# Patient Record
Sex: Female | Born: 1935 | Race: Black or African American | Hispanic: No | Smoking: Never smoker
Health system: Southern US, Community
[De-identification: ages and names within clinical notes are randomized; demographics above are authoritative.]

## PROBLEM LIST (undated history)

## (undated) DIAGNOSIS — G5603 Carpal tunnel syndrome, bilateral upper limbs: Secondary | ICD-10-CM

## (undated) DIAGNOSIS — E785 Hyperlipidemia, unspecified: Secondary | ICD-10-CM

## (undated) DIAGNOSIS — M1990 Unspecified osteoarthritis, unspecified site: Secondary | ICD-10-CM

## (undated) DIAGNOSIS — K5792 Diverticulitis of intestine, part unspecified, without perforation or abscess without bleeding: Secondary | ICD-10-CM

## (undated) DIAGNOSIS — N8110 Cystocele, unspecified: Secondary | ICD-10-CM

## (undated) DIAGNOSIS — E1142 Type 2 diabetes mellitus with diabetic polyneuropathy: Secondary | ICD-10-CM

## (undated) DIAGNOSIS — E119 Type 2 diabetes mellitus without complications: Secondary | ICD-10-CM

## (undated) DIAGNOSIS — I10 Essential (primary) hypertension: Secondary | ICD-10-CM

## (undated) HISTORY — PX: OTHER SURGICAL HISTORY: SHX169

## (undated) HISTORY — PX: REPLACEMENT TOTAL KNEE BILATERAL: SUR1225

## (undated) HISTORY — PX: VENTRAL HERNIA REPAIR: SHX424

---

## 2004-10-16 ENCOUNTER — Ambulatory Visit: Payer: Self-pay | Admitting: Unknown Physician Specialty

## 2005-06-28 ENCOUNTER — Ambulatory Visit: Payer: Self-pay | Admitting: Unknown Physician Specialty

## 2005-10-21 ENCOUNTER — Ambulatory Visit: Payer: Self-pay | Admitting: Unknown Physician Specialty

## 2006-10-27 ENCOUNTER — Ambulatory Visit: Payer: Self-pay | Admitting: Unknown Physician Specialty

## 2007-12-13 ENCOUNTER — Ambulatory Visit: Payer: Self-pay | Admitting: Unknown Physician Specialty

## 2008-01-03 ENCOUNTER — Ambulatory Visit: Payer: Self-pay | Admitting: Unknown Physician Specialty

## 2008-10-03 ENCOUNTER — Emergency Department: Payer: Self-pay | Admitting: Emergency Medicine

## 2008-10-11 ENCOUNTER — Ambulatory Visit: Payer: Self-pay | Admitting: Unknown Physician Specialty

## 2009-02-16 ENCOUNTER — Ambulatory Visit: Payer: Self-pay | Admitting: Unknown Physician Specialty

## 2009-11-05 ENCOUNTER — Ambulatory Visit: Payer: Self-pay | Admitting: General Practice

## 2009-11-21 ENCOUNTER — Inpatient Hospital Stay: Payer: Self-pay | Admitting: General Practice

## 2010-01-23 ENCOUNTER — Ambulatory Visit: Payer: Self-pay | Admitting: General Practice

## 2010-02-06 ENCOUNTER — Inpatient Hospital Stay: Payer: Self-pay | Admitting: General Practice

## 2010-05-08 ENCOUNTER — Ambulatory Visit: Payer: Self-pay | Admitting: Unknown Physician Specialty

## 2010-05-15 IMAGING — US US CAROTID DUPLEX BILAT
1 series · 17 of 24 positions shown · non-contrast
Comparison: none

REASON FOR EXAM: MRIat 930a  US at 6222a left facial numbness left hand
numbness  TIA
COMMENTS:

[Series 1: us carotid duplex bilat · 17 of 56 slices shown]
[im 1/56]
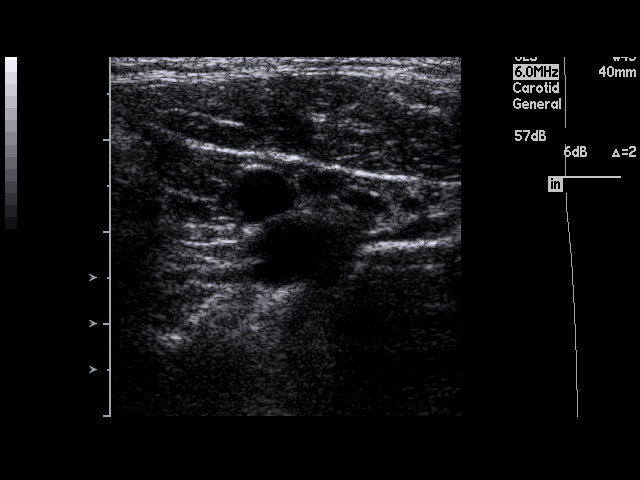
[im 5/56]
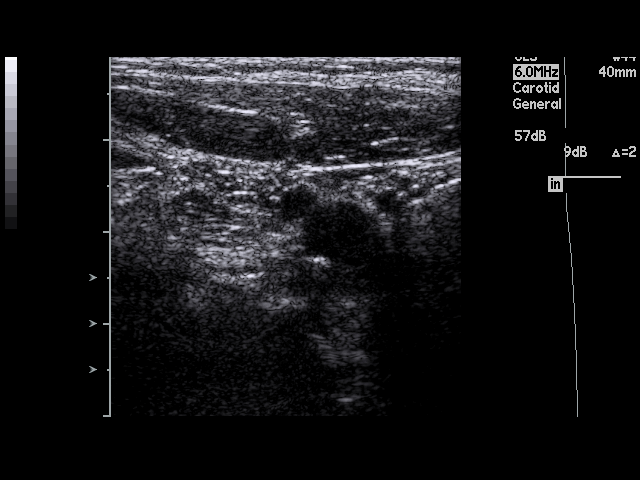
[im 8/56]
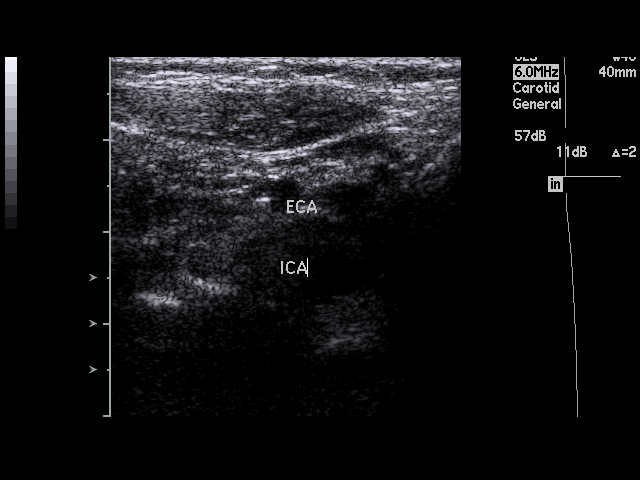
[im 10/56]
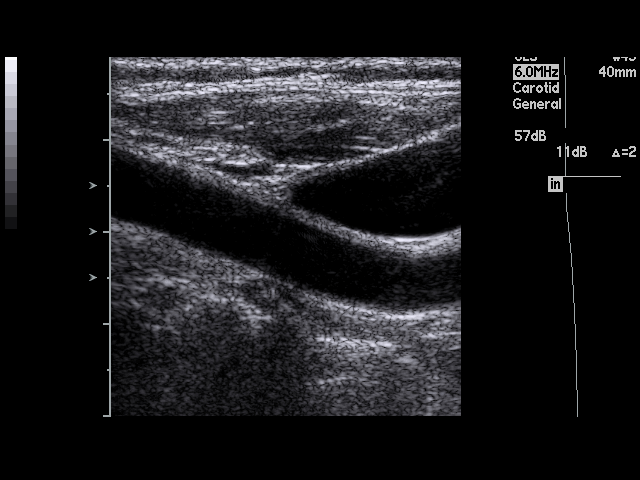
[im 15/56]
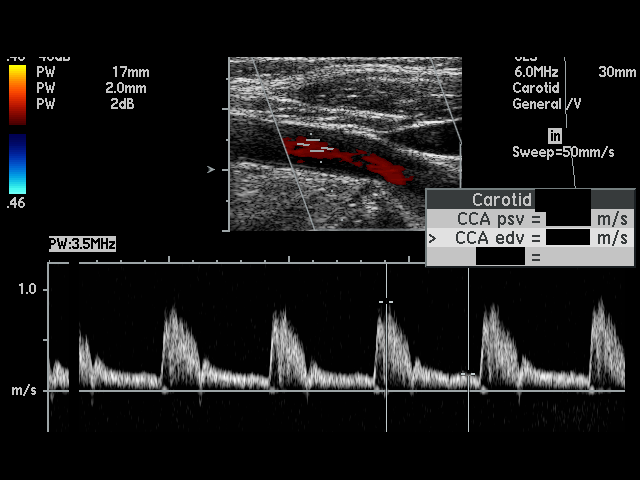
[im 17/56]
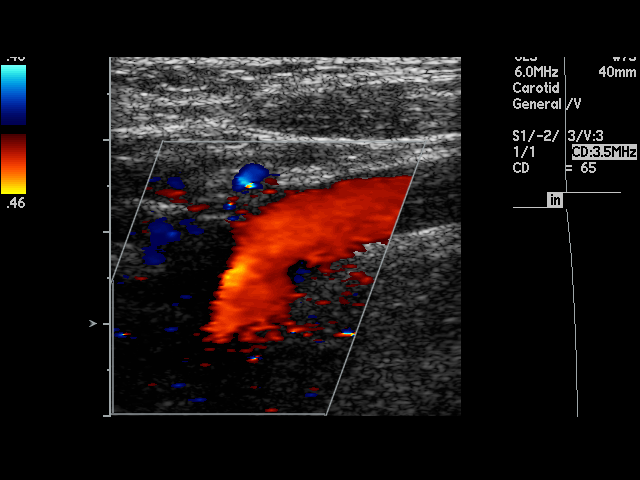
[im 22/56]
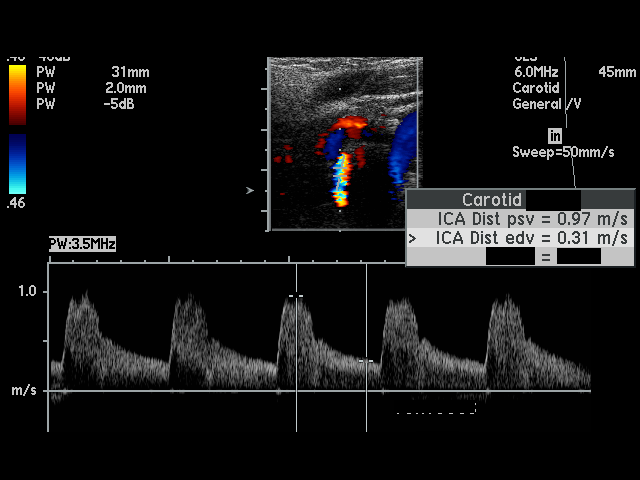
[im 24/56]
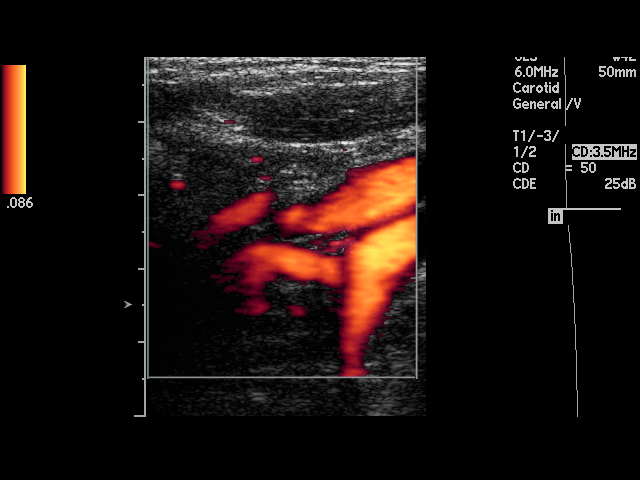
[im 29/56]
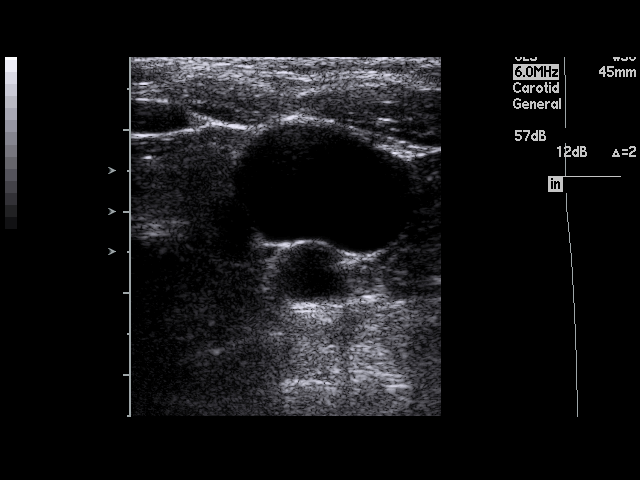
[im 32/56]
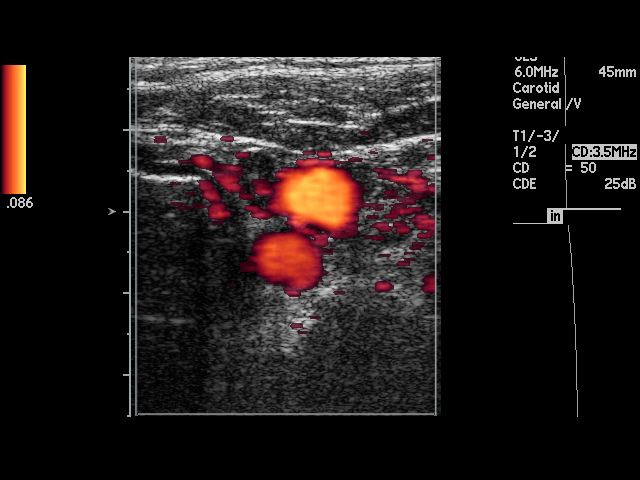
[im 34/56]
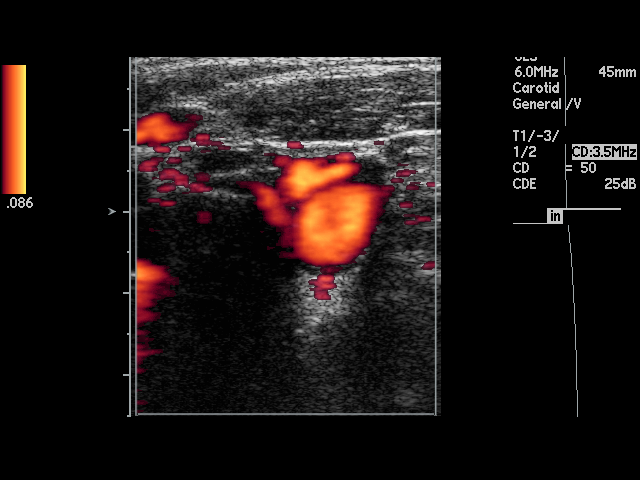
[im 39/56]
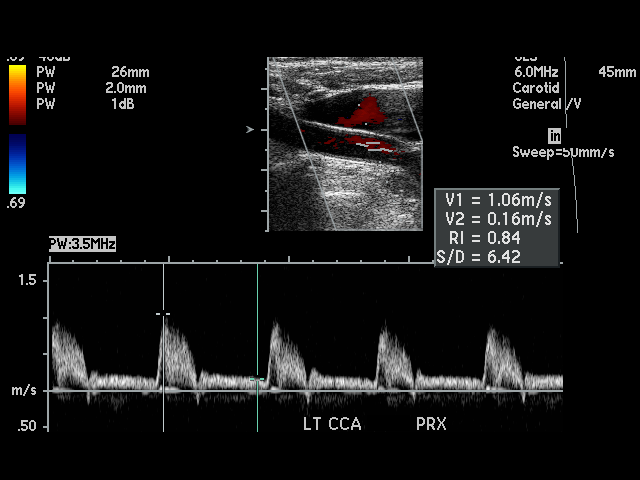
[im 41/56]
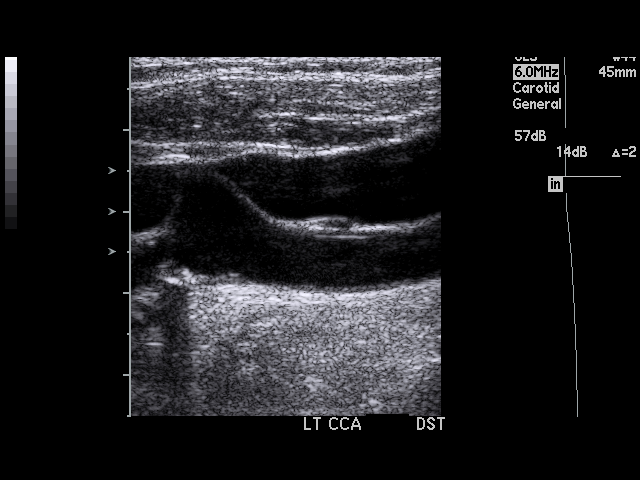
[im 46/56]
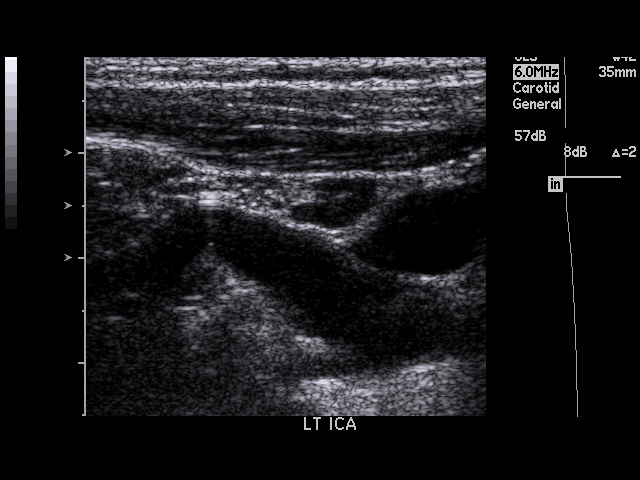
[im 48/56]
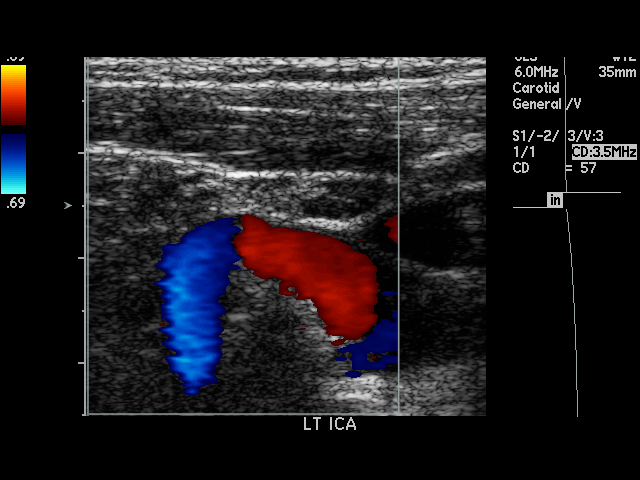
[im 51/56]
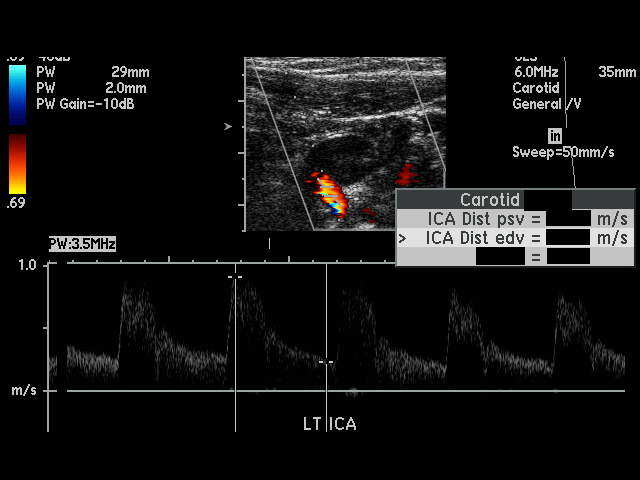
[im 56/56]
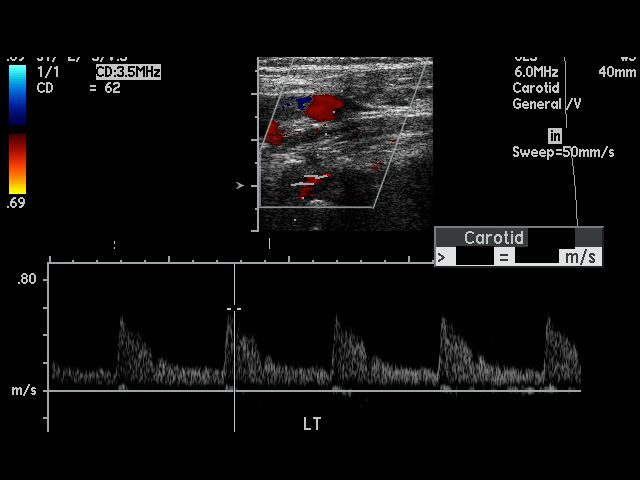

[17 of 24 positions shown; findings below may reference images not displayed]

PROCEDURE:     US  - US CAROTID DOPPLER BILATERAL  - October 11, 2008 [DATE]

RESULT:     There is observed mild plaque formation at the carotid
bifurcation on the right, primarily calcified plaque. On the left, there is
a small amount of mixed calcific and soft plaque formation at the carotid
bulb and proximal internal and external carotids.

There on the right, the peak right common carotid artery flow velocity
measures 0.887 meters/second and the peak right internal carotid artery flow
velocity measures 1.04 meters/second. ICA/CCA ratio measures 1.172. On the
left, the peak left common carotid artery flow velocity measures
meters/second and the peak left internal carotid artery flow velocity
measures are 0.913 meters/second. ICA/CCA ratio is 1.174. These values are
bilaterally consistent with the absence of hemodynamically significant
stenosis.

Antegrade flow is observed in both vertebrals.
IMPRESSION: 1. Mild plaque formation is seen bilaterally.
2. No hemodynamically significant stenosis is observed on either side.
3. There is antegrade flow in both vertebrals.

## 2010-06-20 ENCOUNTER — Emergency Department: Payer: Self-pay | Admitting: Emergency Medicine

## 2010-11-30 ENCOUNTER — Inpatient Hospital Stay: Payer: Self-pay | Admitting: Internal Medicine

## 2011-06-25 ENCOUNTER — Ambulatory Visit: Payer: Self-pay | Admitting: Unknown Physician Specialty

## 2012-11-25 ENCOUNTER — Ambulatory Visit: Payer: Self-pay | Admitting: Internal Medicine

## 2012-12-17 ENCOUNTER — Ambulatory Visit: Payer: Self-pay | Admitting: Unknown Physician Specialty

## 2013-05-20 ENCOUNTER — Ambulatory Visit: Payer: Self-pay | Admitting: Unknown Physician Specialty

## 2013-09-20 ENCOUNTER — Inpatient Hospital Stay: Payer: Self-pay | Admitting: Internal Medicine

## 2013-09-20 LAB — COMPREHENSIVE METABOLIC PANEL
ANION GAP: 9 (ref 7–16)
Albumin: 3.1 g/dL — ABNORMAL LOW (ref 3.4–5.0)
Alkaline Phosphatase: 102 U/L
BILIRUBIN TOTAL: 0.2 mg/dL (ref 0.2–1.0)
BUN: 14 mg/dL (ref 7–18)
CALCIUM: 8.8 mg/dL (ref 8.5–10.1)
CO2: 27 mmol/L (ref 21–32)
Chloride: 105 mmol/L (ref 98–107)
Creatinine: 0.88 mg/dL (ref 0.60–1.30)
Glucose: 280 mg/dL — ABNORMAL HIGH (ref 65–99)
Osmolality: 292 (ref 275–301)
Potassium: 4 mmol/L (ref 3.5–5.1)
SGOT(AST): 23 U/L (ref 15–37)
SGPT (ALT): 16 U/L
SODIUM: 141 mmol/L (ref 136–145)
Total Protein: 7 g/dL (ref 6.4–8.2)

## 2013-09-20 LAB — CBC
HCT: 35.2 % (ref 35.0–47.0)
HGB: 11.4 g/dL — ABNORMAL LOW (ref 12.0–16.0)
MCH: 32.1 pg (ref 26.0–34.0)
MCHC: 32.4 g/dL (ref 32.0–36.0)
MCV: 99 fL (ref 80–100)
Platelet: 243 10*3/uL (ref 150–440)
RBC: 3.55 10*6/uL — AB (ref 3.80–5.20)
RDW: 13.2 % (ref 11.5–14.5)
WBC: 10.5 10*3/uL (ref 3.6–11.0)

## 2013-09-20 LAB — HEMOGLOBIN
HGB: 8.2 g/dL — ABNORMAL LOW (ref 12.0–16.0)
HGB: 9.6 g/dL — ABNORMAL LOW (ref 12.0–16.0)

## 2013-09-21 LAB — CBC WITH DIFFERENTIAL/PLATELET
BASOS ABS: 0 10*3/uL (ref 0.0–0.1)
BASOS PCT: 0.5 %
EOS PCT: 3.2 %
Eosinophil #: 0.3 10*3/uL (ref 0.0–0.7)
HCT: 26.3 % — ABNORMAL LOW (ref 35.0–47.0)
HGB: 8.6 g/dL — ABNORMAL LOW (ref 12.0–16.0)
Lymphocyte #: 2.7 10*3/uL (ref 1.0–3.6)
Lymphocyte %: 25.9 %
MCH: 31.3 pg (ref 26.0–34.0)
MCHC: 32.6 g/dL (ref 32.0–36.0)
MCV: 96 fL (ref 80–100)
Monocyte #: 0.7 x10 3/mm (ref 0.2–0.9)
Monocyte %: 6.8 %
Neutrophil #: 6.7 10*3/uL — ABNORMAL HIGH (ref 1.4–6.5)
Neutrophil %: 63.6 %
Platelet: 164 10*3/uL (ref 150–440)
RBC: 2.73 10*6/uL — AB (ref 3.80–5.20)
RDW: 14.4 % (ref 11.5–14.5)
WBC: 10.5 10*3/uL (ref 3.6–11.0)

## 2013-09-21 LAB — BASIC METABOLIC PANEL
ANION GAP: 4 — AB (ref 7–16)
BUN: 9 mg/dL (ref 7–18)
CHLORIDE: 111 mmol/L — AB (ref 98–107)
CREATININE: 0.75 mg/dL (ref 0.60–1.30)
Calcium, Total: 7.8 mg/dL — ABNORMAL LOW (ref 8.5–10.1)
Co2: 27 mmol/L (ref 21–32)
EGFR (Non-African Amer.): >60
Glucose: 150 mg/dL — ABNORMAL HIGH (ref 65–99)
OSMOLALITY: 285 (ref 275–301)
POTASSIUM: 3.6 mmol/L (ref 3.5–5.1)
Sodium: 142 mmol/L (ref 136–145)

## 2013-09-21 LAB — MAGNESIUM: MAGNESIUM: 0.9 mg/dL — AB

## 2013-09-22 LAB — COMPREHENSIVE METABOLIC PANEL WITH GFR
Albumin: 2.3 g/dL — ABNORMAL LOW
Alkaline Phosphatase: 75 U/L
Anion Gap: 8
BUN: 6 mg/dL — ABNORMAL LOW
Bilirubin,Total: 0.3 mg/dL
Calcium, Total: 7.6 mg/dL — ABNORMAL LOW
Chloride: 109 mmol/L — ABNORMAL HIGH
Co2: 26 mmol/L
Creatinine: 0.67 mg/dL
EGFR (African American): >60
EGFR (Non-African Amer.): >60
Glucose: 205 mg/dL — ABNORMAL HIGH
Osmolality: 289
Potassium: 3.6 mmol/L
SGOT(AST): 19 U/L
SGPT (ALT): 13 U/L — ABNORMAL LOW
Sodium: 143 mmol/L
Total Protein: 5.4 g/dL — ABNORMAL LOW

## 2013-09-22 LAB — CBC WITH DIFFERENTIAL/PLATELET
Basophil #: 0 x10 3/mm 3
Basophil %: 0.5 %
Eosinophil #: 0.4 x10 3/mm 3
Eosinophil %: 3.7 %
HCT: 26.8 % — ABNORMAL LOW
HGB: 8.8 g/dL — ABNORMAL LOW
Lymphocyte %: 22.5 %
Lymphs Abs: 2.2 x10 3/mm 3
MCH: 31.5 pg
MCHC: 32.8 g/dL
MCV: 96 fL
Monocyte #: 0.7 "x10 3/mm "
Monocyte %: 7.1 %
Neutrophil #: 6.3 x10 3/mm 3
Neutrophil %: 66.2 %
Platelet: 163 x10 3/mm 3
RBC: 2.78 X10 6/mm 3 — ABNORMAL LOW
RDW: 14.2 %
WBC: 9.5 x10 3/mm 3

## 2013-09-22 LAB — MAGNESIUM: Magnesium: 1.7 mg/dL — ABNORMAL LOW

## 2013-09-23 LAB — CBC WITH DIFFERENTIAL/PLATELET
BASOS ABS: 0 10*3/uL (ref 0.0–0.1)
Basophil %: 0.5 %
EOS ABS: 0.4 10*3/uL (ref 0.0–0.7)
EOS PCT: 4.2 %
HCT: 25.3 % — ABNORMAL LOW (ref 35.0–47.0)
HGB: 8.6 g/dL — AB (ref 12.0–16.0)
Lymphocyte #: 2.2 10*3/uL (ref 1.0–3.6)
Lymphocyte %: 25.2 %
MCH: 32.6 pg (ref 26.0–34.0)
MCHC: 33.9 g/dL (ref 32.0–36.0)
MCV: 96 fL (ref 80–100)
MONO ABS: 0.7 x10 3/mm (ref 0.2–0.9)
Monocyte %: 7.5 %
NEUTROS ABS: 5.5 10*3/uL (ref 1.4–6.5)
Neutrophil %: 62.6 %
PLATELETS: 167 10*3/uL (ref 150–440)
RBC: 2.62 10*6/uL — ABNORMAL LOW (ref 3.80–5.20)
RDW: 13.7 % (ref 11.5–14.5)
WBC: 8.7 10*3/uL (ref 3.6–11.0)

## 2013-09-23 LAB — BASIC METABOLIC PANEL
Anion Gap: 2 — ABNORMAL LOW (ref 7–16)
BUN: 5 mg/dL — ABNORMAL LOW (ref 7–18)
CHLORIDE: 110 mmol/L — AB (ref 98–107)
CREATININE: 0.76 mg/dL (ref 0.60–1.30)
Calcium, Total: 7.7 mg/dL — ABNORMAL LOW (ref 8.5–10.1)
Co2: 29 mmol/L (ref 21–32)
EGFR (African American): >60
EGFR (Non-African Amer.): >60
Glucose: 174 mg/dL — ABNORMAL HIGH (ref 65–99)
Osmolality: 283 (ref 275–301)
Potassium: 3.5 mmol/L (ref 3.5–5.1)
Sodium: 141 mmol/L (ref 136–145)

## 2013-09-23 LAB — MAGNESIUM: MAGNESIUM: 1.4 mg/dL — AB

## 2013-09-24 LAB — CBC WITH DIFFERENTIAL/PLATELET
BASOS ABS: 0 10*3/uL (ref 0.0–0.1)
BASOS PCT: 0.5 %
Eosinophil #: 0.4 10*3/uL (ref 0.0–0.7)
Eosinophil %: 3.9 %
HCT: 27 % — ABNORMAL LOW (ref 35.0–47.0)
HGB: 9 g/dL — AB (ref 12.0–16.0)
LYMPHS PCT: 27.3 %
Lymphocyte #: 2.5 10*3/uL (ref 1.0–3.6)
MCH: 31.9 pg (ref 26.0–34.0)
MCHC: 33.3 g/dL (ref 32.0–36.0)
MCV: 96 fL (ref 80–100)
MONOS PCT: 7.6 %
Monocyte #: 0.7 x10 3/mm (ref 0.2–0.9)
NEUTROS ABS: 5.6 10*3/uL (ref 1.4–6.5)
NEUTROS PCT: 60.7 %
Platelet: 192 10*3/uL (ref 150–440)
RBC: 2.82 10*6/uL — ABNORMAL LOW (ref 3.80–5.20)
RDW: 13.8 % (ref 11.5–14.5)
WBC: 9.2 10*3/uL (ref 3.6–11.0)

## 2013-09-24 LAB — MAGNESIUM: Magnesium: 1.6 mg/dL — ABNORMAL LOW

## 2013-09-24 LAB — BASIC METABOLIC PANEL
Anion Gap: 8 (ref 7–16)
BUN: 7 mg/dL (ref 7–18)
CALCIUM: 7.7 mg/dL — AB (ref 8.5–10.1)
CREATININE: 0.77 mg/dL (ref 0.60–1.30)
Chloride: 108 mmol/L — ABNORMAL HIGH (ref 98–107)
Co2: 27 mmol/L (ref 21–32)
EGFR (African American): >60
EGFR (Non-African Amer.): >60
GLUCOSE: 222 mg/dL — AB (ref 65–99)
Osmolality: 290 (ref 275–301)
POTASSIUM: 3.6 mmol/L (ref 3.5–5.1)
SODIUM: 143 mmol/L (ref 136–145)

## 2013-09-25 LAB — HEMOGLOBIN: HGB: 8.6 g/dL — AB (ref 12.0–16.0)

## 2013-09-25 LAB — MAGNESIUM: Magnesium: 1.6 mg/dL — ABNORMAL LOW

## 2013-11-02 DIAGNOSIS — I10 Essential (primary) hypertension: Secondary | ICD-10-CM | POA: Insufficient documentation

## 2014-01-12 ENCOUNTER — Ambulatory Visit: Payer: Self-pay | Admitting: Internal Medicine

## 2014-06-03 NOTE — Consult Note (Signed)
Chief Complaint:  Subjective/Chief Complaint stable overnight, no hypotension, no rectal bleeding .  no n/v or abdominalpain.   VITAL SIGNS/ANCILLARY NOTES: **Vital Signs.:   12-Aug-15 13:00  Vital Signs Type Routine  Temperature Temperature (F) 97.9  Celsius 36.6  Temperature Source oral  Pulse Pulse 71  Respirations Respirations 15  Systolic BP Systolic BP 301  Diastolic BP (mmHg) Diastolic BP (mmHg) 76  Mean BP 98  Pulse Ox % Pulse Ox % 99  Pulse Ox Activity Level  At rest  Oxygen Delivery Room Air/ 21 %   Brief Assessment:  Cardiac Regular   Respiratory clear BS   Gastrointestinal details normal Soft  Nontender  Nondistended  No masses palpable  Bowel sounds normal   Lab Results: Routine Chem:  12-Aug-15 04:35   Glucose, Serum  150  BUN 9  Creatinine (comp) 0.75  Sodium, Serum 142  Potassium, Serum 3.6  Chloride, Serum  111  CO2, Serum 27  Calcium (Total), Serum  7.8  Anion Gap  4  Osmolality (calc) 285  eGFR (African American) >60  eGFR (Non-African American) >60 (eGFR values <51m/min/1.73 m2 may be an indication of chronic kidney disease (CKD). Calculated eGFR is useful in patients with stable renal function. The eGFR calculation will not be reliable in acutely ill patients when serum creatinine is changing rapidly. It is not useful in  patients on dialysis. The eGFR calculation may not be applicable to patients at the low and high extremes of body sizes, pregnant women, and vegetarians.)  Magnesium, Serum  0.9 (1.8-2.4 THERAPEUTIC RANGE: 4-7 mg/dL TOXIC: > 10 mg/dL  -----------------------)  Routine Hem:  11-Aug-15 06:01   Hemoglobin (CBC)  11.4    11:40   Hemoglobin (CBC)  8.2 (Result(s) reported on 20 Sep 2013 at 11:48AM.)    18:20   Hemoglobin (CBC)  9.6 (Result(s) reported on 20 Sep 2013 at 06:45PM.)  12-Aug-15 04:35   WBC (CBC) 10.5  RBC (CBC)  2.73  Hemoglobin (CBC)  8.6  Hematocrit (CBC)  26.3  Platelet Count (CBC) 164  MCV 96  MCH  31.3  MCHC 32.6  RDW 14.4  Neutrophil % 63.6  Lymphocyte % 25.9  Monocyte % 6.8  Eosinophil % 3.2  Basophil % 0.5  Neutrophil #  6.7  Lymphocyte # 2.7  Monocyte # 0.7  Eosinophil # 0.3  Basophil # 0.0 (Result(s) reported on 21 Sep 2013 at 05:00AM.)   Radiology Results: Nuclear Med:    11-Aug-15 12:52, GI Blood Loss Study - Nuc Med  GI Blood Loss Study - Nuc Med   REASON FOR EXAM:    Rectal bleeding  COMMENTS:       PROCEDURE: NM  - NM GI BLOOD LOSS STUDY  - Sep 20 2013 12:52PM     CLINICAL DATA:  Rectal bleeding beginning and midnight 09/19/2013.    EXAM:  NUCLEAR MEDICINE GASTROINTESTINAL BLEEDING SCAN    TECHNIQUE:  Sequential abdominal images were obtained following intravenous  administration of Tc-972mabeled red blood cells.    RADIOPHARMACEUTICALS:  19.0 mCi Tc-9966m-vitro labeled red cells.  COMPARISON:  None.    FINDINGS:  No evidence of active bleeding is seen. Source for rectal bleeding  is not identified. Vascular and soft tissue activity is  unremarkable.     IMPRESSION:  Source for rectal bleed is not identified      Electronically Signed    By: ThoInge RiseD.    On: 09/20/2013 13:15     Verified By:  Ramond Dial, M.D.,   Assessment/Plan:  Assessment/Plan:  Assessment 1) hematochezia, no repeat since yesterday am.  not other GI sx. Likely diverticular in nature.   Plan 1) advance diet tomorrow to full liquids, then low residue the next day as clinically feasible. no new recs   Electronic Signatures: Loistine Simas (MD)  (Signed 12-Aug-15 17:18)  Authored: Chief Complaint, VITAL SIGNS/ANCILLARY NOTES, Brief Assessment, Lab Results, Radiology Results, Assessment/Plan   Last Updated: 12-Aug-15 17:18 by Loistine Simas (MD)

## 2014-06-03 NOTE — Discharge Summary (Signed)
PATIENT NAME:  [name], [name] MR#:  [id number] DATE OF BIRTH:  Oct 11, 1933  DATE OF ADMISSION:  09/20/2013 DATE OF DISCHARGE:  09/25/2013  HISTORY OF PRESENT ILLNESS: [name] is a 81 year old, black lady, who presented to the Emergency Room after she passed a lot of bright red blood per rectum. She apparently had 5 episodes overnight. She was sent for a bleeding scan that did not show any active bleeding. She was admitted to the CCU because of unstable vital signs. At the time of admission, her hemoglobin was down to 8.2, but she was transfused.   The patient's past medical history, family history, social history, medications and allergies were reviewed from the consulting gastroenterologist's note as there was no history and physical on the chart at this time.   The patient was initially admitted to the ICU because of unstable vital signs, but after transfusion was transferred to the regular floor. Serial hemoglobins and hematocrits were followed. She was seen in consultation by GI, who did not recommend colonoscopyduring this current hospitalization as her hemoglobin stabilized. She was eventually ambulated without difficulty. Her diet was progressed.   DISCHARGE DIAGNOSES: 1. Acute anemia due to acute blood loss.  2. Acute blood loss due to diverticular bleed.   DISCHARGE MEDICATIONS:  1. Ecotrin 81 mg daily.  2. Lisinopril 10 mg daily.  3. Metformin 1000 mg 1 tablet in the morning and 1-1/2 tablets in the evening.  4. Hydrochlorothiazide 25 mg daily.  5. Glimepiride 4 mg b.i.d.  6. Mag-Ox 400 mg 1 tablet daily.   DISCHARGE DISPOSITION: The patient was discharged on a diabetic controlled diet with activity as tolerated. The patient's hemoglobin at the time of discharge is 8.6. She needs to be followed up by her PCP in approximately 1 to 2 weeks.    ____________________________ Letta PateJohn B. Danne HarborWalker III, MD jbw:[id number] D: 10/17/2013 10:58:46 ET T: 10/17/2013 11:46:17  ET JOB#: [id number]  cc: Letta PateJohn B. Danne HarborWalker III, MD, <Dictator> Elmo PuttJOHN B WALKER III MD ELECTRONICALLY SIGNED 10/18/2013 7:40

## 2014-06-03 NOTE — Consult Note (Signed)
PATIENT NAME:  [name], [name] MR#:  [id number] DATE OF BIRTH:  1933/09/01  DATE OF CONSULTATION:  09/20/2013  REFERRING PHYSICIAN:  Dr. Mliss Fritzawood [name]  CONSULTING PHYSICIAN:  [name] Barrehristiane H. Percival Glasheen, NP  REASON FOR CONSULTATION: For evaluation of bright red blood per rectum.  HISTORY OF PRESENT ILLNESS: I appreciate consult for this 81 year old pleasant African American woman with history of diabetes, hypertension, anemia, colon polyps, bilateral knee surgery, arthritis and hyperlipidemia who was admitted for bright red blood per rectum and for evaluation of the same. 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 She felt the urge to defecate, went to the commode and passed a large amount of bright red blood. Had about 5 episodes overnight and presented to the Emergency Department and was subsequently admitted. Had 2 other red bowel movements this morning at about 9:30, went for GI bleeding scan which did not demonstrate any active bleeding, was transferred to the CCU. Nursing staff reports when the patient arrived to the unit she had a movement with large amount of bright red blood with clots. This was about 1:45. Has had no bleeding since. The patient never had an event like this before. Denies abdominal pain, nausea, vomiting, diarrhea, problems swallowing or further GI complaints. Had dizzy spell this morning prior to the bleeding scan. This has resolved. Hemoglobin down to 8.2, transfusion planned for this afternoon. Had most recent colonoscopy 05/2012 with diverticulosis and internal hemorrhoids, done by Dr. Mechele [name]. Not on any significant anticoagulation therapy and denies regular NSAID use. She is hemodynamically stable.   PAST MEDICAL HISTORY: Hypertension, diabetes, anemia, hyperlipidemia, colon polyps, osteoarthritis, bilateral knee surgeries.   ALLERGIES: NKDA.   HOME MEDICATIONS: Lisinopril 10 mg p.o. daily, metformin 1 gram in the morning and 500 mg in the evening, ASA 81 mg p.o.  daily, HCTZ 25 mg p.o. daily, calcium with C b.i.d., glyburide 4 mg b.i.d., simvastatin 40 mg p.o. daily.   SOCIAL HISTORY: Denies tobacco, alcohol, illicits.   FAMILY HISTORY: Significant for diabetes and kidney cancer and breast cancer. No known colorectal cancer.   REVIEW OF SYSTEMS: Ten systems reviewed. Significant only for recent treatment of back pain, not having any currently, and otherwise unremarkable.   DIAGNOSTIC DATA: Most recent labs: Glucose 280, BUN 14, creatinine 0.88, sodium 141, potassium 4. GFR greater than 60. Total protein 7, albumin 3.1, total bilirubin 0.2, ALP 102, AST 23, ALT 16. WBC 10.5, hemoglobin 8.2, normal platelets, normal red cells with normal RDW.   PHYSICAL EXAMINATION:  MOST RECENT VITAL SIGNS: Temperature 97.7, heart rate 66, respiratory rate 14, blood pressure 112/56, SaO2 99% on room air.  GENERAL: Pleasant, well-appearing elderly woman resting in bed in no acute distress.  HEENT: Normocephalic, atraumatic. Conjunctivae somewhat pale. Sclerae are clear. Mucous membranes pink and moist.  NECK: Supple. No thyromegaly or JVD.  CHEST: Respirations eupneic. Lungs clear.  CARDIAC: S1, S2. RRR. No MRG. Sinus rhythm on the monitor. No appreciable edema.  ABDOMEN: Bowel sounds hypoactive x4. Soft, nondistended, nontender. No guarding, rigidity, peritoneal signs, hepatosplenomegaly or other abnormalities noted.  EXTREMITIES: No edema, clubbing or cyanosis.  SKIN: Warm, dry, pink. No erythema, lesion or rash.  PSYCHIATRIC: Pleasant, calm, cooperative, good judgment.  NEUROLOGICAL: Alert and oriented x3. Cranial nerves II through XII intact. Speech clear. Strength 5/5.  IMPRESSION AND PLAN: Rectal bleeding, suspect diverticular bleed. Discussed the exam findings and the usual course of this issue. Agree with transfusion and following hemoglobin. Should she have significant bleeding episode, will likely need to  repeat scan and consider further intervention. We will  follow with you for now and observe for further problems.   Thank you for this consult.   These services were provided by [name] Pat, MSN, NPC in collaboration with [name] Deem MD with whom I have discussed this patient in full.   ____________________________ [name] Barre, NP chl:sb D: 09/20/2013 15:10:58 ET T: 09/20/2013 15:29:06 ET JOB#: [id number]  cc: [name] Barre, NP, <Dictator> [name] Maize Decorian Schuenemann FNP ELECTRONICALLY SIGNED 09/21/2013 12:55

## 2014-06-03 NOTE — Consult Note (Signed)
PATIENT NAME:  [name], [name] MR#:  [id number] DATE OF BIRTH:  February 10, 1934  DATE OF CONSULTATION:  09/20/2013  REFERRING PHYSICIAN:   CONSULTING PHYSICIAN:  Starleen Armsawood S. Romie Tay, MD  ATTENDING PHYSICIAN: Dr. Sharyn CreamerMark Quale  PRIMARY CARE PHYSICIAN: Dr. Leotis ShamesJasmine Singh  PRIMARY GASTROENTEROLOGIST: Dr. Mechele CollinElliott  CHIEF COMPLAINT: Bright red blood per rectum.   HISTORY OF PRESENT ILLNESS: This is a pleasant, 81 year old female with known history of diabetes, hypertension, hyperlipidemia, who presents with complaints of bright red blood per rectum. The patient reports she had four episodes of large bowel movements over the last few hours after midnight. Stools were mixed with bright red blood per rectum. As well, the patient had another large episode in the ED as well. She denies any dizziness, any lightheadedness, any chest pain, any shortness of breath, any fatigue, weakness, or diaphoresis. The patient's hemoglobin was at 11.3, which seems to be around her baseline as well. [facility]7 [facility] as [facility]. The patient had a recent screening colonoscopy done by Dr. Mechele CollinElliott in April of this year, showing diverticulosis. The patient denies any history of upper or lower no gastrointestinal, no gastritis, no gastric ulcer disease. She is only on aspirin. Denies any NSAID use or any blood thinner use as well. So, hospitalist is requested to admit the patient.   PAST MEDICAL HISTORY:  1.  Hypertension.  2.  Diabetes mellitus.  3.  Anemia.  4.  Hyperlipidemia.  5.  Benign neoplasm of large bowel.  6.  Osteoarthrosis.  7.  Bilateral knee surgery.   ALLERGIES: No known drug allergies.   HOME MEDICATIONS: 1.  Lisinopril 10 mg oral daily.  2.  Metformin 1 gram oral in the morning and 1 tablet 500 mg in the evening.  3.  Aspirin 81 mg daily.  4.  Hydrochlorothiazide 25 mg oral daily.  5.  Calcium with vitamin D 1 tablet 2 times a day.  6.  Glyburide 4 mg oral 2 times a day.  7.   Simvastatin 40 mg oral daily.    SOCIAL HISTORY: The patient denies any smoking, alcohol, or illicit drug use. Has two children.   FAMILY HISTORY: Significant for diabetes mellitus and father had history of cancer in the kidneys.   REVIEW OF SYSTEMS: CONSTITUTIONAL: Denies fever, chills, fatigue, weakness, weight gain, weight loss.  EYES: Denies blurry vision, double vision or inflammation.  ENT: Denies tinnitus, ear pain, hearing loss. Denies epistaxis or discharge.  RESPIRATORY: Denies cough, wheezing, hemoptysis, dyspnea, chronic obstructive pulmonary disease.  CARDIOVASCULAR: Denies chest pain, edema, palpitations, syncope.  GASTROINTESTINAL: Denies nausea, vomiting, diarrhea, constipation, abdominal pain, melena. Reports bright red blood per rectum, so far five episodes over the last six hours.  GENITOURINARY: Denies dysuria, hematuria, renal colic.  ENDOCRINE: Denies polyuria, polydipsia, heat or cold intolerance.  HEMATOLOGY: Denies anemia, easy bruising, bleeding diathesis.  INTEGUMENT: Denies acne, rash, or skin lesion.  MUSCULOSKELETAL: Denies any swelling, gout, cramps. Reports history of arthritis.  NEUROLOGIC: Denies history of CVA, transient ischemic attack, seizures, headache, ataxia, vertigo, tremor.  PSYCHIATRIC: Denies anxiety, insomnia, or depression.   PHYSICAL EXAMINATION: VITAL SIGNS: Temperature 97.7, pulse 74, respiratory rate 15, blood pressure 140/72, saturating 98% on room air.  GENERAL: Well-nourished female who looks comfortable in bed, in no apparent distress.  HEENT: Head atraumatic, normocephalic. Pupils equal, reactive to light. Pink conjunctivae. Anicteric sclerae. Moist oral mucosa. No nasal discharge or bleed.  NECK: Supple. No thyromegaly. No JVD. No carotid bruits. No  lymphadenopathy.  CHEST: Good air entry bilaterally. No wheezing, rales or rhonchi.  CARDIOVASCULAR: S1, S2 heard. No rubs, murmurs, gallops. Regular rate and rhythm.  ABDOMEN: Soft,  nontender, nondistended. Bowel sounds present. No hepatosplenomegaly appreciated.  EXTREMITIES: No edema. No clubbing. No cyanosis. Pedal pulses +2 bilaterally. Good capillary refill.  SKIN: Normal skin turgor. Warm and dry.  PSYCHIATRIC: Appropriate affect. Awake, alert x3. Intact judgment and insight.  NEUROLOGIC: Cranial nerves grossly intact. Motor 5/5. No focal deficits.  MUSCULOSKELETAL: No joint effusion or erythema.   PERTINENT LABORATORIES: Glucose 280, BUN 14, creatinine 0.88, sodium 141, potassium 4, chloride 105, CO2 27. White blood cells 10.5, hemoglobin 11.4, hematocrit 35.2, platelets 243.   ASSESSMENT AND PLAN: 1.  Bright red blood per rectum. This is most likely related to lower gastrointestinal bleed, as she does not have any coffee-ground emesis. Blood is bright in the stool. This is most likely related to diverticular bleed. Will have her on Protonix 40 mg intravenous b.i.d. Will keep her on nothing by mouth. Will keep her on fluids. We will hold aspirin. We will consult gastroenterology. We will continue to monitor her hemoglobin and hematocrit every six hours. Will keep active type and screen and transfuse if needed.  2.  Hypertension. Blood pressure is acceptable. We will hold all medications until the patient is more stable.  3.  Diabetes mellitus, uncontrolled hyperglycemia. We will hold oral hypoglycemic agents as she is on nothing by mouth. Will keep her on insulin sliding scale.  4.  Hyperlipidemia. Continue with statin.  5.  Deep venous thrombosis prophylaxis. Sequential compression device and thromboembolic deterrent hose. Hold chemical anticoagulation due to her bleed.   CODE STATUS: The patient reports she is a full code. Does not have a living will. Reports her son and daughter have her healthcare power of attorney.   TOTAL TIME SPENT ON ADMISSION AND PATIENT CARE: 55 minutes.    ____________________________ Starleen Arms, MD dse:cg D: 09/20/2013 07:17:23  ET T: 09/20/2013 07:38:32 ET JOB#: [id number]  cc: Starleen Arms, MD, <Dictator> Aava Deland Teena Irani MD ELECTRONICALLY SIGNED 09/23/2013 13:14

## 2014-06-03 NOTE — Consult Note (Signed)
Chief Complaint:  Subjective/Chief Complaint seen for hematochezia.  no recurrance over 48 hours, tolerating full liquids. no v/n or abdominal pain.   VITAL SIGNS/ANCILLARY NOTES: **Vital Signs.:   13-Aug-15 15:36  Vital Signs Type Q 4hr  Celsius 36.5  Temperature Source oral  Pulse Pulse 75  Respirations Respirations 18  Systolic BP Systolic BP 676  Diastolic BP (mmHg) Diastolic BP (mmHg) 75  Mean BP 100  Pulse Ox % Pulse Ox % 97  Pulse Ox Activity Level  At rest  Oxygen Delivery Room Air/ 21 %   Brief Assessment:  Cardiac Regular   Respiratory clear BS   Gastrointestinal details normal Soft  Nontender  Nondistended  No masses palpable  Bowel sounds normal   Lab Results: Hepatic:  13-Aug-15 05:07   Bilirubin, Total 0.3  Alkaline Phosphatase 75 (46-116 NOTE: New Reference Range 08/30/13)  SGPT (ALT)  13 (14-63 NOTE: New Reference Range 08/30/13)  SGOT (AST) 19  Total Protein, Serum  5.4  Albumin, Serum  2.3  Routine Chem:  13-Aug-15 05:07   Magnesium, Serum  1.7 (1.8-2.4 THERAPEUTIC RANGE: 4-7 mg/dL TOXIC: > 10 mg/dL  -----------------------)  Glucose, Serum  205  BUN  6  Creatinine (comp) 0.67  Sodium, Serum 143  Potassium, Serum 3.6  Chloride, Serum  109  CO2, Serum 26  Calcium (Total), Serum  7.6  Osmolality (calc) 289  eGFR (African American) >60  eGFR (Non-African American) >60 (eGFR values <71m/min/1.73 m2 may be an indication of chronic kidney disease (CKD). Calculated eGFR is useful in patients with stable renal function. The eGFR calculation will not be reliable in acutely ill patients when serum creatinine is changing rapidly. It is not useful in  patients on dialysis. The eGFR calculation may not be applicable to patients at the low and high extremes of body sizes, pregnant women, and vegetarians.)  Anion Gap 8  Routine Hem:  11-Aug-15 06:01   Hemoglobin (CBC)  11.4    11:40   Hemoglobin (CBC)  8.2 (Result(s) reported on 20 Sep 2013 at  11:48AM.)    18:20   Hemoglobin (CBC)  9.6 (Result(s) reported on 20 Sep 2013 at 06:45PM.)  12-Aug-15 04:35   Hemoglobin (CBC)  8.6  13-Aug-15 05:07   WBC (CBC) 9.5  RBC (CBC)  2.78  Hemoglobin (CBC)  8.8  Hematocrit (CBC)  26.8  Platelet Count (CBC) 163  MCV 96  MCH 31.5  MCHC 32.8  RDW 14.2  Neutrophil % 66.2  Lymphocyte % 22.5  Monocyte % 7.1  Eosinophil % 3.7  Basophil % 0.5  Neutrophil # 6.3  Lymphocyte # 2.2  Monocyte # 0.7  Eosinophil # 0.4  Basophil # 0.0 (Result(s) reported on 22 Sep 2013 at 05:47AM.)   Assessment/Plan:  Assessment/Plan:  Assessment 1) hematochezia-likely diverticular bleeding.  not recurrent over 48 hours.   Plan 1) advance diet to low residue, will need GI fu as o/p.  I will not be here tomorrow.  If GI assistance is needed, Dr EVira Agarcovering./   Electronic Signatures: SLoistine Simas(MD)  (Signed 13-Aug-15 17:19)  Authored: Chief Complaint, VITAL SIGNS/ANCILLARY NOTES, Brief Assessment, Lab Results, Assessment/Plan   Last Updated: 13-Aug-15 17:19 by SLoistine Simas(MD)

## 2014-06-03 NOTE — Consult Note (Signed)
Chief Complaint:  Subjective/Chief Complaint Please see full Gi consult and brief consult note.  Patietn admittted with lower GI bleeding.  Episode of hypotension this am, with transfer to CCU.  No abdominal pain before the initial episode of hematochezia, or since.   GI bleeding scan negative this am, though last episode of bleeding was earlier this am.  No n/v or abdominal pain.  Impression-likely diverticular bleeding-colonoscopy done in 4 2015, with diverticulosis.  Continue serial cbc, transfuse as needed.  May have ice chips for now.  If ther is another bleeding episode associated with a further drop oc hgb, repeat bleeding scan and if positive, consult Vascular Surgery for possible microembolization.  Following.   VITAL SIGNS/ANCILLARY NOTES: **Vital Signs.:   11-Aug-15 15:00  Vital Signs Type Pre-Blood  Temperature Temperature (F) 97.7  Celsius 36.5  Pulse Pulse 60  Pulse source if not from Vital Sign Device per cardiac monitor  Respirations Respirations 14  Systolic BP Systolic BP 116  Diastolic BP (mmHg) Diastolic BP (mmHg) 54  Mean BP 73  BP Source  if not from Vital Sign Device non-invasive  Pulse Ox % Pulse Ox % 98  Pulse Ox Activity Level  At rest  Oxygen Delivery Room Air/ 21 %   Brief Assessment:  Cardiac Regular   Respiratory clear BS   Gastrointestinal details normal Soft  Nontender  Nondistended  No masses palpable  Bowel sounds normal   Lab Results: Routine Chem:  11-Aug-15 06:01   BUN 14  Routine Hem:  11-Aug-15 06:01   Hemoglobin (CBC)  11.4  Platelet Count (CBC) 243 (Result(s) reported on 20 Sep 2013 at 06:38AM.)    11:40   Hemoglobin (CBC)  8.2 (Result(s) reported on 20 Sep 2013 at 11:48AM.)   Radiology Results: Nuclear Med:    11-Aug-15 12:52, GI Blood Loss Study - Nuc Med  GI Blood Loss Study - Nuc Med   REASON FOR EXAM:    Rectal bleeding  COMMENTS:       PROCEDURE: NM  - NM GI BLOOD LOSS STUDY  - Sep 20 2013 12:52PM     CLINICAL DATA:   Rectal bleeding beginning and midnight 09/19/2013.    EXAM:  NUCLEAR MEDICINE GASTROINTESTINAL BLEEDING SCAN    TECHNIQUE:  Sequential abdominal images were obtained following intravenous  administration of Tc-1572m labeled red blood cells.    RADIOPHARMACEUTICALS:  19.0 mCi Tc-972m in-vitro labeled red cells.  COMPARISON:  None.    FINDINGS:  No evidence of active bleeding is seen. Source for rectal bleeding  is not identified. Vascular and soft tissue activity is  unremarkable.     IMPRESSION:  Source for rectal bleed is not identified      Electronically Signed    By: Drusilla Kannerhomas  Dalessio M.D.    On: 09/20/2013 13:15     Verified By: Charyl DancerHOMAS L. D ALESSIO, M.D.,   Electronic Signatures: Barnetta ChapelSkulskie, Emon Miggins (MD)  (Signed 11-Aug-15 16:00)  Authored: Chief Complaint, VITAL SIGNS/ANCILLARY NOTES, Brief Assessment, Lab Results, Radiology Results   Last Updated: 11-Aug-15 16:00 by Barnetta ChapelSkulskie, Kijana Estock (MD)

## 2014-06-03 NOTE — Consult Note (Signed)
Brief Consult Note: Diagnosis: brpr.   Comments: went to see patient: currently in nuclear medicine having bleeding scan: bp up to 117/50, patient denies pain. No reports of further bleeding-- hgb 8.2, have discussed with Dr Elpidio AnisSudini who states he will order xfusion.  Will complete consult when test done..  Electronic Signatures: Vevelyn PatLondon, Lidia Clavijo H (NP)  (Signed 11-Aug-15 12:33)  Authored: Brief Consult Note   Last Updated: 11-Aug-15 12:33 by Keturah BarreLondon, Dora Clauss H (NP)

## 2014-06-03 NOTE — Consult Note (Signed)
Brief Consult Note: Diagnosis: rectal  bleeding.   Patient was seen by consultant.   Consult note dictated.   Comments: Appreciate consult for very pleaseant 81 y/o [place] woman with history of DM, htn, HL, anemia, who was admitted with brpr, for evaluation of the same. States that she was folding laundry last night, felt the urge to defecate, went to the commode and passed a large amount of bright red blood. Had about 5 episodes overnight and presented to the ED, was subsequently admitted. Had 2 other red bowel movments this am about 0930- went for GIB scan, which did not demonstrate any active bleeding, and transferred to CCU. Nsg staff reports when patient arrived to unit, she had a  mvt w/ large amout of bright red blood with clots- this was about 1345, but none since. Pt never had event like this before. Denies abdominal pain, NVD, problems swallowing, further GI complaints. Had dizzy spell this am prior to bldg scan, resolved. Hgb down to 8.2, xfusion planned this pm. Had colonoscopy 4/14 with diverticulosis, done by Dr Markham JordanElliot. Not on any significant anticoagulation therapy. Denies regular NSAID use. Abdominal exam benign, hemodynamically stable. GIB scan did not demonstrate any active bleeding. Impression and plan. Rectal bleeding. Suspect diverticular bleed. Discussed this, exam findings, and usual course of this issue. Agree with transfusion, and following hgb. Should she have significant bleeding episode, will likely need to repeat scan, consider further intervention. Will follow with you for now, observe for further problems.  Electronic Signatures: Vevelyn PatLondon, Kieley Akter H (NP)  (Signed 11-Aug-15 15:02)  Authored: Brief Consult Note   Last Updated: 11-Aug-15 15:02 by Keturah BarreLondon, Elnora Quizon H (NP)

## 2014-08-07 ENCOUNTER — Other Ambulatory Visit: Payer: Self-pay | Admitting: Radiology

## 2014-08-07 DIAGNOSIS — Z1231 Encounter for screening mammogram for malignant neoplasm of breast: Secondary | ICD-10-CM

## 2014-11-06 DIAGNOSIS — E782 Mixed hyperlipidemia: Secondary | ICD-10-CM | POA: Insufficient documentation

## 2015-01-15 ENCOUNTER — Ambulatory Visit: Payer: Self-pay

## 2015-01-24 ENCOUNTER — Ambulatory Visit: Payer: Self-pay

## 2015-01-24 ENCOUNTER — Ambulatory Visit
Admission: RE | Admit: 2015-01-24 | Discharge: 2015-01-24 | Disposition: A | Discharge status: Home / Self Care | Payer: Medicare Other | Source: Ambulatory Visit | Attending: Internal Medicine | Admitting: Internal Medicine

## 2015-01-24 DIAGNOSIS — Z1231 Encounter for screening mammogram for malignant neoplasm of breast: Secondary | ICD-10-CM

## 2015-06-27 ENCOUNTER — Other Ambulatory Visit: Payer: Self-pay | Admitting: Internal Medicine

## 2015-06-27 DIAGNOSIS — M2550 Pain in unspecified joint: Secondary | ICD-10-CM | POA: Insufficient documentation

## 2015-06-27 DIAGNOSIS — M7989 Other specified soft tissue disorders: Secondary | ICD-10-CM

## 2015-06-28 ENCOUNTER — Ambulatory Visit
Admission: RE | Admit: 2015-06-28 | Discharge: 2015-06-28 | Disposition: A | Discharge status: Home / Self Care | Payer: Medicare Other | Source: Ambulatory Visit | Attending: Internal Medicine | Admitting: Internal Medicine

## 2015-06-28 ENCOUNTER — Ambulatory Visit: Admission: RE | Admit: 2015-06-28 | Payer: Medicare Other | Source: Ambulatory Visit

## 2015-06-28 ENCOUNTER — Other Ambulatory Visit: Payer: Self-pay | Admitting: Internal Medicine

## 2015-06-28 DIAGNOSIS — M7989 Other specified soft tissue disorders: Secondary | ICD-10-CM | POA: Diagnosis not present

## 2015-06-28 DIAGNOSIS — M19041 Primary osteoarthritis, right hand: Secondary | ICD-10-CM | POA: Diagnosis not present

## 2015-06-28 HISTORY — DX: Essential (primary) hypertension: I10

## 2015-06-28 MED ORDER — IOPAMIDOL (ISOVUE-300) INJECTION 61%
75.0000 mL | Freq: Once | INTRAVENOUS | Status: AC | PRN
  Administered 2015-06-28: 75 mL via INTRAVENOUS

## 2015-07-10 DIAGNOSIS — M8589 Other specified disorders of bone density and structure, multiple sites: Secondary | ICD-10-CM | POA: Insufficient documentation

## 2015-07-10 DIAGNOSIS — G5603 Carpal tunnel syndrome, bilateral upper limbs: Secondary | ICD-10-CM | POA: Insufficient documentation

## 2015-07-23 ENCOUNTER — Ambulatory Visit: Payer: Medicare Other | Admitting: Occupational Therapy

## 2015-08-06 DIAGNOSIS — E1142 Type 2 diabetes mellitus with diabetic polyneuropathy: Secondary | ICD-10-CM | POA: Insufficient documentation

## 2016-01-13 ENCOUNTER — Encounter
Admission: EM | Disposition: A | Discharge status: Home / Self Care | Payer: Self-pay | Source: Home / Self Care | Attending: Surgery

## 2016-01-13 ENCOUNTER — Emergency Department: Payer: Medicare Other

## 2016-01-13 ENCOUNTER — Encounter: Payer: Self-pay | Admitting: Emergency Medicine

## 2016-01-13 ENCOUNTER — Inpatient Hospital Stay: Payer: Medicare Other

## 2016-01-13 ENCOUNTER — Inpatient Hospital Stay
Admission: EM | Admit: 2016-01-13 | Discharge: 2016-01-25 | DRG: 329 | Disposition: A | Discharge status: Home / Self Care | Payer: Medicare Other | Attending: Surgery | Admitting: Surgery

## 2016-01-13 DIAGNOSIS — M6281 Muscle weakness (generalized): Secondary | ICD-10-CM

## 2016-01-13 DIAGNOSIS — K913 Postprocedural intestinal obstruction, unspecified as to partial versus complete: Secondary | ICD-10-CM | POA: Diagnosis not present

## 2016-01-13 DIAGNOSIS — K567 Ileus, unspecified: Secondary | ICD-10-CM

## 2016-01-13 DIAGNOSIS — E785 Hyperlipidemia, unspecified: Secondary | ICD-10-CM | POA: Diagnosis present

## 2016-01-13 DIAGNOSIS — K659 Peritonitis, unspecified: Secondary | ICD-10-CM | POA: Diagnosis present

## 2016-01-13 DIAGNOSIS — I10 Essential (primary) hypertension: Secondary | ICD-10-CM | POA: Diagnosis present

## 2016-01-13 DIAGNOSIS — E876 Hypokalemia: Secondary | ICD-10-CM | POA: Diagnosis present

## 2016-01-13 DIAGNOSIS — E860 Dehydration: Secondary | ICD-10-CM | POA: Diagnosis present

## 2016-01-13 DIAGNOSIS — R000 Tachycardia, unspecified: Secondary | ICD-10-CM | POA: Diagnosis present

## 2016-01-13 DIAGNOSIS — Y92239 Unspecified place in hospital as the place of occurrence of the external cause: Secondary | ICD-10-CM | POA: Diagnosis present

## 2016-01-13 DIAGNOSIS — K429 Umbilical hernia without obstruction or gangrene: Secondary | ICD-10-CM | POA: Diagnosis present

## 2016-01-13 DIAGNOSIS — K55029 Acute infarction of small intestine, extent unspecified: Secondary | ICD-10-CM | POA: Diagnosis present

## 2016-01-13 DIAGNOSIS — Z79899 Other long term (current) drug therapy: Secondary | ICD-10-CM | POA: Diagnosis not present

## 2016-01-13 DIAGNOSIS — E1310 Other specified diabetes mellitus with ketoacidosis without coma: Secondary | ICD-10-CM | POA: Diagnosis not present

## 2016-01-13 DIAGNOSIS — M1990 Unspecified osteoarthritis, unspecified site: Secondary | ICD-10-CM | POA: Diagnosis present

## 2016-01-13 DIAGNOSIS — K439 Ventral hernia without obstruction or gangrene: Secondary | ICD-10-CM | POA: Diagnosis present

## 2016-01-13 DIAGNOSIS — Z9289 Personal history of other medical treatment: Secondary | ICD-10-CM

## 2016-01-13 DIAGNOSIS — R062 Wheezing: Secondary | ICD-10-CM | POA: Diagnosis present

## 2016-01-13 DIAGNOSIS — E1142 Type 2 diabetes mellitus with diabetic polyneuropathy: Secondary | ICD-10-CM | POA: Diagnosis present

## 2016-01-13 DIAGNOSIS — R262 Difficulty in walking, not elsewhere classified: Secondary | ICD-10-CM

## 2016-01-13 DIAGNOSIS — Z96653 Presence of artificial knee joint, bilateral: Secondary | ICD-10-CM | POA: Diagnosis present

## 2016-01-13 DIAGNOSIS — Z7982 Long term (current) use of aspirin: Secondary | ICD-10-CM

## 2016-01-13 DIAGNOSIS — E1110 Type 2 diabetes mellitus with ketoacidosis without coma: Secondary | ICD-10-CM | POA: Diagnosis present

## 2016-01-13 DIAGNOSIS — R1110 Vomiting, unspecified: Secondary | ICD-10-CM

## 2016-01-13 DIAGNOSIS — E440 Moderate protein-calorie malnutrition: Secondary | ICD-10-CM | POA: Diagnosis present

## 2016-01-13 DIAGNOSIS — Y838 Other surgical procedures as the cause of abnormal reaction of the patient, or of later complication, without mention of misadventure at the time of the procedure: Secondary | ICD-10-CM | POA: Diagnosis not present

## 2016-01-13 DIAGNOSIS — R05 Cough: Secondary | ICD-10-CM

## 2016-01-13 DIAGNOSIS — K436 Other and unspecified ventral hernia with obstruction, without gangrene: Secondary | ICD-10-CM | POA: Diagnosis present

## 2016-01-13 DIAGNOSIS — E1165 Type 2 diabetes mellitus with hyperglycemia: Secondary | ICD-10-CM | POA: Diagnosis present

## 2016-01-13 DIAGNOSIS — K56609 Unspecified intestinal obstruction, unspecified as to partial versus complete obstruction: Secondary | ICD-10-CM

## 2016-01-13 DIAGNOSIS — N179 Acute kidney failure, unspecified: Secondary | ICD-10-CM | POA: Diagnosis present

## 2016-01-13 DIAGNOSIS — Z4659 Encounter for fitting and adjustment of other gastrointestinal appliance and device: Secondary | ICD-10-CM

## 2016-01-13 DIAGNOSIS — Z6826 Body mass index (BMI) 26.0-26.9, adult: Secondary | ICD-10-CM

## 2016-01-13 DIAGNOSIS — K460 Unspecified abdominal hernia with obstruction, without gangrene: Secondary | ICD-10-CM | POA: Diagnosis not present

## 2016-01-13 DIAGNOSIS — Z95828 Presence of other vascular implants and grafts: Secondary | ICD-10-CM

## 2016-01-13 DIAGNOSIS — R059 Cough, unspecified: Secondary | ICD-10-CM

## 2016-01-13 DIAGNOSIS — R109 Unspecified abdominal pain: Secondary | ICD-10-CM

## 2016-01-13 HISTORY — DX: Type 2 diabetes mellitus without complications: E11.9

## 2016-01-13 HISTORY — DX: Diverticulitis of intestine, part unspecified, without perforation or abscess without bleeding: K57.92

## 2016-01-13 HISTORY — DX: Unspecified osteoarthritis, unspecified site: M19.90

## 2016-01-13 HISTORY — PX: VENTRAL HERNIA REPAIR: SHX424

## 2016-01-13 HISTORY — DX: Hyperlipidemia, unspecified: E78.5

## 2016-01-13 HISTORY — DX: Carpal tunnel syndrome, bilateral upper limbs: G56.03

## 2016-01-13 HISTORY — DX: Type 2 diabetes mellitus with diabetic polyneuropathy: E11.42

## 2016-01-13 LAB — CBC
HEMATOCRIT: 44.6 % (ref 35.0–47.0)
Hemoglobin: 14.9 g/dL (ref 12.0–16.0)
MCH: 30.8 pg (ref 26.0–34.0)
MCHC: 33.4 g/dL (ref 32.0–36.0)
MCV: 92.4 fL (ref 80.0–100.0)
Platelets: 373 10*3/uL (ref 150–440)
RBC: 4.83 MIL/uL (ref 3.80–5.20)
RDW: 14 % (ref 11.5–14.5)
WBC: 12.1 10*3/uL — AB (ref 3.6–11.0)

## 2016-01-13 LAB — BASIC METABOLIC PANEL
Anion gap: 13 (ref 5–15)
BUN: 37 mg/dL — ABNORMAL HIGH (ref 6–20)
CALCIUM: 8.9 mg/dL (ref 8.9–10.3)
CO2: 29 mmol/L (ref 22–32)
CREATININE: 1.55 mg/dL — AB (ref 0.44–1.00)
Chloride: 93 mmol/L — ABNORMAL LOW (ref 101–111)
GFR calc non Af Amer: 31 mL/min — ABNORMAL LOW (ref >60)
GFR, EST AFRICAN AMERICAN: 36 mL/min — AB (ref >60)
Glucose, Bld: 258 mg/dL — ABNORMAL HIGH (ref 65–99)
Potassium: 2.9 mmol/L — ABNORMAL LOW (ref 3.5–5.1)
SODIUM: 135 mmol/L (ref 135–145)

## 2016-01-13 LAB — COMPREHENSIVE METABOLIC PANEL
ALT: 16 U/L (ref 14–54)
ANION GAP: 18 — AB (ref 5–15)
AST: 19 U/L (ref 15–41)
Albumin: 4.2 g/dL (ref 3.5–5.0)
Alkaline Phosphatase: 98 U/L (ref 38–126)
BUN: 38 mg/dL — AB (ref 6–20)
CHLORIDE: 83 mmol/L — AB (ref 101–111)
CO2: 30 mmol/L (ref 22–32)
Calcium: 10.1 mg/dL (ref 8.9–10.3)
Creatinine, Ser: 1.87 mg/dL — ABNORMAL HIGH (ref 0.44–1.00)
GFR, EST AFRICAN AMERICAN: 28 mL/min — AB (ref >60)
GFR, EST NON AFRICAN AMERICAN: 24 mL/min — AB (ref >60)
Glucose, Bld: 516 mg/dL (ref 65–99)
POTASSIUM: 3.5 mmol/L (ref 3.5–5.1)
Sodium: 131 mmol/L — ABNORMAL LOW (ref 135–145)
TOTAL PROTEIN: 8.6 g/dL — AB (ref 6.5–8.1)
Total Bilirubin: 0.7 mg/dL (ref 0.3–1.2)

## 2016-01-13 LAB — GLUCOSE, CAPILLARY
GLUCOSE-CAPILLARY: 401 mg/dL — AB (ref 65–99)
GLUCOSE-CAPILLARY: 261 mg/dL — AB (ref 65–99)
Glucose-Capillary: 309 mg/dL — ABNORMAL HIGH (ref 65–99)
Glucose-Capillary: 185 mg/dL — ABNORMAL HIGH (ref 65–99)
Glucose-Capillary: 149 mg/dL — ABNORMAL HIGH (ref 65–99)
Glucose-Capillary: 142 mg/dL — ABNORMAL HIGH (ref 65–99)

## 2016-01-13 LAB — BETA-HYDROXYBUTYRIC ACID: Beta-Hydroxybutyric Acid: 1.47 mmol/L — ABNORMAL HIGH (ref 0.05–0.27)

## 2016-01-13 LAB — MRSA PCR SCREENING: MRSA BY PCR: NEGATIVE

## 2016-01-13 SURGERY — REPAIR, HERNIA, VENTRAL
Anesthesia: General | Wound class: Clean Contaminated

## 2016-01-13 MED ORDER — HEPARIN SODIUM (PORCINE) 5000 UNIT/ML IJ SOLN: 5000.0000 [IU] | Freq: Three times a day (TID) | INTRAMUSCULAR | Status: DC

## 2016-01-13 MED ORDER — SODIUM CHLORIDE 0.9 % IV SOLN
INTRAVENOUS | Status: AC
  Administered 2016-01-13: 3.4 [IU]/h via INTRAVENOUS
  Filled 2016-01-13: qty 2.5

## 2016-01-13 MED ORDER — SODIUM CHLORIDE 0.9 % IV SOLN
INTRAVENOUS | Status: DC
  Administered 2016-01-14: via INTRAVENOUS

## 2016-01-13 MED ORDER — ONDANSETRON 4 MG PO TBDP
4.0000 mg | ORAL_TABLET | Freq: Four times a day (QID) | ORAL | Status: DC | PRN
  Administered 2016-01-14 – 2016-01-15 (×2): 4 mg via ORAL
  Filled 2016-01-13 (×2): qty 1

## 2016-01-13 MED ORDER — SODIUM CHLORIDE 0.9 % IV BOLUS (SEPSIS)
1000.0000 mL | Freq: Once | INTRAVENOUS | Status: AC
  Administered 2016-01-13: 1000 mL via INTRAVENOUS

## 2016-01-13 MED ORDER — ONDANSETRON HCL 4 MG/2ML IJ SOLN: 4.0000 mg | Freq: Four times a day (QID) | INTRAMUSCULAR | Status: DC | PRN

## 2016-01-13 MED ORDER — ONDANSETRON HCL 4 MG/2ML IJ SOLN
4.0000 mg | Freq: Once | INTRAMUSCULAR | Status: AC
  Administered 2016-01-13: 4 mg via INTRAVENOUS
  Filled 2016-01-13: qty 2

## 2016-01-13 MED ORDER — METOPROLOL TARTRATE 5 MG/5ML IV SOLN: 5.0000 mg | INTRAVENOUS | Status: DC | PRN

## 2016-01-13 MED ORDER — KCL IN DEXTROSE-NACL 20-5-0.45 MEQ/L-%-% IV SOLN
INTRAVENOUS | Status: DC
Start: 2016-01-13 — End: 2016-01-13
  Administered 2016-01-13: 20:00:00 via INTRAVENOUS
  Filled 2016-01-13 (×3): qty 1000

## 2016-01-13 MED ORDER — PANTOPRAZOLE SODIUM 40 MG IV SOLR: 40.0000 mg | Freq: Every day | INTRAVENOUS | Status: DC

## 2016-01-13 MED ORDER — HYDROMORPHONE HCL 1 MG/ML IJ SOLN: 0.5000 mg | INTRAMUSCULAR | Status: DC | PRN

## 2016-01-13 MED ORDER — DEXTROSE-NACL 5-0.45 % IV SOLN: INTRAVENOUS | Status: DC

## 2016-01-13 MED ORDER — HYDRALAZINE HCL 20 MG/ML IJ SOLN: 10.0000 mg | Freq: Four times a day (QID) | INTRAMUSCULAR | Status: DC | PRN

## 2016-01-13 MED ORDER — SODIUM CHLORIDE 0.9 % IV SOLN: INTRAVENOUS | Status: DC

## 2016-01-13 MED ORDER — SODIUM CHLORIDE 0.9 % IV SOLN
30.0000 meq | Freq: Once | INTRAVENOUS | Status: AC
  Administered 2016-01-13: 30 meq via INTRAVENOUS
  Filled 2016-01-13: qty 15

## 2016-01-13 MED ORDER — MORPHINE SULFATE (PF) 4 MG/ML IV SOLN
4.0000 mg | Freq: Once | INTRAVENOUS | Status: AC
  Administered 2016-01-13: 4 mg via INTRAVENOUS
  Filled 2016-01-13: qty 1

## 2016-01-13 MED ORDER — POTASSIUM CHLORIDE 20 MEQ/15ML (10%) PO SOLN
40.0000 meq | Freq: Once | ORAL | Status: AC
  Administered 2016-01-13: 40 meq
  Filled 2016-01-13 (×2): qty 30

## 2016-01-13 MED ORDER — INSULIN ASPART 100 UNIT/ML ~~LOC~~ SOLN
0.0000 [IU] | SUBCUTANEOUS | Status: DC
  Administered 2016-01-14: 3 [IU] via SUBCUTANEOUS
  Administered 2016-01-14 (×4): 2 [IU] via SUBCUTANEOUS
  Administered 2016-01-14: 3 [IU] via SUBCUTANEOUS
  Administered 2016-01-15 – 2016-01-16 (×2): 1 [IU] via SUBCUTANEOUS
  Administered 2016-01-17: 2 [IU] via SUBCUTANEOUS
  Administered 2016-01-17: 5 [IU] via SUBCUTANEOUS
  Administered 2016-01-17 (×2): 2 [IU] via SUBCUTANEOUS
  Filled 2016-01-13 (×2): qty 2
  Filled 2016-01-13: qty 3
  Filled 2016-01-13: qty 5
  Filled 2016-01-13 (×2): qty 2
  Filled 2016-01-13 (×2): qty 1
  Filled 2016-01-13: qty 2
  Filled 2016-01-13: qty 3
  Filled 2016-01-13 (×2): qty 2

## 2016-01-13 MED ORDER — PANTOPRAZOLE SODIUM 40 MG IV SOLR
40.0000 mg | INTRAVENOUS | Status: DC
  Administered 2016-01-13 – 2016-01-21 (×9): 40 mg via INTRAVENOUS
  Filled 2016-01-13 (×9): qty 40

## 2016-01-13 SURGICAL SUPPLY — 31 items
CANISTER SUCT 1200ML W/VALVE (MISCELLANEOUS) ×3 IMPLANT
CHLORAPREP W/TINT 26ML (MISCELLANEOUS) ×3 IMPLANT
DRAPE LAPAROTOMY 100X77 ABD (DRAPES) ×3 IMPLANT
DRSG OPSITE POSTOP 4X8 (GAUZE/BANDAGES/DRESSINGS) ×3 IMPLANT
ELECT REM PT RETURN 9FT ADLT (ELECTROSURGICAL) ×3
ELECTRODE REM PT RTRN 9FT ADLT (ELECTROSURGICAL) ×1 IMPLANT
GAUZE SPONGE 4X4 12PLY STRL (GAUZE/BANDAGES/DRESSINGS) IMPLANT
GLOVE BIO SURGEON STRL SZ7.5 (GLOVE) ×9 IMPLANT
GOWN STRL REUS W/ TWL LRG LVL3 (GOWN DISPOSABLE) ×2 IMPLANT
GOWN STRL REUS W/TWL LRG LVL3 (GOWN DISPOSABLE) ×4
HANDLE YANKAUER SUCT BULB TIP (MISCELLANEOUS) ×3 IMPLANT
JACKSON PRATT 10 (INSTRUMENTS) IMPLANT
LABEL OR SOLS (LABEL) IMPLANT
NEEDLE HYPO 22GX1.5 SAFETY (NEEDLE) ×3 IMPLANT
NS IRRIG 500ML POUR BTL (IV SOLUTION) ×3 IMPLANT
PACK BASIN MAJOR ARMC (MISCELLANEOUS) ×3 IMPLANT
RELOAD PROXIMATE 75MM BLUE (ENDOMECHANICALS) ×6 IMPLANT
RETAINER VISCERA MED (MISCELLANEOUS) IMPLANT
STAPLER PROXIMATE 75MM BLUE (STAPLE) ×3 IMPLANT
STAPLER SKIN PROX 35W (STAPLE) ×3 IMPLANT
SUT ETHIBOND 0 MO6 C/R (SUTURE) IMPLANT
SUT ETHILON 3-0 FS-10 30 BLK (SUTURE)
SUT PDS AB 1 TP1 96 (SUTURE) ×6 IMPLANT
SUT PROLENE 0 CT 1 30 (SUTURE) IMPLANT
SUT SILK 3-0 (SUTURE) ×6 IMPLANT
SUT VIC AB 2-0 CT2 27 (SUTURE) ×3 IMPLANT
SUT VIC AB 3-0 SH 27 (SUTURE)
SUT VIC AB 3-0 SH 27X BRD (SUTURE) IMPLANT
SUT VICRYL+ 3-0 144IN (SUTURE) IMPLANT
SUTURE EHLN 3-0 FS-10 30 BLK (SUTURE) IMPLANT
SYR 30ML LL (SYRINGE) ×3 IMPLANT

## 2016-01-13 NOTE — Anesthesia Preprocedure Evaluation (Addendum)
Anesthesia Evaluation  Patient identified by MRN, date of birth, ID band Patient awake    Reviewed: Allergy & Precautions, NPO status , Patient's Chart, lab work & pertinent test results  Airway Mallampati: II  TM Distance: >3 FB     Dental  (+) Upper Dentures   Pulmonary neg pulmonary ROS,    Pulmonary exam normal        Cardiovascular hypertension, Pt. on medications Normal cardiovascular exam     Neuro/Psych Peripheral neuropathy  Neuromuscular disease negative psych ROS   GI/Hepatic Neg liver ROS, Incarcerated ventral hernia   Endo/Other  diabetes, Type 2  Renal/GU negative Renal ROS  negative genitourinary   Musculoskeletal  (+) Arthritis , Osteoarthritis,    Abdominal Normal abdominal exam  (+)   Peds negative pediatric ROS (+)  Hematology negative hematology ROS (+)   Anesthesia Other Findings Past Medical History: No date: Benign neoplasm of colon No date: Carpal tunnel syndrome on both sides No date: Diabetes (HCC) No date: Diabetic polyneuropathy (HCC) No date: Diverticulitis No date: Hyperlipidemia No date: Hypertension No date: Osteoarthritis  Reproductive/Obstetrics                            Anesthesia Physical Anesthesia Plan  ASA: III and emergent  Anesthesia Plan: General   Post-op Pain Management:    Induction: Intravenous, Rapid sequence and Cricoid pressure planned  Airway Management Planned: Oral ETT  Additional Equipment:   Intra-op Plan:   Post-operative Plan: Extubation in OR  Informed Consent: I have reviewed the patients History and Physical, chart, labs and discussed the procedure including the risks, benefits and alternatives for the proposed anesthesia with the patient or authorized representative who has indicated his/her understanding and acceptance.   Dental advisory given  Plan Discussed with: CRNA and Surgeon  Anesthesia Plan  Comments:         Anesthesia Quick Evaluation

## 2016-01-13 NOTE — Consult Note (Signed)
Reason for Consult:  Chief Complaint  Patient presents with  . Hernia  . Emesis   Referring Physician: Olean Ree, MD  [name] is an 82 y.o. female.  HPI: [name] is a 82 year old female with a past medical history of hypertension, hyperlipidemia, diabetes mellitus is presenting to the emergency department with five-day history of abdominal pain associated with nausea and vomiting. Patient was unable to tolerate by mouth intake including her medications. Patient denies any fevers or chills. No similar complaints in the past. Patient was started with ciprofloxacin and Flagyl by her primary care physician last week for possible diverticulitis. Patient is a diagnosed with small bowel obstruction and admitted to surgical service. Hospitalist team is consulted as patient is in DKA with a glucose of 516  Past Medical History:  Diagnosis Date  . Benign neoplasm of colon   . Carpal tunnel syndrome on both sides   . Diabetes ([place])   . Diabetic polyneuropathy ([place])   . Diverticulitis   . Hyperlipidemia   . Hypertension   . Osteoarthritis     Past Surgical History:  Procedure Laterality Date  . CESAREAN SECTION    . pessary    . REPLACEMENT TOTAL KNEE BILATERAL    . VENTRAL HERNIA REPAIR      Family History  Problem Relation Age of Onset  . Breast cancer Mother 62  . Breast cancer Maternal Aunt     Social History:  reports that she has never smoked. She has never used smokeless tobacco. She reports that she does not drink alcohol. Her drug history is not on file.  Allergies: No Known Allergies  Medications: I have reviewed the patient's current medications.  Results for orders placed or performed during the hospital encounter of 01/13/16 (from the past 48 hour(s))  Comprehensive metabolic panel     Status: Abnormal   Collection Time: 01/13/16  2:26 PM  Result Value Ref Range   Sodium 131 (L) 135 - 145 mmol/L   Potassium 3.5 3.5 - 5.1 mmol/L   Chloride 83 (L) 101  - 111 mmol/L   CO2 30 22 - 32 mmol/L   Glucose, Bld 516 (HH) 65 - 99 mg/dL    Comment: CRITICAL RESULT CALLED TO, READ BACK BY AND VERIFIED WITH [name] ON 01/13/16 AT 1505 BY [facility]    BUN 38 (H) 6 - 20 mg/dL   Creatinine, Ser 1.87 (H) 0.44 - 1.00 mg/dL   Calcium 10.1 8.9 - 10.3 mg/dL   Total Protein 8.6 (H) 6.5 - 8.1 g/dL   Albumin 4.2 3.5 - 5.0 g/dL   AST 19 15 - 41 U/L   ALT 16 14 - 54 U/L   Alkaline Phosphatase 98 38 - 126 U/L   Total Bilirubin 0.7 0.3 - 1.2 mg/dL   GFR calc non Af Amer 24 (L) >60 mL/min   GFR calc Af Amer 28 (L) >60 mL/min    Comment: (NOTE) The eGFR has been calculated using the CKD EPI equation. This calculation has not been validated in all clinical situations. eGFR's persistently <60 mL/min signify possible Chronic Kidney Disease.    Anion gap 18 (H) 5 - 15  CBC     Status: Abnormal   Collection Time: 01/13/16  2:26 PM  Result Value Ref Range   WBC 12.1 (H) 3.6 - 11.0 K/uL   RBC 4.83 3.80 - 5.20 MIL/uL   Hemoglobin 14.9 12.0 - 16.0 g/dL   HCT 44.6 35.0 - 47.0 %  MCV 92.4 80.0 - 100.0 fL   MCH 30.8 26.0 - 34.0 pg   MCHC 33.4 32.0 - 36.0 g/dL   RDW 14.0 11.5 - 14.5 %   Platelets 373 150 - 440 K/uL  Beta-hydroxybutyric acid     Status: Abnormal   Collection Time: 01/13/16  2:26 PM  Result Value Ref Range   Beta-Hydroxybutyric Acid 1.47 (H) 0.05 - 0.27 mmol/L  Glucose, capillary     Status: Abnormal   Collection Time: 01/13/16  4:43 PM  Result Value Ref Range   Glucose-Capillary 401 (H) 65 - 99 mg/dL    Ct Abdomen Pelvis Wo Contrast  Result Date: 01/13/2016 CLINICAL DATA:  Vomiting. EXAM: CT ABDOMEN AND PELVIS WITHOUT CONTRAST TECHNIQUE: Multidetector CT imaging of the abdomen and pelvis was performed following the standard protocol without IV contrast. COMPARISON:  None. FINDINGS: Lower chest: No acute abnormality. Hepatobiliary: No focal liver abnormality is seen. No gallstones, gallbladder wall thickening, or biliary dilatation.  Pancreas: Unremarkable. No pancreatic ductal dilatation or surrounding inflammatory changes. Spleen: Normal in size without focal abnormality. Adrenals/Urinary Tract: Adrenal glands are unremarkable. Kidneys are normal, without renal calculi, focal lesion, or hydronephrosis. Bladder is unremarkable. Stomach/Bowel: The stomach is mildly distended. There is a small bowel obstruction. The transition point is associated with the ventral hernia just below the umbilicus. There is an incarcerated loop of bowel in this region. Colonic diverticulosis is seen without diverticulitis. Visualized appendix is unremarkable. Vascular/Lymphatic: Atherosclerosis in the abdominal aorta and branching vessels. No adenopathy. Reproductive: 2 cm simple cyst in the right ovary. The adnexae and uterus are otherwise normal. Other: There is a fat containing umbilical hernia. There is also a ventral hernia inferior to the emboli kiss which contains an incarcerated loop of small bowel. Musculoskeletal: Loss of height at L5 is likely osteoporotic from age-indeterminate compression fracture. However, I suspect this is not acute. IMPRESSION: 1. Incarcerated loop of small bowel in a ventral hernia just inferior to the umbilicus resulting in a high-grade small bowel obstruction. 2. Mild atherosclerosis in the abdominal aorta. Electronically Signed   By: Dorise Bullion III M.D   On: 01/13/2016 15:44    ROS:  CONSTITUTIONAL: Denies fevers, chills. Denies any fatigue, weakness.  EYES: Denies blurry vision, double vision, eye pain. EARS, NOSE, THROAT: Denies tinnitus, ear pain, hearing loss. RESPIRATORY: Denies cough, wheeze, shortness of breath.  CARDIOVASCULAR: Denies chest pain, palpitations, edema.  GASTROINTESTINAL: Patient is reporting nausea, vomiting, and generalized abdominal pain. Denies bright red blood per rectum. GENITOURINARY: Denies dysuria, hematuria. ENDOCRINE: Denies nocturia or thyroid problems. HEMATOLOGIC AND LYMPHATIC:  Denies easy bruising or bleeding. SKIN: Denies rash or lesion. MUSCULOSKELETAL: Denies pain in neck, back, shoulder, knees, hips or arthritic symptoms.  NEUROLOGIC: Denies paralysis, paresthesias.  PSYCHIATRIC: Denies anxiety or depressive symptoms. Blood pressure 130/85, pulse (!) 111, temperature 97.6 F (36.4 C), temperature source Oral, height 5' 4"  (1.626 m), weight 67.1 kg (148 lb), SpO2 96 %.   PHYSICAL EXAMINATION:  GENERAL: Well-nourished, well-developed , currently in no acute distress.  HEAD: Normocephalic, atraumatic.  EYES: Pupils equal, round, and reactive to light. Extraocular muscles intact. No scleral icterus.  MOUTH: Moist mucosal membranes. Dentition intact. No abscess noted. EARS, NOSE, THROAT: Clear without exudates. No external lesions.  NECK: Supple. No thyromegaly. No nodules. No JVD.  PULMONARY: Clear to auscultation bilaterally without wheezes, rales, or rhonchi. No use of accessory muscles. Good respiratory effort. CHEST: Nontender to palpation.  CARDIOVASCULAR: S1, S2, regular rate and rhythm. No murmurs, rubs, or  gallops.  GASTROINTESTINAL: Diffusely tender, no rebound tenderness nondistended. No masses MUSCULOSKELETAL: No swelling, clubbing, edema. Range of motion full in all extremities. NEUROLOGIC: Cranial nerves II-XII intact. No gross focal neurological deficits. Sensation intact. Reflexes intact. SKIN: No ulcerations, lesions, rash, cyanosis. Skin warm, dry. Turgor intact. PSYCHIATRIC: Mood, affect within normal limits. Patient awake, alert, oriented x 3. Insight and judgment intact.   Assessment/Plan:   HPI: Mrs. Icenhour is a 82 year old female with a past medical history of hypertension, hyperlipidemia, diabetes mellitus is presenting to the emergency department with five-day history of abdominal pain associated with nausea and vomiting. Patient was unable to tolerate by mouth intake including her medications.  #Diabetic ketoacidosis secondary to  nausea and vomiting from small bowel obstruction Nothing by mouth Aggressive hydration with IV fluids Insulin drip until anion gap is closed and bicarbonate is in the normal range Monitor BMP every 4 hours Check hemoglobin A1c in a.m. Consult is placed to diabetic coordinator Hold home medications for diabetes  glimipride  #Essential hypertension Blood pressure is stable at this time and patient is nothing by mouth hold home medications We will provide IV Lopressor as needed basis  #Acute kidney injury secondary to dehydration from nausea and vomiting Provide IV fluids, avoid nephrotoxins Monitor renal function closely   #Small bowel obstruction from incarcerated ventral hernia Patient is admitted to surgical service Nothing by mouth, IV fluids Pain management by surgery NG tube for decompression  #Hyperlipidemia currently patient is nothing by mouth Check fasting lipid panel and hold home medications  Patient is full code, daughter is the healthcare power of attorney  GI prophylaxis with Protonix DVT prophylaxis with SCDs    TOTAL CRITICAL CARE TIME TAKING CARE OF THIS PATIENT: 42  minutes.   Note: This dictation was prepared with Dragon dictation along with smaller phrase technology. Any transcriptional errors that result from this process are unintentional.   @[facility] @ Pager - [phone] 01/13/2016, 5:57 PM

## 2016-01-13 NOTE — ED Notes (Signed)
Pt taken to ct 

## 2016-01-13 NOTE — H&P (Signed)
Date of Admission:  01/13/2016  Reason for Admission:  Incarcerated ventral hernia  History of Present Illness: [name] is a 82 y.o. female who presents with a 5 day history of abdominal pain.  She reports that she started feeling a "knot" on her abdomen around 11/28 and started having more significant pain and discomfort on 11/30.  She started having nausea and multiple episodes of emesis at that point as well and has not been able to tolerate po intake nor her home medications, which include two diabetic medications.  She presented today to the emergency room for further evaluation.  She denies having any fevers, chills, chest pain, shortness of breath, other areas of abdominal pain, dysuria, hematuria, or blood in the stool.  She presented to her PCP this week who started her on cipro/flagyl due to suspicion for diverticulitis.  In the ED, she was noted to be in DKA and acute renal failure, with a glucose of 516 and creatinine of 1.87 (baseline 0.7-0.8), and anion gap of 18.  CT scan was obtained which showed an incarcerated periumbilical hernia with a small segment of small bowel incarcerated.  This is resulting in a high grade small bowel obstruction.   Past Medical History: Past Medical History:  Diagnosis Date  . Benign neoplasm of colon   . Carpal tunnel syndrome on both sides   . Diabetes (HCC)   . Diabetic polyneuropathy (HCC)   . Diverticulitis   . Hyperlipidemia   . Hypertension   . Osteoarthritis      Past Surgical History: Past Surgical History:  Procedure Laterality Date  . CESAREAN SECTION    . pessary    . REPLACEMENT TOTAL KNEE BILATERAL    . VENTRAL HERNIA REPAIR      Home Medications: Per outside records, patient also taking Januvia 100 mg daily.  Prior to Admission medications   Medication Sig Start Date End Date Taking? Authorizing Provider  aspirin EC 81 MG tablet Take 81 mg by mouth daily.   Yes Historical Provider, MD  ciprofloxacin (CIPRO) 500  MG tablet Take 500 mg by mouth 2 (two) times daily. 01/09/16  Yes Historical Provider, MD  glimepiride (AMARYL) 4 MG tablet Take 4 mg by mouth daily. 04/27/15  Yes Historical Provider, MD  hydrochlorothiazide (HYDRODIURIL) 25 MG tablet Take 25 mg by mouth daily. 04/26/15  Yes Historical Provider, MD  lisinopril (PRINIVIL,ZESTRIL) 20 MG tablet Take 20 mg by mouth daily.   Yes Historical Provider, MD  metroNIDAZOLE (FLAGYL) 500 MG tablet Take 500 mg by mouth 3 (three) times daily. 01/09/16  Yes Historical Provider, MD  simvastatin (ZOCOR) 40 MG tablet Take 40 mg by mouth daily. 10/16/15  Yes Historical Provider, MD  traMADol-acetaminophen (ULTRACET) 37.5-325 MG tablet Take 1 tablet by mouth every 8 (eight) hours as needed. 10/16/15  Yes Historical Provider, MD    Allergies: No Known Allergies  Social History:  reports that she has never smoked. She has never used smokeless tobacco. She reports that she does not drink alcohol. Her drug history is not on file.   Family History: Family History  Problem Relation Age of Onset  . Breast cancer Mother [id number]  . Breast cancer Maternal Aunt     Review of Systems: Review of Systems  Constitutional: Negative for chills and fever.  HENT: Negative for hearing loss.   Eyes: Negative for blurred vision.  Respiratory: Negative for cough and shortness of breath.   Cardiovascular: Negative for chest pain and leg swelling.  Gastrointestinal: Positive for abdominal pain, nausea and vomiting. Negative for blood in stool, constipation, diarrhea and heartburn.  Genitourinary: Negative for dysuria and hematuria.  Musculoskeletal: Negative for myalgias.  Skin: Negative for rash.  Neurological: Negative for dizziness.  Psychiatric/Behavioral: Negative for depression.  All other systems reviewed and are negative.   Physical Exam BP 130/85 (BP Location: Left Arm)   Pulse (!) 111   Temp 97.6 F (36.4 C) (Oral)   Ht 5\' 4"  (1.626 m)   Wt 67.1 kg (148 lb)   SpO2  96%   BMI 25.40 kg/m  CONSTITUTIONAL: No acute distress HEENT:  Normocephalic, atraumatic, extraocular motion intact. NECK: Trachea is midline, and there is no jugular venous distension. RESPIRATORY:  Lungs are clear, and breath sounds are equal bilaterally. Normal respiratory effort without pathologic use of accessory muscles. CARDIOVASCULAR: Heart is regular rhythm but with low grade tachycardia, without murmurs, gallops, or rubs. GI: The abdomen is soft, mildly distended, with tenderness to palpation over the patient's periumbilical hernia. Hernia unable to be reduced at bedside.  No superficial skin changes or ulceration.  No peritoneal signs.  Prior midline incision well healed.   MUSCULOSKELETAL:  Normal muscle strength and tone in all four extremities.  No peripheral edema or cyanosis. SKIN: Skin turgor is normal. There are no pathologic skin lesions.  NEUROLOGIC:  Motor and sensation is grossly normal.  Cranial nerves are grossly intact. PSYCH:  Alert and oriented to person, place and time. Affect is normal.  Laboratory Analysis: Results for orders placed or performed during the hospital encounter of 01/13/16 (from the past 24 hour(s))  Comprehensive metabolic panel     Status: Abnormal   Collection Time: 01/13/16  2:26 PM  Result Value Ref Range   Sodium 131 (L) 135 - 145 mmol/L   Potassium 3.5 3.5 - 5.1 mmol/L   Chloride 83 (L) 101 - 111 mmol/L   CO2 30 22 - 32 mmol/L   Glucose, Bld 516 (HH) 65 - 99 mg/dL   BUN 38 (H) 6 - 20 mg/dL   Creatinine, Ser 1.61 (H) 0.44 - 1.00 mg/dL   Calcium 09.6 8.9 - 04.5 mg/dL   Total Protein 8.6 (H) 6.5 - 8.1 g/dL   Albumin 4.2 3.5 - 5.0 g/dL   AST 19 15 - 41 U/L   ALT 16 14 - 54 U/L   Alkaline Phosphatase 98 38 - 126 U/L   Total Bilirubin 0.7 0.3 - 1.2 mg/dL   GFR calc non Af Amer 24 (L) >60 mL/min   GFR calc Af Amer 28 (L) >60 mL/min   Anion gap 18 (H) 5 - 15  CBC     Status: Abnormal   Collection Time: 01/13/16  2:26 PM  Result Value  Ref Range   WBC 12.1 (H) 3.6 - 11.0 K/uL   RBC 4.83 3.80 - 5.20 MIL/uL   Hemoglobin 14.9 12.0 - 16.0 g/dL   HCT 40.9 81.1 - 91.4 %   MCV 92.4 80.0 - 100.0 fL   MCH 30.8 26.0 - 34.0 pg   MCHC 33.4 32.0 - 36.0 g/dL   RDW 78.2 95.6 - 21.3 %   Platelets 373 150 - 440 K/uL  Beta-hydroxybutyric acid     Status: Abnormal   Collection Time: 01/13/16  2:26 PM  Result Value Ref Range   Beta-Hydroxybutyric Acid 1.47 (H) 0.05 - 0.27 mmol/L  Glucose, capillary     Status: Abnormal   Collection Time: 01/13/16  4:43 PM  Result Value Ref Range  Glucose-Capillary 401 (H) 65 - 99 mg/dL    Imaging: Ct Abdomen Pelvis Wo Contrast  Result Date: 01/13/2016 CLINICAL DATA:  Vomiting. EXAM: CT ABDOMEN AND PELVIS WITHOUT CONTRAST TECHNIQUE: Multidetector CT imaging of the abdomen and pelvis was performed following the standard protocol without IV contrast. COMPARISON:  None. FINDINGS: Lower chest: No acute abnormality. Hepatobiliary: No focal liver abnormality is seen. No gallstones, gallbladder wall thickening, or biliary dilatation. Pancreas: Unremarkable. No pancreatic ductal dilatation or surrounding inflammatory changes. Spleen: Normal in size without focal abnormality. Adrenals/Urinary Tract: Adrenal glands are unremarkable. Kidneys are normal, without renal calculi, focal lesion, or hydronephrosis. Bladder is unremarkable. Stomach/Bowel: The stomach is mildly distended. There is a small bowel obstruction. The transition point is associated with the ventral hernia just below the umbilicus. There is an incarcerated loop of bowel in this region. Colonic diverticulosis is seen without diverticulitis. Visualized appendix is unremarkable. Vascular/Lymphatic: Atherosclerosis in the abdominal aorta and branching vessels. No adenopathy. Reproductive: 2 cm simple cyst in the right ovary. The adnexae and uterus are otherwise normal. Other: There is a fat containing umbilical hernia. There is also a ventral hernia inferior  to the emboli kiss which contains an incarcerated loop of small bowel. Musculoskeletal: Loss of height at L5 is likely osteoporotic from age-indeterminate compression fracture. However, I suspect this is not acute. IMPRESSION: 1. Incarcerated loop of small bowel in a ventral hernia just inferior to the umbilicus resulting in a high-grade small bowel obstruction. 2. Mild atherosclerosis in the abdominal aorta. Electronically Signed   By: Gerome Samavid  Williams III M.D   On: 01/13/2016 15:44    Assessment and Plan: This is a 82 y.o. female who presents with an incarcerated ventral hernia and diabetic ketoacidosis secondary to small bowel obstruction.  Patient will be admitted to the ICU for insulin drip and aggressive hydration for her diabetic ketoacidosis.  She will have IV fluid hydration and remain NPO.  She will have NG tube placed in ED.  Foley catheter ordered for close monitoring of In/Out.  She requires urgent ventral hernia repair but as she is currently not peritoneal, she does not require emergent surgery and I would like her ketoacidosis to be better controlled and corrected prior to surgery tonight.  Hospitalist has been consulted for assistance in her diabetic management.  The risks and benefits of the surgery have been explained to the patient and her daughter.  Risks of bleeding, infection, and injury to surrounding structures, particularly the bowel that is herniating possibly requiring bowel resection, have been explained to the patient.  She understands this plan and all of her questions have been answered.   Howie IllJose Luis Keith Felten, MD [facility]

## 2016-01-13 NOTE — ED Notes (Signed)
Report given to Myra RN for CCU

## 2016-01-13 NOTE — ED Notes (Signed)
Pt arrives to the ER from home, pt was seen at urgent care on Thursday with abd pain, pt states that she has a hernia, pt was told that she would see the surgeon on dec 15th, pt states that she has been unable to keep anything down since Thursday, pt vomiting large amount of watery bilious emesis, at 700 ml's. Large swollen area to the umbilicus area. Pt states that it is tender to touch

## 2016-01-13 NOTE — Progress Notes (Signed)
82 yr old female with hyperglycemia and incarcerated ventral hernia.  She has had insulin gtt and glucose is now 149 and last potassium 2.9.  I have discussed the procedure with the patient and her family and they are in agreement with the plan for incarcerated ventral hernia repair. Also discussed the risk of surgery including recurrence which can be up to 30% in the case of complex hernias, use of prosthetic materials (mesh) and the increased risk of infxn, post-op infxn and the possible need for re-operation and removal of mesh if used, possibility of post-op SBO or ileus, and the risks of general anesthetic including MI, CVA, sudden death or even reaction to anesthetic medications. The patient and family understands the risks, any and all questions were answered to the patient's satisfaction.  I have discussed with Dr. Noralyn Pickarroll of anesthesia as well and when the potassium is up to 3.0 she is safe to proceed to the OR.  She is finishing up the 30mEq IV K+ now and is having NG tube placed with 40mEq of K+ now as well.  I have ordered a stat potassium to be drawn and as soon as this is back and an acceptable level we will proceed to the OR.

## 2016-01-13 NOTE — ED Provider Notes (Signed)
[facility] Emergency Department Provider Note  ____________________________________________  Time seen: Approximately 3:16 PM  I have reviewed the triage vital signs and the nursing notes.   HISTORY  Chief Complaint Hernia and Emesis    HPI [name] is a 82 y.o. female who complains of a hernia next to her bellybutton that appeared suddenly about 4 days ago. She was seen in urgent care 3 days ago and discharged home to follow up with surgery on an outpatient basis. she has not seen surgery yet.Since then she has not been able to tolerate anything by mouth and has been vomiting persistently. She has not been able to take her medications including for hypertension and diabetes.     Past Medical History:  Diagnosis Date  . Hypertension      There are no active problems to display for this patient.    History reviewed. No pertinent surgical history.   Prior to Admission medications   Medication Sig Start Date End Date Taking? Authorizing Provider  aspirin EC 81 MG tablet Take 81 mg by mouth daily.   Yes Historical Provider, MD  ciprofloxacin (CIPRO) 500 MG tablet Take 500 mg by mouth 2 (two) times daily. 01/09/16  Yes Historical Provider, MD  glimepiride (AMARYL) 4 MG tablet Take 4 mg by mouth daily. 04/27/15  Yes Historical Provider, MD  hydrochlorothiazide (HYDRODIURIL) 25 MG tablet Take 25 mg by mouth daily. 04/26/15  Yes Historical Provider, MD  lisinopril (PRINIVIL,ZESTRIL) 20 MG tablet Take 20 mg by mouth daily.   Yes Historical Provider, MD  metroNIDAZOLE (FLAGYL) 500 MG tablet Take 500 mg by mouth 3 (three) times daily. 01/09/16  Yes Historical Provider, MD  simvastatin (ZOCOR) 40 MG tablet Take 40 mg by mouth daily. 10/16/15  Yes Historical Provider, MD  traMADol-acetaminophen (ULTRACET) 37.5-325 MG tablet Take 1 tablet by mouth every 8 (eight) hours as needed. 10/16/15  Yes Historical Provider, MD   Aspirin Metformin Lisinopril HCTZ   Allergies Patient has no known allergies.   Family History  Problem Relation Age of Onset  . Breast cancer Mother 2191  . Breast cancer Maternal Aunt     Social History Social History  Substance Use Topics  . Smoking status: Not on file  . Smokeless tobacco: Not on file  . Alcohol use Not on file  No tobacco or alcohol use  Review of Systems  Constitutional:   No fever or chills.  ENT:   No sore throat. No rhinorrhea. Cardiovascular:   No chest pain. Respiratory:   No dyspnea or cough. Gastrointestinal:   Positive abdominal pain with vomiting. Last BM 3 days ago.  Genitourinary:   Negative for dysuria or difficulty urinating. Musculoskeletal:   Negative for focal pain or swelling Neurological:   Negative for headaches 10-point ROS otherwise negative.  ____________________________________________   PHYSICAL EXAM:  VITAL SIGNS: ED Triage Vitals [01/13/16 1411]  Enc Vitals Group     BP 130/85     Pulse Rate (!) 111     Resp      Temp 97.6 F (36.4 C)     Temp Source Oral     SpO2 96 %     Weight 148 lb (67.1 kg)     Height 5\' 4"  (1.626 m)     Head Circumference      Peak Flow      Pain Score      Pain Loc      Pain Edu?  Excl. in GC?     Vital signs reviewed, nursing assessments reviewed.   Constitutional:   Alert and oriented. Ill-appearing, uncomfortable Eyes:   No scleral icterus. No conjunctival pallor. PERRL. EOMI.  No nystagmus. ENT   Head:   Normocephalic and atraumatic.   Nose:   No congestion/rhinnorhea. No septal hematoma   Mouth/Throat:   Dry mucous membranes, no pharyngeal erythema. No peritonsillar mass.    Neck:   No stridor. No SubQ emphysema. No meningismus. Hematological/Lymphatic/Immunilogical:   No cervical lymphadenopathy. Cardiovascular:   Tachycardia heart rate 110. Symmetric bilateral radial and DP pulses.  No murmurs.  Respiratory:   Normal respiratory effort without  tachypnea nor retractions. Breath sounds are clear and equal bilaterally. No wheezes/rales/rhonchi. Gastrointestinal:   Soft and nontender except for large hernia mass situated lateral to the umbilicus on the right side. It is firm, tender to touch, and nonreducible manually by me.. Non distended. There is no CVA tenderness.  No rebound, rigidity, or guarding. Genitourinary:   deferred Musculoskeletal:   Nontender with normal range of motion in all extremities. No joint effusions.  No lower extremity tenderness.  No edema. Neurologic:   Normal speech and language.  CN 2-10 normal. Motor grossly intact. No gross focal neurologic deficits are appreciated.  Skin:    Skin is warm, dry and intact. No rash noted.  No petechiae, purpura, or bullae.  ____________________________________________    LABS (pertinent positives/negatives) (all labs ordered are listed, but only abnormal results are displayed) Labs Reviewed  COMPREHENSIVE METABOLIC PANEL - Abnormal; Notable for the following:       Result Value   Sodium 131 (*)    Chloride 83 (*)    Glucose, Bld 516 (*)    BUN 38 (*)    Creatinine, Ser 1.87 (*)    Total Protein 8.6 (*)    GFR calc non Af Amer 24 (*)    GFR calc Af Amer 28 (*)    Anion gap 18 (*)    All other components within normal limits  CBC - Abnormal; Notable for the following:    WBC 12.1 (*)    All other components within normal limits  BETA-HYDROXYBUTYRIC ACID - Abnormal; Notable for the following:    Beta-Hydroxybutyric Acid 1.47 (*)    All other components within normal limits  URINALYSIS COMPLETEWITH MICROSCOPIC ([facility] ONLY)   ____________________________________________   EKG  Interpreted by me Sinus rhythm rate of 91, normal axis and intervals. Poor R-wave progression in anterior precordial leads. Diffuse T-wave inversions in inferior and lateral leads. T-wave inversions are new compared to 09/20/2013, possibly baseline versus secondary to acute illness with  acidosis. Doubt ACS  ____________________________________________    RADIOLOGY  CT reveals strangulated loop of small bowel in the ventral hernia, small bowel obstruction.  ____________________________________________   PROCEDURES Procedures CRITICAL CARE Performed by: Scotty CourtSTAFFORD, Reynol Arnone   Total critical care time: 35 minutes  Critical care time was exclusive of separately billable procedures and treating other patients.  Critical care was necessary to treat or prevent imminent or life-threatening deterioration.  Critical care was time spent personally by me on the following activities: development of treatment plan with patient and/or surrogate as well as nursing, discussions with consultants, evaluation of patient's response to treatment, examination of patient, obtaining history from patient or surrogate, ordering and performing treatments and interventions, ordering and review of laboratory studies, ordering and review of radiographic studies, pulse oximetry and re-evaluation of patient's condition.  ____________________________________________   INITIAL IMPRESSION /  ASSESSMENT AND PLAN / ED COURSE  Pertinent labs & imaging results that were available during my care of the patient were reviewed by me and considered in my medical decision making (see chart for details).  Patient presents with abdominal pain, vomiting, firm hernia mass which is nonreducible on my exam. Noncontrast CT scan, IV fluids, Zofran and morphine. Beta hydroxybutyrate added to labs due to evidence on chemistry panel of acidosis, possibly DKA.    ----------------------------------------- 4:16 PM on 01/13/2016 ----------------------------------------- CT reveals incarcerated loop of bowel in the hernia. Beta hydroxy butyrate significantly elevated indicative of DKA. Continue vigorous hydration, 2 L IV saline. Insulin drip. Discussed with surgery for further evaluation.    Clinical Course as of Jan 13 1832  Wynelle Link Jan 13, 2016  1706  D/w surgery. Agrees hernia is irreducible.  Goal to correct CBG < 300, improve acidosis and then proceed to OR. Case d/w Dr. Amado Coe for internal medicine consult to assist with DM mgmt.  [PS]    Clinical Course User Index [PS] Sharman Cheek, MD      ____________________________________________   FINAL CLINICAL IMPRESSION(S) / ED DIAGNOSES  Final diagnoses:  Incarcerated ventral hernia  Diabetic ketoacidosis without coma associated with type 2 diabetes mellitus (HCC)  Non-intractable vomiting, presence of nausea not specified, unspecified vomiting type  Dehydration       Portions of this note were generated with dragon dictation software. Dictation errors may occur despite best attempts at proofreading.    Sharman Cheek, MD 01/13/16 [phone]

## 2016-01-13 NOTE — Progress Notes (Signed)
eLink Physician-Brief Progress Note Patient Name: [name] DOB: 1933-06-19 MRN: [id number]   Date of Service  01/13/2016  HPI/Events of Note  82 yr old diabetic female who presented with nausea, vomiting, and abd pain admitted to ICU with SBO.  Patient also found to have hyperglycemia with mildly elevated AG.  No ABG.  Patient being treated for DKA.  Surgery has evaluated the patient.  Presently stable from hemodynamic and resp standpoint.    eICU Interventions  Chart reviewed.       Intervention Category Evaluation Type: New Patient Evaluation  Henry RusselSMITH, Dalayna Lauter, P 01/13/2016, 7:16 PM

## 2016-01-13 NOTE — ED Triage Notes (Signed)
Pt presents to ED with family c/o hernia pain in which she was seen at urgent care Thursday and was discharged. Pt was to see Dr. Hyacinth MeekerMiller on dec 15. Pt states the pain has worsened, "it hurts so bad I can't wait until the dec 15". Pt unable to keep food and liquids down.

## 2016-01-14 ENCOUNTER — Encounter: Payer: Self-pay | Admitting: Surgery

## 2016-01-14 ENCOUNTER — Inpatient Hospital Stay: Payer: Medicare Other | Admitting: Anesthesiology

## 2016-01-14 DIAGNOSIS — E860 Dehydration: Secondary | ICD-10-CM

## 2016-01-14 DIAGNOSIS — E1310 Other specified diabetes mellitus with ketoacidosis without coma: Secondary | ICD-10-CM

## 2016-01-14 DIAGNOSIS — K460 Unspecified abdominal hernia with obstruction, without gangrene: Secondary | ICD-10-CM

## 2016-01-14 LAB — BASIC METABOLIC PANEL
ANION GAP: 13 (ref 5–15)
ANION GAP: 6 (ref 5–15)
BUN: 28 mg/dL — AB (ref 6–20)
BUN: 38 mg/dL — AB (ref 6–20)
CALCIUM: 7.6 mg/dL — AB (ref 8.9–10.3)
CALCIUM: 8.6 mg/dL — AB (ref 8.9–10.3)
CO2: 26 mmol/L (ref 22–32)
CO2: 27 mmol/L (ref 22–32)
CREATININE: 1.02 mg/dL — AB (ref 0.44–1.00)
CREATININE: 1.52 mg/dL — AB (ref 0.44–1.00)
Chloride: 105 mmol/L (ref 101–111)
Chloride: 95 mmol/L — ABNORMAL LOW (ref 101–111)
GFR calc Af Amer: 36 mL/min — ABNORMAL LOW (ref >60)
GFR calc Af Amer: 59 mL/min — ABNORMAL LOW (ref >60)
GFR, EST NON AFRICAN AMERICAN: 31 mL/min — AB (ref >60)
GFR, EST NON AFRICAN AMERICAN: 51 mL/min — AB (ref >60)
GLUCOSE: 195 mg/dL — AB (ref 65–99)
GLUCOSE: 209 mg/dL — AB (ref 65–99)
Potassium: 3.3 mmol/L — ABNORMAL LOW (ref 3.5–5.1)
Potassium: 3.9 mmol/L (ref 3.5–5.1)
Sodium: 135 mmol/L (ref 135–145)
Sodium: 137 mmol/L (ref 135–145)

## 2016-01-14 LAB — CBC
HCT: 36.2 % (ref 35.0–47.0)
Hemoglobin: 12 g/dL (ref 12.0–16.0)
MCH: 30.5 pg (ref 26.0–34.0)
MCHC: 33 g/dL (ref 32.0–36.0)
MCV: 92.4 fL (ref 80.0–100.0)
PLATELETS: 273 10*3/uL (ref 150–440)
RBC: 3.92 MIL/uL (ref 3.80–5.20)
RDW: 13.7 % (ref 11.5–14.5)
WBC: 9.3 10*3/uL (ref 3.6–11.0)

## 2016-01-14 LAB — MAGNESIUM
Magnesium: 1.3 mg/dL — ABNORMAL LOW (ref 1.7–2.4)
Magnesium: 1.7 mg/dL (ref 1.7–2.4)

## 2016-01-14 LAB — GLUCOSE, CAPILLARY
GLUCOSE-CAPILLARY: 171 mg/dL — AB (ref 65–99)
GLUCOSE-CAPILLARY: 181 mg/dL — AB (ref 65–99)
GLUCOSE-CAPILLARY: 185 mg/dL — AB (ref 65–99)
GLUCOSE-CAPILLARY: 203 mg/dL — AB (ref 65–99)
GLUCOSE-CAPILLARY: 237 mg/dL — AB (ref 65–99)
Glucose-Capillary: 157 mg/dL — ABNORMAL HIGH (ref 65–99)
Glucose-Capillary: 160 mg/dL — ABNORMAL HIGH (ref 65–99)

## 2016-01-14 LAB — POTASSIUM: POTASSIUM: 3.8 mmol/L (ref 3.5–5.1)

## 2016-01-14 MED ORDER — BUPIVACAINE HCL (PF) 0.5 % IJ SOLN
INTRAMUSCULAR | Status: DC | PRN
  Administered 2016-01-14: 50 mL

## 2016-01-14 MED ORDER — SODIUM CHLORIDE 0.9 % IV SOLN
1.0000 g | INTRAVENOUS | Status: DC
  Administered 2016-01-15 – 2016-01-20 (×6): 1 g via INTRAVENOUS
  Filled 2016-01-14 (×8): qty 1

## 2016-01-14 MED ORDER — SUCCINYLCHOLINE CHLORIDE 20 MG/ML IJ SOLN
INTRAMUSCULAR | Status: DC | PRN
  Administered 2016-01-14: 60 mg via INTRAVENOUS

## 2016-01-14 MED ORDER — MAGNESIUM SULFATE IN D5W 1-5 GM/100ML-% IV SOLN
1.0000 g | Freq: Once | INTRAVENOUS | Status: AC
  Administered 2016-01-14: 1 g via INTRAVENOUS
  Filled 2016-01-14: qty 100

## 2016-01-14 MED ORDER — ONDANSETRON HCL 4 MG/2ML IJ SOLN: 4.0000 mg | Freq: Once | INTRAMUSCULAR | Status: DC | PRN

## 2016-01-14 MED ORDER — LACTATED RINGERS IV SOLN
INTRAVENOUS | Status: DC | PRN
  Administered 2016-01-14 (×4): via INTRAVENOUS

## 2016-01-14 MED ORDER — BUPIVACAINE LIPOSOME 1.3 % IJ SUSP
INTRAMUSCULAR | Status: AC
Start: 2016-01-14 — End: 2016-01-14
  Filled 2016-01-14: qty 20

## 2016-01-14 MED ORDER — ACETAMINOPHEN 10 MG/ML IV SOLN
1000.0000 mg | Freq: Four times a day (QID) | INTRAVENOUS | Status: AC
  Administered 2016-01-14 (×4): 1000 mg via INTRAVENOUS
  Filled 2016-01-14 (×6): qty 100

## 2016-01-14 MED ORDER — SUGAMMADEX SODIUM 200 MG/2ML IV SOLN
INTRAVENOUS | Status: DC | PRN
  Administered 2016-01-14: 200 mg via INTRAVENOUS

## 2016-01-14 MED ORDER — LABETALOL HCL 5 MG/ML IV SOLN
10.0000 mg | Freq: Once | INTRAVENOUS | Status: AC
  Administered 2016-01-14: 10 mg via INTRAVENOUS

## 2016-01-14 MED ORDER — FENTANYL CITRATE (PF) 100 MCG/2ML IJ SOLN
INTRAMUSCULAR | Status: DC | PRN
  Administered 2016-01-14 (×2): 25 ug via INTRAVENOUS
  Administered 2016-01-14: 50 ug via INTRAVENOUS

## 2016-01-14 MED ORDER — BUPIVACAINE HCL (PF) 0.5 % IJ SOLN
INTRAMUSCULAR | Status: AC
  Filled 2016-01-14: qty 30

## 2016-01-14 MED ORDER — LIDOCAINE HCL (CARDIAC) 20 MG/ML IV SOLN
INTRAVENOUS | Status: DC | PRN
  Administered 2016-01-14: 50 mg via INTRAVENOUS

## 2016-01-14 MED ORDER — MAGNESIUM SULFATE 50 % IJ SOLN: 1.0000 g | Freq: Once | INTRAMUSCULAR | Status: DC

## 2016-01-14 MED ORDER — ROCURONIUM BROMIDE 100 MG/10ML IV SOLN
INTRAVENOUS | Status: DC | PRN
  Administered 2016-01-14: 10 mg via INTRAVENOUS
  Administered 2016-01-14: 30 mg via INTRAVENOUS
  Administered 2016-01-14: 10 mg via INTRAVENOUS

## 2016-01-14 MED ORDER — SODIUM CHLORIDE 0.9 % IV BOLUS (SEPSIS)
1000.0000 mL | Freq: Once | INTRAVENOUS | Status: AC
  Administered 2016-01-14: 1000 mL via INTRAVENOUS

## 2016-01-14 MED ORDER — INSULIN GLARGINE 100 UNIT/ML ~~LOC~~ SOLN
5.0000 [IU] | Freq: Every day | SUBCUTANEOUS | Status: DC
  Administered 2016-01-14 – 2016-01-16 (×2): 5 [IU] via SUBCUTANEOUS
  Filled 2016-01-14 (×4): qty 0.05

## 2016-01-14 MED ORDER — LABETALOL HCL 5 MG/ML IV SOLN
INTRAVENOUS | Status: AC
  Administered 2016-01-14: 10 mg via INTRAVENOUS
  Filled 2016-01-14: qty 4

## 2016-01-14 MED ORDER — FENTANYL CITRATE (PF) 100 MCG/2ML IJ SOLN: INTRAMUSCULAR | Status: DC | PRN

## 2016-01-14 MED ORDER — SODIUM CHLORIDE 0.9 % IV SOLN
1.0000 g | Freq: Once | INTRAVENOUS | Status: AC
  Administered 2016-01-14: 1 g via INTRAVENOUS
  Filled 2016-01-14: qty 1

## 2016-01-14 MED ORDER — POTASSIUM CHLORIDE IN NACL 20-0.45 MEQ/L-% IV SOLN
INTRAVENOUS | Status: AC
  Administered 2016-01-14 – 2016-01-15 (×3): via INTRAVENOUS
  Administered 2016-01-15: 1000 mL via INTRAVENOUS
  Administered 2016-01-16: 10:00:00 via INTRAVENOUS
  Filled 2016-01-14 (×7): qty 1000

## 2016-01-14 MED ORDER — FENTANYL CITRATE (PF) 100 MCG/2ML IJ SOLN
25.0000 ug | INTRAMUSCULAR | Status: DC | PRN
  Administered 2016-01-14 (×3): 25 ug via INTRAVENOUS

## 2016-01-14 MED ORDER — FENTANYL CITRATE (PF) 100 MCG/2ML IJ SOLN
INTRAMUSCULAR | Status: AC
  Administered 2016-01-14: 25 ug via INTRAVENOUS
  Filled 2016-01-14: qty 2

## 2016-01-14 MED ORDER — HYDROMORPHONE HCL 1 MG/ML IJ SOLN
0.5000 mg | INTRAMUSCULAR | Status: DC | PRN
  Administered 2016-01-14 – 2016-01-19 (×12): 0.5 mg via INTRAVENOUS
  Filled 2016-01-14 (×2): qty 0.5
  Filled 2016-01-14: qty 1
  Filled 2016-01-14 (×6): qty 0.5
  Filled 2016-01-14: qty 1
  Filled 2016-01-14 (×3): qty 0.5

## 2016-01-14 MED ORDER — SODIUM CHLORIDE 0.9 % IV SOLN: 500.0000 mg | INTRAVENOUS | Status: DC

## 2016-01-14 MED ORDER — PROPOFOL 10 MG/ML IV BOLUS
INTRAVENOUS | Status: DC | PRN
  Administered 2016-01-14: 100 mg via INTRAVENOUS

## 2016-01-14 MED ORDER — SODIUM CHLORIDE 0.9 % IV SOLN: 1.0000 g | INTRAVENOUS | Status: DC

## 2016-01-14 MED ORDER — CEFAZOLIN SODIUM-DEXTROSE 2-3 GM-% IV SOLR
INTRAVENOUS | Status: DC | PRN
  Administered 2016-01-14: 2 g via INTRAVENOUS

## 2016-01-14 NOTE — Progress Notes (Signed)
NG and foley placed on unit, prior to surgery. Pt off insulin drip, potassium corrected. Report given to OR nurse. Pt transported to OR by OR nurse.

## 2016-01-14 NOTE — Progress Notes (Signed)
Pharmacy Note  Pt's CrCl 38.6 ml/min now >30 ml/min. Will adjust ertapenem 0.5 g q24h to 1 g IV q24h.

## 2016-01-14 NOTE — Anesthesia Procedure Notes (Signed)
Procedure Name: Intubation Performed by: Chynah Orihuela Pre-anesthesia Checklist: Patient identified, Emergency Drugs available, Suction available, Timeout performed and Patient being monitored Patient Re-evaluated:Patient Re-evaluated prior to inductionOxygen Delivery Method: Circle system utilized Preoxygenation: Pre-oxygenation with 100% oxygen Intubation Type: IV induction and Rapid sequence Laryngoscope Size: Mac and 3 Grade View: Grade II Tube type: Oral Tube size: 7.0 mm Number of attempts: 1 Airway Equipment and Method: Stylet Placement Confirmation: ETT inserted through vocal cords under direct vision,  positive ETCO2,  CO2 detector and breath sounds checked- equal and bilateral Secured at: 21 cm Tube secured with: Tape

## 2016-01-14 NOTE — Op Note (Signed)
Ventral Hernia repair, lysis of Adhesions with Small Bowel Resection with Anastomosis Procedure Note  Indications: The patient presents to the hospital with radiographic findings of a complete bowel obstruction due to ventral hernia  Pre-operative Diagnosis: Strangulated ventral hernia causing Small bowel obstruction  Post-operative Diagnosis: same  Surgeon: Gladis Riffle   Assistants: none  Anesthesia: General endotracheal anesthesia  ASA Class: 2  Procedure Details  The patient was seen in the Holding Room. The risks, benefits, complications, treatment options, and expected outcomes were discussed with the patient. The possibilities of reaction to medication, pulmonary aspiration, perforation of viscus, bleeding, recurrent infection, the need for additional procedures, failure to diagnose a condition, and creating a complication requiring transfusion or operation were discussed with the patient. The patient concurred with the proposed plan, giving informed consent.  The site of surgery properly noted/marked. The patient was taken to the operating room, identified as [name] and the procedure verified as Ventral hernia repairwith possible Small Bowel Resection. A Time Out was held and the above information confirmed.  The patient was placed supine after induction of a general anesthetic, the abdomen was prepped and draped in the standard fashion. A lower vertical midline incision was created.  Bovie electrocautery was then used to dissect down to the anterior fascia. Anterior fascia was then elevated up using Kocher clamps and scalpel was then used to incise the anterior abdominal fascia. Being careful to ensure no injury to the bowel incision was opened inferiorly and then more superiorly just around the umbilicus where the hernia was present. Very careful dissection with a combination of blunt and sharp dissection with Metzenbaum scissors was used to open the fascia.   A loop of  small bowel was in the incarcerated hernia sac as well as a large amount of omentum. The omentum was resected and removed from the hernia sac. The incarcerated segment was delivered and inspected. The infarcted small bowel of approximately 3 cm in size was resected by clamping and ligating the mesentery and dividing the bowel proximally and distally with a linear stapling device. The anastomosis was performed with a linear stapling device. The mucosa was inspected and found to be widely patent without any bleeding. Allis clamps were then used to also at the staple lines and another linear stapler load was used to close the enterotomy.  3-0 silk sutures were then used to imbricate the staple lines.  The entirety of the small bowel was then run revealing a very distended proximal portion and a very flat distal portion of the small bowel. Bowel contents could move easily through the anastomosis. The NG tube was checked and confirmed that it was then the correct position in the stomach. The abdominal cavity was irrigated. Hemostasis was confirmed. The bowel was returned to its anatomic location. The omentum was placed over the abdominal contents.  The midline incision, including the hernia was closed with a running #1 Loop PDS suture, one from superior and one from inferior using a short stitch technique. The soft tissue was irrigated. Exparel and marcaine mixture was then injected in the subcutaneous tissues evenly across the entire incision. The skin incision was closed with staples.  A honeycomb dressing was placed.  Instrument, sponge, and needle counts were correct prior to abdominal closure and at the conclusion of the case.   Findings: infarcted bowel  Estimated Blood Loss:  50cc         Drains: none         Total  IV Fluids:         Specimens: small bowel          Implants: none         Complications:  None; patient tolerated the procedure well.         Disposition: PACU -  hemodynamically stable.         Condition: stable

## 2016-01-14 NOTE — Progress Notes (Signed)
[employer] - [place] at [facility]                                                                                                                                                                                  Patient Demographics   [name], is a 82 y.o. female, DOB - 26-Mar-1933, ZOX:[id number]  Admit date - 01/13/2016   Admitting Physician Henrene Dodge, MD  Outpatient Primary MD for the patient is Singh,Jasmine, MD   LOS - 1  Subjective: Patient feeling better had surgery last night has abdominal pain    Review of Systems:   CONSTITUTIONAL: No documented fever. No fatigue, weakness. No weight gain, no weight loss.  EYES: No blurry or double vision.  ENT: No tinnitus. No postnasal drip. No redness of the oropharynx.  RESPIRATORY: No cough, no wheeze, no hemoptysis. No dyspnea.  CARDIOVASCULAR: No chest pain. No orthopnea. No palpitations. No syncope.  GASTROINTESTINAL: No nausea, no vomiting or diarrhea. Positive abdominal pain. No melena or hematochezia.  GENITOURINARY: No dysuria or hematuria.  ENDOCRINE: No polyuria or nocturia. No heat or cold intolerance.  HEMATOLOGY: No anemia. No bruising. No bleeding.  INTEGUMENTARY: No rashes. No lesions.  MUSCULOSKELETAL: No arthritis. No swelling. No gout.  NEUROLOGIC: No numbness, tingling, or ataxia. No seizure-type activity.  PSYCHIATRIC: No anxiety. No insomnia. No ADD.    Vitals:   Vitals:   01/14/16 0900 01/14/16 1000 01/14/16 1100 01/14/16 1140  BP: 120/68 (!) 141/74 130/71   Pulse: 95 98 99 100  Resp: 19 19 (!) 21 (!) 23  Temp:    98.8 F (37.1 C)  TempSrc:    Oral  SpO2: 95% 96% 95% 95%  Weight:      Height:        Wt Readings from Last 3 Encounters:  01/13/16 140 lb 6.9 oz (63.7 kg)     Intake/Output Summary (Last 24 hours) at 01/14/16 1340 Last data filed at 01/14/16 1100  Gross per 24 hour  Intake          3689.48 ml  Output              825 ml  Net          2864.48 ml     Physical Exam:   GENERAL: Pleasant-appearing in no apparent distress.  HEAD, EYES, EARS, NOSE AND THROAT: Atraumatic, normocephalic. Extraocular muscles are intact. Pupils equal and reactive to light. Sclerae anicteric. No conjunctival injection. No oro-pharyngeal erythema.  NECK: Supple. There is no jugular venous distention. No bruits, no lymphadenopathy, no thyromegaly.  HEART: Regular rate and rhythm,. No murmurs, no rubs, no clicks.  LUNGS: Clear to auscultation bilaterally.  No rales or rhonchi. No wheezes.  ABDOMEN: Postop nondistended EXTREMITIES: No evidence of any cyanosis, clubbing, or peripheral edema.  +2 pedal and radial pulses bilaterally.  NEUROLOGIC: The patient is alert, awake, and oriented x3 with no focal motor or sensory deficits appreciated bilaterally.  SKIN: Moist and warm with no rashes appreciated.  Psych: Not anxious, depressed LN: No inguinal LN enlargement    Antibiotics   Anti-infectives    Start     Dose/Rate Route Frequency Ordered Stop   01/15/16 1000  ertapenem (INVANZ) 0.5 g in sodium chloride 0.9 % 50 mL IVPB     500 mg 100 mL/hr over 30 Minutes Intravenous Every 24 hours 01/14/16 0542     01/15/16 0000  ertapenem (INVANZ) 1 g in sodium chloride 0.9 % 50 mL IVPB  Status:  Discontinued     1 g 100 mL/hr over 30 Minutes Intravenous Every 24 hours 01/14/16 0454 01/14/16 0541   01/14/16 0215  ertapenem (INVANZ) 1 g in sodium chloride 0.9 % 50 mL IVPB     1 g 100 mL/hr over 30 Minutes Intravenous  Once 01/14/16 0214 01/14/16 0330      Medications   Scheduled Meds: . acetaminophen  1,000 mg Intravenous Q6H  . [START ON 01/15/2016] ertapenem  500 mg Intravenous Q24H  . insulin aspart  0-9 Units Subcutaneous Q4H  . insulin glargine  5 Units Subcutaneous QHS  . pantoprazole (PROTONIX) IV  40 mg Intravenous Q24H   Continuous Infusions: . 0.45 % NaCl with KCl 20 mEq / L 100 mL/hr at 01/14/16 1100   PRN Meds:.hydrALAZINE, HYDROmorphone (DILAUDID)  injection, metoprolol, ondansetron **OR** ondansetron (ZOFRAN) IV   Data Review:   Micro Results Recent Results (from the past 240 hour(s))  MRSA PCR Screening     Status: None   Collection Time: 01/13/16  6:39 PM  Result Value Ref Range Status   MRSA by PCR NEGATIVE NEGATIVE Final    Comment:        The GeneXpert MRSA Assay (FDA approved for NASAL specimens only), is one component of a comprehensive MRSA colonization surveillance program. It is not intended to diagnose MRSA infection nor to guide or monitor treatment for MRSA infections.     Radiology Reports Ct Abdomen Pelvis Wo Contrast  Result Date: 01/13/2016 CLINICAL DATA:  Vomiting. EXAM: CT ABDOMEN AND PELVIS WITHOUT CONTRAST TECHNIQUE: Multidetector CT imaging of the abdomen and pelvis was performed following the standard protocol without IV contrast. COMPARISON:  None. FINDINGS: Lower chest: No acute abnormality. Hepatobiliary: No focal liver abnormality is seen. No gallstones, gallbladder wall thickening, or biliary dilatation. Pancreas: Unremarkable. No pancreatic ductal dilatation or surrounding inflammatory changes. Spleen: Normal in size without focal abnormality. Adrenals/Urinary Tract: Adrenal glands are unremarkable. Kidneys are normal, without renal calculi, focal lesion, or hydronephrosis. Bladder is unremarkable. Stomach/Bowel: The stomach is mildly distended. There is a small bowel obstruction. The transition point is associated with the ventral hernia just below the umbilicus. There is an incarcerated loop of bowel in this region. Colonic diverticulosis is seen without diverticulitis. Visualized appendix is unremarkable. Vascular/Lymphatic: Atherosclerosis in the abdominal aorta and branching vessels. No adenopathy. Reproductive: 2 cm simple cyst in the right ovary. The adnexae and uterus are otherwise normal. Other: There is a fat containing umbilical hernia. There is also a ventral hernia inferior to the emboli  kiss which contains an incarcerated loop of small bowel. Musculoskeletal: Loss of height at L5 is likely osteoporotic from age-indeterminate compression fracture. However, I suspect  this is not acute. IMPRESSION: 1. Incarcerated loop of small bowel in a ventral hernia just inferior to the umbilicus resulting in a high-grade small bowel obstruction. 2. Mild atherosclerosis in the abdominal aorta. Electronically Signed   By: Gerome Samavid  Williams III M.D   On: 01/13/2016 15:44   Dg Abd 1 View  Result Date: 01/14/2016 CLINICAL DATA:  Nasogastric tube placement. EXAM: ABDOMEN - 1 VIEW COMPARISON:  CT abdomen and pelvis January 13, 2016 at 1534 hours FINDINGS: Nasogastric tube looped in proximal stomach, distal tip projecting at GE junction. Center loops of gas distended bowel corresponding to patient's known small bowel obstruction. No intra-abdominal mass effect or pathologic calcifications. Soft tissue planes and included osseous structures are unchanged. IMPRESSION: Nasogastric tube in proximal stomach. Electronically Signed   By: Awilda Metroourtnay  Bloomer M.D.   On: 01/14/2016 00:21     CBC  Recent Labs Lab 01/13/16 1426 01/14/16 0625  WBC 12.1* 9.3  HGB 14.9 12.0  HCT 44.6 36.2  PLT 373 273  MCV 92.4 92.4  MCH 30.8 30.5  MCHC 33.4 33.0  RDW 14.0 13.7    Chemistries   Recent Labs Lab 01/13/16 1426 01/13/16 1903 01/13/16 2341 01/14/16 0625  NA 131* 135 135 137  K 3.5 2.9* 3.9 3.3*  CL 83* 93* 95* 105  CO2 30 29 27 26   GLUCOSE 516* 258* 195* 209*  BUN 38* 37* 38* 28*  CREATININE 1.87* 1.55* 1.52* 1.02*  CALCIUM 10.1 8.9 8.6* 7.6*  MG  --   --   --  1.3*  AST 19  --   --   --   ALT 16  --   --   --   ALKPHOS 98  --   --   --   BILITOT 0.7  --   --   --    ------------------------------------------------------------------------------------------------------------------ estimated creatinine clearance is 38.6 mL/min (by C-G formula based on SCr of 1.02 mg/dL  (H)). ------------------------------------------------------------------------------------------------------------------ No results for input(s): HGBA1C in the last 72 hours. ------------------------------------------------------------------------------------------------------------------ No results for input(s): CHOL, HDL, LDLCALC, TRIG, CHOLHDL, LDLDIRECT in the last 72 hours. ------------------------------------------------------------------------------------------------------------------ No results for input(s): TSH, T4TOTAL, T3FREE, THYROIDAB in the last 72 hours.  Invalid input(s): FREET3 ------------------------------------------------------------------------------------------------------------------ No results for input(s): VITAMINB12, FOLATE, FERRITIN, TIBC, IRON, RETICCTPCT in the last 72 hours.  Coagulation profile No results for input(s): INR, PROTIME in the last 168 hours.  No results for input(s): DDIMER in the last 72 hours.  Cardiac Enzymes No results for input(s): CKMB, TROPONINI, MYOGLOBIN in the last 168 hours.  Invalid input(s): CK ------------------------------------------------------------------------------------------------------------------ Invalid input(s): POCBNP    Assessment & Plan   #Diabetic ketoacidosis secondary to nausea and vomiting from small bowel obstruction Resolved Started on low-dose Lantus Continue short acting insulin   #Essential hypertension Blood pressure is stable at this time and patient is nothing by mouth hold home medications We will provide IV Lopressor as needed basis  #Acute kidney injury secondary to dehydration from nausea and vomiting Continues to improve with IV hydration  #Small bowel obstruction from incarcerated ventral hernia Patient is admitted to surgical service Status post surgery   #Hyperlipidemia currently patient is nothing by mouth Check fasting lipid panel and hold home  medications   #Hypokalemia and hypomagnesemia I'll add potassium to the IV fluids give her a dose of magnesium Patient is full code, daughter is the healthcare power of attorney  GI prophylaxis with Protonix DVT prophylaxis with SCDs      Code Status Orders  Start     Ordered   01/13/16 1705  Full code  Continuous     01/13/16 1710    Code Status History    Date Active Date Inactive Code Status Order ID Comments User Context   This patient has a current code status but no historical code status.              DVT Prophylaxis  Lovenox   Lab Results  Component Value Date   PLT 273 01/14/2016     Time Spent in minutes   Greater than 50% of time spent in care coordination and counseling patient regarding the condition and plan of care.   Auburn Bilberry M.D on 01/14/2016 at 1:40 PM  Between 7am to 6pm - Pager - [phone]  After 6pm go to [email or web address] - password [email or web address] [facility]  [facility] [place] [facility]   Office  [phone]

## 2016-01-14 NOTE — Progress Notes (Signed)
Inpatient Diabetes Program Recommendations  AACE/ADA: New Consensus Statement on Inpatient Glycemic Control (2015)  Target Ranges:  Prepandial:   less than 140 mg/dL      Peak postprandial:   less than 180 mg/dL (1-2 hours)      Critically ill patients:  140 - 180 mg/dL   Results for [name], [name] (MRN [id number]) as of 01/14/2016 09:26  Ref. Range 01/13/2016 16:43 01/13/2016 18:12 01/13/2016 18:47 01/13/2016 19:56 01/13/2016 21:02 01/13/2016 22:12 01/14/2016 00:03 01/14/2016 03:08 01/14/2016 05:10 01/14/2016 07:41  Glucose-Capillary Latest Ref Range: 65 - 99 mg/dL 409401 (H) 811309 (H) 914261 (H) 185 (H) 149 (H) 142 (H) 203 (H) 171 (H) 237 (H) 157 (H)   Review of Glycemic Control  Diabetes history: DM2 Outpatient Diabetes medications: Amaryl 4 mg daily Current orders for Inpatient glycemic control: Novolog 0-9 units Q4H  Inpatient Diabetes Program Recommendations: Insulin - Basal: Please consider ordering Lantus 5 units Q24H. HgbA1C: A1c in process.  NOTE: Chart reviewed.  In reviewing chart, noted initial CO2 30, AG 18, and lab glucose 516 mg/dl at 78:2914:26 on 56/03/1310/3/17. Patient was started on an insulin drip and was transitioned off to only Novolog SQ Q4H. Recommend ordering low dose basal insulin and continue Novolog correction Q4H as ordered.  Thanks, Orlando PennerMarie Mariapaula Krist, RN, MSN, CDE Diabetes Coordinator Inpatient Diabetes Program [phone] (Team Pager from 8am to 5pm)

## 2016-01-14 NOTE — Transfer of Care (Signed)
Immediate Anesthesia Transfer of Care Note  Patient: [name]  Procedure(s) Performed: Procedure(s): HERNIA REPAIR VENTRAL ADULT WITH SMALL BOWEL RESECTION, LYSIS OF ADHESIONS (N/A)  Patient Location: PACU  Anesthesia Type:General  Level of Consciousness: awake  Airway & Oxygen Therapy: Patient Spontanous Breathing and Patient connected to face mask oxygen  Post-op Assessment: Report given to RN  Post vital signs: Reviewed and stable  Last Vitals:  Vitals:   01/14/16 0259 01/14/16 0300  BP: (!) 165/84 (!) 165/84  Pulse: 99 98  Resp: (!) 22 (!) 24  Temp: 36.1 C 36.4 C    Last Pain:  Vitals:   01/14/16 0000  TempSrc:   PainSc: 0-No pain         Complications: No apparent anesthesia complications

## 2016-01-14 NOTE — Progress Notes (Signed)
POD # O Doing well dka resolving VSS  PE NAD Abd: Dresing in place,no peritonitis  A/P keep ICU overnight xfer in am [employer] Keep ngt

## 2016-01-14 NOTE — Anesthesia Postprocedure Evaluation (Signed)
Anesthesia Post Note  Patient: [name] CreedHallie G Corona  Procedure(s) Performed: Procedure(s) (LRB): HERNIA REPAIR VENTRAL ADULT WITH SMALL BOWEL RESECTION, LYSIS OF ADHESIONS (N/A)  Patient location during evaluation: ICU Anesthesia Type: General Level of consciousness: awake and alert and oriented Pain management: satisfactory to patient Vital Signs Assessment: post-procedure vital signs reviewed and stable Respiratory status: respiratory function stable Cardiovascular status: stable Anesthetic complications: no    Last Vitals:  Vitals:   01/14/16 0500 01/14/16 0600  BP: 128/75 (!) 147/75  Pulse: 93 94  Resp: 17 20  Temp:      Last Pain:  Vitals:   01/14/16 0500  TempSrc:   PainSc: Thomasene RippleAsleep                 Flower Franko D

## 2016-01-14 NOTE — Progress Notes (Signed)
Chaplain rounded the unit to provide a compassionate presence and support to the patient and family. Patient appeared to be sleeping. Chaplain provided silent prayer. Jefm PettyChaplain Markie Frith [phone]

## 2016-01-14 NOTE — Progress Notes (Signed)
Gave report to Altria GroupMarcella RN. Patient going to room 229. Called daughter Shaune PascalCamilla, no answer on her number.  Patient is stable on room air, vital signs are within normal limits.

## 2016-01-15 ENCOUNTER — Encounter: Payer: Self-pay | Admitting: Surgery

## 2016-01-15 DIAGNOSIS — E440 Moderate protein-calorie malnutrition: Secondary | ICD-10-CM | POA: Insufficient documentation

## 2016-01-15 LAB — HEMOGLOBIN A1C
Hgb A1c MFr Bld: 8.5 % — ABNORMAL HIGH (ref 4.8–5.6)
MEAN PLASMA GLUCOSE: 197 mg/dL

## 2016-01-15 LAB — SURGICAL PATHOLOGY

## 2016-01-15 LAB — BASIC METABOLIC PANEL
Anion gap: 7 (ref 5–15)
BUN: 18 mg/dL (ref 6–20)
CHLORIDE: 106 mmol/L (ref 101–111)
CO2: 25 mmol/L (ref 22–32)
Calcium: 8 mg/dL — ABNORMAL LOW (ref 8.9–10.3)
Creatinine, Ser: 0.65 mg/dL (ref 0.44–1.00)
GFR calc Af Amer: >60 mL/min (ref >60)
GFR calc non Af Amer: >60 mL/min (ref >60)
GLUCOSE: 119 mg/dL — AB (ref 65–99)
POTASSIUM: 3.9 mmol/L (ref 3.5–5.1)
Sodium: 138 mmol/L (ref 135–145)

## 2016-01-15 LAB — MAGNESIUM: MAGNESIUM: 1.7 mg/dL (ref 1.7–2.4)

## 2016-01-15 LAB — GLUCOSE, CAPILLARY
GLUCOSE-CAPILLARY: 111 mg/dL — AB (ref 65–99)
GLUCOSE-CAPILLARY: 90 mg/dL (ref 65–99)
Glucose-Capillary: 103 mg/dL — ABNORMAL HIGH (ref 65–99)
Glucose-Capillary: 105 mg/dL — ABNORMAL HIGH (ref 65–99)
Glucose-Capillary: 134 mg/dL — ABNORMAL HIGH (ref 65–99)
Glucose-Capillary: 92 mg/dL (ref 65–99)

## 2016-01-15 MED ORDER — ORAL CARE MOUTH RINSE
15.0000 mL | Freq: Two times a day (BID) | OROMUCOSAL | Status: DC
  Administered 2016-01-15 – 2016-01-25 (×17): 15 mL via OROMUCOSAL

## 2016-01-15 MED ORDER — CHLORHEXIDINE GLUCONATE 0.12 % MT SOLN
15.0000 mL | Freq: Two times a day (BID) | OROMUCOSAL | Status: DC
  Administered 2016-01-15 – 2016-01-25 (×20): 15 mL via OROMUCOSAL
  Filled 2016-01-15 (×19): qty 15

## 2016-01-15 NOTE — Progress Notes (Signed)
CC: s/p lap sb resection incarcerated ventral hernia  Subjective: Feeling better  dka resolved AVSS Creat ok  Objective: Vital signs in last 24 hours: Temp:  [97.8 F (36.6 C)-99 F (37.2 C)] 98.3 F (36.8 C) (12/05 1258) Pulse Rate:  [101-111] 111 (12/05 1258) Resp:  [18-25] 18 (12/05 1258) BP: (129-153)/(70-88) 153/79 (12/05 1258) SpO2:  [95 %-99 %] 96 % (12/05 1258) Last BM Date: 01/10/16  Intake/Output from previous day: 12/04 0701 - 12/05 0700 In: 2775 [I.V.:2575; IV Piggyback:200] Out: 575 [Urine:125; Emesis/NG output:450] Intake/Output this shift: Total I/O In: 50 [IV Piggyback:50] Out: 200 [Emesis/NG output:200]  Physical exam: NAD  Abdf: soft, chronic epigastric hernia,  Recent laparotomy scar, no infection, no peritontis Lab Results: CBC   Recent Labs  01/13/16 1426 01/14/16 0625  WBC 12.1* 9.3  HGB 14.9 12.0  HCT 44.6 36.2  PLT 373 273   BMET  Recent Labs  01/14/16 0625 01/14/16 1401 01/15/16 0532  NA 137  --  138  K 3.3* 3.8 3.9  CL 105  --  106  CO2 26  --  25  GLUCOSE 209*  --  119*  BUN 28*  --  18  CREATININE 1.02*  --  0.65  CALCIUM 7.6*  --  8.0*   PT/INR No results for input(s): LABPROT, INR in the last 72 hours. ABG No results for input(s): PHART, HCO3 in the last 72 hours.  Invalid input(s): PCO2, PO2  Studies/Results: Ct Abdomen Pelvis Wo Contrast  Result Date: 01/13/2016 CLINICAL DATA:  Vomiting. EXAM: CT ABDOMEN AND PELVIS WITHOUT CONTRAST TECHNIQUE: Multidetector CT imaging of the abdomen and pelvis was performed following the standard protocol without IV contrast. COMPARISON:  None. FINDINGS: Lower chest: No acute abnormality. Hepatobiliary: No focal liver abnormality is seen. No gallstones, gallbladder wall thickening, or biliary dilatation. Pancreas: Unremarkable. No pancreatic ductal dilatation or surrounding inflammatory changes. Spleen: Normal in size without focal abnormality. Adrenals/Urinary Tract: Adrenal glands  are unremarkable. Kidneys are normal, without renal calculi, focal lesion, or hydronephrosis. Bladder is unremarkable. Stomach/Bowel: The stomach is mildly distended. There is a small bowel obstruction. The transition point is associated with the ventral hernia just below the umbilicus. There is an incarcerated loop of bowel in this region. Colonic diverticulosis is seen without diverticulitis. Visualized appendix is unremarkable. Vascular/Lymphatic: Atherosclerosis in the abdominal aorta and branching vessels. No adenopathy. Reproductive: 2 cm simple cyst in the right ovary. The adnexae and uterus are otherwise normal. Other: There is a fat containing umbilical hernia. There is also a ventral hernia inferior to the emboli kiss which contains an incarcerated loop of small bowel. Musculoskeletal: Loss of height at L5 is likely osteoporotic from age-indeterminate compression fracture. However, I suspect this is not acute. IMPRESSION: 1. Incarcerated loop of small bowel in a ventral hernia just inferior to the umbilicus resulting in a high-grade small bowel obstruction. 2. Mild atherosclerosis in the abdominal aorta. Electronically Signed   By: Gerome Samavid  Williams III M.D   On: 01/13/2016 15:44   Dg Abd 1 View  Result Date: 01/14/2016 CLINICAL DATA:  Nasogastric tube placement. EXAM: ABDOMEN - 1 VIEW COMPARISON:  CT abdomen and pelvis January 13, 2016 at 1534 hours FINDINGS: Nasogastric tube looped in proximal stomach, distal tip projecting at GE junction. Center loops of gas distended bowel corresponding to patient's known small bowel obstruction. No intra-abdominal mass effect or pathologic calcifications. Soft tissue planes and included osseous structures are unchanged. IMPRESSION: Nasogastric tube in proximal stomach. Electronically Signed   By:  Awilda Metroourtnay  Bloomer M.D.   On: 01/14/2016 00:21    Anti-infectives: Anti-infectives    Start     Dose/Rate Route Frequency Ordered Stop   01/15/16 1000  ertapenem  (INVANZ) 0.5 g in sodium chloride 0.9 % 50 mL IVPB  Status:  Discontinued     500 mg 100 mL/hr over 30 Minutes Intravenous Every 24 hours 01/14/16 0542 01/14/16 1346   01/15/16 1000  ertapenem (INVANZ) 1 g in sodium chloride 0.9 % 50 mL IVPB     1 g 100 mL/hr over 30 Minutes Intravenous Every 24 hours 01/14/16 1346     01/15/16 0000  ertapenem (INVANZ) 1 g in sodium chloride 0.9 % 50 mL IVPB  Status:  Discontinued     1 g 100 mL/hr over 30 Minutes Intravenous Every 24 hours 01/14/16 0454 01/14/16 0541   01/14/16 0215  ertapenem (INVANZ) 1 g in sodium chloride 0.9 % 50 mL IVPB     1 g 100 mL/hr over 30 Minutes Intravenous  Once 01/14/16 0214 01/14/16 0330      Assessment/Plan: Doing well Post op ileus, continue NGT mobilize  Sterling Bigiego Jeanenne Licea, MD, FACS  01/15/2016

## 2016-01-15 NOTE — Progress Notes (Signed)
[employer] - [place] at [facility]                                                                                                                                                                                  Patient Demographics   [name], is a 82 y.o. female, DOB - [birth year], WGN:[id number]  Admit date - 01/13/2016   Admitting Physician Henrene DodgeJose Piscoya, MD  Outpatient Primary MD for the patient is Singh,Jasmine, MD   LOS - 2  Subjective: Abdominal pain is improved but has significant NG tube drainage    Review of Systems:   CONSTITUTIONAL: No documented fever. No fatigue, weakness. No weight gain, no weight loss.  EYES: No blurry or double vision.  ENT: No tinnitus. No postnasal drip. No redness of the oropharynx.  RESPIRATORY: No cough, no wheeze, no hemoptysis. No dyspnea.  CARDIOVASCULAR: No chest pain. No orthopnea. No palpitations. No syncope.  GASTROINTESTINAL: No nausea, no vomiting or diarrhea. Positive abdominal pain. No melena or hematochezia.  GENITOURINARY: No dysuria or hematuria.  ENDOCRINE: No polyuria or nocturia. No heat or cold intolerance.  HEMATOLOGY: No anemia. No bruising. No bleeding.  INTEGUMENTARY: No rashes. No lesions.  MUSCULOSKELETAL: No arthritis. No swelling. No gout.  NEUROLOGIC: No numbness, tingling, or ataxia. No seizure-type activity.  PSYCHIATRIC: No anxiety. No insomnia. No ADD.    Vitals:   Vitals:   01/14/16 2200 01/14/16 2333 01/15/16 0428 01/15/16 1258  BP: 136/70 (!) 152/79 (!) 147/75 (!) 153/79  Pulse: (!) 108 (!) 106 (!) 101 (!) 111  Resp: (!) 24 18 18 18   Temp: 98.2 F (36.8 C) 98.3 F (36.8 C) 97.9 F (36.6 C) 98.3 F (36.8 C)  TempSrc: Oral Oral Oral Oral  SpO2: 96% 99% 98% 96%  Weight:      Height:        Wt Readings from Last 3 Encounters:  01/13/16 140 lb 6.9 oz (63.7 kg)     Intake/Output Summary (Last 24 hours) at 01/15/16 1502 Last data filed at 01/15/16 1038  Gross per 24 hour   Intake             1770 ml  Output              650 ml  Net             1120 ml    Physical Exam:   GENERAL: Pleasant-appearing in no apparent distress.  HEAD, EYES, EARS, NOSE AND THROAT: Atraumatic, normocephalic. Extraocular muscles are intact. Pupils equal and reactive to light. Sclerae anicteric. No conjunctival injection. No oro-pharyngeal erythema.  NECK: Supple. There is no jugular venous distention. No bruits, no lymphadenopathy,  no thyromegaly.  HEART: Regular rate and rhythm,. No murmurs, no rubs, no clicks.  LUNGS: Clear to auscultation bilaterally. No rales or rhonchi. No wheezes.  ABDOMEN: Postop  EXTREMITIES: No evidence of any cyanosis, clubbing, or peripheral edema.  +2 pedal and radial pulses bilaterally.  NEUROLOGIC: The patient is alert, awake, and oriented x3 with no focal motor or sensory deficits appreciated bilaterally.  SKIN: Moist and warm with no rashes appreciated.  Psych: Not anxious, depressed LN: No inguinal LN enlargement    Antibiotics   Anti-infectives    Start     Dose/Rate Route Frequency Ordered Stop   01/15/16 1000  ertapenem (INVANZ) 0.5 g in sodium chloride 0.9 % 50 mL IVPB  Status:  Discontinued     500 mg 100 mL/hr over 30 Minutes Intravenous Every 24 hours 01/14/16 0542 01/14/16 1346   01/15/16 1000  ertapenem (INVANZ) 1 g in sodium chloride 0.9 % 50 mL IVPB     1 g 100 mL/hr over 30 Minutes Intravenous Every 24 hours 01/14/16 1346     01/15/16 0000  ertapenem (INVANZ) 1 g in sodium chloride 0.9 % 50 mL IVPB  Status:  Discontinued     1 g 100 mL/hr over 30 Minutes Intravenous Every 24 hours 01/14/16 0454 01/14/16 0541   01/14/16 0215  ertapenem (INVANZ) 1 g in sodium chloride 0.9 % 50 mL IVPB     1 g 100 mL/hr over 30 Minutes Intravenous  Once 01/14/16 0214 01/14/16 0330      Medications   Scheduled Meds: . chlorhexidine  15 mL Mouth Rinse BID  . ertapenem  1 g Intravenous Q24H  . insulin aspart  0-9 Units Subcutaneous Q4H  .  insulin glargine  5 Units Subcutaneous QHS  . mouth rinse  15 mL Mouth Rinse q12n4p  . pantoprazole (PROTONIX) IV  40 mg Intravenous Q24H   Continuous Infusions: . 0.45 % NaCl with KCl 20 mEq / L 1,000 mL (01/15/16 0729)   PRN Meds:.hydrALAZINE, HYDROmorphone (DILAUDID) injection, metoprolol, ondansetron **OR** ondansetron (ZOFRAN) IV   Data Review:   Micro Results Recent Results (from the past 240 hour(s))  MRSA PCR Screening     Status: None   Collection Time: 01/13/16  6:39 PM  Result Value Ref Range Status   MRSA by PCR NEGATIVE NEGATIVE Final    Comment:        The GeneXpert MRSA Assay (FDA approved for NASAL specimens only), is one component of a comprehensive MRSA colonization surveillance program. It is not intended to diagnose MRSA infection nor to guide or monitor treatment for MRSA infections.     Radiology Reports Ct Abdomen Pelvis Wo Contrast  Result Date: 01/13/2016 CLINICAL DATA:  Vomiting. EXAM: CT ABDOMEN AND PELVIS WITHOUT CONTRAST TECHNIQUE: Multidetector CT imaging of the abdomen and pelvis was performed following the standard protocol without IV contrast. COMPARISON:  None. FINDINGS: Lower chest: No acute abnormality. Hepatobiliary: No focal liver abnormality is seen. No gallstones, gallbladder wall thickening, or biliary dilatation. Pancreas: Unremarkable. No pancreatic ductal dilatation or surrounding inflammatory changes. Spleen: Normal in size without focal abnormality. Adrenals/Urinary Tract: Adrenal glands are unremarkable. Kidneys are normal, without renal calculi, focal lesion, or hydronephrosis. Bladder is unremarkable. Stomach/Bowel: The stomach is mildly distended. There is a small bowel obstruction. The transition point is associated with the ventral hernia just below the umbilicus. There is an incarcerated loop of bowel in this region. Colonic diverticulosis is seen without diverticulitis. Visualized appendix is unremarkable. Vascular/Lymphatic:  Atherosclerosis in  the abdominal aorta and branching vessels. No adenopathy. Reproductive: 2 cm simple cyst in the right ovary. The adnexae and uterus are otherwise normal. Other: There is a fat containing umbilical hernia. There is also a ventral hernia inferior to the emboli kiss which contains an incarcerated loop of small bowel. Musculoskeletal: Loss of height at L5 is likely osteoporotic from age-indeterminate compression fracture. However, I suspect this is not acute. IMPRESSION: 1. Incarcerated loop of small bowel in a ventral hernia just inferior to the umbilicus resulting in a high-grade small bowel obstruction. 2. Mild atherosclerosis in the abdominal aorta. Electronically Signed   By: Gerome Sam III M.D   On: 01/13/2016 15:44   Dg Abd 1 View  Result Date: 01/14/2016 CLINICAL DATA:  Nasogastric tube placement. EXAM: ABDOMEN - 1 VIEW COMPARISON:  CT abdomen and pelvis January 13, 2016 at 1534 hours FINDINGS: Nasogastric tube looped in proximal stomach, distal tip projecting at GE junction. Center loops of gas distended bowel corresponding to patient's known small bowel obstruction. No intra-abdominal mass effect or pathologic calcifications. Soft tissue planes and included osseous structures are unchanged. IMPRESSION: Nasogastric tube in proximal stomach. Electronically Signed   By: Awilda Metro M.D.   On: 01/14/2016 00:21     CBC  Recent Labs Lab 01/13/16 1426 01/14/16 0625  WBC 12.1* 9.3  HGB 14.9 12.0  HCT 44.6 36.2  PLT 373 273  MCV 92.4 92.4  MCH 30.8 30.5  MCHC 33.4 33.0  RDW 14.0 13.7    Chemistries   Recent Labs Lab 01/13/16 1426 01/13/16 1903 01/13/16 2341 01/14/16 0625 01/14/16 1401 01/15/16 0532  NA 131* 135 135 137  --  138  K 3.5 2.9* 3.9 3.3* 3.8 3.9  CL 83* 93* 95* 105  --  106  CO2 30 29 27 26   --  25  GLUCOSE 516* 258* 195* 209*  --  119*  BUN 38* 37* 38* 28*  --  18  CREATININE 1.87* 1.55* 1.52* 1.02*  --  0.65  CALCIUM 10.1 8.9 8.6* 7.6*   --  8.0*  MG  --   --   --  1.3* 1.7 1.7  AST 19  --   --   --   --   --   ALT 16  --   --   --   --   --   ALKPHOS 98  --   --   --   --   --   BILITOT 0.7  --   --   --   --   --    ------------------------------------------------------------------------------------------------------------------ estimated creatinine clearance is 49.2 mL/min (by C-G formula based on SCr of 0.65 mg/dL). ------------------------------------------------------------------------------------------------------------------  Recent Labs  01/13/16 1426  HGBA1C 8.5*   ------------------------------------------------------------------------------------------------------------------ No results for input(s): CHOL, HDL, LDLCALC, TRIG, CHOLHDL, LDLDIRECT in the last 72 hours. ------------------------------------------------------------------------------------------------------------------ No results for input(s): TSH, T4TOTAL, T3FREE, THYROIDAB in the last 72 hours.  Invalid input(s): FREET3 ------------------------------------------------------------------------------------------------------------------ No results for input(s): VITAMINB12, FOLATE, FERRITIN, TIBC, IRON, RETICCTPCT in the last 72 hours.  Coagulation profile No results for input(s): INR, PROTIME in the last 168 hours.  No results for input(s): DDIMER in the last 72 hours.  Cardiac Enzymes No results for input(s): CKMB, TROPONINI, MYOGLOBIN in the last 168 hours.  Invalid input(s): CK ------------------------------------------------------------------------------------------------------------------ Invalid input(s): POCBNP    Assessment & Plan   #Diabetic ketoacidosis secondary to nausea and vomiting from small bowel obstruction Resolved Continue low-dose Lantus Continue short acting insulin   #Essential  hypertension Blood pressure is stable at this time and patient is nothing by mouth hold home medications IV Lopressor as  needed  #Acute kidney injury secondary to dehydration from nausea and vomiting Resolved  #Small bowel obstruction from incarcerated ventral hernia Status post surgery As per surgery  #Hyperlipidemia currently patient is nothing by mouth Home medications on hold  #Hypokalemia and hypomagnesemia these have been replaced we'll continue to monitor  GI prophylaxis with Protonix DVT prophylaxis with SCDs      Code Status Orders        Start     Ordered   01/13/16 1705  Full code  Continuous     01/13/16 1710    Code Status History    Date Active Date Inactive Code Status Order ID Comments User Context   This patient has a current code status but no historical code status.              DVT Prophylaxis  Lovenox   Lab Results  Component Value Date   PLT 273 01/14/2016     Time Spent in minutes  32min  Greater than 50% of time spent in care coordination and counseling patient regarding the condition and plan of care.   Auburn BilberryPATEL, Lauren Aguayo M.D on 01/15/2016 at 3:02 PM  Between 7am to 6pm - Pager - [phone]  After 6pm go to [email or web address] - password [email or web address] [facility]  [facility]   Office  [phone]

## 2016-01-15 NOTE — Progress Notes (Signed)
Initial Nutrition Assessment  DOCUMENTATION CODES:   Non-severe (moderate) malnutrition in context of acute illness/injury  INTERVENTION:  -Await diet progression -Recommend addition of nutritional supplement once diet advanced  NUTRITION DIAGNOSIS:   Malnutrition related to acute illness as evidenced by mild depletion of muscle mass, percent weight loss, energy intake < 75% for > 7 days.  GOAL:   Patient will meet greater than or equal to 90% of their needs  MONITOR:   Diet advancement, Labs, Weight trends  REASON FOR ASSESSMENT:   Malnutrition Screening Tool    ASSESSMENT:    82 yo female admitted with SBO due to strangulated ventral hernia s/p lysis of adhesions with SB resection with anastamosis. Pt also in DKA and ARF on admission. Pt with hx of DM, HTN, HL, diverticulitis   NG to LCS with bright green output, 400 mL in cannister. Pt with no c/o of N/V/abdominal pain on visit today.  Pt reports no appetite currently but typically has very good appetite. Pt reports in the last week or so not eating well  Pt reports UBW around 153 pounds; current wt 140 pounds. 8.5% wt loss. Pt reports she thinks she weighed 153 pounds less than 1 month ago.   Nutrition-Focused physical exam completed. Findings are no fat depletion, mild muscle depletion, and no edema.   Labs: reviewed, blood glucose improved Meds: 1/2 NS with KCl at 100 ml/hr, ss novolog, lantus  Diet Order:  Diet NPO time specified  Skin:  Reviewed, no issues  Last BM:  11/30  Height:   Ht Readings from Last 1 Encounters:  01/13/16 5\' 4"  (1.626 m)    Weight:   Wt Readings from Last 1 Encounters:  01/13/16 140 lb 6.9 oz (63.7 kg)    BMI:  Body mass index is 24.11 kg/m.  Estimated Nutritional Needs:   Kcal:  1700-1900 kcals  Protein:  85-95 g  Fluid:  >/= 1.7 L  EDUCATION NEEDS:   No education needs identified at this time  Romelle StarcherCate Kaleem Sartwell MS, RD, LDN [phone] Pager  [phone]  Weekend/On-Call Pager

## 2016-01-16 ENCOUNTER — Inpatient Hospital Stay: Payer: Medicare Other

## 2016-01-16 LAB — GLUCOSE, CAPILLARY
GLUCOSE-CAPILLARY: 74 mg/dL (ref 65–99)
GLUCOSE-CAPILLARY: 82 mg/dL (ref 65–99)
GLUCOSE-CAPILLARY: 135 mg/dL — AB (ref 65–99)
Glucose-Capillary: 87 mg/dL (ref 65–99)
Glucose-Capillary: 84 mg/dL (ref 65–99)
Glucose-Capillary: 81 mg/dL (ref 65–99)

## 2016-01-16 LAB — CBC
HEMATOCRIT: 34.7 % — AB (ref 35.0–47.0)
HEMOGLOBIN: 11.7 g/dL — AB (ref 12.0–16.0)
MCH: 31.2 pg (ref 26.0–34.0)
MCHC: 33.8 g/dL (ref 32.0–36.0)
MCV: 92.4 fL (ref 80.0–100.0)
Platelets: 306 10*3/uL (ref 150–440)
RBC: 3.76 MIL/uL — ABNORMAL LOW (ref 3.80–5.20)
RDW: 13.9 % (ref 11.5–14.5)
WBC: 15.6 10*3/uL — ABNORMAL HIGH (ref 3.6–11.0)

## 2016-01-16 LAB — BASIC METABOLIC PANEL
Anion gap: 12 (ref 5–15)
BUN: 10 mg/dL (ref 6–20)
CHLORIDE: 104 mmol/L (ref 101–111)
CO2: 21 mmol/L — AB (ref 22–32)
CREATININE: 0.58 mg/dL (ref 0.44–1.00)
Calcium: 8.1 mg/dL — ABNORMAL LOW (ref 8.9–10.3)
GFR calc non Af Amer: >60 mL/min (ref >60)
GLUCOSE: 86 mg/dL (ref 65–99)
Potassium: 3.7 mmol/L (ref 3.5–5.1)
Sodium: 137 mmol/L (ref 135–145)

## 2016-01-16 LAB — PHOSPHORUS: Phosphorus: 2 mg/dL — ABNORMAL LOW (ref 2.5–4.6)

## 2016-01-16 LAB — MAGNESIUM: MAGNESIUM: 1.5 mg/dL — AB (ref 1.7–2.4)

## 2016-01-16 MED ORDER — MENTHOL 3 MG MT LOZG
1.0000 | LOZENGE | OROMUCOSAL | Status: DC | PRN
  Filled 2016-01-16: qty 9

## 2016-01-16 MED ORDER — POTASSIUM PHOSPHATES 15 MMOLE/5ML IV SOLN
15.0000 mmol | Freq: Once | INTRAVENOUS | Status: AC
  Administered 2016-01-16: 15 mmol via INTRAVENOUS
  Filled 2016-01-16: qty 5

## 2016-01-16 MED ORDER — TRACE MINERALS CR-CU-MN-SE-ZN 10-1000-500-60 MCG/ML IV SOLN
INTRAVENOUS | Status: AC
  Administered 2016-01-16: 19:00:00 via INTRAVENOUS
  Filled 2016-01-16: qty 840

## 2016-01-16 MED ORDER — MAGNESIUM SULFATE 2 GM/50ML IV SOLN
2.0000 g | Freq: Once | INTRAVENOUS | Status: AC
  Administered 2016-01-16: 2 g via INTRAVENOUS
  Filled 2016-01-16: qty 50

## 2016-01-16 MED ORDER — POTASSIUM CHLORIDE IN NACL 20-0.45 MEQ/L-% IV SOLN
INTRAVENOUS | Status: DC
  Administered 2016-01-16 – 2016-01-17 (×2): via INTRAVENOUS
  Filled 2016-01-16 (×3): qty 1000

## 2016-01-16 NOTE — Progress Notes (Signed)
Patient ambulated in the hallway three times today.. Tolerating walking with no s/s of respiratory distress noted.

## 2016-01-16 NOTE — Progress Notes (Signed)
POD # 2  Bowel resection No flatus  Persistent ileus Last Po 7 days ago per pt VSS mild tachy WBC elevated but clinically improving Minimal abd pain Creat ok  PE NAD Abd: distended, decrease BS, soft, incision c/d/i , no infection She does have a cronially incarcerated epigastric hernia away from current incision  A/P prolonged ileus, start TPN and Picc line Mobilize Monitor wbc no evidence of infection clinically, may have to repeat CT if wbc or tachy persists

## 2016-01-16 NOTE — Progress Notes (Signed)
[employer] [facility] [place] [facility]                                                                                                                                                                                  Patient Demographics   [name], is a 82 y.o. female, DOB - Jun 06, 1933, ZOX:[id number]  Admit date - 01/13/2016   Admitting Physician Henrene DodgeJose Piscoya, MD  Outpatient Primary MD for the patient is Singh,Jasmine, MD   LOS - 3  Subjective: Patient still has significant NG tube drainage and denies any shortness of breath or chest pain    Review of Systems:   CONSTITUTIONAL: No documented fever. No fatigue, weakness. No weight gain, no weight loss.  EYES: No blurry or double vision.  ENT: No tinnitus. No postnasal drip. No redness of the oropharynx.  RESPIRATORY: No cough, no wheeze, no hemoptysis. No dyspnea.  CARDIOVASCULAR: No chest pain. No orthopnea. No palpitations. No syncope.  GASTROINTESTINAL: No nausea, no vomiting or diarrhea. Positive abdominal pain. No melena or hematochezia.  GENITOURINARY: No dysuria or hematuria.  ENDOCRINE: No polyuria or nocturia. No heat or cold intolerance.  HEMATOLOGY: No anemia. No bruising. No bleeding.  INTEGUMENTARY: No rashes. No lesions.  MUSCULOSKELETAL: No arthritis. No swelling. No gout.  NEUROLOGIC: No numbness, tingling, or ataxia. No seizure-type activity.  PSYCHIATRIC: No anxiety. No insomnia. No ADD.    Vitals:   Vitals:   01/15/16 2020 01/16/16 0625 01/16/16 0830 01/16/16 1252  BP: (!) 158/71 (!) 150/88 (!) 144/73 (!) 154/80  Pulse: 81 (!) 134 (!) 107 (!) 107  Resp: 20  (!) 22 17  Temp: 99.5 F (37.5 C) 98.4 F (36.9 C) 97.8 F (36.6 C) 98.2 F (36.8 C)  TempSrc: Oral Oral Oral Oral  SpO2: 95% 98% 98% 99%  Weight:      Height:        Wt Readings from Last 3 Encounters:  01/13/16 140 lb 6.9 oz (63.7 kg)     Intake/Output Summary (Last 24 hours) at 01/16/16 1524 Last data filed at  01/16/16 0104  Gross per 24 hour  Intake              565 ml  Output              150 ml  Net              415 ml    Physical Exam:   GENERAL: Pleasant-appearing in no apparent distress.  HEAD, EYES, EARS, NOSE AND THROAT: Atraumatic, normocephalic. Extraocular muscles are intact. Pupils equal and reactive to light. Sclerae anicteric. No conjunctival injection. No oro-pharyngeal erythema.  NECK: Supple. There is  no jugular venous distention. No bruits, no lymphadenopathy, no thyromegaly.  HEART: Regular rate and rhythm,. No murmurs, no rubs, no clicks.  LUNGS: Clear to auscultation bilaterally. No rales or rhonchi. No wheezes.  ABDOMEN: Postop nontender EXTREMITIES: No evidence of any cyanosis, clubbing, or peripheral edema.  +2 pedal and radial pulses bilaterally.  NEUROLOGIC: The patient is alert, awake, and oriented x3 with no focal motor or sensory deficits appreciated bilaterally.  SKIN: Moist and warm with no rashes appreciated.  Psych: Not anxious, depressed LN: No inguinal LN enlargement    Antibiotics   Anti-infectives    Start     Dose/Rate Route Frequency Ordered Stop   01/15/16 1000  ertapenem (INVANZ) 0.5 g in sodium chloride 0.9 % 50 mL IVPB  Status:  Discontinued     500 mg 100 mL/hr over 30 Minutes Intravenous Every 24 hours 01/14/16 0542 01/14/16 1346   01/15/16 1000  ertapenem (INVANZ) 1 g in sodium chloride 0.9 % 50 mL IVPB     1 g 100 mL/hr over 30 Minutes Intravenous Every 24 hours 01/14/16 1346     01/15/16 0000  ertapenem (INVANZ) 1 g in sodium chloride 0.9 % 50 mL IVPB  Status:  Discontinued     1 g 100 mL/hr over 30 Minutes Intravenous Every 24 hours 01/14/16 0454 01/14/16 0541   01/14/16 0215  ertapenem (INVANZ) 1 g in sodium chloride 0.9 % 50 mL IVPB     1 g 100 mL/hr over 30 Minutes Intravenous  Once 01/14/16 0214 01/14/16 0330      Medications   Scheduled Meds: . chlorhexidine  15 mL Mouth Rinse BID  . ertapenem  1 g Intravenous Q24H  .  insulin aspart  0-9 Units Subcutaneous Q4H  . insulin glargine  5 Units Subcutaneous QHS  . mouth rinse  15 mL Mouth Rinse q12n4p  . pantoprazole (PROTONIX) IV  40 mg Intravenous Q24H  . potassium phosphate IVPB (mmol)  15 mmol Intravenous Once   Continuous Infusions: . 0.45 % NaCl with KCl 20 mEq / L 100 mL/hr at 01/16/16 0944  . 0.45 % NaCl with KCl 20 mEq / L     PRN Meds:.hydrALAZINE, HYDROmorphone (DILAUDID) injection, metoprolol, ondansetron **OR** ondansetron (ZOFRAN) IV   Data Review:   Micro Results Recent Results (from the past 240 hour(s))  MRSA PCR Screening     Status: None   Collection Time: 01/13/16  6:39 PM  Result Value Ref Range Status   MRSA by PCR NEGATIVE NEGATIVE Final    Comment:        The GeneXpert MRSA Assay (FDA approved for NASAL specimens only), is one component of a comprehensive MRSA colonization surveillance program. It is not intended to diagnose MRSA infection nor to guide or monitor treatment for MRSA infections.     Radiology Reports Ct Abdomen Pelvis Wo Contrast  Result Date: 01/13/2016 CLINICAL DATA:  Vomiting. EXAM: CT ABDOMEN AND PELVIS WITHOUT CONTRAST TECHNIQUE: Multidetector CT imaging of the abdomen and pelvis was performed following the standard protocol without IV contrast. COMPARISON:  None. FINDINGS: Lower chest: No acute abnormality. Hepatobiliary: No focal liver abnormality is seen. No gallstones, gallbladder wall thickening, or biliary dilatation. Pancreas: Unremarkable. No pancreatic ductal dilatation or surrounding inflammatory changes. Spleen: Normal in size without focal abnormality. Adrenals/Urinary Tract: Adrenal glands are unremarkable. Kidneys are normal, without renal calculi, focal lesion, or hydronephrosis. Bladder is unremarkable. Stomach/Bowel: The stomach is mildly distended. There is a small bowel obstruction. The transition point is  associated with the ventral hernia just below the umbilicus. There is an  incarcerated loop of bowel in this region. Colonic diverticulosis is seen without diverticulitis. Visualized appendix is unremarkable. Vascular/Lymphatic: Atherosclerosis in the abdominal aorta and branching vessels. No adenopathy. Reproductive: 2 cm simple cyst in the right ovary. The adnexae and uterus are otherwise normal. Other: There is a fat containing umbilical hernia. There is also a ventral hernia inferior to the emboli kiss which contains an incarcerated loop of small bowel. Musculoskeletal: Loss of height at L5 is likely osteoporotic from age-indeterminate compression fracture. However, I suspect this is not acute. IMPRESSION: 1. Incarcerated loop of small bowel in a ventral hernia just inferior to the umbilicus resulting in a high-grade small bowel obstruction. 2. Mild atherosclerosis in the abdominal aorta. Electronically Signed   By: Gerome Sam III M.D   On: 01/13/2016 15:44   Dg Abd 1 View  Result Date: 01/14/2016 CLINICAL DATA:  Nasogastric tube placement. EXAM: ABDOMEN - 1 VIEW COMPARISON:  CT abdomen and pelvis January 13, 2016 at 1534 hours FINDINGS: Nasogastric tube looped in proximal stomach, distal tip projecting at GE junction. Center loops of gas distended bowel corresponding to patient's known small bowel obstruction. No intra-abdominal mass effect or pathologic calcifications. Soft tissue planes and included osseous structures are unchanged. IMPRESSION: Nasogastric tube in proximal stomach. Electronically Signed   By: Awilda Metro M.D.   On: 01/14/2016 00:21   Dg Chest Port 1 View  Result Date: 01/16/2016 CLINICAL DATA:  Status post PICC line placement EXAM: PORTABLE CHEST 1 VIEW COMPARISON:  01/16/2016 FINDINGS: Right-sided PICC line is now seen with the catheter tip at the cavoatrial junction. A nasogastric catheter is again noted within the stomach. The lungs are clear. Cardiac shadow is stable. Degenerative change of the thoracic spine is noted. IMPRESSION: Status post  PICC line in satisfactory position. Electronically Signed   By: Alcide Clever M.D.   On: 01/16/2016 12:38   Dg Abd Acute W/chest  Result Date: 01/16/2016 CLINICAL DATA:  Followup adynamic ileus. EXAM: DG ABDOMEN ACUTE W/ 1V CHEST COMPARISON:  01/13/2016 FINDINGS: There is decreased air within the small bowel a comparative recent prior exam. Small bowel does visible appears nondilated. No free air. Skin staples extend along the low anterior midline, new from the prior exam. A nasogastric tube has its tip projecting in the distal stomach. Soft tissues are otherwise unremarkable. There is linear atelectasis at the lung bases associated with low lung volumes. Lungs are otherwise clear. IMPRESSION: 1. No radiographic findings of a bowel obstruction or significant adynamic ileus. No free air. 2. Mild lung base atelectasis.  No acute cardiopulmonary disease. Electronically Signed   By: Amie Portland M.D.   On: 01/16/2016 08:39     CBC  Recent Labs Lab 01/13/16 1426 01/14/16 0625 01/16/16 0443  WBC 12.1* 9.3 15.6*  HGB 14.9 12.0 11.7*  HCT 44.6 36.2 34.7*  PLT 373 273 306  MCV 92.4 92.4 92.4  MCH 30.8 30.5 31.2  MCHC 33.4 33.0 33.8  RDW 14.0 13.7 13.9    Chemistries   Recent Labs Lab 01/13/16 1426 01/13/16 1903 01/13/16 2341 01/14/16 0625 01/14/16 1401 01/15/16 0532 01/16/16 0443  NA 131* 135 135 137  --  138 137  K 3.5 2.9* 3.9 3.3* 3.8 3.9 3.7  CL 83* 93* 95* 105  --  106 104  CO2 30 29 27 26   --  25 21*  GLUCOSE 516* 258* 195* 209*  --  119* 86  BUN 38* 37* 38* 28*  --  18 10  CREATININE 1.87* 1.55* 1.52* 1.02*  --  0.65 0.58  CALCIUM 10.1 8.9 8.6* 7.6*  --  8.0* 8.1*  MG  --   --   --  1.3* 1.7 1.7 1.5*  AST 19  --   --   --   --   --   --   ALT 16  --   --   --   --   --   --   ALKPHOS 98  --   --   --   --   --   --   BILITOT 0.7  --   --   --   --   --   --     ------------------------------------------------------------------------------------------------------------------ estimated creatinine clearance is 49.2 mL/min (by C-G formula based on SCr of 0.58 mg/dL). ------------------------------------------------------------------------------------------------------------------ No results for input(s): HGBA1C in the last 72 hours. ------------------------------------------------------------------------------------------------------------------ No results for input(s): CHOL, HDL, LDLCALC, TRIG, CHOLHDL, LDLDIRECT in the last 72 hours. ------------------------------------------------------------------------------------------------------------------ No results for input(s): TSH, T4TOTAL, T3FREE, THYROIDAB in the last 72 hours.  Invalid input(s): FREET3 ------------------------------------------------------------------------------------------------------------------ No results for input(s): VITAMINB12, FOLATE, FERRITIN, TIBC, IRON, RETICCTPCT in the last 72 hours.  Coagulation profile No results for input(s): INR, PROTIME in the last 168 hours.  No results for input(s): DDIMER in the last 72 hours.  Cardiac Enzymes No results for input(s): CKMB, TROPONINI, MYOGLOBIN in the last 168 hours.  Invalid input(s): CK ------------------------------------------------------------------------------------------------------------------ Invalid input(s): POCBNP    Assessment & Plan   #Diabetic ketoacidosis secondary to nausea and vomiting from small bowel obstruction Resolved Continue low-dose Lantus, patient will be started on TPN will need higher dose will continue to monitor blood sugar Continue short acting insulin   #Essential hypertension- pressure currently stable Blood pressure is stable at this time and patient is nothing by mouth hold home medications IV Lopressor as needed  #Acute kidney injury secondary to dehydration from nausea and  vomiting Resolved  #Small bowel obstruction from incarcerated ventral hernia Status post surgery Continue current management  #Hyperlipidemia currently patient is nothing by mouth Home medications on hold  #Hypokalemia and hypomagnesemia replaced  GI prophylaxis with Protonix DVT prophylaxis with SCDs      Code Status Orders        Start     Ordered   01/13/16 1705  Full code  Continuous     01/13/16 1710    Code Status History    Date Active Date Inactive Code Status Order ID Comments User Context   This patient has a current code status but no historical code status.              DVT Prophylaxis  Lovenox   Lab Results  Component Value Date   PLT 306 01/16/2016     Time Spent in minutes  32min  Greater than 50% of time spent in care coordination and counseling patient regarding the condition and plan of care.   Auburn BilberryPATEL, Nyajah Hyson M.D on 01/16/2016 at 3:24 PM  Between 7am to 6pm - Pager - [phone]  After 6pm go to [email or web address] - password [email or web address] [facility]  [facility]   Office  [phone]

## 2016-01-16 NOTE — Progress Notes (Signed)
Nutrition Follow-up  DOCUMENTATION CODES:   Non-severe (moderate) malnutrition in context of acute illness/injury  INTERVENTION:  TPN not recommended at this time unless diet not expected to be advanced within 7 days. Per ASPEN Guidelines, TPN given less than 5-7 days is expected to have no outcome effect and may result in increased risk to the patient. Per Dr. Dahlia Byes patient has been NPO for 7 days at this point and will start TPN today.  Once PICC placed, recommend initiating Clinimix 5/15 @ 35 ml/hr. After 24 hours if electrolytes and CBGs stable, recommend advancing to goal rate of Clinimix 5/15 @ 70 ml/hr. If baseline Triglycerides WNL can initiate 20% ILE @ 20 ml/hr over 12 hours starting tomorrow. Goal regimen provides 1680 ml, 1673 kcal, and 84 grams protein daily.  In setting of post-op ileus recommend placement of NG tube and initiation of trickle feeds so patient can transition off TPN in timely manner in setting of national shortage.  NUTRITION DIAGNOSIS:   Malnutrition related to acute illness as evidenced by mild depletion of muscle mass, percent weight loss, energy intake < 75% for > 7 days.  Ongoing.  GOAL:   Patient will meet greater than or equal to 90% of their needs  Not met.  MONITOR:   Diet advancement, Labs, Weight trends  REASON FOR ASSESSMENT:   Malnutrition Screening Tool    ASSESSMENT:    82 yo female admitted with SBO due to strangulated ventral hernia s/p lysis of adhesions with SB resection with anastamosis. Pt also in DKA and ARF on admission. Pt with hx of DM, HTN, HL, diverticulitis  -Patient s/p ventral hernia repair, lysis of adhesion, small bowel resection with anastomosis on 12/4 (POD#2).  Per Surgery note, patient with post-op ileus. Noted order was placed this morning to insert PICC with double lumen.  Medications reviewed and include: Novolog sliding scale Q4hrs (none given in past 24 hrs), Lantus 5 units daily HS (none given past 24  hrs), pantoprazole, 1/2NS with KCl 20 mEq/L @ 100 ml/hr.  Labs reviewed: CBG 74-102 past 24 hrs, CO2 21.   Diet Order:  Diet NPO time specified  Skin:  Reviewed, no issues  Last BM:  11/30  Height:   Ht Readings from Last 1 Encounters:  01/13/16 5' 4"  (1.626 m)    Weight:   Wt Readings from Last 1 Encounters:  01/13/16 140 lb 6.9 oz (63.7 kg)    Ideal Body Weight:     BMI:  Body mass index is 24.11 kg/m.  Estimated Nutritional Needs:   Kcal:  1700-1900 kcals  Protein:  85-95 g  Fluid:  >/= 1.7 L  EDUCATION NEEDS:   No education needs identified at this time  Willey Blade, MS, RD, LDN Pager: [phone] After Hours Pager: [phone]

## 2016-01-16 NOTE — Progress Notes (Signed)
Spoke to Dr. Everlene FarrierPabon this morning during rounds and after xray Dr. Everlene FarrierPabon ordered for patient to a PICC line insertion for TPN. [facility] notified of the need for PICC line

## 2016-01-16 NOTE — Progress Notes (Signed)
PHARMACY - ADULT TOTAL PARENTERAL NUTRITION CONSULT NOTE   Pharmacy Consult for TPN Indication: postop ileus  Patient Measurements: Height: 5\' 4"  (162.6 cm) Weight: 140 lb 6.9 oz (63.7 kg) IBW/kg (Calculated) : 54.7 TPN AdjBW (KG): 63.7 Body mass index is 24.11 kg/m.   Assessment: 82 yo female starting on TPN for postop ileus. Currently NPO  Endo: Pt currently on SSI (sensitive scale) q4h and Latus 5 units qhs BG 87-103 Insulin requirements in the past 24 hours: 0 units Lytes: K 3.7, Phos 2.0, Mag 1.5   TPN Access: PICC TPN start date: 12/6  Nutritional Goals See dietician note 12/6  Current Nutrition: NPO  Plan:  Will order mag sulfate 2g IV x1 and KPhos 15 mmol IV x1 Labs ordered for AM, daily wts, strict I/O  Discussed with Dr Everlene FarrierPabon to decrease fluid (1/2NS with 20 K) rate from 100 ml/hr to 70 ml/hr when TPN starts tonight at 1800.   Start Clinimix E 5/15 at 35 ml/hr per dietician recommendations with 10 ml MVI and 1 ml trace elements in TPN 0 units of regular insulin in TPN Continue current SSI and Lantus orders; will adjust as needed Monitor TPN labs: TPN panel ordered for AM with TG  Pharmacy will continue to follow.   Crist FatWang, Ailey Wessling L 01/16/2016,12:56 PM

## 2016-01-17 ENCOUNTER — Inpatient Hospital Stay: Payer: Medicare Other

## 2016-01-17 LAB — MAGNESIUM: Magnesium: 1.6 mg/dL — ABNORMAL LOW (ref 1.7–2.4)

## 2016-01-17 LAB — TRIGLYCERIDES: TRIGLYCERIDES: 126 mg/dL (ref <150)

## 2016-01-17 LAB — POTASSIUM: POTASSIUM: 3.8 mmol/L (ref 3.5–5.1)

## 2016-01-17 LAB — COMPREHENSIVE METABOLIC PANEL
ALBUMIN: 2.3 g/dL — AB (ref 3.5–5.0)
ALK PHOS: 66 U/L (ref 38–126)
ALT: 9 U/L — AB (ref 14–54)
AST: 15 U/L (ref 15–41)
Anion gap: 8 (ref 5–15)
BILIRUBIN TOTAL: 1.4 mg/dL — AB (ref 0.3–1.2)
BUN: 9 mg/dL (ref 6–20)
CALCIUM: 8.1 mg/dL — AB (ref 8.9–10.3)
CO2: 27 mmol/L (ref 22–32)
CREATININE: 0.44 mg/dL (ref 0.44–1.00)
Chloride: 102 mmol/L (ref 101–111)
GFR calc Af Amer: >60 mL/min (ref >60)
GLUCOSE: 208 mg/dL — AB (ref 65–99)
Potassium: 3.6 mmol/L (ref 3.5–5.1)
Sodium: 137 mmol/L (ref 135–145)
TOTAL PROTEIN: 5.9 g/dL — AB (ref 6.5–8.1)

## 2016-01-17 LAB — CBC
HEMATOCRIT: 33.6 % — AB (ref 35.0–47.0)
HEMOGLOBIN: 11.1 g/dL — AB (ref 12.0–16.0)
MCH: 30.7 pg (ref 26.0–34.0)
MCHC: 32.9 g/dL (ref 32.0–36.0)
MCV: 93.4 fL (ref 80.0–100.0)
Platelets: 300 10*3/uL (ref 150–440)
RBC: 3.6 MIL/uL — ABNORMAL LOW (ref 3.80–5.20)
RDW: 14.2 % (ref 11.5–14.5)
WBC: 16.5 10*3/uL — ABNORMAL HIGH (ref 3.6–11.0)

## 2016-01-17 LAB — GLUCOSE, CAPILLARY
GLUCOSE-CAPILLARY: 183 mg/dL — AB (ref 65–99)
GLUCOSE-CAPILLARY: 191 mg/dL — AB (ref 65–99)
GLUCOSE-CAPILLARY: 240 mg/dL — AB (ref 65–99)
Glucose-Capillary: 164 mg/dL — ABNORMAL HIGH (ref 65–99)
Glucose-Capillary: 234 mg/dL — ABNORMAL HIGH (ref 65–99)
Glucose-Capillary: 251 mg/dL — ABNORMAL HIGH (ref 65–99)

## 2016-01-17 LAB — PHOSPHORUS
PHOSPHORUS: 2.6 mg/dL (ref 2.5–4.6)
Phosphorus: 2 mg/dL — ABNORMAL LOW (ref 2.5–4.6)

## 2016-01-17 LAB — PREALBUMIN: PREALBUMIN: 5.6 mg/dL — AB (ref 18–38)

## 2016-01-17 MED ORDER — ALBUTEROL SULFATE (2.5 MG/3ML) 0.083% IN NEBU
INHALATION_SOLUTION | RESPIRATORY_TRACT | Status: AC
Start: 2016-01-17 — End: 2016-01-17
  Filled 2016-01-17: qty 3

## 2016-01-17 MED ORDER — ALBUTEROL SULFATE (2.5 MG/3ML) 0.083% IN NEBU
3.0000 mL | INHALATION_SOLUTION | RESPIRATORY_TRACT | Status: DC | PRN
  Administered 2016-01-17: 3 mL via RESPIRATORY_TRACT

## 2016-01-17 MED ORDER — POTASSIUM CHLORIDE IN NACL 20-0.45 MEQ/L-% IV SOLN
INTRAVENOUS | Status: AC
  Filled 2016-01-17: qty 1000

## 2016-01-17 MED ORDER — IOPAMIDOL (ISOVUE-300) INJECTION 61%
100.0000 mL | Freq: Once | INTRAVENOUS | Status: AC | PRN
  Administered 2016-01-17: 100 mL via INTRAVENOUS

## 2016-01-17 MED ORDER — TRACE MINERALS CR-CU-MN-SE-ZN 10-1000-500-60 MCG/ML IV SOLN
INTRAVENOUS | Status: AC
  Administered 2016-01-17: 19:00:00 via INTRAVENOUS
  Filled 2016-01-17: qty 1680

## 2016-01-17 MED ORDER — FAT EMULSION 20 % IV EMUL
250.0000 mL | INTRAVENOUS | Status: AC
  Administered 2016-01-17: 250 mL via INTRAVENOUS
  Filled 2016-01-17: qty 250

## 2016-01-17 MED ORDER — IOPAMIDOL (ISOVUE-300) INJECTION 61%: 15.0000 mL | INTRAVENOUS | Status: DC

## 2016-01-17 MED ORDER — POTASSIUM PHOSPHATES 15 MMOLE/5ML IV SOLN
20.0000 mmol | Freq: Once | INTRAVENOUS | Status: AC
  Administered 2016-01-17: 20 mmol via INTRAVENOUS
  Filled 2016-01-17: qty 6.67

## 2016-01-17 MED ORDER — INSULIN GLARGINE 100 UNIT/ML ~~LOC~~ SOLN
10.0000 [IU] | Freq: Every day | SUBCUTANEOUS | Status: DC
  Administered 2016-01-17: 10 [IU] via SUBCUTANEOUS
  Filled 2016-01-17 (×2): qty 0.1

## 2016-01-17 MED ORDER — FAT EMULSION 20 % IV EMUL
250.0000 mL | INTRAVENOUS | Status: DC
  Filled 2016-01-17 (×2): qty 250

## 2016-01-17 MED ORDER — POTASSIUM CHLORIDE IN NACL 20-0.45 MEQ/L-% IV SOLN
INTRAVENOUS | Status: DC
  Administered 2016-01-17 – 2016-01-19 (×2): via INTRAVENOUS
  Filled 2016-01-17 (×2): qty 1000

## 2016-01-17 MED ORDER — IOPAMIDOL (ISOVUE-300) INJECTION 61%
15.0000 mL | INTRAVENOUS | Status: AC
  Administered 2016-01-17 (×2): 15 mL via ORAL

## 2016-01-17 MED ORDER — MAGNESIUM SULFATE 2 GM/50ML IV SOLN
2.0000 g | Freq: Once | INTRAVENOUS | Status: AC
  Administered 2016-01-17: 2 g via INTRAVENOUS
  Filled 2016-01-17: qty 50

## 2016-01-17 MED ORDER — INSULIN ASPART 100 UNIT/ML ~~LOC~~ SOLN
0.0000 [IU] | SUBCUTANEOUS | Status: DC
  Administered 2016-01-17 – 2016-01-18 (×3): 5 [IU] via SUBCUTANEOUS
  Administered 2016-01-18 (×2): 8 [IU] via SUBCUTANEOUS
  Administered 2016-01-18: 5 [IU] via SUBCUTANEOUS
  Administered 2016-01-18 (×2): 8 [IU] via SUBCUTANEOUS
  Administered 2016-01-18: 3 [IU] via SUBCUTANEOUS
  Administered 2016-01-19 (×2): 5 [IU] via SUBCUTANEOUS
  Administered 2016-01-19: 8 [IU] via SUBCUTANEOUS
  Administered 2016-01-19: 5 [IU] via SUBCUTANEOUS
  Administered 2016-01-19: 8 [IU] via SUBCUTANEOUS
  Administered 2016-01-20 (×6): 5 [IU] via SUBCUTANEOUS
  Administered 2016-01-21: 3 [IU] via SUBCUTANEOUS
  Administered 2016-01-21: 2 [IU] via SUBCUTANEOUS
  Administered 2016-01-21: 3 [IU] via SUBCUTANEOUS
  Administered 2016-01-21: 5 [IU] via SUBCUTANEOUS
  Administered 2016-01-21 – 2016-01-22 (×3): 3 [IU] via SUBCUTANEOUS
  Administered 2016-01-22: 5 [IU] via SUBCUTANEOUS
  Administered 2016-01-22: 3 [IU] via SUBCUTANEOUS
  Filled 2016-01-17: qty 5
  Filled 2016-01-17 (×3): qty 3
  Filled 2016-01-17: qty 5
  Filled 2016-01-17: qty 3
  Filled 2016-01-17: qty 5
  Filled 2016-01-17: qty 8
  Filled 2016-01-17: qty 3
  Filled 2016-01-17: qty 5
  Filled 2016-01-17: qty 3
  Filled 2016-01-17: qty 5
  Filled 2016-01-17: qty 8
  Filled 2016-01-17: qty 5
  Filled 2016-01-17: qty 8
  Filled 2016-01-17 (×3): qty 5
  Filled 2016-01-17: qty 3
  Filled 2016-01-17 (×2): qty 8
  Filled 2016-01-17: qty 5
  Filled 2016-01-17: qty 2
  Filled 2016-01-17 (×2): qty 5
  Filled 2016-01-17: qty 8
  Filled 2016-01-17 (×3): qty 5

## 2016-01-17 NOTE — Progress Notes (Signed)
Radiologist notified RN of the results of pt.'s CT with contrast. On call surgeon was notified, who is currently in surgery at this time, Nurse who answered the phone stated she "will relay the message to surgeon." Will continue to monitor pt.  [name] [place]

## 2016-01-17 NOTE — Progress Notes (Signed)
PHARMACY - ADULT TOTAL PARENTERAL NUTRITION CONSULT NOTE   Pharmacy Consult for TPN Indication: postop ileus  Patient Measurements: Height: 5\' 4"  (162.6 cm) Weight: 161 lb 1.6 oz (73.1 kg) IBW/kg (Calculated) : 54.7 TPN AdjBW (KG): 63.7 Body mass index is 27.65 kg/m.   Assessment: 82 yo female starting on TPN for postop ileus. Currently NPO  Endo: Pt currently on SSI (sensitive scale) q4h and Lantus 5 units qhs BG 81-191 Insulin requirements in the past 24 hours: 7 units SSI + 5 units Lantus Lytes: K 3.6, Phos 2.0, Mag 1.6   TPN Access: PICC TPN start date: 12/6  Nutritional Goals See dietician note 12/6  Current Nutrition: NPO  Plan:  Will order mag sulfate 2g IV x1 and KPhos 20 mmol IV x1 - anticipate rate increase in TPN to goal rate of 3770ml/hr today. Will recheck K and Phos at 1800 and supplement phos further if low. Labs ordered for AM, daily wts, strict I/O  Per dietician, plan to increase TPN Clinimix E 5/15 to goal rate of 70 ml/hr today and to start fat emulsion 20% (12 h infusion at rate of 20 ml/hr).   Discussed fluid rate with Dr. Everlene FarrierPabon - MD would like total fluids to be ~100 ml/hr. Will decrease  fluid (1/2NS with 20 K) rate from 70 ml/hr to 30 ml/hr when TPN drip increases in rate tonight at 1800.   Increase Clinimix E 5/15 to 70 ml/hr per dietician recommendations with 10 ml MVI and 1 ml trace elements in TPN per day. Hospitalist has increased Lantus to 10 units qhs for tonight. With increase in TPN drip rate, will increase SSI to moderate scale (0-15) q4h (discussed with hospitalist).    Pharmacy will continue to follow.   Crist FatWang, Breeonna Mone L 01/17/2016,7:10 AM

## 2016-01-17 NOTE — Progress Notes (Signed)
Dr. Cooper notiExcell Seltzerfied of Expiratory wheezes not present at beginning of shift; I/O: 685/lg.incontinent urine + 150 NGT output; Acknowledged, new order written. Windy Carinaurner,Shondell Fabel K, RN 4:50 AM 01/17/2016

## 2016-01-17 NOTE — Progress Notes (Signed)
PHARMACY - ADULT TOTAL PARENTERAL NUTRITION CONSULT NOTE   Pharmacy Consult for TPN Indication: postop ileus  Patient Measurements: Height: 5\' 4"  (162.6 cm) Weight: 161 lb 1.6 oz (73.1 kg) IBW/kg (Calculated) : 54.7 TPN AdjBW (KG): 63.7 Body mass index is 27.65 kg/m.   Assessment: 82 yo female starting on TPN for postop ileus. Currently NPO  Endo: Pt currently on SSI (sensitive scale) q4h and Lantus 5 units qhs BG 81-191 Insulin requirements in the past 24 hours: 7 units SSI + 5 units Lantus Lytes: K 3.6, Phos 2.0, Mag 1.6   TPN Access: PICC TPN start date: 12/6  Nutritional Goals See dietician note 12/6  Current Nutrition: NPO  Plan:  Will order mag sulfate 2g IV x1 and KPhos 20 mmol IV x1 - anticipate rate increase in TPN to goal rate of 5670ml/hr today. Will recheck K and Phos at 1800 and supplement phos further if low. Labs ordered for AM, daily wts, strict I/O  Per dietician, plan to increase TPN Clinimix E 5/15 to goal rate of 70 ml/hr today and to start fat emulsion 20% (12 h infusion at rate of 20 ml/hr for total of 250 mL).   Discussed fluid rate with Dr. Everlene FarrierPabon - MD would like total fluids to be ~100 ml/hr. Will decrease  fluid (1/2NS with 20 K) rate from 70 ml/hr to 30 ml/hr when TPN drip increases in rate tonight at 1800.   Increase Clinimix E 5/15 to 70 ml/hr per dietician recommendations with 10 ml MVI and 1 ml trace elements in TPN per day. Hospitalist has increased Lantus to 10 units qhs for tonight. With increase in TPN drip rate, will increase SSI to moderate scale (0-15) q4h (discussed with hospitalist).    Pharmacy will continue to follow.   Crist FatWang, Myranda Pavone L 01/17/2016,4:11 PM

## 2016-01-17 NOTE — Progress Notes (Signed)
Nutrition Follow-up  DOCUMENTATION CODES:   Non-severe (moderate) malnutrition in context of acute illness/injury  INTERVENTION:  -TPN: recommend increasing to goal rate of 5%AA/15%Dextrose at rate of 70 ml/hr. TG wdl, will begin 20% lipid infusion this evening as well (infuse over 12 hours at rate of 20 ml/hr).  Discussed blood glucose management plan with Pharmacist and Diabetes Inpatient Glycemic control team as pt with DKA on admission; also discussed phosphorus supplementation with Pharmacy. Pt on 1/2 NS with KCl at 70 ml/hr, Pharmacy plans to discuss this with MD as well  NUTRITION DIAGNOSIS:   Malnutrition related to acute illness as evidenced by mild depletion of muscle mass, percent weight loss, energy intake < 75% for > 7 days.  Being addressed via TPN  GOAL:   Patient will meet greater than or equal to 90% of their needs  MONITOR:   Diet advancement, Labs, Weight trends  REASON FOR ASSESSMENT:   Malnutrition Screening Tool    ASSESSMENT:    82 yo female admitted with SBO due to strangulated ventral hernia s/p lysis of adhesions with SB resection with anastamosis. Pt also in DKA and ARF on admission. Pt with hx of DM, HTN, HL, diverticulitis  Pt afebrile,  5%/15%Dextrose at rate of 35 ml/hr via PICC Line NG continues to suction, no flatus, no BM.  Pt making UOP but output not measured No edema documented, noted weight is trending up per weight encounters  Labs: phosphorus 2.0 (supplemented), magnesium 1.6 (supplemented), FSBS 130-190s Meds: ss novolog, lantus. 1/2 NS with KCl at 70 ml/hr  Diet Order:  Diet NPO time specified .TPN (CLINIMIX-E) Adult  Skin:  Reviewed, no issues  Last BM:  11/30  Height:   Ht Readings from Last 1 Encounters:  01/13/16 5\' 4"  (1.626 m)    Weight:   Wt Readings from Last 1 Encounters:  01/17/16 161 lb 1.6 oz (73.1 kg)    Filed Weights   01/13/16 1842 01/16/16 2100 01/17/16 0438  Weight: 140 lb 6.9 oz (63.7 kg) 154 lb  6.4 oz (70 kg) 161 lb 1.6 oz (73.1 kg)    BMI:  Body mass index is 27.65 kg/m.  Estimated Nutritional Needs:   Kcal:  1700-1900 kcals  Protein:  85-95 g  Fluid:  >/= 1.7 L  EDUCATION NEEDS:   No education needs identified at this time  Romelle StarcherCate Mithran Strike MS, RD, LDN [phone] Pager  [phone] Weekend/On-Call Pager

## 2016-01-17 NOTE — Progress Notes (Signed)
POD # 3  persistent tachy and WBC elevation No flatus VSS Creat ok  PE NAD Chest: CTA , no wheezes or rales Abd: incisions healing well, epigastric chronic incarcerated hernia, where midline incision is no infection, no peritonitis  A/P given tachy and Elev wbc will scan today r/o abscess Continue TPN and NGT for now

## 2016-01-17 NOTE — Progress Notes (Addendum)
[employer] - [place] at [facility]                                                                                                                                                                                  Patient Demographics   [name], is a 82 y.o. female, DOB - 1933/09/05, ZOX:[id number]  Admit date - 01/13/2016   Admitting Physician Henrene Dodge, MD  Outpatient Primary MD for the patient is Singh,Jasmine, MD   LOS - 4  Subjective: Patient still has significant NG tube drainage and denies any shortness of breath or chest pain    Review of Systems:   CONSTITUTIONAL: No documented fever. No fatigue, weakness. No weight gain, no weight loss.  EYES: No blurry or double vision.  ENT: No tinnitus. No postnasal drip. No redness of the oropharynx.  RESPIRATORY: No cough, no wheeze, no hemoptysis. No dyspnea.  CARDIOVASCULAR: No chest pain. No orthopnea. No palpitations. No syncope.  GASTROINTESTINAL: No nausea, no vomiting or diarrhea. Positive abdominal pain. No melena or hematochezia.  GENITOURINARY: No dysuria or hematuria.  ENDOCRINE: No polyuria or nocturia. No heat or cold intolerance.  HEMATOLOGY: No anemia. No bruising. No bleeding.  INTEGUMENTARY: No rashes. No lesions.  MUSCULOSKELETAL: No arthritis. No swelling. No gout.  NEUROLOGIC: No numbness, tingling, or ataxia. No seizure-type activity.  PSYCHIATRIC: No anxiety. No insomnia. No ADD.    Vitals:   Vitals:   01/16/16 2134 01/17/16 0435 01/17/16 0438 01/17/16 0818  BP: (!) 154/72 (!) 151/82  138/69  Pulse: (!) 115 (!) 116  (!) 110  Resp: 18 18  (!) 21  Temp: 97.9 F (36.6 C) 98.4 F (36.9 C)  98 F (36.7 C)  TempSrc: Oral Oral  Oral  SpO2: 97% 97%  97%  Weight:   161 lb 1.6 oz (73.1 kg)   Height:        Wt Readings from Last 3 Encounters:  01/17/16 161 lb 1.6 oz (73.1 kg)     Intake/Output Summary (Last 24 hours) at 01/17/16 1459 Last data filed at 01/17/16 1244  Gross per  24 hour  Intake          2373.04 ml  Output              420 ml  Net          1953.04 ml    Physical Exam:   GENERAL: Pleasant-appearing in no apparent distress.  HEAD, EYES, EARS, NOSE AND THROAT: Atraumatic, normocephalic. Extraocular muscles are intact. Pupils equal and reactive to light. Sclerae anicteric. No conjunctival injection. No oro-pharyngeal erythema.  NECK: Supple. There is no jugular venous distention. No bruits, no lymphadenopathy,  no thyromegaly.  HEART: Regular rate and rhythm,. No murmurs, no rubs, no clicks.  LUNGS: Clear to auscultation bilaterally. No rales or rhonchi. No wheezes.  ABDOMEN: Postop nontender EXTREMITIES: No evidence of any cyanosis, clubbing, or peripheral edema.  +2 pedal and radial pulses bilaterally.  NEUROLOGIC: The patient is alert, awake, and oriented x3 with no focal motor or sensory deficits appreciated bilaterally.  SKIN: Moist and warm with no rashes appreciated.  Psych: Not anxious, depressed LN: No inguinal LN enlargement    Antibiotics   Anti-infectives    Start     Dose/Rate Route Frequency Ordered Stop   01/15/16 1000  ertapenem (INVANZ) 0.5 g in sodium chloride 0.9 % 50 mL IVPB  Status:  Discontinued     500 mg 100 mL/hr over 30 Minutes Intravenous Every 24 hours 01/14/16 0542 01/14/16 1346   01/15/16 1000  ertapenem (INVANZ) 1 g in sodium chloride 0.9 % 50 mL IVPB     1 g 100 mL/hr over 30 Minutes Intravenous Every 24 hours 01/14/16 1346     01/15/16 0000  ertapenem (INVANZ) 1 g in sodium chloride 0.9 % 50 mL IVPB  Status:  Discontinued     1 g 100 mL/hr over 30 Minutes Intravenous Every 24 hours 01/14/16 0454 01/14/16 0541   01/14/16 0215  ertapenem (INVANZ) 1 g in sodium chloride 0.9 % 50 mL IVPB     1 g 100 mL/hr over 30 Minutes Intravenous  Once 01/14/16 0214 01/14/16 0330      Medications   Scheduled Meds: . albuterol      . chlorhexidine  15 mL Mouth Rinse BID  . ertapenem  1 g Intravenous Q24H  . insulin  aspart  0-9 Units Subcutaneous Q4H  . insulin glargine  10 Units Subcutaneous QHS  . mouth rinse  15 mL Mouth Rinse q12n4p  . pantoprazole (PROTONIX) IV  40 mg Intravenous Q24H   Continuous Infusions: . Marland Kitchen.TPN (CLINIMIX-E) Adult 35 mL/hr at 01/16/16 1835  . Marland Kitchen.TPN (CLINIMIX-E) Adult    . 0.45 % NaCl with KCl 20 mEq / L    . fat emulsion     PRN Meds:.albuterol, hydrALAZINE, HYDROmorphone (DILAUDID) injection, menthol-cetylpyridinium, metoprolol, ondansetron **OR** ondansetron (ZOFRAN) IV   Data Review:   Micro Results Recent Results (from the past 240 hour(s))  MRSA PCR Screening     Status: None   Collection Time: 01/13/16  6:39 PM  Result Value Ref Range Status   MRSA by PCR NEGATIVE NEGATIVE Final    Comment:        The GeneXpert MRSA Assay (FDA approved for NASAL specimens only), is one component of a comprehensive MRSA colonization surveillance program. It is not intended to diagnose MRSA infection nor to guide or monitor treatment for MRSA infections.     Radiology Reports Ct Abdomen Pelvis Wo Contrast  Result Date: 01/13/2016 CLINICAL DATA:  Vomiting. EXAM: CT ABDOMEN AND PELVIS WITHOUT CONTRAST TECHNIQUE: Multidetector CT imaging of the abdomen and pelvis was performed following the standard protocol without IV contrast. COMPARISON:  None. FINDINGS: Lower chest: No acute abnormality. Hepatobiliary: No focal liver abnormality is seen. No gallstones, gallbladder wall thickening, or biliary dilatation. Pancreas: Unremarkable. No pancreatic ductal dilatation or surrounding inflammatory changes. Spleen: Normal in size without focal abnormality. Adrenals/Urinary Tract: Adrenal glands are unremarkable. Kidneys are normal, without renal calculi, focal lesion, or hydronephrosis. Bladder is unremarkable. Stomach/Bowel: The stomach is mildly distended. There is a small bowel obstruction. The transition point is associated with the  ventral hernia just below the umbilicus. There is an  incarcerated loop of bowel in this region. Colonic diverticulosis is seen without diverticulitis. Visualized appendix is unremarkable. Vascular/Lymphatic: Atherosclerosis in the abdominal aorta and branching vessels. No adenopathy. Reproductive: 2 cm simple cyst in the right ovary. The adnexae and uterus are otherwise normal. Other: There is a fat containing umbilical hernia. There is also a ventral hernia inferior to the emboli kiss which contains an incarcerated loop of small bowel. Musculoskeletal: Loss of height at L5 is likely osteoporotic from age-indeterminate compression fracture. However, I suspect this is not acute. IMPRESSION: 1. Incarcerated loop of small bowel in a ventral hernia just inferior to the umbilicus resulting in a high-grade small bowel obstruction. 2. Mild atherosclerosis in the abdominal aorta. Electronically Signed   By: Gerome Samavid  Williams III M.D   On: 01/13/2016 15:44   Dg Abd 1 View  Result Date: 01/14/2016 CLINICAL DATA:  Nasogastric tube placement. EXAM: ABDOMEN - 1 VIEW COMPARISON:  CT abdomen and pelvis January 13, 2016 at 1534 hours FINDINGS: Nasogastric tube looped in proximal stomach, distal tip projecting at GE junction. Center loops of gas distended bowel corresponding to patient's known small bowel obstruction. No intra-abdominal mass effect or pathologic calcifications. Soft tissue planes and included osseous structures are unchanged. IMPRESSION: Nasogastric tube in proximal stomach. Electronically Signed   By: Awilda Metroourtnay  Bloomer M.D.   On: 01/14/2016 00:21   Ct Abdomen Pelvis W Contrast  Result Date: 01/17/2016 CLINICAL DATA:  Small bowel resection January 14, 2016 for incarcerated hernia. Abdominal distention. EXAM: CT ABDOMEN AND PELVIS WITH CONTRAST TECHNIQUE: Multidetector CT imaging of the abdomen and pelvis was performed using the standard protocol following bolus administration of intravenous contrast. CONTRAST:  100mL ISOVUE-300 IOPAMIDOL (ISOVUE-300) INJECTION  61% COMPARISON:  January 13, 2016 FINDINGS: Lower chest: New small bilateral pleural effusions with associated atelectasis. Coronary artery calcifications. The lung bases are otherwise unchanged and unremarkable. Hepatobiliary: No focal liver abnormality is seen. No gallstones, gallbladder wall thickening, or biliary dilatation. Pancreas: Unremarkable. No pancreatic ductal dilatation or surrounding inflammatory changes. Spleen: Normal in size without focal abnormality. Adrenals/Urinary Tract: The adrenal glands are normal. Nodular contour to the bilateral kidneys is unchanged with multiple small cysts but no evidence of obstruction. Stomach/Bowel: There is an NG tube terminating in the stomach. The stomach is normal in appearance. The small bowel is dilated to the level of surgical anastomosis in the left lower quadrant. There is a focus of air near the region of anastomosis on series 2, image 53. It is unclear whether this air is intraluminal or extraluminal. Even if it is extraluminal, I suspect it is postsurgical as there are no adjacent fluid collections or abscess to suggest leak. Distal to the anastomosis, the small bowel is decompressed. Scattered colonic diverticulosis is seen without diverticulitis. The colon is relatively decompressed compared to the small bowel. The appendix is not seen but there is no secondary evidence of appendicitis. Vascular/Lymphatic: Atherosclerotic changes seen in the non aneurysmal aorta, its branching vessels, the iliac vessels, and femoral vessels. No adenopathy. Reproductive: A vaginal pessary is identified. The uterus is unremarkable. The left ovary is normal in appearance. There is a 2 cm cyst in the right ovary which is unchanged. Other: The incarcerated hernia has been reduced. Free fluid is likely postoperative. A tiny amount of intraperitoneal air is also likely postoperative given surgery just a couple of days ago. Musculoskeletal: Loss of height of L5 is unchanged. No  bony changes. IMPRESSION: 1. There  is a small bowel obstruction. The transition point is at the anastomosis in the left lower quadrant from the recent surgery. An NG tube terminates in the stomach. 2. Reduction of previous incarcerated hernia. 3. A small amount of free fluid and a tiny amount of intraperitoneal air is likely postoperative. No evidence of abscess. 4. Atherosclerosis in the aorta and branching vessels. These results will be called to the ordering clinician or representative by the Radiologist Assistant, and communication documented in the PACS or zVision Dashboard. Electronically Signed   By: Gerome Sam III M.D   On: 01/17/2016 12:04   Dg Chest Port 1 View  Result Date: 01/16/2016 CLINICAL DATA:  Status post PICC line placement EXAM: PORTABLE CHEST 1 VIEW COMPARISON:  01/16/2016 FINDINGS: Right-sided PICC line is now seen with the catheter tip at the cavoatrial junction. A nasogastric catheter is again noted within the stomach. The lungs are clear. Cardiac shadow is stable. Degenerative change of the thoracic spine is noted. IMPRESSION: Status post PICC line in satisfactory position. Electronically Signed   By: Alcide Clever M.D.   On: 01/16/2016 12:38   Dg Abd Acute W/chest  Result Date: 01/16/2016 CLINICAL DATA:  Followup adynamic ileus. EXAM: DG ABDOMEN ACUTE W/ 1V CHEST COMPARISON:  01/13/2016 FINDINGS: There is decreased air within the small bowel a comparative recent prior exam. Small bowel does visible appears nondilated. No free air. Skin staples extend along the low anterior midline, new from the prior exam. A nasogastric tube has its tip projecting in the distal stomach. Soft tissues are otherwise unremarkable. There is linear atelectasis at the lung bases associated with low lung volumes. Lungs are otherwise clear. IMPRESSION: 1. No radiographic findings of a bowel obstruction or significant adynamic ileus. No free air. 2. Mild lung base atelectasis.  No acute cardiopulmonary  disease. Electronically Signed   By: Amie Portland M.D.   On: 01/16/2016 08:39     CBC  Recent Labs Lab 01/13/16 1426 01/14/16 0625 01/16/16 0443 01/17/16 0500  WBC 12.1* 9.3 15.6* 16.5*  HGB 14.9 12.0 11.7* 11.1*  HCT 44.6 36.2 34.7* 33.6*  PLT 373 273 306 300  MCV 92.4 92.4 92.4 93.4  MCH 30.8 30.5 31.2 30.7  MCHC 33.4 33.0 33.8 32.9  RDW 14.0 13.7 13.9 14.2    Chemistries   Recent Labs Lab 01/13/16 1426  01/13/16 2341 01/14/16 0625 01/14/16 1401 01/15/16 0532 01/16/16 0443 01/17/16 0500  NA 131*  < > 135 137  --  138 137 137  K 3.5  < > 3.9 3.3* 3.8 3.9 3.7 3.6  CL 83*  < > 95* 105  --  106 104 102  CO2 30  < > 27 26  --  25 21* 27  GLUCOSE 516*  < > 195* 209*  --  119* 86 208*  BUN 38*  < > 38* 28*  --  18 10 9   CREATININE 1.87*  < > 1.52* 1.02*  --  0.65 0.58 0.44  CALCIUM 10.1  < > 8.6* 7.6*  --  8.0* 8.1* 8.1*  MG  --   --   --  1.3* 1.7 1.7 1.5* 1.6*  AST 19  --   --   --   --   --   --  15  ALT 16  --   --   --   --   --   --  9*  ALKPHOS 98  --   --   --   --   --   --  66  BILITOT 0.7  --   --   --   --   --   --  1.4*  < > = values in this interval not displayed. ------------------------------------------------------------------------------------------------------------------ estimated creatinine clearance is 55.9 mL/min (by C-G formula based on SCr of 0.44 mg/dL). ------------------------------------------------------------------------------------------------------------------ No results for input(s): HGBA1C in the last 72 hours. ------------------------------------------------------------------------------------------------------------------  Recent Labs  01/17/16 0500  TRIG 126   ------------------------------------------------------------------------------------------------------------------ No results for input(s): TSH, T4TOTAL, T3FREE, THYROIDAB in the last 72 hours.  Invalid input(s):  FREET3 ------------------------------------------------------------------------------------------------------------------ No results for input(s): VITAMINB12, FOLATE, FERRITIN, TIBC, IRON, RETICCTPCT in the last 72 hours.  Coagulation profile No results for input(s): INR, PROTIME in the last 168 hours.  No results for input(s): DDIMER in the last 72 hours.  Cardiac Enzymes No results for input(s): CKMB, TROPONINI, MYOGLOBIN in the last 168 hours.  Invalid input(s): CK ------------------------------------------------------------------------------------------------------------------ Invalid input(s): POCBNP    Assessment & Plan   #Diabetic2 We'll increase the dose of Lantus due to patient now on TPN blood sugars increasing   #Essential hypertension- pressure currently stable Blood pressure is stable at this time and patient is nothing by mouth hold home medications IV Lopressor as needed  #Acute kidney injury secondary to dehydration from nausea and vomiting Resolved  #Small bowel obstruction from incarcerated ventral hernia Status post surgery Continue current management  #Hyperlipidemia currently patient is nothing by mouth Home medications on hold  #Hypokalemia and hypomagnesemia replaced  GI prophylaxis with Protonix DVT prophylaxis with SCDs      Code Status Orders        Start     Ordered   01/13/16 1705  Full code  Continuous     01/13/16 1710    Code Status History    Date Active Date Inactive Code Status Order ID Comments User Context   This patient has a current code status but no historical code status.              DVT Prophylaxis  Lovenox   Lab Results  Component Value Date   PLT 300 01/17/2016     Time Spent in minutes   Greater than 50% of time spent in care coordination and counseling patient regarding the condition and plan of care.   Auburn Bilberry M.D on 01/17/2016 at 2:59 PM  Between 7am to 6pm - Pager -  [phone]  After 6pm go to [email or web address] - password [email or web address] [facility]  [facility]   Office  [phone]

## 2016-01-17 NOTE — Progress Notes (Signed)
Inpatient Diabetes Program Recommendations  AACE/ADA: New Consensus Statement on Inpatient Glycemic Control (2015)  Target Ranges:  Prepandial:   less than 140 mg/dL      Peak postprandial:   less than 180 mg/dL (1-2 hours)      Critically ill patients:  140 - 180 mg/dL   Lab Results  Component Value Date   GLUCAP 191 (H) 01/17/2016   HGBA1C 8.5 (H) 01/13/2016    Review of Glycemic Control  Diabetes history: Type 2 Outpatient Diabetes medications: Amaryl 4mg  q day Current orders for Inpatient glycemic control: Lantus 10 units qhs, Novolog 0-9 units q4h  Inpatient Diabetes Program Recommendations: Unable to visit with patient today- both times I went to see her she was not in the room.    Noted that Lantus was increased today to Lantus 10 units beginning tonight.  Received a page from inpatient dietitian regarding concern of increasing CBG as TPN rate increases. Spoke with pharmacy who will continue to follow this patient along with us.  Once TPN is at a stable rate, they will make recommendations for insulin in the TPN- dietitian made aware. Until then, we will continue to follow and make recommendations as needed.  Susette RacerJulie Jazzlynn Rawe, RN, BA, MHA, CDE Diabetes Coordinator Inpatient Diabetes Program  [phone] (Team Pager) [phone] [facility] Office) 01/17/2016 12:03 PM

## 2016-01-17 NOTE — Progress Notes (Signed)
PHARMACY - ADULT TOTAL PARENTERAL NUTRITION CONSULT NOTE   Pharmacy Consult for TPN Indication: postop ileus  Patient Measurements: Height: 5\' 4"  (162.6 cm) Weight: 161 lb 1.6 oz (73.1 kg) IBW/kg (Calculated) : 54.7 TPN AdjBW (KG): 63.7 Body mass index is 27.65 kg/m.   Assessment: 82 yo female starting on TPN for postop ileus. Currently NPO  Endo: Pt currently on SSI (sensitive scale) q4h and Lantus 5 units qhs BG 81-191 Insulin requirements in the past 24 hours: 7 units SSI + 5 units Lantus Lytes: K 3.6, Phos 2.0, Mag 1.6   TPN Access: PICC TPN start date: 12/6  Nutritional Goals See dietician note 12/6  Current Nutrition: NPO  Plan:  Will order mag sulfate 2g IV x1 and KPhos 20 mmol IV x1 - anticipate rate increase in TPN to goal rate of 5670ml/hr today. Will recheck K and Phos at 1800 and supplement phos further if low. Labs ordered for AM, daily wts, strict I/O  Per dietician, plan to increase TPN Clinimix E 5/15 to goal rate of 70 ml/hr today and to start fat emulsion 20% (12 h infusion at rate of 20 ml/hr for total of 250 mL).   Discussed fluid rate with Dr. Everlene FarrierPabon - MD would like total fluids to be ~100 ml/hr. Will decrease  fluid (1/2NS with 20 K) rate from 70 ml/hr to 30 ml/hr when TPN drip increases in rate tonight at 1800.   Increase Clinimix E 5/15 to 70 ml/hr per dietician recommendations with 10 ml MVI and 1 ml trace elements in TPN per day. Hospitalist has increased Lantus to 10 units qhs for tonight. With increase in TPN drip rate, will increase SSI to moderate scale (0-15) q4h (discussed with hospitalist).    12/7 1823 Phos: 2.6   K: 3.8.- No additional supplementation needed at this time. Labs ordered for AM. Pharmacy will continue to follow.    Gardner CandleSheema M Itzayana Pardy, PharmD, BCPS Clinical Pharmacist 01/17/2016 7:54 PM

## 2016-01-18 LAB — BASIC METABOLIC PANEL
Anion gap: 5 (ref 5–15)
BUN: 11 mg/dL (ref 6–20)
CALCIUM: 8.1 mg/dL — AB (ref 8.9–10.3)
CHLORIDE: 97 mmol/L — AB (ref 101–111)
CO2: 30 mmol/L (ref 22–32)
CREATININE: 0.41 mg/dL — AB (ref 0.44–1.00)
GFR calc Af Amer: >60 mL/min (ref >60)
GFR calc non Af Amer: >60 mL/min (ref >60)
Glucose, Bld: 263 mg/dL — ABNORMAL HIGH (ref 65–99)
Potassium: 3.8 mmol/L (ref 3.5–5.1)
SODIUM: 132 mmol/L — AB (ref 135–145)

## 2016-01-18 LAB — CBC
HCT: 30.4 % — ABNORMAL LOW (ref 35.0–47.0)
Hemoglobin: 10.2 g/dL — ABNORMAL LOW (ref 12.0–16.0)
MCH: 31.2 pg (ref 26.0–34.0)
MCHC: 33.7 g/dL (ref 32.0–36.0)
MCV: 92.7 fL (ref 80.0–100.0)
PLATELETS: 298 10*3/uL (ref 150–440)
RBC: 3.28 MIL/uL — ABNORMAL LOW (ref 3.80–5.20)
RDW: 14.2 % (ref 11.5–14.5)
WBC: 12.6 10*3/uL — AB (ref 3.6–11.0)

## 2016-01-18 LAB — GLUCOSE, CAPILLARY
GLUCOSE-CAPILLARY: 220 mg/dL — AB (ref 65–99)
GLUCOSE-CAPILLARY: 231 mg/dL — AB (ref 65–99)
GLUCOSE-CAPILLARY: 257 mg/dL — AB (ref 65–99)
GLUCOSE-CAPILLARY: 267 mg/dL — AB (ref 65–99)
Glucose-Capillary: 257 mg/dL — ABNORMAL HIGH (ref 65–99)
Glucose-Capillary: 290 mg/dL — ABNORMAL HIGH (ref 65–99)
Glucose-Capillary: 184 mg/dL — ABNORMAL HIGH (ref 65–99)

## 2016-01-18 LAB — MAGNESIUM: MAGNESIUM: 1.6 mg/dL — AB (ref 1.7–2.4)

## 2016-01-18 LAB — PHOSPHORUS: Phosphorus: 2.7 mg/dL (ref 2.5–4.6)

## 2016-01-18 MED ORDER — MAGNESIUM SULFATE 2 GM/50ML IV SOLN
2.0000 g | Freq: Once | INTRAVENOUS | Status: AC
  Administered 2016-01-18: 2 g via INTRAVENOUS
  Filled 2016-01-18: qty 50

## 2016-01-18 MED ORDER — TRACE MINERALS CR-CU-MN-SE-ZN 10-1000-500-60 MCG/ML IV SOLN
INTRAVENOUS | Status: AC
  Administered 2016-01-18: 17:00:00 via INTRAVENOUS
  Filled 2016-01-18: qty 1680

## 2016-01-18 MED ORDER — INSULIN GLARGINE 100 UNIT/ML ~~LOC~~ SOLN
15.0000 [IU] | Freq: Every day | SUBCUTANEOUS | Status: DC
Start: 2016-01-18 — End: 2016-01-19
  Administered 2016-01-18: 15 [IU] via SUBCUTANEOUS
  Filled 2016-01-18 (×2): qty 0.15

## 2016-01-18 MED ORDER — FAT EMULSION 20 % IV EMUL
250.0000 mL | INTRAVENOUS | Status: AC
  Administered 2016-01-18: 250 mL via INTRAVENOUS
  Filled 2016-01-18: qty 250

## 2016-01-18 NOTE — Progress Notes (Signed)
CC: POD # 4 Subjective: Feeling better CT reviewed, ileus vs partial sbo, no abscess or other complications No flatus Abd pain improving WBC better , tachy better   Objective: Vital signs in last 24 hours: Temp:  [98.1 F (36.7 C)-99.3 F (37.4 C)] 99.3 F (37.4 C) (12/08 0814) Pulse Rate:  [102-110] 107 (12/08 0814) Resp:  [18-21] 21 (12/08 0814) BP: (123-146)/(62-74) 135/62 (12/08 0814) SpO2:  [95 %-98 %] 95 % (12/08 0814) Weight:  [74.8 kg (165 lb)] 74.8 kg (165 lb) (12/08 0518) Last BM Date:  (pt stated prior to admission)  Intake/Output from previous day: 12/07 0701 - 12/08 0700 In: 2598 [I.V.:2388; IV Piggyback:210] Out: 750 [Emesis/NG output:750] Intake/Output this shift: No intake/output data recorded.  Physical exam: NAD , debilitated Abd: staples in place, incision healing well, no infection, no peritonitis   Lab Results: CBC   Recent Labs  01/17/16 0500 01/18/16 0429  WBC 16.5* 12.6*  HGB 11.1* 10.2*  HCT 33.6* 30.4*  PLT 300 298   BMET  Recent Labs  01/17/16 0500 01/17/16 1823 01/18/16 0429  NA 137  --  132*  K 3.6 3.8 3.8  CL 102  --  97*  CO2 27  --  30  GLUCOSE 208*  --  263*  BUN 9  --  11  CREATININE 0.44  --  0.41*  CALCIUM 8.1*  --  8.1*   PT/INR No results for input(s): LABPROT, INR in the last 72 hours. ABG No results for input(s): PHART, HCO3 in the last 72 hours.  Invalid input(s): PCO2, PO2  Studies/Results: Ct Abdomen Pelvis W Contrast  Result Date: 01/17/2016 CLINICAL DATA:  Small bowel resection January 14, 2016 for incarcerated hernia. Abdominal distention. EXAM: CT ABDOMEN AND PELVIS WITH CONTRAST TECHNIQUE: Multidetector CT imaging of the abdomen and pelvis was performed using the standard protocol following bolus administration of intravenous contrast. CONTRAST:  100mL ISOVUE-300 IOPAMIDOL (ISOVUE-300) INJECTION 61% COMPARISON:  January 13, 2016 FINDINGS: Lower chest: New small bilateral pleural effusions with  associated atelectasis. Coronary artery calcifications. The lung bases are otherwise unchanged and unremarkable. Hepatobiliary: No focal liver abnormality is seen. No gallstones, gallbladder wall thickening, or biliary dilatation. Pancreas: Unremarkable. No pancreatic ductal dilatation or surrounding inflammatory changes. Spleen: Normal in size without focal abnormality. Adrenals/Urinary Tract: The adrenal glands are normal. Nodular contour to the bilateral kidneys is unchanged with multiple small cysts but no evidence of obstruction. Stomach/Bowel: There is an NG tube terminating in the stomach. The stomach is normal in appearance. The small bowel is dilated to the level of surgical anastomosis in the left lower quadrant. There is a focus of air near the region of anastomosis on series 2, image 53. It is unclear whether this air is intraluminal or extraluminal. Even if it is extraluminal, I suspect it is postsurgical as there are no adjacent fluid collections or abscess to suggest leak. Distal to the anastomosis, the small bowel is decompressed. Scattered colonic diverticulosis is seen without diverticulitis. The colon is relatively decompressed compared to the small bowel. The appendix is not seen but there is no secondary evidence of appendicitis. Vascular/Lymphatic: Atherosclerotic changes seen in the non aneurysmal aorta, its branching vessels, the iliac vessels, and femoral vessels. No adenopathy. Reproductive: A vaginal pessary is identified. The uterus is unremarkable. The left ovary is normal in appearance. There is a 2 cm cyst in the right ovary which is unchanged. Other: The incarcerated hernia has been reduced. Free fluid is likely postoperative. A tiny amount  of intraperitoneal air is also likely postoperative given surgery just a couple of days ago. Musculoskeletal: Loss of height of L5 is unchanged. No bony changes. IMPRESSION: 1. There is a small bowel obstruction. The transition point is at the  anastomosis in the left lower quadrant from the recent surgery. An NG tube terminates in the stomach. 2. Reduction of previous incarcerated hernia. 3. A small amount of free fluid and a tiny amount of intraperitoneal air is likely postoperative. No evidence of abscess. 4. Atherosclerosis in the aorta and branching vessels. These results will be called to the ordering clinician or representative by the Radiologist Assistant, and communication documented in the PACS or zVision Dashboard. Electronically Signed   By: Gerome Samavid  Williams III M.D   On: 01/17/2016 12:04   Dg Chest Port 1 View  Result Date: 01/16/2016 CLINICAL DATA:  Status post PICC line placement EXAM: PORTABLE CHEST 1 VIEW COMPARISON:  01/16/2016 FINDINGS: Right-sided PICC line is now seen with the catheter tip at the cavoatrial junction. A nasogastric catheter is again noted within the stomach. The lungs are clear. Cardiac shadow is stable. Degenerative change of the thoracic spine is noted. IMPRESSION: Status post PICC line in satisfactory position. Electronically Signed   By: Alcide CleverMark  Lukens M.D.   On: 01/16/2016 12:38    Anti-infectives: Anti-infectives    Start     Dose/Rate Route Frequency Ordered Stop   01/15/16 1000  ertapenem (INVANZ) 0.5 g in sodium chloride 0.9 % 50 mL IVPB  Status:  Discontinued     500 mg 100 mL/hr over 30 Minutes Intravenous Every 24 hours 01/14/16 0542 01/14/16 1346   01/15/16 1000  ertapenem (INVANZ) 1 g in sodium chloride 0.9 % 50 mL IVPB     1 g 100 mL/hr over 30 Minutes Intravenous Every 24 hours 01/14/16 1346     01/15/16 0000  ertapenem (INVANZ) 1 g in sodium chloride 0.9 % 50 mL IVPB  Status:  Discontinued     1 g 100 mL/hr over 30 Minutes Intravenous Every 24 hours 01/14/16 0454 01/14/16 0541   01/14/16 0215  ertapenem (INVANZ) 1 g in sodium chloride 0.9 % 50 mL IVPB     1 g 100 mL/hr over 30 Minutes Intravenous  Once 01/14/16 0214 01/14/16 0330      Assessment/Plan: Ileus, continue NGT and  TPN No need for surgical intervention PT pulm toilet A/bs   Sterling Bigiego Juhi Lagrange, MD, FACS  01/18/2016

## 2016-01-18 NOTE — Progress Notes (Signed)
[facility] - [place] [facility]                                                                                                                                                                                  Patient Demographics   [name], is a 82 y.o. female, DOB - 1933/10/25, ZOX:[id number]  Admit date - 01/13/2016   Admitting Physician Henrene Dodge, MD  Outpatient Primary MD for the patient is [name],Jasmine, MD   LOS - 5  Subjective: Still has significant NG tube drainage. Reports mild improvement in abdominal pain.   Review of Systems:   CONSTITUTIONAL: No documented fever. No fatigue, weakness. No weight gain, no weight loss.  EYES: No blurry or double vision.  ENT: No tinnitus. No postnasal drip. No redness of the oropharynx.  RESPIRATORY: No cough, no wheeze, no hemoptysis. No dyspnea.  CARDIOVASCULAR: No chest pain. No orthopnea. No palpitations. No syncope.  GASTROINTESTINAL: No nausea, no vomiting or diarrhea. Positive abdominal pain. No melena or hematochezia.  GENITOURINARY: No dysuria or hematuria.  ENDOCRINE: No polyuria or nocturia. No heat or cold intolerance.  HEMATOLOGY: No anemia. No bruising. No bleeding.  INTEGUMENTARY: No rashes. No lesions.  MUSCULOSKELETAL: No arthritis. No swelling. No gout.  NEUROLOGIC: No numbness, tingling, or ataxia. No seizure-type activity.  PSYCHIATRIC: No anxiety. No insomnia. No ADD.    Vitals:   Vitals:   01/17/16 2102 01/18/16 0518 01/18/16 0814 01/18/16 1049  BP: (!) 146/69 123/74 135/62   Pulse: (!) 110 (!) 109 (!) 107 (!) 116  Resp: 20 18 (!) 21   Temp: 98.9 F (37.2 C) 98.6 F (37 C) 99.3 F (37.4 C)   TempSrc: Oral Oral Oral   SpO2: 96% 98% 95% 94%  Weight:  165 lb (74.8 kg)    Height:        Wt Readings from Last 3 Encounters:  01/18/16 165 lb (74.8 kg)     Intake/Output Summary (Last 24 hours) at 01/18/16 1337 Last data filed at 01/18/16 1212  Gross per 24 hour  Intake              2310 ml  Output              530 ml  Net             1780 ml    Physical Exam:   GENERAL: Pleasant-appearing in no apparent distress.  HEAD, EYES, EARS, NOSE AND THROAT: Atraumatic, normocephalic. Extraocular muscles are intact. Pupils equal and reactive to light. Sclerae anicteric. No conjunctival injection. No oro-pharyngeal erythema.  NECK: Supple. There is no jugular venous distention. No bruits, no lymphadenopathy, no thyromegaly.  HEART: Regular rate and rhythm,. No murmurs, no rubs, no clicks.  LUNGS: Clear to auscultation bilaterally. No rales or rhonchi. No wheezes.  ABDOMEN: Postop positive nontender, no bowel sounds EXTREMITIES: No evidence of any cyanosis, clubbing, or peripheral edema.  +2 pedal and radial pulses bilaterally.  NEUROLOGIC: The patient is alert, awake, and oriented x3 with no focal motor or sensory deficits appreciated bilaterally.  SKIN: Moist and warm with no rashes appreciated.  Psych: Not anxious, depressed LN: No inguinal LN enlargement    Antibiotics   Anti-infectives    Start     Dose/Rate Route Frequency Ordered Stop   01/15/16 1000  ertapenem (INVANZ) 0.5 g in sodium chloride 0.9 % 50 mL IVPB  Status:  Discontinued     500 mg 100 mL/hr over 30 Minutes Intravenous Every 24 hours 01/14/16 0542 01/14/16 1346   01/15/16 1000  ertapenem (INVANZ) 1 g in sodium chloride 0.9 % 50 mL IVPB     1 g 100 mL/hr over 30 Minutes Intravenous Every 24 hours 01/14/16 1346     01/15/16 0000  ertapenem (INVANZ) 1 g in sodium chloride 0.9 % 50 mL IVPB  Status:  Discontinued     1 g 100 mL/hr over 30 Minutes Intravenous Every 24 hours 01/14/16 0454 01/14/16 0541   01/14/16 0215  ertapenem (INVANZ) 1 g in sodium chloride 0.9 % 50 mL IVPB     1 g 100 mL/hr over 30 Minutes Intravenous  Once 01/14/16 0214 01/14/16 0330      Medications   Scheduled Meds: . chlorhexidine  15 mL Mouth Rinse BID  . ertapenem  1 g Intravenous Q24H  . insulin aspart  0-15 Units  Subcutaneous Q4H  . insulin glargine  15 Units Subcutaneous QHS  . mouth rinse  15 mL Mouth Rinse q12n4p  . pantoprazole (PROTONIX) IV  40 mg Intravenous Q24H   Continuous Infusions: . Marland Kitchen.TPN (CLINIMIX-E) Adult 70 mL/hr at 01/18/16 1212  . 0.45 % NaCl with KCl 20 mEq / L 30 mL/hr at 01/18/16 1212  . fat emulsion 250 mL (01/17/16 1848)   PRN Meds:.albuterol, hydrALAZINE, HYDROmorphone (DILAUDID) injection, menthol-cetylpyridinium, metoprolol, ondansetron **OR** ondansetron (ZOFRAN) IV   Data Review:   Micro Results Recent Results (from the past 240 hour(s))  MRSA PCR Screening     Status: None   Collection Time: 01/13/16  6:39 PM  Result Value Ref Range Status   MRSA by PCR NEGATIVE NEGATIVE Final    Comment:        The GeneXpert MRSA Assay (FDA approved for NASAL specimens only), is one component of a comprehensive MRSA colonization surveillance program. It is not intended to diagnose MRSA infection nor to guide or monitor treatment for MRSA infections.     Radiology Reports Ct Abdomen Pelvis Wo Contrast  Result Date: 01/13/2016 CLINICAL DATA:  Vomiting. EXAM: CT ABDOMEN AND PELVIS WITHOUT CONTRAST TECHNIQUE: Multidetector CT imaging of the abdomen and pelvis was performed following the standard protocol without IV contrast. COMPARISON:  None. FINDINGS: Lower chest: No acute abnormality. Hepatobiliary: No focal liver abnormality is seen. No gallstones, gallbladder wall thickening, or biliary dilatation. Pancreas: Unremarkable. No pancreatic ductal dilatation or surrounding inflammatory changes. Spleen: Normal in size without focal abnormality. Adrenals/Urinary Tract: Adrenal glands are unremarkable. Kidneys are normal, without renal calculi, focal lesion, or hydronephrosis. Bladder is unremarkable. Stomach/Bowel: The stomach is mildly distended. There is a small bowel obstruction. The transition point is associated with the ventral hernia just below the umbilicus. There is  an  incarcerated loop of bowel in this region. Colonic diverticulosis is seen without diverticulitis. Visualized appendix is unremarkable. Vascular/Lymphatic: Atherosclerosis in the abdominal aorta and branching vessels. No adenopathy. Reproductive: 2 cm simple cyst in the right ovary. The adnexae and uterus are otherwise normal. Other: There is a fat containing umbilical hernia. There is also a ventral hernia inferior to the emboli kiss which contains an incarcerated loop of small bowel. Musculoskeletal: Loss of height at L5 is likely osteoporotic from age-indeterminate compression fracture. However, I suspect this is not acute. IMPRESSION: 1. Incarcerated loop of small bowel in a ventral hernia just inferior to the umbilicus resulting in a high-grade small bowel obstruction. 2. Mild atherosclerosis in the abdominal aorta. Electronically Signed   By: Gerome Sam III M.D   On: 01/13/2016 15:44   Dg Abd 1 View  Result Date: 01/14/2016 CLINICAL DATA:  Nasogastric tube placement. EXAM: ABDOMEN - 1 VIEW COMPARISON:  CT abdomen and pelvis January 13, 2016 at 1534 hours FINDINGS: Nasogastric tube looped in proximal stomach, distal tip projecting at GE junction. Center loops of gas distended bowel corresponding to patient's known small bowel obstruction. No intra-abdominal mass effect or pathologic calcifications. Soft tissue planes and included osseous structures are unchanged. IMPRESSION: Nasogastric tube in proximal stomach. Electronically Signed   By: Awilda Metro M.D.   On: 01/14/2016 00:21   Ct Abdomen Pelvis W Contrast  Result Date: 01/17/2016 CLINICAL DATA:  Small bowel resection January 14, 2016 for incarcerated hernia. Abdominal distention. EXAM: CT ABDOMEN AND PELVIS WITH CONTRAST TECHNIQUE: Multidetector CT imaging of the abdomen and pelvis was performed using the standard protocol following bolus administration of intravenous contrast. CONTRAST:  ISOVUE-300 IOPAMIDOL (ISOVUE-300) INJECTION  61% COMPARISON:  January 13, 2016 FINDINGS: Lower chest: New small bilateral pleural effusions with associated atelectasis. Coronary artery calcifications. The lung bases are otherwise unchanged and unremarkable. Hepatobiliary: No focal liver abnormality is seen. No gallstones, gallbladder wall thickening, or biliary dilatation. Pancreas: Unremarkable. No pancreatic ductal dilatation or surrounding inflammatory changes. Spleen: Normal in size without focal abnormality. Adrenals/Urinary Tract: The adrenal glands are normal. Nodular contour to the bilateral kidneys is unchanged with multiple small cysts but no evidence of obstruction. Stomach/Bowel: There is an NG tube terminating in the stomach. The stomach is normal in appearance. The small bowel is dilated to the level of surgical anastomosis in the left lower quadrant. There is a focus of air near the region of anastomosis on series 2, image 53. It is unclear whether this air is intraluminal or extraluminal. Even if it is extraluminal, I suspect it is postsurgical as there are no adjacent fluid collections or abscess to suggest leak. Distal to the anastomosis, the small bowel is decompressed. Scattered colonic diverticulosis is seen without diverticulitis. The colon is relatively decompressed compared to the small bowel. The appendix is not seen but there is no secondary evidence of appendicitis. Vascular/Lymphatic: Atherosclerotic changes seen in the non aneurysmal aorta, its branching vessels, the iliac vessels, and femoral vessels. No adenopathy. Reproductive: A vaginal pessary is identified. The uterus is unremarkable. The left ovary is normal in appearance. There is a 2 cm cyst in the right ovary which is unchanged. Other: The incarcerated hernia has been reduced. Free fluid is likely postoperative. A tiny amount of intraperitoneal air is also likely postoperative given surgery just a couple of days ago. Musculoskeletal: Loss of height of L5 is unchanged. No  bony changes. IMPRESSION: 1. There is a small bowel obstruction. The transition point  is at the anastomosis in the left lower quadrant from the recent surgery. An NG tube terminates in the stomach. 2. Reduction of previous incarcerated hernia. 3. A small amount of free fluid and a tiny amount of intraperitoneal air is likely postoperative. No evidence of abscess. 4. Atherosclerosis in the aorta and branching vessels. These results will be called to the ordering clinician or representative by the Radiologist Assistant, and communication documented in the PACS or zVision Dashboard. Electronically Signed   By: Gerome Sam III M.D   On: 01/17/2016 12:04   Dg Chest Port 1 View  Result Date: 01/16/2016 CLINICAL DATA:  Status post PICC line placement EXAM: PORTABLE CHEST 1 VIEW COMPARISON:  01/16/2016 FINDINGS: Right-sided PICC line is now seen with the catheter tip at the cavoatrial junction. A nasogastric catheter is again noted within the stomach. The lungs are clear. Cardiac shadow is stable. Degenerative change of the thoracic spine is noted. IMPRESSION: Status post PICC line in satisfactory position. Electronically Signed   By: Alcide Clever M.D.   On: 01/16/2016 12:38   Dg Abd Acute W/chest  Result Date: 01/16/2016 CLINICAL DATA:  Followup adynamic ileus. EXAM: DG ABDOMEN ACUTE W/ 1V CHEST COMPARISON:  01/13/2016 FINDINGS: There is decreased air within the small bowel a comparative recent prior exam. Small bowel does visible appears nondilated. No free air. Skin staples extend along the low anterior midline, new from the prior exam. A nasogastric tube has its tip projecting in the distal stomach. Soft tissues are otherwise unremarkable. There is linear atelectasis at the lung bases associated with low lung volumes. Lungs are otherwise clear. IMPRESSION: 1. No radiographic findings of a bowel obstruction or significant adynamic ileus. No free air. 2. Mild lung base atelectasis.  No acute cardiopulmonary  disease. Electronically Signed   By: Amie Portland M.D.   On: 01/16/2016 08:39     CBC  Recent Labs Lab 01/13/16 1426 01/14/16 0625 01/16/16 0443 01/17/16 0500 01/18/16 0429  WBC 12.1* 9.3 15.6* 16.5* 12.6*  HGB 14.9 12.0 11.7* 11.1* 10.2*  HCT 44.6 36.2 34.7* 33.6* 30.4*  PLT 373 273 306 300 298  MCV 92.4 92.4 92.4 93.4 92.7  MCH 30.8 30.5 31.2 30.7 31.2  MCHC 33.4 33.0 33.8 32.9 33.7  RDW 14.0 13.7 13.9 14.2 14.2    Chemistries   Recent Labs Lab 01/13/16 1426  01/14/16 0625 01/14/16 1401 01/15/16 0532 01/16/16 0443 01/17/16 0500 01/17/16 1823 01/18/16 0429  NA 131*  < > 137  --  138 137 137  --  132*  K 3.5  < > 3.3* 3.8 3.9 3.7 3.6 3.8 3.8  CL 83*  < > 105  --  106 104 102  --  97*  CO2 30  < > 26  --  25 21* 27  --  30  GLUCOSE 516*  < > 209*  --  119* 86 208*  --  263*  BUN 38*  < > 28*  --  18 10 9   --  11  CREATININE 1.87*  < > 1.02*  --  0.65 0.58 0.44  --  0.41*  CALCIUM 10.1  < > 7.6*  --  8.0* 8.1* 8.1*  --  8.1*  MG  --   < > 1.3* 1.7 1.7 1.5* 1.6*  --  1.6*  AST 19  --   --   --   --   --  15  --   --   ALT 16  --   --   --   --   --  9*  --   --   ALKPHOS 98  --   --   --   --   --  66  --   --   BILITOT 0.7  --   --   --   --   --  1.4*  --   --   < > = values in this interval not displayed. ------------------------------------------------------------------------------------------------------------------ estimated creatinine clearance is 56.4 mL/min (by C-G formula based on SCr of 0.41 mg/dL (L)). ------------------------------------------------------------------------------------------------------------------ No results for input(s): HGBA1C in the last 72 hours. ------------------------------------------------------------------------------------------------------------------  Recent Labs  01/17/16 0500  TRIG 126   ------------------------------------------------------------------------------------------------------------------ No results for  input(s): TSH, T4TOTAL, T3FREE, THYROIDAB in the last 72 hours.  Invalid input(s): FREET3 ------------------------------------------------------------------------------------------------------------------ No results for input(s): VITAMINB12, FOLATE, FERRITIN, TIBC, IRON, RETICCTPCT in the last 72 hours.  Coagulation profile No results for input(s): INR, PROTIME in the last 168 hours.  No results for input(s): DDIMER in the last 72 hours.  Cardiac Enzymes No results for input(s): CKMB, TROPONINI, MYOGLOBIN in the last 168 hours.  Invalid input(s): CK ------------------------------------------------------------------------------------------------------------------ Invalid input(s): POCBNP    Assessment & Plan   #Diabetic2 We'll increase the dose of Lantus To 15 units due to blood sugars increasing currently on TPN  #Essential hypertension- pressure currently stable Blood pressure is stable at this time and patient is nothing by mouth hold home medications IV Lopressor as needed  #Acute kidney injury secondary to dehydration from nausea and vomiting Resolved  #Small bowel obstruction from incarcerated ventral hernia Status post surgery Continue current management  #Hyperlipidemia currently patient is nothing by mouth Home medications on hold  #Hypokalemia and hypomagnesemia replaced  GI prophylaxis with Protonix DVT prophylaxis with SCDs      Code Status Orders        Start     Ordered   01/13/16 1705  Full code  Continuous     01/13/16 1710    Code Status History    Date Active Date Inactive Code Status Order ID Comments User Context   This patient has a current code status but no historical code status.              DVT Prophylaxis  Lovenox   Lab Results  Component Value Date   PLT 298 01/18/2016     Time Spent in minutes  32min  Greater than 50% of time spent in care coordination and counseling patient regarding the condition and plan  of care.   Auburn BilberryPATEL, Jeylin Woodmansee M.D on 01/18/2016 at 1:37 PM  Between 7am to 6pm - Pager - [phone]  After 6pm go to [email or web address] - password [email or web address] [facility]  [facility]   Office  [phone]

## 2016-01-18 NOTE — Evaluation (Signed)
Physical Therapy Evaluation Patient Details Name: [name] MRN: [id number] DOB: 08-Feb-1934 Today's Date: 01/18/2016   History of Present Illness  presented to ER with 5 day history of abdominal pain; admitted with incarcerated periumbilical hernia with high-grade SBO, ketoacidosis (unable to take meds due to nausea/vomiting).  Status post ventral hernia repair with small bowel resection, lysis of adhesions (12/4).  Course complicated by post-op ileus; now with NGT, TPN.  Clinical Impression  Upon evaluation, patient alert and oriented; follows all commands.  Eager for OOB and participation with therapy; generally limited by pain and fatigue with minimal activity.  LEs globally weak and deconditioned, requiring min/mod assist for support (to prevent buckling) in closed-chain positioning.  Currently requiring mod assist for bed mobility; mod assist +1-2 for sit/stand with RW and mod assist +2 for very short-distance gait (5', bed/chair) with RW.  Frequent buckling in LEs with heavy posterior lean in standing; unable to tolerate additional distance due to fatigue. Audible expiratory wheezing noted throughout session; sats maintained >92% on RA at rest and with activity. Would benefit from skilled PT to address above deficits and promote optimal return to PLOF;d recommend transition to STR upon discharge from acute hospitalization.     Follow Up Recommendations SNF    Equipment Recommendations  Rolling walker with 5" wheels    Recommendations for Other Services       Precautions / Restrictions Precautions Precautions: Fall Precaution Comments: NPO, NGT Restrictions Weight Bearing Restrictions: No      Mobility  Bed Mobility Overal bed mobility: Needs Assistance Bed Mobility: Supine to Sit     Supine to sit: Mod assist     General bed mobility comments: extensive assist/cuing for log-rolling  Transfers Overall transfer level: Needs assistance Equipment used: Rolling  walker (2 wheeled) Transfers: Sit to/from Stand Sit to Stand: Mod assist;+2 physical assistance         General transfer comment: assist for lift off, standing balance; heavy posterior lean  Ambulation/Gait Ambulation/Gait assistance: Mod assist;+2 physical assistance Ambulation Distance (Feet): 5 Feet Assistive device: Rolling walker (2 wheeled)       General Gait Details: broad BOS, very short choppy steps; L > R with tendancy to buckle requiring mod assist from therapist to recover and prevent LOB/fall.  Notably fatigued with minimal effort; unabl to tolerate additional distance  Stairs            Wheelchair Mobility    Modified Rankin (Stroke Patients Only)       Balance Overall balance assessment: Needs assistance Sitting-balance support: No upper extremity supported;Feet supported Sitting balance-Leahy Scale: Fair Sitting balance - Comments: posterior trunk lean, min cuing/assist for correction   Standing balance support: Bilateral upper extremity supported Standing balance-Leahy Scale: Poor                               Pertinent Vitals/Pain Pain Assessment: 0-10 Pain Score: 7  Pain Location: abdomen Pain Descriptors / Indicators: Aching;Guarding;Grimacing Pain Intervention(s): Limited activity within patient's tolerance;Monitored during session;Repositioned;Premedicated before session    Home Living Family/patient expects to be discharged to:: Private residence Living Arrangements: Alone Available Help at Discharge: Family;Available PRN/intermittently Type of Home: House         Home Equipment: Gilmer MorCane - single point Additional Comments: Per patient, planning to discharge to daughter's home if able (daughter works 12-hours shifts outside of home); 24/7 not available    Prior Function Level of Independence: Independent with  assistive device(s)         Comments: Mod indep without assist device for ADLs and household mobility; SPC for  community mobility.  Denies fall history.     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)           Lower Extremity Assessment: Generalized weakness (grossly at least 3+ to 4-/5 throughout; bilat LEs with tendancy to buckle in closed-chain WBing positions)         Communication   Communication: No difficulties  Cognition Arousal/Alertness: Awake/alert Behavior During Therapy: WFL for tasks assessed/performed Overall Cognitive Status: Within Functional Limits for tasks assessed                      General Comments      Exercises Other Exercises Other Exercises: Extensive education/assist for log rolling (towards R) Other Exercises: Sitting balance, min assist for midline in A/P plane (tendancy towards posterior trunk lean) Other Exercises: Sit/stand x3 with RW, mod assist +2 for lift off, standing balance an dmildine orientation; blocking L knee for safety   Assessment/Plan    PT Assessment Patient needs continued PT services  PT Problem List Decreased range of motion;Decreased activity tolerance;Decreased balance;Decreased strength;Decreased mobility;Decreased coordination;Decreased cognition;Decreased knowledge of use of DME;Decreased knowledge of precautions;Decreased safety awareness;Cardiopulmonary status limiting activity;Pain;Decreased skin integrity          PT Treatment Interventions DME instruction;Gait training;Stair training;Functional mobility training;Therapeutic activities;Therapeutic exercise;Balance training;Patient/family education    PT Goals (Current goals can be found in the Care Plan section)  Acute Rehab PT Goals Patient Stated Goal: to go home with my daughter PT Goal Formulation: With patient Time For Goal Achievement: 02/01/16 Potential to Achieve Goals: Good    Frequency Min 2X/week   Barriers to discharge Decreased caregiver support       Co-evaluation               End of Session Equipment Utilized During Treatment: Gait belt Activity Tolerance: Patient limited by fatigue;Patient limited by pain Patient left: in chair;with call bell/phone within reach;with family/visitor present (alarm box not available in room; RN informed/aware of patient position. Patient aware of need for assist with all mobility) Nurse Communication: Mobility status         Time: 0950-1020 PT Time Calculation (min) (ACUTE ONLY): 30 min   Charges:   PT Evaluation $PT Eval Moderate Complexity: 1 Procedure PT Treatments $Therapeutic Activity: 8-22 mins   PT G Codes:        Nichael Ehly H. Manson PasseyBrown, PT, DPT, NCS 01/18/16, 10:57 AM [phone]

## 2016-01-18 NOTE — Clinical Social Work Note (Signed)
Clinical Social Work Assessment  Patient Details  Name: [name] MRN: [id number] Date of Birth: 1933/05/13  Date of referral:  01/18/16               Reason for consult:  Facility Placement                Permission sought to share information with:  Family Supports, Chartered certified accountant granted to share information::     Name::        Agency::     Relationship::     Contact Information:     Housing/Transportation Living arrangements for the past 2 months:  Single Family Home Source of Information:  Patient (son) Patient Interpreter Needed:  None Criminal Activity/Legal Involvement Pertinent to Current Situation/Hospitalization:  No - Comment as needed Significant Relationships:  Adult Children Lives with:  Self Do you feel safe going back to the place where you live?  Yes Need for family participation in patient care:  No (Coment)  Care giving concerns:  Baseline patient lives alone and is independent in ADL's.   Social Worker assessment / plan:  CSW met with patient and her son this afternoon to discuss the PT recommendations for STR. Patient states she is hoping to go home at discharge but is willing to let CSW begin a bed search. Patient's son reports that he lives in the house behind patient's house.  Employment status:  Retired Nurse, adult PT Recommendations:  [place] / Referral to community resources:     Patient/Family's Response to care:  Patient and son expressed appreciation for CSW assistance.  Patient/Family's Understanding of and Emotional Response to Diagnosis, Current Treatment, and Prognosis:  Patient's awareness of limitations seems to be somewhat limited. Patient working hard to be able to go home at discharge if she can.  Emotional Assessment Appearance:  Appears stated age Attitude/Demeanor/Rapport:   (pleasant and cooperative) Affect (typically observed):   Accepting, Calm, Pleasant Orientation:  Oriented to Self, Oriented to Place, Oriented to  Time, Oriented to Situation Alcohol / Substance use:  Not Applicable Psych involvement (Current and /or in the community):  No (Comment)  Discharge Needs  Concerns to be addressed:  Care Coordination Readmission within the last 30 days:  No Current discharge risk:  None Barriers to Discharge:  No Barriers Identified   Shela Leff, LCSW 01/18/2016, 1:10 PM

## 2016-01-18 NOTE — Plan of Care (Signed)
Problem: Bowel/Gastric: Goal: Will not experience complications related to bowel motility Outcome: Not Progressing Pt currently has possible ileus and SBO.  Working with physicians and other members of treatment team to resolve.  Bradly Chrisougherty, Kevis Qu E, RN

## 2016-01-18 NOTE — Progress Notes (Signed)
Nutrition Follow-up  DOCUMENTATION CODES:   Non-severe (moderate) malnutrition in context of acute illness/injury  INTERVENTION:  -Recommend continuing current TPN regimen of 5%AA/15%Dextrose at continuous rate of 70 ml/hr with 20% Lipid infusion at 20 ml/hr over 12 hours. Follow sodium trend, pharmacy managing electrolytes and blood sugars  NUTRITION DIAGNOSIS:   Malnutrition related to acute illness as evidenced by mild depletion of muscle mass, percent weight loss, energy intake < 75% for > 7 days.  Being addressed via TPN at present  GOAL:   Patient will meet greater than or equal to 90% of their needs   MONITOR:   Diet advancement, Labs, Weight trends  REASON FOR ASSESSMENT:   Malnutrition Screening Tool    ASSESSMENT:    82 yo female admitted with SBO due to strangulated ventral hernia s/p lysis of adhesions with SB resection with anastamosis. Pt also in DKA and ARF on admission. Pt with hx of DM, HTN, HL, diverticulitis   CT abdomen yesterday with probable ileus, no surgical intervention needed at this time NG tube to suction with 750 mL  5%AA/15%Dextrose infusing at rate of 70 ml/hr via PICC line; 20% Lipids daily for 12 hours at rate of 20 ml/hr  Labs: sodium trending down (132); FSBS 200s, phoshorus wdl, potassium wdl, magnesium 1.6, WBC trending down Meds: 1/2 NS with KCl at 30 ml/hr, ss novolog, lantus  No documented volume of UOP but pt with 8 unmeasured urine occurrences yesterday. Weight trending up  Diet Order:  Diet NPO time specified .TPN (CLINIMIX-E) Adult  Skin:  Reviewed, no issues  Last BM:  11/30 PTA  Height:   Ht Readings from Last 1 Encounters:  01/13/16 5\' 4"  (1.626 m)    Weight:   Wt Readings from Last 1 Encounters:  01/18/16 165 lb (74.8 kg)   Filed Weights   01/16/16 2100 01/17/16 0438 01/18/16 0518  Weight: 154 lb 6.4 oz (70 kg) 161 lb 1.6 oz (73.1 kg) 165 lb (74.8 kg)    BMI:  Body mass index is 28.32 kg/m.  Estimated  Nutritional Needs:   Kcal:  1700-1900 kcals  Protein:  85-95 g  Fluid:  >/= 1.7 L  EDUCATION NEEDS:   No education needs identified at this time  Romelle StarcherCate Malyiah Fellows MS, RD, LDN [phone] Pager  [phone] Weekend/On-Call Pager

## 2016-01-18 NOTE — Plan of Care (Signed)
Problem: Activity: Goal: Risk for activity intolerance will decrease Outcome: Progressing Stood up at bedside for short time; tolerated moderately. Will increase activity. PT assessment requested.

## 2016-01-18 NOTE — Progress Notes (Signed)
PHARMACY - ADULT TOTAL PARENTERAL NUTRITION CONSULT NOTE   Pharmacy Consult for TPN Indication: postop ileus  Patient Measurements: Height: 5\' 4"  (162.6 cm) Weight: 165 lb (74.8 kg) IBW/kg (Calculated) : 54.7 TPN AdjBW (KG): 63.7 Body mass index is 28.32 kg/m.   Assessment: 82 yo female starting on TPN for postop ileus. Currently NPO  Endo: Pt currently on SSI (sensitive scale) q4h and Lantus 10 units qhs BG 81-191 Insulin requirements in the past 24 hours: 44 units SSI + 10 units Lantus  Lytes: K 3.8, Phos 2.7, Mag 1.6   TPN Access: PICC TPN start date: 12/6  Nutritional Goals See dietician note 12/6  Current Nutrition: NPO  Plan:   Will order mag sulfate 2g IV x1. Labs ordered for AM, daily wts, strict I/O.   Per dietician, plan to continue TPN Clinimix E 5/15 at goal rate of 70 ml/hr today and fat emulsion 20% (12 h infusion at rate of 20 ml/hr for total of 250 mL). Clinimix E 5/15 to with 10 ml MVI and 1 ml trace elements.  Fluid (1/2NS with 20 K) rate 30 ml/hr.   Hospitalist has increased Lantus to 15 units qhs for tonight.  SSI: moderate scale (0-15) q4h   Gardner CandleSheema M Brandan Glauber, PharmD, BCPS Clinical Pharmacist 01/18/2016 1:48 PM

## 2016-01-18 NOTE — Progress Notes (Addendum)
Inpatient Diabetes Program Recommendations  AACE/ADA: New Consensus Statement on Inpatient Glycemic Control (2015)  Target Ranges:  Prepandial:   less than 140 mg/dL      Peak postprandial:   less than 180 mg/dL (1-2 hours)      Critically ill patients:  140 - 180 mg/dL   Lab Results  Component Value Date   GLUCAP 231 (H) 01/18/2016   HGBA1C 8.5 (H) 01/13/2016    Review of Glycemic Control  Results for [name], [name] (MRN [id number]) as of 01/18/2016 09:34  Ref. Range 01/17/2016 15:36 01/17/2016 19:54 01/18/2016 00:00 01/18/2016 04:02 01/18/2016 07:42  Glucose-Capillary Latest Ref Range: 65 - 99 mg/dL 409240 (H) 811234 (H) 914257 (H) 257 (H) 231 (H)    Diabetes history: Type 2 Outpatient Diabetes medications: Amaryl 4mg  q day  Current orders for Inpatient glycemic control: Lantus 10 units qhs, Novolog 0-15 units q4h  Inpatient Diabetes Program Recommendations: Novolog correction increased to moderate correction as TPN rate was increased.   If we are not ready to add insulin to the TPN, consider increasing Lantus to 15 units qhs (0.2units/kg)  Susette RacerJulie Courteney Alderete, RN, [place], [place]HA, CDE Diabetes Coordinator Inpatient Diabetes Program  [phone] (Team Pager) [phone] [facility] Office) 01/18/2016 9:39 AM

## 2016-01-18 NOTE — NC FL2 (Signed)
[place] MEDICAID FL2 LEVEL OF CARE SCREENING TOOL     IDENTIFICATION  Patient Name: [name] Birthdate: August 17, 1933 Sex: female Admission Date (Current Location): 01/13/2016  [place]   Facility and Address:  [facility], [address], [place], [place] [address]      Provider Number: [id number]  Attending Physician Name and Address:  Henrene DodgeJose Piscoya, MD  Relative Name and Phone Number:       Current Level of Care: Hospital Recommended Level of Care: Skilled Nursing Facility Prior Approval Number:    Date Approved/Denied:   PASRR Number: [id number]  Discharge Plan: SNF    Current Diagnoses: Patient Active Problem List   Diagnosis Date Noted  . Malnutrition of moderate degree 01/15/2016  . Dehydration   . Incarcerated ventral hernia   . Diabetic ketoacidosis without coma associated with type 2 diabetes mellitus (HCC)   . Type 2 diabetes mellitus with polyneuropathy (HCC) 08/06/2015  . Carpal tunnel syndrome, bilateral 07/10/2015  . Osteopenia of multiple sites 07/10/2015  . Arthralgia of multiple sites 06/27/2015  . Mixed hyperlipidemia 11/06/2014  . Hypomagnesemia 11/02/2013  . Essential hypertension 11/02/2013    Orientation RESPIRATION BLADDER Height & Weight     Self, Time, Situation, Place  Normal Continent Weight: 165 lb (74.8 kg) Height:  5\' 4"  (162.6 cm)  BEHAVIORAL SYMPTOMS/MOOD NEUROLOGICAL BOWEL NUTRITION STATUS   (none)  (none) Continent    AMBULATORY STATUS COMMUNICATION OF NEEDS Skin   Extensive Assist Verbally Surgical wounds                       Personal Care Assistance Level of Assistance  Bathing, Dressing Bathing Assistance: Limited assistance   Dressing Assistance: Limited assistance     Functional Limitations Info             SPECIAL CARE FACTORS FREQUENCY  PT (By licensed PT)                    Contractures Contractures Info: Not present     Additional Factors Info  Code Status, Allergies Code Status Info: full Allergies Info: nka           Current Medications (01/18/2016):  This is the current hospital active medication list Current Facility-Administered Medications  Medication Dose Route Frequency Provider Last Rate Last Dose  . Marland Kitchen.TPN (CLINIMIX-E) Adult   Intravenous Continuous TPN Leafy Roiego F Pabon, MD 70 mL/hr at 01/17/16 1844    . 0.45 % NaCl with KCl 20 mEq / L infusion   Intravenous Continuous Leafy Roiego F Pabon, MD 30 mL/hr at 01/17/16 1727    . albuterol (PROVENTIL) (2.5 MG/3ML) 0.083% nebulizer solution 3 mL  3 mL Inhalation Q4H PRN Lattie Hawichard E Cooper, MD   3 mL at 01/17/16 0651  . chlorhexidine (PERIDEX) 0.12 % solution 15 mL  15 mL Mouth Rinse BID Henrene DodgeJose Piscoya, MD   15 mL at 01/18/16 0935  . ertapenem (INVANZ) 1 g in sodium chloride 0.9 % 50 mL IVPB  1 g Intravenous Q24H Henrene DodgeJose Piscoya, MD   1 g at 01/18/16 0935  . fat emulsion 20 % infusion 250 mL  250 mL Intravenous Continuous TPN Leafy Roiego F Pabon, MD 20 mL/hr at 01/17/16 1848 250 mL at 01/17/16 1848  . hydrALAZINE (APRESOLINE) injection 10 mg  10 mg Intravenous Q6H PRN Henrene DodgeJose Piscoya, MD      . HYDROmorphone (DILAUDID) injection 0.5 mg  0.5 mg Intravenous Q3H PRN  Gladis Riffleatherine L Loflin, MD   0.5 mg at 01/18/16 [id number]  . insulin aspart (novoLOG) injection 0-15 Units  0-15 Units Subcutaneous Q4H Leafy Roiego F Pabon, MD   5 Units at 01/18/16 0802  . insulin glargine (LANTUS) injection 10 Units  10 Units Subcutaneous QHS Auburn BilberryShreyang Patel, MD   10 Units at 01/17/16 2142  . MEDLINE mouth rinse  15 mL Mouth Rinse q12n4p Henrene DodgeJose Piscoya, MD   15 mL at 01/18/16 1200  . menthol-cetylpyridinium (CEPACOL) lozenge 3 mg  1 lozenge Oral PRN Henrene DodgeJose Piscoya, MD      . metoprolol (LOPRESSOR) injection 5 mg  5 mg Intravenous Q4H PRN Ramonita LabAruna Gouru, MD      . ondansetron (ZOFRAN-ODT) disintegrating tablet 4 mg  4 mg Oral Q6H PRN Henrene DodgeJose Piscoya, MD   4 mg at 01/15/16 0736   Or  . ondansetron (ZOFRAN) injection 4 mg  4 mg  Intravenous Q6H PRN Henrene DodgeJose Piscoya, MD      . pantoprazole (PROTONIX) injection 40 mg  40 mg Intravenous Q24H Ramonita LabAruna Gouru, MD   40 mg at 01/17/16 2030     Discharge Medications: Please see discharge summary for a list of discharge medications.  Relevant Imaging Results:  Relevant Lab Results:   Additional Information    [name] SpanielMonica Monae Topping, LCSW

## 2016-01-18 NOTE — Care Management (Signed)
Patient post op Ventral Hernia repair, lysis of Adhesions with Small Bowel Resection . Patient POD day 4 with complications of ileus vs SBO.  Patient currently is NPO with NG to suction, and requiring TPN.  PT has assessed patient and currently recommending SNF.  Patient was agreeable to bed search, however if she improves wishes to return home.  At discharge plans to live with her daughter for support if appropriate, however her daughter works 12 hour shirts.  RNCM following.

## 2016-01-19 ENCOUNTER — Inpatient Hospital Stay: Payer: Medicare Other

## 2016-01-19 LAB — BASIC METABOLIC PANEL
Anion gap: 7 (ref 5–15)
BUN: 15 mg/dL (ref 6–20)
CALCIUM: 8.3 mg/dL — AB (ref 8.9–10.3)
CO2: 27 mmol/L (ref 22–32)
CREATININE: 0.43 mg/dL — AB (ref 0.44–1.00)
Chloride: 100 mmol/L — ABNORMAL LOW (ref 101–111)
GFR calc non Af Amer: >60 mL/min (ref >60)
Glucose, Bld: 246 mg/dL — ABNORMAL HIGH (ref 65–99)
Potassium: 4 mmol/L (ref 3.5–5.1)
Sodium: 134 mmol/L — ABNORMAL LOW (ref 135–145)

## 2016-01-19 LAB — CBC
HEMATOCRIT: 30.1 % — AB (ref 35.0–47.0)
Hemoglobin: 10 g/dL — ABNORMAL LOW (ref 12.0–16.0)
MCH: 30.6 pg (ref 26.0–34.0)
MCHC: 33.1 g/dL (ref 32.0–36.0)
MCV: 92.2 fL (ref 80.0–100.0)
Platelets: 321 10*3/uL (ref 150–440)
RBC: 3.27 MIL/uL — ABNORMAL LOW (ref 3.80–5.20)
RDW: 13.9 % (ref 11.5–14.5)
WBC: 16.7 10*3/uL — ABNORMAL HIGH (ref 3.6–11.0)

## 2016-01-19 LAB — PHOSPHORUS: PHOSPHORUS: 2.8 mg/dL (ref 2.5–4.6)

## 2016-01-19 LAB — GLUCOSE, CAPILLARY
GLUCOSE-CAPILLARY: 234 mg/dL — AB (ref 65–99)
GLUCOSE-CAPILLARY: 243 mg/dL — AB (ref 65–99)
GLUCOSE-CAPILLARY: 271 mg/dL — AB (ref 65–99)
Glucose-Capillary: 280 mg/dL — ABNORMAL HIGH (ref 65–99)
Glucose-Capillary: 203 mg/dL — ABNORMAL HIGH (ref 65–99)

## 2016-01-19 LAB — MAGNESIUM: Magnesium: 1.6 mg/dL — ABNORMAL LOW (ref 1.7–2.4)

## 2016-01-19 MED ORDER — FUROSEMIDE 10 MG/ML IJ SOLN
20.0000 mg | Freq: Once | INTRAMUSCULAR | Status: AC
  Administered 2016-01-19: 20 mg via INTRAVENOUS
  Filled 2016-01-19: qty 2

## 2016-01-19 MED ORDER — FAT EMULSION 20 % IV EMUL
250.0000 mL | INTRAVENOUS | Status: DC
  Filled 2016-01-19: qty 250

## 2016-01-19 MED ORDER — MAGNESIUM SULFATE 4 GM/100ML IV SOLN
4.0000 g | Freq: Once | INTRAVENOUS | Status: AC
Start: 2016-01-19 — End: 2016-01-19
  Administered 2016-01-19: 4 g via INTRAVENOUS
  Filled 2016-01-19: qty 100

## 2016-01-19 MED ORDER — METOPROLOL TARTRATE 25 MG PO TABS
12.5000 mg | ORAL_TABLET | Freq: Two times a day (BID) | ORAL | Status: DC
  Administered 2016-01-19 – 2016-01-25 (×13): 12.5 mg via ORAL
  Filled 2016-01-19 (×13): qty 1

## 2016-01-19 MED ORDER — INSULIN GLARGINE 100 UNIT/ML ~~LOC~~ SOLN
18.0000 [IU] | Freq: Every day | SUBCUTANEOUS | Status: DC
  Administered 2016-01-19: 18 [IU] via SUBCUTANEOUS
  Filled 2016-01-19 (×2): qty 0.18

## 2016-01-19 MED ORDER — MAGNESIUM SULFATE IN D5W 1-5 GM/100ML-% IV SOLN
1.0000 g | Freq: Once | INTRAVENOUS | Status: DC
  Filled 2016-01-19: qty 100

## 2016-01-19 MED ORDER — TRACE MINERALS CR-CU-MN-SE-ZN 10-1000-500-60 MCG/ML IV SOLN: INTRAVENOUS | Status: DC

## 2016-01-19 MED ORDER — IPRATROPIUM-ALBUTEROL 0.5-2.5 (3) MG/3ML IN SOLN
3.0000 mL | Freq: Four times a day (QID) | RESPIRATORY_TRACT | Status: DC
Start: 2016-01-19 — End: 2016-01-22
  Administered 2016-01-19 – 2016-01-22 (×12): 3 mL via RESPIRATORY_TRACT
  Filled 2016-01-19 (×13): qty 3

## 2016-01-19 MED ORDER — M.V.I. ADULT IV INJ
INJECTION | INTRAVENOUS | Status: AC
  Administered 2016-01-19: 18:00:00 via INTRAVENOUS
  Filled 2016-01-19: qty 1680

## 2016-01-19 MED ORDER — MAGNESIUM SULFATE 2 GM/50ML IV SOLN
2.0000 g | Freq: Once | INTRAVENOUS | Status: DC
  Filled 2016-01-19: qty 50

## 2016-01-19 NOTE — Progress Notes (Signed)
Patient ID: [name] CreedHallie G Parkin, female   DOB: Mar 07, 1933, 82 y.o.   MRN: [id number]  [facility] PROGRESS NOTE  [name] CreedHallie G [name] WUJ:[id number] DOB: Mar 07, 1933 DOA: 01/13/2016 PCP: Leotis ShamesSingh,Jasmine, MD  HPI/Subjective: Patient occasionally has some abdominal pain. No nausea or vomiting with the NG tube in. No passing gas yet. Patient with some wheeze and cough.  Objective: Vitals:   01/18/16 2004 01/19/16 0532  BP: 131/63 (!) 157/75  Pulse: (!) 111 (!) 115  Resp: 20 20  Temp: 98.2 F (36.8 C) 97.9 F (36.6 C)    Filed Weights   01/17/16 0438 01/18/16 0518 01/19/16 0500  Weight: 73.1 kg (161 lb 1.6 oz) 74.8 kg (165 lb) 73.6 kg (162 lb 4.8 oz)    ROS: Review of Systems  Constitutional: Negative for chills and fever.  Eyes: Negative for blurred vision.  Respiratory: Positive for cough and wheezing. Negative for shortness of breath.   Cardiovascular: Negative for chest pain.  Gastrointestinal: Positive for abdominal pain and constipation. Negative for diarrhea, nausea and vomiting.  Genitourinary: Negative for dysuria.  Musculoskeletal: Negative for joint pain.  Neurological: Negative for dizziness and headaches.   Exam: Physical Exam  Constitutional: She is oriented to person, place, and time.  HENT:  Nose: No mucosal edema.  Mouth/Throat: No oropharyngeal exudate or posterior oropharyngeal edema.  Eyes: Conjunctivae, EOM and lids are normal. Pupils are equal, round, and reactive to light.  Neck: No JVD present. Carotid bruit is not present. No edema present. No thyroid mass and no thyromegaly present.  Cardiovascular: S1 normal and S2 normal.  Exam reveals no gallop.   No murmur heard. Pulses:      Dorsalis pedis pulses are 2+ on the right side, and 2+ on the left side.  Respiratory: No respiratory distress. She has no wheezes. She has no rhonchi. She has no rales.  Upper airway wheeze  GI: Soft. Bowel sounds are absent. There is tenderness in the suprapubic area.   Musculoskeletal:       Right ankle: She exhibits no swelling.       Left ankle: She exhibits no swelling.  Lymphadenopathy:    She has no cervical adenopathy.  Neurological: She is alert and oriented to person, place, and time. No cranial nerve deficit.  Skin: Skin is warm. No rash noted. Nails show no clubbing.  Psychiatric: She has a normal mood and affect.      Data Reviewed: Basic Metabolic Panel:  Recent Labs Lab 01/15/16 0532 01/16/16 0443 01/17/16 0500 01/17/16 1823 01/18/16 0429 01/19/16 0655  NA 138 137 137  --  132* 134*  K 3.9 3.7 3.6 3.8 3.8 4.0  CL 106 104 102  --  97* 100*  CO2 25 21* 27  --  30 27  GLUCOSE 119* 86 208*  --  263* 246*  BUN 18 10 9   --  11 15  CREATININE 0.65 0.58 0.44  --  0.41* 0.43*  CALCIUM 8.0* 8.1* 8.1*  --  8.1* 8.3*  MG 1.7 1.5* 1.6*  --  1.6* 1.6*  PHOS  --  2.0* 2.0* 2.6 2.7 2.8   Liver Function Tests:  Recent Labs Lab 01/13/16 1426 01/17/16 0500  AST 19 15  ALT 16 9*  ALKPHOS 98 66  BILITOT 0.7 1.4*  PROT 8.6* 5.9*  ALBUMIN 4.2 2.3*   CBC:  Recent Labs Lab 01/14/16 0625 01/16/16 0443 01/17/16 0500 01/18/16 0429 01/19/16 0655  WBC 9.3 15.6* 16.5* 12.6* 16.7*  HGB 12.0 11.7* 11.1*  10.2* 10.0*  HCT 36.2 34.7* 33.6* 30.4* 30.1*  MCV 92.4 92.4 93.4 92.7 92.2  PLT 273 306 300 298 321    CBG:  Recent Labs Lab 01/18/16 1639 01/18/16 2002 01/18/16 2317 01/19/16 0520 01/19/16 0805  GLUCAP 267* 184* 220* 280* 234*    Recent Results (from the past 240 hour(s))  MRSA PCR Screening     Status: None   Collection Time: 01/13/16  6:39 PM  Result Value Ref Range Status   MRSA by PCR NEGATIVE NEGATIVE Final    Comment:        The GeneXpert MRSA Assay (FDA approved for NASAL specimens only), is one component of a comprehensive MRSA colonization surveillance program. It is not intended to diagnose MRSA infection nor to guide or monitor treatment for MRSA infections.      Studies: Ct Abdomen Pelvis W  Contrast  Result Date: 01/17/2016 CLINICAL DATA:  Small bowel resection January 14, 2016 for incarcerated hernia. Abdominal distention. EXAM: CT ABDOMEN AND PELVIS WITH CONTRAST TECHNIQUE: Multidetector CT imaging of the abdomen and pelvis was performed using the standard protocol following bolus administration of intravenous contrast. CONTRAST:  100mL ISOVUE-300 IOPAMIDOL (ISOVUE-300) INJECTION 61% COMPARISON:  January 13, 2016 FINDINGS: Lower chest: New small bilateral pleural effusions with associated atelectasis. Coronary artery calcifications. The lung bases are otherwise unchanged and unremarkable. Hepatobiliary: No focal liver abnormality is seen. No gallstones, gallbladder wall thickening, or biliary dilatation. Pancreas: Unremarkable. No pancreatic ductal dilatation or surrounding inflammatory changes. Spleen: Normal in size without focal abnormality. Adrenals/Urinary Tract: The adrenal glands are normal. Nodular contour to the bilateral kidneys is unchanged with multiple small cysts but no evidence of obstruction. Stomach/Bowel: There is an NG tube terminating in the stomach. The stomach is normal in appearance. The small bowel is dilated to the level of surgical anastomosis in the left lower quadrant. There is a focus of air near the region of anastomosis on series 2, image 53. It is unclear whether this air is intraluminal or extraluminal. Even if it is extraluminal, I suspect it is postsurgical as there are no adjacent fluid collections or abscess to suggest leak. Distal to the anastomosis, the small bowel is decompressed. Scattered colonic diverticulosis is seen without diverticulitis. The colon is relatively decompressed compared to the small bowel. The appendix is not seen but there is no secondary evidence of appendicitis. Vascular/Lymphatic: Atherosclerotic changes seen in the non aneurysmal aorta, its branching vessels, the iliac vessels, and femoral vessels. No adenopathy. Reproductive: A  vaginal pessary is identified. The uterus is unremarkable. The left ovary is normal in appearance. There is a 2 cm cyst in the right ovary which is unchanged. Other: The incarcerated hernia has been reduced. Free fluid is likely postoperative. A tiny amount of intraperitoneal air is also likely postoperative given surgery just a couple of days ago. Musculoskeletal: Loss of height of L5 is unchanged. No bony changes. IMPRESSION: 1. There is a small bowel obstruction. The transition point is at the anastomosis in the left lower quadrant from the recent surgery. An NG tube terminates in the stomach. 2. Reduction of previous incarcerated hernia. 3. A small amount of free fluid and a tiny amount of intraperitoneal air is likely postoperative. No evidence of abscess. 4. Atherosclerosis in the aorta and branching vessels. These results will be called to the ordering clinician or representative by the Radiologist Assistant, and communication documented in the PACS or zVision Dashboard. Electronically Signed   By: Gerome Samavid  Williams III M.D  On: 01/17/2016 12:04    Scheduled Meds: . chlorhexidine  15 mL Mouth Rinse BID  . ertapenem  1 g Intravenous Q24H  . insulin aspart  0-15 Units Subcutaneous Q4H  . insulin glargine  15 Units Subcutaneous QHS  . mouth rinse  15 mL Mouth Rinse q12n4p  . pantoprazole (PROTONIX) IV  40 mg Intravenous Q24H   Continuous Infusions: . Marland KitchenTPN (CLINIMIX-E) Adult 70 mL/hr at 01/18/16 1712    Assessment/Plan:  1. Type 2 diabetes mellitus. TPN is raising the patient sugars. increased Lantus 18 units at night and sliding scale every 4 hours. 2. Essential hypertension and tachycardia. Start low-dose metoprolol twice a day since blood pressure is starting to creep up a little bit. 3. Wheeze. Start standing dose nebulizers. Obtain a chest x-ray. 4. Small bowel obstruction. Abdominal x-ray. Nurse concerned that the NG tube is not draining and concerned if this is in the right spot. Patient  on empiric ertapenem. Surgery following. 5. Status post ventral hernia repair. Surgical follow-up. 6. Weakness. Physical therapy recommends rehabilitation 7. Nutrition. Patient on TPN.  Code Status:     Code Status Orders        Start     Ordered   01/13/16 1705  Full code  Continuous     01/13/16 1710    Code Status History    Date Active Date Inactive Code Status Order ID Comments User Context   This patient has a current code status but no historical code status.     Family Communication: I received permission to speak in front of numerous family members at the bedside Disposition Plan: patient will likely require rehabilitation. Patient will be in the hospital until she is able to tolerate solid food and has bowel movements.  Consultants:  General surgery  Procedures:  Ventral hernia repair  Antibiotics:  Invanz  Time spent: 28 minutes  Alford Highland  [employer]

## 2016-01-19 NOTE — Progress Notes (Addendum)
PHARMACY - ADULT TOTAL PARENTERAL NUTRITION CONSULT NOTE   Pharmacy Consult for TPN Indication: postop ileus  Patient Measurements: Height: 5\' 4"  (162.6 cm) Weight: 162 lb 4.8 oz (73.6 kg) IBW/kg (Calculated) : 54.7 TPN AdjBW (KG): 63.7 Body mass index is 27.86 kg/m.   Assessment: 82 yo female starting on TPN for postop ileus. Currently NPO  Endo: Current orders for SSI (moderate scale), lantus 15 units QHS . Has required 29 units of SSI in past 24 hours   Lytes: K 4.0, Phos 2.8, Mg 1.6  TPN Access: PICC TPN start date: 12/6  Nutritional Goals See dietician note 12/6  Current Nutrition: NPO  Plan:  Ordered magnesium 4gm IV x 1  Will continue TPN Clinimix E 5/15 at goal rate of 70 ml/hr today and fat emulsion 20% (12 h infusion at rate of 20 ml/hr for total of 250 mL). Clinimix E 5/15 to with 10 ml MVI and 1 ml trace elements.  Continue SSI moderate scale. Insulin glargine increased to 18 units QHS. BG up to 280 this AM (203-280) Will add 10 units of insulin to TPN. Discussed with RN that insulin added to TPN. Will continue to follow BG and adjust as needed.   Addendum: lipids were infused at a rate of 5020ml/hr over 24hr instead of 12hr yesterday. Will hold lipid infusion today.  Olene FlossMelissa D Maccia, Pharm.D, BCPS Clinical Pharmacist  01/19/2016 11:49 AM

## 2016-01-19 NOTE — Progress Notes (Signed)
CC: POD # 5 sb resection for inc hernia Subjective: Feeling better, no flatus, working w PT but may require SNIF Wbc back up again  Objective: Vital signs in last 24 hours: Temp:  [97.9 F (36.6 C)-98.2 F (36.8 C)] 97.9 F (36.6 C) (12/09 0532) Pulse Rate:  [109-116] 115 (12/09 0532) Resp:  [17-20] 20 (12/09 0532) BP: (118-157)/(59-75) 157/75 (12/09 0532) SpO2:  [94 %-99 %] 97 % (12/09 0532) Weight:  [73.6 kg (162 lb 4.8 oz)] 73.6 kg (162 lb 4.8 oz) (12/09 0500) Last BM Date: 01/14/16  Intake/Output from previous day: 12/08 0701 - 12/09 0700 In: 1534 [I.V.:1534] Out: 100 [Emesis/NG output:100] Intake/Output this shift: Total I/O In: 557 [P.O.:1; I.V.:526; NG/GT:30] Out: 0   Physical exam: NAD Abd: soft, decrease bs , incision c/d/i, no infection , no peritonitis   Lab Results: CBC   Recent Labs  01/18/16 0429 01/19/16 0655  WBC 12.6* 16.7*  HGB 10.2* 10.0*  HCT 30.4* 30.1*  PLT 298 321   BMET  Recent Labs  01/18/16 0429 01/19/16 0655  NA 132* 134*  K 3.8 4.0  CL 97* 100*  CO2 30 27  GLUCOSE 263* 246*  BUN 11 15  CREATININE 0.41* 0.43*  CALCIUM 8.1* 8.3*   PT/INR No results for input(s): LABPROT, INR in the last 72 hours. ABG No results for input(s): PHART, HCO3 in the last 72 hours.  Invalid input(s): PCO2, PO2  Studies/Results: Ct Abdomen Pelvis W Contrast  Result Date: 01/17/2016 CLINICAL DATA:  Small bowel resection January 14, 2016 for incarcerated hernia. Abdominal distention. EXAM: CT ABDOMEN AND PELVIS WITH CONTRAST TECHNIQUE: Multidetector CT imaging of the abdomen and pelvis was performed using the standard protocol following bolus administration of intravenous contrast. CONTRAST:  100mL ISOVUE-300 IOPAMIDOL (ISOVUE-300) INJECTION 61% COMPARISON:  January 13, 2016 FINDINGS: Lower chest: New small bilateral pleural effusions with associated atelectasis. Coronary artery calcifications. The lung bases are otherwise unchanged and unremarkable.  Hepatobiliary: No focal liver abnormality is seen. No gallstones, gallbladder wall thickening, or biliary dilatation. Pancreas: Unremarkable. No pancreatic ductal dilatation or surrounding inflammatory changes. Spleen: Normal in size without focal abnormality. Adrenals/Urinary Tract: The adrenal glands are normal. Nodular contour to the bilateral kidneys is unchanged with multiple small cysts but no evidence of obstruction. Stomach/Bowel: There is an NG tube terminating in the stomach. The stomach is normal in appearance. The small bowel is dilated to the level of surgical anastomosis in the left lower quadrant. There is a focus of air near the region of anastomosis on series 2, image 53. It is unclear whether this air is intraluminal or extraluminal. Even if it is extraluminal, I suspect it is postsurgical as there are no adjacent fluid collections or abscess to suggest leak. Distal to the anastomosis, the small bowel is decompressed. Scattered colonic diverticulosis is seen without diverticulitis. The colon is relatively decompressed compared to the small bowel. The appendix is not seen but there is no secondary evidence of appendicitis. Vascular/Lymphatic: Atherosclerotic changes seen in the non aneurysmal aorta, its branching vessels, the iliac vessels, and femoral vessels. No adenopathy. Reproductive: A vaginal pessary is identified. The uterus is unremarkable. The left ovary is normal in appearance. There is a 2 cm cyst in the right ovary which is unchanged. Other: The incarcerated hernia has been reduced. Free fluid is likely postoperative. A tiny amount of intraperitoneal air is also likely postoperative given surgery just a couple of days ago. Musculoskeletal: Loss of height of L5 is unchanged. No bony changes.  IMPRESSION: 1. There is a small bowel obstruction. The transition point is at the anastomosis in the left lower quadrant from the recent surgery. An NG tube terminates in the stomach. 2. Reduction of  previous incarcerated hernia. 3. A small amount of free fluid and a tiny amount of intraperitoneal air is likely postoperative. No evidence of abscess. 4. Atherosclerosis in the aorta and branching vessels. These results will be called to the ordering clinician or representative by the Radiologist Assistant, and communication documented in the PACS or zVision Dashboard. Electronically Signed   By: Gerome Samavid  Williams III M.D   On: 01/17/2016 12:04    Anti-infectives: Anti-infectives    Start     Dose/Rate Route Frequency Ordered Stop   01/15/16 1000  ertapenem (INVANZ) 0.5 g in sodium chloride 0.9 % 50 mL IVPB  Status:  Discontinued     500 mg 100 mL/hr over 30 Minutes Intravenous Every 24 hours 01/14/16 0542 01/14/16 1346   01/15/16 1000  ertapenem (INVANZ) 1 g in sodium chloride 0.9 % 50 mL IVPB     1 g 100 mL/hr over 30 Minutes Intravenous Every 24 hours 01/14/16 1346     01/15/16 0000  ertapenem (INVANZ) 1 g in sodium chloride 0.9 % 50 mL IVPB  Status:  Discontinued     1 g 100 mL/hr over 30 Minutes Intravenous Every 24 hours 01/14/16 0454 01/14/16 0541   01/14/16 0215  ertapenem (INVANZ) 1 g in sodium chloride 0.9 % 50 mL IVPB     1 g 100 mL/hr over 30 Minutes Intravenous  Once 01/14/16 0214 01/14/16 0330      Assessment/Plan: Ileus given persistent WBC we will continue A/Bs Continue TPN and NGT Adjust insulin needs per prime doc mobilize  Sterling Bigiego Pabon, MD, [facility]  01/19/2016

## 2016-01-20 LAB — TRIGLYCERIDES: TRIGLYCERIDES: 96 mg/dL (ref <150)

## 2016-01-20 LAB — GLUCOSE, CAPILLARY
GLUCOSE-CAPILLARY: 158 mg/dL — AB (ref 65–99)
GLUCOSE-CAPILLARY: 202 mg/dL — AB (ref 65–99)
GLUCOSE-CAPILLARY: 211 mg/dL — AB (ref 65–99)
GLUCOSE-CAPILLARY: 218 mg/dL — AB (ref 65–99)
GLUCOSE-CAPILLARY: 223 mg/dL — AB (ref 65–99)
GLUCOSE-CAPILLARY: 230 mg/dL — AB (ref 65–99)
Glucose-Capillary: 224 mg/dL — ABNORMAL HIGH (ref 65–99)

## 2016-01-20 LAB — BASIC METABOLIC PANEL
Anion gap: 6 (ref 5–15)
BUN: 17 mg/dL (ref 6–20)
CHLORIDE: 99 mmol/L — AB (ref 101–111)
CO2: 30 mmol/L (ref 22–32)
Calcium: 8.4 mg/dL — ABNORMAL LOW (ref 8.9–10.3)
Creatinine, Ser: 0.42 mg/dL — ABNORMAL LOW (ref 0.44–1.00)
GFR calc non Af Amer: >60 mL/min (ref >60)
Glucose, Bld: 216 mg/dL — ABNORMAL HIGH (ref 65–99)
POTASSIUM: 3.9 mmol/L (ref 3.5–5.1)
SODIUM: 135 mmol/L (ref 135–145)

## 2016-01-20 LAB — MAGNESIUM: MAGNESIUM: 1.8 mg/dL (ref 1.7–2.4)

## 2016-01-20 LAB — PHOSPHORUS: PHOSPHORUS: 2.9 mg/dL (ref 2.5–4.6)

## 2016-01-20 MED ORDER — TRACE MINERALS CR-CU-MN-SE-ZN 10-1000-500-60 MCG/ML IV SOLN
INTRAVENOUS | Status: AC
  Administered 2016-01-20: 18:00:00 via INTRAVENOUS
  Filled 2016-01-20: qty 1680

## 2016-01-20 MED ORDER — INSULIN GLARGINE 100 UNIT/ML ~~LOC~~ SOLN
20.0000 [IU] | Freq: Every day | SUBCUTANEOUS | Status: DC
  Administered 2016-01-20 – 2016-01-21 (×2): 20 [IU] via SUBCUTANEOUS
  Filled 2016-01-20 (×4): qty 0.2

## 2016-01-20 MED ORDER — GUAIFENESIN 100 MG/5ML PO SOLN
5.0000 mL | ORAL | Status: DC | PRN
  Administered 2016-01-20: 100 mg via ORAL
  Filled 2016-01-20: qty 10

## 2016-01-20 MED ORDER — FAT EMULSION 20 % IV EMUL
240.0000 mL | INTRAVENOUS | Status: AC
  Administered 2016-01-20: 240 mL via INTRAVENOUS
  Filled 2016-01-20: qty 250

## 2016-01-20 MED ORDER — FUROSEMIDE 10 MG/ML IJ SOLN
20.0000 mg | Freq: Every day | INTRAMUSCULAR | Status: DC
  Administered 2016-01-21 – 2016-01-25 (×5): 20 mg via INTRAVENOUS
  Filled 2016-01-20 (×5): qty 2

## 2016-01-20 NOTE — Progress Notes (Signed)
Nutrition Follow-up  DOCUMENTATION CODES:   Non-severe (moderate) malnutrition in context of acute illness/injury  INTERVENTION:  -TPN: pt remains NPO, recommend continuing 5%AA/15%Dextrose at rate of 70 ml/hr with infusion of 20% lipids (at rate of 20 ml/hr for 12 hours) daily. Recommend continuing TPN at goal rate until pt tolerating solid food diet (as long as TPN tolerated). Pharmacy managing glucose and electrolytes.   NUTRITION DIAGNOSIS:   Malnutrition related to acute illness as evidenced by mild depletion of muscle mass, percent weight loss, energy intake < 75% for > 7 days.  Being addressed via TPN  GOAL:   Patient will meet greater than or equal to 90% of their needs  MONITOR:   Diet advancement, Labs, Weight trends  REASON FOR ASSESSMENT:   Malnutrition Screening Tool    ASSESSMENT:    82 yo female admitted with SBO due to strangulated ventral hernia s/p lysis of adhesions with SB resection with anastamosis. Pt also in DKA and ARF on admission. Pt with hx of DM, HTN, HL, diverticulitis  Tolerating 5%AA/15%Dextrose at rate of 70 ml/hr; 20% Lipids at rate of 20 ml/hr for 12 hours via PICC line. Lipid infusion held yesterday due to 24 hour lipid infusion the previous day  Abdomen distended, BS hypoactive, +Flatus, no BM. NG to LCS with 450 mL out per I/O flowsheet Pt with 10 unmeasured urine occurrences yesterday, no edema  Labs: FSBS 200s, sodium/potassium/phosphorus/magnesium wdl; calcium 8.4 (last albumin 2.7 on 12/7-corrected calcium 9.44), CL 99 Meds: ss novolog, lantus plus insulin in TPN, 1 time dose of lasix yesterday  Diet Order:  Diet NPO time specified .TPN (CLINIMIX-E) Adult  Skin:  Reviewed, no issues  Last BM:  11/30 PTA  Height:   Ht Readings from Last 1 Encounters:  01/13/16 5\' 4"  (1.626 m)    Weight:   Wt Readings from Last 1 Encounters:  01/20/16 162 lb 6.4 oz (73.7 kg)    Filed Weights   01/18/16 0518 01/19/16 0500 01/20/16 0423   Weight: 165 lb (74.8 kg) 162 lb 4.8 oz (73.6 kg) 162 lb 6.4 oz (73.7 kg)    BMI:  Body mass index is 27.88 kg/m.  Estimated Nutritional Needs:   Kcal:  1700-1900 kcals  Protein:  85-95 g  Fluid:  >/= 1.7 L  EDUCATION NEEDS:   No education needs identified at this time  [name] StarcherCate Meliton Samad MS, RD, LDN [phone] Pager  [phone] Weekend/On-Call Pager

## 2016-01-20 NOTE — Progress Notes (Signed)
PHARMACY - ADULT TOTAL PARENTERAL NUTRITION CONSULT NOTE   Pharmacy Consult for TPN Indication: postop ileus  Patient Measurements: Height: 5\' 4"  (162.6 cm) Weight: 162 lb 6.4 oz (73.7 kg) IBW/kg (Calculated) : 54.7 TPN AdjBW (KG): 63.7 Body mass index is 27.88 kg/m.  Assessment: 82 yo female starting on TPN for postop ileus. Currently NPO  Endo: Current orders for SSI (moderate scale), lantus 18 units QHS . Has required 33 units of correctional insulin in the past 24 hours  Lytes: K 3.9, Phos 2.9, Mg 1.8  TPN Access: PICC TPN start date: 12/6  Nutritional Goals Per dietician recommendations (see note)   Current Nutrition: NPO. Patient is currently on TPN Clinimix E 5/15 at a rate of 70 mL/hr.   Plan:  Electrolytes WNL. No supplementation needed at this time.  Continue TPN Clinimix E 5/15 at 70 mL/hr per dietician recommendations. TPN contains MVI and trace elements. Will increase insulin in TPN to 20 units based on correctional insulin requirements over the past 24 hours.  Cindi CarbonMary M Jerrion Tabbert, Pharm.D, BCPS Clinical Pharmacist 01/20/2016 11:25 AM

## 2016-01-20 NOTE — Plan of Care (Signed)
Problem: Activity: Goal: Risk for activity intolerance will decrease Outcome: Progressing Patient sat on edge of bed and stood with walker for a few minutes.  Problem: Fluid Volume: Goal: Ability to maintain a balanced intake and output will improve Outcome: Not Progressing Patient is NPO.  Problem: Nutrition: Goal: Adequate nutrition will be maintained Outcome: Not Progressing Patient is NPO.  Problem: Bowel/Gastric: Goal: Will not experience complications related to bowel motility Outcome: Not Progressing Hypoactive bowel sounds and not passing flatus.  Problem: Nutrition: Goal: Ability to attain and maintain optimal nutritional status will improve Outcome: Not Progressing Patient is NPO.

## 2016-01-20 NOTE — Progress Notes (Signed)
7 Days Post-Op   Subjective:  82 year old female status post repair of incarcerated ventral hernia with prolonged ileus currently on TPN. Patient reports that he she thinks she passed some flatus earlier this morning. He also reports overall feeling better than the day before.  Vital signs in last 24 hours: Temp:  [97.6 F (36.4 C)-98 F (36.7 C)] 97.9 F (36.6 C) (12/10 0441) Pulse Rate:  [106-116] 106 (12/10 0441) Resp:  [18-20] 18 (12/10 0441) BP: (116-150)/(66-97) 132/66 (12/10 0441) SpO2:  [96 %-100 %] 100 % (12/10 0441) Weight:  [73.7 kg (162 lb 6.4 oz)] 73.7 kg (162 lb 6.4 oz) (12/10 0423) Last BM Date: 01/10/16  Intake/Output from previous day: 12/09 0701 - 12/10 0700 In: 1187 [P.O.:1; I.V.:1156; NG/GT:30] Out: 450 [Emesis/NG output:450]  GI: Abdomen soft, nondistended, appropriately tender to palpation at her incision sites. Staples in place without evidence of spreading erythema or drainage.  Lab Results:  CBC  Recent Labs  01/18/16 0429 01/19/16 0655  WBC 12.6* 16.7*  HGB 10.2* 10.0*  HCT 30.4* 30.1*  PLT 298 321   CMP     Component Value Date/Time   NA 135 01/20/2016 0423   NA 143 09/24/2013 0409   K 3.9 01/20/2016 0423   K 3.6 09/24/2013 0409   CL 99 (L) 01/20/2016 0423   CL 108 (H) 09/24/2013 0409   CO2 30 01/20/2016 0423   CO2 27 09/24/2013 0409   GLUCOSE 216 (H) 01/20/2016 0423   GLUCOSE 222 (H) 09/24/2013 0409   BUN 17 01/20/2016 0423   BUN 7 09/24/2013 0409   CREATININE 0.42 (L) 01/20/2016 0423   CREATININE 0.77 09/24/2013 0409   CALCIUM 8.4 (L) 01/20/2016 0423   CALCIUM 7.7 (L) 09/24/2013 0409   PROT 5.9 (L) 01/17/2016 0500   PROT 5.4 (L) 09/22/2013 0507   ALBUMIN 2.3 (L) 01/17/2016 0500   ALBUMIN 2.3 (L) 09/22/2013 0507   AST 15 01/17/2016 0500   AST 19 09/22/2013 0507   ALT 9 (L) 01/17/2016 0500   ALT 13 (L) 09/22/2013 0507   ALKPHOS 66 01/17/2016 0500   ALKPHOS 75 09/22/2013 0507   BILITOT 1.4 (H) 01/17/2016 0500   BILITOT 0.3  09/22/2013 0507   GFRNONAA >60 01/20/2016 0423   GFRNONAA >60 09/24/2013 0409   GFRAA >60 01/20/2016 0423   GFRAA >60 09/24/2013 0409   PT/INR No results for input(s): LABPROT, INR in the last 72 hours.  Studies/Results: Dg Abd Acute W/chest  Result Date: 01/19/2016 CLINICAL DATA:  Ventral hernia repair January 14, 2016. Evaluate NG tube and small bowel obstruction. EXAM: DG ABDOMEN ACUTE W/ 1V CHEST COMPARISON:  CT scan January 17, 2016 FINDINGS: The right PICC line is stable. The NG tube have been pulled back with the side port in the midesophagus. Mild bibasilar opacities, likely atelectasis. Mild pulmonary venous congestion. No other interval changes. No free air portal venous gas. Prominent small bowel loops again identified, mildly dilated. No other interval changes. IMPRESSION: 1. The NG tube has been pulled back with the side port in the mid esophagus. Recommend advancement. 2. Continued small bowel obstruction. These results will be called to the ordering clinician or representative by the Radiologist Assistant, and communication documented in the PACS or zVision Dashboard. Electronically Signed   By: Gerome Samavid  Williams III M.D   On: 01/19/2016 12:34   Dg Abd Portable 1v  Result Date: 01/19/2016 CLINICAL DATA:  Nasogastric tube placement EXAM: PORTABLE ABDOMEN - 1 VIEW COMPARISON:  Portable exam 1758 hours compared  to 01/19/2016 FINDINGS: Nasogastric tube coiled in proximal stomach. Air-filled loops of large and small bowel throughout abdomen. Skin clips project over pelvis. Decreased bowel distention since previous exam. A single loop of small bowel in the mid abdomen demonstrates minimal wall thickening similar to prior study. IMPRESSION: Nasogastric tube projects over proximal stomach. Decreased small bowel distention. Electronically Signed   By: Ulyses SouthwardMark  Boles M.D.   On: 01/19/2016 18:15   Dg Abd Portable 1v  Result Date: 01/19/2016 CLINICAL DATA:  Repositioned nasogastric tube. EXAM:  PORTABLE ABDOMEN - 1 VIEW COMPARISON:  Abdominal radiograph January 18, 2006 1253 hours FINDINGS: Linear density over the thoracic spine most consistent with distal esophageal nasogastric tube. Gas-filled distended small bowel similar to prior radiograph. Laparotomy skin staples. IMPRESSION: Nasogastric tube tip projects in distal esophagus, recommend advancement. Persistent small bowel obstruction. These results will be called to the ordering clinician or representative by the Radiologist Assistant, and communication documented in the zVision Dashboard Electronically Signed   By: Awilda Metroourtnay  Bloomer M.D.   On: 01/19/2016 16:03    Assessment/Plan: 82 year old female status post repair of incarcerated ventral hernia. Clinically appears to be improved. Appreciate nutritional assistance with TPN and internal medicine assistance with her chronic medical problems. Encourage ambulation and await for full return of bowel function. Of note patient's white blood cell count has increased today. We'll follow closely and recheck CBC tomorrow.   Ricarda Frameharles Riyah Bardon, MD FACS General Surgeon  01/20/2016

## 2016-01-20 NOTE — Progress Notes (Signed)
Patient ID: [name], female   DOB: [birth year], 82 y.o.   MRN: [id number]  [facility] PROGRESS NOTE  [name] NWG:[id number] DOB: [birth year] DOA: 01/13/2016 PCP: Leotis ShamesSingh,Jasmine, MD  HPI/Subjective: Patient complaining of cough. Abdominal pain when she is coughing. Some shortness of breath. Patient has not passed gas yet.  Objective: Vitals:   01/20/16 0441 01/20/16 1300  BP: 132/66 131/69  Pulse: (!) 106 99  Resp: 18 19  Temp: 97.9 F (36.6 C) 98.1 F (36.7 C)    Filed Weights   01/18/16 0518 01/19/16 0500 01/20/16 0423  Weight: 74.8 kg (165 lb) 73.6 kg (162 lb 4.8 oz) 73.7 kg (162 lb 6.4 oz)    ROS: Review of Systems  Constitutional: Negative for chills and fever.  Eyes: Negative for blurred vision.  Respiratory: Positive for cough and wheezing. Negative for shortness of breath.   Cardiovascular: Negative for chest pain.  Gastrointestinal: Positive for abdominal pain and constipation. Negative for diarrhea, nausea and vomiting.  Genitourinary: Negative for dysuria.  Musculoskeletal: Negative for joint pain.  Neurological: Negative for dizziness and headaches.   Exam: Physical Exam  Constitutional: She is oriented to person, place, and time.  HENT:  Nose: No mucosal edema.  Mouth/Throat: No oropharyngeal exudate or posterior oropharyngeal edema.  Eyes: Conjunctivae, EOM and lids are normal. Pupils are equal, round, and reactive to light.  Neck: No JVD present. Carotid bruit is not present. No edema present. No thyroid mass and no thyromegaly present.  Cardiovascular: S1 normal and S2 normal.  Exam reveals no gallop.   No murmur heard. Pulses:      Dorsalis pedis pulses are 2+ on the right side, and 2+ on the left side.  Respiratory: No respiratory distress. She has no wheezes. She has rhonchi in the right lower field and the left lower field. She has no rales.  Upper airway wheeze  GI: Soft. Bowel sounds are normal. There is tenderness in the  suprapubic area.  Musculoskeletal:       Right ankle: She exhibits no swelling.       Left ankle: She exhibits no swelling.  Lymphadenopathy:    She has no cervical adenopathy.  Neurological: She is alert and oriented to person, place, and time. No cranial nerve deficit.  Skin: Skin is warm. No rash noted. Nails show no clubbing.  Psychiatric: She has a normal mood and affect.      Data Reviewed: Basic Metabolic Panel:  Recent Labs Lab 01/16/16 0443 01/17/16 0500 01/17/16 1823 01/18/16 0429 01/19/16 0655 01/20/16 0423  NA 137 137  --  132* 134* 135  K 3.7 3.6 3.8 3.8 4.0 3.9  CL 104 102  --  97* 100* 99*  CO2 21* 27  --  30 27 30   GLUCOSE 86 208*  --  263* 246* 216*  BUN 10 9  --  11 15 17   CREATININE 0.58 0.44  --  0.41* 0.43* 0.42*  CALCIUM 8.1* 8.1*  --  8.1* 8.3* 8.4*  MG 1.5* 1.6*  --  1.6* 1.6* 1.8  PHOS 2.0* 2.0* 2.6 2.7 2.8 2.9   Liver Function Tests:  Recent Labs Lab 01/17/16 0500  AST 15  ALT 9*  ALKPHOS 66  BILITOT 1.4*  PROT 5.9*  ALBUMIN 2.3*   CBC:  Recent Labs Lab 01/14/16 0625 01/16/16 0443 01/17/16 0500 01/18/16 0429 01/19/16 0655  WBC 9.3 15.6* 16.5* 12.6* 16.7*  HGB 12.0 11.7* 11.1* 10.2* 10.0*  HCT 36.2 34.7* 33.6* 30.4*  30.1*  MCV 92.4 92.4 93.4 92.7 92.2  PLT 273 306 300 298 321    CBG:  Recent Labs Lab 01/20/16 0022 01/20/16 0414 01/20/16 0744 01/20/16 1149 01/20/16 1632  GLUCAP 202* 211* 218* 230* 224*    Recent Results (from the past 240 hour(s))  MRSA PCR Screening     Status: None   Collection Time: 01/13/16  6:39 PM  Result Value Ref Range Status   MRSA by PCR NEGATIVE NEGATIVE Final    Comment:        The GeneXpert MRSA Assay (FDA approved for NASAL specimens only), is one component of a comprehensive MRSA colonization surveillance program. It is not intended to diagnose MRSA infection nor to guide or monitor treatment for MRSA infections.      Studies: Dg Abd Acute W/chest  Result Date:  01/19/2016 CLINICAL DATA:  Ventral hernia repair January 14, 2016. Evaluate NG tube and small bowel obstruction. EXAM: DG ABDOMEN ACUTE W/ 1V CHEST COMPARISON:  CT scan January 17, 2016 FINDINGS: The right PICC line is stable. The NG tube have been pulled back with the side port in the midesophagus. Mild bibasilar opacities, likely atelectasis. Mild pulmonary venous congestion. No other interval changes. No free air portal venous gas. Prominent small bowel loops again identified, mildly dilated. No other interval changes. IMPRESSION: 1. The NG tube has been pulled back with the side port in the mid esophagus. Recommend advancement. 2. Continued small bowel obstruction. These results will be called to the ordering clinician or representative by the Radiologist Assistant, and communication documented in the PACS or zVision Dashboard. Electronically Signed   By: Gerome Sam III M.D   On: 01/19/2016 12:34   Dg Abd Portable 1v  Result Date: 01/19/2016 CLINICAL DATA:  Nasogastric tube placement EXAM: PORTABLE ABDOMEN - 1 VIEW COMPARISON:  Portable exam 1758 hours compared to 01/19/2016 FINDINGS: Nasogastric tube coiled in proximal stomach. Air-filled loops of large and small bowel throughout abdomen. Skin clips project over pelvis. Decreased bowel distention since previous exam. A single loop of small bowel in the mid abdomen demonstrates minimal wall thickening similar to prior study. IMPRESSION: Nasogastric tube projects over proximal stomach. Decreased small bowel distention. Electronically Signed   By: Ulyses Southward M.D.   On: 01/19/2016 18:15   Dg Abd Portable 1v  Result Date: 01/19/2016 CLINICAL DATA:  Repositioned nasogastric tube. EXAM: PORTABLE ABDOMEN - 1 VIEW COMPARISON:  Abdominal radiograph January 18, 2006 1253 hours FINDINGS: Linear density over the thoracic spine most consistent with distal esophageal nasogastric tube. Gas-filled distended small bowel similar to prior radiograph. Laparotomy skin  staples. IMPRESSION: Nasogastric tube tip projects in distal esophagus, recommend advancement. Persistent small bowel obstruction. These results will be called to the ordering clinician or representative by the Radiologist Assistant, and communication documented in the zVision Dashboard Electronically Signed   By: Awilda Metro M.D.   On: 01/19/2016 16:03    Scheduled Meds: . chlorhexidine  15 mL Mouth Rinse BID  . ertapenem  1 g Intravenous Q24H  . insulin aspart  0-15 Units Subcutaneous Q4H  . insulin glargine  18 Units Subcutaneous QHS  . ipratropium-albuterol  3 mL Nebulization Q6H  . mouth rinse  15 mL Mouth Rinse q12n4p  . metoprolol tartrate  12.5 mg Oral BID  . pantoprazole (PROTONIX) IV  40 mg Intravenous Q24H   Continuous Infusions: . Marland KitchenTPN (CLINIMIX-E) Adult 70 mL/hr at 01/19/16 1810  . Marland KitchenTPN (CLINIMIX-E) Adult    . fat emulsion  Assessment/Plan:  1. Type 2 diabetes mellitus. TPN is raising the patient sugars. increased Lantus 20 units at night and sliding scale every 4 hours. 2. Essential hypertension and tachycardia. Start low-dose metoprolol twice a day since blood pressure is starting to creep up a little bit. 3. Wheeze. Start standing dose nebulizers. 4. Small bowel obstruction. NG tube repositioned yesterday and draining. Patient on empiric ertapenem. Surgery following. 5. Status post ventral hernia repair. Surgical follow-up. 6. Weakness. Physical therapy recommends rehabilitation 7. Nutrition. Patient on TPN.  Code Status:     Code Status Orders        Start     Ordered   01/13/16 1705  Full code  Continuous     01/13/16 1710    Code Status History    Date Active Date Inactive Code Status Order ID Comments User Context   This patient has a current code status but no historical code status.     Family Communication: Spoke with family yesterday Disposition Plan: patient will likely require rehabilitation. Patient will be in the hospital until she  is able to tolerate solid food and has bowel movements.  Consultants:  General surgery  Procedures:  Ventral hernia repair  Antibiotics:  Invanz  Time spent: 24 minutes  Alford HighlandWIETING, Kincaid Tiger  [facility]

## 2016-01-20 NOTE — Progress Notes (Signed)
Patient sat on edge of bed with two assist and verbal prompting.  Attempted to stand patient up with two assist.  Patient was unsteady on her feet with her knees slightly flexed.  I sat patient down and put her back to bed with assistance.  Patient is weak and would benefit from more physical therapy.  [name], [name]   01/20/2016  1:44 AM

## 2016-01-21 ENCOUNTER — Inpatient Hospital Stay: Payer: Medicare Other

## 2016-01-21 LAB — URINALYSIS, COMPLETE (UACMP) WITH MICROSCOPIC
BACTERIA UA: NONE SEEN
BILIRUBIN URINE: NEGATIVE
Glucose, UA: NEGATIVE mg/dL
Hgb urine dipstick: NEGATIVE
KETONES UR: NEGATIVE mg/dL
LEUKOCYTES UA: NEGATIVE
NITRITE: NEGATIVE
PH: 7 (ref 5.0–8.0)
PROTEIN: NEGATIVE mg/dL
Specific Gravity, Urine: 1.009 (ref 1.005–1.030)
Squamous Epithelial / LPF: NONE SEEN

## 2016-01-21 LAB — GLUCOSE, CAPILLARY
GLUCOSE-CAPILLARY: 156 mg/dL — AB (ref 65–99)
GLUCOSE-CAPILLARY: 175 mg/dL — AB (ref 65–99)
GLUCOSE-CAPILLARY: 245 mg/dL — AB (ref 65–99)
Glucose-Capillary: 185 mg/dL — ABNORMAL HIGH (ref 65–99)
Glucose-Capillary: 160 mg/dL — ABNORMAL HIGH (ref 65–99)
Glucose-Capillary: 161 mg/dL — ABNORMAL HIGH (ref 65–99)

## 2016-01-21 LAB — CBC
HCT: 28.6 % — ABNORMAL LOW (ref 35.0–47.0)
Hemoglobin: 9.5 g/dL — ABNORMAL LOW (ref 12.0–16.0)
MCH: 31.1 pg (ref 26.0–34.0)
MCHC: 33.1 g/dL (ref 32.0–36.0)
MCV: 93.8 fL (ref 80.0–100.0)
PLATELETS: 375 10*3/uL (ref 150–440)
RBC: 3.05 MIL/uL — ABNORMAL LOW (ref 3.80–5.20)
RDW: 14.4 % (ref 11.5–14.5)
WBC: 18 10*3/uL — ABNORMAL HIGH (ref 3.6–11.0)

## 2016-01-21 LAB — BASIC METABOLIC PANEL
Anion gap: 6 (ref 5–15)
BUN: 18 mg/dL (ref 6–20)
CALCIUM: 8.5 mg/dL — AB (ref 8.9–10.3)
CO2: 29 mmol/L (ref 22–32)
CREATININE: 0.49 mg/dL (ref 0.44–1.00)
Chloride: 101 mmol/L (ref 101–111)
Glucose, Bld: 172 mg/dL — ABNORMAL HIGH (ref 65–99)
Potassium: 3.8 mmol/L (ref 3.5–5.1)
SODIUM: 136 mmol/L (ref 135–145)

## 2016-01-21 LAB — PHOSPHORUS: PHOSPHORUS: 3 mg/dL (ref 2.5–4.6)

## 2016-01-21 LAB — MAGNESIUM: MAGNESIUM: 1.6 mg/dL — AB (ref 1.7–2.4)

## 2016-01-21 MED ORDER — ENOXAPARIN SODIUM 40 MG/0.4ML ~~LOC~~ SOLN
40.0000 mg | SUBCUTANEOUS | Status: DC
  Administered 2016-01-21 – 2016-01-24 (×4): 40 mg via SUBCUTANEOUS
  Filled 2016-01-21 (×4): qty 0.4

## 2016-01-21 MED ORDER — PIPERACILLIN-TAZOBACTAM 3.375 G IVPB
3.3750 g | Freq: Three times a day (TID) | INTRAVENOUS | Status: DC
  Administered 2016-01-21 – 2016-01-25 (×12): 3.375 g via INTRAVENOUS
  Filled 2016-01-21 (×13): qty 50

## 2016-01-21 MED ORDER — FAT EMULSION 20 % IV EMUL
240.0000 mL | INTRAVENOUS | Status: DC
  Administered 2016-01-21: 240 mL via INTRAVENOUS
  Filled 2016-01-21: qty 250

## 2016-01-21 MED ORDER — MAGNESIUM SULFATE 2 GM/50ML IV SOLN
2.0000 g | Freq: Once | INTRAVENOUS | Status: AC
  Administered 2016-01-21: 2 g via INTRAVENOUS
  Filled 2016-01-21: qty 50

## 2016-01-21 MED ORDER — TRACE MINERALS CR-CU-MN-SE-ZN 10-1000-500-60 MCG/ML IV SOLN
INTRAVENOUS | Status: DC
  Administered 2016-01-21: 18:00:00 via INTRAVENOUS
  Filled 2016-01-21: qty 1680

## 2016-01-21 NOTE — Progress Notes (Signed)
PHARMACY - ADULT TOTAL PARENTERAL NUTRITION CONSULT NOTE   Pharmacy Consult for TPN Indication: postop ileus  Patient Measurements: Height: 5\' 4"  (162.6 cm) Weight: 162 lb 6.4 oz (73.7 kg) IBW/kg (Calculated) : 54.7 TPN AdjBW (KG): 63.7 Body mass index is 27.88 kg/m.  Assessment: 82 yo female starting on TPN for postop ileus. Currently NPO  Endo: Current orders for SSI (moderate scale), lantus 20 units QHS . Has required 29 units of correctional insulin in the past 24 hours.  Lytes: K 3.8, Phos 3, Mg 1.6  TPN Access: PICC TPN start date: 12/6  Nutritional Goals Per dietician recommendations (see note)   Current Nutrition: NPO. Patient is currently on TPN Clinimix E 5/15 at a rate of 70 mL/hr.   Plan:  Electrolytes WNL except Mg 1.6. Gave 2 g Mg IV once. Will recheck electrolytes with AM labs tomorrow.  Continue TPN Clinimix E 5/15 at 70 mL/hr per dietician recommendations. TPN contains MVI, trace elements, and 20 units of regular insulin.   Cindi CarbonMary M Sachit Gilman, Pharm.D, BCPS Clinical Pharmacist 01/21/2016 2:23 PM

## 2016-01-21 NOTE — Progress Notes (Signed)
NG tube slipped down, readvanced checked for placement with ausculation, draining greenish colored liquid

## 2016-01-21 NOTE — Progress Notes (Signed)
Patient ID: [name], female   DOB: 03/07/1933, 82 y.o.   MRN: [id number]  [facility] PROGRESS NOTE  [name] NWG:[id number] DOB: 03/07/1933 DOA: 01/13/2016 PCP: Leotis ShamesSingh,Jasmine, MD  HPI/Subjective: Patient states she is passing gas but family doubts this. Patient was telling the story about being confused and I was unclear if it was last night or prior to her coming in. Nursing staff said she slept okay last night. Patient feels okay. Cough is much improved.  Objective: Vitals:   01/21/16 0518 01/21/16 1424  BP: (!) 146/63 127/63  Pulse: (!) 110 (!) 110  Resp: 20 20  Temp: 98.1 F (36.7 C) 98 F (36.7 C)    Filed Weights   01/18/16 0518 01/19/16 0500 01/20/16 0423  Weight: 74.8 kg (165 lb) 73.6 kg (162 lb 4.8 oz) 73.7 kg (162 lb 6.4 oz)    ROS: Review of Systems  Constitutional: Negative for chills and fever.  Eyes: Negative for blurred vision.  Respiratory: Negative for cough, shortness of breath and wheezing.   Cardiovascular: Negative for chest pain.  Gastrointestinal: Positive for abdominal pain and constipation. Negative for diarrhea, nausea and vomiting.  Genitourinary: Negative for dysuria.  Musculoskeletal: Negative for joint pain.  Neurological: Negative for dizziness and headaches.   Exam: Physical Exam  Constitutional: She is oriented to person, place, and time.  HENT:  Nose: No mucosal edema.  Mouth/Throat: No oropharyngeal exudate or posterior oropharyngeal edema.  Eyes: Conjunctivae, EOM and lids are normal. Pupils are equal, round, and reactive to light.  Neck: No JVD present. Carotid bruit is not present. No edema present. No thyroid mass and no thyromegaly present.  Cardiovascular: S1 normal and S2 normal.  Exam reveals no gallop.   No murmur heard. Pulses:      Dorsalis pedis pulses are 2+ on the right side, and 2+ on the left side.  Respiratory: No respiratory distress. She has no wheezes. She has rhonchi in the right lower field  and the left lower field. She has no rales.  Upper airway wheeze  GI: Soft. Bowel sounds are normal. There is tenderness in the suprapubic area.  Musculoskeletal:       Right ankle: She exhibits no swelling.       Left ankle: She exhibits no swelling.  Lymphadenopathy:    She has no cervical adenopathy.  Neurological: She is alert and oriented to person, place, and time. No cranial nerve deficit.  Skin: Skin is warm. No rash noted. Nails show no clubbing.  Psychiatric: She has a normal mood and affect.      Data Reviewed: Basic Metabolic Panel:  Recent Labs Lab 01/17/16 0500 01/17/16 1823 01/18/16 0429 01/19/16 0655 01/20/16 0423 01/21/16 0626  NA 137  --  132* 134* 135 136  K 3.6 3.8 3.8 4.0 3.9 3.8  CL 102  --  97* 100* 99* 101  CO2 27  --  30 27 30 29   GLUCOSE 208*  --  263* 246* 216* 172*  BUN 9  --  11 15 17 18   CREATININE 0.44  --  0.41* 0.43* 0.42* 0.49  CALCIUM 8.1*  --  8.1* 8.3* 8.4* 8.5*  MG 1.6*  --  1.6* 1.6* 1.8 1.6*  PHOS 2.0* 2.6 2.7 2.8 2.9 3.0   Liver Function Tests:  Recent Labs Lab 01/17/16 0500  AST 15  ALT 9*  ALKPHOS 66  BILITOT 1.4*  PROT 5.9*  ALBUMIN 2.3*   CBC:  Recent Labs Lab 01/16/16 0443  01/17/16 0500 01/18/16 0429 01/19/16 0655 01/21/16 0626  WBC 15.6* 16.5* 12.6* 16.7* 18.0*  HGB 11.7* 11.1* 10.2* 10.0* 9.5*  HCT 34.7* 33.6* 30.4* 30.1* 28.6*  MCV 92.4 93.4 92.7 92.2 93.8  PLT 306 300 298 321 375    CBG:  Recent Labs Lab 01/20/16 2335 01/21/16 0224 01/21/16 0509 01/21/16 0751 01/21/16 1154  GLUCAP 158* 185* 160* 156* 161*    Recent Results (from the past 240 hour(s))  MRSA PCR Screening     Status: None   Collection Time: 01/13/16  6:39 PM  Result Value Ref Range Status   MRSA by PCR NEGATIVE NEGATIVE Final    Comment:        The GeneXpert MRSA Assay (FDA approved for NASAL specimens only), is one component of a comprehensive MRSA colonization surveillance program. It is not intended to diagnose  MRSA infection nor to guide or monitor treatment for MRSA infections.      Studies: Dg Chest 2 View  Result Date: 01/21/2016 CLINICAL DATA:  Wheezing on expiration today. EXAM: CHEST  2 VIEW COMPARISON:  Single-view of the chest 01/19/2016 and 01/16/2016. CT abdomen and pelvis 01/17/2016. FINDINGS: NG tube and right PICC remain in place. There small bilateral pleural effusions with basilar atelectasis, greater on the right. No pneumothorax. Heart size is normal. Severe degenerative disease about the shoulders is noted. IMPRESSION: No marked change in small bilateral pleural effusions and basilar atelectasis, worse on the right. Electronically Signed   By: Drusilla Kanner M.D.   On: 01/21/2016 10:01   Dg Abd Portable 1v  Result Date: 01/19/2016 CLINICAL DATA:  Nasogastric tube placement EXAM: PORTABLE ABDOMEN - 1 VIEW COMPARISON:  Portable exam 1758 hours compared to 01/19/2016 FINDINGS: Nasogastric tube coiled in proximal stomach. Air-filled loops of large and small bowel throughout abdomen. Skin clips project over pelvis. Decreased bowel distention since previous exam. A single loop of small bowel in the mid abdomen demonstrates minimal wall thickening similar to prior study. IMPRESSION: Nasogastric tube projects over proximal stomach. Decreased small bowel distention. Electronically Signed   By: Ulyses Southward M.D.   On: 01/19/2016 18:15   Dg Abd Portable 1v  Result Date: 01/19/2016 CLINICAL DATA:  Repositioned nasogastric tube. EXAM: PORTABLE ABDOMEN - 1 VIEW COMPARISON:  Abdominal radiograph January 18, 2006 1253 hours FINDINGS: Linear density over the thoracic spine most consistent with distal esophageal nasogastric tube. Gas-filled distended small bowel similar to prior radiograph. Laparotomy skin staples. IMPRESSION: Nasogastric tube tip projects in distal esophagus, recommend advancement. Persistent small bowel obstruction. These results will be called to the ordering clinician or  representative by the Radiologist Assistant, and communication documented in the zVision Dashboard Electronically Signed   By: Awilda Metro M.D.   On: 01/19/2016 16:03    Scheduled Meds: . chlorhexidine  15 mL Mouth Rinse BID  . enoxaparin (LOVENOX) injection  40 mg Subcutaneous Q24H  . furosemide  20 mg Intravenous Daily  . insulin aspart  0-15 Units Subcutaneous Q4H  . insulin glargine  20 Units Subcutaneous QHS  . ipratropium-albuterol  3 mL Nebulization Q6H  . mouth rinse  15 mL Mouth Rinse q12n4p  . metoprolol tartrate  12.5 mg Oral BID  . pantoprazole (PROTONIX) IV  40 mg Intravenous Q24H  . piperacillin-tazobactam  3.375 g Intravenous Q8H   Continuous Infusions: . Marland KitchenTPN (CLINIMIX-E) Adult 70 mL/hr at 01/20/16 1736  . Marland KitchenTPN (CLINIMIX-E) Adult    . fat emulsion      Assessment/Plan:  1. Type 2  diabetes mellitus. TPN is raising the patient sugars. increased Lantus 20 units at night and sliding scale every 4 hours. Spoke with pharmacist and insulin is also in the bag of the TPN. 2. Essential hypertension and tachycardia. Start low-dose metoprolol twice a day. 3. Wheeze.  nebulizers. 4. Small improved with bowel obstruction. NG tube. Case discussed today with surgery and they will continue to watch patient at this point.  5. Status post ventral hernia repair. Surgical follow-up. 6. Weakness. Physical therapy recommends rehabilitation 7. Nutrition. Patient on TPN.  Code Status:     Code Status Orders        Start     Ordered   01/13/16 1705  Full code  Continuous     01/13/16 1710    Code Status History    Date Active Date Inactive Code Status Order ID Comments User Context   This patient has a current code status but no historical code status.     Family Communication: Spoke with family today  Disposition Plan:  Patient will be in the hospital until she is able to tolerate solid food and has bowel movements.  Consultants:  General  surgery  Procedures:  Ventral hernia repair  Antibiotics:  Zosyn   Time spent: 26 minutes  Alford HighlandWIETING, Caris Cerveny  [employer]

## 2016-01-21 NOTE — Progress Notes (Signed)
82 yr old female with strangulated incisional hernia POD#7 Incisional hernia repair and resection of small bowel.  Patient has had a postoperative ileus and has had some confusion as well.  She has had a cough with some productive sputum. She states that she has been passing some flatus because she thought she had stooled herself.  She has no had a BM.  She has worked with PT on Friday who recommended SNF placement.   Vitals:   01/20/16 2345 01/21/16 0518  BP: 131/68 (!) 146/63  Pulse: (!) 113 (!) 110  Resp:  20  Temp:  98.1 F (36.7 C)   I/O last 3 completed shifts: In: 2586 [I.V.:2586] Out: 150 [Emesis/NG output:150] Total I/O In: 283 [I.V.:203; NG/GT:30; IV Piggyback:50] Out: 100 [Emesis/NG output:100]   PE:  Gen:NAD Abd: soft, non-tender, incision c/d/i, no erythema or drainage Ext: 2+ pulses, no edema   CBC Latest Ref Rng & Units 01/21/2016 01/19/2016 01/18/2016  WBC 3.6 - 11.0 K/uL 18.0(H) 16.7(H) 12.6(H)  Hemoglobin 12.0 - 16.0 g/dL 1.6(X9.5(L) 10.0(L) 10.2(L)  Hematocrit 35.0 - 47.0 % 28.6(L) 30.1(L) 30.4(L)  Platelets 150 - 440 K/uL 375 321 298   CMP Latest Ref Rng & Units 01/21/2016 01/20/2016 01/19/2016  Glucose 65 - 99 mg/dL 096(E172(H) 454(U216(H) 981(X246(H)  BUN 6 - 20 mg/dL 18 17 15   Creatinine 0.44 - 1.00 mg/dL 9.140.49 7.82(N0.42(L) 5.62(Z0.43(L)  Sodium 135 - 145 mmol/L 136 135 134(L)  Potassium 3.5 - 5.1 mmol/L 3.8 3.9 4.0  Chloride 101 - 111 mmol/L 101 99(L) 100(L)  CO2 22 - 32 mmol/L 29 30 27   Calcium 8.9 - 10.3 mg/dL 3.0(Q8.5(L) 6.5(H8.4(L) 8.3(L)  Total Protein 6.5 - 8.1 g/dL - - -  Total Bilirubin 0.3 - 1.2 mg/dL - - -  Alkaline Phos 38 - 126 U/L - - -  AST 15 - 41 U/L - - -  ALT 14 - 54 U/L - - -   A/P: 82 yr old female with strangulated incisional hernia POD#7 Incisional hernia repair and resection of small bowel.   Pain: Dilaudid prns Wheezing/congestion: CXR today, continue guaifenesin GI: continue TPN for now, will clamp NG tube, likely d/c NG tomorrow with advancing diet  DM: continue  Lantus and SSI, appreciate medicine help GU: incontinent but with some complant to burn, UA today ID: still with increasing leucocytosis, remains a febrile,  Improving from an abdominal standpoint, will switch to Zosyn from invanz and will get UA and CXR today  Encourage ambulation: continue PT treatment.

## 2016-01-21 NOTE — Progress Notes (Signed)
Physical Therapy Treatment Patient Details Name: [name] MRN: [id number] DOB: Jun 12, 1933 Today's Date: 01/21/2016    History of Present Illness presented to ER with 5 day history of abdominal pain; admitted with incarcerated periumbilical hernia with high-grade SBO, ketoacidosis (unable to take meds due to nausea/vomiting).  Status post ventral hernia repair with small bowel resection, lysis of adhesions (12/4).  Course complicated by post-op ileus; now with NGT, TPN.    PT Comments    Participated in exercises as described below. Pt was able to significantly improve her ambulation distances this am.  She requires +2 min assist but was able to increase her distances to 100'.  No knee buckling noted but unsteady gait continues.  Limited by fatigue.   Follow Up Recommendations  SNF     Equipment Recommendations  Rolling walker with 5" wheels    Recommendations for Other Services       Precautions / Restrictions Precautions Precautions: Fall Precaution Comments: NPO, NGT Restrictions Weight Bearing Restrictions: No    Mobility  Bed Mobility Overal bed mobility: Needs Assistance Bed Mobility: Supine to Sit     Supine to sit: Mod assist        Transfers Overall transfer level: Needs assistance Equipment used: Rolling walker (2 wheeled) Transfers: Sit to/from Stand              Ambulation/Gait Ambulation/Gait assistance: Min assist;+2 physical assistance Ambulation Distance (Feet): 100 Feet Assistive device: Rolling walker (2 wheeled)     Gait velocity interpretation: <1.8 ft/sec, indicative of risk for recurrent falls General Gait Details: improved ambualtion distances.  unsteady intially then improved overall quality before decreasing with fatigue.  No buckling noted this session.  +2 assist with wheelchair follow for safety.   Stairs            Wheelchair Mobility    Modified Rankin (Stroke Patients Only)       Balance Overall  balance assessment: Needs assistance Sitting-balance support: No upper extremity supported;Feet supported Sitting balance-Leahy Scale: Fair Sitting balance - Comments: posterior trunk lean, min cuing/assist for correction   Standing balance support: Bilateral upper extremity supported Standing balance-Leahy Scale: Poor                      Cognition Arousal/Alertness: Awake/alert Behavior During Therapy: WFL for tasks assessed/performed Overall Cognitive Status: Impaired/Different from baseline Area of Impairment: Orientation;Memory Orientation Level: Place;Situation             General Comments: Son asking about cognition and "she's talking out of her head"  asking when he should expect that to clear.  Encouraged him to speak with MD.    Exercises Other Exercises Other Exercises: supine arom BLE's x 10 for ankle pumps, slr, ab/adduction and heel slides    General Comments        Pertinent Vitals/Pain Pain Assessment: Faces Faces Pain Scale: Hurts a little bit Pain Descriptors / Indicators: Aching;Guarding    Home Living                      Prior Function            PT Goals (current goals can now be found in the care plan section) Progress towards PT goals: Progressing toward goals    Frequency    Min 2X/week      PT Plan Current plan remains appropriate    Co-evaluation  End of Session Equipment Utilized During Treatment: Gait belt Activity Tolerance: Patient limited by fatigue Patient left: in chair;with call bell/phone within reach;with family/visitor present     Time: [id number] PT Time Calculation (min) (ACUTE ONLY): 23 min  Charges:  $Gait Training: 8-22 mins $Therapeutic Exercise: 8-22 mins                    G Codes:      Danielle DessSarah Hiep Ollis 01/21/2016, 1:43 PM

## 2016-01-22 LAB — PHOSPHORUS: PHOSPHORUS: 3.5 mg/dL (ref 2.5–4.6)

## 2016-01-22 LAB — BASIC METABOLIC PANEL
ANION GAP: 6 (ref 5–15)
BUN: 19 mg/dL (ref 6–20)
CALCIUM: 8.2 mg/dL — AB (ref 8.9–10.3)
CO2: 29 mmol/L (ref 22–32)
Chloride: 98 mmol/L — ABNORMAL LOW (ref 101–111)
Creatinine, Ser: 0.58 mg/dL (ref 0.44–1.00)
GLUCOSE: 204 mg/dL — AB (ref 65–99)
Potassium: 3.3 mmol/L — ABNORMAL LOW (ref 3.5–5.1)
SODIUM: 133 mmol/L — AB (ref 135–145)

## 2016-01-22 LAB — CBC
HCT: 25.5 % — ABNORMAL LOW (ref 35.0–47.0)
Hemoglobin: 8.4 g/dL — ABNORMAL LOW (ref 12.0–16.0)
MCH: 30.8 pg (ref 26.0–34.0)
MCHC: 33 g/dL (ref 32.0–36.0)
MCV: 93.3 fL (ref 80.0–100.0)
PLATELETS: 386 10*3/uL (ref 150–440)
RBC: 2.74 MIL/uL — ABNORMAL LOW (ref 3.80–5.20)
RDW: 14.8 % — AB (ref 11.5–14.5)
WBC: 17.8 10*3/uL — AB (ref 3.6–11.0)

## 2016-01-22 LAB — GLUCOSE, CAPILLARY
GLUCOSE-CAPILLARY: 136 mg/dL — AB (ref 65–99)
GLUCOSE-CAPILLARY: 168 mg/dL — AB (ref 65–99)
GLUCOSE-CAPILLARY: 232 mg/dL — AB (ref 65–99)
Glucose-Capillary: 179 mg/dL — ABNORMAL HIGH (ref 65–99)
Glucose-Capillary: 170 mg/dL — ABNORMAL HIGH (ref 65–99)
Glucose-Capillary: 186 mg/dL — ABNORMAL HIGH (ref 65–99)

## 2016-01-22 LAB — MAGNESIUM: MAGNESIUM: 1.8 mg/dL (ref 1.7–2.4)

## 2016-01-22 MED ORDER — OXYCODONE HCL 5 MG PO TABS: 5.0000 mg | ORAL_TABLET | ORAL | Status: DC | PRN

## 2016-01-22 MED ORDER — INSULIN ASPART 100 UNIT/ML ~~LOC~~ SOLN: 0.0000 [IU] | Freq: Every day | SUBCUTANEOUS | Status: DC

## 2016-01-22 MED ORDER — ACETAMINOPHEN 325 MG PO TABS
650.0000 mg | ORAL_TABLET | Freq: Four times a day (QID) | ORAL | Status: DC
  Administered 2016-01-22 – 2016-01-25 (×10): 650 mg via ORAL
  Filled 2016-01-22 (×10): qty 2

## 2016-01-22 MED ORDER — SODIUM CHLORIDE 0.9 % IV SOLN
30.0000 meq | Freq: Once | INTRAVENOUS | Status: AC
  Administered 2016-01-22: 30 meq via INTRAVENOUS
  Filled 2016-01-22: qty 15

## 2016-01-22 MED ORDER — PHENOL 1.4 % MT LIQD
1.0000 | OROMUCOSAL | Status: DC | PRN
  Administered 2016-01-22: 1 via OROMUCOSAL
  Filled 2016-01-22: qty 177

## 2016-01-22 MED ORDER — PANTOPRAZOLE SODIUM 40 MG PO TBEC
40.0000 mg | DELAYED_RELEASE_TABLET | Freq: Every day | ORAL | Status: DC
  Administered 2016-01-22 – 2016-01-25 (×4): 40 mg via ORAL
  Filled 2016-01-22 (×4): qty 1

## 2016-01-22 MED ORDER — INSULIN ASPART 100 UNIT/ML ~~LOC~~ SOLN
0.0000 [IU] | Freq: Three times a day (TID) | SUBCUTANEOUS | Status: DC
  Administered 2016-01-22: 2 [IU] via SUBCUTANEOUS
  Administered 2016-01-23: 3 [IU] via SUBCUTANEOUS
  Administered 2016-01-23: 2 [IU] via SUBCUTANEOUS
  Administered 2016-01-24 (×3): 1 [IU] via SUBCUTANEOUS
  Administered 2016-01-25: 2 [IU] via SUBCUTANEOUS
  Filled 2016-01-22: qty 2
  Filled 2016-01-22 (×2): qty 1
  Filled 2016-01-22: qty 2
  Filled 2016-01-22: qty 3

## 2016-01-22 MED ORDER — IPRATROPIUM-ALBUTEROL 0.5-2.5 (3) MG/3ML IN SOLN: 3.0000 mL | Freq: Four times a day (QID) | RESPIRATORY_TRACT | Status: DC | PRN

## 2016-01-22 NOTE — Progress Notes (Signed)
Nutrition Follow-up  DOCUMENTATION CODES:   Non-severe (moderate) malnutrition in context of acute illness/injury  INTERVENTION:  1. Continue TPN 5/15 @ 70, 20% lipids @ 20 for 12 hours. Pt NGT clamped, no longer to suction. Consider titrating to half tomorrow if patient can be advanced to clears today and tolerates  NUTRITION DIAGNOSIS:   Malnutrition related to acute illness as evidenced by mild depletion of muscle mass, percent weight loss, energy intake < 75% for > 7 days. -resolving  GOAL:   Patient will meet greater than or equal to 90% of their needs -meeting with TPN  MONITOR:   Diet advancement, Labs, Weight trends  REASON FOR ASSESSMENT:   Malnutrition Screening Tool    ASSESSMENT:    82 yo female admitted with SBO due to strangulated ventral hernia s/p lysis of adhesions with SB resection with anastamosis. Pt also in DKA and ARF on admission. Pt with hx of DM, HTN, HL, diverticulitis  Patient continues to tolerate TPN NGT clamped, complains of some mild nausea - but no issues otherwise. Claims she had a BM today. Wt stable Labs and medications reviewed: Na 133, K 3.3, CBGs 170-186  Diet Order:  Diet NPO time specified .TPN (CLINIMIX-E) Adult  Skin:  Reviewed, no issues  Last BM:  12/12  Height:   Ht Readings from Last 1 Encounters:  01/13/16 5\' 4"  (1.626 m)    Weight:   Wt Readings from Last 1 Encounters:  01/22/16 153 lb (69.4 kg)    Ideal Body Weight:     BMI:  Body mass index is 26.26 kg/m.  Estimated Nutritional Needs:   Kcal:  1700-1900 kcals  Protein:  85-95 g  Fluid:  >/= 1.7 L  EDUCATION NEEDS:   No education needs identified at this time  Dionne AnoWilliam M. Nashae Maudlin, MS, RD LDN Inpatient Clinical Dietitian Pager [phone]

## 2016-01-22 NOTE — Progress Notes (Signed)
Pt alert and sitting in recliner. Son in room. Pt has two children. 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 [facility] offered prayer.   01/22/16 1150  Clinical Encounter Type  Visited With Patient and family together  Visit Type Initial  Referral From Nurse  Spiritual Encounters  Spiritual Needs Prayer;Emotional  Stress Factors  Patient Stress Factors None identified  Family Stress Factors None identified

## 2016-01-22 NOTE — Progress Notes (Signed)
Physical Therapy Treatment Patient Details Name: [name] MRN: [id number] DOB: 1933-08-21 Today's Date: 01/22/2016    History of Present Illness presented to ER with 5 day history of abdominal pain; admitted with incarcerated periumbilical hernia with high-grade SBO, ketoacidosis (unable to take meds due to nausea/vomiting).  Status post ventral hernia repair with small bowel resection, lysis of adhesions (12/4).  Course complicated by post-op ileus; now with NGT, TPN.    PT Comments    Pt up in chair, agreeable to PT; wishes to try and get up. Pt demonstrates very retropulsive posture in both sit and stand. Requires heavy cueing for technique, sequence and to keep feet on the floor for improved balance with sit. Several stands requiring Mod A of 1 and Min guard of another for safety. Pt unable to correct posture with tactile and verbal cueing. Pt ultimately returned to chair. Fatigues quickly. Continue PT to progress strength, endurance and balance to improve functional mobility. Nursing assistant called to inform there is no chair alarm; assistant will remedy.   Follow Up Recommendations  SNF     Equipment Recommendations       Recommendations for Other Services       Precautions / Restrictions Precautions Precautions: Fall Restrictions Weight Bearing Restrictions: No    Mobility  Bed Mobility               General bed mobility comments: Not tested; up in chair  Transfers Overall transfer level: Needs assistance Equipment used: Rolling walker (2 wheeled) Transfers: Sit to/from Stand Sit to Stand: Mod assist;+2 safety/equipment         General transfer comment: Cues for safe hand placement and to keep feet on the floor. Retropulsive in sit before attempted stand.   Ambulation/Gait             General Gait Details: Unable due to retropulsive static stand and unable to correct   Stairs            Wheelchair Mobility    Modified Rankin  (Stroke Patients Only)       Balance Overall balance assessment: Needs assistance Sitting-balance support: Feet supported;No upper extremity supported;Bilateral upper extremity supported;Feet unsupported Sitting balance-Leahy Scale: Fair Sitting balance - Comments: retropulsive Postural control: Posterior lean Standing balance support: Bilateral upper extremity supported Standing balance-Leahy Scale: Poor Standing balance comment: Retropulsive; Mod A also leans against chair; worsens with step away from chair                    Cognition Arousal/Alertness: Awake/alert Behavior During Therapy: WFL for tasks assessed/performed Overall Cognitive Status: Within Functional Limits for tasks assessed                      Exercises      General Comments        Pertinent Vitals/Pain Pain Assessment: No/denies pain    Home Living                      Prior Function            PT Goals (current goals can now be found in the care plan section) Progress towards PT goals: Progressing toward goals    Frequency    Min 2X/week      PT Plan Current plan remains appropriate    Co-evaluation             End of Session Equipment Utilized During Treatment: Gait belt  Activity Tolerance: Patient limited by fatigue;Other (comment) (poor balance) Patient left: in chair;with call bell/phone within reach;with family/visitor present (no chair alarm in room; called nsg asst to remedy)     Time: [id number] PT Time Calculation (min) (ACUTE ONLY): 17 min  Charges:  $Therapeutic Activity: 8-22 mins                    G CodesScot Dock:      [name], PTA 01/22/2016, 3:33 PM

## 2016-01-22 NOTE — Progress Notes (Signed)
82 yr old female with strangulated incisional hernia POD#8 Incisional hernia repair and resection of small bowel.  Patient has had a postoperative ileus and has had some confusion as well.  She has had a cough with some productive sputum but states this is better.  She is confused on whether she has passed gas but did have a BM recorded that may have had some with it.  She denies any significant pain.   Vitals:   01/22/16 0958 01/22/16 1440  BP: (!) 122/58 (!) 116/59  Pulse: 100 94  Resp: 16 18  Temp: 98.5 F (36.9 C) 98 F (36.7 C)   I/O last 3 completed shifts: In: 3076 [I.V.:2916; NG/GT:60; IV Piggyback:100] Out: 255 [Urine:5; Emesis/NG output:250] Total I/O In: 315 [IV Piggyback:315] Out: 0    PE:  Gen:NAD Abd: soft, non-tender, incision c/d/i, no erythema or drainage Ext: 2+ pulses, no edema   CBC Latest Ref Rng & Units 01/22/2016 01/21/2016 01/19/2016  WBC 3.6 - 11.0 K/uL 17.8(H) 18.0(H) 16.7(H)  Hemoglobin 12.0 - 16.0 g/dL 1.6(X8.4(L) 0.9(U9.5(L) 10.0(L)  Hematocrit 35.0 - 47.0 % 25.5(L) 28.6(L) 30.1(L)  Platelets 150 - 440 K/uL 386 375 321   CMP Latest Ref Rng & Units 01/22/2016 01/21/2016 01/20/2016  Glucose 65 - 99 mg/dL 045(W204(H) 098(J172(H) 191(Y216(H)  BUN 6 - 20 mg/dL 19 18 17   Creatinine 0.44 - 1.00 mg/dL 7.820.58 9.560.49 2.13(Y0.42(L)  Sodium 135 - 145 mmol/L 133(L) 136 135  Potassium 3.5 - 5.1 mmol/L 3.3(L) 3.8 3.9  Chloride 101 - 111 mmol/L 98(L) 101 99(L)  CO2 22 - 32 mmol/L 29 29 30   Calcium 8.9 - 10.3 mg/dL 8.2(L) 8.5(L) 8.4(L)  Total Protein 6.5 - 8.1 g/dL - - -  Total Bilirubin 0.3 - 1.2 mg/dL - - -  Alkaline Phos 38 - 126 U/L - - -  AST 15 - 41 U/L - - -  ALT 14 - 54 U/L - - -   A/P: 82 yr old female with strangulated incisional hernia POD#8 Incisional hernia repair and resection of small bowel.   Pain: Tylenol scheduled, oxycodone and Dilaudid prns Wheezing/congestion: continue guafenisen and pulm toilet GI: NG tube d/c'd today, clear liquids started, TPN Dc'd DM: continue  regular and SSI, appreciate medicine help ID: continue Zosyn for peritonitis, wbc decreased to 17 today, likely from lungs, will continue to assess  Encourage ambulation: continue PT treatment.

## 2016-01-22 NOTE — Clinical Social Work Note (Signed)
CSW provided patient's brother with bed offers yesterday afternoon in the event patient and family decide upon rehab. York SpanielMonica Draydon [place]airmont MSW,LCSW [phone]

## 2016-01-22 NOTE — Progress Notes (Signed)
Patient ID: [name] CreedHallie G Barg, female   DOB: 1934/01/17, 82 y.o.   MRN: [id number]  [facility] PROGRESS NOTE  [name] CreedHallie G Pischke [id number] DOB: 1934/01/17 DOA: 01/13/2016 PCP: Leotis ShamesSingh,Jasmine, MD  HPI/Subjective: Patient had a documented bowel movement. She feels okay. Only some abdominal pain of coughs.  Objective: Vitals:   01/22/16 0958 01/22/16 1440  BP: (!) 122/58 (!) 116/59  Pulse: 100 94  Resp: 16 18  Temp: 98.5 F (36.9 C) 98 F (36.7 C)    Filed Weights   01/20/16 0423 01/21/16 1700 01/22/16 0500  Weight: 73.7 kg (162 lb 6.4 oz) 72.5 kg (159 lb 12.8 oz) 69.4 kg (153 lb)    ROS: Review of Systems  Constitutional: Negative for chills and fever.  Eyes: Negative for blurred vision.  Respiratory: Negative for cough, shortness of breath and wheezing.   Cardiovascular: Negative for chest pain.  Gastrointestinal: Positive for abdominal pain. Negative for diarrhea, nausea and vomiting.  Genitourinary: Negative for dysuria.  Musculoskeletal: Negative for joint pain.  Neurological: Negative for dizziness and headaches.   Exam: Physical Exam  Constitutional: She is oriented to person, place, and time.  HENT:  Nose: No mucosal edema.  Mouth/Throat: No oropharyngeal exudate or posterior oropharyngeal edema.  Eyes: Conjunctivae, EOM and lids are normal. Pupils are equal, round, and reactive to light.  Neck: No JVD present. Carotid bruit is not present. No edema present. No thyroid mass and no thyromegaly present.  Cardiovascular: S1 normal and S2 normal.  Exam reveals no gallop.   No murmur heard. Pulses:      Dorsalis pedis pulses are 2+ on the right side, and 2+ on the left side.  Respiratory: No respiratory distress. She has no wheezes. She has no rhonchi. She has no rales.  GI: Soft. Bowel sounds are normal. There is tenderness in the suprapubic area.  Musculoskeletal:       Right ankle: She exhibits no swelling.       Left ankle: She exhibits no swelling.   Lymphadenopathy:    She has no cervical adenopathy.  Neurological: She is alert and oriented to person, place, and time. No cranial nerve deficit.  Skin: Skin is warm. No rash noted. Nails show no clubbing.  Psychiatric: She has a normal mood and affect.      Data Reviewed: Basic Metabolic Panel:  Recent Labs Lab 01/18/16 0429 01/19/16 0655 01/20/16 0423 01/21/16 0626 01/22/16 0550  NA 132* 134* 135 136 133*  K 3.8 4.0 3.9 3.8 3.3*  CL 97* 100* 99* 101 98*  CO2 30 27 30 29 29   GLUCOSE 263* 246* 216* 172* 204*  BUN 11 15 17 18 19   CREATININE 0.41* 0.43* 0.42* 0.49 0.58  CALCIUM 8.1* 8.3* 8.4* 8.5* 8.2*  MG 1.6* 1.6* 1.8 1.6* 1.8  PHOS 2.7 2.8 2.9 3.0 3.5   Liver Function Tests:  Recent Labs Lab 01/17/16 0500  AST 15  ALT 9*  ALKPHOS 66  BILITOT 1.4*  PROT 5.9*  ALBUMIN 2.3*   CBC:  Recent Labs Lab 01/17/16 0500 01/18/16 0429 01/19/16 0655 01/21/16 0626 01/22/16 0550  WBC 16.5* 12.6* 16.7* 18.0* 17.8*  HGB 11.1* 10.2* 10.0* 9.5* 8.4*  HCT 33.6* 30.4* 30.1* 28.6* 25.5*  MCV 93.4 92.7 92.2 93.8 93.3  PLT 300 298 321 375 386    CBG:  Recent Labs Lab 01/21/16 2016 01/22/16 0014 01/22/16 0438 01/22/16 0746 01/22/16 1128  GLUCAP 245* 232* 179* 170* 186*    Recent Results (from the past 240  hour(s))  MRSA PCR Screening     Status: None   Collection Time: 01/13/16  6:39 PM  Result Value Ref Range Status   MRSA by PCR NEGATIVE NEGATIVE Final    Comment:        The GeneXpert MRSA Assay (FDA approved for NASAL specimens only), is one component of a comprehensive MRSA colonization surveillance program. It is not intended to diagnose MRSA infection nor to guide or monitor treatment for MRSA infections.      Studies: Dg Chest 2 View  Result Date: 01/21/2016 CLINICAL DATA:  Wheezing on expiration today. EXAM: CHEST  2 VIEW COMPARISON:  Single-view of the chest 01/19/2016 and 01/16/2016. CT abdomen and pelvis 01/17/2016. FINDINGS: NG tube  and right PICC remain in place. There small bilateral pleural effusions with basilar atelectasis, greater on the right. No pneumothorax. Heart size is normal. Severe degenerative disease about the shoulders is noted. IMPRESSION: No marked change in small bilateral pleural effusions and basilar atelectasis, worse on the right. Electronically Signed   By: Drusilla Kannerhomas  Dalessio M.D.   On: 01/21/2016 10:01    Scheduled Meds: . chlorhexidine  15 mL Mouth Rinse BID  . enoxaparin (LOVENOX) injection  40 mg Subcutaneous Q24H  . furosemide  20 mg Intravenous Daily  . insulin aspart  0-5 Units Subcutaneous QHS  . insulin aspart  0-9 Units Subcutaneous TID WC  . ipratropium-albuterol  3 mL Nebulization Q6H  . mouth rinse  15 mL Mouth Rinse q12n4p  . metoprolol tartrate  12.5 mg Oral BID  . pantoprazole (PROTONIX) IV  40 mg Intravenous Q24H  . piperacillin-tazobactam  3.375 g Intravenous Q8H  . potassium chloride (KCL MULTIRUN) 30 mEq in 265 mL IVPB  30 mEq Intravenous Once   Assessment/Plan:  1. Type 2 diabetes mellitus. With stopping TPN today I will stop the Lantus insulin tonight. Sliding scale only. 2. Essential hypertension and tachycardia. Continue low-dose metoprolol twice a day. 3. Wheeze improved with nebulizers. 4. Small bowel obstruction. NG tube removed today. Clear liquid diet started 5. Status post ventral hernia repair. Surgical follow-up. 6. Weakness. Patient would like to go home with her daughter for strengthening 7. Nutrition. Surgery to start on clear liquid diet today 8. Hypokalemia replace potassium IV  Code Status:     Code Status Orders        Start     Ordered   01/13/16 1705  Full code  Continuous     01/13/16 1710    Code Status History    Date Active Date Inactive Code Status Order ID Comments User Context   This patient has a current code status but no historical code status.     Family Communication: Spoke with family today  Disposition Plan:  Hopefully we'll  be able to get out of the hospital within the next few days  Consultants:  General surgery  Procedures:  Ventral hernia repair  Antibiotics:  Zosyn   Time spent: 24 minutes  Alford HighlandWIETING, Keandre Linden  [employer]

## 2016-01-23 LAB — CBC
HEMATOCRIT: 25.3 % — AB (ref 35.0–47.0)
Hemoglobin: 8.3 g/dL — ABNORMAL LOW (ref 12.0–16.0)
MCH: 30.9 pg (ref 26.0–34.0)
MCHC: 32.9 g/dL (ref 32.0–36.0)
MCV: 94.1 fL (ref 80.0–100.0)
Platelets: 403 10*3/uL (ref 150–440)
RBC: 2.69 MIL/uL — ABNORMAL LOW (ref 3.80–5.20)
RDW: 14.5 % (ref 11.5–14.5)
WBC: 14.3 10*3/uL — AB (ref 3.6–11.0)

## 2016-01-23 LAB — MAGNESIUM: Magnesium: 1.6 mg/dL — ABNORMAL LOW (ref 1.7–2.4)

## 2016-01-23 LAB — BASIC METABOLIC PANEL
ANION GAP: 6 (ref 5–15)
BUN: 18 mg/dL (ref 6–20)
CO2: 30 mmol/L (ref 22–32)
Calcium: 8.4 mg/dL — ABNORMAL LOW (ref 8.9–10.3)
Chloride: 102 mmol/L (ref 101–111)
Creatinine, Ser: 0.54 mg/dL (ref 0.44–1.00)
Glucose, Bld: 111 mg/dL — ABNORMAL HIGH (ref 65–99)
POTASSIUM: 3.8 mmol/L (ref 3.5–5.1)
SODIUM: 138 mmol/L (ref 135–145)

## 2016-01-23 LAB — GLUCOSE, CAPILLARY
GLUCOSE-CAPILLARY: 219 mg/dL — AB (ref 65–99)
GLUCOSE-CAPILLARY: 164 mg/dL — AB (ref 65–99)
Glucose-Capillary: 114 mg/dL — ABNORMAL HIGH (ref 65–99)
Glucose-Capillary: 195 mg/dL — ABNORMAL HIGH (ref 65–99)

## 2016-01-23 LAB — PHOSPHORUS: PHOSPHORUS: 3.6 mg/dL (ref 2.5–4.6)

## 2016-01-23 MED ORDER — GLIMEPIRIDE 2 MG PO TABS
1.0000 mg | ORAL_TABLET | Freq: Every day | ORAL | Status: DC
  Administered 2016-01-24 – 2016-01-25 (×2): 1 mg via ORAL
  Filled 2016-01-23 (×2): qty 1

## 2016-01-23 MED ORDER — MAGNESIUM SULFATE 2 GM/50ML IV SOLN
2.0000 g | Freq: Once | INTRAVENOUS | Status: AC
  Administered 2016-01-23: 2 g via INTRAVENOUS
  Filled 2016-01-23: qty 50

## 2016-01-23 MED ORDER — BOOST / RESOURCE BREEZE PO LIQD
1.0000 | Freq: Three times a day (TID) | ORAL | Status: DC
  Administered 2016-01-23 – 2016-01-24 (×4): 1 via ORAL

## 2016-01-23 NOTE — Progress Notes (Signed)
Nutrition Follow-up  DOCUMENTATION CODES:   Non-severe (moderate) malnutrition in context of acute illness/injury  INTERVENTION:  1. Diet advancement per MD/NP/PA 2. Boost Breeze po TID, each supplement provides 250 kcal and 9 grams of protein  NUTRITION DIAGNOSIS:   Malnutrition related to acute illness as evidenced by mild depletion of muscle mass, percent weight loss, energy intake < 75% for > 7 days. -resolving  GOAL:   Patient will meet greater than or equal to 90% of their needs -progressing  MONITOR:   Diet advancement, Labs, Weight trends  REASON FOR ASSESSMENT:   Malnutrition Screening Tool    ASSESSMENT:    82 yo female admitted with SBO due to strangulated ventral hernia s/p lysis of adhesions with SB resection with anastamosis. Pt also in DKA and ARF on admission. Pt with hx of DM, HTN, HL, diverticulitis  [name] is doing well today - in good spirits, had jello, grape juice, chicken broth this morning.  Documented meal completion 100% No complaints, no nausea, passing flatus, no BM today. Labs and medications reviewed: CBGs 136-219, Mg 1.6  Diet Order:  Diet clear liquid Room service appropriate? Yes; Fluid consistency: Thin  Skin:  Reviewed, no issues  Last BM:  12/12  Height:   Ht Readings from Last 1 Encounters:  01/13/16 5\' 4"  (1.626 m)    Weight:   Wt Readings from Last 1 Encounters:  01/22/16 153 lb (69.4 kg)    Ideal Body Weight:     BMI:  Body mass index is 26.26 kg/m.  Estimated Nutritional Needs:   Kcal:  1700-1900 kcals  Protein:  85-95 g  Fluid:  >/= 1.7 L  EDUCATION NEEDS:   No education needs identified at this time  [name] AnoWilliam M. Janaiah Vetrano, MS, RD LDN Inpatient Clinical Dietitian Pager [phone]

## 2016-01-23 NOTE — Progress Notes (Signed)
Patient ID: [name], female   DOB: November 24, 1933, 82 y.o.   MRN: [id number]  [facility] PROGRESS NOTE  [name] FAO:[id number] DOB: November 24, 1933 DOA: 01/13/2016 PCP: Leotis ShamesSingh,Jasmine, MD  HPI/Subjective: Patient feels good offers no complaints. Some abdominal pain when coughs. Tolerating liquid diet. Had bowel movements yesterday.  Objective: Vitals:   01/23/16 0900 01/23/16 1258  BP: (!) 116/57 (!) 113/56  Pulse: 85 89  Resp:  16  Temp:  97.5 F (36.4 C)    Filed Weights   01/20/16 0423 01/21/16 1700 01/22/16 0500  Weight: 73.7 kg (162 lb 6.4 oz) 72.5 kg (159 lb 12.8 oz) 69.4 kg (153 lb)    ROS: Review of Systems  Constitutional: Negative for chills and fever.  Eyes: Negative for blurred vision.  Respiratory: Negative for cough, shortness of breath and wheezing.   Cardiovascular: Negative for chest pain.  Gastrointestinal: Positive for abdominal pain. Negative for diarrhea, nausea and vomiting.  Genitourinary: Negative for dysuria.  Musculoskeletal: Negative for joint pain.  Neurological: Negative for dizziness and headaches.   Exam: Physical Exam  Constitutional: She is oriented to person, place, and time.  HENT:  Nose: No mucosal edema.  Mouth/Throat: No oropharyngeal exudate or posterior oropharyngeal edema.  Eyes: Conjunctivae, EOM and lids are normal. Pupils are equal, round, and reactive to light.  Neck: No JVD present. Carotid bruit is not present. No edema present. No thyroid mass and no thyromegaly present.  Cardiovascular: S1 normal and S2 normal.  Exam reveals no gallop.   No murmur heard. Pulses:      Dorsalis pedis pulses are 2+ on the right side, and 2+ on the left side.  Respiratory: No respiratory distress. She has no wheezes. She has no rhonchi. She has no rales.  GI: Soft. Bowel sounds are normal. There is tenderness in the suprapubic area.  Musculoskeletal:       Right ankle: She exhibits no swelling.       Left ankle: She exhibits  no swelling.  Lymphadenopathy:    She has no cervical adenopathy.  Neurological: She is alert and oriented to person, place, and time. No cranial nerve deficit.  Skin: Skin is warm. No rash noted. Nails show no clubbing.  Psychiatric: She has a normal mood and affect.      Data Reviewed: Basic Metabolic Panel:  Recent Labs Lab 01/19/16 0655 01/20/16 0423 01/21/16 0626 01/22/16 0550 01/23/16 0515  NA 134* 135 136 133* 138  K 4.0 3.9 3.8 3.3* 3.8  CL 100* 99* 101 98* 102  CO2 27 30 29 29 30   GLUCOSE 246* 216* 172* 204* 111*  BUN 15 17 18 19 18   CREATININE 0.43* 0.42* 0.49 0.58 0.54  CALCIUM 8.3* 8.4* 8.5* 8.2* 8.4*  MG 1.6* 1.8 1.6* 1.8 1.6*  PHOS 2.8 2.9 3.0 3.5 3.6   Liver Function Tests:  Recent Labs Lab 01/17/16 0500  AST 15  ALT 9*  ALKPHOS 66  BILITOT 1.4*  PROT 5.9*  ALBUMIN 2.3*   CBC:  Recent Labs Lab 01/18/16 0429 01/19/16 0655 01/21/16 0626 01/22/16 0550 01/23/16 0515  WBC 12.6* 16.7* 18.0* 17.8* 14.3*  HGB 10.2* 10.0* 9.5* 8.4* 8.3*  HCT 30.4* 30.1* 28.6* 25.5* 25.3*  MCV 92.7 92.2 93.8 93.3 94.1  PLT 298 321 375 386 403    CBG:  Recent Labs Lab 01/22/16 1654 01/22/16 2212 01/23/16 0717 01/23/16 1122 01/23/16 1637  GLUCAP 168* 136* 114* 219* 195*    Recent Results (from the past 240  hour(s))  MRSA PCR Screening     Status: None   Collection Time: 01/13/16  6:39 PM  Result Value Ref Range Status   MRSA by PCR NEGATIVE NEGATIVE Final    Comment:        The GeneXpert MRSA Assay (FDA approved for NASAL specimens only), is one component of a comprehensive MRSA colonization surveillance program. It is not intended to diagnose MRSA infection nor to guide or monitor treatment for MRSA infections.      Scheduled Meds: . acetaminophen  650 mg Oral Q6H  . chlorhexidine  15 mL Mouth Rinse BID  . enoxaparin (LOVENOX) injection  40 mg Subcutaneous Q24H  . feeding supplement  1 Container Oral TID BM  . furosemide  20 mg  Intravenous Daily  . insulin aspart  0-5 Units Subcutaneous QHS  . insulin aspart  0-9 Units Subcutaneous TID WC  . mouth rinse  15 mL Mouth Rinse q12n4p  . metoprolol tartrate  12.5 mg Oral BID  . pantoprazole  40 mg Oral Daily  . piperacillin-tazobactam  3.375 g Intravenous Q8H   Assessment/Plan:  1. Type 2 diabetes mellitus.  Can restart low-dose Amaryl tomorrow morning 1 mg. Continue sliding scale. 2. Essential hypertension and tachycardia. Continue low-dose metoprolol twice a day. 3. Wheeze improved with nebulizers. 4. Small bowel obstruction. Tolerating clear liquid diet. Diet as per surgery. 5. Status post ventral hernia repair. Surgical follow-up. 6. Weakness. Patient would like to go home with her daughter for strengthening 7. Nutrition. Surgery to start on clear liquid diet today 8. Hypokalemia replaced 9. Hypomagnesemia- replace magnesium today   Code Status:     Code Status Orders        Start     Ordered   01/13/16 1705  Full code  Continuous     01/13/16 1710    Code Status History    Date Active Date Inactive Code Status Order ID Comments User Context   This patient has a current code status but no historical code status.     Family Communication: Spoke with family today  Disposition Plan:  Likely will go home soon.  Consultants:  General surgery  Procedures:  Ventral hernia repair  Antibiotics:  Zosyn   Time spent: 22 minutes  Alford HighlandWIETING, Ayeshia Coppin  [facility]  Medicine team will sign off today. If any questions can call Dr. Juliene PinaMody tomorrow or Friday.

## 2016-01-23 NOTE — Progress Notes (Signed)
82 yr old female with strangulated incisional hernia POD#9 Incisional hernia repair and resection of small bowel.  Patient doing well today.  She has tolerated a clear liquid diet and been passing flatus and had BMs.  She has been working with physcial therapy as well.   Vitals:   01/23/16 0900 01/23/16 1258  BP: (!) 116/57 (!) 113/56  Pulse: 85 89  Resp:  16  Temp:  97.5 F (36.4 C)   I/O last 3 completed shifts: In: 1335 [I.V.:1020; IV Piggyback:315] Out: 5 [Urine:5] Total I/O In: 1500 [P.O.:1500] Out: -    PE:  Gen:NAD Abd: soft, non-tender, incision c/d/i, no erythema or drainage Ext: 2+ pulses, no edema   CBC Latest Ref Rng & Units 01/23/2016 01/22/2016 01/21/2016  WBC 3.6 - 11.0 K/uL 14.3(H) 17.8(H) 18.0(H)  Hemoglobin 12.0 - 16.0 g/dL 8.3(L) 8.4(L) 9.5(L)  Hematocrit 35.0 - 47.0 % 25.3(L) 25.5(L) 28.6(L)  Platelets 150 - 440 K/uL 403 386 375   CMP Latest Ref Rng & Units 01/23/2016 01/22/2016 01/21/2016  Glucose 65 - 99 mg/dL 409(W111(H) 119(J204(H) 478(G172(H)  BUN 6 - 20 mg/dL 18 19 18   Creatinine 0.44 - 1.00 mg/dL 9.560.54 2.130.58 0.860.49  Sodium 135 - 145 mmol/L 138 133(L) 136  Potassium 3.5 - 5.1 mmol/L 3.8 3.3(L) 3.8  Chloride 101 - 111 mmol/L 102 98(L) 101  CO2 22 - 32 mmol/L 30 29 29   Calcium 8.9 - 10.3 mg/dL 5.7(Q8.4(L) 4.6(N8.2(L) 6.2(X8.5(L)  Total Protein 6.5 - 8.1 g/dL - - -  Total Bilirubin 0.3 - 1.2 mg/dL - - -  Alkaline Phos 38 - 126 U/L - - -  AST 15 - 41 U/L - - -  ALT 14 - 54 U/L - - -   A/P: 82 yr old female with strangulated incisional hernia POD#9 Incisional hernia repair and resection of small bowel.   Pain: Tylenol scheduled, oxycodone and Dilaudid prns Wheezing/congestion: continue guafenisen and pulm toilet GI: advance to soft diet today DM: continue regular and SSI, appreciate medicine help ID: continue Zosyn for peritonitis, wbc down to 14 today Encourage ambulation: continue PT treatment, likely SNF placement on Friday if does well with diet

## 2016-01-23 NOTE — Progress Notes (Signed)
Physical Therapy Treatment Patient Details Name: [name] MRN: [id number] DOB: 1933-03-10 Today's Date: 01/23/2016    History of Present Illness presented to ER with 5 day history of abdominal pain; admitted with incarcerated periumbilical hernia with high-grade SBO, ketoacidosis (unable to take meds due to nausea/vomiting).  Status post ventral hernia repair with small bowel resection, lysis of adhesions (12/4).  Course complicated by post-op ileus; now with NGT, TPN.    PT Comments    Pt agreeable to PT and wishes to try and use the bedside commode and get up in the chair. Pt continues to require moderate assist of 1-2 people due to posterior lean in both sit and stand; however, with cues pt better able to correct and maintain position in sit and with physical assist better able to achieve in stand today. Pt only able to tolerate several unsteady steps, which is improvement from last session. Continue PT to progress strength, endurance, balance and stand tolerance to improve functional mobility.   Follow Up Recommendations  SNF     Equipment Recommendations  Rolling walker with 5" wheels    Recommendations for Other Services       Precautions / Restrictions Precautions Precautions: Fall Restrictions Weight Bearing Restrictions: No    Mobility  Bed Mobility Overal bed mobility: Needs Assistance Bed Mobility: Supine to Sit     Supine to sit: Mod assist     General bed mobility comments: Assist for trunk  Transfers Overall transfer level: Needs assistance Equipment used: Rolling walker (2 wheeled) Transfers: Sit to/from Stand Sit to Stand: Mod assist;+2 safety/equipment         General transfer comment: Improved leaning forward for stand; retropulsive with stand. Performed 3x  Ambulation/Gait Ambulation/Gait assistance: Min assist;+2 physical assistance (Mod A if 1) Ambulation Distance (Feet): 3 Feet ([facility] Assistive device: Rolling walker (2  wheeled) Gait Pattern/deviations: Step-to pattern;Trunk flexed Gait velocity: slow Gait velocity interpretation: <1.8 ft/sec, indicative of risk for recurrent falls General Gait Details: Small unsteady steps; assist for correcting posture/COG and to steady   Careers information officertairs            Wheelchair Mobility    Modified Rankin (Stroke Patients Only)       Balance   Sitting-balance support: Feet supported;Bilateral upper extremity supported Sitting balance-Leahy Scale: Fair     Standing balance support: Bilateral upper extremity supported Standing balance-Leahy Scale: Poor                      Cognition Arousal/Alertness: Awake/alert Behavior During Therapy: WFL for tasks assessed/performed Overall Cognitive Status: Within Functional Limits for tasks assessed                      Exercises      General Comments        Pertinent Vitals/Pain Pain Assessment: No/denies pain (only in abdomen with cough)    Home Living                      Prior Function            PT Goals (current goals can now be found in the care plan section) Progress towards PT goals: Progressing toward goals    Frequency    Min 2X/week      PT Plan Current plan remains appropriate    Co-evaluation             End of Session Equipment Utilized During  Treatment: Gait belt Activity Tolerance: Other (comment) (weakness, balance) Patient left: in chair;with call bell/phone within reach;Other (comment) (continues without chair alarm; nsg asst to )     Time: 1610-96041334-1403 PT Time Calculation (min) (ACUTE ONLY): 29 min  Charges:  $Gait Training: 8-22 mins $Therapeutic Activity: 8-22 mins                    G Codes:      Scot DockHeidi E Barnes, PTA 01/23/2016, 2:17 PM

## 2016-01-24 LAB — CBC
HCT: 24.6 % — ABNORMAL LOW (ref 35.0–47.0)
Hemoglobin: 8 g/dL — ABNORMAL LOW (ref 12.0–16.0)
MCH: 30.4 pg (ref 26.0–34.0)
MCHC: 32.7 g/dL (ref 32.0–36.0)
MCV: 92.8 fL (ref 80.0–100.0)
PLATELETS: 427 10*3/uL (ref 150–440)
RBC: 2.65 MIL/uL — ABNORMAL LOW (ref 3.80–5.20)
RDW: 14.7 % — AB (ref 11.5–14.5)
WBC: 14.7 10*3/uL — AB (ref 3.6–11.0)

## 2016-01-24 LAB — GLUCOSE, CAPILLARY
GLUCOSE-CAPILLARY: 128 mg/dL — AB (ref 65–99)
GLUCOSE-CAPILLARY: 144 mg/dL — AB (ref 65–99)
GLUCOSE-CAPILLARY: 149 mg/dL — AB (ref 65–99)
Glucose-Capillary: 145 mg/dL — ABNORMAL HIGH (ref 65–99)

## 2016-01-24 MED ORDER — NYSTATIN 100000 UNIT/GM EX CREA
TOPICAL_CREAM | Freq: Three times a day (TID) | CUTANEOUS | Status: DC
  Administered 2016-01-24 – 2016-01-25 (×4): via TOPICAL
  Filled 2016-01-24: qty 15

## 2016-01-24 NOTE — Clinical Social Work Note (Signed)
CSW has provided multiple family members bed offers that have been made for patient. CSW has now spoken to patient's daughter as well and she has [facility]. MD states she will discharge patient more than likely tomorrow.  York SpanielMonica Kreed Kauffman MSW,LCSW [phone]

## 2016-01-24 NOTE — Progress Notes (Signed)
Physical Therapy Treatment Patient Details Name: [name] CreedHallie G Highfill MRN: [id number] DOB: Dec 27, 1933 Today's Date: 01/24/2016    History of Present Illness presented to ER with 5 day history of abdominal pain; admitted with incarcerated periumbilical hernia with high-grade SBO, ketoacidosis (unable to take meds due to nausea/vomiting).  Status post ventral hernia repair with small bowel resection, lysis of adhesions (12/4).  Course complicated by post-op ileus; now upgraded to regular diet and tolerating well.  Anticipating discharge to STR next date.    PT Comments    Patient continues with persistent posterior lean/weight shift with all standing activities; limited/no attempts at spontaneous righting.  Progressively increasing gait distance (and overall LE strength/stability), but unsafe to complete without +1 hands-on assist at all times. Patient very motivated, eager to participate and progress.   Follow Up Recommendations  SNF     Equipment Recommendations  Rolling walker with 5" wheels    Recommendations for Other Services       Precautions / Restrictions Precautions Precautions: Fall Restrictions Weight Bearing Restrictions: No    Mobility  Bed Mobility               General bed mobility comments: seated in recliner beginning/end of session  Transfers Overall transfer level: Needs assistance Equipment used: Rolling walker (2 wheeled) Transfers: Sit to/from Stand Sit to Stand: Min assist;Mod assist         General transfer comment: cuing for hand placement, lift off; multiple attempts required to complete, mod use of momemtum  Ambulation/Gait Ambulation/Gait assistance: Min assist;Mod assist Ambulation Distance (Feet): 75 Feet (50) Assistive device: Rolling walker (2 wheeled)       General Gait Details: step to gait patterh with short, shuffing steps; forward flexed posture; manual cuing L posterior/lateral hip for stabilization in loading phases (mod  antalgic).  Progressive bilat knee flexion with fatigue, though no overt buckling this date.   Stairs            Wheelchair Mobility    Modified Rankin (Stroke Patients Only)       Balance Overall balance assessment: Needs assistance Sitting-balance support: No upper extremity supported;Feet supported Sitting balance-Leahy Scale: Good     Standing balance support: Bilateral upper extremity supported Standing balance-Leahy Scale: Poor Standing balance comment: persistent posterior lateral trunk lean with little/no spontaneous righting                    Cognition Arousal/Alertness: Awake/alert Behavior During Therapy: WFL for tasks assessed/performed Overall Cognitive Status: Within Functional Limits for tasks assessed                      Exercises Other Exercises Other Exercises: Seated LE therex, 1x15, AROM for muscular strength/endurance with functional activities.  Sit/stand x3 with RW, for clothing management/hygiene after incontinent bladder episode, mod assist; standing balance with RW, mod assist.  Limited awareness of balance deficits and need for assist; frequently attempting tasks unsafe to complete independently.    General Comments        Pertinent Vitals/Pain Pain Assessment: No/denies pain    Home Living                      Prior Function            PT Goals (current goals can now be found in the care plan section) Acute Rehab PT Goals Patient Stated Goal: to go home with my daughter PT Goal Formulation: With patient Time For  Goal Achievement: 02/01/16 Potential to Achieve Goals: Good Progress towards PT goals: Progressing toward goals    Frequency    Min 2X/week      PT Plan Current plan remains appropriate    Co-evaluation             End of Session Equipment Utilized During Treatment: Gait belt Activity Tolerance: Patient tolerated treatment well Patient left: in chair;with call bell/phone  within reach;with family/visitor present (alarm box not present in room; CNA/RN aware of patient position)     Time: [id number] PT Time Calculation (min) (ACUTE ONLY): 25 min  Charges:  $Gait Training: 8-22 mins $Therapeutic Exercise: 8-22 mins                    G Codes:      Taran Hable H. Manson PasseyBrown, PT, DPT, NCS 01/24/16, 4:04 PM [phone]

## 2016-01-24 NOTE — Progress Notes (Signed)
82 yr old female with strangulated incisional hernia POD#10 Incisional hernia repair and resection of small bowel.  Patient doing well today.  She has tolerated a full liquid diet and been passing flatus and had BMs.  She has been working with physcial therapy as well she has walked in the room and has been doing her exercises in the chair. She was able to sit up in the chair for a long time yesterday. She does have multiple friends and family members coming and help her with support as well.  Vitals:   01/24/16 1215 01/24/16 1244  BP: 120/60 (!) 141/58  Pulse: 88 89  Resp:  18  Temp:  97.5 F (36.4 C)   I/O last 3 completed shifts: In: 1620 [P.O.:1620] Out: -  Total I/O In: 120 [P.O.:120] Out: -    PE:  Gen:NAD Abd: soft, non-tender, incision c/d/i, no erythema or drainage Ext: 2+ pulses, no edema   CBC Latest Ref Rng & Units 01/24/2016 01/23/2016 01/22/2016  WBC 3.6 - 11.0 K/uL 14.7(H) 14.3(H) 17.8(H)  Hemoglobin 12.0 - 16.0 g/dL 8.0(L) 8.3(L) 8.4(L)  Hematocrit 35.0 - 47.0 % 24.6(L) 25.3(L) 25.5(L)  Platelets 150 - 440 K/uL 427 403 386   CMP Latest Ref Rng & Units 01/23/2016 01/22/2016 01/21/2016  Glucose 65 - 99 mg/dL 865(H111(H) 846(N204(H) 629(B172(H)  BUN 6 - 20 mg/dL 18 19 18   Creatinine 0.44 - 1.00 mg/dL 2.840.54 1.320.58 4.400.49  Sodium 135 - 145 mmol/L 138 133(L) 136  Potassium 3.5 - 5.1 mmol/L 3.8 3.3(L) 3.8  Chloride 101 - 111 mmol/L 102 98(L) 101  CO2 22 - 32 mmol/L 30 29 29   Calcium 8.9 - 10.3 mg/dL 1.0(U8.4(L) 7.2(Z8.2(L) 3.6(U8.5(L)  Total Protein 6.5 - 8.1 g/dL - - -  Total Bilirubin 0.3 - 1.2 mg/dL - - -  Alkaline Phos 38 - 126 U/L - - -  AST 15 - 41 U/L - - -  ALT 14 - 54 U/L - - -   A/P: 82 yr old female with strangulated incisional hernia POD#10 Incisional hernia repair and resection of small bowel.   Pain: Tylenol scheduled, oxycodone and Dilaudid prns Wheezing/congestion: continue guafenisen and pulm toilet GI: advance to soft diet today DM: continue regular and SSI, appreciate  medicine help ID: continue Zosyn for peritonitis, wbc down to 14 today Encourage ambulation: continue PT treatment, likely SNF placement tomorrow if does well with diet

## 2016-01-24 NOTE — Progress Notes (Signed)
Pt noted to have skin breakdown under breast bilaterally. Primary nurse notified Dr. Michela PitcherEly. Dr. Michela PitcherEly to place orders. Primary nurse to continue to monitor.

## 2016-01-24 NOTE — Clinical Social Work Note (Signed)
Patient's daughter, Shaune PascalCamilla, stopped by hospital and requested to speak with CSW. Shaune PascalCamilla stated that they definitely wanted patient to go to [facility]. She stated that her brother would transport her to [facility] when time and that his name is: Maisie Fushomas and contact number: [phone]. York SpanielMonica Rhyatt Muska MSW,LCSW [phone]

## 2016-01-25 LAB — GLUCOSE, CAPILLARY
GLUCOSE-CAPILLARY: 110 mg/dL — AB (ref 65–99)
Glucose-Capillary: 198 mg/dL — ABNORMAL HIGH (ref 65–99)

## 2016-01-25 MED ORDER — BOOST / RESOURCE BREEZE PO LIQD: 1.0000 | Freq: Three times a day (TID) | ORAL | 3 refills | Status: DC

## 2016-01-25 MED ORDER — NYSTATIN 100000 UNIT/GM EX CREA: TOPICAL_CREAM | Freq: Three times a day (TID) | CUTANEOUS | 0 refills | Status: DC

## 2016-01-25 MED ORDER — ACETAMINOPHEN 325 MG PO TABS: 650.0000 mg | ORAL_TABLET | Freq: Four times a day (QID) | ORAL | 0 refills | Status: DC | PRN

## 2016-01-25 MED ORDER — METOPROLOL TARTRATE 25 MG PO TABS: 12.5000 mg | ORAL_TABLET | Freq: Two times a day (BID) | ORAL | 1 refills | Status: DC

## 2016-01-25 NOTE — Clinical Social Work Placement (Signed)
   CLINICAL SOCIAL WORK PLACEMENT  NOTE  Date:  01/25/2016  Patient Details  Name: [name] MRN: [id number] Date of Birth: 30-Sep-1933  Clinical Social Work is seeking post-discharge placement for this patient at the Skilled  Nursing Facility level of care (*CSW will initial, date and re-position this form in  chart as items are completed):  Yes   Patient/family provided with [facility] Clinical Social Work Department's list of facilities offering this level of care within the geographic area requested by the patient (or if unable, by the patient's family).  Yes   Patient/family informed of their freedom to choose among providers that offer the needed level of care, that participate in Medicare, Medicaid or managed care program needed by the patient, have an available bed and are willing to accept the patient.  Yes   Patient/family informed of [facility]'s ownership interest in [facility] and [facility], as well as of the fact that they are under no obligation to receive care at these facilities.  PASRR submitted to EDS on 01/18/16     PASRR number received on 01/18/16     Existing PASRR number confirmed on       FL2 transmitted to all facilities in geographic area requested by pt/family on 01/18/16     FL2 transmitted to all facilities within larger geographic area on       Patient informed that his/her managed care company has contracts with or will negotiate with certain facilities, including the following:        Yes   Patient/family informed of bed offers received.  Patient chooses bed at  [facility])     Physician recommends and patient chooses bed at  [facility](SNF)    Patient to be transferred to  [facility]) on 01/25/16.  Patient to be transferred to facility by  (family vehicle)     Patient family notified on 01/25/16 of transfer.  Name of family member notified:  Camilla [facility]     PHYSICIAN       Additional Comment:     _______________________________________________ York SpanielMonica Thea Holshouser, LCSW 01/25/2016, 12:19 PM

## 2016-01-25 NOTE — Progress Notes (Addendum)
Patient discharged to home as ordered. Patient is alert and oriented and ambulates with front wheel walker. Along with one person assistance. Son at the bedside to take patient to [facility] for rehab. [facility] Tamala SerAngela Walker LPN. Patient given a bath and Nystatin creme applied to under patient breast and patient instructed to not wear a bra until area heals. Tamala [facility]Angela Walker [facility] states that follow up appointment already made for Dr. Orvis BrillLoflin. PICC line discontinued site clean dry and intact

## 2016-01-25 NOTE — Clinical Social Work Note (Signed)
[facility]charge [facility] and [facility]. Patient's family to transport. York SpanielMonica Jatin Naumann MSW,LCSW [phone]

## 2016-01-25 NOTE — Discharge Summary (Signed)
Physician Discharge Summary  Patient ID: [name] MRN: [id number] DOB/AGE: September 01, 1933 82 y.o.  Admit date: 01/13/2016 Discharge date: 01/25/2016  Admission Diagnoses: Incarcerated ventral hernia; DKA  Discharge Diagnoses:  Active Problems:   Incarcerated ventral hernia   Diabetic ketoacidosis without coma associated with type 2 diabetes mellitus (HCC)   Dehydration   Malnutrition of moderate degree   Discharged Condition: fair  Hospital Course: 82 year old female who came in in DKA with sugars of 500 with an incarcerated ventral hernia. The patient was sent to the ICU on insulin drip to correct her electrolytes and to get the glucose down to an acceptable level. Once this was completed the patient was then taken to the operating room for an open incisional hernia repair with resection of a portion of small bowel and reanastomosis. The patient was then in the ICU for another day and then was sent to the floor. The patient did have a postoperative ileus however now has resolved that is tolerating a soft diet. The patient is having good bowel movements without any issues. The patient has been working with physical therapy who recommend a nursing home placement for acute rehabilitation. The patient will be going there today  Consults: Medicine  Significant Diagnostic Studies: CT scan  Treatments: surgery: Open incisional hernia repair, resection of small bowel on 12/3  Discharge Exam: Blood pressure 140/85, pulse 90, temperature 97.9 F (36.6 C), temperature source Oral, resp. rate 20, height 5\' 4"  (1.626 m), weight 151 lb 14.4 oz (68.9 kg), SpO2 100 %. General appearance: alert, cooperative and no distress GI: soft, non tender, incision c/d/i with staples in place Extremities: extremities normal, atraumatic, no cyanosis or edema  Disposition:   Discharge Instructions    Call MD for:  difficulty breathing, headache or visual disturbances    Complete by:  As directed    Call  MD for:  persistant nausea and vomiting    Complete by:  As directed    Call MD for:  redness, tenderness, or signs of infection (pain, swelling, redness, odor or green/yellow discharge around incision site)    Complete by:  As directed    Call MD for:  severe uncontrolled pain    Complete by:  As directed    Call MD for:  temperature >100.4    Complete by:  As directed    Diet - low sodium heart healthy    Complete by:  As directed    Driving Restrictions    Complete by:  As directed    No driving while on prescription pain medication   Increase activity slowly    Complete by:  As directed    Lifting restrictions    Complete by:  As directed    No lifting over 15lbs for 6 weeks   May shower / Bathe    Complete by:  As directed    No dressing needed    Complete by:  As directed    PICC line removal    Complete by:  As directed      Allergies as of 01/25/2016   No Known Allergies     Medication List    STOP taking these medications   ciprofloxacin 500 MG tablet Commonly known as:  CIPRO   metroNIDAZOLE 500 MG tablet Commonly known as:  FLAGYL     TAKE these medications   acetaminophen 325 MG tablet Commonly known as:  TYLENOL Take 2 tablets (650 mg total) by mouth every 6 (six) hours as needed  for mild pain, moderate pain, fever or headache.   aspirin EC 81 MG tablet Take 81 mg by mouth daily.   feeding supplement Liqd Take 1 Container by mouth 3 (three) times daily between meals.   glimepiride 4 MG tablet Commonly known as:  AMARYL Take 4 mg by mouth daily.   hydrochlorothiazide 25 MG tablet Commonly known as:  HYDRODIURIL Take 25 mg by mouth daily.   lisinopril 20 MG tablet Commonly known as:  PRINIVIL,ZESTRIL Take 20 mg by mouth daily.   metoprolol tartrate 25 MG tablet Commonly known as:  LOPRESSOR Take 0.5 tablets (12.5 mg total) by mouth 2 (two) times daily.   nystatin cream Commonly known as:  MYCOSTATIN Apply topically 3 (three) times  daily.   simvastatin 40 MG tablet Commonly known as:  ZOCOR Take 40 mg by mouth daily.   traMADol-acetaminophen 37.5-325 MG tablet Commonly known as:  ULTRACET Take 1 tablet by mouth every 8 (eight) hours as needed.       Contact information for follow-up providers    [facility] [place]. Schedule an appointment as soon as possible for a visit.   Specialty:  General Surgery Why:  f/u in 1 week s/p Incisional hernia repair Contact information: [address] [place] [id number] [phone]           Contact information for after-discharge care    Destination    [facility] Follow up.   Specialty:  Skilled Nursing Facility Contact information: [address] [place] [id number] [phone]                  Signed: Gladis RiffleCatherine L Ellayna [name] 01/25/2016, 11:29 AM

## 2016-02-01 ENCOUNTER — Ambulatory Visit (INDEPENDENT_AMBULATORY_CARE_PROVIDER_SITE_OTHER): Payer: Medicare Other | Admitting: Surgery

## 2016-02-01 ENCOUNTER — Encounter: Payer: Self-pay | Admitting: Surgery

## 2016-02-01 VITALS — BP 116/63 | HR 77 | Temp 98.3°F | Ht 64.0 in | Wt 137.0 lb

## 2016-02-01 DIAGNOSIS — K460 Unspecified abdominal hernia with obstruction, without gangrene: Secondary | ICD-10-CM

## 2016-02-01 DIAGNOSIS — K436 Other and unspecified ventral hernia with obstruction, without gangrene: Secondary | ICD-10-CM

## 2016-02-01 NOTE — Progress Notes (Signed)
82 year old female had an incarcerated ventral hernia and hyperglycemia and she had a open incisional hernia repair with resection of small bowel on 12/3 with myself. The patient was in the hospital for about a week afterwards having a postoperative ileus and she was evaluated by physical therapy who recommended acute rehabilitation. She comes from the nursing home today where she is completing rehabilitation. The patient states her appetite is back and she is having good bowel movements one every day. She states that she is not having any pain and overall is getting better and that they're doing physical therapy with her once or twice a day.  Vitals:   02/01/16 1350  BP: 116/63  Pulse: 77  Temp: 98.3 F (36.8 C)   PE:  Gen: NAD ABd: soft, incision c/d/i staples removed, steri strips placed, no erythema or drainage.   A/P:  Patient is overall healing well given her age of 89 and large surgery that she had. She is continuing on at the rehabilitation center. She was instructed that if she had any questions concerns drainage from the wound that she is to give us a call.

## 2016-03-18 ENCOUNTER — Other Ambulatory Visit: Payer: Self-pay | Admitting: Internal Medicine

## 2016-03-18 DIAGNOSIS — Z1231 Encounter for screening mammogram for malignant neoplasm of breast: Secondary | ICD-10-CM

## 2016-03-28 ENCOUNTER — Ambulatory Visit
Admission: RE | Admit: 2016-03-28 | Discharge: 2016-03-28 | Disposition: A | Discharge status: Home / Self Care | Payer: Medicare Other | Source: Ambulatory Visit | Attending: Internal Medicine | Admitting: Internal Medicine

## 2016-03-28 DIAGNOSIS — Z1231 Encounter for screening mammogram for malignant neoplasm of breast: Secondary | ICD-10-CM | POA: Insufficient documentation

## 2016-03-30 DIAGNOSIS — E7800 Pure hypercholesterolemia, unspecified: Secondary | ICD-10-CM | POA: Insufficient documentation

## 2017-02-28 ENCOUNTER — Emergency Department
Admission: EM | Admit: 2017-02-28 | Discharge: 2017-02-28 | Disposition: A | Discharge status: Home / Self Care | Payer: Medicare Other | Attending: Emergency Medicine | Admitting: Emergency Medicine

## 2017-02-28 ENCOUNTER — Other Ambulatory Visit: Payer: Self-pay

## 2017-02-28 DIAGNOSIS — Z7984 Long term (current) use of oral hypoglycemic drugs: Secondary | ICD-10-CM | POA: Insufficient documentation

## 2017-02-28 DIAGNOSIS — Z96653 Presence of artificial knee joint, bilateral: Secondary | ICD-10-CM | POA: Insufficient documentation

## 2017-02-28 DIAGNOSIS — N390 Urinary tract infection, site not specified: Secondary | ICD-10-CM | POA: Insufficient documentation

## 2017-02-28 DIAGNOSIS — Z9181 History of falling: Secondary | ICD-10-CM | POA: Insufficient documentation

## 2017-02-28 DIAGNOSIS — Z79899 Other long term (current) drug therapy: Secondary | ICD-10-CM | POA: Insufficient documentation

## 2017-02-28 DIAGNOSIS — Z7982 Long term (current) use of aspirin: Secondary | ICD-10-CM | POA: Insufficient documentation

## 2017-02-28 DIAGNOSIS — I10 Essential (primary) hypertension: Secondary | ICD-10-CM | POA: Diagnosis not present

## 2017-02-28 DIAGNOSIS — E1142 Type 2 diabetes mellitus with diabetic polyneuropathy: Secondary | ICD-10-CM | POA: Diagnosis not present

## 2017-02-28 DIAGNOSIS — R42 Dizziness and giddiness: Secondary | ICD-10-CM | POA: Diagnosis not present

## 2017-02-28 LAB — BASIC METABOLIC PANEL
Anion gap: 9 (ref 5–15)
BUN: 14 mg/dL (ref 6–20)
CALCIUM: 9.1 mg/dL (ref 8.9–10.3)
CO2: 26 mmol/L (ref 22–32)
CREATININE: 0.57 mg/dL (ref 0.44–1.00)
Chloride: 104 mmol/L (ref 101–111)
GFR calc Af Amer: >60 mL/min (ref >60)
GLUCOSE: 161 mg/dL — AB (ref 65–99)
Potassium: 4.2 mmol/L (ref 3.5–5.1)
Sodium: 139 mmol/L (ref 135–145)

## 2017-02-28 LAB — URINALYSIS, COMPLETE (UACMP) WITH MICROSCOPIC
Bilirubin Urine: NEGATIVE
GLUCOSE, UA: 150 mg/dL — AB
KETONES UR: NEGATIVE mg/dL
NITRITE: NEGATIVE
PH: 5 (ref 5.0–8.0)
Protein, ur: 30 mg/dL — AB
Specific Gravity, Urine: 1.021 (ref 1.005–1.030)

## 2017-02-28 LAB — CBC
HCT: 36.8 % (ref 35.0–47.0)
HEMOGLOBIN: 12.1 g/dL (ref 12.0–16.0)
MCH: 31.1 pg (ref 26.0–34.0)
MCHC: 32.7 g/dL (ref 32.0–36.0)
MCV: 95.1 fL (ref 80.0–100.0)
PLATELETS: 231 10*3/uL (ref 150–440)
RBC: 3.87 MIL/uL (ref 3.80–5.20)
RDW: 13.3 % (ref 11.5–14.5)
WBC: 6.5 10*3/uL (ref 3.6–11.0)

## 2017-02-28 LAB — TROPONIN I: Troponin I: <0.03 ng/mL (ref <0.03)

## 2017-02-28 MED ORDER — CEPHALEXIN 500 MG PO CAPS: 500.0000 mg | ORAL_CAPSULE | Freq: Two times a day (BID) | ORAL | 0 refills | Status: AC

## 2017-02-28 MED ORDER — CEPHALEXIN 500 MG PO CAPS
500.0000 mg | ORAL_CAPSULE | Freq: Once | ORAL | Status: AC
  Administered 2017-02-28: 500 mg via ORAL
  Filled 2017-02-28: qty 1

## 2017-02-28 NOTE — ED Notes (Signed)
Pt states that this am she was going to do the laundry and got dizzy and fell - she attempted to catch herself and fell hitting her left side - at this time pt denies any pain or problems - denies dizziness or lightheadedness

## 2017-02-28 NOTE — Discharge Instructions (Signed)
Return to the ER for new, worsening, persistent dizziness, lightheadedness, weakness or numbness, confusion or change in mental status, high fevers, vomiting or inability to take the antibiotics, or any other new or worsening symptoms that concern you.  Take the antibiotic as prescribed and finish the full course.  Follow-up with your doctor within the next 1-2 weeks.

## 2017-02-28 NOTE — ED Triage Notes (Signed)
Pt came to ED via pov c/o dizziness followed by a fall this morning. Denies hitting head. No LOC. Denies pain currently. Similar episode 2 weeks ago.

## 2017-02-28 NOTE — ED Provider Notes (Signed)
[facility] Emergency Department Provider Note ____________________________________________   First MD Initiated Contact with Patient 02/28/17 1521     (approximate)  I have reviewed the triage vital signs and the nursing notes.   HISTORY  Chief Complaint Dizziness and Fall    HPI [name] is a 84 y.o. female with past medical history as noted below who presents with dizziness, acute onset and described as lightheadedness, and associated with a fall.  Patient states that she was doing the laundry in her home, and felt the dizziness relatively suddenly.  She tried to catch herself, and fell hitting the left side of her torso.  She denies hitting her head, and denies any pain from the fall currently.  She had no LOC.  Patient states she had one similar episode within the last few weeks, but otherwise has been in her usual state of health.  She has had no generalized weakness, fevers, vomiting, urinary symptoms, chest pain, difficulty breathing.  She denies any spinning or vertigo type sensation.  Past Medical History:  Diagnosis Date  . Carpal tunnel syndrome on both sides   . Diabetes (HCC)   . Diabetic polyneuropathy (HCC)   . Diverticulitis   . Hyperlipidemia   . Hypertension   . Osteoarthritis     Patient Active Problem List   Diagnosis Date Noted  . Malnutrition of moderate degree 01/15/2016  . Dehydration   . Incarcerated ventral hernia   . Diabetic ketoacidosis without coma associated with type 2 diabetes mellitus (HCC)   . Type 2 diabetes mellitus with polyneuropathy (HCC) 08/06/2015  . Carpal tunnel syndrome, bilateral 07/10/2015  . Osteopenia of multiple sites 07/10/2015  . Arthralgia of multiple sites 06/27/2015  . Mixed hyperlipidemia 11/06/2014  . Hypomagnesemia 11/02/2013  . Essential hypertension 11/02/2013    Past Surgical History:  Procedure Laterality Date  . CESAREAN SECTION    . pessary    . REPLACEMENT TOTAL KNEE  BILATERAL    . VENTRAL HERNIA REPAIR    . VENTRAL HERNIA REPAIR N/A 01/13/2016   Procedure: HERNIA REPAIR VENTRAL ADULT WITH SMALL BOWEL RESECTION, LYSIS OF ADHESIONS;  Surgeon: Gladis Riffle, MD;  Location: [facility] ORS;  Service: General;  Laterality: N/A;    Prior to Admission medications   Medication Sig Start Date End Date Taking? Authorizing Provider  acetaminophen (TYLENOL) 325 MG tablet Take 2 tablets (650 mg total) by mouth every 6 (six) hours as needed for mild pain, moderate pain, fever or headache. 01/25/16  Yes Loflin, Laney Potash, MD  aspirin EC 81 MG tablet Take 81 mg by mouth daily.   Yes [provider]  glimepiride (AMARYL) 2 MG tablet Take 1 mg by mouth daily.  04/27/15  Yes [provider]  lisinopril (PRINIVIL,ZESTRIL) 5 MG tablet Take 5 mg by mouth daily.    Yes [provider]  metFORMIN (GLUCOPHAGE) 500 MG tablet Take 500 mg by mouth 2 (two) times daily with a meal.   Yes [provider]  metoprolol succinate (TOPROL-XL) 25 MG 24 hr tablet Take 12.5 mg by mouth 2 (two) times daily.   Yes [provider]  simvastatin (ZOCOR) 40 MG tablet Take 40 mg by mouth daily. 10/16/15  Yes [provider]  sitaGLIPtin (JANUVIA) 100 MG tablet Take 100 mg by mouth daily.   Yes [provider]  metoprolol tartrate (LOPRESSOR) 25 MG tablet Take 0.5 tablets (12.5 mg total) by mouth 2 (two) times daily. Patient not taking: Reported  on 02/28/2017 01/25/16   Gladis Riffle, MD  nystatin cream (MYCOSTATIN) Apply topically 3 (three) times daily. Patient not taking: Reported on 02/28/2017 01/25/16   Gladis Riffle, MD    Allergies Patient has no known allergies.  Family History  Problem Relation Age of Onset  . Breast cancer Mother 88  . Breast cancer Maternal Aunt     Social History Social History   Tobacco Use  . Smoking status: Never Smoker  . Smokeless tobacco: Never Used  Substance Use Topics  . Alcohol  use: No  . Drug use: No    Review of Systems  Constitutional: No fever. Eyes: No visual changes. ENT: No sore throat. Cardiovascular: Denies chest pain. Respiratory: Denies shortness of breath. Gastrointestinal: No nausea, no vomiting.  No diarrhea.  Genitourinary: Negative for dysuria.  Musculoskeletal: Negative for back pain. Skin: Negative for rash. Neurological: Negative for headache.   ____________________________________________   PHYSICAL EXAM:  VITAL SIGNS: ED Triage Vitals  Enc Vitals Group     BP 02/28/17 1226 (!) 187/69     Pulse Rate 02/28/17 1226 (!) 58     Resp 02/28/17 1226 18     Temp 02/28/17 1226 97.8 F (36.6 C)     Temp src --      SpO2 02/28/17 1226 99 %     Weight 02/28/17 1227 145 lb (65.8 kg)     Height 02/28/17 1227 5\' 4"  (1.626 m)     Head Circumference --      Peak Flow --      Pain Score 02/28/17 1526 0     Pain Loc --      Pain Edu? --      Excl. in GC? --     Constitutional: Alert and oriented. Well appearing and in no acute distress. Eyes: Conjunctivae are normal.  EOMI. Head: Atraumatic. Nose: No congestion/rhinnorhea. Mouth/Throat: Mucous membranes are moist.   Neck: Normal range of motion.  No midline cervical spine tenderness. Cardiovascular: Normal rate, regular rhythm. Grossly normal heart sounds.  Good peripheral circulation. Respiratory: Normal respiratory effort.  No retractions. Lungs CTAB. Gastrointestinal: Soft and nontender. No distention.  Genitourinary: No flank tenderness. Musculoskeletal: No lower extremity edema.  Extremities warm and well perfused.  No midline spinal tenderness. Neurologic:  Normal speech and language.  Motor and sensory intact in all extremities.  Normal coordination.  No ataxia.  No gross focal neurologic deficits are appreciated.  Skin:  Skin is warm and dry. No rash noted. Psychiatric: Mood and affect are normal. Speech and behavior are  normal.  ____________________________________________   LABS (all labs ordered are listed, but only abnormal results are displayed)  Labs Reviewed  BASIC METABOLIC PANEL - Abnormal; Notable for the following components:      Result Value   Glucose, Bld 161 (*)    All other components within normal limits  URINALYSIS, COMPLETE (UACMP) WITH MICROSCOPIC - Abnormal; Notable for the following components:   Color, Urine YELLOW (*)    APPearance CLOUDY (*)    Glucose, UA 150 (*)    Hgb urine dipstick SMALL (*)    Protein, ur 30 (*)    Leukocytes, UA LARGE (*)    Bacteria, UA RARE (*)    Squamous Epithelial / LPF 0-5 (*)    All other components within normal limits  CBC  TROPONIN I  CBG MONITORING, ED   ____________________________________________  EKG  ED ECG REPORT I, Dionne Bucy, the attending physician, personally viewed and  interpreted this ECG.  Date: 02/28/2017 EKG Time: 1223 Rate: 60 Rhythm: normal sinus rhythm QRS Axis: normal Intervals: normal ST/T Wave abnormalities: normal Narrative Interpretation: no evidence of acute ischemia  ____________________________________________  RADIOLOGY    ____________________________________________   PROCEDURES  Procedure(s) performed: No    Critical Care performed: No ____________________________________________   INITIAL IMPRESSION / ASSESSMENT AND PLAN / ED COURSE  Pertinent labs & imaging results that were available during my care of the patient were reviewed by me and considered in my medical decision making (see chart for details).  84 year old female with past medical history as noted above presents after a fall precipitated by a brief episode of lightheadedness while doing her laundry today.  She reports a history of a similar episode a few weeks ago.  She fell on her side, and states she has had no apparent injury during the fall and denies any pain currently.  Past medical records reviewed in  [facility] and are noncontributory; the patient was last admitted in December 2017 for DKA.  On exam, vital signs are normal except for hypertension (patient states she did not take her antihypertensives today), the patient is relatively well-appearing, and the remainder the exam is unremarkable with no evidence of trauma and no other acute findings.  Differential includes balance problems, vasovagal episode, other cause of syncope; differential for the underlying cause is broad, but includes cardiac, dehydration or other metabolic cause, or infection.  Plan for basic labs, troponin x1, urinalysis, and reassess.  Given no head injury, no headache, and normal neurologic exam there is no indication for brain imaging.  ----------------------------------------- 5:09 PM on 02/28/2017 -----------------------------------------  Patient's lab workup is unremarkable except for a UA which is consistent with UTI.  On further history patient now does endorse that she did have an episode of urinary hesitancy and bladder discomfort which is consistent with the UA.  We will treat for UTI.  Patient is stable for outpatient treatment. She is not orthostatic and her vital signs have remained stable.  Blood pressure is elevated but improved.  I explained the findings of the workup and the discharge plan, and gave return precautions.  The patient and her family members expressed agreement and understanding.  ____________________________________________   FINAL CLINICAL IMPRESSION(S) / ED DIAGNOSES  Final diagnoses:  Urinary tract infection without hematuria, site unspecified  Dizziness      NEW MEDICATIONS STARTED DURING THIS VISIT:  New Prescriptions   No medications on file     Note:  This document was prepared using Dragon voice recognition software and may include unintentional dictation errors.    Dionne BucySiadecki, Hadrian Yarbrough, MD 02/28/17 1711

## 2017-04-27 DIAGNOSIS — M25473 Effusion, unspecified ankle: Secondary | ICD-10-CM | POA: Insufficient documentation

## 2017-05-04 ENCOUNTER — Other Ambulatory Visit: Payer: Self-pay | Admitting: Internal Medicine

## 2017-05-04 DIAGNOSIS — Z1231 Encounter for screening mammogram for malignant neoplasm of breast: Secondary | ICD-10-CM

## 2017-06-01 ENCOUNTER — Ambulatory Visit
Admission: RE | Admit: 2017-06-01 | Discharge: 2017-06-01 | Disposition: A | Discharge status: Home / Self Care | Payer: Medicare Other | Source: Ambulatory Visit | Attending: Internal Medicine | Admitting: Internal Medicine

## 2017-06-01 DIAGNOSIS — Z1231 Encounter for screening mammogram for malignant neoplasm of breast: Secondary | ICD-10-CM

## 2017-07-27 DIAGNOSIS — E1129 Type 2 diabetes mellitus with other diabetic kidney complication: Secondary | ICD-10-CM | POA: Insufficient documentation

## 2017-08-24 IMAGING — CR DG CHEST 2V
1 series · 2 of 2 positions shown · non-contrast
Comparison: Single-view of the chest 01/19/2016 and 01/16/2016. CT
abdomen and pelvis 01/17/2016.

CLINICAL DATA: Wheezing on expiration today.

EXAM:
CHEST  2 VIEW

[Series 1: dg chest 2 view · 0.14mm/px · 2 of 2 slices shown]
[im 1/2]
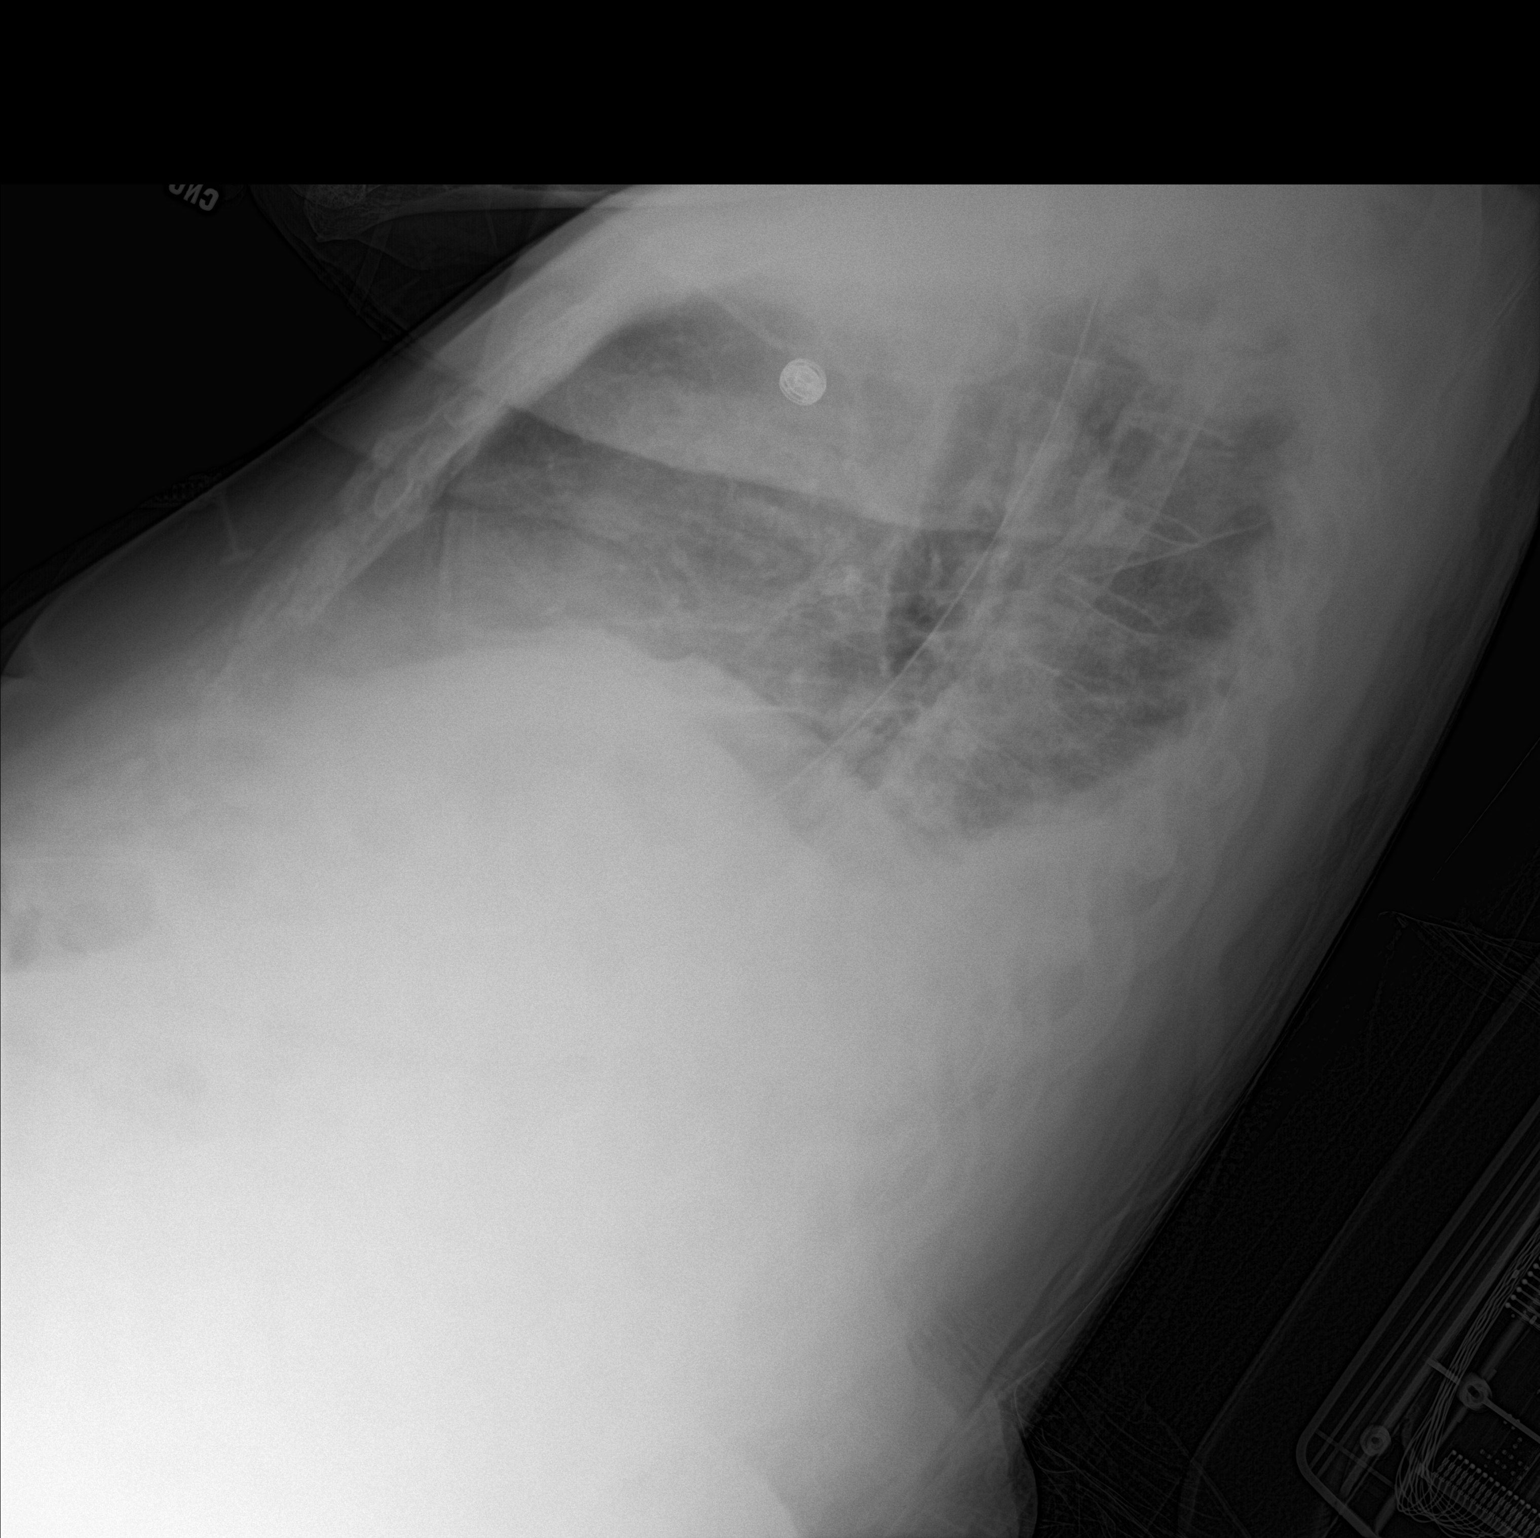
[im 2/2]
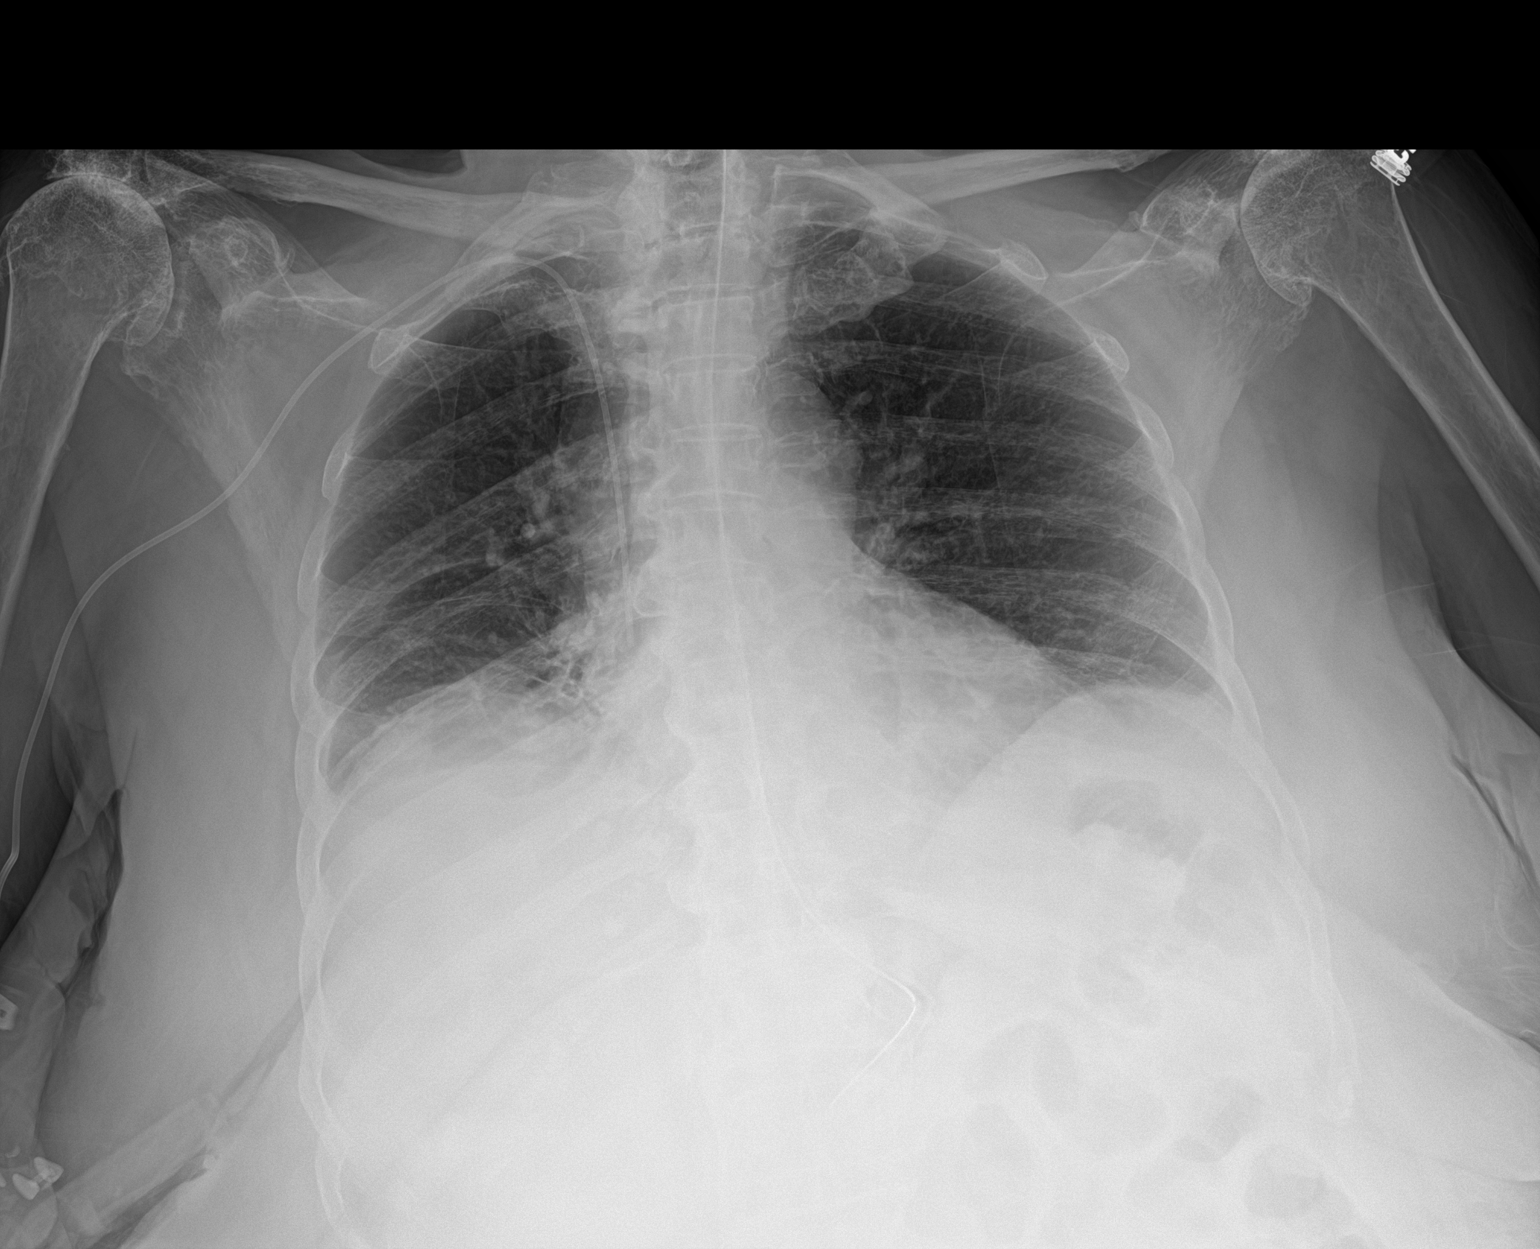

[2 of 2 positions shown; findings below may reference images not displayed]

FINDINGS: NG tube and right PICC remain in place. There small bilateral
pleural effusions with basilar atelectasis, greater on the right. No
pneumothorax. Heart size is normal. Severe degenerative disease
about the shoulders is noted.
IMPRESSION: No marked change in small bilateral pleural effusions and basilar
atelectasis, worse on the right.

## 2017-09-04 ENCOUNTER — Ambulatory Visit (INDEPENDENT_AMBULATORY_CARE_PROVIDER_SITE_OTHER): Payer: Medicare Other | Admitting: Obstetrics & Gynecology

## 2017-09-04 ENCOUNTER — Encounter: Payer: Self-pay | Admitting: Obstetrics & Gynecology

## 2017-09-04 ENCOUNTER — Other Ambulatory Visit (HOSPITAL_COMMUNITY)
Admission: RE | Admit: 2017-09-04 | Discharge: 2017-09-04 | Disposition: A | Discharge status: Home / Self Care | Payer: Medicare Other | Source: Ambulatory Visit | Attending: Obstetrics & Gynecology | Admitting: Obstetrics & Gynecology

## 2017-09-04 VITALS — BP 140/80 | Ht 64.0 in | Wt 160.0 lb

## 2017-09-04 DIAGNOSIS — N8111 Cystocele, midline: Secondary | ICD-10-CM

## 2017-09-04 DIAGNOSIS — Z124 Encounter for screening for malignant neoplasm of cervix: Secondary | ICD-10-CM | POA: Insufficient documentation

## 2017-09-04 DIAGNOSIS — N812 Incomplete uterovaginal prolapse: Secondary | ICD-10-CM | POA: Insufficient documentation

## 2017-09-04 NOTE — Progress Notes (Signed)
HPI:      [name] is a 84 y.o. [id number] who presents today for vaginal discharge and odor related to her pessary, for which she has had since 2013 but not been checked or cleaned or replaced since 01/2013.  She had uterine prolapse and cystocele for which she started this tx course but then stopped coming for follow up visits.  No bleeding reported.  Sx's of prolapse have been under control.  She has some urinary urgency and incontinence. She currently has a #6 Gellhorn pessary.  PMHx: She  has a past medical history of Carpal tunnel syndrome on both sides, Diabetes (HCC), Diabetic polyneuropathy (HCC), Diverticulitis, Hyperlipidemia, Hypertension, and Osteoarthritis. Also,  has a past surgical history that includes Ventral hernia repair; Cesarean section; Replacement total knee bilateral; pessary; and Ventral hernia repair (N/A, 01/13/2016)., family history includes Breast cancer in her maternal aunt; Breast cancer (age of onset: 2) in her mother.,  reports that she has never smoked. She has never used smokeless tobacco. She reports that she does not drink alcohol or use drugs.  She has a current medication list which includes the following prescription(s): acetaminophen, aspirin ec, glimepiride, lisinopril, metformin, metoprolol succinate, metoprolol tartrate, nystatin cream, simvastatin, and sitagliptin. Also, has No Known Allergies.  Review of Systems  Constitutional: Negative for chills, fever and malaise/fatigue.  HENT: Negative for congestion, sinus pain and sore throat.   Eyes: Negative for blurred vision and pain.  Respiratory: Negative for cough and wheezing.   Cardiovascular: Negative for chest pain and leg swelling.  Gastrointestinal: Negative for abdominal pain, constipation, diarrhea, heartburn, nausea and vomiting.  Genitourinary: Negative for dysuria, frequency, hematuria and urgency.  Musculoskeletal: Negative for back pain, joint pain, myalgias and neck pain.  Skin:  Negative for itching and rash.  Neurological: Negative for dizziness, tremors and weakness.  Endo/Heme/Allergies: Does not bruise/bleed easily.  Psychiatric/Behavioral: Negative for depression. The patient is not nervous/anxious and does not have insomnia.    Objective: BP 140/80   Ht 5\' 4"  (1.626 m)   Wt 160 lb (72.6 kg)   BMI 27.46 kg/m  Physical Exam  Constitutional: She is oriented to person, place, and time. She appears well-developed and well-nourished. No distress.  Genitourinary: Rectum normal, vagina normal and uterus normal. Pelvic exam was performed with patient supine. There is no rash or lesion on the right labia. There is no rash or lesion on the left labia. Vagina exhibits no lesion. No bleeding in the vagina. Right adnexum does not display mass and does not display tenderness. Left adnexum does not display mass and does not display tenderness. Cervix does not exhibit motion tenderness, lesion, friability or polyp.   Uterus is mobile and midaxial. Uterus is not enlarged or exhibiting a mass.  Genitourinary Comments: Pessary w significant residue and discharge and bleeding on removal. Subsequent spec exam and cleansing of vagina reveals normal cervix and no ulcerations Cystocele Gr 2 and POP Gr 2  HENT:  Head: Normocephalic and atraumatic. Head is without laceration.  Right Ear: Hearing normal.  Left Ear: Hearing normal.  Nose: No epistaxis.  No foreign bodies.  Mouth/Throat: Uvula is midline, oropharynx is clear and moist and mucous membranes are normal.  Eyes: Pupils are equal, round, and reactive to light.  Neck: Normal range of motion. Neck supple. No thyromegaly present.  Cardiovascular: Normal rate and regular rhythm. Exam reveals no gallop and no friction rub.  No murmur heard. Pulmonary/Chest: Effort normal and breath sounds normal. No respiratory distress.  She has no wheezes. Right breast exhibits no mass, no skin change and no tenderness. Left breast exhibits no  mass, no skin change and no tenderness.  Abdominal: Soft. Bowel sounds are normal. She exhibits no distension. There is no tenderness. There is no rebound.  Musculoskeletal: Normal range of motion.  Neurological: She is alert and oriented to person, place, and time. No cranial nerve deficit.  Skin: Skin is warm and dry.  Psychiatric: She has a normal mood and affect. Judgment normal.  Vitals reviewed.  Pessary Care Pessary removed and thrown out due to significant build up on it (no cleaning since 2014)  A/P: 1. Cystocele, midline 2. Incomplete uterine prolapse Plan holiday for one month and see if she still has sx's of POP/cystocele.    If so then can replace w new pessary (Gellhorn #5 or 6) with assurance of regular follow up 3. PAP today as is due  Annamarie MajorPaul Brynnleigh Mcelwee, MD, Merlinda FrederickFACOG [facility], [facility] 09/04/2017  11:35 AM

## 2017-09-11 LAB — CYTOLOGY - PAP: Diagnosis: NEGATIVE

## 2017-10-02 ENCOUNTER — Ambulatory Visit: Payer: Medicare Other | Admitting: Obstetrics & Gynecology

## 2017-10-06 ENCOUNTER — Ambulatory Visit (INDEPENDENT_AMBULATORY_CARE_PROVIDER_SITE_OTHER): Payer: Medicare Other | Admitting: Obstetrics & Gynecology

## 2017-10-06 ENCOUNTER — Encounter: Payer: Self-pay | Admitting: Obstetrics & Gynecology

## 2017-10-06 VITALS — BP 148/80 | Ht 64.0 in | Wt 162.0 lb

## 2017-10-06 DIAGNOSIS — N8111 Cystocele, midline: Secondary | ICD-10-CM | POA: Diagnosis not present

## 2017-10-06 DIAGNOSIS — N812 Incomplete uterovaginal prolapse: Secondary | ICD-10-CM

## 2017-10-06 NOTE — Progress Notes (Signed)
  Cystocele/Rectocele Patient complains of a cystocele. In the past, she had a pessary, but it was removed one month ago after no cleanings or checks on it for years; she had a discharge and odor and itching then.  She still has some itching.  Denies uribary sx's; denies vaginal pressure or bulge.  PMHx: She  has a past medical history of Carpal tunnel syndrome on both sides, Diabetes (HCC), Diabetic polyneuropathy (HCC), Diverticulitis, Hyperlipidemia, Hypertension, and Osteoarthritis. Also,  has a past surgical history that includes Ventral hernia repair; Cesarean section; Replacement total knee bilateral; pessary; and Ventral hernia repair (N/A, 01/13/2016)., family history includes Breast cancer in her maternal aunt; Breast cancer (age of onset: 7791) in her mother.,  reports that she has never smoked. She has never used smokeless tobacco. She reports that she does not drink alcohol or use drugs.  She has a current medication list which includes the following prescription(s): acetaminophen, aspirin ec, glimepiride, glimepiride, lisinopril, lisinopril-hydrochlorothiazide, metformin, metformin, metoprolol succinate, metoprolol tartrate, metoprolol succinate, nystatin cream, simvastatin, and sitagliptin. Also, has No Known Allergies.  Review of Systems  All other systems reviewed and are negative.  Objective: BP (!) 148/80   Ht 5\' 4"  (1.626 m)   Wt 162 lb (73.5 kg)   BMI 27.81 kg/m  Physical Exam  Constitutional: She is oriented to person, place, and time. She appears well-developed and well-nourished. No distress.  Genitourinary: Vagina normal and uterus normal. Pelvic exam was performed with patient supine. There is no rash, tenderness or lesion on the right labia. There is no rash, tenderness or lesion on the left labia. No erythema or bleeding in the vagina. Right adnexum does not display mass and does not display tenderness. Left adnexum does not display mass and does not display tenderness.  Cervix does not exhibit motion tenderness, discharge, polyp or nabothian cyst.   Uterus is mobile and midaxial. Uterus is not enlarged or exhibiting a mass.  Genitourinary Comments: Gr 3 Cystocele Gr 2 cervical/uterine prolapse Gr 1 rectocele Vaginal tissues healthy without excoriation or ulceration.  No discharge.  HENT:  Head: Normocephalic and atraumatic.  Nose: Nose normal.  Mouth/Throat: Oropharynx is clear and moist.  Abdominal: Soft. She exhibits no distension. There is no tenderness.  Musculoskeletal: Normal range of motion.  Neurological: She is alert and oriented to person, place, and time. No cranial nerve deficit.  Skin: Skin is warm and dry.  Psychiatric: She has a normal mood and affect.   ASSESSMENT/PLAN:   Problem List Items Addressed This Visit      Genitourinary   Cystocele, midline   Incomplete uterine prolapse - Primary    Pt does not desire pessary (or surgery) at this time Monitor for vaginal or urinary sx's  A total of 15 minutes were spent face-to-face with the patient during this encounter and over half of that time dealt with counseling and coordination of care.  Annamarie MajorPaul Dorethea Strubel, MD, Merlinda FrederickFACOG [facility], [facility] 10/06/2017  10:56 AM

## 2017-11-02 DIAGNOSIS — R809 Proteinuria, unspecified: Secondary | ICD-10-CM | POA: Insufficient documentation

## 2017-11-06 DIAGNOSIS — M160 Bilateral primary osteoarthritis of hip: Secondary | ICD-10-CM | POA: Insufficient documentation

## 2018-03-17 ENCOUNTER — Ambulatory Visit (INDEPENDENT_AMBULATORY_CARE_PROVIDER_SITE_OTHER): Payer: Medicare Other | Admitting: Obstetrics & Gynecology

## 2018-03-17 ENCOUNTER — Encounter: Payer: Self-pay | Admitting: Obstetrics & Gynecology

## 2018-03-17 VITALS — BP 150/80 | Ht 64.0 in | Wt 161.0 lb

## 2018-03-17 DIAGNOSIS — N812 Incomplete uterovaginal prolapse: Secondary | ICD-10-CM | POA: Diagnosis not present

## 2018-03-17 DIAGNOSIS — N8111 Cystocele, midline: Secondary | ICD-10-CM | POA: Diagnosis not present

## 2018-03-17 NOTE — Progress Notes (Signed)
HPI:      [name] is a 85 y.o. [id number] who presents today for her pelvic organ prolapse symptoms, mostly a vaginal bulge and at times feels uterus outside the introitus.  Also has urinary urgency and nocturia.  She has had a Gellhorn in past, but went several years without exam/ cleaning, and it removed under diffcult conditions last summer.  Her vagina has since healed from that trauma, with no d/c or bleeding. Her sx's have only recently worsened.    PMHx: She  has a past medical history of Carpal tunnel syndrome on both sides, Diabetes (HCC), Diabetic polyneuropathy (HCC), Diverticulitis, Hyperlipidemia, Hypertension, and Osteoarthritis. Also,  has a past surgical history that includes Ventral hernia repair; Cesarean section; Replacement total knee bilateral; pessary; and Ventral hernia repair (N/A, 01/13/2016)., family history includes Breast cancer in her maternal aunt; Breast cancer (age of onset: 2691) in her mother.,  reports that she has never smoked. She has never used smokeless tobacco. She reports that she does not drink alcohol or use drugs.  She has a current medication list which includes the following prescription(s): aspirin ec, glimepiride, glimepiride, lisinopril-hydrochlorothiazide, metformin, simvastatin, acetaminophen, lisinopril, metformin, metoprolol succinate, metoprolol succinate, metoprolol tartrate, nystatin cream, and sitagliptin. Also, has No Known Allergies.  Review of Systems  Constitutional: Negative for chills, fever and malaise/fatigue.  HENT: Negative for congestion, sinus pain and sore throat.   Eyes: Negative for blurred vision and pain.  Respiratory: Negative for cough and wheezing.   Cardiovascular: Negative for chest pain and leg swelling.  Gastrointestinal: Negative for abdominal pain, constipation, diarrhea, heartburn, nausea and vomiting.  Genitourinary: Negative for dysuria, frequency, hematuria and urgency.  Musculoskeletal: Negative for back  pain, joint pain, myalgias and neck pain.  Skin: Negative for itching and rash.  Neurological: Negative for dizziness, tremors and weakness.  Endo/Heme/Allergies: Does not bruise/bleed easily.  Psychiatric/Behavioral: Negative for depression. The patient is not nervous/anxious and does not have insomnia.     Objective: BP (!) 150/80   Ht 5\' 4"  (1.626 m)   Wt 161 lb (73 kg)   BMI 27.64 kg/m  Physical Exam Constitutional:      General: She is not in acute distress.    Appearance: She is well-developed.  Genitourinary:     Pelvic exam was performed with patient supine.     Vagina and uterus normal.     No vaginal erythema or bleeding.     No cervical motion tenderness, discharge, polyp or nabothian cyst.     Uterus is mobile.     Uterus is not enlarged.     No uterine mass detected.    Uterus is midaxial.     No right or left adnexal mass present.     Right adnexa not tender.     Left adnexa not tender.     Genitourinary Comments: Uterine prolpase w cystocele Gr 4 Normal tissues, min atrophy  HENT:     Head: Normocephalic and atraumatic.     Nose: Nose normal.  Abdominal:     General: There is no distension.     Palpations: Abdomen is soft.     Tenderness: There is no abdominal tenderness.  Musculoskeletal: Normal range of motion.  Neurological:     Mental Status: She is alert and oriented to person, place, and time.     Cranial Nerves: No cranial nerve deficit.  Skin:    General: Skin is warm and dry.   A/P: Pessary Gellhorn #3 placed today.  Instructions given for care.   Needs appts every 3 mos. Concerning symptoms to observe for are counseled to patient. Follow up scheduled for 3 months.  A total of 15 minutes were spent face-to-face with the patient during this encounter and over half of that time dealt with counseling and coordination of care.  Annamarie Major, MD, [facility], [facility] 03/17/2018  2:52 PM

## 2018-03-17 NOTE — Patient Instructions (Signed)
Pelvic Organ Prolapse Pelvic organ prolapse is the stretching, bulging, or dropping of pelvic organs into an abnormal position. It happens when the muscles and tissues that surround and support pelvic structures become weak or stretched. Pelvic organ prolapse can involve the:  Vagina (vaginal prolapse).  Uterus (uterine prolapse).  Bladder (cystocele).  Rectum (rectocele).  Intestines (enterocele). When organs other than the vagina are involved, they often bulge into the vagina or protrude from the vagina, depending on how severe the prolapse is. What are the causes? This condition may be caused by:  Pregnancy, labor, and childbirth.  Past pelvic surgery.  Decreased production of the hormone estrogen associated with menopause.  Consistently lifting more than 50 lb (23 kg).  Obesity.  Long-term inability to pass stool (chronic constipation).  A cough that lasts a long time (chronic).  Buildup of fluid in the abdomen due to certain diseases and other conditions. What are the signs or symptoms? Symptoms of this condition include:  Passing a little urine (loss of bladder control) when you cough, sneeze, strain, and exercise (stress incontinence). This may be worse immediately after childbirth. It may gradually improve over time.  Feeling pressure in your pelvis or vagina. This pressure may increase when you cough or when you are passing stool.  A bulge that protrudes from the opening of your vagina.  Difficulty passing urine or stool.  Pain in your lower back.  Pain, discomfort, or disinterest in sex.  Repeated bladder infections (urinary tract infections).  Difficulty inserting a tampon. In some people, this condition causes no symptoms. How is this diagnosed? This condition may be diagnosed based on a vaginal and rectal exam. During the exam, you may be asked to cough and strain while you are lying down, sitting, and standing up. Your health care provider will  determine if other tests are required, such as bladder function tests. How is this treated? Treatment for this condition may depend on your symptoms. Treatment may include:  Lifestyle changes, such as changes to your diet.  Emptying your bladder at scheduled times (bladder training therapy). This can help reduce or avoid urinary incontinence.  Estrogen. Estrogen may help mild prolapse by increasing the strength and tone of pelvic floor muscles.  Kegel exercises. These may help mild cases of prolapse by strengthening and tightening the muscles of the pelvic floor.  A soft, flexible device that helps support the vaginal walls and keep pelvic organs in place (pessary). This is inserted into your vagina by your health care provider.  Surgery. This is often the only form of treatment for severe prolapse. Follow these instructions at home:  Avoid drinking beverages that contain caffeine or alcohol.  Increase your intake of high-fiber foods. This can help decrease constipation and straining during bowel movements.  Lose weight if recommended by your health care provider.  Wear a sanitary pad or adult diapers if you have urinary incontinence.  Avoid heavy lifting and straining with exercise and work. Do not hold your breath when you perform mild to moderate lifting and exercise activities. Limit your activities as directed by your health care provider.  Do Kegel exercises as directed by your health care provider. To do this: ? Squeeze your pelvic floor muscles tight. You should feel a tight lift in your rectal area and a tightness in your vaginal area. Keep your stomach, buttocks, and legs relaxed. ? Hold the muscles tight for up to 10 seconds. ? Relax your muscles. ? Repeat this exercise 50 times a day,   or as many times as told by your health care provider. Continue to do this exercise for at least 4-6 weeks, or for as long as told by your health care provider.  Take over-the-counter and  prescription medicines only as told by your health care provider.  If you have a pessary, take care of it as told by your health care provider.  Keep all follow-up visits as told by your health care provider. This is important. Contact a health care provider if you:  Have symptoms that interfere with your daily activities or sex life.  Need medicine to help with the discomfort.  Notice bleeding from your vagina that is not related to your period.  Have a fever.  Have pain or bleeding when you urinate.  Have bleeding when you pass stool.  Pass urine when you have sex.  Have chronic constipation.  Have a pessary that falls out.  Have bad smelling vaginal discharge.  Have an unusual, low pain in your abdomen. Summary  Pelvic organ prolapse is the stretching, bulging, or dropping of pelvic organs into an abnormal position. It happens when the muscles and tissues that surround and support pelvic structures become weak or stretched.  When organs other than the vagina are involved, they often bulge into the vagina or protrude from the vagina, depending on how severe the prolapse is.  In most cases, this condition needs to be treated only if it produces symptoms. Treatment may include lifestyle changes, estrogen, Kegel exercises, pessary insertion, or surgery.  Avoid heavy lifting and straining with exercise and work. Do not hold your breath when you perform mild to moderate lifting and exercise activities. Limit your activities as directed by your health care provider. This information is not intended to replace advice given to you by your health care provider. Make sure you discuss any questions you have with your health care provider. Document Released: 08/24/2013 Document Revised: 02/18/2017 Document Reviewed: 02/18/2017 Elsevier Interactive Patient Education  2019 [employer]  

## 2018-03-22 ENCOUNTER — Encounter: Payer: Self-pay | Admitting: Emergency Medicine

## 2018-03-22 ENCOUNTER — Inpatient Hospital Stay
Admission: EM | Admit: 2018-03-22 | Discharge: 2018-03-25 | DRG: 872 | Disposition: A | Discharge status: Home Health | Payer: Medicare Other | Attending: Specialist | Admitting: Specialist

## 2018-03-22 ENCOUNTER — Ambulatory Visit (INDEPENDENT_AMBULATORY_CARE_PROVIDER_SITE_OTHER): Payer: Medicare Other | Admitting: Obstetrics & Gynecology

## 2018-03-22 ENCOUNTER — Encounter: Payer: Self-pay | Admitting: Obstetrics & Gynecology

## 2018-03-22 ENCOUNTER — Emergency Department: Payer: Medicare Other

## 2018-03-22 ENCOUNTER — Other Ambulatory Visit: Payer: Self-pay

## 2018-03-22 VITALS — BP 150/80 | Ht 64.0 in | Wt 160.0 lb

## 2018-03-22 DIAGNOSIS — N3000 Acute cystitis without hematuria: Secondary | ICD-10-CM | POA: Diagnosis present

## 2018-03-22 DIAGNOSIS — M1990 Unspecified osteoarthritis, unspecified site: Secondary | ICD-10-CM | POA: Diagnosis present

## 2018-03-22 DIAGNOSIS — E785 Hyperlipidemia, unspecified: Secondary | ICD-10-CM | POA: Diagnosis present

## 2018-03-22 DIAGNOSIS — R062 Wheezing: Secondary | ICD-10-CM

## 2018-03-22 DIAGNOSIS — Z7982 Long term (current) use of aspirin: Secondary | ICD-10-CM

## 2018-03-22 DIAGNOSIS — E1142 Type 2 diabetes mellitus with diabetic polyneuropathy: Secondary | ICD-10-CM

## 2018-03-22 DIAGNOSIS — I10 Essential (primary) hypertension: Secondary | ICD-10-CM | POA: Diagnosis present

## 2018-03-22 DIAGNOSIS — B961 Klebsiella pneumoniae [K. pneumoniae] as the cause of diseases classified elsewhere: Secondary | ICD-10-CM | POA: Diagnosis present

## 2018-03-22 DIAGNOSIS — A4159 Other Gram-negative sepsis: Principal | ICD-10-CM | POA: Diagnosis present

## 2018-03-22 DIAGNOSIS — A419 Sepsis, unspecified organism: Secondary | ICD-10-CM | POA: Diagnosis present

## 2018-03-22 DIAGNOSIS — Z87440 Personal history of urinary (tract) infections: Secondary | ICD-10-CM | POA: Diagnosis not present

## 2018-03-22 DIAGNOSIS — N812 Incomplete uterovaginal prolapse: Secondary | ICD-10-CM

## 2018-03-22 DIAGNOSIS — E876 Hypokalemia: Secondary | ICD-10-CM | POA: Diagnosis present

## 2018-03-22 DIAGNOSIS — N8111 Cystocele, midline: Secondary | ICD-10-CM | POA: Diagnosis not present

## 2018-03-22 DIAGNOSIS — Z79899 Other long term (current) drug therapy: Secondary | ICD-10-CM | POA: Diagnosis not present

## 2018-03-22 DIAGNOSIS — Z7984 Long term (current) use of oral hypoglycemic drugs: Secondary | ICD-10-CM

## 2018-03-22 DIAGNOSIS — R55 Syncope and collapse: Secondary | ICD-10-CM

## 2018-03-22 DIAGNOSIS — G5603 Carpal tunnel syndrome, bilateral upper limbs: Secondary | ICD-10-CM | POA: Diagnosis present

## 2018-03-22 DIAGNOSIS — Z96653 Presence of artificial knee joint, bilateral: Secondary | ICD-10-CM | POA: Diagnosis present

## 2018-03-22 DIAGNOSIS — R7881 Bacteremia: Secondary | ICD-10-CM

## 2018-03-22 HISTORY — DX: Sepsis, unspecified organism: A41.9

## 2018-03-22 HISTORY — DX: Cystocele, unspecified: N81.10

## 2018-03-22 LAB — URINALYSIS, COMPLETE (UACMP) WITH MICROSCOPIC
Bilirubin Urine: NEGATIVE
Glucose, UA: >=500 mg/dL — AB
Ketones, ur: NEGATIVE mg/dL
NITRITE: NEGATIVE
Protein, ur: 100 mg/dL — AB
SPECIFIC GRAVITY, URINE: 1.013 (ref 1.005–1.030)
WBC, UA: >50 WBC/hpf — ABNORMAL HIGH (ref 0–5)
pH: 5 (ref 5.0–8.0)

## 2018-03-22 LAB — COMPREHENSIVE METABOLIC PANEL
ALBUMIN: 3.5 g/dL (ref 3.5–5.0)
ALK PHOS: 85 U/L (ref 38–126)
ALT: 20 U/L (ref 0–44)
ANION GAP: 10 (ref 5–15)
AST: 37 U/L (ref 15–41)
BUN: 12 mg/dL (ref 8–23)
CALCIUM: 8.4 mg/dL — AB (ref 8.9–10.3)
CO2: 26 mmol/L (ref 22–32)
Chloride: 98 mmol/L (ref 98–111)
Creatinine, Ser: 0.76 mg/dL (ref 0.44–1.00)
GFR calc Af Amer: >60 mL/min (ref >60)
GFR calc non Af Amer: >60 mL/min (ref >60)
GLUCOSE: 260 mg/dL — AB (ref 70–99)
Potassium: 3 mmol/L — ABNORMAL LOW (ref 3.5–5.1)
SODIUM: 134 mmol/L — AB (ref 135–145)
Total Bilirubin: 0.9 mg/dL (ref 0.3–1.2)
Total Protein: 7.4 g/dL (ref 6.5–8.1)

## 2018-03-22 LAB — CBC WITH DIFFERENTIAL/PLATELET
ABS IMMATURE GRANULOCYTES: 0.07 10*3/uL (ref 0.00–0.07)
BASOS ABS: 0 10*3/uL (ref 0.0–0.1)
Basophils Relative: 0 %
Eosinophils Absolute: 0 10*3/uL (ref 0.0–0.5)
Eosinophils Relative: 0 %
HCT: 34.6 % — ABNORMAL LOW (ref 36.0–46.0)
Hemoglobin: 11.1 g/dL — ABNORMAL LOW (ref 12.0–15.0)
Immature Granulocytes: 1 %
LYMPHS PCT: 7 %
Lymphs Abs: 1 10*3/uL (ref 0.7–4.0)
MCH: 30.2 pg (ref 26.0–34.0)
MCHC: 32.1 g/dL (ref 30.0–36.0)
MCV: 94.3 fL (ref 80.0–100.0)
Monocytes Absolute: 1.1 10*3/uL — ABNORMAL HIGH (ref 0.1–1.0)
Monocytes Relative: 8 %
NEUTROS ABS: 12 10*3/uL — AB (ref 1.7–7.7)
NRBC: 0 % (ref 0.0–0.2)
Neutrophils Relative %: 84 %
Platelets: 274 10*3/uL (ref 150–400)
RBC: 3.67 MIL/uL — AB (ref 3.87–5.11)
RDW: 12 % (ref 11.5–15.5)
WBC: 14.1 10*3/uL — AB (ref 4.0–10.5)

## 2018-03-22 LAB — LACTIC ACID, PLASMA: Lactic Acid, Venous: 1.6 mmol/L (ref 0.5–1.9)

## 2018-03-22 LAB — LIPASE, BLOOD: Lipase: 25 U/L (ref 11–51)

## 2018-03-22 LAB — INFLUENZA PANEL BY PCR (TYPE A & B)
INFLAPCR: NEGATIVE
INFLBPCR: NEGATIVE

## 2018-03-22 MED ORDER — SODIUM CHLORIDE 0.9 % IV BOLUS
500.0000 mL | Freq: Once | INTRAVENOUS | Status: AC
  Administered 2018-03-22: 500 mL via INTRAVENOUS

## 2018-03-22 MED ORDER — ACETAMINOPHEN 500 MG PO TABS
1000.0000 mg | ORAL_TABLET | ORAL | Status: AC
  Administered 2018-03-22: 1000 mg via ORAL
  Filled 2018-03-22: qty 2

## 2018-03-22 MED ORDER — SODIUM CHLORIDE 0.9 % IV SOLN
2.0000 g | Freq: Once | INTRAVENOUS | Status: AC
  Administered 2018-03-22: 2 g via INTRAVENOUS
  Filled 2018-03-22: qty 20

## 2018-03-22 MED ORDER — POTASSIUM CHLORIDE CRYS ER 20 MEQ PO TBCR
40.0000 meq | EXTENDED_RELEASE_TABLET | ORAL | Status: AC
  Administered 2018-03-23: 40 meq via ORAL
  Filled 2018-03-22: qty 2

## 2018-03-22 NOTE — ED Notes (Signed)
Pt given meal tray at this time. Admitting doctor aware.

## 2018-03-22 NOTE — Progress Notes (Signed)
HPI:      Ms. BELLAMAE BIGNELL is a 85 y.o. [id number] who presents today for her pessary follow up and examination related to her pelvic floor weakening.  Pt reports tolerating the pessary well with  no vaginal bleeding and  no vaginal discharge. She had one episode of urinary incontinence one morning when she first awoke and stood up, and afterwards felt relief of pressure and no further episodes that day or subsequent mornings. Symptoms of pelvic floor weakening have greatly improved. She is voiding and defecating without difficulty. She currently has a GELLHORN #3 pessary.  PMHx: She  has a past medical history of Carpal tunnel syndrome on both sides, Diabetes (HCC), Diabetic polyneuropathy (HCC), Diverticulitis, Hyperlipidemia, Hypertension, and Osteoarthritis. Also,  has a past surgical history that includes Ventral hernia repair; Cesarean section; Replacement total knee bilateral; pessary; and Ventral hernia repair (N/A, 01/13/2016)., family history includes Breast cancer in her maternal aunt; Breast cancer (age of onset: 77) in her mother.,  reports that she has never smoked. She has never used smokeless tobacco. She reports that she does not drink alcohol or use drugs.  She has a current medication list which includes the following prescription(s): acetaminophen, aspirin ec, glimepiride, glimepiride, lisinopril, lisinopril-hydrochlorothiazide, metformin, metformin, metoprolol succinate, metoprolol succinate, metoprolol tartrate, nystatin cream, simvastatin, and sitagliptin. Also, has No Known Allergies.  Review of Systems  All other systems reviewed and are negative.   Objective: BP (!) 150/80   Ht 5\' 4"  (1.626 m)   Wt 160 lb (72.6 kg)   BMI 27.46 kg/m  Physical Exam Constitutional:      General: She is not in acute distress.    Appearance: She is well-developed.  Genitourinary:     Pelvic exam was performed with patient supine.     Vagina and uterus normal.     No vaginal erythema or  bleeding.     No cervical motion tenderness, discharge, polyp or nabothian cyst.     Uterus is mobile.     Uterus is not enlarged.     No uterine mass detected.    Uterus is midaxial.     No right or left adnexal mass present.     Right adnexa not tender.     Left adnexa not tender.     Genitourinary Comments: PESSARY in PLACE appropriately  HENT:     Head: Normocephalic and atraumatic.     Nose: Nose normal.  Abdominal:     General: There is no distension.     Palpations: Abdomen is soft.     Tenderness: There is no abdominal tenderness.  Musculoskeletal: Normal range of motion.  Neurological:     Mental Status: She is alert and oriented to person, place, and time.     Cranial Nerves: No cranial nerve deficit.  Skin:    General: Skin is warm and dry.   A/P: Instructions given for care.  Cont to monitor for incontinence episodes, may have been an isolated event.  Surgery option disucssed and referral urogyne if necessary, not likely desired. Concerning symptoms to observe for are counseled to patient. Follow up scheduled for 3 months.  Importance for follow up discussed.  Prior history of long interval between appts and pessary erosion.  A total of 15 minutes were spent face-to-face with the patient during this encounter and over half of that time dealt with counseling and coordination of care.  Annamarie Major, MD, [facility], [facility] 03/22/2018  8:18 AM

## 2018-03-22 NOTE — ED Notes (Signed)
Resumed care from annie rn . Pt sleeping.  Family with pt   Pt waiting on admission bed.  Pt arousable.  nsr on monitor.

## 2018-03-22 NOTE — Progress Notes (Signed)
CODE SEPSIS - PHARMACY COMMUNICATION  **Broad Spectrum Antibiotics should be administered within 1 hour of Sepsis diagnosis**  Time Code Sepsis Called/Page Received: 2002  Antibiotics Ordered: Ceftriaxone  Time of 1st antibiotic administration: 2014  Additional action taken by pharmacy: n/a  If necessary, Name of Provider/Nurse Contacted: n/a   Orinda Kenner ,PharmD Clinical Pharmacist  03/22/2018  8:11 PM

## 2018-03-22 NOTE — H&P (Signed)
[facility] - [employer] [facility]   PATIENT NAME: [name]    MR#:  [id number]  DATE OF BIRTH:  01-07-1934  DATE OF ADMISSION:  03/22/2018  PRIMARY CARE PHYSICIAN: Leotis ShamesSingh, Jasmine, MD   REQUESTING/REFERRING PHYSICIAN: Dr. Sharyn CreamerMark Quale  CHIEF COMPLAINT:   Chief Complaint  Patient presents with  . Dizziness    HISTORY OF PRESENT ILLNESS:  [name]  is a 85 y.o. female with a known history of non-insulin-dependent diabetes mellitus, hypertension, osteoarthritis, vaginal prolapse presents to hospital secondary to worsening abdominal pain and fevers. Patient was having with vaginal prolapse symptoms for the last week, seen OB/GYN and had a pessary put in 5 days ago.  She started doing well up until the last couple of days when she started having lower abdominal pain and some clear vaginal discharge.  Denies any fevers or chills up until today.  Complains of nausea but no vomiting.  No recent travel, no sick contacts. She had a fever of 103 F in the emergency room, elevated WBC and blood sugars here.  Urine analysis consistent with significant UTI.  Patient is being admitted for sepsis.  PAST MEDICAL HISTORY:   Past Medical History:  Diagnosis Date  . Carpal tunnel syndrome on both sides   . Diabetes (HCC)   . Diabetic polyneuropathy (HCC)   . Diverticulitis   . Hyperlipidemia   . Hypertension   . Osteoarthritis   . Vaginal prolapse     PAST SURGICAL HISTORY:   Past Surgical History:  Procedure Laterality Date  . CESAREAN SECTION    . pessary    . REPLACEMENT TOTAL KNEE BILATERAL    . VENTRAL HERNIA REPAIR    . VENTRAL HERNIA REPAIR N/A 01/13/2016   Procedure: HERNIA REPAIR VENTRAL ADULT WITH SMALL BOWEL RESECTION, LYSIS OF ADHESIONS;  Surgeon: Gladis Riffleatherine L Loflin, MD;  Location: [facility] ORS;  Service: General;  Laterality: N/A;    SOCIAL HISTORY:   Social History   Tobacco Use  . Smoking status: Never Smoker  . Smokeless tobacco: Never  Used  Substance Use Topics  . Alcohol use: No    FAMILY HISTORY:   Family History  Problem Relation Age of Onset  . Breast cancer Mother 2691  . Breast cancer Maternal Aunt     DRUG ALLERGIES:  No Known Allergies  REVIEW OF SYSTEMS:   Review of Systems  Constitutional: Positive for chills and fever. Negative for malaise/fatigue and weight loss.  HENT: Negative for ear discharge, ear pain, hearing loss, nosebleeds and tinnitus.   Eyes: Negative for blurred vision, double vision and photophobia.  Respiratory: Negative for cough, hemoptysis, shortness of breath and wheezing.   Cardiovascular: Negative for chest pain, palpitations, orthopnea and leg swelling.  Gastrointestinal: Positive for abdominal pain and nausea. Negative for constipation, diarrhea, heartburn, melena and vomiting.  Genitourinary: Positive for frequency. Negative for dysuria, hematuria and urgency.  Musculoskeletal: Negative for back pain, myalgias and neck pain.  Skin: Negative for rash.  Neurological: Negative for dizziness, tingling, tremors, sensory change, speech change, focal weakness and headaches.  Endo/Heme/Allergies: Does not bruise/bleed easily.  Psychiatric/Behavioral: Negative for depression.    MEDICATIONS AT HOME:   Prior to Admission medications   Medication Sig Start Date End Date Taking? Authorizing Provider  aspirin EC 81 MG tablet Take 81 mg by mouth daily.   Yes [provider]  glimepiride (AMARYL) 2 MG tablet Take 1 mg by mouth daily.  04/27/15  Yes [provider]  lisinopril-hydrochlorothiazide (PRINZIDE,ZESTORETIC) 10-12.5 MG tablet Take 1 tablet by mouth daily.  07/27/17 07/27/18 Yes [provider]  metFORMIN (GLUCOPHAGE) 500 MG tablet Take 500 mg by mouth 2 (two) times daily with a meal.   Yes [provider]  naproxen sodium (ALEVE) 220 MG tablet Take 220-440 mg by mouth 2 (two) times daily as needed (pain).   Yes [provider]    simvastatin (ZOCOR) 40 MG tablet Take 40 mg by mouth at bedtime.  10/16/15  Yes [provider]  sitaGLIPtin (JANUVIA) 100 MG tablet Take 100 mg by mouth daily.   Yes [provider]  acetaminophen (TYLENOL) 325 MG tablet Take 2 tablets (650 mg total) by mouth every 6 (six) hours as needed for mild pain, moderate pain, fever or headache. 01/25/16   Loflin, Laney Potash, MD  nystatin cream (MYCOSTATIN) Apply topically 3 (three) times daily. Patient not taking: Reported on 02/28/2017 01/25/16   Gladis Riffle, MD      VITAL SIGNS:  Blood pressure 126/61, pulse 94, temperature 99.8 F (37.7 C), temperature source Oral, resp. rate 19, height 5\' 4"  (1.626 m), weight 72.6 kg, SpO2 100 %.  PHYSICAL EXAMINATION:   Physical Exam  GENERAL:  85 y.o.-year-old elderly patient lying in the bed with no acute distress.  EYES: Pupils equal, round, reactive to light and accommodation. No scleral icterus. Extraocular muscles intact.  HEENT: Head atraumatic, normocephalic. Oropharynx and nasopharynx clear.  NECK:  Supple, no jugular venous distention. No thyroid enlargement, no tenderness.  LUNGS: Normal breath sounds bilaterally, no wheezing, rales,rhonchi or crepitation. No use of accessory muscles of respiration. Decreased bibasilar breath sounds CARDIOVASCULAR: S1, S2 normal. No  rubs, or gallops. 2/6 systolic murmur present ABDOMEN: Soft, distended, but nontender. Bowel sounds present. No organomegaly or mass.  EXTREMITIES: No pedal edema, cyanosis, or clubbing.  NEUROLOGIC: Cranial nerves II through XII are intact. Muscle strength 5/5 in all extremities. Sensation intact. Gait not checked.  PSYCHIATRIC: The patient is alert and oriented x 3.  SKIN: No obvious rash, lesion, or ulcer.   LABORATORY PANEL:   CBC Recent Labs  Lab 03/22/18 1755  WBC 14.1*  HGB 11.1*  HCT 34.6*  PLT 274    ------------------------------------------------------------------------------------------------------------------  Chemistries  Recent Labs  Lab 03/22/18 1755  NA 134*  K 3.0*  CL 98  CO2 26  GLUCOSE 260*  BUN 12  CREATININE 0.76  CALCIUM 8.4*  AST 37  ALT 20  ALKPHOS 85  BILITOT 0.9   ------------------------------------------------------------------------------------------------------------------  Cardiac Enzymes No results for input(s): TROPONINI in the last 168 hours. ------------------------------------------------------------------------------------------------------------------  RADIOLOGY:  Dg Chest 2 View  Result Date: 03/22/2018 CLINICAL DATA:  Fever workup EXAM: CHEST - 2 VIEW COMPARISON:  01/21/2016 FINDINGS: No pleural effusion. Streaky atelectasis at the bases. Mild cardiomegaly. No pneumothorax. Advanced degenerative changes of both shoulders. IMPRESSION: No active cardiopulmonary disease.  Mild cardiomegaly Electronically Signed   By: Jasmine Pang M.D.   On: 03/22/2018 19:13    EKG:   Orders placed or performed during the hospital encounter of 03/22/18  . EKG 12-Lead  . EKG 12-Lead  . ED EKG  . ED EKG    IMPRESSION AND PLAN:   Nhyla Wilmouth  is a 85 y.o. female with a known history of non-insulin-dependent diabetes mellitus, hypertension, osteoarthritis, vaginal prolapse presents to hospital secondary to worsening abdominal pain and fevers  1.  Sepsis-secondary to acute cystitis -Could have been triggered secondary to recent GYN procedure. -Sent for blood and urine  cultures given high fevers. -Started on Rocephin -IV fluids and monitor.  2.  Diabetes mellitus with hyperglycemia-continue home medications.  Patient on oral Metformin, Januvia which is substituted to Tradjenta here and also on Amaryl.  Sliding scale insulin added  3.  Hyperlipidemia-statin  4.  Hypokalemia-being replaced  5. DVT prophylaxis- lovenox  PT consulted.    All  the records are reviewed and case discussed with ED provider. Management plans discussed with the patient, family and they are in agreement.  CODE STATUS: Full Code  TOTAL TIME TAKING CARE OF THIS PATIENT: 51 minutes.    Enid Baas M.D on 03/22/2018 at 9:04 PM  Between 7am to 6pm - Pager - [phone]  After 6pm go to [email or web address] - Social research officer, government  [facility]  Office  [phone]  CC: Primary care physician; Leotis Shames, MD

## 2018-03-22 NOTE — ED Provider Notes (Signed)
[facility] Emergency Department Provider Note ____________________________________________   First MD Initiated Contact with Patient 03/22/18 1816     (approximate)  I have reviewed the triage vital signs and the nursing notes.   HISTORY  Chief Complaint Dizziness  HPI [name] is a 85 y.o. female with a history of diabetes, diverticulitis hypertension  Patient presents for evaluation of an episode where she was witnessed to pass out while seated in a chair.  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  Reports he has not been feeling well today, cannot describe it too well but just feeling like chills achy hot.  Son reports she has a history of frequent urinary tract infections  Denies headache.  No neck pain.  No nausea vomiting.  No shortness of breath or cough.  Past Medical History:  Diagnosis Date  . Carpal tunnel syndrome on both sides   . Diabetes (HCC)   . Diabetic polyneuropathy (HCC)   . Diverticulitis   . Hyperlipidemia   . Hypertension   . Osteoarthritis     Patient Active Problem List   Diagnosis Date Noted  . Cystocele, midline 09/04/2017  . Incomplete uterine prolapse 09/04/2017  . Malnutrition of moderate degree 01/15/2016  . Dehydration   . Incarcerated ventral hernia   . Diabetic ketoacidosis without coma associated with type 2 diabetes mellitus (HCC)   . Type 2 diabetes mellitus with polyneuropathy (HCC) 08/06/2015  . Carpal tunnel syndrome, bilateral 07/10/2015  . Osteopenia of multiple sites 07/10/2015  . Arthralgia of multiple sites 06/27/2015  . Mixed hyperlipidemia 11/06/2014  . Hypomagnesemia 11/02/2013  . Essential hypertension 11/02/2013    Past Surgical History:  Procedure Laterality Date  . CESAREAN SECTION    . pessary    . REPLACEMENT  TOTAL KNEE BILATERAL    . VENTRAL HERNIA REPAIR    . VENTRAL HERNIA REPAIR N/A 01/13/2016   Procedure: HERNIA REPAIR VENTRAL ADULT WITH SMALL BOWEL RESECTION, LYSIS OF ADHESIONS;  Surgeon: Gladis Riffleatherine L Loflin, MD;  Location: [facility] ORS;  Service: General;  Laterality: N/A;    Prior to Admission medications   Medication Sig Start Date End Date Taking? Authorizing Provider  acetaminophen (TYLENOL) 325 MG tablet Take 2 tablets (650 mg total) by mouth every 6 (six) hours as needed for mild pain, moderate pain, fever or headache. 01/25/16   Gladis RiffleLoflin, Catherine L, MD  aspirin EC 81 MG tablet Take 81 mg by mouth daily.    [provider]  glimepiride (AMARYL) 2 MG tablet Take 1 mg by mouth daily.  04/27/15   [provider]  glimepiride (AMARYL) 2 MG tablet Take by mouth. 10/14/16   [provider]  lisinopril (PRINIVIL,ZESTRIL) 5 MG tablet Take 5 mg by mouth daily.     [provider]  lisinopril-hydrochlorothiazide (PRINZIDE,ZESTORETIC) 10-12.5 MG tablet Take by mouth. 07/27/17 07/27/18  [provider]  metFORMIN (GLUCOPHAGE) 500 MG tablet Take 500 mg by mouth 2 (two) times daily with a meal.    [provider]  metFORMIN (GLUCOPHAGE) 500 MG tablet Take by mouth. 07/28/17 07/28/18  [provider]  metoprolol succinate (TOPROL-XL) 25 MG 24 hr tablet Take 12.5 mg by mouth 2 (two) times daily.    [provider]  metoprolol succinate (TOPROL-XL) 25 MG 24 hr tablet  Take by mouth.    [provider]  metoprolol tartrate (LOPRESSOR) 25 MG tablet Take 0.5 tablets (12.5 mg total) by mouth 2 (two) times daily. 01/25/16   Gladis Riffle, MD  nystatin cream (MYCOSTATIN) Apply topically 3 (three) times daily. Patient not taking: Reported on 02/28/2017 01/25/16   Gladis Riffle, MD  simvastatin (ZOCOR) 40 MG tablet Take 40 mg by mouth daily. 10/16/15   [provider]  sitaGLIPtin (JANUVIA) 100 MG tablet Take 100 mg by mouth  daily.    [provider]    Allergies Patient has no known allergies.  Family History  Problem Relation Age of Onset  . Breast cancer Mother 39  . Breast cancer Maternal Aunt     Social History Social History   Tobacco Use  . Smoking status: Never Smoker  . Smokeless tobacco: Never Used  Substance Use Topics  . Alcohol use: No  . Drug use: No    Review of Systems Constitutional: Feels like she has been having fevers and chills throughout the day today Eyes: No visual changes. ENT: No sore throat. Cardiovascular: Denies chest pain. Respiratory: Denies shortness of breath. Gastrointestinal: No abdominal pain.   Genitourinary: Negative for dysuria. Musculoskeletal: Negative for back pain. Skin: Negative for rash. Neurological: Negative for headaches, areas of focal weakness or numbness.    ____________________________________________   PHYSICAL EXAM:  VITAL SIGNS: ED Triage Vitals  Enc Vitals Group     BP 03/22/18 1745 (!) 183/77     Pulse Rate 03/22/18 1745 100     Resp 03/22/18 1745 (!) 26     Temp 03/22/18 1739 (!) 101.2 F (38.4 C)     Temp Source 03/22/18 1739 Oral     SpO2 03/22/18 1745 99 %     Weight 03/22/18 1740 160 lb (72.6 kg)     Height 03/22/18 1740 5\' 4"  (1.626 m)     Head Circumference --      Peak Flow --      Pain Score 03/22/18 1740 0     Pain Loc --      Pain Edu? --      Excl. in GC? --     Constitutional: Alert and oriented.  Mildly ill-appearing but in no acute distress. Eyes: Conjunctivae are normal. Head: Atraumatic. Nose: No congestion/rhinnorhea. Mouth/Throat: Mucous membranes are moist. Neck: No stridor.  Cardiovascular: Normal rate, regular rhythm. Grossly normal heart sounds.  Good peripheral circulation. Respiratory: Normal respiratory effort.  No retractions. Lungs CTAB. Gastrointestinal: Soft and nontender. No distention. Musculoskeletal: No lower extremity tenderness nor edema. Neurologic:  Normal speech  and language. No gross focal neurologic deficits are appreciated.  Skin:  Skin is warm, dry and intact. No rash noted. Psychiatric: Mood and affect are normal. Speech and behavior are normal.  ____________________________________________   LABS (all labs ordered are listed, but only abnormal results are displayed)  Labs Reviewed  CBC WITH DIFFERENTIAL/PLATELET - Abnormal; Notable for the following components:      Result Value   WBC 14.1 (*)    RBC 3.67 (*)    Hemoglobin 11.1 (*)    HCT 34.6 (*)    Neutro Abs 12.0 (*)    Monocytes Absolute 1.1 (*)    All other components within normal limits  COMPREHENSIVE METABOLIC PANEL - Abnormal; Notable for the following components:   Sodium 134 (*)    Potassium 3.0 (*)    Glucose, Bld 260 (*)    Calcium 8.4 (*)  All other components within normal limits  CULTURE, BLOOD (ROUTINE X 2)  CULTURE, BLOOD (ROUTINE X 2)  URINE CULTURE  LIPASE, BLOOD  INFLUENZA PANEL BY PCR (TYPE A & B)  LACTIC ACID, PLASMA  URINALYSIS, COMPLETE (UACMP) WITH MICROSCOPIC   ____________________________________________  EKG  Reviewed by me at 1745 Heart rate 100 QRS 90 QTc 460 Normal sinus rhythm, no evidence acute ischemia ____________________________________________  RADIOLOGY  Dg Chest 2 View  Result Date: 03/22/2018 CLINICAL DATA:  Fever workup EXAM: CHEST - 2 VIEW COMPARISON:  01/21/2016 FINDINGS: No pleural effusion. Streaky atelectasis at the bases. Mild cardiomegaly. No pneumothorax. Advanced degenerative changes of both shoulders. IMPRESSION: No active cardiopulmonary disease.  Mild cardiomegaly Electronically Signed   By: Jasmine PangKim  Fujinaga M.D.   On: 03/22/2018 19:13    Chest x-ray negative for acute ____________________________________________   PROCEDURES  Procedure(s) performed: None  Procedures  Critical Care performed: No  ____________________________________________   INITIAL IMPRESSION / ASSESSMENT AND PLAN / ED  COURSE  Pertinent labs & imaging results that were available during my care of the patient were reviewed by me and considered in my medical decision making (see chart for details).   Patient presents after a witnessed syncopal episode, not feeling well today noted to be febrile.  She is alert fully oriented neurologically intact.  No head injury, she was lowered to the ground and was witnessed.  However, she has elevated white count, febrile, flu test and chest x-ray negative.  Denies in symptoms such as pulmonary or cardiac symptoms.  No headache.  No neck pain or stiffness.  No rash.  Has a history of frequent UTIs, due to a high concern for sepsis will cover empirically with Rocephin as we await further work-up including urinalysis.  Discussed with the patient, she will require admission and patient is in agreement with this plan.  Case discussed with Dr. Nemiah CommanderKalisetti who will follow-up on urinalysis, further work-up and hospitalization.      ____________________________________________   FINAL CLINICAL IMPRESSION(S) / ED DIAGNOSES  Final diagnoses:  Sepsis, due to unspecified organism, unspecified whether acute organ dysfunction present [facility](HCC)  Syncope and collapse        Note:  This document was prepared using Dragon voice recognition software and may include unintentional dictation errors       Sharyn CreamerQuale, Mark, MD 03/22/18 1958

## 2018-03-22 NOTE — ED Triage Notes (Signed)
Pt presents to ed from work via [employer] with c/o dizziness. Bystander states pt was placed into chair when she began feeling dizzy and slid out of chair and fell to ground. Pt still confused at this time in regards to fall at this time. Bystanders state pt was assisted to ground and deny any LOC or hitting head. Blood sugar 369. hx of htn. sinus tach on monitor for ems. Pt is currently alert and oriented x4.

## 2018-03-23 ENCOUNTER — Inpatient Hospital Stay: Payer: Medicare Other

## 2018-03-23 LAB — BLOOD CULTURE ID PANEL (REFLEXED)

## 2018-03-23 LAB — CBC
HCT: 30.6 % — ABNORMAL LOW (ref 36.0–46.0)
Hemoglobin: 9.8 g/dL — ABNORMAL LOW (ref 12.0–15.0)
MCH: 30.3 pg (ref 26.0–34.0)
MCHC: 32 g/dL (ref 30.0–36.0)
MCV: 94.7 fL (ref 80.0–100.0)
PLATELETS: 219 10*3/uL (ref 150–400)
RBC: 3.23 MIL/uL — ABNORMAL LOW (ref 3.87–5.11)
RDW: 12.1 % (ref 11.5–15.5)
WBC: 20.7 10*3/uL — ABNORMAL HIGH (ref 4.0–10.5)
nRBC: 0 % (ref 0.0–0.2)

## 2018-03-23 LAB — BASIC METABOLIC PANEL
Anion gap: 9 (ref 5–15)
BUN: 15 mg/dL (ref 8–23)
CO2: 25 mmol/L (ref 22–32)
Calcium: 7.7 mg/dL — ABNORMAL LOW (ref 8.9–10.3)
Chloride: 104 mmol/L (ref 98–111)
Creatinine, Ser: 0.8 mg/dL (ref 0.44–1.00)
GFR calc Af Amer: >60 mL/min (ref >60)
GFR calc non Af Amer: >60 mL/min (ref >60)
Glucose, Bld: 249 mg/dL — ABNORMAL HIGH (ref 70–99)
Potassium: 3.1 mmol/L — ABNORMAL LOW (ref 3.5–5.1)
Sodium: 138 mmol/L (ref 135–145)

## 2018-03-23 LAB — GLUCOSE, CAPILLARY
GLUCOSE-CAPILLARY: 120 mg/dL — AB (ref 70–99)
Glucose-Capillary: 263 mg/dL — ABNORMAL HIGH (ref 70–99)
Glucose-Capillary: 213 mg/dL — ABNORMAL HIGH (ref 70–99)
Glucose-Capillary: 169 mg/dL — ABNORMAL HIGH (ref 70–99)
Glucose-Capillary: 135 mg/dL — ABNORMAL HIGH (ref 70–99)

## 2018-03-23 MED ORDER — IPRATROPIUM-ALBUTEROL 0.5-2.5 (3) MG/3ML IN SOLN
RESPIRATORY_TRACT | Status: AC
  Administered 2018-03-23: 02:00:00
  Filled 2018-03-23: qty 3

## 2018-03-23 MED ORDER — FUROSEMIDE 10 MG/ML IJ SOLN
20.0000 mg | Freq: Once | INTRAMUSCULAR | Status: AC
  Administered 2018-03-23: 20 mg via INTRAVENOUS

## 2018-03-23 MED ORDER — SODIUM CHLORIDE 0.9 % IV SOLN: 1.0000 g | INTRAVENOUS | Status: DC

## 2018-03-23 MED ORDER — LABETALOL HCL 5 MG/ML IV SOLN
10.0000 mg | INTRAVENOUS | Status: DC | PRN
  Administered 2018-03-23 (×2): 10 mg via INTRAVENOUS
  Filled 2018-03-23: qty 4

## 2018-03-23 MED ORDER — NITROGLYCERIN 2 % TD OINT
0.5000 [in_us] | TOPICAL_OINTMENT | Freq: Once | TRANSDERMAL | Status: DC
  Filled 2018-03-23: qty 1

## 2018-03-23 MED ORDER — SODIUM CHLORIDE 0.9 % IV SOLN
1.0000 g | Freq: Two times a day (BID) | INTRAVENOUS | Status: DC
  Filled 2018-03-23 (×2): qty 1

## 2018-03-23 MED ORDER — INSULIN ASPART 100 UNIT/ML ~~LOC~~ SOLN
0.0000 [IU] | Freq: Three times a day (TID) | SUBCUTANEOUS | Status: DC
  Administered 2018-03-23: 3 [IU] via SUBCUTANEOUS
  Administered 2018-03-23 (×2): 2 [IU] via SUBCUTANEOUS
  Filled 2018-03-23 (×4): qty 1

## 2018-03-23 MED ORDER — ACETAMINOPHEN 500 MG PO TABS
ORAL_TABLET | ORAL | Status: AC
  Filled 2018-03-23: qty 2

## 2018-03-23 MED ORDER — GLIMEPIRIDE 1 MG PO TABS
1.0000 mg | ORAL_TABLET | Freq: Every day | ORAL | Status: DC
  Administered 2018-03-23: 1 mg via ORAL
  Filled 2018-03-23 (×2): qty 1

## 2018-03-23 MED ORDER — LINAGLIPTIN 5 MG PO TABS
5.0000 mg | ORAL_TABLET | Freq: Every day | ORAL | Status: DC
  Administered 2018-03-23 – 2018-03-25 (×3): 5 mg via ORAL
  Filled 2018-03-23 (×3): qty 1

## 2018-03-23 MED ORDER — LABETALOL HCL 5 MG/ML IV SOLN
INTRAVENOUS | Status: AC
  Filled 2018-03-23: qty 4

## 2018-03-23 MED ORDER — SODIUM CHLORIDE 0.9 % IV SOLN
2.0000 g | INTRAVENOUS | Status: DC
  Filled 2018-03-23: qty 20

## 2018-03-23 MED ORDER — SIMVASTATIN 10 MG PO TABS: 40.0000 mg | ORAL_TABLET | Freq: Every day | ORAL | Status: DC

## 2018-03-23 MED ORDER — SIMVASTATIN 20 MG PO TABS
40.0000 mg | ORAL_TABLET | Freq: Every day | ORAL | Status: DC
  Administered 2018-03-23 – 2018-03-24 (×2): 40 mg via ORAL
  Filled 2018-03-23 (×2): qty 2

## 2018-03-23 MED ORDER — SODIUM CHLORIDE 0.9 % IV SOLN
INTRAVENOUS | Status: DC
  Administered 2018-03-23: 03:00:00 via INTRAVENOUS

## 2018-03-23 MED ORDER — ONDANSETRON HCL 4 MG PO TABS: 4.0000 mg | ORAL_TABLET | Freq: Four times a day (QID) | ORAL | Status: DC | PRN

## 2018-03-23 MED ORDER — METFORMIN HCL 500 MG PO TABS
500.0000 mg | ORAL_TABLET | Freq: Two times a day (BID) | ORAL | Status: DC
  Administered 2018-03-23 – 2018-03-25 (×5): 500 mg via ORAL
  Filled 2018-03-23 (×5): qty 1

## 2018-03-23 MED ORDER — FUROSEMIDE 10 MG/ML IJ SOLN
INTRAMUSCULAR | Status: AC
  Filled 2018-03-23: qty 4

## 2018-03-23 MED ORDER — ACETAMINOPHEN 325 MG PO TABS: 650.0000 mg | ORAL_TABLET | Freq: Once | ORAL | Status: DC

## 2018-03-23 MED ORDER — POTASSIUM CHLORIDE CRYS ER 20 MEQ PO TBCR
40.0000 meq | EXTENDED_RELEASE_TABLET | Freq: Once | ORAL | Status: AC
  Administered 2018-03-23: 40 meq via ORAL
  Filled 2018-03-23: qty 2

## 2018-03-23 MED ORDER — INSULIN ASPART 100 UNIT/ML ~~LOC~~ SOLN: 0.0000 [IU] | Freq: Every day | SUBCUTANEOUS | Status: DC

## 2018-03-23 MED ORDER — SODIUM CHLORIDE 0.9 % IV SOLN
1.0000 g | Freq: Three times a day (TID) | INTRAVENOUS | Status: DC
  Administered 2018-03-23 – 2018-03-24 (×4): 1 g via INTRAVENOUS
  Filled 2018-03-23 (×5): qty 1

## 2018-03-23 MED ORDER — NITROGLYCERIN 2 % TD OINT
1.0000 [in_us] | TOPICAL_OINTMENT | Freq: Once | TRANSDERMAL | Status: AC
  Administered 2018-03-23: 1 [in_us] via TOPICAL

## 2018-03-23 MED ORDER — ENOXAPARIN SODIUM 40 MG/0.4ML ~~LOC~~ SOLN
40.0000 mg | SUBCUTANEOUS | Status: DC
  Administered 2018-03-23 – 2018-03-25 (×3): 40 mg via SUBCUTANEOUS
  Filled 2018-03-23 (×3): qty 0.4

## 2018-03-23 MED ORDER — ASPIRIN EC 81 MG PO TBEC
81.0000 mg | DELAYED_RELEASE_TABLET | Freq: Every day | ORAL | Status: DC
  Administered 2018-03-23 – 2018-03-25 (×3): 81 mg via ORAL
  Filled 2018-03-23 (×3): qty 1

## 2018-03-23 MED ORDER — ACETAMINOPHEN 325 MG PO TABS
650.0000 mg | ORAL_TABLET | Freq: Four times a day (QID) | ORAL | Status: DC | PRN
  Administered 2018-03-23: 650 mg via ORAL
  Filled 2018-03-23 (×2): qty 2

## 2018-03-23 MED ORDER — ONDANSETRON HCL 4 MG/2ML IJ SOLN: 4.0000 mg | Freq: Four times a day (QID) | INTRAMUSCULAR | Status: DC | PRN

## 2018-03-23 NOTE — Progress Notes (Signed)
[facility] - [place] [facility]     PATIENT NAME: [name]    MR#:  [id number]  DATE OF BIRTH:  07/27/1933  SUBJECTIVE:   Pt. Here due to weakness and chills, abdominal pain and noted to have sepsis due to UTI.  Patient still having some fevers and some intermittent nausea but no vomiting.  Patient's daughter is at bedside.  REVIEW OF SYSTEMS:    Review of Systems  Constitutional: Positive for fever. Negative for chills.  HENT: Negative for congestion and tinnitus.   Eyes: Negative for blurred vision and double vision.  Respiratory: Negative for cough, shortness of breath and wheezing.   Cardiovascular: Negative for chest pain, orthopnea and PND.  Gastrointestinal: Positive for nausea. Negative for abdominal pain, diarrhea and vomiting.  Genitourinary: Negative for dysuria and hematuria.  Neurological: Negative for dizziness, sensory change and focal weakness.  All other systems reviewed and are negative.   Nutrition: Heart healthy/Carb control Tolerating Diet: yes Tolerating PT: Await Eval.    DRUG ALLERGIES:  No Known Allergies  VITALS:  Blood pressure 124/66, pulse 79, temperature (!) 101.3 F (38.5 C), temperature source Oral, resp. rate 19, height 5' 4" (1.626 m), weight 74.8 kg, SpO2 100 %.  PHYSICAL EXAMINATION:   Physical Exam  GENERAL:  85 y.o.-year-old patient lying in bed in no acute distress.  EYES: Pupils equal, round, reactive to light and accommodation. No scleral icterus. Extraocular muscles intact.  HEENT: Head atraumatic, normocephalic. Oropharynx and nasopharynx clear.  NECK:  Supple, no jugular venous distention. No thyroid enlargement, no tenderness.  LUNGS: Normal breath sounds bilaterally, no wheezing, rales, rhonchi. No use of accessory muscles of respiration.  CARDIOVASCULAR: S1, S2 normal. No murmurs, rubs, or gallops.  ABDOMEN: Soft, nontender, nondistended. Bowel sounds present. No organomegaly or mass.    EXTREMITIES: No cyanosis, clubbing or edema b/l.    NEUROLOGIC: Cranial nerves II through XII are intact. No focal Motor or sensory deficits b/l. Globally weak.   PSYCHIATRIC: The patient is alert and oriented x 3.  SKIN: No obvious rash, lesion, or ulcer.    LABORATORY PANEL:   CBC Recent Labs  Lab 03/23/18 0515  WBC 20.7*  HGB 9.8*  HCT 30.6*  PLT 219   ------------------------------------------------------------------------------------------------------------------  Chemistries  Recent Labs  Lab 03/22/18 1755 03/23/18 0515  NA 134* 138  K 3.0* 3.1*  CL 98 104  CO2 26 25  GLUCOSE 260* 249*  BUN 12 15  CREATININE 0.76 0.80  CALCIUM 8.4* 7.7*  AST 37  --   ALT 20  --   ALKPHOS 85  --   BILITOT 0.9  --    ------------------------------------------------------------------------------------------------------------------  Cardiac Enzymes No results for input(s): TROPONINI in the last 168 hours. ------------------------------------------------------------------------------------------------------------------  RADIOLOGY:  Dg Neck Soft Tissue  Result Date: 03/23/2018 CLINICAL DATA:  Shortness of Breath EXAM: NECK SOFT TISSUES - 1+ VIEW COMPARISON:  None FINDINGS: Epiglottis and aryepiglottic folds are normal. Airway is patent. Retropharyngeal soft tissues are normal. Advanced degenerative changes in the cervical spine. IMPRESSION: No acute findings. Electronically Signed   By: Kevin  Dover M.D.   On: 03/23/2018 02:23   Dg Chest 2 View  Result Date: 03/22/2018 CLINICAL DATA:  Fever workup EXAM: CHEST - 2 VIEW COMPARISON:  01/21/2016 FINDINGS: No pleural effusion. Streaky atelectasis at the bases. Mild cardiomegaly. No pneumothorax. Advanced degenerative changes of both shoulders. IMPRESSION: No active cardiopulmonary disease.  Mild cardiomegaly Electronically Signed   By: [name]  Fujinaga M.D.     On: 03/22/2018 19:13   Dg Chest Portable 1 View  Result Date:  03/23/2018 CLINICAL DATA:  Shortness of Breath EXAM: PORTABLE CHEST 1 VIEW COMPARISON:  03/22/2018 FINDINGS: Heart is borderline in size. No confluent airspace opacities, effusions or edema. No acute bony abnormality. Advanced degenerative changes in the shoulders bilaterally. IMPRESSION: No active disease. Electronically Signed   By: Rolm Baptise M.D.   On: 03/23/2018 02:23     ASSESSMENT AND PLAN:   85 year old female with past medical history of Hypertension, hyperlipidemia, osteoarthritis, diabetes, diabetic neuropathy who presented to the hospital due to fever, chills, nausea and noted to have sepsis secondary to UTI.  1.  Sepsis-patient met criteria given her fever, leukocytosis, abnormal urinalysis. - Patient's blood cultures are positive for Klebsiella, will change antibiotics from IV ceftriaxone to meropenem.  Continue IV fluids, antipyretics and supportive care.    2.  Urinary tract infection- source of patient's sepsis - Changed from IV ceftriaxone to IV meropenem.  Await urine culture results. - will get Renal US  3.  Leukocytosis-secondary to sepsis.  Follow with IV antibiotic therapy.  4.  Diabetes type 2 without complication-continue sliding scale insulin, Amaryl, Metformin - cont. Tradjenta  5. Hypokalemia - will cont. To replace potassium and repeat in a.m.  - check Mg. Level.   6. Hyperlipidemia - cont. Simvastatin    All the records are reviewed and case discussed with Care Management/Social Worker. Management plans discussed with the patient, family and they are in agreement.  CODE STATUS: Full code  DVT Prophylaxis: Lovenox  TOTAL TIME TAKING CARE OF THIS PATIENT: 30 minutes.   POSSIBLE D/C IN 1-2 DAYS, DEPENDING ON CLINICAL CONDITION.   Henreitta Leber M.D on 03/23/2018 at 12:15 PM  Between 7am to 6pm - Pager - [phone]  After 6pm go to [email or web address] - Proofreader  [facility] [place] [facility]  Office   [phone]  CC: Primary care physician; Glendon Axe, MD

## 2018-03-23 NOTE — Progress Notes (Signed)
PHARMACY - PHYSICIAN COMMUNICATION CRITICAL VALUE ALERT - BLOOD CULTURE IDENTIFICATION (BCID)  [name] is an 85 y.o. female who presented to [facility] on 03/22/2018 with a chief complaint of dizziness  Assessment: Tmax 103.6F, HR 100's, RR 34 on arrival, WBC 14.1 >> 20.7, UA leuko +, nitrite +, bacteria many, 4/4 GNR BCID Enterobacteriaceae Klebsiella pneumoniaea KPC -   Name of physician (or Provider) Contacted: Joycelyn Rua  Current antibiotics: Ceftriaxone 1g IV daily  Changes to prescribed antibiotics recommended:  Recommendations accepted by provider -- Will increase ceftriaxone to 2g IV daily to start 02/11 @ 2000 (patient already received a one time dose of ceftriaxone 2g IV x 1 in ED)  Results for orders placed or performed during the hospital encounter of 03/22/18  Blood Culture ID Panel (Reflexed) (Collected: 03/22/2018  5:55 PM)  Result Value Ref Range   Enterococcus species NOT DETECTED NOT DETECTED   Listeria monocytogenes NOT DETECTED NOT DETECTED   Staphylococcus species NOT DETECTED NOT DETECTED   Staphylococcus aureus (BCID) NOT DETECTED NOT DETECTED   Streptococcus species NOT DETECTED NOT DETECTED   Streptococcus agalactiae NOT DETECTED NOT DETECTED   Streptococcus pneumoniae NOT DETECTED NOT DETECTED   Streptococcus pyogenes NOT DETECTED NOT DETECTED   Acinetobacter baumannii NOT DETECTED NOT DETECTED   Enterobacteriaceae species DETECTED (A) NOT DETECTED   Enterobacter cloacae complex NOT DETECTED NOT DETECTED   Escherichia coli NOT DETECTED NOT DETECTED   Klebsiella oxytoca NOT DETECTED NOT DETECTED   Klebsiella pneumoniae DETECTED (A) NOT DETECTED   Proteus species NOT DETECTED NOT DETECTED   Serratia marcescens NOT DETECTED NOT DETECTED   Carbapenem resistance NOT DETECTED NOT DETECTED   Haemophilus influenzae NOT DETECTED NOT DETECTED   Neisseria meningitidis NOT DETECTED NOT DETECTED   Pseudomonas aeruginosa NOT DETECTED NOT DETECTED   Candida albicans NOT DETECTED NOT DETECTED   Candida glabrata NOT DETECTED NOT DETECTED   Candida krusei NOT DETECTED NOT DETECTED   Candida parapsilosis NOT DETECTED NOT DETECTED   Candida tropicalis NOT DETECTED NOT DETECTED   Thomasene Ripple, PharmD, BCPS Clinical Pharmacist 03/23/2018

## 2018-03-23 NOTE — Evaluation (Signed)
Physical Therapy Evaluation Patient Details Name: Lorinda CreedHallie G Haymaker MRN: [id number] DOB: 1933-10-04 Today's Date: 03/23/2018   History of Present Illness  presented to ER secondary to abdominal pain, fever; admitted with sepsis related to UTI, acute cystitis.  Recently with pessary placed x5 days prior.  Clinical Impression  Upon evaluation, patient alert and oriented; follows commands, eager for OOB as tolerated.  Chronic weakness, ROM deficits to R shoulder (RTC injury?); otherwise, strength and ROM grossly [facility] for basic transfers and gait.  Able to complete bed mobility with min assist; sit/stand, basic transfers and gait (200') with RW, cga/min assist.  Decreased dynamic balance reactions, requiring UE support and use of RW for optimal safety.  Decreased cadence (10' walk time, 12-13 seconds), indicative of increased fall risk with mobility tasks. Of note, vitals stable and WFL on RA throughout session. Would benefit from skilled PT to address above deficits and promote optimal return to PLOF; Recommend transition to HHPT upon discharge from acute hospitalization.     Follow Up Recommendations Home health PT    Equipment Recommendations  Rolling walker with 5" wheels    Recommendations for Other Services       Precautions / Restrictions Precautions Precautions: Fall Restrictions Weight Bearing Restrictions: No      Mobility  Bed Mobility Overal bed mobility: Needs Assistance Bed Mobility: Supine to Sit     Supine to sit: Min assist        Transfers Overall transfer level: Needs assistance Equipment used: Rolling walker (2 wheeled) Transfers: Sit to/from Stand Sit to Stand: Min assist         General transfer comment: cuing for hand placement; multiple attempts to complete  Ambulation/Gait Ambulation/Gait assistance: Min assist Gait Distance (Feet): 200 Feet Assistive device: Rolling walker (2 wheeled)   Gait velocity: 10' walk time, 12-13 seconds    General Gait Details: reciprocal stepping pattern, slightly broad BOS with decreased heel strike/toe off; decreased cadence, but no overt buckling or LOB.  Requires UE support/assist device to maintain balance with dynamic gait components, requiring cessation of gait for completion of head turns, distractionary conversation.  Stairs            Wheelchair Mobility    Modified Rankin (Stroke Patients Only)       Balance Overall balance assessment: Needs assistance Sitting-balance support: No upper extremity supported;Feet supported Sitting balance-Leahy Scale: Good     Standing balance support: Bilateral upper extremity supported Standing balance-Leahy Scale: Fair                               Pertinent Vitals/Pain Pain Assessment: Faces Faces Pain Scale: Hurts little more Pain Location: R shoulder Pain Descriptors / Indicators: Aching;Grimacing;Guarding Pain Intervention(s): Limited activity within patient's tolerance;Monitored during session;Repositioned    Home Living Family/patient expects to be discharged to:: Private residence Living Arrangements: Alone Available Help at Discharge: Family;Available PRN/intermittently Type of Home: House Home Access: Stairs to enter Entrance Stairs-Rails: None Entrance Stairs-Number of Steps: 1 Home Layout: One level Home Equipment: Cane - single point      Prior Function Level of Independence: Independent with assistive device(s)         Comments: Mod indep without assist device for household distances, [facility] distances; endorses 4-5 falls within previous year.  Patient still with short-distance driving, but family assist with community errands and transportation as needed.     Hand Dominance   Dominant Hand: Right  Extremity/Trunk Assessment   Upper Extremity Assessment Upper Extremity Assessment: Generalized weakness(R shoulder 3-/5 (chronic RTC injury?), otherwise grossly 4-/5)     Lower Extremity Assessment Lower Extremity Assessment: Generalized weakness(grossly 4-/5 throughout)       Communication   Communication: No difficulties  Cognition Arousal/Alertness: Awake/alert Behavior During Therapy: WFL for tasks assessed/performed Overall Cognitive Status: Within Functional Limits for tasks assessed                                        General Comments      Exercises     Assessment/Plan    PT Assessment Patient needs continued PT services  PT Problem List Decreased strength;Decreased balance;Decreased activity tolerance;Decreased mobility;Decreased coordination;Decreased cognition;Decreased knowledge of use of DME;Decreased safety awareness;Cardiopulmonary status limiting activity       PT Treatment Interventions DME instruction;Gait training;Stair training;Functional mobility training;Therapeutic activities;Therapeutic exercise;Balance training;Patient/family education    PT Goals (Current goals can be found in the Care Plan section)  Acute Rehab PT Goals Patient Stated Goal: to return home PT Goal Formulation: With patient Time For Goal Achievement: 04/06/18 Potential to Achieve Goals: Good    Frequency Min 2X/week   Barriers to discharge        Co-evaluation               AM-PAC PT "6 Clicks" Mobility  Outcome Measure Help needed turning from your back to your side while in a flat bed without using bedrails?: A Little Help needed moving from lying on your back to sitting on the side of a flat bed without using bedrails?: A Little Help needed moving to and from a bed to a chair (including a wheelchair)?: A Little Help needed standing up from a chair using your arms (e.g., wheelchair or bedside chair)?: A Little Help needed to walk in hospital room?: A Little Help needed climbing 3-5 steps with a railing? : A Little 6 Click Score: 18    End of Session Equipment Utilized During Treatment: Gait belt Activity  Tolerance: Patient tolerated treatment well Patient left: in bed;with call bell/phone within reach;with bed alarm set Nurse Communication: Mobility status PT Visit Diagnosis: Muscle weakness (generalized) (M62.81);Difficulty in walking, not elsewhere classified (R26.2)    Time: [id number] PT Time Calculation (min) (ACUTE ONLY): 21 min   Charges:   PT Evaluation $PT Eval Moderate Complexity: 1 Mod          Lacy Sofia H. Manson PasseyBrown, PT, DPT, NCS 03/23/18, 9:57 AM [phone]

## 2018-03-23 NOTE — Care Management Note (Signed)
Case Management Note  Patient Details  Name: [name] MRN: [id number] Date of Birth: 10-10-1933  Subjective/Objective:                   Patient lives alone with son living beside of her and helps her Patient went to [employer] after last admission Patient needs a RW at home, notified [facility] of the need [name] Boatman will order [facility] list provided to the patient she is not sure what company she has used in the past.  Transportation is provided by son and daughter and the patient still drives The grand daughter called the daughter to find out about the Home health agency and who was used in the past Will call the daughter to speak about HH Left the [facility] list at the bedside  Action/Plan: [facility] list provided to the patient per CMS.gov  Expected Discharge Date:                  Expected Discharge Plan:     In-House Referral:     Discharge planning Services  CM Consult  Post Acute Care Choice:    Choice offered to:     DME Arranged:  Walker rolling DME Agency:  [facility]  HH Arranged:  PT [facility] Agency:     Status of Service:  In process, will continue to follow  If discussed at Long Length of Stay Meetings, dates discussed:    Additional Comments:  Barrie Dunker, RN 03/23/2018, 4:08 PM

## 2018-03-23 NOTE — Care Management (Signed)
Attempted to call the daughter Durward Mallard to discuss PT, no VM to leave a message, called son and left a VM requesting a call back

## 2018-03-23 NOTE — Progress Notes (Signed)
Positive blood culture results called to Dr. Sheryle Hail. No new orders at this time.  Will continue to monitor

## 2018-03-23 NOTE — Care Management (Signed)
Son called back and said that he would get up with his sister and find out what agency for [facility] the patient used in the past, they would let me know

## 2018-03-23 NOTE — ED Notes (Signed)
Report called to hiral rn floor nurse

## 2018-03-23 NOTE — ED Notes (Addendum)
Pt became short of breath.  Audible wheezing   Er md aware and dr Anne Hahn.  Breathing treatment given and meds given for pt.  Family at bedside.  Repeat xrays done.  Pt trembling.

## 2018-03-23 NOTE — Progress Notes (Signed)
Pharmacy Antibiotic Note  [name] is a 85 y.o. female admitted on 03/22/2018 with sepsis, Klebsiella pneumoniaea bacteremia KPC -.  Pharmacy has been consulted for meropenem dosing.  Plan: Pt is continuing to have fevers and WBC is significantly elevated. Will switch ceftriaxone to meropenem 1 g q12H per Dr. Cherlynn Kaiser. Will follow up with sensitivities. Tmax 103F.   Height: 5\' 4"  (162.6 cm) Weight: 164 lb 12.8 oz (74.8 kg) IBW/kg (Calculated) : 54.7  Temp (24hrs), Avg:100.2 F (37.9 C), Min:98.1 F (36.7 C), Max:103.2 F (39.6 C)  Recent Labs  Lab 03/22/18 1755 03/23/18 0515  WBC 14.1* 20.7*  CREATININE 0.76 0.80  LATICACIDVEN 1.6  --     Estimated Creatinine Clearance: 53.7 mL/min (by C-G formula based on SCr of 0.8 mg/dL).    No Known Allergies  Antimicrobials this admission: 2/10 ceftriaxone >> 2/11 2/11 meropenem >>   Dose adjustments this admission: Dose adjusted  Microbiology results: 2/10 BCx: GNR BCID + Kleb KPC -  2/10 UCx: pending    Thank you for allowing pharmacy to be a part of this patient's care.  Ronnald Ramp, PharmD, BCPS Clinical Pharmacist 03/23/2018 11:36 AM

## 2018-03-23 NOTE — ED Notes (Signed)
ED TO INPATIENT HANDOFF REPORT  Name/Age/Gender [name] CreedHallie G [name] 85 y.o. female  Code Status Code Status History    Date Active Date Inactive Code Status Order ID Comments User Context   01/13/2016 1710 01/25/2016 1853 Full Code [id number]  Henrene DodgePiscoya, Jose, MD ED      Home/SNF/Other Home  Chief Complaint alt mental status  Level of Care/Admitting Diagnosis ED Disposition    ED Disposition Condition Comment   Admit  [facility] [[id number][facility]  Level of Care: Med-Surg [16]  Diagnosis: Sepsis [facility](HCC) [[id number]][[id number]]  Admitting Physician: Enid BaasKALISETTI, RADHIKA [[id number]]  Attending Physician: Enid BaasKALISETTI, RADHIKA [[id number]]  Estimated length of stay: past midnight tomorrow  Certification:: I certify this patient will need inpatient services for at least 2 midnights  PT Class (Do Not Modify): Inpatient [101]  PT Acc Code (Do Not Modify): Private [1]       Medical History Past Medical History:  Diagnosis Date  . Carpal tunnel syndrome on both sides   . Diabetes (HCC)   . Diabetic polyneuropathy (HCC)   . Diverticulitis   . Hyperlipidemia   . Hypertension   . Osteoarthritis   . Vaginal prolapse     Allergies No Known Allergies  IV Location/Drains/Wounds Patient Lines/Drains/Airways Status   Active Line/Drains/Airways    Name:   Placement date:   Placement time:   Site:   Days:   Peripheral IV 03/22/18 Right Antecubital   03/22/18    1828    Antecubital   1   Peripheral IV 03/22/18 Left Forearm   03/22/18    1828    Forearm   1   Incision (Closed) 01/14/16 Abdomen   01/14/16    0157     799          Labs/Imaging Results for orders placed or performed during the hospital encounter of 03/22/18 (from the past 48 hour(s))  CBC with Differential     Status: Abnormal   Collection Time: 03/22/18  5:55 PM  Result Value Ref Range   WBC 14.1 (H) 4.0 - 10.5 K/uL   RBC 3.67 (L) 3.87 - 5.11 MIL/uL   Hemoglobin 11.1 (L) 12.0 - 15.0 g/dL   HCT 86.534.6 (L) 78.436.0 -  46.0 %   MCV 94.3 80.0 - 100.0 fL   MCH 30.2 26.0 - 34.0 pg   MCHC 32.1 30.0 - 36.0 g/dL   RDW 69.612.0 29.511.5 - 28.415.5 %   Platelets 274 150 - 400 K/uL   nRBC 0.0 0.0 - 0.2 %   Neutrophils Relative % 84 %   Neutro Abs 12.0 (H) 1.7 - 7.7 K/uL   Lymphocytes Relative 7 %   Lymphs Abs 1.0 0.7 - 4.0 K/uL   Monocytes Relative 8 %   Monocytes Absolute 1.1 (H) 0.1 - 1.0 K/uL   Eosinophils Relative 0 %   Eosinophils Absolute 0.0 0.0 - 0.5 K/uL   Basophils Relative 0 %   Basophils Absolute 0.0 0.0 - 0.1 K/uL   Immature Granulocytes 1 %   Abs Immature Granulocytes 0.07 0.00 - 0.07 K/uL    Comment: Performed at [facility], [address] [place], [place] [address]  Comprehensive metabolic panel     Status: Abnormal   Collection Time: 03/22/18  5:55 PM  Result Value Ref Range   Sodium 134 (L) 135 - 145 mmol/L   Potassium 3.0 (L) 3.5 - 5.1 mmol/L   Chloride 98 98 - 111 mmol/L   CO2 26 22 - 32 mmol/L  Glucose, Bld 260 (H) 70 - 99 mg/dL   BUN 12 8 - 23 mg/dL   Creatinine, Ser 6.12 0.44 - 1.00 mg/dL   Calcium 8.4 (L) 8.9 - 10.3 mg/dL   Total Protein 7.4 6.5 - 8.1 g/dL   Albumin 3.5 3.5 - 5.0 g/dL   AST 37 15 - 41 U/L   ALT 20 0 - 44 U/L   Alkaline Phosphatase 85 38 - 126 U/L   Total Bilirubin 0.9 0.3 - 1.2 mg/dL   GFR calc non Af Amer >60 >60 mL/min   GFR calc Af Amer >60 >60 mL/min   Anion gap 10 5 - 15    Comment: Performed at [facility], [address] [place], [place] [address]  Lipase, blood     Status: None   Collection Time: 03/22/18  5:55 PM  Result Value Ref Range   Lipase 25 11 - 51 U/L    Comment: Performed at [facility], [address] [place], [place] [address]  Influenza panel by PCR (type A & B)     Status: None   Collection Time: 03/22/18  5:55 PM  Result Value Ref Range   Influenza A By PCR NEGATIVE NEGATIVE   Influenza B By PCR NEGATIVE NEGATIVE    Comment: (NOTE) The Xpert Xpress Flu assay is intended as an aid in the diagnosis of   influenza and should not be used as a sole basis for treatment.  This  assay is FDA approved for nasopharyngeal swab specimens only. Nasal  washings and aspirates are unacceptable for Xpert Xpress Flu testing. Performed at [facility], [address] [place] [place], [place] [address]   Lactic acid, plasma     Status: None   Collection Time: 03/22/18  5:55 PM  Result Value Ref Range   Lactic Acid, Venous 1.6 0.5 - 1.9 mmol/L    Comment: Performed at [facility], [address] [place], [place] [address]  Urinalysis, Complete w Microscopic     Status: Abnormal   Collection Time: 03/22/18  6:45 PM  Result Value Ref Range   Color, Urine YELLOW (A) YELLOW   APPearance CLOUDY (A) CLEAR   Specific Gravity, Urine 1.013 1.005 - 1.030   pH 5.0 5.0 - 8.0   Glucose, UA >=500 (A) NEGATIVE mg/dL   Hgb urine dipstick LARGE (A) NEGATIVE   Bilirubin Urine NEGATIVE NEGATIVE   Ketones, ur NEGATIVE NEGATIVE mg/dL   Protein, ur 670 (A) NEGATIVE mg/dL   Nitrite NEGATIVE NEGATIVE   Leukocytes, UA MODERATE (A) NEGATIVE   RBC / HPF >50 (H) 0 - 5 RBC/hpf   WBC, UA >50 (H) 0 - 5 WBC/hpf   Bacteria, UA MANY (A) NONE SEEN   Squamous Epithelial / LPF 0-5 0 - 5   WBC Clumps PRESENT    Mucus PRESENT     Comment: Performed at [facility], [address] [place], [place] [address]   Dg Chest 2 View  Result Date: 03/22/2018 CLINICAL DATA:  Fever workup EXAM: CHEST - 2 VIEW COMPARISON:  01/21/2016 FINDINGS: No pleural effusion. Streaky atelectasis at the bases. Mild cardiomegaly. No pneumothorax. Advanced degenerative changes of both shoulders. IMPRESSION: No active cardiopulmonary disease.  Mild cardiomegaly Electronically Signed   By: Jasmine Pang M.D.   On: 03/22/2018 19:13    Pending Labs Unresulted Labs (From admission, onward)    Start     Ordered   03/22/18 1946  Urine Culture  Add-on,   AD  Question:  Patient immune status  Answer:  Normal   03/22/18 1945   03/22/18  1845  Blood Culture (routine x 2)  BLOOD CULTURE X 2,   STAT     03/22/18 1844   Signed and Held  Basic metabolic panel  Tomorrow morning,   R     Signed and Held   Signed and Held  CBC  Tomorrow morning,   R     Signed and Held          Vitals/Pain Today's Vitals   03/23/18 0130 03/23/18 0200 03/23/18 0213 03/23/18 0215  BP: (!) 202/186 (!) 229/115 (!) 154/68   Pulse: (!) 108  100   Resp: (!) 23 (!) 23 (!) 21   Temp:    (!) 103.2 F (39.6 C)  TempSrc:    Oral  SpO2: 99%  92%   Weight:      Height:      PainSc:        Isolation Precautions Droplet precaution  Medications Medications  potassium chloride SA (K-DUR,KLOR-CON) CR tablet 40 mEq (40 mEq Oral Given 03/23/18 0200)  labetalol (NORMODYNE,TRANDATE) injection 10 mg (10 mg Intravenous Given 03/23/18 0201)  nitroGLYCERIN (NITROGLYN) 2 % ointment 0.5 inch (has no administration in time range)  acetaminophen (TYLENOL) tablet 650 mg (has no administration in time range)  acetaminophen (TYLENOL) tablet 1,000 mg (1,000 mg Oral Given 03/22/18 1905)  cefTRIAXone (ROCEPHIN) 2 g in sodium chloride 0.9 % 100 mL IVPB (0 g Intravenous Stopped 03/22/18 2230)  sodium chloride 0.9 % bolus 500 mL (0 mLs Intravenous Stopped 03/22/18 2230)  ipratropium-albuterol (DUONEB) 0.5-2.5 (3) MG/3ML nebulizer solution (  Given 03/23/18 0205)  furosemide (LASIX) injection 20 mg (20 mg Intravenous Given 03/23/18 0200)  nitroGLYCERIN (NITROGLYN) 2 % ointment 1 inch (1 inch Topical Given 03/23/18 0201)    Mobility walks

## 2018-03-24 LAB — CBC
HCT: 28.9 % — ABNORMAL LOW (ref 36.0–46.0)
Hemoglobin: 9.3 g/dL — ABNORMAL LOW (ref 12.0–15.0)
MCH: 30.6 pg (ref 26.0–34.0)
MCHC: 32.2 g/dL (ref 30.0–36.0)
MCV: 95.1 fL (ref 80.0–100.0)
Platelets: 238 10*3/uL (ref 150–400)
RBC: 3.04 MIL/uL — ABNORMAL LOW (ref 3.87–5.11)
RDW: 12.3 % (ref 11.5–15.5)
WBC: 18.5 10*3/uL — ABNORMAL HIGH (ref 4.0–10.5)
nRBC: 0 % (ref 0.0–0.2)

## 2018-03-24 LAB — GLUCOSE, CAPILLARY
GLUCOSE-CAPILLARY: 84 mg/dL (ref 70–99)
Glucose-Capillary: 73 mg/dL (ref 70–99)
Glucose-Capillary: 83 mg/dL (ref 70–99)
Glucose-Capillary: 92 mg/dL (ref 70–99)

## 2018-03-24 LAB — BASIC METABOLIC PANEL
Anion gap: 6 (ref 5–15)
BUN: 12 mg/dL (ref 8–23)
CALCIUM: 7.7 mg/dL — AB (ref 8.9–10.3)
CO2: 24 mmol/L (ref 22–32)
CREATININE: 0.56 mg/dL (ref 0.44–1.00)
Chloride: 109 mmol/L (ref 98–111)
GFR calc Af Amer: >60 mL/min (ref >60)
GFR calc non Af Amer: >60 mL/min (ref >60)
Glucose, Bld: 88 mg/dL (ref 70–99)
Potassium: 3.1 mmol/L — ABNORMAL LOW (ref 3.5–5.1)
Sodium: 139 mmol/L (ref 135–145)

## 2018-03-24 MED ORDER — GLIMEPIRIDE 1 MG PO TABS
1.0000 mg | ORAL_TABLET | Freq: Every day | ORAL | Status: DC
  Administered 2018-03-25: 1 mg via ORAL
  Filled 2018-03-24: qty 1

## 2018-03-24 MED ORDER — SODIUM CHLORIDE 0.9 % IV SOLN
1.0000 g | Freq: Two times a day (BID) | INTRAVENOUS | Status: DC
  Administered 2018-03-24 – 2018-03-25 (×2): 1 g via INTRAVENOUS
  Filled 2018-03-24 (×5): qty 1

## 2018-03-24 MED ORDER — POTASSIUM CHLORIDE CRYS ER 20 MEQ PO TBCR
60.0000 meq | EXTENDED_RELEASE_TABLET | Freq: Once | ORAL | Status: AC
  Administered 2018-03-24: 60 meq via ORAL
  Filled 2018-03-24: qty 3

## 2018-03-24 MED ORDER — LISINOPRIL 10 MG PO TABS
10.0000 mg | ORAL_TABLET | Freq: Every day | ORAL | Status: DC
  Administered 2018-03-24 – 2018-03-25 (×2): 10 mg via ORAL
  Filled 2018-03-24 (×2): qty 1

## 2018-03-24 NOTE — Care Management Note (Signed)
Case Management Note  Patient Details  Name: [name] MRN: [id number] Date of Birth: 1933-09-18  Subjective/Objective:                   Met with the patient to discuss DC plan and needs RW at bedside from [place] list reviewed per CMS>gov and patient chooses [name],  She says her insurance comes out and evaluates her once a year and will come in March Notified Clair Gulling Powers with [name] of the patient choice Patient is independent at home and walks around, she still drives and has back up with her children  Patient has a BSC at home Patient uses [facility] as a pharmacy PCP is Dr. Lorenda Cahill  Patient states she has no other needs at this time  Action/Plan: [facility] list provided per CMS.gov [facility], notified Reed Pandy at [place]  Expected Discharge Date:                  Expected Discharge Plan:     In-House Referral:     Discharge planning Services  CM Consult  Post Acute Care Choice:    Choice offered to:     DME Arranged:  Walker rolling DME Agency:  [place]:  PT [place]:  [place]  Status of Service:  In process, will continue to follow  If discussed at Long Length of Stay Meetings, dates discussed:    Additional Comments:  Su Hilt, RN 03/24/2018, 11:14 AM

## 2018-03-24 NOTE — Progress Notes (Signed)
[facility] [place] NAME: [name]    MR#:  [id number]  DATE OF BIRTH:  29-Jul-1933  SUBJECTIVE:   Patient sitting up in the chair eating breakfast.  Feels much better today.  White cell count is trended down.  Afebrile today.  No nausea or vomiting.  REVIEW OF SYSTEMS:    Review of Systems  Constitutional: Negative for chills and fever.  HENT: Negative for congestion and tinnitus.   Eyes: Negative for blurred vision and double vision.  Respiratory: Negative for cough, shortness of breath and wheezing.   Cardiovascular: Negative for chest pain, orthopnea and PND.  Gastrointestinal: Negative for abdominal pain, diarrhea, nausea and vomiting.  Genitourinary: Negative for dysuria and hematuria.  Neurological: Negative for dizziness, sensory change and focal weakness.  All other systems reviewed and are negative.   Nutrition: Heart healthy/Carb control Tolerating Diet: yes Tolerating PT: Await Eval.    DRUG ALLERGIES:  No Known Allergies  VITALS:  Blood pressure 139/76, pulse 85, temperature 98.2 F (36.8 C), temperature source Oral, resp. rate 17, height _0  (1.626 m), weight 74.8 kg, SpO2 97 %.  PHYSICAL EXAMINATION:   Physical Exam  GENERAL:  85 y.o.-year-old patient lying in bed in no acute distress.  EYES: Pupils equal, round, reactive to light and accommodation. No scleral icterus. Extraocular muscles intact.  HEENT: Head atraumatic, normocephalic. Oropharynx and nasopharynx clear.  NECK:  Supple, no jugular venous distention. No thyroid enlargement, no tenderness.  LUNGS: Normal breath sounds bilaterally, no wheezing, rales, rhonchi. No use of accessory muscles of respiration.  CARDIOVASCULAR: S1, S2 normal. No murmurs, rubs, or gallops.  ABDOMEN: Soft, nontender, nondistended. Bowel sounds present. No organomegaly or mass.  EXTREMITIES: No cyanosis, clubbing or edema b/l.    NEUROLOGIC: Cranial nerves II  through XII are intact. No focal Motor or sensory deficits b/l. Globally weak.   PSYCHIATRIC: The patient is alert and oriented x 3.  SKIN: No obvious rash, lesion, or ulcer.    LABORATORY PANEL:   CBC Recent Labs  Lab 03/24/18 0413  WBC 18.5*  HGB 9.3*  HCT 28.9*  PLT 238   ------------------------------------------------------------------------------------------------------------------  Chemistries  Recent Labs  Lab 03/22/18 1755  03/24/18 0413  NA 134*   < > 139  K 3.0*   < > 3.1*  CL 98   < > 109  CO2 26   < > 24  GLUCOSE 260*   < > 88  BUN 12   < > 12  CREATININE 0.76   < > 0.56  CALCIUM 8.4*   < > 7.7*  AST 37  --   --   ALT 20  --   --   ALKPHOS 85  --   --   BILITOT 0.9  --   --    < > = values in this interval not displayed.   ------------------------------------------------------------------------------------------------------------------  Cardiac Enzymes No results for input(s): TROPONINI in the last 168 hours. ------------------------------------------------------------------------------------------------------------------  RADIOLOGY:  Dg Neck Soft Tissue  Result Date: 03/23/2018 CLINICAL DATA:  Shortness of Breath EXAM: NECK SOFT TISSUES - 1+ VIEW COMPARISON:  None FINDINGS: Epiglottis and aryepiglottic folds are normal. Airway is patent. Retropharyngeal soft tissues are normal. Advanced degenerative changes in the cervical spine. IMPRESSION: No acute findings. Electronically Signed   By: Rolm Baptise M.D.   On: 03/23/2018 02:23   Dg Chest 2 View  Result Date: 03/22/2018 CLINICAL DATA:  Fever workup EXAM: CHEST -  2 VIEW COMPARISON:  01/21/2016 FINDINGS: No pleural effusion. Streaky atelectasis at the bases. Mild cardiomegaly. No pneumothorax. Advanced degenerative changes of both shoulders. IMPRESSION: No active cardiopulmonary disease.  Mild cardiomegaly Electronically Signed   By: Donavan Foil M.D.   On: 03/22/2018 19:13   US Renal  Result Date:  03/23/2018 CLINICAL DATA:  85 year old female with bacteremia. Initial encounter. EXAM: RENAL / URINARY TRACT ULTRASOUND COMPLETE COMPARISON:  01/17/2016 CT. FINDINGS: Right Kidney: Renal measurements: 11.6 x 5 x 4.2 cm = volume: 127.5 mL . Echogenicity within normal limits. Tiny cysts. Slightly lobular contour. No worrisome mass or hydronephrosis visualized. Left Kidney: Renal measurements: 11.8 x 5.1 x 4.2 cm = volume: 129.9 mL. Echogenicity within normal limits. Tiny cysts. Slightly lobulated contour. No worrisome mass or hydronephrosis visualized. Bladder: Appears normal for degree of bladder distention. IMPRESSION: No hydronephrosis. Electronically Signed   By: Genia Del M.D.   On: 03/23/2018 14:44   Dg Chest Portable 1 View  Result Date: 03/23/2018 CLINICAL DATA:  Shortness of Breath EXAM: PORTABLE CHEST 1 VIEW COMPARISON:  03/22/2018 FINDINGS: Heart is borderline in size. No confluent airspace opacities, effusions or edema. No acute bony abnormality. Advanced degenerative changes in the shoulders bilaterally. IMPRESSION: No active disease. Electronically Signed   By: Rolm Baptise M.D.   On: 03/23/2018 02:23     ASSESSMENT AND PLAN:   85 year old female with past medical history of Hypertension, hyperlipidemia, osteoarthritis, diabetes, diabetic neuropathy who presented to the hospital due to fever, chills, nausea and noted to have sepsis secondary to UTI.  1.  Sepsis-patient met criteria given her fever, leukocytosis, abnormal urinalysis. - Patient's blood cultures are positive for Klebsiella patient was on meropenem but will switch back to ceftriaxone as per discussion with pharmacy.  Follow sensitivities on the blood cultures.  Patient is afebrile today.  2.  Urinary tract infection- source of patient's sepsis -Continue IV ceftriaxone as mentioned above.  Urine cultures are positive for 100,000 colonies of gram-negative rod but identification and sensitivities pending.  Renal ultrasound  was negative for obstruction.  3.  Leukocytosis-secondary to sepsis.   -Improving with IV antibiotics.Marland Kitchen  4.  Diabetes type 2 without complication-continue sliding scale insulin, Amaryl, Metformin - cont. Tradjenta.  Blood sugar stable  5. Hypokalemia -we will continue to replace, repeat level in the morning.  Mag level is normal.  6. Hyperlipidemia - cont. Simvastatin  Possible d/c home tomorrow. Will get PT eval.   All the records are reviewed and case discussed with Care Management/Social Worker. Management plans discussed with the patient, family and they are in agreement.  CODE STATUS: Full code  DVT Prophylaxis: Lovenox  TOTAL TIME TAKING CARE OF THIS PATIENT: 30 minutes.   POSSIBLE D/C IN 1-2 DAYS, DEPENDING ON CLINICAL CONDITION.   Henreitta Leber M.D on 03/24/2018 at 1:13 PM  Between 7am to 6pm - Pager - [phone]  After 6pm go to [email or web address] - Proofreader  [facility]  Office  [phone]  CC: Primary care physician; Glendon Axe, MD

## 2018-03-24 NOTE — Progress Notes (Signed)
Pharmacy Antibiotic Note  [name] is a 85 y.o. female admitted on 03/22/2018 with sepsis, Klebsiella pneumoniaea bacteremia KPC secondary to UTI Was treated for UTI in January 2019 with Keflex. No apparent history of ESBL.  Pharmacy has been consulted for Cefepime dosing.  Plan: Ceftriaxone was switched to Meropenem after ~18 hours on CFTX therapy. Discussed with attending and ID. Switching to Cefepime 1 g IV q12h (potential de-escalate to ceftriaxone after 48 hrs).   Height: 5\' 4"  (162.6 cm) Weight: 164 lb 12.8 oz (74.8 kg) IBW/kg (Calculated) : 54.7  Temp (24hrs), Avg:99.6 F (37.6 C), Min:98.2 F (36.8 C), Max:101.7 F (38.7 C)  Recent Labs  Lab 03/22/18 1755 03/23/18 0515 03/24/18 0413  WBC 14.1* 20.7* 18.5*  CREATININE 0.76 0.80 0.56  LATICACIDVEN 1.6  --   --     Estimated Creatinine Clearance: 53.7 mL/min (by C-G formula based on SCr of 0.56 mg/dL).    No Known Allergies  Antimicrobials this admission: 2/10 ceftriaxone >> 2/11 2/11 meropenem >> 2/12 2/12 Cefepime >>  Dose adjustments this admission: Dose adjusted  Microbiology results: 2/10 BCx: GNR BCID + Kleb KPC -  2/10 UCx: GNRs   Thank you for allowing pharmacy to be a part of this patient's care.   Mauri Reading, PharmD Pharmacy Resident  03/24/2018 1:23 PM

## 2018-03-25 ENCOUNTER — Inpatient Hospital Stay: Payer: Medicare Other

## 2018-03-25 LAB — GLUCOSE, CAPILLARY
Glucose-Capillary: 128 mg/dL — ABNORMAL HIGH (ref 70–99)
Glucose-Capillary: 107 mg/dL — ABNORMAL HIGH (ref 70–99)

## 2018-03-25 LAB — CULTURE, BLOOD (ROUTINE X 2)

## 2018-03-25 LAB — CBC
HCT: 28.2 % — ABNORMAL LOW (ref 36.0–46.0)
Hemoglobin: 8.9 g/dL — ABNORMAL LOW (ref 12.0–15.0)
MCH: 29.6 pg (ref 26.0–34.0)
MCHC: 31.6 g/dL (ref 30.0–36.0)
MCV: 93.7 fL (ref 80.0–100.0)
Platelets: 252 10*3/uL (ref 150–400)
RBC: 3.01 MIL/uL — ABNORMAL LOW (ref 3.87–5.11)
RDW: 12.7 % (ref 11.5–15.5)
WBC: 14.7 10*3/uL — ABNORMAL HIGH (ref 4.0–10.5)
nRBC: 0 % (ref 0.0–0.2)

## 2018-03-25 LAB — URINE CULTURE
Culture: >=100000 — AB
SPECIAL REQUESTS: NORMAL

## 2018-03-25 MED ORDER — IPRATROPIUM-ALBUTEROL 0.5-2.5 (3) MG/3ML IN SOLN
3.0000 mL | Freq: Four times a day (QID) | RESPIRATORY_TRACT | Status: DC | PRN
  Administered 2018-03-25: 3 mL via RESPIRATORY_TRACT

## 2018-03-25 MED ORDER — CEFUROXIME AXETIL 250 MG PO TABS: 250.0000 mg | ORAL_TABLET | Freq: Two times a day (BID) | ORAL | 0 refills | Status: AC

## 2018-03-25 NOTE — Discharge Summary (Signed)
[facility] [place] NAME: [name]    MR#:  [id number]  DATE OF BIRTH:  [birth year]  DATE OF ADMISSION:  03/22/2018 ADMITTING PHYSICIAN: Gladstone Lighter, MD  DATE OF DISCHARGE: 03/25/2018  PRIMARY CARE PHYSICIAN: Glendon Axe, MD    ADMISSION DIAGNOSIS:  Syncope and collapse [R55] Sepsis, due to unspecified organism, unspecified whether acute organ dysfunction present ([place]) [A41.9]  DISCHARGE DIAGNOSIS:  Active Problems:   Sepsis ([place])   SECONDARY DIAGNOSIS:   Past Medical History:  Diagnosis Date  . Carpal tunnel syndrome on both sides   . Diabetes ([place])   . Diabetic polyneuropathy ([place])   . Diverticulitis   . Hyperlipidemia   . Hypertension   . Osteoarthritis   . Vaginal prolapse     HOSPITAL COURSE:   85 year old female with past medical history of Hypertension, hyperlipidemia, osteoarthritis, diabetes, diabetic neuropathy who presented to the hospital due to fever, chills, nausea and noted to have sepsis secondary to UTI.  1.  Sepsis-patient met criteria given her fever, leukocytosis, abnormal urinalysis. -This was secondary to UTI.  Patient was initially treated with IV ceftriaxone then switched to IV meropenem and then switch back to IV ceftriaxone.  Patient's blood and urine cultures are positive for Klebsiella and it sensitive to ceftriaxone. -She has clinically improved as her white cell count is come down, she is afebrile and hemodynamically stable.  Will discharge her on oral Ceftin for additional 11 days.  2.  Urinary tract infection- source of patient's sepsis -Initially treated with IV ceftriaxone then switched to IV meropenem and then back to IV cefepime and now being discharged on oral Ceftin.  Her urine cultures are positive for Klebsiella and it is sensitive to ceftriaxone.  3.  Leukocytosis-secondary to sepsis.   - improved w/ IV abx.   4.  Diabetes type 2 without complication- while in the  hospital pt. Was on SSI but now will resume her Amaryl, Metformin, januvia.   5. Hypokalemia - improved with supplementation.   6. Hyperlipidemia - pt. Will cont. Simvastatin   DISCHARGE CONDITIONS:   Stable.   CONSULTS OBTAINED:    DRUG ALLERGIES:  No Known Allergies  DISCHARGE MEDICATIONS:   Allergies as of 03/25/2018   No Known Allergies     Medication List    STOP taking these medications   nystatin cream Commonly known as:  MYCOSTATIN     TAKE these medications   acetaminophen 325 MG tablet Commonly known as:  TYLENOL Take 2 tablets (650 mg total) by mouth every 6 (six) hours as needed for mild pain, moderate pain, fever or headache.   aspirin EC 81 MG tablet Take 81 mg by mouth daily.   cefUROXime 250 MG tablet Commonly known as:  CEFTIN Take 1 tablet (250 mg total) by mouth 2 (two) times daily with a meal for 11 days.   glimepiride 2 MG tablet Commonly known as:  AMARYL Take 1 mg by mouth daily.   lisinopril-hydrochlorothiazide 10-12.5 MG tablet Commonly known as:  PRINZIDE,ZESTORETIC Take 1 tablet by mouth daily.   metFORMIN 500 MG tablet Commonly known as:  GLUCOPHAGE Take 500 mg by mouth 2 (two) times daily with a meal.   naproxen sodium 220 MG tablet Commonly known as:  ALEVE Take 220-440 mg by mouth 2 (two) times daily as needed (pain).   simvastatin 40 MG tablet Commonly known as:  ZOCOR Take 40 mg by mouth at bedtime.   sitaGLIPtin 100 MG tablet  Commonly known as:  JANUVIA Take 100 mg by mouth daily.         DISCHARGE INSTRUCTIONS:   DIET:  Cardiac diet and Diabetic diet  DISCHARGE CONDITION:  Stable  ACTIVITY:  Activity as tolerated  OXYGEN:  Home Oxygen: No.   Oxygen Delivery: room air  DISCHARGE LOCATION:  Home with Home Health PT.   If you experience worsening of your admission symptoms, develop shortness of breath, life threatening emergency, suicidal or homicidal thoughts you must seek medical attention  immediately by calling 911 or calling your MD immediately  if symptoms less severe.  You Must read complete instructions/literature along with all the possible adverse reactions/side effects for all the Medicines you take and that have been prescribed to you. Take any new Medicines after you have completely understood and accpet all the possible adverse reactions/side effects.   Please note  You were cared for by a hospitalist during your hospital stay. If you have any questions about your discharge medications or the care you received while you were in the hospital after you are discharged, you can call the unit and asked to speak with the hospitalist on call if the hospitalist that took care of you is not available. Once you are discharged, your primary care physician will handle any further medical issues. Please note that NO REFILLS for any discharge medications will be authorized once you are discharged, as it is imperative that you return to your primary care physician (or establish a relationship with a primary care physician if you do not have one) for your aftercare needs so that they can reassess your need for medications and monitor your lab values.     Today   Pt. Complaining of some wheezing, although CXR is (-). Will give some albuterol nebs and if improving will d/c home today.   VITAL SIGNS:  Blood pressure (!) 150/68, pulse 91, temperature 98.4 F (36.9 C), temperature source Oral, resp. rate 19, height 5' 4"  (1.626 m), weight 74.8 kg, SpO2 100 %.  I/O:    Intake/Output Summary (Last 24 hours) at 03/25/2018 1317 Last data filed at 03/25/2018 1025 Gross per 24 hour  Intake 580 ml  Output 2 ml  Net 578 ml    PHYSICAL EXAMINATION:   GENERAL:  85 y.o.-year-old patient lying in bed in no acute distress.  EYES: Pupils equal, round, reactive to light and accommodation. No scleral icterus. Extraocular muscles intact.  HEENT: Head atraumatic, normocephalic. Oropharynx and  nasopharynx clear.  NECK:  Supple, no jugular venous distention. No thyroid enlargement, no tenderness.  LUNGS: Normal breath sounds bilaterally, minimal end-exp wheezing, No rales, rhonchi. No use of accessory muscles of respiration.  CARDIOVASCULAR: S1, S2 normal. No murmurs, rubs, or gallops.  ABDOMEN: Soft, nontender, nondistended. Bowel sounds present. No organomegaly or mass.  EXTREMITIES: No cyanosis, clubbing or edema b/l.    NEUROLOGIC: Cranial nerves II through XII are intact. No focal Motor or sensory deficits b/l. Globally weak.   PSYCHIATRIC: The patient is alert and oriented x 3.  SKIN: No obvious rash, lesion, or ulcer.    DATA REVIEW:   CBC Recent Labs  Lab 03/25/18 0538  WBC 14.7*  HGB 8.9*  HCT 28.2*  PLT 252    Chemistries  Recent Labs  Lab 03/22/18 1755  03/24/18 0413  NA 134*   < > 139  K 3.0*   < > 3.1*  CL 98   < > 109  CO2 26   < >  24  GLUCOSE 260*   < > 88  BUN 12   < > 12  CREATININE 0.76   < > 0.56  CALCIUM 8.4*   < > 7.7*  AST 37  --   --   ALT 20  --   --   ALKPHOS 85  --   --   BILITOT 0.9  --   --    < > = values in this interval not displayed.    Cardiac Enzymes No results for input(s): TROPONINI in the last 168 hours.  Microbiology Results  Results for orders placed or performed during the hospital encounter of 03/22/18  Blood Culture (routine x 2)     Status: Abnormal   Collection Time: 03/22/18  5:55 PM  Result Value Ref Range Status   Specimen Description   Final    BLOOD RIGHT ANTECUBITAL Performed at [facility], [address], [place], [place] [address]    Special Requests   Final    BOTTLES DRAWN AEROBIC AND ANAEROBIC Blood Culture results may not be optimal due to an excessive volume of blood received in culture bottles Performed at [facility], [address], [place], [place] [address]    Culture  Setup Time   Final    GRAM NEGATIVE RODS IN BOTH AEROBIC AND ANAEROBIC BOTTLES CRITICAL VALUE  NOTED.  VALUE IS CONSISTENT WITH PREVIOUSLY REPORTED AND CALLED VALUE. Performed at [facility], [address] [place], [place] [address]    Culture (A)  Final    KLEBSIELLA PNEUMONIAE SUSCEPTIBILITIES PERFORMED ON PREVIOUS CULTURE WITHIN THE LAST 5 DAYS. Performed at [facility], [place] [address], [place], [place] [address]    Report Status 03/25/2018 FINAL  Final  Blood Culture (routine x 2)     Status: Abnormal   Collection Time: 03/22/18  5:55 PM  Result Value Ref Range Status   Specimen Description   Final    BLOOD BLOOD LEFT FOREARM Performed at [facility], [address] [place], [place] [address]    Special Requests   Final    BOTTLES DRAWN AEROBIC AND ANAEROBIC Blood Culture results may not be optimal due to an excessive volume of blood received in culture bottles Performed at [facility], [address] [place],  [address]    Culture  Setup Time   Final    GRAM NEGATIVE RODS IN BOTH AEROBIC AND ANAEROBIC BOTTLES CRITICAL RESULT CALLED TO, READ BACK BY AND VERIFIED WITH: [place] AT 0867 03/23/2018 SDR Performed at [facility], [place] [address] [place], [place] [address]    Culture KLEBSIELLA PNEUMONIAE (A)  Final   Report Status 03/25/2018 FINAL  Final   Organism ID, Bacteria KLEBSIELLA PNEUMONIAE  Final      Susceptibility   Klebsiella pneumoniae - MIC*    AMPICILLIN RESISTANT Resistant     CEFAZOLIN <=4 SENSITIVE Sensitive     CEFEPIME <=1 SENSITIVE Sensitive     CEFTAZIDIME <=1 SENSITIVE Sensitive     CEFTRIAXONE <=1 SENSITIVE Sensitive     CIPROFLOXACIN 1 SENSITIVE Sensitive     GENTAMICIN <=1 SENSITIVE Sensitive     IMIPENEM <=0.25 SENSITIVE Sensitive     TRIMETH/SULFA >=320 RESISTANT Resistant     AMPICILLIN/SULBACTAM 4 SENSITIVE Sensitive     PIP/TAZO <=4 SENSITIVE Sensitive     Extended ESBL NEGATIVE Sensitive     * KLEBSIELLA PNEUMONIAE  Blood Culture ID Panel (Reflexed)     Status: Abnormal  Collection Time: 03/22/18  5:55 PM  Result Value Ref Range Status   Enterococcus species NOT DETECTED NOT DETECTED Final   Listeria monocytogenes NOT DETECTED NOT DETECTED Final   Staphylococcus species NOT DETECTED NOT DETECTED Final   Staphylococcus aureus (BCID) NOT DETECTED NOT DETECTED Final   Streptococcus species NOT DETECTED NOT DETECTED Final   Streptococcus agalactiae NOT DETECTED NOT DETECTED Final   Streptococcus pneumoniae NOT DETECTED NOT DETECTED Final   Streptococcus pyogenes NOT DETECTED NOT DETECTED Final   Acinetobacter baumannii NOT DETECTED NOT DETECTED Final   Enterobacteriaceae species DETECTED (A) NOT DETECTED Final    Comment: Enterobacteriaceae represent a large family of gram-negative bacteria, not a single organism. CRITICAL RESULT CALLED TO, READ BACK BY AND VERIFIED WITH:  DAVID BESANTI AT 0607 03/23/2018 SDR    Enterobacter cloacae complex NOT DETECTED NOT DETECTED Final   Escherichia coli NOT DETECTED NOT DETECTED Final   Klebsiella oxytoca NOT DETECTED NOT DETECTED Final   Klebsiella pneumoniae DETECTED (A) NOT DETECTED Final    Comment: CRITICAL RESULT CALLED TO, READ BACK BY AND VERIFIED WITH:  DAVID BESANTI AT 0607 03/23/2018 SDR    Proteus species NOT DETECTED NOT DETECTED Final   Serratia marcescens NOT DETECTED NOT DETECTED Final   Carbapenem resistance NOT DETECTED NOT DETECTED Final   Haemophilus influenzae NOT DETECTED NOT DETECTED Final   Neisseria meningitidis NOT DETECTED NOT DETECTED Final   Pseudomonas aeruginosa NOT DETECTED NOT DETECTED Final   Candida albicans NOT DETECTED NOT DETECTED Final   Candida glabrata NOT DETECTED NOT DETECTED Final   Candida krusei NOT DETECTED NOT DETECTED Final   Candida parapsilosis NOT DETECTED NOT DETECTED Final   Candida tropicalis NOT DETECTED NOT DETECTED Final    Comment: Performed at [facility], [address], [place], [place] [address]  Urine Culture     Status: Abnormal   Collection  Time: 03/22/18  7:46 PM  Result Value Ref Range Status   Specimen Description   Final    URINE, CLEAN CATCH Performed at [facility], [address], [place], [place] [address]    Special Requests   Final    Normal Performed at [facility], [address] [place], [place] [address]    Culture >=100,000 COLONIES/mL KLEBSIELLA PNEUMONIAE (A)  Final   Report Status 03/25/2018 FINAL  Final   Organism ID, Bacteria KLEBSIELLA PNEUMONIAE (A)  Final      Susceptibility   Klebsiella pneumoniae - MIC*    AMPICILLIN RESISTANT Resistant     CEFAZOLIN <=4 SENSITIVE Sensitive     CEFTRIAXONE <=1 SENSITIVE Sensitive     CIPROFLOXACIN <=0.25 SENSITIVE Sensitive     GENTAMICIN <=1 SENSITIVE Sensitive     IMIPENEM <=0.25 SENSITIVE Sensitive     NITROFURANTOIN 64 INTERMEDIATE Intermediate     TRIMETH/SULFA >=320 RESISTANT Resistant     AMPICILLIN/SULBACTAM 4 SENSITIVE Sensitive     PIP/TAZO <=4 SENSITIVE Sensitive     Extended ESBL NEGATIVE Sensitive     * >=100,000 COLONIES/mL KLEBSIELLA PNEUMONIAE    RADIOLOGY:  US Renal  Result Date: 03/23/2018 CLINICAL DATA:  85 year old female with bacteremia. Initial encounter. EXAM: RENAL / URINARY TRACT ULTRASOUND COMPLETE COMPARISON:  01/17/2016 CT. FINDINGS: Right Kidney: Renal measurements: 11.6 x 5 x 4.2 cm = volume: 127.5 mL . Echogenicity within normal limits. Tiny cysts. Slightly lobular contour. No worrisome mass or hydronephrosis visualized. Left Kidney: Renal measurements: 11.8 x 5.1 x 4.2 cm = volume: 129.9 mL. Echogenicity within normal limits.  Tiny cysts. Slightly lobulated contour. No worrisome mass or hydronephrosis visualized. Bladder: Appears normal for degree of bladder distention. IMPRESSION: No hydronephrosis. Electronically Signed   By: Genia Del M.D.   On: 03/23/2018 14:44   Dg Chest Port 1 View  Result Date: 03/25/2018 CLINICAL DATA:  Wheezing EXAM: PORTABLE CHEST 1 VIEW COMPARISON:  Chest radiograph  03/23/2018 FINDINGS: Normal scratch the stable cardiac and mediastinal contours. No consolidative pulmonary opacities. No pleural effusion or pneumothorax. Thoracic spine degenerative changes. Bilateral shoulder joint degenerative changes. IMPRESSION: No acute cardiopulmonary process. Electronically Signed   By: Lovey Newcomer M.D.   On: 03/25/2018 10:53      Management plans discussed with the patient, family and they are in agreement.  CODE STATUS:     Code Status Orders  (From admission, onward)         Start     Ordered   03/23/18 0228  Full code  Continuous     03/23/18 0227       TOTAL TIME TAKING CARE OF THIS PATIENT: 45 minutes.    Henreitta Leber M.D on 03/25/2018 at 1:17 PM  Between 7am to 6pm - Pager - [phone]  After 6pm go to [email or web address] - Proofreader  [facility]  Office  [phone]  CC: Primary care physician; Glendon Axe, MD

## 2018-03-25 NOTE — Progress Notes (Signed)
Physical Therapy Treatment Patient Details Name: TASHONA JALIL MRN: [id number] DOB: [birth year] Today's Date: 03/25/2018    History of Present Illness presented to ER secondary to abdominal pain, fever; admitted with sepsis related to UTI, acute cystitis.  Recently with pessary placed x5 days prior.    PT Comments    Pt awaiting discharge.  Agrees to gait around unit x 1 with no LOB but some verbal cues for safety and to keep walker closer.  Voices no concerns regarding mobility and feels ready for discharge.  Follow Up Recommendations  Home health PT     Equipment Recommendations  Rolling walker with 5" wheels    Recommendations for Other Services       Precautions / Restrictions Precautions Precautions: Fall Restrictions Weight Bearing Restrictions: No    Mobility  Bed Mobility Overal bed mobility: Needs Assistance Bed Mobility: Supine to Sit     Supine to sit: Min assist        Transfers Overall transfer level: Needs assistance Equipment used: Rolling walker (2 wheeled) Transfers: Sit to/from Stand Sit to Stand: Supervision            Ambulation/Gait Ambulation/Gait assistance: Supervision Gait Distance (Feet): 200 Feet Assistive device: Rolling walker (2 wheeled) Gait Pattern/deviations: Step-through pattern Gait velocity: decreased   General Gait Details: steady with no LOB   Stairs             Wheelchair Mobility    Modified Rankin (Stroke Patients Only)       Balance Overall balance assessment: Needs assistance Sitting-balance support: No upper extremity supported;Feet supported Sitting balance-Leahy Scale: Good     Standing balance support: Bilateral upper extremity supported Standing balance-Leahy Scale: Fair                              Cognition Arousal/Alertness: Awake/alert Behavior During Therapy: WFL for tasks assessed/performed Overall Cognitive Status: Within Functional Limits for tasks assessed                                         Exercises      General Comments        Pertinent Vitals/Pain Pain Assessment: No/denies pain    Home Living                      Prior Function            PT Goals (current goals can now be found in the care plan section) Progress towards PT goals: Progressing toward goals    Frequency    Min 2X/week      PT Plan Current plan remains appropriate    Co-evaluation              AM-PAC PT "6 Clicks" Mobility   Outcome Measure  Help needed turning from your back to your side while in a flat bed without using bedrails?: A Little Help needed moving from lying on your back to sitting on the side of a flat bed without using bedrails?: A Little Help needed moving to and from a bed to a chair (including a wheelchair)?: A Little Help needed standing up from a chair using your arms (e.g., wheelchair or bedside chair)?: A Little Help needed to walk in hospital room?: A Little Help needed climbing 3-5 steps with a railing? :  A Little 6 Click Score: 18    End of Session Equipment Utilized During Treatment: Gait belt Activity Tolerance: Patient tolerated treatment well Patient left: in bed;with call bell/phone within reach;with bed alarm set         Time: 4580-9983 PT Time Calculation (min) (ACUTE ONLY): 21 min  Charges:  $Gait Training: 8-22 mins                     Danielle Dess, PTA 03/25/18, 9:47 AM

## 2018-03-25 NOTE — Care Management Important Message (Signed)
Important Message  Patient Details  Name: [name] MRN: [id number] Date of Birth: 1933-12-18   Medicare Important Message Given:  Yes    [name] A Talulah Schirmer 03/25/2018, 10:53 AM

## 2018-06-21 ENCOUNTER — Ambulatory Visit: Payer: Medicare Other | Admitting: Obstetrics & Gynecology

## 2018-07-21 ENCOUNTER — Encounter: Payer: Self-pay | Admitting: Obstetrics & Gynecology

## 2018-07-21 ENCOUNTER — Other Ambulatory Visit: Payer: Self-pay

## 2018-07-21 ENCOUNTER — Ambulatory Visit (INDEPENDENT_AMBULATORY_CARE_PROVIDER_SITE_OTHER): Payer: Medicare Other | Admitting: Obstetrics & Gynecology

## 2018-07-21 VITALS — BP 150/80 | Ht 60.0 in | Wt 164.0 lb

## 2018-07-21 DIAGNOSIS — N812 Incomplete uterovaginal prolapse: Secondary | ICD-10-CM

## 2018-07-21 DIAGNOSIS — N8111 Cystocele, midline: Secondary | ICD-10-CM | POA: Diagnosis not present

## 2018-07-21 NOTE — Progress Notes (Signed)
  HPI:      [name] is a 85 y.o. [id number] who presents today for her pessary follow up and examination related to her pelvic floor weakening.  Pt reports tolerating the pessary well with  no vaginal bleeding and  no vaginal discharge.  Symptoms of pelvic floor weakening have greatly improved. She is voiding and defecating without difficulty. She currently has a gellhorn #3 pessary.  PMHx: She  has a past medical history of Carpal tunnel syndrome on both sides, Diabetes ([place]), Diabetic polyneuropathy ([place]), Diverticulitis, Hyperlipidemia, Hypertension, Osteoarthritis, and Vaginal prolapse. Also,  has a past surgical history that includes Ventral hernia repair; Cesarean section; Replacement total knee bilateral; pessary; and Ventral hernia repair (N/A, 01/13/2016)., family history includes Breast cancer in her maternal aunt; Breast cancer (age of onset: 18) in her mother.,  reports that she has never smoked. She has never used smokeless tobacco. She reports that she does not drink alcohol or use drugs.  She has a current medication list which includes the following prescription(s): acetaminophen, aspirin ec, glimepiride, lisinopril-hydrochlorothiazide, metformin, naproxen sodium, simvastatin, and sitagliptin. Also, has No Known Allergies.  Review of Systems  All other systems reviewed and are negative.   Objective: BP (!) 150/80   Ht 5' (1.524 m)   Wt 164 lb (74.4 kg)   BMI 32.03 kg/m  Physical Exam Constitutional:      General: She is not in acute distress.    Appearance: She is well-developed.  Genitourinary:     Pelvic exam was performed with patient supine.     Vagina and uterus normal.     No vaginal erythema or bleeding.     No cervical motion tenderness, discharge, polyp or nabothian cyst.     Uterus is mobile.     Uterus is not enlarged.     No uterine mass detected.    Uterus is midaxial.     No right or left adnexal mass present.     Right adnexa not tender.   Left adnexa not tender.  HENT:     Head: Normocephalic and atraumatic.     Nose: Nose normal.  Abdominal:     General: There is no distension.     Palpations: Abdomen is soft.     Tenderness: There is no abdominal tenderness.  Musculoskeletal: Normal range of motion.  Neurological:     Mental Status: She is alert and oriented to person, place, and time.     Cranial Nerves: No cranial nerve deficit.  Skin:    General: Skin is warm and dry.    Pessary Care Pessary removed and cleaned.  Vagina checked - without erosions - pessary replaced.  A/P: Pessary was cleaned and replaced today.  Some difficulty replacing, consider change to smaller size nv (#2 ordered) Instructions given for care. Concerning symptoms to observe for are counseled to patient. Follow up scheduled for 3 months.  A total of 15 minutes were spent face-to-face with the patient during this encounter and over half of that time dealt with counseling and coordination of care.  Barnett Applebaum, MD, [facility], [facility] 07/21/2018  10:22 AM

## 2018-08-23 ENCOUNTER — Telehealth: Payer: Self-pay | Admitting: Obstetrics & Gynecology

## 2018-08-23 NOTE — Telephone Encounter (Signed)
Pt had questions if she could have sex with pessary in , pt aware she would need to take it out,

## 2018-08-23 NOTE — Telephone Encounter (Signed)
Patient asking for cb from Dr. Kenton Kingfisher, has questions about her pessary.

## 2018-10-21 ENCOUNTER — Encounter: Payer: Self-pay | Admitting: Obstetrics & Gynecology

## 2018-10-21 ENCOUNTER — Other Ambulatory Visit: Payer: Self-pay

## 2018-10-21 ENCOUNTER — Ambulatory Visit (INDEPENDENT_AMBULATORY_CARE_PROVIDER_SITE_OTHER): Payer: Medicare Other | Admitting: Obstetrics & Gynecology

## 2018-10-21 VITALS — BP 140/80 | Ht 64.0 in | Wt 165.0 lb

## 2018-10-21 DIAGNOSIS — N812 Incomplete uterovaginal prolapse: Secondary | ICD-10-CM | POA: Diagnosis not present

## 2018-10-21 DIAGNOSIS — N8111 Cystocele, midline: Secondary | ICD-10-CM | POA: Diagnosis not present

## 2018-10-21 NOTE — Progress Notes (Signed)
  HPI:      [name] is a 85 y.o. [id number] who presents today for her pessary follow up and examination related to her pelvic floor weakening.  Pt reports tolerating the pessary well with  no vaginal bleeding and  no vaginal discharge.  Symptoms of pelvic floor weakening have greatly improved. She is voiding and defecating without difficulty. She currently has a Gellhorn #3 pessary.  There has been discomfort with its placement mostly, due to size.  PMHx: She  has a past medical history of Carpal tunnel syndrome on both sides, Diabetes ([place]), Diabetic polyneuropathy ([place]), Diverticulitis, Hyperlipidemia, Hypertension, Osteoarthritis, and Vaginal prolapse. Also,  has a past surgical history that includes Ventral hernia repair; Cesarean section; Replacement total knee bilateral; pessary; and Ventral hernia repair (N/A, 01/13/2016)., family history includes Breast cancer in her maternal aunt; Breast cancer (age of onset: 42) in her mother.,  reports that she has never smoked. She has never used smokeless tobacco. She reports that she does not drink alcohol or use drugs.  She has a current medication list which includes the following prescription(s): acetaminophen, aspirin ec, glimepiride, metformin, naproxen sodium, simvastatin, sitagliptin, and lisinopril-hydrochlorothiazide. Also, has No Known Allergies.  Review of Systems  All other systems reviewed and are negative.   Objective: BP 140/80   Ht 5\' 4"  (1.626 m)   Wt 165 lb (74.8 kg)   BMI 28.32 kg/m  Physical Exam Constitutional:      General: She is not in acute distress.    Appearance: She is well-developed.  Genitourinary:     Pelvic exam was performed with patient supine.     Vagina and uterus normal.     No vaginal erythema or bleeding.     No cervical motion tenderness, discharge, polyp or nabothian cyst.     Uterus is mobile.     Uterus is not enlarged.     No uterine mass detected.    Uterus is midaxial.     No right  or left adnexal mass present.     Right adnexa not tender.     Left adnexa not tender.     Genitourinary Comments: Cystocele and uterine descensus present Pessary tight fit  HENT:     Head: Normocephalic and atraumatic.     Nose: Nose normal.  Abdominal:     General: There is no distension.     Palpations: Abdomen is soft.     Tenderness: There is no abdominal tenderness.  Musculoskeletal: Normal range of motion.  Neurological:     Mental Status: She is alert and oriented to person, place, and time.     Cranial Nerves: No cranial nerve deficit.  Skin:    General: Skin is warm and dry.     Pessary Care Pessary removed and cleaned.  Vagina checked - without erosions - pessary replaced with a smaller size - #2 Gellhorn.  A/P:   ICD-10-CM   1. Incomplete uterine prolapse  N81.2   2. Cystocele, midline  N81.11    Pessary was cleaned and replaced with a smaller size today. Instructions given for care. Concerning symptoms to observe for are counseled to patient. Follow up scheduled for 3 months.  A total of 15 minutes were spent face-to-face with the patient during this encounter and over half of that time dealt with counseling and coordination of care.  Barnett Applebaum, MD, [facility], [facility] 10/21/2018  9:32 AM

## 2019-01-24 ENCOUNTER — Ambulatory Visit: Payer: Medicare Other | Admitting: Obstetrics & Gynecology

## 2019-02-03 ENCOUNTER — Encounter: Payer: Self-pay | Admitting: Obstetrics & Gynecology

## 2019-02-03 ENCOUNTER — Ambulatory Visit (INDEPENDENT_AMBULATORY_CARE_PROVIDER_SITE_OTHER): Payer: Medicare Other | Admitting: Obstetrics & Gynecology

## 2019-02-03 ENCOUNTER — Other Ambulatory Visit: Payer: Self-pay

## 2019-02-03 VITALS — BP 160/90 | Ht 60.0 in | Wt 170.0 lb

## 2019-02-03 DIAGNOSIS — N812 Incomplete uterovaginal prolapse: Secondary | ICD-10-CM

## 2019-02-03 DIAGNOSIS — N8111 Cystocele, midline: Secondary | ICD-10-CM

## 2019-02-03 NOTE — Progress Notes (Signed)
HPI:      [name] is a 85 y.o. [id number] who presents today for her pessary follow up and examination related to her pelvic floor weakening.  Pt reports tolerating the pessary well with  no vaginal bleeding and  no vaginal discharge.  Symptoms of pelvic floor weakening have greatly improved. She is voiding and defecating without difficulty. She currently has a #3 Gellhorn pessary that has worked well and better than others in the past.  PMHx: She  has a past medical history of Carpal tunnel syndrome on both sides, Diabetes ([facility]), Diabetic polyneuropathy ([facility]), Diverticulitis, Hyperlipidemia, Hypertension, Osteoarthritis, and Vaginal prolapse. Also,  has a past surgical history that includes Ventral hernia repair; Cesarean section; Replacement total knee bilateral; pessary; and Ventral hernia repair (N/A, 01/13/2016)., family history includes Breast cancer in her maternal aunt; Breast cancer (age of onset: 83) in her mother.,  reports that she has never smoked. She has never used smokeless tobacco. She reports that she does not drink alcohol or use drugs.  She has a current medication list which includes the following prescription(s): acetaminophen, aspirin ec, glimepiride, metformin, naproxen sodium, simvastatin, sitagliptin, and lisinopril-hydrochlorothiazide. Also, has No Known Allergies.  Review of Systems  All other systems reviewed and are negative.   Objective: BP (!) 160/90   Ht 5' (1.524 m)   Wt 170 lb (77.1 kg)   BMI 33.20 kg/m  Physical Exam Constitutional:      General: She is not in acute distress.    Appearance: She is well-developed.  Genitourinary:     Pelvic exam was performed with patient supine.     Vagina and uterus normal.     No vaginal erythema or bleeding.     No cervical motion tenderness, discharge, polyp or nabothian cyst.     Uterus is mobile.     Uterus is not enlarged.     No uterine mass detected.    Uterus is midaxial.     No right or left  adnexal mass present.     Right adnexa not tender.     Left adnexa not tender.     Genitourinary Comments: Vag wall weakening, cystolcele, and uterine prolapse noted  HENT:     Head: Normocephalic and atraumatic.     Nose: Nose normal.  Abdominal:     General: There is no distension.     Palpations: Abdomen is soft.     Tenderness: There is no abdominal tenderness.  Musculoskeletal:        General: Normal range of motion.  Neurological:     Mental Status: She is alert and oriented to person, place, and time.     Cranial Nerves: No cranial nerve deficit.  Skin:    General: Skin is warm and dry.  Psychiatric:        Attention and Perception: Attention normal.        Mood and Affect: Mood and affect normal.        Speech: Speech normal.        Behavior: Behavior normal.        Thought Content: Thought content normal.        Judgment: Judgment normal.     Pessary Care Pessary removed and cleaned.  Vagina checked - without erosions - pessary replaced.  A/P:   ICD-10-CM   1. Incomplete uterine prolapse  N81.2   2. Cystocele, midline  N81.11   Pessary was cleaned and replaced today. Instructions given for care. Concerning symptoms  to observe for are counseled to patient. Follow up scheduled for 3 months.  A total of 15 minutes were spent face-to-face with the patient during this encounter and over half of that time dealt with counseling and coordination of care.  Barnett Applebaum, MD, [facility], [facility] 02/03/2019  11:02 AM

## 2019-03-22 ENCOUNTER — Telehealth: Payer: Self-pay

## 2019-03-22 NOTE — Telephone Encounter (Signed)
Attempt to reach patient to schedule appointment. Phone number on file has no voicemail set up unable to leave message

## 2019-03-22 NOTE — Telephone Encounter (Signed)
Needs appt for UA and discussion of risks/benefits of certain therapy for urinary concerns

## 2019-03-22 NOTE — Telephone Encounter (Signed)
Pt's daughter calling stating her mom has been urinating frequently. She is wanting to know if there is something you can call in or does she have to come in for an appt? Please advise when you can. Thank you.

## 2019-04-15 DIAGNOSIS — R809 Proteinuria, unspecified: Secondary | ICD-10-CM | POA: Diagnosis not present

## 2019-04-15 DIAGNOSIS — E1129 Type 2 diabetes mellitus with other diabetic kidney complication: Secondary | ICD-10-CM | POA: Diagnosis not present

## 2019-04-27 DIAGNOSIS — Z0000 Encounter for general adult medical examination without abnormal findings: Secondary | ICD-10-CM | POA: Diagnosis not present

## 2019-04-27 DIAGNOSIS — E1129 Type 2 diabetes mellitus with other diabetic kidney complication: Secondary | ICD-10-CM | POA: Diagnosis not present

## 2019-04-27 DIAGNOSIS — Z1389 Encounter for screening for other disorder: Secondary | ICD-10-CM | POA: Diagnosis not present

## 2019-04-27 DIAGNOSIS — E1142 Type 2 diabetes mellitus with diabetic polyneuropathy: Secondary | ICD-10-CM | POA: Diagnosis not present

## 2019-04-27 DIAGNOSIS — M2550 Pain in unspecified joint: Secondary | ICD-10-CM | POA: Diagnosis not present

## 2019-04-27 DIAGNOSIS — R809 Proteinuria, unspecified: Secondary | ICD-10-CM | POA: Diagnosis not present

## 2019-04-27 DIAGNOSIS — I10 Essential (primary) hypertension: Secondary | ICD-10-CM | POA: Diagnosis not present

## 2019-05-05 ENCOUNTER — Encounter: Payer: Self-pay | Admitting: Obstetrics & Gynecology

## 2019-05-05 ENCOUNTER — Other Ambulatory Visit: Payer: Self-pay

## 2019-05-05 ENCOUNTER — Ambulatory Visit: Payer: Medicare Other | Admitting: Obstetrics & Gynecology

## 2019-05-05 VITALS — BP 140/80 | Ht 64.0 in | Wt 166.0 lb

## 2019-05-05 DIAGNOSIS — N898 Other specified noninflammatory disorders of vagina: Secondary | ICD-10-CM | POA: Diagnosis not present

## 2019-05-05 DIAGNOSIS — N39498 Other specified urinary incontinence: Secondary | ICD-10-CM | POA: Diagnosis not present

## 2019-05-05 DIAGNOSIS — N8111 Cystocele, midline: Secondary | ICD-10-CM | POA: Diagnosis not present

## 2019-05-05 DIAGNOSIS — N812 Incomplete uterovaginal prolapse: Secondary | ICD-10-CM

## 2019-05-05 MED ORDER — CLOTRIMAZOLE-BETAMETHASONE 1-0.05 % EX CREA: 1.0000 "application " | TOPICAL_CREAM | Freq: Two times a day (BID) | CUTANEOUS | 0 refills | Status: DC

## 2019-05-05 NOTE — Progress Notes (Signed)
HPI:      [name] is a 86 y.o. [id number] who presents today for her pessary follow up and examination related to her pelvic floor weakening.  Pt reports tolerating the pessary well with no vaginal bleeding and no vaginal discharge.  Reports VULVAR discomfort w itching due to urinary leakage often there.  Symptoms of pelvic floor weakening have greatly improved. She is voiding and defecating without difficulty. She currently has a Gellhorn #3 pessary.  PMHx: She  has a past medical history of Carpal tunnel syndrome on both sides, Diabetes ([place]), Diabetic polyneuropathy ([place]), Diverticulitis, Hyperlipidemia, Hypertension, Osteoarthritis, and Vaginal prolapse. Also,  has a past surgical history that includes Ventral hernia repair; Cesarean section; Replacement total knee bilateral; pessary; and Ventral hernia repair (N/A, 01/13/2016)., family history includes Breast cancer in her maternal aunt; Breast cancer (age of onset: 13) in her mother.,  reports that she has never smoked. She has never used smokeless tobacco. She reports that she does not drink alcohol or use drugs.  She has a current medication list which includes the following prescription(s): acetaminophen, aspirin ec, glimepiride, metformin, naproxen sodium, simvastatin, sitagliptin, clotrimazole-betamethasone, and lisinopril-hydrochlorothiazide. Also, has No Known Allergies.  Review of Systems  All other systems reviewed and are negative.   Objective: BP 140/80   Ht 5\' 4"  (1.626 m)   Wt 166 lb (75.3 kg)   BMI 28.49 kg/m  Physical Exam Constitutional:      General: She is not in acute distress.    Appearance: She is well-developed.  Genitourinary:     Pelvic exam was performed with patient supine.     Vagina and uterus normal.     No vaginal erythema or bleeding.     No cervical motion tenderness, discharge, polyp or nabothian cyst.     Uterus is mobile.     Uterus is not enlarged.     No uterine mass detected.  Uterus is midaxial.     No right or left adnexal mass present.     Right adnexa not tender.     Left adnexa not tender.     Genitourinary Comments: Vag wall weakening, cystolcele, and uterine prolapse noted  Vulvar irritation noted  HENT:     Head: Normocephalic and atraumatic.     Nose: Nose normal.  Abdominal:     General: There is no distension.     Palpations: Abdomen is soft.     Tenderness: There is no abdominal tenderness.  Musculoskeletal:        General: Normal range of motion.  Neurological:     Mental Status: She is alert and oriented to person, place, and time.     Cranial Nerves: No cranial nerve deficit.  Skin:    General: Skin is warm and dry.  Psychiatric:        Attention and Perception: Attention normal.        Mood and Affect: Mood and affect normal.        Speech: Speech normal.        Behavior: Behavior normal.        Thought Content: Thought content normal.        Judgment: Judgment normal.    Pessary Care Pessary removed and cleaned.  Vagina checked - without erosions - pessary replaced.  A/P:1. Incomplete uterine prolapse 2. Cystocele, midline 3. Urinary Incontinence Worsening, causing vulvar dyscomfort w itching as well Lotrisone prescribed Prefers no surgery (discussed Ant Repair +/1 Sling option) Modify po intake, lessen  soda intake  Pessary was cleaned and replaced today. Instructions given for care. Concerning symptoms to observe for are counseled to patient. Follow up scheduled for 3 months.  A total of 20 minutes were spent face-to-face with the patient as well as preparation, review, communication, and documentation during this encounter.   Annamarie Major, MD, [facility], [facility] 05/05/2019  10:00 AM

## 2019-07-25 DIAGNOSIS — E1129 Type 2 diabetes mellitus with other diabetic kidney complication: Secondary | ICD-10-CM | POA: Diagnosis not present

## 2019-07-25 DIAGNOSIS — R809 Proteinuria, unspecified: Secondary | ICD-10-CM | POA: Diagnosis not present

## 2019-08-01 DIAGNOSIS — Z79899 Other long term (current) drug therapy: Secondary | ICD-10-CM | POA: Diagnosis not present

## 2019-08-01 DIAGNOSIS — E7800 Pure hypercholesterolemia, unspecified: Secondary | ICD-10-CM | POA: Diagnosis not present

## 2019-08-01 DIAGNOSIS — R809 Proteinuria, unspecified: Secondary | ICD-10-CM | POA: Diagnosis not present

## 2019-08-01 DIAGNOSIS — I10 Essential (primary) hypertension: Secondary | ICD-10-CM | POA: Diagnosis not present

## 2019-08-01 DIAGNOSIS — R42 Dizziness and giddiness: Secondary | ICD-10-CM | POA: Diagnosis not present

## 2019-08-01 DIAGNOSIS — E1142 Type 2 diabetes mellitus with diabetic polyneuropathy: Secondary | ICD-10-CM | POA: Diagnosis not present

## 2019-08-01 DIAGNOSIS — E1129 Type 2 diabetes mellitus with other diabetic kidney complication: Secondary | ICD-10-CM | POA: Diagnosis not present

## 2019-08-01 DIAGNOSIS — Z7984 Long term (current) use of oral hypoglycemic drugs: Secondary | ICD-10-CM | POA: Diagnosis not present

## 2019-08-08 ENCOUNTER — Other Ambulatory Visit: Payer: Self-pay

## 2019-08-08 ENCOUNTER — Ambulatory Visit: Payer: Medicare HMO | Admitting: Obstetrics & Gynecology

## 2019-08-08 ENCOUNTER — Encounter: Payer: Self-pay | Admitting: Obstetrics & Gynecology

## 2019-08-08 VITALS — BP 140/80 | Ht 64.0 in | Wt 160.0 lb

## 2019-08-08 DIAGNOSIS — R300 Dysuria: Secondary | ICD-10-CM

## 2019-08-08 DIAGNOSIS — N812 Incomplete uterovaginal prolapse: Secondary | ICD-10-CM

## 2019-08-08 DIAGNOSIS — N8111 Cystocele, midline: Secondary | ICD-10-CM | POA: Diagnosis not present

## 2019-08-08 DIAGNOSIS — N3000 Acute cystitis without hematuria: Secondary | ICD-10-CM

## 2019-08-08 LAB — POCT URINALYSIS DIPSTICK
Bilirubin, UA: NEGATIVE
Blood, UA: NEGATIVE
Glucose, UA: NEGATIVE
Ketones, UA: NEGATIVE
Nitrite, UA: POSITIVE
Protein, UA: NEGATIVE
Spec Grav, UA: 1.01 (ref 1.010–1.025)
Urobilinogen, UA: 0.2 E.U./dL
pH, UA: 5 (ref 5.0–8.0)

## 2019-08-08 MED ORDER — CLOTRIMAZOLE-BETAMETHASONE 1-0.05 % EX CREA: 1.0000 "application " | TOPICAL_CREAM | Freq: Two times a day (BID) | CUTANEOUS | 5 refills | Status: DC

## 2019-08-08 MED ORDER — DICLOXACILLIN SODIUM 500 MG PO CAPS: 500.0000 mg | ORAL_CAPSULE | Freq: Four times a day (QID) | ORAL | 0 refills | Status: AC

## 2019-08-08 NOTE — Progress Notes (Signed)
HPI:      [name] is a 86 y.o. [id number] who presents today for her pessary follow up and examination related to her pelvic floor weakening.  Pt reports tolerating the pessary well with  no vaginal bleeding and  no vaginal discharge.  Symptoms of pelvic floor weakening have greatly improved. She is voiding and defecating without difficulty. She currently has a Gellhorn #3 pessary.  Pt reports UTI like sx's.  PMHx: She  has a past medical history of Carpal tunnel syndrome on both sides, Diabetes ([place]), Diabetic polyneuropathy ([place]), Diverticulitis, Hyperlipidemia, Hypertension, Osteoarthritis, and Vaginal prolapse. Also,  has a past surgical history that includes Ventral hernia repair; Cesarean section; Replacement total knee bilateral; pessary; and Ventral hernia repair (N/A, 01/13/2016)., family history includes Breast cancer in her maternal aunt; Breast cancer (age of onset: 58) in her mother.,  reports that she has never smoked. She has never used smokeless tobacco. She reports that she does not drink alcohol and does not use drugs.  She has a current medication list which includes the following prescription(s): acetaminophen, aspirin ec, clotrimazole-betamethasone, glimepiride, metformin, naproxen sodium, simvastatin, sitagliptin, and lisinopril-hydrochlorothiazide. Also, has No Known Allergies.  Review of Systems  All other systems reviewed and are negative.   Objective: BP 140/80   Ht 5\' 4"  (1.626 m)   Wt 160 lb (72.6 kg)   BMI 27.46 kg/m  Physical Exam Constitutional:      General: She is not in acute distress.    Appearance: She is well-developed.  Genitourinary:     Pelvic exam was performed with patient supine.     Vagina and uterus normal.     No vaginal erythema or bleeding.     No cervical motion tenderness, discharge, polyp or nabothian cyst.     Uterus is mobile.     Uterus is not enlarged.     No uterine mass detected.    Uterus is midaxial.     No right  or left adnexal mass present.     Right adnexa not tender.     Left adnexa not tender.     Genitourinary Comments: Vag wall weakening, cystolcele, and uterine prolapse noted   HENT:     Head: Normocephalic and atraumatic.     Nose: Nose normal.  Abdominal:     General: There is no distension.     Palpations: Abdomen is soft.     Tenderness: There is no abdominal tenderness.  Musculoskeletal:        General: Normal range of motion.  Neurological:     Mental Status: She is alert and oriented to person, place, and time.     Cranial Nerves: No cranial nerve deficit.  Skin:    General: Skin is warm and dry.  Psychiatric:        Attention and Perception: Attention normal.        Mood and Affect: Mood and affect normal.        Speech: Speech normal.        Behavior: Behavior normal.        Thought Content: Thought content normal.        Judgment: Judgment normal.     Pessary Care Pessary removed and cleaned.  Vagina checked - without erosions - pessary replaced.  Results for orders placed or performed in visit on 08/08/19  POCT urinalysis dipstick  Result Value Ref Range   Color, UA     Clarity, UA     Glucose,  UA Negative Negative   Bilirubin, UA neg    Ketones, UA neg    Spec Grav, UA 1.010 1.010 - 1.025   Blood, UA neg    pH, UA 5.0 5.0 - 8.0   Protein, UA Negative Negative   Urobilinogen, UA 0.2 0.2 or 1.0 E.U./dL   Nitrite, UA Positive    Leukocytes, UA Small (1+) (A) Negative   Appearance     Odor      A/P:   ICD-10-CM   1. Incomplete uterine prolapse  N81.2   2. Cystocele, midline  N81.11   3. Dysuria  R30.0 POCT urinalysis dipstick  4. Acute cystitis without hematuria  N30.00    Pessary was cleaned and replaced today. Instructions given for care. Concerning symptoms to observe for are counseled to patient. Follow up scheduled for 3 months.  Treat UTI, dicloxicillin  Also Lotrisone refill  A total of 20 minutes were spent face-to-face with the patient  as well as preparation, review, communication, and documentation during this encounter.   Annamarie Major, MD, [facility], [facility] 08/08/2019  9:30 AM

## 2019-09-06 ENCOUNTER — Encounter: Payer: Self-pay | Admitting: Obstetrics & Gynecology

## 2019-09-06 ENCOUNTER — Other Ambulatory Visit: Payer: Self-pay

## 2019-09-06 ENCOUNTER — Ambulatory Visit (INDEPENDENT_AMBULATORY_CARE_PROVIDER_SITE_OTHER): Payer: Medicare Other | Admitting: Obstetrics & Gynecology

## 2019-09-06 VITALS — BP 130/80 | Ht 60.0 in | Wt 168.0 lb

## 2019-09-06 DIAGNOSIS — N812 Incomplete uterovaginal prolapse: Secondary | ICD-10-CM

## 2019-09-06 DIAGNOSIS — N8111 Cystocele, midline: Secondary | ICD-10-CM

## 2019-09-06 NOTE — Progress Notes (Signed)
HPI:      [name] is a 86 y.o. [id number] who presents today for her pessary follow up and examination related to her pelvic floor weakening.  Pt reports tolerating the pessary yet with light vaginal bleeding recently and expulsion 2 days ago.  Symptoms of pelvic floor weakening have greatly improved, but also feels OK w it out now.  She last had holiday from pessary 2019. She currently has been using a Gellhorn #3 pessary.  PMHx: She  has a past medical history of Carpal tunnel syndrome on both sides, Diabetes (HCC), Diabetic polyneuropathy (HCC), Diverticulitis, Hyperlipidemia, Hypertension, Osteoarthritis, and Vaginal prolapse. Also,  has a past surgical history that includes Ventral hernia repair; Cesarean section; Replacement total knee bilateral; pessary; and Ventral hernia repair (N/A, 01/13/2016)., family history includes Breast cancer in her maternal aunt; Breast cancer (age of onset: 71) in her mother.,  reports that she has never smoked. She has never used smokeless tobacco. She reports that she does not drink alcohol and does not use drugs.  She has a current medication list which includes the following prescription(s): acetaminophen, aspirin ec, clotrimazole-betamethasone, glimepiride, metformin, naproxen sodium, simvastatin, sitagliptin, and lisinopril-hydrochlorothiazide. Also, has No Known Allergies.  Review of Systems  Constitutional: Negative for chills, fever and malaise/fatigue.  HENT: Negative for congestion, sinus pain and sore throat.   Eyes: Negative for blurred vision and pain.  Respiratory: Negative for cough and wheezing.   Cardiovascular: Negative for chest pain and leg swelling.  Gastrointestinal: Negative for abdominal pain, constipation, diarrhea, heartburn, nausea and vomiting.  Genitourinary: Negative for dysuria, frequency, hematuria and urgency.  Musculoskeletal: Negative for back pain, joint pain, myalgias and neck pain.  Skin: Negative for itching and  rash.  Neurological: Negative for dizziness, tremors and weakness.  Endo/Heme/Allergies: Does not bruise/bleed easily.  Psychiatric/Behavioral: Negative for depression. The patient is not nervous/anxious and does not have insomnia.     Objective: BP (!) 130/80    Ht 5' (1.524 m)    Wt 168 lb (76.2 kg)    BMI 32.81 kg/m  Physical Exam Constitutional:      General: She is not in acute distress.    Appearance: She is well-developed.  Genitourinary:     Pelvic exam was performed with patient supine.     Vagina and uterus normal.     No vaginal erythema or bleeding.     No cervical motion tenderness, discharge, polyp or nabothian cyst.     Uterus is mobile.     Uterus is not enlarged.     No uterine mass detected.    Uterus is midaxial.     No right or left adnexal mass present.     Right adnexa not tender.     Left adnexa not tender.     Genitourinary Comments: No erosions or ulcerations.  Scant bleeding noted.  HENT:     Head: Normocephalic and atraumatic.     Nose: Nose normal.  Abdominal:     General: There is no distension.     Palpations: Abdomen is soft.     Tenderness: There is no abdominal tenderness.  Musculoskeletal:        General: Normal range of motion.  Neurological:     Mental Status: She is alert and oriented to person, place, and time.     Cranial Nerves: No cranial nerve deficit.  Skin:    General: Skin is warm and dry.  Psychiatric:        Attention  and Perception: Attention normal.        Mood and Affect: Mood and affect normal.        Speech: Speech normal.        Behavior: Behavior normal.        Thought Content: Thought content normal.        Judgment: Judgment normal.    A/P:   ICD-10-CM   1. Incomplete uterine prolapse  N81.2   2. Cystocele, midline  N81.11    Pessary holiday advised.  Will leave out and follow symptoms closely.  Replace if sx's return.  Pt prefers holiday indefinately.  A total of 20 minutes were spent face-to-face with  the patient as well as preparation, review, communication, and documentation during this encounter.   Annamarie Major, MD, [facility], [facility] 09/06/2019  2:32 PM

## 2019-09-19 ENCOUNTER — Other Ambulatory Visit: Payer: Self-pay

## 2019-09-19 ENCOUNTER — Encounter: Payer: Self-pay | Admitting: *Deleted

## 2019-09-19 DIAGNOSIS — R0600 Dyspnea, unspecified: Secondary | ICD-10-CM | POA: Diagnosis not present

## 2019-09-19 DIAGNOSIS — E782 Mixed hyperlipidemia: Secondary | ICD-10-CM | POA: Diagnosis not present

## 2019-09-19 DIAGNOSIS — E1151 Type 2 diabetes mellitus with diabetic peripheral angiopathy without gangrene: Secondary | ICD-10-CM | POA: Diagnosis present

## 2019-09-19 DIAGNOSIS — M1990 Unspecified osteoarthritis, unspecified site: Secondary | ICD-10-CM | POA: Diagnosis not present

## 2019-09-19 DIAGNOSIS — G9341 Metabolic encephalopathy: Secondary | ICD-10-CM | POA: Diagnosis present

## 2019-09-19 DIAGNOSIS — R262 Difficulty in walking, not elsewhere classified: Secondary | ICD-10-CM | POA: Diagnosis present

## 2019-09-19 DIAGNOSIS — I160 Hypertensive urgency: Secondary | ICD-10-CM | POA: Diagnosis present

## 2019-09-19 DIAGNOSIS — Z20822 Contact with and (suspected) exposure to covid-19: Secondary | ICD-10-CM | POA: Diagnosis present

## 2019-09-19 DIAGNOSIS — I6523 Occlusion and stenosis of bilateral carotid arteries: Secondary | ICD-10-CM | POA: Diagnosis present

## 2019-09-19 DIAGNOSIS — I6501 Occlusion and stenosis of right vertebral artery: Secondary | ICD-10-CM | POA: Diagnosis not present

## 2019-09-19 DIAGNOSIS — I6389 Other cerebral infarction: Secondary | ICD-10-CM | POA: Diagnosis not present

## 2019-09-19 DIAGNOSIS — R600 Localized edema: Secondary | ICD-10-CM | POA: Diagnosis not present

## 2019-09-19 DIAGNOSIS — M79661 Pain in right lower leg: Secondary | ICD-10-CM | POA: Diagnosis not present

## 2019-09-19 DIAGNOSIS — R5381 Other malaise: Secondary | ICD-10-CM | POA: Diagnosis not present

## 2019-09-19 DIAGNOSIS — R471 Dysarthria and anarthria: Secondary | ICD-10-CM | POA: Diagnosis not present

## 2019-09-19 DIAGNOSIS — I6623 Occlusion and stenosis of bilateral posterior cerebral arteries: Secondary | ICD-10-CM | POA: Diagnosis not present

## 2019-09-19 DIAGNOSIS — N3000 Acute cystitis without hematuria: Principal | ICD-10-CM | POA: Diagnosis present

## 2019-09-19 DIAGNOSIS — E876 Hypokalemia: Secondary | ICD-10-CM | POA: Diagnosis not present

## 2019-09-19 DIAGNOSIS — R29710 NIHSS score 10: Secondary | ICD-10-CM | POA: Diagnosis present

## 2019-09-19 DIAGNOSIS — Z6832 Body mass index (BMI) 32.0-32.9, adult: Secondary | ICD-10-CM

## 2019-09-19 DIAGNOSIS — Z79899 Other long term (current) drug therapy: Secondary | ICD-10-CM | POA: Diagnosis not present

## 2019-09-19 DIAGNOSIS — I6602 Occlusion and stenosis of left middle cerebral artery: Secondary | ICD-10-CM | POA: Diagnosis not present

## 2019-09-19 DIAGNOSIS — G5603 Carpal tunnel syndrome, bilateral upper limbs: Secondary | ICD-10-CM | POA: Diagnosis present

## 2019-09-19 DIAGNOSIS — Z7984 Long term (current) use of oral hypoglycemic drugs: Secondary | ICD-10-CM

## 2019-09-19 DIAGNOSIS — I639 Cerebral infarction, unspecified: Secondary | ICD-10-CM | POA: Diagnosis not present

## 2019-09-19 DIAGNOSIS — E1142 Type 2 diabetes mellitus with diabetic polyneuropathy: Secondary | ICD-10-CM | POA: Diagnosis present

## 2019-09-19 DIAGNOSIS — R29810 Facial weakness: Secondary | ICD-10-CM | POA: Diagnosis not present

## 2019-09-19 DIAGNOSIS — G8194 Hemiplegia, unspecified affecting left nondominant side: Secondary | ICD-10-CM | POA: Diagnosis not present

## 2019-09-19 DIAGNOSIS — N3001 Acute cystitis with hematuria: Secondary | ICD-10-CM | POA: Diagnosis not present

## 2019-09-19 DIAGNOSIS — Z96653 Presence of artificial knee joint, bilateral: Secondary | ICD-10-CM | POA: Diagnosis present

## 2019-09-19 DIAGNOSIS — N390 Urinary tract infection, site not specified: Secondary | ICD-10-CM | POA: Diagnosis not present

## 2019-09-19 DIAGNOSIS — R9082 White matter disease, unspecified: Secondary | ICD-10-CM | POA: Diagnosis not present

## 2019-09-19 DIAGNOSIS — G319 Degenerative disease of nervous system, unspecified: Secondary | ICD-10-CM | POA: Diagnosis not present

## 2019-09-19 DIAGNOSIS — M19012 Primary osteoarthritis, left shoulder: Secondary | ICD-10-CM | POA: Diagnosis not present

## 2019-09-19 DIAGNOSIS — M2550 Pain in unspecified joint: Secondary | ICD-10-CM | POA: Diagnosis not present

## 2019-09-19 DIAGNOSIS — I69351 Hemiplegia and hemiparesis following cerebral infarction affecting right dominant side: Secondary | ICD-10-CM | POA: Diagnosis not present

## 2019-09-19 DIAGNOSIS — I6521 Occlusion and stenosis of right carotid artery: Secondary | ICD-10-CM | POA: Diagnosis not present

## 2019-09-19 DIAGNOSIS — E785 Hyperlipidemia, unspecified: Secondary | ICD-10-CM | POA: Diagnosis not present

## 2019-09-19 DIAGNOSIS — R630 Anorexia: Secondary | ICD-10-CM | POA: Diagnosis present

## 2019-09-19 DIAGNOSIS — I7090 Unspecified atherosclerosis: Secondary | ICD-10-CM | POA: Diagnosis not present

## 2019-09-19 DIAGNOSIS — Z7401 Bed confinement status: Secondary | ICD-10-CM | POA: Diagnosis not present

## 2019-09-19 DIAGNOSIS — R42 Dizziness and giddiness: Secondary | ICD-10-CM | POA: Diagnosis not present

## 2019-09-19 DIAGNOSIS — Z7982 Long term (current) use of aspirin: Secondary | ICD-10-CM

## 2019-09-19 DIAGNOSIS — I10 Essential (primary) hypertension: Secondary | ICD-10-CM | POA: Diagnosis present

## 2019-09-19 DIAGNOSIS — R0602 Shortness of breath: Secondary | ICD-10-CM | POA: Diagnosis not present

## 2019-09-19 DIAGNOSIS — I2510 Atherosclerotic heart disease of native coronary artery without angina pectoris: Secondary | ICD-10-CM | POA: Diagnosis not present

## 2019-09-19 DIAGNOSIS — M19042 Primary osteoarthritis, left hand: Secondary | ICD-10-CM | POA: Diagnosis not present

## 2019-09-19 LAB — BASIC METABOLIC PANEL
Anion gap: 8 (ref 5–15)
BUN: 15 mg/dL (ref 8–23)
CO2: 28 mmol/L (ref 22–32)
Calcium: 9.3 mg/dL (ref 8.9–10.3)
Chloride: 103 mmol/L (ref 98–111)
Creatinine, Ser: 0.75 mg/dL (ref 0.44–1.00)
GFR calc Af Amer: >60 mL/min (ref >60)
GFR calc non Af Amer: >60 mL/min (ref >60)
Glucose, Bld: 234 mg/dL — ABNORMAL HIGH (ref 70–99)
Potassium: 4.4 mmol/L (ref 3.5–5.1)
Sodium: 139 mmol/L (ref 135–145)

## 2019-09-19 LAB — URINALYSIS, COMPLETE (UACMP) WITH MICROSCOPIC
Bilirubin Urine: NEGATIVE
Glucose, UA: >=500 mg/dL — AB
Ketones, ur: NEGATIVE mg/dL
Nitrite: POSITIVE — AB
Protein, ur: 100 mg/dL — AB
Specific Gravity, Urine: 1.021 (ref 1.005–1.030)
pH: 5 (ref 5.0–8.0)

## 2019-09-19 LAB — CBC
HCT: 38.1 % (ref 36.0–46.0)
Hemoglobin: 12.2 g/dL (ref 12.0–15.0)
MCH: 31.3 pg (ref 26.0–34.0)
MCHC: 32 g/dL (ref 30.0–36.0)
MCV: 97.7 fL (ref 80.0–100.0)
Platelets: 242 10*3/uL (ref 150–400)
RBC: 3.9 MIL/uL (ref 3.87–5.11)
RDW: 12.3 % (ref 11.5–15.5)
WBC: 10.1 10*3/uL (ref 4.0–10.5)
nRBC: 0 % (ref 0.0–0.2)

## 2019-09-19 LAB — GLUCOSE, CAPILLARY: Glucose-Capillary: 192 mg/dL — ABNORMAL HIGH (ref 70–99)

## 2019-09-19 NOTE — ED Triage Notes (Signed)
Pt to ED with multiple medical complaints:   Dizziness starting yesterday. Right leg pain starting suddenly in her thigh today that has not subsided. Hx of knee replacement to the right knee. No new injuries. Pt also reports she has been urinating more than normal. No dysuria or hematuria.

## 2019-09-20 ENCOUNTER — Inpatient Hospital Stay
Admission: EM | Admit: 2019-09-20 | Discharge: 2019-09-27 | DRG: 689 | Disposition: A | Discharge status: Skilled Nursing Facility | Payer: Medicare HMO | Attending: Internal Medicine | Admitting: Internal Medicine

## 2019-09-20 ENCOUNTER — Emergency Department: Payer: Medicare HMO

## 2019-09-20 ENCOUNTER — Observation Stay: Payer: Medicare HMO

## 2019-09-20 ENCOUNTER — Encounter: Payer: Self-pay | Admitting: Internal Medicine

## 2019-09-20 DIAGNOSIS — R0600 Dyspnea, unspecified: Secondary | ICD-10-CM

## 2019-09-20 DIAGNOSIS — E782 Mixed hyperlipidemia: Secondary | ICD-10-CM

## 2019-09-20 DIAGNOSIS — E1142 Type 2 diabetes mellitus with diabetic polyneuropathy: Secondary | ICD-10-CM | POA: Diagnosis not present

## 2019-09-20 DIAGNOSIS — N3001 Acute cystitis with hematuria: Secondary | ICD-10-CM | POA: Diagnosis not present

## 2019-09-20 DIAGNOSIS — I160 Hypertensive urgency: Secondary | ICD-10-CM

## 2019-09-20 DIAGNOSIS — N390 Urinary tract infection, site not specified: Secondary | ICD-10-CM

## 2019-09-20 DIAGNOSIS — N3000 Acute cystitis without hematuria: Secondary | ICD-10-CM

## 2019-09-20 DIAGNOSIS — R52 Pain, unspecified: Secondary | ICD-10-CM

## 2019-09-20 DIAGNOSIS — I10 Essential (primary) hypertension: Secondary | ICD-10-CM | POA: Diagnosis present

## 2019-09-20 DIAGNOSIS — I639 Cerebral infarction, unspecified: Secondary | ICD-10-CM

## 2019-09-20 DIAGNOSIS — G9341 Metabolic encephalopathy: Secondary | ICD-10-CM

## 2019-09-20 HISTORY — DX: Urinary tract infection, site not specified: N39.0

## 2019-09-20 LAB — GLUCOSE, CAPILLARY
Glucose-Capillary: 195 mg/dL — ABNORMAL HIGH (ref 70–99)
Glucose-Capillary: 196 mg/dL — ABNORMAL HIGH (ref 70–99)
Glucose-Capillary: 142 mg/dL — ABNORMAL HIGH (ref 70–99)
Glucose-Capillary: 191 mg/dL — ABNORMAL HIGH (ref 70–99)

## 2019-09-20 LAB — TROPONIN I (HIGH SENSITIVITY): Troponin I (High Sensitivity): 5 ng/L (ref <18)

## 2019-09-20 LAB — SARS CORONAVIRUS 2 BY RT PCR (HOSPITAL ORDER, PERFORMED IN ~~LOC~~ HOSPITAL LAB): SARS Coronavirus 2: NEGATIVE

## 2019-09-20 LAB — BRAIN NATRIURETIC PEPTIDE: B Natriuretic Peptide: 34.8 pg/mL (ref 0.0–100.0)

## 2019-09-20 MED ORDER — NITROGLYCERIN 2 % TD OINT
1.0000 [in_us] | TOPICAL_OINTMENT | Freq: Once | TRANSDERMAL | Status: AC
  Administered 2019-09-20: 1 [in_us] via TOPICAL
  Filled 2019-09-20: qty 1

## 2019-09-20 MED ORDER — ACETAMINOPHEN 325 MG PO TABS: 650.0000 mg | ORAL_TABLET | Freq: Four times a day (QID) | ORAL | Status: DC | PRN

## 2019-09-20 MED ORDER — INSULIN ASPART 100 UNIT/ML ~~LOC~~ SOLN
0.0000 [IU] | Freq: Every day | SUBCUTANEOUS | Status: DC
  Administered 2019-09-25: 2 [IU] via SUBCUTANEOUS
  Filled 2019-09-20: qty 1

## 2019-09-20 MED ORDER — ONDANSETRON HCL 4 MG PO TABS: 4.0000 mg | ORAL_TABLET | Freq: Four times a day (QID) | ORAL | Status: DC | PRN

## 2019-09-20 MED ORDER — ACETAMINOPHEN 650 MG RE SUPP: 650.0000 mg | Freq: Four times a day (QID) | RECTAL | Status: DC | PRN

## 2019-09-20 MED ORDER — ASPIRIN EC 81 MG PO TBEC
81.0000 mg | DELAYED_RELEASE_TABLET | Freq: Every day | ORAL | Status: DC
  Administered 2019-09-20 – 2019-09-22 (×3): 81 mg via ORAL
  Filled 2019-09-20 (×3): qty 1

## 2019-09-20 MED ORDER — HYDRALAZINE HCL 50 MG PO TABS
25.0000 mg | ORAL_TABLET | Freq: Three times a day (TID) | ORAL | Status: DC | PRN
  Administered 2019-09-20: 25 mg via ORAL
  Filled 2019-09-20: qty 1

## 2019-09-20 MED ORDER — ENOXAPARIN SODIUM 40 MG/0.4ML ~~LOC~~ SOLN
40.0000 mg | SUBCUTANEOUS | Status: DC
  Administered 2019-09-20 – 2019-09-26 (×7): 40 mg via SUBCUTANEOUS
  Filled 2019-09-20 (×8): qty 0.4

## 2019-09-20 MED ORDER — POLYETHYLENE GLYCOL 3350 17 G PO PACK: 17.0000 g | PACK | Freq: Every day | ORAL | Status: DC | PRN

## 2019-09-20 MED ORDER — LISINOPRIL 10 MG PO TABS
10.0000 mg | ORAL_TABLET | Freq: Every day | ORAL | Status: DC
  Administered 2019-09-20 – 2019-09-21 (×2): 10 mg via ORAL
  Filled 2019-09-20 (×2): qty 1

## 2019-09-20 MED ORDER — INSULIN ASPART 100 UNIT/ML ~~LOC~~ SOLN
0.0000 [IU] | Freq: Three times a day (TID) | SUBCUTANEOUS | Status: DC
  Administered 2019-09-20 (×2): 2 [IU] via SUBCUTANEOUS
  Administered 2019-09-20: 1 [IU] via SUBCUTANEOUS
  Administered 2019-09-21: 2 [IU] via SUBCUTANEOUS
  Administered 2019-09-21 (×2): 1 [IU] via SUBCUTANEOUS
  Administered 2019-09-22 (×2): 2 [IU] via SUBCUTANEOUS
  Administered 2019-09-22: 13:00:00 3 [IU] via SUBCUTANEOUS
  Administered 2019-09-23 (×2): 2 [IU] via SUBCUTANEOUS
  Administered 2019-09-23: 1 [IU] via SUBCUTANEOUS
  Administered 2019-09-24: 13:00:00 5 [IU] via SUBCUTANEOUS
  Administered 2019-09-24: 10:00:00 2 [IU] via SUBCUTANEOUS
  Administered 2019-09-24 – 2019-09-25 (×3): 3 [IU] via SUBCUTANEOUS
  Administered 2019-09-25: 2 [IU] via SUBCUTANEOUS
  Administered 2019-09-26: 13:00:00 3 [IU] via SUBCUTANEOUS
  Administered 2019-09-26: 09:00:00 2 [IU] via SUBCUTANEOUS
  Administered 2019-09-26: 9 [IU] via SUBCUTANEOUS
  Administered 2019-09-26: 16:00:00 2 [IU] via SUBCUTANEOUS
  Administered 2019-09-27: 5 [IU] via SUBCUTANEOUS
  Filled 2019-09-20 (×22): qty 1

## 2019-09-20 MED ORDER — SODIUM CHLORIDE 0.9 % IV SOLN
1.0000 g | Freq: Once | INTRAVENOUS | Status: AC
  Administered 2019-09-20: 1 g via INTRAVENOUS
  Filled 2019-09-20: qty 10

## 2019-09-20 MED ORDER — SODIUM CHLORIDE 0.9 % IV SOLN
1.0000 g | INTRAVENOUS | Status: DC
  Administered 2019-09-21 – 2019-09-23 (×3): 1 g via INTRAVENOUS
  Filled 2019-09-20 (×2): qty 1
  Filled 2019-09-20: qty 10

## 2019-09-20 MED ORDER — ALBUTEROL SULFATE (2.5 MG/3ML) 0.083% IN NEBU
2.5000 mg | INHALATION_SOLUTION | RESPIRATORY_TRACT | Status: DC | PRN
  Filled 2019-09-20: qty 3

## 2019-09-20 MED ORDER — SODIUM CHLORIDE 0.9 % IV SOLN
1.0000 g | Freq: Once | INTRAVENOUS | Status: DC
  Filled 2019-09-20: qty 10

## 2019-09-20 MED ORDER — SIMVASTATIN 20 MG PO TABS
40.0000 mg | ORAL_TABLET | Freq: Every day | ORAL | Status: DC
  Administered 2019-09-20 – 2019-09-22 (×3): 40 mg via ORAL
  Filled 2019-09-20 (×3): qty 2

## 2019-09-20 MED ORDER — ONDANSETRON HCL 4 MG/2ML IJ SOLN: 4.0000 mg | Freq: Four times a day (QID) | INTRAMUSCULAR | Status: DC | PRN

## 2019-09-20 MED ORDER — HYDROCODONE-ACETAMINOPHEN 5-325 MG PO TABS
1.0000 | ORAL_TABLET | Freq: Four times a day (QID) | ORAL | Status: DC | PRN
  Administered 2019-09-21 – 2019-09-23 (×3): 1 via ORAL
  Filled 2019-09-20 (×3): qty 1

## 2019-09-20 NOTE — H&P (Signed)
History and Physical  [name] EAV:[id number] DOB: 1933-05-08 DOA: 09/20/2019   Patient coming from: Home & is able to ambulate  Chief Complaint: Dizziness and increased urinary frequency  HPI: [name] is a 86 y.o. female with medical history significant for hypertension, hyperlipidemia, diverticulitis, diabetes mellitus presents to the ED with acute metabolic encephalopathy, unsteady gait, frequent urination and right thigh discomfort, which has been ongoing for the past few days.  History gotten from daughter, chart review as patient was confused/not oriented this a.m.  Daughter reports that patient has had a poor appetite, reports " talking crazy" for the past few days.  Noted dizziness/unsteady gait with thigh discomfort as well.  Due to these reasons, daughters brought patient to the ED for further evaluation.  Of note, patient lives by herself, and has been independent of her ADLs, cooks, drives by herself and ambulates with a walker.   ED Course: Patient's vital signs stable except for hypertensive crisis, labs largely unremarkable except for UA which is positive for UTI, UC pending, COVID-19 test negative, CT head negative for any acute intracranial abnormality, age-related cerebral atrophy, ventriculomegaly and periventricular white matter disease.  Chest x-ray pending.  Patient admitted for further management.   Review of Systems: Review of systems are otherwise negative   Past Medical History:  Diagnosis Date  . Carpal tunnel syndrome on both sides   . Diabetes (HCC)   . Diabetic polyneuropathy (HCC)   . Diverticulitis   . Hyperlipidemia   . Hypertension   . Osteoarthritis   . Vaginal prolapse    Past Surgical History:  Procedure Laterality Date  . CESAREAN SECTION    . pessary    . REPLACEMENT TOTAL KNEE BILATERAL    . VENTRAL HERNIA REPAIR    . VENTRAL HERNIA REPAIR N/A 01/13/2016   Procedure: HERNIA REPAIR VENTRAL ADULT WITH SMALL BOWEL RESECTION,  LYSIS OF ADHESIONS;  Surgeon: Gladis Riffle, MD;  Location: [facility] ORS;  Service: General;  Laterality: N/A;    Social History:  reports that she has never smoked. She has never used smokeless tobacco. She reports that she does not drink alcohol and does not use drugs.   No Known Allergies  Family History  Problem Relation Age of Onset  . Breast cancer Mother 11  . Breast cancer Maternal Aunt       Prior to Admission medications   Medication Sig Start Date End Date Taking? Authorizing Provider  aspirin EC 81 MG tablet Take 81 mg by mouth daily.   Yes [provider]  glimepiride (AMARYL) 4 MG tablet Take 4 mg by mouth daily with breakfast.   Yes [provider]  hydrochlorothiazide (HYDRODIURIL) 12.5 MG tablet Take 12.5 mg by mouth daily.   Yes [provider]  lisinopril (ZESTRIL) 20 MG tablet Take by mouth. 10/08/18 10/08/19 Yes [provider]  metFORMIN (GLUCOPHAGE) 500 MG tablet Take 500 mg by mouth 2 (two) times daily with a meal.   Yes [provider]  simvastatin (ZOCOR) 40 MG tablet Take 40 mg by mouth at bedtime.  10/16/15  Yes [provider]  acetaminophen (TYLENOL) 325 MG tablet Take 2 tablets (650 mg total) by mouth every 6 (six) hours as needed for mild pain, moderate pain, fever or headache. 01/25/16   Loflin, Laney Potash, MD  clotrimazole-betamethasone (LOTRISONE) cream Apply 1 application topically 2 (two) times daily. 08/08/19   Nadara Mustard, MD  naproxen sodium (ALEVE) 220 MG tablet Take 220-440 mg  by mouth 2 (two) times daily as needed (pain).    [provider]  sitaGLIPtin (JANUVIA) 100 MG tablet Take 100 mg by mouth daily.    [provider]    Physical Exam: BP (!) 141/64   Pulse (!) 58   Temp 98.1 F (36.7 C) (Oral)   Resp 16   Ht 5' (1.524 m)   Wt 76.2 kg   SpO2 97%   BMI 32.81 kg/m   General: NAD, alert, lethargic, not oriented Eyes: Normal ENT: Normal Neck:  Supple Cardiovascular: S1, S2 present Respiratory: Diminished breath sounds bilaterally Abdomen: Soft, nontender, nondistended, bowel sounds present Skin: Normal Musculoskeletal: 2+ bilateral pedal edema Psychiatric: Unable to assess Neurologic: No obvious focal neurologic deficits noted          Labs on Admission:  Basic Metabolic Panel: Recent Labs  Lab 09/19/19 2029  NA 139  K 4.4  CL 103  CO2 28  GLUCOSE 234*  BUN 15  CREATININE 0.75  CALCIUM 9.3   Liver Function Tests: No results for input(s): AST, ALT, ALKPHOS, BILITOT, PROT, ALBUMIN in the last 168 hours. No results for input(s): LIPASE, AMYLASE in the last 168 hours. No results for input(s): AMMONIA in the last 168 hours. CBC: Recent Labs  Lab 09/19/19 2029  WBC 10.1  HGB 12.2  HCT 38.1  MCV 97.7  PLT 242   Cardiac Enzymes: No results for input(s): CKTOTAL, CKMB, CKMBINDEX, TROPONINI in the last 168 hours.  BNP (last 3 results) Recent Labs    09/20/19 0530  BNP 34.8    ProBNP (last 3 results) No results for input(s): PROBNP in the last 8760 hours.  CBG: Recent Labs  Lab 09/19/19 2034 09/20/19 0956 09/20/19 1301  GLUCAP 192* 195* 196*    Radiological Exams on Admission: CT Head Wo Contrast  Result Date: 09/20/2019 CLINICAL DATA:  Dizziness since yesterday. EXAM: CT HEAD WITHOUT CONTRAST TECHNIQUE: Contiguous axial images were obtained from the base of the skull through the vertex without intravenous contrast. COMPARISON:  11/29/2010 FINDINGS: Brain: Age related cerebral atrophy, ventriculomegaly and periventricular white matter disease. Remote left basal ganglia infarct. No extra-axial fluid collections are identified. No CT findings for acute hemispheric infarction or intracranial hemorrhage. No mass lesions. The brainstem and cerebellum are normal. Vascular: Moderate vascular calcifications. No aneurysm or hyperdense vessels. Skull: No skull fracture or bone lesions. Sinuses/Orbits: The  paranasal sinuses and mastoid air cells are clear. The globes are intact. Stable radiopaque foreign body (bb) in the subcutaneous tissues near the superior orbital rim on the right. Other: No scalp lesions or hematoma. IMPRESSION: 1. Age related cerebral atrophy, ventriculomegaly and periventricular white matter disease. 2. Remote left basal ganglia infarct. 3. No acute intracranial findings or mass lesions. Electronically Signed   By: Rudie Meyer M.D.   On: 09/20/2019 06:33   US Venous Img Lower Unilateral Right  Result Date: 09/20/2019 CLINICAL DATA:  Pain and swelling in the right lower extremity for 1 day. EXAM: Right LOWER EXTREMITY VENOUS DOPPLER ULTRASOUND TECHNIQUE: Gray-scale sonography with compression, as well as color and duplex ultrasound, were performed to evaluate the deep venous system(s) from the level of the common femoral vein through the popliteal and proximal calf veins. COMPARISON:  None. FINDINGS: VENOUS Normal compressibility of the common femoral, superficial femoral, and popliteal veins, as well as the visualized calf veins. Visualized portions of profunda femoral vein and great saphenous vein unremarkable. No filling defects to suggest DVT on grayscale or color Doppler imaging. Doppler  waveforms show normal direction of venous flow, normal respiratory plasticity and response to augmentation. Limited views of the contralateral common femoral vein are unremarkable. OTHER None. Limitations: none IMPRESSION: Negative for DVT. Electronically Signed   By: Marin Roberts M.D.   On: 09/20/2019 06:24    EKG: Independently reviewed.  Normal sinus rhythm  Assessment/Plan Present on Admission: . UTI (urinary tract infection) . Type 2 diabetes mellitus with polyneuropathy (HCC) . Mixed hyperlipidemia . Essential hypertension  Principal Problem:   UTI (urinary tract infection) Active Problems:   Type 2 diabetes mellitus with polyneuropathy (HCC)   Mixed hyperlipidemia    Essential hypertension   Acute metabolic encephalopathy Likely 2/2 UTI Currently afebrile, with no leukocytosis Urine showed trace leukocytes, positive nitrites, many bacteria, WBC 21-50 UC pending CT head negative for any acute intracranial abnormality, age-related cerebral atrophy, ventriculomegaly and periventricular white matter disease Continue IV ceftriaxone Monitor closely  Hypertensive crisis Uncontrolled hypertension ?Evaluate for diastolic HF-BLE 2+ edema SBP as high as 210 on admission, s/p Nitropaste Chest x-ray pending Echo pending RLE Doppler negative for DVT as patient was complaining of tenderness Continue home lisinopril, may add on to HCTZ (held due to dizziness), hydralazine as needed Monitor closely  Dizziness/unsteady gait CT head as above PT/OT  Diabetes mellitus type 2 A1c pending SSI, Accu-Cheks, hypoglycemic protocol  Hyperlipidemia Continue statins     DVT prophylaxis: Lovenox  Code Status: Full  Family Communication: Discussed with daughter on 09/20/2019  Disposition Plan: Likely home  Consults called: None  Admission status: Observation    Briant Cedar MD [facility]  If 7PM-7AM, please contact night-coverage [email or web address]  09/20/2019, 3:05 PM

## 2019-09-20 NOTE — Evaluation (Signed)
Physical Therapy Evaluation Patient Details Name: [name] MRN: [id number] DOB: 07-25-1933 Today's Date: 09/20/2019   History of Present Illness  Pt admitted to ED for UTI. Pt currently A&O at this time.   Clinical Impression  Pt is a pleasant 86 year old female who was admitted for UTI. Pt performs bed mobility with min assist, transfers with supervision, and ambulation with cga and RW. Pt demonstrates deficits with strength/mobility. Educated on use of RW at all time due to increased weakness present in addition to request for more assistance at home when OOB. Pt is high falls risk. Would benefit from skilled PT to address above deficits and promote optimal return to PLOF. Recommend transition to HHPT upon discharge from acute hospitalization.     Follow Up Recommendations Home health PT;Supervision for mobility/OOB    Equipment Recommendations  None recommended by PT    Recommendations for Other Services       Precautions / Restrictions Precautions Precautions: Fall Restrictions Weight Bearing Restrictions: No      Mobility  Bed Mobility Overal bed mobility: Needs Assistance Bed Mobility: Supine to Sit     Supine to sit: Min assist     General bed mobility comments: reaches for therapist hand for trunkal advancement. Once seated at EOB, able to sit with upright posture  Transfers Overall transfer level: Needs assistance Equipment used: Rolling walker (2 wheeled) Transfers: Sit to/from Stand Sit to Stand: Supervision         General transfer comment: safe technique with upright posture  Ambulation/Gait Ambulation/Gait assistance: Min guard Gait Distance (Feet): 20 Feet Assistive device: Rolling walker (2 wheeled) Gait Pattern/deviations: Step-through pattern     General Gait Details: ambulated around room, limited secondary to lines/leads. Slight unsteadiness, needs hands on assist to maintain safety  Stairs            Wheelchair Mobility     Modified Rankin (Stroke Patients Only)       Balance Overall balance assessment: Needs assistance Sitting-balance support: Feet supported Sitting balance-Leahy Scale: Good     Standing balance support: Bilateral upper extremity supported Standing balance-Leahy Scale: Fair                               Pertinent Vitals/Pain Pain Assessment: No/denies pain    Home Living Family/patient expects to be discharged to:: Private residence Living Arrangements: Alone Available Help at Discharge: Family;Available PRN/intermittently (daughter & son available to check in) Type of Home: House Home Access: Stairs to enter Entrance Stairs-Rails: None Entrance Stairs-Number of Steps: 1 Home Layout: One level Home Equipment: Environmental consultant - 2 wheels;Cane - single point;Shower seat      Prior Function Level of Independence: Independent with assistive device(s)         Comments: indep with AD for all mobility, uses RW or SPC. Reports no recent falls     Hand Dominance        Extremity/Trunk Assessment   Upper Extremity Assessment Upper Extremity Assessment: Generalized weakness (B shoulder 3/5; 4/5 bicep/triceps)    Lower Extremity Assessment Lower Extremity Assessment: Generalized weakness (B LE grossly 4/5)       Communication   Communication: No difficulties  Cognition Arousal/Alertness: Awake/alert Behavior During Therapy: WFL for tasks assessed/performed Overall Cognitive Status: Within Functional Limits for tasks assessed  General Comments      Exercises     Assessment/Plan    PT Assessment Patient needs continued PT services  PT Problem List Decreased strength;Decreased balance;Decreased mobility       PT Treatment Interventions Gait training;Therapeutic exercise;Balance training    PT Goals (Current goals can be found in the Care Plan section)  Acute Rehab PT Goals Patient Stated Goal: to  go home PT Goal Formulation: With patient Time For Goal Achievement: 10/04/19 Potential to Achieve Goals: Good    Frequency Min 2X/week   Barriers to discharge        Co-evaluation               AM-PAC PT "6 Clicks" Mobility  Outcome Measure Help needed turning from your back to your side while in a flat bed without using bedrails?: A Little Help needed moving from lying on your back to sitting on the side of a flat bed without using bedrails?: A Little Help needed moving to and from a bed to a chair (including a wheelchair)?: A Little Help needed standing up from a chair using your arms (e.g., wheelchair or bedside chair)?: A Little Help needed to walk in hospital room?: A Little Help needed climbing 3-5 steps with a railing? : A Little 6 Click Score: 18    End of Session Equipment Utilized During Treatment: Gait belt Activity Tolerance: Patient tolerated treatment well Patient left: in bed Nurse Communication: Mobility status PT Visit Diagnosis: Unsteadiness on feet (R26.81);Muscle weakness (generalized) (M62.81);Difficulty in walking, not elsewhere classified (R26.2);History of falling (Z91.81)    Time: 1433-1500 PT Time Calculation (min) (ACUTE ONLY): 27 min   Charges:   PT Evaluation $PT Eval Moderate Complexity: 1 [address] PT, DPT [phone]   Raetta Agostinelli 09/20/2019, 4:05 PM

## 2019-09-20 NOTE — ED Notes (Signed)
US at bedside

## 2019-09-20 NOTE — ED Notes (Signed)
Notified md that pt BP remains high

## 2019-09-20 NOTE — ED Notes (Signed)
Patient was only able to eat her fruit cup for breakfast and

## 2019-09-20 NOTE — ED Notes (Signed)
Report to [place]

## 2019-09-20 NOTE — ED Notes (Signed)
Patient urinated on floor, patient was able to Doctor, hospital with helping her clean herself. Patient was able to stand near bed and hold onto the bed for assistance while writer wiped her off  Writer took patient shirt and socks off due to them being wet, placed them in a belongings bag and gave her a clean gown

## 2019-09-20 NOTE — ED Notes (Addendum)
Patient daughter Shaune Pascal) called and asked for an update on patient

## 2019-09-20 NOTE — ED Provider Notes (Signed)
[facility] Emergency Department Provider Note  ____________________________________________   First MD Initiated Contact with Patient 09/20/19 [phone]     (approximate)  I have reviewed the triage vital signs and the nursing notes.   HISTORY  Chief Complaint Dizziness    HPI [name] is a 86 y.o. female with below list of previous medical conditions including hypertension hyperlipidemia and diverticulitis presents to the emergency department secondary to 1 day history of dizziness and increasing urinary frequency.  Patient also admits to right thigh discomfort.  Patient denies any injury.  Patient denies any apparent swelling to the thigh.  Patient denies any chest pain no difficulty breathing.  Patient also did denies any dysuria.  Patient denies any nausea or vomiting.  Patient denies any abdominal discomfort.        Past Medical History:  Diagnosis Date  . Carpal tunnel syndrome on both sides   . Diabetes (HCC)   . Diabetic polyneuropathy (HCC)   . Diverticulitis   . Hyperlipidemia   . Hypertension   . Osteoarthritis   . Vaginal prolapse     Patient Active Problem List   Diagnosis Date Noted  . UTI (urinary tract infection) 09/20/2019  . Sepsis (HCC) 03/22/2018  . Cystocele, midline 09/04/2017  . Incomplete uterine prolapse 09/04/2017  . Malnutrition of moderate degree 01/15/2016  . Dehydration   . Incarcerated ventral hernia   . Diabetic ketoacidosis without coma associated with type 2 diabetes mellitus (HCC)   . Type 2 diabetes mellitus with polyneuropathy (HCC) 08/06/2015  . Carpal tunnel syndrome, bilateral 07/10/2015  . Osteopenia of multiple sites 07/10/2015  . Arthralgia of multiple sites 06/27/2015  . Mixed hyperlipidemia 11/06/2014  . Hypomagnesemia 11/02/2013  . Essential hypertension 11/02/2013    Past Surgical History:  Procedure Laterality Date  . CESAREAN SECTION    . pessary    . REPLACEMENT TOTAL KNEE  BILATERAL    . VENTRAL HERNIA REPAIR    . VENTRAL HERNIA REPAIR N/A 01/13/2016   Procedure: HERNIA REPAIR VENTRAL ADULT WITH SMALL BOWEL RESECTION, LYSIS OF ADHESIONS;  Surgeon: Gladis Riffle, MD;  Location: [facility] ORS;  Service: General;  Laterality: N/A;    Prior to Admission medications   Medication Sig Start Date End Date Taking? Authorizing Provider  acetaminophen (TYLENOL) 325 MG tablet Take 2 tablets (650 mg total) by mouth every 6 (six) hours as needed for mild pain, moderate pain, fever or headache. 01/25/16   Gladis Riffle, MD  aspirin EC 81 MG tablet Take 81 mg by mouth daily.    [provider]  clotrimazole-betamethasone (LOTRISONE) cream Apply 1 application topically 2 (two) times daily. 08/08/19   Nadara Mustard, MD  glimepiride (AMARYL) 2 MG tablet Take 1 mg by mouth daily.  04/27/15   [provider]  lisinopril-hydrochlorothiazide (PRINZIDE,ZESTORETIC) 10-12.5 MG tablet Take 1 tablet by mouth daily.  07/27/17 07/27/18  [provider]  metFORMIN (GLUCOPHAGE) 500 MG tablet Take 500 mg by mouth 2 (two) times daily with a meal.    [provider]  naproxen sodium (ALEVE) 220 MG tablet Take 220-440 mg by mouth 2 (two) times daily as needed (pain).    [provider]  simvastatin (ZOCOR) 40 MG tablet Take 40 mg by mouth at bedtime.  10/16/15   [provider]  sitaGLIPtin (JANUVIA) 100 MG tablet Take 100 mg by mouth daily.    [provider]    Allergies Patient has no known allergies.  Family History  Problem Relation Age of Onset  . Breast cancer Mother 8  . Breast cancer Maternal Aunt     Social History Social History   Tobacco Use  . Smoking status: Never Smoker  . Smokeless tobacco: Never Used  Vaping Use  . Vaping Use: Never used  Substance Use Topics  . Alcohol use: No  . Drug use: No    Review of Systems Constitutional: No fever/chills Eyes: No visual changes. ENT: No sore  throat. Cardiovascular: Denies chest pain. Respiratory: Denies shortness of breath. Gastrointestinal: No abdominal pain.  No nausea, no vomiting.  No diarrhea.  No constipation. Genitourinary: Negative for dysuria. Musculoskeletal: Negative for neck pain.  Negative for back pain. Integumentary: Negative for rash. Neurological: Negative for headaches, focal weakness or numbness.  Positive for dizziness   ____________________________________________   PHYSICAL EXAM:  VITAL SIGNS: ED Triage Vitals  Enc Vitals Group     BP 09/19/19 2023 (!) 184/79     Pulse Rate 09/19/19 2023 67     Resp 09/19/19 2023 16     Temp 09/19/19 2023 98.4 F (36.9 C)     Temp Source 09/19/19 2023 Oral     SpO2 09/19/19 2023 99 %     Weight 09/19/19 2024 76.2 kg (167 lb 15.9 oz)     Height 09/19/19 2024 1.524 m (5')     Head Circumference --      Peak Flow --      Pain Score 09/19/19 2024 0     Pain Loc --      Pain Edu? --      Excl. in GC? --     Constitutional: Alert and oriented.  Eyes: Conjunctivae are normal.  Head: Atraumatic. Mouth/Throat: Patient is wearing a mask. Neck: No stridor.  No meningeal signs.   Cardiovascular: Normal rate, regular rhythm. Good peripheral circulation. Grossly normal heart sounds. Respiratory: Normal respiratory effort.  No retractions. Gastrointestinal: Soft and nontender. No distention.  Musculoskeletal: No lower extremity tenderness.  2+ lower extremity pitting edema Neurologic:  Normal speech and language. No gross focal neurologic deficits are appreciated.  Skin:  Skin is warm, dry and intact. Psychiatric: Mood and affect are normal. Speech and behavior are normal.  ____________________________________________   LABS (all labs ordered are listed, but only abnormal results are displayed)  Labs Reviewed  BASIC METABOLIC PANEL - Abnormal; Notable for the following components:      Result Value   Glucose, Bld 234 (*)    All other components within  normal limits  URINALYSIS, COMPLETE (UACMP) WITH MICROSCOPIC - Abnormal; Notable for the following components:   Color, Urine YELLOW (*)    APPearance CLOUDY (*)    Glucose, UA >=500 (*)    Hgb urine dipstick SMALL (*)    Protein, ur 100 (*)    Nitrite POSITIVE (*)    Leukocytes,Ua TRACE (*)    Bacteria, UA MANY (*)    All other components within normal limits  GLUCOSE, CAPILLARY - Abnormal; Notable for the following components:   Glucose-Capillary 192 (*)    All other components within normal limits  URINE CULTURE  CBC  BRAIN NATRIURETIC PEPTIDE  CBG MONITORING, ED  TROPONIN I (HIGH SENSITIVITY)   ____________________________________________  EKG  ED ECG REPORT I, Deputy N Machi Whittaker, the attending physician, personally viewed and interpreted this ECG.   Date: 09/20/2019  EKG Time: 8:26 PM  Rate: 64  Rhythm: Normal sinus rhythm  Axis: Normal  Intervals: Normal  ST&T Change: None  ____________________________________________  RADIOLOGY I, [place] N Dandre Sisler, personally viewed and evaluated these images (plain radiographs) as part of my medical decision making, as well as reviewing the written report by the radiologist.  ED MD interpretation: CT head scan revealed remote left basal ganglia infarct.  Right lower extremity venous ultrasound revealed no evidence of DVT.     Official radiology report(s): CT Head Wo Contrast  Result Date: 09/20/2019 CLINICAL DATA:  Dizziness since yesterday. EXAM: CT HEAD WITHOUT CONTRAST TECHNIQUE: Contiguous axial images were obtained from the base of the skull through the vertex without intravenous contrast. COMPARISON:  11/29/2010 FINDINGS: Brain: Age related cerebral atrophy, ventriculomegaly and periventricular white matter disease. Remote left basal ganglia infarct. No extra-axial fluid collections are identified. No CT findings for acute hemispheric infarction or intracranial hemorrhage. No mass lesions. The brainstem and cerebellum are  normal. Vascular: Moderate vascular calcifications. No aneurysm or hyperdense vessels. Skull: No skull fracture or bone lesions. Sinuses/Orbits: The paranasal sinuses and mastoid air cells are clear. The globes are intact. Stable radiopaque foreign body (bb) in the subcutaneous tissues near the superior orbital rim on the right. Other: No scalp lesions or hematoma. IMPRESSION: 1. Age related cerebral atrophy, ventriculomegaly and periventricular white matter disease. 2. Remote left basal ganglia infarct. 3. No acute intracranial findings or mass lesions. Electronically Signed   By: Rudie Meyer M.D.   On: 09/20/2019 06:33   US Venous Img Lower Unilateral Right  Result Date: 09/20/2019 CLINICAL DATA:  Pain and swelling in the right lower extremity for 1 day. EXAM: Right LOWER EXTREMITY VENOUS DOPPLER ULTRASOUND TECHNIQUE: Gray-scale sonography with compression, as well as color and duplex ultrasound, were performed to evaluate the deep venous system(s) from the level of the common femoral vein through the popliteal and proximal calf veins. COMPARISON:  None. FINDINGS: VENOUS Normal compressibility of the common femoral, superficial femoral, and popliteal veins, as well as the visualized calf veins. Visualized portions of profunda femoral vein and great saphenous vein unremarkable. No filling defects to suggest DVT on grayscale or color Doppler imaging. Doppler waveforms show normal direction of venous flow, normal respiratory plasticity and response to augmentation. Limited views of the contralateral common femoral vein are unremarkable. OTHER None. Limitations: none IMPRESSION: Negative for DVT. Electronically Signed   By: Marin Roberts M.D.   On: 09/20/2019 06:24       Procedures   ____________________________________________   INITIAL IMPRESSION / MDM / ASSESSMENT AND PLAN / ED COURSE  As part of my medical decision making, I reviewed the following data within the electronic medical  record:   86 year old female presenting with above-stated history and physical exam differential diagnosis including but not limited to urinary tract infection, musculoskeletal etiology for thigh pain, DVT, also concern for peripheral edema, CHF.  Patient noted to have a urinary tract infection for which patient was given ceftriaxone 1 g IV.  Patient also noted to be markedly hypertensive with blood pressure of 219/85 as such 1 inch Nitropaste applied to the patient.  Patient subsequently discussed with hospitalist after hospital admission further evaluation and management  ____________________________________________  FINAL CLINICAL IMPRESSION(S) / ED DIAGNOSES  Final diagnoses:  Acute cystitis without hematuria  Hypertensive urgency     MEDICATIONS GIVEN DURING THIS VISIT:  Medications  cefTRIAXone (ROCEPHIN) 1 g in sodium chloride 0.9 % 100 mL IVPB (1 g Intravenous Not Given 09/20/19 0539)  cefTRIAXone (ROCEPHIN) 1 g in sodium chloride 0.9 % 100 mL IVPB (1 g Intravenous New  Bag/Given 09/20/19 0530)  nitroGLYCERIN (NITROGLYN) 2 % ointment 1 inch (1 inch Topical Given 09/20/19 0537)     ED Discharge Orders    None      *Please note:  Lorinda CreedHallie G Negash was evaluated in Emergency Department on 09/20/2019 for the symptoms described in the history of present illness. She was evaluated in the context of the global COVID-19 pandemic, which necessitated consideration that the patient might be at risk for infection with the SARS-CoV-2 virus that causes COVID-19. Institutional protocols and algorithms that pertain to the evaluation of patients at risk for COVID-19 are in a state of rapid change based on information released by regulatory bodies including the CDC and federal and state organizations. These policies and algorithms were followed during the patient's care in the ED.  Some ED evaluations and interventions may be delayed as a result of limited staffing during and after the  pandemic.*  Note:  This document was prepared using Dragon voice recognition software and may include unintentional dictation errors.   Darci CurrentBrown, [place] N, MD 09/20/19 [phone]

## 2019-09-21 ENCOUNTER — Observation Stay
Admit: 2019-09-21 | Discharge: 2019-09-21 | Disposition: A | Discharge status: Home / Self Care | Payer: Medicare HMO | Attending: Internal Medicine | Admitting: Internal Medicine

## 2019-09-21 DIAGNOSIS — N3001 Acute cystitis with hematuria: Secondary | ICD-10-CM | POA: Diagnosis not present

## 2019-09-21 LAB — GLUCOSE, CAPILLARY
Glucose-Capillary: 132 mg/dL — ABNORMAL HIGH (ref 70–99)
Glucose-Capillary: 170 mg/dL — ABNORMAL HIGH (ref 70–99)
Glucose-Capillary: 142 mg/dL — ABNORMAL HIGH (ref 70–99)
Glucose-Capillary: 175 mg/dL — ABNORMAL HIGH (ref 70–99)

## 2019-09-21 LAB — VITAMIN B12: Vitamin B-12: 277 pg/mL (ref 180–914)

## 2019-09-21 LAB — BASIC METABOLIC PANEL
Anion gap: 7 (ref 5–15)
BUN: 17 mg/dL (ref 8–23)
CO2: 29 mmol/L (ref 22–32)
Calcium: 8.9 mg/dL (ref 8.9–10.3)
Chloride: 106 mmol/L (ref 98–111)
Creatinine, Ser: 0.69 mg/dL (ref 0.44–1.00)
GFR calc Af Amer: >60 mL/min (ref >60)
GFR calc non Af Amer: >60 mL/min (ref >60)
Glucose, Bld: 145 mg/dL — ABNORMAL HIGH (ref 70–99)
Potassium: 4 mmol/L (ref 3.5–5.1)
Sodium: 142 mmol/L (ref 135–145)

## 2019-09-21 LAB — CBC
HCT: 35.9 % — ABNORMAL LOW (ref 36.0–46.0)
Hemoglobin: 11.4 g/dL — ABNORMAL LOW (ref 12.0–15.0)
MCH: 31.1 pg (ref 26.0–34.0)
MCHC: 31.8 g/dL (ref 30.0–36.0)
MCV: 97.8 fL (ref 80.0–100.0)
Platelets: 191 10*3/uL (ref 150–400)
RBC: 3.67 MIL/uL — ABNORMAL LOW (ref 3.87–5.11)
RDW: 12.3 % (ref 11.5–15.5)
WBC: 8.6 10*3/uL (ref 4.0–10.5)
nRBC: 0 % (ref 0.0–0.2)

## 2019-09-21 LAB — URINE CULTURE

## 2019-09-21 LAB — HEMOGLOBIN A1C
Hgb A1c MFr Bld: 8.1 % — ABNORMAL HIGH (ref 4.8–5.6)
Mean Plasma Glucose: 185.77 mg/dL

## 2019-09-21 MED ORDER — VITAMIN B-12 100 MCG PO TABS
100.0000 ug | ORAL_TABLET | Freq: Every day | ORAL | Status: DC
  Administered 2019-09-21 – 2019-09-26 (×5): 100 ug via ORAL
  Filled 2019-09-21 (×7): qty 1

## 2019-09-21 MED ORDER — HYDROCHLOROTHIAZIDE 25 MG PO TABS
25.0000 mg | ORAL_TABLET | Freq: Every day | ORAL | Status: DC
  Administered 2019-09-21 – 2019-09-22 (×2): 25 mg via ORAL
  Filled 2019-09-21 (×2): qty 1

## 2019-09-21 NOTE — TOC Initial Note (Signed)
Transition of Care [facility]) - Initial/Assessment Note    Patient Details  Name: [name] MRN: [id number] Date of Birth: 1933-03-06  [facility]) CM/SW Contact:    Allayne Butcher, RN Phone Number: 09/21/2019, 11:11 AM  Clinical Narrative:                 Patient placed in observation for urinary frequency and dizziness.  Patient is from home where she lives alone.  Patient's son, [name], is at the bedside and reports that he lives right behind her house.  Patient is independent at home, she drives and walks with a walker.  Patient is current with her PCP, Dr. Letitia Libra, she gets her prescriptions from [facility].   Patient agrees to home health services and has no preference in agency.  Mellissa Kohut at [facility] has accepted referral for home health PT and OT.    Expected Discharge Plan: Home w Home Health Services Barriers to Discharge: Continued Medical Work up   Patient Goals and CMS Choice Patient states their goals for this hospitalization and ongoing recovery are:: to be able to walk good   Choice offered to / list presented to : Patient  Expected Discharge Plan and Services Expected Discharge Plan: Home w Home Health Services   Discharge Planning Services: CM Consult Post Acute Care Choice: Home Health Living arrangements for the past 2 months: Single Family Home                           HH Arranged: PT, OT HH Agency: [facility] (formerly [facility]) Date HH Agency Contacted: 09/21/19 Time HH Agency Contacted: 1050 Representative spoke with at [facility] Agency: Mellissa Kohut  Prior Living Arrangements/Services Living arrangements for the past 2 months: Single Family Home Lives with:: Self Patient language and need for interpreter reviewed:: Yes Do you feel safe going back to the place where you live?: Yes      Need for Family Participation in Patient Care: Yes (Comment) (UTI) Care giver support system in place?: Yes (comment) (son and  daughter) Current home services: DME (cane and walker) Criminal Activity/Legal Involvement Pertinent to Current Situation/Hospitalization: No - Comment as needed  Activities of Daily Living Home Assistive Devices/Equipment: Cane (specify quad or straight), Walker (specify type), CBG Meter, Eyeglasses ADL Screening (condition at time of admission) Patient's cognitive ability adequate to safely complete daily activities?: Yes Is the patient deaf or have difficulty hearing?: No Does the patient have difficulty seeing, even when wearing glasses/contacts?: No Does the patient have difficulty concentrating, remembering, or making decisions?: No Patient able to express need for assistance with ADLs?: Yes Does the patient have difficulty dressing or bathing?: No Independently performs ADLs?: Yes (appropriate for developmental age) Does the patient have difficulty walking or climbing stairs?: Yes Weakness of Legs: Right Weakness of Arms/Hands: None  Permission Sought/Granted Permission sought to share information with : Case Manager, Family Supports, Other (comment) Permission granted to share information with : Yes, Verbal Permission Granted  Share Information with NAME: [name]  Permission granted to share info w AGENCY: [facility]  Permission granted to share info w Relationship: son     Emotional Assessment Appearance:: Appears stated age Attitude/Demeanor/Rapport: Engaged Affect (typically observed): Accepting Orientation: : Oriented to Self, Oriented to Place, Oriented to  Time, Oriented to Situation Alcohol / Substance Use: Not Applicable Psych Involvement: No (comment)  Admission diagnosis:  UTI (urinary tract infection) [N39.0] Dyspnea [R06.00] Hypertensive  urgency [I16.0] Acute cystitis without hematuria [N30.00] Patient Active Problem List   Diagnosis Date Noted  . UTI (urinary tract infection) 09/20/2019  . Sepsis (HCC) 03/22/2018  . Cystocele, midline 09/04/2017  .  Incomplete uterine prolapse 09/04/2017  . Malnutrition of moderate degree 01/15/2016  . Dehydration   . Incarcerated ventral hernia   . Diabetic ketoacidosis without coma associated with type 2 diabetes mellitus (HCC)   . Type 2 diabetes mellitus with polyneuropathy (HCC) 08/06/2015  . Carpal tunnel syndrome, bilateral 07/10/2015  . Osteopenia of multiple sites 07/10/2015  . Arthralgia of multiple sites 06/27/2015  . Mixed hyperlipidemia 11/06/2014  . Hypomagnesemia 11/02/2013  . Essential hypertension 11/02/2013   PCP:  Leotis Shames, MD Pharmacy:   [facility] - [place], [place] - [address] [place] [address] Phone: [phone] Fax: [phone]     Social Determinants of Health (SDOH) Interventions    Readmission Risk Interventions No flowsheet data found.

## 2019-09-21 NOTE — Progress Notes (Signed)
PROGRESS NOTE    [name]  HER:[id number] DOB: 1933-07-29 DOA: 09/20/2019 PCP: Gracelyn Nurse, MD   Brief Narrative: 86 year old past medical history significant for hypertension, hyperlipidemia, diverticulitis, diabetes who presents with acute metabolic encephalopathy, unsteady gait, frequent urination and right thigh discomfort for the last few days.  Patient presented with hypertensive crisis, UA positive for UTI, Kovic 19 test negative.  CT head negative for acute intracranial abnormality.  Chest x-ray; no acute cardiopulmonary disease  Assessment & Plan:   Principal Problem:   UTI (urinary tract infection) Active Problems:   Type 2 diabetes mellitus with polyneuropathy (HCC)   Mixed hyperlipidemia   Essential hypertension   1-acute metabolic encephalopathy likely secondary to UTI Patient is alert and oriented today. UA with 21-50 white blood cells. Continue with IV ceftriaxone. CT head negative for acute abnormality. Unable to perform MRI Urine culture multiple species present  Hypertensive crisis, uncontrolled hypertension: Chest x-ray negative. Doppler lower extremity negative for DVT Continue with lisinopril, start hydrochlorothiazide Echo pending  Dizziness/unsteady gait: CT negative. Patient unable to get MRI Check B12 low normal; 227. Start supplement.  PT eval  Diabetes type 2 Continue with a sliding scale insulin  Hyperlipidemia continue with statins  Estimated body mass index is 32.81 kg/m as calculated from the following:   Height as of this encounter: 5' (1.524 m).   Weight as of this encounter: 76.2 kg.   DVT prophylaxis: Lovenox Code Status: Full code Family Communication: Disposition Plan:  Status is: Observation  The patient remains OBS appropriate and will d/c before 2 midnights.  Dispo: The patient is from: Home              Anticipated d/c is to: Home              Anticipated d/c date is: 1 day              Patient  currently is not medically stable to d/c.  To discharge tomorrow if blood pressure better controlled        Consultants:   None  Procedures:   Echo  Antimicrobials:  Ceftriaxone  Subjective: Patient is alert and conversant.  She feels less confused.  She is alert and oriented to time place and situation. She reporter mild dizziness yesterday on ambulation  Objective: Vitals:   09/20/19 2327 09/21/19 0328 09/21/19 0744 09/21/19 1238  BP: (!) 163/60 (!) 156/85 (!) 168/95 (!) 177/69  Pulse: 63 63 (!) 59 61  Resp: 16 16 18 20   Temp: 98.3 F (36.8 C) 98.3 F (36.8 C) 98 F (36.7 C) 98.1 F (36.7 C)  TempSrc:      SpO2: 98% 98% 97% 100%  Weight:      Height:        Intake/Output Summary (Last 24 hours) at 09/21/2019 1556 Last data filed at 09/21/2019 1230 Gross per 24 hour  Intake 480 ml  Output --  Net 480 ml   Filed Weights   09/19/19 2024  Weight: 76.2 kg    Examination:  General exam: Appears calm and comfortable  Respiratory system: Clear to auscultation. Respiratory effort normal. Cardiovascular system: S1 & S2 heard, RRR. No JVD, murmurs, rubs, gallops or clicks. No pedal edema. Gastrointestinal system: Abdomen is nondistended, soft and nontender. No organomegaly or masses felt. Normal bowel sounds heard. Central nervous system: Alert and oriented. No focal neurological deficits. Extremities: Symmetric 5 x 5 power. Skin: No rashes, lesions or ulcers Psychiatry: Judgement and insight appear normal.  Mood & affect appropriate.     Data Reviewed: I have personally reviewed following labs and imaging studies  CBC: Recent Labs  Lab 09/19/19 2029 09/21/19 0459  WBC 10.1 8.6  HGB 12.2 11.4*  HCT 38.1 35.9*  MCV 97.7 97.8  PLT 242 191   Basic Metabolic Panel: Recent Labs  Lab 09/19/19 2029 09/21/19 0459  NA 139 142  K 4.4 4.0  CL 103 106  CO2 28 29  GLUCOSE 234* 145*  BUN 15 17  CREATININE 0.75 0.69  CALCIUM 9.3 8.9   GFR: Estimated  Creatinine Clearance: 48.6 mL/min (by C-G formula based on SCr of 0.69 mg/dL). Liver Function Tests: No results for input(s): AST, ALT, ALKPHOS, BILITOT, PROT, ALBUMIN in the last 168 hours. No results for input(s): LIPASE, AMYLASE in the last 168 hours. No results for input(s): AMMONIA in the last 168 hours. Coagulation Profile: No results for input(s): INR, PROTIME in the last 168 hours. Cardiac Enzymes: No results for input(s): CKTOTAL, CKMB, CKMBINDEX, TROPONINI in the last 168 hours. BNP (last 3 results) No results for input(s): PROBNP in the last 8760 hours. HbA1C: Recent Labs    09/21/19 0459  HGBA1C 8.1*   CBG: Recent Labs  Lab 09/20/19 1301 09/20/19 1741 09/20/19 2113 09/21/19 0738 09/21/19 1349  GLUCAP 196* 142* 191* 132* 170*   Lipid Profile: No results for input(s): CHOL, HDL, LDLCALC, TRIG, CHOLHDL, LDLDIRECT in the last 72 hours. Thyroid Function Tests: No results for input(s): TSH, T4TOTAL, FREET4, T3FREE, THYROIDAB in the last 72 hours. Anemia Panel: Recent Labs    09/21/19 0458  VITAMINB12 277   Sepsis Labs: No results for input(s): PROCALCITON, LATICACIDVEN in the last 168 hours.  Recent Results (from the past 240 hour(s))  Urine culture     Status: Abnormal   Collection Time: 09/19/19  8:29 PM   Specimen: Urine, Random  Result Value Ref Range Status   Specimen Description   Final    URINE, RANDOM Performed at [facility], [address] [place], [place] [address]    Special Requests   Final    NONE Performed at [facility], [address] [place], [place] [address]    Culture MULTIPLE SPECIES PRESENT, SUGGEST RECOLLECTION (A)  Final   Report Status 09/21/2019 FINAL  Final  SARS Coronavirus 2 by RT PCR (hospital order, performed in [facility] hospital lab) Nasopharyngeal Nasopharyngeal Swab     Status: None   Collection Time: 09/20/19  9:04 AM   Specimen: Nasopharyngeal Swab  Result Value Ref Range Status   SARS  Coronavirus 2 NEGATIVE NEGATIVE Final    Comment: (NOTE) SARS-CoV-2 target nucleic acids are NOT DETECTED.  The SARS-CoV-2 RNA is generally detectable in upper and lower respiratory specimens during the acute phase of infection. The lowest concentration of SARS-CoV-2 viral copies this assay can detect is 250 copies / mL. A negative result does not preclude SARS-CoV-2 infection and should not be used as the sole basis for treatment or other patient management decisions.  A negative result may occur with improper specimen collection / handling, submission of specimen other than nasopharyngeal swab, presence of viral mutation(s) within the areas targeted by this assay, and inadequate number of viral copies (<250 copies / mL). A negative result must be combined with clinical observations, patient history, and epidemiological information.  Fact Sheet for Patients:   BoilerBrush.com.cy  Fact Sheet for Healthcare Providers: https://pope.com/  This test is not yet approved or  cleared by the [place] FDA and has been authorized for detection and/or diagnosis of SARS-CoV-2 by FDA under an Emergency Use Authorization (EUA).  This EUA will remain in effect (meaning this test can be used) for the duration of the COVID-19 declaration under Section 564(b)(1) of the Act, 21 U.S.C. section 360bbb-3(b)(1), unless the authorization is terminated or revoked sooner.  Performed at [facility], [address] [place], [place] [id number]          Radiology Studies: CT Head Wo Contrast  Result Date: 09/20/2019 CLINICAL DATA:  Dizziness since yesterday. EXAM: CT HEAD WITHOUT CONTRAST TECHNIQUE: Contiguous axial images were obtained from the base of the skull through the vertex without intravenous contrast. COMPARISON:  11/29/2010 FINDINGS: Brain: Age related cerebral atrophy, ventriculomegaly and periventricular white matter disease.  Remote left basal ganglia infarct. No extra-axial fluid collections are identified. No CT findings for acute hemispheric infarction or intracranial hemorrhage. No mass lesions. The brainstem and cerebellum are normal. Vascular: Moderate vascular calcifications. No aneurysm or hyperdense vessels. Skull: No skull fracture or bone lesions. Sinuses/Orbits: The paranasal sinuses and mastoid air cells are clear. The globes are intact. Stable radiopaque foreign body (bb) in the subcutaneous tissues near the superior orbital rim on the right. Other: No scalp lesions or hematoma. IMPRESSION: 1. Age related cerebral atrophy, ventriculomegaly and periventricular white matter disease. 2. Remote left basal ganglia infarct. 3. No acute intracranial findings or mass lesions. Electronically Signed   By: Rudie MeyerP.  Gallerani M.D.   On: 09/20/2019 06:33   US Venous Img Lower Unilateral Right  Result Date: 09/20/2019 CLINICAL DATA:  Pain and swelling in the right lower extremity for 1 day. EXAM: Right LOWER EXTREMITY VENOUS DOPPLER ULTRASOUND TECHNIQUE: Gray-scale sonography with compression, as well as color and duplex ultrasound, were performed to evaluate the deep venous system(s) from the level of the common femoral vein through the popliteal and proximal calf veins. COMPARISON:  None. FINDINGS: VENOUS Normal compressibility of the common femoral, superficial femoral, and popliteal veins, as well as the visualized calf veins. Visualized portions of profunda femoral vein and great saphenous vein unremarkable. No filling defects to suggest DVT on grayscale or color Doppler imaging. Doppler waveforms show normal direction of venous flow, normal respiratory plasticity and response to augmentation. Limited views of the contralateral common femoral vein are unremarkable. OTHER None. Limitations: none IMPRESSION: Negative for DVT. Electronically Signed   By: Marin Robertshristopher  Mattern M.D.   On: 09/20/2019 06:24   DG Chest Port 1 View  Result  Date: 09/20/2019 CLINICAL DATA:  Some difficulty breathing. Admitted for urinary tract infection EXAM: PORTABLE CHEST 1 VIEW COMPARISON:  03/25/2018 FINDINGS: Cardiac silhouette is normal in size. No mediastinal or hilar masses. Lungs are clear.  No convincing pleural effusion.  No pneumothorax. Skeletal structures are demineralized. Chronic bilateral shoulder arthropathic changes, stable. IMPRESSION: No acute cardiopulmonary disease. Electronically Signed   By: Amie Portlandavid  Ormond M.D.   On: 09/20/2019 15:50        Scheduled Meds: . aspirin EC  81 mg Oral Daily  . enoxaparin (LOVENOX) injection  40 mg Subcutaneous Q24H  . insulin aspart  0-5 Units Subcutaneous QHS  . insulin aspart  0-9 Units Subcutaneous TID WC  . lisinopril  10 mg Oral Daily  . simvastatin  40 mg Oral QHS   Continuous Infusions: . cefTRIAXone (ROCEPHIN)  IV 1 g (09/21/19 0525)     LOS: 0 days    Time spent:35 minutes.     Alba CoryBelkys A Giannamarie Paulus, MD [facility]  If 7PM-7AM, please contact night-coverage [email or web address]  09/21/2019, 3:56 PM

## 2019-09-21 NOTE — Evaluation (Signed)
Occupational Therapy Evaluation Patient Details Name: [name] MRN: [id number] DOB: 1933/09/23 Today's Date: 09/21/2019    History of Present Illness Pt admitted to ED for UTI. Pt currently A&O at this time.    Clinical Impression   Patient presenting with decreased I in self care, balance, functional mobility/transfers, endurance, safety awareness, and strength.  Patient reports being mod I PTA with daughter "checking in" on her intermittently. Pt reports feeling very weak this morning and therapist did wake her up for OT intervention. Patient currently functioning at mod A overall for self care tasks and functional mobility. Patient will benefit from acute OT to increase overall independence in the areas of ADLs, functional mobility, and safety awareness in order to safely discharge home with caregiver.    Follow Up Recommendations  Home health OT;Supervision/Assistance - 24 hour    Equipment Recommendations  3 in 1 bedside commode    Recommendations for Other Services Other (comment) (none at this time)     Precautions / Restrictions Precautions Precautions: Fall      Mobility Bed Mobility Overal bed mobility: Needs Assistance Bed Mobility: Supine to Sit;Sit to Supine     Supine to sit: Mod assist Sit to supine: Mod assist   General bed mobility comments: supine >sit with mod A for trunk and R LE to EOB and pt needing assist with B LEs sit >supine. Pt needing min cuing for hand placement and technique  Transfers Overall transfer level: Needs assistance Equipment used: Rolling walker (2 wheeled) Transfers: Sit to/from Stand Sit to Stand: Mod assist         General transfer comment: mod A from standard bed height with min - mod A for standing balance secondary to posterior bias    Balance Overall balance assessment: Needs assistance Sitting-balance support: Feet supported Sitting balance-Leahy Scale: Fair Sitting balance - Comments: min A Postural  control: Posterior lean   Standing balance-Leahy Scale: Poor Standing balance comment: min - mod A with reliance on UE support          ADL either performed or assessed with clinical judgement   ADL Overall ADL's : Needs assistance/impaired     Grooming: Wash/dry hands;Wash/dry face;Oral care;Sitting;Set up;Minimal assistance Grooming Details (indicate cue type and reason): min A for sitting balance secondary to pt with posterior bias         Upper Body Dressing : Total assistance;Sitting Upper Body Dressing Details (indicate cue type and reason): total A to don B socks on EOB      General ADL Comments: pt reports she has "good days and bad day" but that she overall just feels very weak this morning. OT anticipates pt would need mod A for LB self care.     Vision Baseline Vision/History: Wears glasses Wears Glasses: At all times Patient Visual Report: No change from baseline Vision Assessment?: No apparent visual deficits            Pertinent Vitals/Pain Pain Assessment: No/denies pain     Hand Dominance Right   Extremity/Trunk Assessment Upper Extremity Assessment Upper Extremity Assessment: LUE deficits/detail;RUE deficits/detail RUE Deficits / Details: AROM limited but WFLs with shoulder elevation ~ 100 degrees LUE Deficits / Details: pt reports UE painful and guarding throughout session and not wanting to use   Lower Extremity Assessment Lower Extremity Assessment: Generalized weakness   Cervical / Trunk Assessment Cervical / Trunk Assessment: Kyphotic   Communication Communication Communication: No difficulties   Cognition Arousal/Alertness: Awake/alert Behavior During Therapy: WFL for  tasks assessed/performed Overall Cognitive Status: Within Functional Limits for tasks assessed                     Home Living Family/patient expects to be discharged to:: Private residence   Available Help at Discharge: Family;Available PRN/intermittently Type  of Home: House Home Access: Stairs to enter [employer] of Steps: 1 Entrance Stairs-Rails: None Home Layout: One level     Bathroom Shower/Tub: Chief Strategy Officer: Handicapped height     Home Equipment: Environmental consultant - 2 wheels;Cane - single point;Shower seat   Additional Comments: Pt plans to stay with daughter if able at discharge (daughter works 12 hour shifts outside of home) so 24/7 not available      Prior Functioning/Environment Level of Independence: Independent with assistive device(s)        Comments: indep with AD for all mobility, uses RW or SPC. Reports no recent falls        OT Problem List: Decreased strength;Decreased activity tolerance;Decreased safety awareness;Impaired balance (sitting and/or standing)      OT Treatment/Interventions: Self-care/ADL training;Therapeutic exercise;Therapeutic activities;Energy conservation;Patient/family education;DME and/or AE instruction;Balance training    OT Goals(Current goals can be found in the care plan section) Acute Rehab OT Goals Patient Stated Goal: to go home OT Goal Formulation: With patient Time For Goal Achievement: 10/05/19 Potential to Achieve Goals: Good ADL Goals Pt Will Perform Grooming: with supervision;standing Pt Will Perform Lower Body Dressing: with supervision Pt Will Transfer to Toilet: with supervision Pt Will Perform Toileting - Clothing Manipulation and hygiene: with supervision;sit to/from stand  OT Frequency: Min 1X/week   Barriers to D/C: Other (comment)  none known at this time          AM-PAC OT "6 Clicks" Daily Activity     Outcome Measure Help from another person eating meals?: A Little Help from another person taking care of personal grooming?: A Little Help from another person toileting, which includes using toliet, bedpan, or urinal?: A Lot Help from another person bathing (including washing, rinsing, drying)?: A Lot Help from another person to put on  and taking off regular upper body clothing?: A Little Help from another person to put on and taking off regular lower body clothing?: A Lot 6 Click Score: 15   End of Session Equipment Utilized During Treatment: Rolling walker Nurse Communication: Mobility status  Activity Tolerance: Patient tolerated treatment well Patient left: in bed;with call bell/phone within reach;with bed alarm set  OT Visit Diagnosis: Unsteadiness on feet (R26.81);Muscle weakness (generalized) (M62.81)                Time: 6378-5885 OT Time Calculation (min): 22 min Charges:  OT General Charges $OT Visit: 1 Visit OT Evaluation $OT Eval Moderate Complexity: 1 Mod OT Treatments $Therapeutic Activity: 8-22 mins  Jackquline Denmark, MS, OTR/L , CBIS ascom [phone]  09/21/19, 11:02 AM

## 2019-09-21 NOTE — Care Management Obs Status (Signed)
MEDICARE OBSERVATION STATUS NOTIFICATION   Patient Details  Name: [name] MRN: [id number] Date of Birth: 1933/04/02   Medicare Observation Status Notification Given:  Yes    Allayne Butcher, RN 09/21/2019, 10:47 AM

## 2019-09-21 NOTE — Progress Notes (Signed)
*  PRELIMINARY RESULTS* Echocardiogram 2D Echocardiogram has been performed.  Cristela Blue 09/21/2019, 1:55 PM

## 2019-09-22 ENCOUNTER — Observation Stay: Payer: Medicare HMO

## 2019-09-22 ENCOUNTER — Encounter: Payer: Self-pay | Admitting: Internal Medicine

## 2019-09-22 DIAGNOSIS — I6623 Occlusion and stenosis of bilateral posterior cerebral arteries: Secondary | ICD-10-CM | POA: Diagnosis present

## 2019-09-22 DIAGNOSIS — Z7982 Long term (current) use of aspirin: Secondary | ICD-10-CM | POA: Diagnosis not present

## 2019-09-22 DIAGNOSIS — I639 Cerebral infarction, unspecified: Secondary | ICD-10-CM | POA: Diagnosis not present

## 2019-09-22 DIAGNOSIS — R262 Difficulty in walking, not elsewhere classified: Secondary | ICD-10-CM | POA: Diagnosis present

## 2019-09-22 DIAGNOSIS — E1142 Type 2 diabetes mellitus with diabetic polyneuropathy: Secondary | ICD-10-CM | POA: Diagnosis present

## 2019-09-22 DIAGNOSIS — I6602 Occlusion and stenosis of left middle cerebral artery: Secondary | ICD-10-CM | POA: Diagnosis present

## 2019-09-22 DIAGNOSIS — I10 Essential (primary) hypertension: Secondary | ICD-10-CM | POA: Diagnosis present

## 2019-09-22 DIAGNOSIS — E876 Hypokalemia: Secondary | ICD-10-CM | POA: Diagnosis not present

## 2019-09-22 DIAGNOSIS — N3000 Acute cystitis without hematuria: Secondary | ICD-10-CM | POA: Diagnosis present

## 2019-09-22 DIAGNOSIS — G8194 Hemiplegia, unspecified affecting left nondominant side: Secondary | ICD-10-CM | POA: Diagnosis not present

## 2019-09-22 DIAGNOSIS — G9341 Metabolic encephalopathy: Secondary | ICD-10-CM | POA: Diagnosis present

## 2019-09-22 DIAGNOSIS — Z79899 Other long term (current) drug therapy: Secondary | ICD-10-CM | POA: Diagnosis not present

## 2019-09-22 DIAGNOSIS — Z20822 Contact with and (suspected) exposure to covid-19: Secondary | ICD-10-CM | POA: Diagnosis present

## 2019-09-22 DIAGNOSIS — I6523 Occlusion and stenosis of bilateral carotid arteries: Secondary | ICD-10-CM | POA: Diagnosis present

## 2019-09-22 DIAGNOSIS — Z96653 Presence of artificial knee joint, bilateral: Secondary | ICD-10-CM | POA: Diagnosis present

## 2019-09-22 DIAGNOSIS — Z7984 Long term (current) use of oral hypoglycemic drugs: Secondary | ICD-10-CM | POA: Diagnosis not present

## 2019-09-22 DIAGNOSIS — N3001 Acute cystitis with hematuria: Secondary | ICD-10-CM | POA: Diagnosis not present

## 2019-09-22 DIAGNOSIS — R29710 NIHSS score 10: Secondary | ICD-10-CM | POA: Diagnosis present

## 2019-09-22 DIAGNOSIS — R471 Dysarthria and anarthria: Secondary | ICD-10-CM | POA: Diagnosis not present

## 2019-09-22 DIAGNOSIS — I6521 Occlusion and stenosis of right carotid artery: Secondary | ICD-10-CM | POA: Diagnosis not present

## 2019-09-22 DIAGNOSIS — E1151 Type 2 diabetes mellitus with diabetic peripheral angiopathy without gangrene: Secondary | ICD-10-CM | POA: Diagnosis present

## 2019-09-22 DIAGNOSIS — I160 Hypertensive urgency: Secondary | ICD-10-CM | POA: Diagnosis present

## 2019-09-22 DIAGNOSIS — R29810 Facial weakness: Secondary | ICD-10-CM | POA: Diagnosis not present

## 2019-09-22 DIAGNOSIS — G5603 Carpal tunnel syndrome, bilateral upper limbs: Secondary | ICD-10-CM | POA: Diagnosis present

## 2019-09-22 DIAGNOSIS — N390 Urinary tract infection, site not specified: Secondary | ICD-10-CM | POA: Diagnosis present

## 2019-09-22 DIAGNOSIS — I6501 Occlusion and stenosis of right vertebral artery: Secondary | ICD-10-CM | POA: Diagnosis present

## 2019-09-22 DIAGNOSIS — E782 Mixed hyperlipidemia: Secondary | ICD-10-CM | POA: Diagnosis present

## 2019-09-22 LAB — GLUCOSE, CAPILLARY
Glucose-Capillary: 177 mg/dL — ABNORMAL HIGH (ref 70–99)
Glucose-Capillary: 215 mg/dL — ABNORMAL HIGH (ref 70–99)
Glucose-Capillary: 161 mg/dL — ABNORMAL HIGH (ref 70–99)
Glucose-Capillary: 194 mg/dL — ABNORMAL HIGH (ref 70–99)

## 2019-09-22 LAB — ECHOCARDIOGRAM COMPLETE
AR max vel: 1.88 cm2
AV Area VTI: 2.27 cm2
AV Area mean vel: 1.94 cm2
AV Mean grad: 3.7 mmHg
AV Peak grad: 6.9 mmHg
Ao pk vel: 1.31 m/s
Area-P 1/2: 1.97 cm2
S' Lateral: 2.47 cm

## 2019-09-22 LAB — URIC ACID: Uric Acid, Serum: 6.4 mg/dL (ref 2.5–7.1)

## 2019-09-22 MED ORDER — ASPIRIN EC 325 MG PO TBEC
325.0000 mg | DELAYED_RELEASE_TABLET | Freq: Every day | ORAL | Status: DC
  Administered 2019-09-22 – 2019-09-27 (×6): 325 mg via ORAL
  Filled 2019-09-22 (×5): qty 1

## 2019-09-22 MED ORDER — IOHEXOL 350 MG/ML SOLN
75.0000 mL | Freq: Once | INTRAVENOUS | Status: AC | PRN
  Administered 2019-09-22: 75 mL via INTRAVENOUS

## 2019-09-22 MED ORDER — SODIUM CHLORIDE 0.9 % IV SOLN: INTRAVENOUS | Status: DC

## 2019-09-22 MED ORDER — LISINOPRIL 20 MG PO TABS
20.0000 mg | ORAL_TABLET | Freq: Every day | ORAL | Status: DC
  Administered 2019-09-22: 20 mg via ORAL
  Filled 2019-09-22: qty 1

## 2019-09-22 MED ORDER — STROKE: EARLY STAGES OF RECOVERY BOOK: Freq: Once | Status: AC

## 2019-09-22 NOTE — Consult Note (Signed)
Neurology Consultation  Reason for Consult: Left-sided weakness Referring Physician: Dr. Sunnie Nielsen, [facility]  CC: Left-sided weakness.  History is obtained from-patient, chart review  HPI: [name] is a 86 y.o. female past medical history of diabetes, diabetic polyneuropathy, hyperlipidemia, hypertension presented to the emergency room 09/20/2019 for dizziness and increased urinary frequency. She was admitted and treated for possible UTI, with antibiotics. She was also found to be confused, and her SBP was markedly elevated, which also required treatment.  This was managed with antihypertensives. This morning, on hospitalist rounds, she was found unable to move her left arm and leg.  Also noted to have mild left nasolabial fold flattening, for which I was called by the hospitalist and asked her to activate a code stroke since they could not really gather a good last known well. When I spoke with the patient, she said that the last she moved her left arm and leg was sometime a day and a half ago.  She had been feeling weak and not be able to move that and has been having difficulty ambulating because of that. She was already sent in for a head CT which was done prior to me examining her.  The head CT was read by radiology as showing a right pontine hypodensity-I have reviewed it myself and agree that there is a hypodensity there but the caveat being that that area is very susceptible to artifacts. Irrespective, she reports more pain in the left shoulder while passively trying to move that arm as well.  Does not report headache.  Does not report any visual symptoms at this time. Attempt to reach family for collateral over phone was unsuccessful at the time of dictation of this note.    LKW: Unclear-definitely more than a day or so ago. tpa given?: no, outside the window Premorbid modified Rankin scale (mRS): 2   ROS: Unable to reliably obtain but attempted and pertinent  positives in the HPI.  Past Medical History:  Diagnosis Date  . Carpal tunnel syndrome on both sides   . Diabetes (HCC)   . Diabetic polyneuropathy (HCC)   . Diverticulitis   . Hyperlipidemia   . Hypertension   . Osteoarthritis   . Vaginal prolapse     Family History  Problem Relation Age of Onset  . Breast cancer Mother 78  . Breast cancer Maternal Aunt     Social History:   reports that she has never smoked. She has never used smokeless tobacco. She reports that she does not drink alcohol and does not use drugs.  Medications  Current Facility-Administered Medications:  .   stroke: mapping our early stages of recovery book, , Does not apply, Once, Regalado, Belkys A, MD .  0.9 %  sodium chloride infusion, , Intravenous, Continuous, Regalado, Belkys A, MD .  acetaminophen (TYLENOL) tablet 650 mg, 650 mg, Oral, Q6H PRN **OR** acetaminophen (TYLENOL) suppository 650 mg, 650 mg, Rectal, Q6H PRN, Briant Cedar, MD .  albuterol (PROVENTIL) (2.5 MG/3ML) 0.083% nebulizer solution 2.5 mg, 2.5 mg, Nebulization, Q2H PRN, Briant Cedar, MD .  aspirin EC tablet 81 mg, 81 mg, Oral, Daily, Briant Cedar, MD, 81 mg at 09/22/19 0829 .  cefTRIAXone (ROCEPHIN) 1 g in sodium chloride 0.9 % 100 mL IVPB, 1 g, Intravenous, Q24H, Briant Cedar, MD, Last Rate: 200 mL/hr at 09/22/19 0552, 1 g at 09/22/19 0552 .  enoxaparin (LOVENOX) injection 40 mg, 40 mg, Subcutaneous, Q24H, Ezenduka, Monica Martinez, MD, 40 mg  at 09/21/19 1403 .  hydrALAZINE (APRESOLINE) tablet 25 mg, 25 mg, Oral, Q8H PRN, Briant Cedar, MD, 25 mg at 09/20/19 2332 .  hydrochlorothiazide (HYDRODIURIL) tablet 25 mg, 25 mg, Oral, Daily, Regalado, Belkys A, MD, 25 mg at 09/22/19 0829 .  HYDROcodone-acetaminophen (NORCO/VICODIN) 5-325 MG per tablet 1 tablet, 1 tablet, Oral, Q6H PRN, Briant Cedar, MD, 1 tablet at 09/22/19 0829 .  insulin aspart (novoLOG) injection 0-5 Units, 0-5 Units, Subcutaneous, QHS,  Ezenduka, Monica Martinez, MD .  insulin aspart (novoLOG) injection 0-9 Units, 0-9 Units, Subcutaneous, TID WC, Briant Cedar, MD, 2 Units at 09/22/19 [phone] .  lisinopril (ZESTRIL) tablet 20 mg, 20 mg, Oral, Daily, Regalado, Belkys A, MD, 20 mg at 09/22/19 0829 .  ondansetron (ZOFRAN) tablet 4 mg, 4 mg, Oral, Q6H PRN **OR** ondansetron (ZOFRAN) injection 4 mg, 4 mg, Intravenous, Q6H PRN, Briant Cedar, MD .  polyethylene glycol (MIRALAX / GLYCOLAX) packet 17 g, 17 g, Oral, Daily PRN, Briant Cedar, MD .  simvastatin (ZOCOR) tablet 40 mg, 40 mg, Oral, QHS, Briant Cedar, MD, 40 mg at 09/21/19 2141 .  vitamin B-12 (CYANOCOBALAMIN) tablet 100 mcg, 100 mcg, Oral, Daily, Regalado, Belkys A, MD, 100 mcg at 09/21/19 1733   Exam: Current vital signs: BP (!) 147/59 (BP Location: Right Arm)   Pulse 67   Temp 98.1 F (36.7 C)   Resp 18   Ht 5' (1.524 m)   Wt 76.2 kg   SpO2 96%   BMI 32.81 kg/m  Vital signs in last 24 hours: Temp:  [98.1 F (36.7 C)-98.5 F (36.9 C)] 98.1 F (36.7 C) (08/12 0918) Pulse Rate:  [61-76] 67 (08/12 0918) Resp:  [16-20] 18 (08/12 0918) BP: (147-179)/(59-73) 147/59 (08/12 0918) SpO2:  [93 %-100 %] 96 % (08/12 0918) General: Awake alert in no distress HEENT: Normocephalic atraumatic Lungs: Clear Cardiovascular: Regular rhythm Abdomen soft nondistended nontender Extremities warm well perfused Neurological exam Awake alert oriented to place and self.  Could not tell me the date. Speech is mildly dysarthric No evidence of aphasia Cranial nerves: Pupils equal round react light, extraocular movements intact, visual fields appear full, facial sensation appears intact, face examination reveals mild asymmetry with flattening of the left nasolabial fold.  Auditory acuity is mildly reduced bilaterally.  Tongue and palate midline. Motor exam: Left upper extremity is barely 1/5.  Left lower extremity also is weak and is a 1-2/5.  Right upper and lower  extremity are full strength. Sensory exam: Intact to touch all over Coordination: No dysmetria on the right.  Unable to perform on the left NIH stroke scale 1a Level of Conscious.: 0 1b LOC Questions: 2 1c LOC Commands: 0 2 Best Gaze: 0 3 Visual: 0 4 Facial Palsy: 1 5a Motor Arm - left: 3 5b Motor Arm - Right: 0 6a Motor Leg - Left: 3 6b Motor Leg - Right: 0 7 Limb Ataxia: 0 8 Sensory: 0 9 Best Language: 0 10 Dysarthria: 1 11 Extinct. and Inatten.: 0 TOTAL: 10  Labs I have reviewed labs in epic and the results pertinent to this consultation are:  CBC    Component Value Date/Time   WBC 8.6 09/21/2019 0459   RBC 3.67 (L) 09/21/2019 0459   HGB 11.4 (L) 09/21/2019 0459   HGB 8.6 (L) 09/25/2013 0410   HCT 35.9 (L) 09/21/2019 0459   HCT 27.0 (L) 09/24/2013 0409   PLT 191 09/21/2019 0459   PLT 192 09/24/2013 0409   MCV  97.8 09/21/2019 0459   MCV 96 09/24/2013 0409   MCH 31.1 09/21/2019 0459   MCHC 31.8 09/21/2019 0459   RDW 12.3 09/21/2019 0459   RDW 13.8 09/24/2013 0409   LYMPHSABS 1.0 03/22/2018 1755   LYMPHSABS 2.5 09/24/2013 0409   MONOABS 1.1 (H) 03/22/2018 1755   MONOABS 0.7 09/24/2013 0409   EOSABS 0.0 03/22/2018 1755   EOSABS 0.4 09/24/2013 0409   BASOSABS 0.0 03/22/2018 1755   BASOSABS 0.0 09/24/2013 0409   CMP     Component Value Date/Time   NA 142 09/21/2019 0459   NA 143 09/24/2013 0409   K 4.0 09/21/2019 0459   K 3.6 09/24/2013 0409   CL 106 09/21/2019 0459   CL 108 (H) 09/24/2013 0409   CO2 29 09/21/2019 0459   CO2 27 09/24/2013 0409   GLUCOSE 145 (H) 09/21/2019 0459   GLUCOSE 222 (H) 09/24/2013 0409   BUN 17 09/21/2019 0459   BUN 7 09/24/2013 0409   CREATININE 0.69 09/21/2019 0459   CREATININE 0.77 09/24/2013 0409   CALCIUM 8.9 09/21/2019 0459   CALCIUM 7.7 (L) 09/24/2013 0409   PROT 7.4 03/22/2018 1755   PROT 5.4 (L) 09/22/2013 0507   ALBUMIN 3.5 03/22/2018 1755   ALBUMIN 2.3 (L) 09/22/2013 0507   AST 37 03/22/2018 1755   AST 19  09/22/2013 0507   ALT 20 03/22/2018 1755   ALT 13 (L) 09/22/2013 0507   ALKPHOS 85 03/22/2018 1755   ALKPHOS 75 09/22/2013 0507   BILITOT 0.9 03/22/2018 1755   BILITOT 0.3 09/22/2013 0507   GFRNONAA >60 09/21/2019 0459   GFRNONAA >60 09/24/2013 0409   GFRAA >60 09/21/2019 0459   GFRAA >60 09/24/2013 0409   Imaging I have reviewed the images obtained: CT-scan of the brain today to one obtained 2 days ago - possible right pontine hypodenisty new from 2 days ago.  Assessment:  86 year old woman admitted to the hospital for treatment of UTI as well as possible hypertensive urgency, noted to have left-sided weakness. She is unable to reliably tell us the last normal.  She is definitely outside the window for IV TPA but probably also outside the window for any intervention as well as exam not consistent with an LVO. I will complete the stroke work-up because her clinical exam does reveal strokelike features on examination-left hemiparesis, left facial weakness and dysarthria.  Impression: Evaluate for acute ischemic stroke-likely small vessel etiology  Recommendations: -Telemetry -Frequent neurochecks -CTA head and neck-stat ordered. -Aspirin -High-dose statin -2D echo -Check A1c and lipid panel -For blood pressures: Allow for permissive hypertension-at least for the next 24 to 48 hours-treat only if systolic blood pressures are greater than 220 on a as needed basis. -PT, OT, speech therapy -N.p.o. until cleared by bedside stroke swallow screen or formal swallow evaluation attempted to call the family multiple times with no response.  Daughter's phone number appears disconnected.  Son's phone number goes straight to voicemail. -Will reattempt calling during the day and will be available to talk to them   Discussed my plan with the primary hospitalist.  -- Milon Dikes, MD [facility] Pager: [phone] If 7pm to 7am, please call on call as listed on [facility].

## 2019-09-22 NOTE — Evaluation (Signed)
Clinical/Bedside Swallow Evaluation Patient Details  Name: [name] MRN: [id number] Date of Birth: Jul 11, 1933  Today's Date: 09/22/2019 Time: SLP Start Time (ACUTE ONLY): 1310 SLP Stop Time (ACUTE ONLY): 1330 SLP Time Calculation (min) (ACUTE ONLY): 20 min  Past Medical History:  Past Medical History:  Diagnosis Date  . Carpal tunnel syndrome on both sides   . Diabetes (HCC)   . Diabetic polyneuropathy (HCC)   . Diverticulitis   . Hyperlipidemia   . Hypertension   . Osteoarthritis   . Vaginal prolapse    Past Surgical History:  Past Surgical History:  Procedure Laterality Date  . CESAREAN SECTION    . pessary    . REPLACEMENT TOTAL KNEE BILATERAL    . VENTRAL HERNIA REPAIR    . VENTRAL HERNIA REPAIR N/A 01/13/2016   Procedure: HERNIA REPAIR VENTRAL ADULT WITH SMALL BOWEL RESECTION, LYSIS OF ADHESIONS;  Surgeon: Gladis Riffle, MD;  Location: [facility] ORS;  Service: General;  Laterality: N/A;   HPI:  86 year old past medical history significant for hypertension, hyperlipidemia, diverticulitis, diabetes who presents with acute metabolic encephalopathy, unsteady gait, frequent urination and right thigh discomfort for the last few days. Patient presented with hypertensive crisis, UA positive for UTI, Kovic 19 test negative.  CT head negative for acute intracranial abnormality.  Chest x-ray; no acute cardiopulmonary disease. On 09/22/19 pt was noted to have markedly decreased LLE/LUE functional movement and code stroke was called. F/u imaging showed stable left basal ganglia infarct as well as "area of decreased attenuation in the right half of the pons which may represent an evolving infarct".   Assessment / Plan / Recommendation Clinical Impression  Pt present with moderate left facial weakness but is able to maintain labial seal during consumption of regular diet textures and thin liquids. During this bedside swallow evaluation, she consumed regular diet textures with grossly  adequate oropharyngeal abilities. She had appropriate oral phase with no pocketing in the left buccual cavity. When consuming thin liquids, her swallow appeared swift and was free of any over s/s of aspiration at bedside. While silent aspiration cannot be ruled out at bedside, pt appears at reduced risk of aspiration when following general aspiration precuations. Given pt's LUE deficits would recommend dysphagia 3 diet (as she is unable to hold or cut items), thin liquids and medicine whole with thin liquids or in puree. Skilled ST intervention is not indicated at this time for dysphagia.  SLP Visit Diagnosis: Dysphagia, unspecified (R13.10)    Aspiration Risk  No limitations    Diet Recommendation   Dysphagia 3 with thin liquids  Medication Administration: Whole meds with liquid (or whole in puree)    Other  Recommendations Oral Care Recommendations: Oral care BID   Follow up Recommendations None        Swallow Study   General Date of Onset: 09/22/19 HPI: 86 year old past medical history significant for hypertension, hyperlipidemia, diverticulitis, diabetes who presents with acute metabolic encephalopathy, unsteady gait, frequent urination and right thigh discomfort for the last few days. Patient presented with hypertensive crisis, UA positive for UTI, Kovic 19 test negative.  CT head negative for acute intracranial abnormality.  Chest x-ray; no acute cardiopulmonary disease. On 09/22/19 pt was noted to have markedly decreased LLE/LUE functional movement and code stroke was called. F/u imaging showed stable left basal ganglia infarct as well as "area of decreased attenuation in the right half of the pons which may represent an evolving infarct". Type of Study: Bedside Swallow Evaluation Previous Swallow  Assessment: none in chart Diet Prior to this Study: NPO Temperature Spikes Noted: No Respiratory Status: Room air History of Recent Intubation: No Behavior/Cognition:  Alert;Cooperative;Pleasant mood Oral Cavity Assessment: Within Functional Limits Oral Care Completed by SLP: No Oral Cavity - Dentition: Adequate natural dentition Vision: Functional for self-feeding Self-Feeding Abilities: Able to feed self;Needs assist Patient Positioning: Upright in bed Baseline Vocal Quality: Normal Volitional Cough: Strong Volitional Swallow: Able to elicit    Oral/Motor/Sensory Function Overall Oral Motor/Sensory Function: Moderate impairment Facial ROM: Reduced left Facial Symmetry: Abnormal symmetry left Facial Strength: Reduced left Facial Sensation: Reduced left Lingual ROM: Within Functional Limits Lingual Symmetry: Within Functional Limits Lingual Strength: Within Functional Limits Lingual Sensation: Within Functional Limits Velum: Within Functional Limits Mandible: Within Functional Limits   Ice Chips Ice chips: Within functional limits Presentation: Spoon   Thin Liquid Thin Liquid: Within functional limits Presentation: Straw    Nectar Thick Nectar Thick Liquid: Not tested   Honey Thick Honey Thick Liquid: Not tested   Puree Puree: Within functional limits Presentation: Spoon   Solid     Solid: Within functional limits Presentation: Self Fed     Jolene Guyett B. Dreama Saa M.S., CCC-SLP, [facility] Speech-Language Pathologist Rehabilitation Services Office [phone]  Azarie Coriz Dreama Saa 09/22/2019,5:20 PM

## 2019-09-22 NOTE — Progress Notes (Signed)
CH paged for CODE STROKE; when CH arrived, pt. already in CT scan, no family present.  CH may attempt follow-up visit later today if possible.

## 2019-09-22 NOTE — Progress Notes (Signed)
PROGRESS NOTE    [name]  OAC:[id number] DOB: 02-07-1934 DOA: 09/20/2019 PCP: Gracelyn Nurse, MD   Brief Narrative: 86 year old past medical history significant for hypertension, hyperlipidemia, diverticulitis, diabetes who presents with acute metabolic encephalopathy, unsteady gait, frequent urination and right thigh discomfort for the last few days.  Patient presented with hypertensive crisis, UA positive for UTI, Kovic 19 test negative.  CT head negative for acute intracranial abnormality.  Chest x-ray; no acute cardiopulmonary disease  Assessment & Plan:   Principal Problem:   UTI (urinary tract infection) Active Problems:   Type 2 diabetes mellitus with polyneuropathy (HCC)   Mixed hyperlipidemia   Essential hypertension   1-Left side weakness;  Patient complained of left arm and leg pain. She was not able to move left side, due to pain and weakness. Patient reported she wake up with this symptoms, left side pain and weakness. I discussed with patient's Daughter; patient was able to move left side prior to admission. I consulted Neurology. Plan was for activation of Code stroke.  -Not TPA candidate, unknown time of symptoms.  -CT head ordered stat: Chronic white matter ischemic change. Stable left basal ganglia Infarct. Area of decreased attenuation in the right half of the pons which may represent an evolving infarct. Follow-up imaging may be helpful. -Aspirin increase to 325 mg. Continue with statins.  -Stroke order set implemented.  -CTA; No acute large or medium vessel occlusion. Atherosclerotic disease at both carotid bifurcations but without ICA stenosis. Severe bilateral external carotid artery stenoses.Moderate to severe stenosis of the left MCA M1 segment. Moderate stenosis of both posterior cerebral arteries at the P1 P2 junction regions. Stenosis of the distal right vertebral artery just proximal to the basilar artery. -permissive HTN.   Acute metabolic  encephalopathy likely secondary to UTI Patient is alert and oriented. UA with 21-50 white blood cells. Continue with IV ceftriaxone. CT head negative for acute abnormality. Unable to perform MRI due to foreign body eye  Urine culture multiple species present  Hypertensive crisis, uncontrolled hypertension: Chest x-ray negative. Doppler lower extremity negative for DVT Permissive HTN due to stroke, hold lisinopril and HCTZ Echo; normal ef.   Dizziness/unsteady gait: CT negative. Patient unable to get MRI Check B12 low normal; 227. Started supplement.  PT eval  Diabetes type 2 Continue with a sliding scale insulin  Hyperlipidemia continue with statins  Estimated body mass index is 32.81 kg/m as calculated from the following:   Height as of this encounter: 5' (1.524 m).   Weight as of this encounter: 76.2 kg.   DVT prophylaxis: Lovenox Code Status: Full code Family Communication: Daughter over phone.  Disposition Plan:  Status is: inpatient.    Dispo: The patient is from: Home              Anticipated d/c is to: Home              Anticipated d/c date is: 2 days              Patient currently is not medically stable to d/c.  Patient with left side weakness, likely stroke.        Consultants:   None  Procedures:   Echo  Antimicrobials:  Ceftriaxone  Subjective: Patient seen during rounds at 10;00 am.  She is complaining of the left leg pain, sharp pain unable to move the left leg.  She was also complaining of left arm pain and weakness.  She said that she woke up with  these  symptoms.  She does not recall at what  time she went to bed.   Objective: Vitals:   09/21/19 1955 09/21/19 2334 09/22/19 0351 09/22/19 0918  BP: (!) 171/69 (!) 155/65 (!) 179/68 (!) 147/59  Pulse: 76 67 66 67  Resp: 20 18 20 18   Temp: 98.5 F (36.9 C) 98.2 F (36.8 C) 98.4 F (36.9 C) 98.1 F (36.7 C)  TempSrc:      SpO2: 95% 93% 95% 96%  Weight:      Height:         Intake/Output Summary (Last 24 hours) at 09/22/2019 1042 Last data filed at 09/22/2019 0500 Gross per 24 hour  Intake 480 ml  Output 400 ml  Net 80 ml   Filed Weights   09/19/19 2024  Weight: 76.2 kg    Examination:  General exam: NAD Respiratory system: CTA Cardiovascular system: S 1, S 2 RRR Gastrointestinal system:BS present, sof,t nt Central nervous system: Alert and oriented. Speech is clear, mild flattening left nasal fold, left upper and lower extremities 0/5. Pain on movement of left upper extremity  Extremities: no edema Skin: no rashes   Data Reviewed: I have personally reviewed following labs and imaging studies  CBC: Recent Labs  Lab 09/19/19 2029 09/21/19 0459  WBC 10.1 8.6  HGB 12.2 11.4*  HCT 38.1 35.9*  MCV 97.7 97.8  PLT 242 191   Basic Metabolic Panel: Recent Labs  Lab 09/19/19 2029 09/21/19 0459  NA 139 142  K 4.4 4.0  CL 103 106  CO2 28 29  GLUCOSE 234* 145*  BUN 15 17  CREATININE 0.75 0.69  CALCIUM 9.3 8.9   GFR: Estimated Creatinine Clearance: 48.6 mL/min (by C-G formula based on SCr of 0.69 mg/dL). Liver Function Tests: No results for input(s): AST, ALT, ALKPHOS, BILITOT, PROT, ALBUMIN in the last 168 hours. No results for input(s): LIPASE, AMYLASE in the last 168 hours. No results for input(s): AMMONIA in the last 168 hours. Coagulation Profile: No results for input(s): INR, PROTIME in the last 168 hours. Cardiac Enzymes: No results for input(s): CKTOTAL, CKMB, CKMBINDEX, TROPONINI in the last 168 hours. BNP (last 3 results) No results for input(s): PROBNP in the last 8760 hours. HbA1C: Recent Labs    09/21/19 0459  HGBA1C 8.1*   CBG: Recent Labs  Lab 09/21/19 0738 09/21/19 1349 09/21/19 1621 09/21/19 2127 09/22/19 0801  GLUCAP 132* 170* 142* 175* 177*   Lipid Profile: No results for input(s): CHOL, HDL, LDLCALC, TRIG, CHOLHDL, LDLDIRECT in the last 72 hours. Thyroid Function Tests: No results for input(s):  TSH, T4TOTAL, FREET4, T3FREE, THYROIDAB in the last 72 hours. Anemia Panel: Recent Labs    09/21/19 0458  VITAMINB12 277   Sepsis Labs: No results for input(s): PROCALCITON, LATICACIDVEN in the last 168 hours.  Recent Results (from the past 240 hour(s))  Urine culture     Status: Abnormal   Collection Time: 09/19/19  8:29 PM   Specimen: Urine, Random  Result Value Ref Range Status   Specimen Description   Final    URINE, RANDOM Performed at [facility], [address] [place], [place]    Special Requests   Final    NONE Performed at [facility], [address] [place], [place]    Culture MULTIPLE SPECIES PRESENT, SUGGEST RECOLLECTION (A)  Final   Report Status 09/21/2019 FINAL  Final  SARS Coronavirus 2 by RT PCR (hospital order, performed in [facility]  lab) Nasopharyngeal Nasopharyngeal Swab     Status: None   Collection Time: 09/20/19  9:04 AM   Specimen: Nasopharyngeal Swab  Result Value Ref Range Status   SARS Coronavirus 2 NEGATIVE NEGATIVE Final    Comment: (NOTE) SARS-CoV-2 target nucleic acids are NOT DETECTED.  The SARS-CoV-2 RNA is generally detectable in upper and lower respiratory specimens during the acute phase of infection. The lowest concentration of SARS-CoV-2 viral copies this assay can detect is 250 copies / mL. A negative result does not preclude SARS-CoV-2 infection and should not be used as the sole basis for treatment or other patient management decisions.  A negative result may occur with improper specimen collection / handling, submission of specimen other than nasopharyngeal swab, presence of viral mutation(s) within the areas targeted by this assay, and inadequate number of viral copies (<250 copies / mL). A negative result must be combined with clinical observations, patient history, and epidemiological information.  Fact Sheet for Patients:    BoilerBrush.com.cy  Fact Sheet for Healthcare Providers: https://pope.com/  This test is not yet approved or  cleared by the [place] FDA and has been authorized for detection and/or diagnosis of SARS-CoV-2 by FDA under an Emergency Use Authorization (EUA).  This EUA will remain in effect (meaning this test can be used) for the duration of the COVID-19 declaration under Section 564(b)(1) of the Act, 21 U.S.C. section 360bbb-3(b)(1), unless the authorization is terminated or revoked sooner.  Performed at [facility], [address] [place], [place] [address]          Radiology Studies: [facility] Chest [place] 1 View  Result Date: 09/20/2019 CLINICAL DATA:  Some difficulty breathing. Admitted for urinary tract infection EXAM: PORTABLE CHEST 1 VIEW COMPARISON:  03/25/2018 FINDINGS: Cardiac silhouette is normal in size. No mediastinal or hilar masses. Lungs are clear.  No convincing pleural effusion.  No pneumothorax. Skeletal structures are demineralized. Chronic bilateral shoulder arthropathic changes, stable. IMPRESSION: No acute cardiopulmonary disease. Electronically Signed   By: Amie Portland M.D.   On: 09/20/2019 15:50   ECHOCARDIOGRAM COMPLETE  Result Date: 09/22/2019    ECHOCARDIOGRAM REPORT   Patient Name:   HANAKO TIPPING Date of Exam: 09/21/2019 Medical Rec #:  [id number]        Height:       60.0 in Accession #:    [id number]       Weight:       168.0 lb Date of Birth:  18-Sep-1933       BSA:          1.733 m Patient Age:    85 years         BP:           168/95 mmHg Patient Gender: F                HR:           59 bpm. Exam Location:  [facility] Procedure: 2D Echo, Cardiac Doppler and Color Doppler Indications:     Dyspnea 786.09  History:         Patient has no prior history of Echocardiogram examinations.                  Risk Factors:Hypertension, Diabetes and Dyslipidemia.  Sonographer:     Cristela Blue RDCS (AE) Referring  Phys:  [id number] Monica Martinez [facility] Diagnosing Phys: Arnoldo Hooker MD IMPRESSIONS  1. Left ventricular ejection fraction, by estimation, is 55 to 60%. The left  ventricle has normal function. The left ventricle has no regional wall motion abnormalities. Left ventricular diastolic parameters were normal.  2. Right ventricular systolic function is normal. The right ventricular size is normal. There is mildly elevated pulmonary artery systolic pressure.  3. The mitral valve is normal in structure. Trivial mitral valve regurgitation.  4. The aortic valve is normal in structure. Aortic valve regurgitation is trivial. FINDINGS  Left Ventricle: Left ventricular ejection fraction, by estimation, is 55 to 60%. The left ventricle has normal function. The left ventricle has no regional wall motion abnormalities. The left ventricular internal cavity size was normal in size. There is  no left ventricular hypertrophy. Left ventricular diastolic parameters were normal. Right Ventricle: The right ventricular size is normal. No increase in right ventricular wall thickness. Right ventricular systolic function is normal. There is mildly elevated pulmonary artery systolic pressure. The tricuspid regurgitant velocity is 2.85  m/s, and with an assumed right atrial pressure of 10 mmHg, the estimated right ventricular systolic pressure is 42.5 mmHg. Left Atrium: Left atrial size was normal in size. Right Atrium: Right atrial size was normal in size. Pericardium: There is no evidence of pericardial effusion. Mitral Valve: The mitral valve is normal in structure. Trivial mitral valve regurgitation. Tricuspid Valve: The tricuspid valve is normal in structure. Tricuspid valve regurgitation is trivial. Aortic Valve: The aortic valve is normal in structure. Aortic valve regurgitation is trivial. Aortic valve mean gradient measures 3.7 mmHg. Aortic valve peak gradient measures 6.9 mmHg. Aortic valve area, by VTI measures 2.27 cm. Pulmonic Valve:  The pulmonic valve was normal in structure. Pulmonic valve regurgitation is not visualized. Aorta: The aortic root and ascending aorta are structurally normal, with no evidence of dilitation. IAS/Shunts: No atrial level shunt detected by color flow Doppler.  LEFT VENTRICLE PLAX 2D LVIDd:         3.53 cm  Diastology LVIDs:         2.47 cm  LV e' lateral:   9.14 cm/s LV PW:         1.43 cm  LV E/e' lateral: 6.6 LV IVS:        1.90 cm  LV e' medial:    3.70 cm/s LVOT diam:     2.00 cm  LV E/e' medial:  16.2 LV SV:         69 LV SV Index:   40 LVOT Area:     3.14 cm  RIGHT VENTRICLE RV Basal diam:  2.70 cm RV S prime:     14.70 cm/s TAPSE (M-mode): 3.2 cm LEFT ATRIUM             Index       RIGHT ATRIUM           Index LA diam:        3.40 cm 1.96 cm/m  RA Area:     13.50 cm LA Vol (A2C):   57.3 ml 33.06 ml/m RA Volume:   34.70 ml  20.02 ml/m LA Vol (A4C):   41.5 ml 23.94 ml/m LA Biplane Vol: 51.5 ml 29.71 ml/m  AORTIC VALVE                   PULMONIC VALVE AV Area (Vmax):    1.88 cm    PV Vmax:        0.64 m/s AV Area (Vmean):   1.94 cm    PV Peak grad:   1.7 mmHg AV Area (VTI):  2.27 cm    RVOT Peak grad: 2 mmHg AV Vmax:           131.00 cm/s AV Vmean:          93.233 cm/s AV VTI:            0.305 m AV Peak Grad:      6.9 mmHg AV Mean Grad:      3.7 mmHg LVOT Vmax:         78.30 cm/s LVOT Vmean:        57.600 cm/s LVOT VTI:          0.220 m LVOT/AV VTI ratio: 0.72  AORTA Ao Root diam: 2.50 cm MITRAL VALVE               TRICUSPID VALVE MV Area (PHT): 1.97 cm    TR Peak grad:   32.5 mmHg MV Decel Time: 385 msec    TR Vmax:        285.00 cm/s MV E velocity: 60.10 cm/s MV A velocity: 99.40 cm/s  SHUNTS MV E/A ratio:  0.60        Systemic VTI:  0.22 m                            Systemic Diam: 2.00 cm Arnoldo Hooker MD Electronically signed by Arnoldo Hooker MD Signature Date/Time: 09/22/2019/8:55:16 AM    Final         Scheduled Meds: .  stroke: mapping our early stages of recovery book   Does not  apply Once  . aspirin EC  81 mg Oral Daily  . enoxaparin (LOVENOX) injection  40 mg Subcutaneous Q24H  . hydrochlorothiazide  25 mg Oral Daily  . insulin aspart  0-5 Units Subcutaneous QHS  . insulin aspart  0-9 Units Subcutaneous TID WC  . lisinopril  20 mg Oral Daily  . simvastatin  40 mg Oral QHS  . vitamin B-12  100 mcg Oral Daily   Continuous Infusions: . sodium chloride    . cefTRIAXone (ROCEPHIN)  IV 1 g (09/22/19 0552)     LOS: 0 days    Time spent:35 minutes.     Alba Cory, MD [facility]   If 7PM-7AM, please contact night-coverage [email or web address]  09/22/2019, 10:42 AM

## 2019-09-22 NOTE — Evaluation (Signed)
Speech Language Pathology Evaluation Patient Details Name: [name] MRN: [id number] DOB: 30-Jun-1933 Today's Date: 09/22/2019 Time: 1330-1350 SLP Time Calculation (min) (ACUTE ONLY): 20 min  Problem List:  Patient Active Problem List   Diagnosis Date Noted  . UTI (urinary tract infection) 09/20/2019  . Sepsis (HCC) 03/22/2018  . Cystocele, midline 09/04/2017  . Incomplete uterine prolapse 09/04/2017  . Malnutrition of moderate degree 01/15/2016  . Dehydration   . Incarcerated ventral hernia   . Diabetic ketoacidosis without coma associated with type 2 diabetes mellitus (HCC)   . Type 2 diabetes mellitus with polyneuropathy (HCC) 08/06/2015  . Carpal tunnel syndrome, bilateral 07/10/2015  . Osteopenia of multiple sites 07/10/2015  . Arthralgia of multiple sites 06/27/2015  . Mixed hyperlipidemia 11/06/2014  . Hypomagnesemia 11/02/2013  . Essential hypertension 11/02/2013   Past Medical History:  Past Medical History:  Diagnosis Date  . Carpal tunnel syndrome on both sides   . Diabetes (HCC)   . Diabetic polyneuropathy (HCC)   . Diverticulitis   . Hyperlipidemia   . Hypertension   . Osteoarthritis   . Vaginal prolapse    Past Surgical History:  Past Surgical History:  Procedure Laterality Date  . CESAREAN SECTION    . pessary    . REPLACEMENT TOTAL KNEE BILATERAL    . VENTRAL HERNIA REPAIR    . VENTRAL HERNIA REPAIR N/A 01/13/2016   Procedure: HERNIA REPAIR VENTRAL ADULT WITH SMALL BOWEL RESECTION, LYSIS OF ADHESIONS;  Surgeon: Gladis Riffle, MD;  Location: [facility] ORS;  Service: General;  Laterality: N/A;   HPI:  86 year old past medical history significant for hypertension, hyperlipidemia, diverticulitis, diabetes who presents with acute metabolic encephalopathy, unsteady gait, frequent urination and right thigh discomfort for the last few days. Patient presented with hypertensive crisis, UA positive for UTI, Kovic 19 test negative.  CT head negative for acute  intracranial abnormality.  Chest x-ray; no acute cardiopulmonary disease. On 09/22/19 pt was noted to have markedly decreased LLE/LUE functional movement and code stroke was called. F/u imaging showed stable left basal ganglia infarct as well as "area of decreased attenuation in the right half of the pons which may represent an evolving infarct".   Assessment / Plan / Recommendation Clinical Impression  Pt present with moderate left facial weakness  but her speech intelligibility is not impacted by flaccidity. Unsure of pt's cognitive baseline, however per chart review previous OT and PT notes indicate functional cognitive ability prior to today. During this evaluation, pt was oriented but required extra time to follow 1-step directions and to solve basic problems. She also demonstrates lack of insight and awareness of new physical weaknesses during tasks. Per pt is was completely independent prior to this hospitalization. Given the above mentioned deficits, would recommend further therapy at SNF to target bill paying, money and medication management and cognitive awareness/anticipatory awareness when completing ADLs. These skills are more suited for next venue of care.     SLP Assessment  SLP Recommendation/Assessment: All further Speech Lanaguage Pathology  needs can be addressed in the next venue of care SLP Visit Diagnosis: Cognitive communication deficit (R41.841)    Follow Up Recommendations  Skilled Nursing facility          SLP Evaluation Cognition  Overall Cognitive Status: Impaired/Different from baseline Arousal/Alertness: Awake/alert Orientation Level: Oriented to person;Oriented to place;Oriented to time Attention: Sustained Sustained Attention: Impaired Sustained Attention Impairment: Verbal complex;Functional complex Memory: Appears intact (appears functional, can recall her medicines at baseline) Awareness: Impaired Awareness Impairment:  Emergent impairment Problem Solving:  Impaired Problem Solving Impairment: Functional basic Safety/Judgment: Impaired       Comprehension  Auditory Comprehension Overall Auditory Comprehension: Appears within functional limits for tasks assessed Visual Recognition/Discrimination Discrimination: Not tested Reading Comprehension Reading Status: Not tested    Expression Expression Primary Mode of Expression: Verbal Verbal Expression Overall Verbal Expression: Appears within functional limits for tasks assessed Written Expression Dominant Hand: Right   Oral / Motor  Oral Motor/Sensory Function Overall Oral Motor/Sensory Function: Moderate impairment Facial ROM: Reduced left Facial Symmetry: Abnormal symmetry left Facial Strength: Reduced left Facial Sensation: Reduced left Lingual ROM: Within Functional Limits Lingual Symmetry: Within Functional Limits Lingual Strength: Within Functional Limits Lingual Sensation: Within Functional Limits Velum: Within Functional Limits Mandible: Within Functional Limits Motor Speech Overall Motor Speech: Appears within functional limits for tasks assessed Respiration: Within functional limits Phonation: Normal Resonance: Within functional limits Articulation: Within functional limitis Intelligibility: Intelligible Motor Planning: Witnin functional limits Motor Speech Errors: Not applicable   GO                   Tipton Ballow B. Dreama Saa M.S., CCC-SLP, [facility] Speech-Language Pathologist Rehabilitation Services Office [phone]  Reuel [place] 09/22/2019, 5:29 PM

## 2019-09-22 NOTE — Evaluation (Signed)
Occupational Therapy Evaluation Patient Details Name: [name] MRN: [id number] DOB: 07/03/1933 Today's Date: 09/22/2019    History of Present Illness [name] is a 86 y.o. female past medical history of diabetes, diabetic polyneuropathy, hyperlipidemia, hypertension presented to the emergency room 09/20/2019 for dizziness and increased urinary frequency. She was admitted and treated for possible UTI, with antibiotics. She was also found to be confused, and her SBP was markedly elevated, which also required treatment.  This was managed with antihypertensives. On 09/22/19 pt was noted to have markedly decreased LLE/LUE functional movement and code stroke was called. F/u imaging shoed Stable left basal ganglia infarct as well as "Area of decreased attenuation in the right half of the pons which may represent an evolving infarct".   Clinical Impression   Pt seen for Co-OT/PT re-evaluation 2/2 onset of worsening L sided weakness noted at hospital rounds on 09/22/19. Of note, initial OT evaluation on 09/21/19 indicates pt was guarding her LUE 2/2 pain and notes L sided weakness as compared to RUE, however weakness appears to have worsened/evolved since this time. Pt presents with nearly flaccid LUE, with only trace palpable bicep activation during assessment. She complains of LUE pain, but sensation appears grossly intact. She requires maximal assist to move her RUE/RLE during session and appears to have decreased attention toward her L side. She is observed to allow her LUE to dangle to her side at rest, with prompting she is able to use her RUE for AAROM, however has difficulty with motor planning/sequencing this during session. Pt requires MOD A +2 for sup<>sit transfer during session, MOD/MAX +2 for STS attempts. Pt fatigues quickly with activity and endorses some dizziness after standing attempts. She is returned to bed and OT facilitates safe positioning/education on strategies to support LUE  safety, skin integrity, and functional use with pt and dtr at bedside. Both return verbalize understanding of education provided. Would benefit from further review and continues to benefit from skilled OT services during this admission. Pt goals adjusted to reflect current functional limitations/needs. DC recommendation changed to STR to maximize pt safety and return to PLOF.    Follow Up Recommendations  Supervision/Assistance - 24 hour;SNF    Equipment Recommendations  3 in 1 bedside commode    Recommendations for Other Services       Precautions / Restrictions Precautions Precautions: Fall Restrictions Weight Bearing Restrictions: No      Mobility Bed Mobility Overal bed mobility: Needs Assistance Bed Mobility: Supine to Sit;Sit to Supine     Supine to sit: Mod assist;+2 for physical assistance Sit to supine: Mod assist;Max assist;+2 for physical assistance      Transfers Overall transfer level: Needs assistance Equipment used: 2 person hand held assist Transfers: Sit to/from Stand;Lateral/Scoot Transfers Sit to Stand: +2 physical assistance;Max assist;Mod assist        Lateral/Scoot Transfers: Max assist;Mod assist;+2 physical assistance General transfer comment: Pt requires significant physical assist for mgt of L side during functional mobility this date. She requires +2 MOD/MAX A for STS from EOB and is unable to come to full stand. MAX/TOTAL assist to scoot hips toward EOB for reposition.    Balance Overall balance assessment: Needs assistance Sitting-balance support: Feet supported;Single extremity supported Sitting balance-Leahy Scale: Poor Sitting balance - Comments: Variable level of assist during session with consistent cueing to maintain upright posture. Generally requires MIN A to CGA to maintain static seated balance 2/2 posterior lean. Postural control: Posterior lean Standing balance support: Bilateral upper extremity  supported;During functional  activity Standing balance-Leahy Scale: Zero Standing balance comment: Unable to maintain standing balance without MAX Aassist                           ADL either performed or assessed with clinical judgement   ADL Overall ADL's : Needs assistance/impaired Eating/Feeding: Moderate assistance;Sitting;Bed level;NPO Eating/Feeding Details (indicate cue type and reason): Pt NPO as of time of OT re-eval. Anticipate MOD A, particularly for 2 handed tasks including cutting and opening small containers 2/2 current functional deficits. Grooming: Sitting;Moderate assistance Grooming Details (indicate cue type and reason): MOD A for facilitation of LUE movements into grooming tasks. +2 to support sitting balance.                             Functional mobility during ADLs: +2 for physical assistance General ADL Comments: +2 MAX A to attempt stand at EOB. Unable to attempt further functional mobility 2/2 fatigue. Pt is unable to maintain standing balance without significant assist.     Vision Baseline Vision/History: Wears glasses Wears Glasses: At all times Patient Visual Report: No change from baseline       Perception     Praxis Praxis Praxis-Other Comments: Pt noted with significant deficits in initiation and motor planning with bimanual or L sided movements.    Pertinent Vitals/Pain Pain Assessment: Faces Faces Pain Scale: Hurts little more Pain Location: LUE and LLE (LUE>LLE) with movement Pain Descriptors / Indicators: Discomfort;Grimacing;Sore Pain Intervention(s): Limited activity within patient's tolerance;Monitored during session;Repositioned     Hand Dominance Right   Extremity/Trunk Assessment Upper Extremity Assessment Upper Extremity Assessment: RUE deficits/detail;LUE deficits/detail RUE Deficits / Details: AROM limited but WFLs with shoulder elevation ~ 100 degrees LUE Deficits / Details: LUE generally flaccid, with minimal bicep activation  noted, pt is able to perform AAROM using RUE occasionally during session. Grip, elbow flex/ext, shoulder flex/abd, and wrist activation minimal grossly 1/5 t/o. Pt c/o pain in LUE with shoulder flexion, LUE notably edematous. LUE Sensation: decreased proprioception (Light touch sensation WFL) LUE Coordination: decreased fine motor;decreased gross motor   Lower Extremity Assessment Lower Extremity Assessment: Defer to PT evaluation;LLE deficits/detail LLE Deficits / Details: LLE notably weak and pt c/o some increased pain with movement. LLE Coordination: decreased fine motor;decreased gross motor   Cervical / Trunk Assessment Cervical / Trunk Assessment: Kyphotic   Communication Communication Communication: No difficulties   Cognition Arousal/Alertness: Awake/alert Behavior During Therapy: WFL for tasks assessed/performed Overall Cognitive Status: Impaired/Different from baseline Area of Impairment: Awareness;Safety/judgement;Following commands                       Following Commands: Follows one step commands with increased time Safety/Judgement: Decreased awareness of deficits;Decreased awareness of safety Awareness: Emergent   General Comments: Pt oriented to self, place, and date. Initially does not recognize daughter when she enters the room, but does consistently identify her after min cueing from daughter. Decreased awareness of deficits.   General Comments       Exercises Other Exercises Other Exercises: Pt/caregiver educated on strategies to support LUE functional use and minimize edema. OT facilitates positioning of LUE on pillows to support joint stability, maximize safety, skin integrity, and functional use. Pt also educated on AAROM however education limited 2/2 pt cognitive status/decreased motor planning would benefit from further review. Other Exercises: OT/PT facilitate bed/functional mobility attempts this date. See above for  detail.   Shoulder  Instructions      Home Living Family/patient expects to be discharged to:: Private residence Living Arrangements: Alone Available Help at Discharge: Family;Available PRN/intermittently Type of Home: House Home Access: Stairs to enter [employer] of Steps: 1 Entrance Stairs-Rails: None Home Layout: One level     Bathroom Shower/Tub: Chief Strategy Officer: Handicapped height     Home Equipment: Environmental consultant - 2 wheels;Cane - single point;Shower seat   Additional Comments: Pt plans to stay with daughter if able at discharge (daughter works 12 hour shifts outside of home) so 24/7 not available      Prior Functioning/Environment Level of Independence: Independent with assistive device(s)        Comments: indep with AD for all mobility, uses RW or SPC. Reports no recent falls        OT Problem List: Decreased strength;Decreased activity tolerance;Decreased safety awareness;Impaired balance (sitting and/or standing);Decreased coordination;Pain;Decreased knowledge of use of DME or AE;Decreased cognition;Decreased range of motion;Impaired UE functional use;Increased edema      OT Treatment/Interventions: Self-care/ADL training;Therapeutic exercise;Therapeutic activities;Energy conservation;Patient/family education;DME and/or AE instruction;Balance training;Neuromuscular education;Cognitive remediation/compensation    OT Goals(Current goals can be found in the care plan section) Acute Rehab OT Goals Patient Stated Goal: to go home OT Goal Formulation: With patient Time For Goal Achievement: 10/06/19 Potential to Achieve Goals: Good ADL Goals Pt Will Perform Grooming: with mod assist;sitting;with adaptive equipment (c LRAD PRN for improved safety and functional indep.) Pt Will Perform Lower Body Dressing: sitting/lateral leans;with adaptive equipment;with mod assist (c LRAD PRN for improved safety and functional indep.) Pt Will Transfer to Toilet: bedside  commode;squat pivot transfer;with mod assist;with +2 assist (c LRAD PRN for improved safety and functional indep.) Pt Will Perform Toileting - Clothing Manipulation and hygiene: sitting/lateral leans;with mod assist (c LRAD PRN for improved safety and functional indep.)  OT Frequency: Min 2X/week   Barriers to D/C: Inaccessible home environment;Decreased caregiver support          Co-evaluation              AM-PAC OT "6 Clicks" Daily Activity     Outcome Measure Help from another person eating meals?: A Little Help from another person taking care of personal grooming?: A Lot Help from another person toileting, which includes using toliet, bedpan, or urinal?: Total Help from another person bathing (including washing, rinsing, drying)?: A Lot Help from another person to put on and taking off regular upper body clothing?: A Lot Help from another person to put on and taking off regular lower body clothing?: A Lot 6 Click Score: 12   End of Session Equipment Utilized During Treatment: Gait belt Nurse Communication: Other (comment) (Pt has meal tray in room; inquired on NPO status.)  Activity Tolerance: Patient tolerated treatment well Patient left: in bed;with call bell/phone within reach;with bed alarm set;with family/visitor present  OT Visit Diagnosis: Muscle weakness (generalized) (M62.81);Unsteadiness on feet (R26.81);Hemiplegia and hemiparesis;Pain Hemiplegia - Right/Left: Left Hemiplegia - dominant/non-dominant: Non-Dominant Hemiplegia - caused by: Cerebral infarction Pain - Right/Left: Left Pain - part of body: Shoulder                Time: 3612-2449 OT Time Calculation (min): 33 min Charges:  OT General Charges $OT Visit: 1 Visit OT Evaluation $OT Re-eval: 1 Re-eval OT Treatments $Therapeutic Activity: 8-22 mins  Rockney Ghee, M.S., OTR/L Ascom: [phone] 09/22/19, 3:55 PM

## 2019-09-22 NOTE — Evaluation (Signed)
Physical Therapy RE-Evaluation Patient Details Name: [name] MRN: [id number] DOB: 12/11/1933 Today's Date: 09/22/2019   History of Present Illness  [name] is a 86 y.o. female past medical history of diabetes, diabetic polyneuropathy, hyperlipidemia, hypertension presented to the emergency room 09/20/2019 for dizziness and increased urinary frequency. She was admitted and treated for possible UTI, with antibiotics. She was also found to be confused, and her SBP was markedly elevated, which also required treatment.  This was managed with antihypertensives. On 09/22/19 pt was noted to have markedly decreased LLE/LUE functional movement and code stroke was called. F/u imaging shoed Stable left basal ganglia infarct as well as "Area of decreased attenuation in the right half of the pons which may represent an evolving infarct".  Clinical Impression  Re-evaluation performed this date due to change in status. Possible new CVA with stroke work up in progress. Co-evaluation performed with OT. Pt performs bed mobility with mod/max assist and able to sit at EOB for majority of evaluation. Pt fatigues quickly and requires varying levels of assist to maintain seated posture. 2 attempts performed for standing, needing +2 assist due to L hemibody weakness. Pt demonstrates deficits with L UE/LE weakness demonstrating decreased coordination/sensation. Pt seems unaware of deficits and mainly complains of pain, repositioned in bed with pillows for L side. Pt is currently not at baseline level. Would benefit from skilled PT to address above deficits and promote optimal return to PLOF; recommend transition to STR upon discharge from acute hospitalization.     Follow Up Recommendations SNF    Equipment Recommendations   (TBD)    Recommendations for Other Services       Precautions / Restrictions Precautions Precautions: Fall Restrictions Weight Bearing Restrictions: No      Mobility  Bed  Mobility Overal bed mobility: Needs Assistance Bed Mobility: Supine to Sit;Sit to Supine     Supine to sit: Mod assist;+2 for physical assistance Sit to supine: Mod assist;Max assist;+2 for physical assistance   General bed mobility comments: needs physical assist to transition to seated position with close guard once in static sitting. Tends to demonstrates right post leaning, can self correct with cues. Able to sit at EOB for majority of session, however fatigues quickly. Needed +2 assist for return back to supine and positioning in bed.  Transfers Overall transfer level: Needs assistance Equipment used: 2 person hand held assist Transfers: Sit to/from Stand;Lateral/Scoot Transfers Sit to Stand: +2 physical assistance;Max assist        Lateral/Scoot Transfers: Max assist;Mod assist;+2 physical assistance General transfer comment: Needs cues to initiate transfer. Unable to fully stand at bedside. Extreme weakness noted in L hemibody. 2 attempts performed  Ambulation/Gait             General Gait Details: not appropriate at this time  Stairs            Wheelchair Mobility    Modified Rankin (Stroke Patients Only)       Balance Overall balance assessment: Needs assistance Sitting-balance support: Feet supported;Single extremity supported Sitting balance-Leahy Scale: Poor Sitting balance - Comments: Variable level of assist during session with consistent cueing to maintain upright posture. Generally requires MIN A to CGA to maintain static seated balance 2/2 posterior lean. Postural control: Posterior lean Standing balance support: Bilateral upper extremity supported;During functional activity Standing balance-Leahy Scale: Zero Standing balance comment: Unable to maintain standing balance without MAX Aassist  Pertinent Vitals/Pain Pain Assessment: Faces Faces Pain Scale: Hurts little more Pain Location: LUE and LLE  (LUE>LLE) with movement Pain Descriptors / Indicators: Discomfort;Grimacing;Sore Pain Intervention(s): Limited activity within patient's tolerance;Repositioned;Premedicated before session;Monitored during session    Home Living Family/patient expects to be discharged to:: Private residence Living Arrangements: Alone Available Help at Discharge: Family;Available PRN/intermittently Type of Home: House Home Access: Stairs to enter Entrance Stairs-Rails: None Entrance Stairs-Number of Steps: 1 Home Layout: One level Home Equipment: Walker - 2 wheels;Cane - single point;Shower seat Additional Comments: Pt plans to stay with daughter if able at discharge (daughter works 12 hour shifts outside of home) so 24/7 not available    Prior Function Level of Independence: Independent with assistive device(s)         Comments: indep with AD for all mobility, uses RW or SPC. Reports no recent falls     Hand Dominance   Dominant Hand: Right    Extremity/Trunk Assessment   Upper Extremity Assessment Upper Extremity Assessment: Defer to OT evaluation RUE Deficits / Details: AROM limited but WFLs with shoulder elevation ~ 100 degrees LUE Deficits / Details: LUE generally flaccid, with minimal bicep activation noted, pt is able to perform AAROM using RUE occasionally during session. Grip, elbow flex/ext, shoulder flex/abd, and wrist activation minimal grossly 1/5 t/o. Pt c/o pain in LUE with shoulder flexion, LUE notably edematous. LUE Sensation: decreased proprioception (Light touch sensation WFL) LUE Coordination: decreased fine motor;decreased gross motor    Lower Extremity Assessment Lower Extremity Assessment: LLE deficits/detail LLE Deficits / Details: L LE notably weak with hip flexor compensatory movement noted. Needs physical assist for repositioning L LE while seated EOB. LLE Sensation: decreased light touch LLE Coordination: decreased fine motor;decreased gross motor    Cervical /  Trunk Assessment Cervical / Trunk Assessment: Kyphotic  Communication   Communication: No difficulties  Cognition Arousal/Alertness: Awake/alert Behavior During Therapy: WFL for tasks assessed/performed Overall Cognitive Status: Impaired/Different from baseline Area of Impairment: Awareness;Safety/judgement;Following commands                       Following Commands: Follows one step commands with increased time Safety/Judgement: Decreased awareness of deficits;Decreased awareness of safety Awareness: Emergent   General Comments: Pt oriented to self, place, and date. Initially does not recognize daughter when she enters the room, but does consistently identify her after min cueing from daughter. Decreased awareness of deficits.      General Comments      Exercises Other Exercises Other Exercises: Pt/caregiver educated on strategies to support LUE functional use and minimize edema. OT facilitates positioning of LUE on pillows to support joint stability, maximize safety, skin integrity, and functional use. Pt also educated on AAROM however education limited 2/2 pt cognitive status/decreased motor planning would benefit from further review. Other Exercises: OT/PT facilitate bed/functional mobility attempts this date. See above for detail. Other Exercises: Attempted ther-ex with AAROM of L UE. Pt unable to perform motor planning for completion. Also attempted to perform supine bridging for repositioning in bed, R LE activiates, however needs max assist on L LE. Was able to perform seated LAQ with compensatory strategy at EOB x 5 reps.   Assessment/Plan    PT Assessment Patient needs continued PT services  PT Problem List Decreased strength;Decreased balance;Decreased mobility;Decreased coordination;Decreased cognition;Decreased safety awareness;Impaired sensation;Pain       PT Treatment Interventions DME instruction;Gait training;Stair training;Therapeutic activities;Therapeutic  exercise;Balance training    PT Goals (Current goals can be found in  the Care Plan section)  Acute Rehab PT Goals Patient Stated Goal: to go home PT Goal Formulation: With patient Time For Goal Achievement: 10/06/19 Potential to Achieve Goals: Good    Frequency 7X/week   Barriers to discharge        Co-evaluation PT/OT/SLP Co-Evaluation/Treatment: Yes Reason for Co-Treatment: Complexity of the patient's impairments (multi-system involvement);For patient/therapist safety;To address functional/ADL transfers PT goals addressed during session: Mobility/safety with mobility;Balance;Strengthening/ROM         AM-PAC PT "6 Clicks" Mobility  Outcome Measure Help needed turning from your back to your side while in a flat bed without using bedrails?: A Lot Help needed moving from lying on your back to sitting on the side of a flat bed without using bedrails?: A Lot Help needed moving to and from a bed to a chair (including a wheelchair)?: Total Help needed standing up from a chair using your arms (e.g., wheelchair or bedside chair)?: Total Help needed to walk in hospital room?: Total Help needed climbing 3-5 steps with a railing? : Total 6 Click Score: 8    End of Session Equipment Utilized During Treatment: Gait belt Activity Tolerance: Patient tolerated treatment well Patient left: in bed;with bed alarm set;with family/visitor present Nurse Communication: Mobility status PT Visit Diagnosis: Unsteadiness on feet (R26.81);Muscle weakness (generalized) (M62.81);Difficulty in walking, not elsewhere classified (R26.2);History of falling (Z91.81);Hemiplegia and hemiparesis;Pain Hemiplegia - Right/Left: Left Hemiplegia - dominant/non-dominant: Non-dominant Hemiplegia - caused by: Cerebral infarction Pain - Right/Left: Left Pain - part of body: Shoulder    Time: 5465-0354 PT Time Calculation (min) (ACUTE ONLY): 33 min   Charges:   PT Evaluation $PT Re-evaluation: 1 Re-eval PT  Treatments $Therapeutic Activity: 8-22 mins        Elizabeth Palau, PT, DPT [phone]   Knolan Simien 09/22/2019, 4:03 PM

## 2019-09-22 NOTE — Plan of Care (Signed)
Called back family again.  Was able to reach the son. He told me that she did not have any focal left-sided weakness prior to this hospitalization. In 2012 she had a UTI for which she required admission when she was told that she might of had a stroke at some point based on her CT scan. He could not tell me her last known well as well but had to be sometime a day or 2 ago.  Stroke work-up as recommended earlier  I will follow.  -- Milon Dikes, MD [facility] Pager: [phone] If 7pm to 7am, please call on call as listed on [facility]ION.

## 2019-09-23 ENCOUNTER — Inpatient Hospital Stay: Payer: Medicare HMO

## 2019-09-23 DIAGNOSIS — N3001 Acute cystitis with hematuria: Secondary | ICD-10-CM | POA: Diagnosis not present

## 2019-09-23 DIAGNOSIS — I639 Cerebral infarction, unspecified: Secondary | ICD-10-CM | POA: Diagnosis not present

## 2019-09-23 LAB — BASIC METABOLIC PANEL
Anion gap: 11 (ref 5–15)
BUN: 13 mg/dL (ref 8–23)
CO2: 27 mmol/L (ref 22–32)
Calcium: 8.8 mg/dL — ABNORMAL LOW (ref 8.9–10.3)
Chloride: 100 mmol/L (ref 98–111)
Creatinine, Ser: 0.62 mg/dL (ref 0.44–1.00)
GFR calc Af Amer: >60 mL/min (ref >60)
GFR calc non Af Amer: >60 mL/min (ref >60)
Glucose, Bld: 146 mg/dL — ABNORMAL HIGH (ref 70–99)
Potassium: 3.4 mmol/L — ABNORMAL LOW (ref 3.5–5.1)
Sodium: 138 mmol/L (ref 135–145)

## 2019-09-23 LAB — LIPID PANEL
Cholesterol: 150 mg/dL (ref 0–200)
HDL: 45 mg/dL (ref >40)
LDL Cholesterol: 86 mg/dL (ref 0–99)
Total CHOL/HDL Ratio: 3.3 RATIO
Triglycerides: 94 mg/dL (ref <150)
VLDL: 19 mg/dL (ref 0–40)

## 2019-09-23 LAB — CBC
HCT: 34.3 % — ABNORMAL LOW (ref 36.0–46.0)
Hemoglobin: 11.4 g/dL — ABNORMAL LOW (ref 12.0–15.0)
MCH: 31.2 pg (ref 26.0–34.0)
MCHC: 33.2 g/dL (ref 30.0–36.0)
MCV: 94 fL (ref 80.0–100.0)
Platelets: 196 10*3/uL (ref 150–400)
RBC: 3.65 MIL/uL — ABNORMAL LOW (ref 3.87–5.11)
RDW: 11.9 % (ref 11.5–15.5)
WBC: 9.3 10*3/uL (ref 4.0–10.5)
nRBC: 0 % (ref 0.0–0.2)

## 2019-09-23 LAB — GLUCOSE, CAPILLARY
Glucose-Capillary: 135 mg/dL — ABNORMAL HIGH (ref 70–99)
Glucose-Capillary: 186 mg/dL — ABNORMAL HIGH (ref 70–99)
Glucose-Capillary: 176 mg/dL — ABNORMAL HIGH (ref 70–99)
Glucose-Capillary: 192 mg/dL — ABNORMAL HIGH (ref 70–99)

## 2019-09-23 LAB — HEMOGLOBIN A1C
Hgb A1c MFr Bld: 7.8 % — ABNORMAL HIGH (ref 4.8–5.6)
Mean Plasma Glucose: 177.16 mg/dL

## 2019-09-23 MED ORDER — ATORVASTATIN CALCIUM 20 MG PO TABS
80.0000 mg | ORAL_TABLET | Freq: Every day | ORAL | Status: DC
  Administered 2019-09-23 – 2019-09-27 (×5): 80 mg via ORAL
  Filled 2019-09-23 (×5): qty 4

## 2019-09-23 MED ORDER — CLOPIDOGREL BISULFATE 75 MG PO TABS
75.0000 mg | ORAL_TABLET | Freq: Every day | ORAL | Status: DC
  Administered 2019-09-23 – 2019-09-27 (×5): 75 mg via ORAL
  Filled 2019-09-23 (×5): qty 1

## 2019-09-23 MED ORDER — POTASSIUM CHLORIDE CRYS ER 20 MEQ PO TBCR
40.0000 meq | EXTENDED_RELEASE_TABLET | Freq: Once | ORAL | Status: AC
  Administered 2019-09-23: 40 meq via ORAL
  Filled 2019-09-23: qty 2

## 2019-09-23 MED ORDER — HYDRALAZINE HCL 50 MG PO TABS
25.0000 mg | ORAL_TABLET | Freq: Three times a day (TID) | ORAL | Status: DC | PRN
  Administered 2019-09-26 (×2): 25 mg via ORAL
  Filled 2019-09-23 (×2): qty 1

## 2019-09-23 MED ORDER — SODIUM CHLORIDE 0.9 % IV SOLN
1.0000 g | INTRAVENOUS | Status: AC
  Administered 2019-09-24: 1 g via INTRAVENOUS
  Filled 2019-09-23: qty 10

## 2019-09-23 NOTE — [place] FL2 (Signed)
[place] MEDICAID FL2 LEVEL OF CARE SCREENING TOOL     IDENTIFICATION  Patient Name: [name] Birthdate: Feb 18, 1933 Sex: female Admission Date (Current Location): 09/20/2019  [place] and [place] Number:  Chiropodist and Address:  [facility], [address] [place], [place] [address]      Provider Number: [id number]  Attending Physician Name and Address:  Alba Cory, MD  Relative Name and Phone Number:  Horton Finer - [phone]    Current Level of Care: Hospital Recommended Level of Care: Skilled Nursing Facility Prior Approval Number:    Date Approved/Denied:   PASRR Number: [id number]  Discharge Plan: SNF    Current Diagnoses: Patient Active Problem List   Diagnosis Date Noted  . UTI (urinary tract infection) 09/20/2019  . Sepsis (HCC) 03/22/2018  . Cystocele, midline 09/04/2017  . Incomplete uterine prolapse 09/04/2017  . Malnutrition of moderate degree 01/15/2016  . Dehydration   . Incarcerated ventral hernia   . Diabetic ketoacidosis without coma associated with type 2 diabetes mellitus (HCC)   . Type 2 diabetes mellitus with polyneuropathy (HCC) 08/06/2015  . Carpal tunnel syndrome, bilateral 07/10/2015  . Osteopenia of multiple sites 07/10/2015  . Arthralgia of multiple sites 06/27/2015  . Mixed hyperlipidemia 11/06/2014  . Hypomagnesemia 11/02/2013  . Essential hypertension 11/02/2013    Orientation RESPIRATION BLADDER Height & Weight     Self, Time, Situation, Place  Normal Continent Weight: 76.2 kg Height:  5' (152.4 cm)  BEHAVIORAL SYMPTOMS/MOOD NEUROLOGICAL BOWEL NUTRITION STATUS      Continent Diet (see discharge summary)  AMBULATORY STATUS COMMUNICATION OF NEEDS Skin   Extensive Assist Verbally Normal                       Personal Care Assistance Level of Assistance  Bathing, Feeding, Dressing Bathing Assistance: Maximum assistance Feeding assistance: Limited  assistance Dressing Assistance: Maximum assistance     Functional Limitations Info             SPECIAL CARE FACTORS FREQUENCY  PT (By licensed PT), OT (By licensed OT)     PT Frequency: 5 times per week OT Frequency: 5 times per week            Contractures Contractures Info: Not present    Additional Factors Info  Code Status, Allergies Code Status Info: Full Allergies Info: NKA           Current Medications (09/23/2019):  This is the current hospital active medication list Current Facility-Administered Medications  Medication Dose Route Frequency Provider Last Rate Last Admin  . 0.9 %  sodium chloride infusion   Intravenous Continuous Regalado, Belkys A, MD 100 mL/hr at 09/22/19 2227 New Bag at 09/22/19 2227  . acetaminophen (TYLENOL) tablet 650 mg  650 mg Oral Q6H PRN Briant Cedar, MD       Or  . acetaminophen (TYLENOL) suppository 650 mg  650 mg Rectal Q6H PRN Briant Cedar, MD      . albuterol (PROVENTIL) (2.5 MG/3ML) 0.083% nebulizer solution 2.5 mg  2.5 mg Nebulization Q2H PRN Briant Cedar, MD      . aspirin EC tablet 325 mg  325 mg Oral Daily Regalado, Belkys A, MD   325 mg at 09/23/19 0859  . atorvastatin (LIPITOR) tablet 80 mg  80 mg Oral Daily Milon Dikes, MD      . cefTRIAXone (ROCEPHIN) 1 g in sodium chloride 0.9 % 100 mL IVPB  1 g Intravenous Q24H Briant Cedar, MD 200 mL/hr at 09/23/19 0554 1 g at 09/23/19 0554  . clopidogrel (PLAVIX) tablet 75 mg  75 mg Oral Daily Milon Dikes, MD      . enoxaparin (LOVENOX) injection 40 mg  40 mg Subcutaneous Q24H Briant Cedar, MD   40 mg at 09/23/19 1218  . hydrALAZINE (APRESOLINE) tablet 25 mg  25 mg Oral Q8H PRN Regalado, Belkys A, MD      . HYDROcodone-acetaminophen (NORCO/VICODIN) 5-325 MG per tablet 1 tablet  1 tablet Oral Q6H PRN Briant Cedar, MD   1 tablet at 09/22/19 0829  . insulin aspart (novoLOG) injection 0-5 Units  0-5 Units Subcutaneous QHS Briant Cedar, MD      . insulin aspart (novoLOG) injection 0-9 Units  0-9 Units Subcutaneous TID WC Briant Cedar, MD   2 Units at 09/23/19 1219  . ondansetron (ZOFRAN) tablet 4 mg  4 mg Oral Q6H PRN Briant Cedar, MD       Or  . ondansetron [facility]) injection 4 mg  4 mg Intravenous Q6H PRN Briant Cedar, MD      . polyethylene glycol (MIRALAX / GLYCOLAX) packet 17 g  17 g Oral Daily PRN Briant Cedar, MD      . vitamin B-12 (CYANOCOBALAMIN) tablet 100 mcg  100 mcg Oral Daily Regalado, Belkys A, MD   100 mcg at 09/23/19 1638     Discharge Medications: Please see discharge summary for a list of discharge medications.  Relevant Imaging Results:  Relevant Lab Results:   Additional Information SS# [id number]  Allayne Butcher, RN

## 2019-09-23 NOTE — TOC Progression Note (Signed)
Transition of Care [facility]) - Progression Note    Patient Details  Name: [name] MRN: [id number] Date of Birth: 05-Nov-1933  Transition of Care [facility]) CM/SW Contact  Allayne Butcher, RN Phone Number: 09/23/2019, 12:38 PM  Clinical Narrative:     Bed search started for SNF, daughter is considering skilled now.  Expected Discharge Plan: Home w Home Health Services Barriers to Discharge: Continued Medical Work up  Expected Discharge Plan and Services Expected Discharge Plan: Home w Home Health Services   Discharge Planning Services: CM Consult Post Acute Care Choice: Home Health Living arrangements for the past 2 months: Single Family Home                           HH Arranged: PT, OT HH Agency: [facility] Date HH Agency Contacted: 09/21/19 Time HH Agency Contacted: 1050 Representative spoke with at [facility] Agency: Mellissa Kohut   Social Determinants of Health (SDOH) Interventions    Readmission Risk Interventions No flowsheet data found.

## 2019-09-23 NOTE — Progress Notes (Signed)
Physical Therapy Treatment Patient Details Name: [name] MRN: [id number] DOB: Apr 08, 1933 Today's Date: 09/23/2019    History of Present Illness [name] is a 86 y.o. female past medical history of diabetes, diabetic polyneuropathy, hyperlipidemia, hypertension presented to the emergency room 09/20/2019 for dizziness and increased urinary frequency. She was admitted and treated for possible UTI, with antibiotics. She was also found to be confused, and her SBP was markedly elevated, which also required treatment.  This was managed with antihypertensives. On 09/22/19 pt was noted to have markedly decreased LLE/LUE functional movement and code stroke was called. F/u imaging shoed Stable left basal ganglia infarct as well as "Area of decreased attenuation in the right half of the pons which may represent an evolving infarct".    PT Comments    Pt lying in bed with daughter at bedside upon arrival to room. Pt agreeable to PT session. Pt reported feeling wet and purewick positioning had shifted and absorbant pad was wet. Pt required max A for rolling to both L and R sides for LUE/LLE positioning, verbal cues for sequencing and for coming into sidelying position. Pt required heavy mod-max A for supine to sit for LUE/LLE management, trunk elevation, and pivoting hips to sit squarely with EOB. Pt required varying amounts of assistance with sitting EOB as she occasionally has posterior and L-sided LOB. Attempted to have pt perform shoulder elevation with only R unilateral movement noted. Pt instructed to make visual contact with extremity prior to movement to assist with making a connection to movement. Pt with slight movements noted in LLE dorsiflexion, hip flexion, and hip extension by pushing on clinician's hand. While EOB, verbal cues to maintain COG within BOS and able to bring chest forward to clinician's hand to improve sitting balance. Attempted to place LUE in dependent position for  weightbearing through for NMR however limited by L shoulder pain. Pt performed sit <> stand with max+2 A for boosting hips to stand, L knee blocking, truncal support, and eccentric control on descent. Pt with difficulty extending hips, trunk, and cervical region therefore unable to obtain full upright stance despite verbal cues. Pt reported dizziness with standing and fatigued and sat quickly to the bed. Pt returned to supine with max+2 A for trunk and BLE control. Able to assist slightly with scooting up in bed by reaching for headboard with RUE and manually placing LLE and pushing through BLE with verbal cues and max+2 A via drawsheet. Pt left with LUE in supported position via pillows to prevent subluxation. Pt could benefit from STR prior to discharge home with daughter therefore continue to recommend SNF at this time.    Follow Up Recommendations  SNF     Equipment Recommendations  None recommended by PT    Recommendations for Other Services       Precautions / Restrictions Precautions Precautions: Fall Precaution Comments: be protective of L shoulder to prevent subluxation Restrictions Weight Bearing Restrictions: No    Mobility  Bed Mobility Overal bed mobility: Needs Assistance Bed Mobility: Supine to Sit;Sit to Supine;Rolling     Supine to sit: Max assist Sit to supine: Max assist;+2 for physical assistance   General bed mobility comments: max A for rolling side to side for wet pad change; verbal cues for hand and foot placement with max A for positioning of LUE/LLE; mod/max A for supine to sit for LUE/LLE managment, trunk elevation, and for rotating hips to sit EOB; sit to supine with max+2 A for trunk/BLE control  onto bed  Transfers Overall transfer level: Needs assistance Equipment used: 2 person hand held assist Transfers: Sit to/from Stand Sit to Stand: +2 physical assistance;Max assist         General transfer comment: max+2 A for sit to stand for boosting to  stand, blocking L knee, and trunk control while upright; pt unable to come into full upright stance despite verbal cues as hips, trunk, and neck remained slightly flexed  Ambulation/Gait             General Gait Details: not safe to attempt at this time   Stairs             Wheelchair Mobility    Modified Rankin (Stroke Patients Only)       Balance Overall balance assessment: Needs assistance Sitting-balance support: Feet supported;Single extremity supported Sitting balance-Leahy Scale: Poor Sitting balance - Comments: pt required assist from CGA to mod A for sitting balance and keeping COG within BOS; verbal cues for postural control and pt noted to lean to L and posteriorly Postural control: Posterior lean Standing balance support: Bilateral upper extremity supported;During functional activity Standing balance-Leahy Scale: Zero Standing balance comment: Unable to maintain standing balance without MAX +2 assist                            Cognition Arousal/Alertness: Awake/alert Behavior During Therapy: WFL for tasks assessed/performed Overall Cognitive Status: Impaired/Different from baseline Area of Impairment: Awareness;Safety/judgement;Following commands                       Following Commands: Follows one step commands with increased time Safety/Judgement: Decreased awareness of deficits;Decreased awareness of safety            Exercises Other Exercises Other Exercises: attempted LUE and LLE therex including L hand grasping, shoulder shrug, hip flexion, hip/knee extension, dorsi and plantarflexion; increased cues required for attention to LUE/LLE    General Comments        Pertinent Vitals/Pain Pain Assessment: Faces Faces Pain Scale: Hurts even more Pain Location: LUE which increases with movement Pain Descriptors / Indicators: Discomfort;Grimacing;Sore Pain Intervention(s): Monitored during session;Limited activity within  patient's tolerance    Home Living                      Prior Function            PT Goals (current goals can now be found in the care plan section) Acute Rehab PT Goals Patient Stated Goal: to go home PT Goal Formulation: With patient Time For Goal Achievement: 10/06/19 Potential to Achieve Goals: Good Progress towards PT goals: Progressing toward goals    Frequency    7X/week      PT Plan Current plan remains appropriate    Co-evaluation     PT goals addressed during session: Mobility/safety with mobility;Balance;Strengthening/ROM        AM-PAC PT "6 Clicks" Mobility   Outcome Measure  Help needed turning from your back to your side while in a flat bed without using bedrails?: A Lot Help needed moving from lying on your back to sitting on the side of a flat bed without using bedrails?: A Lot Help needed moving to and from a bed to a chair (including a wheelchair)?: Total Help needed standing up from a chair using your arms (e.g., wheelchair or bedside chair)?: Total Help needed to walk in hospital room?: Total Help  needed climbing 3-5 steps with a railing? : Total 6 Click Score: 8    End of Session Equipment Utilized During Treatment: Gait belt Activity Tolerance: Patient limited by fatigue Patient left: in bed;with call bell/phone within reach;with bed alarm set (family member present stepped out into hall for phone call) Nurse Communication: Mobility status PT Visit Diagnosis: Unsteadiness on feet (R26.81);Muscle weakness (generalized) (M62.81);Difficulty in walking, not elsewhere classified (R26.2);History of falling (Z91.81);Hemiplegia and hemiparesis;Pain Hemiplegia - Right/Left: Left Hemiplegia - dominant/non-dominant: Non-dominant Hemiplegia - caused by: Cerebral infarction Pain - Right/Left: Left Pain - part of body: Shoulder     Time: 1127-1208 PT Time Calculation (min) (ACUTE ONLY): 41 min  Charges:  $Therapeutic Exercise: 23-37  mins $Therapeutic Activity: 8-22 mins                     Frederich Chick, SPT   Dajon Rowe 09/23/2019, 2:08 PM

## 2019-09-23 NOTE — TOC Progression Note (Signed)
Transition of Care [facility]) - Progression Note    Patient Details  Name: [name] MRN: [id number] Date of Birth: 1933/11/07  Transition of Care [facility]) CM/SW Contact  Allayne Butcher, RN Phone Number: 09/23/2019, 12:08 PM  Clinical Narrative:    Patient's daughter, Shaune Pascal, is at the bedside this morning.  RNCM introduced self and explained role.  RNCM discussed PT recommendations for SNF, patient says that if she has to go to SNF she will.  Camilla asked if the patient can go home with her, she and the patient would both like this and to have home health come out.  RNCM will arrange for 3 in1 to be delivered and get [facility] for home health RN, PT, OT, aide, and SW.  Patient's daughter lives at [address] [place] [address].   Rosey Bath with [facility] is aware that patient will discharge to daughter's home.     Expected Discharge Plan: Home w Home Health Services Barriers to Discharge: Continued Medical Work up  Expected Discharge Plan and Services Expected Discharge Plan: Home w Home Health Services   Discharge Planning Services: CM Consult Post Acute Care Choice: Home Health Living arrangements for the past 2 months: Single Family Home                           HH Arranged: PT, OT HH Agency: [facility] Date HH Agency Contacted: 09/21/19 Time HH Agency Contacted: 1050 Representative spoke with at [facility] Agency: Mellissa Kohut   Social Determinants of Health (SDOH) Interventions    Readmission Risk Interventions No flowsheet data found.

## 2019-09-23 NOTE — Progress Notes (Signed)
Neurology Progress Note   S:// Seen and examined. Not able to move left arm at all. Left leg - some movement on foot with plantar and dorsiflexion. Therapy recs SNF   O:// Current vital signs: BP (!) 115/46 (BP Location: Left Arm)   Pulse 90   Temp 98.5 F (36.9 C)   Resp 16   Ht 5' (1.524 m)   Wt 76.2 kg   SpO2 98%   BMI 32.81 kg/m  Vital signs in last 24 hours: Temp:  [98.1 F (36.7 C)-98.9 F (37.2 C)] 98.5 F (36.9 C) (08/13 0731) Pulse Rate:  [63-90] 90 (08/13 0731) Resp:  [16-17] 16 (08/13 0731) BP: (111-164)/(46-83) 115/46 (08/13 0731) SpO2:  [96 %-98 %] 98 % (08/13 0731) General: Awake alert in no distress HEENT: Normocephalic atraumatic Lungs: Clear Cardiovascular: Regular rhythm Abdomen soft nondistended nontender Extremities warm well perfused Neurological exam Awake alert oriented to place and self.  Could not tell me the date. Speech is mildly dysarthric No evidence of aphasia Cranial nerves: Pupils equal round react light, extraocular movements intact, visual fields appear full, facial sensation appears intact, face -flattened left nasolabial fold.  Auditory acuity is mildly reduced bilaterally.  Tongue and palate midline. Motor exam: Left upper extremity is barely at best 1/5.  Left lower extremity also is weak and is a 1-2/5 proximal and 2/5 distally on foot.  Right upper and lower extremity are full strength. Sensory exam: Intact to touch all over Coordination: No dysmetria on the right.  Unable to perform on the left Medications  Current Facility-Administered Medications:  .  0.9 %  sodium chloride infusion, , Intravenous, Continuous, Regalado, Belkys A, MD, Last Rate: 100 mL/hr at 09/22/19 2227, New Bag at 09/22/19 2227 .  acetaminophen (TYLENOL) tablet 650 mg, 650 mg, Oral, Q6H PRN **OR** acetaminophen (TYLENOL) suppository 650 mg, 650 mg, Rectal, Q6H PRN, Briant Cedar, MD .  albuterol (PROVENTIL) (2.5 MG/3ML) 0.083% nebulizer solution 2.5  mg, 2.5 mg, Nebulization, Q2H PRN, Briant Cedar, MD .  aspirin EC tablet 325 mg, 325 mg, Oral, Daily, Regalado, Belkys A, MD, 325 mg at 09/23/19 0859 .  cefTRIAXone (ROCEPHIN) 1 g in sodium chloride 0.9 % 100 mL IVPB, 1 g, Intravenous, Q24H, Briant Cedar, MD, Last Rate: 200 mL/hr at 09/23/19 0554, 1 g at 09/23/19 0554 .  enoxaparin (LOVENOX) injection 40 mg, 40 mg, Subcutaneous, Q24H, Sharolyn Douglas, Monica Martinez, MD, 40 mg at 09/22/19 1234 .  hydrALAZINE (APRESOLINE) tablet 25 mg, 25 mg, Oral, Q8H PRN, Regalado, Belkys A, MD .  HYDROcodone-acetaminophen (NORCO/VICODIN) 5-325 MG per tablet 1 tablet, 1 tablet, Oral, Q6H PRN, Briant Cedar, MD, 1 tablet at 09/22/19 0829 .  insulin aspart (novoLOG) injection 0-5 Units, 0-5 Units, Subcutaneous, QHS, [place], Monica Martinez, MD .  insulin aspart (novoLOG) injection 0-9 Units, 0-9 Units, Subcutaneous, TID WC, Briant Cedar, MD, 1 Units at 09/23/19 0859 .  ondansetron (ZOFRAN) tablet 4 mg, 4 mg, Oral, Q6H PRN **OR** ondansetron (ZOFRAN) injection 4 mg, 4 mg, Intravenous, Q6H PRN, Briant Cedar, MD .  polyethylene glycol (MIRALAX / GLYCOLAX) packet 17 g, 17 g, Oral, Daily PRN, Briant Cedar, MD .  simvastatin (ZOCOR) tablet 40 mg, 40 mg, Oral, QHS, Briant Cedar, MD, 40 mg at 09/22/19 2229 .  vitamin B-12 (CYANOCOBALAMIN) tablet 100 mcg, 100 mcg, Oral, Daily, Regalado, Belkys A, MD, 100 mcg at 09/23/19 0859 Labs CBC    Component Value Date/Time   WBC 9.3 09/23/2019 0455  RBC 3.65 (L) 09/23/2019 0455   HGB 11.4 (L) 09/23/2019 0455   HGB 8.6 (L) 09/25/2013 0410   HCT 34.3 (L) 09/23/2019 0455   HCT 27.0 (L) 09/24/2013 0409   PLT 196 09/23/2019 0455   PLT 192 09/24/2013 0409   MCV 94.0 09/23/2019 0455   MCV 96 09/24/2013 0409   MCH 31.2 09/23/2019 0455   MCHC 33.2 09/23/2019 0455   RDW 11.9 09/23/2019 0455   RDW 13.8 09/24/2013 0409   LYMPHSABS 1.0 03/22/2018 1755   LYMPHSABS 2.5 09/24/2013 0409   MONOABS  1.1 (H) 03/22/2018 1755   MONOABS 0.7 09/24/2013 0409   EOSABS 0.0 03/22/2018 1755   EOSABS 0.4 09/24/2013 0409   BASOSABS 0.0 03/22/2018 1755   BASOSABS 0.0 09/24/2013 0409    CMP     Component Value Date/Time   NA 138 09/23/2019 0455   NA 143 09/24/2013 0409   K 3.4 (L) 09/23/2019 0455   K 3.6 09/24/2013 0409   CL 100 09/23/2019 0455   CL 108 (H) 09/24/2013 0409   CO2 27 09/23/2019 0455   CO2 27 09/24/2013 0409   GLUCOSE 146 (H) 09/23/2019 0455   GLUCOSE 222 (H) 09/24/2013 0409   BUN 13 09/23/2019 0455   BUN 7 09/24/2013 0409   CREATININE 0.62 09/23/2019 0455   CREATININE 0.77 09/24/2013 0409   CALCIUM 8.8 (L) 09/23/2019 0455   CALCIUM 7.7 (L) 09/24/2013 0409   PROT 7.4 03/22/2018 1755   PROT 5.4 (L) 09/22/2013 0507   ALBUMIN 3.5 03/22/2018 1755   ALBUMIN 2.3 (L) 09/22/2013 0507   AST 37 03/22/2018 1755   AST 19 09/22/2013 0507   ALT 20 03/22/2018 1755   ALT 13 (L) 09/22/2013 0507   ALKPHOS 85 03/22/2018 1755   ALKPHOS 75 09/22/2013 0507   BILITOT 0.9 03/22/2018 1755   BILITOT 0.3 09/22/2013 0507   GFRNONAA >60 09/23/2019 0455   GFRNONAA >60 09/24/2013 0409   GFRAA >60 09/23/2019 0455   GFRAA >60 09/24/2013 0409   A1c - 8.1 LDL - 86 2D Echocardiogram -LVEF 55-60% -LA size - N -IAS - none  Imaging I have reviewed images in epic and the results pertinent to this consultation are: CTH with possible right pontine hypodensity Repeat CTH pending CTA head/neck: IMPRESSION: 1. No acute large or medium vessel occlusion. 2. Atherosclerotic disease at both carotid bifurcations but without ICA stenosis. Severe bilateral external carotid artery stenoses. 3. Moderate to severe stenosis of the left MCA M1 segment. 4. Moderate stenosis of both posterior cerebral arteries at the P1 P2 junction regions. 5. Stenosis of the distal right vertebral artery just proximal to the basilar artery.  Aortic Atherosclerosis (ICD10-I70.0).  Assessment:  86 year old woman  admitted to the hospital for treatment of UTI as well as possible hypertensive urgency, noted to have left-sided weakness. She is unable to reliably tell us the last normal.  She is definitely outside the window for IV TPA but probably also outside the window for any intervention as well as exam not consistent with an LVO. CTH with pontine hypodensity on the right - likely small vessel stroke. Vessel imaging with ICAD and b/l carotid atherosclerosis without ICA stenosis.  Impression: Acute ischemic stroke - small vessel etiology ICAD HTN DM HLD  Recommendations: -Repeat CTH stable with no evidence of hemorrhagic transformation. -Due to ICAD on CTA head and neck - will recommend DAPT for 3 months in accordance with SAMMPRIS trial: ASA 325 + Plavix 75 for 3 months followed by Plavix only  after completing 3 months of DAPT. -High dose statin - Atorvastatin 80 mg po daily for goal LDL <70. -A1c is also high - need optimization with goal less than 7. -Therapy recs SNF -Treatment of UTI per primary team  Follow up with stroke clinic in 4-8 weeks after discharge.  Discussed my plan with the primary hospitalist Dr Sunnie Nielsen.  -- Milon Dikes, MD [facility] Pager: [phone] If 7pm to 7am, please call on call as listed on AMION.

## 2019-09-23 NOTE — TOC Progression Note (Signed)
Transition of Care [facility]) - Progression Note    Patient Details  Name: [name] MRN: [id number] Date of Birth: Mar 12, 1933  Transition of Care [facility]) CM/SW Contact  Allayne Butcher, RN Phone Number: 09/23/2019, 2:45 PM  Clinical Narrative:    Patient's health plan is not managed by [facility].  Verlon Au at [employer] will start insurance authorization.    Expected Discharge Plan: Home w Home Health Services Barriers to Discharge: Continued Medical Work up  Expected Discharge Plan and Services Expected Discharge Plan: Home w Home Health Services   Discharge Planning Services: CM Consult Post Acute Care Choice: Home Health Living arrangements for the past 2 months: Single Family Home                           HH Arranged: PT, OT HH Agency: [facility] Date HH Agency Contacted: 09/21/19 Time HH Agency Contacted: 1050 Representative spoke with at [facility] Agency: Mellissa Kohut   Social Determinants of Health (SDOH) Interventions    Readmission Risk Interventions No flowsheet data found.

## 2019-09-23 NOTE — TOC Progression Note (Signed)
Transition of Care [facility]) - Progression Note    Patient Details  Name: MARTHANN ABSHIER MRN: [id number] Date of Birth: [birth year]  Transition of Care [facility]) CM/SW Contact  Allayne Butcher, RN Phone Number: 09/23/2019, 2:30 PM  Clinical Narrative:    Patient and family decided on SNF and would like [facility].  Patient and family are vaccinated and [facility] allows visitors 2 at a time.  Patient will be able to discharge to [facility] on Monday.     Expected Discharge Plan: Home w Home Health Services Barriers to Discharge: Continued Medical Work up  Expected Discharge Plan and Services Expected Discharge Plan: Home w Home Health Services   Discharge Planning Services: CM Consult Post Acute Care Choice: Home Health Living arrangements for the past 2 months: Single Family Home                           HH Arranged: PT, OT HH Agency: [facility] Date HH Agency Contacted: 09/21/19 Time HH Agency Contacted: [phone] Representative spoke with at [facility] Agency: Mellissa Kohut   Social Determinants of Health (SDOH) Interventions    Readmission Risk Interventions No flowsheet data found.

## 2019-09-23 NOTE — Progress Notes (Addendum)
PROGRESS NOTE    [name]  AVW:[id number] DOB: [birth year] DOA: 09/20/2019 PCP: Gracelyn Nurse, MD   Brief Narrative: 86 year old past medical history significant for hypertension, hyperlipidemia, diverticulitis, diabetes who presents with acute metabolic encephalopathy, unsteady gait, frequent urination and right thigh discomfort for the last few days.  Patient presented with hypertensive crisis, UA positive for UTI, Kovic 19 test negative.  CT head negative for acute intracranial abnormality.  Chest x-ray; no acute cardiopulmonary disease  Assessment & Plan:   Principal Problem:   UTI (urinary tract infection) Active Problems:   Type 2 diabetes mellitus with polyneuropathy (HCC)   Mixed hyperlipidemia   Essential hypertension   1-Left side weakness; Acute pontine stroke. Small vessel diseases.  Patient complained of left arm and leg pain. She was not able to move left side, due to pain and weakness. Patient reported she wake up with this symptoms, left side pain and weakness. I discussed with patient's Daughter; patient was able to move left side prior to admission. I consulted Neurology. Plan was for activation of Code stroke.  -Not TPA candidate, unknown time of symptoms.  -CT head ordered stat: Chronic white matter ischemic change. Stable left basal ganglia Infarct. Area of decreased attenuation in the right half of the pons which may represent an evolving infarct. Follow-up imaging may be helpful. -Aspirin increase to 325 mg. Continue with statins.  -Stroke order set implemented.  -CTA; No acute large or medium vessel occlusion. Atherosclerotic disease at both carotid bifurcations but without ICA stenosis. Severe bilateral external carotid artery stenoses.Moderate to severe stenosis of the left MCA M1 segment. Moderate stenosis of both posterior cerebral arteries at the P1 P2 junction regions. Stenosis of the distal right vertebral artery just proximal to the basilar  artery. -permissive HTN.  She will need rehab.  Plan for aspirin 325 mg plus plavix for three months then plavix alone.  Repeated ct head stable.   Acute metabolic encephalopathy likely secondary to UTI Patient is alert and oriented. UA with 21-50 white blood cells. Treated with  IV ceftriaxone. Plan for 3 days of IV antibiotics.  CT head negative for acute abnormality. Unable to perform MRI due to foreign body eye  Urine culture multiple species present  Hypertensive crisis, uncontrolled hypertension: Chest x-ray negative. Doppler lower extremity negative for DVT Permissive HTN due to stroke, hold lisinopril and HCTZ Echo; normal ef.   Dizziness/unsteady gait: CT negative. Patient unable to get MRI Check B12 low normal; 227. Started supplement.  PT eval  Diabetes type 2 Continue with a sliding scale insulin  Hyperlipidemia continue with statins Hypokalemia; replete orally.   Estimated body mass index is 32.81 kg/m as calculated from the following:   Height as of this encounter: 5' (1.524 m).   Weight as of this encounter: 76.2 kg.   DVT prophylaxis: Lovenox Code Status: Full code Family Communication: Daughter over phone.  Disposition Plan:  Status is: inpatient.    Dispo: The patient is from: Home              Anticipated d/c is to: SNF              Anticipated d/c date is: 2 days              Patient currently is not medically stable to d/c.  Patient with left side weakness, likely stroke. Needs SNF       Consultants:   None  Procedures:   Echo  Antimicrobials:  Ceftriaxone  Subjective: Patient report feeling the same.  She report persistent left side weakness  Objective: Vitals:   09/22/19 2350 09/23/19 0521 09/23/19 0731 09/23/19 1319  BP: (!) 142/62 111/83 (!) 115/46 (!) 144/62  Pulse: 72 84 90 90  Resp: 17 17 16 16   Temp: 98.1 F (36.7 C) 98.1 F (36.7 C) 98.5 F (36.9 C) 97.8 F (36.6 C)  TempSrc:    Oral  SpO2: 98% 98% 98% 99%    Weight:      Height:        Intake/Output Summary (Last 24 hours) at 09/23/2019 1333 Last data filed at 09/23/2019 0421 Gross per 24 hour  Intake 1761.36 ml  Output 900 ml  Net 861.36 ml   Filed Weights   09/19/19 2024  Weight: 76.2 kg    Examination:  General exam: NAD Respiratory system: CTA Cardiovascular system: S 1, S 2 RRR Gastrointestinal system: BS present, soft, nt Central nervous system: Alert, conversant, speech clear, left side weakness.  Extremities: no edema  Data Reviewed: I have personally reviewed following labs and imaging studies  CBC: Recent Labs  Lab 09/19/19 2029 09/21/19 0459 09/23/19 0455  WBC 10.1 8.6 9.3  HGB 12.2 11.4* 11.4*  HCT 38.1 35.9* 34.3*  MCV 97.7 97.8 94.0  PLT 242 191 196   Basic Metabolic Panel: Recent Labs  Lab 09/19/19 2029 09/21/19 0459 09/23/19 0455  NA 139 142 138  K 4.4 4.0 3.4*  CL 103 106 100  CO2 28 29 27   GLUCOSE 234* 145* 146*  BUN 15 17 13   CREATININE 0.75 0.69 0.62  CALCIUM 9.3 8.9 8.8*   GFR: Estimated Creatinine Clearance: 48.6 mL/min (by C-G formula based on SCr of 0.62 mg/dL). Liver Function Tests: No results for input(s): AST, ALT, ALKPHOS, BILITOT, PROT, ALBUMIN in the last 168 hours. No results for input(s): LIPASE, AMYLASE in the last 168 hours. No results for input(s): AMMONIA in the last 168 hours. Coagulation Profile: No results for input(s): INR, PROTIME in the last 168 hours. Cardiac Enzymes: No results for input(s): CKTOTAL, CKMB, CKMBINDEX, TROPONINI in the last 168 hours. BNP (last 3 results) No results for input(s): PROBNP in the last 8760 hours. HbA1C: Recent Labs    09/21/19 0459 09/23/19 0455  HGBA1C 8.1* 7.8*   CBG: Recent Labs  Lab 09/22/19 1215 09/22/19 1540 09/22/19 1956 09/23/19 0825 09/23/19 1145  GLUCAP 215* 161* 194* 135* 186*   Lipid Profile: Recent Labs    09/23/19 0455  CHOL 150  HDL 45  LDLCALC 86  TRIG 94  CHOLHDL 3.3   Thyroid Function  Tests: No results for input(s): TSH, T4TOTAL, FREET4, T3FREE, THYROIDAB in the last 72 hours. Anemia Panel: Recent Labs    09/21/19 0458  VITAMINB12 277   Sepsis Labs: No results for input(s): PROCALCITON, LATICACIDVEN in the last 168 hours.  Recent Results (from the past 240 hour(s))  Urine culture     Status: Abnormal   Collection Time: 09/19/19  8:29 PM   Specimen: Urine, Random  Result Value Ref Range Status   Specimen Description   Final    URINE, RANDOM Performed at [facility], [address], [place], [facility] [address]    Special Requests   Final    NONE Performed at [facility], [address] [place], [facility] [address]    Culture MULTIPLE SPECIES PRESENT, SUGGEST RECOLLECTION (A)  Final   Report Status 09/21/2019 FINAL  Final  SARS Coronavirus 2 by RT PCR (hospital order,  performed in [facility] hospital lab) Nasopharyngeal Nasopharyngeal Swab     Status: None   Collection Time: 09/20/19  9:04 AM   Specimen: Nasopharyngeal Swab  Result Value Ref Range Status   SARS Coronavirus 2 NEGATIVE NEGATIVE Final    Comment: (NOTE) SARS-CoV-2 target nucleic acids are NOT DETECTED.  The SARS-CoV-2 RNA is generally detectable in upper and lower respiratory specimens during the acute phase of infection. The lowest concentration of SARS-CoV-2 viral copies this assay can detect is 250 copies / mL. A negative result does not preclude SARS-CoV-2 infection and should not be used as the sole basis for treatment or other patient management decisions.  A negative result may occur with improper specimen collection / handling, submission of specimen other than nasopharyngeal swab, presence of viral mutation(s) within the areas targeted by this assay, and inadequate number of viral copies (<250 copies / mL). A negative result must be combined with clinical observations, patient history, and epidemiological information.  Fact Sheet for Patients:     BoilerBrush.com.cy  Fact Sheet for Healthcare Providers: https://pope.com/  This test is not yet approved or  cleared by the [place] FDA and has been authorized for detection and/or diagnosis of SARS-CoV-2 by FDA under an Emergency Use Authorization (EUA).  This EUA will remain in effect (meaning this test can be used) for the duration of the COVID-19 declaration under Section 564(b)(1) of the Act, 21 U.S.C. section 360bbb-3(b)(1), unless the authorization is terminated or revoked sooner.  Performed at [facility], [address], [place], [place] [address]          Radiology Studies: CT ANGIO HEAD W OR WO CONTRAST  Result Date: 09/22/2019 CLINICAL DATA:  New onset left-sided weakness. EXAM: CT ANGIOGRAPHY HEAD AND NECK TECHNIQUE: Multidetector CT imaging of the head and neck was performed using the standard protocol during bolus administration of intravenous contrast. Multiplanar CT image reconstructions and MIPs were obtained to evaluate the vascular anatomy. Carotid stenosis measurements (when applicable) are obtained utilizing NASCET criteria, using the distal internal carotid diameter as the denominator. CONTRAST:  41mL OMNIPAQUE IOHEXOL 350 MG/ML SOLN COMPARISON:  Head CT same day FINDINGS: CTA NECK FINDINGS Aortic arch: Aortic atherosclerosis. Branching pattern is normal without origin stenosis. Right carotid system: Common carotid artery is tortuous but widely patent to the bifurcation. Calcified plaque at the carotid bifurcation and ICA bulb. Proximal ICA is markedly tortuous. No stenosis however. External carotid artery shows severe stenosis. Cervical ICA widely patent beyond that. Left carotid system: Common carotid artery widely patent to the bifurcation. Atherosclerotic calcification at the carotid bifurcation but without ICA stenosis. External carotid artery shows severe stenosis. Vertebral arteries: Both  vertebral artery origins are patent. Both vertebral arteries are patent through the cervical region to the foramen magnum. Skeleton: Chronic fusion throughout the cervical region. Osteophytic narrowing of the canal at C6-7. Other neck: No mass or lymphadenopathy. Upper chest: Negative Review of the MIP images confirms the above findings CTA HEAD FINDINGS Anterior circulation: Both internal carotid arteries are patent through the skull base and siphon regions. Siphon atherosclerotic calcification but without stenosis greater than 30%. The anterior and middle cerebral vessels are patent without aneurysm or vascular malformation. There is moderate to severe stenosis of the left M1 segment. No large or medium vessel occlusion. Posterior circulation: Both vertebral arteries are patent through the foramen magnum. Posteroinferior cerebellar arteries show flow. Both vertebral arteries reach the basilar. Stenosis of the distal right vertebral artery just proximal to the basilar  artery. The left vertebral artery is dominant. No basilar stenosis. Superior cerebellar and posterior cerebral arteries are patent. Moderate stenosis of both posterior cerebral arteries at the P1 P2 junction regions. Venous sinuses: Patent and normal. Anatomic variants: None significant. Review of the MIP images confirms the above findings IMPRESSION: 1. No acute large or medium vessel occlusion. 2. Atherosclerotic disease at both carotid bifurcations but without ICA stenosis. Severe bilateral external carotid artery stenoses. 3. Moderate to severe stenosis of the left MCA M1 segment. 4. Moderate stenosis of both posterior cerebral arteries at the P1 P2 junction regions. 5. Stenosis of the distal right vertebral artery just proximal to the basilar artery. Aortic Atherosclerosis (ICD10-I70.0). Electronically Signed   By: Paulina FusiMark  Shogry M.D.   On: 09/22/2019 11:40   CT HEAD WO CONTRAST  Result Date: 09/23/2019 CLINICAL DATA:  Left-sided weakness EXAM:  CT HEAD WITHOUT CONTRAST TECHNIQUE: Contiguous axial images were obtained from the base of the skull through the vertex without intravenous contrast. COMPARISON:  September 22, 2019 FINDINGS: Brain: Mild diffuse atrophy is stable. There is no evident intracranial mass, hemorrhage, extra-axial fluid collection, or midline shift. Stable appearing infarct involving portions of the right pons and midbrain. Stable appearing prior infarct involving portions of the anterior limbs of the internal and external capsule on the left. There is stable patchy small vessel disease in the centra semiovale bilaterally. No new infarct evident compared to 1 day prior. Vascular: No hyperdense vessel. There is calcification in each carotid siphon region. Skull: The bony calvarium appears intact. Sinuses/Orbits: There is mucosal thickening in several ethmoid air cells. Other visualized paranasal sinuses are clear. No intraorbital lesions. A metallic foreign body is again noted along the medial right preseptal orbital region. Other: Mastoid air cells are clear. IMPRESSION: Prior anterior left basal ganglia and right pons/midbrain infarcts, stable. Stable patchy periventricular small vessel disease. No mass or hemorrhage. No acute acute infarct compared to 1 day prior evident. There are foci of arterial vascular calcification. Metallic foreign body in the medial preseptal right orbital region is stable. Mucosal thickening noted in several ethmoid air cells. Electronically Signed   By: Bretta BangWilliam  Woodruff III M.D.   On: 09/23/2019 10:17   CT HEAD WO CONTRAST  Result Date: 09/22/2019 CLINICAL DATA:  New onset left-sided weakness EXAM: CT HEAD WITHOUT CONTRAST TECHNIQUE: Contiguous axial images were obtained from the base of the skull through the vertex without intravenous contrast. COMPARISON:  09/20/2019 FINDINGS: Brain: Mild chronic white matter ischemic change is again identified. Area of prior left basal ganglia lacunar infarct is noted.  No evidence of acute hemorrhage is seen. There is a geographic area of decreased attenuation identified in the pons which appears new from the prior exam and may represent an evolving infarct. Vascular: No hyperdense vessel or unexpected calcification. Skull: Normal. Negative for fracture or focal lesion. Sinuses/Orbits: No acute finding. Metallic foreign body is again noted soft tissues adjacent to right orbit. Other: None IMPRESSION: Chronic white matter ischemic change. Stable left basal ganglia infarct. Area of decreased attenuation in the right half of the pons which may represent an evolving infarct. Follow-up imaging may be helpful. Radiopaque foreign body adjacent to the right orbit stable from the prior exam. Electronically Signed   By: Alcide CleverMark  Lukens M.D.   On: 09/22/2019 10:46   CT ANGIO NECK W OR WO CONTRAST  Result Date: 09/22/2019 CLINICAL DATA:  New onset left-sided weakness. EXAM: CT ANGIOGRAPHY HEAD AND NECK TECHNIQUE: Multidetector CT imaging of the head and  neck was performed using the standard protocol during bolus administration of intravenous contrast. Multiplanar CT image reconstructions and MIPs were obtained to evaluate the vascular anatomy. Carotid stenosis measurements (when applicable) are obtained utilizing NASCET criteria, using the distal internal carotid diameter as the denominator. CONTRAST:  30mL OMNIPAQUE IOHEXOL 350 MG/ML SOLN COMPARISON:  Head CT same day FINDINGS: CTA NECK FINDINGS Aortic arch: Aortic atherosclerosis. Branching pattern is normal without origin stenosis. Right carotid system: Common carotid artery is tortuous but widely patent to the bifurcation. Calcified plaque at the carotid bifurcation and ICA bulb. Proximal ICA is markedly tortuous. No stenosis however. External carotid artery shows severe stenosis. Cervical ICA widely patent beyond that. Left carotid system: Common carotid artery widely patent to the bifurcation. Atherosclerotic calcification at the  carotid bifurcation but without ICA stenosis. External carotid artery shows severe stenosis. Vertebral arteries: Both vertebral artery origins are patent. Both vertebral arteries are patent through the cervical region to the foramen magnum. Skeleton: Chronic fusion throughout the cervical region. Osteophytic narrowing of the canal at C6-7. Other neck: No mass or lymphadenopathy. Upper chest: Negative Review of the MIP images confirms the above findings CTA HEAD FINDINGS Anterior circulation: Both internal carotid arteries are patent through the skull base and siphon regions. Siphon atherosclerotic calcification but without stenosis greater than 30%. The anterior and middle cerebral vessels are patent without aneurysm or vascular malformation. There is moderate to severe stenosis of the left M1 segment. No large or medium vessel occlusion. Posterior circulation: Both vertebral arteries are patent through the foramen magnum. Posteroinferior cerebellar arteries show flow. Both vertebral arteries reach the basilar. Stenosis of the distal right vertebral artery just proximal to the basilar artery. The left vertebral artery is dominant. No basilar stenosis. Superior cerebellar and posterior cerebral arteries are patent. Moderate stenosis of both posterior cerebral arteries at the P1 P2 junction regions. Venous sinuses: Patent and normal. Anatomic variants: None significant. Review of the MIP images confirms the above findings IMPRESSION: 1. No acute large or medium vessel occlusion. 2. Atherosclerotic disease at both carotid bifurcations but without ICA stenosis. Severe bilateral external carotid artery stenoses. 3. Moderate to severe stenosis of the left MCA M1 segment. 4. Moderate stenosis of both posterior cerebral arteries at the P1 P2 junction regions. 5. Stenosis of the distal right vertebral artery just proximal to the basilar artery. Aortic Atherosclerosis (ICD10-I70.0). Electronically Signed   By: Paulina Fusi  M.D.   On: 09/22/2019 11:40   US Carotid Bilateral (at [facility] and [facility] only)  Result Date: 09/22/2019 CLINICAL DATA:  Left-sided weakness, possible acute stroke, hypertension, hyperlipidemia, diabetes and syncope. EXAM: BILATERAL CAROTID DUPLEX ULTRASOUND TECHNIQUE: Wallace Cullens scale imaging, color Doppler and duplex ultrasound were performed of bilateral carotid and vertebral arteries in the neck. COMPARISON:  None. FINDINGS: Criteria: Quantification of carotid stenosis is based on velocity parameters that correlate the residual internal carotid diameter with NASCET-based stenosis levels, using the diameter of the distal internal carotid lumen as the denominator for stenosis measurement. The following velocity measurements were obtained: RIGHT ICA:  104/16 cm/sec CCA:  105/13 cm/sec SYSTOLIC ICA/CCA RATIO:  1.0 ECA:  195 cm/sec LEFT ICA:  128/27 cm/sec CCA:  97/11 cm/sec SYSTOLIC ICA/CCA RATIO:  1.3 ECA:  223 cm/sec RIGHT CAROTID ARTERY: Moderate predominately calcified plaque at the level of the carotid bulb and external carotid origin. Mild plaque at the level of the proximal right ICA. Estimated right ICA stenosis is less than 50%. RIGHT VERTEBRAL ARTERY: Antegrade flow with normal waveform and  velocity. LEFT CAROTID ARTERY: No focal plaque or evidence of left-sided carotid stenosis. The left ICA is moderately tortuous. LEFT VERTEBRAL ARTERY: Antegrade flow with normal waveform and velocity. IMPRESSION: Plaque at the level of the right carotid bifurcation without evidence of significant right ICA stenosis. Estimated right ICA stenosis is less than 50%. No evidence of left-sided carotid plaque or stenosis. The left ICA is tortuous. Electronically Signed   By: Irish Lack M.D.   On: 09/22/2019 11:49   ECHOCARDIOGRAM COMPLETE  Result Date: 09/22/2019    ECHOCARDIOGRAM REPORT   Patient Name:   [name] Date of Exam: 09/21/2019 Medical Rec #:  [id number]        Height:       60.0 in Accession #:    [id number]        Weight:       168.0 lb Date of Birth:  08-17-1933       BSA:          1.733 m Patient Age:    85 years         BP:           168/95 mmHg Patient Gender: F                HR:           59 bpm. Exam Location:  [facility] Procedure: 2D Echo, Cardiac Doppler and Color Doppler Indications:     Dyspnea 786.09  History:         Patient has no prior history of Echocardiogram examinations.                  Risk Factors:Hypertension, Diabetes and Dyslipidemia.  Sonographer:     Cristela Blue RDCS (AE) Referring Phys:  [id number] Monica Martinez [facility] Diagnosing Phys: Arnoldo Hooker MD IMPRESSIONS  1. Left ventricular ejection fraction, by estimation, is 55 to 60%. The left ventricle has normal function. The left ventricle has no regional wall motion abnormalities. Left ventricular diastolic parameters were normal.  2. Right ventricular systolic function is normal. The right ventricular size is normal. There is mildly elevated pulmonary artery systolic pressure.  3. The mitral valve is normal in structure. Trivial mitral valve regurgitation.  4. The aortic valve is normal in structure. Aortic valve regurgitation is trivial. FINDINGS  Left Ventricle: Left ventricular ejection fraction, by estimation, is 55 to 60%. The left ventricle has normal function. The left ventricle has no regional wall motion abnormalities. The left ventricular internal cavity size was normal in size. There is  no left ventricular hypertrophy. Left ventricular diastolic parameters were normal. Right Ventricle: The right ventricular size is normal. No increase in right ventricular wall thickness. Right ventricular systolic function is normal. There is mildly elevated pulmonary artery systolic pressure. The tricuspid regurgitant velocity is 2.85  m/s, and with an assumed right atrial pressure of 10 mmHg, the estimated right ventricular systolic pressure is 42.5 mmHg. Left Atrium: Left atrial size was normal in size. Right Atrium: Right atrial size was normal in  size. Pericardium: There is no evidence of pericardial effusion. Mitral Valve: The mitral valve is normal in structure. Trivial mitral valve regurgitation. Tricuspid Valve: The tricuspid valve is normal in structure. Tricuspid valve regurgitation is trivial. Aortic Valve: The aortic valve is normal in structure. Aortic valve regurgitation is trivial. Aortic valve mean gradient measures 3.7 mmHg. Aortic valve peak gradient measures 6.9 mmHg. Aortic valve area, by VTI measures 2.27 cm. Pulmonic Valve: The pulmonic valve was  normal in structure. Pulmonic valve regurgitation is not visualized. Aorta: The aortic root and ascending aorta are structurally normal, with no evidence of dilitation. IAS/Shunts: No atrial level shunt detected by color flow Doppler.  LEFT VENTRICLE PLAX 2D LVIDd:         3.53 cm  Diastology LVIDs:         2.47 cm  LV e' lateral:   9.14 cm/s LV PW:         1.43 cm  LV E/e' lateral: 6.6 LV IVS:        1.90 cm  LV e' medial:    3.70 cm/s LVOT diam:     2.00 cm  LV E/e' medial:  16.2 LV SV:         69 LV SV Index:   40 LVOT Area:     3.14 cm  RIGHT VENTRICLE RV Basal diam:  2.70 cm RV S prime:     14.70 cm/s TAPSE (M-mode): 3.2 cm LEFT ATRIUM             Index       RIGHT ATRIUM           Index LA diam:        3.40 cm 1.96 cm/m  RA Area:     13.50 cm LA Vol (A2C):   57.3 ml 33.06 ml/m RA Volume:   34.70 ml  20.02 ml/m LA Vol (A4C):   41.5 ml 23.94 ml/m LA Biplane Vol: 51.5 ml 29.71 ml/m  AORTIC VALVE                   PULMONIC VALVE AV Area (Vmax):    1.88 cm    PV Vmax:        0.64 m/s AV Area (Vmean):   1.94 cm    PV Peak grad:   1.7 mmHg AV Area (VTI):     2.27 cm    RVOT Peak grad: 2 mmHg AV Vmax:           131.00 cm/s AV Vmean:          93.233 cm/s AV VTI:            0.305 m AV Peak Grad:      6.9 mmHg AV Mean Grad:      3.7 mmHg LVOT Vmax:         78.30 cm/s LVOT Vmean:        57.600 cm/s LVOT VTI:          0.220 m LVOT/AV VTI ratio: 0.72  AORTA Ao Root diam: 2.50 cm MITRAL VALVE                TRICUSPID VALVE MV Area (PHT): 1.97 cm    TR Peak grad:   32.5 mmHg MV Decel Time: 385 msec    TR Vmax:        285.00 cm/s MV E velocity: 60.10 cm/s MV A velocity: 99.40 cm/s  SHUNTS MV E/A ratio:  0.60        Systemic VTI:  0.22 m                            Systemic Diam: 2.00 cm Arnoldo Hooker MD Electronically signed by Arnoldo Hooker MD Signature Date/Time: 09/22/2019/8:55:16 AM    Final         Scheduled Meds: . aspirin EC  325 mg Oral Daily  . atorvastatin  80  mg Oral Daily  . clopidogrel  75 mg Oral Daily  . enoxaparin (LOVENOX) injection  40 mg Subcutaneous Q24H  . insulin aspart  0-5 Units Subcutaneous QHS  . insulin aspart  0-9 Units Subcutaneous TID WC  . vitamin B-12  100 mcg Oral Daily   Continuous Infusions: . sodium chloride 100 mL/hr at 09/22/19 2227  . cefTRIAXone (ROCEPHIN)  IV 1 g (09/23/19 0554)     LOS: 1 day    Time spent:35 minutes.     Alba Cory, MD [facility]   If 7PM-7AM, please contact night-coverage [email or web address]  09/23/2019, 1:33 PM
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
## 2019-09-24 ENCOUNTER — Inpatient Hospital Stay: Payer: Medicare HMO

## 2019-09-24 DIAGNOSIS — N3001 Acute cystitis with hematuria: Secondary | ICD-10-CM | POA: Diagnosis not present

## 2019-09-24 LAB — BASIC METABOLIC PANEL
Anion gap: 8 (ref 5–15)
BUN: 11 mg/dL (ref 8–23)
CO2: 25 mmol/L (ref 22–32)
Calcium: 8.4 mg/dL — ABNORMAL LOW (ref 8.9–10.3)
Chloride: 104 mmol/L (ref 98–111)
Creatinine, Ser: 0.59 mg/dL (ref 0.44–1.00)
GFR calc Af Amer: >60 mL/min (ref >60)
GFR calc non Af Amer: >60 mL/min (ref >60)
Glucose, Bld: 179 mg/dL — ABNORMAL HIGH (ref 70–99)
Potassium: 3.8 mmol/L (ref 3.5–5.1)
Sodium: 137 mmol/L (ref 135–145)

## 2019-09-24 LAB — SARS CORONAVIRUS 2 (TAT 6-24 HRS): SARS Coronavirus 2: NEGATIVE

## 2019-09-24 LAB — GLUCOSE, CAPILLARY
Glucose-Capillary: 162 mg/dL — ABNORMAL HIGH (ref 70–99)
Glucose-Capillary: 165 mg/dL — ABNORMAL HIGH (ref 70–99)
Glucose-Capillary: 267 mg/dL — ABNORMAL HIGH (ref 70–99)
Glucose-Capillary: 224 mg/dL — ABNORMAL HIGH (ref 70–99)
Glucose-Capillary: 186 mg/dL — ABNORMAL HIGH (ref 70–99)

## 2019-09-24 MED ORDER — DICLOFENAC SODIUM 1 % EX GEL
2.0000 g | Freq: Two times a day (BID) | CUTANEOUS | Status: DC
  Administered 2019-09-24 – 2019-09-26 (×6): 2 g via TOPICAL
  Filled 2019-09-24: qty 100

## 2019-09-24 MED ORDER — ACETAMINOPHEN 325 MG PO TABS
650.0000 mg | ORAL_TABLET | Freq: Three times a day (TID) | ORAL | Status: DC
  Administered 2019-09-24 – 2019-09-27 (×9): 650 mg via ORAL
  Filled 2019-09-24 (×9): qty 2

## 2019-09-24 MED ORDER — LISINOPRIL 10 MG PO TABS
10.0000 mg | ORAL_TABLET | Freq: Every day | ORAL | Status: DC
  Administered 2019-09-24 – 2019-09-27 (×4): 10 mg via ORAL
  Filled 2019-09-24 (×4): qty 1

## 2019-09-24 NOTE — Progress Notes (Signed)
Physical Therapy Treatment Patient Details Name: [name] MRN: [id number] DOB: 07-03-1933 Today's Date: 09/24/2019    History of Present Illness [name] is a 86 y.o. female past medical history of diabetes, diabetic polyneuropathy, hyperlipidemia, hypertension presented to the emergency room 09/20/2019 for dizziness and increased urinary frequency. She was admitted and treated for possible UTI, with antibiotics. She was also found to be confused, and her SBP was markedly elevated, which also required treatment.  This was managed with antihypertensives. On 09/22/19 pt was noted to have markedly decreased LLE/LUE functional movement and code stroke was called. F/u imaging showed Stable left basal ganglia infarct as well as "Area of decreased attenuation in the right half of the pons which may represent an evolving infarct".    PT Comments    Patient is able to increase independence with rolling and supine<>sit transfer with cuing for RLE "hooking" LLE and use of RUE with handrails, modA x2. Once EOB patient is able to remain sitting with RUE support ind intermittently, mostly needing modA to remain sitting. Patient is able to maintain sitting balance ind with reaching to R with RUE, needs modA to remain sitting with reaching to L with RUE. STS modA x2 with LLE blocking and LUE support. Patient remains standing modA x2 for a few mins, sits, and modA x2 to restand to adjust in bed. Once back in bed patient is able to complete LLE heel slide (unable to move LLE at all at start of session), which is encouraging to patient and family. PT encouraged patient to continue this in bed throughout the day. Would benefit from skilled PT to address above deficits and promote optimal return to PLOF.   Follow Up Recommendations  SNF     Equipment Recommendations  None recommended by PT    Recommendations for Other Services       Precautions / Restrictions Precautions Precautions:  Fall Precaution Comments: be protective of L shoulder to prevent subluxation Restrictions Weight Bearing Restrictions: No    Mobility  Bed Mobility Overal bed mobility: Needs Assistance Bed Mobility: Supine to Sit;Sit to Supine;Rolling Rolling: Mod assist   Supine to sit: Mod assist;HOB elevated;+2 for physical assistance Sit to supine: Mod assist;+2 for physical assistance   General bed mobility comments: Complies with cuing for RLE to aid in LLE negotiation and RUE with handrail with modA x2 ultimately needed for safety  Transfers Overall transfer level: Needs assistance Equipment used: Rolling walker (2 wheeled) Transfers: Sit to/from Stand Sit to Stand: +2 physical assistance;Max assist         General transfer comment: L knee block and LUE support t/o; able to remain standing modA +2 ; and again to reposition  Ambulation/Gait             General Gait Details: Not able to attempt   Stairs             Wheelchair Mobility    Modified Rankin (Stroke Patients Only)       Balance Overall balance assessment: Needs assistance Sitting-balance support: Feet supported;Single extremity supported Sitting balance-Leahy Scale: Fair Sitting balance - Comments: Patient is able to sit without PT/OT support intermittently, and reach with RUE. Decreased sitting balance after a few mins d/t fatigue Postural control: Posterior lean Standing balance support: Single extremity supported;During functional activity Standing balance-Leahy Scale: Zero Standing balance comment: MAX A x2 + RUE support on RW for ~30" static standing x2 trials  Cognition Arousal/Alertness: Awake/alert Behavior During Therapy: WFL for tasks assessed/performed Overall Cognitive Status: Impaired/Different from baseline                         Following Commands: Follows one step commands with increased time Safety/Judgement: Decreased  awareness of safety;Decreased awareness of deficits Awareness: Emergent   General Comments: Pt oriented to self, place, and date. Initially does not recognize daughter when she enters the room, but does consistently identify her after min cueing from daughter. Decreased awareness of deficits.      Exercises Other Exercises Other Exercises: Rolling modA with good carry over of cuing for increased ind with use of UEs Other Exercises: Supine <> sit modA x2 with HOB elevated, good carry over of increasing ind with UE use on bedrials and "hooking" LLE with RLE with good carry over. Once sitting able to remain sitting with RUE and RLE support intermittently without assistance, mostly in need of modA; completed reaching in sitting outside BOS with support needed when reaching to L Other Exercises: STS with blocking LLE and supporting LUE modA x2 for safety, able to remain standing modA x2 for a couple minutes, modAx2 for subsequent STS Other Exercises: Heel slides x10 supine    General Comments General comments (skin integrity, edema, etc.): SpO2 98% on RA      Pertinent Vitals/Pain Pain Assessment: Faces Faces Pain Scale: Hurts little more Pain Location: L thumb/hand, shoulder Pain Descriptors / Indicators: Discomfort;Grimacing;Sore Pain Intervention(s): Limited activity within patient's tolerance    Home Living                      Prior Function            PT Goals (current goals can now be found in the care plan section) Acute Rehab PT Goals Patient Stated Goal: to go home PT Goal Formulation: With patient Time For Goal Achievement: 10/06/19 Potential to Achieve Goals: Good Additional Goals Additional Goal #1: Pt will be able to perform transfers with minimal assist and LRAD in order to improve functional independence Progress towards PT goals: Progressing toward goals    Frequency    7X/week      PT Plan Current plan remains appropriate    Co-evaluation  PT/OT/SLP Co-Evaluation/Treatment: Yes Reason for Co-Treatment: Complexity of the patient's impairments (multi-system involvement);For patient/therapist safety;To address functional/ADL transfers PT goals addressed during session: Mobility/safety with mobility;Strengthening/ROM;Balance OT goals addressed during session: ADL's and self-care;Proper use of Adaptive equipment and DME;Strengthening/ROM      AM-PAC PT "6 Clicks" Mobility   Outcome Measure  Help needed turning from your back to your side while in a flat bed without using bedrails?: A Lot Help needed moving from lying on your back to sitting on the side of a flat bed without using bedrails?: A Lot Help needed moving to and from a bed to a chair (including a wheelchair)?: A Lot Help needed standing up from a chair using your arms (e.g., wheelchair or bedside chair)?: A Lot Help needed to walk in hospital room?: Total Help needed climbing 3-5 steps with a railing? : Total 6 Click Score: 10    End of Session Equipment Utilized During Treatment: Gait belt Activity Tolerance: Patient limited by fatigue Patient left: in bed;with call bell/phone within reach;with bed alarm set;with family/visitor present Nurse Communication: Mobility status PT Visit Diagnosis: Unsteadiness on feet (R26.81);Muscle weakness (generalized) (M62.81);Difficulty in walking, not elsewhere classified (R26.2);History of falling (Z91.81);Hemiplegia and  hemiparesis;Pain Hemiplegia - Right/Left: Left Hemiplegia - dominant/non-dominant: Non-dominant Hemiplegia - caused by: Cerebral infarction Pain - Right/Left: Left Pain - part of body: Shoulder     Time: 3295-1884 PT Time Calculation (min) (ACUTE ONLY): 23 min  Charges:  $Therapeutic Activity: 23-37 mins                    Hilda Lias DPT   Hilda Lias 09/24/2019, 12:13 PM

## 2019-09-24 NOTE — Progress Notes (Signed)
PROGRESS NOTE    [name]  ZOX:[id number] DOB: 23-Jul-1933 DOA: 09/20/2019 PCP: Gracelyn Nurse, MD   Brief Narrative: 86 year old past medical history significant for hypertension, hyperlipidemia, diverticulitis, diabetes who presents with acute metabolic encephalopathy, unsteady gait, frequent urination and right thigh discomfort for the last few days.  Patient presented with hypertensive crisis, UA positive for UTI, Kovic 19 test negative.  CT head negative for acute intracranial abnormality.  Chest x-ray; no acute cardiopulmonary disease  Assessment & Plan:   Principal Problem:   UTI (urinary tract infection) Active Problems:   Type 2 diabetes mellitus with polyneuropathy (HCC)   Mixed hyperlipidemia   Essential hypertension   1-Left side weakness; Acute pontine stroke. Small vessel diseases.  Patient complained of left arm and leg pain. She was not able to move left side, due to pain and weakness. Patient reported she wake up with this symptoms, left side pain and weakness. I discussed with patient's Daughter; patient was able to move left side prior to admission. I consulted Neurology. Plan was for activation of Code stroke.  -Not TPA candidate, unknown time of symptoms.  -CT head ordered stat: Chronic white matter ischemic change. Stable left basal ganglia Infarct. Area of decreased attenuation in the right half of the pons which may represent an evolving infarct. Follow-up imaging may be helpful. -Aspirin increase to 325 mg. Continue with statins.  -Stroke order set implemented.  -CTA; No acute large or medium vessel occlusion. Atherosclerotic disease at both carotid bifurcations but without ICA stenosis. Severe bilateral external carotid artery stenoses.Moderate to severe stenosis of the left MCA M1 segment. Moderate stenosis of both posterior cerebral arteries at the P1 P2 junction regions. Stenosis of the distal right vertebral artery just proximal to the basilar  artery. -permissive HTN.  She will need rehab.  Plan for aspirin 325 mg plus plavix for three months then plavix alone.  Repeated ct head stable.   Acute metabolic encephalopathy likely secondary to UTI Patient is alert and oriented. UA with 21-50 white blood cells. Treated with  IV ceftriaxone. Plan for 3 days of IV antibiotics.  CT head negative for acute abnormality. Unable to perform MRI due to foreign body eye  Urine culture multiple species present  Hypertensive crisis, uncontrolled hypertension: Chest x-ray negative. Doppler lower extremity negative for DVT Permissive HTN due to stroke. Will start low dose lisinopril/.  Echo; normal ef.   Dizziness/unsteady gait: CT negative. Patient unable to get MRI B12 low normal; 227. Started supplement.  PT eval  Diabetes type 2 Continue with a sliding scale insulin  Hyperlipidemia continue with statins Hypokalemia; replete orally.  Left arm pain; x ray: chronic rotator cough tear. Will need to follow up with ortho out patient.   Estimated body mass index is 32.81 kg/m as calculated from the following:   Height as of this encounter: 5' (1.524 m).   Weight as of this encounter: 76.2 kg.   DVT prophylaxis: Lovenox Code Status: Full code Family Communication: Son 8/14 Disposition Plan:  Status is: inpatient.    Dispo: The patient is from: Home              Anticipated d/c is to: SNF              Anticipated d/c date is: 2 days              Patient currently is not medically stable to d/c.  Patient with left side weakness, likely stroke. Needs SNF  Consultants:   None  Procedures:   Echo  Antimicrobials:  Ceftriaxone  Subjective: She is complaining of shoulder pain left side.  Finger pain  Objective: Vitals:   09/24/19 0021 09/24/19 0410 09/24/19 0836 09/24/19 1253  BP: (!) 143/97 (!) 171/73 (!) 195/80 (!) 159/65  Pulse: 71 66 73 87  Resp: 15 17 18 17   Temp: 98 F (36.7 C) 98 F (36.7 C) 98.2  F (36.8 C) 97.9 F (36.6 C)  TempSrc: Oral Oral    SpO2: 100% 98% 97% 97%  Weight:      Height:        Intake/Output Summary (Last 24 hours) at 09/24/2019 1441 Last data filed at 09/24/2019 1400 Gross per 24 hour  Intake 1569.14 ml  Output 1750 ml  Net -180.86 ml   Filed Weights   09/19/19 2024  Weight: 76.2 kg    Examination:  General exam: NAD Respiratory system: CTA Cardiovascular system: S 1, S 2 RRR Gastrointestinal system: BS present, soft, nt Central nervous system: Alert, left arm LE weakness Extremities: no edema  Data Reviewed: I have personally reviewed following labs and imaging studies  CBC: Recent Labs  Lab 09/19/19 2029 09/21/19 0459 09/23/19 0455  WBC 10.1 8.6 9.3  HGB 12.2 11.4* 11.4*  HCT 38.1 35.9* 34.3*  MCV 97.7 97.8 94.0  PLT 242 191 196   Basic Metabolic Panel: Recent Labs  Lab 09/19/19 2029 09/21/19 0459 09/23/19 0455 09/24/19 0743  NA 139 142 138 137  K 4.4 4.0 3.4* 3.8  CL 103 106 100 104  CO2 28 29 27 25   GLUCOSE 234* 145* 146* 179*  BUN 15 17 13 11   CREATININE 0.75 0.69 0.62 0.59  CALCIUM 9.3 8.9 8.8* 8.4*   GFR: Estimated Creatinine Clearance: 48.6 mL/min (by C-G formula based on SCr of 0.59 mg/dL). Liver Function Tests: No results for input(s): AST, ALT, ALKPHOS, BILITOT, PROT, ALBUMIN in the last 168 hours. No results for input(s): LIPASE, AMYLASE in the last 168 hours. No results for input(s): AMMONIA in the last 168 hours. Coagulation Profile: No results for input(s): INR, PROTIME in the last 168 hours. Cardiac Enzymes: No results for input(s): CKTOTAL, CKMB, CKMBINDEX, TROPONINI in the last 168 hours. BNP (last 3 results) No results for input(s): PROBNP in the last 8760 hours. HbA1C: Recent Labs    09/23/19 0455  HGBA1C 7.8*   CBG: Recent Labs  Lab 09/23/19 1650 09/23/19 2026 09/24/19 0409 09/24/19 0835 09/24/19 1251  GLUCAP 176* 192* 162* 165* 267*   Lipid Profile: Recent Labs    09/23/19 0455   CHOL 150  HDL 45  LDLCALC 86  TRIG 94  CHOLHDL 3.3   Thyroid Function Tests: No results for input(s): TSH, T4TOTAL, FREET4, T3FREE, THYROIDAB in the last 72 hours. Anemia Panel: No results for input(s): VITAMINB12, FOLATE, FERRITIN, TIBC, IRON, RETICCTPCT in the last 72 hours. Sepsis Labs: No results for input(s): PROCALCITON, LATICACIDVEN in the last 168 hours.  Recent Results (from the past 240 hour(s))  Urine culture     Status: Abnormal   Collection Time: 09/19/19  8:29 PM   Specimen: Urine, Random  Result Value Ref Range Status   Specimen Description   Final    URINE, RANDOM Performed at [facility], [address] [place], [place] [address]    Special Requests   Final    NONE Performed at [facility], [address] [place], [place] [address]    Culture MULTIPLE SPECIES PRESENT, SUGGEST RECOLLECTION (  A)  Final   Report Status 09/21/2019 FINAL  Final  SARS Coronavirus 2 by RT PCR (hospital order, performed in [facility] hospital lab) Nasopharyngeal Nasopharyngeal Swab     Status: None   Collection Time: 09/20/19  9:04 AM   Specimen: Nasopharyngeal Swab  Result Value Ref Range Status   SARS Coronavirus 2 NEGATIVE NEGATIVE Final    Comment: (NOTE) SARS-CoV-2 target nucleic acids are NOT DETECTED.  The SARS-CoV-2 RNA is generally detectable in upper and lower respiratory specimens during the acute phase of infection. The lowest concentration of SARS-CoV-2 viral copies this assay can detect is 250 copies / mL. A negative result does not preclude SARS-CoV-2 infection and should not be used as the sole basis for treatment or other patient management decisions.  A negative result may occur with improper specimen collection / handling, submission of specimen other than nasopharyngeal swab, presence of viral mutation(s) within the areas targeted by this assay, and inadequate number of viral copies (<250 copies / mL). A negative result must be  combined with clinical observations, patient history, and epidemiological information.  Fact Sheet for Patients:   BoilerBrush.com.cy  Fact Sheet for Healthcare Providers: https://pope.com/  This test is not yet approved or  cleared by the [place] FDA and has been authorized for detection and/or diagnosis of SARS-CoV-2 by FDA under an Emergency Use Authorization (EUA).  This EUA will remain in effect (meaning this test can be used) for the duration of the COVID-19 declaration under Section 564(b)(1) of the Act, 21 U.S.C. section 360bbb-3(b)(1), unless the authorization is terminated or revoked sooner.  Performed at [facility], [address] [place], [place] [address]   SARS CORONAVIRUS 2 (TAT 6-24 HRS) Nasopharyngeal Nasopharyngeal Swab     Status: None   Collection Time: 09/23/19  1:26 PM   Specimen: Nasopharyngeal Swab  Result Value Ref Range Status   SARS Coronavirus 2 NEGATIVE NEGATIVE Final    Comment: (NOTE) SARS-CoV-2 target nucleic acids are NOT DETECTED.  The SARS-CoV-2 RNA is generally detectable in upper and lower respiratory specimens during the acute phase of infection. Negative results do not preclude SARS-CoV-2 infection, do not rule out co-infections with other pathogens, and should not be used as the sole basis for treatment or other patient management decisions. Negative results must be combined with clinical observations, patient history, and epidemiological information. The expected result is Negative.  Fact Sheet for Patients: HairSlick.no  Fact Sheet for Healthcare Providers: quierodirigir.com  This test is not yet approved or cleared by the [place] FDA and  has been authorized for detection and/or diagnosis of SARS-CoV-2 by FDA under an Emergency Use Authorization (EUA). This EUA will remain  in effect (meaning this test can  be used) for the duration of the COVID-19 declaration under Se ction 564(b)(1) of the Act, 21 U.S.C. section 360bbb-3(b)(1), unless the authorization is terminated or revoked sooner.  Performed at [facility], [address], [place], [place] [address]          Radiology Studies: CT HEAD WO CONTRAST  Result Date: 09/23/2019 CLINICAL DATA:  Left-sided weakness EXAM: CT HEAD WITHOUT CONTRAST TECHNIQUE: Contiguous axial images were obtained from the base of the skull through the vertex without intravenous contrast. COMPARISON:  September 22, 2019 FINDINGS: Brain: Mild diffuse atrophy is stable. There is no evident intracranial mass, hemorrhage, extra-axial fluid collection, or midline shift. Stable appearing infarct involving portions of the right pons and midbrain. Stable appearing prior infarct involving portions  of the anterior limbs of the internal and external capsule on the left. There is stable patchy small vessel disease in the centra semiovale bilaterally. No new infarct evident compared to 1 day prior. Vascular: No hyperdense vessel. There is calcification in each carotid siphon region. Skull: The bony calvarium appears intact. Sinuses/Orbits: There is mucosal thickening in several ethmoid air cells. Other visualized paranasal sinuses are clear. No intraorbital lesions. A metallic foreign body is again noted along the medial right preseptal orbital region. Other: Mastoid air cells are clear. IMPRESSION: Prior anterior left basal ganglia and right pons/midbrain infarcts, stable. Stable patchy periventricular small vessel disease. No mass or hemorrhage. No acute acute infarct compared to 1 day prior evident. There are foci of arterial vascular calcification. Metallic foreign body in the medial preseptal right orbital region is stable. Mucosal thickening noted in several ethmoid air cells. Electronically Signed   By: Bretta Bang III M.D.   On: 09/23/2019 10:17   DG Hand 2 View  Left  Result Date: 09/24/2019 CLINICAL DATA:  Left hand and wrist pain.  No injury. EXAM: LEFT HAND - 2 VIEW COMPARISON:  None. FINDINGS: Moderate degenerative changes over the radiocarpal joint and distal radioulnar joints. Suggestion of an old ulnar styloid fracture. Degenerative change of the carpal bones and first carpometacarpal joints. Mild degenerative change throughout the interphalangeal joints and minimal degenerative change involving the MCP joints. No definite bony erosions. Alignment and mineralization is normal. Small vessel atherosclerotic disease. IMPRESSION: 1. No acute findings. 2. Moderate degenerative changes as described.  No erosive changes. Electronically Signed   By: Elberta Fortis M.D.   On: 09/24/2019 14:24   DG Shoulder Left  Result Date: 09/24/2019 CLINICAL DATA:  Left shoulder pain.  No injury. EXAM: LEFT SHOULDER - 2+ VIEW COMPARISON:  None. FINDINGS: There is moderate degenerative change of the glenohumeral joint and AC joints. Significant decreased acromial humeral joint space. No acute fracture or dislocation. IMPRESSION: 1. No acute findings. 2. Moderate degenerative changes of the glenohumeral joint and AC joints. Decreased acromial humeral joint space suggesting chronic rotator cuff tear. Electronically Signed   By: Elberta Fortis M.D.   On: 09/24/2019 14:21        Scheduled Meds: . acetaminophen  650 mg Oral TID  . aspirin EC  325 mg Oral Daily  . atorvastatin  80 mg Oral Daily  . clopidogrel  75 mg Oral Daily  . enoxaparin (LOVENOX) injection  40 mg Subcutaneous Q24H  . insulin aspart  0-5 Units Subcutaneous QHS  . insulin aspart  0-9 Units Subcutaneous TID WC  . lisinopril  10 mg Oral Daily  . vitamin B-12  100 mcg Oral Daily   Continuous Infusions: . sodium chloride 100 mL/hr at 09/24/19 1025     LOS: 2 days    Time spent:35 minutes.     Alba Cory, MD [facility]   If 7PM-7AM, please contact  night-coverage [email or web address]  09/24/2019, 2:41 PM

## 2019-09-24 NOTE — Progress Notes (Signed)
Occupational Therapy Treatment Patient Details Name: [name] MRN: [id number] DOB: 01/11/1934 Today's Date: 09/24/2019    History of present illness [name] is a 86 y.o. female past medical history of diabetes, diabetic polyneuropathy, hyperlipidemia, hypertension presented to the emergency room 09/20/2019 for dizziness and increased urinary frequency. She was admitted and treated for possible UTI, with antibiotics. She was also found to be confused, and her SBP was markedly elevated, which also required treatment.  This was managed with antihypertensives. On 09/22/19 pt was noted to have markedly decreased LLE/LUE functional movement and code stroke was called. F/u imaging showed Stable left basal ganglia infarct as well as "Area of decreased attenuation in the right half of the pons which may represent an evolving infarct".   OT comments  [name] was seen for OT/PT co-treatment on this date. Upon arrival to room pt awake reclined in bed c son present in room, agreeable to session. MAX A x2 to exit L side of bed. Intermittently requires no physical assist for static sitting balance decreasing to MOD A for posterior lean t/o majority of ~10 mins sitting EOB. L lateral LOB noted during functional reach in sitting - required MIN A to correct. MAX A x2 + RW for RUE support sit<>stand x2 trials - required L knee block (buckling noted) and LUE support t/o. Tolerated <1 min standing each trial, achieved sliding LUE ~1inch along floor c assist to weight shift through RLE. Following return to bed c MAX A x2, pt demonstrated improved LUE strength/AROM - performed heel slides for functional bed mobility to scoot higher in trended bed. Pt continues to present c near flaccid LUE - achieves ~45* shoulder flexion AAROM at bed level. Pt instructed in neuromuscular re-ed techniques, LUE HEP, joint protection strategies, and positioning for LUE. Left at end of session c needs in reach, son at bedside,  and LUE supported on 2 pillows. Pt making good progress toward goals. Pt continues to benefit from skilled OT services to maximize return to PLOF and minimize risk of future falls, injury, caregiver burden, and readmission. Will continue to follow POC. Discharge recommendation remains appropriate.    Follow Up Recommendations  SNF;Supervision/Assistance - 24 hour    Equipment Recommendations   (TBD)    Recommendations for Other Services      Precautions / Restrictions Precautions Precautions: Fall Precaution Comments: be protective of L shoulder to prevent subluxation Restrictions Weight Bearing Restrictions: No       Mobility Bed Mobility Overal bed mobility: Needs Assistance Bed Mobility: Supine to Sit;Sit to Supine;Rolling Rolling: Max assist   Supine to sit: Max assist;+2 for physical assistance Sit to supine: Max assist;+2 for physical assistance   General bed mobility comments: Scoots self higher in bed when trended c VCs for LUE engagement  Transfers Overall transfer level: Needs assistance Equipment used: Rolling walker (2 wheeled) Transfers: Sit to/from Stand Sit to Stand: +2 physical assistance;Max assist     General transfer comment: L knee block and LUE support t/o. Assist to bear weight through RLE to slide LUE ~1 inch    Balance Overall balance assessment: Needs assistance Sitting-balance support: Feet supported;Single extremity supported Sitting balance-Leahy Scale: Fair Sitting balance - Comments: Intermittently requires no physical assist for static sitting balance decreasing to MOD A for posterior lean. L lateral LOB noted during functional reach in sitting - required MIN A to correct Postural control: Posterior lean Standing balance support: Single extremity supported;During functional activity Standing balance-Leahy Scale: Zero Standing balance  comment: MAX A x2 + RUE support on RW for ~30" static standing x2 trials      ADL either performed or  assessed with clinical judgement   ADL Overall ADL's : Needs assistance/impaired      General ADL Comments: SETUP self-drinking c dominant RUE at bed level. MAX A rolling L+R for pericare c TOTAL A for hygiene - pt able to grasp LUE usign RUE for safe joint protection during mobility however requires MAX cueing.       Cognition Arousal/Alertness: Awake/alert Behavior During Therapy: WFL for tasks assessed/performed      Following Commands: Follows one step commands with increased time Safety/Judgement: Decreased awareness of safety;Decreased awareness of deficits         Exercises Exercises: Other exercises Other Exercises Other Exercises: Pt and son educated re: neuromuscular re-ed techniques, LUE HEP, joint protection strategies, positioning for LUE Other Exercises: self-drinking, sup<>sit, sit<>stand x2, sitting/standing balance/tolerance, functional reach, AAROM LUE, bed mobility   Shoulder Instructions       General Comments SpO2 98% on RA    Pertinent Vitals/ Pain       Pain Assessment: Faces Faces Pain Scale: Hurts little more Pain Location: L thumb/hand Pain Descriptors / Indicators: Discomfort;Grimacing;Sore Pain Intervention(s): Limited activity within patient's tolerance;Monitored during session;Repositioned   Frequency  Min 2X/week        Progress Toward Goals  OT Goals(current goals can now be found in the care plan section)  Progress towards OT goals: Progressing toward goals  Acute Rehab OT Goals Patient Stated Goal: to go home OT Goal Formulation: With patient Time For Goal Achievement: 10/06/19 Potential to Achieve Goals: Good ADL Goals Pt Will Perform Grooming: with mod assist;sitting;with adaptive equipment (c LRAD PRN ) Pt Will Perform Lower Body Dressing: sitting/lateral leans;with adaptive equipment;with mod assist (c LRAD PRN) Pt Will Transfer to Toilet: bedside commode;squat pivot transfer;with mod assist;with +2 assist (c LRAD PRN  ) Pt Will Perform Toileting - Clothing Manipulation and hygiene: sitting/lateral leans;with mod assist (c LRAD PRN )  Plan Discharge plan remains appropriate;Frequency remains appropriate    Co-evaluation    PT/OT/SLP Co-Evaluation/Treatment: Yes Reason for Co-Treatment: Complexity of the patient's impairments (multi-system involvement);For patient/therapist safety;To address functional/ADL transfers   OT goals addressed during session: ADL's and self-care;Strengthening/ROM      AM-PAC OT "6 Clicks" Daily Activity     Outcome Measure   Help from another person eating meals?: A Little Help from another person taking care of personal grooming?: A Lot Help from another person toileting, which includes using toliet, bedpan, or urinal?: A Lot Help from another person bathing (including washing, rinsing, drying)?: A Lot Help from another person to put on and taking off regular upper body clothing?: A Lot Help from another person to put on and taking off regular lower body clothing?: A Lot 6 Click Score: 13    End of Session Equipment Utilized During Treatment: Gait belt;Rolling walker  OT Visit Diagnosis: Muscle weakness (generalized) (M62.81);Unsteadiness on feet (R26.81);Hemiplegia and hemiparesis;Pain Hemiplegia - Right/Left: Left Hemiplegia - dominant/non-dominant: Non-Dominant Hemiplegia - caused by: Cerebral infarction Pain - Right/Left: Left Pain - part of body: Shoulder   Activity Tolerance Patient tolerated treatment well   Patient Left in bed;with call bell/phone within reach;with family/visitor present   Nurse Communication Mobility status        Time: 6270-3500 OT Time Calculation (min): 29 min  Charges: OT General Charges $OT Visit: 1 Visit OT Treatments $Therapeutic Activity: 8-22 mins  Kathie Dike,  M.S. OTR/L  09/24/19, 10:42 AM  ascom [phone]

## 2019-09-25 DIAGNOSIS — N3001 Acute cystitis with hematuria: Secondary | ICD-10-CM | POA: Diagnosis not present

## 2019-09-25 LAB — GLUCOSE, CAPILLARY
Glucose-Capillary: 170 mg/dL — ABNORMAL HIGH (ref 70–99)
Glucose-Capillary: 233 mg/dL — ABNORMAL HIGH (ref 70–99)
Glucose-Capillary: 239 mg/dL — ABNORMAL HIGH (ref 70–99)
Glucose-Capillary: 226 mg/dL — ABNORMAL HIGH (ref 70–99)

## 2019-09-25 NOTE — Progress Notes (Signed)
Physical Therapy Treatment Patient Details Name: [name] MRN: [id number] DOB: 04/27/1933 Today's Date: 09/25/2019    History of Present Illness [name] is a 86 y.o. female past medical history of diabetes, diabetic polyneuropathy, hyperlipidemia, hypertension presented to the emergency room 09/20/2019 for dizziness and increased urinary frequency. She was admitted and treated for possible UTI, with antibiotics. She was also found to be confused, and her SBP was markedly elevated, which also required treatment.  This was managed with antihypertensives. On 09/22/19 pt was noted to have markedly decreased LLE/LUE functional movement and code stroke was called. F/u imaging showed Stable left basal ganglia infarct as well as "Area of decreased attenuation in the right half of the pons which may represent an evolving infarct".    PT Comments    Pt ready for session.  Participated in exercises as described below.  She is able to transition to sitting with mod a x 1 to sit but once sitting she is able to sit without direct support.  She does have some occasional post lean/LOB with ex but is able to correct on her own or with only verbal cues.  She does endorse some increased dizziness with sitting and vision being "blurry"  BP taken and documented in flow sheets.  Diastolic 93.  She returns to supine with max a x 1 and repositioned to comfort and she reports relief of symptoms.  She continues to demonstrate decreased strength and ROM L side consistent with L CVA symptoms.   Follow Up Recommendations  SNF     Equipment Recommendations  None recommended by PT    Recommendations for Other Services       Precautions / Restrictions Precautions Precautions: Fall Precaution Comments: be protective of L shoulder to prevent subluxation    Mobility  Bed Mobility Overal bed mobility: Needs Assistance Bed Mobility: Supine to Sit;Sit to Supine Rolling: Mod assist   Supine to sit: Mod  assist Sit to supine: Max assist   General bed mobility comments: able to sit with +1 today,  Transfers                 General transfer comment: deferred - unable to coordinate care for +2 assist needed for safe attempts.  Ambulation/Gait             General Gait Details: Not able to attempt   Stairs             Wheelchair Mobility    Modified Rankin (Stroke Patients Only)       Balance Overall balance assessment: Needs assistance Sitting-balance support: Feet supported;Single extremity supported Sitting balance-Leahy Scale: Fair Sitting balance - Comments: Patient is able to sit without PT support x 5 minutes.  some post lean but able to correct on her own Postural control: Posterior lean                                  Cognition Arousal/Alertness: Awake/alert Behavior During Therapy: WFL for tasks assessed/performed Overall Cognitive Status: Within Functional Limits for tasks assessed                                 General Comments: More aware today and orientated.      Exercises Other Exercises Other Exercises: supine AROM R, AAROM LLE x 10 - ankle pumps, SLR, heel slides, ab/add.  LUE PROM for wrist elbow and shoulder ranges.    General Comments        Pertinent Vitals/Pain Pain Assessment: Faces Faces Pain Scale: Hurts little more Pain Location: L thumb/hand, shoulder Pain Descriptors / Indicators: Discomfort;Grimacing;Sore Pain Intervention(s): Limited activity within patient's tolerance;Monitored during session;Repositioned    Home Living                      Prior Function            PT Goals (current goals can now be found in the care plan section) Progress towards PT goals: Progressing toward goals    Frequency    7X/week      PT Plan Current plan remains appropriate    Co-evaluation              AM-PAC PT "6 Clicks" Mobility   Outcome Measure  Help needed turning  from your back to your side while in a flat bed without using bedrails?: A Lot Help needed moving from lying on your back to sitting on the side of a flat bed without using bedrails?: A Lot Help needed moving to and from a bed to a chair (including a wheelchair)?: A Lot Help needed standing up from a chair using your arms (e.g., wheelchair or bedside chair)?: A Lot Help needed to walk in hospital room?: Total Help needed climbing 3-5 steps with a railing? : Total 6 Click Score: 10    End of Session Equipment Utilized During Treatment: Gait belt Activity Tolerance: Patient tolerated treatment well Patient left: in bed;with call bell/phone within reach;with bed alarm set;with nursing/sitter in room Nurse Communication: Mobility status Hemiplegia - Right/Left: Left Hemiplegia - dominant/non-dominant: Non-dominant Hemiplegia - caused by: Cerebral infarction Pain - Right/Left: Left Pain - part of body: Shoulder     Time: 8299-3716 PT Time Calculation (min) (ACUTE ONLY): 24 min  Charges:  $Therapeutic Exercise: 8-22 mins $Therapeutic Activity: 8-22 mins                    Danielle Dess, PTA 09/25/19, 9:31 AM

## 2019-09-25 NOTE — Progress Notes (Signed)
PROGRESS NOTE    [name]  MWU:[id number] DOB: 1934/01/07 DOA: 09/20/2019 PCP: Gracelyn Nurse, MD   Brief Narrative: 86 year old past medical history significant for hypertension, hyperlipidemia, diverticulitis, diabetes who presents with acute metabolic encephalopathy, unsteady gait, frequent urination and right thigh discomfort for the last few days.  Patient presented with hypertensive crisis, UA positive for UTI, Kovic 19 test negative.  CT head negative for acute intracranial abnormality.  Chest x-ray; no acute cardiopulmonary disease  Assessment & Plan:   Principal Problem:   UTI (urinary tract infection) Active Problems:   Type 2 diabetes mellitus with polyneuropathy (HCC)   Mixed hyperlipidemia   Essential hypertension   1-Left side weakness; Acute pontine stroke. Small vessel diseases.  Patient complained of left arm and leg pain. She was not able to move left side, due to pain and weakness. Patient reported she wake up with this symptoms, left side pain and weakness. I discussed with patient's Daughter; patient was able to move left side prior to admission. I consulted Neurology. Plan was for activation of Code stroke.  -Not TPA candidate, unknown time of symptoms.  -CT head ordered stat: Chronic white matter ischemic change. Stable left basal ganglia Infarct. Area of decreased attenuation in the right half of the pons which may represent an evolving infarct. Follow-up imaging may be helpful. -Aspirin increase to 325 mg. Continue with statins.  -Stroke order set implemented.  -CTA; No acute large or medium vessel occlusion. Atherosclerotic disease at both carotid bifurcations but without ICA stenosis. Severe bilateral external carotid artery stenoses.Moderate to severe stenosis of the left MCA M1 segment. Moderate stenosis of both posterior cerebral arteries at the P1 P2 junction regions. Stenosis of the distal right vertebral artery just proximal to the basilar  artery. -permissive HTN.  She will need rehab.  Plan for aspirin 325 mg plus plavix for three months then plavix alone.  Repeated ct head stable.   Acute metabolic encephalopathy likely secondary to UTI Patient is alert and oriented. UA with 21-50 white blood cells. Treated with  IV ceftriaxone. Plan for 3 days of IV antibiotics.  CT head negative for acute abnormality. Unable to perform MRI due to foreign body eye  Urine culture multiple species present  Hypertensive crisis, uncontrolled hypertension: Chest x-ray negative. Doppler lower extremity negative for DVT Permissive HTN due to stroke. Started low dose lisinopril.  Echo; Normal EF  Dizziness/unsteady gait: CT negative. Patient unable to get MRI B12 low normal; 227. Started supplement.  PT eval  Diabetes type 2 Continue with a sliding scale insulin  Hyperlipidemia continue with statins Hypokalemia; replete orally.  Left arm pain; x ray: chronic rotator cough tear. Will need to follow up with ortho out patient.  voltaren gel ordered.   Estimated body mass index is 32.81 kg/m as calculated from the following:   Height as of this encounter: 5' (1.524 m).   Weight as of this encounter: 76.2 kg.   DVT prophylaxis: Lovenox Code Status: Full code Family Communication: Son 8/14 Disposition Plan:  Status is: inpatient.    Dispo: The patient is from: Home              Anticipated d/c is to: SNF              Anticipated d/c date is: 2 days              Patient currently is not medically stable to d/c.  Patient with left side weakness, likely stroke. Needs SNF  Consultants:   None  Procedures:   Echo  Antimicrobials:  Ceftriaxone  Subjective: She is able to move left LE better.  Still with left arm pain.  Unable to move left arm  Objective: Vitals:   09/24/19 2055 09/25/19 0103 09/25/19 0352 09/25/19 0853  BP: (!) 139/108 (!) 143/59 (!) 157/57 (!) 145/93  Pulse: 72 63 (!) 59 65  Resp: 18 17  15    Temp: 98.4 F (36.9 C) 97.8 F (36.6 C) 97.9 F (36.6 C) 97.9 F (36.6 C)  TempSrc: Oral Oral Oral Oral  SpO2: 97% 100% 98% 97%  Weight:      Height:        Intake/Output Summary (Last 24 hours) at 09/25/2019 1328 Last data filed at 09/25/2019 0900 Gross per 24 hour  Intake 360 ml  Output 500 ml  Net -140 ml   Filed Weights   09/19/19 2024  Weight: 76.2 kg    Examination:  General exam: NAD Respiratory system: CTA Cardiovascular system: S 1, S 2 RRR Gastrointestinal system: BS present, soft, nt Central nervous system: alert, LE extremity 0/5, LE left 3/5 Extremities: No edema  Data Reviewed: I have personally reviewed following labs and imaging studies  CBC: Recent Labs  Lab 09/19/19 2029 09/21/19 0459 09/23/19 0455  WBC 10.1 8.6 9.3  HGB 12.2 11.4* 11.4*  HCT 38.1 35.9* 34.3*  MCV 97.7 97.8 94.0  PLT 242 191 196   Basic Metabolic Panel: Recent Labs  Lab 09/19/19 2029 09/21/19 0459 09/23/19 0455 09/24/19 0743  NA 139 142 138 137  K 4.4 4.0 3.4* 3.8  CL 103 106 100 104  CO2 28 29 27 25   GLUCOSE 234* 145* 146* 179*  BUN 15 17 13 11   CREATININE 0.75 0.69 0.62 0.59  CALCIUM 9.3 8.9 8.8* 8.4*   GFR: Estimated Creatinine Clearance: 48.6 mL/min (by C-G formula based on SCr of 0.59 mg/dL). Liver Function Tests: No results for input(s): AST, ALT, ALKPHOS, BILITOT, PROT, ALBUMIN in the last 168 hours. No results for input(s): LIPASE, AMYLASE in the last 168 hours. No results for input(s): AMMONIA in the last 168 hours. Coagulation Profile: No results for input(s): INR, PROTIME in the last 168 hours. Cardiac Enzymes: No results for input(s): CKTOTAL, CKMB, CKMBINDEX, TROPONINI in the last 168 hours. BNP (last 3 results) No results for input(s): PROBNP in the last 8760 hours. HbA1C: Recent Labs    09/23/19 0455  HGBA1C 7.8*   CBG: Recent Labs  Lab 09/24/19 1251 09/24/19 1646 09/24/19 2058 09/25/19 0802 09/25/19 1155  GLUCAP 267* 224*  186* 170* 233*   Lipid Profile: Recent Labs    09/23/19 0455  CHOL 150  HDL 45  LDLCALC 86  TRIG 94  CHOLHDL 3.3   Thyroid Function Tests: No results for input(s): TSH, T4TOTAL, FREET4, T3FREE, THYROIDAB in the last 72 hours. Anemia Panel: No results for input(s): VITAMINB12, FOLATE, FERRITIN, TIBC, IRON, RETICCTPCT in the last 72 hours. Sepsis Labs: No results for input(s): PROCALCITON, LATICACIDVEN in the last 168 hours.  Recent Results (from the past 240 hour(s))  Urine culture     Status: Abnormal   Collection Time: 09/19/19  8:29 PM   Specimen: Urine, Random  Result Value Ref Range Status   Specimen Description   Final    URINE, RANDOM Performed at [facility], [address] [place], [place] [address]    Special Requests   Final    NONE Performed at [facility], [address]  [place], [place] [address]    Culture MULTIPLE SPECIES PRESENT, SUGGEST RECOLLECTION (A)  Final   Report Status 09/21/2019 FINAL  Final  SARS Coronavirus 2 by RT PCR (hospital order, performed in [facility] hospital lab) Nasopharyngeal Nasopharyngeal Swab     Status: None   Collection Time: 09/20/19  9:04 AM   Specimen: Nasopharyngeal Swab  Result Value Ref Range Status   SARS Coronavirus 2 NEGATIVE NEGATIVE Final    Comment: (NOTE) SARS-CoV-2 target nucleic acids are NOT DETECTED.  The SARS-CoV-2 RNA is generally detectable in upper and lower respiratory specimens during the acute phase of infection. The lowest concentration of SARS-CoV-2 viral copies this assay can detect is 250 copies / mL. A negative result does not preclude SARS-CoV-2 infection and should not be used as the sole basis for treatment or other patient management decisions.  A negative result may occur with improper specimen collection / handling, submission of specimen other than nasopharyngeal swab, presence of viral mutation(s) within the areas targeted by this assay, and inadequate number of  viral copies (<250 copies / mL). A negative result must be combined with clinical observations, patient history, and epidemiological information.  Fact Sheet for Patients:   BoilerBrush.com.cy  Fact Sheet for Healthcare Providers: https://pope.com/  This test is not yet approved or  cleared by the [place] FDA and has been authorized for detection and/or diagnosis of SARS-CoV-2 by FDA under an Emergency Use Authorization (EUA).  This EUA will remain in effect (meaning this test can be used) for the duration of the COVID-19 declaration under Section 564(b)(1) of the Act, 21 U.S.C. section 360bbb-3(b)(1), unless the authorization is terminated or revoked sooner.  Performed at [facility], [address] [place], [place] [address]   SARS CORONAVIRUS 2 (TAT 6-24 HRS) Nasopharyngeal Nasopharyngeal Swab     Status: None   Collection Time: 09/23/19  1:26 PM   Specimen: Nasopharyngeal Swab  Result Value Ref Range Status   SARS Coronavirus 2 NEGATIVE NEGATIVE Final    Comment: (NOTE) SARS-CoV-2 target nucleic acids are NOT DETECTED.  The SARS-CoV-2 RNA is generally detectable in upper and lower respiratory specimens during the acute phase of infection. Negative results do not preclude SARS-CoV-2 infection, do not rule out co-infections with other pathogens, and should not be used as the sole basis for treatment or other patient management decisions. Negative results must be combined with clinical observations, patient history, and epidemiological information. The expected result is Negative.  Fact Sheet for Patients: HairSlick.no  Fact Sheet for Healthcare Providers: quierodirigir.com  This test is not yet approved or cleared by the [place] FDA and  has been authorized for detection and/or diagnosis of SARS-CoV-2 by FDA under an Emergency Use Authorization  (EUA). This EUA will remain  in effect (meaning this test can be used) for the duration of the COVID-19 declaration under Se ction 564(b)(1) of the Act, 21 U.S.C. section 360bbb-3(b)(1), unless the authorization is terminated or revoked sooner.  Performed at [facility], [address], [place], [place] [address]          Radiology Studies: DG Hand 2 View Left  Result Date: 09/24/2019 CLINICAL DATA:  Left hand and wrist pain.  No injury. EXAM: LEFT HAND - 2 VIEW COMPARISON:  None. FINDINGS: Moderate degenerative changes over the radiocarpal joint and distal radioulnar joints. Suggestion of an old ulnar styloid fracture. Degenerative change of the carpal bones and first carpometacarpal joints. Mild degenerative change throughout the interphalangeal joints and  minimal degenerative change involving the MCP joints. No definite bony erosions. Alignment and mineralization is normal. Small vessel atherosclerotic disease. IMPRESSION: 1. No acute findings. 2. Moderate degenerative changes as described.  No erosive changes. Electronically Signed   By: Elberta Fortis M.D.   On: 09/24/2019 14:24   DG Shoulder Left  Result Date: 09/24/2019 CLINICAL DATA:  Left shoulder pain.  No injury. EXAM: LEFT SHOULDER - 2+ VIEW COMPARISON:  None. FINDINGS: There is moderate degenerative change of the glenohumeral joint and AC joints. Significant decreased acromial humeral joint space. No acute fracture or dislocation. IMPRESSION: 1. No acute findings. 2. Moderate degenerative changes of the glenohumeral joint and AC joints. Decreased acromial humeral joint space suggesting chronic rotator cuff tear. Electronically Signed   By: Elberta Fortis M.D.   On: 09/24/2019 14:21        Scheduled Meds: . acetaminophen  650 mg Oral TID  . aspirin EC  325 mg Oral Daily  . atorvastatin  80 mg Oral Daily  . clopidogrel  75 mg Oral Daily  . diclofenac Sodium  2 g Topical BID  . enoxaparin (LOVENOX) injection  40 mg  Subcutaneous Q24H  . insulin aspart  0-5 Units Subcutaneous QHS  . insulin aspart  0-9 Units Subcutaneous TID WC  . lisinopril  10 mg Oral Daily  . vitamin B-12  100 mcg Oral Daily   Continuous Infusions:    LOS: 3 days    Time spent:35 minutes.     Alba Cory, MD [facility]   If 7PM-7AM, please contact night-coverage [email or web address]  09/25/2019, 1:28 PM

## 2019-09-25 NOTE — Plan of Care (Signed)
  Problem: Education: Goal: Knowledge of General Education information will improve Description Including pain rating scale, medication(s)/side effects and non-pharmacologic comfort measures Outcome: Progressing   

## 2019-09-26 LAB — GLUCOSE, CAPILLARY
Glucose-Capillary: 162 mg/dL — ABNORMAL HIGH (ref 70–99)
Glucose-Capillary: 207 mg/dL — ABNORMAL HIGH (ref 70–99)
Glucose-Capillary: 171 mg/dL — ABNORMAL HIGH (ref 70–99)
Glucose-Capillary: 410 mg/dL — ABNORMAL HIGH (ref 70–99)
Glucose-Capillary: 124 mg/dL — ABNORMAL HIGH (ref 70–99)

## 2019-09-26 LAB — BASIC METABOLIC PANEL
Anion gap: 12 (ref 5–15)
BUN: 14 mg/dL (ref 8–23)
CO2: 26 mmol/L (ref 22–32)
Calcium: 9 mg/dL (ref 8.9–10.3)
Chloride: 102 mmol/L (ref 98–111)
Creatinine, Ser: 0.58 mg/dL (ref 0.44–1.00)
GFR calc Af Amer: >60 mL/min (ref >60)
GFR calc non Af Amer: >60 mL/min (ref >60)
Glucose, Bld: 220 mg/dL — ABNORMAL HIGH (ref 70–99)
Potassium: 3.7 mmol/L (ref 3.5–5.1)
Sodium: 140 mmol/L (ref 135–145)

## 2019-09-26 MED ORDER — CLOPIDOGREL BISULFATE 75 MG PO TABS: 75.0000 mg | ORAL_TABLET | Freq: Every day | ORAL | 3 refills | Status: DC

## 2019-09-26 MED ORDER — CYANOCOBALAMIN 100 MCG PO TABS: 100.0000 ug | ORAL_TABLET | Freq: Every day | ORAL | 0 refills | Status: DC

## 2019-09-26 MED ORDER — ATORVASTATIN CALCIUM 80 MG PO TABS: 80.0000 mg | ORAL_TABLET | Freq: Every day | ORAL | 0 refills | Status: DC

## 2019-09-26 MED ORDER — ASPIRIN 325 MG PO TBEC: 325.0000 mg | DELAYED_RELEASE_TABLET | Freq: Every day | ORAL | 0 refills | Status: DC

## 2019-09-26 NOTE — TOC Progression Note (Signed)
Transition of Care [facility]) - Progression Note    Patient Details  Name: [name] MRN: [id number] Date of Birth: [birth year]  [facility] CM/SW Contact  Allayne Butcher, RN Phone Number: 09/26/2019, 3:29 PM  Clinical Narrative:    EMS has been arranged by this RNCM, patient is 5th on the list for pick up.  RNCM was able to reach patient's son [name] and he and his sister Shaune Pascal are aware of DC.  Expected Discharge Plan: Home w Home Health Services Barriers to Discharge: Barriers Resolved  Expected Discharge Plan and Services Expected Discharge Plan: Home w Home Health Services   Discharge Planning Services: CM Consult Post Acute Care Choice: Home Health Living arrangements for the past 2 months: Single Family Home Expected Discharge Date: 09/26/19                         HH Arranged: PT, OT [facility] Date HH Agency Contacted: 09/21/19 Time HH Agency Contacted: 1050 Representative spoke with at [facility] Agency: Mellissa Kohut   Social Determinants of Health (SDOH) Interventions    Readmission Risk Interventions No flowsheet data found.

## 2019-09-26 NOTE — Progress Notes (Signed)
Physical Therapy Treatment Patient Details Name: [name] MRN: [id number] DOB: 1933-07-10 Today's Date: 09/26/2019    History of Present Illness [name] is a 86 y.o. female past medical history of diabetes, diabetic polyneuropathy, hyperlipidemia, hypertension presented to the emergency room 09/20/2019 for dizziness and increased urinary frequency. She was admitted and treated for possible UTI, with antibiotics. She was also found to be confused, and her SBP was markedly elevated, which also required treatment.  This was managed with antihypertensives. On 09/22/19 pt was noted to have markedly decreased LLE/LUE functional movement and code stroke was called. F/u imaging showed Stable left basal ganglia infarct as well as "Area of decreased attenuation in the right half of the pons which may represent an evolving infarct".    PT Comments    Remains motivated in session.  Participated in exercises as described below.  She is able to sit x 20 minutes today with only one post LOB that required assist.  Session focused on sitting balance, lateral scoots and reaching outside BOS which she remains hesitant to do.  Overall increased tolerance for activity today with some dizziness reported at end of 20 minutes.  Returned to supine with max a x 1.   Follow Up Recommendations  SNF     Equipment Recommendations  None recommended by PT    Recommendations for Other Services       Precautions / Restrictions Precautions Precautions: Fall Precaution Comments: be protective of L shoulder to prevent subluxation    Mobility  Bed Mobility Overal bed mobility: Needs Assistance Bed Mobility: Supine to Sit;Sit to Supine Rolling: Mod assist   Supine to sit: Mod assist Sit to supine: Max assist      Transfers                Lateral/Scoot Transfers: Mod assist;Max assist General transfer comment: lateral scoot x 4 towards HOB with coordenated movements and use of pad to assist  in sliding  Ambulation/Gait Ambulation/Gait assistance: Min guard           General Gait Details: Not able to attempt   Stairs             Wheelchair Mobility    Modified Rankin (Stroke Patients Only)       Balance Overall balance assessment: Needs assistance Sitting-balance support: Feet supported;Single extremity supported Sitting balance-Leahy Scale: Fair Sitting balance - Comments: Patient is able to sit without PT support  x 20 minutes.  some post lean but able to correct on her own but did need assist x 1 due to post LOB Postural control: Posterior lean                                  Cognition Arousal/Alertness: Awake/alert Behavior During Therapy: WFL for tasks assessed/performed Overall Cognitive Status: Within Functional Limits for tasks assessed                                 General Comments: More aware today and orientated.      Exercises Other Exercises Other Exercises: supine AROM R, AAROM LLE x 10 - ankle pumps, SLR, heel slides, ab/add.  LUE PROM for wrist elbow and shoulder ranges. Other Exercises: sitting x 20 minutes    General Comments        Pertinent Vitals/Pain Pain Assessment: Faces Faces Pain Scale: Hurts  a little bit Pain Location: L thumb/hand, shoulder Pain Descriptors / Indicators: Discomfort;Grimacing;Sore Pain Intervention(s): Limited activity within patient's tolerance;Monitored during session;Repositioned    Home Living                      Prior Function            PT Goals (current goals can now be found in the care plan section) Progress towards PT goals: Progressing toward goals    Frequency    7X/week      PT Plan Current plan remains appropriate    Co-evaluation              AM-PAC PT "6 Clicks" Mobility   Outcome Measure  Help needed turning from your back to your side while in a flat bed without using bedrails?: A Lot Help needed moving from lying  on your back to sitting on the side of a flat bed without using bedrails?: A Lot Help needed moving to and from a bed to a chair (including a wheelchair)?: A Lot Help needed standing up from a chair using your arms (e.g., wheelchair or bedside chair)?: A Lot Help needed to walk in hospital room?: Total Help needed climbing 3-5 steps with a railing? : Total 6 Click Score: 10    End of Session   Activity Tolerance: Patient tolerated treatment well Patient left: in bed;with call bell/phone within reach;with bed alarm set;with nursing/sitter in room Nurse Communication: Mobility status Hemiplegia - Right/Left: Left Hemiplegia - dominant/non-dominant: Non-dominant Hemiplegia - caused by: Cerebral infarction Pain - Right/Left: Left Pain - part of body: Shoulder     Time: 7262-0355 PT Time Calculation (min) (ACUTE ONLY): 35 min  Charges:  $Therapeutic Exercise: 8-22 mins $Therapeutic Activity: 8-22 mins                    Danielle Dess, PTA 09/26/19, 10:41 AM

## 2019-09-26 NOTE — TOC Progression Note (Signed)
Transition of Care [facility]) - Progression Note    Patient Details  Name: [name] MRN: [id number] Date of Birth: 1933/09/08  [facility]) CM/SW Contact  Allayne Butcher, RN Phone Number: 09/26/2019, 2:50 PM  Clinical Narrative:    Attempted to call patient's daughter for update on discharge but she has no voice mail set up.  Will attempt again later.    Expected Discharge Plan: Home w Home Health Services Barriers to Discharge: Barriers Resolved  Expected Discharge Plan and Services Expected Discharge Plan: Home w Home Health Services   Discharge Planning Services: CM Consult Post Acute Care Choice: Home Health Living arrangements for the past 2 months: Single Family Home Expected Discharge Date: 09/26/19                         HH Arranged: PT, OT [facility] Date HH Agency Contacted: 09/21/19 Time HH Agency Contacted: 1050 Representative spoke with at [facility] Agency: Mellissa Kohut   Social Determinants of Health (SDOH) Interventions    Readmission Risk Interventions No flowsheet data found.

## 2019-09-26 NOTE — Discharge Summary (Signed)
Physician Discharge Summary  ERCIA CRISAFULLI ZOX:[id number] DOB: May 26, 1933 DOA: 09/20/2019  PCP: Gracelyn Nurse, MD  Admit date: 09/20/2019 Discharge date: 09/26/2019  Admitted From: Home Disposition:  SNF  Recommendations for Outpatient Follow-up:  1. Follow up with PCP in 1-2 weeks 2. Please obtain BMP/CBC in one week 3. Needs follow up with orthopedic for rotator cuff tear.  4. Needs to follow up with neurology after stroke 5. Needs aspirin 325 mg and plavix for 3 month,then plavix alone./    Discharge Condition: Stable CODE STATUS: Full code Diet recommendation: Carb Modified   Brief/Interim Summary: 86 year old past medical history significant for hypertension, hyperlipidemia, diverticulitis, diabetes who presents with acute metabolic encephalopathy, unsteady gait, frequent urination and right thigh discomfort for the last few days.  Patient presented with hypertensive crisis, UA positive for UTI, Kovic 19 test negative.  CT head negative for acute intracranial abnormality.  Chest x-ray; no acute cardiopulmonary disease   1-Left side weakness; Acute pontine stroke. Small vessel diseases.  Patient complained of left arm and leg pain. She was not able to move left side, due to pain and weakness. Patient reported she wake up with this symptoms, left side pain and weakness. I discussed with patient's Daughter; patient was able to move left side prior to admission. I consulted Neurology. Plan was for activation of Code stroke.  -Not TPA candidate, unknown time of symptoms.  -CT head ordered stat: Chronic white matter ischemic change. Stable left basal ganglia Infarct. Area of decreased attenuation in the right half of the pons which may represent an evolving infarct. Follow-up imaging may be helpful. -Aspirin increase to 325 mg. Continue with statins.  -Stroke order set implemented.  -CTA; No acute large or medium vessel occlusion. Atherosclerotic disease at both carotid  bifurcations but without ICA stenosis. Severe bilateral external carotid artery stenoses.Moderate to severe stenosis of the left MCA M1 segment. Moderate stenosis of both posterior cerebral arteries at the P1 P2 junction regions. Stenosis of the distal right vertebral artery just proximal to the basilar artery. -permissive HTN.  She will need rehab.  Plan for aspirin 325 mg plus plavix for three months then plavix alone.  Repeated ct head stable.   Acute metabolic encephalopathy likely secondary to UTI Patient is alert and oriented. UA with 21-50 white blood cells. Treated with  IV ceftriaxone. Plan for 3 days of IV antibiotics.  CT head negative for acute abnormality. Unable to perform MRI due to foreign body eye  Urine culture multiple species present  Hypertensive crisis, uncontrolled hypertension: Chest x-ray negative. Doppler lower extremity negative for DVT Permissive HTN due to stroke. Started low dose lisinopril.  Echo; Normal EF Continue with lisinopril, resume HCTZ.  Plan to start controlling BP  Dizziness/unsteady gait: CT negative. Patient unable to get MRI B12 low normal; 227. Started supplement.  PT eval  Diabetes type 2 Continue with a sliding scale insulin Resume home meds  Hyperlipidemia continue with statins  Hypokalemia; replete orally.   Left arm pain; x ray: chronic rotator cough tear. Will need to follow up with ortho out patient.  voltaren gel ordered.  Follow up with Dr Joice Lofts orthopedic n 3 weeks.    Discharge Diagnoses:  Principal Problem:   UTI (urinary tract infection) Active Problems:   Type 2 diabetes mellitus with polyneuropathy (HCC)   Mixed hyperlipidemia   Essential hypertension    Discharge Instructions  Discharge Instructions    Diet - low sodium heart healthy   Complete by: As directed  Increase activity slowly   Complete by: As directed      Allergies as of 09/26/2019   No Known Allergies     Medication List     STOP taking these medications   naproxen sodium 220 MG tablet Commonly known as: ALEVE   simvastatin 40 MG tablet Commonly known as: ZOCOR     TAKE these medications   acetaminophen 325 MG tablet Commonly known as: TYLENOL Take 2 tablets (650 mg total) by mouth every 6 (six) hours as needed for mild pain, moderate pain, fever or headache.   aspirin 325 MG EC tablet Take 1 tablet (325 mg total) by mouth daily. What changed:   medication strength  how much to take   atorvastatin 80 MG tablet Commonly known as: LIPITOR Take 1 tablet (80 mg total) by mouth daily.   clopidogrel 75 MG tablet Commonly known as: PLAVIX Take 1 tablet (75 mg total) by mouth daily.   cyanocobalamin 100 MCG tablet Take 1 tablet (100 mcg total) by mouth daily.   glimepiride 4 MG tablet Commonly known as: AMARYL Take 4 mg by mouth daily with breakfast.   hydrochlorothiazide 12.5 MG tablet Commonly known as: HYDRODIURIL Take 12.5 mg by mouth daily.   lisinopril 20 MG tablet Commonly known as: ZESTRIL Take by mouth.   metFORMIN 1000 MG tablet Commonly known as: GLUCOPHAGE Take 1,000 mg by mouth 2 (two) times daily with a meal.            Durable Medical Equipment  (From admission, onward)         Start     Ordered   09/23/19 1211  For home use only DME 3 n 1  Once        09/23/19 1210   09/22/19 0721  For home use only DME 3 n 1  Once        09/22/19 0720          Contact information for follow-up providers    Gracelyn Nurse, MD Follow up in 1 week(s).   Specialty: Internal Medicine Contact information: [address] [place] [address] [phone]        Christena Flake, MD Follow up in 3 week(s).   Specialty: Orthopedic Surgery Why: please call to arrange follow up Contact information: [address] [facility] [place] [address] [phone]            Contact information for after-discharge care    Destination     [facility] .   Service: Skilled Nursing Contact information: [address] [place] [address] [phone]                 No Known Allergies  Consultations:  Neurology     Procedures/Studies: CT ANGIO HEAD W OR WO CONTRAST  Result Date: 09/22/2019 CLINICAL DATA:  New onset left-sided weakness. EXAM: CT ANGIOGRAPHY HEAD AND NECK TECHNIQUE: Multidetector CT imaging of the head and neck was performed using the standard protocol during bolus administration of intravenous contrast. Multiplanar CT image reconstructions and MIPs were obtained to evaluate the vascular anatomy. Carotid stenosis measurements (when applicable) are obtained utilizing NASCET criteria, using the distal internal carotid diameter as the denominator. CONTRAST:  75mL OMNIPAQUE IOHEXOL 350 MG/ML SOLN COMPARISON:  Head CT same day FINDINGS: CTA NECK FINDINGS Aortic arch: Aortic atherosclerosis. Branching pattern is normal without origin stenosis. Right carotid system: Common carotid artery is tortuous but widely patent to the  bifurcation. Calcified plaque at the carotid bifurcation and ICA bulb. Proximal ICA is markedly tortuous. No stenosis however. External carotid artery shows severe stenosis. Cervical ICA widely patent beyond that. Left carotid system: Common carotid artery widely patent to the bifurcation. Atherosclerotic calcification at the carotid bifurcation but without ICA stenosis. External carotid artery shows severe stenosis. Vertebral arteries: Both vertebral artery origins are patent. Both vertebral arteries are patent through the cervical region to the foramen magnum. Skeleton: Chronic fusion throughout the cervical region. Osteophytic narrowing of the canal at C6-7. Other neck: No mass or lymphadenopathy. Upper chest: Negative Review of the MIP images confirms the above findings CTA HEAD FINDINGS Anterior circulation: Both internal carotid arteries are patent  through the skull base and siphon regions. Siphon atherosclerotic calcification but without stenosis greater than 30%. The anterior and middle cerebral vessels are patent without aneurysm or vascular malformation. There is moderate to severe stenosis of the left M1 segment. No large or medium vessel occlusion. Posterior circulation: Both vertebral arteries are patent through the foramen magnum. Posteroinferior cerebellar arteries show flow. Both vertebral arteries reach the basilar. Stenosis of the distal right vertebral artery just proximal to the basilar artery. The left vertebral artery is dominant. No basilar stenosis. Superior cerebellar and posterior cerebral arteries are patent. Moderate stenosis of both posterior cerebral arteries at the P1 P2 junction regions. Venous sinuses: Patent and normal. Anatomic variants: None significant. Review of the MIP images confirms the above findings IMPRESSION: 1. No acute large or medium vessel occlusion. 2. Atherosclerotic disease at both carotid bifurcations but without ICA stenosis. Severe bilateral external carotid artery stenoses. 3. Moderate to severe stenosis of the left MCA M1 segment. 4. Moderate stenosis of both posterior cerebral arteries at the P1 P2 junction regions. 5. Stenosis of the distal right vertebral artery just proximal to the basilar artery. Aortic Atherosclerosis (ICD10-I70.0). Electronically Signed   By: Paulina Fusi M.D.   On: 09/22/2019 11:40   CT HEAD WO CONTRAST  Result Date: 09/23/2019 CLINICAL DATA:  Left-sided weakness EXAM: CT HEAD WITHOUT CONTRAST TECHNIQUE: Contiguous axial images were obtained from the base of the skull through the vertex without intravenous contrast. COMPARISON:  September 22, 2019 FINDINGS: Brain: Mild diffuse atrophy is stable. There is no evident intracranial mass, hemorrhage, extra-axial fluid collection, or midline shift. Stable appearing infarct involving portions of the right pons and midbrain. Stable appearing  prior infarct involving portions of the anterior limbs of the internal and external capsule on the left. There is stable patchy small vessel disease in the centra semiovale bilaterally. No new infarct evident compared to 1 day prior. Vascular: No hyperdense vessel. There is calcification in each carotid siphon region. Skull: The bony calvarium appears intact. Sinuses/Orbits: There is mucosal thickening in several ethmoid air cells. Other visualized paranasal sinuses are clear. No intraorbital lesions. A metallic foreign body is again noted along the medial right preseptal orbital region. Other: Mastoid air cells are clear. IMPRESSION: Prior anterior left basal ganglia and right pons/midbrain infarcts, stable. Stable patchy periventricular small vessel disease. No mass or hemorrhage. No acute acute infarct compared to 1 day prior evident. There are foci of arterial vascular calcification. Metallic foreign body in the medial preseptal right orbital region is stable. Mucosal thickening noted in several ethmoid air cells. Electronically Signed   By: Bretta Bang III M.D.   On: 09/23/2019 10:17   CT HEAD WO CONTRAST  Result Date: 09/22/2019 CLINICAL DATA:  New onset left-sided weakness EXAM: CT HEAD WITHOUT CONTRAST  TECHNIQUE: Contiguous axial images were obtained from the base of the skull through the vertex without intravenous contrast. COMPARISON:  09/20/2019 FINDINGS: Brain: Mild chronic white matter ischemic change is again identified. Area of prior left basal ganglia lacunar infarct is noted. No evidence of acute hemorrhage is seen. There is a geographic area of decreased attenuation identified in the pons which appears new from the prior exam and may represent an evolving infarct. Vascular: No hyperdense vessel or unexpected calcification. Skull: Normal. Negative for fracture or focal lesion. Sinuses/Orbits: No acute finding. Metallic foreign body is again noted soft tissues adjacent to right orbit.  Other: None IMPRESSION: Chronic white matter ischemic change. Stable left basal ganglia infarct. Area of decreased attenuation in the right half of the pons which may represent an evolving infarct. Follow-up imaging may be helpful. Radiopaque foreign body adjacent to the right orbit stable from the prior exam. Electronically Signed   By: Alcide Clever M.D.   On: 09/22/2019 10:46   CT Head Wo Contrast  Result Date: 09/20/2019 CLINICAL DATA:  Dizziness since yesterday. EXAM: CT HEAD WITHOUT CONTRAST TECHNIQUE: Contiguous axial images were obtained from the base of the skull through the vertex without intravenous contrast. COMPARISON:  11/29/2010 FINDINGS: Brain: Age related cerebral atrophy, ventriculomegaly and periventricular white matter disease. Remote left basal ganglia infarct. No extra-axial fluid collections are identified. No CT findings for acute hemispheric infarction or intracranial hemorrhage. No mass lesions. The brainstem and cerebellum are normal. Vascular: Moderate vascular calcifications. No aneurysm or hyperdense vessels. Skull: No skull fracture or bone lesions. Sinuses/Orbits: The paranasal sinuses and mastoid air cells are clear. The globes are intact. Stable radiopaque foreign body (bb) in the subcutaneous tissues near the superior orbital rim on the right. Other: No scalp lesions or hematoma. IMPRESSION: 1. Age related cerebral atrophy, ventriculomegaly and periventricular white matter disease. 2. Remote left basal ganglia infarct. 3. No acute intracranial findings or mass lesions. Electronically Signed   By: Rudie Meyer M.D.   On: 09/20/2019 06:33   CT ANGIO NECK W OR WO CONTRAST  Result Date: 09/22/2019 CLINICAL DATA:  New onset left-sided weakness. EXAM: CT ANGIOGRAPHY HEAD AND NECK TECHNIQUE: Multidetector CT imaging of the head and neck was performed using the standard protocol during bolus administration of intravenous contrast. Multiplanar CT image reconstructions and MIPs were  obtained to evaluate the vascular anatomy. Carotid stenosis measurements (when applicable) are obtained utilizing NASCET criteria, using the distal internal carotid diameter as the denominator. CONTRAST:  46mL OMNIPAQUE IOHEXOL 350 MG/ML SOLN COMPARISON:  Head CT same day FINDINGS: CTA NECK FINDINGS Aortic arch: Aortic atherosclerosis. Branching pattern is normal without origin stenosis. Right carotid system: Common carotid artery is tortuous but widely patent to the bifurcation. Calcified plaque at the carotid bifurcation and ICA bulb. Proximal ICA is markedly tortuous. No stenosis however. External carotid artery shows severe stenosis. Cervical ICA widely patent beyond that. Left carotid system: Common carotid artery widely patent to the bifurcation. Atherosclerotic calcification at the carotid bifurcation but without ICA stenosis. External carotid artery shows severe stenosis. Vertebral arteries: Both vertebral artery origins are patent. Both vertebral arteries are patent through the cervical region to the foramen magnum. Skeleton: Chronic fusion throughout the cervical region. Osteophytic narrowing of the canal at C6-7. Other neck: No mass or lymphadenopathy. Upper chest: Negative Review of the MIP images confirms the above findings CTA HEAD FINDINGS Anterior circulation: Both internal carotid arteries are patent through the skull base and siphon regions. Siphon atherosclerotic calcification but without  stenosis greater than 30%. The anterior and middle cerebral vessels are patent without aneurysm or vascular malformation. There is moderate to severe stenosis of the left M1 segment. No large or medium vessel occlusion. Posterior circulation: Both vertebral arteries are patent through the foramen magnum. Posteroinferior cerebellar arteries show flow. Both vertebral arteries reach the basilar. Stenosis of the distal right vertebral artery just proximal to the basilar artery. The left vertebral artery is dominant.  No basilar stenosis. Superior cerebellar and posterior cerebral arteries are patent. Moderate stenosis of both posterior cerebral arteries at the P1 P2 junction regions. Venous sinuses: Patent and normal. Anatomic variants: None significant. Review of the MIP images confirms the above findings IMPRESSION: 1. No acute large or medium vessel occlusion. 2. Atherosclerotic disease at both carotid bifurcations but without ICA stenosis. Severe bilateral external carotid artery stenoses. 3. Moderate to severe stenosis of the left MCA M1 segment. 4. Moderate stenosis of both posterior cerebral arteries at the P1 P2 junction regions. 5. Stenosis of the distal right vertebral artery just proximal to the basilar artery. Aortic Atherosclerosis (ICD10-I70.0). Electronically Signed   By: Paulina Fusi M.D.   On: 09/22/2019 11:40   DG Hand 2 View Left  Result Date: 09/24/2019 CLINICAL DATA:  Left hand and wrist pain.  No injury. EXAM: LEFT HAND - 2 VIEW COMPARISON:  None. FINDINGS: Moderate degenerative changes over the radiocarpal joint and distal radioulnar joints. Suggestion of an old ulnar styloid fracture. Degenerative change of the carpal bones and first carpometacarpal joints. Mild degenerative change throughout the interphalangeal joints and minimal degenerative change involving the MCP joints. No definite bony erosions. Alignment and mineralization is normal. Small vessel atherosclerotic disease. IMPRESSION: 1. No acute findings. 2. Moderate degenerative changes as described.  No erosive changes. Electronically Signed   By: Elberta Fortis M.D.   On: 09/24/2019 14:24   US Carotid Bilateral (at [facility] and [facility] only)  Result Date: 09/22/2019 CLINICAL DATA:  Left-sided weakness, possible acute stroke, hypertension, hyperlipidemia, diabetes and syncope. EXAM: BILATERAL CAROTID DUPLEX ULTRASOUND TECHNIQUE: Wallace Cullens scale imaging, color Doppler and duplex ultrasound were performed of bilateral carotid and vertebral arteries in the  neck. COMPARISON:  None. FINDINGS: Criteria: Quantification of carotid stenosis is based on velocity parameters that correlate the residual internal carotid diameter with NASCET-based stenosis levels, using the diameter of the distal internal carotid lumen as the denominator for stenosis measurement. The following velocity measurements were obtained: RIGHT ICA:  104/16 cm/sec CCA:  105/13 cm/sec SYSTOLIC ICA/CCA RATIO:  1.0 ECA:  195 cm/sec LEFT ICA:  128/27 cm/sec CCA:  97/11 cm/sec SYSTOLIC ICA/CCA RATIO:  1.3 ECA:  223 cm/sec RIGHT CAROTID ARTERY: Moderate predominately calcified plaque at the level of the carotid bulb and external carotid origin. Mild plaque at the level of the proximal right ICA. Estimated right ICA stenosis is less than 50%. RIGHT VERTEBRAL ARTERY: Antegrade flow with normal waveform and velocity. LEFT CAROTID ARTERY: No focal plaque or evidence of left-sided carotid stenosis. The left ICA is moderately tortuous. LEFT VERTEBRAL ARTERY: Antegrade flow with normal waveform and velocity. IMPRESSION: Plaque at the level of the right carotid bifurcation without evidence of significant right ICA stenosis. Estimated right ICA stenosis is less than 50%. No evidence of left-sided carotid plaque or stenosis. The left ICA is tortuous. Electronically Signed   By: Irish Lack M.D.   On: 09/22/2019 11:49   US Venous Img Lower Unilateral Right  Result Date: 09/20/2019 CLINICAL DATA:  Pain and swelling in the right lower  extremity for 1 day. EXAM: Right LOWER EXTREMITY VENOUS DOPPLER ULTRASOUND TECHNIQUE: Gray-scale sonography with compression, as well as color and duplex ultrasound, were performed to evaluate the deep venous system(s) from the level of the common femoral vein through the popliteal and proximal calf veins. COMPARISON:  None. FINDINGS: VENOUS Normal compressibility of the common femoral, superficial femoral, and popliteal veins, as well as the visualized calf veins. Visualized portions  of profunda femoral vein and great saphenous vein unremarkable. No filling defects to suggest DVT on grayscale or color Doppler imaging. Doppler waveforms show normal direction of venous flow, normal respiratory plasticity and response to augmentation. Limited views of the contralateral common femoral vein are unremarkable. OTHER None. Limitations: none IMPRESSION: Negative for DVT. Electronically Signed   By: Marin Roberts M.D.   On: 09/20/2019 06:24   DG Chest Port 1 View  Result Date: 09/20/2019 CLINICAL DATA:  Some difficulty breathing. Admitted for urinary tract infection EXAM: PORTABLE CHEST 1 VIEW COMPARISON:  03/25/2018 FINDINGS: Cardiac silhouette is normal in size. No mediastinal or hilar masses. Lungs are clear.  No convincing pleural effusion.  No pneumothorax. Skeletal structures are demineralized. Chronic bilateral shoulder arthropathic changes, stable. IMPRESSION: No acute cardiopulmonary disease. Electronically Signed   By: Amie Portland M.D.   On: 09/20/2019 15:50   DG Shoulder Left  Result Date: 09/24/2019 CLINICAL DATA:  Left shoulder pain.  No injury. EXAM: LEFT SHOULDER - 2+ VIEW COMPARISON:  None. FINDINGS: There is moderate degenerative change of the glenohumeral joint and AC joints. Significant decreased acromial humeral joint space. No acute fracture or dislocation. IMPRESSION: 1. No acute findings. 2. Moderate degenerative changes of the glenohumeral joint and AC joints. Decreased acromial humeral joint space suggesting chronic rotator cuff tear. Electronically Signed   By: Elberta Fortis M.D.   On: 09/24/2019 14:21   ECHOCARDIOGRAM COMPLETE  Result Date: 09/22/2019    ECHOCARDIOGRAM REPORT   Patient Name:   [name] Date of Exam: 09/21/2019 Medical Rec #:  [id number]        Height:       60.0 in Accession #:    [id number]       Weight:       168.0 lb Date of Birth:  29-Apr-1933       BSA:          1.733 m Patient Age:    85 years         BP:           168/95 mmHg  Patient Gender: F                HR:           59 bpm. Exam Location:  [facility] Procedure: 2D Echo, Cardiac Doppler and Color Doppler Indications:     Dyspnea 786.09  History:         Patient has no prior history of Echocardiogram examinations.                  Risk Factors:Hypertension, Diabetes and Dyslipidemia.  Sonographer:     Cristela Blue RDCS (AE) Referring Phys:  [id number] Monica Martinez [facility] Diagnosing Phys: Arnoldo Hooker MD IMPRESSIONS  1. Left ventricular ejection fraction, by estimation, is 55 to 60%. The left ventricle has normal function. The left ventricle has no regional wall motion abnormalities. Left ventricular diastolic parameters were normal.  2. Right ventricular systolic function is normal. The right ventricular size is normal. There is mildly elevated pulmonary artery systolic  pressure.  3. The mitral valve is normal in structure. Trivial mitral valve regurgitation.  4. The aortic valve is normal in structure. Aortic valve regurgitation is trivial. FINDINGS  Left Ventricle: Left ventricular ejection fraction, by estimation, is 55 to 60%. The left ventricle has normal function. The left ventricle has no regional wall motion abnormalities. The left ventricular internal cavity size was normal in size. There is  no left ventricular hypertrophy. Left ventricular diastolic parameters were normal. Right Ventricle: The right ventricular size is normal. No increase in right ventricular wall thickness. Right ventricular systolic function is normal. There is mildly elevated pulmonary artery systolic pressure. The tricuspid regurgitant velocity is 2.85  m/s, and with an assumed right atrial pressure of 10 mmHg, the estimated right ventricular systolic pressure is 42.5 mmHg. Left Atrium: Left atrial size was normal in size. Right Atrium: Right atrial size was normal in size. Pericardium: There is no evidence of pericardial effusion. Mitral Valve: The mitral valve is normal in structure. Trivial mitral valve  regurgitation. Tricuspid Valve: The tricuspid valve is normal in structure. Tricuspid valve regurgitation is trivial. Aortic Valve: The aortic valve is normal in structure. Aortic valve regurgitation is trivial. Aortic valve mean gradient measures 3.7 mmHg. Aortic valve peak gradient measures 6.9 mmHg. Aortic valve area, by VTI measures 2.27 cm. Pulmonic Valve: The pulmonic valve was normal in structure. Pulmonic valve regurgitation is not visualized. Aorta: The aortic root and ascending aorta are structurally normal, with no evidence of dilitation. IAS/Shunts: No atrial level shunt detected by color flow Doppler.  LEFT VENTRICLE PLAX 2D LVIDd:         3.53 cm  Diastology LVIDs:         2.47 cm  LV e' lateral:   9.14 cm/s LV PW:         1.43 cm  LV E/e' lateral: 6.6 LV IVS:        1.90 cm  LV e' medial:    3.70 cm/s LVOT diam:     2.00 cm  LV E/e' medial:  16.2 LV SV:         69 LV SV Index:   40 LVOT Area:     3.14 cm  RIGHT VENTRICLE RV Basal diam:  2.70 cm RV S prime:     14.70 cm/s TAPSE (M-mode): 3.2 cm LEFT ATRIUM             Index       RIGHT ATRIUM           Index LA diam:        3.40 cm 1.96 cm/m  RA Area:     13.50 cm LA Vol (A2C):   57.3 ml 33.06 ml/m RA Volume:   34.70 ml  20.02 ml/m LA Vol (A4C):   41.5 ml 23.94 ml/m LA Biplane Vol: 51.5 ml 29.71 ml/m  AORTIC VALVE                   PULMONIC VALVE AV Area (Vmax):    1.88 cm    PV Vmax:        0.64 m/s AV Area (Vmean):   1.94 cm    PV Peak grad:   1.7 mmHg AV Area (VTI):     2.27 cm    RVOT Peak grad: 2 mmHg AV Vmax:           131.00 cm/s AV Vmean:          93.233 cm/s AV VTI:  0.305 m AV Peak Grad:      6.9 mmHg AV Mean Grad:      3.7 mmHg LVOT Vmax:         78.30 cm/s LVOT Vmean:        57.600 cm/s LVOT VTI:          0.220 m LVOT/AV VTI ratio: 0.72  AORTA Ao Root diam: 2.50 cm MITRAL VALVE               TRICUSPID VALVE MV Area (PHT): 1.97 cm    TR Peak grad:   32.5 mmHg MV Decel Time: 385 msec    TR Vmax:        285.00 cm/s MV E  velocity: 60.10 cm/s MV A velocity: 99.40 cm/s  SHUNTS MV E/A ratio:  0.60        Systemic VTI:  0.22 m                            Systemic Diam: 2.00 cm Arnoldo Hooker MD Electronically signed by Arnoldo Hooker MD Signature Date/Time: 09/22/2019/8:55:16 AM    Final       Subjective: Feeling well, able to move left LE better. Unable to move left arm   Discharge Exam: Vitals:   09/26/19 0042 09/26/19 0451  BP: (!) 173/68 (!) 172/69  Pulse: 64 66  Resp: 17 15  Temp: 97.8 F (36.6 C) 98 F (36.7 C)  SpO2: 100% 99%     General: Pt is alert, awake, not in acute distress Cardiovascular: RRR, S1/S2 +, no rubs, no gallops Respiratory: CTA bilaterally, no wheezing, no rhonchi Abdominal: Soft, NT, ND, bowel sounds + Extremities: no edema, no cyanosis    The results of significant diagnostics from this hospitalization (including imaging, microbiology, ancillary and laboratory) are listed below for reference.     Microbiology: Recent Results (from the past 240 hour(s))  Urine culture     Status: Abnormal   Collection Time: 09/19/19  8:29 PM   Specimen: Urine, Random  Result Value Ref Range Status   Specimen Description   Final    URINE, RANDOM Performed at [facility], [address], [place], [place] [address]    Special Requests   Final    NONE Performed at [facility], [address] [place], [place] [address]    Culture MULTIPLE SPECIES PRESENT, SUGGEST RECOLLECTION (A)  Final   Report Status 09/21/2019 FINAL  Final  SARS Coronavirus 2 by RT PCR (hospital order, performed in [facility] hospital lab) Nasopharyngeal Nasopharyngeal Swab     Status: None   Collection Time: 09/20/19  9:04 AM   Specimen: Nasopharyngeal Swab  Result Value Ref Range Status   SARS Coronavirus 2 NEGATIVE NEGATIVE Final    Comment: (NOTE) SARS-CoV-2 target nucleic acids are NOT DETECTED.  The SARS-CoV-2 RNA is generally detectable in upper and lower respiratory specimens  during the acute phase of infection. The lowest concentration of SARS-CoV-2 viral copies this assay can detect is 250 copies / mL. A negative result does not preclude SARS-CoV-2 infection and should not be used as the sole basis for treatment or other patient management decisions.  A negative result may occur with improper specimen collection / handling, submission of specimen other than nasopharyngeal swab, presence of viral mutation(s) within the areas targeted by this assay, and inadequate number of viral copies (<250 copies / mL). A negative result must be combined with clinical observations,  patient history, and epidemiological information.  Fact Sheet for Patients:   BoilerBrush.com.cyhttps://www.fda.gov/media/136312/download  Fact Sheet for Healthcare Providers: https://pope.com/https://www.fda.gov/media/136313/download  This test is not yet approved or  cleared by the [place] FDA and has been authorized for detection and/or diagnosis of SARS-CoV-2 by FDA under an Emergency Use Authorization (EUA).  This EUA will remain in effect (meaning this test can be used) for the duration of the COVID-19 declaration under Section 564(b)(1) of the Act, 21 U.S.C. section 360bbb-3(b)(1), unless the authorization is terminated or revoked sooner.  Performed at [facility], [address] [place], [place] [address]   SARS CORONAVIRUS 2 (TAT 6-24 HRS) Nasopharyngeal Nasopharyngeal Swab     Status: None   Collection Time: 09/23/19  1:26 PM   Specimen: Nasopharyngeal Swab  Result Value Ref Range Status   SARS Coronavirus 2 NEGATIVE NEGATIVE Final    Comment: (NOTE) SARS-CoV-2 target nucleic acids are NOT DETECTED.  The SARS-CoV-2 RNA is generally detectable in upper and lower respiratory specimens during the acute phase of infection. Negative results do not preclude SARS-CoV-2 infection, do not rule out co-infections with other pathogens, and should not be used as the sole basis for treatment or other  patient management decisions. Negative results must be combined with clinical observations, patient history, and epidemiological information. The expected result is Negative.  Fact Sheet for Patients: HairSlick.nohttps://www.fda.gov/media/138098/download  Fact Sheet for Healthcare Providers: quierodirigir.comhttps://www.fda.gov/media/138095/download  This test is not yet approved or cleared by the [place] FDA and  has been authorized for detection and/or diagnosis of SARS-CoV-2 by FDA under an Emergency Use Authorization (EUA). This EUA will remain  in effect (meaning this test can be used) for the duration of the COVID-19 declaration under Se ction 564(b)(1) of the Act, 21 U.S.C. section 360bbb-3(b)(1), unless the authorization is terminated or revoked sooner.  Performed at [facility], [address] [place], [place] [address]      Labs: BNP (last 3 results) Recent Labs    09/20/19 0530  BNP 34.8   Basic Metabolic Panel: Recent Labs  Lab 09/19/19 2029 09/21/19 0459 09/23/19 0455 09/24/19 0743  NA 139 142 138 137  K 4.4 4.0 3.4* 3.8  CL 103 106 100 104  CO2 28 29 27 25   GLUCOSE 234* 145* 146* 179*  BUN 15 17 13 11   CREATININE 0.75 0.69 0.62 0.59  CALCIUM 9.3 8.9 8.8* 8.4*   Liver Function Tests: No results for input(s): AST, ALT, ALKPHOS, BILITOT, PROT, ALBUMIN in the last 168 hours. No results for input(s): LIPASE, AMYLASE in the last 168 hours. No results for input(s): AMMONIA in the last 168 hours. CBC: Recent Labs  Lab 09/19/19 2029 09/21/19 0459 09/23/19 0455  WBC 10.1 8.6 9.3  HGB 12.2 11.4* 11.4*  HCT 38.1 35.9* 34.3*  MCV 97.7 97.8 94.0  PLT 242 191 196   Cardiac Enzymes: No results for input(s): CKTOTAL, CKMB, CKMBINDEX, TROPONINI in the last 168 hours. BNP: Invalid input(s): POCBNP CBG: Recent Labs  Lab 09/24/19 2058 09/25/19 0802 09/25/19 1155 09/25/19 1648 09/25/19 2117  GLUCAP 186* 170* 233* 239* 226*   D-Dimer No results for input(s):  DDIMER in the last 72 hours. Hgb A1c No results for input(s): HGBA1C in the last 72 hours. Lipid Profile No results for input(s): CHOL, HDL, LDLCALC, TRIG, CHOLHDL, LDLDIRECT in the last 72 hours. Thyroid function studies No results for input(s): TSH, T4TOTAL, T3FREE, THYROIDAB in the last 72 hours.  Invalid input(s): FREET3 Anemia work up No  results for input(s): VITAMINB12, FOLATE, FERRITIN, TIBC, IRON, RETICCTPCT in the last 72 hours. Urinalysis    Component Value Date/Time   COLORURINE YELLOW (A) 09/19/2019 2029   APPEARANCEUR CLOUDY (A) 09/19/2019 2029   LABSPEC 1.021 09/19/2019 2029   PHURINE 5.0 09/19/2019 2029   GLUCOSEU >=500 (A) 09/19/2019 2029   HGBUR SMALL (A) 09/19/2019 2029   BILIRUBINUR NEGATIVE 09/19/2019 2029   BILIRUBINUR neg 08/08/2019 0922   KETONESUR NEGATIVE 09/19/2019 2029   PROTEINUR 100 (A) 09/19/2019 2029   UROBILINOGEN 0.2 08/08/2019 0922   NITRITE POSITIVE (A) 09/19/2019 2029   LEUKOCYTESUR TRACE (A) 09/19/2019 2029   Sepsis Labs Invalid input(s): PROCALCITONIN,  WBC,  LACTICIDVEN Microbiology Recent Results (from the past 240 hour(s))  Urine culture     Status: Abnormal   Collection Time: 09/19/19  8:29 PM   Specimen: Urine, Random  Result Value Ref Range Status   Specimen Description   Final    URINE, RANDOM Performed at [facility], [address], [place], [place] [address]    Special Requests   Final    NONE Performed at [facility], [address] [place], [place] [address]    Culture MULTIPLE SPECIES PRESENT, SUGGEST RECOLLECTION (A)  Final   Report Status 09/21/2019 FINAL  Final  SARS Coronavirus 2 by RT PCR (hospital order, performed in [facility] hospital lab) Nasopharyngeal Nasopharyngeal Swab     Status: None   Collection Time: 09/20/19  9:04 AM   Specimen: Nasopharyngeal Swab  Result Value Ref Range Status   SARS Coronavirus 2 NEGATIVE NEGATIVE Final    Comment: (NOTE) SARS-CoV-2 target nucleic acids  are NOT DETECTED.  The SARS-CoV-2 RNA is generally detectable in upper and lower respiratory specimens during the acute phase of infection. The lowest concentration of SARS-CoV-2 viral copies this assay can detect is 250 copies / mL. A negative result does not preclude SARS-CoV-2 infection and should not be used as the sole basis for treatment or other patient management decisions.  A negative result may occur with improper specimen collection / handling, submission of specimen other than nasopharyngeal swab, presence of viral mutation(s) within the areas targeted by this assay, and inadequate number of viral copies (<250 copies / mL). A negative result must be combined with clinical observations, patient history, and epidemiological information.  Fact Sheet for Patients:   BoilerBrush.com.cy  Fact Sheet for Healthcare Providers: https://pope.com/  This test is not yet approved or  cleared by the [place] FDA and has been authorized for detection and/or diagnosis of SARS-CoV-2 by FDA under an Emergency Use Authorization (EUA).  This EUA will remain in effect (meaning this test can be used) for the duration of the COVID-19 declaration under Section 564(b)(1) of the Act, 21 U.S.C. section 360bbb-3(b)(1), unless the authorization is terminated or revoked sooner.  Performed at [facility], [address] [place], [place] [address]   SARS CORONAVIRUS 2 (TAT 6-24 HRS) Nasopharyngeal Nasopharyngeal Swab     Status: None   Collection Time: 09/23/19  1:26 PM   Specimen: Nasopharyngeal Swab  Result Value Ref Range Status   SARS Coronavirus 2 NEGATIVE NEGATIVE Final    Comment: (NOTE) SARS-CoV-2 target nucleic acids are NOT DETECTED.  The SARS-CoV-2 RNA is generally detectable in upper and lower respiratory specimens during the acute phase of infection. Negative results do not preclude SARS-CoV-2 infection, do not rule  out co-infections with other pathogens, and should not be used as the sole basis for treatment or  other patient management decisions. Negative results must be combined with clinical observations, patient history, and epidemiological information. The expected result is Negative.  Fact Sheet for Patients: HairSlick.no  Fact Sheet for Healthcare Providers: quierodirigir.com  This test is not yet approved or cleared by the [place] FDA and  has been authorized for detection and/or diagnosis of SARS-CoV-2 by FDA under an Emergency Use Authorization (EUA). This EUA will remain  in effect (meaning this test can be used) for the duration of the COVID-19 declaration under Se ction 564(b)(1) of the Act, 21 U.S.C. section 360bbb-3(b)(1), unless the authorization is terminated or revoked sooner.  Performed at [facility], [address], [place], [place] [address]      Time coordinating discharge: 40 minutes  SIGNED:   Alba Cory, MD  [facility]

## 2019-09-26 NOTE — TOC Transition Note (Signed)
Transition of Care [facility]) - CM/SW Discharge Note   Patient Details  Name: CYRAH MCLAMB MRN: [id number] Date of Birth: 12-22-1933  Transition of Care [facility]) CM/SW Contact:  Allayne Butcher, RN Phone Number: 09/26/2019, 2:41 PM   Clinical Narrative:    Patient will discharge to [facility] for rehab today.  Insurance authorization received by the facility.  Patient will be going to room 412, bedside RN to call report to [phone].  Patient's daughter updated on discharge    Final next level of care: Skilled Nursing Facility Barriers to Discharge: Barriers Resolved   Patient Goals and CMS Choice Patient states their goals for this hospitalization and ongoing recovery are:: to be able to walk good CMS Medicare.gov Compare Post Acute Care list provided to:: Patient Choice offered to / list presented to : Patient  Discharge Placement   Existing PASRR number confirmed : 09/23/19          Patient [facility] bed at: [facility] Patient to be transferred to facility by:  EMS Name of family member notified: Camilla Patient and family notified of of transfer: 09/26/19  Discharge Plan and Services   Discharge Planning Services: CM Consult Post Acute Care Choice: Home Health                    HH Arranged: PT, OT HH [facility]: [facility] Date HH Agency Contacted: 09/21/19 Time HH Agency Contacted: 1050 Representative spoke with at [facility] Agency: Mellissa Kohut  Social Determinants of Health (SDOH) Interventions     Readmission Risk Interventions No flowsheet data found.

## 2019-09-26 NOTE — Care Management Important Message (Signed)
Important Message  Patient Details  Name: [name] MRN: [id number] Date of Birth: 08/15/1933   Medicare Important Message Given:  Yes     Bernadette Hoit 09/26/2019, 11:16 AM

## 2019-09-27 DIAGNOSIS — I639 Cerebral infarction, unspecified: Secondary | ICD-10-CM | POA: Diagnosis not present

## 2019-09-27 DIAGNOSIS — E039 Hypothyroidism, unspecified: Secondary | ICD-10-CM | POA: Diagnosis not present

## 2019-09-27 DIAGNOSIS — M1990 Unspecified osteoarthritis, unspecified site: Secondary | ICD-10-CM | POA: Diagnosis not present

## 2019-09-27 DIAGNOSIS — D649 Anemia, unspecified: Secondary | ICD-10-CM | POA: Diagnosis not present

## 2019-09-27 DIAGNOSIS — M2550 Pain in unspecified joint: Secondary | ICD-10-CM | POA: Diagnosis not present

## 2019-09-27 DIAGNOSIS — E1142 Type 2 diabetes mellitus with diabetic polyneuropathy: Secondary | ICD-10-CM | POA: Diagnosis not present

## 2019-09-27 DIAGNOSIS — R809 Proteinuria, unspecified: Secondary | ICD-10-CM | POA: Diagnosis not present

## 2019-09-27 DIAGNOSIS — I2510 Atherosclerotic heart disease of native coronary artery without angina pectoris: Secondary | ICD-10-CM | POA: Diagnosis not present

## 2019-09-27 DIAGNOSIS — R5381 Other malaise: Secondary | ICD-10-CM | POA: Diagnosis not present

## 2019-09-27 DIAGNOSIS — R531 Weakness: Secondary | ICD-10-CM | POA: Diagnosis not present

## 2019-09-27 DIAGNOSIS — N390 Urinary tract infection, site not specified: Secondary | ICD-10-CM | POA: Diagnosis not present

## 2019-09-27 DIAGNOSIS — Z7984 Long term (current) use of oral hypoglycemic drugs: Secondary | ICD-10-CM | POA: Diagnosis not present

## 2019-09-27 DIAGNOSIS — I69351 Hemiplegia and hemiparesis following cerebral infarction affecting right dominant side: Secondary | ICD-10-CM | POA: Insufficient documentation

## 2019-09-27 DIAGNOSIS — E063 Autoimmune thyroiditis: Secondary | ICD-10-CM | POA: Diagnosis not present

## 2019-09-27 DIAGNOSIS — E782 Mixed hyperlipidemia: Secondary | ICD-10-CM | POA: Diagnosis not present

## 2019-09-27 DIAGNOSIS — I6523 Occlusion and stenosis of bilateral carotid arteries: Secondary | ICD-10-CM | POA: Diagnosis not present

## 2019-09-27 DIAGNOSIS — E1129 Type 2 diabetes mellitus with other diabetic kidney complication: Secondary | ICD-10-CM | POA: Diagnosis not present

## 2019-09-27 DIAGNOSIS — I779 Disorder of arteries and arterioles, unspecified: Secondary | ICD-10-CM | POA: Diagnosis not present

## 2019-09-27 DIAGNOSIS — I10 Essential (primary) hypertension: Secondary | ICD-10-CM | POA: Diagnosis not present

## 2019-09-27 DIAGNOSIS — M12812 Other specific arthropathies, not elsewhere classified, left shoulder: Secondary | ICD-10-CM | POA: Diagnosis not present

## 2019-09-27 DIAGNOSIS — G8194 Hemiplegia, unspecified affecting left nondominant side: Secondary | ICD-10-CM | POA: Diagnosis not present

## 2019-09-27 DIAGNOSIS — R4182 Altered mental status, unspecified: Secondary | ICD-10-CM | POA: Diagnosis not present

## 2019-09-27 DIAGNOSIS — E785 Hyperlipidemia, unspecified: Secondary | ICD-10-CM | POA: Diagnosis not present

## 2019-09-27 DIAGNOSIS — N3001 Acute cystitis with hematuria: Secondary | ICD-10-CM | POA: Diagnosis not present

## 2019-09-27 DIAGNOSIS — Z7401 Bed confinement status: Secondary | ICD-10-CM | POA: Diagnosis not present

## 2019-09-27 DIAGNOSIS — E119 Type 2 diabetes mellitus without complications: Secondary | ICD-10-CM | POA: Diagnosis not present

## 2019-09-27 DIAGNOSIS — Z8673 Personal history of transient ischemic attack (TIA), and cerebral infarction without residual deficits: Secondary | ICD-10-CM | POA: Diagnosis not present

## 2019-09-27 DIAGNOSIS — Z7982 Long term (current) use of aspirin: Secondary | ICD-10-CM | POA: Diagnosis not present

## 2019-09-27 LAB — RHEUMATOID FACTOR: Rheumatoid fact SerPl-aCnc: 15.3 IU/mL — ABNORMAL HIGH (ref 0.0–13.9)

## 2019-09-27 LAB — GLUCOSE, CAPILLARY
Glucose-Capillary: 162 mg/dL — ABNORMAL HIGH (ref 70–99)
Glucose-Capillary: 294 mg/dL — ABNORMAL HIGH (ref 70–99)

## 2019-09-27 MED ORDER — METFORMIN HCL 500 MG PO TABS
1000.0000 mg | ORAL_TABLET | Freq: Two times a day (BID) | ORAL | Status: DC
  Filled 2019-09-27: qty 2

## 2019-09-27 NOTE — TOC Progression Note (Signed)
Transition of Care [facility]) - Progression Note    Patient Details  Name: SRIJA SOUTHARD MRN: [id number] Date of Birth: January 29, 1934  Transition of Care [facility]) CM/SW Contact  Margarito Liner, LCSW Phone Number: 09/27/2019, 9:52 AM  Clinical Narrative: Sent updated discharge summary to [facility]. Left voicemail for admissions coordinator to notify and find out time to set up transport.    Expected Discharge Plan: Home w Home Health Services Barriers to Discharge: Barriers Resolved  Expected Discharge Plan and Services Expected Discharge Plan: Home w Home Health Services   Discharge Planning Services: CM Consult Post Acute Care Choice: Home Health Living arrangements for the past 2 months: Single Family Home Expected Discharge Date: 09/27/19                         HH Arranged: PT, OT HH [facility] Date HH Agency Contacted: 09/21/19 Time HH Agency Contacted: 1050 Representative spoke with at [facility] Agency: Mellissa Kohut   Social Determinants of Health (SDOH) Interventions    Readmission Risk Interventions No flowsheet data found.

## 2019-09-27 NOTE — Progress Notes (Signed)
Gave report to Morrie Sheldon, Charity fundraiser at [facility] [phone] at this time. Requested CM to set up transport.

## 2019-09-27 NOTE — TOC Transition Note (Signed)
Transition of Care [facility]) - CM/SW Discharge Note   Patient Details  Name: [name] MRN: [id number] Date of Birth: 1933-06-26  Transition of Care [facility]) CM/SW Contact:  Allayne Butcher, RN Phone Number: 09/27/2019, 11:27 AM   Clinical Narrative:    EMS transport to [facility] has been arranged.  Patient is first on the list to be picked up.    Final next level of care: Skilled Nursing Facility Barriers to Discharge: Barriers Resolved   Patient Goals and CMS Choice Patient states their goals for this hospitalization and ongoing recovery are:: to be able to walk good CMS Medicare.gov Compare Post Acute Care list provided to:: Patient Choice offered to / list presented to : Patient  Discharge Placement   Existing PASRR number confirmed : 09/23/19          Patient chooses bed at: [facility] Patient to be transferred to facility by: [facility] Name of family member notified: Camilla Patient and family notified of of transfer: 09/26/19  Discharge Plan and Services   Discharge Planning Services: CM Consult Post Acute Care Choice: Home Health                    HH Arranged: PT, OT HH Agency: [facility] Date HH Agency Contacted: 09/21/19 Time HH Agency Contacted: 1050 Representative spoke with at [facility] Agency: Mellissa Kohut  Social Determinants of Health (SDOH) Interventions     Readmission Risk Interventions No flowsheet data found.

## 2019-09-27 NOTE — Discharge Summary (Signed)
Physician Discharge Summary  SHARMIN FOULK BJY:[id number] DOB: 02-Sep-1933 DOA: 09/20/2019  PCP: Gracelyn Nurse, MD  Admit date: 09/20/2019 Discharge date: 09/27/2019  Admitted From: Home Disposition:  SNF  Recommendations for Outpatient Follow-up:  1. Follow up with PCP in 1-2 weeks 2. Please obtain BMP/CBC in one week 3. Needs follow up with orthopedic for rotator cuff tear.  4. Needs to follow up with neurology after stroke 5. Needs aspirin 325 mg and plavix for 3 month,then plavix alone./    Discharge Condition: Stable CODE STATUS: Full code Diet recommendation: Carb Modified   Brief/Interim Summary: 86 year old past medical history significant for hypertension, hyperlipidemia, diverticulitis, diabetes who presents with acute metabolic encephalopathy, unsteady gait, frequent urination and right thigh discomfort for the last few days.  Patient presented with hypertensive crisis, UA positive for UTI, Kovic 19 test negative.  CT head negative for acute intracranial abnormality.  Chest x-ray; no acute cardiopulmonary disease  Stable for discharge today   1-Left side weakness; Acute pontine stroke. Small vessel diseases.  Patient complained of left arm and leg pain. She was not able to move left side, due to pain and weakness. Patient reported she wake up with this symptoms, left side pain and weakness. I discussed with patient's Daughter; patient was able to move left side prior to admission. I consulted Neurology. Plan was for activation of Code stroke.  -Not TPA candidate, unknown time of symptoms.  -CT head ordered stat: Chronic white matter ischemic change. Stable left basal ganglia Infarct. Area of decreased attenuation in the right half of the pons which may represent an evolving infarct. Follow-up imaging may be helpful. -Aspirin increase to 325 mg. Continue with statins.  -Stroke order set implemented.  -CTA; No acute large or medium vessel occlusion. Atherosclerotic  disease at both carotid bifurcations but without ICA stenosis. Severe bilateral external carotid artery stenoses.Moderate to severe stenosis of the left MCA M1 segment. Moderate stenosis of both posterior cerebral arteries at the P1 P2 junction regions. Stenosis of the distal right vertebral artery just proximal to the basilar artery. -permissive HTN.  She will need rehab.  Plan for aspirin 325 mg plus plavix for three months then plavix alone.  Repeated ct head stable.   Acute metabolic encephalopathy likely secondary to UTI Patient is alert and oriented. UA with 21-50 white blood cells. Treated with  IV ceftriaxone. Plan for 3 days of IV antibiotics.  CT head negative for acute abnormality. Unable to perform MRI due to foreign body eye  Urine culture multiple species present  Hypertensive crisis, uncontrolled hypertension: Chest x-ray negative. Doppler lower extremity negative for DVT Permissive HTN due to stroke. Started low dose lisinopril.  Echo; Normal EF Continue with lisinopril, resume HCTZ.  Plan to start controlling BP  Dizziness/unsteady gait: CT negative. Patient unable to get MRI B12 low normal; 227. Started supplement.  PT eval  Diabetes type 2 Continue with a sliding scale insulin Resume home meds CBG elevated last night at 400, Bmet negative for DKA. CBG better controlled this am. Resume metformin and glipizide.   Hyperlipidemia continue with statins  Hypokalemia; replete orally.   Left arm pain; x ray: chronic rotator cough tear. Will need to follow up with ortho out patient.  voltaren gel ordered.  Follow up with Dr Joice Lofts orthopedic n 3 weeks.    Discharge Diagnoses:  Principal Problem:   UTI (urinary tract infection) Active Problems:   Type 2 diabetes mellitus with polyneuropathy (HCC)   Mixed hyperlipidemia  Essential hypertension    Discharge Instructions  Discharge Instructions    Diet Carb Modified   Complete by: As directed     Increase activity slowly   Complete by: As directed    Increase activity slowly   Complete by: As directed      Allergies as of 09/27/2019   No Known Allergies     Medication List    STOP taking these medications   naproxen sodium 220 MG tablet Commonly known as: ALEVE   simvastatin 40 MG tablet Commonly known as: ZOCOR     TAKE these medications   acetaminophen 325 MG tablet Commonly known as: TYLENOL Take 2 tablets (650 mg total) by mouth every 6 (six) hours as needed for mild pain, moderate pain, fever or headache.   aspirin 325 MG EC tablet Take 1 tablet (325 mg total) by mouth daily. What changed:   medication strength  how much to take   atorvastatin 80 MG tablet Commonly known as: LIPITOR Take 1 tablet (80 mg total) by mouth daily.   clopidogrel 75 MG tablet Commonly known as: PLAVIX Take 1 tablet (75 mg total) by mouth daily.   cyanocobalamin 100 MCG tablet Take 1 tablet (100 mcg total) by mouth daily.   glimepiride 4 MG tablet Commonly known as: AMARYL Take 4 mg by mouth daily with breakfast.   hydrochlorothiazide 12.5 MG tablet Commonly known as: HYDRODIURIL Take 12.5 mg by mouth daily.   lisinopril 20 MG tablet Commonly known as: ZESTRIL Take by mouth.   metFORMIN 1000 MG tablet Commonly known as: GLUCOPHAGE Take 1,000 mg by mouth 2 (two) times daily with a meal.            Durable Medical Equipment  (From admission, onward)         Start     Ordered   09/23/19 1211  For home use only DME 3 n 1  Once        09/23/19 1210   09/22/19 0721  For home use only DME 3 n 1  Once        09/22/19 0720          Contact information for follow-up providers    Gracelyn Nurse, MD. Schedule an appointment as soon as possible for a visit on 10/03/2019.   Specialty: Internal Medicine Contact information: [address] [place] [address] [phone]        Christena Flake, MD. Go on 10/14/2019.   Specialty: Orthopedic  Surgery Why: at 2:00pm with PA Contact information: [address] [facility] [place] [address] [phone]            Contact information for after-discharge care    Destination    [facility].   Service: Skilled Nursing Contact information: [address] [place] [address] [phone]                 No Known Allergies  Consultations:  Neurology     Procedures/Studies: CT ANGIO HEAD W OR WO CONTRAST  Result Date: 09/22/2019 CLINICAL DATA:  New onset left-sided weakness. EXAM: CT ANGIOGRAPHY HEAD AND NECK TECHNIQUE: Multidetector CT imaging of the head and neck was performed using the standard protocol during bolus administration of intravenous contrast. Multiplanar CT image reconstructions and MIPs were obtained to evaluate the vascular anatomy. Carotid stenosis measurements (when applicable) are obtained utilizing NASCET criteria, using the distal internal carotid diameter as the denominator. CONTRAST:  75mL  OMNIPAQUE IOHEXOL 350 MG/ML SOLN COMPARISON:  Head CT same day FINDINGS: CTA NECK FINDINGS Aortic arch: Aortic atherosclerosis. Branching pattern is normal without origin stenosis. Right carotid system: Common carotid artery is tortuous but widely patent to the bifurcation. Calcified plaque at the carotid bifurcation and ICA bulb. Proximal ICA is markedly tortuous. No stenosis however. External carotid artery shows severe stenosis. Cervical ICA widely patent beyond that. Left carotid system: Common carotid artery widely patent to the bifurcation. Atherosclerotic calcification at the carotid bifurcation but without ICA stenosis. External carotid artery shows severe stenosis. Vertebral arteries: Both vertebral artery origins are patent. Both vertebral arteries are patent through the cervical region to the foramen magnum. Skeleton: Chronic fusion throughout the cervical region. Osteophytic narrowing of  the canal at C6-7. Other neck: No mass or lymphadenopathy. Upper chest: Negative Review of the MIP images confirms the above findings CTA HEAD FINDINGS Anterior circulation: Both internal carotid arteries are patent through the skull base and siphon regions. Siphon atherosclerotic calcification but without stenosis greater than 30%. The anterior and middle cerebral vessels are patent without aneurysm or vascular malformation. There is moderate to severe stenosis of the left M1 segment. No large or medium vessel occlusion. Posterior circulation: Both vertebral arteries are patent through the foramen magnum. Posteroinferior cerebellar arteries show flow. Both vertebral arteries reach the basilar. Stenosis of the distal right vertebral artery just proximal to the basilar artery. The left vertebral artery is dominant. No basilar stenosis. Superior cerebellar and posterior cerebral arteries are patent. Moderate stenosis of both posterior cerebral arteries at the P1 P2 junction regions. Venous sinuses: Patent and normal. Anatomic variants: None significant. Review of the MIP images confirms the above findings IMPRESSION: 1. No acute large or medium vessel occlusion. 2. Atherosclerotic disease at both carotid bifurcations but without ICA stenosis. Severe bilateral external carotid artery stenoses. 3. Moderate to severe stenosis of the left MCA M1 segment. 4. Moderate stenosis of both posterior cerebral arteries at the P1 P2 junction regions. 5. Stenosis of the distal right vertebral artery just proximal to the basilar artery. Aortic Atherosclerosis (ICD10-I70.0). Electronically Signed   By: Paulina Fusi M.D.   On: 09/22/2019 11:40   CT HEAD WO CONTRAST  Result Date: 09/23/2019 CLINICAL DATA:  Left-sided weakness EXAM: CT HEAD WITHOUT CONTRAST TECHNIQUE: Contiguous axial images were obtained from the base of the skull through the vertex without intravenous contrast. COMPARISON:  September 22, 2019 FINDINGS: Brain: Mild  diffuse atrophy is stable. There is no evident intracranial mass, hemorrhage, extra-axial fluid collection, or midline shift. Stable appearing infarct involving portions of the right pons and midbrain. Stable appearing prior infarct involving portions of the anterior limbs of the internal and external capsule on the left. There is stable patchy small vessel disease in the centra semiovale bilaterally. No new infarct evident compared to 1 day prior. Vascular: No hyperdense vessel. There is calcification in each carotid siphon region. Skull: The bony calvarium appears intact. Sinuses/Orbits: There is mucosal thickening in several ethmoid air cells. Other visualized paranasal sinuses are clear. No intraorbital lesions. A metallic foreign body is again noted along the medial right preseptal orbital region. Other: Mastoid air cells are clear. IMPRESSION: Prior anterior left basal ganglia and right pons/midbrain infarcts, stable. Stable patchy periventricular small vessel disease. No mass or hemorrhage. No acute acute infarct compared to 1 day prior evident. There are foci of arterial vascular calcification. Metallic foreign body in the medial preseptal right orbital region is stable. Mucosal thickening noted in several ethmoid  air cells. Electronically Signed   By: Bretta Bang III M.D.   On: 09/23/2019 10:17   CT HEAD WO CONTRAST  Result Date: 09/22/2019 CLINICAL DATA:  New onset left-sided weakness EXAM: CT HEAD WITHOUT CONTRAST TECHNIQUE: Contiguous axial images were obtained from the base of the skull through the vertex without intravenous contrast. COMPARISON:  09/20/2019 FINDINGS: Brain: Mild chronic white matter ischemic change is again identified. Area of prior left basal ganglia lacunar infarct is noted. No evidence of acute hemorrhage is seen. There is a geographic area of decreased attenuation identified in the pons which appears new from the prior exam and may represent an evolving infarct. Vascular:  No hyperdense vessel or unexpected calcification. Skull: Normal. Negative for fracture or focal lesion. Sinuses/Orbits: No acute finding. Metallic foreign body is again noted soft tissues adjacent to right orbit. Other: None IMPRESSION: Chronic white matter ischemic change. Stable left basal ganglia infarct. Area of decreased attenuation in the right half of the pons which may represent an evolving infarct. Follow-up imaging may be helpful. Radiopaque foreign body adjacent to the right orbit stable from the prior exam. Electronically Signed   By: Alcide Clever M.D.   On: 09/22/2019 10:46   CT Head Wo Contrast  Result Date: 09/20/2019 CLINICAL DATA:  Dizziness since yesterday. EXAM: CT HEAD WITHOUT CONTRAST TECHNIQUE: Contiguous axial images were obtained from the base of the skull through the vertex without intravenous contrast. COMPARISON:  11/29/2010 FINDINGS: Brain: Age related cerebral atrophy, ventriculomegaly and periventricular white matter disease. Remote left basal ganglia infarct. No extra-axial fluid collections are identified. No CT findings for acute hemispheric infarction or intracranial hemorrhage. No mass lesions. The brainstem and cerebellum are normal. Vascular: Moderate vascular calcifications. No aneurysm or hyperdense vessels. Skull: No skull fracture or bone lesions. Sinuses/Orbits: The paranasal sinuses and mastoid air cells are clear. The globes are intact. Stable radiopaque foreign body (bb) in the subcutaneous tissues near the superior orbital rim on the right. Other: No scalp lesions or hematoma. IMPRESSION: 1. Age related cerebral atrophy, ventriculomegaly and periventricular white matter disease. 2. Remote left basal ganglia infarct. 3. No acute intracranial findings or mass lesions. Electronically Signed   By: Rudie Meyer M.D.   On: 09/20/2019 06:33   CT ANGIO NECK W OR WO CONTRAST  Result Date: 09/22/2019 CLINICAL DATA:  New onset left-sided weakness. EXAM: CT ANGIOGRAPHY HEAD  AND NECK TECHNIQUE: Multidetector CT imaging of the head and neck was performed using the standard protocol during bolus administration of intravenous contrast. Multiplanar CT image reconstructions and MIPs were obtained to evaluate the vascular anatomy. Carotid stenosis measurements (when applicable) are obtained utilizing NASCET criteria, using the distal internal carotid diameter as the denominator. CONTRAST:  30mL OMNIPAQUE IOHEXOL 350 MG/ML SOLN COMPARISON:  Head CT same day FINDINGS: CTA NECK FINDINGS Aortic arch: Aortic atherosclerosis. Branching pattern is normal without origin stenosis. Right carotid system: Common carotid artery is tortuous but widely patent to the bifurcation. Calcified plaque at the carotid bifurcation and ICA bulb. Proximal ICA is markedly tortuous. No stenosis however. External carotid artery shows severe stenosis. Cervical ICA widely patent beyond that. Left carotid system: Common carotid artery widely patent to the bifurcation. Atherosclerotic calcification at the carotid bifurcation but without ICA stenosis. External carotid artery shows severe stenosis. Vertebral arteries: Both vertebral artery origins are patent. Both vertebral arteries are patent through the cervical region to the foramen magnum. Skeleton: Chronic fusion throughout the cervical region. Osteophytic narrowing of the canal at C6-7. Other neck:  No mass or lymphadenopathy. Upper chest: Negative Review of the MIP images confirms the above findings CTA HEAD FINDINGS Anterior circulation: Both internal carotid arteries are patent through the skull base and siphon regions. Siphon atherosclerotic calcification but without stenosis greater than 30%. The anterior and middle cerebral vessels are patent without aneurysm or vascular malformation. There is moderate to severe stenosis of the left M1 segment. No large or medium vessel occlusion. Posterior circulation: Both vertebral arteries are patent through the foramen magnum.  Posteroinferior cerebellar arteries show flow. Both vertebral arteries reach the basilar. Stenosis of the distal right vertebral artery just proximal to the basilar artery. The left vertebral artery is dominant. No basilar stenosis. Superior cerebellar and posterior cerebral arteries are patent. Moderate stenosis of both posterior cerebral arteries at the P1 P2 junction regions. Venous sinuses: Patent and normal. Anatomic variants: None significant. Review of the MIP images confirms the above findings IMPRESSION: 1. No acute large or medium vessel occlusion. 2. Atherosclerotic disease at both carotid bifurcations but without ICA stenosis. Severe bilateral external carotid artery stenoses. 3. Moderate to severe stenosis of the left MCA M1 segment. 4. Moderate stenosis of both posterior cerebral arteries at the P1 P2 junction regions. 5. Stenosis of the distal right vertebral artery just proximal to the basilar artery. Aortic Atherosclerosis (ICD10-I70.0). Electronically Signed   By: Paulina Fusi M.D.   On: 09/22/2019 11:40   DG Hand 2 View Left  Result Date: 09/24/2019 CLINICAL DATA:  Left hand and wrist pain.  No injury. EXAM: LEFT HAND - 2 VIEW COMPARISON:  None. FINDINGS: Moderate degenerative changes over the radiocarpal joint and distal radioulnar joints. Suggestion of an old ulnar styloid fracture. Degenerative change of the carpal bones and first carpometacarpal joints. Mild degenerative change throughout the interphalangeal joints and minimal degenerative change involving the MCP joints. No definite bony erosions. Alignment and mineralization is normal. Small vessel atherosclerotic disease. IMPRESSION: 1. No acute findings. 2. Moderate degenerative changes as described.  No erosive changes. Electronically Signed   By: Elberta Fortis M.D.   On: 09/24/2019 14:24   US Carotid Bilateral (at [facility] and [facility] only)  Result Date: 09/22/2019 CLINICAL DATA:  Left-sided weakness, possible acute stroke, hypertension,  hyperlipidemia, diabetes and syncope. EXAM: BILATERAL CAROTID DUPLEX ULTRASOUND TECHNIQUE: Wallace Cullens scale imaging, color Doppler and duplex ultrasound were performed of bilateral carotid and vertebral arteries in the neck. COMPARISON:  None. FINDINGS: Criteria: Quantification of carotid stenosis is based on velocity parameters that correlate the residual internal carotid diameter with NASCET-based stenosis levels, using the diameter of the distal internal carotid lumen as the denominator for stenosis measurement. The following velocity measurements were obtained: RIGHT ICA:  104/16 cm/sec CCA:  105/13 cm/sec SYSTOLIC ICA/CCA RATIO:  1.0 ECA:  195 cm/sec LEFT ICA:  128/27 cm/sec CCA:  97/11 cm/sec SYSTOLIC ICA/CCA RATIO:  1.3 ECA:  223 cm/sec RIGHT CAROTID ARTERY: Moderate predominately calcified plaque at the level of the carotid bulb and external carotid origin. Mild plaque at the level of the proximal right ICA. Estimated right ICA stenosis is less than 50%. RIGHT VERTEBRAL ARTERY: Antegrade flow with normal waveform and velocity. LEFT CAROTID ARTERY: No focal plaque or evidence of left-sided carotid stenosis. The left ICA is moderately tortuous. LEFT VERTEBRAL ARTERY: Antegrade flow with normal waveform and velocity. IMPRESSION: Plaque at the level of the right carotid bifurcation without evidence of significant right ICA stenosis. Estimated right ICA stenosis is less than 50%. No evidence of left-sided carotid plaque or stenosis. The left  ICA is tortuous. Electronically Signed   By: Irish LackGlenn  Yamagata M.D.   On: 09/22/2019 11:49   US Venous Img Lower Unilateral Right  Result Date: 09/20/2019 CLINICAL DATA:  Pain and swelling in the right lower extremity for 1 day. EXAM: Right LOWER EXTREMITY VENOUS DOPPLER ULTRASOUND TECHNIQUE: Gray-scale sonography with compression, as well as color and duplex ultrasound, were performed to evaluate the deep venous system(s) from the level of the common femoral vein through the  popliteal and proximal calf veins. COMPARISON:  None. FINDINGS: VENOUS Normal compressibility of the common femoral, superficial femoral, and popliteal veins, as well as the visualized calf veins. Visualized portions of profunda femoral vein and great saphenous vein unremarkable. No filling defects to suggest DVT on grayscale or color Doppler imaging. Doppler waveforms show normal direction of venous flow, normal respiratory plasticity and response to augmentation. Limited views of the contralateral common femoral vein are unremarkable. OTHER None. Limitations: none IMPRESSION: Negative for DVT. Electronically Signed   By: Marin Robertshristopher  Mattern M.D.   On: 09/20/2019 06:24   DG Chest Port 1 View  Result Date: 09/20/2019 CLINICAL DATA:  Some difficulty breathing. Admitted for urinary tract infection EXAM: PORTABLE CHEST 1 VIEW COMPARISON:  03/25/2018 FINDINGS: Cardiac silhouette is normal in size. No mediastinal or hilar masses. Lungs are clear.  No convincing pleural effusion.  No pneumothorax. Skeletal structures are demineralized. Chronic bilateral shoulder arthropathic changes, stable. IMPRESSION: No acute cardiopulmonary disease. Electronically Signed   By: Amie Portlandavid  Ormond M.D.   On: 09/20/2019 15:50   DG Shoulder Left  Result Date: 09/24/2019 CLINICAL DATA:  Left shoulder pain.  No injury. EXAM: LEFT SHOULDER - 2+ VIEW COMPARISON:  None. FINDINGS: There is moderate degenerative change of the glenohumeral joint and AC joints. Significant decreased acromial humeral joint space. No acute fracture or dislocation. IMPRESSION: 1. No acute findings. 2. Moderate degenerative changes of the glenohumeral joint and AC joints. Decreased acromial humeral joint space suggesting chronic rotator cuff tear. Electronically Signed   By: Elberta Fortisaniel  Boyle M.D.   On: 09/24/2019 14:21   ECHOCARDIOGRAM COMPLETE  Result Date: 09/22/2019    ECHOCARDIOGRAM REPORT   Patient Name:   Lorinda CreedHALLIE G Jackson Date of Exam: 09/21/2019 Medical Rec  #:  [id number]        Height:       60.0 in Accession #:    [id number]       Weight:       168.0 lb Date of Birth:  1933/07/28       BSA:          1.733 m Patient Age:    85 years         BP:           168/95 mmHg Patient Gender: F                HR:           59 bpm. Exam Location:  [facility] Procedure: 2D Echo, Cardiac Doppler and Color Doppler Indications:     Dyspnea 786.09  History:         Patient has no prior history of Echocardiogram examinations.                  Risk Factors:Hypertension, Diabetes and Dyslipidemia.  Sonographer:     Cristela BlueJerry Hege RDCS (AE) Referring Phys:  [id number] Monica MartinezKEIRUKA J Un[facility] Len[facility] CareEZENDUKA Diagnosing Phys: Arnoldo HookerBruce Kowalski MD IMPRESSIONS  1. Left ventricular ejection fraction, by estimation, is 55 to 60%. The left ventricle  has normal function. The left ventricle has no regional wall motion abnormalities. Left ventricular diastolic parameters were normal.  2. Right ventricular systolic function is normal. The right ventricular size is normal. There is mildly elevated pulmonary artery systolic pressure.  3. The mitral valve is normal in structure. Trivial mitral valve regurgitation.  4. The aortic valve is normal in structure. Aortic valve regurgitation is trivial. FINDINGS  Left Ventricle: Left ventricular ejection fraction, by estimation, is 55 to 60%. The left ventricle has normal function. The left ventricle has no regional wall motion abnormalities. The left ventricular internal cavity size was normal in size. There is  no left ventricular hypertrophy. Left ventricular diastolic parameters were normal. Right Ventricle: The right ventricular size is normal. No increase in right ventricular wall thickness. Right ventricular systolic function is normal. There is mildly elevated pulmonary artery systolic pressure. The tricuspid regurgitant velocity is 2.85  m/s, and with an assumed right atrial pressure of 10 mmHg, the estimated right ventricular systolic pressure is 42.5 mmHg. Left Atrium: Left atrial  size was normal in size. Right Atrium: Right atrial size was normal in size. Pericardium: There is no evidence of pericardial effusion. Mitral Valve: The mitral valve is normal in structure. Trivial mitral valve regurgitation. Tricuspid Valve: The tricuspid valve is normal in structure. Tricuspid valve regurgitation is trivial. Aortic Valve: The aortic valve is normal in structure. Aortic valve regurgitation is trivial. Aortic valve mean gradient measures 3.7 mmHg. Aortic valve peak gradient measures 6.9 mmHg. Aortic valve area, by VTI measures 2.27 cm. Pulmonic Valve: The pulmonic valve was normal in structure. Pulmonic valve regurgitation is not visualized. Aorta: The aortic root and ascending aorta are structurally normal, with no evidence of dilitation. IAS/Shunts: No atrial level shunt detected by color flow Doppler.  LEFT VENTRICLE PLAX 2D LVIDd:         3.53 cm  Diastology LVIDs:         2.47 cm  LV e' lateral:   9.14 cm/s LV PW:         1.43 cm  LV E/e' lateral: 6.6 LV IVS:        1.90 cm  LV e' medial:    3.70 cm/s LVOT diam:     2.00 cm  LV E/e' medial:  16.2 LV SV:         69 LV SV Index:   40 LVOT Area:     3.14 cm  RIGHT VENTRICLE RV Basal diam:  2.70 cm RV S prime:     14.70 cm/s TAPSE (M-mode): 3.2 cm LEFT ATRIUM             Index       RIGHT ATRIUM           Index LA diam:        3.40 cm 1.96 cm/m  RA Area:     13.50 cm LA Vol (A2C):   57.3 ml 33.06 ml/m RA Volume:   34.70 ml  20.02 ml/m LA Vol (A4C):   41.5 ml 23.94 ml/m LA Biplane Vol: 51.5 ml 29.71 ml/m  AORTIC VALVE                   PULMONIC VALVE AV Area (Vmax):    1.88 cm    PV Vmax:        0.64 m/s AV Area (Vmean):   1.94 cm    PV Peak grad:   1.7 mmHg AV Area (VTI):     2.27  cm    RVOT Peak grad: 2 mmHg AV Vmax:           131.00 cm/s AV Vmean:          93.233 cm/s AV VTI:            0.305 m AV Peak Grad:      6.9 mmHg AV Mean Grad:      3.7 mmHg LVOT Vmax:         78.30 cm/s LVOT Vmean:        57.600 cm/s LVOT VTI:          0.220  m LVOT/AV VTI ratio: 0.72  AORTA Ao Root diam: 2.50 cm MITRAL VALVE               TRICUSPID VALVE MV Area (PHT): 1.97 cm    TR Peak grad:   32.5 mmHg MV Decel Time: 385 msec    TR Vmax:        285.00 cm/s MV E velocity: 60.10 cm/s MV A velocity: 99.40 cm/s  SHUNTS MV E/A ratio:  0.60        Systemic VTI:  0.22 m                            Systemic Diam: 2.00 cm Arnoldo Hooker MD Electronically signed by Arnoldo Hooker MD Signature Date/Time: 09/22/2019/8:55:16 AM    Final      Subjective: Feeling well, able to move left LE better. Unable to move left arm   Discharge Exam: Vitals:   09/27/19 0329 09/27/19 0831  BP: (!) 150/64 (!) 120/105  Pulse: 61 71  Resp: 18 18  Temp: 98.6 F (37 C) 98.5 F (36.9 C)  SpO2: 99% 98%     General: Pt is alert, awake, not in acute distress Cardiovascular: RRR, S1/S2 +, no rubs, no gallops Respiratory: CTA bilaterally, no wheezing, no rhonchi Abdominal: Soft, NT, ND, bowel sounds + Extremities: no edema, no cyanosis    The results of significant diagnostics from this hospitalization (including imaging, microbiology, ancillary and laboratory) are listed below for reference.     Microbiology: Recent Results (from the past 240 hour(s))  Urine culture     Status: Abnormal   Collection Time: 09/19/19  8:29 PM   Specimen: Urine, Random  Result Value Ref Range Status   Specimen Description   Final    URINE, RANDOM Performed at [facility], [address] [place], [place] [address]    Special Requests   Final    NONE Performed at [facility], [address] [place], [place] [address]    Culture MULTIPLE SPECIES PRESENT, SUGGEST RECOLLECTION (A)  Final   Report Status 09/21/2019 FINAL  Final  SARS Coronavirus 2 by RT PCR (hospital order, performed in [facility] hospital lab) Nasopharyngeal Nasopharyngeal Swab     Status: None   Collection Time: 09/20/19  9:04 AM   Specimen: Nasopharyngeal Swab  Result Value Ref Range  Status   SARS Coronavirus 2 NEGATIVE NEGATIVE Final    Comment: (NOTE) SARS-CoV-2 target nucleic acids are NOT DETECTED.  The SARS-CoV-2 RNA is generally detectable in upper and lower respiratory specimens during the acute phase of infection. The lowest concentration of SARS-CoV-2 viral copies this assay can detect is 250 copies / mL. A negative result does not preclude SARS-CoV-2 infection and should not be used as the sole basis for treatment or other patient management decisions.  A negative result may occur with improper specimen collection / handling, submission of specimen other than nasopharyngeal swab, presence of viral mutation(s) within the areas targeted by this assay, and inadequate number of viral copies (<250 copies / mL). A negative result must be combined with clinical observations, patient history, and epidemiological information.  Fact Sheet for Patients:   BoilerBrush.com.cy  Fact Sheet for Healthcare Providers: https://pope.com/  This test is not yet approved or  cleared by the [place] FDA and has been authorized for detection and/or diagnosis of SARS-CoV-2 by FDA under an Emergency Use Authorization (EUA).  This EUA will remain in effect (meaning this test can be used) for the duration of the COVID-19 declaration under Section 564(b)(1) of the Act, 21 U.S.C. section 360bbb-3(b)(1), unless the authorization is terminated or revoked sooner.  Performed at [facility], [address] [place], [place] [address]   SARS CORONAVIRUS 2 (TAT 6-24 HRS) Nasopharyngeal Nasopharyngeal Swab     Status: None   Collection Time: 09/23/19  1:26 PM   Specimen: Nasopharyngeal Swab  Result Value Ref Range Status   SARS Coronavirus 2 NEGATIVE NEGATIVE Final    Comment: (NOTE) SARS-CoV-2 target nucleic acids are NOT DETECTED.  The SARS-CoV-2 RNA is generally detectable in upper and lower respiratory specimens  during the acute phase of infection. Negative results do not preclude SARS-CoV-2 infection, do not rule out co-infections with other pathogens, and should not be used as the sole basis for treatment or other patient management decisions. Negative results must be combined with clinical observations, patient history, and epidemiological information. The expected result is Negative.  Fact Sheet for Patients: HairSlick.no  Fact Sheet for Healthcare Providers: quierodirigir.com  This test is not yet approved or cleared by the [place] FDA and  has been authorized for detection and/or diagnosis of SARS-CoV-2 by FDA under an Emergency Use Authorization (EUA). This EUA will remain  in effect (meaning this test can be used) for the duration of the COVID-19 declaration under Se ction 564(b)(1) of the Act, 21 U.S.C. section 360bbb-3(b)(1), unless the authorization is terminated or revoked sooner.  Performed at [facility], [address], [place], [place] [address]      Labs: BNP (last 3 results) Recent Labs    09/20/19 0530  BNP 34.8   Basic Metabolic Panel: Recent Labs  Lab 09/21/19 0459 09/23/19 0455 09/24/19 0743 09/26/19 1952  NA 142 138 137 140  K 4.0 3.4* 3.8 3.7  CL 106 100 104 102  CO2 29 27 25 26   GLUCOSE 145* 146* 179* 220*  BUN 17 13 11 14   CREATININE 0.69 0.62 0.59 0.58  CALCIUM 8.9 8.8* 8.4* 9.0   Liver Function Tests: No results for input(s): AST, ALT, ALKPHOS, BILITOT, PROT, ALBUMIN in the last 168 hours. No results for input(s): LIPASE, AMYLASE in the last 168 hours. No results for input(s): AMMONIA in the last 168 hours. CBC: Recent Labs  Lab 09/21/19 0459 09/23/19 0455  WBC 8.6 9.3  HGB 11.4* 11.4*  HCT 35.9* 34.3*  MCV 97.8 94.0  PLT 191 196   Cardiac Enzymes: No results for input(s): CKTOTAL, CKMB, CKMBINDEX, TROPONINI in the last 168 hours. BNP: Invalid input(s):  POCBNP CBG: Recent Labs  Lab 09/26/19 1209 09/26/19 1602 09/26/19 1828 09/26/19 2127 09/27/19 0834  GLUCAP 207* 171* 410* 124* 162*   D-Dimer No results for input(s): DDIMER in the last 72 hours. Hgb A1c No results for input(s): HGBA1C in the last 72 hours.  Lipid Profile No results for input(s): CHOL, HDL, LDLCALC, TRIG, CHOLHDL, LDLDIRECT in the last 72 hours. Thyroid function studies No results for input(s): TSH, T4TOTAL, T3FREE, THYROIDAB in the last 72 hours.  Invalid input(s): FREET3 Anemia work up No results for input(s): VITAMINB12, FOLATE, FERRITIN, TIBC, IRON, RETICCTPCT in the last 72 hours. Urinalysis    Component Value Date/Time   COLORURINE YELLOW (A) 09/19/2019 2029   APPEARANCEUR CLOUDY (A) 09/19/2019 2029   LABSPEC 1.021 09/19/2019 2029   PHURINE 5.0 09/19/2019 2029   GLUCOSEU >=500 (A) 09/19/2019 2029   HGBUR SMALL (A) 09/19/2019 2029   BILIRUBINUR NEGATIVE 09/19/2019 2029   BILIRUBINUR neg 08/08/2019 0922   KETONESUR NEGATIVE 09/19/2019 2029   PROTEINUR 100 (A) 09/19/2019 2029   UROBILINOGEN 0.2 08/08/2019 0922   NITRITE POSITIVE (A) 09/19/2019 2029   LEUKOCYTESUR TRACE (A) 09/19/2019 2029   Sepsis Labs Invalid input(s): PROCALCITONIN,  WBC,  LACTICIDVEN Microbiology Recent Results (from the past 240 hour(s))  Urine culture     Status: Abnormal   Collection Time: 09/19/19  8:29 PM   Specimen: Urine, Random  Result Value Ref Range Status   Specimen Description   Final    URINE, RANDOM Performed at [facility], [address], [place], [place] [address]    Special Requests   Final    NONE Performed at [facility], [address] [place], [place] [address]    Culture MULTIPLE SPECIES PRESENT, SUGGEST RECOLLECTION (A)  Final   Report Status 09/21/2019 FINAL  Final  SARS Coronavirus 2 by RT PCR (hospital order, performed in [facility] hospital lab) Nasopharyngeal Nasopharyngeal Swab     Status: None   Collection Time:  09/20/19  9:04 AM   Specimen: Nasopharyngeal Swab  Result Value Ref Range Status   SARS Coronavirus 2 NEGATIVE NEGATIVE Final    Comment: (NOTE) SARS-CoV-2 target nucleic acids are NOT DETECTED.  The SARS-CoV-2 RNA is generally detectable in upper and lower respiratory specimens during the acute phase of infection. The lowest concentration of SARS-CoV-2 viral copies this assay can detect is 250 copies / mL. A negative result does not preclude SARS-CoV-2 infection and should not be used as the sole basis for treatment or other patient management decisions.  A negative result may occur with improper specimen collection / handling, submission of specimen other than nasopharyngeal swab, presence of viral mutation(s) within the areas targeted by this assay, and inadequate number of viral copies (<250 copies / mL). A negative result must be combined with clinical observations, patient history, and epidemiological information.  Fact Sheet for Patients:   BoilerBrush.com.cy  Fact Sheet for Healthcare Providers: https://pope.com/  This test is not yet approved or  cleared by the [place] FDA and has been authorized for detection and/or diagnosis of SARS-CoV-2 by FDA under an Emergency Use Authorization (EUA).  This EUA will remain in effect (meaning this test can be used) for the duration of the COVID-19 declaration under Section 564(b)(1) of the Act, 21 U.S.C. section 360bbb-3(b)(1), unless the authorization is terminated or revoked sooner.  Performed at [facility], [address] [place], [place] [address]   SARS CORONAVIRUS 2 (TAT 6-24 HRS) Nasopharyngeal Nasopharyngeal Swab     Status: None   Collection Time: 09/23/19  1:26 PM   Specimen: Nasopharyngeal Swab  Result Value Ref Range Status   SARS Coronavirus 2 NEGATIVE NEGATIVE Final    Comment: (NOTE) SARS-CoV-2 target nucleic acids are NOT DETECTED.  The  SARS-CoV-2 RNA is  generally detectable in upper and lower respiratory specimens during the acute phase of infection. Negative results do not preclude SARS-CoV-2 infection, do not rule out co-infections with other pathogens, and should not be used as the sole basis for treatment or other patient management decisions. Negative results must be combined with clinical observations, patient history, and epidemiological information. The expected result is Negative.  Fact Sheet for Patients: HairSlick.no  Fact Sheet for Healthcare Providers: quierodirigir.com  This test is not yet approved or cleared by the [place] FDA and  has been authorized for detection and/or diagnosis of SARS-CoV-2 by FDA under an Emergency Use Authorization (EUA). This EUA will remain  in effect (meaning this test can be used) for the duration of the COVID-19 declaration under Se ction 564(b)(1) of the Act, 21 U.S.C. section 360bbb-3(b)(1), unless the authorization is terminated or revoked sooner.  Performed at [facility], [address] [place], [place] [address]      Time coordinating discharge: 40 minutes  SIGNED:   Alba Cory, MD  [facility]

## 2019-09-28 DIAGNOSIS — G8194 Hemiplegia, unspecified affecting left nondominant side: Secondary | ICD-10-CM | POA: Insufficient documentation

## 2019-09-28 DIAGNOSIS — I779 Disorder of arteries and arterioles, unspecified: Secondary | ICD-10-CM | POA: Insufficient documentation

## 2019-09-28 DIAGNOSIS — R531 Weakness: Secondary | ICD-10-CM | POA: Insufficient documentation

## 2019-09-28 DIAGNOSIS — R809 Proteinuria, unspecified: Secondary | ICD-10-CM | POA: Diagnosis not present

## 2019-09-28 DIAGNOSIS — E1129 Type 2 diabetes mellitus with other diabetic kidney complication: Secondary | ICD-10-CM | POA: Diagnosis not present

## 2019-10-06 DIAGNOSIS — R531 Weakness: Secondary | ICD-10-CM | POA: Diagnosis not present

## 2019-10-06 DIAGNOSIS — R4182 Altered mental status, unspecified: Secondary | ICD-10-CM | POA: Diagnosis not present

## 2019-10-06 DIAGNOSIS — Z8673 Personal history of transient ischemic attack (TIA), and cerebral infarction without residual deficits: Secondary | ICD-10-CM | POA: Diagnosis not present

## 2019-10-06 DIAGNOSIS — G8194 Hemiplegia, unspecified affecting left nondominant side: Secondary | ICD-10-CM | POA: Diagnosis not present

## 2019-10-07 DIAGNOSIS — Z8673 Personal history of transient ischemic attack (TIA), and cerebral infarction without residual deficits: Secondary | ICD-10-CM | POA: Insufficient documentation

## 2019-10-14 DIAGNOSIS — M12812 Other specific arthropathies, not elsewhere classified, left shoulder: Secondary | ICD-10-CM | POA: Diagnosis not present

## 2019-10-14 DIAGNOSIS — Z8673 Personal history of transient ischemic attack (TIA), and cerebral infarction without residual deficits: Secondary | ICD-10-CM | POA: Diagnosis not present

## 2019-10-14 DIAGNOSIS — R531 Weakness: Secondary | ICD-10-CM | POA: Diagnosis not present

## 2019-11-08 ENCOUNTER — Ambulatory Visit: Payer: Medicare HMO | Admitting: Obstetrics & Gynecology

## 2019-11-14 ENCOUNTER — Encounter (INDEPENDENT_AMBULATORY_CARE_PROVIDER_SITE_OTHER): Payer: Self-pay | Admitting: Vascular Surgery

## 2019-11-14 ENCOUNTER — Other Ambulatory Visit: Payer: Self-pay

## 2019-11-14 ENCOUNTER — Ambulatory Visit (INDEPENDENT_AMBULATORY_CARE_PROVIDER_SITE_OTHER): Payer: Medicare HMO | Admitting: Vascular Surgery

## 2019-11-14 VITALS — BP 117/67 | HR 70 | Resp 16 | Ht 64.0 in

## 2019-11-14 DIAGNOSIS — I10 Essential (primary) hypertension: Secondary | ICD-10-CM | POA: Diagnosis not present

## 2019-11-14 DIAGNOSIS — I639 Cerebral infarction, unspecified: Secondary | ICD-10-CM

## 2019-11-14 DIAGNOSIS — E1142 Type 2 diabetes mellitus with diabetic polyneuropathy: Secondary | ICD-10-CM | POA: Diagnosis not present

## 2019-11-14 DIAGNOSIS — I6523 Occlusion and stenosis of bilateral carotid arteries: Secondary | ICD-10-CM

## 2019-11-14 DIAGNOSIS — E782 Mixed hyperlipidemia: Secondary | ICD-10-CM | POA: Diagnosis not present

## 2019-11-14 HISTORY — DX: Cerebral infarction, unspecified: I63.9

## 2019-11-14 NOTE — Progress Notes (Signed)
MRN : [id number]  [name] is a 86 y.o. (26-Jun-1933) female who presents with chief complaint of  Chief Complaint  Patient presents with  . New Patient (Initial Visit)    ref Malvin Johns carotid stenosis  .  History of Present Illness:   The patient is seen for evaluation of carotid stenosis. The carotid stenosis was identified after a CT angiogram was obtained secondary to a CVA.  The patient denies amaurosis fugax. There is no recent history of TIA symptoms or focal motor deficits. 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  There is no history of migraine headaches. There is no history of seizures.  The patient is taking enteric-coated aspirin 81 mg daily.  The patient has a history of coronary artery disease, no recent episodes of angina or shortness of breath. The patient denies PAD or claudication symptoms. There is a history of hyperlipidemia which is being treated with a statin.  CT angio of the carotid arteries dated 09/22/2019 is reviewed by me and is consistent with the radiologic interpretation.  The occlusive disease is predominantly located at the carotid bifurcations bilaterally but extending into the external carotid artery.  There is no hemodynamically significant stenosis in the internal carotid arteries bilaterally.  She does have fairly significant intracranial disease.  Current Meds  Medication Sig  . acetaminophen (TYLENOL) 325 MG tablet Take 2 tablets (650 mg total) by mouth every 6 (six) hours as needed for mild pain, moderate pain, fever or headache.  Marland Kitchen aspirin EC 325 MG EC tablet Take 1 tablet (325 mg total) by mouth daily.  Marland Kitchen atorvastatin (LIPITOR) 80 MG tablet Take 1 tablet (80 mg total) by mouth daily.  . clopidogrel (PLAVIX) 75 MG tablet Take 1 tablet (75 mg total) by mouth daily.  Marland Kitchen glucose blood test strip 1 each by Other route as needed for other. Use as instructed  . hydrochlorothiazide (HYDRODIURIL) 12.5 MG tablet Take 12.5 mg by  mouth daily.   Marland Kitchen lisinopril (ZESTRIL) 20 MG tablet Take 20 mg by mouth daily.  . metFORMIN (GLUCOPHAGE) 1000 MG tablet Take 1,000 mg by mouth 2 (two) times daily with a meal.  . vitamin B-12 100 MCG tablet Take 1 tablet (100 mcg total) by mouth daily.    Past Medical History:  Diagnosis Date  . Carpal tunnel syndrome on both sides   . Diabetes (HCC)   . Diabetic polyneuropathy (HCC)   . Diverticulitis   . Hyperlipidemia   . Hypertension   . Osteoarthritis   . Vaginal prolapse     Past Surgical History:  Procedure Laterality Date  . CESAREAN SECTION    . pessary    . REPLACEMENT TOTAL KNEE BILATERAL    . VENTRAL HERNIA REPAIR    . VENTRAL HERNIA REPAIR N/A 01/13/2016   Procedure: HERNIA REPAIR VENTRAL ADULT WITH SMALL BOWEL RESECTION, LYSIS OF ADHESIONS;  Surgeon: Gladis Riffle, MD;  Location: [facility] ORS;  Service: General;  Laterality: N/A;    Social History Social History   Tobacco Use  . Smoking status: Never Smoker  . Smokeless tobacco: Never Used  Vaping Use  . Vaping Use: Never used  Substance Use Topics  . Alcohol use: No  . Drug use: No    Family History Family History  Problem Relation Age of Onset  . Breast cancer Mother 66  . Breast cancer Maternal Aunt   No family history of bleeding/clotting disorders, porphyria or autoimmune disease   No Known Allergies  REVIEW OF SYSTEMS (Negative unless checked)  Constitutional: [] Weight loss  [] Fever  [] Chills Cardiac: [] Chest pain   [] Chest pressure   [] Palpitations   [] Shortness of breath when laying flat   [] Shortness of breath with exertion. Vascular:  [] Pain in legs with walking   [] Pain in legs at rest  [] History of DVT   [] Phlebitis   [] Swelling in legs   [] Varicose veins   [] Non-healing ulcers Pulmonary:   [] Uses home oxygen   [] Productive cough   [] Hemoptysis   [] Wheeze  [] COPD   [] Asthma Neurologic:  [] Dizziness   [] Seizures   [x] History of stroke   [] History of TIA  [] Aphasia   [] Vissual changes    [x] Weakness or numbness in arm   [x] Weakness or numbness in leg Musculoskeletal:   [] Joint swelling   [] Joint pain   [] Low back pain Hematologic:  [] Easy bruising  [] Easy bleeding   [] Hypercoagulable state   [] Anemic Gastrointestinal:  [] Diarrhea   [] Vomiting  [] Gastroesophageal reflux/heartburn   [] Difficulty swallowing. Genitourinary:  [] Chronic kidney disease   [] Difficult urination  [] Frequent urination   [] Blood in urine Skin:  [] Rashes   [] Ulcers  Psychological:  [] History of anxiety   []  History of major depression.  Physical Examination  Vitals:   11/14/19 0917  BP: 117/67  Pulse: 70  Resp: 16  Height: 5\' 4"  (1.626 m)   Body mass index is 28.84 kg/m. Gen: WD/WN, NAD; seen in a wheelchair Head: [place]/AT, No temporalis wasting.  Ear/Nose/Throat: Hearing grossly intact, nares w/o erythema or drainage, poor dentition Eyes: PER, EOMI, sclera nonicteric.  Neck: Supple, no masses.  + Carotid bruit no JVD.  Pulmonary:  Good air movement, clear to auscultation bilaterally, no use of accessory muscles.  Cardiac: RRR, normal S1, S2, no Murmurs. Vascular:  Vessel Right Left  Radial Palpable Palpable  Brachial Palpable Palpable  Carotid Palpable Palpable  Gastrointestinal: soft, non-distended. No guarding/no peritoneal signs.  Musculoskeletal: M/S 5/5 throughout.  No deformity or atrophy.  Neurologic: CN 2-12 intact. Pain and light touch intact in extremities.  Symmetrical.  Speech is fluent. Motor exam as listed above. Psychiatric: Judgment intact, Mood & affect appropriate for pt's clinical situation. Dermatologic: No rashes or ulcers noted.  No changes consistent with cellulitis.   CBC Lab Results  Component Value Date   WBC 9.3 09/23/2019   HGB 11.4 (L) 09/23/2019   HCT 34.3 (L) 09/23/2019   MCV 94.0 09/23/2019   PLT 196 09/23/2019    BMET    Component Value Date/Time   NA 140 09/26/2019 1952   NA 143 09/24/2013 0409   K 3.7 09/26/2019 1952   K 3.6 09/24/2013 0409    CL 102 09/26/2019 1952   CL 108 (H) 09/24/2013 0409   CO2 26 09/26/2019 1952   CO2 27 09/24/2013 0409   GLUCOSE 220 (H) 09/26/2019 1952   GLUCOSE 222 (H) 09/24/2013 0409   BUN 14 09/26/2019 1952   BUN 7 09/24/2013 0409   CREATININE 0.58 09/26/2019 1952   CREATININE 0.77 09/24/2013 0409   CALCIUM 9.0 09/26/2019 1952   CALCIUM 7.7 (L) 09/24/2013 0409   GFRNONAA >60 09/26/2019 1952   GFRNONAA >60 09/24/2013 0409   GFRAA >60 09/26/2019 1952   GFRAA >60 09/24/2013 0409   CrCl cannot be calculated (Patient's most recent lab result is older than the maximum 21 days allowed.).  COAG No results found for: INR, PROTIME  Radiology No results found.   Assessment/Plan 1. Bilateral carotid artery stenosis Recommend:  Given the patient's  asymptomatic subcritical stenosis no further invasive testing or surgery at this time.  Duplex ultrasound shows <50% ICA stenosis bilaterally.  Continue antiplatelet therapy as prescribed Continue management of CAD, HTN and Hyperlipidemia Healthy heart diet,  encouraged exercise at least 4 times per week Follow up in 6 months with duplex ultrasound and physical exam  - VAS US CAROTID; Future  2. Cerebrovascular accident (CVA), unspecified mechanism (HCC) Continue antiplatelet therapy as ordered.  Follow-up with neurology as arranged  3. Essential hypertension Continue antihypertensive medications as already ordered, these medications have been reviewed and there are no changes at this time.  4. Type 2 diabetes mellitus with polyneuropathy (HCC) Continue hypoglycemic medications as already ordered, these medications have been reviewed and there are no changes at this time.  Hgb A1C to be monitored as already arranged by primary service  5. Mixed hyperlipidemia Continue statin as ordered and reviewed, no changes at this time  Levora Dredge, MD  11/14/2019 11:40 AM

## 2019-11-16 DIAGNOSIS — I69351 Hemiplegia and hemiparesis following cerebral infarction affecting right dominant side: Secondary | ICD-10-CM | POA: Diagnosis not present

## 2019-11-16 DIAGNOSIS — M1990 Unspecified osteoarthritis, unspecified site: Secondary | ICD-10-CM | POA: Diagnosis not present

## 2019-11-16 DIAGNOSIS — I10 Essential (primary) hypertension: Secondary | ICD-10-CM | POA: Diagnosis not present

## 2019-11-16 DIAGNOSIS — E1142 Type 2 diabetes mellitus with diabetic polyneuropathy: Secondary | ICD-10-CM | POA: Diagnosis not present

## 2019-11-16 DIAGNOSIS — E785 Hyperlipidemia, unspecified: Secondary | ICD-10-CM | POA: Diagnosis not present

## 2019-11-16 DIAGNOSIS — I2510 Atherosclerotic heart disease of native coronary artery without angina pectoris: Secondary | ICD-10-CM | POA: Diagnosis not present

## 2019-11-16 DIAGNOSIS — Z7984 Long term (current) use of oral hypoglycemic drugs: Secondary | ICD-10-CM | POA: Diagnosis not present

## 2019-11-29 ENCOUNTER — Ambulatory Visit: Payer: Medicare Other | Admitting: Neurology

## 2019-12-05 DIAGNOSIS — E119 Type 2 diabetes mellitus without complications: Secondary | ICD-10-CM | POA: Diagnosis not present

## 2019-12-05 DIAGNOSIS — E063 Autoimmune thyroiditis: Secondary | ICD-10-CM | POA: Diagnosis not present

## 2019-12-10 DIAGNOSIS — E1142 Type 2 diabetes mellitus with diabetic polyneuropathy: Secondary | ICD-10-CM | POA: Diagnosis not present

## 2019-12-10 DIAGNOSIS — M1990 Unspecified osteoarthritis, unspecified site: Secondary | ICD-10-CM | POA: Diagnosis not present

## 2019-12-10 DIAGNOSIS — I2510 Atherosclerotic heart disease of native coronary artery without angina pectoris: Secondary | ICD-10-CM | POA: Diagnosis not present

## 2019-12-10 DIAGNOSIS — I69351 Hemiplegia and hemiparesis following cerebral infarction affecting right dominant side: Secondary | ICD-10-CM | POA: Diagnosis not present

## 2019-12-11 DIAGNOSIS — I10 Essential (primary) hypertension: Secondary | ICD-10-CM | POA: Diagnosis not present

## 2019-12-11 DIAGNOSIS — E46 Unspecified protein-calorie malnutrition: Secondary | ICD-10-CM | POA: Diagnosis not present

## 2019-12-11 DIAGNOSIS — E785 Hyperlipidemia, unspecified: Secondary | ICD-10-CM | POA: Diagnosis not present

## 2019-12-11 DIAGNOSIS — I2510 Atherosclerotic heart disease of native coronary artery without angina pectoris: Secondary | ICD-10-CM | POA: Diagnosis not present

## 2019-12-11 DIAGNOSIS — I69354 Hemiplegia and hemiparesis following cerebral infarction affecting left non-dominant side: Secondary | ICD-10-CM | POA: Diagnosis not present

## 2019-12-11 DIAGNOSIS — M25512 Pain in left shoulder: Secondary | ICD-10-CM | POA: Diagnosis not present

## 2019-12-11 DIAGNOSIS — M1991 Primary osteoarthritis, unspecified site: Secondary | ICD-10-CM | POA: Diagnosis not present

## 2019-12-11 DIAGNOSIS — G5603 Carpal tunnel syndrome, bilateral upper limbs: Secondary | ICD-10-CM | POA: Diagnosis not present

## 2019-12-11 DIAGNOSIS — E1142 Type 2 diabetes mellitus with diabetic polyneuropathy: Secondary | ICD-10-CM | POA: Diagnosis not present

## 2019-12-13 DIAGNOSIS — M25512 Pain in left shoulder: Secondary | ICD-10-CM | POA: Diagnosis not present

## 2019-12-13 DIAGNOSIS — G5603 Carpal tunnel syndrome, bilateral upper limbs: Secondary | ICD-10-CM | POA: Diagnosis not present

## 2019-12-13 DIAGNOSIS — I2510 Atherosclerotic heart disease of native coronary artery without angina pectoris: Secondary | ICD-10-CM | POA: Diagnosis not present

## 2019-12-13 DIAGNOSIS — M1991 Primary osteoarthritis, unspecified site: Secondary | ICD-10-CM | POA: Diagnosis not present

## 2019-12-13 DIAGNOSIS — I10 Essential (primary) hypertension: Secondary | ICD-10-CM | POA: Diagnosis not present

## 2019-12-13 DIAGNOSIS — E1142 Type 2 diabetes mellitus with diabetic polyneuropathy: Secondary | ICD-10-CM | POA: Diagnosis not present

## 2019-12-13 DIAGNOSIS — E46 Unspecified protein-calorie malnutrition: Secondary | ICD-10-CM | POA: Diagnosis not present

## 2019-12-13 DIAGNOSIS — I69354 Hemiplegia and hemiparesis following cerebral infarction affecting left non-dominant side: Secondary | ICD-10-CM | POA: Diagnosis not present

## 2019-12-13 DIAGNOSIS — E785 Hyperlipidemia, unspecified: Secondary | ICD-10-CM | POA: Diagnosis not present

## 2019-12-15 DIAGNOSIS — G5603 Carpal tunnel syndrome, bilateral upper limbs: Secondary | ICD-10-CM | POA: Diagnosis not present

## 2019-12-15 DIAGNOSIS — I10 Essential (primary) hypertension: Secondary | ICD-10-CM | POA: Diagnosis not present

## 2019-12-15 DIAGNOSIS — E1142 Type 2 diabetes mellitus with diabetic polyneuropathy: Secondary | ICD-10-CM | POA: Diagnosis not present

## 2019-12-15 DIAGNOSIS — M1991 Primary osteoarthritis, unspecified site: Secondary | ICD-10-CM | POA: Diagnosis not present

## 2019-12-15 DIAGNOSIS — I69354 Hemiplegia and hemiparesis following cerebral infarction affecting left non-dominant side: Secondary | ICD-10-CM | POA: Diagnosis not present

## 2019-12-15 DIAGNOSIS — E46 Unspecified protein-calorie malnutrition: Secondary | ICD-10-CM | POA: Diagnosis not present

## 2019-12-15 DIAGNOSIS — M25512 Pain in left shoulder: Secondary | ICD-10-CM | POA: Diagnosis not present

## 2019-12-15 DIAGNOSIS — I2510 Atherosclerotic heart disease of native coronary artery without angina pectoris: Secondary | ICD-10-CM | POA: Diagnosis not present

## 2019-12-15 DIAGNOSIS — E785 Hyperlipidemia, unspecified: Secondary | ICD-10-CM | POA: Diagnosis not present

## 2019-12-16 DIAGNOSIS — Z23 Encounter for immunization: Secondary | ICD-10-CM | POA: Diagnosis not present

## 2019-12-16 DIAGNOSIS — E7800 Pure hypercholesterolemia, unspecified: Secondary | ICD-10-CM | POA: Diagnosis not present

## 2019-12-16 DIAGNOSIS — I10 Essential (primary) hypertension: Secondary | ICD-10-CM | POA: Diagnosis not present

## 2019-12-16 DIAGNOSIS — Z09 Encounter for follow-up examination after completed treatment for conditions other than malignant neoplasm: Secondary | ICD-10-CM | POA: Diagnosis not present

## 2019-12-16 DIAGNOSIS — R809 Proteinuria, unspecified: Secondary | ICD-10-CM | POA: Diagnosis not present

## 2019-12-16 DIAGNOSIS — I69354 Hemiplegia and hemiparesis following cerebral infarction affecting left non-dominant side: Secondary | ICD-10-CM | POA: Diagnosis not present

## 2019-12-16 DIAGNOSIS — E1129 Type 2 diabetes mellitus with other diabetic kidney complication: Secondary | ICD-10-CM | POA: Diagnosis not present

## 2019-12-16 DIAGNOSIS — I779 Disorder of arteries and arterioles, unspecified: Secondary | ICD-10-CM | POA: Diagnosis not present

## 2019-12-17 DIAGNOSIS — E1142 Type 2 diabetes mellitus with diabetic polyneuropathy: Secondary | ICD-10-CM | POA: Diagnosis not present

## 2019-12-17 DIAGNOSIS — I2510 Atherosclerotic heart disease of native coronary artery without angina pectoris: Secondary | ICD-10-CM | POA: Diagnosis not present

## 2019-12-17 DIAGNOSIS — M1990 Unspecified osteoarthritis, unspecified site: Secondary | ICD-10-CM | POA: Diagnosis not present

## 2019-12-17 DIAGNOSIS — I10 Essential (primary) hypertension: Secondary | ICD-10-CM | POA: Diagnosis not present

## 2019-12-17 DIAGNOSIS — Z7984 Long term (current) use of oral hypoglycemic drugs: Secondary | ICD-10-CM | POA: Diagnosis not present

## 2019-12-17 DIAGNOSIS — I69351 Hemiplegia and hemiparesis following cerebral infarction affecting right dominant side: Secondary | ICD-10-CM | POA: Diagnosis not present

## 2019-12-17 DIAGNOSIS — E785 Hyperlipidemia, unspecified: Secondary | ICD-10-CM | POA: Diagnosis not present

## 2019-12-19 DIAGNOSIS — I69354 Hemiplegia and hemiparesis following cerebral infarction affecting left non-dominant side: Secondary | ICD-10-CM | POA: Diagnosis not present

## 2019-12-19 DIAGNOSIS — G5603 Carpal tunnel syndrome, bilateral upper limbs: Secondary | ICD-10-CM | POA: Diagnosis not present

## 2019-12-19 DIAGNOSIS — M1991 Primary osteoarthritis, unspecified site: Secondary | ICD-10-CM | POA: Diagnosis not present

## 2019-12-19 DIAGNOSIS — E785 Hyperlipidemia, unspecified: Secondary | ICD-10-CM | POA: Diagnosis not present

## 2019-12-19 DIAGNOSIS — I10 Essential (primary) hypertension: Secondary | ICD-10-CM | POA: Diagnosis not present

## 2019-12-19 DIAGNOSIS — E1142 Type 2 diabetes mellitus with diabetic polyneuropathy: Secondary | ICD-10-CM | POA: Diagnosis not present

## 2019-12-19 DIAGNOSIS — E46 Unspecified protein-calorie malnutrition: Secondary | ICD-10-CM | POA: Diagnosis not present

## 2019-12-19 DIAGNOSIS — M25512 Pain in left shoulder: Secondary | ICD-10-CM | POA: Diagnosis not present

## 2019-12-19 DIAGNOSIS — I2510 Atherosclerotic heart disease of native coronary artery without angina pectoris: Secondary | ICD-10-CM | POA: Diagnosis not present

## 2019-12-20 DIAGNOSIS — M25512 Pain in left shoulder: Secondary | ICD-10-CM | POA: Diagnosis not present

## 2019-12-20 DIAGNOSIS — I69354 Hemiplegia and hemiparesis following cerebral infarction affecting left non-dominant side: Secondary | ICD-10-CM | POA: Diagnosis not present

## 2019-12-20 DIAGNOSIS — I10 Essential (primary) hypertension: Secondary | ICD-10-CM | POA: Diagnosis not present

## 2019-12-20 DIAGNOSIS — E46 Unspecified protein-calorie malnutrition: Secondary | ICD-10-CM | POA: Diagnosis not present

## 2019-12-20 DIAGNOSIS — G5603 Carpal tunnel syndrome, bilateral upper limbs: Secondary | ICD-10-CM | POA: Diagnosis not present

## 2019-12-20 DIAGNOSIS — I2510 Atherosclerotic heart disease of native coronary artery without angina pectoris: Secondary | ICD-10-CM | POA: Diagnosis not present

## 2019-12-20 DIAGNOSIS — M1991 Primary osteoarthritis, unspecified site: Secondary | ICD-10-CM | POA: Diagnosis not present

## 2019-12-20 DIAGNOSIS — E1142 Type 2 diabetes mellitus with diabetic polyneuropathy: Secondary | ICD-10-CM | POA: Diagnosis not present

## 2019-12-20 DIAGNOSIS — E785 Hyperlipidemia, unspecified: Secondary | ICD-10-CM | POA: Diagnosis not present

## 2019-12-22 DIAGNOSIS — E46 Unspecified protein-calorie malnutrition: Secondary | ICD-10-CM | POA: Diagnosis not present

## 2019-12-22 DIAGNOSIS — I2510 Atherosclerotic heart disease of native coronary artery without angina pectoris: Secondary | ICD-10-CM | POA: Diagnosis not present

## 2019-12-22 DIAGNOSIS — E1142 Type 2 diabetes mellitus with diabetic polyneuropathy: Secondary | ICD-10-CM | POA: Diagnosis not present

## 2019-12-22 DIAGNOSIS — I10 Essential (primary) hypertension: Secondary | ICD-10-CM | POA: Diagnosis not present

## 2019-12-22 DIAGNOSIS — I69354 Hemiplegia and hemiparesis following cerebral infarction affecting left non-dominant side: Secondary | ICD-10-CM | POA: Diagnosis not present

## 2019-12-22 DIAGNOSIS — M1991 Primary osteoarthritis, unspecified site: Secondary | ICD-10-CM | POA: Diagnosis not present

## 2019-12-22 DIAGNOSIS — G5603 Carpal tunnel syndrome, bilateral upper limbs: Secondary | ICD-10-CM | POA: Diagnosis not present

## 2019-12-22 DIAGNOSIS — E785 Hyperlipidemia, unspecified: Secondary | ICD-10-CM | POA: Diagnosis not present

## 2019-12-22 DIAGNOSIS — M25512 Pain in left shoulder: Secondary | ICD-10-CM | POA: Diagnosis not present

## 2019-12-26 DIAGNOSIS — G5603 Carpal tunnel syndrome, bilateral upper limbs: Secondary | ICD-10-CM | POA: Diagnosis not present

## 2019-12-26 DIAGNOSIS — E1142 Type 2 diabetes mellitus with diabetic polyneuropathy: Secondary | ICD-10-CM | POA: Diagnosis not present

## 2019-12-26 DIAGNOSIS — I2510 Atherosclerotic heart disease of native coronary artery without angina pectoris: Secondary | ICD-10-CM | POA: Diagnosis not present

## 2019-12-26 DIAGNOSIS — M1991 Primary osteoarthritis, unspecified site: Secondary | ICD-10-CM | POA: Diagnosis not present

## 2019-12-26 DIAGNOSIS — E46 Unspecified protein-calorie malnutrition: Secondary | ICD-10-CM | POA: Diagnosis not present

## 2019-12-26 DIAGNOSIS — E785 Hyperlipidemia, unspecified: Secondary | ICD-10-CM | POA: Diagnosis not present

## 2019-12-26 DIAGNOSIS — I69354 Hemiplegia and hemiparesis following cerebral infarction affecting left non-dominant side: Secondary | ICD-10-CM | POA: Diagnosis not present

## 2019-12-26 DIAGNOSIS — I10 Essential (primary) hypertension: Secondary | ICD-10-CM | POA: Diagnosis not present

## 2019-12-26 DIAGNOSIS — M25512 Pain in left shoulder: Secondary | ICD-10-CM | POA: Diagnosis not present

## 2019-12-27 DIAGNOSIS — I2510 Atherosclerotic heart disease of native coronary artery without angina pectoris: Secondary | ICD-10-CM | POA: Diagnosis not present

## 2019-12-27 DIAGNOSIS — G5603 Carpal tunnel syndrome, bilateral upper limbs: Secondary | ICD-10-CM | POA: Diagnosis not present

## 2019-12-27 DIAGNOSIS — I69354 Hemiplegia and hemiparesis following cerebral infarction affecting left non-dominant side: Secondary | ICD-10-CM | POA: Diagnosis not present

## 2019-12-27 DIAGNOSIS — E46 Unspecified protein-calorie malnutrition: Secondary | ICD-10-CM | POA: Diagnosis not present

## 2019-12-27 DIAGNOSIS — E1142 Type 2 diabetes mellitus with diabetic polyneuropathy: Secondary | ICD-10-CM | POA: Diagnosis not present

## 2019-12-27 DIAGNOSIS — M25512 Pain in left shoulder: Secondary | ICD-10-CM | POA: Diagnosis not present

## 2019-12-27 DIAGNOSIS — M1991 Primary osteoarthritis, unspecified site: Secondary | ICD-10-CM | POA: Diagnosis not present

## 2019-12-27 DIAGNOSIS — E785 Hyperlipidemia, unspecified: Secondary | ICD-10-CM | POA: Diagnosis not present

## 2019-12-27 DIAGNOSIS — I10 Essential (primary) hypertension: Secondary | ICD-10-CM | POA: Diagnosis not present

## 2019-12-29 DIAGNOSIS — M1991 Primary osteoarthritis, unspecified site: Secondary | ICD-10-CM | POA: Diagnosis not present

## 2019-12-29 DIAGNOSIS — E785 Hyperlipidemia, unspecified: Secondary | ICD-10-CM | POA: Diagnosis not present

## 2019-12-29 DIAGNOSIS — G5603 Carpal tunnel syndrome, bilateral upper limbs: Secondary | ICD-10-CM | POA: Diagnosis not present

## 2019-12-29 DIAGNOSIS — E46 Unspecified protein-calorie malnutrition: Secondary | ICD-10-CM | POA: Diagnosis not present

## 2019-12-29 DIAGNOSIS — I2510 Atherosclerotic heart disease of native coronary artery without angina pectoris: Secondary | ICD-10-CM | POA: Diagnosis not present

## 2019-12-29 DIAGNOSIS — E1142 Type 2 diabetes mellitus with diabetic polyneuropathy: Secondary | ICD-10-CM | POA: Diagnosis not present

## 2019-12-29 DIAGNOSIS — I10 Essential (primary) hypertension: Secondary | ICD-10-CM | POA: Diagnosis not present

## 2019-12-29 DIAGNOSIS — I69354 Hemiplegia and hemiparesis following cerebral infarction affecting left non-dominant side: Secondary | ICD-10-CM | POA: Diagnosis not present

## 2019-12-29 DIAGNOSIS — M25512 Pain in left shoulder: Secondary | ICD-10-CM | POA: Diagnosis not present

## 2019-12-30 DIAGNOSIS — G5603 Carpal tunnel syndrome, bilateral upper limbs: Secondary | ICD-10-CM | POA: Diagnosis not present

## 2019-12-30 DIAGNOSIS — M25512 Pain in left shoulder: Secondary | ICD-10-CM | POA: Diagnosis not present

## 2019-12-30 DIAGNOSIS — I2510 Atherosclerotic heart disease of native coronary artery without angina pectoris: Secondary | ICD-10-CM | POA: Diagnosis not present

## 2019-12-30 DIAGNOSIS — I10 Essential (primary) hypertension: Secondary | ICD-10-CM | POA: Diagnosis not present

## 2019-12-30 DIAGNOSIS — E785 Hyperlipidemia, unspecified: Secondary | ICD-10-CM | POA: Diagnosis not present

## 2019-12-30 DIAGNOSIS — E1142 Type 2 diabetes mellitus with diabetic polyneuropathy: Secondary | ICD-10-CM | POA: Diagnosis not present

## 2019-12-30 DIAGNOSIS — M1991 Primary osteoarthritis, unspecified site: Secondary | ICD-10-CM | POA: Diagnosis not present

## 2019-12-30 DIAGNOSIS — I69354 Hemiplegia and hemiparesis following cerebral infarction affecting left non-dominant side: Secondary | ICD-10-CM | POA: Diagnosis not present

## 2019-12-30 DIAGNOSIS — E46 Unspecified protein-calorie malnutrition: Secondary | ICD-10-CM | POA: Diagnosis not present

## 2020-01-02 DIAGNOSIS — I10 Essential (primary) hypertension: Secondary | ICD-10-CM | POA: Diagnosis not present

## 2020-01-02 DIAGNOSIS — E1142 Type 2 diabetes mellitus with diabetic polyneuropathy: Secondary | ICD-10-CM | POA: Diagnosis not present

## 2020-01-02 DIAGNOSIS — I69354 Hemiplegia and hemiparesis following cerebral infarction affecting left non-dominant side: Secondary | ICD-10-CM | POA: Diagnosis not present

## 2020-01-02 DIAGNOSIS — E46 Unspecified protein-calorie malnutrition: Secondary | ICD-10-CM | POA: Diagnosis not present

## 2020-01-02 DIAGNOSIS — G5603 Carpal tunnel syndrome, bilateral upper limbs: Secondary | ICD-10-CM | POA: Diagnosis not present

## 2020-01-02 DIAGNOSIS — E785 Hyperlipidemia, unspecified: Secondary | ICD-10-CM | POA: Diagnosis not present

## 2020-01-02 DIAGNOSIS — I2510 Atherosclerotic heart disease of native coronary artery without angina pectoris: Secondary | ICD-10-CM | POA: Diagnosis not present

## 2020-01-02 DIAGNOSIS — M25512 Pain in left shoulder: Secondary | ICD-10-CM | POA: Diagnosis not present

## 2020-01-02 DIAGNOSIS — M1991 Primary osteoarthritis, unspecified site: Secondary | ICD-10-CM | POA: Diagnosis not present

## 2020-01-03 DIAGNOSIS — E785 Hyperlipidemia, unspecified: Secondary | ICD-10-CM | POA: Diagnosis not present

## 2020-01-03 DIAGNOSIS — M1991 Primary osteoarthritis, unspecified site: Secondary | ICD-10-CM | POA: Diagnosis not present

## 2020-01-03 DIAGNOSIS — E46 Unspecified protein-calorie malnutrition: Secondary | ICD-10-CM | POA: Diagnosis not present

## 2020-01-03 DIAGNOSIS — I69354 Hemiplegia and hemiparesis following cerebral infarction affecting left non-dominant side: Secondary | ICD-10-CM | POA: Diagnosis not present

## 2020-01-03 DIAGNOSIS — I2510 Atherosclerotic heart disease of native coronary artery without angina pectoris: Secondary | ICD-10-CM | POA: Diagnosis not present

## 2020-01-03 DIAGNOSIS — M25512 Pain in left shoulder: Secondary | ICD-10-CM | POA: Diagnosis not present

## 2020-01-03 DIAGNOSIS — I10 Essential (primary) hypertension: Secondary | ICD-10-CM | POA: Diagnosis not present

## 2020-01-03 DIAGNOSIS — G5603 Carpal tunnel syndrome, bilateral upper limbs: Secondary | ICD-10-CM | POA: Diagnosis not present

## 2020-01-03 DIAGNOSIS — E1142 Type 2 diabetes mellitus with diabetic polyneuropathy: Secondary | ICD-10-CM | POA: Diagnosis not present

## 2020-01-06 DIAGNOSIS — M25512 Pain in left shoulder: Secondary | ICD-10-CM | POA: Diagnosis not present

## 2020-01-06 DIAGNOSIS — G5603 Carpal tunnel syndrome, bilateral upper limbs: Secondary | ICD-10-CM | POA: Diagnosis not present

## 2020-01-06 DIAGNOSIS — E785 Hyperlipidemia, unspecified: Secondary | ICD-10-CM | POA: Diagnosis not present

## 2020-01-06 DIAGNOSIS — I10 Essential (primary) hypertension: Secondary | ICD-10-CM | POA: Diagnosis not present

## 2020-01-06 DIAGNOSIS — M1991 Primary osteoarthritis, unspecified site: Secondary | ICD-10-CM | POA: Diagnosis not present

## 2020-01-06 DIAGNOSIS — E46 Unspecified protein-calorie malnutrition: Secondary | ICD-10-CM | POA: Diagnosis not present

## 2020-01-06 DIAGNOSIS — I2510 Atherosclerotic heart disease of native coronary artery without angina pectoris: Secondary | ICD-10-CM | POA: Diagnosis not present

## 2020-01-06 DIAGNOSIS — I69354 Hemiplegia and hemiparesis following cerebral infarction affecting left non-dominant side: Secondary | ICD-10-CM | POA: Diagnosis not present

## 2020-01-06 DIAGNOSIS — E1142 Type 2 diabetes mellitus with diabetic polyneuropathy: Secondary | ICD-10-CM | POA: Diagnosis not present

## 2020-01-10 DIAGNOSIS — E46 Unspecified protein-calorie malnutrition: Secondary | ICD-10-CM | POA: Diagnosis not present

## 2020-01-10 DIAGNOSIS — M25512 Pain in left shoulder: Secondary | ICD-10-CM | POA: Diagnosis not present

## 2020-01-10 DIAGNOSIS — G5603 Carpal tunnel syndrome, bilateral upper limbs: Secondary | ICD-10-CM | POA: Diagnosis not present

## 2020-01-10 DIAGNOSIS — I69354 Hemiplegia and hemiparesis following cerebral infarction affecting left non-dominant side: Secondary | ICD-10-CM | POA: Diagnosis not present

## 2020-01-10 DIAGNOSIS — I2510 Atherosclerotic heart disease of native coronary artery without angina pectoris: Secondary | ICD-10-CM | POA: Diagnosis not present

## 2020-01-10 DIAGNOSIS — E1142 Type 2 diabetes mellitus with diabetic polyneuropathy: Secondary | ICD-10-CM | POA: Diagnosis not present

## 2020-01-10 DIAGNOSIS — M1991 Primary osteoarthritis, unspecified site: Secondary | ICD-10-CM | POA: Diagnosis not present

## 2020-01-10 DIAGNOSIS — E785 Hyperlipidemia, unspecified: Secondary | ICD-10-CM | POA: Diagnosis not present

## 2020-01-10 DIAGNOSIS — I10 Essential (primary) hypertension: Secondary | ICD-10-CM | POA: Diagnosis not present

## 2020-01-12 DIAGNOSIS — E785 Hyperlipidemia, unspecified: Secondary | ICD-10-CM | POA: Diagnosis not present

## 2020-01-12 DIAGNOSIS — M25512 Pain in left shoulder: Secondary | ICD-10-CM | POA: Diagnosis not present

## 2020-01-12 DIAGNOSIS — E1142 Type 2 diabetes mellitus with diabetic polyneuropathy: Secondary | ICD-10-CM | POA: Diagnosis not present

## 2020-01-12 DIAGNOSIS — I69354 Hemiplegia and hemiparesis following cerebral infarction affecting left non-dominant side: Secondary | ICD-10-CM | POA: Diagnosis not present

## 2020-01-12 DIAGNOSIS — M1991 Primary osteoarthritis, unspecified site: Secondary | ICD-10-CM | POA: Diagnosis not present

## 2020-01-12 DIAGNOSIS — I2510 Atherosclerotic heart disease of native coronary artery without angina pectoris: Secondary | ICD-10-CM | POA: Diagnosis not present

## 2020-01-12 DIAGNOSIS — I10 Essential (primary) hypertension: Secondary | ICD-10-CM | POA: Diagnosis not present

## 2020-01-12 DIAGNOSIS — E46 Unspecified protein-calorie malnutrition: Secondary | ICD-10-CM | POA: Diagnosis not present

## 2020-01-12 DIAGNOSIS — G5603 Carpal tunnel syndrome, bilateral upper limbs: Secondary | ICD-10-CM | POA: Diagnosis not present

## 2020-01-13 DIAGNOSIS — M24542 Contracture, left hand: Secondary | ICD-10-CM | POA: Diagnosis not present

## 2020-01-13 DIAGNOSIS — R531 Weakness: Secondary | ICD-10-CM | POA: Diagnosis not present

## 2020-01-13 DIAGNOSIS — Z8673 Personal history of transient ischemic attack (TIA), and cerebral infarction without residual deficits: Secondary | ICD-10-CM | POA: Diagnosis not present

## 2020-01-13 DIAGNOSIS — M12812 Other specific arthropathies, not elsewhere classified, left shoulder: Secondary | ICD-10-CM | POA: Diagnosis not present

## 2020-01-16 DIAGNOSIS — E1142 Type 2 diabetes mellitus with diabetic polyneuropathy: Secondary | ICD-10-CM | POA: Diagnosis not present

## 2020-01-16 DIAGNOSIS — I69351 Hemiplegia and hemiparesis following cerebral infarction affecting right dominant side: Secondary | ICD-10-CM | POA: Diagnosis not present

## 2020-01-16 DIAGNOSIS — M1990 Unspecified osteoarthritis, unspecified site: Secondary | ICD-10-CM | POA: Diagnosis not present

## 2020-01-16 DIAGNOSIS — I69354 Hemiplegia and hemiparesis following cerebral infarction affecting left non-dominant side: Secondary | ICD-10-CM | POA: Diagnosis not present

## 2020-01-16 DIAGNOSIS — I2510 Atherosclerotic heart disease of native coronary artery without angina pectoris: Secondary | ICD-10-CM | POA: Diagnosis not present

## 2020-01-16 DIAGNOSIS — M25512 Pain in left shoulder: Secondary | ICD-10-CM | POA: Diagnosis not present

## 2020-01-16 DIAGNOSIS — I10 Essential (primary) hypertension: Secondary | ICD-10-CM | POA: Diagnosis not present

## 2020-01-16 DIAGNOSIS — G5603 Carpal tunnel syndrome, bilateral upper limbs: Secondary | ICD-10-CM | POA: Diagnosis not present

## 2020-01-16 DIAGNOSIS — M1991 Primary osteoarthritis, unspecified site: Secondary | ICD-10-CM | POA: Diagnosis not present

## 2020-01-16 DIAGNOSIS — E785 Hyperlipidemia, unspecified: Secondary | ICD-10-CM | POA: Diagnosis not present

## 2020-01-16 DIAGNOSIS — E46 Unspecified protein-calorie malnutrition: Secondary | ICD-10-CM | POA: Diagnosis not present

## 2020-01-16 DIAGNOSIS — Z7984 Long term (current) use of oral hypoglycemic drugs: Secondary | ICD-10-CM | POA: Diagnosis not present

## 2020-01-17 DIAGNOSIS — I10 Essential (primary) hypertension: Secondary | ICD-10-CM | POA: Diagnosis not present

## 2020-01-17 DIAGNOSIS — I69354 Hemiplegia and hemiparesis following cerebral infarction affecting left non-dominant side: Secondary | ICD-10-CM | POA: Diagnosis not present

## 2020-01-17 DIAGNOSIS — E46 Unspecified protein-calorie malnutrition: Secondary | ICD-10-CM | POA: Diagnosis not present

## 2020-01-17 DIAGNOSIS — E1142 Type 2 diabetes mellitus with diabetic polyneuropathy: Secondary | ICD-10-CM | POA: Diagnosis not present

## 2020-01-17 DIAGNOSIS — M1991 Primary osteoarthritis, unspecified site: Secondary | ICD-10-CM | POA: Diagnosis not present

## 2020-01-17 DIAGNOSIS — I2510 Atherosclerotic heart disease of native coronary artery without angina pectoris: Secondary | ICD-10-CM | POA: Diagnosis not present

## 2020-01-17 DIAGNOSIS — M25512 Pain in left shoulder: Secondary | ICD-10-CM | POA: Diagnosis not present

## 2020-01-17 DIAGNOSIS — E785 Hyperlipidemia, unspecified: Secondary | ICD-10-CM | POA: Diagnosis not present

## 2020-01-17 DIAGNOSIS — G5603 Carpal tunnel syndrome, bilateral upper limbs: Secondary | ICD-10-CM | POA: Diagnosis not present

## 2020-01-18 DIAGNOSIS — I2510 Atherosclerotic heart disease of native coronary artery without angina pectoris: Secondary | ICD-10-CM | POA: Diagnosis not present

## 2020-01-18 DIAGNOSIS — G5603 Carpal tunnel syndrome, bilateral upper limbs: Secondary | ICD-10-CM | POA: Diagnosis not present

## 2020-01-18 DIAGNOSIS — E785 Hyperlipidemia, unspecified: Secondary | ICD-10-CM | POA: Diagnosis not present

## 2020-01-18 DIAGNOSIS — M1991 Primary osteoarthritis, unspecified site: Secondary | ICD-10-CM | POA: Diagnosis not present

## 2020-01-18 DIAGNOSIS — M25512 Pain in left shoulder: Secondary | ICD-10-CM | POA: Diagnosis not present

## 2020-01-18 DIAGNOSIS — I69354 Hemiplegia and hemiparesis following cerebral infarction affecting left non-dominant side: Secondary | ICD-10-CM | POA: Diagnosis not present

## 2020-01-18 DIAGNOSIS — E46 Unspecified protein-calorie malnutrition: Secondary | ICD-10-CM | POA: Diagnosis not present

## 2020-01-18 DIAGNOSIS — I10 Essential (primary) hypertension: Secondary | ICD-10-CM | POA: Diagnosis not present

## 2020-01-18 DIAGNOSIS — E1142 Type 2 diabetes mellitus with diabetic polyneuropathy: Secondary | ICD-10-CM | POA: Diagnosis not present

## 2020-01-19 DIAGNOSIS — E46 Unspecified protein-calorie malnutrition: Secondary | ICD-10-CM | POA: Diagnosis not present

## 2020-01-19 DIAGNOSIS — I2510 Atherosclerotic heart disease of native coronary artery without angina pectoris: Secondary | ICD-10-CM | POA: Diagnosis not present

## 2020-01-19 DIAGNOSIS — I69354 Hemiplegia and hemiparesis following cerebral infarction affecting left non-dominant side: Secondary | ICD-10-CM | POA: Diagnosis not present

## 2020-01-19 DIAGNOSIS — M25512 Pain in left shoulder: Secondary | ICD-10-CM | POA: Diagnosis not present

## 2020-01-19 DIAGNOSIS — E1142 Type 2 diabetes mellitus with diabetic polyneuropathy: Secondary | ICD-10-CM | POA: Diagnosis not present

## 2020-01-19 DIAGNOSIS — I10 Essential (primary) hypertension: Secondary | ICD-10-CM | POA: Diagnosis not present

## 2020-01-19 DIAGNOSIS — M1991 Primary osteoarthritis, unspecified site: Secondary | ICD-10-CM | POA: Diagnosis not present

## 2020-01-19 DIAGNOSIS — G5603 Carpal tunnel syndrome, bilateral upper limbs: Secondary | ICD-10-CM | POA: Diagnosis not present

## 2020-01-19 DIAGNOSIS — E785 Hyperlipidemia, unspecified: Secondary | ICD-10-CM | POA: Diagnosis not present

## 2020-01-24 DIAGNOSIS — I69354 Hemiplegia and hemiparesis following cerebral infarction affecting left non-dominant side: Secondary | ICD-10-CM | POA: Diagnosis not present

## 2020-01-24 DIAGNOSIS — E1142 Type 2 diabetes mellitus with diabetic polyneuropathy: Secondary | ICD-10-CM | POA: Diagnosis not present

## 2020-01-24 DIAGNOSIS — E785 Hyperlipidemia, unspecified: Secondary | ICD-10-CM | POA: Diagnosis not present

## 2020-01-24 DIAGNOSIS — M1991 Primary osteoarthritis, unspecified site: Secondary | ICD-10-CM | POA: Diagnosis not present

## 2020-01-24 DIAGNOSIS — G5603 Carpal tunnel syndrome, bilateral upper limbs: Secondary | ICD-10-CM | POA: Diagnosis not present

## 2020-01-24 DIAGNOSIS — E46 Unspecified protein-calorie malnutrition: Secondary | ICD-10-CM | POA: Diagnosis not present

## 2020-01-24 DIAGNOSIS — I10 Essential (primary) hypertension: Secondary | ICD-10-CM | POA: Diagnosis not present

## 2020-01-24 DIAGNOSIS — M25512 Pain in left shoulder: Secondary | ICD-10-CM | POA: Diagnosis not present

## 2020-01-24 DIAGNOSIS — I2510 Atherosclerotic heart disease of native coronary artery without angina pectoris: Secondary | ICD-10-CM | POA: Diagnosis not present

## 2020-01-26 DIAGNOSIS — G5603 Carpal tunnel syndrome, bilateral upper limbs: Secondary | ICD-10-CM | POA: Diagnosis not present

## 2020-01-26 DIAGNOSIS — I10 Essential (primary) hypertension: Secondary | ICD-10-CM | POA: Diagnosis not present

## 2020-01-26 DIAGNOSIS — M1991 Primary osteoarthritis, unspecified site: Secondary | ICD-10-CM | POA: Diagnosis not present

## 2020-01-26 DIAGNOSIS — I2510 Atherosclerotic heart disease of native coronary artery without angina pectoris: Secondary | ICD-10-CM | POA: Diagnosis not present

## 2020-01-26 DIAGNOSIS — E1142 Type 2 diabetes mellitus with diabetic polyneuropathy: Secondary | ICD-10-CM | POA: Diagnosis not present

## 2020-01-26 DIAGNOSIS — E46 Unspecified protein-calorie malnutrition: Secondary | ICD-10-CM | POA: Diagnosis not present

## 2020-01-26 DIAGNOSIS — M25512 Pain in left shoulder: Secondary | ICD-10-CM | POA: Diagnosis not present

## 2020-01-26 DIAGNOSIS — I69354 Hemiplegia and hemiparesis following cerebral infarction affecting left non-dominant side: Secondary | ICD-10-CM | POA: Diagnosis not present

## 2020-01-26 DIAGNOSIS — E785 Hyperlipidemia, unspecified: Secondary | ICD-10-CM | POA: Diagnosis not present

## 2020-01-27 DIAGNOSIS — M25512 Pain in left shoulder: Secondary | ICD-10-CM | POA: Diagnosis not present

## 2020-01-27 DIAGNOSIS — E1142 Type 2 diabetes mellitus with diabetic polyneuropathy: Secondary | ICD-10-CM | POA: Diagnosis not present

## 2020-01-27 DIAGNOSIS — E785 Hyperlipidemia, unspecified: Secondary | ICD-10-CM | POA: Diagnosis not present

## 2020-01-27 DIAGNOSIS — I10 Essential (primary) hypertension: Secondary | ICD-10-CM | POA: Diagnosis not present

## 2020-01-27 DIAGNOSIS — E46 Unspecified protein-calorie malnutrition: Secondary | ICD-10-CM | POA: Diagnosis not present

## 2020-01-27 DIAGNOSIS — G5603 Carpal tunnel syndrome, bilateral upper limbs: Secondary | ICD-10-CM | POA: Diagnosis not present

## 2020-01-27 DIAGNOSIS — I69354 Hemiplegia and hemiparesis following cerebral infarction affecting left non-dominant side: Secondary | ICD-10-CM | POA: Diagnosis not present

## 2020-01-27 DIAGNOSIS — M1991 Primary osteoarthritis, unspecified site: Secondary | ICD-10-CM | POA: Diagnosis not present

## 2020-01-27 DIAGNOSIS — I2510 Atherosclerotic heart disease of native coronary artery without angina pectoris: Secondary | ICD-10-CM | POA: Diagnosis not present

## 2020-01-30 DIAGNOSIS — E785 Hyperlipidemia, unspecified: Secondary | ICD-10-CM | POA: Diagnosis not present

## 2020-01-30 DIAGNOSIS — G5603 Carpal tunnel syndrome, bilateral upper limbs: Secondary | ICD-10-CM | POA: Diagnosis not present

## 2020-01-30 DIAGNOSIS — E1142 Type 2 diabetes mellitus with diabetic polyneuropathy: Secondary | ICD-10-CM | POA: Diagnosis not present

## 2020-01-30 DIAGNOSIS — I10 Essential (primary) hypertension: Secondary | ICD-10-CM | POA: Diagnosis not present

## 2020-01-30 DIAGNOSIS — I69354 Hemiplegia and hemiparesis following cerebral infarction affecting left non-dominant side: Secondary | ICD-10-CM | POA: Diagnosis not present

## 2020-01-30 DIAGNOSIS — I2510 Atherosclerotic heart disease of native coronary artery without angina pectoris: Secondary | ICD-10-CM | POA: Diagnosis not present

## 2020-01-30 DIAGNOSIS — M1991 Primary osteoarthritis, unspecified site: Secondary | ICD-10-CM | POA: Diagnosis not present

## 2020-01-30 DIAGNOSIS — E46 Unspecified protein-calorie malnutrition: Secondary | ICD-10-CM | POA: Diagnosis not present

## 2020-01-30 DIAGNOSIS — M25512 Pain in left shoulder: Secondary | ICD-10-CM | POA: Diagnosis not present

## 2020-01-31 DIAGNOSIS — I69354 Hemiplegia and hemiparesis following cerebral infarction affecting left non-dominant side: Secondary | ICD-10-CM | POA: Diagnosis not present

## 2020-01-31 DIAGNOSIS — E785 Hyperlipidemia, unspecified: Secondary | ICD-10-CM | POA: Diagnosis not present

## 2020-01-31 DIAGNOSIS — M1991 Primary osteoarthritis, unspecified site: Secondary | ICD-10-CM | POA: Diagnosis not present

## 2020-01-31 DIAGNOSIS — I10 Essential (primary) hypertension: Secondary | ICD-10-CM | POA: Diagnosis not present

## 2020-01-31 DIAGNOSIS — E46 Unspecified protein-calorie malnutrition: Secondary | ICD-10-CM | POA: Diagnosis not present

## 2020-01-31 DIAGNOSIS — I2510 Atherosclerotic heart disease of native coronary artery without angina pectoris: Secondary | ICD-10-CM | POA: Diagnosis not present

## 2020-01-31 DIAGNOSIS — E1142 Type 2 diabetes mellitus with diabetic polyneuropathy: Secondary | ICD-10-CM | POA: Diagnosis not present

## 2020-01-31 DIAGNOSIS — G5603 Carpal tunnel syndrome, bilateral upper limbs: Secondary | ICD-10-CM | POA: Diagnosis not present

## 2020-01-31 DIAGNOSIS — M25512 Pain in left shoulder: Secondary | ICD-10-CM | POA: Diagnosis not present

## 2020-02-01 DIAGNOSIS — E1142 Type 2 diabetes mellitus with diabetic polyneuropathy: Secondary | ICD-10-CM | POA: Diagnosis not present

## 2020-02-01 DIAGNOSIS — G5603 Carpal tunnel syndrome, bilateral upper limbs: Secondary | ICD-10-CM | POA: Diagnosis not present

## 2020-02-01 DIAGNOSIS — I69354 Hemiplegia and hemiparesis following cerebral infarction affecting left non-dominant side: Secondary | ICD-10-CM | POA: Diagnosis not present

## 2020-02-01 DIAGNOSIS — E785 Hyperlipidemia, unspecified: Secondary | ICD-10-CM | POA: Diagnosis not present

## 2020-02-01 DIAGNOSIS — M1991 Primary osteoarthritis, unspecified site: Secondary | ICD-10-CM | POA: Diagnosis not present

## 2020-02-01 DIAGNOSIS — I2510 Atherosclerotic heart disease of native coronary artery without angina pectoris: Secondary | ICD-10-CM | POA: Diagnosis not present

## 2020-02-01 DIAGNOSIS — I10 Essential (primary) hypertension: Secondary | ICD-10-CM | POA: Diagnosis not present

## 2020-02-01 DIAGNOSIS — M25512 Pain in left shoulder: Secondary | ICD-10-CM | POA: Diagnosis not present

## 2020-02-01 DIAGNOSIS — E46 Unspecified protein-calorie malnutrition: Secondary | ICD-10-CM | POA: Diagnosis not present

## 2020-02-06 DIAGNOSIS — E46 Unspecified protein-calorie malnutrition: Secondary | ICD-10-CM | POA: Diagnosis not present

## 2020-02-06 DIAGNOSIS — M1991 Primary osteoarthritis, unspecified site: Secondary | ICD-10-CM | POA: Diagnosis not present

## 2020-02-06 DIAGNOSIS — G5603 Carpal tunnel syndrome, bilateral upper limbs: Secondary | ICD-10-CM | POA: Diagnosis not present

## 2020-02-06 DIAGNOSIS — E785 Hyperlipidemia, unspecified: Secondary | ICD-10-CM | POA: Diagnosis not present

## 2020-02-06 DIAGNOSIS — I69354 Hemiplegia and hemiparesis following cerebral infarction affecting left non-dominant side: Secondary | ICD-10-CM | POA: Diagnosis not present

## 2020-02-06 DIAGNOSIS — I2510 Atherosclerotic heart disease of native coronary artery without angina pectoris: Secondary | ICD-10-CM | POA: Diagnosis not present

## 2020-02-06 DIAGNOSIS — M25512 Pain in left shoulder: Secondary | ICD-10-CM | POA: Diagnosis not present

## 2020-02-06 DIAGNOSIS — E1142 Type 2 diabetes mellitus with diabetic polyneuropathy: Secondary | ICD-10-CM | POA: Diagnosis not present

## 2020-02-06 DIAGNOSIS — I10 Essential (primary) hypertension: Secondary | ICD-10-CM | POA: Diagnosis not present

## 2020-02-07 DIAGNOSIS — I69354 Hemiplegia and hemiparesis following cerebral infarction affecting left non-dominant side: Secondary | ICD-10-CM | POA: Diagnosis not present

## 2020-02-07 DIAGNOSIS — M25512 Pain in left shoulder: Secondary | ICD-10-CM | POA: Diagnosis not present

## 2020-02-07 DIAGNOSIS — M1991 Primary osteoarthritis, unspecified site: Secondary | ICD-10-CM | POA: Diagnosis not present

## 2020-02-07 DIAGNOSIS — I2510 Atherosclerotic heart disease of native coronary artery without angina pectoris: Secondary | ICD-10-CM | POA: Diagnosis not present

## 2020-02-07 DIAGNOSIS — E785 Hyperlipidemia, unspecified: Secondary | ICD-10-CM | POA: Diagnosis not present

## 2020-02-07 DIAGNOSIS — I10 Essential (primary) hypertension: Secondary | ICD-10-CM | POA: Diagnosis not present

## 2020-02-07 DIAGNOSIS — G5603 Carpal tunnel syndrome, bilateral upper limbs: Secondary | ICD-10-CM | POA: Diagnosis not present

## 2020-02-07 DIAGNOSIS — E46 Unspecified protein-calorie malnutrition: Secondary | ICD-10-CM | POA: Diagnosis not present

## 2020-02-07 DIAGNOSIS — E1142 Type 2 diabetes mellitus with diabetic polyneuropathy: Secondary | ICD-10-CM | POA: Diagnosis not present

## 2020-02-09 DIAGNOSIS — E785 Hyperlipidemia, unspecified: Secondary | ICD-10-CM | POA: Diagnosis not present

## 2020-02-09 DIAGNOSIS — E46 Unspecified protein-calorie malnutrition: Secondary | ICD-10-CM | POA: Diagnosis not present

## 2020-02-09 DIAGNOSIS — E1142 Type 2 diabetes mellitus with diabetic polyneuropathy: Secondary | ICD-10-CM | POA: Diagnosis not present

## 2020-02-09 DIAGNOSIS — M1991 Primary osteoarthritis, unspecified site: Secondary | ICD-10-CM | POA: Diagnosis not present

## 2020-02-09 DIAGNOSIS — I10 Essential (primary) hypertension: Secondary | ICD-10-CM | POA: Diagnosis not present

## 2020-02-09 DIAGNOSIS — M25512 Pain in left shoulder: Secondary | ICD-10-CM | POA: Diagnosis not present

## 2020-02-09 DIAGNOSIS — I2510 Atherosclerotic heart disease of native coronary artery without angina pectoris: Secondary | ICD-10-CM | POA: Diagnosis not present

## 2020-02-09 DIAGNOSIS — I69354 Hemiplegia and hemiparesis following cerebral infarction affecting left non-dominant side: Secondary | ICD-10-CM | POA: Diagnosis not present

## 2020-02-09 DIAGNOSIS — G5603 Carpal tunnel syndrome, bilateral upper limbs: Secondary | ICD-10-CM | POA: Diagnosis not present

## 2020-02-15 DIAGNOSIS — I2510 Atherosclerotic heart disease of native coronary artery without angina pectoris: Secondary | ICD-10-CM | POA: Diagnosis not present

## 2020-02-15 DIAGNOSIS — G5603 Carpal tunnel syndrome, bilateral upper limbs: Secondary | ICD-10-CM | POA: Diagnosis not present

## 2020-02-15 DIAGNOSIS — I69354 Hemiplegia and hemiparesis following cerebral infarction affecting left non-dominant side: Secondary | ICD-10-CM | POA: Diagnosis not present

## 2020-02-15 DIAGNOSIS — E1142 Type 2 diabetes mellitus with diabetic polyneuropathy: Secondary | ICD-10-CM | POA: Diagnosis not present

## 2020-02-15 DIAGNOSIS — I10 Essential (primary) hypertension: Secondary | ICD-10-CM | POA: Diagnosis not present

## 2020-02-15 DIAGNOSIS — E46 Unspecified protein-calorie malnutrition: Secondary | ICD-10-CM | POA: Diagnosis not present

## 2020-02-15 DIAGNOSIS — M25512 Pain in left shoulder: Secondary | ICD-10-CM | POA: Diagnosis not present

## 2020-02-15 DIAGNOSIS — E785 Hyperlipidemia, unspecified: Secondary | ICD-10-CM | POA: Diagnosis not present

## 2020-02-15 DIAGNOSIS — M1991 Primary osteoarthritis, unspecified site: Secondary | ICD-10-CM | POA: Diagnosis not present

## 2020-02-16 DIAGNOSIS — Z7984 Long term (current) use of oral hypoglycemic drugs: Secondary | ICD-10-CM | POA: Diagnosis not present

## 2020-02-16 DIAGNOSIS — I69354 Hemiplegia and hemiparesis following cerebral infarction affecting left non-dominant side: Secondary | ICD-10-CM | POA: Diagnosis not present

## 2020-02-16 DIAGNOSIS — M1990 Unspecified osteoarthritis, unspecified site: Secondary | ICD-10-CM | POA: Diagnosis not present

## 2020-02-16 DIAGNOSIS — M25512 Pain in left shoulder: Secondary | ICD-10-CM | POA: Diagnosis not present

## 2020-02-16 DIAGNOSIS — I2510 Atherosclerotic heart disease of native coronary artery without angina pectoris: Secondary | ICD-10-CM | POA: Diagnosis not present

## 2020-02-16 DIAGNOSIS — E785 Hyperlipidemia, unspecified: Secondary | ICD-10-CM | POA: Diagnosis not present

## 2020-02-16 DIAGNOSIS — E46 Unspecified protein-calorie malnutrition: Secondary | ICD-10-CM | POA: Diagnosis not present

## 2020-02-16 DIAGNOSIS — G5603 Carpal tunnel syndrome, bilateral upper limbs: Secondary | ICD-10-CM | POA: Diagnosis not present

## 2020-02-16 DIAGNOSIS — I10 Essential (primary) hypertension: Secondary | ICD-10-CM | POA: Diagnosis not present

## 2020-02-16 DIAGNOSIS — M1991 Primary osteoarthritis, unspecified site: Secondary | ICD-10-CM | POA: Diagnosis not present

## 2020-02-16 DIAGNOSIS — E1142 Type 2 diabetes mellitus with diabetic polyneuropathy: Secondary | ICD-10-CM | POA: Diagnosis not present

## 2020-02-16 DIAGNOSIS — I69351 Hemiplegia and hemiparesis following cerebral infarction affecting right dominant side: Secondary | ICD-10-CM | POA: Diagnosis not present

## 2020-02-20 DIAGNOSIS — Z8639 Personal history of other endocrine, nutritional and metabolic disease: Secondary | ICD-10-CM | POA: Diagnosis not present

## 2020-02-20 DIAGNOSIS — E1142 Type 2 diabetes mellitus with diabetic polyneuropathy: Secondary | ICD-10-CM | POA: Diagnosis not present

## 2020-02-20 DIAGNOSIS — R531 Weakness: Secondary | ICD-10-CM | POA: Diagnosis not present

## 2020-02-20 DIAGNOSIS — R809 Proteinuria, unspecified: Secondary | ICD-10-CM | POA: Diagnosis not present

## 2020-02-20 DIAGNOSIS — R4182 Altered mental status, unspecified: Secondary | ICD-10-CM | POA: Diagnosis not present

## 2020-02-20 DIAGNOSIS — E785 Hyperlipidemia, unspecified: Secondary | ICD-10-CM | POA: Diagnosis not present

## 2020-02-20 DIAGNOSIS — E46 Unspecified protein-calorie malnutrition: Secondary | ICD-10-CM | POA: Diagnosis not present

## 2020-02-20 DIAGNOSIS — G5603 Carpal tunnel syndrome, bilateral upper limbs: Secondary | ICD-10-CM | POA: Diagnosis not present

## 2020-02-20 DIAGNOSIS — I2510 Atherosclerotic heart disease of native coronary artery without angina pectoris: Secondary | ICD-10-CM | POA: Diagnosis not present

## 2020-02-20 DIAGNOSIS — G8194 Hemiplegia, unspecified affecting left nondominant side: Secondary | ICD-10-CM | POA: Diagnosis not present

## 2020-02-20 DIAGNOSIS — M25512 Pain in left shoulder: Secondary | ICD-10-CM | POA: Diagnosis not present

## 2020-02-20 DIAGNOSIS — M1991 Primary osteoarthritis, unspecified site: Secondary | ICD-10-CM | POA: Diagnosis not present

## 2020-02-20 DIAGNOSIS — I10 Essential (primary) hypertension: Secondary | ICD-10-CM | POA: Diagnosis not present

## 2020-02-20 DIAGNOSIS — E1129 Type 2 diabetes mellitus with other diabetic kidney complication: Secondary | ICD-10-CM | POA: Diagnosis not present

## 2020-02-20 DIAGNOSIS — I69354 Hemiplegia and hemiparesis following cerebral infarction affecting left non-dominant side: Secondary | ICD-10-CM | POA: Diagnosis not present

## 2020-02-20 DIAGNOSIS — Z8673 Personal history of transient ischemic attack (TIA), and cerebral infarction without residual deficits: Secondary | ICD-10-CM | POA: Diagnosis not present

## 2020-02-21 DIAGNOSIS — M25512 Pain in left shoulder: Secondary | ICD-10-CM | POA: Diagnosis not present

## 2020-02-21 DIAGNOSIS — G5603 Carpal tunnel syndrome, bilateral upper limbs: Secondary | ICD-10-CM | POA: Diagnosis not present

## 2020-02-21 DIAGNOSIS — M1991 Primary osteoarthritis, unspecified site: Secondary | ICD-10-CM | POA: Diagnosis not present

## 2020-02-21 DIAGNOSIS — I10 Essential (primary) hypertension: Secondary | ICD-10-CM | POA: Diagnosis not present

## 2020-02-21 DIAGNOSIS — I69354 Hemiplegia and hemiparesis following cerebral infarction affecting left non-dominant side: Secondary | ICD-10-CM | POA: Diagnosis not present

## 2020-02-21 DIAGNOSIS — E1142 Type 2 diabetes mellitus with diabetic polyneuropathy: Secondary | ICD-10-CM | POA: Diagnosis not present

## 2020-02-21 DIAGNOSIS — E46 Unspecified protein-calorie malnutrition: Secondary | ICD-10-CM | POA: Diagnosis not present

## 2020-02-21 DIAGNOSIS — I2510 Atherosclerotic heart disease of native coronary artery without angina pectoris: Secondary | ICD-10-CM | POA: Diagnosis not present

## 2020-02-21 DIAGNOSIS — E785 Hyperlipidemia, unspecified: Secondary | ICD-10-CM | POA: Diagnosis not present

## 2020-02-22 DIAGNOSIS — I69354 Hemiplegia and hemiparesis following cerebral infarction affecting left non-dominant side: Secondary | ICD-10-CM | POA: Diagnosis not present

## 2020-02-22 DIAGNOSIS — I2510 Atherosclerotic heart disease of native coronary artery without angina pectoris: Secondary | ICD-10-CM | POA: Diagnosis not present

## 2020-02-22 DIAGNOSIS — E1142 Type 2 diabetes mellitus with diabetic polyneuropathy: Secondary | ICD-10-CM | POA: Diagnosis not present

## 2020-02-22 DIAGNOSIS — I10 Essential (primary) hypertension: Secondary | ICD-10-CM | POA: Diagnosis not present

## 2020-02-22 DIAGNOSIS — M25512 Pain in left shoulder: Secondary | ICD-10-CM | POA: Diagnosis not present

## 2020-02-22 DIAGNOSIS — E46 Unspecified protein-calorie malnutrition: Secondary | ICD-10-CM | POA: Diagnosis not present

## 2020-02-22 DIAGNOSIS — M1991 Primary osteoarthritis, unspecified site: Secondary | ICD-10-CM | POA: Diagnosis not present

## 2020-02-22 DIAGNOSIS — G5603 Carpal tunnel syndrome, bilateral upper limbs: Secondary | ICD-10-CM | POA: Diagnosis not present

## 2020-02-22 DIAGNOSIS — E785 Hyperlipidemia, unspecified: Secondary | ICD-10-CM | POA: Diagnosis not present

## 2020-02-24 DIAGNOSIS — I2510 Atherosclerotic heart disease of native coronary artery without angina pectoris: Secondary | ICD-10-CM | POA: Diagnosis not present

## 2020-02-24 DIAGNOSIS — G5603 Carpal tunnel syndrome, bilateral upper limbs: Secondary | ICD-10-CM | POA: Diagnosis not present

## 2020-02-24 DIAGNOSIS — E785 Hyperlipidemia, unspecified: Secondary | ICD-10-CM | POA: Diagnosis not present

## 2020-02-24 DIAGNOSIS — I10 Essential (primary) hypertension: Secondary | ICD-10-CM | POA: Diagnosis not present

## 2020-02-24 DIAGNOSIS — I69354 Hemiplegia and hemiparesis following cerebral infarction affecting left non-dominant side: Secondary | ICD-10-CM | POA: Diagnosis not present

## 2020-02-24 DIAGNOSIS — E1142 Type 2 diabetes mellitus with diabetic polyneuropathy: Secondary | ICD-10-CM | POA: Diagnosis not present

## 2020-02-24 DIAGNOSIS — M25512 Pain in left shoulder: Secondary | ICD-10-CM | POA: Diagnosis not present

## 2020-02-24 DIAGNOSIS — E46 Unspecified protein-calorie malnutrition: Secondary | ICD-10-CM | POA: Diagnosis not present

## 2020-02-24 DIAGNOSIS — M1991 Primary osteoarthritis, unspecified site: Secondary | ICD-10-CM | POA: Diagnosis not present

## 2020-02-29 DIAGNOSIS — I10 Essential (primary) hypertension: Secondary | ICD-10-CM | POA: Diagnosis not present

## 2020-02-29 DIAGNOSIS — M1991 Primary osteoarthritis, unspecified site: Secondary | ICD-10-CM | POA: Diagnosis not present

## 2020-02-29 DIAGNOSIS — I2510 Atherosclerotic heart disease of native coronary artery without angina pectoris: Secondary | ICD-10-CM | POA: Diagnosis not present

## 2020-02-29 DIAGNOSIS — E46 Unspecified protein-calorie malnutrition: Secondary | ICD-10-CM | POA: Diagnosis not present

## 2020-02-29 DIAGNOSIS — I69354 Hemiplegia and hemiparesis following cerebral infarction affecting left non-dominant side: Secondary | ICD-10-CM | POA: Diagnosis not present

## 2020-02-29 DIAGNOSIS — E1142 Type 2 diabetes mellitus with diabetic polyneuropathy: Secondary | ICD-10-CM | POA: Diagnosis not present

## 2020-02-29 DIAGNOSIS — E785 Hyperlipidemia, unspecified: Secondary | ICD-10-CM | POA: Diagnosis not present

## 2020-02-29 DIAGNOSIS — M25512 Pain in left shoulder: Secondary | ICD-10-CM | POA: Diagnosis not present

## 2020-02-29 DIAGNOSIS — G5603 Carpal tunnel syndrome, bilateral upper limbs: Secondary | ICD-10-CM | POA: Diagnosis not present

## 2020-03-01 DIAGNOSIS — M25512 Pain in left shoulder: Secondary | ICD-10-CM | POA: Diagnosis not present

## 2020-03-01 DIAGNOSIS — I10 Essential (primary) hypertension: Secondary | ICD-10-CM | POA: Diagnosis not present

## 2020-03-01 DIAGNOSIS — E46 Unspecified protein-calorie malnutrition: Secondary | ICD-10-CM | POA: Diagnosis not present

## 2020-03-01 DIAGNOSIS — M1991 Primary osteoarthritis, unspecified site: Secondary | ICD-10-CM | POA: Diagnosis not present

## 2020-03-01 DIAGNOSIS — E785 Hyperlipidemia, unspecified: Secondary | ICD-10-CM | POA: Diagnosis not present

## 2020-03-01 DIAGNOSIS — E1142 Type 2 diabetes mellitus with diabetic polyneuropathy: Secondary | ICD-10-CM | POA: Diagnosis not present

## 2020-03-01 DIAGNOSIS — I69354 Hemiplegia and hemiparesis following cerebral infarction affecting left non-dominant side: Secondary | ICD-10-CM | POA: Diagnosis not present

## 2020-03-01 DIAGNOSIS — G5603 Carpal tunnel syndrome, bilateral upper limbs: Secondary | ICD-10-CM | POA: Diagnosis not present

## 2020-03-01 DIAGNOSIS — I2510 Atherosclerotic heart disease of native coronary artery without angina pectoris: Secondary | ICD-10-CM | POA: Diagnosis not present

## 2020-03-05 DIAGNOSIS — M1991 Primary osteoarthritis, unspecified site: Secondary | ICD-10-CM | POA: Diagnosis not present

## 2020-03-05 DIAGNOSIS — M25512 Pain in left shoulder: Secondary | ICD-10-CM | POA: Diagnosis not present

## 2020-03-05 DIAGNOSIS — I2510 Atherosclerotic heart disease of native coronary artery without angina pectoris: Secondary | ICD-10-CM | POA: Diagnosis not present

## 2020-03-05 DIAGNOSIS — G5603 Carpal tunnel syndrome, bilateral upper limbs: Secondary | ICD-10-CM | POA: Diagnosis not present

## 2020-03-05 DIAGNOSIS — E1142 Type 2 diabetes mellitus with diabetic polyneuropathy: Secondary | ICD-10-CM | POA: Diagnosis not present

## 2020-03-05 DIAGNOSIS — I69354 Hemiplegia and hemiparesis following cerebral infarction affecting left non-dominant side: Secondary | ICD-10-CM | POA: Diagnosis not present

## 2020-03-05 DIAGNOSIS — I10 Essential (primary) hypertension: Secondary | ICD-10-CM | POA: Diagnosis not present

## 2020-03-05 DIAGNOSIS — E785 Hyperlipidemia, unspecified: Secondary | ICD-10-CM | POA: Diagnosis not present

## 2020-03-05 DIAGNOSIS — E46 Unspecified protein-calorie malnutrition: Secondary | ICD-10-CM | POA: Diagnosis not present

## 2020-03-06 DIAGNOSIS — G5603 Carpal tunnel syndrome, bilateral upper limbs: Secondary | ICD-10-CM | POA: Diagnosis not present

## 2020-03-06 DIAGNOSIS — M25512 Pain in left shoulder: Secondary | ICD-10-CM | POA: Diagnosis not present

## 2020-03-06 DIAGNOSIS — M1991 Primary osteoarthritis, unspecified site: Secondary | ICD-10-CM | POA: Diagnosis not present

## 2020-03-06 DIAGNOSIS — E785 Hyperlipidemia, unspecified: Secondary | ICD-10-CM | POA: Diagnosis not present

## 2020-03-06 DIAGNOSIS — I69354 Hemiplegia and hemiparesis following cerebral infarction affecting left non-dominant side: Secondary | ICD-10-CM | POA: Diagnosis not present

## 2020-03-06 DIAGNOSIS — I2510 Atherosclerotic heart disease of native coronary artery without angina pectoris: Secondary | ICD-10-CM | POA: Diagnosis not present

## 2020-03-06 DIAGNOSIS — E46 Unspecified protein-calorie malnutrition: Secondary | ICD-10-CM | POA: Diagnosis not present

## 2020-03-06 DIAGNOSIS — I10 Essential (primary) hypertension: Secondary | ICD-10-CM | POA: Diagnosis not present

## 2020-03-06 DIAGNOSIS — E1142 Type 2 diabetes mellitus with diabetic polyneuropathy: Secondary | ICD-10-CM | POA: Diagnosis not present

## 2020-03-08 DIAGNOSIS — E785 Hyperlipidemia, unspecified: Secondary | ICD-10-CM | POA: Diagnosis not present

## 2020-03-08 DIAGNOSIS — M25512 Pain in left shoulder: Secondary | ICD-10-CM | POA: Diagnosis not present

## 2020-03-08 DIAGNOSIS — M1991 Primary osteoarthritis, unspecified site: Secondary | ICD-10-CM | POA: Diagnosis not present

## 2020-03-08 DIAGNOSIS — E46 Unspecified protein-calorie malnutrition: Secondary | ICD-10-CM | POA: Diagnosis not present

## 2020-03-08 DIAGNOSIS — I69354 Hemiplegia and hemiparesis following cerebral infarction affecting left non-dominant side: Secondary | ICD-10-CM | POA: Diagnosis not present

## 2020-03-08 DIAGNOSIS — I10 Essential (primary) hypertension: Secondary | ICD-10-CM | POA: Diagnosis not present

## 2020-03-08 DIAGNOSIS — E1142 Type 2 diabetes mellitus with diabetic polyneuropathy: Secondary | ICD-10-CM | POA: Diagnosis not present

## 2020-03-08 DIAGNOSIS — I2510 Atherosclerotic heart disease of native coronary artery without angina pectoris: Secondary | ICD-10-CM | POA: Diagnosis not present

## 2020-03-08 DIAGNOSIS — G5603 Carpal tunnel syndrome, bilateral upper limbs: Secondary | ICD-10-CM | POA: Diagnosis not present

## 2020-03-10 DIAGNOSIS — I69354 Hemiplegia and hemiparesis following cerebral infarction affecting left non-dominant side: Secondary | ICD-10-CM | POA: Diagnosis not present

## 2020-03-10 DIAGNOSIS — E1142 Type 2 diabetes mellitus with diabetic polyneuropathy: Secondary | ICD-10-CM | POA: Diagnosis not present

## 2020-03-10 DIAGNOSIS — M1991 Primary osteoarthritis, unspecified site: Secondary | ICD-10-CM | POA: Diagnosis not present

## 2020-03-10 DIAGNOSIS — G5603 Carpal tunnel syndrome, bilateral upper limbs: Secondary | ICD-10-CM | POA: Diagnosis not present

## 2020-03-10 DIAGNOSIS — I2510 Atherosclerotic heart disease of native coronary artery without angina pectoris: Secondary | ICD-10-CM | POA: Diagnosis not present

## 2020-03-10 DIAGNOSIS — E46 Unspecified protein-calorie malnutrition: Secondary | ICD-10-CM | POA: Diagnosis not present

## 2020-03-10 DIAGNOSIS — I10 Essential (primary) hypertension: Secondary | ICD-10-CM | POA: Diagnosis not present

## 2020-03-10 DIAGNOSIS — M25512 Pain in left shoulder: Secondary | ICD-10-CM | POA: Diagnosis not present

## 2020-03-10 DIAGNOSIS — E785 Hyperlipidemia, unspecified: Secondary | ICD-10-CM | POA: Diagnosis not present

## 2020-03-12 DIAGNOSIS — E1142 Type 2 diabetes mellitus with diabetic polyneuropathy: Secondary | ICD-10-CM | POA: Diagnosis not present

## 2020-03-12 DIAGNOSIS — E46 Unspecified protein-calorie malnutrition: Secondary | ICD-10-CM | POA: Diagnosis not present

## 2020-03-12 DIAGNOSIS — I10 Essential (primary) hypertension: Secondary | ICD-10-CM | POA: Diagnosis not present

## 2020-03-12 DIAGNOSIS — E785 Hyperlipidemia, unspecified: Secondary | ICD-10-CM | POA: Diagnosis not present

## 2020-03-12 DIAGNOSIS — G5603 Carpal tunnel syndrome, bilateral upper limbs: Secondary | ICD-10-CM | POA: Diagnosis not present

## 2020-03-12 DIAGNOSIS — M1991 Primary osteoarthritis, unspecified site: Secondary | ICD-10-CM | POA: Diagnosis not present

## 2020-03-12 DIAGNOSIS — I69354 Hemiplegia and hemiparesis following cerebral infarction affecting left non-dominant side: Secondary | ICD-10-CM | POA: Diagnosis not present

## 2020-03-12 DIAGNOSIS — I2510 Atherosclerotic heart disease of native coronary artery without angina pectoris: Secondary | ICD-10-CM | POA: Diagnosis not present

## 2020-03-12 DIAGNOSIS — M25512 Pain in left shoulder: Secondary | ICD-10-CM | POA: Diagnosis not present

## 2020-03-13 DIAGNOSIS — G5603 Carpal tunnel syndrome, bilateral upper limbs: Secondary | ICD-10-CM | POA: Diagnosis not present

## 2020-03-13 DIAGNOSIS — E785 Hyperlipidemia, unspecified: Secondary | ICD-10-CM | POA: Diagnosis not present

## 2020-03-13 DIAGNOSIS — M1991 Primary osteoarthritis, unspecified site: Secondary | ICD-10-CM | POA: Diagnosis not present

## 2020-03-13 DIAGNOSIS — E1142 Type 2 diabetes mellitus with diabetic polyneuropathy: Secondary | ICD-10-CM | POA: Diagnosis not present

## 2020-03-13 DIAGNOSIS — M25512 Pain in left shoulder: Secondary | ICD-10-CM | POA: Diagnosis not present

## 2020-03-13 DIAGNOSIS — I2510 Atherosclerotic heart disease of native coronary artery without angina pectoris: Secondary | ICD-10-CM | POA: Diagnosis not present

## 2020-03-13 DIAGNOSIS — I69354 Hemiplegia and hemiparesis following cerebral infarction affecting left non-dominant side: Secondary | ICD-10-CM | POA: Diagnosis not present

## 2020-03-13 DIAGNOSIS — E46 Unspecified protein-calorie malnutrition: Secondary | ICD-10-CM | POA: Diagnosis not present

## 2020-03-13 DIAGNOSIS — I10 Essential (primary) hypertension: Secondary | ICD-10-CM | POA: Diagnosis not present

## 2020-03-18 DIAGNOSIS — E1142 Type 2 diabetes mellitus with diabetic polyneuropathy: Secondary | ICD-10-CM | POA: Diagnosis not present

## 2020-03-18 DIAGNOSIS — I69351 Hemiplegia and hemiparesis following cerebral infarction affecting right dominant side: Secondary | ICD-10-CM | POA: Diagnosis not present

## 2020-03-18 DIAGNOSIS — I10 Essential (primary) hypertension: Secondary | ICD-10-CM | POA: Diagnosis not present

## 2020-03-18 DIAGNOSIS — M1990 Unspecified osteoarthritis, unspecified site: Secondary | ICD-10-CM | POA: Diagnosis not present

## 2020-03-18 DIAGNOSIS — Z7984 Long term (current) use of oral hypoglycemic drugs: Secondary | ICD-10-CM | POA: Diagnosis not present

## 2020-03-18 DIAGNOSIS — E785 Hyperlipidemia, unspecified: Secondary | ICD-10-CM | POA: Diagnosis not present

## 2020-03-18 DIAGNOSIS — I2510 Atherosclerotic heart disease of native coronary artery without angina pectoris: Secondary | ICD-10-CM | POA: Diagnosis not present

## 2020-03-19 DIAGNOSIS — G5603 Carpal tunnel syndrome, bilateral upper limbs: Secondary | ICD-10-CM | POA: Diagnosis not present

## 2020-03-19 DIAGNOSIS — M25512 Pain in left shoulder: Secondary | ICD-10-CM | POA: Diagnosis not present

## 2020-03-19 DIAGNOSIS — I69354 Hemiplegia and hemiparesis following cerebral infarction affecting left non-dominant side: Secondary | ICD-10-CM | POA: Diagnosis not present

## 2020-03-19 DIAGNOSIS — I2510 Atherosclerotic heart disease of native coronary artery without angina pectoris: Secondary | ICD-10-CM | POA: Diagnosis not present

## 2020-03-19 DIAGNOSIS — E785 Hyperlipidemia, unspecified: Secondary | ICD-10-CM | POA: Diagnosis not present

## 2020-03-19 DIAGNOSIS — M1991 Primary osteoarthritis, unspecified site: Secondary | ICD-10-CM | POA: Diagnosis not present

## 2020-03-19 DIAGNOSIS — E46 Unspecified protein-calorie malnutrition: Secondary | ICD-10-CM | POA: Diagnosis not present

## 2020-03-19 DIAGNOSIS — E1142 Type 2 diabetes mellitus with diabetic polyneuropathy: Secondary | ICD-10-CM | POA: Diagnosis not present

## 2020-03-19 DIAGNOSIS — I10 Essential (primary) hypertension: Secondary | ICD-10-CM | POA: Diagnosis not present

## 2020-03-21 DIAGNOSIS — E46 Unspecified protein-calorie malnutrition: Secondary | ICD-10-CM | POA: Diagnosis not present

## 2020-03-21 DIAGNOSIS — M1991 Primary osteoarthritis, unspecified site: Secondary | ICD-10-CM | POA: Diagnosis not present

## 2020-03-21 DIAGNOSIS — E1142 Type 2 diabetes mellitus with diabetic polyneuropathy: Secondary | ICD-10-CM | POA: Diagnosis not present

## 2020-03-21 DIAGNOSIS — I2510 Atherosclerotic heart disease of native coronary artery without angina pectoris: Secondary | ICD-10-CM | POA: Diagnosis not present

## 2020-03-21 DIAGNOSIS — M25512 Pain in left shoulder: Secondary | ICD-10-CM | POA: Diagnosis not present

## 2020-03-21 DIAGNOSIS — I69354 Hemiplegia and hemiparesis following cerebral infarction affecting left non-dominant side: Secondary | ICD-10-CM | POA: Diagnosis not present

## 2020-03-21 DIAGNOSIS — I10 Essential (primary) hypertension: Secondary | ICD-10-CM | POA: Diagnosis not present

## 2020-03-21 DIAGNOSIS — E785 Hyperlipidemia, unspecified: Secondary | ICD-10-CM | POA: Diagnosis not present

## 2020-03-21 DIAGNOSIS — G5603 Carpal tunnel syndrome, bilateral upper limbs: Secondary | ICD-10-CM | POA: Diagnosis not present

## 2020-03-23 DIAGNOSIS — I10 Essential (primary) hypertension: Secondary | ICD-10-CM | POA: Diagnosis not present

## 2020-03-23 DIAGNOSIS — G5603 Carpal tunnel syndrome, bilateral upper limbs: Secondary | ICD-10-CM | POA: Diagnosis not present

## 2020-03-23 DIAGNOSIS — I69354 Hemiplegia and hemiparesis following cerebral infarction affecting left non-dominant side: Secondary | ICD-10-CM | POA: Diagnosis not present

## 2020-03-23 DIAGNOSIS — M1991 Primary osteoarthritis, unspecified site: Secondary | ICD-10-CM | POA: Diagnosis not present

## 2020-03-23 DIAGNOSIS — E785 Hyperlipidemia, unspecified: Secondary | ICD-10-CM | POA: Diagnosis not present

## 2020-03-23 DIAGNOSIS — E1142 Type 2 diabetes mellitus with diabetic polyneuropathy: Secondary | ICD-10-CM | POA: Diagnosis not present

## 2020-03-23 DIAGNOSIS — E46 Unspecified protein-calorie malnutrition: Secondary | ICD-10-CM | POA: Diagnosis not present

## 2020-03-23 DIAGNOSIS — M25512 Pain in left shoulder: Secondary | ICD-10-CM | POA: Diagnosis not present

## 2020-03-23 DIAGNOSIS — I2510 Atherosclerotic heart disease of native coronary artery without angina pectoris: Secondary | ICD-10-CM | POA: Diagnosis not present

## 2020-03-26 DIAGNOSIS — E785 Hyperlipidemia, unspecified: Secondary | ICD-10-CM | POA: Diagnosis not present

## 2020-03-26 DIAGNOSIS — G5603 Carpal tunnel syndrome, bilateral upper limbs: Secondary | ICD-10-CM | POA: Diagnosis not present

## 2020-03-26 DIAGNOSIS — M25512 Pain in left shoulder: Secondary | ICD-10-CM | POA: Diagnosis not present

## 2020-03-26 DIAGNOSIS — E1142 Type 2 diabetes mellitus with diabetic polyneuropathy: Secondary | ICD-10-CM | POA: Diagnosis not present

## 2020-03-26 DIAGNOSIS — M1991 Primary osteoarthritis, unspecified site: Secondary | ICD-10-CM | POA: Diagnosis not present

## 2020-03-26 DIAGNOSIS — I69354 Hemiplegia and hemiparesis following cerebral infarction affecting left non-dominant side: Secondary | ICD-10-CM | POA: Diagnosis not present

## 2020-03-26 DIAGNOSIS — I2510 Atherosclerotic heart disease of native coronary artery without angina pectoris: Secondary | ICD-10-CM | POA: Diagnosis not present

## 2020-03-26 DIAGNOSIS — E46 Unspecified protein-calorie malnutrition: Secondary | ICD-10-CM | POA: Diagnosis not present

## 2020-03-26 DIAGNOSIS — I10 Essential (primary) hypertension: Secondary | ICD-10-CM | POA: Diagnosis not present

## 2020-03-27 DIAGNOSIS — R809 Proteinuria, unspecified: Secondary | ICD-10-CM | POA: Diagnosis not present

## 2020-03-27 DIAGNOSIS — E1129 Type 2 diabetes mellitus with other diabetic kidney complication: Secondary | ICD-10-CM | POA: Diagnosis not present

## 2020-03-29 DIAGNOSIS — R809 Proteinuria, unspecified: Secondary | ICD-10-CM | POA: Diagnosis not present

## 2020-03-29 DIAGNOSIS — E1129 Type 2 diabetes mellitus with other diabetic kidney complication: Secondary | ICD-10-CM | POA: Diagnosis not present

## 2020-03-29 DIAGNOSIS — R8290 Unspecified abnormal findings in urine: Secondary | ICD-10-CM | POA: Diagnosis not present

## 2020-04-04 DIAGNOSIS — E1142 Type 2 diabetes mellitus with diabetic polyneuropathy: Secondary | ICD-10-CM | POA: Diagnosis not present

## 2020-04-04 DIAGNOSIS — G5603 Carpal tunnel syndrome, bilateral upper limbs: Secondary | ICD-10-CM | POA: Diagnosis not present

## 2020-04-04 DIAGNOSIS — I2510 Atherosclerotic heart disease of native coronary artery without angina pectoris: Secondary | ICD-10-CM | POA: Diagnosis not present

## 2020-04-04 DIAGNOSIS — I69354 Hemiplegia and hemiparesis following cerebral infarction affecting left non-dominant side: Secondary | ICD-10-CM | POA: Diagnosis not present

## 2020-04-04 DIAGNOSIS — M25512 Pain in left shoulder: Secondary | ICD-10-CM | POA: Diagnosis not present

## 2020-04-04 DIAGNOSIS — E785 Hyperlipidemia, unspecified: Secondary | ICD-10-CM | POA: Diagnosis not present

## 2020-04-04 DIAGNOSIS — E46 Unspecified protein-calorie malnutrition: Secondary | ICD-10-CM | POA: Diagnosis not present

## 2020-04-04 DIAGNOSIS — M1991 Primary osteoarthritis, unspecified site: Secondary | ICD-10-CM | POA: Diagnosis not present

## 2020-04-04 DIAGNOSIS — I10 Essential (primary) hypertension: Secondary | ICD-10-CM | POA: Diagnosis not present

## 2020-04-05 DIAGNOSIS — E785 Hyperlipidemia, unspecified: Secondary | ICD-10-CM | POA: Diagnosis not present

## 2020-04-05 DIAGNOSIS — I10 Essential (primary) hypertension: Secondary | ICD-10-CM | POA: Diagnosis not present

## 2020-04-05 DIAGNOSIS — M1991 Primary osteoarthritis, unspecified site: Secondary | ICD-10-CM | POA: Diagnosis not present

## 2020-04-05 DIAGNOSIS — E1142 Type 2 diabetes mellitus with diabetic polyneuropathy: Secondary | ICD-10-CM | POA: Diagnosis not present

## 2020-04-05 DIAGNOSIS — E46 Unspecified protein-calorie malnutrition: Secondary | ICD-10-CM | POA: Diagnosis not present

## 2020-04-05 DIAGNOSIS — M25512 Pain in left shoulder: Secondary | ICD-10-CM | POA: Diagnosis not present

## 2020-04-05 DIAGNOSIS — G5603 Carpal tunnel syndrome, bilateral upper limbs: Secondary | ICD-10-CM | POA: Diagnosis not present

## 2020-04-05 DIAGNOSIS — I2510 Atherosclerotic heart disease of native coronary artery without angina pectoris: Secondary | ICD-10-CM | POA: Diagnosis not present

## 2020-04-05 DIAGNOSIS — I69354 Hemiplegia and hemiparesis following cerebral infarction affecting left non-dominant side: Secondary | ICD-10-CM | POA: Diagnosis not present

## 2020-04-06 DIAGNOSIS — I69354 Hemiplegia and hemiparesis following cerebral infarction affecting left non-dominant side: Secondary | ICD-10-CM | POA: Diagnosis not present

## 2020-04-06 DIAGNOSIS — E785 Hyperlipidemia, unspecified: Secondary | ICD-10-CM | POA: Diagnosis not present

## 2020-04-06 DIAGNOSIS — M1991 Primary osteoarthritis, unspecified site: Secondary | ICD-10-CM | POA: Diagnosis not present

## 2020-04-06 DIAGNOSIS — M25512 Pain in left shoulder: Secondary | ICD-10-CM | POA: Diagnosis not present

## 2020-04-06 DIAGNOSIS — E46 Unspecified protein-calorie malnutrition: Secondary | ICD-10-CM | POA: Diagnosis not present

## 2020-04-06 DIAGNOSIS — G5603 Carpal tunnel syndrome, bilateral upper limbs: Secondary | ICD-10-CM | POA: Diagnosis not present

## 2020-04-06 DIAGNOSIS — E1142 Type 2 diabetes mellitus with diabetic polyneuropathy: Secondary | ICD-10-CM | POA: Diagnosis not present

## 2020-04-06 DIAGNOSIS — I10 Essential (primary) hypertension: Secondary | ICD-10-CM | POA: Diagnosis not present

## 2020-04-06 DIAGNOSIS — I2510 Atherosclerotic heart disease of native coronary artery without angina pectoris: Secondary | ICD-10-CM | POA: Diagnosis not present

## 2020-04-09 DIAGNOSIS — I10 Essential (primary) hypertension: Secondary | ICD-10-CM | POA: Diagnosis not present

## 2020-04-09 DIAGNOSIS — G5603 Carpal tunnel syndrome, bilateral upper limbs: Secondary | ICD-10-CM | POA: Diagnosis not present

## 2020-04-09 DIAGNOSIS — E1142 Type 2 diabetes mellitus with diabetic polyneuropathy: Secondary | ICD-10-CM | POA: Diagnosis not present

## 2020-04-09 DIAGNOSIS — E46 Unspecified protein-calorie malnutrition: Secondary | ICD-10-CM | POA: Diagnosis not present

## 2020-04-09 DIAGNOSIS — I2510 Atherosclerotic heart disease of native coronary artery without angina pectoris: Secondary | ICD-10-CM | POA: Diagnosis not present

## 2020-04-09 DIAGNOSIS — M25512 Pain in left shoulder: Secondary | ICD-10-CM | POA: Diagnosis not present

## 2020-04-09 DIAGNOSIS — I69354 Hemiplegia and hemiparesis following cerebral infarction affecting left non-dominant side: Secondary | ICD-10-CM | POA: Diagnosis not present

## 2020-04-09 DIAGNOSIS — E785 Hyperlipidemia, unspecified: Secondary | ICD-10-CM | POA: Diagnosis not present

## 2020-04-09 DIAGNOSIS — M1991 Primary osteoarthritis, unspecified site: Secondary | ICD-10-CM | POA: Diagnosis not present

## 2020-04-12 DIAGNOSIS — I10 Essential (primary) hypertension: Secondary | ICD-10-CM | POA: Diagnosis not present

## 2020-04-12 DIAGNOSIS — I2510 Atherosclerotic heart disease of native coronary artery without angina pectoris: Secondary | ICD-10-CM | POA: Diagnosis not present

## 2020-04-12 DIAGNOSIS — M1991 Primary osteoarthritis, unspecified site: Secondary | ICD-10-CM | POA: Diagnosis not present

## 2020-04-12 DIAGNOSIS — E46 Unspecified protein-calorie malnutrition: Secondary | ICD-10-CM | POA: Diagnosis not present

## 2020-04-12 DIAGNOSIS — I69354 Hemiplegia and hemiparesis following cerebral infarction affecting left non-dominant side: Secondary | ICD-10-CM | POA: Diagnosis not present

## 2020-04-12 DIAGNOSIS — M25512 Pain in left shoulder: Secondary | ICD-10-CM | POA: Diagnosis not present

## 2020-04-12 DIAGNOSIS — E785 Hyperlipidemia, unspecified: Secondary | ICD-10-CM | POA: Diagnosis not present

## 2020-04-12 DIAGNOSIS — G5603 Carpal tunnel syndrome, bilateral upper limbs: Secondary | ICD-10-CM | POA: Diagnosis not present

## 2020-04-12 DIAGNOSIS — E1142 Type 2 diabetes mellitus with diabetic polyneuropathy: Secondary | ICD-10-CM | POA: Diagnosis not present

## 2020-04-13 DIAGNOSIS — G5603 Carpal tunnel syndrome, bilateral upper limbs: Secondary | ICD-10-CM | POA: Diagnosis not present

## 2020-04-13 DIAGNOSIS — E1142 Type 2 diabetes mellitus with diabetic polyneuropathy: Secondary | ICD-10-CM | POA: Diagnosis not present

## 2020-04-13 DIAGNOSIS — E46 Unspecified protein-calorie malnutrition: Secondary | ICD-10-CM | POA: Diagnosis not present

## 2020-04-13 DIAGNOSIS — I2510 Atherosclerotic heart disease of native coronary artery without angina pectoris: Secondary | ICD-10-CM | POA: Diagnosis not present

## 2020-04-13 DIAGNOSIS — M1991 Primary osteoarthritis, unspecified site: Secondary | ICD-10-CM | POA: Diagnosis not present

## 2020-04-13 DIAGNOSIS — M25512 Pain in left shoulder: Secondary | ICD-10-CM | POA: Diagnosis not present

## 2020-04-13 DIAGNOSIS — I69354 Hemiplegia and hemiparesis following cerebral infarction affecting left non-dominant side: Secondary | ICD-10-CM | POA: Diagnosis not present

## 2020-04-13 DIAGNOSIS — I10 Essential (primary) hypertension: Secondary | ICD-10-CM | POA: Diagnosis not present

## 2020-04-13 DIAGNOSIS — E785 Hyperlipidemia, unspecified: Secondary | ICD-10-CM | POA: Diagnosis not present

## 2020-04-15 DIAGNOSIS — E785 Hyperlipidemia, unspecified: Secondary | ICD-10-CM | POA: Diagnosis not present

## 2020-04-15 DIAGNOSIS — Z7984 Long term (current) use of oral hypoglycemic drugs: Secondary | ICD-10-CM | POA: Diagnosis not present

## 2020-04-15 DIAGNOSIS — E1142 Type 2 diabetes mellitus with diabetic polyneuropathy: Secondary | ICD-10-CM | POA: Diagnosis not present

## 2020-04-15 DIAGNOSIS — I10 Essential (primary) hypertension: Secondary | ICD-10-CM | POA: Diagnosis not present

## 2020-04-15 DIAGNOSIS — M1990 Unspecified osteoarthritis, unspecified site: Secondary | ICD-10-CM | POA: Diagnosis not present

## 2020-04-15 DIAGNOSIS — I69351 Hemiplegia and hemiparesis following cerebral infarction affecting right dominant side: Secondary | ICD-10-CM | POA: Diagnosis not present

## 2020-04-15 DIAGNOSIS — I2510 Atherosclerotic heart disease of native coronary artery without angina pectoris: Secondary | ICD-10-CM | POA: Diagnosis not present

## 2020-04-16 DIAGNOSIS — E1142 Type 2 diabetes mellitus with diabetic polyneuropathy: Secondary | ICD-10-CM | POA: Diagnosis not present

## 2020-04-16 DIAGNOSIS — E785 Hyperlipidemia, unspecified: Secondary | ICD-10-CM | POA: Diagnosis not present

## 2020-04-16 DIAGNOSIS — G5603 Carpal tunnel syndrome, bilateral upper limbs: Secondary | ICD-10-CM | POA: Diagnosis not present

## 2020-04-16 DIAGNOSIS — I2510 Atherosclerotic heart disease of native coronary artery without angina pectoris: Secondary | ICD-10-CM | POA: Diagnosis not present

## 2020-04-16 DIAGNOSIS — M1991 Primary osteoarthritis, unspecified site: Secondary | ICD-10-CM | POA: Diagnosis not present

## 2020-04-16 DIAGNOSIS — I69354 Hemiplegia and hemiparesis following cerebral infarction affecting left non-dominant side: Secondary | ICD-10-CM | POA: Diagnosis not present

## 2020-04-16 DIAGNOSIS — I10 Essential (primary) hypertension: Secondary | ICD-10-CM | POA: Diagnosis not present

## 2020-04-16 DIAGNOSIS — M25512 Pain in left shoulder: Secondary | ICD-10-CM | POA: Diagnosis not present

## 2020-04-16 DIAGNOSIS — E46 Unspecified protein-calorie malnutrition: Secondary | ICD-10-CM | POA: Diagnosis not present

## 2020-04-19 DIAGNOSIS — M25512 Pain in left shoulder: Secondary | ICD-10-CM | POA: Diagnosis not present

## 2020-04-19 DIAGNOSIS — M1991 Primary osteoarthritis, unspecified site: Secondary | ICD-10-CM | POA: Diagnosis not present

## 2020-04-19 DIAGNOSIS — E1142 Type 2 diabetes mellitus with diabetic polyneuropathy: Secondary | ICD-10-CM | POA: Diagnosis not present

## 2020-04-19 DIAGNOSIS — I2510 Atherosclerotic heart disease of native coronary artery without angina pectoris: Secondary | ICD-10-CM | POA: Diagnosis not present

## 2020-04-19 DIAGNOSIS — E785 Hyperlipidemia, unspecified: Secondary | ICD-10-CM | POA: Diagnosis not present

## 2020-04-19 DIAGNOSIS — E46 Unspecified protein-calorie malnutrition: Secondary | ICD-10-CM | POA: Diagnosis not present

## 2020-04-19 DIAGNOSIS — G5603 Carpal tunnel syndrome, bilateral upper limbs: Secondary | ICD-10-CM | POA: Diagnosis not present

## 2020-04-19 DIAGNOSIS — I69354 Hemiplegia and hemiparesis following cerebral infarction affecting left non-dominant side: Secondary | ICD-10-CM | POA: Diagnosis not present

## 2020-04-19 DIAGNOSIS — I10 Essential (primary) hypertension: Secondary | ICD-10-CM | POA: Diagnosis not present

## 2020-04-23 DIAGNOSIS — M25512 Pain in left shoulder: Secondary | ICD-10-CM | POA: Diagnosis not present

## 2020-04-23 DIAGNOSIS — G5603 Carpal tunnel syndrome, bilateral upper limbs: Secondary | ICD-10-CM | POA: Diagnosis not present

## 2020-04-23 DIAGNOSIS — E785 Hyperlipidemia, unspecified: Secondary | ICD-10-CM | POA: Diagnosis not present

## 2020-04-23 DIAGNOSIS — E46 Unspecified protein-calorie malnutrition: Secondary | ICD-10-CM | POA: Diagnosis not present

## 2020-04-23 DIAGNOSIS — E1142 Type 2 diabetes mellitus with diabetic polyneuropathy: Secondary | ICD-10-CM | POA: Diagnosis not present

## 2020-04-23 DIAGNOSIS — I10 Essential (primary) hypertension: Secondary | ICD-10-CM | POA: Diagnosis not present

## 2020-04-23 DIAGNOSIS — I69354 Hemiplegia and hemiparesis following cerebral infarction affecting left non-dominant side: Secondary | ICD-10-CM | POA: Diagnosis not present

## 2020-04-23 DIAGNOSIS — M1991 Primary osteoarthritis, unspecified site: Secondary | ICD-10-CM | POA: Diagnosis not present

## 2020-04-23 DIAGNOSIS — I2510 Atherosclerotic heart disease of native coronary artery without angina pectoris: Secondary | ICD-10-CM | POA: Diagnosis not present

## 2020-04-26 DIAGNOSIS — N390 Urinary tract infection, site not specified: Secondary | ICD-10-CM | POA: Diagnosis not present

## 2020-04-26 DIAGNOSIS — R809 Proteinuria, unspecified: Secondary | ICD-10-CM | POA: Diagnosis not present

## 2020-04-26 DIAGNOSIS — E1129 Type 2 diabetes mellitus with other diabetic kidney complication: Secondary | ICD-10-CM | POA: Diagnosis not present

## 2020-04-26 DIAGNOSIS — E1142 Type 2 diabetes mellitus with diabetic polyneuropathy: Secondary | ICD-10-CM | POA: Diagnosis not present

## 2020-04-26 DIAGNOSIS — I10 Essential (primary) hypertension: Secondary | ICD-10-CM | POA: Diagnosis not present

## 2020-04-26 DIAGNOSIS — Z0001 Encounter for general adult medical examination with abnormal findings: Secondary | ICD-10-CM | POA: Diagnosis not present

## 2020-04-26 DIAGNOSIS — I779 Disorder of arteries and arterioles, unspecified: Secondary | ICD-10-CM | POA: Diagnosis not present

## 2020-04-26 DIAGNOSIS — Z0000 Encounter for general adult medical examination without abnormal findings: Secondary | ICD-10-CM | POA: Diagnosis not present

## 2020-05-01 DIAGNOSIS — M1991 Primary osteoarthritis, unspecified site: Secondary | ICD-10-CM | POA: Diagnosis not present

## 2020-05-01 DIAGNOSIS — I10 Essential (primary) hypertension: Secondary | ICD-10-CM | POA: Diagnosis not present

## 2020-05-01 DIAGNOSIS — I69354 Hemiplegia and hemiparesis following cerebral infarction affecting left non-dominant side: Secondary | ICD-10-CM | POA: Diagnosis not present

## 2020-05-01 DIAGNOSIS — M25512 Pain in left shoulder: Secondary | ICD-10-CM | POA: Diagnosis not present

## 2020-05-01 DIAGNOSIS — E785 Hyperlipidemia, unspecified: Secondary | ICD-10-CM | POA: Diagnosis not present

## 2020-05-01 DIAGNOSIS — G5603 Carpal tunnel syndrome, bilateral upper limbs: Secondary | ICD-10-CM | POA: Diagnosis not present

## 2020-05-01 DIAGNOSIS — E46 Unspecified protein-calorie malnutrition: Secondary | ICD-10-CM | POA: Diagnosis not present

## 2020-05-01 DIAGNOSIS — E1142 Type 2 diabetes mellitus with diabetic polyneuropathy: Secondary | ICD-10-CM | POA: Diagnosis not present

## 2020-05-01 DIAGNOSIS — I2510 Atherosclerotic heart disease of native coronary artery without angina pectoris: Secondary | ICD-10-CM | POA: Diagnosis not present

## 2020-05-03 DIAGNOSIS — E1142 Type 2 diabetes mellitus with diabetic polyneuropathy: Secondary | ICD-10-CM | POA: Diagnosis not present

## 2020-05-03 DIAGNOSIS — I2510 Atherosclerotic heart disease of native coronary artery without angina pectoris: Secondary | ICD-10-CM | POA: Diagnosis not present

## 2020-05-03 DIAGNOSIS — M25512 Pain in left shoulder: Secondary | ICD-10-CM | POA: Diagnosis not present

## 2020-05-03 DIAGNOSIS — G5603 Carpal tunnel syndrome, bilateral upper limbs: Secondary | ICD-10-CM | POA: Diagnosis not present

## 2020-05-03 DIAGNOSIS — E46 Unspecified protein-calorie malnutrition: Secondary | ICD-10-CM | POA: Diagnosis not present

## 2020-05-03 DIAGNOSIS — I10 Essential (primary) hypertension: Secondary | ICD-10-CM | POA: Diagnosis not present

## 2020-05-03 DIAGNOSIS — M1991 Primary osteoarthritis, unspecified site: Secondary | ICD-10-CM | POA: Diagnosis not present

## 2020-05-03 DIAGNOSIS — I69354 Hemiplegia and hemiparesis following cerebral infarction affecting left non-dominant side: Secondary | ICD-10-CM | POA: Diagnosis not present

## 2020-05-03 DIAGNOSIS — E785 Hyperlipidemia, unspecified: Secondary | ICD-10-CM | POA: Diagnosis not present

## 2020-05-09 DIAGNOSIS — M1991 Primary osteoarthritis, unspecified site: Secondary | ICD-10-CM | POA: Diagnosis not present

## 2020-05-09 DIAGNOSIS — E46 Unspecified protein-calorie malnutrition: Secondary | ICD-10-CM | POA: Diagnosis not present

## 2020-05-09 DIAGNOSIS — E1142 Type 2 diabetes mellitus with diabetic polyneuropathy: Secondary | ICD-10-CM | POA: Diagnosis not present

## 2020-05-09 DIAGNOSIS — E785 Hyperlipidemia, unspecified: Secondary | ICD-10-CM | POA: Diagnosis not present

## 2020-05-09 DIAGNOSIS — I10 Essential (primary) hypertension: Secondary | ICD-10-CM | POA: Diagnosis not present

## 2020-05-09 DIAGNOSIS — I69354 Hemiplegia and hemiparesis following cerebral infarction affecting left non-dominant side: Secondary | ICD-10-CM | POA: Diagnosis not present

## 2020-05-09 DIAGNOSIS — M25512 Pain in left shoulder: Secondary | ICD-10-CM | POA: Diagnosis not present

## 2020-05-09 DIAGNOSIS — G5603 Carpal tunnel syndrome, bilateral upper limbs: Secondary | ICD-10-CM | POA: Diagnosis not present

## 2020-05-09 DIAGNOSIS — I2510 Atherosclerotic heart disease of native coronary artery without angina pectoris: Secondary | ICD-10-CM | POA: Diagnosis not present

## 2020-05-10 DIAGNOSIS — G5603 Carpal tunnel syndrome, bilateral upper limbs: Secondary | ICD-10-CM | POA: Diagnosis not present

## 2020-05-10 DIAGNOSIS — I2510 Atherosclerotic heart disease of native coronary artery without angina pectoris: Secondary | ICD-10-CM | POA: Diagnosis not present

## 2020-05-10 DIAGNOSIS — M25512 Pain in left shoulder: Secondary | ICD-10-CM | POA: Diagnosis not present

## 2020-05-10 DIAGNOSIS — M1991 Primary osteoarthritis, unspecified site: Secondary | ICD-10-CM | POA: Diagnosis not present

## 2020-05-10 DIAGNOSIS — I10 Essential (primary) hypertension: Secondary | ICD-10-CM | POA: Diagnosis not present

## 2020-05-10 DIAGNOSIS — E1142 Type 2 diabetes mellitus with diabetic polyneuropathy: Secondary | ICD-10-CM | POA: Diagnosis not present

## 2020-05-10 DIAGNOSIS — E46 Unspecified protein-calorie malnutrition: Secondary | ICD-10-CM | POA: Diagnosis not present

## 2020-05-10 DIAGNOSIS — I69354 Hemiplegia and hemiparesis following cerebral infarction affecting left non-dominant side: Secondary | ICD-10-CM | POA: Diagnosis not present

## 2020-05-10 DIAGNOSIS — E785 Hyperlipidemia, unspecified: Secondary | ICD-10-CM | POA: Diagnosis not present

## 2020-05-14 ENCOUNTER — Encounter (INDEPENDENT_AMBULATORY_CARE_PROVIDER_SITE_OTHER): Payer: Medicare HMO

## 2020-05-14 ENCOUNTER — Ambulatory Visit (INDEPENDENT_AMBULATORY_CARE_PROVIDER_SITE_OTHER): Payer: Medicare HMO | Admitting: Vascular Surgery

## 2020-05-14 DIAGNOSIS — M1991 Primary osteoarthritis, unspecified site: Secondary | ICD-10-CM | POA: Diagnosis not present

## 2020-05-14 DIAGNOSIS — I2510 Atherosclerotic heart disease of native coronary artery without angina pectoris: Secondary | ICD-10-CM | POA: Diagnosis not present

## 2020-05-14 DIAGNOSIS — I69354 Hemiplegia and hemiparesis following cerebral infarction affecting left non-dominant side: Secondary | ICD-10-CM | POA: Diagnosis not present

## 2020-05-14 DIAGNOSIS — E785 Hyperlipidemia, unspecified: Secondary | ICD-10-CM | POA: Diagnosis not present

## 2020-05-14 DIAGNOSIS — G5603 Carpal tunnel syndrome, bilateral upper limbs: Secondary | ICD-10-CM | POA: Diagnosis not present

## 2020-05-14 DIAGNOSIS — E1142 Type 2 diabetes mellitus with diabetic polyneuropathy: Secondary | ICD-10-CM | POA: Diagnosis not present

## 2020-05-14 DIAGNOSIS — M25512 Pain in left shoulder: Secondary | ICD-10-CM | POA: Diagnosis not present

## 2020-05-14 DIAGNOSIS — E46 Unspecified protein-calorie malnutrition: Secondary | ICD-10-CM | POA: Diagnosis not present

## 2020-05-14 DIAGNOSIS — I10 Essential (primary) hypertension: Secondary | ICD-10-CM | POA: Diagnosis not present

## 2020-05-16 DIAGNOSIS — E785 Hyperlipidemia, unspecified: Secondary | ICD-10-CM | POA: Diagnosis not present

## 2020-05-16 DIAGNOSIS — M1990 Unspecified osteoarthritis, unspecified site: Secondary | ICD-10-CM | POA: Diagnosis not present

## 2020-05-16 DIAGNOSIS — I10 Essential (primary) hypertension: Secondary | ICD-10-CM | POA: Diagnosis not present

## 2020-05-16 DIAGNOSIS — E1142 Type 2 diabetes mellitus with diabetic polyneuropathy: Secondary | ICD-10-CM | POA: Diagnosis not present

## 2020-05-16 DIAGNOSIS — I69351 Hemiplegia and hemiparesis following cerebral infarction affecting right dominant side: Secondary | ICD-10-CM | POA: Diagnosis not present

## 2020-05-16 DIAGNOSIS — Z7984 Long term (current) use of oral hypoglycemic drugs: Secondary | ICD-10-CM | POA: Diagnosis not present

## 2020-05-16 DIAGNOSIS — I2510 Atherosclerotic heart disease of native coronary artery without angina pectoris: Secondary | ICD-10-CM | POA: Diagnosis not present

## 2020-05-21 DIAGNOSIS — G5603 Carpal tunnel syndrome, bilateral upper limbs: Secondary | ICD-10-CM | POA: Diagnosis not present

## 2020-05-21 DIAGNOSIS — I10 Essential (primary) hypertension: Secondary | ICD-10-CM | POA: Diagnosis not present

## 2020-05-21 DIAGNOSIS — M1991 Primary osteoarthritis, unspecified site: Secondary | ICD-10-CM | POA: Diagnosis not present

## 2020-05-21 DIAGNOSIS — M25512 Pain in left shoulder: Secondary | ICD-10-CM | POA: Diagnosis not present

## 2020-05-21 DIAGNOSIS — I69354 Hemiplegia and hemiparesis following cerebral infarction affecting left non-dominant side: Secondary | ICD-10-CM | POA: Diagnosis not present

## 2020-05-21 DIAGNOSIS — E46 Unspecified protein-calorie malnutrition: Secondary | ICD-10-CM | POA: Diagnosis not present

## 2020-05-21 DIAGNOSIS — E1142 Type 2 diabetes mellitus with diabetic polyneuropathy: Secondary | ICD-10-CM | POA: Diagnosis not present

## 2020-05-21 DIAGNOSIS — I2510 Atherosclerotic heart disease of native coronary artery without angina pectoris: Secondary | ICD-10-CM | POA: Diagnosis not present

## 2020-05-21 DIAGNOSIS — E785 Hyperlipidemia, unspecified: Secondary | ICD-10-CM | POA: Diagnosis not present

## 2020-05-30 DIAGNOSIS — I69354 Hemiplegia and hemiparesis following cerebral infarction affecting left non-dominant side: Secondary | ICD-10-CM | POA: Diagnosis not present

## 2020-05-30 DIAGNOSIS — E46 Unspecified protein-calorie malnutrition: Secondary | ICD-10-CM | POA: Diagnosis not present

## 2020-05-30 DIAGNOSIS — M1991 Primary osteoarthritis, unspecified site: Secondary | ICD-10-CM | POA: Diagnosis not present

## 2020-05-30 DIAGNOSIS — E1142 Type 2 diabetes mellitus with diabetic polyneuropathy: Secondary | ICD-10-CM | POA: Diagnosis not present

## 2020-05-30 DIAGNOSIS — I10 Essential (primary) hypertension: Secondary | ICD-10-CM | POA: Diagnosis not present

## 2020-05-30 DIAGNOSIS — M25512 Pain in left shoulder: Secondary | ICD-10-CM | POA: Diagnosis not present

## 2020-05-30 DIAGNOSIS — I2510 Atherosclerotic heart disease of native coronary artery without angina pectoris: Secondary | ICD-10-CM | POA: Diagnosis not present

## 2020-05-30 DIAGNOSIS — G5603 Carpal tunnel syndrome, bilateral upper limbs: Secondary | ICD-10-CM | POA: Diagnosis not present

## 2020-05-30 DIAGNOSIS — E785 Hyperlipidemia, unspecified: Secondary | ICD-10-CM | POA: Diagnosis not present

## 2020-06-04 DIAGNOSIS — M1991 Primary osteoarthritis, unspecified site: Secondary | ICD-10-CM | POA: Diagnosis not present

## 2020-06-04 DIAGNOSIS — G5603 Carpal tunnel syndrome, bilateral upper limbs: Secondary | ICD-10-CM | POA: Diagnosis not present

## 2020-06-04 DIAGNOSIS — E785 Hyperlipidemia, unspecified: Secondary | ICD-10-CM | POA: Diagnosis not present

## 2020-06-04 DIAGNOSIS — E1142 Type 2 diabetes mellitus with diabetic polyneuropathy: Secondary | ICD-10-CM | POA: Diagnosis not present

## 2020-06-04 DIAGNOSIS — I69354 Hemiplegia and hemiparesis following cerebral infarction affecting left non-dominant side: Secondary | ICD-10-CM | POA: Diagnosis not present

## 2020-06-04 DIAGNOSIS — M25512 Pain in left shoulder: Secondary | ICD-10-CM | POA: Diagnosis not present

## 2020-06-04 DIAGNOSIS — I2510 Atherosclerotic heart disease of native coronary artery without angina pectoris: Secondary | ICD-10-CM | POA: Diagnosis not present

## 2020-06-04 DIAGNOSIS — I10 Essential (primary) hypertension: Secondary | ICD-10-CM | POA: Diagnosis not present

## 2020-06-04 DIAGNOSIS — E46 Unspecified protein-calorie malnutrition: Secondary | ICD-10-CM | POA: Diagnosis not present

## 2020-06-08 DIAGNOSIS — M1991 Primary osteoarthritis, unspecified site: Secondary | ICD-10-CM | POA: Diagnosis not present

## 2020-06-08 DIAGNOSIS — E1142 Type 2 diabetes mellitus with diabetic polyneuropathy: Secondary | ICD-10-CM | POA: Diagnosis not present

## 2020-06-08 DIAGNOSIS — M25512 Pain in left shoulder: Secondary | ICD-10-CM | POA: Diagnosis not present

## 2020-06-08 DIAGNOSIS — I2510 Atherosclerotic heart disease of native coronary artery without angina pectoris: Secondary | ICD-10-CM | POA: Diagnosis not present

## 2020-06-08 DIAGNOSIS — I10 Essential (primary) hypertension: Secondary | ICD-10-CM | POA: Diagnosis not present

## 2020-06-08 DIAGNOSIS — E46 Unspecified protein-calorie malnutrition: Secondary | ICD-10-CM | POA: Diagnosis not present

## 2020-06-08 DIAGNOSIS — I69354 Hemiplegia and hemiparesis following cerebral infarction affecting left non-dominant side: Secondary | ICD-10-CM | POA: Diagnosis not present

## 2020-06-08 DIAGNOSIS — G5603 Carpal tunnel syndrome, bilateral upper limbs: Secondary | ICD-10-CM | POA: Diagnosis not present

## 2020-06-08 DIAGNOSIS — E785 Hyperlipidemia, unspecified: Secondary | ICD-10-CM | POA: Diagnosis not present

## 2020-06-11 DIAGNOSIS — M1991 Primary osteoarthritis, unspecified site: Secondary | ICD-10-CM | POA: Diagnosis not present

## 2020-06-11 DIAGNOSIS — I69354 Hemiplegia and hemiparesis following cerebral infarction affecting left non-dominant side: Secondary | ICD-10-CM | POA: Diagnosis not present

## 2020-06-11 DIAGNOSIS — E785 Hyperlipidemia, unspecified: Secondary | ICD-10-CM | POA: Diagnosis not present

## 2020-06-11 DIAGNOSIS — M25512 Pain in left shoulder: Secondary | ICD-10-CM | POA: Diagnosis not present

## 2020-06-11 DIAGNOSIS — E1142 Type 2 diabetes mellitus with diabetic polyneuropathy: Secondary | ICD-10-CM | POA: Diagnosis not present

## 2020-06-11 DIAGNOSIS — I2510 Atherosclerotic heart disease of native coronary artery without angina pectoris: Secondary | ICD-10-CM | POA: Diagnosis not present

## 2020-06-11 DIAGNOSIS — I10 Essential (primary) hypertension: Secondary | ICD-10-CM | POA: Diagnosis not present

## 2020-06-11 DIAGNOSIS — E46 Unspecified protein-calorie malnutrition: Secondary | ICD-10-CM | POA: Diagnosis not present

## 2020-06-11 DIAGNOSIS — G5603 Carpal tunnel syndrome, bilateral upper limbs: Secondary | ICD-10-CM | POA: Diagnosis not present

## 2020-06-15 DIAGNOSIS — E1142 Type 2 diabetes mellitus with diabetic polyneuropathy: Secondary | ICD-10-CM | POA: Diagnosis not present

## 2020-06-15 DIAGNOSIS — Z7984 Long term (current) use of oral hypoglycemic drugs: Secondary | ICD-10-CM | POA: Diagnosis not present

## 2020-06-15 DIAGNOSIS — I2510 Atherosclerotic heart disease of native coronary artery without angina pectoris: Secondary | ICD-10-CM | POA: Diagnosis not present

## 2020-06-15 DIAGNOSIS — E785 Hyperlipidemia, unspecified: Secondary | ICD-10-CM | POA: Diagnosis not present

## 2020-06-15 DIAGNOSIS — I69351 Hemiplegia and hemiparesis following cerebral infarction affecting right dominant side: Secondary | ICD-10-CM | POA: Diagnosis not present

## 2020-06-15 DIAGNOSIS — M1990 Unspecified osteoarthritis, unspecified site: Secondary | ICD-10-CM | POA: Diagnosis not present

## 2020-06-15 DIAGNOSIS — I10 Essential (primary) hypertension: Secondary | ICD-10-CM | POA: Diagnosis not present

## 2020-06-18 DIAGNOSIS — E1142 Type 2 diabetes mellitus with diabetic polyneuropathy: Secondary | ICD-10-CM | POA: Diagnosis not present

## 2020-06-18 DIAGNOSIS — M25512 Pain in left shoulder: Secondary | ICD-10-CM | POA: Diagnosis not present

## 2020-06-18 DIAGNOSIS — I69354 Hemiplegia and hemiparesis following cerebral infarction affecting left non-dominant side: Secondary | ICD-10-CM | POA: Diagnosis not present

## 2020-06-18 DIAGNOSIS — I10 Essential (primary) hypertension: Secondary | ICD-10-CM | POA: Diagnosis not present

## 2020-06-18 DIAGNOSIS — I2510 Atherosclerotic heart disease of native coronary artery without angina pectoris: Secondary | ICD-10-CM | POA: Diagnosis not present

## 2020-06-18 DIAGNOSIS — E785 Hyperlipidemia, unspecified: Secondary | ICD-10-CM | POA: Diagnosis not present

## 2020-06-18 DIAGNOSIS — M1991 Primary osteoarthritis, unspecified site: Secondary | ICD-10-CM | POA: Diagnosis not present

## 2020-06-18 DIAGNOSIS — G5603 Carpal tunnel syndrome, bilateral upper limbs: Secondary | ICD-10-CM | POA: Diagnosis not present

## 2020-06-18 DIAGNOSIS — E46 Unspecified protein-calorie malnutrition: Secondary | ICD-10-CM | POA: Diagnosis not present

## 2020-06-25 DIAGNOSIS — M1991 Primary osteoarthritis, unspecified site: Secondary | ICD-10-CM | POA: Diagnosis not present

## 2020-06-25 DIAGNOSIS — E1142 Type 2 diabetes mellitus with diabetic polyneuropathy: Secondary | ICD-10-CM | POA: Diagnosis not present

## 2020-06-25 DIAGNOSIS — I69354 Hemiplegia and hemiparesis following cerebral infarction affecting left non-dominant side: Secondary | ICD-10-CM | POA: Diagnosis not present

## 2020-06-25 DIAGNOSIS — E46 Unspecified protein-calorie malnutrition: Secondary | ICD-10-CM | POA: Diagnosis not present

## 2020-06-25 DIAGNOSIS — E785 Hyperlipidemia, unspecified: Secondary | ICD-10-CM | POA: Diagnosis not present

## 2020-06-25 DIAGNOSIS — I10 Essential (primary) hypertension: Secondary | ICD-10-CM | POA: Diagnosis not present

## 2020-06-25 DIAGNOSIS — M25512 Pain in left shoulder: Secondary | ICD-10-CM | POA: Diagnosis not present

## 2020-06-25 DIAGNOSIS — G5603 Carpal tunnel syndrome, bilateral upper limbs: Secondary | ICD-10-CM | POA: Diagnosis not present

## 2020-06-25 DIAGNOSIS — I2510 Atherosclerotic heart disease of native coronary artery without angina pectoris: Secondary | ICD-10-CM | POA: Diagnosis not present

## 2020-07-05 DIAGNOSIS — E46 Unspecified protein-calorie malnutrition: Secondary | ICD-10-CM | POA: Diagnosis not present

## 2020-07-05 DIAGNOSIS — M25512 Pain in left shoulder: Secondary | ICD-10-CM | POA: Diagnosis not present

## 2020-07-05 DIAGNOSIS — I2510 Atherosclerotic heart disease of native coronary artery without angina pectoris: Secondary | ICD-10-CM | POA: Diagnosis not present

## 2020-07-05 DIAGNOSIS — E785 Hyperlipidemia, unspecified: Secondary | ICD-10-CM | POA: Diagnosis not present

## 2020-07-05 DIAGNOSIS — I69354 Hemiplegia and hemiparesis following cerebral infarction affecting left non-dominant side: Secondary | ICD-10-CM | POA: Diagnosis not present

## 2020-07-05 DIAGNOSIS — E1142 Type 2 diabetes mellitus with diabetic polyneuropathy: Secondary | ICD-10-CM | POA: Diagnosis not present

## 2020-07-05 DIAGNOSIS — M1991 Primary osteoarthritis, unspecified site: Secondary | ICD-10-CM | POA: Diagnosis not present

## 2020-07-05 DIAGNOSIS — I10 Essential (primary) hypertension: Secondary | ICD-10-CM | POA: Diagnosis not present

## 2020-07-05 DIAGNOSIS — G5603 Carpal tunnel syndrome, bilateral upper limbs: Secondary | ICD-10-CM | POA: Diagnosis not present

## 2020-07-08 DIAGNOSIS — E1142 Type 2 diabetes mellitus with diabetic polyneuropathy: Secondary | ICD-10-CM | POA: Diagnosis not present

## 2020-07-08 DIAGNOSIS — E46 Unspecified protein-calorie malnutrition: Secondary | ICD-10-CM | POA: Diagnosis not present

## 2020-07-08 DIAGNOSIS — I2510 Atherosclerotic heart disease of native coronary artery without angina pectoris: Secondary | ICD-10-CM | POA: Diagnosis not present

## 2020-07-08 DIAGNOSIS — G5603 Carpal tunnel syndrome, bilateral upper limbs: Secondary | ICD-10-CM | POA: Diagnosis not present

## 2020-07-08 DIAGNOSIS — I10 Essential (primary) hypertension: Secondary | ICD-10-CM | POA: Diagnosis not present

## 2020-07-08 DIAGNOSIS — M1991 Primary osteoarthritis, unspecified site: Secondary | ICD-10-CM | POA: Diagnosis not present

## 2020-07-08 DIAGNOSIS — M25512 Pain in left shoulder: Secondary | ICD-10-CM | POA: Diagnosis not present

## 2020-07-08 DIAGNOSIS — I69354 Hemiplegia and hemiparesis following cerebral infarction affecting left non-dominant side: Secondary | ICD-10-CM | POA: Diagnosis not present

## 2020-07-08 DIAGNOSIS — E785 Hyperlipidemia, unspecified: Secondary | ICD-10-CM | POA: Diagnosis not present

## 2020-07-12 DIAGNOSIS — I2510 Atherosclerotic heart disease of native coronary artery without angina pectoris: Secondary | ICD-10-CM | POA: Diagnosis not present

## 2020-07-12 DIAGNOSIS — E1142 Type 2 diabetes mellitus with diabetic polyneuropathy: Secondary | ICD-10-CM | POA: Diagnosis not present

## 2020-07-12 DIAGNOSIS — I69354 Hemiplegia and hemiparesis following cerebral infarction affecting left non-dominant side: Secondary | ICD-10-CM | POA: Diagnosis not present

## 2020-07-12 DIAGNOSIS — E785 Hyperlipidemia, unspecified: Secondary | ICD-10-CM | POA: Diagnosis not present

## 2020-07-12 DIAGNOSIS — M1991 Primary osteoarthritis, unspecified site: Secondary | ICD-10-CM | POA: Diagnosis not present

## 2020-07-12 DIAGNOSIS — I10 Essential (primary) hypertension: Secondary | ICD-10-CM | POA: Diagnosis not present

## 2020-07-12 DIAGNOSIS — G5603 Carpal tunnel syndrome, bilateral upper limbs: Secondary | ICD-10-CM | POA: Diagnosis not present

## 2020-07-12 DIAGNOSIS — E46 Unspecified protein-calorie malnutrition: Secondary | ICD-10-CM | POA: Diagnosis not present

## 2020-07-12 DIAGNOSIS — M25512 Pain in left shoulder: Secondary | ICD-10-CM | POA: Diagnosis not present

## 2020-07-16 DIAGNOSIS — E785 Hyperlipidemia, unspecified: Secondary | ICD-10-CM | POA: Diagnosis not present

## 2020-07-16 DIAGNOSIS — Z7984 Long term (current) use of oral hypoglycemic drugs: Secondary | ICD-10-CM | POA: Diagnosis not present

## 2020-07-16 DIAGNOSIS — I10 Essential (primary) hypertension: Secondary | ICD-10-CM | POA: Diagnosis not present

## 2020-07-16 DIAGNOSIS — E1142 Type 2 diabetes mellitus with diabetic polyneuropathy: Secondary | ICD-10-CM | POA: Diagnosis not present

## 2020-07-16 DIAGNOSIS — I2510 Atherosclerotic heart disease of native coronary artery without angina pectoris: Secondary | ICD-10-CM | POA: Diagnosis not present

## 2020-07-16 DIAGNOSIS — I69351 Hemiplegia and hemiparesis following cerebral infarction affecting right dominant side: Secondary | ICD-10-CM | POA: Diagnosis not present

## 2020-07-16 DIAGNOSIS — M1990 Unspecified osteoarthritis, unspecified site: Secondary | ICD-10-CM | POA: Diagnosis not present

## 2020-07-19 DIAGNOSIS — I2510 Atherosclerotic heart disease of native coronary artery without angina pectoris: Secondary | ICD-10-CM | POA: Diagnosis not present

## 2020-07-19 DIAGNOSIS — E1142 Type 2 diabetes mellitus with diabetic polyneuropathy: Secondary | ICD-10-CM | POA: Diagnosis not present

## 2020-07-19 DIAGNOSIS — E46 Unspecified protein-calorie malnutrition: Secondary | ICD-10-CM | POA: Diagnosis not present

## 2020-07-19 DIAGNOSIS — I10 Essential (primary) hypertension: Secondary | ICD-10-CM | POA: Diagnosis not present

## 2020-07-19 DIAGNOSIS — E785 Hyperlipidemia, unspecified: Secondary | ICD-10-CM | POA: Diagnosis not present

## 2020-07-19 DIAGNOSIS — M1991 Primary osteoarthritis, unspecified site: Secondary | ICD-10-CM | POA: Diagnosis not present

## 2020-07-19 DIAGNOSIS — I69354 Hemiplegia and hemiparesis following cerebral infarction affecting left non-dominant side: Secondary | ICD-10-CM | POA: Diagnosis not present

## 2020-07-19 DIAGNOSIS — M25512 Pain in left shoulder: Secondary | ICD-10-CM | POA: Diagnosis not present

## 2020-07-19 DIAGNOSIS — G5603 Carpal tunnel syndrome, bilateral upper limbs: Secondary | ICD-10-CM | POA: Diagnosis not present

## 2020-07-20 DIAGNOSIS — I2510 Atherosclerotic heart disease of native coronary artery without angina pectoris: Secondary | ICD-10-CM | POA: Diagnosis not present

## 2020-07-20 DIAGNOSIS — I69354 Hemiplegia and hemiparesis following cerebral infarction affecting left non-dominant side: Secondary | ICD-10-CM | POA: Diagnosis not present

## 2020-07-20 DIAGNOSIS — M25512 Pain in left shoulder: Secondary | ICD-10-CM | POA: Diagnosis not present

## 2020-07-20 DIAGNOSIS — I10 Essential (primary) hypertension: Secondary | ICD-10-CM | POA: Diagnosis not present

## 2020-07-20 DIAGNOSIS — E785 Hyperlipidemia, unspecified: Secondary | ICD-10-CM | POA: Diagnosis not present

## 2020-07-20 DIAGNOSIS — E1142 Type 2 diabetes mellitus with diabetic polyneuropathy: Secondary | ICD-10-CM | POA: Diagnosis not present

## 2020-07-20 DIAGNOSIS — M1991 Primary osteoarthritis, unspecified site: Secondary | ICD-10-CM | POA: Diagnosis not present

## 2020-07-24 DIAGNOSIS — I10 Essential (primary) hypertension: Secondary | ICD-10-CM | POA: Diagnosis not present

## 2020-07-24 DIAGNOSIS — G5603 Carpal tunnel syndrome, bilateral upper limbs: Secondary | ICD-10-CM | POA: Diagnosis not present

## 2020-07-24 DIAGNOSIS — I2510 Atherosclerotic heart disease of native coronary artery without angina pectoris: Secondary | ICD-10-CM | POA: Diagnosis not present

## 2020-07-24 DIAGNOSIS — E785 Hyperlipidemia, unspecified: Secondary | ICD-10-CM | POA: Diagnosis not present

## 2020-07-24 DIAGNOSIS — M25512 Pain in left shoulder: Secondary | ICD-10-CM | POA: Diagnosis not present

## 2020-07-24 DIAGNOSIS — E1142 Type 2 diabetes mellitus with diabetic polyneuropathy: Secondary | ICD-10-CM | POA: Diagnosis not present

## 2020-07-24 DIAGNOSIS — E46 Unspecified protein-calorie malnutrition: Secondary | ICD-10-CM | POA: Diagnosis not present

## 2020-07-24 DIAGNOSIS — I69354 Hemiplegia and hemiparesis following cerebral infarction affecting left non-dominant side: Secondary | ICD-10-CM | POA: Diagnosis not present

## 2020-07-24 DIAGNOSIS — M1991 Primary osteoarthritis, unspecified site: Secondary | ICD-10-CM | POA: Diagnosis not present

## 2020-08-03 DIAGNOSIS — I69354 Hemiplegia and hemiparesis following cerebral infarction affecting left non-dominant side: Secondary | ICD-10-CM | POA: Diagnosis not present

## 2020-08-03 DIAGNOSIS — I2510 Atherosclerotic heart disease of native coronary artery without angina pectoris: Secondary | ICD-10-CM | POA: Diagnosis not present

## 2020-08-03 DIAGNOSIS — M1991 Primary osteoarthritis, unspecified site: Secondary | ICD-10-CM | POA: Diagnosis not present

## 2020-08-03 DIAGNOSIS — M25512 Pain in left shoulder: Secondary | ICD-10-CM | POA: Diagnosis not present

## 2020-08-03 DIAGNOSIS — E785 Hyperlipidemia, unspecified: Secondary | ICD-10-CM | POA: Diagnosis not present

## 2020-08-03 DIAGNOSIS — G5603 Carpal tunnel syndrome, bilateral upper limbs: Secondary | ICD-10-CM | POA: Diagnosis not present

## 2020-08-03 DIAGNOSIS — E1142 Type 2 diabetes mellitus with diabetic polyneuropathy: Secondary | ICD-10-CM | POA: Diagnosis not present

## 2020-08-03 DIAGNOSIS — E46 Unspecified protein-calorie malnutrition: Secondary | ICD-10-CM | POA: Diagnosis not present

## 2020-08-03 DIAGNOSIS — I10 Essential (primary) hypertension: Secondary | ICD-10-CM | POA: Diagnosis not present

## 2020-08-14 DIAGNOSIS — I10 Essential (primary) hypertension: Secondary | ICD-10-CM | POA: Diagnosis not present

## 2020-08-14 DIAGNOSIS — M25512 Pain in left shoulder: Secondary | ICD-10-CM | POA: Diagnosis not present

## 2020-08-14 DIAGNOSIS — M160 Bilateral primary osteoarthritis of hip: Secondary | ICD-10-CM | POA: Diagnosis not present

## 2020-08-14 DIAGNOSIS — G8194 Hemiplegia, unspecified affecting left nondominant side: Secondary | ICD-10-CM | POA: Diagnosis not present

## 2020-08-14 DIAGNOSIS — M19012 Primary osteoarthritis, left shoulder: Secondary | ICD-10-CM | POA: Diagnosis not present

## 2020-08-14 DIAGNOSIS — R300 Dysuria: Secondary | ICD-10-CM | POA: Diagnosis not present

## 2020-08-15 DIAGNOSIS — Z7984 Long term (current) use of oral hypoglycemic drugs: Secondary | ICD-10-CM | POA: Diagnosis not present

## 2020-08-15 DIAGNOSIS — E785 Hyperlipidemia, unspecified: Secondary | ICD-10-CM | POA: Diagnosis not present

## 2020-08-15 DIAGNOSIS — I69351 Hemiplegia and hemiparesis following cerebral infarction affecting right dominant side: Secondary | ICD-10-CM | POA: Diagnosis not present

## 2020-08-15 DIAGNOSIS — I10 Essential (primary) hypertension: Secondary | ICD-10-CM | POA: Diagnosis not present

## 2020-08-15 DIAGNOSIS — M1990 Unspecified osteoarthritis, unspecified site: Secondary | ICD-10-CM | POA: Diagnosis not present

## 2020-08-15 DIAGNOSIS — E1142 Type 2 diabetes mellitus with diabetic polyneuropathy: Secondary | ICD-10-CM | POA: Diagnosis not present

## 2020-08-15 DIAGNOSIS — I2510 Atherosclerotic heart disease of native coronary artery without angina pectoris: Secondary | ICD-10-CM | POA: Diagnosis not present

## 2020-08-20 DIAGNOSIS — R809 Proteinuria, unspecified: Secondary | ICD-10-CM | POA: Diagnosis not present

## 2020-08-20 DIAGNOSIS — E1129 Type 2 diabetes mellitus with other diabetic kidney complication: Secondary | ICD-10-CM | POA: Diagnosis not present

## 2020-08-22 DIAGNOSIS — R300 Dysuria: Secondary | ICD-10-CM | POA: Diagnosis not present

## 2020-09-03 DIAGNOSIS — E1142 Type 2 diabetes mellitus with diabetic polyneuropathy: Secondary | ICD-10-CM | POA: Diagnosis not present

## 2020-09-03 DIAGNOSIS — G8194 Hemiplegia, unspecified affecting left nondominant side: Secondary | ICD-10-CM | POA: Diagnosis not present

## 2020-09-03 DIAGNOSIS — I779 Disorder of arteries and arterioles, unspecified: Secondary | ICD-10-CM | POA: Diagnosis not present

## 2020-09-03 DIAGNOSIS — E119 Type 2 diabetes mellitus without complications: Secondary | ICD-10-CM | POA: Diagnosis not present

## 2020-09-03 DIAGNOSIS — E7800 Pure hypercholesterolemia, unspecified: Secondary | ICD-10-CM | POA: Diagnosis not present

## 2020-09-03 DIAGNOSIS — I10 Essential (primary) hypertension: Secondary | ICD-10-CM | POA: Diagnosis not present

## 2020-09-21 DIAGNOSIS — I779 Disorder of arteries and arterioles, unspecified: Secondary | ICD-10-CM | POA: Diagnosis not present

## 2020-09-21 DIAGNOSIS — I10 Essential (primary) hypertension: Secondary | ICD-10-CM | POA: Diagnosis not present

## 2020-09-21 DIAGNOSIS — M19042 Primary osteoarthritis, left hand: Secondary | ICD-10-CM | POA: Diagnosis not present

## 2020-09-21 DIAGNOSIS — M160 Bilateral primary osteoarthritis of hip: Secondary | ICD-10-CM | POA: Diagnosis not present

## 2020-09-21 DIAGNOSIS — D649 Anemia, unspecified: Secondary | ICD-10-CM | POA: Diagnosis not present

## 2020-09-21 DIAGNOSIS — E1142 Type 2 diabetes mellitus with diabetic polyneuropathy: Secondary | ICD-10-CM | POA: Diagnosis not present

## 2020-09-21 DIAGNOSIS — G5603 Carpal tunnel syndrome, bilateral upper limbs: Secondary | ICD-10-CM | POA: Diagnosis not present

## 2020-09-21 DIAGNOSIS — I69354 Hemiplegia and hemiparesis following cerebral infarction affecting left non-dominant side: Secondary | ICD-10-CM | POA: Diagnosis not present

## 2020-09-21 DIAGNOSIS — M19041 Primary osteoarthritis, right hand: Secondary | ICD-10-CM | POA: Diagnosis not present

## 2020-09-27 DIAGNOSIS — M19041 Primary osteoarthritis, right hand: Secondary | ICD-10-CM | POA: Diagnosis not present

## 2020-09-27 DIAGNOSIS — G5603 Carpal tunnel syndrome, bilateral upper limbs: Secondary | ICD-10-CM | POA: Diagnosis not present

## 2020-09-27 DIAGNOSIS — M160 Bilateral primary osteoarthritis of hip: Secondary | ICD-10-CM | POA: Diagnosis not present

## 2020-09-27 DIAGNOSIS — I69354 Hemiplegia and hemiparesis following cerebral infarction affecting left non-dominant side: Secondary | ICD-10-CM | POA: Diagnosis not present

## 2020-09-27 DIAGNOSIS — E1142 Type 2 diabetes mellitus with diabetic polyneuropathy: Secondary | ICD-10-CM | POA: Diagnosis not present

## 2020-09-27 DIAGNOSIS — I779 Disorder of arteries and arterioles, unspecified: Secondary | ICD-10-CM | POA: Diagnosis not present

## 2020-09-27 DIAGNOSIS — M19042 Primary osteoarthritis, left hand: Secondary | ICD-10-CM | POA: Diagnosis not present

## 2020-09-27 DIAGNOSIS — I10 Essential (primary) hypertension: Secondary | ICD-10-CM | POA: Diagnosis not present

## 2020-09-27 DIAGNOSIS — D649 Anemia, unspecified: Secondary | ICD-10-CM | POA: Diagnosis not present

## 2020-09-28 DIAGNOSIS — I779 Disorder of arteries and arterioles, unspecified: Secondary | ICD-10-CM | POA: Diagnosis not present

## 2020-09-28 DIAGNOSIS — M160 Bilateral primary osteoarthritis of hip: Secondary | ICD-10-CM | POA: Diagnosis not present

## 2020-09-28 DIAGNOSIS — I10 Essential (primary) hypertension: Secondary | ICD-10-CM | POA: Diagnosis not present

## 2020-09-28 DIAGNOSIS — M19041 Primary osteoarthritis, right hand: Secondary | ICD-10-CM | POA: Diagnosis not present

## 2020-09-28 DIAGNOSIS — M19042 Primary osteoarthritis, left hand: Secondary | ICD-10-CM | POA: Diagnosis not present

## 2020-09-28 DIAGNOSIS — I69354 Hemiplegia and hemiparesis following cerebral infarction affecting left non-dominant side: Secondary | ICD-10-CM | POA: Diagnosis not present

## 2020-09-28 DIAGNOSIS — E1142 Type 2 diabetes mellitus with diabetic polyneuropathy: Secondary | ICD-10-CM | POA: Diagnosis not present

## 2020-10-02 DIAGNOSIS — M160 Bilateral primary osteoarthritis of hip: Secondary | ICD-10-CM | POA: Diagnosis not present

## 2020-10-02 DIAGNOSIS — I69354 Hemiplegia and hemiparesis following cerebral infarction affecting left non-dominant side: Secondary | ICD-10-CM | POA: Diagnosis not present

## 2020-10-02 DIAGNOSIS — I779 Disorder of arteries and arterioles, unspecified: Secondary | ICD-10-CM | POA: Diagnosis not present

## 2020-10-02 DIAGNOSIS — E1142 Type 2 diabetes mellitus with diabetic polyneuropathy: Secondary | ICD-10-CM | POA: Diagnosis not present

## 2020-10-02 DIAGNOSIS — G5603 Carpal tunnel syndrome, bilateral upper limbs: Secondary | ICD-10-CM | POA: Diagnosis not present

## 2020-10-02 DIAGNOSIS — M19042 Primary osteoarthritis, left hand: Secondary | ICD-10-CM | POA: Diagnosis not present

## 2020-10-02 DIAGNOSIS — D649 Anemia, unspecified: Secondary | ICD-10-CM | POA: Diagnosis not present

## 2020-10-02 DIAGNOSIS — M19041 Primary osteoarthritis, right hand: Secondary | ICD-10-CM | POA: Diagnosis not present

## 2020-10-02 DIAGNOSIS — I10 Essential (primary) hypertension: Secondary | ICD-10-CM | POA: Diagnosis not present

## 2020-10-09 DIAGNOSIS — M19041 Primary osteoarthritis, right hand: Secondary | ICD-10-CM | POA: Diagnosis not present

## 2020-10-09 DIAGNOSIS — I779 Disorder of arteries and arterioles, unspecified: Secondary | ICD-10-CM | POA: Diagnosis not present

## 2020-10-09 DIAGNOSIS — D649 Anemia, unspecified: Secondary | ICD-10-CM | POA: Diagnosis not present

## 2020-10-09 DIAGNOSIS — G5603 Carpal tunnel syndrome, bilateral upper limbs: Secondary | ICD-10-CM | POA: Diagnosis not present

## 2020-10-09 DIAGNOSIS — I69354 Hemiplegia and hemiparesis following cerebral infarction affecting left non-dominant side: Secondary | ICD-10-CM | POA: Diagnosis not present

## 2020-10-09 DIAGNOSIS — M160 Bilateral primary osteoarthritis of hip: Secondary | ICD-10-CM | POA: Diagnosis not present

## 2020-10-09 DIAGNOSIS — I10 Essential (primary) hypertension: Secondary | ICD-10-CM | POA: Diagnosis not present

## 2020-10-09 DIAGNOSIS — M19042 Primary osteoarthritis, left hand: Secondary | ICD-10-CM | POA: Diagnosis not present

## 2020-10-09 DIAGNOSIS — E1142 Type 2 diabetes mellitus with diabetic polyneuropathy: Secondary | ICD-10-CM | POA: Diagnosis not present

## 2020-10-15 DIAGNOSIS — M19042 Primary osteoarthritis, left hand: Secondary | ICD-10-CM | POA: Diagnosis not present

## 2020-10-15 DIAGNOSIS — G5603 Carpal tunnel syndrome, bilateral upper limbs: Secondary | ICD-10-CM | POA: Diagnosis not present

## 2020-10-15 DIAGNOSIS — I69354 Hemiplegia and hemiparesis following cerebral infarction affecting left non-dominant side: Secondary | ICD-10-CM | POA: Diagnosis not present

## 2020-10-15 DIAGNOSIS — M160 Bilateral primary osteoarthritis of hip: Secondary | ICD-10-CM | POA: Diagnosis not present

## 2020-10-15 DIAGNOSIS — I10 Essential (primary) hypertension: Secondary | ICD-10-CM | POA: Diagnosis not present

## 2020-10-15 DIAGNOSIS — E1142 Type 2 diabetes mellitus with diabetic polyneuropathy: Secondary | ICD-10-CM | POA: Diagnosis not present

## 2020-10-15 DIAGNOSIS — I779 Disorder of arteries and arterioles, unspecified: Secondary | ICD-10-CM | POA: Diagnosis not present

## 2020-10-15 DIAGNOSIS — D649 Anemia, unspecified: Secondary | ICD-10-CM | POA: Diagnosis not present

## 2020-10-15 DIAGNOSIS — M19041 Primary osteoarthritis, right hand: Secondary | ICD-10-CM | POA: Diagnosis not present

## 2020-11-29 DIAGNOSIS — H524 Presbyopia: Secondary | ICD-10-CM | POA: Diagnosis not present

## 2020-11-29 DIAGNOSIS — E113393 Type 2 diabetes mellitus with moderate nonproliferative diabetic retinopathy without macular edema, bilateral: Secondary | ICD-10-CM | POA: Diagnosis not present

## 2020-12-03 DIAGNOSIS — H04123 Dry eye syndrome of bilateral lacrimal glands: Secondary | ICD-10-CM | POA: Diagnosis not present

## 2020-12-03 DIAGNOSIS — H26493 Other secondary cataract, bilateral: Secondary | ICD-10-CM | POA: Diagnosis not present

## 2020-12-03 DIAGNOSIS — H35721 Serous detachment of retinal pigment epithelium, right eye: Secondary | ICD-10-CM | POA: Diagnosis not present

## 2020-12-14 DIAGNOSIS — E113511 Type 2 diabetes mellitus with proliferative diabetic retinopathy with macular edema, right eye: Secondary | ICD-10-CM | POA: Diagnosis not present

## 2020-12-14 DIAGNOSIS — H31093 Other chorioretinal scars, bilateral: Secondary | ICD-10-CM | POA: Diagnosis not present

## 2021-01-01 DIAGNOSIS — E1129 Type 2 diabetes mellitus with other diabetic kidney complication: Secondary | ICD-10-CM | POA: Diagnosis not present

## 2021-01-01 DIAGNOSIS — R809 Proteinuria, unspecified: Secondary | ICD-10-CM | POA: Diagnosis not present

## 2021-01-08 DIAGNOSIS — I10 Essential (primary) hypertension: Secondary | ICD-10-CM | POA: Diagnosis not present

## 2021-01-08 DIAGNOSIS — G8194 Hemiplegia, unspecified affecting left nondominant side: Secondary | ICD-10-CM | POA: Diagnosis not present

## 2021-01-08 DIAGNOSIS — R809 Proteinuria, unspecified: Secondary | ICD-10-CM | POA: Diagnosis not present

## 2021-01-08 DIAGNOSIS — E1142 Type 2 diabetes mellitus with diabetic polyneuropathy: Secondary | ICD-10-CM | POA: Diagnosis not present

## 2021-01-08 DIAGNOSIS — E7800 Pure hypercholesterolemia, unspecified: Secondary | ICD-10-CM | POA: Diagnosis not present

## 2021-01-08 DIAGNOSIS — E1129 Type 2 diabetes mellitus with other diabetic kidney complication: Secondary | ICD-10-CM | POA: Diagnosis not present

## 2021-01-09 DIAGNOSIS — E113511 Type 2 diabetes mellitus with proliferative diabetic retinopathy with macular edema, right eye: Secondary | ICD-10-CM | POA: Diagnosis not present

## 2021-01-18 DIAGNOSIS — B351 Tinea unguium: Secondary | ICD-10-CM | POA: Diagnosis not present

## 2021-01-18 DIAGNOSIS — M722 Plantar fascial fibromatosis: Secondary | ICD-10-CM | POA: Diagnosis not present

## 2021-01-18 DIAGNOSIS — M79675 Pain in left toe(s): Secondary | ICD-10-CM | POA: Diagnosis not present

## 2021-01-18 DIAGNOSIS — M79674 Pain in right toe(s): Secondary | ICD-10-CM | POA: Diagnosis not present

## 2021-01-18 DIAGNOSIS — L600 Ingrowing nail: Secondary | ICD-10-CM | POA: Diagnosis not present

## 2021-01-18 DIAGNOSIS — E119 Type 2 diabetes mellitus without complications: Secondary | ICD-10-CM | POA: Diagnosis not present

## 2021-01-23 DIAGNOSIS — M722 Plantar fascial fibromatosis: Secondary | ICD-10-CM | POA: Diagnosis not present

## 2021-01-23 DIAGNOSIS — M7732 Calcaneal spur, left foot: Secondary | ICD-10-CM | POA: Diagnosis not present

## 2021-01-23 DIAGNOSIS — M79672 Pain in left foot: Secondary | ICD-10-CM | POA: Diagnosis not present

## 2021-04-27 IMAGING — CR DG HAND 2V*L*
1 series · 2 of 2 positions shown · non-contrast
Comparison: None.

CLINICAL DATA: Left hand and wrist pain.  No injury.

EXAM:
LEFT HAND - 2 VIEW

[Series 1: dg hand 2 view left · 0.14mm/px · 2 of 2 slices shown]
[im 1/2]
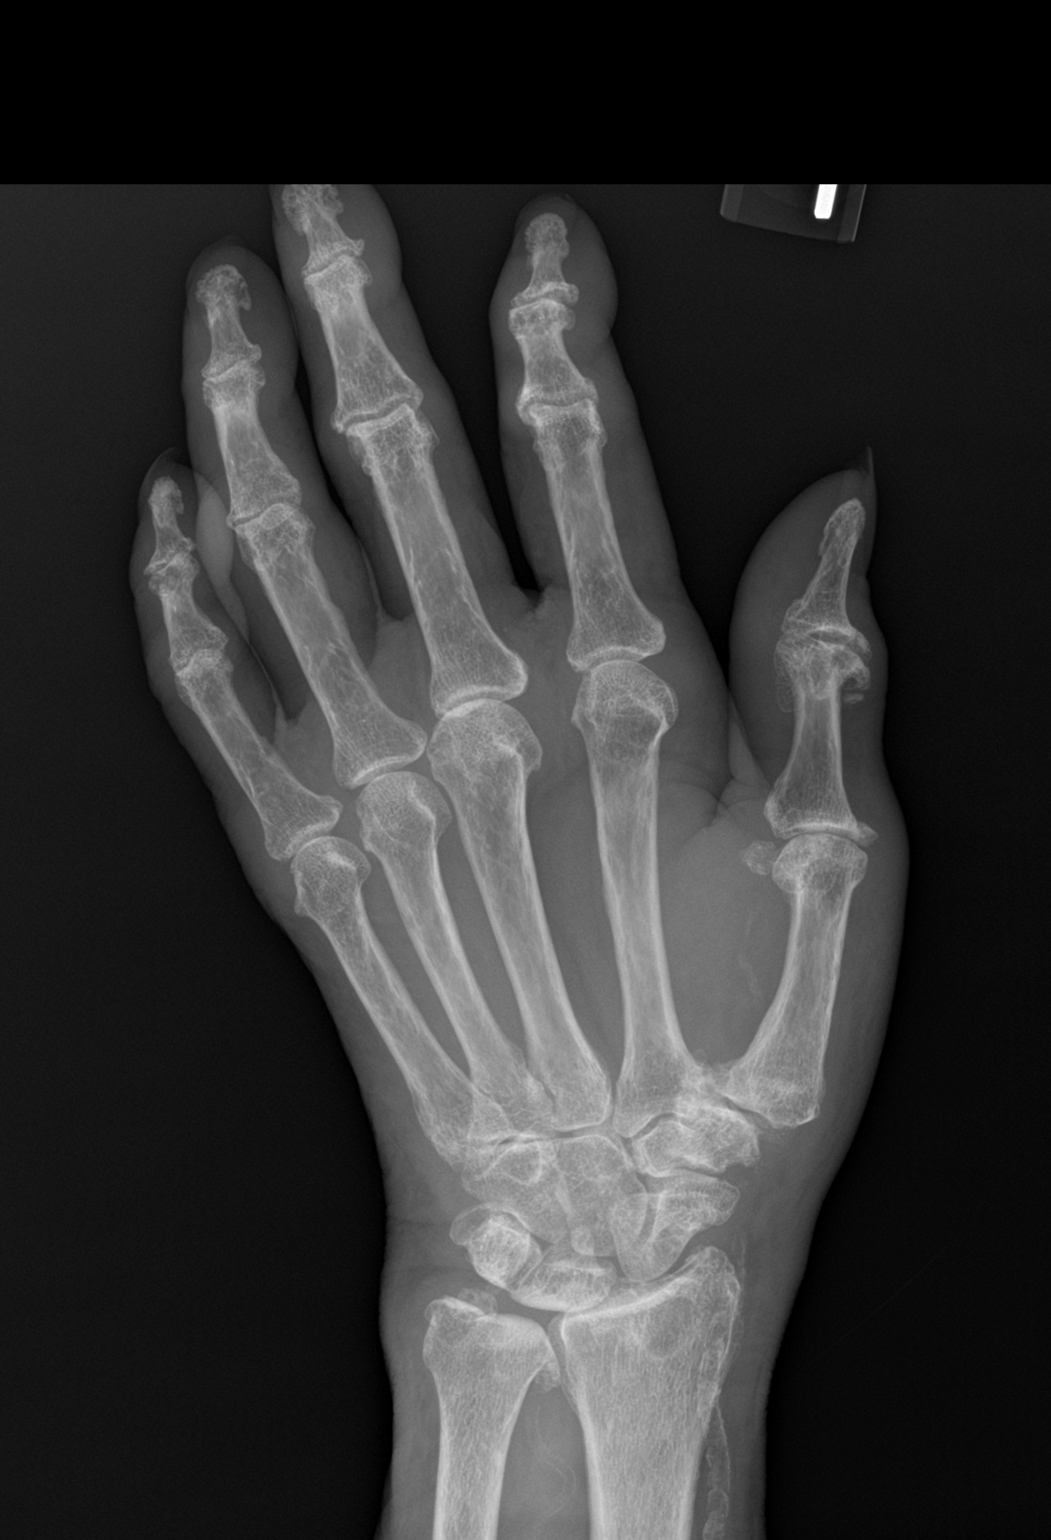
[im 2/2]
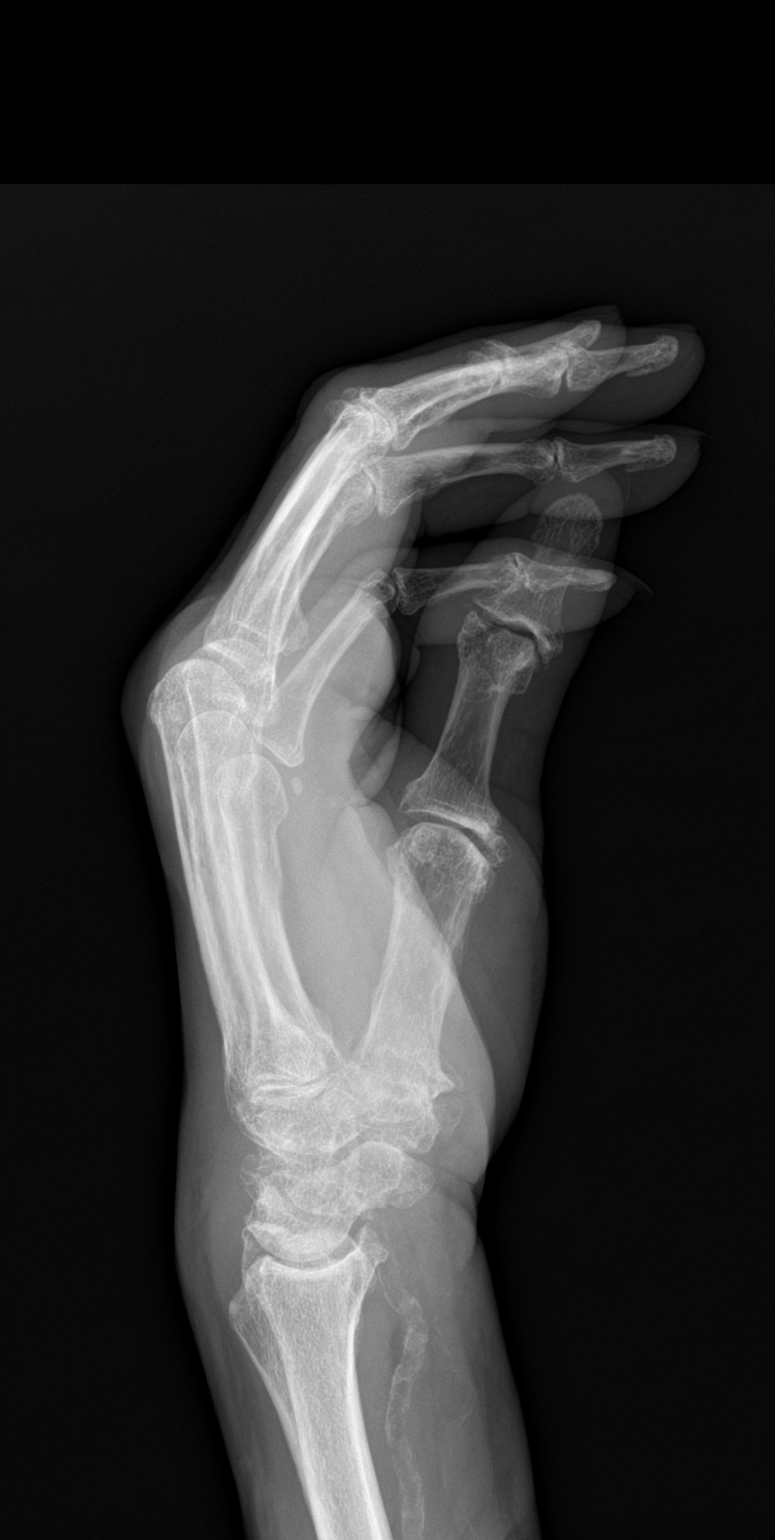

[2 of 2 positions shown; findings below may reference images not displayed]

FINDINGS: Moderate degenerative changes over the radiocarpal joint and distal
radioulnar joints. Suggestion of an old ulnar styloid fracture.
Degenerative change of the carpal bones and first carpometacarpal
joints. Mild degenerative change throughout the interphalangeal
joints and minimal degenerative change involving the MCP joints. No
definite bony erosions. Alignment and mineralization is normal.
Small vessel atherosclerotic disease.
IMPRESSION: 1. No acute findings.
2. Moderate degenerative changes as described.  No erosive changes.

## 2021-05-15 ENCOUNTER — Inpatient Hospital Stay
Admission: EM | Admit: 2021-05-15 | Discharge: 2021-05-17 | DRG: 603 | Disposition: A | Discharge status: Home Health | Payer: Medicare HMO | Attending: Internal Medicine | Admitting: Internal Medicine

## 2021-05-15 ENCOUNTER — Other Ambulatory Visit: Payer: Self-pay

## 2021-05-15 ENCOUNTER — Encounter: Payer: Self-pay | Admitting: Emergency Medicine

## 2021-05-15 ENCOUNTER — Emergency Department: Payer: Medicare HMO

## 2021-05-15 DIAGNOSIS — R600 Localized edema: Secondary | ICD-10-CM | POA: Diagnosis not present

## 2021-05-15 DIAGNOSIS — Z993 Dependence on wheelchair: Secondary | ICD-10-CM

## 2021-05-15 DIAGNOSIS — Z7902 Long term (current) use of antithrombotics/antiplatelets: Secondary | ICD-10-CM | POA: Diagnosis not present

## 2021-05-15 DIAGNOSIS — G8194 Hemiplegia, unspecified affecting left nondominant side: Secondary | ICD-10-CM | POA: Diagnosis not present

## 2021-05-15 DIAGNOSIS — Z803 Family history of malignant neoplasm of breast: Secondary | ICD-10-CM

## 2021-05-15 DIAGNOSIS — E876 Hypokalemia: Secondary | ICD-10-CM | POA: Diagnosis present

## 2021-05-15 DIAGNOSIS — Z79899 Other long term (current) drug therapy: Secondary | ICD-10-CM

## 2021-05-15 DIAGNOSIS — L03116 Cellulitis of left lower limb: Principal | ICD-10-CM | POA: Diagnosis present

## 2021-05-15 DIAGNOSIS — M7989 Other specified soft tissue disorders: Secondary | ICD-10-CM | POA: Diagnosis not present

## 2021-05-15 DIAGNOSIS — Z7982 Long term (current) use of aspirin: Secondary | ICD-10-CM

## 2021-05-15 DIAGNOSIS — Z7984 Long term (current) use of oral hypoglycemic drugs: Secondary | ICD-10-CM

## 2021-05-15 DIAGNOSIS — Z8673 Personal history of transient ischemic attack (TIA), and cerebral infarction without residual deficits: Secondary | ICD-10-CM | POA: Diagnosis not present

## 2021-05-15 DIAGNOSIS — I10 Essential (primary) hypertension: Secondary | ICD-10-CM | POA: Diagnosis not present

## 2021-05-15 DIAGNOSIS — I739 Peripheral vascular disease, unspecified: Secondary | ICD-10-CM

## 2021-05-15 DIAGNOSIS — Z872 Personal history of diseases of the skin and subcutaneous tissue: Secondary | ICD-10-CM | POA: Diagnosis not present

## 2021-05-15 DIAGNOSIS — Z96653 Presence of artificial knee joint, bilateral: Secondary | ICD-10-CM | POA: Diagnosis present

## 2021-05-15 DIAGNOSIS — M79605 Pain in left leg: Secondary | ICD-10-CM | POA: Diagnosis not present

## 2021-05-15 DIAGNOSIS — E1151 Type 2 diabetes mellitus with diabetic peripheral angiopathy without gangrene: Secondary | ICD-10-CM | POA: Diagnosis present

## 2021-05-15 DIAGNOSIS — E785 Hyperlipidemia, unspecified: Secondary | ICD-10-CM | POA: Diagnosis not present

## 2021-05-15 DIAGNOSIS — E1142 Type 2 diabetes mellitus with diabetic polyneuropathy: Secondary | ICD-10-CM | POA: Diagnosis not present

## 2021-05-15 DIAGNOSIS — I69354 Hemiplegia and hemiparesis following cerebral infarction affecting left non-dominant side: Secondary | ICD-10-CM | POA: Diagnosis not present

## 2021-05-15 DIAGNOSIS — L0390 Cellulitis, unspecified: Principal | ICD-10-CM

## 2021-05-15 LAB — CBC
HCT: 33 % — ABNORMAL LOW (ref 36.0–46.0)
Hemoglobin: 10.5 g/dL — ABNORMAL LOW (ref 12.0–15.0)
MCH: 31.3 pg (ref 26.0–34.0)
MCHC: 31.8 g/dL (ref 30.0–36.0)
MCV: 98.2 fL (ref 80.0–100.0)
Platelets: 301 10*3/uL (ref 150–400)
RBC: 3.36 MIL/uL — ABNORMAL LOW (ref 3.87–5.11)
RDW: 12.2 % (ref 11.5–15.5)
WBC: 11.1 10*3/uL — ABNORMAL HIGH (ref 4.0–10.5)
nRBC: 0 % (ref 0.0–0.2)

## 2021-05-15 LAB — COMPREHENSIVE METABOLIC PANEL
ALT: 17 U/L (ref 0–44)
AST: 21 U/L (ref 15–41)
Albumin: 3 g/dL — ABNORMAL LOW (ref 3.5–5.0)
Alkaline Phosphatase: 83 U/L (ref 38–126)
Anion gap: 8 (ref 5–15)
BUN: 17 mg/dL (ref 8–23)
CO2: 30 mmol/L (ref 22–32)
Calcium: 8.4 mg/dL — ABNORMAL LOW (ref 8.9–10.3)
Chloride: 100 mmol/L (ref 98–111)
Creatinine, Ser: 0.7 mg/dL (ref 0.44–1.00)
GFR, Estimated: >60 mL/min (ref >60)
Glucose, Bld: 271 mg/dL — ABNORMAL HIGH (ref 70–99)
Potassium: 3.1 mmol/L — ABNORMAL LOW (ref 3.5–5.1)
Sodium: 138 mmol/L (ref 135–145)
Total Bilirubin: 0.8 mg/dL (ref 0.3–1.2)
Total Protein: 6.6 g/dL (ref 6.5–8.1)

## 2021-05-15 LAB — GLUCOSE, CAPILLARY
Glucose-Capillary: 291 mg/dL — ABNORMAL HIGH (ref 70–99)
Glucose-Capillary: 256 mg/dL — ABNORMAL HIGH (ref 70–99)

## 2021-05-15 LAB — LACTIC ACID, PLASMA: Lactic Acid, Venous: 1.4 mmol/L (ref 0.5–1.9)

## 2021-05-15 MED ORDER — LISINOPRIL 5 MG PO TABS
5.0000 mg | ORAL_TABLET | Freq: Every day | ORAL | Status: DC
  Administered 2021-05-16 – 2021-05-17 (×2): 5 mg via ORAL
  Filled 2021-05-15 (×2): qty 1

## 2021-05-15 MED ORDER — METFORMIN HCL 500 MG PO TABS: 1000.0000 mg | ORAL_TABLET | Freq: Two times a day (BID) | ORAL | Status: DC

## 2021-05-15 MED ORDER — VANCOMYCIN HCL IN DEXTROSE 1-5 GM/200ML-% IV SOLN
1000.0000 mg | Freq: Once | INTRAVENOUS | Status: AC
  Administered 2021-05-15: 1000 mg via INTRAVENOUS
  Filled 2021-05-15: qty 200

## 2021-05-15 MED ORDER — ATORVASTATIN CALCIUM 20 MG PO TABS
80.0000 mg | ORAL_TABLET | Freq: Every day | ORAL | Status: DC
  Administered 2021-05-16 – 2021-05-17 (×2): 80 mg via ORAL
  Filled 2021-05-15 (×2): qty 4

## 2021-05-15 MED ORDER — CEFAZOLIN SODIUM-DEXTROSE 1-4 GM/50ML-% IV SOLN
1.0000 g | Freq: Three times a day (TID) | INTRAVENOUS | Status: DC
  Administered 2021-05-15 – 2021-05-17 (×6): 1 g via INTRAVENOUS
  Filled 2021-05-15 (×7): qty 50

## 2021-05-15 MED ORDER — ASPIRIN EC 325 MG PO TBEC
325.0000 mg | DELAYED_RELEASE_TABLET | Freq: Every day | ORAL | Status: DC
  Administered 2021-05-16 – 2021-05-17 (×2): 325 mg via ORAL
  Filled 2021-05-15 (×2): qty 1

## 2021-05-15 MED ORDER — INSULIN ASPART 100 UNIT/ML IJ SOLN
0.0000 [IU] | Freq: Three times a day (TID) | INTRAMUSCULAR | Status: DC
  Administered 2021-05-15: 5 [IU] via SUBCUTANEOUS
  Administered 2021-05-16: 1 [IU] via SUBCUTANEOUS
  Administered 2021-05-16 (×2): 3 [IU] via SUBCUTANEOUS
  Administered 2021-05-17 (×2): 1 [IU] via SUBCUTANEOUS
  Filled 2021-05-15 (×6): qty 1

## 2021-05-15 MED ORDER — LISINOPRIL 10 MG PO TABS
20.0000 mg | ORAL_TABLET | Freq: Every day | ORAL | Status: DC
Start: 2021-05-15 — End: 2021-05-15

## 2021-05-15 MED ORDER — ENOXAPARIN SODIUM 40 MG/0.4ML IJ SOSY
40.0000 mg | PREFILLED_SYRINGE | INTRAMUSCULAR | Status: DC
  Administered 2021-05-15 – 2021-05-16 (×2): 40 mg via SUBCUTANEOUS
  Filled 2021-05-15 (×2): qty 0.4

## 2021-05-15 MED ORDER — VITAMIN B-12 100 MCG PO TABS
100.0000 ug | ORAL_TABLET | Freq: Every day | ORAL | Status: DC
  Administered 2021-05-16 – 2021-05-17 (×2): 100 ug via ORAL
  Filled 2021-05-15 (×2): qty 1

## 2021-05-15 MED ORDER — ACETAMINOPHEN 325 MG PO TABS
650.0000 mg | ORAL_TABLET | Freq: Four times a day (QID) | ORAL | Status: DC | PRN
  Administered 2021-05-15 – 2021-05-17 (×5): 650 mg via ORAL
  Filled 2021-05-15 (×5): qty 2

## 2021-05-15 MED ORDER — POTASSIUM CHLORIDE 20 MEQ PO PACK
40.0000 meq | PACK | ORAL | Status: AC
  Administered 2021-05-15 (×2): 40 meq via ORAL
  Filled 2021-05-15 (×2): qty 2

## 2021-05-15 MED ORDER — DOXYCYCLINE HYCLATE 100 MG PO TABS
100.0000 mg | ORAL_TABLET | Freq: Two times a day (BID) | ORAL | Status: DC
  Administered 2021-05-15 – 2021-05-17 (×4): 100 mg via ORAL
  Filled 2021-05-15 (×4): qty 1

## 2021-05-15 MED ORDER — CLOPIDOGREL BISULFATE 75 MG PO TABS
75.0000 mg | ORAL_TABLET | Freq: Every day | ORAL | Status: DC
Start: 2021-05-15 — End: 2021-05-17
  Administered 2021-05-16 – 2021-05-17 (×2): 75 mg via ORAL
  Filled 2021-05-15 (×2): qty 1

## 2021-05-15 NOTE — Plan of Care (Signed)

## 2021-05-15 NOTE — ED Notes (Signed)
Pt presents to the ED for LLE redness, edema, and pain that has been going on the past couple of months. Pt was seen at the podiatrist a couple of months ago and received a cortisone shot and ever since then the pain and swelling has gotten worse. Pt does not normally get up and out of bed. Son states a couple of weeks ago he started noticing open wounds on the bottoms of her feet and now they are weeping. Pt is A&Ox4 and NAD. Pt states pain is 10/10. Denies any blood thinner use. Denies hx of DVT.  ? ?Pedal doppler at bedside, pulses present in the LLE, LLE is obviously swollen, red, and tender to touch.  ?

## 2021-05-15 NOTE — ED Provider Notes (Signed)
? ?  [facility] ?Provider Note ? ? ? Event Date/Time  ? First MD Initiated Contact with Patient 05/15/21 1413   ?  (approximate) ? ?History  ? ?Chief Complaint: Foot Pain and Leg Swelling ? ?HPI ? ?[name] is a 88 y.o. female with a past medical history of diabetes, hypertension, hyperlipidemia, presents emergency department for left lower extremity swelling redness discomfort and drainage.  According to the son over the past few weeks he has noticed several lesions to the patient's left foot.  Over the last 2 days or so he has noticed that the leg has become swollen and red and is now weeping.  Patient states increased pain to the leg as well.  Patient is largely wheelchair-bound per son.  Son lives nearby and helps care for the patient.  No reported fever.  Patient takes aspirin/Plavix but no further anticoagulation. ? ?Physical Exam  ? ?Triage Vital Signs: ?ED Triage Vitals  ?Enc Vitals Group  ?   BP 05/15/21 1325 (!) 169/70  ?   Pulse Rate 05/15/21 1325 62  ?   Resp 05/15/21 1325 18  ?   Temp 05/15/21 1325 97.9 ?F (36.6 ?C)  ?   Temp Source 05/15/21 1325 Oral  ?   SpO2 05/15/21 1325 100 %  ?   Weight 05/15/21 1312 167 lb 15.9 oz (76.2 kg)  ?   Height 05/15/21 1312 5\' 4"  (1.626 m)  ?   Head Circumference --   ?   Peak Flow --   ?   Pain Score 05/15/21 1312 10  ?   Pain Loc --   ?   Pain Edu? --   ?   Excl. in GC? --   ? ? ?Most recent vital signs: ?Vitals:  ? 05/15/21 1325  ?BP: (!) 169/70  ?Pulse: 62  ?Resp: 18  ?Temp: 97.9 ?F (36.6 ?C)  ?SpO2: 100%  ? ? ?General: Awake, no distress.  ?CV:  Good peripheral perfusion.  Regular rate and rhythm  ?Resp:  Normal effort.  Equal breath sounds bilaterally.  ?Abd:  No distention.  Soft, nontender.  No rebound or guarding. ?Other:  Moderate edema to left lower extremity pitting, with some weeping, erythema with tenderness to palpation.  Warm lower extremity. ? ? ?ED Results / Procedures / Treatments  ? ?RADIOLOGY ? ?Ultrasound  pending ? ? ?MEDICATIONS ORDERED IN ED: ?Medications - No data to display ? ? ?IMPRESSION / MDM / ASSESSMENT AND PLAN / ED COURSE  ?I reviewed the triage vital signs and the nursing notes. ? ?Patient presents emergency department for left lower extremity redness swelling and discomfort.  Patient has mild weeping of the leg as well.  Normal right lower extremity.  Extremities warm no concern for arterial occlusion.  Differential would include DVT versus more likely cellulitis.  We will check labs including blood culture and lactate.  We will obtain an ultrasound of the lower extremity.  If the ultrasound is negative we will dose IV antibiotics.  Patient care signed out to oncoming provider, disposition pending. ? ?FINAL CLINICAL IMPRESSION(S) / ED DIAGNOSES  ? ?Left lower extremity swelling ?Cellulitis ? ? ?Note:  This document was prepared using Dragon voice recognition software and may include unintentional dictation errors. ?  ?07/15/21, MD ?05/15/21 1426 ? ?

## 2021-05-15 NOTE — H&P (Signed)
?History and Physical  ? ? ?Patient: [name] DGU:[id number] DOB: 22-Jun-1933 ?DOA: 05/15/2021 ?DOS: the patient was seen and examined on 05/15/2021 ?PCP: Gracelyn Nurse, MD  ?Patient coming from: Home ? ?Chief Complaint:  ?Chief Complaint  ?Patient presents with  ? Foot Pain  ? Leg Swelling  ? ?HPI: [name] is a 88 y.o. female with medical history significant of stroke, type 2 diabetes, essential hypertension, dyslipidemia who presents to the hospital with a left leg redness and pain. ? ?Symptoms started 3 weeks ago, patient has seen by podiatry, started on oral antibiotics.  But condition has not been getting better since then.  She denies any fever or chills.  No nausea vomiting. ? ?In the emergency room, she had temperature 97.9, heart rate 62, respirate 18, blood pressure 169/70.  White cell 11.1.  Potassium 3.1. ? ?Review of Systems: As mentioned in the history of present illness. All other systems reviewed and are negative. ?Past Medical History:  ?Diagnosis Date  ? Carpal tunnel syndrome on both sides   ? Diabetes (HCC)   ? Diabetic polyneuropathy (HCC)   ? Diverticulitis   ? Hyperlipidemia   ? Hypertension   ? Osteoarthritis   ? Vaginal prolapse   ? ?Past Surgical History:  ?Procedure Laterality Date  ? CESAREAN SECTION    ? pessary    ? REPLACEMENT TOTAL KNEE BILATERAL    ? VENTRAL HERNIA REPAIR    ? VENTRAL HERNIA REPAIR N/A 01/13/2016  ? Procedure: HERNIA REPAIR VENTRAL ADULT WITH SMALL BOWEL RESECTION, LYSIS OF ADHESIONS;  Surgeon: Gladis Riffle, MD;  Location: [facility] ORS;  Service: General;  Laterality: N/A;  ? ?Social History:  reports that she has never smoked. She has never used smokeless tobacco. She reports that she does not drink alcohol and does not use drugs. ? ?No Known Allergies ? ?Family History  ?Problem Relation Age of Onset  ? Breast cancer Mother 12  ? Breast cancer Maternal Aunt   ? ? ?Prior to Admission medications   ?Medication Sig Start Date End Date Taking?  Authorizing Provider  ?acetaminophen (TYLENOL) 325 MG tablet Take 2 tablets (650 mg total) by mouth every 6 (six) hours as needed for mild pain, moderate pain, fever or headache. 01/25/16   Gladis Riffle, MD  ?aspirin EC 325 MG EC tablet Take 1 tablet (325 mg total) by mouth daily. 09/26/19   Regalado, Belkys A, MD  ?atorvastatin (LIPITOR) 80 MG tablet Take 1 tablet (80 mg total) by mouth daily. 09/26/19   Regalado, Belkys A, MD  ?clopidogrel (PLAVIX) 75 MG tablet Take 1 tablet (75 mg total) by mouth daily. 09/26/19   Regalado, Belkys A, MD  ?glimepiride (AMARYL) 4 MG tablet Take 4 mg by mouth daily with breakfast.  ?Patient not taking: Reported on 11/14/2019    [provider]  ?glucose blood test strip 1 each by Other route as needed for other. Use as instructed    [provider]  ?hydrochlorothiazide (HYDRODIURIL) 12.5 MG tablet Take 12.5 mg by mouth daily.     [provider]  ?lisinopril (ZESTRIL) 20 MG tablet Take 20 mg by mouth daily.    [provider]  ?metFORMIN (GLUCOPHAGE) 1000 MG tablet Take 1,000 mg by mouth 2 (two) times daily with a meal.    [provider]  ?vitamin B-12 100 MCG tablet Take 1 tablet (100 mcg total) by mouth daily. 09/26/19   Regalado, Prentiss Bells, MD  ? ? ?Physical Exam: ?  Vitals:  ? 05/15/21 1325 05/15/21 1430 05/15/21 1600 05/15/21 1700  ?BP: (!) 169/70 (!) 139/59 (!) 159/69 138/63  ?Pulse: 62 61 60 64  ?Resp: 18 20 20 18   ?Temp: 97.9 ?F (36.6 ?C)     ?TempSrc: Oral     ?SpO2: 100% 100% 96% 100%  ?Weight:      ?Height:      ? ?Physical Exam ?Constitutional:   ?   Appearance: Normal appearance.  ?HENT:  ?   Head: Normocephalic and atraumatic.  ?   Nose: Nose normal.  ?   Mouth/Throat:  ?   Mouth: Mucous membranes are dry.  ?Eyes:  ?   Extraocular Movements: Extraocular movements intact.  ?   Conjunctiva/sclera: Conjunctivae normal.  ?   Pupils: Pupils are equal, round, and reactive to light.  ?Cardiovascular:  ?   Rate and Rhythm: Normal  rate and regular rhythm.  ?   Heart sounds: No murmur heard. ?Pulmonary:  ?   Effort: Pulmonary effort is normal. No respiratory distress.  ?   Breath sounds: Normal breath sounds. No wheezing.  ?Abdominal:  ?   General: Abdomen is flat. Bowel sounds are normal. There is no distension.  ?   Palpations: Abdomen is soft.  ?   Tenderness: There is no abdominal tenderness.  ?Musculoskeletal:     ?   General: Swelling and tenderness present. Normal range of motion.  ?   Cervical back: Normal range of motion and neck supple. No rigidity.  ?Skin: ?   General: Skin is warm and dry.  ?Neurological:  ?   General: No focal deficit present.  ?   Mental Status: She is alert and oriented to person, place, and time.  ?   Cranial Nerves: No cranial nerve deficit.  ?Psychiatric:     ?   Mood and Affect: Mood normal.     ?   Behavior: Behavior normal.  ?  ?Data Reviewed: ? ?Reviewed labs and the ultrasound as above. ? ?Assessment and Plan: ?* Left leg cellulitis ?Patient has significant left leg redness and warm to touch.  Consistent with cellulitis.  Duplex ultrasound did not show DVT. ?Patient does not have sepsis.  We will continue antibiotics with cefazolin.  No concern for MRSA ? ?History of stroke ?Continue statin and antiplatelets treatment ? ?Essential hypertension ?Continue some home medicines. ? ?Type 2 diabetes mellitus with polyneuropathy (HCC) ?Hold off oral diabetic medications, start sliding scale insulin. ? ? ?Hypokalemia. ?Repleted orally. ? ? Advance Care Planning:   Code Status: Full Code Full code ? ?Consults: None ? ?Family Communication: None at bedside ? ?Severity of Illness: ?The appropriate patient status for this patient is INPATIENT. Inpatient status is judged to be reasonable and necessary in order to provide the required intensity of service to ensure the patient's safety. The patient's presenting symptoms, physical exam findings, and initial radiographic and laboratory data in the context of their  chronic comorbidities is felt to place them at high risk for further clinical deterioration. Furthermore, it is not anticipated that the patient will be medically stable for discharge from the hospital within 2 midnights of admission.  ? ?* I certify that at the point of admission it is my clinical judgment that the patient will require inpatient hospital care spanning beyond 2 midnights from the point of admission due to high intensity of service, high risk for further deterioration and high frequency of surveillance required.* ? ?Author: ? , MD ?05/15/2021 5:40 PM ? ?For on  call review [email or web address].  ?

## 2021-05-15 NOTE — Assessment & Plan Note (Addendum)
Patient received cefazolin and oral doxycycline.  Currently not a concern of MRSA.  Condition much improved, will continue 5 days of Keflex. ?

## 2021-05-15 NOTE — Assessment & Plan Note (Signed)
Continue some home medicines. ?

## 2021-05-15 NOTE — Assessment & Plan Note (Signed)
Continue statin and antiplatelets treatment ?

## 2021-05-15 NOTE — ED Notes (Signed)
Dr. Goodman at bedside for reevaluation ?

## 2021-05-15 NOTE — ED Triage Notes (Addendum)
Pt comes into the ED via POV c/o left leg swelling and left foot pain.  Pt is diabetic and currently has small wounds on the foot.  Pt states she did get a cortisone injection about 2 months ago, but the pain isnt getting better and the wounds have gotten worse.  Family states they are not "open wounds", but they have discoloration.  PT now has the new swelling in the leg.  Pt in NAD at this time with even and unlabored respirations.  ?

## 2021-05-15 NOTE — Assessment & Plan Note (Addendum)
A1c 9.3, resume home dose medicine, add metformin.  Follow-up with PCP as outpatient. ?

## 2021-05-15 NOTE — ED Notes (Signed)
US at bedside

## 2021-05-16 DIAGNOSIS — L03116 Cellulitis of left lower limb: Secondary | ICD-10-CM | POA: Diagnosis not present

## 2021-05-16 DIAGNOSIS — G8191 Hemiplegia, unspecified affecting right dominant side: Secondary | ICD-10-CM | POA: Insufficient documentation

## 2021-05-16 DIAGNOSIS — I10 Essential (primary) hypertension: Secondary | ICD-10-CM | POA: Diagnosis not present

## 2021-05-16 LAB — CBC WITH DIFFERENTIAL/PLATELET
Abs Immature Granulocytes: 0.05 10*3/uL (ref 0.00–0.07)
Basophils Absolute: 0 10*3/uL (ref 0.0–0.1)
Basophils Relative: 0 %
Eosinophils Absolute: 0.3 10*3/uL (ref 0.0–0.5)
Eosinophils Relative: 3 %
HCT: 29.7 % — ABNORMAL LOW (ref 36.0–46.0)
Hemoglobin: 9.5 g/dL — ABNORMAL LOW (ref 12.0–15.0)
Immature Granulocytes: 1 %
Lymphocytes Relative: 28 %
Lymphs Abs: 2.7 10*3/uL (ref 0.7–4.0)
MCH: 30.8 pg (ref 26.0–34.0)
MCHC: 32 g/dL (ref 30.0–36.0)
MCV: 96.4 fL (ref 80.0–100.0)
Monocytes Absolute: 0.6 10*3/uL (ref 0.1–1.0)
Monocytes Relative: 6 %
Neutro Abs: 6.2 10*3/uL (ref 1.7–7.7)
Neutrophils Relative %: 62 %
Platelets: 267 10*3/uL (ref 150–400)
RBC: 3.08 MIL/uL — ABNORMAL LOW (ref 3.87–5.11)
RDW: 12 % (ref 11.5–15.5)
WBC: 9.9 10*3/uL (ref 4.0–10.5)
nRBC: 0 % (ref 0.0–0.2)

## 2021-05-16 LAB — BASIC METABOLIC PANEL
Anion gap: 6 (ref 5–15)
BUN: 16 mg/dL (ref 8–23)
CO2: 28 mmol/L (ref 22–32)
Calcium: 8 mg/dL — ABNORMAL LOW (ref 8.9–10.3)
Chloride: 103 mmol/L (ref 98–111)
Creatinine, Ser: 0.72 mg/dL (ref 0.44–1.00)
GFR, Estimated: >60 mL/min (ref >60)
Glucose, Bld: 232 mg/dL — ABNORMAL HIGH (ref 70–99)
Potassium: 4.4 mmol/L (ref 3.5–5.1)
Sodium: 137 mmol/L (ref 135–145)

## 2021-05-16 LAB — VITAMIN B12: Vitamin B-12: 432 pg/mL (ref 180–914)

## 2021-05-16 LAB — MAGNESIUM: Magnesium: 1.3 mg/dL — ABNORMAL LOW (ref 1.7–2.4)

## 2021-05-16 LAB — GLUCOSE, CAPILLARY
Glucose-Capillary: 208 mg/dL — ABNORMAL HIGH (ref 70–99)
Glucose-Capillary: 241 mg/dL — ABNORMAL HIGH (ref 70–99)
Glucose-Capillary: 123 mg/dL — ABNORMAL HIGH (ref 70–99)
Glucose-Capillary: 129 mg/dL — ABNORMAL HIGH (ref 70–99)

## 2021-05-16 LAB — IRON AND TIBC
Iron: 55 ug/dL (ref 28–170)
Saturation Ratios: 21 % (ref 10.4–31.8)
TIBC: 260 ug/dL (ref 250–450)
UIBC: 205 ug/dL

## 2021-05-16 LAB — FERRITIN: Ferritin: 178 ng/mL (ref 11–307)

## 2021-05-16 MED ORDER — INSULIN GLARGINE-YFGN 100 UNIT/ML ~~LOC~~ SOLN
15.0000 [IU] | Freq: Every day | SUBCUTANEOUS | Status: DC
  Administered 2021-05-16 – 2021-05-17 (×2): 15 [IU] via SUBCUTANEOUS
  Filled 2021-05-16 (×2): qty 0.15

## 2021-05-16 NOTE — Assessment & Plan Note (Deleted)
Patient has a right hemiparesis secondary to old stroke.  She is wheelchair-bound.  We will continue to follow. ?

## 2021-05-16 NOTE — Hospital Course (Addendum)
[name] is a 88 y.o. female with medical history significant of stroke, type 2 diabetes, essential hypertension, dyslipidemia who presents to the hospital with a left leg redness and pain. ?Duplex ultrasound negative for DVT.  She is treated with cefazolin for cellulitis. ?Patient also had significant leg pain even cellulitis has improved.  Duplex ultrasound showed severe peripheral vascular disease.  Discussed with Dr. Wyn Quaker, he preferred to see patient in office in a week. ?

## 2021-05-16 NOTE — Plan of Care (Signed)

## 2021-05-16 NOTE — Progress Notes (Signed)
?  Progress Note ? ? ?Patient: [name]:[id number] DOB: 1933/08/11 DOA: 05/15/2021     1 ?DOS: the patient was seen and examined on 05/16/2021 ?  ?Brief hospital course: ?[name] is a 88 y.o. female with medical history significant of stroke, type 2 diabetes, essential hypertension, dyslipidemia who presents to the hospital with a left leg redness and pain. ?Duplex ultrasound negative for DVT.  She is treated with cefazolin for cellulitis. ? ?Assessment and Plan: ?* Left leg cellulitis ?Condition much improved today, will continue 1 more day of IV antibiotics.  Probably can be discharged home tomorrow.  Blood culture still pending but no growth so far. ? ?Hypokalemia ?Received IV potassium, potassium has normalized. ? ?Left hemiparesis (HCC) ?Patient has a right hemiparesis secondary to old stroke.  She is wheelchair-bound.  We will continue to follow. ? ?History of stroke ?Continue statin and antiplatelets treatment ? ?Essential hypertension ?Continue some home medicines. ? ?Hypomagnesemia ?Magnesium 1.3, will give 4 g of magnesium sulfate. ? ?Type 2 diabetes mellitus with polyneuropathy (HCC) ?Hold off oral diabetic medications, start sliding scale insulin. ? ? ? ? ?  ? ?Subjective:  ?Patient still complaining of left leg pain, but swelling much improved. ?Denies any short of breath. ? ?Physical Exam: ?Vitals:  ? 05/15/21 2028 05/15/21 2331 05/16/21 0410 05/16/21 8588  ?BP: 123/62 (!) 152/58 (!) 147/72 (!) 127/53  ?Pulse: 72 79 69 63  ?Resp: 16 16 16 17   ?Temp: 97.7 ?F (36.5 ?C) 98.1 ?F (36.7 ?C) 98 ?F (36.7 ?C) 98.2 ?F (36.8 ?C)  ?TempSrc:    Oral  ?SpO2: 100% 100% 96% 97%  ?Weight:      ?Height:      ? ?General exam: Appears calm and comfortable  ?Respiratory system: Clear to auscultation. Respiratory effort normal. ?Cardiovascular system: S1 & S2 heard, RRR. No JVD, murmurs, rubs, gallops or clicks. No pedal edema. ?Gastrointestinal system: Abdomen is nondistended, soft and nontender. No  organomegaly or masses felt. Normal bowel sounds heard. ?Central nervous system: Alert and oriented x3.  Left hemiplegia. ?Extremities: Symmetric 5 x 5 power. ?Skin: No rashes, lesions or ulcers ?Psychiatry: Judgement and insight appear normal. Mood & affect appropriate.  ? ?Data Reviewed: ? ?Lab results reviewed. ? ?Family Communication: Called son, no response ? ?Disposition: ?Status is: Inpatient ?Remains inpatient appropriate because: Severity of disease and IV antibiotics. ? Planned Discharge Destination: Home with Home Health ? ? ? ?Time spent: 26 minutes ? ?Author: ? , MD ?05/16/2021 10:25 AM ? ?For on call review [email or web address].  ?

## 2021-05-16 NOTE — TOC Progression Note (Addendum)
Transition of Care (TOC) - Progression Note  ? ? ?Patient Details  ?Name: [name] ?MRN: [id number] ?Date of Birth: 1933-05-11 ? ?Transition of Care (TOC) CM/SW Contact  ?Marlowe Sax, RN ?Phone Number: ?05/16/2021, 9:35 AM ? ?Clinical Narrative:   Patient from home where she is wheelchair bound, the son lives with her and assists with her care,  ?TOC will monitor and assist with needs ? ? ? ?  ?  ? ?Expected Discharge Plan and Services ?  ?  ?  ?  ?  ?                ?  ?  ?  ?  ?  ?  ?  ?  ?  ?  ? ? ?Social Determinants of Health (SDOH) Interventions ?  ? ?Readmission Risk Interventions ?   ? View : No data to display.  ?  ?  ?  ? ? ?

## 2021-05-16 NOTE — Assessment & Plan Note (Addendum)
Magnesium 1.3, received 4 g of magnesium sulfate. ?

## 2021-05-16 NOTE — Assessment & Plan Note (Signed)
Received IV potassium, potassium has normalized. ?

## 2021-05-16 NOTE — Assessment & Plan Note (Addendum)
Patient has a right hemiparesis secondary to old stroke.  She is wheelchair-bound.   ?

## 2021-05-17 ENCOUNTER — Inpatient Hospital Stay: Payer: Medicare HMO

## 2021-05-17 DIAGNOSIS — G8194 Hemiplegia, unspecified affecting left nondominant side: Secondary | ICD-10-CM

## 2021-05-17 DIAGNOSIS — L03116 Cellulitis of left lower limb: Secondary | ICD-10-CM | POA: Diagnosis not present

## 2021-05-17 DIAGNOSIS — I739 Peripheral vascular disease, unspecified: Secondary | ICD-10-CM

## 2021-05-17 LAB — HEMOGLOBIN A1C
Hgb A1c MFr Bld: 9.3 % — ABNORMAL HIGH (ref 4.8–5.6)
Hgb A1c MFr Bld: 9.3 % — ABNORMAL HIGH (ref 4.8–5.6)
Mean Plasma Glucose: 220 mg/dL
Mean Plasma Glucose: 220 mg/dL

## 2021-05-17 LAB — GLUCOSE, CAPILLARY
Glucose-Capillary: 77 mg/dL (ref 70–99)
Glucose-Capillary: 140 mg/dL — ABNORMAL HIGH (ref 70–99)
Glucose-Capillary: 130 mg/dL — ABNORMAL HIGH (ref 70–99)

## 2021-05-17 MED ORDER — CEPHALEXIN 500 MG PO CAPS
500.0000 mg | ORAL_CAPSULE | Freq: Three times a day (TID) | ORAL | 0 refills | Status: AC
Start: 2021-05-17 — End: 2021-05-22

## 2021-05-17 MED ORDER — METFORMIN HCL 500 MG PO TABS: 500.0000 mg | ORAL_TABLET | Freq: Two times a day (BID) | ORAL | 0 refills | Status: DC

## 2021-05-17 NOTE — Plan of Care (Signed)

## 2021-05-17 NOTE — Assessment & Plan Note (Signed)
Patient had a persistent left leg pain after cellulitis improved.  Duplex ultrasound showed severe peripheral arterial disease.  Discussed with Dr. Wyn Quaker, prefer to see patient as outpatient.  Given patient active cellulitis, intervention can be delayed. ?

## 2021-05-17 NOTE — Plan of Care (Signed)
Patient discharged home per MD orders at this time.All discharge instructions,education and medications reviewed with the patient and son.Pt expressed understanding and will comply with dc instructions.follow up appointments was also communicated to the patient ?and son.no verbal c/o or any ssx of distress.patient was discharged home with self-care per order.pt was transported home by son in a privately owned vehicle. ?

## 2021-05-17 NOTE — Discharge Summary (Signed)
?Physician Discharge Summary ?  ?Patient: [name] MRN: [id number] DOB: 03/14/1933  ?Admit date:     05/15/2021  ?Discharge date: 05/17/21  ?Discharge Physician: Marrion Coy  ? ?PCP: Gracelyn Nurse, MD  ? ?Recommendations at discharge:  ? ?Follow-up with Dr. Wyn Quaker in 1 week. ?Follow-up with PCP in 1 week. ? ?Discharge Diagnoses: ?Principal Problem: ?  Left leg cellulitis ?Active Problems: ?  Type 2 diabetes mellitus with polyneuropathy (HCC) ?  Hypomagnesemia ?  Essential hypertension ?  History of stroke ?  Left hemiparesis (HCC) ?  Hypokalemia ?  PAD (peripheral artery disease) (HCC) ? ?Resolved Problems: ?  * No resolved hospital problems. * ? ?Hospital Course: ?MARIADEL MRUK is a 88 y.o. female with medical history significant of stroke, type 2 diabetes, essential hypertension, dyslipidemia who presents to the hospital with a left leg redness and pain. ?Duplex ultrasound negative for DVT.  She is treated with cefazolin for cellulitis. ?Patient also had significant leg pain even cellulitis has improved.  Duplex ultrasound showed severe peripheral vascular disease.  Discussed with Dr. Wyn Quaker, he preferred to see patient in office in a week. ? ?Assessment and Plan: ?* Left leg cellulitis ?Patient received cefazolin and oral doxycycline.  Currently not a concern of MRSA.  Condition much improved, will continue 5 days of Keflex. ? ?PAD (peripheral artery disease) (HCC) ?Patient had a persistent left leg pain after cellulitis improved.  Duplex ultrasound showed severe peripheral arterial disease.  Discussed with Dr. Wyn Quaker, prefer to see patient as outpatient.  Given patient active cellulitis, intervention can be delayed. ? ?Hypokalemia ?Received IV potassium, potassium has normalized. ? ?Left hemiparesis (HCC) ?Patient has a right hemiparesis secondary to old stroke.  She is wheelchair-bound.   ? ?History of stroke ?Continue statin and antiplatelets treatment ? ?Essential hypertension ?Continue some home  medicines. ? ?Hypomagnesemia ?Magnesium 1.3, received 4 g of magnesium sulfate. ? ?Type 2 diabetes mellitus with polyneuropathy (HCC) ?A1c 9.3, resume home dose medicine, add metformin.  Follow-up with PCP as outpatient. ? ? ? ? ?  ? ? ?Consultants: None ?Procedures performed: None  ?Disposition: Home health ?Diet recommendation:  ?Discharge Diet Orders (From admission, onward)  ? ?  Start     Ordered  ? 05/17/21 0000  Diet - low sodium heart healthy       ? 05/17/21 1019  ? ?  ?  ? ?  ? ?Cardiac diet ?DISCHARGE MEDICATION: ?Allergies as of 05/17/2021   ?No Known Allergies ?  ? ?  ?Medication List  ?  ? ?STOP taking these medications   ? ?cyanocobalamin 100 MCG tablet ?  ?hydrochlorothiazide 12.5 MG tablet ?Commonly known as: HYDRODIURIL ?  ? ?  ? ?TAKE these medications   ? ?acetaminophen 325 MG tablet ?Commonly known as: TYLENOL ?Take 2 tablets (650 mg total) by mouth every 6 (six) hours as needed for mild pain, moderate pain, fever or headache. ?  ?aspirin 325 MG EC tablet ?Take 1 tablet (325 mg total) by mouth daily. ?  ?atorvastatin 80 MG tablet ?Commonly known as: LIPITOR ?Take 1 tablet (80 mg total) by mouth daily. ?  ?cephALEXin 500 MG capsule ?Commonly known as: KEFLEX ?Take 1 capsule (500 mg total) by mouth 3 (three) times daily for 5 days. ?  ?clopidogrel 75 MG tablet ?Commonly known as: PLAVIX ?Take 1 tablet (75 mg total) by mouth daily. ?  ?glimepiride 4 MG tablet ?Commonly known as: AMARYL ?Take 4 mg by mouth daily with breakfast. ?  ?  lisinopril 5 MG tablet ?Commonly known as: ZESTRIL ?Take 5 mg by mouth daily. ?  ?metFORMIN 500 MG tablet ?Commonly known as: Glucophage ?Take 1 tablet (500 mg total) by mouth 2 (two) times daily with a meal. ?  ? ?  ? ? Follow-up Information   ? ? Gracelyn NurseJohnston, John D, MD Follow up in 1 week(s).   ?Specialty: Internal Medicine ?Why: please call to schedule an appointment ?Contact information: ?[address] ?[place] [address] ?[phone] ? ? ?  ?  ? ? Dew, Marlow BaarsJason  S, MD Follow up in 1 week(s).   ?Specialties: Vascular Surgery, Radiology, Interventional Cardiology ?Why: please call to schedule an appointment ?Contact information: ?[address] ?[place] [address] ?[phone] ? ? ?  ?  ? ?  ?  ? ?  ? ?Discharge Exam: ?Ceasar MonsFiled Weights  ? 05/15/21 1312  ?Weight: 76.2 kg  ? ?General exam: Appears calm and comfortable  ?Respiratory system: Clear to auscultation. Respiratory effort normal. ?Cardiovascular system: S1 & S2 heard, RRR. No JVD, murmurs, rubs, gallops or clicks. No pedal edema. ?Gastrointestinal system: Abdomen is nondistended, soft and nontender. No organomegaly or masses felt. Normal bowel sounds heard. ?Central nervous system: Alert and oriented. No focal neurological deficits. ?Extremities: Left leg swelling and redness much improved ?Skin: No rashes, lesions or ulcers ?Psychiatry: Judgement and insight appear normal. Mood & affect appropriate.  ? ? ?Condition at discharge: good ? ?The results of significant diagnostics from this hospitalization (including imaging, microbiology, ancillary and laboratory) are listed below for reference.  ? ?Imaging Studies: ?US Venous Img Lower Unilateral Left ? ?Result Date: 05/15/2021 ?CLINICAL DATA:  Left lower extremity edema and pain with erythema. EXAM: LEFT LOWER EXTREMITY VENOUS DOPPLER ULTRASOUND TECHNIQUE: Gray-scale sonography with graded compression, as well as color Doppler and duplex ultrasound were performed to evaluate the lower extremity deep venous systems from the level of the common femoral vein and including the common femoral, femoral, profunda femoral, popliteal and calf veins including the posterior tibial, peroneal and gastrocnemius veins when visible. The superficial great saphenous vein was also interrogated. Spectral Doppler was utilized to evaluate flow at rest and with distal augmentation maneuvers in the common femoral, femoral and popliteal veins. COMPARISON:  None. FINDINGS: Contralateral Common  Femoral Vein: Respiratory phasicity is normal and symmetric with the symptomatic side. No evidence of thrombus. Normal compressibility. Common Femoral Vein: No evidence of thrombus. Normal compressibility, respiratory phasicity and response to augmentation. Saphenofemoral Junction: No evidence of thrombus. Normal compressibility and flow on color Doppler imaging. Profunda Femoral Vein: No evidence of thrombus. Normal compressibility and flow on color Doppler imaging. Femoral Vein: No evidence of thrombus. Normal compressibility, respiratory phasicity and response to augmentation. Popliteal Vein: No evidence of thrombus. Normal compressibility, respiratory phasicity and response to augmentation. Calf Veins: No evidence of thrombus. Normal compressibility and flow on color Doppler imaging. Superficial Great Saphenous Vein: No evidence of thrombus. Normal compressibility. Venous Reflux:  None. Other Findings: No evidence of superficial thrombophlebitis or abnormal fluid collection. IMPRESSION: No evidence of left lower extremity deep venous thrombosis. Electronically Signed   By: Irish LackGlenn  Yamagata M.D.   On: 05/15/2021 15:35  ? ?US ARTERIAL LOWER EXTREMITY DUPLEX LEFT (NON-ABI) ? ?Result Date: 05/17/2021 ?CLINICAL DATA:  Ischemia Hypertension Hyperlipidemia Diabetes EXAM: LEFT LOWER EXTREMITY ARTERIAL DUPLEX SCAN TECHNIQUE: Gray-scale sonography as well as color Doppler and duplex ultrasound was performed to evaluate the lower extremity arteries including the common, superficial and profunda femoral arteries, popliteal artery and calf arteries. COMPARISON:  None. FINDINGS: Left lower Extremity Outflow: Monophasic flow seen in the left common femoral, superficial femoral, profunda femoris arteries. Runoff: No flow identified in the popliteal, anterior tibial, posterior tibial arteries. IMPRESSION: Findings consistent with significant lower extremity arterial occlusive disease with no flow seen in the popliteal, anterior  tibial, and posterior tibial arteries. Monophasic flow noted in the left common femoral, superficial femoral, and profunda femoris arteries. Electronically Signed   By: Acquanetta Belling M.D.   On: 05/17/2021 09:32   ? ?M

## 2021-05-17 NOTE — Care Management Important Message (Signed)
Important Message ? ?Patient Details  ?Name: [name] ?MRN: [id number] ?Date of Birth: 08-16-1933 ? ? ?Medicare Important Message Given:  Yes ? ? ? ? ?[name] A Shanard Treto ?05/17/2021, 11:00 AM ?

## 2021-05-17 NOTE — TOC Progression Note (Signed)
Transition of Care (TOC) - Progression Note  ? ? ?Patient Details  ?Name: [name] ?MRN: [id number] ?Date of Birth: [birth year] ? ?Transition of Care (TOC) CM/SW Contact  ?Marlowe Sax, RN ?Phone Number: ?05/17/2021, 3:44 PM ? ?Clinical Narrative:   Attempted to reach the son on the phone no answer ? ? ? ?  ?  ? ?Expected Discharge Plan and Services ?  ?  ?  ?  ?  ?Expected Discharge Date: 05/17/21               ?  ?  ?  ?  ?  ?  ?  ?  ?  ?  ? ? ?Social Determinants of Health (SDOH) Interventions ?  ? ?Readmission Risk Interventions ?   ? View : No data to display.  ?  ?  ?  ? ? ?

## 2021-05-20 LAB — CULTURE, BLOOD (ROUTINE X 2)
Culture: NO GROWTH
Culture: NO GROWTH
Special Requests: ADEQUATE
Special Requests: ADEQUATE

## 2021-05-27 DIAGNOSIS — L97529 Non-pressure chronic ulcer of other part of left foot with unspecified severity: Secondary | ICD-10-CM | POA: Diagnosis not present

## 2021-05-27 DIAGNOSIS — L03116 Cellulitis of left lower limb: Secondary | ICD-10-CM | POA: Diagnosis not present

## 2021-05-27 DIAGNOSIS — Z09 Encounter for follow-up examination after completed treatment for conditions other than malignant neoplasm: Secondary | ICD-10-CM | POA: Diagnosis not present

## 2021-05-31 DIAGNOSIS — I739 Peripheral vascular disease, unspecified: Secondary | ICD-10-CM | POA: Diagnosis not present

## 2021-05-31 DIAGNOSIS — E119 Type 2 diabetes mellitus without complications: Secondary | ICD-10-CM | POA: Diagnosis not present

## 2021-05-31 DIAGNOSIS — L97421 Non-pressure chronic ulcer of left heel and midfoot limited to breakdown of skin: Secondary | ICD-10-CM | POA: Diagnosis not present

## 2021-05-31 DIAGNOSIS — E860 Dehydration: Secondary | ICD-10-CM | POA: Insufficient documentation

## 2021-06-01 ENCOUNTER — Emergency Department: Payer: Medicare HMO

## 2021-06-01 ENCOUNTER — Emergency Department
Admission: EM | Admit: 2021-06-01 | Discharge: 2021-06-01 | Disposition: A | Discharge status: Home / Self Care | Payer: Medicare HMO | Attending: Emergency Medicine | Admitting: Emergency Medicine

## 2021-06-01 DIAGNOSIS — R4182 Altered mental status, unspecified: Secondary | ICD-10-CM | POA: Insufficient documentation

## 2021-06-01 DIAGNOSIS — L8990 Pressure ulcer of unspecified site, unspecified stage: Secondary | ICD-10-CM | POA: Insufficient documentation

## 2021-06-01 DIAGNOSIS — X58XXXA Exposure to other specified factors, initial encounter: Secondary | ICD-10-CM | POA: Insufficient documentation

## 2021-06-01 DIAGNOSIS — S70221A Blister (nonthermal), right hip, initial encounter: Secondary | ICD-10-CM | POA: Diagnosis not present

## 2021-06-01 DIAGNOSIS — S79911A Unspecified injury of right hip, initial encounter: Secondary | ICD-10-CM | POA: Diagnosis present

## 2021-06-01 DIAGNOSIS — I10 Essential (primary) hypertension: Secondary | ICD-10-CM | POA: Insufficient documentation

## 2021-06-01 DIAGNOSIS — R197 Diarrhea, unspecified: Secondary | ICD-10-CM | POA: Diagnosis not present

## 2021-06-01 DIAGNOSIS — N3000 Acute cystitis without hematuria: Secondary | ICD-10-CM

## 2021-06-01 DIAGNOSIS — E1140 Type 2 diabetes mellitus with diabetic neuropathy, unspecified: Secondary | ICD-10-CM | POA: Insufficient documentation

## 2021-06-01 LAB — URINALYSIS, COMPLETE (UACMP) WITH MICROSCOPIC
Bilirubin Urine: NEGATIVE
Glucose, UA: >=500 mg/dL — AB
Ketones, ur: NEGATIVE mg/dL
Leukocytes,Ua: NEGATIVE
Nitrite: POSITIVE — AB
Protein, ur: NEGATIVE mg/dL
RBC / HPF: >50 RBC/hpf — ABNORMAL HIGH (ref 0–5)
Specific Gravity, Urine: 1.015 (ref 1.005–1.030)
pH: 5 (ref 5.0–8.0)

## 2021-06-01 LAB — COMPREHENSIVE METABOLIC PANEL
ALT: 17 U/L (ref 0–44)
AST: 26 U/L (ref 15–41)
Albumin: 3.5 g/dL (ref 3.5–5.0)
Alkaline Phosphatase: 85 U/L (ref 38–126)
Anion gap: 7 (ref 5–15)
BUN: 14 mg/dL (ref 8–23)
CO2: 31 mmol/L (ref 22–32)
Calcium: 9.1 mg/dL (ref 8.9–10.3)
Chloride: 101 mmol/L (ref 98–111)
Creatinine, Ser: 0.68 mg/dL (ref 0.44–1.00)
GFR, Estimated: >60 mL/min (ref >60)
Glucose, Bld: 262 mg/dL — ABNORMAL HIGH (ref 70–99)
Potassium: 3.7 mmol/L (ref 3.5–5.1)
Sodium: 139 mmol/L (ref 135–145)
Total Bilirubin: 0.8 mg/dL (ref 0.3–1.2)
Total Protein: 7.5 g/dL (ref 6.5–8.1)

## 2021-06-01 LAB — CBC
HCT: 37.7 % (ref 36.0–46.0)
Hemoglobin: 11.8 g/dL — ABNORMAL LOW (ref 12.0–15.0)
MCH: 31 pg (ref 26.0–34.0)
MCHC: 31.3 g/dL (ref 30.0–36.0)
MCV: 99 fL (ref 80.0–100.0)
Platelets: 274 10*3/uL (ref 150–400)
RBC: 3.81 MIL/uL — ABNORMAL LOW (ref 3.87–5.11)
RDW: 12.4 % (ref 11.5–15.5)
WBC: 10.3 10*3/uL (ref 4.0–10.5)
nRBC: 0 % (ref 0.0–0.2)

## 2021-06-01 LAB — LACTIC ACID, PLASMA: Lactic Acid, Venous: 1.7 mmol/L (ref 0.5–1.9)

## 2021-06-01 MED ORDER — SODIUM CHLORIDE 0.9 % IV BOLUS
500.0000 mL | Freq: Once | INTRAVENOUS | Status: AC
  Administered 2021-06-01: 500 mL via INTRAVENOUS

## 2021-06-01 MED ORDER — CEPHALEXIN 500 MG PO CAPS: 500.0000 mg | ORAL_CAPSULE | Freq: Three times a day (TID) | ORAL | 0 refills | Status: AC

## 2021-06-01 MED ORDER — FLUCONAZOLE 50 MG PO TABS
150.0000 mg | ORAL_TABLET | Freq: Once | ORAL | Status: AC
  Administered 2021-06-01: 150 mg via ORAL
  Filled 2021-06-01: qty 1

## 2021-06-01 MED ORDER — CEPHALEXIN 500 MG PO CAPS: 500.0000 mg | ORAL_CAPSULE | Freq: Three times a day (TID) | ORAL | 0 refills | Status: DC

## 2021-06-01 MED ORDER — TRAMADOL HCL 50 MG PO TABS
50.0000 mg | ORAL_TABLET | Freq: Once | ORAL | Status: AC
  Administered 2021-06-01: 50 mg via ORAL
  Filled 2021-06-01: qty 1

## 2021-06-01 MED ORDER — TRAMADOL HCL 50 MG PO TABS: 50.0000 mg | ORAL_TABLET | Freq: Two times a day (BID) | ORAL | 0 refills | Status: AC

## 2021-06-01 MED ORDER — CEPHALEXIN 500 MG PO CAPS: 500.0000 mg | ORAL_CAPSULE | Freq: Once | ORAL | Status: DC

## 2021-06-01 MED ORDER — SODIUM CHLORIDE 0.9 % IV SOLN
1.0000 g | Freq: Once | INTRAVENOUS | Status: AC
  Administered 2021-06-01: 1 g via INTRAVENOUS
  Filled 2021-06-01: qty 10

## 2021-06-01 NOTE — ED Provider Notes (Signed)
? ? ?[facility] ?Emergency Department Provider Note ? ? ? ? Event Date/Time  ? First MD Initiated Contact with Patient 06/01/21 1109   ?  (approximate) ? ? ?History  ? ?Altered Mental Status ? ? ?HPI ? ?History provided by adult son who is at bedside. ? ?[name] is a 88 y.o. female with history of hypertension, diabetes, diabetic neuropathy, prior CVA, left hemiparesis, and hypokalemia, presents to the ED for evaluation of AMS. She presents to the ED with 2-3 days of increased confusion, disorientation and episodes of incontinence of bladder and bowel, with associated malodorous urine.  Patient lives alone, but her son and daughter-in-law live right next door, and care for the patient daily including ADLs and meals.  Patient spends majority of her day in her wheelchair as she has left-sided hemiparesis from her previous stroke.  She wears incontinence garments, and the son noted at least 2 episodes of her having diarrhea stools overnight, which is not typical for her.  ? ?  ? ? ?Physical Exam  ? ?Triage Vital Signs: ?ED Triage Vitals  ?Enc Vitals Group  ?   BP 06/01/21 1106 (!) 157/66  ?   Pulse Rate 06/01/21 1104 70  ?   Resp 06/01/21 1104 17  ?   Temp 06/01/21 1106 97.7 ?F (36.5 ?C)  ?   Temp Source 06/01/21 1106 Oral  ?   SpO2 06/01/21 1104 95 %  ?   Weight --   ?   Height 06/01/21 1104 5\' 4"  (1.626 m)  ?   Head Circumference --   ?   Peak Flow --   ?   Pain Score --   ?   Pain Loc --   ?   Pain Edu? --   ?   Excl. in GC? --   ? ? ?Most recent vital signs: ?Vitals:  ? 06/01/21 1730 06/01/21 1800  ?BP: (!) 128/44 (!) 133/52  ?Pulse: 62 62  ?Resp:  18  ?Temp:    ?SpO2: 97% 99%  ? ? ?General Awake, no distress. Alert to person and place.  ?HEENT NCAT. PERRL. EOMI. No rhinorrhea. Mucous membranes are moist. ?CV:  Good peripheral perfusion.  ?RESP:  Normal effort. CTA ?ABD:  No distention. Soft, nontender ?SKIN:  Left heel with dry wound bases noted to recently debrided ulcerations. No  fluctuance, erythema, induration, or purulence noted.  Small intact superficial blister to the right hip without surrounding erythema or induration. ? ? ?ED Results / Procedures / Treatments  ? ?Labs ?(all labs ordered are listed, but only abnormal results are displayed) ?Labs Reviewed  ?COMPREHENSIVE METABOLIC PANEL - Abnormal; Notable for the following components:  ?    Result Value  ? Glucose, Bld 262 (*)   ? All other components within normal limits  ?CBC - Abnormal; Notable for the following components:  ? RBC 3.81 (*)   ? Hemoglobin 11.8 (*)   ? All other components within normal limits  ?URINALYSIS, COMPLETE (UACMP) WITH MICROSCOPIC - Abnormal; Notable for the following components:  ? Color, Urine YELLOW (*)   ? APPearance CLOUDY (*)   ? Glucose, UA >=500 (*)   ? Hgb urine dipstick LARGE (*)   ? Nitrite POSITIVE (*)   ? RBC / HPF >50 (*)   ? Bacteria, UA FEW (*)   ? All other components within normal limits  ?URINE CULTURE  ?LACTIC ACID, PLASMA  ?CBG MONITORING, ED  ? ? ? ?EKG ? ? ?  RADIOLOGY ? ?I personally viewed and evaluated these images as part of my medical decision making, as well as reviewing the written report by the radiologist. ? ?ED Provider Interpretation: no acute findings} ? ?CT HEAD WO CONTRAST ( ) ? ?Result Date: 06/01/2021 ?CLINICAL DATA:  Mental status change. EXAM: CT HEAD WITHOUT CONTRAST TECHNIQUE: Contiguous axial images were obtained from the base of the skull through the vertex without intravenous contrast. RADIATION DOSE REDUCTION: This exam was performed according to the departmental dose-optimization program which includes automated exposure control, adjustment of the mA and/or kV according to patient size and/or use of iterative reconstruction technique. COMPARISON:  September 23, 2019 FINDINGS: Brain: No evidence of acute infarction, hemorrhage, hydrocephalus, extra-axial collection or mass lesion/mass effect. Marked brain parenchymal volume loss and deep white matter  microangiopathy. Vascular: Calcific atherosclerotic disease of the intra cavernous carotid arteries. Skull: Normal. Negative for fracture or focal lesion. Sinuses/Orbits: No acute finding. Other: None. IMPRESSION: 1. No acute intracranial abnormality. 2. Marked brain parenchymal volume loss and deep white matter microangiopathy. Electronically Signed   By: Ted Mcalpine M.D.   On: 06/01/2021 15:57   ? ? ?PROCEDURES: ? ?Critical Care performed: No ? ?Procedures ? ? ?MEDICATIONS ORDERED IN ED: ?Medications  ?cefTRIAXone (ROCEPHIN) 1 g in sodium chloride 0.9 % 100 mL IVPB (0 g Intravenous Stopped 06/01/21 1648)  ?sodium chloride 0.9 % bolus 500 mL (0 mLs Intravenous Stopped 06/01/21 1702)  ?fluconazole (DIFLUCAN) tablet 150 mg (150 mg Oral Given 06/01/21 1805)  ?traMADol (ULTRAM) tablet 50 mg (50 mg Oral Given 06/01/21 1805)  ? ? ? ?IMPRESSION / MDM / ASSESSMENT AND PLAN / ED COURSE  ?I reviewed the triage vital signs and the nursing notes. ?             ?               ? ?Differential diagnosis includes, but is not limited to, alcohol, illicit or prescription medications, or other toxic ingestion; intracranial pathology such as stroke or intracerebral hemorrhage; fever or infectious causes including sepsis; hypoxemia and/or hypercarbia; uremia; trauma; endocrine related disorders such as diabetes, hypoglycemia, and thyroid-related diseases; hypertensive encephalopathy; etc. ? ?The patient is on the cardiac monitor to evaluate for evidence of arrhythmia and/or significant heart rate changes. ? ?Geriatric patient to the ED for evaluation of increased confusion and altered mental status.  Patient presents from home where she lives independently, but her son is next-door for ADLs and personal care.  He reports some increased confusion and also noted some malodorous urine as well as 2 episodes of diarrhea.  Patient was evaluated for her complaints in ED, and found have an overall reassuring work-up.  No signs of acute  sepsis or leukocytosis or anemia on CBC.  Her CMP is reassuring as it shows no acute electrolyte abnormalities.  UA did confirm some nitrite positive urine and some minimal bacteria.  Urine culture is pending at this time.  Patient be treated empirically for a UTI.  No CT head evidence of an acute intracranial process.  Patient's diagnosis is consistent with UTI without evidence of urosepsis.  Patient will be given initial dose of IV antibiotics in the ED.  After reevaluation and fluid bolus, patient has been able to spontaneously void without difficulty, tolerated p.o. intake without difficulty.  Patient has a chronic right heel wound which is being followed by podiatry.  Patient at this time for inpatient management as patient is stable and at baseline.  Patient will be discharged  home with prescriptions for Keflex and Ultram. Patient is to follow up with Podiatry and her PCP as needed or otherwise directed. Patient is given ED precautions to return to the ED for any worsening or new symptoms. ? ? ?FINAL CLINICAL IMPRESSION(S) / ED DIAGNOSES  ? ?Final diagnoses:  ?Acute cystitis without hematuria  ? ? ? ?Rx / DC Orders  ? ?ED Discharge Orders   ? ?      Ordered  ?  cephALEXin (KEFLEX) 500 MG capsule  3 times daily,   Status:  Discontinued       ? 06/01/21 1508  ?  cephALEXin (KEFLEX) 500 MG capsule  3 times daily       ? 06/01/21 1554  ?  traMADol (ULTRAM) 50 MG tablet  2 times daily       ? 06/01/21 1751  ? ?  ?  ? ?  ? ? ? ?Note:  This document was prepared using Dragon voice recognition software and may include unintentional dictation errors. ? ?  ?Lissa HoardMenshew, Wilson Sample V Bacon, PA-C ?06/01/21 1929 ? ?  ?Jene EveryKinner, Robert, MD ?06/02/21 1515 ? ?

## 2021-06-01 NOTE — ED Notes (Signed)
Pt brief changed and purewick placed. 

## 2021-06-01 NOTE — ED Notes (Signed)
Dc ppw provided to pt and son/caregiver. Pt and caregiver deny questions, followup information provided. Rx info provided. Pt assisted off unit in wheelchair with caregiver alert and oriented to baseline. Caregiver provides verbal consent for DC ?

## 2021-06-01 NOTE — Discharge Instructions (Addendum)
Your exam and labs are normal, except for your urine. You are being treated for a UTI. Take the antibiotic as directed. Take the pain medicine as needed. Follow-up with your primary provider for recheck as needed. Follow-up with Podiatry for foot wound care.  ?

## 2021-06-01 NOTE — ED Triage Notes (Signed)
Patient to ER via Pov with son. Son reports the last 2-3 days patient has been more confused, disoriented, had an episode of bowel incontinence, and has foul smelling urine.  ?

## 2021-06-04 ENCOUNTER — Other Ambulatory Visit (INDEPENDENT_AMBULATORY_CARE_PROVIDER_SITE_OTHER): Payer: Self-pay | Admitting: Nurse Practitioner

## 2021-06-04 DIAGNOSIS — L97529 Non-pressure chronic ulcer of other part of left foot with unspecified severity: Secondary | ICD-10-CM

## 2021-06-04 LAB — URINE CULTURE
Culture: >=100000 — AB
Special Requests: NORMAL

## 2021-06-06 ENCOUNTER — Encounter (INDEPENDENT_AMBULATORY_CARE_PROVIDER_SITE_OTHER): Payer: Self-pay | Admitting: Vascular Surgery

## 2021-06-06 ENCOUNTER — Ambulatory Visit (INDEPENDENT_AMBULATORY_CARE_PROVIDER_SITE_OTHER): Payer: Medicare HMO

## 2021-06-06 ENCOUNTER — Ambulatory Visit (INDEPENDENT_AMBULATORY_CARE_PROVIDER_SITE_OTHER): Payer: Medicare HMO | Admitting: Vascular Surgery

## 2021-06-06 VITALS — BP 162/77 | HR 81 | Resp 16 | Ht 64.0 in | Wt 162.0 lb

## 2021-06-06 DIAGNOSIS — I7025 Atherosclerosis of native arteries of other extremities with ulceration: Secondary | ICD-10-CM | POA: Insufficient documentation

## 2021-06-06 DIAGNOSIS — E782 Mixed hyperlipidemia: Secondary | ICD-10-CM

## 2021-06-06 DIAGNOSIS — E1142 Type 2 diabetes mellitus with diabetic polyneuropathy: Secondary | ICD-10-CM | POA: Diagnosis not present

## 2021-06-06 DIAGNOSIS — I10 Essential (primary) hypertension: Secondary | ICD-10-CM

## 2021-06-06 DIAGNOSIS — I6523 Occlusion and stenosis of bilateral carotid arteries: Secondary | ICD-10-CM

## 2021-06-06 NOTE — Progress Notes (Signed)
? ? ? ? ?MRN : [id number] ? ?[name] is a 88 y.o. (1933/09/10) female who presents with chief complaint of check circulation. ? ?History of Present Illness:  ? ?The patient is seen for evaluation of painful lower extremities and diminished pulses associated with ulceration of the left ankle and foot.  The patient notes the ulcer has been present for multiple weeks and has not been improving.  It is very painful and has had some drainage.  No specific history of trauma noted by the patient.  The patient denies fever or chills.  the patient does have diabetes which has been difficult to control. ? ?Patient notes prior to the ulcer developing the extremities were painful particularly with walking. ? ?The patient denies rest pain or dangling of an extremity off the side of the bed during the night for relief. ?No prior interventions or surgeries. ? ?Patient is also c/o leg swelling in association with her pain. The patient first noticed the swelling remotely. The swelling is associated with pain and discoloration. The pain and swelling worsens with prolonged dependency and improves with elevation. The pain is unrelated to activity.  The patient notes that in the morning the legs are significantly improved but they steadily worsened throughout the course of the day. The patient also notes a steady worsening of the discoloration in the ankle and shin area.  ? ?No history of back problems or DJD of the lumbar sacral spine.  ? ?The patient denies amaurosis fugax or recent TIA symptoms. There are no recent neurological changes noted. ?The patient denies history of DVT, PE or superficial thrombophlebitis. ?The patient denies recent episodes of angina or shortness of breath.   ? ?No outpatient medications have been marked as taking for the 06/06/21 encounter (Appointment) with Gilda Crease, Latina Craver, MD.  ? ? ?Past Medical History:  ?Diagnosis Date  ? Carpal tunnel syndrome on both sides   ? Diabetes (HCC)   ? Diabetic  polyneuropathy (HCC)   ? Diverticulitis   ? Hyperlipidemia   ? Hypertension   ? Osteoarthritis   ? Vaginal prolapse   ? ? ?Past Surgical History:  ?Procedure Laterality Date  ? CESAREAN SECTION    ? pessary    ? REPLACEMENT TOTAL KNEE BILATERAL    ? VENTRAL HERNIA REPAIR    ? VENTRAL HERNIA REPAIR N/A 01/13/2016  ? Procedure: HERNIA REPAIR VENTRAL ADULT WITH SMALL BOWEL RESECTION, LYSIS OF ADHESIONS;  Surgeon: Gladis Riffle, MD;  Location: [facility] ORS;  Service: General;  Laterality: N/A;  ? ? ?Social History ?Social History  ? ?Tobacco Use  ? Smoking status: Never  ? Smokeless tobacco: Never  ?Vaping Use  ? Vaping Use: Never used  ?Substance Use Topics  ? Alcohol use: No  ? Drug use: No  ? ? ?Family History ?Family History  ?Problem Relation Age of Onset  ? Breast cancer Mother 29  ? Breast cancer Maternal Aunt   ? ? ?No Known Allergies ? ? ?REVIEW OF SYSTEMS (Negative unless checked) ? ?Constitutional: [] Weight loss  [] Fever  [] Chills ?Cardiac: [] Chest pain   [] Chest pressure   [] Palpitations   [] Shortness of breath when laying flat   [] Shortness of breath with exertion. ?Vascular:  [x] Pain in legs with walking   [] Pain in legs at rest  [] History of DVT   [] Phlebitis   [] Swelling in legs   [] Varicose veins   [x] Non-healing ulcers ?Pulmonary:   [] Uses home oxygen   [] Productive cough   [] Hemoptysis   []   Wheeze  [] COPD   [] Asthma ?Neurologic:  [] Dizziness   [] Seizures   [] History of stroke   [] History of TIA  [] Aphasia   [] Vissual changes   [] Weakness or numbness in arm   [x] Weakness or numbness in leg ?Musculoskeletal:   [] Joint swelling   [] Joint pain   [] Low back pain ?Hematologic:  [] Easy bruising  [] Easy bleeding   [] Hypercoagulable state   [] Anemic ?Gastrointestinal:  [] Diarrhea   [] Vomiting  [] Gastroesophageal reflux/heartburn   [] Difficulty swallowing. ?Genitourinary:  [] Chronic kidney disease   [] Difficult urination  [] Frequent urination   [] Blood in urine ?Skin:  [] Rashes   [] Ulcers  ?Psychological:   [] History of anxiety   []  History of major depression. ? ?Physical Examination ? ?There were no vitals filed for this visit. ?There is no height or weight on file to calculate BMI. ?Gen: WD/WN, NAD ?Head: [place]/AT, No temporalis wasting.  ?Ear/Nose/Throat: Hearing grossly intact, nares w/o erythema or drainage ?Eyes: PER, EOMI, sclera nonicteric.  ?Neck: Supple, no masses.  No bruit or JVD.  ?Pulmonary:  Good air movement, no audible wheezing, no use of accessory muscles.  ?Cardiac: RRR, normal S1, S2, no Murmurs. ?Vascular:  mild trophic changes, multiple small wounds dry at this time ?Vessel Right Left  ?Radial Palpable Palpable  ?PT Not Palpable Not Palpable  ?DP Not Palpable Not Palpable  ?Gastrointestinal: soft, non-distended. No guarding/no peritoneal signs.  ?Musculoskeletal: M/S 5/5 throughout.  No visible deformity.  ?Neurologic: CN 2-12 intact. Pain and light touch intact in extremities.  Symmetrical.  Speech is fluent. Motor exam as listed above. ?Psychiatric: Judgment intact, Mood & affect appropriate for pt's clinical situation. ?Dermatologic: No rashes or ulcers noted.  No changes consistent with cellulitis. ? ? ?CBC ?Lab Results  ?Component Value Date  ? WBC 10.3 06/01/2021  ? HGB 11.8 (L) 06/01/2021  ? HCT 37.7 06/01/2021  ? MCV 99.0 06/01/2021  ? PLT 274 06/01/2021  ? ? ?BMET ?   ?Component Value Date/Time  ? NA 139 06/01/2021 1107  ? NA 143 09/24/2013 0409  ? K 3.7 06/01/2021 1107  ? K 3.6 09/24/2013 0409  ? CL 101 06/01/2021 1107  ? CL 108 (H) 09/24/2013 0409  ? CO2 31 06/01/2021 1107  ? CO2 27 09/24/2013 0409  ? GLUCOSE 262 (H) 06/01/2021 1107  ? GLUCOSE 222 (H) 09/24/2013 0409  ? BUN 14 06/01/2021 1107  ? BUN 7 09/24/2013 0409  ? CREATININE 0.68 06/01/2021 1107  ? CREATININE 0.77 09/24/2013 0409  ? CALCIUM 9.1 06/01/2021 1107  ? CALCIUM 7.7 (L) 09/24/2013 0409  ? GFRNONAA >60 06/01/2021 1107  ? GFRNONAA >60 09/24/2013 0409  ? GFRAA >60 09/26/2019 1952  ? GFRAA >60 09/24/2013 0409  ? ?CrCl cannot be  calculated (Unknown ideal weight.). ? ?COAG ?No results found for: INR, PROTIME ? ?Radiology ?CT HEAD WO CONTRAST ( ) ? ?Result Date: 06/01/2021 ?CLINICAL DATA:  Mental status change. EXAM: CT HEAD WITHOUT CONTRAST TECHNIQUE: Contiguous axial images were obtained from the base of the skull through the vertex without intravenous contrast. RADIATION DOSE REDUCTION: This exam was performed according to the departmental dose-optimization program which includes automated exposure control, adjustment of the mA and/or kV according to patient size and/or use of iterative reconstruction technique. COMPARISON:  September 23, 2019 FINDINGS: Brain: No evidence of acute infarction, hemorrhage, hydrocephalus, extra-axial collection or mass lesion/mass effect. Marked brain parenchymal volume loss and deep white matter microangiopathy. Vascular: Calcific atherosclerotic disease of the intra cavernous carotid arteries. Skull: Normal. Negative for fracture or focal  lesion. Sinuses/Orbits: No acute finding. Other: None. IMPRESSION: 1. No acute intracranial abnormality. 2. Marked brain parenchymal volume loss and deep white matter microangiopathy. Electronically Signed   By: Ted Mcalpineobrinka  Dimitrova M.D.   On: 06/01/2021 15:57  ? ?US Venous Img Lower Unilateral Left ? ?Result Date: 05/15/2021 ?CLINICAL DATA:  Left lower extremity edema and pain with erythema. EXAM: LEFT LOWER EXTREMITY VENOUS DOPPLER ULTRASOUND TECHNIQUE: Gray-scale sonography with graded compression, as well as color Doppler and duplex ultrasound were performed to evaluate the lower extremity deep venous systems from the level of the common femoral vein and including the common femoral, femoral, profunda femoral, popliteal and calf veins including the posterior tibial, peroneal and gastrocnemius veins when visible. The superficial great saphenous vein was also interrogated. Spectral Doppler was utilized to evaluate flow at rest and with distal augmentation maneuvers in the  common femoral, femoral and popliteal veins. COMPARISON:  None. FINDINGS: Contralateral Common Femoral Vein: Respiratory phasicity is normal and symmetric with the symptomatic side. No evidence of thrombus. Nor

## 2021-06-06 NOTE — H&P (View-Only) (Signed)
? ? ? ? ?MRN : [id number] ? ?[name] is a 88 y.o. (1933/09/10) female who presents with chief complaint of check circulation. ? ?History of Present Illness:  ? ?The patient is seen for evaluation of painful lower extremities and diminished pulses associated with ulceration of the left ankle and foot.  The patient notes the ulcer has been present for multiple weeks and has not been improving.  It is very painful and has had some drainage.  No specific history of trauma noted by the patient.  The patient denies fever or chills.  the patient does have diabetes which has been difficult to control. ? ?Patient notes prior to the ulcer developing the extremities were painful particularly with walking. ? ?The patient denies rest pain or dangling of an extremity off the side of the bed during the night for relief. ?No prior interventions or surgeries. ? ?Patient is also c/o leg swelling in association with her pain. The patient first noticed the swelling remotely. The swelling is associated with pain and discoloration. The pain and swelling worsens with prolonged dependency and improves with elevation. The pain is unrelated to activity.  The patient notes that in the morning the legs are significantly improved but they steadily worsened throughout the course of the day. The patient also notes a steady worsening of the discoloration in the ankle and shin area.  ? ?No history of back problems or DJD of the lumbar sacral spine.  ? ?The patient denies amaurosis fugax or recent TIA symptoms. There are no recent neurological changes noted. ?The patient denies history of DVT, PE or superficial thrombophlebitis. ?The patient denies recent episodes of angina or shortness of breath.   ? ?No outpatient medications have been marked as taking for the 06/06/21 encounter (Appointment) with Gilda Crease, Latina Craver, MD.  ? ? ?Past Medical History:  ?Diagnosis Date  ? Carpal tunnel syndrome on both sides   ? Diabetes (HCC)   ? Diabetic  polyneuropathy (HCC)   ? Diverticulitis   ? Hyperlipidemia   ? Hypertension   ? Osteoarthritis   ? Vaginal prolapse   ? ? ?Past Surgical History:  ?Procedure Laterality Date  ? CESAREAN SECTION    ? pessary    ? REPLACEMENT TOTAL KNEE BILATERAL    ? VENTRAL HERNIA REPAIR    ? VENTRAL HERNIA REPAIR N/A 01/13/2016  ? Procedure: HERNIA REPAIR VENTRAL ADULT WITH SMALL BOWEL RESECTION, LYSIS OF ADHESIONS;  Surgeon: Gladis Riffle, MD;  Location: [facility] ORS;  Service: General;  Laterality: N/A;  ? ? ?Social History ?Social History  ? ?Tobacco Use  ? Smoking status: Never  ? Smokeless tobacco: Never  ?Vaping Use  ? Vaping Use: Never used  ?Substance Use Topics  ? Alcohol use: No  ? Drug use: No  ? ? ?Family History ?Family History  ?Problem Relation Age of Onset  ? Breast cancer Mother 29  ? Breast cancer Maternal Aunt   ? ? ?No Known Allergies ? ? ?REVIEW OF SYSTEMS (Negative unless checked) ? ?Constitutional: [] Weight loss  [] Fever  [] Chills ?Cardiac: [] Chest pain   [] Chest pressure   [] Palpitations   [] Shortness of breath when laying flat   [] Shortness of breath with exertion. ?Vascular:  [x] Pain in legs with walking   [] Pain in legs at rest  [] History of DVT   [] Phlebitis   [] Swelling in legs   [] Varicose veins   [x] Non-healing ulcers ?Pulmonary:   [] Uses home oxygen   [] Productive cough   [] Hemoptysis   []   Wheeze  [] COPD   [] Asthma ?Neurologic:  [] Dizziness   [] Seizures   [] History of stroke   [] History of TIA  [] Aphasia   [] Vissual changes   [] Weakness or numbness in arm   [x] Weakness or numbness in leg ?Musculoskeletal:   [] Joint swelling   [] Joint pain   [] Low back pain ?Hematologic:  [] Easy bruising  [] Easy bleeding   [] Hypercoagulable state   [] Anemic ?Gastrointestinal:  [] Diarrhea   [] Vomiting  [] Gastroesophageal reflux/heartburn   [] Difficulty swallowing. ?Genitourinary:  [] Chronic kidney disease   [] Difficult urination  [] Frequent urination   [] Blood in urine ?Skin:  [] Rashes   [] Ulcers  ?Psychological:   [] History of anxiety   []  History of major depression. ? ?Physical Examination ? ?There were no vitals filed for this visit. ?There is no height or weight on file to calculate BMI. ?Gen: WD/WN, NAD ?Head: [place]/AT, No temporalis wasting.  ?Ear/Nose/Throat: Hearing grossly intact, nares w/o erythema or drainage ?Eyes: PER, EOMI, sclera nonicteric.  ?Neck: Supple, no masses.  No bruit or JVD.  ?Pulmonary:  Good air movement, no audible wheezing, no use of accessory muscles.  ?Cardiac: RRR, normal S1, S2, no Murmurs. ?Vascular:  mild trophic changes, multiple small wounds dry at this time ?Vessel Right Left  ?Radial Palpable Palpable  ?PT Not Palpable Not Palpable  ?DP Not Palpable Not Palpable  ?Gastrointestinal: soft, non-distended. No guarding/no peritoneal signs.  ?Musculoskeletal: M/S 5/5 throughout.  No visible deformity.  ?Neurologic: CN 2-12 intact. Pain and light touch intact in extremities.  Symmetrical.  Speech is fluent. Motor exam as listed above. ?Psychiatric: Judgment intact, Mood & affect appropriate for pt's clinical situation. ?Dermatologic: No rashes or ulcers noted.  No changes consistent with cellulitis. ? ? ?CBC ?Lab Results  ?Component Value Date  ? WBC 10.3 06/01/2021  ? HGB 11.8 (L) 06/01/2021  ? HCT 37.7 06/01/2021  ? MCV 99.0 06/01/2021  ? PLT 274 06/01/2021  ? ? ?BMET ?   ?Component Value Date/Time  ? NA 139 06/01/2021 1107  ? NA 143 09/24/2013 0409  ? K 3.7 06/01/2021 1107  ? K 3.6 09/24/2013 0409  ? CL 101 06/01/2021 1107  ? CL 108 (H) 09/24/2013 0409  ? CO2 31 06/01/2021 1107  ? CO2 27 09/24/2013 0409  ? GLUCOSE 262 (H) 06/01/2021 1107  ? GLUCOSE 222 (H) 09/24/2013 0409  ? BUN 14 06/01/2021 1107  ? BUN 7 09/24/2013 0409  ? CREATININE 0.68 06/01/2021 1107  ? CREATININE 0.77 09/24/2013 0409  ? CALCIUM 9.1 06/01/2021 1107  ? CALCIUM 7.7 (L) 09/24/2013 0409  ? GFRNONAA >60 06/01/2021 1107  ? GFRNONAA >60 09/24/2013 0409  ? GFRAA >60 09/26/2019 1952  ? GFRAA >60 09/24/2013 0409  ? ?CrCl cannot be  calculated (Unknown ideal weight.). ? ?COAG ?No results found for: INR, PROTIME ? ?Radiology ?CT HEAD WO CONTRAST ( ) ? ?Result Date: 06/01/2021 ?CLINICAL DATA:  Mental status change. EXAM: CT HEAD WITHOUT CONTRAST TECHNIQUE: Contiguous axial images were obtained from the base of the skull through the vertex without intravenous contrast. RADIATION DOSE REDUCTION: This exam was performed according to the departmental dose-optimization program which includes automated exposure control, adjustment of the mA and/or kV according to patient size and/or use of iterative reconstruction technique. COMPARISON:  September 23, 2019 FINDINGS: Brain: No evidence of acute infarction, hemorrhage, hydrocephalus, extra-axial collection or mass lesion/mass effect. Marked brain parenchymal volume loss and deep white matter microangiopathy. Vascular: Calcific atherosclerotic disease of the intra cavernous carotid arteries. Skull: Normal. Negative for fracture or focal  lesion. Sinuses/Orbits: No acute finding. Other: None. IMPRESSION: 1. No acute intracranial abnormality. 2. Marked brain parenchymal volume loss and deep white matter microangiopathy. Electronically Signed   By: Dobrinka  Dimitrova M.D.   On: 06/01/2021 15:57  ? ?US Venous Img Lower Unilateral Left ? ?Result Date: 05/15/2021 ?CLINICAL DATA:  Left lower extremity edema and pain with erythema. EXAM: LEFT LOWER EXTREMITY VENOUS DOPPLER ULTRASOUND TECHNIQUE: Gray-scale sonography with graded compression, as well as color Doppler and duplex ultrasound were performed to evaluate the lower extremity deep venous systems from the level of the common femoral vein and including the common femoral, femoral, profunda femoral, popliteal and calf veins including the posterior tibial, peroneal and gastrocnemius veins when visible. The superficial great saphenous vein was also interrogated. Spectral Doppler was utilized to evaluate flow at rest and with distal augmentation maneuvers in the  common femoral, femoral and popliteal veins. COMPARISON:  None. FINDINGS: Contralateral Common Femoral Vein: Respiratory phasicity is normal and symmetric with the symptomatic side. No evidence of thrombus. Nor

## 2021-06-07 ENCOUNTER — Telehealth (INDEPENDENT_AMBULATORY_CARE_PROVIDER_SITE_OTHER): Payer: Self-pay

## 2021-06-07 NOTE — Telephone Encounter (Signed)
Spoke with the patient's son and she is scheduled with Dr. Gilda Crease for a left leg angio on 06/11/21 with a 9:15 am arrival time to the [facility]. Pre-procedure instructions were discussed and patient's son stated he understood. ?

## 2021-06-11 ENCOUNTER — Ambulatory Visit
Admission: RE | Admit: 2021-06-11 | Discharge: 2021-06-11 | Disposition: A | Discharge status: Home / Self Care | Payer: Medicare HMO | Attending: Vascular Surgery | Admitting: Vascular Surgery

## 2021-06-11 ENCOUNTER — Other Ambulatory Visit: Payer: Self-pay

## 2021-06-11 ENCOUNTER — Encounter
Admission: RE | Disposition: A | Discharge status: Home / Self Care | Payer: Self-pay | Source: Home / Self Care | Attending: Vascular Surgery

## 2021-06-11 DIAGNOSIS — L97909 Non-pressure chronic ulcer of unspecified part of unspecified lower leg with unspecified severity: Secondary | ICD-10-CM

## 2021-06-11 DIAGNOSIS — I6523 Occlusion and stenosis of bilateral carotid arteries: Secondary | ICD-10-CM | POA: Insufficient documentation

## 2021-06-11 DIAGNOSIS — E1151 Type 2 diabetes mellitus with diabetic peripheral angiopathy without gangrene: Secondary | ICD-10-CM | POA: Insufficient documentation

## 2021-06-11 DIAGNOSIS — E11621 Type 2 diabetes mellitus with foot ulcer: Secondary | ICD-10-CM | POA: Insufficient documentation

## 2021-06-11 DIAGNOSIS — E782 Mixed hyperlipidemia: Secondary | ICD-10-CM | POA: Diagnosis not present

## 2021-06-11 DIAGNOSIS — N812 Incomplete uterovaginal prolapse: Secondary | ICD-10-CM

## 2021-06-11 DIAGNOSIS — I10 Essential (primary) hypertension: Secondary | ICD-10-CM | POA: Diagnosis not present

## 2021-06-11 DIAGNOSIS — L97529 Non-pressure chronic ulcer of other part of left foot with unspecified severity: Secondary | ICD-10-CM | POA: Insufficient documentation

## 2021-06-11 DIAGNOSIS — I70245 Atherosclerosis of native arteries of left leg with ulceration of other part of foot: Secondary | ICD-10-CM | POA: Diagnosis not present

## 2021-06-11 DIAGNOSIS — I70243 Atherosclerosis of native arteries of left leg with ulceration of ankle: Secondary | ICD-10-CM

## 2021-06-11 DIAGNOSIS — I700 Atherosclerosis of aorta: Secondary | ICD-10-CM

## 2021-06-11 DIAGNOSIS — I2510 Atherosclerotic heart disease of native coronary artery without angina pectoris: Secondary | ICD-10-CM | POA: Diagnosis not present

## 2021-06-11 DIAGNOSIS — I70299 Other atherosclerosis of native arteries of extremities, unspecified extremity: Secondary | ICD-10-CM

## 2021-06-11 DIAGNOSIS — E1142 Type 2 diabetes mellitus with diabetic polyneuropathy: Secondary | ICD-10-CM | POA: Insufficient documentation

## 2021-06-11 DIAGNOSIS — L97329 Non-pressure chronic ulcer of left ankle with unspecified severity: Secondary | ICD-10-CM | POA: Diagnosis not present

## 2021-06-11 HISTORY — PX: LOWER EXTREMITY ANGIOGRAPHY: CATH118251

## 2021-06-11 LAB — GLUCOSE, CAPILLARY
Glucose-Capillary: 219 mg/dL — ABNORMAL HIGH (ref 70–99)
Glucose-Capillary: 200 mg/dL — ABNORMAL HIGH (ref 70–99)

## 2021-06-11 SURGERY — LOWER EXTREMITY ANGIOGRAPHY
Anesthesia: Moderate Sedation | Site: Leg Lower | Laterality: Left

## 2021-06-11 MED ORDER — METHYLPREDNISOLONE SODIUM SUCC 125 MG IJ SOLR: 125.0000 mg | Freq: Once | INTRAMUSCULAR | Status: DC | PRN

## 2021-06-11 MED ORDER — MIDAZOLAM HCL 2 MG/2ML IJ SOLN
INTRAMUSCULAR | Status: AC
  Filled 2021-06-11: qty 2

## 2021-06-11 MED ORDER — DIPHENHYDRAMINE HCL 50 MG/ML IJ SOLN: 50.0000 mg | Freq: Once | INTRAMUSCULAR | Status: DC | PRN

## 2021-06-11 MED ORDER — FAMOTIDINE 20 MG PO TABS: 40.0000 mg | ORAL_TABLET | Freq: Once | ORAL | Status: DC | PRN

## 2021-06-11 MED ORDER — CEFAZOLIN SODIUM-DEXTROSE 2-4 GM/100ML-% IV SOLN
INTRAVENOUS | Status: AC
  Administered 2021-06-11: 2 g via INTRAVENOUS
  Filled 2021-06-11: qty 100

## 2021-06-11 MED ORDER — ASPIRIN EC 325 MG PO TBEC: 325.0000 mg | DELAYED_RELEASE_TABLET | ORAL | Status: DC

## 2021-06-11 MED ORDER — HEPARIN SODIUM (PORCINE) 1000 UNIT/ML IJ SOLN
INTRAMUSCULAR | Status: AC
  Filled 2021-06-11: qty 10

## 2021-06-11 MED ORDER — CEFAZOLIN SODIUM-DEXTROSE 2-4 GM/100ML-% IV SOLN: 2.0000 g | INTRAVENOUS | Status: AC

## 2021-06-11 MED ORDER — ONDANSETRON HCL 4 MG/2ML IJ SOLN: 4.0000 mg | Freq: Four times a day (QID) | INTRAMUSCULAR | Status: DC | PRN

## 2021-06-11 MED ORDER — FENTANYL CITRATE (PF) 100 MCG/2ML IJ SOLN
INTRAMUSCULAR | Status: DC | PRN
  Administered 2021-06-11: 25 ug via INTRAVENOUS
  Administered 2021-06-11: 12.5 ug via INTRAVENOUS
  Administered 2021-06-11 (×3): 25 ug via INTRAVENOUS
  Administered 2021-06-11: 12.5 ug via INTRAVENOUS

## 2021-06-11 MED ORDER — MORPHINE SULFATE (PF) 4 MG/ML IV SOLN: 2.0000 mg | INTRAVENOUS | Status: DC | PRN

## 2021-06-11 MED ORDER — SODIUM CHLORIDE 0.9% FLUSH: 3.0000 mL | INTRAVENOUS | Status: DC | PRN

## 2021-06-11 MED ORDER — OXYCODONE HCL 5 MG PO TABS: 5.0000 mg | ORAL_TABLET | ORAL | Status: DC | PRN

## 2021-06-11 MED ORDER — IODIXANOL 320 MG/ML IV SOLN
INTRAVENOUS | Status: DC | PRN
  Administered 2021-06-11: 75 mL via INTRA_ARTERIAL

## 2021-06-11 MED ORDER — ACETAMINOPHEN 325 MG PO TABS: 650.0000 mg | ORAL_TABLET | ORAL | Status: DC | PRN

## 2021-06-11 MED ORDER — HYDRALAZINE HCL 20 MG/ML IJ SOLN: 5.0000 mg | INTRAMUSCULAR | Status: DC | PRN

## 2021-06-11 MED ORDER — SODIUM CHLORIDE 0.9% FLUSH: 3.0000 mL | Freq: Two times a day (BID) | INTRAVENOUS | Status: DC

## 2021-06-11 MED ORDER — FENTANYL CITRATE PF 50 MCG/ML IJ SOSY
PREFILLED_SYRINGE | INTRAMUSCULAR | Status: AC
  Filled 2021-06-11: qty 1

## 2021-06-11 MED ORDER — SODIUM CHLORIDE 0.9 % IV SOLN: 250.0000 mL | INTRAVENOUS | Status: DC | PRN

## 2021-06-11 MED ORDER — LABETALOL HCL 5 MG/ML IV SOLN: 10.0000 mg | INTRAVENOUS | Status: DC | PRN

## 2021-06-11 MED ORDER — SODIUM CHLORIDE 0.9 % IV SOLN: INTRAVENOUS | Status: DC

## 2021-06-11 MED ORDER — HYDROMORPHONE HCL 1 MG/ML IJ SOLN: 1.0000 mg | Freq: Once | INTRAMUSCULAR | Status: DC | PRN

## 2021-06-11 MED ORDER — MIDAZOLAM HCL 2 MG/2ML IJ SOLN
INTRAMUSCULAR | Status: DC | PRN
  Administered 2021-06-11 (×2): .5 mg via INTRAVENOUS
  Administered 2021-06-11: 1 mg via INTRAVENOUS
  Administered 2021-06-11 (×4): .5 mg via INTRAVENOUS

## 2021-06-11 MED ORDER — HEPARIN SODIUM (PORCINE) 1000 UNIT/ML IJ SOLN
INTRAMUSCULAR | Status: DC | PRN
  Administered 2021-06-11: 5000 [IU] via INTRAVENOUS

## 2021-06-11 MED ORDER — MIDAZOLAM HCL 2 MG/ML PO SYRP: 8.0000 mg | ORAL_SOLUTION | Freq: Once | ORAL | Status: DC | PRN

## 2021-06-11 SURGICAL SUPPLY — 31 items
BALLN ARMADA 2X100X150 (BALLOONS) ×2
BALLN LUTONIX 018 4X150X130 (BALLOONS) ×2
BALLN LUTONIX 018 4X40X130 (BALLOONS) ×2
BALLN LUTONIX 018 5X150X130 (BALLOONS) ×2
BALLN LUTONIX 018 5X60X130 (BALLOONS) ×2
BALLN ULTRVRSE 018 2.5X150X150 (BALLOONS) ×2
BALLN ULTRVRSE 2X100X150 (BALLOONS) ×2
BALLOON ARMADA 2X100X150 (BALLOONS) IMPLANT
BALLOON LUTONIX 018 4X150X130 (BALLOONS) IMPLANT
BALLOON LUTONIX 018 4X40X130 (BALLOONS) IMPLANT
BALLOON LUTONIX 018 5X150X130 (BALLOONS) IMPLANT
BALLOON LUTONIX 018 5X60X130 (BALLOONS) IMPLANT
BALLOON ULTRVRSE 2X100X150 (BALLOONS) IMPLANT
BALLOON ULTRVS 018 2.5X150X150 (BALLOONS) IMPLANT
CANNULA 5F STIFF (CANNULA) ×1 IMPLANT
CATH ANGIO 5F PIGTAIL 65CM (CATHETERS) ×1 IMPLANT
CATH BEACON 5 .038 100 VERT TP (CATHETERS) ×1 IMPLANT
COVER PROBE U/S 5X48 (MISCELLANEOUS) ×1 IMPLANT
DEVICE STARCLOSE SE CLOSURE (Vascular Products) ×1 IMPLANT
GLIDEWIRE ADV .035X260CM (WIRE) ×1 IMPLANT
GUIDEWIRE PFTE-COATED .018X300 (WIRE) ×1 IMPLANT
KIT ENCORE 26 ADVANTAGE (KITS) ×1 IMPLANT
PACK ANGIOGRAPHY (CUSTOM PROCEDURE TRAY) ×2 IMPLANT
SHEATH BRITE TIP 5FRX11 (SHEATH) ×1 IMPLANT
SHEATH RAABE 6FR (SHEATH) ×1 IMPLANT
STENT LIFESTENT 5F 5X150X135 (Permanent Stent) ×1 IMPLANT
STENT LIFESTENT 5F 6X60X135 (Permanent Stent) ×1 IMPLANT
SYR MEDRAD MARK 7 150ML (SYRINGE) ×1 IMPLANT
TUBING CONTRAST HIGH PRESS 72 (TUBING) ×1 IMPLANT
WIRE G V18X300CM (WIRE) ×1 IMPLANT
WIRE GUIDERIGHT .035X150 (WIRE) ×1 IMPLANT

## 2021-06-11 NOTE — OR Nursing (Signed)
Dry black escar non draining sores bottum of left heal. Escar sore above left inner ankle, redness noted left ankle/foot, escar sore left outer ankle. Left leg contracted.  ?

## 2021-06-11 NOTE — Interval H&P Note (Signed)
History and Physical Interval Note: ? ?06/11/2021 ?10:21 AM ? ?[name]  has presented today for surgery, with the diagnosis of LLE Angio  BARD   ASO w ulceration.  The various methods of treatment have been discussed with the patient and family. After consideration of risks, benefits and other options for treatment, the patient has consented to  Procedure(s): ?Lower Extremity Angiography (Left) as a surgical intervention.  The patient's history has been reviewed, patient examined, no change in status, stable for surgery.  I have reviewed the patient's chart and labs.  Questions were answered to the patient's satisfaction.   ? ? ?Levora Dredge ? ? ?

## 2021-06-11 NOTE — Op Note (Signed)
[facility] ? Percutaneous Study/Intervention Procedural Note ? ? ?Date of Surgery: 06/11/2021 ? ?Surgeon:  Katha Cabal, MD. ? 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 ?Post-operative diagnosis:  Same ? ?Procedure(s) Performed: ?            1.  Introduction catheter into left lower extremity 3rd order catheter placement  ?            2.    Contrast injection left lower extremity for distal runoff ?            3.  Percutaneous transluminal angioplasty and stent placement left superficial femoral artery and popliteal ?            4.  Percutaneous transluminal angioplasty of the peroneal artery to 2.5 mm.   ?             5.  Star close closure right common femoral arteriotomy ? ?Anesthesia: Conscious sedation was administered under my direct supervision by the interventional radiology RN. IV Versed plus fentanyl were utilized. Continuous ECG, pulse oximetry and blood pressure was monitored throughout the entire procedure.  Conscious sedation was for a total of 89 minutes. ? ?Sheath: 6 [place] Rabie retrograde right common femoral artery ? ?Contrast: 75 cc ? ?Fluoroscopy Time: 13.9 minutes ? ?Indications:  [name] presents with atherosclerotic occlusive disease associated with ulceration of the left foot and ankle.  This places her at high risk for limb loss.  The risks and benefits are reviewed all questions answered patient agrees to proceed. ? ?Procedure:  [name] is a 88 y.o. y.o. female who was identified and appropriate procedural time out was performed.  The patient was then placed supine on the table and prepped and draped in the usual sterile fashion.   ? ?Ultrasound was placed in the sterile sleeve and the right groin was evaluated the right common femoral artery was echolucent and pulsatile indicating patency.  Image was recorded for the permanent record and under real-time visualization  a microneedle was inserted into the common femoral artery microwire followed by a micro-sheath.  A J-wire was then advanced through the micro-sheath and a  5 [place] sheath was then inserted over a J-wire. J-wire was then advanced and a 5 French pigtail catheter was positioned at the level of T12. AP projection of the aorta was then obtained. Pigtail catheter was repositioned to above the bifurcation and a RAO view of the pelvis was obtained.  Subsequently a pigtail catheter with the advantage Glidewire was used to cross the aortic bifurcation the catheter wire were advanced down into the left distal external iliac artery. Oblique view of the femoral bifurcation was then obtained and subsequently the wire was reintroduced and the pigtail catheter negotiated into the SFA representing third order catheter placement. Distal runoff was then performed. ? ?Diagnostic interpretation: ?The abdominal aorta is opacified with a bolus injection contrast.  Mild atherosclerotic changes are identified but there are no hemodynamically significant lesions.  Single renal arteries are noted with normal nephrograms.  No evidence of hemodynamically significant renal artery stenosis.  The aortic bifurcation is widely patent.  The bilateral common internal and external iliac arteries are widely patent. ? ?The left common femoral profunda femoris are widely patent with increasing atherosclerotic changes but no hemodynamically significant lesions.  There is increasing calcific disease within the superficial femoral artery with a 80% stenosis at the distal SFA.  Extending into the  popliteal there is diffuse 65 to 80% stenosis until there is an abrupt occlusion of the mid popliteal behind the knee.  The occlusion is approximately 3 cm in length.  Distal to this there again is 60 to 70% diffuse stenosis of the distal popliteal.  The anterior tibial artery occludes essentially at its takeoff and remains occluded throughout its course.   Tibioperoneal trunk is calcific but patent without hemodynamically significant lesion.  Posterior tibial is occluded from its origin all the way down to the ankle.  Peroneal demonstrates an occlusion in its mid portion which extends down to the distal portion.  It collateralizes to the plantar vessels and the dorsalis pedis distally. ? ?5000 units of heparin was then given and allowed to circulate and a 6 [place] Rabie sheath was advanced up and over the bifurcation and positioned in the proximal to mid superficial femoral artery ? ?KMP  catheter and stiff angle Glidewire were then negotiated down into the distal popliteal.  Distal runoff was then reimaged by hand injection through the catheter.  A V18 wire was then reintroduced and a 4 mm x 150 mm Lutonix drug-eluting balloon was used to angioplasty the distal superficial femoral and popliteal arteries. Inflation were to 10 atmospheres for 2 minutes.  Follow-up imaging demonstrated greater than 50% residual stenosis throughout the entire lesion.  Subsequently, a 5 mm x 150 mm life stent was deployed with its distal margin just proximal to the origin of the anterior tibial extending proximally.  A second 6 mm x 60 mm life stent was deployed.  The stents were then postdilated with two 5 mm Lutonix drug-eluting balloon was utilized inflating to 10 atm for approximately 1 minute each. Follow-up imaging demonstrated less than 10% residual stenosis and I therefore addressed the peroneal lesion. ? ?The wire was then negotiated through the peroneal occlusion at this point I had exchanged the V18 wire for a 0.018 advantage wire.  Approximately a 2.5 mm balloon was used to treat and then distally a 2.0 mm balloon was utilized.  These balloons overlapped.  Both inflations were to 12 to 14 atm for 1 minute each.  Follow-up imaging demonstrated wide patency of the peroneal with preservation of the collaterals there is less than 10% residual stenosis. ? ?After review of these  images the sheath is pulled into the right external iliac oblique of the common femoral is obtained and a Star close device deployed. There no immediate complications. ? ? ?Findings:   ?The abdominal aorta is opacified with a bolus injection contrast.  Mild atherosclerotic changes are identified but there are no hemodynamically significant lesions.  Single renal arteries are noted with normal nephrograms.  No evidence of hemodynamically significant renal artery stenosis.  The aortic bifurcation is widely patent.  The bilateral common internal and external iliac arteries are widely patent. ? ?The left common femoral profunda femoris are widely patent with increasing atherosclerotic changes but no hemodynamically significant lesions.  There is increasing calcific disease within the superficial femoral artery with a 80% stenosis at the distal SFA.  Extending into the popliteal there is diffuse 65 to 80% stenosis until there is an abrupt occlusion of the mid popliteal behind the knee.  The occlusion is approximately 3 cm in length.  Distal to this there again is 60 to 70% diffuse stenosis of the distal popliteal.  The anterior tibial artery occludes essentially at its takeoff and remains occluded throughout its course.  Tibioperoneal trunk is calcific but patent without hemodynamically significant  lesion.  Posterior tibial is occluded from its origin all the way down to the ankle.  Peroneal demonstrates an occlusion in its mid portion which extends down to the distal portion.  It collateralizes to the plantar vessels and the dorsalis pedis distally. ? ?Following stent placement the distal SFA and popliteal artery is now widely [place] B[place] patent with less than 10% residual stenosis ? ?Following angioplasty peroneal artery there is in-line flow and looks quite nice with less than 10% residual stenosis and preservation of the distal runoff.  ? ? ?Summary: Successful recanalization left lower extremity for limb salvage ? ?                      ? ?Disposition: Patient was taken to the recovery room in stable condition having tolerated the procedure well. ? ?Katha Cabal ?06/11/2021,12:15 PM  ?

## 2021-06-12 ENCOUNTER — Encounter: Payer: Self-pay | Admitting: Vascular Surgery

## 2021-07-10 ENCOUNTER — Other Ambulatory Visit (INDEPENDENT_AMBULATORY_CARE_PROVIDER_SITE_OTHER): Payer: Self-pay | Admitting: Vascular Surgery

## 2021-07-10 DIAGNOSIS — Z95820 Peripheral vascular angioplasty status with implants and grafts: Secondary | ICD-10-CM

## 2021-07-10 DIAGNOSIS — I70249 Atherosclerosis of native arteries of left leg with ulceration of unspecified site: Secondary | ICD-10-CM

## 2021-07-10 NOTE — Progress Notes (Signed)
MRN : [id number]  [name] is a 88 y.o. (1933-04-01) female who presents with chief complaint of check circulation.  History of Present Illness:   The patient returns to the office for followup and review status post angiogram with intervention on 06/11/2021.   Procedure:  Percutaneous transluminal angioplasty and stent placement left superficial femoral artery and popliteal  2.   Percutaneous transluminal angioplasty of the peroneal artery to 2.5 mm.    The patient notes improvement in the lower extremity symptoms. No interval shortening of the patient's claudication distance or rest pain symptoms. No new ulcers or wounds have occurred since the last visit.  There have been no significant changes to the patient's overall health care.  No documented history of amaurosis fugax or recent TIA symptoms. There are no recent neurological changes noted. No documented history of DVT, PE or superficial thrombophlebitis. The patient denies recent episodes of angina or shortness of breath.   ABI's Rt=1.11 and Lt=1.09   Duplex US of the left lower extremity arterial system shows the SFA and popliteal is patent   No outpatient medications have been marked as taking for the 07/11/21 encounter (Appointment) with Gilda Crease, Latina Craver, MD.    Past Medical History:  Diagnosis Date   Carpal tunnel syndrome on both sides    Diabetes (HCC)    Diabetic polyneuropathy (HCC)    Diverticulitis    Hyperlipidemia    Hypertension    Osteoarthritis    Vaginal prolapse     Past Surgical History:  Procedure Laterality Date   CESAREAN SECTION     LOWER EXTREMITY ANGIOGRAPHY Left 06/11/2021   Procedure: Lower Extremity Angiography;  Surgeon: Renford Dills, MD;  Location: [facility];  Service: Cardiovascular;  Laterality: Left;   pessary     REPLACEMENT TOTAL KNEE BILATERAL     VENTRAL HERNIA REPAIR     VENTRAL HERNIA REPAIR N/A 01/13/2016   Procedure: HERNIA REPAIR VENTRAL ADULT  WITH SMALL BOWEL RESECTION, LYSIS OF ADHESIONS;  Surgeon: Gladis Riffle, MD;  Location: [facility];  Service: General;  Laterality: N/A;    Social History Social History   Tobacco Use   Smoking status: Never   Smokeless tobacco: Never  Vaping Use   Vaping Use: Never used  Substance Use Topics   Alcohol use: No   Drug use: No    Family History Family History  Problem Relation Age of Onset   Breast cancer Mother 6   Breast cancer Maternal Aunt     Allergies  Allergen Reactions   Metformin And Related Nausea And Vomiting     REVIEW OF SYSTEMS (Negative unless checked)  Constitutional: [] Weight loss  [] Fever  [] Chills Cardiac: [] Chest pain   [] Chest pressure   [] Palpitations   [] Shortness of breath when laying flat   [] Shortness of breath with exertion. Vascular:  [x] Pain in legs with walking   [] Pain in legs at rest  [] History of DVT   [] Phlebitis   [] Swelling in legs   [] Varicose veins   [] Non-healing ulcers Pulmonary:   [] Uses home oxygen   [] Productive cough   [] Hemoptysis   [] Wheeze  [] COPD   [] Asthma Neurologic:  [] Dizziness   [] Seizures   [] History of stroke   [] History of TIA  [] Aphasia   [] Vissual changes   [] Weakness or numbness in arm   [] Weakness or numbness in leg Musculoskeletal:   [] Joint swelling   [] Joint pain   [] Low back pain Hematologic:  [] Easy  bruising  [] Easy bleeding   [] Hypercoagulable state   [] Anemic Gastrointestinal:  [] Diarrhea   [] Vomiting  [] Gastroesophageal reflux/heartburn   [] Difficulty swallowing. Genitourinary:  [] Chronic kidney disease   [] Difficult urination  [] Frequent urination   [] Blood in urine Skin:  [] Rashes   [] Ulcers  Psychological:  [] History of anxiety   []  History of major depression.  Physical Examination  There were no vitals filed for this visit. There is no height or weight on file to calculate BMI. Gen: WD/WN, NAD Head: [place]/AT, No temporalis wasting.  Ear/Nose/Throat: Hearing grossly intact, nares w/o erythema or  drainage Eyes: PER, EOMI, sclera nonicteric.  Neck: Supple, no masses.  No bruit or JVD.  Pulmonary:  Good air movement, no audible wheezing, no use of accessory muscles.  Cardiac: RRR, normal S1, S2, no Murmurs. Vascular:  mild trophic changes, + open wounds at the ankle with necrotic tissue Vessel Right Left  Radial Palpable Palpable  PT Not Palpable Not Palpable  DP Not Palpable Not Palpable  Gastrointestinal: soft, non-distended. No guarding/no peritoneal signs.  Musculoskeletal: M/S 5/5 throughout.  No visible deformity.  Neurologic: CN 2-12 intact. Pain and light touch intact in extremities.  Symmetrical.  Speech is fluent. Motor exam as listed above. Psychiatric: Judgment intact, Mood & affect appropriate for pt's clinical situation. Dermatologic: No rashes or ulcers noted.  No changes consistent with cellulitis.   CBC Lab Results  Component Value Date   WBC 10.3 06/01/2021   HGB 11.8 (L) 06/01/2021   HCT 37.7 06/01/2021   MCV 99.0 06/01/2021   PLT 274 06/01/2021    BMET    Component Value Date/Time   NA 139 06/01/2021 1107   NA 143 09/24/2013 0409   K 3.7 06/01/2021 1107   K 3.6 09/24/2013 0409   CL 101 06/01/2021 1107   CL 108 (H) 09/24/2013 0409   CO2 31 06/01/2021 1107   CO2 27 09/24/2013 0409   GLUCOSE 262 (H) 06/01/2021 1107   GLUCOSE 222 (H) 09/24/2013 0409   BUN 14 06/01/2021 1107   BUN 7 09/24/2013 0409   CREATININE 0.68 06/01/2021 1107   CREATININE 0.77 09/24/2013 0409   CALCIUM 9.1 06/01/2021 1107   CALCIUM 7.7 (L) 09/24/2013 0409   GFRNONAA >60 06/01/2021 1107   GFRNONAA >60 09/24/2013 0409   GFRAA >60 09/26/2019 1952   GFRAA >60 09/24/2013 0409   CrCl cannot be calculated (Patient's most recent lab result is older than the maximum 21 days allowed.).  COAG No results found for: INR, PROTIME  Radiology PERIPHERAL VASCULAR CATHETERIZATION  Result Date: 06/11/2021 See surgical note for result.    Assessment/Plan 1. Atherosclerosis of  native arteries of the extremities with ulceration (HCC)  Recommend:  The patient has evidence of atherosclerosis of the lower extremities with claudication.  The wound seems to have worsened.  Culture is obtained today and I have placed a consult for the wound center.  Noninvasive studies show the SFA/pop intervention is patent.  No invasive studies, angiography or surgery at this time The patient should continue walking and begin a more formal exercise program.  The patient should continue antiplatelet therapy and aggressive treatment of the lipid abnormalities  No changes in the patient's medications at this time  Continued surveillance is indicated as atherosclerosis is likely to progress with time.    The patient will continue follow up with noninvasive studies as ordered.   - Ambulatory referral to Wound Clinic  2. Bilateral carotid artery stenosis Recommend:  Given the patient's asymptomatic  subcritical stenosis no further invasive testing or surgery at this time.  Duplex ultrasound shows 1-39% stenosis bilaterally.  Continue antiplatelet therapy as prescribed Continue management of CAD, HTN and Hyperlipidemia Healthy heart diet,  encouraged exercise at least 4 times per week Follow up in 12 months with duplex ultrasound and physical exam    3. Essential hypertension Continue antihypertensive medications as already ordered, these medications have been reviewed and there are no changes at this time.   4. Type 2 diabetes mellitus with polyneuropathy (HCC) Continue hypoglycemic medications as already ordered, these medications have been reviewed and there are no changes at this time.  Hgb A1C to be monitored as already arranged by primary service   5. Mixed hyperlipidemia Continue statin as ordered and reviewed, no changes at this time      Levora Dredge, MD  07/10/2021 7:46 AM

## 2021-07-11 ENCOUNTER — Ambulatory Visit (INDEPENDENT_AMBULATORY_CARE_PROVIDER_SITE_OTHER): Payer: Medicare HMO

## 2021-07-11 ENCOUNTER — Ambulatory Visit (INDEPENDENT_AMBULATORY_CARE_PROVIDER_SITE_OTHER): Payer: Medicare HMO | Admitting: Vascular Surgery

## 2021-07-11 ENCOUNTER — Encounter (INDEPENDENT_AMBULATORY_CARE_PROVIDER_SITE_OTHER): Payer: Self-pay | Admitting: Vascular Surgery

## 2021-07-11 ENCOUNTER — Other Ambulatory Visit (INDEPENDENT_AMBULATORY_CARE_PROVIDER_SITE_OTHER): Payer: Self-pay | Admitting: Vascular Surgery

## 2021-07-11 VITALS — BP 143/73 | HR 70 | Resp 17

## 2021-07-11 DIAGNOSIS — I7025 Atherosclerosis of native arteries of other extremities with ulceration: Secondary | ICD-10-CM | POA: Diagnosis not present

## 2021-07-11 DIAGNOSIS — Z95820 Peripheral vascular angioplasty status with implants and grafts: Secondary | ICD-10-CM

## 2021-07-11 DIAGNOSIS — I70249 Atherosclerosis of native arteries of left leg with ulceration of unspecified site: Secondary | ICD-10-CM

## 2021-07-11 DIAGNOSIS — E782 Mixed hyperlipidemia: Secondary | ICD-10-CM

## 2021-07-11 DIAGNOSIS — I6523 Occlusion and stenosis of bilateral carotid arteries: Secondary | ICD-10-CM

## 2021-07-11 DIAGNOSIS — I10 Essential (primary) hypertension: Secondary | ICD-10-CM

## 2021-07-11 DIAGNOSIS — E1142 Type 2 diabetes mellitus with diabetic polyneuropathy: Secondary | ICD-10-CM

## 2021-07-14 ENCOUNTER — Encounter (INDEPENDENT_AMBULATORY_CARE_PROVIDER_SITE_OTHER): Payer: Self-pay | Admitting: Vascular Surgery

## 2021-07-14 LAB — AEROBIC CULTURE

## 2021-07-23 ENCOUNTER — Ambulatory Visit (INDEPENDENT_AMBULATORY_CARE_PROVIDER_SITE_OTHER): Payer: Medicare HMO | Admitting: Nurse Practitioner

## 2021-08-01 ENCOUNTER — Ambulatory Visit (INDEPENDENT_AMBULATORY_CARE_PROVIDER_SITE_OTHER): Payer: Medicare HMO | Admitting: Vascular Surgery

## 2021-08-01 ENCOUNTER — Encounter: Payer: Medicare HMO | Attending: Physician Assistant | Admitting: Physician Assistant

## 2021-08-01 DIAGNOSIS — F0154 Vascular dementia, unspecified severity, with anxiety: Secondary | ICD-10-CM | POA: Insufficient documentation

## 2021-08-01 DIAGNOSIS — L97822 Non-pressure chronic ulcer of other part of left lower leg with fat layer exposed: Secondary | ICD-10-CM | POA: Insufficient documentation

## 2021-08-01 DIAGNOSIS — L97522 Non-pressure chronic ulcer of other part of left foot with fat layer exposed: Secondary | ICD-10-CM | POA: Insufficient documentation

## 2021-08-01 DIAGNOSIS — E11622 Type 2 diabetes mellitus with other skin ulcer: Secondary | ICD-10-CM | POA: Diagnosis not present

## 2021-08-01 DIAGNOSIS — E11621 Type 2 diabetes mellitus with foot ulcer: Secondary | ICD-10-CM | POA: Insufficient documentation

## 2021-08-01 DIAGNOSIS — L97422 Non-pressure chronic ulcer of left heel and midfoot with fat layer exposed: Secondary | ICD-10-CM | POA: Insufficient documentation

## 2021-08-01 NOTE — Progress Notes (Signed)
[name], [name] ([id number]) Visit Report for 08/01/2021 Abuse Risk Screen Details Patient Name: [name], [name]. Date of Service: 08/01/2021 10:00 AM Medical Record Number: [id number] Patient Account Number: [id number] Date of Birth/Sex: 09-May-1933 (87 y.o. F) Treating RN: Angelina Pih Primary Care Bethann Qualley: [name] Other Clinician: Referring Vishwa Dais: Levora Dredge Treating Leyani Gargus/Extender: Rowan Blase in Treatment: 0 Abuse Risk Screen Items Answer ABUSE RISK SCREEN: Has anyone close to you tried to hurt or harm you recentlyo No Do you feel uncomfortable with anyone in your familyo No Has anyone forced you do things that you didnot want to doo No Electronic Signature(s) Signed: 08/01/2021 4:30:31 PM By: Angelina Pih Entered By: Angelina Pih on 08/01/2021 10:42:32 [name] ([id number]) -------------------------------------------------------------------------------- Activities of Daily Living Details Patient Name: [name]. Date of Service: 08/01/2021 10:00 AM Medical Record Number: [id number] Patient Account Number: [id number] Date of Birth/Sex: 1933/03/16 (87 y.o. F) Treating RN: Angelina Pih Primary Care Orella Cushman: [name] Other Clinician: Referring Vista Sawatzky: Levora Dredge Treating Laurina Fischl/Extender: Rowan Blase in Treatment: 0 Activities of Daily Living Items Answer Activities of Daily Living (Please select one for each item) Drive Automobile Not Able Take Medications Need Assistance Use Telephone Need Assistance Care for Appearance Need Assistance Use Toilet Need Assistance Bath / Shower Need Assistance Dress Self Need Assistance Feed Self Need Assistance Walk Need Assistance Get In / Out Bed Need Assistance Housework Need Assistance Prepare Meals Need Assistance Handle Money Need Assistance Shop for Self Need Assistance Electronic Signature(s) Signed: 08/01/2021 4:30:31 PM By: Angelina Pih Entered  By: Angelina Pih on 08/01/2021 10:42:54 [name] ([id number]) -------------------------------------------------------------------------------- Education Screening Details Patient Name: [name] Date of Service: 08/01/2021 10:00 AM Medical Record Number: [id number] Patient Account Number: [id number] Date of Birth/Sex: 17-Nov-1933 (87 y.o. F) Treating RN: Angelina Pih Primary Care Chemika Nightengale: [name] Other Clinician: Referring Jovanna Hodges: Levora Dredge Treating Lanice Folden/Extender: Rowan Blase in Treatment: 0 Learning Preferences/Education Level/Primary Language Learning Preference: Explanation, Demonstration, Video, Communication Board, Printed Material Preferred Language: English Cognitive Barrier Language Barrier: No Translator Needed: No Memory Deficit: Yes forgetful Emotional Barrier: No Cultural/Religious Beliefs Affecting Medical Care: No Physical Barrier Impaired Vision: Yes Glasses Impaired Hearing: No Decreased Hand dexterity: Yes Limitations: left Knowledge/Comprehension Knowledge Level: Medium Comprehension Level: Medium Ability to understand written instructions: Medium Ability to understand verbal instructions: Medium Motivation Anxiety Level: Calm Cooperation: Cooperative Education Importance: Acknowledges Need Interest in Health Problems: Asks Questions Perception: Coherent Willingness to Engage in Self-Management High Activities: Readiness to Engage in Self-Management High Activities: Electronic Signature(s) Signed: 08/01/2021 4:30:31 PM By: Angelina Pih Entered By: Angelina Pih on 08/01/2021 10:43:23 [name] ([id number]) -------------------------------------------------------------------------------- Fall Risk Assessment Details Patient Name: [name] Date of Service: 08/01/2021 10:00 AM Medical Record Number: [id number] Patient Account Number: [id number] Date of Birth/Sex: 13-Aug-1933 (87 y.o.  F) Treating RN: Angelina Pih Primary Care Aneeka Bowden: [name] Other Clinician: Referring Samson Ralph: Levora Dredge Treating Selby Slovacek/Extender: Rowan Blase in Treatment: 0 Fall Risk Assessment Items Have you had 2 or more falls in the last 12 monthso 0 Yes Have you had any fall that resulted in injury in the last 12 monthso 0 No FALLS RISK SCREEN History of falling - immediate or within 3 months 25 Yes Secondary diagnosis (Do you have 2 or more medical diagnoseso) 0 No Ambulatory aid None/bed rest/wheelchair/nurse 0 Yes Crutches/cane/walker 0 No Furniture 0 No Intravenous therapy Access/Saline/Heparin Lock 0 No Gait/Transferring Normal/ bed rest/ wheelchair 0 Yes Weak (short steps with or without shuffle,  stooped but able to lift head while walking, may 0 No seek support from furniture) Impaired (short steps with shuffle, may have difficulty arising from chair, head down, impaired 20 Yes balance) Mental Status Oriented to own ability 0 No Electronic Signature(s) Signed: 08/01/2021 4:30:31 PM By: Angelina Pih Entered By: Angelina Pih on 08/01/2021 10:44:07 [name] ([id number]) -------------------------------------------------------------------------------- Foot Assessment Details Patient Name: [name]. Date of Service: 08/01/2021 10:00 AM Medical Record Number: [id number] Patient Account Number: [id number] Date of Birth/Sex: September 28, 1933 (87 y.o. F) Treating RN: Angelina Pih Primary Care Halle Davlin: [name] Other Clinician: Referring Lavin Petteway: Levora Dredge Treating Goldman Birchall/Extender: Allen Derry Weeks in Treatment: 0 Foot Assessment Items Site Locations + = Sensation present, - = Sensation absent, C = Callus, U = Ulcer R = Redness, W = Warmth, M = Maceration, PU = Pre-ulcerative lesion F = Fissure, S = Swelling, D = Dryness Assessment Right: Left: Other Deformity: No No Prior Foot Ulcer: No No Prior Amputation: No  No Charcot Joint: No No Ambulatory Status: Ambulatory With Help Assistance Device: Wheelchair Gait: Academic librarian Signature(s) Signed: 08/01/2021 4:30:31 PM By: Angelina Pih Entered By: Angelina Pih on 08/01/2021 10:58:05 Stthomas, [name] ([id number]) -------------------------------------------------------------------------------- Nutrition Risk Screening Details Patient Name: [place], [name]. Date of Service: 08/01/2021 10:00 AM Medical Record Number: [id number] Patient Account Number: [id number] Date of Birth/Sex: [birth year] (87 y.o. F) Treating RN: Angelina Pih Primary Care Brennan Litzinger: [name] Other Clinician: Referring Shatera Rennert: Levora Dredge Treating Aidenjames Heckmann/Extender: Allen Derry Weeks in Treatment: 0 Height (in): 64 Weight (lbs): Body Mass Index (BMI): Nutrition Risk Screening Items Score Screening NUTRITION RISK SCREEN: I have an illness or condition that made me change the kind and/or amount of food I eat 0 No I eat fewer than two meals per day 0 No I eat few fruits and vegetables, or milk products 0 No I have three or more drinks of beer, liquor or wine almost every day 0 No I have tooth or mouth problems that make it hard for me to eat 0 No I don't always have enough money to buy the food I need 0 No I eat alone most of the time 0 No I take three or more different prescribed or over-the-counter drugs a day 0 No Without wanting to, I have lost or gained 10 pounds in the last six months 0 No I am not always physically able to shop, cook and/or feed myself 0 No Nutrition Protocols Good Risk Protocol 0 No interventions needed Moderate Risk Protocol High Risk Proctocol Risk Level: Good Risk Score: 0 Electronic Signature(s) Signed: 08/01/2021 4:30:31 PM By: Angelina Pih Entered By: Angelina Pih on 08/01/2021 10:44:22

## 2021-08-01 NOTE — Progress Notes (Signed)
ALIZA, MORET ([id number]) Visit Report for 08/01/2021 Allergy List Details Patient Name: [name], [name]. Date of Service: 08/01/2021 10:00 AM Medical Record Number: [id number] Patient Account Number: [id number] Date of Birth/Sex: 1933/12/18 (88 y.o. F) Treating RN: Levora Dredge Primary Care Alvie Fowles: [name] Other Clinician: Referring Alline Pio: Hortencia Pilar Treating Amala Petion/Extender: Jeri Cos Weeks in Treatment: 0 Allergies Active Allergies metformin Reaction: nausea and vomiting Allergy Notes Electronic Signature(s) Signed: 08/01/2021 4:30:31 PM By: Levora Dredge Entered By: Levora Dredge on 08/01/2021 10:39:39 Winsett, [name] ([id number]) -------------------------------------------------------------------------------- Arrival Information Details Patient Name: [name] Date of Service: 08/01/2021 10:00 AM Medical Record Number: [id number] Patient Account Number: [id number] Date of Birth/Sex: [birth year] (88 y.o. F) Treating RN: Levora Dredge Primary Care Ruddy Swire: [name] Other Clinician: Referring Thena Devora: Hortencia Pilar Treating Vernesha Talbot/Extender: Skipper Cliche in Treatment: 0 Visit Information Patient Arrived: Wheel Chair Arrival Time: 10:37 Accompanied By: son Transfer Assistance: EasyPivot Patient Lift Patient Identification Verified: Yes Secondary Verification Process Completed: Yes Patient Has Alerts: Yes Patient Alerts: Patient on Blood Thinner ABI 07/11/21 R 1.11 TBI 0.3 ABI 07/11/21 L 1.09 TBI 0.3 Electronic Signature(s) Signed: 08/01/2021 4:30:31 PM By: Levora Dredge Previous Signature: 08/01/2021 11:12:41 AM Version By: Levora Dredge Entered By: Levora Dredge on 08/01/2021 11:23:18 Roodhouse, [name] ([id number]) -------------------------------------------------------------------------------- Clinic Level of Care Assessment Details Patient Name: [name] Date of Service: 08/01/2021 10:00  AM Medical Record Number: [id number] Patient Account Number: [id number] Date of Birth/Sex: 1933/02/27 (88 y.o. F) Treating RN: Levora Dredge Primary Care Edrees Valent: [name] Other Clinician: Referring Larkyn Greenberger: Hortencia Pilar Treating Jessie Schrieber/Extender: Skipper Cliche in Treatment: 0 Clinic Level of Care Assessment Items TOOL 1 Quantity Score _0  - Use when EandM and Procedure is performed on INITIAL visit 0 ASSESSMENTS - Nursing Assessment / Reassessment X - General Physical Exam (combine w/ comprehensive assessment (listed just below) when performed on new 1 20 pt. evals) X- 1 25 Comprehensive Assessment (HX, ROS, Risk Assessments, Wounds Hx, etc.) ASSESSMENTS - Wound and Skin Assessment / Reassessment _1  - Dermatologic / Skin Assessment (not related to wound area) 0 ASSESSMENTS - Ostomy and/or Continence Assessment and Care _2  - Incontinence Assessment and Management 0 _3  - 0 Ostomy Care Assessment and Management (repouching, etc.) PROCESS - Coordination of Care X - Simple Patient / Family Education for ongoing care 1 15 _4  - 0 Complex (extensive) Patient / Family Education for ongoing care X- 1 10 Staff obtains Programmer, systems, Records, Test Results / Process Orders _5  - 0 Staff telephones HHA, Nursing Homes / Clarify orders / etc _6  - 0 Routine Transfer to another Facility (non-emergent condition) _7  - 0 Routine Hospital Admission (non-emergent condition) X- 1 15 New Admissions / Biomedical engineer / Ordering NPWT, Apligraf, etc. _8  - 0 Emergency Hospital Admission (emergent condition) PROCESS - Special Needs _9  - Pediatric / Minor Patient Management 0 _10  - 0 Isolation Patient Management _11  - 0 Hearing / Language / Visual special needs _12  - 0 Assessment of Community assistance (transportation, D/C planning, etc.) _13  - 0 Additional assistance / Altered mentation _14  - 0 Support Surface(s) Assessment (bed, cushion, seat, etc.) INTERVENTIONS -  Miscellaneous _15  - External ear exam 0 X- 1 10 Patient Transfer (multiple staff / Civil Service fast streamer / Similar devices) _16  - 0 Simple Staple / Suture removal (25 or less) _17  - 0 Complex Staple / Suture removal (26 or more) _18  - 0 Hypo/Hyperglycemic Management (do not check if billed separately) _19  - 0 Ankle / Brachial Index (ABI) - do not  check if billed separately Has the patient been seen at the hospital within the last three years: Yes Total Score: 95 Level Of Care: New/Established - Level 3 Beth, JASLIN NOVITSKI ([id number]) Electronic Signature(s) Signed: 08/01/2021 4:30:31 PM By: Levora Dredge Entered By: Levora Dredge on 08/01/2021 12:52:09 [place]iere, [name] ([id number]) -------------------------------------------------------------------------------- Encounter Discharge Information Details Patient Name: [name] Date of Service: 08/01/2021 10:00 AM Medical Record Number: [id number] Patient Account Number: [id number] Date of Birth/Sex: 07-30-1933 (88 y.o. F) Treating RN: Levora Dredge Primary Care Kris Burd: [name] Other Clinician: Referring Leviticus Harton: Hortencia Pilar Treating Taya Ashbaugh/Extender: Skipper Cliche in Treatment: 0 Encounter Discharge Information Items Post Procedure Vitals Discharge Condition: Stable Temperature (F): 97.5 Ambulatory Status: Wheelchair Pulse (bpm): 69 Discharge Destination: Home Respiratory Rate (breaths/min): 18 Transportation: Private Auto Blood Pressure (mmHg): 138/69 Accompanied By: son Schedule Follow-up Appointment: Yes Clinical Summary of Care: Electronic Signature(s) Signed: 08/01/2021 12:53:58 PM By: Levora Dredge Entered By: Levora Dredge on 08/01/2021 12:53:57 Hirano, [name] ([id number]) -------------------------------------------------------------------------------- Lower Extremity Assessment Details Patient Name: [name]. Date of Service: 08/01/2021 10:00 AM Medical Record Number:  [id number] Patient Account Number: [id number] Date of Birth/Sex: 1933/03/18 (88 y.o. F) Treating RN: Levora Dredge Primary Care Ahmet Schank: [name] Other Clinician: Referring Shavana Calder: Hortencia Pilar Treating Toluwanimi Radebaugh/Extender: Jeri Cos Weeks in Treatment: 0 Edema Assessment Assessed: [Left: No] [Right: No] Edema: [Left: Ye] [Right: s] Calf Left: Right: Point of Measurement: 31 cm From Medial Instep 33 cm Ankle Left: Right: Point of Measurement: 9 cm From Medial Instep 22.6 cm Vascular Assessment Pulses: Dorsalis Pedis Doppler Audible: [Left:Yes] Electronic Signature(s) Signed: 08/01/2021 4:30:31 PM By: Levora Dredge Entered By: Levora Dredge on 08/01/2021 10:58:48 Tormey, [name] ([id number]) -------------------------------------------------------------------------------- Multi Wound Chart Details Patient Name: [name] Date of Service: 08/01/2021 10:00 AM Medical Record Number: [id number] Patient Account Number: [id number] Date of Birth/Sex: April 28, 1933 (88 y.o. 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 Extremity Extremity Comorbid History: Hypertension, Type II Diabetes, Hypertension, Type II Diabetes, Hypertension, Type II Diabetes, Osteoarthritis, Neuropathy Osteoarthritis, Neuropathy Osteoarthritis, Neuropathy Date Acquired: 06/26/2021 06/26/2021  06/26/2021 Weeks of Treatment: 0 0 0 Wound Status: Open Open Open Wound Recurrence: No No No Measurements L x W x D (cm) 4.5x6.5x0.4 1.8x1.5x0.1 2x2.5x0.8 Area (cm) : 22.973 2.121 3.927 Volume (cm) : 9.189 0.212 3.142 Classification: Grade 2 Grade 2 Grade 2 Exudate Amount: Medium Medium Medium Exudate Type: Serosanguineous Serosanguineous Serosanguineous Exudate Color: red, brown red, brown red, brown Granulation Amount: Small (1-33%) None Present (0%) Medium (34-66%) Granulation Quality: Red, Pink N/A Red, Pink Necrotic Amount: Large (67-100%) Large (67-100%) Medium (34-66%) Necrotic Tissue: Eschar, Adherent Slough Eschar, Adherent [place] Exposed Structures: Fat Layer (Subcutaneous Tissue): N/A Fat Layer (Subcutaneous Tissue): Yes Yes Epithelialization: None None None Treatment Notes Electronic Signature(s) Signed: 08/01/2021 11:10:36 AM By: Levora Dredge Entered By: Levora Dredge on 08/01/2021 11:10:36 Jeffreys, [name] ([id number]) -------------------------------------------------------------------------------- Multi-Disciplinary Care Plan Details Patient Name: [name] Date of Service: 08/01/2021 10:00 AM Medical Record Number: [id number] Patient Account Number: [id number] Date of Birth/Sex: 08/21/1933 (88 y.o. F) Treating RN: Levora Dredge  Primary Care Jamez Ambrocio: [name] Other Clinician: Referring Arayla Kruschke: Hortencia Pilar Treating Rateel Beldin/Extender: Skipper Cliche in Treatment: 0 Active Inactive Orientation to the Wound Care Program Nursing Diagnoses: Knowledge deficit related to the wound healing center program Goals: Patient/caregiver will verbalize understanding of the [facility] Program Date Initiated: 08/01/2021 Target Resolution Date: 08/08/2021 Goal Status: Active Interventions: Provide education on orientation to the wound center Notes: Wound/Skin Impairment Nursing Diagnoses: Impaired tissue  integrity Knowledge deficit related to ulceration/compromised skin integrity Goals: Ulcer/skin breakdown will have a volume reduction of 30% by week 4 Date Initiated: 08/01/2021 Target Resolution Date: 08/29/2021 Goal Status: Active Ulcer/skin breakdown will have a volume reduction of 50% by week 8 Date Initiated: 08/01/2021 Target Resolution Date: 09/26/2021 Goal Status: Active Ulcer/skin breakdown will have a volume reduction of 80% by week 12 Date Initiated: 08/01/2021 Target Resolution Date: 10/24/2021 Goal Status: Active Ulcer/skin breakdown will heal within 14 weeks Date Initiated: 08/01/2021 Target Resolution Date: 11/07/2021 Goal Status: Active Interventions: Assess patient/caregiver ability to obtain necessary supplies Assess patient/caregiver ability to perform ulcer/skin care regimen upon admission and as needed Assess ulceration(s) every visit Provide education on ulcer and skin care Notes: Electronic Signature(s) Signed: 08/01/2021 11:10:26 AM By: Levora Dredge Entered By: Levora Dredge on 08/01/2021 11:10:26 Dworkin, [name] ([id number]) -------------------------------------------------------------------------------- Pain Assessment Details Patient Name: [name] Date of Service: 08/01/2021 10:00 AM Medical Record Number: [id number] Patient Account Number: [id number] Date of Birth/Sex: 02-18-1933 (88 y.o. F) Treating RN: Levora Dredge Primary Care Khira Cudmore: [name] Other Clinician: Referring Keatyn Luck: Hortencia Pilar Treating Mareli Antunes/Extender: Skipper Cliche in Treatment: 0 Active Problems Location of Pain Severity and Description of Pain Patient Has Paino Yes Site Locations Rate the pain. Current Pain Level: 10 Pain Management and Medication Current Pain Management: Notes pt states pain in left lower extremity Electronic Signature(s) Signed: 08/01/2021 4:30:31 PM By: Levora Dredge Entered By: Levora Dredge on 08/01/2021  10:38:10 Presti, [name] ([id number]) -------------------------------------------------------------------------------- Patient/Caregiver Education Details Patient Name: [name] Date of Service: 08/01/2021 10:00 AM Medical Record Number: [id number] Patient Account Number: [id number] Date of Birth/Gender: [birth year] (88 y.o. F) Treating RN: Levora Dredge Primary Care Physician: [name] Other Clinician: Referring Physician: Hortencia Pilar Treating Physician/Extender: Skipper Cliche in Treatment: 0 Education Assessment Education Provided To: Patient and Caregiver Education Topics Provided Welcome To [facility]: Handouts: [facility] To [facility] Methods: Explain/Verbal Responses: State content correctly Wound Debridement: Handouts: Wound Debridement Methods: Explain/Verbal Responses: State content correctly Wound/Skin Impairment: Handouts: Caring for Your Ulcer Methods: Explain/Verbal Responses: State content correctly Electronic Signature(s) Signed: 08/01/2021 4:30:31 PM By: Levora Dredge Entered By: Levora Dredge on 08/01/2021 12:52:38 Facundo, [name] ([id number]) -------------------------------------------------------------------------------- Wound Assessment Details Patient Name: [name]. Date of Service: 08/01/2021 10:00 AM Medical Record Number: [id number] Patient Account Number: [id number] Date of Birth/Sex: [birth year] (88 y.o. F) Treating RN: Levora Dredge Primary Care Jett Fukuda: [name] Other Clinician: Referring Merial Moritz: Hortencia Pilar Treating Davelle Anselmi/Extender: Jeri Cos Weeks in Treatment: 0 Wound Status Wound Number: 1 Primary Etiology: Diabetic Wound/Ulcer of the Lower Extremity Wound Location: Left Calcaneus Wound Status: Open Wounding Event: Gradually Appeared Comorbid Hypertension, Type II Diabetes, Osteoarthritis, History: Neuropathy Date Acquired: 06/26/2021 Weeks Of Treatment:  0 Clustered Wound: No Photos Wound Measurements Length: (cm) 4.5 Width: (cm) 6.5 Depth: (cm) 0.4 Area: (cm) 22.973 Volume: (cm) 9.189 % Reduction in Area: % Reduction in Volume: Epithelialization: None Tunneling: No Undermining: No Wound Description Classification: Grade 2 Exudate Amount: Medium Exudate Type: Serosanguineous Exudate  Color: red, brown Foul Odor After Cleansing: No Slough/Fibrino Yes Wound Bed Granulation Amount: Small (1-33%) Exposed Structure Granulation Quality: Red, Pink Fat Layer (Subcutaneous Tissue) Exposed: Yes Necrotic Amount: Large (67-100%) Necrotic Quality: Eschar, Adherent Slough Treatment Notes Wound #1 (Calcaneus) Wound Laterality: Left Cleanser Byram Ancillary Kit - 15 Day Supply Discharge Instruction: Use supplies as instructed; Kit contains: (15) Saline Bullets; (15) 3x3 Gauze; 15 pr Gloves Soap and Water Discharge Instruction: Gently cleanse wound with antibacterial soap, rinse and pat dry prior to dressing wounds Peri-Wound Care Topical Tye, Sugey GMarland Kitchen ([id number]) Primary Dressing Hydrofera Blue Ready Transfer Foam, 4x5 (in/in) Discharge Instruction: Apply Hydrofera Blue Ready to wound bed as directed Secondary Dressing ABD Pad 5x9 (in/in) Discharge Instruction: Cover with ABD pad Secured With Medipore Tape - 20M Medipore H Soft Cloth Surgical Tape, 2x2 (in/yd) Kerlix Roll Sterile or Non-Sterile 6-ply 4.5x4 (yd/yd) Discharge Instruction: Apply Kerlix as directed Stretch Net Dressing, Latex-free, Size 5, Small-Head / Shoulder / Thigh Discharge Instruction: size 4 Compression Wrap Compression Stockings Add-Ons Electronic Signature(s) Signed: 08/01/2021 4:30:31 PM By: Levora Dredge Entered By: Levora Dredge on 08/01/2021 11:00:14 [place], [name] ([id number]) -------------------------------------------------------------------------------- Wound Assessment Details Patient Name: [name]. Date of Service:  08/01/2021 10:00 AM Medical Record Number: [id number] Patient Account Number: [id number] Date of Birth/Sex: Nov 12, 1933 (88 y.o. F) Treating RN: Levora Dredge Primary Care Pascha Fogal: [name] Other Clinician: Referring Aniruddh Ciavarella: Hortencia Pilar Treating Kellee Sittner/Extender: Jeri Cos Weeks in Treatment: 0 Wound Status Wound Number: 2 Primary Etiology: Diabetic Wound/Ulcer of the Lower Extremity Wound Location: Left, Lateral Foot Wound Status: Open Wounding Event: Gradually Appeared Comorbid Hypertension, Type II Diabetes, Osteoarthritis, History: Neuropathy Date Acquired: 06/26/2021 Weeks Of Treatment: 0 Clustered Wound: No Photos Wound Measurements Length: (cm) 1.8 Width: (cm) 1.5 Depth: (cm) 0.1 Area: (cm) 2.121 Volume: (cm) 0.212 % Reduction in Area: % Reduction in Volume: Epithelialization: None Tunneling: No Undermining: No Wound Description Classification: Grade 2 Exudate Amount: Medium Exudate Type: Serosanguineous Exudate Color: red, brown Foul Odor After Cleansing: No Slough/Fibrino Yes Wound Bed Granulation Amount: None Present (0%) Necrotic Amount: Large (67-100%) Necrotic Quality: Eschar, Adherent Slough Treatment Notes Wound #2 (Foot) Wound Laterality: Left, Lateral Cleanser Byram Ancillary Kit - 15 Day Supply Discharge Instruction: Use supplies as instructed; Kit contains: (15) Saline Bullets; (15) 3x3 Gauze; 15 pr Gloves Soap and Water Discharge Instruction: Gently cleanse wound with antibacterial soap, rinse and pat dry prior to dressing wounds Peri-Wound Care Topical Youse, Kielee GMarland Kitchen ([id number]) Primary Dressing Hydrofera Blue Ready Transfer Foam, 4x5 (in/in) Discharge Instruction: Apply Hydrofera Blue Ready to wound bed as directed Secondary Dressing ABD Pad 5x9 (in/in) Discharge Instruction: Cover with ABD pad Secured With Medipore Tape - 20M Medipore H Soft Cloth Surgical Tape, 2x2 (in/yd) Kerlix Roll Sterile or Non-Sterile 6-ply  4.5x4 (yd/yd) Discharge Instruction: Apply Kerlix as directed Stretch Net Dressing, Latex-free, Size 5, Small-Head / Shoulder / Thigh Discharge Instruction: size 4 Compression Wrap Compression Stockings Add-Ons Electronic Signature(s) Signed: 08/01/2021 4:30:31 PM By: Levora Dredge Entered By: Levora Dredge on 08/01/2021 11:01:15 [place], [name] ([id number]) -------------------------------------------------------------------------------- Wound Assessment Details Patient Name: [name]. Date of Service: 08/01/2021 10:00 AM Medical Record Number: [id number] Patient Account Number: [id number] Date of Birth/Sex: 1933/06/05 (88 y.o. F) Treating RN: Levora Dredge Primary Care Alexandrya Chim: [name] Other Clinician: Referring Jayelle Page: Hortencia Pilar Treating Kellsey Sansone/Extender: Jeri Cos Weeks in Treatment: 0 Wound Status Wound Number: 3 Primary Etiology: Diabetic Wound/Ulcer of the Lower Extremity Wound Location: Left, Medial Lower Leg Wound Status: Open Wounding  Event: Gradually Appeared Comorbid Hypertension, Type II Diabetes, Osteoarthritis, History: Neuropathy Date Acquired: 06/26/2021 Weeks Of Treatment: 0 Clustered Wound: No Photos Wound Measurements Length: (cm) 2 Width: (cm) 2.5 Depth: (cm) 0.8 Area: (cm) 3.927 Volume: (cm) 3.142 % Reduction in Area: % Reduction in Volume: Epithelialization: None Tunneling: No Undermining: No Wound Description Classification: Grade 2 Exudate Amount: Medium Exudate Type: Serosanguineous Exudate Color: red, brown Foul Odor After Cleansing: No Slough/Fibrino Yes Wound Bed Granulation Amount: Medium (34-66%) Exposed Structure Granulation Quality: Red, Pink Fat Layer (Subcutaneous Tissue) Exposed: Yes Necrotic Amount: Medium (34-66%) Necrotic Quality: Adherent Slough Treatment Notes Wound #3 (Lower Leg) Wound Laterality: Left, Medial Cleanser Byram Ancillary Kit - 15 Day Supply Discharge Instruction: Use  supplies as instructed; Kit contains: (15) Saline Bullets; (15) 3x3 Gauze; 15 pr Gloves Soap and Water Discharge Instruction: Gently cleanse wound with antibacterial soap, rinse and pat dry prior to dressing wounds Peri-Wound Care Topical Astacio, Courteny GMarland Kitchen ([id number]) Primary Dressing Hydrofera Blue Ready Transfer Foam, 4x5 (in/in) Discharge Instruction: Apply Hydrofera Blue Ready to wound bed as directed Secondary Dressing ABD Pad 5x9 (in/in) Discharge Instruction: Cover with ABD pad Secured With Medipore Tape - 67M Medipore H Soft Cloth Surgical Tape, 2x2 (in/yd) Kerlix Roll Sterile or Non-Sterile 6-ply 4.5x4 (yd/yd) Discharge Instruction: Apply Kerlix as directed Stretch Net Dressing, Latex-free, Size 5, Small-Head / Shoulder / Thigh Discharge Instruction: size 4 Compression Wrap Compression Stockings Add-Ons Electronic Signature(s) Signed: 08/01/2021 4:30:31 PM By: Levora Dredge Entered By: Levora Dredge on 08/01/2021 11:02:15 Rentz, [name] ([id number]) -------------------------------------------------------------------------------- Vitals Details Patient Name: [name] Date of Service: 08/01/2021 10:00 AM Medical Record Number: [id number] Patient Account Number: [id number] Date of Birth/Sex: Aug 26, 1933 (88 y.o. F) Treating RN: Levora Dredge Primary Care Javell Blackburn: [name] Other Clinician: Referring Jahmeek Shirk: Hortencia Pilar Treating Sumit Branham/Extender: Skipper Cliche in Treatment: 0 Vital Signs Time Taken: 10:35 Temperature (F): 97.5 Height (in): 64 Pulse (bpm): 69 Source: Stated Respiratory Rate (breaths/min): 18 Blood Pressure (mmHg): 138/69 Reference Range: 80 - 120 mg / dl Electronic Signature(s) Signed: 08/01/2021 4:30:31 PM By: Levora Dredge Entered By: Levora Dredge on 08/01/2021 10:38:43

## 2021-08-15 ENCOUNTER — Encounter: Payer: Medicare HMO | Attending: Physician Assistant | Admitting: Physician Assistant

## 2021-08-15 DIAGNOSIS — L97522 Non-pressure chronic ulcer of other part of left foot with fat layer exposed: Secondary | ICD-10-CM | POA: Insufficient documentation

## 2021-08-15 DIAGNOSIS — E1151 Type 2 diabetes mellitus with diabetic peripheral angiopathy without gangrene: Secondary | ICD-10-CM | POA: Diagnosis not present

## 2021-08-15 DIAGNOSIS — L97422 Non-pressure chronic ulcer of left heel and midfoot with fat layer exposed: Secondary | ICD-10-CM | POA: Diagnosis not present

## 2021-08-15 DIAGNOSIS — E11622 Type 2 diabetes mellitus with other skin ulcer: Secondary | ICD-10-CM | POA: Diagnosis not present

## 2021-08-15 DIAGNOSIS — E11621 Type 2 diabetes mellitus with foot ulcer: Secondary | ICD-10-CM | POA: Diagnosis not present

## 2021-08-15 DIAGNOSIS — L97822 Non-pressure chronic ulcer of other part of left lower leg with fat layer exposed: Secondary | ICD-10-CM | POA: Insufficient documentation

## 2021-08-15 DIAGNOSIS — F0150 Vascular dementia without behavioral disturbance: Secondary | ICD-10-CM | POA: Diagnosis not present

## 2021-08-15 NOTE — Progress Notes (Addendum)
MALY, LEMARR ([id number]) Visit Report for 08/15/2021 Chief Complaint Document Details Patient Name: [name], RODD. Date of Service: 08/15/2021 3:15 PM Medical Record Number: [id number] Patient Account Number: [id number] Date of Birth/Sex: 12/08/1933 (88 y.o. F) Treating RN: Levora Dredge Primary Care Provider: [name] Other Clinician: Referring Provider: [name] Treating Provider/Extender: Skipper Cliche in Treatment: 2 Information Obtained from: Patient Chief Complaint Left LE Ulcers Electronic Signature(s) Signed: 08/15/2021 3:39:24 PM By: Worthy Keeler PA-C Entered By: Worthy Keeler on 08/15/2021 15:39:24 [name], [name] ([id number]) -------------------------------------------------------------------------------- Debridement Details Patient Name: [name] Date of Service: 08/15/2021 3:15 PM Medical Record Number: [id number] Patient Account Number: [id number] Date of Birth/Sex: 09/03/1933 (88 y.o. F) Treating RN: Levora Dredge Primary Care Provider: [name] Other Clinician: Referring Provider: [name] Treating Provider/Extender: Skipper Cliche in Treatment: 2 Debridement Performed for Wound #1 Left Calcaneus Assessment: Performed By: Physician Tommie Sams., PA-C Debridement Type: Debridement Severity of Tissue Pre Debridement: Fat layer exposed Level of Consciousness (Pre- Awake and Alert procedure): Pre-procedure Verification/Time Out Yes - 16:15 Taken: Pain Control: Lidocaine 4% Topical Solution Total Area Debrided (L x W): 1.5 (cm) x 3.5 (cm) = 5.25 (cm) Tissue and other material Viable, Non-Viable, Slough, Subcutaneous, Biofilm, Slough debrided: Level: Skin/Subcutaneous Tissue Debridement Description: Excisional Instrument: Curette Bleeding: Minimum Hemostasis Achieved: Pressure Response to Treatment: Procedure was tolerated well Level of Consciousness (Post- Awake and Alert procedure): Post  Debridement Measurements of Total Wound Length: (cm) 1.5 Width: (cm) 3.5 Depth: (cm) 0.1 Volume: (cm) 0.412 Character of Wound/Ulcer Post Debridement: Stable Severity of Tissue Post Debridement: Fat layer exposed Post Procedure Diagnosis Same as Pre-procedure Electronic Signature(s) Signed: 08/15/2021 4:47:59 PM By: Levora Dredge Signed: 08/15/2021 4:56:25 PM By: Worthy Keeler PA-C Entered By: Levora Dredge on 08/15/2021 16:17:27 [name], [name] ([id number]) -------------------------------------------------------------------------------- Debridement Details Patient Name: [name] Date of Service: 08/15/2021 3:15 PM Medical Record Number: [id number] Patient Account Number: [id number] Date of Birth/Sex: [birth year] (88 y.o. F) Treating RN: Levora Dredge Primary Care Provider: [name] Other Clinician: Referring Provider: [name] Treating Provider/Extender: Skipper Cliche in Treatment: 2 Debridement Performed for Wound #2 Left,Lateral Foot Assessment: Performed By: Physician Tommie Sams., PA-C Debridement Type: Debridement Severity of Tissue Pre Debridement: Fat layer exposed Level of Consciousness (Pre- Awake and Alert procedure): Pre-procedure Verification/Time Out Yes - 16:18 Taken: Pain Control: Lidocaine 4% Topical Solution Total Area Debrided (L x W): 1.5 (cm) x 1 (cm) = 1.5 (cm) Tissue and other material Viable, Non-Viable, Slough, Subcutaneous, Biofilm, Slough debrided: Level: Skin/Subcutaneous Tissue Debridement Description: Excisional Instrument: Curette Bleeding: Minimum Hemostasis Achieved: Pressure Response to Treatment: Procedure was tolerated well Level of Consciousness (Post- Awake and Alert procedure): Post Debridement Measurements of Total Wound Length: (cm) 1.5 Width: (cm) 1 Depth: (cm) 0.1 Volume: (cm) 0.118 Character of Wound/Ulcer Post Debridement: Stable Severity of Tissue Post Debridement: Fat layer  exposed Post Procedure Diagnosis Same as Pre-procedure Electronic Signature(s) Signed: 08/15/2021 4:47:59 PM By: Levora Dredge Signed: 08/15/2021 4:56:25 PM By: Worthy Keeler PA-C Entered By: Levora Dredge on 08/15/2021 16:18:54 [name], [name] ([id number]) -------------------------------------------------------------------------------- Debridement Details Patient Name: [name] Date of Service: 08/15/2021 3:15 PM Medical Record Number: [id number] Patient Account Number: [id number] Date of Birth/Sex: 1933/12/19 (88 y.o. F) Treating RN: Levora Dredge Primary Care Provider: [name] Other Clinician: Referring Provider: [name] Treating Provider/Extender: Jeri Cos Weeks in Treatment: 2 Debridement Performed for Wound #3 Left,Medial Lower Leg Assessment: Performed By: Physician Tommie Sams.,  PA-C Debridement Type: Debridement Severity of Tissue Pre Debridement: Fat layer exposed Level of Consciousness (Pre- Awake and Alert procedure): Pre-procedure Verification/Time Out Yes - 16:15 Taken: Pain Control: Lidocaine 4% Topical Solution Total Area Debrided (L x W): 2 (cm) x 2.2 (cm) = 4.4 (cm) Tissue and other material Viable, Non-Viable, Slough, Subcutaneous, Biofilm, Slough debrided: Level: Skin/Subcutaneous Tissue Debridement Description: Excisional Instrument: Curette Bleeding: Minimum Hemostasis Achieved: Pressure Response to Treatment: Procedure was tolerated well Level of Consciousness (Post- Awake and Alert procedure): Post Debridement Measurements of Total Wound Length: (cm) 2 Width: (cm) 2.2 Depth: (cm) 0.5 Volume: (cm) 1.728 Character of Wound/Ulcer Post Debridement: Stable Severity of Tissue Post Debridement: Fat layer exposed Post Procedure Diagnosis Same as Pre-procedure Electronic Signature(s) Signed: 08/15/2021 4:47:59 PM By: Levora Dredge Signed: 08/15/2021 4:56:25 PM By: Worthy Keeler PA-C Entered By: Levora Dredge  on 08/15/2021 16:19:54 [name], [name] ([id number]) -------------------------------------------------------------------------------- HPI Details Patient Name: [name] Date of Service: 08/15/2021 3:15 PM Medical Record Number: [id number] Patient Account Number: [id number] Date of Birth/Sex: December 31, 1933 (88 y.o. F) Treating RN: Levora Dredge Primary Care Provider: [name] Other Clinician: Referring Provider: [name] Treating Provider/Extender: Skipper Cliche in Treatment: 2 History of Present Illness HPI Description: 08-01-2021 upon evaluation today patient appears to be doing better in regard to her wounds over the foot. This on the left foot as well as the lower extremity medially was all due to a peripheral vascular issue and arterial insufficiency. Subsequently the patient did undergo intervention in order to clear this away as far as the blockage was concerned. Dr. Delana Meyer performed an arteriogram on 06-11-2021. She had a follow-up ABI/TBI performed on 07-11-2021. This showed that there was a post intervention ABI of zero 1.09 on the left with heel monophasic today toe pressure of 0.37 but nonetheless the patient seems to be doing much better and actually appears to have the ability to heal at this time overall extremely pleased with what I am seeing and there is some need for sharp debridement today we will work on that at this point. Currently there is going to need to be some fairly aggressive debridement it does appear to be at this point. Patient has a history of diabetes mellitus type 2, peripheral vascular disease, and dementia. She is seen with her son who actually lives behind her and the patient actually lives alone but he takes good care of her and appears. 08-15-2021 upon evaluation today patient actually appears to be doing awesome in regard to her wounds. She has been tolerating the dressing changes without complication. Fortunately there does not appear  to be any signs of active infection locally or systemically at this time. No fevers, chills, nausea, vomiting, or diarrhea. Electronic Signature(s) Signed: 08/15/2021 4:24:21 PM By: Worthy Keeler PA-C Entered By: Worthy Keeler on 08/15/2021 16:24:21 Luck, [name] ([id number]) -------------------------------------------------------------------------------- Physical Exam Details Patient Name: [name], [name]. Date of Service: 08/15/2021 3:15 PM Medical Record Number: [id number] Patient Account Number: [id number] Date of Birth/Sex: 1933/10/08 (88 y.o. F) Treating RN: Levora Dredge Primary Care Provider: [name] Other Clinician: Referring Provider: [name] Treating Provider/Extender: Jeri Cos Weeks in Treatment: 2 Constitutional Well-nourished and well-hydrated in no acute distress. Respiratory normal breathing without difficulty. Psychiatric this patient is able to make decisions and demonstrates good insight into disease process. Alert and Oriented x 3. pleasant and cooperative. Notes Upon inspection patient's wound did require some sharp debridement clearway some of the necrotic debris she actually tolerated that  today without complication and postdebridement wound bed actually appears to be doing much better. I am very pleased with what we are seeing here. Electronic Signature(s) Signed: 08/15/2021 4:24:42 PM By: Worthy Keeler PA-C Entered By: Worthy Keeler on 08/15/2021 16:24:42 Halfway, [name] ([id number]) -------------------------------------------------------------------------------- Physician Orders Details Patient Name: [name] Date of Service: 08/15/2021 3:15 PM Medical Record Number: [id number] Patient Account Number: [id number] Date of Birth/Sex: 04/25/1933 (88 y.o. F) Treating RN: Levora Dredge Primary Care Provider: [name] Other Clinician: Referring Provider: [name] Treating Provider/Extender: Skipper Cliche  in Treatment: 2 Verbal / Phone Orders: No Diagnosis Coding ICD-10 Coding Code Description I73.89 Other specified peripheral vascular diseases E11.621 Type 2 diabetes mellitus with foot ulcer L97.522 Non-pressure chronic ulcer of other part of left foot with fat layer exposed L97.822 Non-pressure chronic ulcer of other part of left lower leg with fat layer exposed L97.422 Non-pressure chronic ulcer of left heel and midfoot with fat layer exposed Vascular dementia, unspecified severity, without behavioral disturbance, psychotic disturbance, mood disturbance, and F01.50 anxiety Follow-up Appointments o Return Appointment in 1 week. o Nurse Visit as needed [employer] o Wash wounds with antibacterial soap and water. - dial soap recommended o May shower; gently cleanse wound with antibacterial soap, rinse and pat dry prior to dressing wounds o No tub bath. Edema Control - Lymphedema / Segmental Compressive Device / Other o Elevate, Exercise Daily and Avoid Standing for Long Periods of Time. o Elevate leg(s) parallel to the floor when sitting. o DO YOUR BEST to sleep in the bed at night. DO NOT sleep in your recliner. Long hours of sitting in a recliner leads to swelling of the legs and/or potential wounds on your backside. Off-Loading o Turn and reposition every 2 hours o Other: - keep heels off bed when sleeping, do best to keep pressure off heels Wound Treatment Wound #1 - Calcaneus Wound Laterality: Left Cleanser: Byram Ancillary Kit - 15 Day Supply (Generic) 3 x Per Week/30 Days Discharge Instructions: Use supplies as instructed; Kit contains: (15) Saline Bullets; (15) 3x3 Gauze; 15 pr Gloves Cleanser: Soap and Water 3 x Per Week/30 Days Discharge Instructions: Gently cleanse wound with antibacterial soap, rinse and pat dry prior to dressing wounds Primary Dressing: Hydrofera Blue Ready Transfer Foam, 2.5x2.5 (in/in) (Generic) 3 x Per Week/30  Days Discharge Instructions: Apply Hydrofera Blue Ready to wound bed as directed Secondary Dressing: ABD Pad 5x9 (in/in) (Generic) 3 x Per Week/30 Days Discharge Instructions: Cover with ABD pad Secured With: Medipore Tape - 18M Medipore H Soft Cloth Surgical Tape, 2x2 (in/yd) (Generic) 3 x Per Week/30 Days Secured With: [employer] Sterile or Non-Sterile 6-ply 4.5x4 (yd/yd) (Generic) 3 x Per Week/30 Days Discharge Instructions: Apply Kerlix as directed Secured With: [employer] Dressing, Latex-free, Size 5, Small-Head / Shoulder / Thigh 3 x Per Week/30 Days Discharge Instructions: size 4 Wound #2 - Foot Wound Laterality: Left, Lateral Cleanser: Byram Ancillary Kit - 15 Day Supply (Generic) 3 x Per Week/30 Days [name], [name]. ([id number]) Discharge Instructions: Use supplies as instructed; Kit contains: (15) Saline Bullets; (15) 3x3 Gauze; 15 pr Gloves Cleanser: Soap and Water 3 x Per Week/30 Days Discharge Instructions: Gently cleanse wound with antibacterial soap, rinse and pat dry prior to dressing wounds Primary Dressing: Hydrofera Blue Ready Transfer Foam, 2.5x2.5 (in/in) (Generic) 3 x Per Week/30 Days Discharge Instructions: Apply Hydrofera Blue Ready to wound bed as directed Secondary Dressing: ABD Pad 5x9 (in/in) (Generic) 3 x Per  Week/30 Days Discharge Instructions: Cover with ABD pad Secured With: [employer] Soft Cloth Surgical Tape, 2x2 (in/yd) (Generic) 3 x Per Week/30 Days Secured With: Kerlix Roll Sterile or Non-Sterile 6-ply 4.5x4 (yd/yd) (Generic) 3 x Per Week/30 Days Discharge Instructions: Apply Kerlix as directed Secured With: Stretch Net Dressing, Latex-free, Size 5, Small-Head / Shoulder / Thigh 3 x Per Week/30 Days Discharge Instructions: size 4 Wound #3 - Lower Leg Wound Laterality: Left, Medial Cleanser: Byram Ancillary Kit - 15 Day Supply (Generic) 3 x Per Week/30 Days Discharge Instructions: Use supplies as instructed; Kit contains: (15) Saline  Bullets; (15) 3x3 Gauze; 15 pr Gloves Cleanser: Soap and Water 3 x Per Week/30 Days Discharge Instructions: Gently cleanse wound with antibacterial soap, rinse and pat dry prior to dressing wounds Primary Dressing: Hydrofera Blue Ready Transfer Foam, 2.5x2.5 (in/in) (Generic) 3 x Per Week/30 Days Discharge Instructions: Apply Hydrofera Blue Ready to wound bed as directed Secondary Dressing: ABD Pad 5x9 (in/in) (Generic) 3 x Per Week/30 Days Discharge Instructions: Cover with ABD pad Secured With: Medipore Tape - 82M Medipore H Soft Cloth Surgical Tape, 2x2 (in/yd) (Generic) 3 x Per Week/30 Days Secured With: Kerlix Roll Sterile or Non-Sterile 6-ply 4.5x4 (yd/yd) (Generic) 3 x Per Week/30 Days Discharge Instructions: Apply Kerlix as directed Secured With: Stretch Net Dressing, Latex-free, Size 5, Small-Head / Shoulder / Thigh 3 x Per Week/30 Days Discharge Instructions: size 4 Electronic Signature(s) Signed: 08/15/2021 4:47:59 PM By: Levora Dredge Signed: 08/15/2021 4:56:25 PM By: Worthy Keeler PA-C Entered By: Levora Dredge on 08/15/2021 16:30:20 [name], [name] ([id number]) -------------------------------------------------------------------------------- Problem List Details Patient Name: [name]. Date of Service: 08/15/2021 3:15 PM Medical Record Number: [id number] Patient Account Number: [id number] Date of Birth/Sex: [birth year] (88 y.o. F) Treating RN: Levora Dredge Primary Care Provider: [name] Other Clinician: Referring Provider: [name] Treating Provider/Extender: Skipper Cliche in Treatment: 2 Active Problems ICD-10 Encounter Code Description Active Date MDM Diagnosis I73.89 Other specified peripheral vascular diseases 08/01/2021 No Yes E11.621 Type 2 diabetes mellitus with foot ulcer 08/01/2021 No Yes L97.522 Non-pressure chronic ulcer of other part of left foot with fat layer 08/01/2021 No Yes exposed L97.822 Non-pressure chronic ulcer of other  part of left lower leg with fat layer 08/01/2021 No Yes exposed L97.422 Non-pressure chronic ulcer of left heel and midfoot with fat layer 08/01/2021 No Yes exposed F01.50 Vascular dementia, unspecified severity, without behavioral disturbance, 08/01/2021 No Yes psychotic disturbance, mood disturbance, and anxiety Inactive Problems Resolved Problems Electronic Signature(s) Signed: 08/15/2021 3:39:21 PM By: Worthy Keeler PA-C Entered By: Worthy Keeler on 08/15/2021 15:39:20 [name], [name] ([id number]) -------------------------------------------------------------------------------- Progress Note Details Patient Name: [name] Date of Service: 08/15/2021 3:15 PM Medical Record Number: [id number] Patient Account Number: [id number] Date of Birth/Sex: 1933-12-22 (88 y.o. F) Treating RN: Levora Dredge Primary Care Provider: [name] Other Clinician: Referring Provider: [name] Treating Provider/Extender: Skipper Cliche in Treatment: 2 Subjective Chief Complaint Information obtained from Patient Left LE Ulcers History of Present Illness (HPI) 08-01-2021 upon evaluation today patient appears to be doing better in regard to her wounds over the foot. This on the left foot as well as the lower extremity medially was all due to a peripheral vascular issue and arterial insufficiency. Subsequently the patient did undergo intervention in order to clear this away as far as the blockage was concerned. Dr. Delana Meyer performed an arteriogram on 06-11-2021. She had a follow-up ABI/TBI performed on 07-11-2021. This showed that  there was a post intervention ABI of zero 1.09 on the left with heel monophasic today toe pressure of 0.37 but nonetheless the patient seems to be doing much better and actually appears to have the ability to heal at this time overall extremely pleased with what I am seeing and there is some need for sharp debridement today we will work on that at this point.  Currently there is going to need to be some fairly aggressive debridement it does appear to be at this point. Patient has a history of diabetes mellitus type 2, peripheral vascular disease, and dementia. She is seen with her son who actually lives behind her and the patient actually lives alone but he takes good care of her and appears. 08-15-2021 upon evaluation today patient actually appears to be doing awesome in regard to her wounds. She has been tolerating the dressing changes without complication. Fortunately there does not appear to be any signs of active infection locally or systemically at this time. No fevers, chills, nausea, vomiting, or diarrhea. Objective Constitutional Well-nourished and well-hydrated in no acute distress. Vitals Time Taken: 3:59 PM, Height: 64 in, Temperature: 97.5 F, Pulse: 73 bpm, Respiratory Rate: 18 breaths/min, Blood Pressure: 146/80 mmHg. Respiratory normal breathing without difficulty. Psychiatric this patient is able to make decisions and demonstrates good insight into disease process. Alert and Oriented x 3. pleasant and cooperative. General Notes: Upon inspection patient's wound did require some sharp debridement clearway some of the necrotic debris she actually tolerated that today without complication and postdebridement wound bed actually appears to be doing much better. I am very pleased with what we are seeing here. Integumentary (Hair, Skin) Wound #1 status is Open. Original cause of wound was Gradually Appeared. The date acquired was: 06/26/2021. The wound has been in treatment 2 weeks. The wound is located on the Left Calcaneus. The wound measures 1.5cm length x 3.5cm width x 0.1cm depth; 4.123cm^2 area and 0.412cm^3 volume. There is Fat Layer (Subcutaneous Tissue) exposed. There is a medium amount of serosanguineous drainage noted. There is small (1-33%) red, pink granulation within the wound bed. There is a large (67-100%) amount of necrotic  tissue within the wound bed including Eschar and Adherent Slough. Wound #2 status is Open. Original cause of wound was Gradually Appeared. The date acquired was: 06/26/2021. The wound has been in treatment 2 weeks. The wound is located on the Left,Lateral Foot. The wound measures 1.5cm length x 1cm width x 0.1cm depth; 1.178cm^2 area and 0.118cm^3 volume. There is a medium amount of serosanguineous drainage noted. There is no granulation within the wound bed. There is a large (67-100%) amount of necrotic tissue within the wound bed including Eschar and Adherent Slough. Wound #3 status is Open. Original cause of wound was Gradually Appeared. The date acquired was: 06/26/2021. The wound has been in treatment 2 weeks. The wound is located on the Left,Medial Lower Leg. The wound measures 2cm length x 2.2cm width x 0.5cm depth; 3.456cm^2 area and 1.728cm^3 volume. There is Fat Layer (Subcutaneous Tissue) exposed. There is a medium amount of serosanguineous drainage noted. There [name], [name]. ([id number]) is medium (34-66%) red, pink granulation within the wound bed. There is a medium (34-66%) amount of necrotic tissue within the wound bed including Adherent Slough. Assessment Active Problems ICD-10 Other specified peripheral vascular diseases Type 2 diabetes mellitus with foot ulcer Non-pressure chronic ulcer of other part of left foot with fat layer exposed Non-pressure chronic ulcer of other part of left lower leg  with fat layer exposed Non-pressure chronic ulcer of left heel and midfoot with fat layer exposed Vascular dementia, unspecified severity, without behavioral disturbance, psychotic disturbance, mood disturbance, and anxiety Procedures Wound #1 Pre-procedure diagnosis of Wound #1 is a Diabetic Wound/Ulcer of the Lower Extremity located on the Left Calcaneus .Severity of Tissue Pre Debridement is: Fat layer exposed. There was a Excisional Skin/Subcutaneous Tissue Debridement with a  total area of 5.25 sq cm performed by Tommie Sams., PA-C. With the following instrument(s): Curette to remove Viable and Non-Viable tissue/material. Material removed includes Subcutaneous Tissue, Slough, and Biofilm after achieving pain control using Lidocaine 4% Topical Solution. No specimens were taken. A time out was conducted at 16:15, prior to the start of the procedure. A Minimum amount of bleeding was controlled with Pressure. The procedure was tolerated well. Post Debridement Measurements: 1.5cm length x 3.5cm width x 0.1cm depth; 0.412cm^3 volume. Character of Wound/Ulcer Post Debridement is stable. Severity of Tissue Post Debridement is: Fat layer exposed. Post procedure Diagnosis Wound #1: Same as Pre-Procedure Wound #2 Pre-procedure diagnosis of Wound #2 is a Diabetic Wound/Ulcer of the Lower Extremity located on the Left,Lateral Foot .Severity of Tissue Pre Debridement is: Fat layer exposed. There was a Excisional Skin/Subcutaneous Tissue Debridement with a total area of 1.5 sq cm performed by Tommie Sams., PA-C. With the following instrument(s): Curette to remove Viable and Non-Viable tissue/material. Material removed includes Subcutaneous Tissue, Slough, and Biofilm after achieving pain control using Lidocaine 4% Topical Solution. No specimens were taken. A time out was conducted at 16:18, prior to the start of the procedure. A Minimum amount of bleeding was controlled with Pressure. The procedure was tolerated well. Post Debridement Measurements: 1.5cm length x 1cm width x 0.1cm depth; 0.118cm^3 volume. Character of Wound/Ulcer Post Debridement is stable. Severity of Tissue Post Debridement is: Fat layer exposed. Post procedure Diagnosis Wound #2: Same as Pre-Procedure Wound #3 Pre-procedure diagnosis of Wound #3 is a Diabetic Wound/Ulcer of the Lower Extremity located on the Left,Medial Lower Leg .Severity of Tissue Pre Debridement is: Fat layer exposed. There was a Excisional  Skin/Subcutaneous Tissue Debridement with a total area of 4.4 sq cm performed by Tommie Sams., PA-C. With the following instrument(s): Curette to remove Viable and Non-Viable tissue/material. Material removed includes Subcutaneous Tissue, Slough, and Biofilm after achieving pain control using Lidocaine 4% Topical Solution. No specimens were taken. A time out was conducted at 16:15, prior to the start of the procedure. A Minimum amount of bleeding was controlled with Pressure. The procedure was tolerated well. Post Debridement Measurements: 2cm length x 2.2cm width x 0.5cm depth; 1.728cm^3 volume. Character of Wound/Ulcer Post Debridement is stable. Severity of Tissue Post Debridement is: Fat layer exposed. Post procedure Diagnosis Wound #3: Same as Pre-Procedure Plan 1. I am going to suggest that we go ahead and continue with the wound care measures as before and the patient is in agreement with plan. This includes the use of the [place] [facility] dressing which is doing an awesome job. 2. I am also can recommend that we have the patient continue with the with the ABD pads to cover and the roll gauze to secure in place with tape. 3. She should continue to offload the heels which is doing awesome for her hopefully she will continue to make good progress here. We will see patient back for reevaluation in 1 week here in the clinic. If anything worsens or changes patient will contact our office for additional recommendations. [name], [name]. (  [id number]) Electronic Signature(s) Signed: 08/15/2021 4:26:27 PM By: Worthy Keeler PA-C Entered By: Worthy Keeler on 08/15/2021 16:26:27 [name], [name] ([id number]) -------------------------------------------------------------------------------- SuperBill Details Patient Name: [name] Date of Service: 08/15/2021 Medical Record Number: [id number] Patient Account Number: [id number] Date of Birth/Sex: 01/05/1934 (88 y.o. F) Treating RN: Levora Dredge Primary Care Provider: [name] Other Clinician: Referring Provider: [name] Treating Provider/Extender: Skipper Cliche in Treatment: 2 Diagnosis Coding ICD-10 Codes Code Description I73.89 Other specified peripheral vascular diseases E11.621 Type 2 diabetes mellitus with foot ulcer L97.522 Non-pressure chronic ulcer of other part of left foot with fat layer exposed L97.822 Non-pressure chronic ulcer of other part of left lower leg with fat layer exposed L97.422 Non-pressure chronic ulcer of left heel and midfoot with fat layer exposed Vascular dementia, unspecified severity, without behavioral disturbance, psychotic disturbance, mood disturbance, and F01.50 anxiety Facility Procedures CPT4 Code: 16553748 Description: 11042 - DEB SUBQ TISSUE 20 SQ CM/< Modifier: Quantity: 1 CPT4 Code: Description: ICD-10 Diagnosis Description L97.522 Non-pressure chronic ulcer of other part of left foot with fat layer expo L97.822 Non-pressure chronic ulcer of other part of left lower leg with fat layer L97.422 Non-pressure chronic ulcer of left heel  and midfoot with fat layer expose Modifier: sed exposed d Quantity: Physician Procedures CPT4 Code: 2707867 Description: 11042 - WC PHYS SUBQ TISS 20 SQ CM Modifier: Quantity: 1 CPT4 Code: Description: ICD-10 Diagnosis Description L97.522 Non-pressure chronic ulcer of other part of left foot with fat layer expo L97.822 Non-pressure chronic ulcer of other part of left lower leg with fat layer L97.422 Non-pressure chronic ulcer of left heel  and midfoot with fat layer expose Modifier: sed exposed d Quantity: Electronic Signature(s) Signed: 08/15/2021 4:50:20 PM By: Worthy Keeler PA-C Entered By: Worthy Keeler on 08/15/2021 16:50:20

## 2021-08-15 NOTE — Progress Notes (Addendum)
LINSIE, LUPO ([id number]) Visit Report for 08/15/2021 Arrival Information Details Patient Name: RAYNA, BRENNER. Date of Service: 08/15/2021 3:15 PM Medical Record Number: [id number] Patient Account Number: [id number] Date of Birth/Sex: [birth year] (88 y.o. F) Treating RN: Levora Dredge Primary Care Nesta Kimple: [name] Other Clinician: Referring Marquasia Schmieder: [name] Treating Uel Davidow/Extender: Skipper Cliche in Treatment: 2 Visit Information History Since Last Visit Added or deleted any medications: No Patient Arrived: Wheel Chair Any new allergies or adverse reactions: No Arrival Time: 15:53 Had a fall or experienced change in No Accompanied By: son activities of daily living that may affect Transfer Assistance: EasyPivot Patient Lift risk of falls: Patient Identification Verified: Yes Hospitalized since last visit: No Secondary Verification Process Completed: Yes Has Dressing in Place as Prescribed: Yes Patient Has Alerts: Yes Pain Present Now: No Patient Alerts: Patient on Blood Thinner ABI 07/11/21 R 1.11 TBI 0.3 ABI 07/11/21 L 1.09 TBI 0.3 Notes son transfers Electronic Signature(s) Signed: 08/15/2021 4:47:59 PM By: Levora Dredge Entered By: Levora Dredge on 08/15/2021 15:58:35 [place], [name] ([id number]) -------------------------------------------------------------------------------- Clinic Level of Care Assessment Details Patient Name: [name] Date of Service: 08/15/2021 3:15 PM Medical Record Number: [id number] Patient Account Number: [id number] Date of Birth/Sex: 29-Sep-1933 (88 y.o. F) Treating RN: Levora Dredge Primary Care Mazelle Huebert: [name] Other Clinician: Referring Soraida Vickers: [name] Treating Gerianne Simonet/Extender: Skipper Cliche in Treatment: 2 Clinic Level of Care Assessment Items TOOL 1 Quantity Score []  - Use when EandM and Procedure is performed on INITIAL visit 0 ASSESSMENTS - Nursing Assessment /  Reassessment []  - General Physical Exam (combine w/ comprehensive assessment (listed just below) when performed on new 0 pt. evals) []  - 0 Comprehensive Assessment (HX, ROS, Risk Assessments, Wounds Hx, etc.) ASSESSMENTS - Wound and Skin Assessment / Reassessment []  - Dermatologic / Skin Assessment (not related to wound area) 0 ASSESSMENTS - Ostomy and/or Continence Assessment and Care []  - Incontinence Assessment and Management 0 []  - 0 Ostomy Care Assessment and Management (repouching, etc.) PROCESS - Coordination of Care []  - Simple Patient / Family Education for ongoing care 0 []  - 0 Complex (extensive) Patient / Family Education for ongoing care []  - 0 Staff obtains Programmer, systems, Records, Test Results / Process Orders []  - 0 Staff telephones HHA, Nursing Homes / Clarify orders / etc []  - 0 Routine Transfer to another Facility (non-emergent condition) []  - 0 Routine Hospital Admission (non-emergent condition) []  - 0 New Admissions / Biomedical engineer / Ordering NPWT, Apligraf, etc. []  - 0 Emergency Hospital Admission (emergent condition) PROCESS - Special Needs []  - Pediatric / Minor Patient Management 0 []  - 0 Isolation Patient Management []  - 0 Hearing / Language / Visual special needs []  - 0 Assessment of Community assistance (transportation, D/C planning, etc.) []  - 0 Additional assistance / Altered mentation []  - 0 Support Surface(s) Assessment (bed, cushion, seat, etc.) INTERVENTIONS - Miscellaneous []  - External ear exam 0 []  - 0 Patient Transfer (multiple staff / Civil Service fast streamer / Similar devices) []  - 0 Simple Staple / Suture removal (25 or less) []  - 0 Complex Staple / Suture removal (26 or more) []  - 0 Hypo/Hyperglycemic Management (do not check if billed separately) []  - 0 Ankle / Brachial Index (ABI) - do not check if billed separately Has the patient been seen at the hospital within the last three years: Yes Total Score: 0 Level Of Care:  ____ [name] ([id number]) Electronic Signature(s) Signed: 08/15/2021 4:47:59 PM By: Levora Dredge Entered By:  Levora Dredge on 08/15/2021 16:30:27 [place], [name] ([id number]) -------------------------------------------------------------------------------- Encounter Discharge Information Details Patient Name: RAINELLE, SULEWSKI. Date of Service: 08/15/2021 3:15 PM Medical Record Number: [id number] Patient Account Number: [id number] Date of Birth/Sex: Jan 18, 1934 (88 y.o. F) Treating RN: Levora Dredge Primary Care Heydi Swango: [name] Other Clinician: Referring Jettie Mannor: [name] Treating Clovia Reine/Extender: Skipper Cliche in Treatment: 2 Encounter Discharge Information Items Post Procedure Vitals Discharge Condition: Stable Temperature (F): 97.5 Ambulatory Status: Wheelchair Pulse (bpm): 73 Discharge Destination: Home Respiratory Rate (breaths/min): 18 Transportation: Private Auto Blood Pressure (mmHg): 146/80 Accompanied By: son Schedule Follow-up Appointment: Yes Clinical Summary of Care: Electronic Signature(s) Signed: 08/15/2021 4:47:59 PM By: Levora Dredge Entered By: Levora Dredge on 08/15/2021 16:31:42 [place], [name] ([id number]) -------------------------------------------------------------------------------- Lower Extremity Assessment Details Patient Name: [name]. Date of Service: 08/15/2021 3:15 PM Medical Record Number: [id number] Patient Account Number: [id number] Date of Birth/Sex: [birth year] (88 y.o. F) Treating RN: Levora Dredge Primary Care Jemimah Cressy: [name] Other Clinician: Referring Katherene Dinino: [name] Treating Veronica Fretz/Extender: Jeri Cos Weeks in Treatment: 2 Edema Assessment Assessed: [Left: Yes] [Right: No] Edema: [Left: Ye] [Right: s] Calf Left: Right: Point of Measurement: 31 cm From Medial Instep 32.6 cm Ankle Left: Right: Point of Measurement: 9 cm From Medial Instep 22.4 cm Vascular  Assessment Pulses: Dorsalis Pedis Palpable: [Left:Yes] Electronic Signature(s) Signed: 08/15/2021 4:47:59 PM By: Levora Dredge Entered By: Levora Dredge on 08/15/2021 16:09:00 [place], [name] ([id number]) -------------------------------------------------------------------------------- Multi Wound Chart Details Patient Name: [name] Date of Service: 08/15/2021 3:15 PM Medical Record Number: [id number] Patient Account Number: [id number] Date of Birth/Sex: Aug 19, 1933 (88 y.o. F) Treating RN: Levora Dredge Primary Care Versie Fleener: [name] Other Clinician: Referring Mindee Robledo: [name] Treating Summit Borchardt/Extender: Skipper Cliche in Treatment: 2 Vital Signs Height(in): 89 Pulse(bpm): 50 Weight(lbs): Blood Pressure(mmHg): 146/80 Body Mass Index(BMI): Temperature(F): 97.5 Respiratory Rate(breaths/min): 18 Photos: Wound Location: Left Calcaneus Left, Lateral Foot Left, Medial Lower Leg Wounding Event: Gradually Appeared Gradually Appeared Gradually Appeared Primary Etiology: Diabetic Wound/Ulcer of the Lower Diabetic Wound/Ulcer of the Lower Diabetic Wound/Ulcer of the Lower Extremity Extremity Extremity Comorbid History: Hypertension, Type II Diabetes, Hypertension, Type II Diabetes, Hypertension, Type II Diabetes, Osteoarthritis, Neuropathy Osteoarthritis, Neuropathy Osteoarthritis, Neuropathy Date Acquired: 06/26/2021 06/26/2021 06/26/2021 Weeks of Treatment: 2 2 2  Wound Status: Open Open Open Wound Recurrence: No No No Measurements L x W x D (cm) 1.5x3.5x0.1 1.5x1x0.1 2x2.2x0.5 Area (cm) : 4.123 1.178 3.456 Volume (cm) : 0.412 0.118 1.728 % Reduction in Area: 82.10% 44.50% 12.00% % Reduction in Volume: 95.50% 44.30% 45.00% Classification: Grade 2 Grade 2 Grade 2 Exudate Amount: Medium Medium Medium Exudate Type: Serosanguineous Serosanguineous Serosanguineous Exudate Color: red, brown red, brown red, brown Granulation Amount: Small (1-33%) None  Present (0%) Medium (34-66%) Granulation Quality: Red, Pink N/A Red, Pink Necrotic Amount: Large (67-100%) Large (67-100%) Medium (34-66%) Necrotic Tissue: Eschar, Adherent Slough Eschar, Adherent Allen Exposed Structures: Fat Layer (Subcutaneous Tissue): N/A Fat Layer (Subcutaneous Tissue): Yes Yes Epithelialization: None None None Treatment Notes Electronic Signature(s) Signed: 08/15/2021 4:47:59 PM By: Levora Dredge Entered By: Levora Dredge on 08/15/2021 16:09:47 [place], [name] ([id number]) -------------------------------------------------------------------------------- Multi-Disciplinary Care Plan Details Patient Name: [name] Date of Service: 08/15/2021 3:15 PM Medical Record Number: [id number] Patient Account Number: [id number] Date of Birth/Sex: 03-20-1933 (88 y.o. F) Treating RN: Levora Dredge Primary Care Wendie Diskin: [name] Other Clinician: Referring Graydon Fofana: [name] Treating Brennan Karam/Extender: Skipper Cliche in Treatment: 2 Active Inactive Wound/Skin Impairment Nursing Diagnoses: Impaired tissue integrity Knowledge deficit  related to ulceration/compromised skin integrity Goals: Ulcer/skin breakdown will have a volume reduction of 30% by week 4 Date Initiated: 08/01/2021 Target Resolution Date: 08/29/2021 Goal Status: Active Ulcer/skin breakdown will have a volume reduction of 50% by week 8 Date Initiated: 08/01/2021 Target Resolution Date: 09/26/2021 Goal Status: Active Ulcer/skin breakdown will have a volume reduction of 80% by week 12 Date Initiated: 08/01/2021 Target Resolution Date: 10/24/2021 Goal Status: Active Ulcer/skin breakdown will heal within 14 weeks Date Initiated: 08/01/2021 Target Resolution Date: 11/07/2021 Goal Status: Active Interventions: Assess patient/caregiver ability to obtain necessary supplies Assess patient/caregiver ability to perform ulcer/skin care regimen upon admission and as  needed Assess ulceration(s) every visit Provide education on ulcer and skin care Notes: Electronic Signature(s) Signed: 08/15/2021 4:47:59 PM By: Levora Dredge Entered By: Levora Dredge on 08/15/2021 16:09:36 [place], [name] ([id number]) -------------------------------------------------------------------------------- Pain Assessment Details Patient Name: [name] Date of Service: 08/15/2021 3:15 PM Medical Record Number: [id number] Patient Account Number: [id number] Date of Birth/Sex: 09/27/1933 (88 y.o. F) Treating RN: Levora Dredge Primary Care Chee Dimon: [name] Other Clinician: Referring Shaeleigh Graw: [name] Treating Aloysuis Ribaudo/Extender: Skipper Cliche in Treatment: 2 Active Problems Location of Pain Severity and Description of Pain Patient Has Paino No Site Locations Rate the pain. Current Pain Level: 0 Pain Management and Medication Current Pain Management: Electronic Signature(s) Signed: 08/15/2021 4:47:59 PM By: Levora Dredge Entered By: Levora Dredge on 08/15/2021 15:59:52 [place], [name] ([id number]) -------------------------------------------------------------------------------- Patient/Caregiver Education Details Patient Name: [name] Date of Service: 08/15/2021 3:15 PM Medical Record Number: [id number] Patient Account Number: [id number] Date of Birth/Gender: [birth year] (88 y.o. F) Treating RN: Levora Dredge Primary Care Physician: [name] Other Clinician: Referring Physician: [name] Treating Physician/Extender: Skipper Cliche in Treatment: 2 Education Assessment Education Provided To: Patient Education Topics Provided Wound Debridement: Handouts: Wound Debridement Methods: Explain/Verbal Responses: State content correctly Wound/Skin Impairment: Handouts: Caring for Your Ulcer Methods: Explain/Verbal Responses: State content correctly Electronic Signature(s) Signed: 08/15/2021 4:47:59 PM By:  Levora Dredge Entered By: Levora Dredge on 08/15/2021 16:30:45 [place], [name] ([id number]) -------------------------------------------------------------------------------- Wound Assessment Details Patient Name: [place], [name]. Date of Service: 08/15/2021 3:15 PM Medical Record Number: [id number] Patient Account Number: [id number] Date of Birth/Sex: 11-15-1933 (88 y.o. F) Treating RN: Levora Dredge Primary Care Oneita Allmon: [name] Other Clinician: Referring Brondon Wann: [name] Treating Dazja Houchin/Extender: Jeri Cos Weeks in Treatment: 2 Wound Status Wound Number: 1 Primary Etiology: Diabetic Wound/Ulcer of the Lower Extremity Wound Location: Left Calcaneus Wound Status: Open Wounding Event: Gradually Appeared Comorbid Hypertension, Type II Diabetes, Osteoarthritis, History: Neuropathy Date Acquired: 06/26/2021 Weeks Of Treatment: 2 Clustered Wound: No Photos Wound Measurements Length: (cm) 1.5 Width: (cm) 3.5 Depth: (cm) 0.1 Area: (cm) 4.123 Volume: (cm) 0.412 % Reduction in Area: 82.1% % Reduction in Volume: 95.5% Epithelialization: None Wound Description Classification: Grade 2 Exudate Amount: Medium Exudate Type: Serosanguineous Exudate Color: red, brown Foul Odor After Cleansing: No Slough/Fibrino Yes Wound Bed Granulation Amount: Small (1-33%) Exposed Structure Granulation Quality: Red, Pink Fat Layer (Subcutaneous Tissue) Exposed: Yes Necrotic Amount: Large (67-100%) Necrotic Quality: Eschar, Adherent Slough Treatment Notes Wound #1 (Calcaneus) Wound Laterality: Left Cleanser Byram Ancillary Kit - 15 Day Supply Discharge Instruction: Use supplies as instructed; Kit contains: (15) Saline Bullets; (15) 3x3 Gauze; 15 pr Gloves Soap and Water Discharge Instruction: Gently cleanse wound with antibacterial soap, rinse and pat dry prior to dressing wounds Peri-Wound Care Topical Schum, Briseis G. ([id number]) Primary Dressing Hydrofera Blue  Ready Transfer Foam, 2.5x2.5 (in/in) Discharge Instruction: Apply Hydrofera  Blue Ready to wound bed as directed Secondary Dressing ABD Pad 5x9 (in/in) Discharge Instruction: Cover with ABD pad Secured With [place] Surgical Tape, 2x2 (in/yd) Kerlix Roll Sterile or Non-Sterile 6-ply 4.5x4 (yd/yd) Discharge Instruction: Apply Kerlix as directed Stretch Net Dressing, Latex-free, Size 5, Small-Head / Shoulder / Thigh Discharge Instruction: size 4 Compression Wrap Compression Stockings Add-Ons Electronic Signature(s) Signed: 08/15/2021 4:47:59 PM By: Levora Dredge Entered By: Levora Dredge on 08/15/2021 16:06:57 [place], [name] ([id number]) -------------------------------------------------------------------------------- Wound Assessment Details Patient Name: [name]. Date of Service: 08/15/2021 3:15 PM Medical Record Number: [id number] Patient Account Number: [id number] Date of Birth/Sex: 1933-05-15 (88 y.o. F) Treating RN: Levora Dredge Primary Care Abelina Ketron: [name] Other Clinician: Referring Esai Stecklein: [name] Treating Davonna Ertl/Extender: Jeri Cos Weeks in Treatment: 2 Wound Status Wound Number: 2 Primary Etiology: Diabetic Wound/Ulcer of the Lower Extremity Wound Location: Left, Lateral Foot Wound Status: Open Wounding Event: Gradually Appeared Comorbid Hypertension, Type II Diabetes, Osteoarthritis, History: Neuropathy Date Acquired: 06/26/2021 Weeks Of Treatment: 2 Clustered Wound: No Photos Wound Measurements Length: (cm) 1.5 Width: (cm) 1 Depth: (cm) 0.1 Area: (cm) 1.178 Volume: (cm) 0.118 % Reduction in Area: 44.5% % Reduction in Volume: 44.3% Epithelialization: None Wound Description Classification: Grade 2 Exudate Amount: Medium Exudate Type: Serosanguineous Exudate Color: red, brown Foul Odor After Cleansing: No Slough/Fibrino Yes Wound Bed Granulation Amount: None Present (0%) Necrotic  Amount: Large (67-100%) Necrotic Quality: Eschar, Adherent Slough Treatment Notes Wound #2 (Foot) Wound Laterality: Left, Lateral Cleanser [place]ram Ancillary Kit - 15 Day Supply Discharge Instruction: Use supplies as instructed; Kit contains: (15) Saline Bullets; (15) 3x3 Gauze; 15 pr Gloves Soap and Water Discharge Instruction: Gently cleanse wound with antibacterial soap, rinse and pat dry prior to dressing wounds Peri-Wound Care Topical Rollyson, Rondi GMarland Kitchen ([id number]) Primary Dressing Hydrofera Blue Ready Transfer Foam, 2.5x2.5 (in/in) Discharge Instruction: Apply Hydrofera Blue Ready to wound bed as directed Secondary Dressing ABD Pad 5x9 (in/in) Discharge Instruction: Cover with ABD pad Secured With Medipore Tape - 20M Medipore H Soft Cloth Surgical Tape, 2x2 (in/yd) Kerlix Roll Sterile or Non-Sterile 6-ply 4.5x4 (yd/yd) Discharge Instruction: Apply Kerlix as directed Stretch Net Dressing, Latex-free, Size 5, Small-Head / Shoulder / Thigh Discharge Instruction: size 4 Compression Wrap Compression Stockings Add-Ons Electronic Signature(s) Signed: 08/15/2021 4:47:59 PM By: Levora Dredge Entered By: Levora Dredge on 08/15/2021 16:07:39 [place], [name] ([id number]) -------------------------------------------------------------------------------- Wound Assessment Details Patient Name: [name]. Date of Service: 08/15/2021 3:15 PM Medical Record Number: [id number] Patient Account Number: [id number] Date of Birth/Sex: 10-Jul-1933 (88 y.o. F) Treating RN: Levora Dredge Primary Care Fredick Schlosser: [name] Other Clinician: Referring Sava Proby: [name] Treating Aeliana Spates/Extender: Jeri Cos Weeks in Treatment: 2 Wound Status Wound Number: 3 Primary Etiology: Diabetic Wound/Ulcer of the Lower Extremity Wound Location: Left, Medial Lower Leg Wound Status: Open Wounding Event: Gradually Appeared Comorbid Hypertension, Type II Diabetes, Osteoarthritis, History:  Neuropathy Date Acquired: 06/26/2021 Weeks Of Treatment: 2 Clustered Wound: No Photos Wound Measurements Length: (cm) 2 Width: (cm) 2.2 Depth: (cm) 0.5 Area: (cm) 3.456 Volume: (cm) 1.728 % Reduction in Area: 12% % Reduction in Volume: 45% Epithelialization: None Wound Description Classification: Grade 2 Exudate Amount: Medium Exudate Type: Serosanguineous Exudate Color: red, brown Foul Odor After Cleansing: No Slough/Fibrino Yes Wound Bed Granulation Amount: Medium (34-66%) Exposed Structure Granulation Quality: Red, Pink Fat Layer (Subcutaneous Tissue) Exposed: Yes Necrotic Amount: Medium (34-66%) Necrotic Quality: Adherent Slough Treatment Notes Wound #3 (Lower Leg) Wound Laterality: Left, Medial Cleanser Byram  Ancillary Kit - 15 Day Supply Discharge Instruction: Use supplies as instructed; Kit contains: (15) Saline Bullets; (15) 3x3 Gauze; 15 pr Gloves Soap and Water Discharge Instruction: Gently cleanse wound with antibacterial soap, rinse and pat dry prior to dressing wounds Peri-Wound Care Topical Partch, Kynnadi GMarland Kitchen ([id number]) Primary Dressing Hydrofera Blue Ready Transfer Foam, 2.5x2.5 (in/in) Discharge Instruction: Apply Hydrofera Blue Ready to wound bed as directed Secondary Dressing ABD Pad 5x9 (in/in) Discharge Instruction: Cover with ABD pad Secured With Medipore Tape - 28M Medipore H Soft Cloth Surgical Tape, 2x2 (in/yd) Kerlix Roll Sterile or Non-Sterile 6-ply 4.5x4 (yd/yd) Discharge Instruction: Apply Kerlix as directed Stretch Net Dressing, Latex-free, Size 5, Small-Head / Shoulder / Thigh Discharge Instruction: size 4 Compression Wrap Compression Stockings Add-Ons Electronic Signature(s) Signed: 08/15/2021 4:47:59 PM By: Levora Dredge Entered By: Levora Dredge on 08/15/2021 16:08:22 [place], [name] ([id number]) -------------------------------------------------------------------------------- [place] Details Patient Name: [name] Date of Service: 08/15/2021 3:15 PM Medical Record Number: [id number] Patient Account Number: [id number] Date of Birth/Sex: 1933/10/13 (88 y.o. F) Treating RN: Levora Dredge Primary Care Bo Rogue: [name] Other Clinician: Referring Mykia Holton: [name] Treating Kalyani Maeda/Extender: Skipper Cliche in Treatment: 2 Vital Signs Time Taken: 15:59 Temperature (F): 97.5 Height (in): 64 Pulse (bpm): 73 Respiratory Rate (breaths/min): 18 Blood Pressure (mmHg): 146/80 Reference Range: 80 - 120 mg / dl Electronic Signature(s) Signed: 08/15/2021 4:47:59 PM By: Levora Dredge Entered By: Levora Dredge on 08/15/2021 15:59:36

## 2021-08-29 ENCOUNTER — Encounter: Payer: Medicare HMO | Admitting: Physician Assistant

## 2021-08-29 DIAGNOSIS — L97522 Non-pressure chronic ulcer of other part of left foot with fat layer exposed: Secondary | ICD-10-CM | POA: Diagnosis not present

## 2021-08-29 DIAGNOSIS — E11621 Type 2 diabetes mellitus with foot ulcer: Secondary | ICD-10-CM | POA: Diagnosis not present

## 2021-08-29 DIAGNOSIS — E1151 Type 2 diabetes mellitus with diabetic peripheral angiopathy without gangrene: Secondary | ICD-10-CM | POA: Diagnosis not present

## 2021-08-29 DIAGNOSIS — L97422 Non-pressure chronic ulcer of left heel and midfoot with fat layer exposed: Secondary | ICD-10-CM | POA: Diagnosis not present

## 2021-08-29 DIAGNOSIS — L97822 Non-pressure chronic ulcer of other part of left lower leg with fat layer exposed: Secondary | ICD-10-CM | POA: Diagnosis not present

## 2021-08-29 DIAGNOSIS — F0150 Vascular dementia without behavioral disturbance: Secondary | ICD-10-CM | POA: Diagnosis not present

## 2021-08-29 NOTE — Progress Notes (Addendum)
[name], [name] ([id number]) Visit Report for 08/29/2021 Chief Complaint Document Details Patient Name: [name], [name]. Date of Service: 08/29/2021 3:15 PM Medical Record Number: [id number] Patient Account Number: [id number] Date of Birth/Sex: [birth year] (88 y.o. F) Treating RN: Levora Dredge Primary Care Provider: [name] Other Clinician: Referring Provider: [name] Treating Provider/Extender: Skipper Cliche in Treatment: 4 Information Obtained from: Patient Chief Complaint Left LE Ulcers Electronic Signature(s) Signed: 08/29/2021 3:24:29 PM By: Worthy Keeler PA-C Entered By: Worthy Keeler on 08/29/2021 15:24:28 [name], [name] ([id number]) -------------------------------------------------------------------------------- Debridement Details Patient Name: [name] Date of Service: 08/29/2021 3:15 PM Medical Record Number: [id number] Patient Account Number: [id number] Date of Birth/Sex: 01-Jun-1933 (88 y.o. F) Treating RN: Levora Dredge Primary Care Provider: [name] Other Clinician: Referring Provider: [name] Treating Provider/Extender: Skipper Cliche in Treatment: 4 Debridement Performed for Wound #2 Left,Lateral Foot Assessment: Performed By: Physician Tommie Sams., PA-C Debridement Type: Debridement Severity of Tissue Pre Debridement: Fat layer exposed Level of Consciousness (Pre- Awake and Alert procedure): Pre-procedure Verification/Time Out Yes - 15:53 Taken: Total Area Debrided (L x W): 1.1 (cm) x 1.1 (cm) = 1.21 (cm) Tissue and other material Viable, Non-Viable, Callus, Slough, Subcutaneous, Slough debrided: Level: Skin/Subcutaneous Tissue Debridement Description: Excisional Instrument: Curette Bleeding: Minimum Hemostasis Achieved: Pressure Response to Treatment: Procedure was tolerated well Level of Consciousness (Post- Awake and Alert procedure): Post Debridement Measurements of Total  Wound Length: (cm) 1.1 Width: (cm) 1.1 Depth: (cm) 0.2 Volume: (cm) 0.19 Character of Wound/Ulcer Post Debridement: Stable Severity of Tissue Post Debridement: Fat layer exposed Post Procedure Diagnosis Same as Pre-procedure Electronic Signature(s) Signed: 08/29/2021 4:27:06 PM By: Levora Dredge Signed: 08/29/2021 5:07:46 PM By: Worthy Keeler PA-C Entered By: Levora Dredge on 08/29/2021 15:54:50 [name], [name] ([id number]) -------------------------------------------------------------------------------- Debridement Details Patient Name: [name] Date of Service: 08/29/2021 3:15 PM Medical Record Number: [id number] Patient Account Number: [id number] Date of Birth/Sex: [birth year] (88 y.o. F) Treating RN: Levora Dredge Primary Care Provider: [name] Other Clinician: Referring Provider: [name] Treating Provider/Extender: Skipper Cliche in Treatment: 4 Debridement Performed for Wound #1 Left Calcaneus Assessment: Performed By: Physician Tommie Sams., PA-C Debridement Type: Debridement Severity of Tissue Pre Debridement: Fat layer exposed Level of Consciousness (Pre- Awake and Alert procedure): Pre-procedure Verification/Time Out Yes - 15:53 Taken: Pain Control: Lidocaine 4% Topical Solution Total Area Debrided (L x W): 1 (cm) x 2 (cm) = 2 (cm) Tissue and other material Viable, Non-Viable, Callus, Slough, Subcutaneous, Slough debrided: Level: Skin/Subcutaneous Tissue Debridement Description: Excisional Instrument: Curette Bleeding: Minimum Hemostasis Achieved: Pressure Response to Treatment: Procedure was tolerated well Level of Consciousness (Post- Awake and Alert procedure): Post Debridement Measurements of Total Wound Length: (cm) 1 Width: (cm) 2 Depth: (cm) 0.1 Volume: (cm) 0.157 Character of Wound/Ulcer Post Debridement: Stable Severity of Tissue Post Debridement: Fat layer exposed Post Procedure Diagnosis Same as  Pre-procedure Electronic Signature(s) Signed: 08/29/2021 4:27:06 PM By: Levora Dredge Signed: 08/29/2021 5:07:46 PM By: Worthy Keeler PA-C Entered By: Levora Dredge on 08/29/2021 15:56:01 [name], [name] ([id number]) -------------------------------------------------------------------------------- Debridement Details Patient Name: [name] Date of Service: 08/29/2021 3:15 PM Medical Record Number: [id number] Patient Account Number: [id number] Date of Birth/Sex: [birth year] (88 y.o. F) Treating RN: Levora Dredge Primary Care Provider: [name] Other Clinician: Referring Provider: [name] Treating Provider/Extender: Skipper Cliche in Treatment: 4 Debridement Performed for Wound #3 Left,Medial Lower Leg Assessment: Performed By: Physician Tommie Sams., PA-C Debridement Type: Debridement Severity of  Tissue Pre Debridement: Fat layer exposed Level of Consciousness (Pre- Awake and Alert procedure): Pre-procedure Verification/Time Out Yes - 15:53 Taken: Total Area Debrided (L x W): 1.7 (cm) x 2.3 (cm) = 3.91 (cm) Tissue and other material Viable, Non-Viable, Slough, Subcutaneous, Slough debrided: Level: Skin/Subcutaneous Tissue Debridement Description: Excisional Instrument: Curette Bleeding: Minimum Hemostasis Achieved: Pressure Response to Treatment: Procedure was tolerated well Level of Consciousness (Post- Awake and Alert procedure): Post Debridement Measurements of Total Wound Length: (cm) 1.7 Width: (cm) 2.3 Depth: (cm) 0.5 Volume: (cm) 1.535 Character of Wound/Ulcer Post Debridement: Stable Severity of Tissue Post Debridement: Fat layer exposed Post Procedure Diagnosis Same as Pre-procedure Electronic Signature(s) Signed: 08/29/2021 4:27:06 PM By: Levora Dredge Signed: 08/29/2021 5:07:46 PM By: Worthy Keeler PA-C Entered By: Levora Dredge on 08/29/2021 15:57:35 [name], [name]  ([id number]) -------------------------------------------------------------------------------- HPI Details Patient Name: [name] Date of Service: 08/29/2021 3:15 PM Medical Record Number: [id number] Patient Account Number: [id number] Date of Birth/Sex: 08/13/1933 (88 y.o. F) Treating RN: Levora Dredge Primary Care Provider: [name] Other Clinician: Referring Provider: [name] Treating Provider/Extender: Skipper Cliche in Treatment: 4 History of Present Illness HPI Description: 08-01-2021 upon evaluation today patient appears to be doing better in regard to her wounds over the foot. This on the left foot as well as the lower extremity medially was all due to a peripheral vascular issue and arterial insufficiency. Subsequently the patient did undergo intervention in order to clear this away as far as the blockage was concerned. Dr. Delana Meyer performed an arteriogram on 06-11-2021. She had a follow-up ABI/TBI performed on 07-11-2021. This showed that there was a post intervention ABI of zero 1.09 on the left with heel monophasic today toe pressure of 0.37 but nonetheless the patient seems to be doing much better and actually appears to have the ability to heal at this time overall extremely pleased with what I am seeing and there is some need for sharp debridement today we will work on that at this point. Currently there is going to need to be some fairly aggressive debridement it does appear to be at this point. Patient has a history of diabetes mellitus type 2, peripheral vascular disease, and dementia. She is seen with her son who actually lives behind her and the patient actually lives alone but he takes good care of her and appears. 08-15-2021 upon evaluation today patient actually appears to be doing awesome in regard to her wounds. She has been tolerating the dressing changes without complication. Fortunately there does not appear to be any signs of active infection  locally or systemically at this time. No fevers, chills, nausea, vomiting, or diarrhea. 08-29-2021 upon evaluation today 08-29-2021 upon evaluation today patient appears to be doing well currently in regard to her wound. She has been tolerating the dressing changes without complication all 3 wounds are showing signs of significant improvement. Fortunately I do not see any evidence of active infection locally or systemically at this time. Electronic Signature(s) Signed: 08/29/2021 4:39:35 PM By: Worthy Keeler PA-C Entered By: Worthy Keeler on 08/29/2021 16:39:35 [name], [name] ([id number]) -------------------------------------------------------------------------------- Physical Exam Details Patient Name: [name], [name]. Date of Service: 08/29/2021 3:15 PM Medical Record Number: [id number] Patient Account Number: [id number] Date of Birth/Sex: Oct 22, 1933 (88 y.o. F) Treating RN: Levora Dredge Primary Care Provider: [name] Other Clinician: Referring Provider: [name] Treating Provider/Extender: Jeri Cos Weeks in Treatment: 4 Constitutional Well-nourished and well-hydrated in no acute distress. Respiratory normal breathing without difficulty. Psychiatric this  patient is able to make decisions and demonstrates good insight into disease process. Alert and Oriented x 3. pleasant and cooperative. Notes Upon inspection patient's wound bed showed signs of good granulation and epithelization at this point. Fortunately I do not see any evidence of infection locally or systemically which is great news and overall I am extremely pleased with where things stand. Electronic Signature(s) Signed: 08/29/2021 4:39:50 PM By: Worthy Keeler PA-C Entered By: Worthy Keeler on 08/29/2021 16:39:50 [name], [name] ([id number]) -------------------------------------------------------------------------------- Physician Orders Details Patient Name: [name] Date of Service:  08/29/2021 3:15 PM Medical Record Number: [id number] Patient Account Number: [id number] Date of Birth/Sex: 1933/12/20 (88 y.o. F) Treating RN: Levora Dredge Primary Care Provider: [name] Other Clinician: Referring Provider: [name] Treating Provider/Extender: Skipper Cliche in Treatment: 4 Verbal / Phone Orders: No Diagnosis Coding ICD-10 Coding Code Description I73.89 Other specified peripheral vascular diseases E11.621 Type 2 diabetes mellitus with foot ulcer L97.522 Non-pressure chronic ulcer of other part of left foot with fat layer exposed L97.822 Non-pressure chronic ulcer of other part of left lower leg with fat layer exposed L97.422 Non-pressure chronic ulcer of left heel and midfoot with fat layer exposed Vascular dementia, unspecified severity, without behavioral disturbance, psychotic disturbance, mood disturbance, and F01.50 anxiety Follow-up Appointments o Return Appointment in 2 weeks. o Nurse Visit as needed [employer] o Wash wounds with antibacterial soap and water. - dial soap recommended o May shower; gently cleanse wound with antibacterial soap, rinse and pat dry prior to dressing wounds o No tub bath. Edema Control - Lymphedema / Segmental Compressive Device / Other o Elevate, Exercise Daily and Avoid Standing for Long Periods of Time. o Elevate leg(s) parallel to the floor when sitting. o DO YOUR BEST to sleep in the bed at night. DO NOT sleep in your recliner. Long hours of sitting in a recliner leads to swelling of the legs and/or potential wounds on your backside. Off-Loading o Turn and reposition every 2 hours o Other: - keep heels off bed when sleeping, do best to keep pressure off heels Wound Treatment Wound #1 - Calcaneus Wound Laterality: Left Cleanser: [employer]ram Ancillary Kit - 15 Day Supply (DME) (Generic) 3 x Per Week/30 Days Discharge Instructions: Use supplies as instructed; Kit contains: (15)  Saline Bullets; (15) 3x3 Gauze; 15 pr Gloves Cleanser: Soap and Water 3 x Per Week/30 Days Discharge Instructions: Gently cleanse wound with antibacterial soap, rinse and pat dry prior to dressing wounds Primary Dressing: Hydrofera Blue Ready Transfer Foam, 2.5x2.5 (in/in) (DME) (Generic) 3 x Per Week/30 Days Discharge Instructions: Apply Hydrofera Blue Ready to wound bed as directed Secondary Dressing: ABD Pad 5x9 (in/in) (DME) (Generic) 3 x Per Week/30 Days Discharge Instructions: Cover with ABD pad Secured With: Medipore Tape - 6M Medipore H Soft Cloth Surgical Tape, 2x2 (in/yd) (DME) (Generic) 3 x Per Week/30 Days Secured With: [employer] Sterile or Non-Sterile 6-ply 4.5x4 (yd/yd) (DME) (Generic) 3 x Per Week/30 Days Discharge Instructions: Apply Kerlix as directed Secured With: [employer] Dressing, Latex-free, Size 5, Small-Head / Shoulder / Thigh 3 x Per Week/30 Days Discharge Instructions: size 4 Wound #2 - Foot Wound Laterality: Left, Lateral Cleanser: Byram Ancillary Kit - 15 Day Supply (Generic) 3 x Per Week/30 Days Dike, Amylynn G. ([id number]) Discharge Instructions: Use supplies as instructed; Kit contains: (15) Saline Bullets; (15) 3x3 Gauze; 15 pr Gloves Cleanser: Soap and Water 3 x Per Week/30 Days Discharge Instructions: Gently cleanse wound with antibacterial soap,  rinse and pat dry prior to dressing wounds Primary Dressing: Hydrofera Blue Ready Transfer Foam, 2.5x2.5 (in/in) (DME) (Generic) 3 x Per Week/30 Days Discharge Instructions: Apply Hydrofera Blue Ready to wound bed as directed Secondary Dressing: ABD Pad 5x9 (in/in) (DME) (Generic) 3 x Per Week/30 Days Discharge Instructions: Cover with ABD pad Secured With: [place] H Soft Cloth Surgical Tape, 2x2 (in/yd) (DME) (Generic) 3 x Per Week/30 Days Secured With: Kerlix Roll Sterile or Non-Sterile 6-ply 4.5x4 (yd/yd) (DME) (Generic) 3 x Per Week/30 Days Discharge Instructions: Apply Kerlix as  directed Secured With: Stretch Net Dressing, Latex-free, Size 5, Small-Head / Shoulder / Thigh 3 x Per Week/30 Days Discharge Instructions: size 4 Wound #3 - Lower Leg Wound Laterality: Left, Medial Cleanser: [employer]ram Ancillary Kit - 15 Day Supply (Generic) 3 x Per Week/30 Days Discharge Instructions: Use supplies as instructed; Kit contains: (15) Saline Bullets; (15) 3x3 Gauze; 15 pr Gloves Cleanser: Soap and Water 3 x Per Week/30 Days Discharge Instructions: Gently cleanse wound with antibacterial soap, rinse and pat dry prior to dressing wounds Primary Dressing: Hydrofera Blue Ready Transfer Foam, 2.5x2.5 (in/in) (DME) (Generic) 3 x Per Week/30 Days Discharge Instructions: Apply Hydrofera Blue Ready to wound bed as directed Secondary Dressing: ABD Pad 5x9 (in/in) (DME) (Generic) 3 x Per Week/30 Days Discharge Instructions: Cover with ABD pad Secured With: Medipore Tape - 70M Medipore H Soft Cloth Surgical Tape, 2x2 (in/yd) (DME) (Generic) 3 x Per Week/30 Days Secured With: Kerlix Roll Sterile or Non-Sterile 6-ply 4.5x4 (yd/yd) (DME) (Generic) 3 x Per Week/30 Days Discharge Instructions: Apply Kerlix as directed Secured With: Stretch Net Dressing, Latex-free, Size 5, Small-Head / Shoulder / Thigh 3 x Per Week/30 Days Discharge Instructions: size 4 Electronic Signature(s) Signed: 08/29/2021 4:27:06 PM By: Levora Dredge Signed: 08/29/2021 5:07:46 PM By: Worthy Keeler PA-C Entered By: Levora Dredge on 08/29/2021 16:08:08 [place], [name] ([id number]) -------------------------------------------------------------------------------- Problem List Details Patient Name: [name]. Date of Service: 08/29/2021 3:15 PM Medical Record Number: [id number] Patient Account Number: [id number] Date of Birth/Sex: [birth year] (88 y.o. F) Treating RN: Levora Dredge Primary Care Provider: [name] Other Clinician: Referring Provider: [name] Treating Provider/Extender: Skipper Cliche in Treatment: 4 Active Problems ICD-10 Encounter Code Description Active Date MDM Diagnosis I73.89 Other specified peripheral vascular diseases 08/01/2021 No Yes E11.621 Type 2 diabetes mellitus with foot ulcer 08/01/2021 No Yes L97.522 Non-pressure chronic ulcer of other part of left foot with fat layer 08/01/2021 No Yes exposed L97.822 Non-pressure chronic ulcer of other part of left lower leg with fat layer 08/01/2021 No Yes exposed L97.422 Non-pressure chronic ulcer of left heel and midfoot with fat layer 08/01/2021 No Yes exposed F01.50 Vascular dementia, unspecified severity, without behavioral disturbance, 08/01/2021 No Yes psychotic disturbance, mood disturbance, and anxiety Inactive Problems Resolved Problems Electronic Signature(s) Signed: 08/29/2021 3:24:26 PM By: Worthy Keeler PA-C Entered By: Worthy Keeler on 08/29/2021 15:24:26 [name], [name] ([id number]) -------------------------------------------------------------------------------- Progress Note Details Patient Name: [name] Date of Service: 08/29/2021 3:15 PM Medical Record Number: [id number] Patient Account Number: [id number] Date of Birth/Sex: 10/08/1933 (88 y.o. F) Treating RN: Levora Dredge Primary Care Provider: [name] Other Clinician: Referring Provider: [name] Treating Provider/Extender: Skipper Cliche in Treatment: 4 Subjective Chief Complaint Information obtained from Patient Left LE Ulcers History of Present Illness (HPI) 08-01-2021 upon evaluation today patient appears to be doing better in regard to her wounds over the foot. This on the left foot as well as  the lower extremity medially was all due to a peripheral vascular issue and arterial insufficiency. Subsequently the patient did undergo intervention in order to clear this away as far as the blockage was concerned. Dr. Delana Meyer performed an arteriogram on 06-11-2021. She had a follow-up ABI/TBI performed  on 07-11-2021. This showed that there was a post intervention ABI of zero 1.09 on the left with heel monophasic today toe pressure of 0.37 but nonetheless the patient seems to be doing much better and actually appears to have the ability to heal at this time overall extremely pleased with what I am seeing and there is some need for sharp debridement today we will work on that at this point. Currently there is going to need to be some fairly aggressive debridement it does appear to be at this point. Patient has a history of diabetes mellitus type 2, peripheral vascular disease, and dementia. She is seen with her son who actually lives behind her and the patient actually lives alone but he takes good care of her and appears. 08-15-2021 upon evaluation today patient actually appears to be doing awesome in regard to her wounds. She has been tolerating the dressing changes without complication. Fortunately there does not appear to be any signs of active infection locally or systemically at this time. No fevers, chills, nausea, vomiting, or diarrhea. 08-29-2021 upon evaluation today 08-29-2021 upon evaluation today patient appears to be doing well currently in regard to her wound. She has been tolerating the dressing changes without complication all 3 wounds are showing signs of significant improvement. Fortunately I do not see any evidence of active infection locally or systemically at this time. Objective Constitutional Well-nourished and well-hydrated in no acute distress. Vitals Time Taken: 3:25 PM, Height: 64 in, Temperature: 97.6 F, Pulse: 77 bpm, Respiratory Rate: 18 breaths/min, Blood Pressure: 130/73 mmHg. Respiratory normal breathing without difficulty. Psychiatric this patient is able to make decisions and demonstrates good insight into disease process. Alert and Oriented x 3. pleasant and cooperative. General Notes: Upon inspection patient's wound bed showed signs of good granulation and  epithelization at this point. Fortunately I do not see any evidence of infection locally or systemically which is great news and overall I am extremely pleased with where things stand. Integumentary (Hair, Skin) Wound #1 status is Open. Original cause of wound was Gradually Appeared. The date acquired was: 06/26/2021. The wound has been in treatment 4 weeks. The wound is located on the Left Calcaneus. The wound measures 1cm length x 2cm width x 0.1cm depth; 1.571cm^2 area and 0.157cm^3 volume. There is Fat Layer (Subcutaneous Tissue) exposed. There is no tunneling or undermining noted. There is a medium amount of serosanguineous drainage noted. There is small (1-33%) red, pink granulation within the wound bed. There is a large (67-100%) amount of necrotic tissue within the wound bed including Eschar and Adherent Slough. Wound #2 status is Open. Original cause of wound was Gradually Appeared. The date acquired was: 06/26/2021. The wound has been in treatment 4 weeks. The wound is located on the Left,Lateral Foot. The wound measures 1.1cm length x 1.1cm width x 0.2cm depth; 0.95cm^2 area and 0.19cm^3 volume. There is Fat Layer (Subcutaneous Tissue) exposed. There is no tunneling or undermining noted. There is a medium amount of serosanguineous drainage noted. There is small (1-33%) red granulation within the wound bed. There is a large (67-100%) amount of necrotic tissue within the wound bed including Eschar and Adherent Slough. [name], [name] ([id number]) Wound #3 status is Open.  Original cause of wound was Gradually Appeared. The date acquired was: 06/26/2021. The wound has been in treatment 4 weeks. The wound is located on the Left,Medial Lower Leg. The wound measures 1.7cm length x 2.3cm width x 0.5cm depth; 3.071cm^2 area and 1.535cm^3 volume. There is Fat Layer (Subcutaneous Tissue) exposed. There is no tunneling or undermining noted. There is a medium amount of serosanguineous drainage noted.  There is large (67-100%) red granulation within the wound bed. There is a small (1-33%) amount of necrotic tissue within the wound bed including Adherent Slough. Assessment Active Problems ICD-10 Other specified peripheral vascular diseases Type 2 diabetes mellitus with foot ulcer Non-pressure chronic ulcer of other part of left foot with fat layer exposed Non-pressure chronic ulcer of other part of left lower leg with fat layer exposed Non-pressure chronic ulcer of left heel and midfoot with fat layer exposed Vascular dementia, unspecified severity, without behavioral disturbance, psychotic disturbance, mood disturbance, and anxiety Procedures Wound #1 Pre-procedure diagnosis of Wound #1 is a Diabetic Wound/Ulcer of the Lower Extremity located on the Left Calcaneus .Severity of Tissue Pre Debridement is: Fat layer exposed. There was a Excisional Skin/Subcutaneous Tissue Debridement with a total area of 2 sq cm performed by Tommie Sams., PA-C. With the following instrument(s): Curette to remove Viable and Non-Viable tissue/material. Material removed includes Callus, Subcutaneous Tissue, and Slough after achieving pain control using Lidocaine 4% Topical Solution. No specimens were taken. A time out was conducted at 15:53, prior to the start of the procedure. A Minimum amount of bleeding was controlled with Pressure. The procedure was tolerated well. Post Debridement Measurements: 1cm length x 2cm width x 0.1cm depth; 0.157cm^3 volume. Character of Wound/Ulcer Post Debridement is stable. Severity of Tissue Post Debridement is: Fat layer exposed. Post procedure Diagnosis Wound #1: Same as Pre-Procedure Wound #2 Pre-procedure diagnosis of Wound #2 is a Diabetic Wound/Ulcer of the Lower Extremity located on the Left,Lateral Foot .Severity of Tissue Pre Debridement is: Fat layer exposed. There was a Excisional Skin/Subcutaneous Tissue Debridement with a total area of 1.21 sq cm performed  by Tommie Sams., PA-C. With the following instrument(s): Curette to remove Viable and Non-Viable tissue/material. Material removed includes Callus, Subcutaneous Tissue, and Slough. No specimens were taken. A time out was conducted at 15:53, prior to the start of the procedure. A Minimum amount of bleeding was controlled with Pressure. The procedure was tolerated well. Post Debridement Measurements: 1.1cm length x 1.1cm width x 0.2cm depth; 0.19cm^3 volume. Character of Wound/Ulcer Post Debridement is stable. Severity of Tissue Post Debridement is: Fat layer exposed. Post procedure Diagnosis Wound #2: Same as Pre-Procedure Wound #3 Pre-procedure diagnosis of Wound #3 is a Diabetic Wound/Ulcer of the Lower Extremity located on the Left,Medial Lower Leg .Severity of Tissue Pre Debridement is: Fat layer exposed. There was a Excisional Skin/Subcutaneous Tissue Debridement with a total area of 3.91 sq cm performed by Tommie Sams., PA-C. With the following instrument(s): Curette to remove Viable and Non-Viable tissue/material. Material removed includes Subcutaneous Tissue and Slough and. No specimens were taken. A time out was conducted at 15:53, prior to the start of the procedure. A Minimum amount of bleeding was controlled with Pressure. The procedure was tolerated well. Post Debridement Measurements: 1.7cm length x 2.3cm width x 0.5cm depth; 1.535cm^3 volume. Character of Wound/Ulcer Post Debridement is stable. Severity of Tissue Post Debridement is: Fat layer exposed. Post procedure Diagnosis Wound #3: Same as Pre-Procedure Plan Follow-up Appointments: Return Appointment in 2 weeks. Nurse Visit as  needed Bathing/ Shower/ Hygiene: Wash wounds with antibacterial soap and water. - dial soap recommended May shower; gently cleanse wound with antibacterial soap, rinse and pat dry prior to dressing wounds No tub bath. [name], [name] Darnell Level ([id number]) Edema Control - Lymphedema / Segmental  Compressive Device / Other: Elevate, Exercise Daily and Avoid Standing for Long Periods of Time. Elevate leg(s) parallel to the floor when sitting. DO YOUR BEST to sleep in the bed at night. DO NOT sleep in your recliner. Long hours of sitting in a recliner leads to swelling of the legs and/or potential wounds on your backside. Off-Loading: Turn and reposition every 2 hours Other: - keep heels off bed when sleeping, do best to keep pressure off heels WOUND #1: - Calcaneus Wound Laterality: Left Cleanser: [employer] Ancillary Kit - 15 Day Supply (DME) (Generic) 3 x Per Week/30 Days Discharge Instructions: Use supplies as instructed; Kit contains: (15) Saline Bullets; (15) 3x3 Gauze; 15 pr Gloves Cleanser: Soap and Water 3 x Per Week/30 Days Discharge Instructions: Gently cleanse wound with antibacterial soap, rinse and pat dry prior to dressing wounds Primary Dressing: Hydrofera Blue Ready Transfer Foam, 2.5x2.5 (in/in) (DME) (Generic) 3 x Per Week/30 Days Discharge Instructions: Apply Hydrofera Blue Ready to wound bed as directed Secondary Dressing: ABD Pad 5x9 (in/in) (DME) (Generic) 3 x Per Week/30 Days Discharge Instructions: Cover with ABD pad Secured With: Medipore Tape - 88M Medipore H Soft Cloth Surgical Tape, 2x2 (in/yd) (DME) (Generic) 3 x Per Week/30 Days Secured With: [employer] Sterile or Non-Sterile 6-ply 4.5x4 (yd/yd) (DME) (Generic) 3 x Per Week/30 Days Discharge Instructions: Apply Kerlix as directed Secured With: [employer] Dressing, Latex-free, Size 5, Small-Head / Shoulder / Thigh 3 x Per Week/30 Days Discharge Instructions: size 4 WOUND #2: - Foot Wound Laterality: Left, Lateral Cleanser: [employer] Ancillary Kit - 15 Day Supply (Generic) 3 x Per Week/30 Days Discharge Instructions: Use supplies as instructed; Kit contains: (15) Saline Bullets; (15) 3x3 Gauze; 15 pr Gloves Cleanser: Soap and Water 3 x Per Week/30 Days Discharge Instructions: Gently cleanse wound with  antibacterial soap, rinse and pat dry prior to dressing wounds Primary Dressing: Hydrofera Blue Ready Transfer Foam, 2.5x2.5 (in/in) (DME) (Generic) 3 x Per Week/30 Days Discharge Instructions: Apply Hydrofera Blue Ready to wound bed as directed Secondary Dressing: ABD Pad 5x9 (in/in) (DME) (Generic) 3 x Per Week/30 Days Discharge Instructions: Cover with ABD pad Secured With: Medipore Tape - 88M Medipore H Soft Cloth Surgical Tape, 2x2 (in/yd) (DME) (Generic) 3 x Per Week/30 Days Secured With: [employer] Sterile or Non-Sterile 6-ply 4.5x4 (yd/yd) (DME) (Generic) 3 x Per Week/30 Days Discharge Instructions: Apply Kerlix as directed Secured With: [employer] Dressing, Latex-free, Size 5, Small-Head / Shoulder / Thigh 3 x Per Week/30 Days Discharge Instructions: size 4 WOUND #3: - Lower Leg Wound Laterality: Left, Medial Cleanser: [employer] Ancillary Kit - 15 Day Supply (Generic) 3 x Per Week/30 Days Discharge Instructions: Use supplies as instructed; Kit contains: (15) Saline Bullets; (15) 3x3 Gauze; 15 pr Gloves Cleanser: Soap and Water 3 x Per Week/30 Days Discharge Instructions: Gently cleanse wound with antibacterial soap, rinse and pat dry prior to dressing wounds Primary Dressing: Hydrofera Blue Ready Transfer Foam, 2.5x2.5 (in/in) (DME) (Generic) 3 x Per Week/30 Days Discharge Instructions: Apply Hydrofera Blue Ready to wound bed as directed Secondary Dressing: ABD Pad 5x9 (in/in) (DME) (Generic) 3 x Per Week/30 Days Discharge Instructions: Cover with ABD pad Secured With: Medipore Tape - 88M Medipore H Soft Cloth Surgical  Tape, 2x2 (in/yd) (DME) (Generic) 3 x Per Week/30 Days Secured With: Kerlix Roll Sterile or Non-Sterile 6-ply 4.5x4 (yd/yd) (DME) (Generic) 3 x Per Week/30 Days Discharge Instructions: Apply Kerlix as directed Secured With: Stretch Net Dressing, Latex-free, Size 5, Small-Head / Shoulder / Thigh 3 x Per Week/30 Days Discharge Instructions: size 4 1. I am going to suggest  that we go ahead and continue with the wound care measures as before and the patient is in agreement with plan her son is as well this includes the use of the [facility] which I think is doing an awesome job here. 2. I am also can recommend that we have the patient continue to monitor for any signs of worsening or infection. Obviously if anything changes she should let me know. We will see patient back for reevaluation in 1 week here in the clinic. If anything worsens or changes patient will contact our office for additional recommendations. Electronic Signature(s) Signed: 08/29/2021 4:40:19 PM By: Worthy Keeler PA-C Entered By: Worthy Keeler on 08/29/2021 16:40:19 Teague, [name] ([id number]) -------------------------------------------------------------------------------- SuperBill Details Patient Name: [name] Date of Service: 08/29/2021 Medical Record Number: [id number] Patient Account Number: [id number] Date of Birth/Sex: 1933/02/15 (88 y.o. 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 L97.822 Non-pressure chronic ulcer of other part of left lower leg with fat layer exposed L97.422 Non-pressure chronic ulcer of left heel and midfoot with fat layer exposed Vascular dementia, unspecified severity, without behavioral disturbance, psychotic disturbance, mood disturbance, and F01.50 anxiety Facility Procedures CPT4 Code: 01093235 Description: 11042 - DEB SUBQ TISSUE 20 SQ CM/< Modifier: Quantity: 1 CPT4 Code: Description: ICD-10 Diagnosis Description L97.522 Non-pressure chronic ulcer of other  part of left foot with fat layer expo L97.822 Non-pressure chronic ulcer of other part of left lower leg with fat layer L97.422 Non-pressure chronic ulcer of left heel  and midfoot with fat layer expose Modifier: sed exposed [name] Quantity: Physician Procedures CPT4 Code: 5732202 Description: 11042 - WC PHYS SUBQ TISS 20 SQ CM Modifier: Quantity: 1 CPT4 Code: Description: ICD-10 Diagnosis Description L97.522 Non-pressure chronic ulcer of other part of left foot with fat layer expo L97.822 Non-pressure chronic ulcer of other part of left lower leg with fat layer L97.422 Non-pressure chronic ulcer of left heel  and midfoot with fat layer expose Modifier: sed exposed [name] Quantity: Electronic Signature(s) Signed: 08/29/2021 4:41:34 PM By: Worthy Keeler PA-C Entered By: Worthy Keeler on 08/29/2021 16:41:34

## 2021-08-29 NOTE — Progress Notes (Addendum)
PRINCETTA, UPLINGER ([id number]) Visit Report for 08/29/2021 Arrival Information Details Patient Name: [name], KOS. Date of Service: 08/29/2021 3:15 PM Medical Record Number: [id number] Patient Account Number: [id number] Date of Birth/Sex: 1933/02/25 (88 y.o. F) Treating RN: Levora Dredge Primary Care Noralee Dutko: [name] Other Clinician: Referring Tykisha Areola: [name] Treating Xhaiden Coombs/Extender: Skipper Cliche in Treatment: 4 Visit Information History Since Last Visit Added or deleted any medications: No Patient Arrived: Wheel Chair Any new allergies or adverse reactions: No Arrival Time: 15:24 Had a fall or experienced change in No Accompanied By: son activities of daily living that may affect Transfer Assistance: Manual risk of falls: Patient Identification Verified: Yes Hospitalized since last visit: No Secondary Verification Process Completed: Yes Has Dressing in Place as Prescribed: Yes Patient Has Alerts: Yes Pain Present Now: No Patient Alerts: Patient on Blood Thinner ABI 07/11/21 R 1.11 TBI 0.3 ABI 07/11/21 L 1.09 TBI 0.3 Electronic Signature(s) Signed: 08/29/2021 4:27:06 PM By: Levora Dredge Entered By: Levora Dredge on 08/29/2021 15:25:21 Kloehn, [name] ([id number]) -------------------------------------------------------------------------------- Clinic Level of Care Assessment Details Patient Name: [name] Date of Service: 08/29/2021 3:15 PM Medical Record Number: [id number] Patient Account Number: [id number] Date of Birth/Sex: [birth year] (88 y.o. F) Treating RN: Levora Dredge Primary Care Tarrance Januszewski: [name] Other Clinician: Referring Bernardine Langworthy: [name] Treating Estevan Kersh/Extender: Skipper Cliche in Treatment: 4 Clinic Level of Care Assessment Items TOOL 1 Quantity Score _0  - Use when EandM and Procedure is performed on INITIAL visit 0 ASSESSMENTS - Nursing Assessment / Reassessment _1  - General Physical Exam  (combine w/ comprehensive assessment (listed just below) when performed on new 0 pt. evals) _2  - 0 Comprehensive Assessment (HX, ROS, Risk Assessments, Wounds Hx, etc.) ASSESSMENTS - Wound and Skin Assessment / Reassessment _3  - Dermatologic / Skin Assessment (not related to wound area) 0 ASSESSMENTS - Ostomy and/or Continence Assessment and Care _4  - Incontinence Assessment and Management 0 _5  - 0 Ostomy Care Assessment and Management (repouching, etc.) PROCESS - Coordination of Care _6  - Simple Patient / Family Education for ongoing care 0 _7  - 0 Complex (extensive) Patient / Family Education for ongoing care _8  - 0 Staff obtains Programmer, systems, Records, Test Results / Process Orders _9  - 0 Staff telephones HHA, Nursing Homes / Clarify orders / etc _10  - 0 Routine Transfer to another Facility (non-emergent condition) _11  - 0 Routine Hospital Admission (non-emergent condition) _12  - 0 New Admissions / Biomedical engineer / Ordering NPWT, Apligraf, etc. _13  - 0 Emergency Hospital Admission (emergent condition) PROCESS - Special Needs _14  - Pediatric / Minor Patient Management 0 _15  - 0 Isolation Patient Management _16  - 0 Hearing / Language / Visual special needs _17  - 0 Assessment of Community assistance (transportation, D/C planning, etc.) _18  - 0 Additional assistance / Altered mentation _19  - 0 Support Surface(s) Assessment (bed, cushion, seat, etc.) INTERVENTIONS - Miscellaneous _20  - External ear exam 0 _21  - 0 Patient Transfer (multiple staff / Civil Service fast streamer / Similar devices) _22  - 0 Simple Staple / Suture removal (25 or less) _23  - 0 Complex Staple / Suture removal (26 or more) _24  - 0 Hypo/Hyperglycemic Management (do not check if billed separately) _25  - 0 Ankle / Brachial Index (ABI) - do not check if billed separately Has the patient been seen at the hospital within the last three years: Yes Total Score: 0 Level Of Care: ____ [name] ([id number]) Electronic  Signature(s) Signed: 08/29/2021 4:27:06 PM By: Levora Dredge Entered By: Levora Dredge on 08/29/2021 16:07:12  Roepke, [name] ([id number]) -------------------------------------------------------------------------------- Encounter Discharge Information Details Patient Name: [name], [name]. Date of Service: 08/29/2021 3:15 PM Medical Record Number: [id number] Patient Account Number: [id number] Date of Birth/Sex: 30-Dec-1933 (88 y.o. F) Treating RN: Levora Dredge Primary Care Nita Whitmire: [name] Other Clinician: Referring Brittnei Jagiello: [name] Treating Gaylord Seydel/Extender: Skipper Cliche in Treatment: 4 Encounter Discharge Information Items Post Procedure Vitals Discharge Condition: Stable Temperature (F): 97.6 Ambulatory Status: Wheelchair Pulse (bpm): 77 Discharge Destination: Home Respiratory Rate (breaths/min): 18 Transportation: Private Auto Blood Pressure (mmHg): 130/73 Accompanied By: son Schedule Follow-up Appointment: Yes Clinical Summary of Care: Electronic Signature(s) Signed: 08/29/2021 4:27:06 PM By: Levora Dredge Entered By: Levora Dredge on 08/29/2021 16:08:43 [place]wood, [name] ([id number]) -------------------------------------------------------------------------------- Lower Extremity Assessment Details Patient Name: [name]. Date of Service: 08/29/2021 3:15 PM Medical Record Number: [id number] Patient Account Number: [id number] Date of Birth/Sex: 1933/12/08 (88 y.o. F) Treating RN: Levora Dredge Primary Care Tymothy Cass: [name] Other Clinician: Referring Itzell Bendavid: [name] Treating Shanna Un/Extender: Jeri Cos Weeks in Treatment: 4 Edema Assessment Assessed: [Left: No] [Right: No] Edema: [Left: Ye] [Right: s] Calf Left: Right: Point of Measurement: 31 cm From Medial Instep 34 cm Ankle Left: Right: Point of Measurement: 9 cm From Medial Instep 23 cm Vascular Assessment Pulses: Dorsalis Pedis Palpable:  [Left:Yes] Electronic Signature(s) Signed: 08/29/2021 4:27:06 PM By: Levora Dredge Entered By: Levora Dredge on 08/29/2021 15:35:50 [place], [name] ([id number]) -------------------------------------------------------------------------------- Multi Wound Chart Details Patient Name: [name] Date of Service: 08/29/2021 3:15 PM Medical Record Number: [id number] Patient Account Number: [id number] Date of Birth/Sex: 20-May-1933 (88 y.o. 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 Extremity Extremity Comorbid History: Hypertension, Type II Diabetes, Hypertension, Type II Diabetes, Hypertension, Type II Diabetes, Osteoarthritis, Neuropathy Osteoarthritis, Neuropathy Osteoarthritis, Neuropathy Date Acquired: 06/26/2021 06/26/2021 06/26/2021 Weeks of Treatment: _0 Wound Status: Open Open Open Wound Recurrence: No No No Measurements L x W x D (cm) 1x2x0.1 1.1x1.1x0.2 1.7x2.3x0.5 Area (cm) : 1.571 0.95 3.071 Volume (cm) : 0.157 0.19 1.535 % Reduction in Area: 93.20% 55.20% 21.80% % Reduction in Volume: 98.30% 10.40% 51.10% Classification: Grade 2 Grade 2 Grade 2 Exudate Amount: Medium Medium Medium Exudate Type: Serosanguineous Serosanguineous Serosanguineous Exudate Color: red, brown red, brown red, brown Granulation Amount: Small (1-33%) Small (1-33%) Large (67-100%) Granulation Quality:  Red, Pink Red Red Necrotic Amount: Large (67-100%) Large (67-100%) Small (1-33%) Necrotic Tissue: Eschar, Adherent [place] Exposed Structures: Fat Layer (Subcutaneous Tissue): Fat Layer (Subcutaneous Tissue): Fat Layer (Subcutaneous Tissue): Yes Yes Yes Epithelialization: None None None Treatment Notes Electronic Signature(s) Signed: 08/29/2021 4:27:06 PM By: Levora Dredge Entered By: Levora Dredge on 08/29/2021 15:51:55 Bissonnette, [name] ([id number]) -------------------------------------------------------------------------------- [place] Details Patient Name: [name]. Date of Service: 08/29/2021 3:15 PM Medical Record Number: [id number] Patient Account Number: [id number] Date of Birth/Sex: Dec 25, 1933 (88 y.o. F) Treating RN: Levora Dredge Primary Care Ieisha Gao: [name] Other Clinician: Referring Redford Behrle: [name] Treating Floyed Masoud/Extender: Skipper Cliche in Treatment: 4 Active Inactive Wound/Skin Impairment Nursing Diagnoses: Impaired tissue integrity Knowledge deficit related to ulceration/compromised  skin integrity Goals: Ulcer/skin breakdown will have a volume reduction of 30% by week 4 Date Initiated: 08/01/2021 Target Resolution Date: 08/29/2021 Goal Status: Active Ulcer/skin breakdown will have a volume reduction of 50% by week 8 Date Initiated: 08/01/2021 Target Resolution Date: 09/26/2021 Goal Status: Active Ulcer/skin breakdown will have a volume reduction of 80% by week 12 Date Initiated: 08/01/2021 Target Resolution Date: 10/24/2021 Goal Status: Active Ulcer/skin breakdown will heal within 14 weeks Date Initiated: 08/01/2021 Target Resolution Date: 11/07/2021 Goal Status: Active Interventions: Assess patient/caregiver ability to obtain necessary supplies Assess patient/caregiver ability to perform ulcer/skin care regimen upon admission and as needed Assess ulceration(s)  every visit Provide education on ulcer and skin care Notes: Electronic Signature(s) Signed: 08/29/2021 4:27:06 PM By: Levora Dredge Entered By: Levora Dredge on 08/29/2021 15:51:47 Fowles, [name] ([id number]) -------------------------------------------------------------------------------- Pain Assessment Details Patient Name: [name] Date of Service: 08/29/2021 3:15 PM Medical Record Number: [id number] Patient Account Number: [id number] Date of Birth/Sex: 19-Nov-1933 (88 y.o. F) Treating RN: Levora Dredge Primary Care Sadiel Mota: [name] Other Clinician: Referring Zoeya Gramajo: [name] Treating Tranice Laduke/Extender: Skipper Cliche in Treatment: 4 Active Problems Location of Pain Severity and Description of Pain Patient Has Paino No Site Locations Rate the pain. Current Pain Level: 0 Pain Management and Medication Current Pain Management: Electronic Signature(s) Signed: 08/29/2021 4:27:06 PM By: Levora Dredge Entered By: Levora Dredge on 08/29/2021 15:26:41 [place], [name] ([id number]) -------------------------------------------------------------------------------- Patient/Caregiver Education Details Patient Name: [name] Date of Service: 08/29/2021 3:15 PM Medical Record Number: [id number] Patient Account Number: [id number] Date of Birth/Gender: 1933/03/03 (88 y.o. F) Treating RN: Levora Dredge Primary Care Physician: [name] Other Clinician: Referring Physician: [name] Treating Physician/Extender: Skipper Cliche in Treatment: 4 Education Assessment Education Provided To: Patient and Caregiver Education Topics Provided Wound Debridement: Handouts: Wound Debridement Methods: Explain/Verbal Responses: State content correctly Wound/Skin Impairment: Handouts: Caring for Your Ulcer Methods: Explain/Verbal Responses: State content correctly Electronic Signature(s) Signed: 08/29/2021 4:27:06 PM By: Levora Dredge Entered By: Levora Dredge on 08/29/2021 16:07:33 Dare, [name] ([id number]) -------------------------------------------------------------------------------- Wound Assessment Details Patient Name: [name]. Date of Service: 08/29/2021 3:15 PM Medical Record Number: [id number] Patient Account Number: [id number] Date of Birth/Sex: [birth year] (88 y.o. F) Treating RN: Levora Dredge Primary Care Janai Maudlin: [name] Other Clinician: Referring Cydnee Fuquay: [name] Treating Jalea Bronaugh/Extender: Skipper Cliche in Treatment: 4 Wound Status Wound Number: 1 Primary Etiology: Diabetic Wound/Ulcer of the Lower Extremity Wound Location: Left Calcaneus Wound Status: Open Wounding Event: Gradually Appeared Comorbid Hypertension, Type II Diabetes, Osteoarthritis, History: Neuropathy Date Acquired: 06/26/2021 Weeks Of Treatment: 4 Clustered Wound: No Photos Wound Measurements Length: (cm) 1 Width: (cm) 2 Depth: (cm) 0.1 Area: (cm) 1.571 Volume: (cm) 0.157 % Reduction in Area: 93.2% % Reduction in Volume: 98.3% Epithelialization: None Tunneling: No Undermining: No Wound Description Classification: Grade 2 Exudate Amount: Medium Exudate Type: Serosanguineous Exudate Color: red, brown Foul Odor After Cleansing: No Slough/Fibrino Yes Wound Bed Granulation Amount: Small (1-33%) Exposed Structure Granulation Quality: Red, Pink Fat Layer (Subcutaneous Tissue) Exposed: Yes Necrotic Amount: Large (67-100%) Necrotic Quality: Eschar, Adherent Slough Treatment Notes Wound #1 (Calcaneus) Wound Laterality: Left Cleanser Byram Ancillary Kit - 15 Day Supply Discharge Instruction: Use supplies as instructed; Kit contains: (15) Saline Bullets; (15) 3x3 Gauze; 15 pr Gloves Soap and Water Discharge Instruction: Gently cleanse wound with antibacterial soap, rinse and pat dry prior to dressing wounds Peri-Wound Care Topical Frink, Anahis G. ([id number]) Primary  Dressing Hydrofera Blue Ready Transfer Foam, 2.5x2.5 (in/in) Discharge  Instruction: Apply Hydrofera Blue Ready to wound bed as directed Secondary Dressing ABD Pad 5x9 (in/in) Discharge Instruction: Cover with ABD pad Secured With Quinlan Surgical Tape, 2x2 (in/yd) Kerlix Roll Sterile or Non-Sterile 6-ply 4.5x4 (yd/yd) Discharge Instruction: Apply Kerlix as directed Stretch Net Dressing, Latex-free, Size 5, Small-Head / Shoulder / Thigh Discharge Instruction: size 4 Compression Wrap Compression Stockings Add-Ons Electronic Signature(s) Signed: 08/29/2021 4:27:06 PM By: Levora Dredge Entered By: Levora Dredge on 08/29/2021 15:34:22 [place], [name] ([id number]) -------------------------------------------------------------------------------- Wound Assessment Details Patient Name: [name]. Date of Service: 08/29/2021 3:15 PM Medical Record Number: [id number] Patient Account Number: [id number] Date of Birth/Sex: 16-May-1933 (88 y.o. F) Treating RN: Levora Dredge Primary Care Leniya Breit: [name] Other Clinician: Referring Dannell Gortney: [name] Treating Archimedes Harold/Extender: Skipper Cliche in Treatment: 4 Wound Status Wound Number: 2 Primary Etiology: Diabetic Wound/Ulcer of the Lower Extremity Wound Location: Left, Lateral Foot Wound Status: Open Wounding Event: Gradually Appeared Comorbid Hypertension, Type II Diabetes, Osteoarthritis, History: Neuropathy Date Acquired: 06/26/2021 Weeks Of Treatment: 4 Clustered Wound: No Photos Wound Measurements Length: (cm) 1.1 Width: (cm) 1.1 Depth: (cm) 0.2 Area: (cm) 0.95 Volume: (cm) 0.19 % Reduction in Area: 55.2% % Reduction in Volume: 10.4% Epithelialization: None Tunneling: No Undermining: No Wound Description Classification: Grade 2 Exudate Amount: Medium Exudate Type: Serosanguineous Exudate Color: red, brown Foul Odor After Cleansing: No Slough/Fibrino Yes Wound  Bed Granulation Amount: Small (1-33%) Exposed Structure Granulation Quality: Red Fat Layer (Subcutaneous Tissue) Exposed: Yes Necrotic Amount: Large (67-100%) Necrotic Quality: Eschar, Adherent Slough Treatment Notes Wound #2 (Foot) Wound Laterality: Left, Lateral Cleanser Byram Ancillary Kit - 15 Day Supply Discharge Instruction: Use supplies as instructed; Kit contains: (15) Saline Bullets; (15) 3x3 Gauze; 15 pr Gloves Soap and Water Discharge Instruction: Gently cleanse wound with antibacterial soap, rinse and pat dry prior to dressing wounds Peri-Wound Care Topical Wind, Lachele GMarland Kitchen ([id number]) Primary Dressing Hydrofera Blue Ready Transfer Foam, 2.5x2.5 (in/in) Discharge Instruction: Apply Hydrofera Blue Ready to wound bed as directed Secondary Dressing ABD Pad 5x9 (in/in) Discharge Instruction: Cover with ABD pad Secured With Medipore Tape - 4M Medipore H Soft Cloth Surgical Tape, 2x2 (in/yd) Kerlix Roll Sterile or Non-Sterile 6-ply 4.5x4 (yd/yd) Discharge Instruction: Apply Kerlix as directed Stretch Net Dressing, Latex-free, Size 5, Small-Head / Shoulder / Thigh Discharge Instruction: size 4 Compression Wrap Compression Stockings Add-Ons Electronic Signature(s) Signed: 08/29/2021 4:27:06 PM By: Levora Dredge Entered By: Levora Dredge on 08/29/2021 15:34:57 [place], [name] ([id number]) -------------------------------------------------------------------------------- Wound Assessment Details Patient Name: [name]. Date of Service: 08/29/2021 3:15 PM Medical Record Number: [id number] Patient Account Number: [id number] Date of Birth/Sex: [birth year] (88 y.o. F) Treating RN: Levora Dredge Primary Care Mahogony Gilchrest: [name] Other Clinician: Referring Dontavion Noxon: [name] Treating Ethyle Tiedt/Extender: Skipper Cliche in Treatment: 4 Wound Status Wound Number: 3 Primary Etiology: Diabetic Wound/Ulcer of the Lower Extremity Wound Location: Left,  Medial Lower Leg Wound Status: Open Wounding Event: Gradually Appeared Comorbid Hypertension, Type II Diabetes, Osteoarthritis, History: Neuropathy Date Acquired: 06/26/2021 Weeks Of Treatment: 4 Clustered Wound: No Photos Wound Measurements Length: (cm) 1.7 Width: (cm) 2.3 Depth: (cm) 0.5 Area: (cm) 3.071 Volume: (cm) 1.535 % Reduction in Area: 21.8% % Reduction in Volume: 51.1% Epithelialization: None Tunneling: No Undermining: No Wound Description Classification: Grade 2 Exudate Amount: Medium Exudate Type: Serosanguineous Exudate Color: red, brown Foul Odor After Cleansing: No Slough/Fibrino Yes Wound Bed Granulation Amount: Large (67-100%) Exposed Structure Granulation Quality: Red Fat Layer (Subcutaneous Tissue) Exposed: Yes  Necrotic Amount: Small (1-33%) Necrotic Quality: Adherent Slough Treatment Notes Wound #3 (Lower Leg) Wound Laterality: Left, Medial Cleanser Byram Ancillary Kit - 15 Day Supply Discharge Instruction: Use supplies as instructed; Kit contains: (15) Saline Bullets; (15) 3x3 Gauze; 15 pr Gloves Soap and Water Discharge Instruction: Gently cleanse wound with antibacterial soap, rinse and pat dry prior to dressing wounds Peri-Wound Care Topical Blume, Karagan GMarland Kitchen ([id number]) Primary Dressing Hydrofera Blue Ready Transfer Foam, 2.5x2.5 (in/in) Discharge Instruction: Apply Hydrofera Blue Ready to wound bed as directed Secondary Dressing ABD Pad 5x9 (in/in) Discharge Instruction: Cover with ABD pad Secured With Medipore Tape - 42M Medipore H Soft Cloth Surgical Tape, 2x2 (in/yd) Kerlix Roll Sterile or Non-Sterile 6-ply 4.5x4 (yd/yd) Discharge Instruction: Apply Kerlix as directed Stretch Net Dressing, Latex-free, Size 5, Small-Head / Shoulder / Thigh Discharge Instruction: size 4 Compression Wrap Compression Stockings Add-Ons Electronic Signature(s) Signed: 08/29/2021 4:27:06 PM By: Levora Dredge Entered By: Levora Dredge on 08/29/2021  15:35:31 [place], [name] ([id number]) -------------------------------------------------------------------------------- Vitals Details Patient Name: [name] Date of Service: 08/29/2021 3:15 PM Medical Record Number: [id number] Patient Account Number: [id number] Date of Birth/Sex: [birth year] (88 y.o. F) Treating RN: Levora Dredge Primary Care Mylin Gignac: [name] Other Clinician: Referring Lois Ostrom: [name] Treating Ritvik Mczeal/Extender: Skipper Cliche in Treatment: 4 Vital Signs Time Taken: 15:25 Temperature (F): 97.6 Height (in): 64 Pulse (bpm): 77 Respiratory Rate (breaths/min): 18 Blood Pressure (mmHg): 130/73 Reference Range: 80 - 120 mg / dl Electronic Signature(s) Signed: 08/29/2021 4:27:06 PM By: Levora Dredge Entered By: Levora Dredge on 08/29/2021 15:26:34

## 2021-09-02 DIAGNOSIS — I7389 Other specified peripheral vascular diseases: Secondary | ICD-10-CM | POA: Diagnosis not present

## 2021-09-02 DIAGNOSIS — L97822 Non-pressure chronic ulcer of other part of left lower leg with fat layer exposed: Secondary | ICD-10-CM | POA: Diagnosis not present

## 2021-09-02 DIAGNOSIS — L97422 Non-pressure chronic ulcer of left heel and midfoot with fat layer exposed: Secondary | ICD-10-CM | POA: Diagnosis not present

## 2021-09-02 DIAGNOSIS — E11621 Type 2 diabetes mellitus with foot ulcer: Secondary | ICD-10-CM | POA: Diagnosis not present

## 2021-09-12 ENCOUNTER — Encounter: Payer: Medicare HMO | Attending: Physician Assistant | Admitting: Physician Assistant

## 2021-09-12 DIAGNOSIS — L97822 Non-pressure chronic ulcer of other part of left lower leg with fat layer exposed: Secondary | ICD-10-CM | POA: Diagnosis not present

## 2021-09-12 DIAGNOSIS — F0154 Vascular dementia, unspecified severity, with anxiety: Secondary | ICD-10-CM | POA: Insufficient documentation

## 2021-09-12 DIAGNOSIS — L97522 Non-pressure chronic ulcer of other part of left foot with fat layer exposed: Secondary | ICD-10-CM | POA: Insufficient documentation

## 2021-09-12 DIAGNOSIS — L97422 Non-pressure chronic ulcer of left heel and midfoot with fat layer exposed: Secondary | ICD-10-CM | POA: Diagnosis not present

## 2021-09-12 DIAGNOSIS — E1151 Type 2 diabetes mellitus with diabetic peripheral angiopathy without gangrene: Secondary | ICD-10-CM | POA: Diagnosis not present

## 2021-09-12 DIAGNOSIS — E11621 Type 2 diabetes mellitus with foot ulcer: Secondary | ICD-10-CM | POA: Insufficient documentation

## 2021-09-12 DIAGNOSIS — I7389 Other specified peripheral vascular diseases: Secondary | ICD-10-CM | POA: Diagnosis present

## 2021-09-12 NOTE — Progress Notes (Signed)
[name], [name] ([id number]) Visit Report for 09/12/2021 Arrival Information Details Patient Name: [name], [name]. Date of Service: 09/12/2021 2:15 PM Medical Record Number: [id number] Patient Account Number: [id number] Date of Birth/Sex: 1934-02-08 (88 y.o. F) Treating RN: Levora Dredge Primary Care Lilton Pare: [name] Referring Jamiah Recore: [name] Treating Jacarra Bobak/Extender: Skipper Cliche in Treatment: 6 Visit Information History Since Last Visit Added or deleted any medications: No Patient Arrived: Wheel Chair Any new allergies or adverse reactions: No Arrival Time: 14:16 Had a fall or experienced change in No Accompanied By: son activities of daily living that may affect Transfer Assistance: Other risk of falls: Patient Identification Verified: Yes Hospitalized since last visit: No Secondary Verification Process Completed: Yes Has Dressing in Place as Prescribed: Yes Patient Has Alerts: Yes Pain Present Now: No Patient Alerts: Patient on Blood Thinner ABI 07/11/21 R 1.11 TBI 0.3 ABI 07/11/21 L 1.09 TBI 0.3 Notes pt asymptomatic Electronic Signature(s) Signed: 09/12/2021 4:10:56 PM By: Levora Dredge Entered By: Levora Dredge on 09/12/2021 15:00:37 [place]ing, [name] ([id number]) -------------------------------------------------------------------------------- Clinic Level of Care Assessment Details Patient Name: [name] Date of Service: 09/12/2021 2:15 PM Medical Record Number: [id number] Patient Account Number: [id number] Date of Birth/Sex: [birth year] (88 y.o. F) Treating RN: Levora Dredge Primary Care Francetta Ilg: [name] Referring Jamael Hoffmann: [name] Treating Severin Bou/Extender: Skipper Cliche in Treatment: 6 Clinic Level of Care Assessment Items TOOL 1 Quantity Score []  - Use when EandM and Procedure is performed on INITIAL visit 0 ASSESSMENTS - Nursing  Assessment / Reassessment []  - General Physical Exam (combine w/ comprehensive assessment (listed just below) when performed on new 0 pt. evals) []  - 0 Comprehensive Assessment (HX, ROS, Risk Assessments, Wounds Hx, etc.) ASSESSMENTS - Wound and Skin Assessment / Reassessment []  - Dermatologic / Skin Assessment (not related to wound area) 0 ASSESSMENTS - Ostomy and/or Continence Assessment and Care []  - Incontinence Assessment and Management 0 []  - 0 Ostomy Care Assessment and Management (repouching, etc.) PROCESS - Coordination of Care []  - Simple Patient / Family Education for ongoing care 0 []  - 0 Complex (extensive) Patient / Family Education for ongoing care []  - 0 Staff obtains Programmer, systems, Records, Test Results / Process Orders []  - 0 Staff telephones HHA, Nursing Homes / Clarify orders / etc []  - 0 Routine Transfer to another Facility (non-emergent condition) []  - 0 Routine Hospital Admission (non-emergent condition) []  - 0 New Admissions / Biomedical engineer / Ordering NPWT, Apligraf, etc. []  - 0 Emergency Hospital Admission (emergent condition) PROCESS - Special Needs []  - Pediatric / Minor Patient Management 0 []  - 0 Isolation Patient Management []  - 0 Hearing / Language / Visual special needs []  - 0 Assessment of Community assistance (transportation, D/C planning, etc.) []  - 0 Additional assistance / Altered mentation []  - 0 Support Surface(s) Assessment (bed, cushion, seat, etc.) INTERVENTIONS - Miscellaneous []  - External ear exam 0 []  - 0 Patient Transfer (multiple staff / Civil Service fast streamer / Similar devices) []  - 0 Simple Staple / Suture removal (25 or less) []  - 0 Complex Staple / Suture removal (26 or more) []  - 0 Hypo/Hyperglycemic Management (do not check if billed separately) []  - 0 Ankle / Brachial Index (ABI) - do not check if billed separately Has the patient been seen at the hospital within the last three years: Yes Total Score: 0 Level Of  Care: ____ [name] ([id number]) Electronic Signature(s) Signed: 09/12/2021 4:10:56 PM By: Levora Dredge  Entered By: Levora Dredge on 09/12/2021 14:59:08 Dowlen, [name] ([id number]) -------------------------------------------------------------------------------- Encounter Discharge Information Details Patient Name: [name]. Date of Service: 09/12/2021 2:15 PM Medical Record Number: [id number] Patient Account Number: [id number] Date of Birth/Sex: 1933-03-10 (88 y.o. F) Treating RN: Levora Dredge Primary Care Jazalyn Mondor: [name] Referring Chrisangel Eskenazi: [name] Treating Sherma Vanmetre/Extender: Skipper Cliche in Treatment: 6 Encounter Discharge Information Items Post Procedure Vitals Discharge Condition: Stable Temperature (F): 97.7 Ambulatory Status: Wheelchair Pulse (bpm): 70 Discharge Destination: Home Respiratory Rate (breaths/min): 18 Transportation: Private Auto Blood Pressure (mmHg): 188/70 Accompanied By: son Schedule Follow-up Appointment: Yes Clinical Summary of Care: Electronic Signature(s) Signed: 09/12/2021 4:10:56 PM By: Levora Dredge Entered By: Levora Dredge on 09/12/2021 15:00:22 [place], [name] ([id number]) -------------------------------------------------------------------------------- Lower Extremity Assessment Details Patient Name: [name]. Date of Service: 09/12/2021 2:15 PM Medical Record Number: [id number] Patient Account Number: [id number] Date of Birth/Sex: Apr 16, 1933 (88 y.o. F) Treating RN: Levora Dredge Primary Care Cosby Proby: [name] Referring Morena Mckissack: [name] Treating Carrington Olazabal/Extender: Jeri Cos Weeks in Treatment: 6 Edema Assessment Assessed: [Left: No] [Right: No] Edema: [Left: Ye] [Right: s] Electronic Signature(s) Signed: 09/12/2021 4:10:56 PM By: Levora Dredge Entered By: Levora Dredge on 09/12/2021  14:31:25 Idalia, [name] ([id number]) -------------------------------------------------------------------------------- Multi Wound Chart Details Patient Name: [name] Date of Service: 09/12/2021 2:15 PM Medical Record Number: [id number] Patient Account Number: [id number] Date of Birth/Sex: 07-17-1933 (88 y.o. 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 Extremity Extremity Comorbid History: Hypertension, Type II Diabetes, Hypertension, Type II Diabetes, Hypertension, Type II Diabetes, Osteoarthritis, Neuropathy Osteoarthritis, Neuropathy Osteoarthritis, Neuropathy Date Acquired: 06/26/2021 06/26/2021 06/26/2021 Weeks of Treatment: 6 6 6  Wound Status: Open Open Open Wound Recurrence: No No No Measurements L x W x D (cm) 0.5x0.6x0.2 1.2x1x0.2 1.7x1.9x0.3 Area (cm) : 0.236 0.942 2.537 Volume (cm) : 0.047 0.188 0.761 % Reduction in Area: 99.00% 55.60% 35.40% % Reduction in Volume: 99.50% 11.30% 75.80% Classification: Grade 2 Grade 2 Grade 2 Exudate Amount: Medium Medium Medium Exudate Type: Serosanguineous Serosanguineous Serosanguineous Exudate Color: red, brown red, brown red, brown Granulation Amount: Large (67-100%) Medium (34-66%) Medium (34-66%) Granulation Quality: Red Red Red Necrotic Amount: Small (1-33%) Medium (34-66%) Medium (34-66%) Exposed Structures: Fat Layer  (Subcutaneous Tissue): Fat Layer (Subcutaneous Tissue): Fat Layer (Subcutaneous Tissue): Yes Yes Yes Epithelialization: None None None Treatment Notes Electronic Signature(s) Signed: 09/12/2021 4:10:56 PM By: Levora Dredge Entered By: Levora Dredge on 09/12/2021 14:46:28 Dralle, [name] ([id number]) -------------------------------------------------------------------------------- [place] Details Patient Name: [name]. Date of Service: 09/12/2021 2:15 PM Medical Record Number: [id number] Patient Account Number: [id number] Date of Birth/Sex: August 10, 1933 (88 y.o. F) Treating RN: Levora Dredge Primary Care Shree Espey: [name] Referring Westin Knotts: [name] Treating Insiya Oshea/Extender: Skipper Cliche in Treatment: 6 Active Inactive Wound/Skin Impairment Nursing Diagnoses: Impaired tissue integrity Knowledge deficit related to ulceration/compromised skin integrity Goals: Ulcer/skin breakdown will have a volume reduction of 30% by week 4 Date Initiated: 08/01/2021 Target Resolution Date: 08/29/2021 Goal Status: Active Ulcer/skin breakdown will have  a volume reduction of 50% by week 8 Date Initiated: 08/01/2021 Target Resolution Date: 09/26/2021 Goal Status: Active Ulcer/skin breakdown will have a volume reduction of 80% by week 12 Date Initiated: 08/01/2021 Target Resolution Date: 10/24/2021 Goal Status: Active Ulcer/skin breakdown will heal within 14 weeks Date Initiated: 08/01/2021 Target Resolution Date: 11/07/2021 Goal Status: Active Interventions: Assess patient/caregiver ability to obtain necessary supplies Assess patient/caregiver ability to perform ulcer/skin care regimen upon admission and as needed Assess ulceration(s) every visit Provide education on ulcer and skin care Notes: Electronic Signature(s) Signed: 09/12/2021 4:10:56 PM By: Levora Dredge Entered By: Levora Dredge on 09/12/2021  14:46:17 Degregory, [name] ([id number]) -------------------------------------------------------------------------------- Pain Assessment Details Patient Name: [name] Date of Service: 09/12/2021 2:15 PM Medical Record Number: [id number] Patient Account Number: [id number] Date of Birth/Sex: 1933-06-05 (88 y.o. F) Treating RN: Levora Dredge Primary Care Milanni Ayub: [name] Referring Jeda Pardue: [name] Treating Isabelly Kobler/Extender: Skipper Cliche in Treatment: 6 Active Problems Location of Pain Severity and Description of Pain Patient Has Paino No Site Locations Rate the pain. Current Pain Level: 0 Pain Management and Medication Current Pain Management: Electronic Signature(s) Signed: 09/12/2021 4:10:56 PM By: Levora Dredge Entered By: Levora Dredge on 09/12/2021 14:20:59 Risser, [name] ([id number]) -------------------------------------------------------------------------------- Patient/Caregiver Education Details Patient Name: [name] Date of Service: 09/12/2021 2:15 PM Medical Record Number: [id number] Patient Account Number: [id number] Date of Birth/Gender: Jul 11, 1933 (88 y.o. F) Treating RN: Levora Dredge Primary Care Physician: [name] Referring Physician: [name] Treating Physician/Extender: Skipper Cliche in Treatment: 6 Education Assessment Education Provided To: Patient Education Topics Provided Wound Debridement: Handouts: Wound Debridement Methods: Explain/Verbal Responses: State content correctly Wound/Skin Impairment: Handouts: Caring for Your Ulcer Methods: Explain/Verbal Responses: State content correctly Electronic Signature(s) Signed: 09/12/2021 4:10:56 PM By: Levora Dredge Entered By: Levora Dredge on 09/12/2021 14:59:28 Heinrichs, [name]  ([id number]) -------------------------------------------------------------------------------- Wound Assessment Details Patient Name: [name]. Date of Service: 09/12/2021 2:15 PM Medical Record Number: [id number] Patient Account Number: [id number] Date of Birth/Sex: January 14, 1934 (88 y.o. F) Treating RN: Levora Dredge Primary Care Izamar Linden: [name] Referring Leng Montesdeoca: [name] Treating Kobyn Kray/Extender: Jeri Cos Weeks in Treatment: 6 Wound Status Wound Number: 1 Primary Etiology: Diabetic Wound/Ulcer of the Lower Extremity Wound Location: Left Calcaneus Wound Status: Open Wounding Event: Gradually Appeared Comorbid Hypertension, Type II Diabetes, Osteoarthritis, History: Neuropathy Date Acquired: 06/26/2021 Weeks Of Treatment: 6 Clustered Wound: No Photos Wound Measurements Length: (cm) 0.5 Width: (cm) 0.6 Depth: (cm) 0.2 Area: (cm) 0.236 Volume: (cm) 0.047 % Reduction in Area: 99% % Reduction in Volume: 99.5% Epithelialization: None Tunneling: No Undermining: No Wound Description Classification: Grade 2 Exudate Amount: Medium Exudate Type: Serosanguineous Exudate Color: red, brown Foul Odor After Cleansing: No Slough/Fibrino Yes Wound Bed Granulation Amount: Large (67-100%) Exposed Structure Granulation Quality: Red Fat Layer (Subcutaneous Tissue) Exposed: Yes Necrotic Amount: Small (1-33%) Necrotic Quality: Adherent Slough Treatment Notes Wound #1 (Calcaneus) Wound Laterality: Left Cleanser Byram Ancillary Kit - 15 Day Supply Discharge Instruction: Use supplies as instructed; Kit contains: (15) Saline Bullets; (15) 3x3 Gauze; 15 pr Gloves Soap and Water Discharge Instruction: Gently cleanse wound with antibacterial soap, rinse and pat dry prior to dressing wounds Peri-Wound Care Topical Sitter, Jourdan G. ([id number]) Primary Dressing Hydrofera Blue Ready Transfer Foam, 2.5x2.5 (in/in) Discharge  Instruction: Apply Hydrofera Blue Ready to wound bed as directed Secondary Dressing ABD Pad 5x9 (in/in) Discharge Instruction: Cover with ABD pad Secured With Medipore Tape -  29M Medipore H Soft Cloth Surgical Tape, 2x2 (in/yd) Kerlix Roll Sterile or Non-Sterile 6-ply 4.5x4 (yd/yd) Discharge Instruction: Apply Kerlix as directed Stretch Net Dressing, Latex-free, Size 5, Small-Head / Shoulder / Thigh Discharge Instruction: size 4 Compression Wrap Compression Stockings Add-Ons Electronic Signature(s) Signed: 09/12/2021 4:10:56 PM By: Levora Dredge Entered By: Levora Dredge on 09/12/2021 14:30:05 Runkles, [name] ([id number]) -------------------------------------------------------------------------------- Wound Assessment Details Patient Name: Boberg, [name]. Date of Service: 09/12/2021 2:15 PM Medical Record Number: [id number] Patient Account Number: [id number] Date of Birth/Sex: 06/07/1933 (88 y.o. F) Treating RN: Levora Dredge Primary Care Jaima Janney: [name] Referring Maysen Bonsignore: [name] Treating Avan Gullett/Extender: Skipper Cliche in Treatment: 6 Wound Status Wound Number: 2 Primary Etiology: Diabetic Wound/Ulcer of the Lower Extremity Wound Location: Left, Lateral Foot Wound Status: Open Wounding Event: Gradually Appeared Comorbid Hypertension, Type II Diabetes, Osteoarthritis, History: Neuropathy Date Acquired: 06/26/2021 Weeks Of Treatment: 6 Clustered Wound: No Photos Wound Measurements Length: (cm) 1.2 Width: (cm) 1 Depth: (cm) 0.2 Area: (cm) 0.942 Volume: (cm) 0.188 % Reduction in Area: 55.6% % Reduction in Volume: 11.3% Epithelialization: None Tunneling: No Undermining: No Wound Description Classification: Grade 2 Exudate Amount: Medium Exudate Type: Serosanguineous Exudate Color: red, brown Foul Odor After Cleansing: No Slough/Fibrino Yes Wound Bed Granulation Amount: Medium (34-66%) Exposed  Structure Granulation Quality: Red Fat Layer (Subcutaneous Tissue) Exposed: Yes Necrotic Amount: Medium (34-66%) Necrotic Quality: Adherent Slough Treatment Notes Wound #2 (Foot) Wound Laterality: Left, Lateral Cleanser Byram Ancillary Kit - 15 Day Supply Discharge Instruction: Use supplies as instructed; Kit contains: (15) Saline Bullets; (15) 3x3 Gauze; 15 pr Gloves Soap and Water Discharge Instruction: Gently cleanse wound with antibacterial soap, rinse and pat dry prior to dressing wounds Peri-Wound Care Topical Shipes, Angelisse GMarland Kitchen ([id number]) Primary Dressing Hydrofera Blue Ready Transfer Foam, 2.5x2.5 (in/in) Discharge Instruction: Apply Hydrofera Blue Ready to wound bed as directed Secondary Dressing ABD Pad 5x9 (in/in) Discharge Instruction: Cover with ABD pad Secured With Medipore Tape - 29M Medipore H Soft Cloth Surgical Tape, 2x2 (in/yd) Kerlix Roll Sterile or Non-Sterile 6-ply 4.5x4 (yd/yd) Discharge Instruction: Apply Kerlix as directed Stretch Net Dressing, Latex-free, Size 5, Small-Head / Shoulder / Thigh Discharge Instruction: size 4 Compression Wrap Compression Stockings Add-Ons Electronic Signature(s) Signed: 09/12/2021 4:10:56 PM By: Levora Dredge Entered By: Levora Dredge on 09/12/2021 14:30:39 Ashlock, [name] ([id number]) -------------------------------------------------------------------------------- Wound Assessment Details Patient Name: [name]. Date of Service: 09/12/2021 2:15 PM Medical Record Number: [id number] Patient Account Number: [id number] Date of Birth/Sex: [birth year] (88 y.o. F) Treating RN: Levora Dredge Primary Care Ashlynn Gunnels: [name] Referring Alette Kataoka: [name] Treating Dorothie Wah/Extender: Jeri Cos Weeks in Treatment: 6 Wound Status Wound Number: 3 Primary Etiology: Diabetic Wound/Ulcer of the Lower Extremity Wound Location: Left, Medial Lower Leg Wound Status:  Open Wounding Event: Gradually Appeared Comorbid Hypertension, Type II Diabetes, Osteoarthritis, History: Neuropathy Date Acquired: 06/26/2021 Weeks Of Treatment: 6 Clustered Wound: No Photos Wound Measurements Length: (cm) 1.7 Width: (cm) 1.9 Depth: (cm) 0.3 Area: (cm) 2.537 Volume: (cm) 0.761 % Reduction in Area: 35.4% % Reduction in Volume: 75.8% Epithelialization: None Tunneling: No Undermining: No Wound Description Classification: Grade 2 Exudate Amount: Medium Exudate Type: Serosanguineous Exudate Color: red, brown Foul Odor After Cleansing: No Slough/Fibrino Yes Wound Bed Granulation Amount: Medium (34-66%) Exposed Structure Granulation Quality: Red Fat Layer (Subcutaneous Tissue) Exposed: Yes Necrotic Amount: Medium (34-66%) Necrotic Quality: Adherent Slough Treatment Notes Wound #3 (Lower Leg) Wound Laterality: Left, Medial Cleanser Byram Ancillary Kit - 15  Day Supply Discharge Instruction: Use supplies as instructed; Kit contains: (15) Saline Bullets; (15) 3x3 Gauze; 15 pr Gloves Soap and Water Discharge Instruction: Gently cleanse wound with antibacterial soap, rinse and pat dry prior to dressing wounds Peri-Wound Care Topical Brook, Chad GMarland Kitchen ([id number]) Primary Dressing Hydrofera Blue Ready Transfer Foam, 2.5x2.5 (in/in) Discharge Instruction: Apply Hydrofera Blue Ready to wound bed as directed Secondary Dressing ABD Pad 5x9 (in/in) Discharge Instruction: Cover with ABD pad Secured With Medipore Tape - 56M Medipore H Soft Cloth Surgical Tape, 2x2 (in/yd) Kerlix Roll Sterile or Non-Sterile 6-ply 4.5x4 (yd/yd) Discharge Instruction: Apply Kerlix as directed Stretch Net Dressing, Latex-free, Size 5, Small-Head / Shoulder / Thigh Discharge Instruction: size 4 Compression Wrap Compression Stockings Add-Ons Electronic Signature(s) Signed: 09/12/2021 4:10:56 PM By: Levora Dredge Entered By: Levora Dredge on 09/12/2021 14:31:11 Davidson, [name]  ([id number]) -------------------------------------------------------------------------------- [place] Details Patient Name: [name] Date of Service: 09/12/2021 2:15 PM Medical Record Number: [id number] Patient Account Number: [id number] Date of Birth/Sex: [birth year] (88 y.o. F) Treating RN: Levora Dredge Primary Care Kauri Garson: [name] Referring Thelma Viana: [name] Treating Aylani Spurlock/Extender: Skipper Cliche in Treatment: 6 Vital Signs Time Taken: 14:20 Temperature (F): 97.7 Height (in): 64 Pulse (bpm): 70 Respiratory Rate (breaths/min): 18 Blood Pressure (mmHg): 188/70 Reference Range: 80 - 120 mg / dl Electronic Signature(s) Signed: 09/12/2021 4:10:56 PM By: Levora Dredge Entered By: Levora Dredge on 09/12/2021 14:20:22

## 2021-09-12 NOTE — Progress Notes (Addendum)
KANDISS, IHRIG ([id number]) Visit Report for 09/12/2021 Chief Complaint Document Details Patient Name: [name], [name]. Date of Service: 09/12/2021 2:15 PM Medical Record Number: [id number] Patient Account Number: [id number] Date of Birth/Sex: [birth year] (88 y.o. F) Treating RN: Cornell Barman Primary Care Provider: [name] Referring Provider: [name] Treating Provider/Extender: Skipper Cliche in Treatment: 6 Information Obtained from: Patient Chief Complaint Left LE Ulcers Electronic Signature(s) Signed: 09/12/2021 2:45:34 PM By: Worthy Keeler PA-C Entered By: Worthy Keeler on 09/12/2021 14:45:34 [place], [name] ([id number]) -------------------------------------------------------------------------------- Debridement Details Patient Name: [name] Date of Service: 09/12/2021 2:15 PM Medical Record Number: [id number] Patient Account Number: [id number] Date of Birth/Sex: 1933/10/02 (88 y.o. F) Treating RN: Levora Dredge Primary Care Provider: [name] Referring Provider: [name] Treating Provider/Extender: Skipper Cliche in Treatment: 6 Debridement Performed for Wound #1 Left Calcaneus Assessment: Performed By: Physician Tommie Sams., PA-C Debridement Type: Debridement Severity of Tissue Pre Debridement: Fat layer exposed Level of Consciousness (Pre- Awake and Alert procedure): Pre-procedure Verification/Time Out Yes - 14:47 Taken: Pain Control: Lidocaine 4% Topical Solution Total Area Debrided (L x W): 0.5 (cm) x 0.6 (cm) = 0.3 (cm) Tissue and other material Non-Viable, Callus, Slough, Subcutaneous, Slough debrided: Level: Skin/Subcutaneous Tissue Debridement Description: Excisional Instrument: Curette Bleeding: Minimum Hemostasis Achieved: Pressure Response to Treatment: Procedure was tolerated well Level of Consciousness (Post- Awake and  Alert procedure): Post Debridement Measurements of Total Wound Length: (cm) 0.5 Width: (cm) 0.6 Depth: (cm) 0.2 Volume: (cm) 0.047 Character of Wound/Ulcer Post Debridement: Stable Severity of Tissue Post Debridement: Fat layer exposed Post Procedure Diagnosis Same as Pre-procedure Electronic Signature(s) Signed: 09/12/2021 4:10:56 PM By: Levora Dredge Signed: 09/12/2021 6:41:29 PM By: Worthy Keeler PA-C Entered By: Levora Dredge on 09/12/2021 14:49:19 [place], [name] ([id number]) -------------------------------------------------------------------------------- Debridement Details Patient Name: [name] Date of Service: 09/12/2021 2:15 PM Medical Record Number: [id number] Patient Account Number: [id number] Date of Birth/Sex: Jun 13, 1933 (88 y.o. F) Treating RN: Levora Dredge Primary Care Provider: [name] Referring Provider: [name] Treating Provider/Extender: Skipper Cliche in Treatment: 6 Debridement Performed for Wound #2 Left,Lateral Foot Assessment: Performed By: Physician Tommie Sams., PA-C Debridement Type: Debridement Severity of Tissue Pre Debridement: Fat layer exposed Level of Consciousness (Pre- Awake and Alert procedure): Pre-procedure Verification/Time Out Yes - 14:49 Taken: Pain Control: Lidocaine 4% Topical Solution Total Area Debrided (L x W): 1.2 (cm) x 1 (cm) = 1.2 (cm) Tissue and other material Non-Viable, Callus, Slough, Subcutaneous, Slough debrided: Level: Skin/Subcutaneous Tissue Debridement Description: Excisional Instrument: Curette Bleeding: Minimum Hemostasis Achieved: Pressure Response to Treatment: Procedure was tolerated well Level of Consciousness (Post- Awake and Alert procedure): Post Debridement Measurements of Total Wound Length: (cm) 1.2 Width: (cm) 1 Depth: (cm) 0.2 Volume: (cm) 0.188 Character of Wound/Ulcer Post Debridement: Stable Severity of Tissue  Post Debridement: Fat layer exposed Post Procedure Diagnosis Same as Pre-procedure Electronic Signature(s) Signed: 09/12/2021 4:10:56 PM By: Levora Dredge Signed: 09/12/2021 6:41:29 PM By: Worthy Keeler PA-C Entered By: Levora Dredge on 09/12/2021 14:50:05 [place], [name] ([id number]) -------------------------------------------------------------------------------- Debridement Details Patient Name: [name] Date of Service: 09/12/2021 2:15 PM Medical Record Number: [id number] Patient Account Number: [id number] Date of Birth/Sex: 12-30-1933 (88 y.o. F) Treating RN: Levora Dredge Primary Care Provider: [name] Referring Provider: [name] Treating Provider/Extender: Jeri Cos Weeks in Treatment: 6 Debridement Performed for Wound #3 Left,Medial Lower Leg Assessment:  Performed By: Physician Tommie Sams., PA-C Debridement Type: Debridement Severity of Tissue Pre Debridement: Fat layer exposed Level of Consciousness (Pre- Awake and Alert procedure): Pre-procedure Verification/Time Out Yes - 14:50 Taken: Pain Control: Lidocaine 4% Topical Solution Total Area Debrided (L x W): 1.7 (cm) x 1.9 (cm) = 3.23 (cm) Tissue and other material Non-Viable, Slough, Subcutaneous, Slough debrided: Level: Skin/Subcutaneous Tissue Debridement Description: Excisional Instrument: Curette Bleeding: Minimum Hemostasis Achieved: Pressure Response to Treatment: Procedure was tolerated well Level of Consciousness (Post- Awake and Alert procedure): Post Debridement Measurements of Total Wound Length: (cm) 1.7 Width: (cm) 1.9 Depth: (cm) 0.3 Volume: (cm) 0.761 Character of Wound/Ulcer Post Debridement: Stable Severity of Tissue Post Debridement: Fat layer exposed Post Procedure Diagnosis Same as Pre-procedure Electronic Signature(s) Signed: 09/12/2021 4:10:56 PM By: Levora Dredge Signed: 09/12/2021 6:41:29 PM By: Worthy Keeler  PA-C Entered By: Levora Dredge on 09/12/2021 14:51:00 [place], [name] ([id number]) -------------------------------------------------------------------------------- HPI Details Patient Name: [name] Date of Service: 09/12/2021 2:15 PM Medical Record Number: [id number] Patient Account Number: [id number] Date of Birth/Sex: Jan 03, 1934 (88 y.o. F) Treating RN: Cornell Barman Primary Care Provider: [name] Referring Provider: [name] Treating Provider/Extender: Skipper Cliche in Treatment: 6 History of Present Illness HPI Description: 08-01-2021 upon evaluation today patient appears to be doing better in regard to her wounds over the foot. This on the left foot as well as the lower extremity medially was all due to a peripheral vascular issue and arterial insufficiency. Subsequently the patient did undergo intervention in order to clear this away as far as the blockage was concerned. Dr. Delana Meyer performed an arteriogram on 06-11-2021. She had a follow-up ABI/TBI performed on 07-11-2021. This showed that there was a post intervention ABI of zero 1.09 on the left with heel monophasic today toe pressure of 0.37 but nonetheless the patient seems to be doing much better and actually appears to have the ability to heal at this time overall extremely pleased with what I am seeing and there is some need for sharp debridement today we will work on that at this point. Currently there is going to need to be some fairly aggressive debridement it does appear to be at this point. Patient has a history of diabetes mellitus type 2, peripheral vascular disease, and dementia. She is seen with her son who actually lives behind her and the patient actually lives alone but he takes good care of her and appears. 08-15-2021 upon evaluation today patient actually appears to be doing awesome in regard to her wounds. She has been tolerating the dressing changes without  complication. Fortunately there does not appear to be any signs of active infection locally or systemically at this time. No fevers, chills, nausea, vomiting, or diarrhea. 08-29-2021 upon evaluation today patient appears to be doing well currently in regard to her wound. She has been tolerating the dressing changes without complication all 3 wounds are showing signs of significant improvement. Fortunately I do not see any evidence of active infection locally or systemically at this time. 09-12-2021 upon evaluation today patient appears to be doing well with regard to her wounds. There is some need for sharp debridement fortunately there does not appear to be any signs of active infection locally or systemically at this time. No fevers, chills, nausea, vomiting, or diarrhea. Electronic Signature(s) Signed: 09/12/2021 2:57:09 PM By: Worthy Keeler PA-C Entered By: Worthy Keeler on 09/12/2021 14:57:09 [place], [name] ([id number]) -------------------------------------------------------------------------------- Physical Exam Details Patient Name:  Hamor, Julane G. Date of Service: 09/12/2021 2:15 PM Medical Record Number: [id number] Patient Account Number: [id number] Date of Birth/Sex: 08-Dec-1933 (88 y.o. F) Treating RN: Cornell Barman Primary Care Provider: [name] Referring Provider: [name] Treating Provider/Extender: Jeri Cos Weeks in Treatment: 6 Constitutional Well-nourished and well-hydrated in no acute distress. Respiratory normal breathing without difficulty. Psychiatric this patient is able to make decisions and demonstrates good insight into disease process. Alert and Oriented x 3. pleasant and cooperative. Notes Upon inspection patient's wound bed showed evidence of good granulation and epithelization at this point. Fortunately there does not appear to be any signs of infection which is great news I did perform debridement of clearway  some of the necrotic debris she tolerated that today without complication and postdebridement wound bed appears to be doing much better at all locations. Electronic Signature(s) Signed: 09/12/2021 2:57:29 PM By: Worthy Keeler PA-C Entered By: Worthy Keeler on 09/12/2021 14:57:29 [place], [name] ([id number]) -------------------------------------------------------------------------------- Physician Orders Details Patient Name: [name] Date of Service: 09/12/2021 2:15 PM Medical Record Number: [id number] Patient Account Number: [id number] Date of Birth/Sex: Apr 13, 1933 (88 y.o. F) Treating RN: Levora Dredge Primary Care Provider: [name] Referring Provider: [name] Treating Provider/Extender: Skipper Cliche in Treatment: 6 Verbal / Phone Orders: No Diagnosis Coding ICD-10 Coding Code Description I73.89 Other specified peripheral vascular diseases E11.621 Type 2 diabetes mellitus with foot ulcer L97.522 Non-pressure chronic ulcer of other part of left foot with fat layer exposed L97.822 Non-pressure chronic ulcer of other part of left lower leg with fat layer exposed L97.422 Non-pressure chronic ulcer of left heel and midfoot with fat layer exposed Vascular dementia, unspecified severity, without behavioral disturbance, psychotic disturbance, mood disturbance, and F01.50 anxiety Follow-up Appointments o Return Appointment in 2 weeks. o Nurse Visit as needed [employer] o Wash wounds with antibacterial soap and water. - dial soap recommended o May shower; gently cleanse wound with antibacterial soap, rinse and pat dry prior to dressing wounds o No tub bath. Edema Control - Lymphedema / Segmental Compressive Device / Other o Elevate, Exercise Daily and Avoid Standing for Long Periods of Time. o Elevate leg(s) parallel to the floor when sitting. o DO YOUR BEST to sleep in the bed at night. DO  NOT sleep in your recliner. Long hours of sitting in a recliner leads to swelling of the legs and/or potential wounds on your backside. Off-Loading o Turn and reposition every 2 hours o Other: - keep heels off bed when sleeping, do best to keep pressure off heels Wound Treatment Wound #1 - Calcaneus Wound Laterality: Left Cleanser: Byram Ancillary Kit - 15 Day Supply (Generic) 3 x Per Week/30 Days Discharge Instructions: Use supplies as instructed; Kit contains: (15) Saline Bullets; (15) 3x3 Gauze; 15 pr Gloves Cleanser: Soap and Water 3 x Per Week/30 Days Discharge Instructions: Gently cleanse wound with antibacterial soap, rinse and pat dry prior to dressing wounds Primary Dressing: Hydrofera Blue Ready Transfer Foam, 2.5x2.5 (in/in) (Generic) 3 x Per Week/30 Days Discharge Instructions: Apply Hydrofera Blue Ready to wound bed as directed Secondary Dressing: ABD Pad 5x9 (in/in) (Generic) 3 x Per Week/30 Days Discharge Instructions: Cover with ABD pad Secured With: Medipore Tape - 74M Medipore H Soft Cloth Surgical Tape, 2x2 (in/yd) (Generic) 3 x Per Week/30 Days Secured With: [employer] Sterile or Non-Sterile 6-ply 4.5x4 (yd/yd) (Generic) 3 x Per Week/30 Days Discharge Instructions: Apply Kerlix as directed Secured With:  Stretch Net Dressing, Latex-free, Size 5, Small-Head / Shoulder / Thigh 3 x Per Week/30 Days Discharge Instructions: size 4 Wound #2 - Foot Wound Laterality: Left, Lateral Cleanser: Byram Ancillary Kit - 15 Day Supply (Generic) 3 x Per Week/30 Days [place], Jalecia G. ([id number]) Discharge Instructions: Use supplies as instructed; Kit contains: (15) Saline Bullets; (15) 3x3 Gauze; 15 pr Gloves Cleanser: Soap and Water 3 x Per Week/30 Days Discharge Instructions: Gently cleanse wound with antibacterial soap, rinse and pat dry prior to dressing wounds Primary Dressing: Hydrofera Blue Ready Transfer Foam, 2.5x2.5 (in/in) (Generic) 3 x Per Week/30 Days Discharge  Instructions: Apply Hydrofera Blue Ready to wound bed as directed Secondary Dressing: ABD Pad 5x9 (in/in) (Generic) 3 x Per Week/30 Days Discharge Instructions: Cover with ABD pad Secured With: Medipore Tape - 172M Medipore H Soft Cloth Surgical Tape, 2x2 (in/yd) (Generic) 3 x Per Week/30 Days Secured With: [employer] Sterile or Non-Sterile 6-ply 4.5x4 (yd/yd) (Generic) 3 x Per Week/30 Days Discharge Instructions: Apply Kerlix as directed Secured With: [employer] Dressing, Latex-free, Size 5, Small-Head / Shoulder / Thigh 3 x Per Week/30 Days Discharge Instructions: size 4 Wound #3 - Lower Leg Wound Laterality: Left, Medial Cleanser: Byram Ancillary Kit - 15 Day Supply (Generic) 3 x Per Week/30 Days Discharge Instructions: Use supplies as instructed; Kit contains: (15) Saline Bullets; (15) 3x3 Gauze; 15 pr Gloves Cleanser: Soap and Water 3 x Per Week/30 Days Discharge Instructions: Gently cleanse wound with antibacterial soap, rinse and pat dry prior to dressing wounds Primary Dressing: Hydrofera Blue Ready Transfer Foam, 2.5x2.5 (in/in) (Generic) 3 x Per Week/30 Days Discharge Instructions: Apply Hydrofera Blue Ready to wound bed as directed Secondary Dressing: ABD Pad 5x9 (in/in) (Generic) 3 x Per Week/30 Days Discharge Instructions: Cover with ABD pad Secured With: Medipore Tape - 172M Medipore H Soft Cloth Surgical Tape, 2x2 (in/yd) (Generic) 3 x Per Week/30 Days Secured With: Kerlix Roll Sterile or Non-Sterile 6-ply 4.5x4 (yd/yd) (Generic) 3 x Per Week/30 Days Discharge Instructions: Apply Kerlix as directed Secured With: Stretch Net Dressing, Latex-free, Size 5, Small-Head / Shoulder / Thigh 3 x Per Week/30 Days Discharge Instructions: size 4 Electronic Signature(s) Signed: 09/12/2021 4:10:56 PM By: Levora Dredge Signed: 09/12/2021 6:41:29 PM By: Worthy Keeler PA-C Entered By: Levora Dredge on 09/12/2021 14:58:58 [place], [name]  ([id number]) -------------------------------------------------------------------------------- Problem List Details Patient Name: [name]. Date of Service: 09/12/2021 2:15 PM Medical Record Number: [id number] Patient Account Number: [id number] Date of Birth/Sex: Dec 19, 1933 (88 y.o. F) Treating RN: Cornell Barman Primary Care Provider: [name] Referring Provider: [name] Treating Provider/Extender: Skipper Cliche in Treatment: 6 Active Problems ICD-10 Encounter Code Description Active Date MDM Diagnosis I73.89 Other specified peripheral vascular diseases 08/01/2021 No Yes E11.621 Type 2 diabetes mellitus with foot ulcer 08/01/2021 No Yes L97.522 Non-pressure chronic ulcer of other part of left foot with fat layer 08/01/2021 No Yes exposed L97.822 Non-pressure chronic ulcer of other part of left lower leg with fat layer 08/01/2021 No Yes exposed L97.422 Non-pressure chronic ulcer of left heel and midfoot with fat layer 08/01/2021 No Yes exposed F01.50 Vascular dementia, unspecified severity, without behavioral disturbance, 08/01/2021 No Yes psychotic disturbance, mood disturbance, and anxiety Inactive Problems Resolved Problems Electronic Signature(s) Signed: 09/12/2021 2:45:32 PM By: Worthy Keeler PA-C Entered By: Worthy Keeler on 09/12/2021 14:45:31 [place], [name] ([id number]) -------------------------------------------------------------------------------- Progress Note Details Patient Name: [name] Date of Service: 09/12/2021 2:15 PM Medical Record Number: [id number] Patient  Account Number: [id number] Date of Birth/Sex: Jul 26, 1933 (87 y.o. F) Treating RN: Cornell Barman Primary Care Provider: [name] Referring Provider: [name] Treating Provider/Extender: Skipper Cliche in Treatment: 6 Subjective Chief Complaint Information obtained from Patient Left LE  Ulcers History of Present Illness (HPI) 08-01-2021 upon evaluation today patient appears to be doing better in regard to her wounds over the foot. This on the left foot as well as the lower extremity medially was all due to a peripheral vascular issue and arterial insufficiency. Subsequently the patient did undergo intervention in order to clear this away as far as the blockage was concerned. Dr. Delana Meyer performed an arteriogram on 06-11-2021. She had a follow-up ABI/TBI performed on 07-11-2021. This showed that there was a post intervention ABI of zero 1.09 on the left with heel monophasic today toe pressure of 0.37 but nonetheless the patient seems to be doing much better and actually appears to have the ability to heal at this time overall extremely pleased with what I am seeing and there is some need for sharp debridement today we will work on that at this point. Currently there is going to need to be some fairly aggressive debridement it does appear to be at this point. Patient has a history of diabetes mellitus type 2, peripheral vascular disease, and dementia. She is seen with her son who actually lives behind her and the patient actually lives alone but he takes good care of her and appears. 08-15-2021 upon evaluation today patient actually appears to be doing awesome in regard to her wounds. She has been tolerating the dressing changes without complication. Fortunately there does not appear to be any signs of active infection locally or systemically at this time. No fevers, chills, nausea, vomiting, or diarrhea. 08-29-2021 upon evaluation today patient appears to be doing well currently in regard to her wound. She has been tolerating the dressing changes without complication all 3 wounds are showing signs of significant improvement. Fortunately I do not see any evidence of active infection locally or systemically at this time. 09-12-2021 upon evaluation today patient appears to be doing well with  regard to her wounds. There is some need for sharp debridement fortunately there does not appear to be any signs of active infection locally or systemically at this time. No fevers, chills, nausea, vomiting, or diarrhea. Objective Constitutional Well-nourished and well-hydrated in no acute distress. Vitals Time Taken: 2:20 PM, Height: 64 in, Temperature: 97.7 F, Pulse: 70 bpm, Respiratory Rate: 18 breaths/min, Blood Pressure: 188/70 mmHg. Respiratory normal breathing without difficulty. Psychiatric this patient is able to make decisions and demonstrates good insight into disease process. Alert and Oriented x 3. pleasant and cooperative. General Notes: Upon inspection patient's wound bed showed evidence of good granulation and epithelization at this point. Fortunately there does not appear to be any signs of infection which is great news I did perform debridement of clearway some of the necrotic debris she tolerated that today without complication and postdebridement wound bed appears to be doing much better at all locations. Integumentary (Hair, Skin) Wound #1 status is Open. Original cause of wound was Gradually Appeared. The date acquired was: 06/26/2021. The wound has been in treatment 6 weeks. The wound is located on the Left Calcaneus. The wound measures 0.5cm length x 0.6cm width x 0.2cm depth; 0.236cm^2 area and 0.047cm^3 volume. There is Fat Layer (Subcutaneous Tissue) exposed. There is no tunneling or undermining noted. There is a medium amount of serosanguineous drainage noted.  There is large (67-100%) red granulation within the wound bed. There is a small (1-33%) amount of necrotic tissue within the wound bed including Adherent Slough. [place], [place] ([id number]) Wound #2 status is Open. Original cause of wound was Gradually Appeared. The date acquired was: 06/26/2021. The wound has been in treatment 6 weeks. The wound is located on the Left,Lateral Foot. The wound measures 1.2cm  length x 1cm width x 0.2cm depth; 0.942cm^2 area and 0.188cm^3 volume. There is Fat Layer (Subcutaneous Tissue) exposed. There is no tunneling or undermining noted. There is a medium amount of serosanguineous drainage noted. There is medium (34-66%) red granulation within the wound bed. There is a medium (34-66%) amount of necrotic tissue within the wound bed including Adherent Slough. Wound #3 status is Open. Original cause of wound was Gradually Appeared. The date acquired was: 06/26/2021. The wound has been in treatment 6 weeks. The wound is located on the Left,Medial Lower Leg. The wound measures 1.7cm length x 1.9cm width x 0.3cm depth; 2.537cm^2 area and 0.761cm^3 volume. There is Fat Layer (Subcutaneous Tissue) exposed. There is no tunneling or undermining noted. There is a medium amount of serosanguineous drainage noted. There is medium (34-66%) red granulation within the wound bed. There is a medium (34-66%) amount of necrotic tissue within the wound bed including Adherent Slough. Assessment Active Problems ICD-10 Other specified peripheral vascular diseases Type 2 diabetes mellitus with foot ulcer Non-pressure chronic ulcer of other part of left foot with fat layer exposed Non-pressure chronic ulcer of other part of left lower leg with fat layer exposed Non-pressure chronic ulcer of left heel and midfoot with fat layer exposed Vascular dementia, unspecified severity, without behavioral disturbance, psychotic disturbance, mood disturbance, and anxiety Procedures Wound #1 Pre-procedure diagnosis of Wound #1 is a Diabetic Wound/Ulcer of the Lower Extremity located on the Left Calcaneus .Severity of Tissue Pre Debridement is: Fat layer exposed. There was a Excisional Skin/Subcutaneous Tissue Debridement with a total area of 0.3 sq cm performed by Tommie Sams., PA-C. With the following instrument(s): Curette to remove Non-Viable tissue/material. Material removed includes  Callus, Subcutaneous Tissue, and Slough after achieving pain control using Lidocaine 4% Topical Solution. No specimens were taken. A time out was conducted at 14:47, prior to the start of the procedure. A Minimum amount of bleeding was controlled with Pressure. The procedure was tolerated well. Post Debridement Measurements: 0.5cm length x 0.6cm width x 0.2cm depth; 0.047cm^3 volume. Character of Wound/Ulcer Post Debridement is stable. Severity of Tissue Post Debridement is: Fat layer exposed. Post procedure Diagnosis Wound #1: Same as Pre-Procedure Wound #2 Pre-procedure diagnosis of Wound #2 is a Diabetic Wound/Ulcer of the Lower Extremity located on the Left,Lateral Foot .Severity of Tissue Pre Debridement is: Fat layer exposed. There was a Excisional Skin/Subcutaneous Tissue Debridement with a total area of 1.2 sq cm performed by Tommie Sams., PA-C. With the following instrument(s): Curette to remove Non-Viable tissue/material. Material removed includes Callus, Subcutaneous Tissue, and Slough after achieving pain control using Lidocaine 4% Topical Solution. No specimens were taken. A time out was conducted at 14:49, prior to the start of the procedure. A Minimum amount of bleeding was controlled with Pressure. The procedure was tolerated well. Post Debridement Measurements: 1.2cm length x 1cm width x 0.2cm depth; 0.188cm^3 volume. Character of Wound/Ulcer Post Debridement is stable. Severity of Tissue Post Debridement is: Fat layer exposed. Post procedure Diagnosis Wound #2: Same as Pre-Procedure Wound #3 Pre-procedure diagnosis of Wound #3 is a Diabetic Wound/Ulcer of  the Lower Extremity located on the Left,Medial Lower Leg .Severity of Tissue Pre Debridement is: Fat layer exposed. There was a Excisional Skin/Subcutaneous Tissue Debridement with a total area of 3.23 sq cm performed by Tommie Sams., PA-C. With the following instrument(s): Curette to remove Non-Viable tissue/material.  Material removed includes Subcutaneous Tissue and Slough and after achieving pain control using Lidocaine 4% Topical Solution. No specimens were taken. A time out was conducted at 14:50, prior to the start of the procedure. A Minimum amount of bleeding was controlled with Pressure. The procedure was tolerated well. Post Debridement Measurements: 1.7cm length x 1.9cm width x 0.3cm depth; 0.761cm^3 volume. Character of Wound/Ulcer Post Debridement is stable. Severity of Tissue Post Debridement is: Fat layer exposed. Post procedure Diagnosis Wound #3: Same as Pre-Procedure Plan Sutliff, Sora G. ([id number]) 1. I Minna suggest that we go ahead and continue with the wound care measures as before and the patient is in agreement with that plan this includes the use of the [facility] which I think is doing an awesome job. 2. I am also can recommend that we have the patient continue to monitor for any signs of infection if anything changes she should contact the office and let me know. We will see patient back for reevaluation in 1 week here in the clinic. If anything worsens or changes patient will contact our office for additional recommendations. Electronic Signature(s) Signed: 09/12/2021 2:57:52 PM By: Worthy Keeler PA-C Entered By: Worthy Keeler on 09/12/2021 14:57:52 [place], [name] ([id number]) -------------------------------------------------------------------------------- SuperBill Details Patient Name: [name] Date of Service: 09/12/2021 Medical Record Number: [id number] Patient Account Number: [id number] Date of Birth/Sex: [birth year] (88 y.o. F) Treating RN: Cornell Barman Primary Care Provider: [name] Referring Provider: [name] Treating Provider/Extender: Skipper Cliche in Treatment: 6 Diagnosis Coding ICD-10 Codes Code Description I73.89 Other specified peripheral vascular diseases E11.621 Type 2 diabetes mellitus  with foot ulcer L97.522 Non-pressure chronic ulcer of other part of left foot with fat layer exposed L97.822 Non-pressure chronic ulcer of other part of left lower leg with fat layer exposed L97.422 Non-pressure chronic ulcer of left heel and midfoot with fat layer exposed Vascular dementia, unspecified severity, without behavioral disturbance, psychotic disturbance, mood disturbance, and F01.50 anxiety Facility Procedures CPT4 Code: 18299371 Description: 11042 - DEB SUBQ TISSUE 20 SQ CM/< Modifier: Quantity: 1 CPT4 Code: Description: ICD-10 Diagnosis Description L97.522 Non-pressure chronic ulcer of other part of left foot with fat layer expo L97.822 Non-pressure chronic ulcer of other part of left lower leg with fat layer L97.422 Non-pressure chronic ulcer of left heel  and midfoot with fat layer expose Modifier: sed exposed d Quantity: Physician Procedures CPT4 Code: 6967893 Description: 11042 - WC PHYS SUBQ TISS 20 SQ CM Modifier: Quantity: 1 CPT4 Code: Description: ICD-10 Diagnosis Description L97.522 Non-pressure chronic ulcer of other part of left foot with fat layer expo L97.822 Non-pressure chronic ulcer of other part of left lower leg with fat layer L97.422 Non-pressure chronic ulcer of left heel  and midfoot with fat layer expose Modifier: sed exposed d Quantity: Electronic Signature(s) Signed: 09/12/2021 4:10:56 PM By: Levora Dredge Signed: 09/12/2021 6:41:29 PM By: Worthy Keeler PA-C Previous Signature: 09/12/2021 2:58:49 PM Version By: Worthy Keeler PA-C Entered By: Levora Dredge on 09/12/2021 14:59:14

## 2021-09-26 ENCOUNTER — Encounter: Payer: Medicare HMO | Admitting: Physician Assistant

## 2021-09-26 DIAGNOSIS — E11621 Type 2 diabetes mellitus with foot ulcer: Secondary | ICD-10-CM | POA: Diagnosis not present

## 2021-09-26 DIAGNOSIS — L97422 Non-pressure chronic ulcer of left heel and midfoot with fat layer exposed: Secondary | ICD-10-CM | POA: Diagnosis not present

## 2021-09-26 DIAGNOSIS — E1151 Type 2 diabetes mellitus with diabetic peripheral angiopathy without gangrene: Secondary | ICD-10-CM | POA: Diagnosis not present

## 2021-09-26 DIAGNOSIS — F0154 Vascular dementia, unspecified severity, with anxiety: Secondary | ICD-10-CM | POA: Diagnosis not present

## 2021-09-26 DIAGNOSIS — E11622 Type 2 diabetes mellitus with other skin ulcer: Secondary | ICD-10-CM | POA: Diagnosis not present

## 2021-09-26 DIAGNOSIS — L97822 Non-pressure chronic ulcer of other part of left lower leg with fat layer exposed: Secondary | ICD-10-CM | POA: Diagnosis not present

## 2021-09-26 DIAGNOSIS — L97522 Non-pressure chronic ulcer of other part of left foot with fat layer exposed: Secondary | ICD-10-CM | POA: Diagnosis not present

## 2021-09-27 NOTE — Progress Notes (Signed)
JAQUALA, FULLER ([id number]) Visit Report for 09/26/2021 Chief Complaint Document Details Patient Name: [name], [name]. Date of Service: 09/26/2021 10:00 AM Medical Record Number: [id number] Patient Account Number: [id number] Date of Birth/Sex: [birth year] (87 y.o. F) Treating RN: Alycia Rossetti Primary Care Provider: [name] Other Clinician: Referring Provider: [name] Treating Provider/Extender: Skipper Cliche in Treatment: 8 Information Obtained from: Patient Chief Complaint Left LE Ulcers Electronic Signature(s) Signed: 09/26/2021 11:15:28 AM By: Worthy Keeler PA-C Entered By: Worthy Keeler on 09/26/2021 11:15:28 [employer], [name] ([id number]) -------------------------------------------------------------------------------- Debridement Details Patient Name: [name] Date of Service: 09/26/2021 10:00 AM Medical Record Number: [id number] Patient Account Number: [id number] Date of Birth/Sex: [birth year] (87 y.o. F) Treating RN: Cornell Barman Primary Care Provider: [name] Other Clinician: Referring Provider: [name] Treating Provider/Extender: Skipper Cliche in Treatment: 8 Debridement Performed for Wound #1 Left Calcaneus Assessment: Performed By: Physician Tommie Sams., PA-C Debridement Type: Debridement Severity of Tissue Pre Debridement: Fat layer exposed Level of Consciousness (Pre- Awake and Alert procedure): Pre-procedure Verification/Time Out Yes - 11:20 Taken: Start Time: 11:20 Pain Control: Lidocaine 4% Topical Solution Total Area Debrided (L x W): 0.2 (cm) x 0.2 (cm) = 0.04 (cm) Tissue and other material Non-Viable, Callus debrided: Level: Non-Viable Tissue Debridement Description: Selective/Open Wound Instrument: Curette Bleeding: None Hemostasis Achieved: Pressure Response to Treatment: Procedure was tolerated well Level of Consciousness (Post- Awake and Alert procedure): Post Debridement Measurements of  Total Wound Length: (cm) 0.2 Width: (cm) 0.2 Depth: (cm) 0.1 Volume: (cm) 0.003 Character of Wound/Ulcer Post Debridement: Improved Severity of Tissue Post Debridement: Fat layer exposed Post Procedure Diagnosis Same as Pre-procedure Electronic Signature(s) Signed: 09/26/2021 5:20:01 PM By: Gretta Cool, BSN, RN, CWS, [name] RN, BSN Signed: 09/26/2021 5:21:19 PM By: Worthy Keeler PA-C Entered By: Gretta Cool, BSN, RN, CWS, [name] on 09/26/2021 11:20:40 [place], [name] ([id number]) -------------------------------------------------------------------------------- Debridement Details Patient Name: [name]. Date of Service: 09/26/2021 10:00 AM Medical Record Number: [id number] Patient Account Number: [id number] Date of Birth/Sex: 1933-10-06 (87 y.o. F) Treating RN: Cornell Barman Primary Care Provider: [name] Other Clinician: Referring Provider: [name] Treating Provider/Extender: Skipper Cliche in Treatment: 8 Debridement Performed for Wound #2 Left,Lateral Foot Assessment: Performed By: Physician Tommie Sams., PA-C Debridement Type: Debridement Severity of Tissue Pre Debridement: Fat layer exposed Level of Consciousness (Pre- Awake and Alert procedure): Pre-procedure Verification/Time Out Yes - 11:20 Taken: Start Time: 11:20 Pain Control: Lidocaine 4% Topical Solution Total Area Debrided (L x W): 1.2 (cm) x 1 (cm) = 1.2 (cm) Tissue and other material Non-Viable, Callus, Slough, Slough debrided: Level: Non-Viable Tissue Debridement Description: Selective/Open Wound Instrument: Curette Bleeding: None Hemostasis Achieved: Pressure Response to Treatment: Procedure was tolerated well Level of Consciousness (Post- Awake and Alert procedure): Post Debridement Measurements of Total Wound Length: (cm) 1.2 Width: (cm) 1 Depth: (cm) 0.2 Volume: (cm) 0.188 Character of Wound/Ulcer Post Debridement: Improved Severity of Tissue Post Debridement: Fat layer  exposed Post Procedure Diagnosis Same as Pre-procedure Electronic Signature(s) Signed: 09/26/2021 5:20:01 PM By: Gretta Cool, BSN, RN, CWS, [name] RN, BSN Signed: 09/26/2021 5:21:19 PM By: Worthy Keeler PA-C Entered By: Gretta Cool, BSN, RN, CWS, [name] on 09/26/2021 11:22:05 [place], [name] ([id number]) -------------------------------------------------------------------------------- Debridement Details Patient Name: [name]. Date of Service: 09/26/2021 10:00 AM Medical Record Number: [id number] Patient Account Number: [id number] Date of Birth/Sex: May 13, 1933 (87 y.o. F) Treating RN: Cornell Barman Primary Care Provider: [name] Other Clinician: Referring Provider: [name] Treating Provider/Extender: Skipper Cliche  in Treatment: 8 Debridement Performed for Wound #3 Left,Medial Lower Leg Assessment: Performed By: Physician Tommie Sams., PA-C Debridement Type: Debridement Severity of Tissue Pre Debridement: Fat layer exposed Level of Consciousness (Pre- Awake and Alert procedure): Pre-procedure Verification/Time Out Yes - 11:20 Taken: Start Time: 11:20 Pain Control: Lidocaine 4% Topical Solution Total Area Debrided (L x W): 0.7 (cm) x 0.5 (cm) = 0.35 (cm) Tissue and other material Viable, Non-Viable, Slough, Subcutaneous, Biofilm, Slough debrided: Level: Skin/Subcutaneous Tissue Debridement Description: Excisional Instrument: Curette Bleeding: None Hemostasis Achieved: Pressure Response to Treatment: Procedure was tolerated well Level of Consciousness (Post- Awake and Alert procedure): Post Debridement Measurements of Total Wound Length: (cm) 0.7 Width: (cm) 0.5 Depth: (cm) 0.3 Volume: (cm) 0.082 Character of Wound/Ulcer Post Debridement: Improved Severity of Tissue Post Debridement: Fat layer exposed Post Procedure Diagnosis Same as Pre-procedure Electronic Signature(s) Signed: 09/26/2021 5:20:01 PM By: Gretta Cool, BSN, RN, CWS, [name] RN, BSN Signed: 09/26/2021  5:21:19 PM By: Worthy Keeler PA-C Entered By: Gretta Cool, BSN, RN, CWS, [name] on 09/26/2021 11:23:43 [place], [name] ([id number]) -------------------------------------------------------------------------------- HPI Details Patient Name: [name]. Date of Service: 09/26/2021 10:00 AM Medical Record Number: [id number] Patient Account Number: [id number] Date of Birth/Sex: Dec 10, 1933 (87 y.o. F) Treating RN: Alycia Rossetti Primary Care Provider: [name] Other Clinician: Referring Provider: [name] Treating Provider/Extender: Skipper Cliche in Treatment: 8 History of Present Illness HPI Description: 08-01-2021 upon evaluation today patient appears to be doing better in regard to her wounds over the foot. This on the left foot as well as the lower extremity medially was all due to a peripheral vascular issue and arterial insufficiency. Subsequently the patient did undergo intervention in order to clear this away as far as the blockage was concerned. Dr. Delana Meyer performed an arteriogram on 06-11-2021. She had a follow-up ABI/TBI performed on 07-11-2021. This showed that there was a post intervention ABI of zero 1.09 on the left with heel monophasic today toe pressure of 0.37 but nonetheless the patient seems to be doing much better and actually appears to have the ability to heal at this time overall extremely pleased with what I am seeing and there is some need for sharp debridement today we will work on that at this point. Currently there is going to need to be some fairly aggressive debridement it does appear to be at this point. Patient has a history of diabetes mellitus type 2, peripheral vascular disease, and dementia. She is seen with her son who actually lives behind her and the patient actually lives alone but he takes good care of her and appears. 08-15-2021 upon evaluation today patient actually appears to be doing awesome in regard to her wounds. She has been tolerating the  dressing changes without complication. Fortunately there does not appear to be any signs of active infection locally or systemically at this time. No fevers, chills, nausea, vomiting, or diarrhea. 08-29-2021 upon evaluation today patient appears to be doing well currently in regard to her wound. She has been tolerating the dressing changes without complication all 3 wounds are showing signs of significant improvement. Fortunately I do not see any evidence of active infection locally or systemically at this time. 09-12-2021 upon evaluation today patient appears to be doing well with regard to her wounds. There is some need for sharp debridement fortunately there does not appear to be any signs of active infection locally or systemically at this time. No fevers, chills, nausea, vomiting, or diarrhea. 09-26-2021 upon evaluation today patient appears  to be doing better in regard to her wounds across the board everything is measuring smaller and looks to be doing excellent. I am very pleased with where we stand today. Electronic Signature(s) Signed: 09/26/2021 5:18:32 PM By: Worthy Keeler PA-C Entered By: Worthy Keeler on 09/26/2021 17:18:32 [place], [name] ([id number]) -------------------------------------------------------------------------------- Physical Exam Details Patient Name: [place], [name]. Date of Service: 09/26/2021 10:00 AM Medical Record Number: [id number] Patient Account Number: [id number] Date of Birth/Sex: Jan 25, 1934 (87 y.o. F) Treating RN: Alycia Rossetti Primary Care Provider: [name] Other Clinician: Referring Provider: [name] Treating Provider/Extender: Skipper Cliche in Treatment: 8 Constitutional Well-nourished and well-hydrated in no acute distress. Respiratory normal breathing without difficulty. Psychiatric this patient is able to make decisions and demonstrates good insight into disease process. Alert and Oriented x 3. pleasant and  cooperative. Notes Upon inspection patient's wound bed did require sharp debridement at all locations she tolerated this debridement today without complication and postdebridement wound bed appears to be doing significantly better which is excellent news. Electronic Signature(s) Signed: 09/26/2021 5:18:52 PM By: Worthy Keeler PA-C Entered By: Worthy Keeler on 09/26/2021 17:18:51 [place], [name] ([id number]) -------------------------------------------------------------------------------- Physician Orders Details Patient Name: [name] Date of Service: 09/26/2021 10:00 AM Medical Record Number: [id number] Patient Account Number: [id number] Date of Birth/Sex: [birth year] (87 y.o. F) Treating RN: Cornell Barman Primary Care Provider: [name] Other Clinician: Referring Provider: [name] Treating Provider/Extender: Skipper Cliche in Treatment: 8 Verbal / Phone Orders: No Diagnosis Coding ICD-10 Coding Code Description I73.89 Other specified peripheral vascular diseases E11.621 Type 2 diabetes mellitus with foot ulcer L97.522 Non-pressure chronic ulcer of other part of left foot with fat layer exposed L97.822 Non-pressure chronic ulcer of other part of left lower leg with fat layer exposed L97.422 Non-pressure chronic ulcer of left heel and midfoot with fat layer exposed Vascular dementia, unspecified severity, without behavioral disturbance, psychotic disturbance, mood disturbance, and F01.50 anxiety Follow-up Appointments o Return Appointment in 2 weeks. o Nurse Visit as needed [employer] o Wash wounds with antibacterial soap and water. - dial soap recommended o May shower; gently cleanse wound with antibacterial soap, rinse and pat dry prior to dressing wounds o No tub bath. Anesthetic (Use 'Patient Medications' Section for Anesthetic Order Entry) o Lidocaine applied to wound bed Edema Control - Lymphedema / Segmental Compressive  Device / Other o Elevate, Exercise Daily and Avoid Standing for Long Periods of Time. o Elevate leg(s) parallel to the floor when sitting. o DO YOUR BEST to sleep in the bed at night. DO NOT sleep in your recliner. Long hours of sitting in a recliner leads to swelling of the legs and/or potential wounds on your backside. Off-Loading o Turn and reposition every 2 hours o Other: - keep heels off bed when sleeping, do best to keep pressure off heels Wound Treatment Wound #1 - Calcaneus Wound Laterality: Left Cleanser: Byram Ancillary Kit - 15 Day Supply (Generic) 3 x Per Week/30 Days Discharge Instructions: Use supplies as instructed; Kit contains: (15) Saline Bullets; (15) 3x3 Gauze; 15 pr Gloves Cleanser: Soap and Water 3 x Per Week/30 Days Discharge Instructions: Gently cleanse wound with antibacterial soap, rinse and pat dry prior to dressing wounds Primary Dressing: Hydrofera Blue Ready Transfer Foam, 2.5x2.5 (in/in) (Generic) 3 x Per Week/30 Days Discharge Instructions: Apply Hydrofera Blue Ready to wound bed as directed Secondary Dressing: ABD Pad 5x9 (in/in) (Generic) 3 x Per Week/30 Days Discharge Instructions: Cover with ABD pad  Secured With: [employer] H Soft Cloth Surgical Tape, 2x2 (in/yd) (Generic) 3 x Per Week/30 Days Secured With: [employer] Sterile or Non-Sterile 6-ply 4.5x4 (yd/yd) (Generic) 3 x Per Week/30 Days Discharge Instructions: Apply Kerlix as directed Secured With: Stretch Net Dressing, Latex-free, Size 5, Small-Head / Shoulder / Thigh 3 x Per Week/30 Days Discharge Instructions: size 4 [place], Mikiyah G. ([id number]) Wound #2 - Foot Wound Laterality: Left, Lateral Cleanser: Byram Ancillary Kit - 15 Day Supply (Generic) 3 x Per Week/30 Days Discharge Instructions: Use supplies as instructed; Kit contains: (15) Saline Bullets; (15) 3x3 Gauze; 15 pr Gloves Cleanser: Soap and Water 3 x Per Week/30 Days Discharge Instructions: Gently cleanse  wound with antibacterial soap, rinse and pat dry prior to dressing wounds Primary Dressing: Hydrofera Blue Ready Transfer Foam, 2.5x2.5 (in/in) (Generic) 3 x Per Week/30 Days Discharge Instructions: Apply Hydrofera Blue Ready to wound bed as directed Secondary Dressing: ABD Pad 5x9 (in/in) (Generic) 3 x Per Week/30 Days Discharge Instructions: Cover with ABD pad Secured With: Medipore Tape - 81M Medipore H Soft Cloth Surgical Tape, 2x2 (in/yd) (Generic) 3 x Per Week/30 Days Secured With: [employer] Sterile or Non-Sterile 6-ply 4.5x4 (yd/yd) (Generic) 3 x Per Week/30 Days Discharge Instructions: Apply Kerlix as directed Secured With: [employer] Dressing, Latex-free, Size 5, Small-Head / Shoulder / Thigh 3 x Per Week/30 Days Discharge Instructions: size 4 Wound #3 - Lower Leg Wound Laterality: Left, Medial Cleanser: Byram Ancillary Kit - 15 Day Supply (Generic) 3 x Per Week/30 Days Discharge Instructions: Use supplies as instructed; Kit contains: (15) Saline Bullets; (15) 3x3 Gauze; 15 pr Gloves Cleanser: Soap and Water 3 x Per Week/30 Days Discharge Instructions: Gently cleanse wound with antibacterial soap, rinse and pat dry prior to dressing wounds Primary Dressing: Hydrofera Blue Ready Transfer Foam, 2.5x2.5 (in/in) (Generic) 3 x Per Week/30 Days Discharge Instructions: Apply Hydrofera Blue Ready to wound bed as directed Secondary Dressing: ABD Pad 5x9 (in/in) (Generic) 3 x Per Week/30 Days Discharge Instructions: Cover with ABD pad Secured With: Medipore Tape - 81M Medipore H Soft Cloth Surgical Tape, 2x2 (in/yd) (Generic) 3 x Per Week/30 Days Secured With: Kerlix Roll Sterile or Non-Sterile 6-ply 4.5x4 (yd/yd) (Generic) 3 x Per Week/30 Days Discharge Instructions: Apply Kerlix as directed Secured With: Stretch Net Dressing, Latex-free, Size 5, Small-Head / Shoulder / Thigh 3 x Per Week/30 Days Discharge Instructions: size 4 Electronic Signature(s) Signed: 09/26/2021 2:37:10 PM By: Alycia Rossetti Signed: 09/26/2021 5:21:19 PM By: Worthy Keeler PA-C Entered By: Alycia Rossetti on 09/26/2021 12:31:29 [place], [name] ([id number]) -------------------------------------------------------------------------------- Problem List Details Patient Name: [name]. Date of Service: 09/26/2021 10:00 AM Medical Record Number: [id number] Patient Account Number: [id number] Date of Birth/Sex: [birth year] (87 y.o. F) Treating RN: Alycia Rossetti Primary Care Provider: [name] Other Clinician: Referring Provider: [name] Treating Provider/Extender: Skipper Cliche in Treatment: 8 Active Problems ICD-10 Encounter Code Description Active Date MDM Diagnosis I73.89 Other specified peripheral vascular diseases 08/01/2021 No Yes E11.621 Type 2 diabetes mellitus with foot ulcer 08/01/2021 No Yes L97.522 Non-pressure chronic ulcer of other part of left foot with fat layer 08/01/2021 No Yes exposed L97.822 Non-pressure chronic ulcer of other part of left lower leg with fat layer 08/01/2021 No Yes exposed L97.422 Non-pressure chronic ulcer of left heel and midfoot with fat layer 08/01/2021 No Yes exposed F01.50 Vascular dementia, unspecified severity, without behavioral disturbance, 08/01/2021 No Yes psychotic disturbance, mood disturbance, and anxiety Inactive Problems Resolved Problems Electronic  Signature(s) Signed: 09/26/2021 11:15:26 AM By: Worthy Keeler PA-C Entered By: Worthy Keeler on 09/26/2021 11:15:26 [place], [name] ([id number]) -------------------------------------------------------------------------------- Progress Note Details Patient Name: [name] Date of Service: 09/26/2021 10:00 AM Medical Record Number: [id number] Patient Account Number: [id number] Date of Birth/Sex: 1933-08-23 (87 y.o. F) Treating RN: Alycia Rossetti Primary Care Provider: [name] Other Clinician: Referring Provider: [name] Treating Provider/Extender: Skipper Cliche in Treatment: 8 Subjective Chief Complaint Information obtained from Patient Left LE Ulcers History of Present Illness (HPI) 08-01-2021 upon evaluation today patient appears to be doing better in regard to her wounds over the foot. This on the left foot as well as the lower extremity medially was all due to a peripheral vascular issue and arterial insufficiency. Subsequently the patient did undergo intervention in order to clear this away as far as the blockage was concerned. Dr. Delana Meyer performed an arteriogram on 06-11-2021. She had a follow-up ABI/TBI performed on 07-11-2021. This showed that there was a post intervention ABI of zero 1.09 on the left with heel monophasic today toe pressure of 0.37 but nonetheless the patient seems to be doing much better and actually appears to have the ability to heal at this time overall extremely pleased with what I am seeing and there is some need for sharp debridement today we will work on that at this point. Currently there is going to need to be some fairly aggressive debridement it does appear to be at this point. Patient has a history of diabetes mellitus type 2, peripheral vascular disease, and dementia. She is seen with her son who actually lives behind her and the patient actually lives alone but he takes good care of her and appears. 08-15-2021 upon evaluation today patient actually appears to be doing awesome in regard to her wounds. She has been tolerating the dressing changes without complication. Fortunately there does not appear to be any signs of active infection locally or systemically at this time. No fevers, chills, nausea, vomiting, or diarrhea. 08-29-2021 upon evaluation today patient appears to be doing well currently in regard to her wound. She has been tolerating the dressing changes without complication all 3 wounds are showing signs of significant improvement. Fortunately I do not see any evidence of active infection locally  or systemically at this time. 09-12-2021 upon evaluation today patient appears to be doing well with regard to her wounds. There is some need for sharp debridement fortunately there does not appear to be any signs of active infection locally or systemically at this time. No fevers, chills, nausea, vomiting, or diarrhea. 09-26-2021 upon evaluation today patient appears to be doing better in regard to her wounds across the board everything is measuring smaller and looks to be doing excellent. I am very pleased with where we stand today. Objective Constitutional Well-nourished and well-hydrated in no acute distress. Vitals Time Taken: 10:43 AM, Height: 64 in, Temperature: 98.2 F, Pulse: 82 bpm, Respiratory Rate: 18 breaths/min, Blood Pressure: 164/79 mmHg. Respiratory normal breathing without difficulty. Psychiatric this patient is able to make decisions and demonstrates good insight into disease process. Alert and Oriented x 3. pleasant and cooperative. General Notes: Upon inspection patient's wound bed did require sharp debridement at all locations she tolerated this debridement today without complication and postdebridement wound bed appears to be doing significantly better which is excellent news. Integumentary (Hair, Skin) Wound #1 status is Open. Original cause of wound was Gradually Appeared. The date acquired was: 06/26/2021. The wound has been in  treatment 8 weeks. The wound is located on the Left Calcaneus. The wound measures 0.2cm length x 0.2cm width x 0.1cm depth; 0.031cm^2 area and 0.003cm^3 volume. There is a medium amount of serosanguineous drainage noted. [place] ([id number]) Wound #2 status is Open. Original cause of wound was Gradually Appeared. The date acquired was: 06/26/2021. The wound has been in treatment 8 weeks. The wound is located on the Left,Lateral Foot. The wound measures 1.2cm length x 1cm width x 0.2cm depth; 0.942cm^2 area and 0.188cm^3 volume. There is  a medium amount of serosanguineous drainage noted. Wound #3 status is Open. Original cause of wound was Gradually Appeared. The date acquired was: 06/26/2021. The wound has been in treatment 8 weeks. The wound is located on the Left,Medial Lower Leg. The wound measures 0.7cm length x 0.5cm width x 0.3cm depth; 0.275cm^2 area and 0.082cm^3 volume. There is a medium amount of serosanguineous drainage noted. Assessment Active Problems ICD-10 Other specified peripheral vascular diseases Type 2 diabetes mellitus with foot ulcer Non-pressure chronic ulcer of other part of left foot with fat layer exposed Non-pressure chronic ulcer of other part of left lower leg with fat layer exposed Non-pressure chronic ulcer of left heel and midfoot with fat layer exposed Vascular dementia, unspecified severity, without behavioral disturbance, psychotic disturbance, mood disturbance, and anxiety Procedures Wound #1 Pre-procedure diagnosis of Wound #1 is a Diabetic Wound/Ulcer of the Lower Extremity located on the Left Calcaneus .Severity of Tissue Pre Debridement is: Fat layer exposed. There was a Selective/Open Wound Non-Viable Tissue Debridement with a total area of 0.04 sq cm performed by Tommie Sams., PA-C. With the following instrument(s): Curette to remove Non-Viable tissue/material. Material removed includes Callus after achieving pain control using Lidocaine 4% Topical Solution. A time out was conducted at 11:20, prior to the start of the procedure. There was no bleeding. The procedure was tolerated well. Post Debridement Measurements: 0.2cm length x 0.2cm width x 0.1cm depth; 0.003cm^3 volume. Character of Wound/Ulcer Post Debridement is improved. Severity of Tissue Post Debridement is: Fat layer exposed. Post procedure Diagnosis Wound #1: Same as Pre-Procedure Wound #2 Pre-procedure diagnosis of Wound #2 is a Diabetic Wound/Ulcer of the Lower Extremity located on the Left,Lateral Foot .Severity of  Tissue Pre Debridement is: Fat layer exposed. There was a Selective/Open Wound Non-Viable Tissue Debridement with a total area of 1.2 sq cm performed by Tommie Sams., PA-C. With the following instrument(s): Curette to remove Non-Viable tissue/material. Material removed includes Callus and Slough and after achieving pain control using Lidocaine 4% Topical Solution. A time out was conducted at 11:20, prior to the start of the procedure. There was no bleeding. The procedure was tolerated well. Post Debridement Measurements: 1.2cm length x 1cm width x 0.2cm depth; 0.188cm^3 volume. Character of Wound/Ulcer Post Debridement is improved. Severity of Tissue Post Debridement is: Fat layer exposed. Post procedure Diagnosis Wound #2: Same as Pre-Procedure Wound #3 Pre-procedure diagnosis of Wound #3 is a Diabetic Wound/Ulcer of the Lower Extremity located on the Left,Medial Lower Leg .Severity of Tissue Pre Debridement is: Fat layer exposed. There was a Excisional Skin/Subcutaneous Tissue Debridement with a total area of 0.35 sq cm performed by Tommie Sams., PA-C. With the following instrument(s): Curette to remove Viable and Non-Viable tissue/material. Material removed includes Subcutaneous Tissue, Slough, and Biofilm after achieving pain control using Lidocaine 4% Topical Solution. No specimens were taken. A time out was conducted at 11:20, prior to the start of the procedure. There was no bleeding. The procedure  was tolerated well. Post Debridement Measurements: 0.7cm length x 0.5cm width x 0.3cm depth; 0.082cm^3 volume. Character of Wound/Ulcer Post Debridement is improved. Severity of Tissue Post Debridement is: Fat layer exposed. Post procedure Diagnosis Wound #3: Same as Pre-Procedure Plan Follow-up Appointments: Return Appointment in 2 weeks. Nurse Visit as needed Bathing/ Shower/ Hygiene: Wash wounds with antibacterial soap and water. - dial soap recommended May shower; gently cleanse wound  with antibacterial soap, rinse and pat dry prior to dressing wounds No tub bath. CAIYA, BETTES ([id number]) Anesthetic (Use 'Patient Medications' Section for Anesthetic Order Entry): Lidocaine applied to wound bed Edema Control - Lymphedema / Segmental Compressive Device / Other: Elevate, Exercise Daily and Avoid Standing for Long Periods of Time. Elevate leg(s) parallel to the floor when sitting. DO YOUR BEST to sleep in the bed at night. DO NOT sleep in your recliner. Long hours of sitting in a recliner leads to swelling of the legs and/or potential wounds on your backside. Off-Loading: Turn and reposition every 2 hours Other: - keep heels off bed when sleeping, do best to keep pressure off heels WOUND #1: - Calcaneus Wound Laterality: Left Cleanser: [employer] Ancillary Kit - 15 Day Supply (Generic) 3 x Per Week/30 Days Discharge Instructions: Use supplies as instructed; Kit contains: (15) Saline Bullets; (15) 3x3 Gauze; 15 pr Gloves Cleanser: Soap and Water 3 x Per Week/30 Days Discharge Instructions: Gently cleanse wound with antibacterial soap, rinse and pat dry prior to dressing wounds Primary Dressing: Hydrofera Blue Ready Transfer Foam, 2.5x2.5 (in/in) (Generic) 3 x Per Week/30 Days Discharge Instructions: Apply Hydrofera Blue Ready to wound bed as directed Secondary Dressing: ABD Pad 5x9 (in/in) (Generic) 3 x Per Week/30 Days Discharge Instructions: Cover with ABD pad Secured With: Medipore Tape - 376M Medipore H Soft Cloth Surgical Tape, 2x2 (in/yd) (Generic) 3 x Per Week/30 Days Secured With: [employer] Sterile or Non-Sterile 6-ply 4.5x4 (yd/yd) (Generic) 3 x Per Week/30 Days Discharge Instructions: Apply Kerlix as directed Secured With: [employer] Dressing, Latex-free, Size 5, Small-Head / Shoulder / Thigh 3 x Per Week/30 Days Discharge Instructions: size 4 WOUND #2: - Foot Wound Laterality: Left, Lateral Cleanser: [employer] Ancillary Kit - 15 Day Supply (Generic) 3 x Per  Week/30 Days Discharge Instructions: Use supplies as instructed; Kit contains: (15) Saline Bullets; (15) 3x3 Gauze; 15 pr Gloves Cleanser: Soap and Water 3 x Per Week/30 Days Discharge Instructions: Gently cleanse wound with antibacterial soap, rinse and pat dry prior to dressing wounds Primary Dressing: Hydrofera Blue Ready Transfer Foam, 2.5x2.5 (in/in) (Generic) 3 x Per Week/30 Days Discharge Instructions: Apply Hydrofera Blue Ready to wound bed as directed Secondary Dressing: ABD Pad 5x9 (in/in) (Generic) 3 x Per Week/30 Days Discharge Instructions: Cover with ABD pad Secured With: Medipore Tape - 376M Medipore H Soft Cloth Surgical Tape, 2x2 (in/yd) (Generic) 3 x Per Week/30 Days Secured With: [employer] Sterile or Non-Sterile 6-ply 4.5x4 (yd/yd) (Generic) 3 x Per Week/30 Days Discharge Instructions: Apply Kerlix as directed Secured With: [employer] Dressing, Latex-free, Size 5, Small-Head / Shoulder / Thigh 3 x Per Week/30 Days Discharge Instructions: size 4 WOUND #3: - Lower Leg Wound Laterality: Left, Medial Cleanser: [employer] Ancillary Kit - 15 Day Supply (Generic) 3 x Per Week/30 Days Discharge Instructions: Use supplies as instructed; Kit contains: (15) Saline Bullets; (15) 3x3 Gauze; 15 pr Gloves Cleanser: Soap and Water 3 x Per Week/30 Days Discharge Instructions: Gently cleanse wound with antibacterial soap, rinse and pat dry prior to dressing wounds Primary  Dressing: Hydrofera Blue Ready Transfer Foam, 2.5x2.5 (in/in) (Generic) 3 x Per Week/30 Days Discharge Instructions: Apply Hydrofera Blue Ready to wound bed as directed Secondary Dressing: ABD Pad 5x9 (in/in) (Generic) 3 x Per Week/30 Days Discharge Instructions: Cover with ABD pad Secured With: Medipore Tape - 63M Medipore H Soft Cloth Surgical Tape, 2x2 (in/yd) (Generic) 3 x Per Week/30 Days Secured With: [employer] Sterile or Non-Sterile 6-ply 4.5x4 (yd/yd) (Generic) 3 x Per Week/30 Days Discharge Instructions: Apply Kerlix  as directed Secured With: [employer] Dressing, Latex-free, Size 5, Small-Head / Shoulder / Thigh 3 x Per Week/30 Days Discharge Instructions: size 4 1. I Minna recommend that we go ahead and continue with the wound care measures as before and the patient is in agreement with plan as is her son he is doing a great job taking care of her. We have been using the Hydrofera Blue at all locations and I am very pleased with this. 2. We will continue roll gauze and then stretch net to secure in place. We will see patient back for reevaluation in 2 weeks here in the clinic. If anything worsens or changes patient will contact our office for additional recommendations. Electronic Signature(s) Signed: 09/26/2021 5:19:18 PM By: Worthy Keeler PA-C Entered By: Worthy Keeler on 09/26/2021 17:19:18 Steinmeyer, [name] ([id number]) -------------------------------------------------------------------------------- SuperBill Details Patient Name: [name] Date of Service: 09/26/2021 Medical Record Number: [id number] Patient Account Number: [id number] Date of Birth/Sex: May 26, 1933 (88 y.o. F) Treating RN: Alycia Rossetti Primary Care Provider: [name] Other Clinician: Referring Provider: [name] Treating Provider/Extender: Skipper Cliche in Treatment: 8 Diagnosis Coding ICD-10 Codes Code Description I73.89 Other specified peripheral vascular diseases E11.621 Type 2 diabetes mellitus with foot ulcer L97.522 Non-pressure chronic ulcer of other part of left foot with fat layer exposed L97.822 Non-pressure chronic ulcer of other part of left lower leg with fat layer exposed L97.422 Non-pressure chronic ulcer of left heel and midfoot with fat layer exposed Vascular dementia, unspecified severity, without behavioral disturbance, psychotic disturbance, mood disturbance, and F01.50 anxiety Facility Procedures CPT4 Code: 48185631 Description: 11042 - DEB SUBQ TISSUE 20 SQ  CM/< Modifier: Quantity: 1 CPT4 Code: Description: ICD-10 Diagnosis Description L97.822 Non-pressure chronic ulcer of other part of left lower leg with fat layer Modifier: exposed Quantity: CPT4 Code: 49702637 Description: 85885 - DEBRIDE WOUND 1ST 20 SQ CM OR < Modifier: Quantity: 1 CPT4 Code: Description: ICD-10 Diagnosis Description L97.522 Non-pressure chronic ulcer of other part of left foot with fat layer expos L97.422 Non-pressure chronic ulcer of left heel and midfoot with fat layer exposed Modifier: ed Quantity: Physician Procedures CPT4 Code: 0277412 Description: 11042 - WC PHYS SUBQ TISS 20 SQ CM Modifier: Quantity: 1 CPT4 Code: Description: ICD-10 Diagnosis Description L97.822 Non-pressure chronic ulcer of other part of left lower leg with fat layer Modifier: exposed Quantity: CPT4 Code: [id number] Description: 20947 - WC PHYS DEBR WO ANESTH 20 SQ CM Modifier: Quantity: 1 CPT4 Code: Description: ICD-10 Diagnosis Description L97.522 Non-pressure chronic ulcer of other part of left foot with fat layer expos L97.422 Non-pressure chronic ulcer of left heel and midfoot with fat layer exposed Modifier: ed Quantity: Electronic Signature(s) Signed: 09/26/2021 5:20:01 PM By: Worthy Keeler PA-C Entered By: Worthy Keeler on 09/26/2021 17:20:01

## 2021-09-28 NOTE — Progress Notes (Signed)
VIRJEAN, BOMAN ([id number]) Visit Report for 09/26/2021 Arrival Information Details Patient Name: [name], [name]. Date of Service: 09/26/2021 10:00 AM Medical Record Number: [id number] Patient Account Number: [id number] Date of Birth/Sex: 1933-09-09 (87 y.o. F) Treating RN: Cornell Barman Primary Care Tasmia Blumer: [name] Other Clinician: Referring Melonee Gerstel: [name] Treating Izyan Ezzell/Extender: Skipper Cliche in Treatment: 8 Visit Information History Since Last Visit Added or deleted any medications: No Patient Arrived: Wheel Chair Any new allergies or adverse reactions: No Arrival Time: 10:40 Had a fall or experienced change in No Accompanied By: son activities of daily living that may affect Transfer Assistance: Other risk of falls: Patient Identification Verified: Yes Signs or symptoms of abuse/neglect since last visito No Secondary Verification Process Completed: Yes Hospitalized since last visit: No Patient Has Alerts: Yes Has Dressing in Place as Prescribed: Yes Patient Alerts: Patient on Blood Thinner Pain Present Now: No ABI 07/11/21 R 1.11 TBI 0.3 ABI 07/11/21 L 1.09 TBI 0.3 Electronic Signature(s) Signed: 09/26/2021 12:29:17 PM By: Alycia Rossetti Entered By: Alycia Rossetti on 09/26/2021 12:29:17 [name], [name] ([id number]) -------------------------------------------------------------------------------- Clinic Level of Care Assessment Details Patient Name: [name], [name]. Date of Service: 09/26/2021 10:00 AM Medical Record Number: [id number] Patient Account Number: [id number] Date of Birth/Sex: 24-Dec-1933 (87 y.o. F) Treating RN: Alycia Rossetti Primary Care [place] Baldwin: [name] Other Clinician: Referring [place] Gamero: [name] Treating Yesha Muchow/Extender: Skipper Cliche in Treatment: 8 Clinic Level of Care Assessment Items TOOL 1 Quantity Score _0  - Use when EandM and Procedure is performed on INITIAL visit 0 ASSESSMENTS - Nursing Assessment /  Reassessment _1  - General Physical Exam (combine w/ comprehensive assessment (listed just below) when performed on new 0 pt. evals) _2  - 0 Comprehensive Assessment (HX, ROS, Risk Assessments, Wounds Hx, etc.) ASSESSMENTS - Wound and Skin Assessment / Reassessment _3  - Dermatologic / Skin Assessment (not related to wound area) 0 ASSESSMENTS - Ostomy and/or Continence Assessment and Care _4  - Incontinence Assessment and Management 0 _5  - 0 Ostomy Care Assessment and Management (repouching, etc.) PROCESS - Coordination of Care _6  - Simple Patient / Family Education for ongoing care 0 _7  - 0 Complex (extensive) Patient / Family Education for ongoing care _8  - 0 Staff obtains Programmer, systems, Records, Test Results / Process Orders _9  - 0 Staff telephones HHA, Nursing Homes / Clarify orders / etc _10  - 0 Routine Transfer to another Facility (non-emergent condition) _11  - 0 Routine Hospital Admission (non-emergent condition) _12  - 0 New Admissions / Biomedical engineer / Ordering NPWT, Apligraf, etc. _13  - 0 Emergency Hospital Admission (emergent condition) PROCESS - Special Needs _14  - Pediatric / Minor Patient Management 0 _15  - 0 Isolation Patient Management _16  - 0 Hearing / Language / Visual special needs _17  - 0 Assessment of Community assistance (transportation, D/C planning, etc.) _18  - 0 Additional assistance / Altered mentation _19  - 0 Support Surface(s) Assessment (bed, cushion, seat, etc.) INTERVENTIONS - Miscellaneous _20  - External ear exam 0 _21  - 0 Patient Transfer (multiple staff / Civil Service fast streamer / Similar devices) _22  - 0 Simple Staple / Suture removal (25 or less) _23  - 0 Complex Staple / Suture removal (26 or more) _24  - 0 Hypo/Hyperglycemic Management (do not check if billed separately) _25  - 0 Ankle / Brachial Index (ABI) - do not check if billed separately Has the patient been seen at the hospital within the last three years: Yes Total Score: 0 Level Of Care:  ____ [name] ([id number]) Electronic Signature(s) Signed: 09/26/2021 2:37:10 PM By:  Alycia Rossetti Entered By: Alycia Rossetti on 09/26/2021 12:31:38 [name], [name] ([id number]) -------------------------------------------------------------------------------- Encounter Discharge Information Details Patient Name: [name], [name]. Date of Service: 09/26/2021 10:00 AM Medical Record Number: [id number] Patient Account Number: [id number] Date of Birth/Sex: 1934/01/29 (87 y.o. F) Treating RN: Cornell Barman Primary Care Leandro Berkowitz: [name] Other Clinician: Referring Ramzy Cappelletti: [name] Treating Maynard David/Extender: Skipper Cliche in Treatment: 8 Encounter Discharge Information Items Post Procedure Vitals Discharge Condition: Stable Temperature (F): 98.2 Ambulatory Status: Wheelchair Pulse (bpm): 82 Discharge Destination: Home Respiratory Rate (breaths/min): 18 Transportation: Private Auto Blood Pressure (mmHg): 164/79 Accompanied By: Son Schedule Follow-up Appointment: Yes Clinical Summary of Care: Electronic Signature(s) Signed: 09/26/2021 5:20:01 PM By: Gretta Cool, BSN, RN, CWS, [name] RN, BSN Entered By: Gretta Cool, BSN, RN, CWS, [name] on 09/26/2021 11:28:49 Browe, [name] ([id number]) -------------------------------------------------------------------------------- Lower Extremity Assessment Details Patient Name: [name], [name]. Date of Service: 09/26/2021 10:00 AM Medical Record Number: [id number] Patient Account Number: [id number] Date of Birth/Sex: 03-21-1933 (87 y.o. F) Treating RN: Cornell Barman Primary Care Darlyne Schmiesing: [name] Other Clinician: Referring Orton Capell: [name] Treating Errin Chewning/Extender: Jeri Cos Weeks in Treatment: 8 Edema Assessment Assessed: [Left: Yes] [Right: No] [Left: Edema] [Right: :] Calf Left: Right: Point of Measurement: 32 cm From Medial Instep 32.5 cm Ankle Left: Right: Point of Measurement: 9 cm From Medial Instep 19.2  cm Vascular Assessment Pulses: Dorsalis Pedis Palpable: [Left:Yes] Electronic Signature(s) Signed: 09/26/2021 12:30:37 PM By: Alycia Rossetti Signed: 09/26/2021 5:20:01 PM By: Gretta Cool, BSN, RN, CWS, [name] RN, BSN Previous Signature: 09/26/2021 12:30:10 PM Version By: Alycia Rossetti Entered By: Alycia Rossetti on 09/26/2021 12:30:36 Runde, [name] ([id number]) -------------------------------------------------------------------------------- Multi Wound Chart Details Patient Name: [name], [name]. Date of Service: 09/26/2021 10:00 AM Medical Record Number: [id number] Patient Account Number: [id number] Date of Birth/Sex: 26-May-1933 (87 y.o. F) Treating RN: Cornell Barman Primary Care Aadyn Buchheit: [name] Other Clinician: Referring Karalina Tift: [name] Treating Antonietta Lansdowne/Extender: Skipper Cliche in Treatment: 8 Vital Signs Height(in): 17 Pulse(bpm): 68 Weight(lbs): Blood Pressure(mmHg): 164/79 Body Mass Index(BMI): Temperature(F): 98.2 Respiratory Rate(breaths/min): 18 Photos: [1:No Photos] [2:No Photos] [3:No Photos] Wound Location: [1:Left Calcaneus] [2:Left, Lateral Foot] [3:Left, Medial Lower Leg] Wounding Event: [1:Gradually Appeared] [2:Gradually Appeared] [3:Gradually Appeared] Primary Etiology: [1:Diabetic Wound/Ulcer of the Lower Extremity] [2:Diabetic Wound/Ulcer of the Lower Extremity] [3:Diabetic Wound/Ulcer of the Lower Extremity] Date Acquired: [1:06/26/2021] [2:06/26/2021] [3:06/26/2021] Weeks of Treatment: [1:8] [2:8] [3:8] Wound Status: [1:Open] [2:Open] [3:Open] Wound Recurrence: [1:No] [2:No] [3:No] Measurements L x W x D (cm) [1:0.2x0.2x0.1] [2:1.2x1x0.2] [3:0.7x0.5x0.3] Area (cm) : [1:0.031] [2:0.942] [3:0.275] Volume (cm) : [1:0.003] [2:0.188] [3:0.082] % Reduction in Area: [1:99.90%] [2:55.60%] [3:93.00%] % Reduction in Volume: [1:100.00%] [2:11.30%] [3:97.40%] Classification: [1:Grade 2] [2:Grade 2] [3:Grade 2] Exudate Amount: [1:Medium] [2:Medium]  [3:Medium] Exudate Type: [1:Serosanguineous] [2:Serosanguineous] [3:Serosanguineous] Exudate Color: [1:red, brown] [2:red, brown] [3:red, brown] Debridement: [1:Debridement - Selective/Open Wound] [2:Debridement - Selective/Open Wound] [3:Debridement - Excisional] Pre-procedure Verification/Time 11:20 [2:11:20] [3:11:20] Out Taken: Pain Control: [1:Lidocaine 4% Topical Solution] [2:Lidocaine 4% Topical Solution] [3:Lidocaine 4% Topical Solution] Tissue Debrided: [1:Callus] [2:Callus, Slough] [3:Subcutaneous, Slough] Level: [1:Non-Viable Tissue] [2:Non-Viable Tissue] [3:Skin/Subcutaneous Tissue] Debridement Area (sq cm): [1:0.04] [2:1.2] [3:0.35] Instrument: [1:Curette] [2:Curette] [3:Curette] Bleeding: [1:None] [2:None] [3:None] Hemostasis Achieved: [1:Pressure] [2:Pressure] [3:Pressure] Debridement Treatment [1:Procedure was tolerated well] [2:Procedure was tolerated well] [3:Procedure was tolerated well] Response: Post Debridement [1:0.2x0.2x0.1] [2:1.2x1x0.2] [3:0.7x0.5x0.3] Measurements L x W x D (cm) Post Debridement Volume: [1:0.003] [2:0.188] [3:0.082] (cm) Procedures Performed: [1:Debridement] [2:Debridement] [3:Debridement] Treatment Notes Wound #1 (Calcaneus) Wound Laterality: Left Cleanser Byram Ancillary Kit - 15  Day Supply Discharge Instruction: Use supplies as instructed; Kit contains: (15) Saline Bullets; (15) 3x3 Gauze; 15 pr Gloves Soap and Water Discharge Instruction: Gently cleanse wound with antibacterial soap, rinse and pat dry prior to dressing wounds Peri-Wound Care Topical Noll, Jaydi GMarland Kitchen ([id number]) Primary Dressing Hydrofera Blue Ready Transfer Foam, 2.5x2.5 (in/in) Discharge Instruction: Apply Hydrofera Blue Ready to wound bed as directed Secondary Dressing ABD Pad 5x9 (in/in) Discharge Instruction: Cover with ABD pad Secured With Medipore Tape - 67M Medipore H Soft Cloth Surgical Tape, 2x2 (in/yd) Kerlix Roll Sterile or Non-Sterile 6-ply 4.5x4  (yd/yd) Discharge Instruction: Apply Kerlix as directed Stretch Net Dressing, Latex-free, Size 5, Small-Head / Shoulder / Thigh Discharge Instruction: size 4 Compression Wrap Compression Stockings Add-Ons Wound #2 (Foot) Wound Laterality: Left, Lateral Cleanser Byram Ancillary Kit - 15 Day Supply Discharge Instruction: Use supplies as instructed; Kit contains: (15) Saline Bullets; (15) 3x3 Gauze; 15 pr Gloves Soap and Water Discharge Instruction: Gently cleanse wound with antibacterial soap, rinse and pat dry prior to dressing wounds Peri-Wound Care Topical Primary Dressing Hydrofera Blue Ready Transfer Foam, 2.5x2.5 (in/in) Discharge Instruction: Apply Hydrofera Blue Ready to wound bed as directed Secondary Dressing ABD Pad 5x9 (in/in) Discharge Instruction: Cover with ABD pad Secured With Medipore Tape - 67M Medipore H Soft Cloth Surgical Tape, 2x2 (in/yd) Kerlix Roll Sterile or Non-Sterile 6-ply 4.5x4 (yd/yd) Discharge Instruction: Apply Kerlix as directed Stretch Net Dressing, Latex-free, Size 5, Small-Head / Shoulder / Thigh Discharge Instruction: size 4 Compression Wrap Compression Stockings Add-Ons Wound #3 (Lower Leg) Wound Laterality: Left, Medial Cleanser Byram Ancillary Kit - 15 Day Supply Discharge Instruction: Use supplies as instructed; Kit contains: (15) Saline Bullets; (15) 3x3 Gauze; 15 pr Gloves Soap and Water Discharge Instruction: Gently cleanse wound with antibacterial soap, rinse and pat dry prior to dressing wounds Peri-Wound Care Halperin, Labrisha Darnell Level ([id number]) Topical Primary Dressing Hydrofera Blue Ready Transfer Foam, 2.5x2.5 (in/in) Discharge Instruction: Apply Hydrofera Blue Ready to wound bed as directed Secondary Dressing ABD Pad 5x9 (in/in) Discharge Instruction: Cover with ABD pad Secured With [place] Surgical Tape, 2x2 (in/yd) Kerlix Roll Sterile or Non-Sterile 6-ply 4.5x4 (yd/yd) Discharge Instruction:  Apply Kerlix as directed Stretch Net Dressing, Latex-free, Size 5, Small-Head / Shoulder / Thigh Discharge Instruction: size 4 Compression Wrap Compression Stockings Add-Ons Electronic Signature(s) Signed: 09/26/2021 12:30:59 PM By: Alycia Rossetti Entered By: Alycia Rossetti on 09/26/2021 12:30:59 Gehret, [name] ([id number]) -------------------------------------------------------------------------------- [place] Details Patient Name: [name] Date of Service: 09/26/2021 10:00 AM Medical Record Number: [id number] Patient Account Number: [id number] Date of Birth/Sex: Jun 13, 1933 (87 y.o. F) Treating RN: Cornell Barman Primary Care Evelise Reine: [name] Other Clinician: Referring Claryssa Sandner: [name] Treating Aleem Elza/Extender: Skipper Cliche in Treatment: 8 Active Inactive Wound/Skin Impairment Nursing Diagnoses: Impaired tissue integrity Knowledge deficit related to ulceration/compromised skin integrity Goals: Ulcer/skin breakdown will have a volume reduction of 30% by week 4 Date Initiated: 08/01/2021 Target Resolution Date: 08/29/2021 Goal Status: Active Ulcer/skin breakdown will have a volume reduction of 50% by week 8 Date Initiated: 08/01/2021 Target Resolution Date: 09/26/2021 Goal Status: Active Ulcer/skin breakdown will have a volume reduction of 80% by week 12 Date Initiated: 08/01/2021 Target Resolution Date: 10/24/2021 Goal Status: Active Ulcer/skin breakdown will heal within 14 weeks Date Initiated: 08/01/2021 Target Resolution Date: 11/07/2021 Goal Status: Active Interventions: Assess patient/caregiver ability to obtain necessary supplies Assess patient/caregiver ability to perform ulcer/skin care regimen upon admission and as needed Assess ulceration(s) every visit Provide  education on ulcer and skin care Notes: Electronic Signature(s) Signed: 09/26/2021 12:30:45 PM By: Alycia Rossetti Signed: 09/26/2021 5:20:01 PM By: Gretta Cool, BSN, RN,  CWS, [name] RN, BSN Entered By: Alycia Rossetti on 09/26/2021 12:30:45 Steinberg, [name] ([id number]) -------------------------------------------------------------------------------- Pain Assessment Details Patient Name: [name]. Date of Service: 09/26/2021 10:00 AM Medical Record Number: [id number] Patient Account Number: [id number] Date of Birth/Sex: 1933-05-29 (87 y.o. F) Treating RN: Cornell Barman Primary Care Isys Tietje: [name] Other Clinician: Referring Mika Griffitts: [name] Treating Sinead Hockman/Extender: Skipper Cliche in Treatment: 8 Active Problems Location of Pain Severity and Description of Pain Patient Has Paino Yes Site Locations Pain Management and Medication Current Pain Management: Notes Patient states "justs hurt,hurt,hurt,hurt,hurt". Electronic Signature(s) Signed: 09/26/2021 2:37:10 PM By: Alycia Rossetti Signed: 09/26/2021 5:20:01 PM By: Gretta Cool, BSN, RN, CWS, [name] RN, BSN Entered By: Alycia Rossetti on 09/26/2021 12:29:31 [name], [name] ([id number]) -------------------------------------------------------------------------------- Patient/Caregiver Education Details Patient Name: [name] Date of Service: 09/26/2021 10:00 AM Medical Record Number: [id number] Patient Account Number: [id number] Date of Birth/Gender: Jul 07, 1933 (88 y.o. F) Treating RN: Cornell Barman Primary Care Physician: [name] Other Clinician: Referring Physician: [name] Treating Physician/Extender: Skipper Cliche in Treatment: 8 Education Assessment Education Provided To: Patient Education Topics Provided Wound/Skin Impairment: Handouts: Caring for Your Ulcer Methods: Demonstration, Explain/Verbal Responses: State content correctly Electronic Signature(s) Signed: 09/26/2021 5:20:01 PM By: Gretta Cool, BSN, RN, CWS, [name] RN, BSN Entered By: Gretta Cool, BSN, RN, CWS, [name] on 09/26/2021 17:14:05 Bomba, [name]  ([id number]) -------------------------------------------------------------------------------- Wound Assessment Details Patient Name: [name], [name]. Date of Service: 09/26/2021 10:00 AM Medical Record Number: [id number] Patient Account Number: [id number] Date of Birth/Sex: [birth year] (87 y.o. F) Treating RN: Cornell Barman Primary Care Chriss Mannan: [name] Other Clinician: Referring Tyan Dy: [name] Treating Sybol Morre/Extender: Skipper Cliche in Treatment: 8 Wound Status Wound Number: 1 Primary Etiology: Diabetic Wound/Ulcer of the Lower Extremity Wound Location: Left Calcaneus Wound Status: Open Wounding Event: Gradually Appeared Date Acquired: 06/26/2021 Weeks Of Treatment: 8 Clustered Wound: No Wound Measurements Length: (cm) 0.2 Width: (cm) 0.2 Depth: (cm) 0.1 Area: (cm) 0.031 Volume: (cm) 0.003 % Reduction in Area: 99.9% % Reduction in Volume: 100% Wound Description Classification: Grade 2 Exudate Amount: Medium Exudate Type: Serosanguineous Exudate Color: red, brown Treatment Notes Wound #1 (Calcaneus) Wound Laterality: Left Cleanser Byram Ancillary Kit - 15 Day Supply Discharge Instruction: Use supplies as instructed; Kit contains: (15) Saline Bullets; (15) 3x3 Gauze; 15 pr Gloves Soap and Water Discharge Instruction: Gently cleanse wound with antibacterial soap, rinse and pat dry prior to dressing wounds Peri-Wound Care Topical Primary Dressing Hydrofera Blue Ready Transfer Foam, 2.5x2.5 (in/in) Discharge Instruction: Apply Hydrofera Blue Ready to wound bed as directed Secondary Dressing ABD Pad 5x9 (in/in) Discharge Instruction: Cover with ABD pad Secured With Medipore Tape - 79M Medipore H Soft Cloth Surgical Tape, 2x2 (in/yd) Kerlix Roll Sterile or Non-Sterile 6-ply 4.5x4 (yd/yd) Discharge Instruction: Apply Kerlix as directed Stretch Net Dressing, Latex-free, Size 5, Small-Head / Shoulder / Thigh Discharge Instruction: size 4 Compression  Wrap Compression Stockings Add-Ons CARRINGTON, MULLENAX ([id number]) Electronic Signature(s) Signed: 09/26/2021 5:20:01 PM By: Gretta Cool, BSN, RN, CWS, [name] RN, BSN Entered By: Gretta Cool, BSN, RN, CWS, [name] on 09/26/2021 10:52:21 Hawn, [name] ([id number]) -------------------------------------------------------------------------------- Wound Assessment Details Patient Name: [name], [name]. Date of Service: 09/26/2021 10:00 AM Medical Record Number: [id number] Patient Account Number: [id number] Date of Birth/Sex: 03/19/1933 (87 y.o. F) Treating RN: Cornell Barman Primary Care Neithan Day: [name] Other Clinician: Referring Ahyan Kreeger: [name]  Treating Samanvitha Germany/Extender: Jeri Cos Weeks in Treatment: 8 Wound Status Wound Number: 2 Primary Etiology: Diabetic Wound/Ulcer of the Lower Extremity Wound Location: Left, Lateral Foot Wound Status: Open Wounding Event: Gradually Appeared Date Acquired: 06/26/2021 Weeks Of Treatment: 8 Clustered Wound: No Wound Measurements Length: (cm) 1.2 Width: (cm) 1 Depth: (cm) 0.2 Area: (cm) 0.942 Volume: (cm) 0.188 % Reduction in Area: 55.6% % Reduction in Volume: 11.3% Wound Description Classification: Grade 2 Exudate Amount: Medium Exudate Type: Serosanguineous Exudate Color: red, brown Treatment Notes Wound #2 (Foot) Wound Laterality: Left, Lateral Cleanser Byram Ancillary Kit - 15 Day Supply Discharge Instruction: Use supplies as instructed; Kit contains: (15) Saline Bullets; (15) 3x3 Gauze; 15 pr Gloves Soap and Water Discharge Instruction: Gently cleanse wound with antibacterial soap, rinse and pat dry prior to dressing wounds Peri-Wound Care Topical Primary Dressing Hydrofera Blue Ready Transfer Foam, 2.5x2.5 (in/in) Discharge Instruction: Apply Hydrofera Blue Ready to wound bed as directed Secondary Dressing ABD Pad 5x9 (in/in) Discharge Instruction: Cover with ABD pad Secured With Medipore Tape - 60M Medipore H Soft Cloth  Surgical Tape, 2x2 (in/yd) Kerlix Roll Sterile or Non-Sterile 6-ply 4.5x4 (yd/yd) Discharge Instruction: Apply Kerlix as directed Stretch Net Dressing, Latex-free, Size 5, Small-Head / Shoulder / Thigh Discharge Instruction: size 4 Compression Wrap Compression Stockings Add-Ons CRYSTIE, YANKO ([id number]) Electronic Signature(s) Signed: 09/26/2021 5:20:01 PM By: Gretta Cool, BSN, RN, CWS, [name] RN, BSN Entered By: Gretta Cool, BSN, RN, CWS, [name] on 09/26/2021 10:56:56 Majeed, [name] ([id number]) -------------------------------------------------------------------------------- Wound Assessment Details Patient Name: [name], [name]. Date of Service: 09/26/2021 10:00 AM Medical Record Number: [id number] Patient Account Number: [id number] Date of Birth/Sex: 05/17/1933 (87 y.o. F) Treating RN: Cornell Barman Primary Care Tomica Arseneault: [name] Other Clinician: Referring Obdulio Mash: [name] Treating Devynne Sturdivant/Extender: Jeri Cos Weeks in Treatment: 8 Wound Status Wound Number: 3 Primary Etiology: Diabetic Wound/Ulcer of the Lower Extremity Wound Location: Left, Medial Lower Leg Wound Status: Open Wounding Event: Gradually Appeared Date Acquired: 06/26/2021 Weeks Of Treatment: 8 Clustered Wound: No Wound Measurements Length: (cm) 0.7 Width: (cm) 0.5 Depth: (cm) 0.3 Area: (cm) 0.275 Volume: (cm) 0.082 % Reduction in Area: 93% % Reduction in Volume: 97.4% Wound Description Classification: Grade 2 Exudate Amount: Medium Exudate Type: Serosanguineous Exudate Color: red, brown Treatment Notes Wound #3 (Lower Leg) Wound Laterality: Left, Medial Cleanser Byram Ancillary Kit - 15 Day Supply Discharge Instruction: Use supplies as instructed; Kit contains: (15) Saline Bullets; (15) 3x3 Gauze; 15 pr Gloves Soap and Water Discharge Instruction: Gently cleanse wound with antibacterial soap, rinse and pat dry prior to dressing wounds Peri-Wound Care Topical Primary Dressing Hydrofera  Blue Ready Transfer Foam, 2.5x2.5 (in/in) Discharge Instruction: Apply Hydrofera Blue Ready to wound bed as directed Secondary Dressing ABD Pad 5x9 (in/in) Discharge Instruction: Cover with ABD pad Secured With Medipore Tape - 60M Medipore H Soft Cloth Surgical Tape, 2x2 (in/yd) Kerlix Roll Sterile or Non-Sterile 6-ply 4.5x4 (yd/yd) Discharge Instruction: Apply Kerlix as directed Stretch Net Dressing, Latex-free, Size 5, Small-Head / Shoulder / Thigh Discharge Instruction: size 4 Compression Wrap Compression Stockings Add-Ons SINDY, MCCUNE ([id number]) Electronic Signature(s) Signed: 09/26/2021 5:20:01 PM By: Gretta Cool, BSN, RN, CWS, [name] RN, BSN Entered By: Gretta Cool, BSN, RN, CWS, [name] on 09/26/2021 10:56:56 Guster, [name] ([id number]) -------------------------------------------------------------------------------- Vitals Details Patient Name: [name] Date of Service: 09/26/2021 10:00 AM Medical Record Number: [id number] Patient Account Number: [id number] Date of Birth/Sex: [birth year] (87 y.o. F) Treating RN: Cornell Barman Primary Care Gladiola Madore: [name] Other Clinician: Referring Ada Woodbury: [name] Treating  Yudit Modesitt/Extender: Jeri Cos Weeks in Treatment: 8 Vital Signs Time Taken: 10:43 Temperature (F): 98.2 Height (in): 64 Pulse (bpm): 82 Respiratory Rate (breaths/min): 18 Blood Pressure (mmHg): 164/79 Reference Range: 80 - 120 mg / dl Electronic Signature(s) Signed: 09/26/2021 12:29:23 PM By: Alycia Rossetti Entered By: Alycia Rossetti on 09/26/2021 12:29:23

## 2021-10-10 ENCOUNTER — Encounter: Payer: Medicare HMO | Admitting: Physician Assistant

## 2021-10-10 DIAGNOSIS — L97522 Non-pressure chronic ulcer of other part of left foot with fat layer exposed: Secondary | ICD-10-CM | POA: Diagnosis not present

## 2021-10-10 DIAGNOSIS — L97422 Non-pressure chronic ulcer of left heel and midfoot with fat layer exposed: Secondary | ICD-10-CM | POA: Diagnosis not present

## 2021-10-10 DIAGNOSIS — E11622 Type 2 diabetes mellitus with other skin ulcer: Secondary | ICD-10-CM | POA: Diagnosis not present

## 2021-10-10 DIAGNOSIS — L97822 Non-pressure chronic ulcer of other part of left lower leg with fat layer exposed: Secondary | ICD-10-CM | POA: Diagnosis not present

## 2021-10-10 DIAGNOSIS — E1151 Type 2 diabetes mellitus with diabetic peripheral angiopathy without gangrene: Secondary | ICD-10-CM | POA: Diagnosis not present

## 2021-10-10 DIAGNOSIS — F0154 Vascular dementia, unspecified severity, with anxiety: Secondary | ICD-10-CM | POA: Diagnosis not present

## 2021-10-10 DIAGNOSIS — E11621 Type 2 diabetes mellitus with foot ulcer: Secondary | ICD-10-CM | POA: Diagnosis not present

## 2021-10-10 NOTE — Progress Notes (Addendum)
TRINITA, DEVLIN ([id number]) Visit Report for 10/10/2021 Arrival Information Details Patient Name: [name], [name]. Date of Service: 10/10/2021 10:00 AM Medical Record Number: [id number] Patient Account Number: [id number] Date of Birth/Sex: 08/16/1933 (87 y.o. Female) Treating RN: Rhae Hammock Primary Care Frenchie Pribyl: [name] Other Clinician: Referring Nalanie Winiecki: [name] Treating Kimmy Parish/Extender: Skipper Cliche in Treatment: 10 Visit Information History Since Last Visit Added or deleted any medications: No Patient Arrived: Wheel Chair Any new allergies or adverse reactions: No Arrival Time: 10:32 Had a fall or experienced change in No Accompanied By: self activities of daily living that may affect Transfer Assistance: Manual risk of falls: Patient Identification Verified: Yes Signs or symptoms of abuse/neglect since last visito No Secondary Verification Process Completed: Yes Hospitalized since last visit: No Patient Has Alerts: Yes Implantable device outside of the clinic excluding No Patient Alerts: Patient on Blood Thinner cellular tissue based products placed in the center ABI 07/11/21 R 1.11 TBI 0.3 since last visit: ABI 07/11/21 L 1.09 TBI 0.3 Has Dressing in Place as Prescribed: Yes Pain Present Now: No Electronic Signature(s) Signed: 10/10/2021 4:20:12 PM By: Rhae Hammock RN Entered By: Rhae Hammock on 10/10/2021 10:36:18 Cathell, [name] ([id number]) -------------------------------------------------------------------------------- Encounter Discharge Information Details Patient Name: [name] Date of Service: 10/10/2021 10:00 AM Medical Record Number: [id number] Patient Account Number: [id number] Date of Birth/Sex: May 10, 1933 (87 y.o. Female) Treating RN: Rhae Hammock Primary Care Dequon Schnebly: [name] Other Clinician: Referring Mattison Stuckey: [name] Treating Mailin Coglianese/Extender: Skipper Cliche in Treatment:  10 Encounter Discharge Information Items Discharge Condition: Stable Ambulatory Status: Wheelchair Discharge Destination: Home Transportation: Private Auto Accompanied By: son Schedule Follow-up Appointment: Yes Clinical Summary of Care: Patient Declined Electronic Signature(s) Signed: 10/10/2021 4:20:12 PM By: Rhae Hammock RN Entered By: Rhae Hammock on 10/10/2021 10:56:09 Ipock, [name] ([id number]) -------------------------------------------------------------------------------- Lower Extremity Assessment Details Patient Name: [name]. Date of Service: 10/10/2021 10:00 AM Medical Record Number: [id number] Patient Account Number: [id number] Date of Birth/Sex: 1934/01/27 (87 y.o. Female) Treating RN: Rhae Hammock Primary Care Trilby Way: [name] Other Clinician: Referring Alexzia Kasler: [name] Treating Mariena Meares/Extender: Jeri Cos Weeks in Treatment: 10 Edema Assessment Assessed: [Left: Yes] [Right: No] Edema: [Left: Ye] [Right: s] Calf Left: Right: Point of Measurement: From Medial Instep 33 cm Ankle Left: Right: Point of Measurement: From Medial Instep 22 cm Vascular Assessment Pulses: Dorsalis Pedis Palpable: [Left:Yes] Posterior Tibial Palpable: [Left:Yes] Electronic Signature(s) Signed: 10/10/2021 4:20:12 PM By: Rhae Hammock RN Entered By: Rhae Hammock on 10/10/2021 10:48:16 Jaquay, [name] ([id number]) -------------------------------------------------------------------------------- [place] Details Patient Name: [name] Date of Service: 10/10/2021 10:00 AM Medical Record Number: [id number] Patient Account Number: [id number] Date of Birth/Sex: [birth year] (87 y.o. Female) Treating RN: Rhae Hammock Primary Care Chanika Byland: [name] Other Clinician: Referring Kadeidra Coryell: [name] Treating Lanisa Ishler/Extender: Skipper Cliche in Treatment: 10 Active Inactive Wound/Skin  Impairment Nursing Diagnoses: Impaired tissue integrity Knowledge deficit related to ulceration/compromised skin integrity Goals: Ulcer/skin breakdown will have a volume reduction of 30% by week 4 Date Initiated: 08/01/2021 Target Resolution Date: 10/10/2021 Goal Status: Active Ulcer/skin breakdown will have a volume reduction of 50% by week 8 Date Initiated: 08/01/2021 Target Resolution Date: 10/12/2021 Goal Status: Active Ulcer/skin breakdown will have a volume reduction of 80% by week 12 Date Initiated: 08/01/2021 Target Resolution Date: 10/24/2021 Goal Status: Active Ulcer/skin breakdown will heal within 14 weeks Date Initiated: 08/01/2021 Target Resolution Date: 11/07/2021 Goal Status: Active Interventions: Assess patient/caregiver ability to obtain necessary supplies Assess patient/caregiver ability to perform ulcer/skin  care regimen upon admission and as needed Assess ulceration(s) every visit Provide education on ulcer and skin care Notes: Electronic Signature(s) Signed: 10/10/2021 4:20:12 PM By: Rhae Hammock RN Entered By: Rhae Hammock on 10/10/2021 11:49:36 [place], [name] ([id number]) -------------------------------------------------------------------------------- Pain Assessment Details Patient Name: [name]. Date of Service: 10/10/2021 10:00 AM Medical Record Number: [id number] Patient Account Number: [id number] Date of Birth/Sex: [birth year] (87 y.o. Female) Treating RN: Rhae Hammock Primary Care Graciella Arment: [name] Other Clinician: Referring Ginia Rudell: [name] Treating Daleyssa Loiselle/Extender: Skipper Cliche in Treatment: 10 Active Problems Location of Pain Severity and Description of Pain Patient Has Paino No Site Locations Pain Management and Medication Current Pain Management: Electronic Signature(s) Signed: 10/10/2021 4:20:12 PM By: Rhae Hammock RN Entered By: Rhae Hammock on 10/10/2021 10:36:53 Floren, [name]  ([id number]) -------------------------------------------------------------------------------- Patient/Caregiver Education Details Patient Name: [name] Date of Service: 10/10/2021 10:00 AM Medical Record Number: [id number] Patient Account Number: [id number] Date of Birth/Gender: 02-21-1933 (88 y.o. Female) Treating RN: Rhae Hammock Primary Care Physician: [name] Other Clinician: Referring Physician: [name] Treating Physician/Extender: Skipper Cliche in Treatment: 10 Education Assessment Education Provided To: Patient and Caregiver Education Topics Provided Wound/Skin Impairment: Methods: Explain/Verbal Responses: Reinforcements needed, State content correctly Electronic Signature(s) Signed: 10/10/2021 4:20:12 PM By: Rhae Hammock RN Entered By: Rhae Hammock on 10/10/2021 10:55:38 Eichel, [name] ([id number]) -------------------------------------------------------------------------------- Wound Assessment Details Patient Name: [name] Date of Service: 10/10/2021 10:00 AM Medical Record Number: [id number] Patient Account Number: [id number] Date of Birth/Sex: September 04, 1933 (87 y.o. Female) Treating RN: Rhae Hammock Primary Care Mehtab Dolberry: [name] Other Clinician: Referring Pierra Skora: [name] Treating Lasandra Batley/Extender: Skipper Cliche in Treatment: 10 Wound Status Wound Number: 1 Primary Diabetic Wound/Ulcer of the Lower Extremity Etiology: Wound Location: Left Calcaneus Wound Status: Healed - Epithelialized Wounding Event: Gradually Appeared Comorbid Hypertension, Type II Diabetes, Osteoarthritis, Date Acquired: 06/26/2021 History: Neuropathy Weeks Of Treatment: 10 Clustered Wound: No Photos Wound Measurements Length: (cm) Width: (cm) Depth: (cm) Area: (cm) Volume: (cm) 0 % Reduction in Area: 100% 0 % Reduction in Volume: 100% 0 Epithelialization: Medium (34-66%) 0 Tunneling: No 0 Undermining:  No Wound Description Classification: Grade 2 Wound Margin: Distinct, outline attached Exudate Amount: Medium Exudate Type: Serosanguineous Exudate Color: red, brown Foul Odor After Cleansing: No Slough/Fibrino No Wound Bed Granulation Amount: Large (67-100%) Exposed Structure Granulation Quality: Red, Pink Fascia Exposed: No Fat Layer (Subcutaneous Tissue) Exposed: No Tendon Exposed: No Muscle Exposed: No Joint Exposed: No Bone Exposed: No Treatment Notes Wound #1 (Calcaneus) Wound Laterality: Left Cleanser Byram Ancillary Kit - 15 Day Supply Discharge Instruction: Use supplies as instructed; Kit contains: (15) Saline Bullets; (15) 3x3 Gauze; 15 pr Gloves Soap and Water [name], [name]. ([id number]) Discharge Instruction: Gently cleanse wound with antibacterial soap, rinse and pat dry prior to dressing wounds Peri-Wound Care Topical Primary Dressing Hydrofera Blue Ready Transfer Foam, 2.5x2.5 (in/in) Discharge Instruction: Apply Hydrofera Blue Ready to wound bed as directed Secondary Dressing ABD Pad 5x9 (in/in) Discharge Instruction: Cover with ABD pad Secured With Medipore Tape - 28M Medipore H Soft Cloth Surgical Tape, 2x2 (in/yd) Kerlix Roll Sterile or Non-Sterile 6-ply 4.5x4 (yd/yd) Discharge Instruction: Apply Kerlix as directed Stretch Net Dressing, Latex-free, Size 5, Small-Head / Shoulder / Thigh Discharge Instruction: size 4 Compression Wrap Compression Stockings Add-Ons Electronic Signature(s) Signed: 10/10/2021 4:20:12 PM By: Rhae Hammock RN Entered By: Rhae Hammock on 10/10/2021 13:28:58 Narducci, [name] ([id number]) -------------------------------------------------------------------------------- Wound Assessment Details Patient Name: [name], [name]. Date of Service: 10/10/2021 10:00  AM Medical Record Number: [id number] Patient Account Number: [id number] Date of Birth/Sex: [birth year] (87 y.o. Female) Treating RN: Rhae Hammock Primary  Care Abhijay Morriss: [name] Other Clinician: Referring Amadeo Coke: [name] Treating Leno Mathes/Extender: Skipper Cliche in Treatment: 10 Wound Status Wound Number: 2 Primary Diabetic Wound/Ulcer of the Lower Extremity Etiology: Wound Location: Left, Lateral Foot Wound Status: Open Wounding Event: Gradually Appeared Comorbid Hypertension, Type II Diabetes, Osteoarthritis, Date Acquired: 06/26/2021 History: Neuropathy Weeks Of Treatment: 10 Clustered Wound: No Photos Wound Measurements Length: (cm) 0.7 Width: (cm) 1 Depth: (cm) 0.3 Area: (cm) 0.55 Volume: (cm) 0.165 % Reduction in Area: 74.1% % Reduction in Volume: 22.2% Tunneling: No Undermining: No Wound Description Classification: Grade 2 Exudate Amount: Medium Exudate Type: Serosanguineous Exudate Color: red, brown Foul Odor After Cleansing: No Slough/Fibrino Yes Wound Bed Granulation Amount: Large (67-100%) Exposed Structure Granulation Quality: Red, Pink Fascia Exposed: No Necrotic Amount: Small (1-33%) Fat Layer (Subcutaneous Tissue) Exposed: Yes Necrotic Quality: Adherent Slough Tendon Exposed: No Muscle Exposed: No Joint Exposed: No Bone Exposed: No Treatment Notes Wound #2 (Foot) Wound Laterality: Left, Lateral Cleanser Byram Ancillary Kit - 15 Day Supply Discharge Instruction: Use supplies as instructed; Kit contains: (15) Saline Bullets; (15) 3x3 Gauze; 15 pr Gloves Soap and Water Discharge Instruction: Gently cleanse wound with antibacterial soap, rinse and pat dry prior to dressing wounds [name], [name]. ([id number]) Peri-Wound Care Topical Primary Dressing Hydrofera Blue Ready Transfer Foam, 2.5x2.5 (in/in) Discharge Instruction: Apply Hydrofera Blue Ready to wound bed as directed Secondary Dressing ABD Pad 5x9 (in/in) Discharge Instruction: Cover with ABD pad Secured With Medipore Tape - 78M Medipore H Soft Cloth Surgical Tape, 2x2 (in/yd) Kerlix Roll Sterile or Non-Sterile 6-ply  4.5x4 (yd/yd) Discharge Instruction: Apply Kerlix as directed Stretch Net Dressing, Latex-free, Size 5, Small-Head / Shoulder / Thigh Discharge Instruction: size 4 Compression Wrap Compression Stockings Add-Ons Electronic Signature(s) Signed: 10/10/2021 4:20:12 PM By: Rhae Hammock RN Entered By: Rhae Hammock on 10/10/2021 13:30:08 Yuille, [name] ([id number]) -------------------------------------------------------------------------------- Wound Assessment Details Patient Name: Odriscoll, [name]. Date of Service: 10/10/2021 10:00 AM Medical Record Number: [id number] Patient Account Number: [id number] Date of Birth/Sex: 09-24-1933 (87 y.o. Female) Treating RN: Rhae Hammock Primary Care Airrion Otting: [name] Other Clinician: Referring Garnet Overfield: [name] Treating Murle Hellstrom/Extender: Jeri Cos Weeks in Treatment: 10 Wound Status Wound Number: 3 Primary Diabetic Wound/Ulcer of the Lower Extremity Etiology: Wound Location: Left, Medial Lower Leg Wound Status: Open Wounding Event: Gradually Appeared Comorbid Hypertension, Type II Diabetes, Osteoarthritis, Date Acquired: 06/26/2021 History: Neuropathy Weeks Of Treatment: 10 Clustered Wound: No Photos Wound Measurements Length: (cm) 1.5 Width: (cm) 1.5 Depth: (cm) 0.3 Area: (cm) 1.767 Volume: (cm) 0.53 % Reduction in Area: 55% % Reduction in Volume: 83.1% Tunneling: No Undermining: No Wound Description Classification: Grade 2 Wound Margin: Distinct, outline attached Exudate Amount: Medium Exudate Type: Serosanguineous Exudate Color: red, brown Foul Odor After Cleansing: No Slough/Fibrino Yes Wound Bed Granulation Amount: None Present (0%) Exposed Structure Necrotic Amount: Large (67-100%) Fascia Exposed: No Necrotic Quality: Eschar Fat Layer (Subcutaneous Tissue) Exposed: No Tendon Exposed: No Muscle Exposed: No Joint Exposed: No Bone Exposed: No Treatment Notes Wound #3 (Lower Leg) Wound  Laterality: Left, Medial Cleanser Byram Ancillary Kit - 15 Day Supply Discharge Instruction: Use supplies as instructed; Kit contains: (15) Saline Bullets; (15) 3x3 Gauze; 15 pr Gloves Soap and Water [name], [name]. ([id number]) Discharge Instruction: Gently cleanse wound with antibacterial soap, rinse and pat dry prior to dressing wounds Peri-Wound Care Topical Primary Dressing Hydrofera Blue Ready  Transfer Foam, 2.5x2.5 (in/in) Discharge Instruction: Apply Hydrofera Blue Ready to wound bed as directed Secondary Dressing ABD Pad 5x9 (in/in) Discharge Instruction: Cover with ABD pad Secured With Medipore Tape - 62M Medipore H Soft Cloth Surgical Tape, 2x2 (in/yd) Kerlix Roll Sterile or Non-Sterile 6-ply 4.5x4 (yd/yd) Discharge Instruction: Apply Kerlix as directed Stretch Net Dressing, Latex-free, Size 5, Small-Head / Shoulder / Thigh Discharge Instruction: size 4 Compression Wrap Compression Stockings Add-Ons Electronic Signature(s) Signed: 10/10/2021 4:20:12 PM By: Rhae Hammock RN Entered By: Rhae Hammock on 10/10/2021 13:28:11 Barz, [name] ([id number]) -------------------------------------------------------------------------------- Vitals Details Patient Name: [name] Date of Service: 10/10/2021 10:00 AM Medical Record Number: [id number] Patient Account Number: [id number] Date of Birth/Sex: [birth year] (87 y.o. Female) Treating RN: Rhae Hammock Primary Care Kaleesi Guyton: [name] Other Clinician: Referring Katia Hannen: [name] Treating Spencer Cardinal/Extender: Skipper Cliche in Treatment: 10 Vital Signs Time Taken: 10:36 Temperature (F): 97.6 Height (in): 64 Pulse (bpm): 70 Respiratory Rate (breaths/min): 17 Blood Pressure (mmHg): 194/76 Reference Range: 80 - 120 mg / dl Electronic Signature(s) Signed: 10/10/2021 4:20:12 PM By: Rhae Hammock RN Entered By: Rhae Hammock on 10/10/2021 10:36:48

## 2021-10-10 NOTE — Progress Notes (Addendum)
[name], [name] ([id number]) Visit Report for 10/10/2021 Chief Complaint Document Details Patient Name: [name], [name]. Date of Service: 10/10/2021 10:00 AM Medical Record Number: [id number] Patient Account Number: [id number] Date of Birth/Sex: Jun 05, 1933 (88 y.o. Female) Treating RN: Rhae Hammock Primary Care Provider: [name] Other Clinician: Referring Provider: [name] Treating Provider/Extender: Skipper Cliche in Treatment: 10 Information Obtained from: Patient Chief Complaint Left LE Ulcers Electronic Signature(s) Signed: 10/10/2021 10:26:03 AM By: Worthy Keeler PA-C Entered By: Worthy Keeler on 10/10/2021 10:26:03 [place], [name] ([id number]) -------------------------------------------------------------------------------- Debridement Details Patient Name: [name] Date of Service: 10/10/2021 10:00 AM Medical Record Number: [id number] Patient Account Number: [id number] Date of Birth/Sex: October 15, 1933 (88 y.o. Female) Treating RN: Rhae Hammock Primary Care Provider: [name] Other Clinician: Referring Provider: [name] Treating Provider/Extender: Skipper Cliche in Treatment: 10 Debridement Performed for Wound #3 Left,Medial Lower Leg Assessment: Performed By: Physician Tommie Sams., PA-C Debridement Type: Debridement Severity of Tissue Pre Debridement: Fat layer exposed Level of Consciousness (Pre- Awake and Alert procedure): Pre-procedure Verification/Time Out Yes - 11:03 Taken: Start Time: 11:03 Pain Control: Lidocaine Total Area Debrided (L x W): 1.5 (cm) x 1.5 (cm) = 2.25 (cm) Tissue and other material Viable, Non-Viable, Slough, Subcutaneous, Skin: Dermis , Skin: Epidermis, Slough debrided: Level: Skin/Subcutaneous Tissue Debridement Description: Excisional Instrument: Curette Bleeding: Minimum Hemostasis Achieved: Pressure End Time: 11:03 Procedural Pain: 0 Post Procedural Pain: 0 Response to  Treatment: Procedure was tolerated well Level of Consciousness (Post- Awake and Alert procedure): Post Debridement Measurements of Total Wound Length: (cm) 0.4 Width: (cm) 1 Depth: (cm) 0.3 Volume: (cm) 0.094 Character of Wound/Ulcer Post Debridement: Improved Severity of Tissue Post Debridement: Fat layer exposed Post Procedure Diagnosis Same as Pre-procedure Electronic Signature(s) Signed: 10/10/2021 4:20:12 PM By: Rhae Hammock RN Signed: 10/10/2021 5:40:29 PM By: Worthy Keeler PA-C Entered By: Rhae Hammock on 10/10/2021 11:05:06 [place], [name] ([id number]) -------------------------------------------------------------------------------- Debridement Details Patient Name: [name] Date of Service: 10/10/2021 10:00 AM Medical Record Number: [id number] Patient Account Number: [id number] Date of Birth/Sex: January 30, 1934 (88 y.o. Female) Treating RN: Rhae Hammock Primary Care Provider: [name] Other Clinician: Referring Provider: [name] Treating Provider/Extender: Skipper Cliche in Treatment: 10 Debridement Performed for Wound #2 Left,Lateral Foot Assessment: Performed By: Physician Tommie Sams., PA-C Debridement Type: Debridement Severity of Tissue Pre Debridement: Fat layer exposed Level of Consciousness (Pre- Awake and Alert procedure): Pre-procedure Verification/Time Out Yes - 11:05 Taken: Start Time: 11:05 Pain Control: Lidocaine Total Area Debrided (L x W): 0.7 (cm) x 1 (cm) = 0.7 (cm) Tissue and other material Viable, Non-Viable, Slough, Subcutaneous, Skin: Dermis , Skin: Epidermis, Slough debrided: Level: Skin/Subcutaneous Tissue Debridement Description: Excisional Instrument: Curette Bleeding: Minimum Hemostasis Achieved: Pressure End Time: 11:05 Procedural Pain: 0 Post Procedural Pain: 0 Response to Treatment: Procedure was tolerated well Level of Consciousness (Post- Awake and Alert procedure): Post  Debridement Measurements of Total Wound Length: (cm) 0.7 Width: (cm) 1 Depth: (cm) 0.3 Volume: (cm) 0.165 Character of Wound/Ulcer Post Debridement: Improved Severity of Tissue Post Debridement: Fat layer exposed Post Procedure Diagnosis Same as Pre-procedure Electronic Signature(s) Signed: 10/10/2021 4:20:12 PM By: Rhae Hammock RN Signed: 10/10/2021 5:40:29 PM By: Worthy Keeler PA-C Entered By: Rhae Hammock on 10/10/2021 11:07:40 [place], [name] ([id number]) -------------------------------------------------------------------------------- HPI Details Patient Name: [name] Date of Service: 10/10/2021 10:00 AM Medical Record Number: [id number] Patient Account Number: [id number] Date of Birth/Sex: March 04, 1933 (88 y.o. Female) Treating RN: Rhae Hammock Primary Care Provider:  [name] Other Clinician: Referring Provider: [name] Treating Provider/Extender: Skipper Cliche in Treatment: 10 History of Present Illness HPI Description: 08-01-2021 upon evaluation today patient appears to be doing better in regard to her wounds over the foot. This on the left foot as well as the lower extremity medially was all due to a peripheral vascular issue and arterial insufficiency. Subsequently the patient did undergo intervention in order to clear this away as far as the blockage was concerned. Dr. Delana Meyer performed an arteriogram on 06-11-2021. She had a follow-up ABI/TBI performed on 07-11-2021. This showed that there was a post intervention ABI of zero 1.09 on the left with heel monophasic today toe pressure of 0.37 but nonetheless the patient seems to be doing much better and actually appears to have the ability to heal at this time overall extremely pleased with what I am seeing and there is some need for sharp debridement today we will work on that at this point. Currently there is going to need to be some fairly aggressive debridement it does appear to be at  this point. Patient has a history of diabetes mellitus type 2, peripheral vascular disease, and dementia. She is seen with her son who actually lives behind her and the patient actually lives alone but he takes good care of her and appears. 08-15-2021 upon evaluation today patient actually appears to be doing awesome in regard to her wounds. She has been tolerating the dressing changes without complication. Fortunately there does not appear to be any signs of active infection locally or systemically at this time. No fevers, chills, nausea, vomiting, or diarrhea. 08-29-2021 upon evaluation today patient appears to be doing well currently in regard to her wound. She has been tolerating the dressing changes without complication all 3 wounds are showing signs of significant improvement. Fortunately I do not see any evidence of active infection locally or systemically at this time. 09-12-2021 upon evaluation today patient appears to be doing well with regard to her wounds. There is some need for sharp debridement fortunately there does not appear to be any signs of active infection locally or systemically at this time. No fevers, chills, nausea, vomiting, or diarrhea. 09-26-2021 upon evaluation today patient appears to be doing better in regard to her wounds across the board everything is measuring smaller and looks to be doing excellent. I am very pleased with where we stand today. 10-10-2021 upon evaluation today patient appears to be doing well currently in regard to her wounds. In fact the leg ulcer medially as well as the heel ulcer laterally seem to be excellent in fact the lateral heel I think is healed completely. The lateral foot is the worst of the wounds but still seems to be doing decently well based on what I am seeing. Fortunately there is no signs of active infection at this time. Electronic Signature(s) Signed: 10/10/2021 11:10:15 AM By: Worthy Keeler PA-C Entered By: Worthy Keeler on  10/10/2021 11:10:15 [place], [name] ([id number]) -------------------------------------------------------------------------------- Physical Exam Details Patient Name: Kathol, [name]. Date of Service: 10/10/2021 10:00 AM Medical Record Number: [id number] Patient Account Number: [id number] Date of Birth/Sex: 12-21-1933 (88 y.o. Female) Treating RN: Rhae Hammock Primary Care Provider: [name] Other Clinician: Referring Provider: [name] Treating Provider/Extender: Skipper Cliche in Treatment: 19 Constitutional Well-nourished and well-hydrated in no acute distress. Respiratory normal breathing without difficulty. Psychiatric this patient is able to make decisions and demonstrates good insight into disease process. Alert and Oriented x 3. pleasant  and cooperative. Notes Upon inspection patient's wound bed actually showed signs of good granulation and epithelization at this point. Fortunately there does not appear to be any signs of active infection locally or systemically which is great news and overall I am extremely pleased with where we stand at this point. Electronic Signature(s) Signed: 10/10/2021 11:10:32 AM By: Worthy Keeler PA-C Entered By: Worthy Keeler on 10/10/2021 11:10:32 [place], [name] ([id number]) -------------------------------------------------------------------------------- Physician Orders Details Patient Name: [name] Date of Service: 10/10/2021 10:00 AM Medical Record Number: [id number] Patient Account Number: [id number] Date of Birth/Sex: [birth year] (88 y.o. Female) Treating RN: Rhae Hammock Primary Care Provider: [name] Other Clinician: Referring Provider: [name] Treating Provider/Extender: Skipper Cliche in Treatment: 10 Verbal / Phone Orders: No Diagnosis Coding ICD-10 Coding Code Description I73.89 Other specified peripheral vascular diseases E11.621 Type 2 diabetes mellitus with foot  ulcer L97.522 Non-pressure chronic ulcer of other part of left foot with fat layer exposed L97.822 Non-pressure chronic ulcer of other part of left lower leg with fat layer exposed L97.422 Non-pressure chronic ulcer of left heel and midfoot with fat layer exposed Vascular dementia, unspecified severity, without behavioral disturbance, psychotic disturbance, mood disturbance, and F01.50 anxiety Follow-up Appointments o Return Appointment in 2 weeks. o Nurse Visit as needed [employer] o Wash wounds with antibacterial soap and water. - dial soap recommended o May shower; gently cleanse wound with antibacterial soap, rinse and pat dry prior to dressing wounds o No tub bath. Anesthetic (Use 'Patient Medications' Section for Anesthetic Order Entry) o Lidocaine applied to wound bed Edema Control - Lymphedema / Segmental Compressive Device / Other o Elevate, Exercise Daily and Avoid Standing for Long Periods of Time. o Elevate leg(s) parallel to the floor when sitting. o DO YOUR BEST to sleep in the bed at night. DO NOT sleep in your recliner. Long hours of sitting in a recliner leads to swelling of the legs and/or potential wounds on your backside. Off-Loading o Turn and reposition every 2 hours o Other: - keep heels off bed when sleeping, do best to keep pressure off heels Wound Treatment Wound #2 - Foot Wound Laterality: Left, Lateral Cleanser: Byram Ancillary Kit - 15 Day Supply (Generic) 1 x Per Day/30 Days Discharge Instructions: Use supplies as instructed; Kit contains: (15) Saline Bullets; (15) 3x3 Gauze; 15 pr Gloves Cleanser: Soap and Water 1 x Per Day/30 Days Discharge Instructions: Gently cleanse wound with antibacterial soap, rinse and pat dry prior to dressing wounds Primary Dressing: Hydrofera Blue Ready Transfer Foam, 2.5x2.5 (in/in) (Generic) 1 x Per Day/30 Days Discharge Instructions: Apply Hydrofera Blue Ready to wound bed as  directed Secondary Dressing: ABD Pad 5x9 (in/in) (Generic) 1 x Per Day/30 Days Discharge Instructions: Cover with ABD pad Secured With: Medipore Tape - 38M Medipore H Soft Cloth Surgical Tape, 2x2 (in/yd) (Generic) 1 x Per Day/30 Days Secured With: Kerlix Roll Sterile or Non-Sterile 6-ply 4.5x4 (yd/yd) (DME) (Generic) 1 x Per Day/30 Days Discharge Instructions: Apply Kerlix as directed Secured With: Stretch Net Dressing, Latex-free, Size 5, Small-Head / Shoulder / Thigh 1 x Per Day/30 Days Discharge Instructions: size 4 [place], Lashannon G. ([id number]) Wound #3 - Lower Leg Wound Laterality: Left, Medial Cleanser: Byram Ancillary Kit - 15 Day Supply (Generic) 1 x Per Day/30 Days Discharge Instructions: Use supplies as instructed; Kit contains: (15) Saline Bullets; (15) 3x3 Gauze; 15 pr Gloves Cleanser: Soap and Water 1 x Per Day/30 Days Discharge Instructions: Gently cleanse wound with antibacterial  soap, rinse and pat dry prior to dressing wounds Primary Dressing: Hydrofera Blue Ready Transfer Foam, 2.5x2.5 (in/in) (Generic) 1 x Per Day/30 Days Discharge Instructions: Apply Hydrofera Blue Ready to wound bed as directed Secondary Dressing: ABD Pad 5x9 (in/in) (Generic) 1 x Per Day/30 Days Discharge Instructions: Cover with ABD pad Secured With: Medipore Tape - 68M Medipore H Soft Cloth Surgical Tape, 2x2 (in/yd) (Generic) 1 x Per Day/30 Days Secured With: Kerlix Roll Sterile or Non-Sterile 6-ply 4.5x4 (yd/yd) (DME) (Generic) 1 x Per Day/30 Days Discharge Instructions: Apply Kerlix as directed Secured With: Stretch Net Dressing, Latex-free, Size 5, Small-Head / Shoulder / Thigh 1 x Per Day/30 Days Discharge Instructions: size 4 Electronic Signature(s) Signed: 10/10/2021 4:20:12 PM By: Rhae Hammock RN Signed: 10/10/2021 5:40:29 PM By: Worthy Keeler PA-C Entered By: Rhae Hammock on 10/10/2021 16:18:07 [place], [name]  ([id number]) -------------------------------------------------------------------------------- Problem List Details Patient Name: [name]. Date of Service: 10/10/2021 10:00 AM Medical Record Number: [id number] Patient Account Number: [id number] Date of Birth/Sex: 06-16-1933 (88 y.o. Female) Treating RN: Rhae Hammock Primary Care Provider: [name] Other Clinician: Referring Provider: [name] Treating Provider/Extender: Skipper Cliche in Treatment: 10 Active Problems ICD-10 Encounter Code Description Active Date MDM Diagnosis I73.89 Other specified peripheral vascular diseases 08/01/2021 No Yes E11.621 Type 2 diabetes mellitus with foot ulcer 08/01/2021 No Yes L97.522 Non-pressure chronic ulcer of other part of left foot with fat layer 08/01/2021 No Yes exposed L97.822 Non-pressure chronic ulcer of other part of left lower leg with fat layer 08/01/2021 No Yes exposed L97.422 Non-pressure chronic ulcer of left heel and midfoot with fat layer 08/01/2021 No Yes exposed F01.50 Vascular dementia, unspecified severity, without behavioral disturbance, 08/01/2021 No Yes psychotic disturbance, mood disturbance, and anxiety Inactive Problems Resolved Problems Electronic Signature(s) Signed: 10/10/2021 10:25:55 AM By: Worthy Keeler PA-C Entered By: Worthy Keeler on 10/10/2021 10:25:55 [place], [name] ([id number]) -------------------------------------------------------------------------------- Progress Note Details Patient Name: [name] Date of Service: 10/10/2021 10:00 AM Medical Record Number: [id number] Patient Account Number: [id number] Date of Birth/Sex: September 13, 1933 (88 y.o. Female) Treating RN: Rhae Hammock Primary Care Provider: [name] Other Clinician: Referring Provider: [name] Treating Provider/Extender: Skipper Cliche in Treatment: 10 Subjective Chief Complaint Information obtained from Patient Left LE  Ulcers History of Present Illness (HPI) 08-01-2021 upon evaluation today patient appears to be doing better in regard to her wounds over the foot. This on the left foot as well as the lower extremity medially was all due to a peripheral vascular issue and arterial insufficiency. Subsequently the patient did undergo intervention in order to clear this away as far as the blockage was concerned. Dr. Delana Meyer performed an arteriogram on 06-11-2021. She had a follow-up ABI/TBI performed on 07-11-2021. This showed that there was a post intervention ABI of zero 1.09 on the left with heel monophasic today toe pressure of 0.37 but nonetheless the patient seems to be doing much better and actually appears to have the ability to heal at this time overall extremely pleased with what I am seeing and there is some need for sharp debridement today we will work on that at this point. Currently there is going to need to be some fairly aggressive debridement it does appear to be at this point. Patient has a history of diabetes mellitus type 2, peripheral vascular disease, and dementia. She is seen with her son who actually lives behind her and the patient actually lives alone but he takes good care of her and  appears. 08-15-2021 upon evaluation today patient actually appears to be doing awesome in regard to her wounds. She has been tolerating the dressing changes without complication. Fortunately there does not appear to be any signs of active infection locally or systemically at this time. No fevers, chills, nausea, vomiting, or diarrhea. 08-29-2021 upon evaluation today patient appears to be doing well currently in regard to her wound. She has been tolerating the dressing changes without complication all 3 wounds are showing signs of significant improvement. Fortunately I do not see any evidence of active infection locally or systemically at this time. 09-12-2021 upon evaluation today patient appears to be doing well with  regard to her wounds. There is some need for sharp debridement fortunately there does not appear to be any signs of active infection locally or systemically at this time. No fevers, chills, nausea, vomiting, or diarrhea. 09-26-2021 upon evaluation today patient appears to be doing better in regard to her wounds across the board everything is measuring smaller and looks to be doing excellent. I am very pleased with where we stand today. 10-10-2021 upon evaluation today patient appears to be doing well currently in regard to her wounds. In fact the leg ulcer medially as well as the heel ulcer laterally seem to be excellent in fact the lateral heel I think is healed completely. The lateral foot is the worst of the wounds but still seems to be doing decently well based on what I am seeing. Fortunately there is no signs of active infection at this time. Objective Constitutional Well-nourished and well-hydrated in no acute distress. Vitals Time Taken: 10:36 AM, Height: 64 in, Temperature: 97.6 F, Pulse: 70 bpm, Respiratory Rate: 17 breaths/min, Blood Pressure: 194/76 mmHg. Respiratory normal breathing without difficulty. Psychiatric this patient is able to make decisions and demonstrates good insight into disease process. Alert and Oriented x 3. pleasant and cooperative. General Notes: Upon inspection patient's wound bed actually showed signs of good granulation and epithelization at this point. Fortunately there does not appear to be any signs of active infection locally or systemically which is great news and overall I am extremely pleased with where we stand at this point. [place], [place]. ([id number]) Integumentary (Hair, Skin) Wound #1 status is Open. Original cause of wound was Gradually Appeared. The date acquired was: 06/26/2021. The wound has been in treatment 10 weeks. The wound is located on the Left Calcaneus. The wound measures 0cm length x 0cm width x 0cm depth; 0cm^2 area and  0cm^3 volume. There is no tunneling or undermining noted. There is a medium amount of serosanguineous drainage noted. The wound margin is distinct with the outline attached to the wound base. There is large (67-100%) red, pink granulation within the wound bed. Wound #2 status is Open. Original cause of wound was Gradually Appeared. The date acquired was: 06/26/2021. The wound has been in treatment 10 weeks. The wound is located on the Left,Lateral Foot. The wound measures 0.7cm length x 1cm width x 0.3cm depth; 0.55cm^2 area and 0.165cm^3 volume. There is Fat Layer (Subcutaneous Tissue) exposed. There is no tunneling or undermining noted. There is a medium amount of serosanguineous drainage noted. There is large (67-100%) red, pink granulation within the wound bed. There is a small (1-33%) amount of necrotic tissue within the wound bed including Adherent Slough. Wound #3 status is Open. Original cause of wound was Gradually Appeared. The date acquired was: 06/26/2021. The wound has been in treatment 10 weeks. The wound is located on the Left,Medial  Lower Leg. The wound measures 1.5cm length x 1.5cm width x 0.3cm depth; 1.767cm^2 area and 0.53cm^3 volume. There is no tunneling or undermining noted. There is a medium amount of serosanguineous drainage noted. The wound margin is distinct with the outline attached to the wound base. There is no granulation within the wound bed. There is a large (67-100%) amount of necrotic tissue within the wound bed including Eschar. Assessment Active Problems ICD-10 Other specified peripheral vascular diseases Type 2 diabetes mellitus with foot ulcer Non-pressure chronic ulcer of other part of left foot with fat layer exposed Non-pressure chronic ulcer of other part of left lower leg with fat layer exposed Non-pressure chronic ulcer of left heel and midfoot with fat layer exposed Vascular dementia, unspecified severity, without behavioral disturbance, psychotic  disturbance, mood disturbance, and anxiety Procedures Wound #2 Pre-procedure diagnosis of Wound #2 is a Diabetic Wound/Ulcer of the Lower Extremity located on the Left,Lateral Foot .Severity of Tissue Pre Debridement is: Fat layer exposed. There was a Excisional Skin/Subcutaneous Tissue Debridement with a total area of 0.7 sq cm performed by Tommie Sams., PA-C. With the following instrument(s): Curette to remove Viable and Non-Viable tissue/material. Material removed includes Subcutaneous Tissue, Slough, Skin: Dermis, and Skin: Epidermis after achieving pain control using Lidocaine. No specimens were taken. A time out was conducted at 11:05, prior to the start of the procedure. A Minimum amount of bleeding was controlled with Pressure. The procedure was tolerated well with a pain level of 0 throughout and a pain level of 0 following the procedure. Post Debridement Measurements: 0.7cm length x 1cm width x 0.3cm depth; 0.165cm^3 volume. Character of Wound/Ulcer Post Debridement is improved. Severity of Tissue Post Debridement is: Fat layer exposed. Post procedure Diagnosis Wound #2: Same as Pre-Procedure Wound #3 Pre-procedure diagnosis of Wound #3 is a Diabetic Wound/Ulcer of the Lower Extremity located on the Left,Medial Lower Leg .Severity of Tissue Pre Debridement is: Fat layer exposed. There was a Excisional Skin/Subcutaneous Tissue Debridement with a total area of 2.25 sq cm performed by Tommie Sams., PA-C. With the following instrument(s): Curette to remove Viable and Non-Viable tissue/material. Material removed includes Subcutaneous Tissue, Slough, Skin: Dermis, and Skin: Epidermis after achieving pain control using Lidocaine. No specimens were taken. A time out was conducted at 11:03, prior to the start of the procedure. A Minimum amount of bleeding was controlled with Pressure. The procedure was tolerated well with a pain level of 0 throughout and a pain level of 0 following the  procedure. Post Debridement Measurements: 0.4cm length x 1cm width x 0.3cm depth; 0.094cm^3 volume. Character of Wound/Ulcer Post Debridement is improved. Severity of Tissue Post Debridement is: Fat layer exposed. Post procedure Diagnosis Wound #3: Same as Pre-Procedure Plan Follow-up Appointments: Return Appointment in 2 weeks. Nurse Visit as needed Bathing/ Shower/ Hygiene: Wash wounds with antibacterial soap and water. - dial soap recommended CONCETTA, GUION ([id number]) May shower; gently cleanse wound with antibacterial soap, rinse and pat dry prior to dressing wounds No tub bath. Anesthetic (Use 'Patient Medications' Section for Anesthetic Order Entry): Lidocaine applied to wound bed Edema Control - Lymphedema / Segmental Compressive Device / Other: Elevate, Exercise Daily and Avoid Standing for Long Periods of Time. Elevate leg(s) parallel to the floor when sitting. DO YOUR BEST to sleep in the bed at night. DO NOT sleep in your recliner. Long hours of sitting in a recliner leads to swelling of the legs and/or potential wounds on your backside. Off-Loading: Turn and reposition every  2 hours Other: - keep heels off bed when sleeping, do best to keep pressure off heels WOUND #1: - Calcaneus Wound Laterality: Left Cleanser: [employer] Ancillary Kit - 15 Day Supply (Generic) 3 x Per Week/30 Days Discharge Instructions: Use supplies as instructed; Kit contains: (15) Saline Bullets; (15) 3x3 Gauze; 15 pr Gloves Cleanser: Soap and Water 3 x Per Week/30 Days Discharge Instructions: Gently cleanse wound with antibacterial soap, rinse and pat dry prior to dressing wounds Primary Dressing: Hydrofera Blue Ready Transfer Foam, 2.5x2.5 (in/in) (Generic) 3 x Per Week/30 Days Discharge Instructions: Apply Hydrofera Blue Ready to wound bed as directed Secondary Dressing: ABD Pad 5x9 (in/in) (Generic) 3 x Per Week/30 Days Discharge Instructions: Cover with ABD pad Secured With: Medipore Tape - 77M  Medipore H Soft Cloth Surgical Tape, 2x2 (in/yd) (Generic) 3 x Per Week/30 Days Secured With: [employer] Sterile or Non-Sterile 6-ply 4.5x4 (yd/yd) (Generic) 3 x Per Week/30 Days Discharge Instructions: Apply Kerlix as directed Secured With: [employer] Dressing, Latex-free, Size 5, Small-Head / Shoulder / Thigh 3 x Per Week/30 Days Discharge Instructions: size 4 WOUND #2: - Foot Wound Laterality: Left, Lateral Cleanser: [employer] Ancillary Kit - 15 Day Supply (Generic) 3 x Per Week/30 Days Discharge Instructions: Use supplies as instructed; Kit contains: (15) Saline Bullets; (15) 3x3 Gauze; 15 pr Gloves Cleanser: Soap and Water 3 x Per Week/30 Days Discharge Instructions: Gently cleanse wound with antibacterial soap, rinse and pat dry prior to dressing wounds Primary Dressing: Hydrofera Blue Ready Transfer Foam, 2.5x2.5 (in/in) (Generic) 3 x Per Week/30 Days Discharge Instructions: Apply Hydrofera Blue Ready to wound bed as directed Secondary Dressing: ABD Pad 5x9 (in/in) (Generic) 3 x Per Week/30 Days Discharge Instructions: Cover with ABD pad Secured With: Medipore Tape - 77M Medipore H Soft Cloth Surgical Tape, 2x2 (in/yd) (Generic) 3 x Per Week/30 Days Secured With: [employer] Sterile or Non-Sterile 6-ply 4.5x4 (yd/yd) (Generic) 3 x Per Week/30 Days Discharge Instructions: Apply Kerlix as directed Secured With: [employer] Dressing, Latex-free, Size 5, Small-Head / Shoulder / Thigh 3 x Per Week/30 Days Discharge Instructions: size 4 WOUND #3: - Lower Leg Wound Laterality: Left, Medial Cleanser: [employer] Ancillary Kit - 15 Day Supply (Generic) 3 x Per Week/30 Days Discharge Instructions: Use supplies as instructed; Kit contains: (15) Saline Bullets; (15) 3x3 Gauze; 15 pr Gloves Cleanser: Soap and Water 3 x Per Week/30 Days Discharge Instructions: Gently cleanse wound with antibacterial soap, rinse and pat dry prior to dressing wounds Primary Dressing: Hydrofera Blue Ready Transfer Foam,  2.5x2.5 (in/in) (Generic) 3 x Per Week/30 Days Discharge Instructions: Apply Hydrofera Blue Ready to wound bed as directed Secondary Dressing: ABD Pad 5x9 (in/in) (Generic) 3 x Per Week/30 Days Discharge Instructions: Cover with ABD pad Secured With: Medipore Tape - 77M Medipore H Soft Cloth Surgical Tape, 2x2 (in/yd) (Generic) 3 x Per Week/30 Days Secured With: [employer] Sterile or Non-Sterile 6-ply 4.5x4 (yd/yd) (Generic) 3 x Per Week/30 Days Discharge Instructions: Apply Kerlix as directed Secured With: [employer] Dressing, Latex-free, Size 5, Small-Head / Shoulder / Thigh 3 x Per Week/30 Days Discharge Instructions: size 4 1. I would recommend currently that we go ahead and continue with the [facility] which I think is doing quite well were down to just 2 wounds now and they are looking excellent. 2. I am also can recommend we have the patient continue with appropriate offloading she is doing a good job with that her son is doing a great job taking care  of the wounds as far as dressings are concerned. We will see patient back for reevaluation in 2 weeks here in the clinic. If anything worsens or changes patient will contact our office for additional recommendations. Electronic Signature(s) Signed: 10/10/2021 11:11:00 AM By: Worthy Keeler PA-C Entered By: Worthy Keeler on 10/10/2021 11:11:00 Java, [name] ([id number]) -------------------------------------------------------------------------------- SuperBill Details Patient Name: [name] Date of Service: 10/10/2021 Medical Record Number: [id number] Patient Account Number: [id number] Date of Birth/Sex: 08-19-1933 (88 y.o. 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 Vascular dementia, unspecified severity, without behavioral disturbance, psychotic disturbance, mood disturbance, and F01.50 anxiety Facility Procedures CPT4 Code: 80699967 Description: 11042 - DEB SUBQ TISSUE 20 SQ CM/< Modifier: Quantity: 1 CPT4 Code: Description: ICD-10 Diagnosis Description L97.822 Non-pressure chronic ulcer of other part of left lower leg with fat layer L97.522 Non-pressure chronic ulcer of other part of left foot with fat layer expo Modifier: exposed sed Quantity: Physician Procedures CPT4 Code: [id number] Description: 11042 - WC PHYS SUBQ TISS 20 SQ CM Modifier: Quantity: 1 CPT4 Code: Description: ICD-10 Diagnosis Description L97.822 Non-pressure chronic ulcer of other part of left lower leg with fat layer L97.522 Non-pressure chronic ulcer of other part of left foot with fat layer expo Modifier: exposed sed Quantity: Electronic Signature(s) Signed: 10/10/2021 11:11:27 AM By: Worthy Keeler PA-C Entered By: Worthy Keeler on 10/10/2021 11:11:26

## 2021-10-11 DIAGNOSIS — E11621 Type 2 diabetes mellitus with foot ulcer: Secondary | ICD-10-CM | POA: Diagnosis not present

## 2021-10-11 DIAGNOSIS — L97422 Non-pressure chronic ulcer of left heel and midfoot with fat layer exposed: Secondary | ICD-10-CM | POA: Diagnosis not present

## 2021-10-11 DIAGNOSIS — L97822 Non-pressure chronic ulcer of other part of left lower leg with fat layer exposed: Secondary | ICD-10-CM | POA: Diagnosis not present

## 2021-10-11 DIAGNOSIS — I7389 Other specified peripheral vascular diseases: Secondary | ICD-10-CM | POA: Diagnosis not present

## 2021-10-24 ENCOUNTER — Ambulatory Visit: Payer: Medicare HMO | Admitting: Physician Assistant

## 2021-11-06 ENCOUNTER — Other Ambulatory Visit
Admission: RE | Admit: 2021-11-06 | Discharge: 2021-11-06 | Disposition: A | Discharge status: Home / Self Care | Payer: Medicare HMO | Source: Ambulatory Visit | Attending: Internal Medicine | Admitting: Internal Medicine

## 2021-11-06 ENCOUNTER — Encounter: Payer: Medicare HMO | Attending: Internal Medicine | Admitting: Internal Medicine

## 2021-11-06 ENCOUNTER — Other Ambulatory Visit (HOSPITAL_BASED_OUTPATIENT_CLINIC_OR_DEPARTMENT_OTHER): Payer: Self-pay | Admitting: Internal Medicine

## 2021-11-06 DIAGNOSIS — E1151 Type 2 diabetes mellitus with diabetic peripheral angiopathy without gangrene: Secondary | ICD-10-CM | POA: Insufficient documentation

## 2021-11-06 DIAGNOSIS — E11621 Type 2 diabetes mellitus with foot ulcer: Secondary | ICD-10-CM | POA: Insufficient documentation

## 2021-11-06 DIAGNOSIS — B999 Unspecified infectious disease: Secondary | ICD-10-CM | POA: Diagnosis not present

## 2021-11-06 DIAGNOSIS — I7389 Other specified peripheral vascular diseases: Secondary | ICD-10-CM | POA: Diagnosis not present

## 2021-11-06 DIAGNOSIS — F0150 Vascular dementia without behavioral disturbance: Secondary | ICD-10-CM | POA: Insufficient documentation

## 2021-11-06 DIAGNOSIS — L97822 Non-pressure chronic ulcer of other part of left lower leg with fat layer exposed: Secondary | ICD-10-CM | POA: Insufficient documentation

## 2021-11-06 DIAGNOSIS — L97522 Non-pressure chronic ulcer of other part of left foot with fat layer exposed: Secondary | ICD-10-CM | POA: Diagnosis not present

## 2021-11-06 DIAGNOSIS — E11622 Type 2 diabetes mellitus with other skin ulcer: Secondary | ICD-10-CM | POA: Insufficient documentation

## 2021-11-06 NOTE — Progress Notes (Addendum)
LATESA, FRATTO ([id number]) [id number] Page 1 of 8 Visit Report for 11/06/2021 Chief Complaint Document Details Patient Name: Date of Service: [name] 11/06/2021 11:30 A M Medical Record Number: [id number] Patient Account Number: [id number] Date of Birth/Sex: Treating RN: [birth year] (88 y.o. [name] Primary Care Provider: [name] Other Clinician: Referring Provider: Treating Provider/Extender: Ruthy Dick in Treatment: 13 Information Obtained from: Patient Chief Complaint Left LE Ulcers Electronic Signature(s) Signed: 11/06/2021 1:40:43 PM By: Geralyn Corwin DO Entered By: Geralyn Corwin on 11/06/2021 12:47:28 -------------------------------------------------------------------------------- Debridement Details Patient Name: Date of Service: [name], Cinda Quest. 11/06/2021 11:30 A M Medical Record Number: [id number] Patient Account Number: [id number] Date of Birth/Sex: Treating RN: [birth year] (88 y.o. [name] Primary Care Provider: [name] Other Clinician: Referring Provider: Treating Provider/Extender: Ruthy Dick in Treatment: 13 Debridement Performed for Assessment: Wound #2 Left,Lateral Foot Performed By: Physician Geralyn Corwin, MD Debridement Type: Debridement Severity of Tissue Pre Debridement: Fat layer exposed Level of Consciousness (Pre-procedure): Awake and Alert Pre-procedure Verification/Time Out Yes - 12:34 Taken: T Area Debrided (L x W): otal 0.8 (cm) x 0.8 (cm) = 0.64 (cm) Tissue and other material debrided: Viable, Non-Viable, Bone, Slough, Subcutaneous, Slough Level: Skin/Subcutaneous Tissue/Muscle/Bone Debridement Description: Excisional Instrument: Curette Bleeding: Minimum Hemostasis Achieved: Pressure Response to Treatment: Procedure was tolerated well Level of Consciousness (Post- Awake and Alert procedure): Post Debridement  Measurements of Total Wound White[place], Marlyce G ([id number]) [id number] Page 2 of 8 Length: (cm) 0.8 Width: (cm) 0.8 Depth: (cm) 0.3 Volume: (cm) 0.151 Character of Wound/Ulcer Post Debridement: Stable Severity of Tissue Post Debridement: Bone involvement without necrosis Post Procedure Diagnosis Same as Pre-procedure Electronic Signature(s) Signed: 11/06/2021 1:40:43 PM By: Geralyn Corwin DO Signed: 11/06/2021 5:32:28 PM By: Elliot Gurney, BSN, RN, CWS, [name] RN, BSN Entered By: Elliot Gurney, BSN, RN, CWS, [name] on 11/06/2021 12:44:37 -------------------------------------------------------------------------------- HPI Details Patient Name: Date of Service: [name], Cinda Quest. 11/06/2021 11:30 A M Medical Record Number: [id number] Patient Account Number: [id number] Date of Birth/Sex: Treating RN: Sep 21, 1933 (87 y.o. [name] Primary Care Provider: [name] Other Clinician: Referring Provider: Treating Provider/Extender: Ruthy Dick in Treatment: 13 History of Present Illness HPI Description: 08-01-2021 upon evaluation today patient appears to be doing better in regard to her wounds over the foot. This on the left foot as well as the lower extremity medially was all due to a peripheral vascular issue and arterial insufficiency. Subsequently the patient did undergo intervention in order to clear this away as far as the blockage was concerned. Dr. Gilda Crease performed an arteriogram on 06-11-2021. She had a follow-up ABI/TBI performed on 07-11-2021. This showed that there was a post intervention ABI of zero 1.09 on the left with heel monophasic today toe pressure of 0.37 but nonetheless the patient seems to be doing much better and actually appears to have the ability to heal at this time overall extremely pleased with what I am seeing and there is some need for sharp debridement today we will work on that at this point. Currently there is going to  need to be some fairly aggressive debridement it does appear to be at this point. Patient has a history of diabetes mellitus type 2, peripheral vascular disease, and dementia. She is seen with her son who actually lives behind her and the patient actually lives alone but he takes good care of her and appears. 08-15-2021 upon evaluation today patient actually appears to be  doing awesome in regard to her wounds. She has been tolerating the dressing changes without complication. Fortunately there does not appear to be any signs of active infection locally or systemically at this time. No fevers, chills, nausea, vomiting, or diarrhea. 08-29-2021 upon evaluation today patient appears to be doing well currently in regard to her wound. She has been tolerating the dressing changes without complication all 3 wounds are showing signs of significant improvement. Fortunately I do not see any evidence of active infection locally or systemically at this time. 09-12-2021 upon evaluation today patient appears to be doing well with regard to her wounds. There is some need for sharp debridement fortunately there does not appear to be any signs of active infection locally or systemically at this time. No fevers, chills, nausea, vomiting, or diarrhea. 09-26-2021 upon evaluation today patient appears to be doing better in regard to her wounds across the board everything is measuring smaller and looks to be doing excellent. I am very pleased with where we stand today. 10-10-2021 upon evaluation today patient appears to be doing well currently in regard to her wounds. In fact the leg ulcer medially as well as the heel ulcer laterally seem to be excellent in fact the lateral heel I think is healed completely. The lateral foot is the worst of the wounds but still seems to be doing decently well based on what I am seeing. Fortunately there is no signs of active infection at this time. 9/27; patient presents for follow-up. She  follows with our physician assistant and has not been seen in close to a month. She reports pain to the lateral aspect of the left foot. She states that the medial aspect has scabbed over and is dry with no drainage over the past week. She denies fever/chills, nausea/vomiting or increased warmth or erythema to the wound beds. Electronic Signature(s) Signed: 11/06/2021 1:40:43 PM By: Kalman Shan DO Entered By: Kalman Shan on 11/06/2021 12:49:33 [place], [name] ([id number]) [id number] Page 3 of 8 -------------------------------------------------------------------------------- Physical Exam Details Patient Name: Date of Service: [name] 11/06/2021 11:30 A M Medical Record Number: [id number] Patient Account Number: [id number] Date of Birth/Sex: Treating RN: 31-May-1933 (88 y.o. [name] Primary Care Provider: [name] Other Clinician: Referring Provider: Treating Provider/Extender: Caffie Damme in Treatment: 13 Constitutional . Cardiovascular . Psychiatric . Notes Left foot: T the lateral aspect there is an open wound that probes to bone. She has tenderness on exam. No increased warmth, erythema or purulent drainage o currently. T the medial aspect there is a dried scab. o Electronic Signature(s) Signed: 11/06/2021 1:40:43 PM By: Kalman Shan DO Entered By: Kalman Shan on 11/06/2021 12:50:23 -------------------------------------------------------------------------------- Physician Orders Details Patient Name: Date of Service: [name], [name]. 11/06/2021 11:30 A M Medical Record Number: [id number] Patient Account Number: [id number] Date of Birth/Sex: Treating RN: 11-03-1933 (88 y.o. [name], [name] Primary Care Provider: [name] Other Clinician: Referring Provider: Treating Provider/Extender: Caffie Damme in Treatment: 13 Verbal / Phone Orders:  No Diagnosis Coding ICD-10 Coding Code Description I73.89 Other specified peripheral vascular diseases E11.621 Type 2 diabetes mellitus with foot ulcer L97.522 Non-pressure chronic ulcer of other part of left foot with fat layer exposed L97.822 Non-pressure chronic ulcer of other part of left lower leg with fat layer exposed L97.422 Non-pressure chronic ulcer of left heel and midfoot with fat layer exposed F01.50 Vascular dementia, unspecified severity, without behavioral disturbance, psychotic disturbance, mood disturbance, and anxiety  MALISSA, SLAY ([id number]) [id number] Page 4 of 8 Follow-up Appointments Return Appointment in 1 week. Nurse Visit as needed [employer] wounds with antibacterial soap and water. May shower; gently cleanse wound with antibacterial soap, rinse and pat dry prior to dressing wounds No tub bath. Anesthetic (Use 'Patient Medications' Section for Anesthetic Order Entry) Lidocaine applied to wound bed Edema Control - Lymphedema / Segmental Compressive Device / Other Elevate, Exercise Daily and A void Standing for Long Periods of Time. Elevate legs to the level of the heart and pump ankles as often as possible Elevate leg(s) parallel to the floor when sitting. DO YOUR BEST to sleep in the bed at night. DO NOT sleep in your recliner. Long hours of sitting in a recliner leads to swelling of the legs and/or potential wounds on your backside. Off-Loading Turn and reposition every 2 hours Other: - float heels when in the bed. do your best to keep the pressure off of the heels. Wound Treatment Wound #2 - Foot Wound Laterality: Left, Lateral Cleanser: Soap and Water (Home Health) 1 x Per Day/30 Days Discharge Instructions: Gently cleanse wound with antibacterial soap, rinse and pat dry prior to dressing wounds Prim Dressing: Gauze 1 x Per Day/30 Days ary Discharge Instructions: As directed: moistened with Dakins  Solution Secondary Dressing: Gauze 1 x Per Day/30 Days Discharge Instructions: As directed: dry, moistened with saline or moistened with Dakins Solution Secured With: Conform 4'' - Conforming Stretch Gauze Bandage 4x75 (in/in) 1 x Per Day/30 Days Discharge Instructions: Apply as directed Laboratory Bacteria identified in Wound by Culture (MICRO) - Left lateral foot-bone LOINC Code: 6462-6 Convenience Name: Wound culture routine Tissue Pathology biopsy report (PATH) - Left Lateral foot-bone LOINC Code: 84166-0 Convenience Name: Tiss Path Bx report Electronic Signature(s) Signed: 11/11/2021 5:32:22 PM By: Elliot Gurney, BSN, RN, CWS, [name] RN, BSN Signed: 11/27/2021 11:21:14 AM By: Geralyn Corwin DO Previous Signature: 11/11/2021 3:10:27 PM Version By: Elliot Gurney, BSN, RN, CWS, [name] RN, BSN Previous Signature: 11/06/2021 1:40:43 PM Version By: Geralyn Corwin DO Previous Signature: 11/06/2021 12:57:22 PM Version By: Geralyn Corwin DO Entered By: Elliot Gurney BSN, RN, CWS, [name] on 11/11/2021 15:12:30 -------------------------------------------------------------------------------- Problem List Details Patient Name: Date of Service: [name], Cinda Quest. 11/06/2021 11:30 A M Medical Record Number: [id number] Patient Account Number: [id number] Date of Birth/Sex: Treating RN: Feb 03, 1934 (88 y.o. [place], [place], Gabriela Eves ([id number]) [id number] Page 5 of 8 Primary Care Provider: [name] Other Clinician: Referring Provider: Treating Provider/Extender: Ruthy Dick in Treatment: 13 Active Problems ICD-10 Encounter Code Description Active Date MDM Diagnosis I73.89 Other specified peripheral vascular diseases 08/01/2021 No Yes E11.621 Type 2 diabetes mellitus with foot ulcer 08/01/2021 No Yes L97.522 Non-pressure chronic ulcer of other part of left foot with fat layer exposed 08/01/2021 No Yes L97.822 Non-pressure chronic ulcer of other part of left  lower leg with fat layer exposed6/22/2023 No Yes L97.422 Non-pressure chronic ulcer of left heel and midfoot with fat layer exposed 08/01/2021 No Yes F01.50 Vascular dementia, unspecified severity, without behavioral disturbance, 08/01/2021 No Yes psychotic disturbance, mood disturbance, and anxiety Inactive Problems Resolved Problems Electronic Signature(s) Signed: 11/06/2021 1:40:43 PM By: Geralyn Corwin DO Entered By: Geralyn Corwin on 11/06/2021 12:47:23 -------------------------------------------------------------------------------- Progress Note Details Patient Name: Date of Service: [name], Cinda Quest. 11/06/2021 11:30 A M Medical Record Number: [id number] Patient Account Number: [id number] Date of Birth/Sex: Treating RN: [birth year] (88 y.o. [name] Primary Care Provider: [name] Other Clinician: Referring Provider:  Treating Provider/Extender: Ruthy Dick in Treatment: 13 Subjective Chief Complaint Information obtained from Patient Left LE Ulcers History of Present Illness (HPI) 08-01-2021 upon evaluation today patient appears to be doing better in regard to her wounds over the foot. This on the left foot as well as the lower extremity KIYOKO, MCGUIRT ([id number]) [id number] Page 6 of 8 medially was all due to a peripheral vascular issue and arterial insufficiency. Subsequently the patient did undergo intervention in order to clear this away as far as the blockage was concerned. Dr. Gilda Crease performed an arteriogram on 06-11-2021. She had a follow-up ABI/TBI performed on 07-11-2021. This showed that there was a post intervention ABI of zero 1.09 on the left with heel monophasic today toe pressure of 0.37 but nonetheless the patient seems to be doing much better and actually appears to have the ability to heal at this time overall extremely pleased with what I am seeing and there is some need for sharp debridement  today we will work on that at this point. Currently there is going to need to be some fairly aggressive debridement it does appear to be at this point. Patient has a history of diabetes mellitus type 2, peripheral vascular disease, and dementia. She is seen with her son who actually lives behind her and the patient actually lives alone but he takes good care of her and appears. 08-15-2021 upon evaluation today patient actually appears to be doing awesome in regard to her wounds. She has been tolerating the dressing changes without complication. Fortunately there does not appear to be any signs of active infection locally or systemically at this time. No fevers, chills, nausea, vomiting, or diarrhea. 08-29-2021 upon evaluation today patient appears to be doing well currently in regard to her wound. She has been tolerating the dressing changes without complication all 3 wounds are showing signs of significant improvement. Fortunately I do not see any evidence of active infection locally or systemically at this time. 09-12-2021 upon evaluation today patient appears to be doing well with regard to her wounds. There is some need for sharp debridement fortunately there does not appear to be any signs of active infection locally or systemically at this time. No fevers, chills, nausea, vomiting, or diarrhea. 09-26-2021 upon evaluation today patient appears to be doing better in regard to her wounds across the board everything is measuring smaller and looks to be doing excellent. I am very pleased with where we stand today. 10-10-2021 upon evaluation today patient appears to be doing well currently in regard to her wounds. In fact the leg ulcer medially as well as the heel ulcer laterally seem to be excellent in fact the lateral heel I think is healed completely. The lateral foot is the worst of the wounds but still seems to be doing decently well based on what I am seeing. Fortunately there is no signs of active  infection at this time. 9/27; patient presents for follow-up. She follows with our physician assistant and has not been seen in close to a month. She reports pain to the lateral aspect of the left foot. She states that the medial aspect has scabbed over and is dry with no drainage over the past week. She denies fever/chills, nausea/vomiting or increased warmth or erythema to the wound beds. Objective Constitutional Vitals Time Taken: 12:21 PM, Height: 64 in, Temperature: 97.8 F, Pulse: 67 bpm, Respiratory Rate: 16 breaths/min, Blood Pressure: 148/76 mmHg. General Notes: Left foot: T the lateral aspect there is  an open wound that probes to bone. She has tenderness on exam. No increased warmth, erythema or o purulent drainage currently. T the medial aspect there is a dried scab. o Integumentary (Hair, Skin) Wound #2 status is Open. Original cause of wound was Gradually Appeared. The date acquired was: 06/26/2021. The wound has been in treatment 13 weeks. The wound is located on the Left,Lateral Foot. The wound measures 0.8cm length x 0.8cm width x 0.3cm depth; 0.503cm^2 area and 0.151cm^3 volume. There is bone and Fat Layer (Subcutaneous Tissue) exposed. There is no tunneling or undermining noted. There is a medium amount of serous drainage noted. There is no granulation within the wound bed. There is a large (67-100%) amount of necrotic tissue within the wound bed including Adherent Slough. Wound #3 status is Open. Original cause of wound was Gradually Appeared. The date acquired was: 06/26/2021. The wound has been in treatment 13 weeks. The wound is located on the Left,Medial Lower Leg. The wound measures 1.5cm length x 1.3cm width x 0.1cm depth; 1.532cm^2 area and 0.153cm^3 volume. There is no tunneling or undermining noted. There is a medium amount of serosanguineous drainage noted. The wound margin is distinct with the outline attached to the wound base. There is no granulation within the wound  bed. There is a large (67-100%) amount of necrotic tissue within the wound bed including Eschar. Assessment Active Problems ICD-10 Other specified peripheral vascular diseases Type 2 diabetes mellitus with foot ulcer Non-pressure chronic ulcer of other part of left foot with fat layer exposed Non-pressure chronic ulcer of other part of left lower leg with fat layer exposed Non-pressure chronic ulcer of left heel and midfoot with fat layer exposed Vascular dementia, unspecified severity, without behavioral disturbance, psychotic disturbance, mood disturbance, and anxiety Patient's lateral wound has declined since last clinic visit. I can probe straight to bone. I debrided nonviable tissue. I obtained a bone culture and biopsy today. Not sure if there is enough specimen for the biopsy. If this is inconclusive she will need imaging. I will go ahead and place her on Augmentin and doxycycline. We will adjust based on bone culture results. She can use Dakin's wet-to-dry dressings to this area. For now I recommended keeping a foam border dressing to the medial left foot wound. Procedures Lorinda CreedRRISH, Samayah G ([id number]) [id number] Page 7 of 8 Wound #2 Pre-procedure diagnosis of Wound #2 is a Diabetic Wound/Ulcer of the Lower Extremity located on the Left,Lateral Foot .Severity of Tissue Pre Debridement is: Fat layer exposed. There was a Excisional Skin/Subcutaneous Tissue/Muscle/Bone Debridement with a total area of 0.64 sq cm performed by Geralyn CorwinHoffman, Harlis Champoux, MD. With the following instrument(s): Curette to remove Viable and Non-Viable tissue/material. Material removed includes Bone,Subcutaneous Tissue, and Slough. No specimens were taken. A time out was conducted at 12:34, prior to the start of the procedure. A Minimum amount of bleeding was controlled with Pressure. The procedure was tolerated well. Post Debridement Measurements: 0.8cm length x 0.8cm width x 0.3cm depth;  0.151cm^3 volume. Character of Wound/Ulcer Post Debridement is stable. Severity of Tissue Post Debridement is: Bone involvement without necrosis. Post procedure Diagnosis Wound #2: Same as Pre-Procedure Plan The following medication(s) was prescribed: Dakin's Solution miscellaneous 0.125 % solution 1 moisten gauze for wet to dry dressing starting 11/06/2021 doxycycline hyclate oral 100 mg tablet 1 1 tablet oral BID x 10 days starting 11/06/2021 amoxicillin-pot clavulanate oral 875 mg-125 mg tablet 1 1 tablet oral BID x 10 days starting 11/06/2021 1. In office sharp debridement 2.  Bone biopsy and bone culture 3. Augmentin and doxycycline 4. Dakin's wet-to-dry dressings 5. Foam border dressingoomedial left foot 6. Follow-up in 1 week Electronic Signature(s) Signed: 11/06/2021 1:40:43 PM By: Geralyn Corwin DO Entered By: Geralyn Corwin on 11/06/2021 13:10:55 -------------------------------------------------------------------------------- SuperBill Details Patient Name: Date of Service: [name], Cinda Quest 11/06/2021 Medical Record Number: [id number] Patient Account Number: [id number] Date of Birth/Sex: Treating RN: 30-Mar-1933 (88 y.o. [name], [name] Primary Care Provider: [name] Other Clinician: Referring Provider: Treating Provider/Extender: Ruthy Dick in Treatment: 13 Diagnosis Coding ICD-10 Codes Code Description I73.89 Other specified peripheral vascular diseases E11.621 Type 2 diabetes mellitus with foot ulcer L97.522 Non-pressure chronic ulcer of other part of left foot with fat layer exposed L97.822 Non-pressure chronic ulcer of other part of left lower leg with fat layer exposed L97.422 Non-pressure chronic ulcer of left heel and midfoot with fat layer exposed F01.50 Vascular dementia, unspecified severity, without behavioral disturbance, psychotic disturbance, mood disturbance, and anxiety Facility Procedures Physician Procedures : CPT4:  Description Modifier Code [id number] - WC PHYS LEVEL 4 - EST PT ICD-10 Diagnosis Description L97.522 Non-pressure chronic ulcer of other part of left foot with fat layer exposed E11.621 Type 2 diabetes mellitus with foot ulcer I73.89 Other  specified peripheral vascular diseases Quantity: 1 : CPT4: 6644034 Debridement; bone (includes epidermis, dermis, subQ tissue, muscle and/or fascia, if performed) 1st 20 sqcm or less ICD-10 Diagnosis Description L97.522 Non-pressure chronic ulcer of other part of left foot with fat layer exposed Quantity: 1 Electronic Signature(s) Signed: 11/06/2021 1:40:43 PM By: Geralyn Corwin DO Entered By: Geralyn Corwin on 11/06/2021 13:10:14

## 2021-11-06 NOTE — Progress Notes (Signed)
[name], [name] ([id number]) Visit Report for 11/06/2021 Arrival Information Details Patient Name: [name], [name]. Date of Service: 11/06/2021 11:30 AM Medical Record Number: [id number] Patient Account Number: [id number] Date of Birth/Sex: 1933-07-12 (87 y.o. F) Treating [name]: [name] Primary Care [name]: [name] Other Clinician: Referring [name]: [name] Treating [name]/Extender: [name] in Treatment: 13 Visit Information History Since Last Visit Added or deleted any medications: No Patient Arrived: Wheel Chair Has Dressing in Place as Prescribed: Yes Arrival Time: 12:16 Pain Present Now: No Transfer Assistance: Manual Patient Identification Verified: Yes Secondary Verification Process Completed: Yes Patient Has Alerts: Yes Patient Alerts: Patient on Blood Thinner ABI 07/11/21 R 1.11 TBI 0.3 ABI 07/11/21 L 1.09 TBI 0.3 Electronic Signature(s) Signed: 11/06/2021 5:32:28 PM By: [name], BSN, [name], CWS, [name], BSN Entered By: [name], BSN, [name], CWS, [name] on 11/06/2021 12:21:45 [name], [name] ([id number]) -------------------------------------------------------------------------------- Lower Extremity Assessment Details Patient Name: [name], [name]. Date of Service: 11/06/2021 11:30 AM Medical Record Number: [id number] Patient Account Number: [id number] Date of Birth/Sex: [birth year] (87 y.o. F) Treating [name]: [name] Primary Care [name]: [name] Other Clinician: Referring Bowie Delia: [name] Treating [name]/Extender: [name] in Treatment: 13 Edema Assessment Assessed: [Left: Yes] [Right: No] Edema: [Left: N] [Right: o] Vascular Assessment Pulses: Dorsalis Pedis Palpable: [Left:Yes] Electronic Signature(s) Signed: 11/06/2021 5:32:28 PM By: [name], BSN, [name], CWS, [name], BSN Entered By: [name], BSN, [name], CWS, [name] on 11/06/2021 12:30:46 [name], [name]  ([id number]) -------------------------------------------------------------------------------- Multi Wound Chart Details Patient Name: [name] Date of Service: 11/06/2021 11:30 AM Medical Record Number: [id number] Patient Account Number: [id number] Date of Birth/Sex: Sep 19, 1933 (87 y.o. 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 of the Lower N/A Extremity Extremity Comorbid History: Hypertension, Type II Diabetes, Hypertension, Type II Diabetes, N/A Osteoarthritis, Neuropathy Osteoarthritis, Neuropathy Date Acquired: 06/26/2021 06/26/2021 N/A Weeks of Treatment: 13 13 N/A Wound Status: Open Open N/A Wound Recurrence: No No N/A Measurements L x W x D (cm) 0.8x0.8x0.3 1.5x1.3x0.1 N/A Area (cm) : 0.503 1.532 N/A Volume (cm) : 0.151 0.153 N/A % Reduction in Area: 76.30% 61.00% N/A % Reduction in Volume: 28.80% 95.10% N/A Classification: Grade 2 Grade 2 N/A Exudate Amount: Medium Medium N/A Exudate Type: Serous Serosanguineous N/A Exudate Color: amber red, brown N/A Wound Margin: N/A Distinct, outline attached N/A Granulation Amount: None Present (0%) None Present (0%) N/A Necrotic Amount: Large (67-100%) Large (67-100%) N/A Necrotic Tissue: Adherent Slough Eschar N/A Exposed Structures: Fat Layer (Subcutaneous Tissue): Fascia: No N/A Yes Fat Layer (Subcutaneous Tissue): Fascia: No No Tendon: No Tendon: No Muscle: No Muscle: No Joint: No Joint: No Bone:  No Bone: No Epithelialization: None None N/A Treatment Notes Electronic Signature(s) Signed: 11/06/2021 5:32:28 PM By: [name], BSN, [name], CWS, [name], BSN Entered By: [name], BSN, [name], CWS, [name] on 11/06/2021 12:33:52 [name], [name] ([id number]) -------------------------------------------------------------------------------- [place] Details Patient Name: [name], [name]. Date of Service: 11/06/2021 11:30 AM Medical Record Number: [id number] Patient Account Number: [id number] Date of Birth/Sex: 22-Oct-1933 (87 y.o. F) Treating [name]: [name] Primary Care [name]: [name] Other Clinician: Referring [name]: [name] Treating [name]/Extender: [name],  [name] in Treatment: 13 Active Inactive Necrotic Tissue Nursing Diagnoses: Impaired tissue integrity related to necrotic/devitalized tissue Knowledge deficit related to management of necrotic/devitalized tissue Goals: Necrotic/devitalized tissue will be minimized in the wound bed Date Initiated: 11/06/2021 Target Resolution Date: 11/06/2021 Goal Status: Active Patient/caregiver will verbalize understanding of reason and process for debridement of necrotic tissue Date Initiated: 11/06/2021 Target Resolution Date: 11/06/2021 Goal Status: Active Interventions: Assess patient pain level pre-, during and post procedure and prior to discharge Provide education on necrotic tissue and debridement process Treatment Activities: Excisional debridement : 11/06/2021 Notes: Wound/Skin Impairment Nursing Diagnoses: Impaired tissue integrity Knowledge deficit related to ulceration/compromised skin integrity Goals: Ulcer/skin breakdown will have a volume reduction of 30% by week 4 Date Initiated: 08/01/2021 Target Resolution Date: 10/10/2021 Goal Status: Active Ulcer/skin breakdown will have a volume reduction of 50% by week 8 Date Initiated: 08/01/2021 Target Resolution Date: 10/12/2021 Goal Status:  Active Ulcer/skin breakdown will have a volume reduction of 80% by week 12 Date Initiated: 08/01/2021 Target Resolution Date: 10/24/2021 Goal Status: Active Ulcer/skin breakdown will heal within 14 weeks Date Initiated: 08/01/2021 Target Resolution Date: 11/07/2021 Goal Status: Active Interventions: Assess patient/caregiver ability to obtain necessary supplies Assess patient/caregiver ability to perform ulcer/skin care regimen upon admission and as needed Assess ulceration(s) every visit Provide education on ulcer and skin care Notes: Electronic Signature(s) [name], [name] ([id number]) Signed: 11/06/2021 5:32:28 PM By: [name], BSN, [name], CWS, [name], BSN Entered By: [name], BSN, [name], CWS, [name] on 11/06/2021 12:33:39 [name], [name] ([id number]) -------------------------------------------------------------------------------- Pain Assessment Details Patient Name: [name]. Date of Service: 11/06/2021 11:30 AM Medical Record Number: [id number] Patient Account Number: [id number] Date of Birth/Sex: Jan 16, 1934 (87 y.o. F) Treating [name]: [name] Primary Care Macil Crady: [name] Other Clinician: Referring Jakaden Ouzts: [name] Treating Diallo Ponder/Extender: [name] in Treatment: 13 Active Problems Location of Pain Severity and Description of Pain Patient Has Paino Yes Site Locations Pain Location: Generalized Pain Pain Management and Medication Current Pain Management: Notes little pain in the foot Electronic Signature(s) Signed: 11/06/2021 5:32:28 PM By: [name], BSN, [name], CWS, [name], BSN Entered By: [name], BSN, [name], CWS, [name] on 11/06/2021 12:22:23 Sheets, [name] ([id number]) -------------------------------------------------------------------------------- Wound Assessment Details Patient Name: Levins, [name]. Date of Service: 11/06/2021 11:30 AM Medical Record Number: [id number] Patient Account Number: [id number] Date of Birth/Sex: April 26, 1933 (87 y.o.  F) Treating [name]: [name] Primary Care Lonnetta Kniskern: [name] Other Clinician: Referring Perris Conwell: [name] Treating Vernis Cabacun[place]/Extender: [name] in Treatment: 13 Wound Status Wound Number: 2 Primary Etiology: Diabetic Wound/Ulcer of the Lower Extremity Wound Location: Left, Lateral Foot Wound Status: Open Wounding Event: Gradually Appeared Comorbid Hypertension, Type II Diabetes, Osteoarthritis, History: Neuropathy Date Acquired: 06/26/2021 Weeks Of Treatment: 13 Clustered Wound: No Photos Wound Measurements Length: (cm) 0.8 Width: (cm) 0.8 Depth: (cm) 0.3 Area: (cm) 0.503 Volume: (cm) 0.151 % Reduction in Area: 76.3% % Reduction in Volume: 28.8% Epithelialization: None Tunneling: No Undermining: No Wound Description Classification: Grade 2 Exudate Amount: Medium Exudate Type: Serous Exudate Color: amber Foul Odor After Cleansing: No Slough/Fibrino Yes Wound Bed Granulation Amount: None Present (0%) Exposed Structure Necrotic Amount: Large (67-100%) Fascia Exposed: No Necrotic Quality: Adherent Slough Fat Layer (Subcutaneous Tissue) Exposed: Yes Tendon Exposed: No Muscle Exposed: No Joint Exposed: No Bone Exposed: Yes Electronic Signature(s) Signed: 11/06/2021 5:32:28 PM By: [name], BSN, [name], CWS, [name], BSN Entered By: [name], BSN, [name], CWS, [name] on 11/06/2021 12:45:02 [place], [name] ([id number]) -------------------------------------------------------------------------------- Wound Assessment Details Patient Name:  [name], [name]. Date of Service: 11/06/2021 11:30 AM Medical Record Number: [id number] Patient Account Number: [id number] Date of Birth/Sex: 11-23-1933 (87 y.o. F) Treating [name]: [name] Primary Care Carly Sabo: [name] Other Clinician: Referring Aniruddh Ciavarella: [name] Treating Tyrrell Stephens/Extender: [name] in Treatment: 13 Wound Status Wound Number: 3 Primary Etiology: Diabetic Wound/Ulcer of the  Lower Extremity Wound Location: Left, Medial Lower Leg Wound Status: Open Wounding Event: Gradually Appeared Comorbid Hypertension, Type II Diabetes, Osteoarthritis, History: Neuropathy Date Acquired: 06/26/2021 Weeks Of Treatment: 13 Clustered Wound: No Photos Wound Measurements Length: (cm) 1.5 Width: (cm) 1.3 Depth: (cm) 0.1 Area: (cm) 1.532 Volume: (cm) 0.153 % Reduction in Area: 61% % Reduction in Volume: 95.1% Epithelialization: None Tunneling: No Undermining: No Wound Description Classification: Grade 2 Wound Margin: Distinct, outline attached Exudate Amount: Medium Exudate Type: Serosanguineous Exudate Color: red, brown Foul Odor After Cleansing: No Slough/Fibrino Yes Wound Bed Granulation Amount: None Present (0%) Exposed Structure Necrotic Amount: Large (67-100%) Fascia Exposed: No Necrotic Quality: Eschar Fat Layer (Subcutaneous Tissue) Exposed: No Tendon Exposed: No Muscle Exposed: No Joint Exposed: No Bone Exposed: No Electronic Signature(s) Signed: 11/06/2021 5:32:28 PM By: [name], BSN, [name], CWS, [name], BSN Entered By: [name], BSN, [name], CWS, [name] on 11/06/2021 12:30:13 [name], [name] ([id number]) -------------------------------------------------------------------------------- [place] Details Patient Name: [name] Date of Service: 11/06/2021 11:30 AM Medical Record Number: [id number] Patient Account Number: [id number] Date of Birth/Sex: [birth year] (87 y.o. F) Treating [name]: [name] Primary Care Marshell [name]: [name] Other Clinician: Referring Ralpheal [name]: [name] Treating Marietta Sikkema/Extender: [name] in Treatment: 13 Vital Signs Time Taken: 12:21 Temperature (F): 97.8 Height (in): 64 Pulse (bpm): 67 Respiratory Rate (breaths/min): 16 Blood Pressure (mmHg): 148/76 Reference Range: 80 - 120 mg / dl Electronic Signature(s) Signed: 11/06/2021 5:32:28 PM By: [name], BSN, [name], CWS, [name], BSN Entered By: [name], BSN, [name],  CWS, [name] on 11/06/2021 12:22:06

## 2021-11-08 LAB — SURGICAL PATHOLOGY

## 2021-11-11 ENCOUNTER — Encounter: Payer: Medicare HMO | Attending: Physician Assistant | Admitting: Physician Assistant

## 2021-11-11 DIAGNOSIS — E11622 Type 2 diabetes mellitus with other skin ulcer: Secondary | ICD-10-CM | POA: Insufficient documentation

## 2021-11-11 DIAGNOSIS — L97422 Non-pressure chronic ulcer of left heel and midfoot with fat layer exposed: Secondary | ICD-10-CM | POA: Insufficient documentation

## 2021-11-11 DIAGNOSIS — E1169 Type 2 diabetes mellitus with other specified complication: Secondary | ICD-10-CM | POA: Insufficient documentation

## 2021-11-11 DIAGNOSIS — L97522 Non-pressure chronic ulcer of other part of left foot with fat layer exposed: Secondary | ICD-10-CM | POA: Insufficient documentation

## 2021-11-11 DIAGNOSIS — E11621 Type 2 diabetes mellitus with foot ulcer: Secondary | ICD-10-CM | POA: Diagnosis not present

## 2021-11-11 DIAGNOSIS — E1151 Type 2 diabetes mellitus with diabetic peripheral angiopathy without gangrene: Secondary | ICD-10-CM | POA: Insufficient documentation

## 2021-11-11 DIAGNOSIS — L97822 Non-pressure chronic ulcer of other part of left lower leg with fat layer exposed: Secondary | ICD-10-CM | POA: Diagnosis not present

## 2021-11-11 DIAGNOSIS — F0150 Vascular dementia without behavioral disturbance: Secondary | ICD-10-CM | POA: Insufficient documentation

## 2021-11-11 DIAGNOSIS — I7389 Other specified peripheral vascular diseases: Secondary | ICD-10-CM | POA: Diagnosis not present

## 2021-11-11 DIAGNOSIS — M86672 Other chronic osteomyelitis, left ankle and foot: Secondary | ICD-10-CM | POA: Insufficient documentation

## 2021-11-11 DIAGNOSIS — L97524 Non-pressure chronic ulcer of other part of left foot with necrosis of bone: Secondary | ICD-10-CM | POA: Diagnosis not present

## 2021-11-11 NOTE — Progress Notes (Addendum)
[name], [name] ([id number]) Visit Report for 11/11/2021 Arrival Information Details Patient Name: [name], [name]. Date of Service: 11/11/2021 10:00 AM Medical Record Number: [id number] Patient Account Number: [id number] Date of Birth/Sex: Jun 19, 1933 (87 y.o. F) Treating RN: Cornell Barman Primary Care Ladd Cen: [name] Other Clinician: Referring Avriana Joo: [name] Treating Amberia Bayless/Extender: Skipper Cliche in Treatment: 7 Visit Information History Since Last Visit Added or deleted any medications: No Patient Arrived: Wheel Chair Has Dressing in Place as Prescribed: Yes Arrival Time: 10:23 Pain Present Now: No Accompanied By: son Transfer Assistance: Manual Patient Identification Verified: Yes Secondary Verification Process Completed: Yes Patient Has Alerts: Yes Patient Alerts: Patient on Blood Thinner ABI 07/11/21 R 1.11 TBI 0.3 ABI 07/11/21 L 1.09 TBI 0.3 Electronic Signature(s) Signed: 11/11/2021 1:10:05 PM By: Gretta Cool, BSN, RN, CWS, [name] RN, BSN Entered By: Gretta Cool, BSN, RN, CWS, [name] on 11/11/2021 10:24:10 [place], [name] ([id number]) -------------------------------------------------------------------------------- Encounter Discharge Information Details Patient Name: [name]. Date of Service: 11/11/2021 10:00 AM Medical Record Number: [id number] Patient Account Number: [id number] Date of Birth/Sex: [birth year] (87 y.o. F) Treating RN: Cornell Barman Primary Care Kyrstal Monterrosa: [name] Other Clinician: Referring Ruvi Fullenwider: [name] Treating Nikisha Fleece/Extender: Skipper Cliche in Treatment: 14 Encounter Discharge Information Items Post Procedure Vitals Discharge Condition: Stable Temperature (F): 97.8 Ambulatory Status: Wheelchair Pulse (bpm): 80 Discharge Destination: Home Respiratory Rate (breaths/min): 16 Transportation: Private Auto Blood Pressure (mmHg): 152/69 Accompanied By: son Schedule Follow-up Appointment: Yes Clinical Summary of  Care: Electronic Signature(s) Signed: 11/11/2021 1:10:05 PM By: Gretta Cool, BSN, RN, CWS, [name] RN, BSN Entered By: Gretta Cool, BSN, RN, CWS, [name] on 11/11/2021 10:59:10 Hay, [name] ([id number]) -------------------------------------------------------------------------------- Lower Extremity Assessment Details Patient Name: [name], [name]. Date of Service: 11/11/2021 10:00 AM Medical Record Number: [id number] Patient Account Number: [id number] Date of Birth/Sex: 07-28-1933 (87 y.o. F) Treating RN: Cornell Barman Primary Care Cydne Grahn: [name] Other Clinician: Referring Quention Mcneill: [name] Treating Manessa Buley/Extender: Skipper Cliche in Treatment: 14 Edema Assessment Assessed: [Left: Yes] Patrice Paradise: No] [Left: Edema] [Right: :] Vascular Assessment Pulses: Dorsalis Pedis Palpable: [Left:Yes] Notes Cap refill >3; warm to touch Electronic Signature(s) Signed: 11/11/2021 1:10:05 PM By: Gretta Cool, BSN, RN, CWS, [name] RN, BSN Entered By: Gretta Cool, BSN, RN, CWS, [name] on 11/11/2021 10:32:42 Mckenny, [name] ([id number]) -------------------------------------------------------------------------------- Multi Wound Chart Details Patient Name: [name] Date of Service: 11/11/2021 10:00 AM Medical Record Number: [id number] Patient Account Number: [id number] Date of Birth/Sex: 12-Jul-1933 (87 y.o. 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 N/A Extremity  Extremity Comorbid History: Hypertension, Type II Diabetes, Hypertension, Type II Diabetes, N/A Osteoarthritis, Neuropathy Osteoarthritis, Neuropathy Date Acquired: 06/26/2021 06/26/2021 N/A Weeks of Treatment: 14 14 N/A Wound Status: Open Healed - Epithelialized N/A Wound Recurrence: No No N/A Measurements L x W x D (cm) 0.9x1x0.4 0x0x0 N/A Area (cm) : 0.707 0 N/A Volume (cm) : 0.283 0 N/A % Reduction in Area: 66.70% 100.00% N/A % Reduction in Volume: -33.50% 100.00% N/A Classification: Grade 3 Grade 2 N/A Earleen Newport Verification: Culture N/A N/A Exudate Amount: Medium Medium N/A Exudate Type: Serous Serosanguineous N/A Exudate Color: amber red, brown N/A Wound Margin: Flat  and Intact Distinct, outline attached N/A Granulation Amount: Small (1-33%) None Present (0%) N/A Granulation Quality: Red N/A N/A Necrotic Amount: Large (67-100%) Large (67-100%) N/A Necrotic Tissue: Adherent Slough Eschar N/A Exposed Structures: Fat Layer (Subcutaneous Tissue): Fascia: No N/A Yes Fat Layer (Subcutaneous Tissue): Bone: Yes No Fascia: No Tendon: No Tendon: No Muscle: No Muscle: No Joint: No Joint: No Bone: No Epithelialization: None None N/A Treatment Notes Electronic Signature(s) Signed: 11/11/2021 1:10:05 PM By: Elliot Gurney, BSN, RN, CWS, [name] RN, BSN Entered By: Elliot Gurney, BSN, RN, CWS, [name] on 11/11/2021 10:45:45 [place], [name] ([id number]) Hynson, [name] ([id number]) -------------------------------------------------------------------------------- Multi-Disciplinary Care Plan Details Patient Name: [name], [name]. Date of Service: 11/11/2021 10:00 AM Medical Record Number: [id number] Patient Account Number: [id number] Date of Birth/Sex: [birth year] (87 y.o. F) Treating RN: Huel Coventry Primary Care Marithza Malachi: [name] Other Clinician: Referring Zephaniah Enyeart: [name] Treating Darshana Curnutt/Extender: Rowan Blase in Treatment: 14 Active Inactive Necrotic Tissue Nursing  Diagnoses: Impaired tissue integrity related to necrotic/devitalized tissue Knowledge deficit related to management of necrotic/devitalized tissue Goals: Necrotic/devitalized tissue will be minimized in the wound bed Date Initiated: 11/06/2021 Target Resolution Date: 11/06/2021 Goal Status: Active Patient/caregiver will verbalize understanding of reason and process for debridement of necrotic tissue Date Initiated: 11/06/2021 Target Resolution Date: 11/06/2021 Goal Status: Active Interventions: Assess patient pain level pre-, during and post procedure and prior to discharge Provide education on necrotic tissue and debridement process Treatment Activities: Excisional debridement : 11/06/2021 Notes: Wound/Skin Impairment Nursing Diagnoses: Impaired tissue integrity Knowledge deficit related to ulceration/compromised skin integrity Goals: Ulcer/skin breakdown will have a volume reduction of 30% by week 4 Date Initiated: 08/01/2021 Target Resolution Date: 10/10/2021 Goal Status: Active Ulcer/skin breakdown will have a volume reduction of 50% by week 8 Date Initiated: 08/01/2021 Target Resolution Date: 10/12/2021 Goal Status: Active Ulcer/skin breakdown will have a volume reduction of 80% by week 12 Date Initiated: 08/01/2021 Target Resolution Date: 10/24/2021 Goal Status: Active Ulcer/skin breakdown will heal within 14 weeks Date Initiated: 08/01/2021 Target Resolution Date: 11/07/2021 Goal Status: Active Interventions: Assess patient/caregiver ability to obtain necessary supplies Assess patient/caregiver ability to perform ulcer/skin care regimen upon admission and as needed Assess ulceration(s) every visit Provide education on ulcer and skin care Notes: Electronic Signature(s) ALYENE, ANGLE ([id number]) Signed: 11/11/2021 1:10:05 PM By: Elliot Gurney, BSN, RN, CWS, [name] RN, BSN Entered By: Elliot Gurney, BSN, RN, CWS, [name] on 11/11/2021 10:32:52 [name], [name]  ([id number]) -------------------------------------------------------------------------------- Pain Assessment Details Patient Name: [name]. Date of Service: 11/11/2021 10:00 AM Medical Record Number: [id number] Patient Account Number: [id number] Date of Birth/Sex: [birth year] (87 y.o. F) Treating RN: Huel Coventry Primary Care Manju Kulkarni: [name] Other Clinician: Referring Michaela Broski: [name] Treating Khamani Fairley/Extender: Rowan Blase in Treatment: 14 Active Problems Location of Pain Severity and Description of Pain Patient Has Paino No Site Locations Pain Management and Medication Current Pain Management: Notes Patient denies pain at this time. States it hurts in the morning when she gets up. Electronic Signature(s) Signed: 11/11/2021 1:10:05 PM By: Elliot Gurney, BSN, RN, CWS, [name] RN, BSN Entered By: Elliot Gurney, BSN, RN, CWS, [name] on 11/11/2021 10:25:00 [place], [name] ([id number]) -------------------------------------------------------------------------------- Patient/Caregiver Education Details Patient Name: [name] Date of Service: 11/11/2021 10:00 AM Medical Record Number: [id number] Patient Account Number: [id number] Date of Birth/Gender: 11/23/1933 (88 y.o. F) Treating RN: Huel Coventry Primary Care Physician: [name] Other Clinician: Referring Physician: [name] Treating Physician/Extender: Rowan Blase in Treatment: 14 Education Assessment Education Provided To: Caregiver Education Topics Provided Infection: Handouts:  Infection Prevention and Management, Other: take antibiotics as prescribed Methods: Demonstration, Explain/Verbal Responses: State content correctly Wound Debridement: Handouts: Wound Debridement Methods: Demonstration, Explain/Verbal Responses: State content correctly Electronic Signature(s) Signed: 11/11/2021 1:10:05 PM By: Gretta Cool, BSN, RN, CWS, [name] RN, BSN Entered By: Gretta Cool, BSN, RN, CWS, [name] on 11/11/2021  10:58:22 [place], [name] -------------------------------------------------------------------------------- Wound Assessment Details Patient Name: [name], [name]. Date of Service: 11/11/2021 10:00 AM Medical Record Number: [id number] Patient Account Number: [id number] Date of Birth/Sex: 29-Jul-1933 (87 y.o. F) Treating RN: Cornell Barman Primary Care Chandni Gagan: [name] Other Clinician: Referring Lilliona Blakeney: [name] Treating Marianna Cid/Extender: Skipper Cliche in Treatment: 14 Wound Status Wound Number: 2 Primary Etiology: Diabetic Wound/Ulcer of the Lower Extremity Wound Location: Left, Lateral Foot Wound Status: Open Wounding Event: Gradually Appeared Comorbid Hypertension, Type II Diabetes, Osteoarthritis, History: Neuropathy Date Acquired: 06/26/2021 Weeks Of Treatment: 14 Clustered Wound: No Photos Wound Measurements Length: (cm) 0.9 Width: (cm) 1 Depth: (cm) 0.4 Area: (cm) 0.707 Volume: (cm) 0.283 % Reduction in Area: 66.7% % Reduction in Volume: -33.5% Epithelialization: None Tunneling: No Undermining: No Wound Description Classification: Grade 3 Wagner Verification: Culture Wound Margin: Flat and Intact Exudate Amount: Medium Exudate Type: Serous Exudate Color: amber Foul Odor After Cleansing: No Slough/Fibrino Yes Wound Bed Granulation Amount: Small (1-33%) Exposed Structure Granulation Quality: Red Fascia Exposed: No Necrotic Amount: Large (67-100%) Fat Layer (Subcutaneous Tissue) Exposed: Yes Necrotic Quality: Adherent Slough Tendon Exposed: No Muscle Exposed: No Joint Exposed: No Bone Exposed: Yes Treatment Notes Wound #2 (Foot) Wound Laterality: Left, Lateral Cleanser Soap and Water Discharge Instruction: Gently cleanse wound with antibacterial soap, rinse and pat dry prior to dressing wounds Hollings, Jleigh G. ([id number]) Peri-Wound Care Topical Primary Dressing Prisma 4.34 (in) Discharge Instruction: Moisten w/normal  saline or sterile water; Cover wound as directed. Do not remove from wound bed. Secondary Dressing ABD Pad 5x9 (in/in) Discharge Instruction: Cover with ABD pad Secured With [place] H Soft Cloth Surgical Tape, 2x2 (in/yd) Kerlix Roll Sterile or Non-Sterile 6-ply 4.5x4 (yd/yd) Discharge Instruction: Apply Kerlix as directed Stretch Net Dressing, Latex-free, Size 5, Small-Head / Shoulder / Thigh Discharge Instruction: size 4 Compression Wrap Compression Stockings Add-Ons Electronic Signature(s) Signed: 11/11/2021 1:10:05 PM By: Gretta Cool, BSN, RN, CWS, [name] RN, BSN Entered By: Gretta Cool, BSN, RN, CWS, [name] on 11/11/2021 10:45:12 [place], [name] -------------------------------------------------------------------------------- Wound Assessment Details Patient Name: [name], [name]. Date of Service: 11/11/2021 10:00 AM Medical Record Number: [id number] Patient Account Number: [id number] Date of Birth/Sex: Oct 27, 1933 (87 y.o. F) Treating RN: Cornell Barman Primary Care Cleburn Maiolo: [name] Other Clinician: Referring Drea Jurewicz: [name] Treating Lael Pilch/Extender: Skipper Cliche in Treatment: 14 Wound Status Wound Number: 3 Primary Etiology: Diabetic Wound/Ulcer of the Lower Extremity Wound Location: Left, Medial Lower Leg Wound Status: Healed - Epithelialized Wounding Event: Gradually Appeared Comorbid Hypertension, Type II Diabetes, Osteoarthritis, History: Neuropathy Date Acquired: 06/26/2021 Weeks Of Treatment: 14 Clustered Wound: No Photos Wound Measurements Length: (cm) 0 % R Width: (cm) 0 % R Depth: (cm) 0 Epi Area: (cm) 0 Tu Volume: (cm) 0 Un eduction in Area: 100% eduction in Volume: 100% thelialization: None nneling: No dermining: No Wound Description Classification: Grade 2 Fo Wound Margin: Distinct, outline attached Sl Exudate Amount: Medium Exudate Type: Serosanguineous Exudate Color: red, brown ul Odor After Cleansing:  No ough/Fibrino Yes Wound Bed Granulation Amount: None Present (0%) Exposed Structure Necrotic Amount: Large (67-100%) Fascia Exposed: No Necrotic Quality: Eschar Fat Layer (Subcutaneous Tissue) Exposed: No Tendon Exposed: No  Muscle Exposed: No Joint Exposed: No Bone Exposed: No Treatment Notes Wound #3 (Lower Leg) Wound Laterality: Left, Medial Cleanser Peri-Wound Care Topical Zamorano, [name] ([id number]) Primary Dressing Secondary Dressing Secured With Compression Wrap Compression Stockings Add-Ons Electronic Signature(s) Signed: 11/11/2021 1:10:05 PM By: Gretta Cool, BSN, RN, CWS, [name] RN, BSN Entered By: Gretta Cool, BSN, RN, CWS, [name] on 11/11/2021 10:43:56 [place], [name] ([id number]) -------------------------------------------------------------------------------- [place] Details Patient Name: [name] Date of Service: 11/11/2021 10:00 AM Medical Record Number: [id number] Patient Account Number: [id number] Date of Birth/Sex: Nov 19, 1933 (87 y.o. F) Treating RN: Cornell Barman Primary Care Latroya Ng: [name] Other Clinician: Referring Stephaney Steven: [name] Treating Marianny Goris/Extender: Skipper Cliche in Treatment: 14 Vital Signs Time Taken: 10:24 Temperature (F): 97.8 Height (in): 64 Pulse (bpm): 80 Respiratory Rate (breaths/min): 16 Blood Pressure (mmHg): 152/69 Reference Range: 80 - 120 mg / dl Electronic Signature(s) Signed: 11/11/2021 1:10:05 PM By: Gretta Cool, BSN, RN, CWS, [name] RN, BSN Entered By: Gretta Cool, BSN, RN, CWS, [name] on 11/11/2021 10:24:31

## 2021-11-11 NOTE — Progress Notes (Addendum)
[name], [name] Visit Report for 11/11/2021 Chief Complaint Document Details Patient Name: [name], [name]. Date of Service: 11/11/2021 10:00 AM Medical Record Number: [id number] Patient Account Number: [id number] Date of Birth/Sex: November 24, 1933 (88 y.o. F) Treating RN: Cornell Barman Primary Care Provider: [name] Other Clinician: Referring Provider: [name] Treating Provider/Extender: Skipper Cliche in Treatment: 14 Information Obtained from: Patient Chief Complaint Left foot ulcer with osteomyelitis Electronic Signature(s) Signed: 11/11/2021 11:07:30 AM By: Worthy Keeler PA-C Previous Signature: 11/11/2021 10:23:23 AM Version By: Worthy Keeler PA-C Entered By: Worthy Keeler on 11/11/2021 11:07:30 [name], [name] -------------------------------------------------------------------------------- Debridement Details Patient Name: [name] Date of Service: 11/11/2021 10:00 AM Medical Record Number: [id number] Patient Account Number: [id number] Date of Birth/Sex: 23-May-1933 (88 y.o. F) Treating RN: Cornell Barman Primary Care Provider: [name] Other Clinician: Referring Provider: [name] Treating Provider/Extender: Skipper Cliche in Treatment: 14 Debridement Performed for Wound #2 Left,Lateral Foot Assessment: Performed By: Physician Tommie Sams., PA-C Debridement Type: Debridement Severity of Tissue Pre Debridement: Necrosis of bone Level of Consciousness (Pre- Awake and Alert procedure): Pre-procedure Verification/Time Out Yes - 10:43 Taken: Total Area Debrided (L x W): 0.9 (cm) x 1 (cm) = 0.9 (cm) Tissue and other material Viable, Non-Viable, Bone, Slough, Subcutaneous, Slough debrided: Level: Skin/Subcutaneous Tissue/Muscle/Bone Debridement Description: Excisional Instrument: Curette Bleeding: Minimum Hemostasis Achieved: Pressure Response to Treatment: Procedure was tolerated well Level of  Consciousness (Post- Awake and Alert procedure): Post Debridement Measurements of Total Wound Length: (cm) 0.9 Width: (cm) 1 Depth: (cm) 0.5 Volume: (cm) 0.353 Character of Wound/Ulcer Post Debridement: Stable Severity of Tissue Post Debridement: Necrosis of bone Post Procedure Diagnosis Same as Pre-procedure Electronic Signature(s) Signed: 11/11/2021 1:10:05 PM By: Gretta Cool, BSN, RN, CWS, [name] RN, BSN Signed: 11/11/2021 5:13:16 PM By: Worthy Keeler PA-C Entered By: Gretta Cool, BSN, RN, CWS, [name] on 11/11/2021 10:47:02 [name], [name] -------------------------------------------------------------------------------- HPI Details Patient Name: [name]. Date of Service: 11/11/2021 10:00 AM Medical Record Number: [id number] Patient Account Number: [id number] Date of Birth/Sex: September 06, 1933 (88 y.o. F) Treating RN: Cornell Barman Primary Care Provider: [name] Other Clinician: Referring Provider: [name] Treating Provider/Extender: Skipper Cliche in Treatment: 14 History of Present Illness HPI Description: 08-01-2021 upon evaluation today patient appears to be doing better in regard to her wounds over the foot. This on the left foot as well as the lower extremity medially was all due to a peripheral vascular issue and arterial insufficiency. Subsequently the patient did undergo intervention in order to clear this away as far as the blockage was concerned. Dr. Delana Meyer performed an arteriogram on 06-11-2021. She had a follow-up ABI/TBI performed on 07-11-2021. This showed that there was a post intervention ABI of zero 1.09 on the left with heel monophasic today toe pressure of 0.37 but nonetheless the patient seems to be doing much better and actually appears to have the ability to heal at this time overall extremely pleased with what I am seeing and there is some need for sharp debridement today we will work on that at this point. Currently there is going to need to be  some fairly aggressive debridement it does appear to be at this point. Patient has a history of diabetes mellitus type 2, peripheral vascular disease, and dementia. She is seen with her son who actually lives behind her and the patient actually lives alone but he takes good care of her and appears. 08-15-2021 upon evaluation today patient actually appears to  be doing awesome in regard to her wounds. She has been tolerating the dressing changes without complication. Fortunately there does not appear to be any signs of active infection locally or systemically at this time. No fevers, chills, nausea, vomiting, or diarrhea. 08-29-2021 upon evaluation today patient appears to be doing well currently in regard to her wound. She has been tolerating the dressing changes without complication all 3 wounds are showing signs of significant improvement. Fortunately I do not see any evidence of active infection locally or systemically at this time. 09-12-2021 upon evaluation today patient appears to be doing well with regard to her wounds. There is some need for sharp debridement fortunately there does not appear to be any signs of active infection locally or systemically at this time. No fevers, chills, nausea, vomiting, or diarrhea. 09-26-2021 upon evaluation today patient appears to be doing better in regard to her wounds across the board everything is measuring smaller and looks to be doing excellent. I am very pleased with where we stand today. 10-10-2021 upon evaluation today patient appears to be doing well currently in regard to her wounds. In fact the leg ulcer medially as well as the heel ulcer laterally seem to be excellent in fact the lateral heel I think is healed completely. The lateral foot is the worst of the wounds but still seems to be doing decently well based on what I am seeing. Fortunately there is no signs of active infection at this time. 9/27; patient presents for follow-up. She follows with our  physician assistant and has not been seen in close to a month. She reports pain to the lateral aspect of the left foot. She states that the medial aspect has scabbed over and is dry with no drainage over the past week. She denies fever/chills, nausea/vomiting or increased warmth or erythema to the wound beds. 11-11-2021 upon evaluation today patient appears to be doing well with regard to her leg ulcer although the lateral foot ulcer on the left is the area of concern. I did actually review the culture results as well as the pathology result from her last visit. The culture actually showed few Staphylococcus simulans as well as a few Staphylococcus epidermis. Subsequently I do believe that putting her on something like Levaquin would be a good option to switch to just 1 medication instead having her on Augmentin and doxycycline which I think is a little bit much for her long- term and since this is indeed showing to be osteomyelitis based on the pathology report that was also obtained at the last visit I think that we should probably switch her to [place] this is 1 time a day dose and it will make it much easier to actually treat the infection without her being on multiple antibiotics at once. Patient and her son are in agreement with this plan. Electronic Signature(s) Signed: 11/11/2021 11:04:16 AM By: Worthy Keeler PA-C Entered By: Worthy Keeler on 11/11/2021 11:04:15 [place], [name] -------------------------------------------------------------------------------- Physical Exam Details Patient Name: [name], [name]. Date of Service: 11/11/2021 10:00 AM Medical Record Number: [id number] Patient Account Number: [id number] Date of Birth/Sex: 1933/02/26 (87 y.o. F) Treating RN: Cornell Barman Primary Care Provider: [name] Other Clinician: Referring Provider: [name] Treating Provider/Extender: Skipper Cliche in Treatment: 27 Constitutional Well-nourished and  well-hydrated in no acute distress. Respiratory normal breathing without difficulty. Psychiatric this patient is able to make decisions and demonstrates good insight into disease process. Alert and Oriented x 3. pleasant and  cooperative. Notes Upon inspection patient's wound did require some sharp debridement of the left lateral foot in order to clearway some of the necrotic debris she tolerated this today without complication and postdebridement the wound bed appears to be doing significantly better which is great news. With that being said unfortunately necrotic bone was part of what was removed. I do believe that this is going to necessitate that we do something to keep this from drying out I think that using the silver collagen is probably can be the way to go in order to try to do this and a bordered foam over top. If this seems to be becoming too dry which is not to be good for the bone that we may have to switch to something different. Electronic Signature(s) Signed: 11/11/2021 11:05:15 AM By: Worthy Keeler PA-C Entered By: Worthy Keeler on 11/11/2021 11:05:15 [name], [name] ([id number]) -------------------------------------------------------------------------------- Physician Orders Details Patient Name: [name] Date of Service: 11/11/2021 10:00 AM Medical Record Number: [id number] Patient Account Number: [id number] Date of Birth/Sex: [birth year] (87 y.o. F) Treating RN: Cornell Barman Primary Care Provider: [name] Other Clinician: Referring Provider: [name] Treating Provider/Extender: Skipper Cliche in Treatment: 14 Verbal / Phone Orders: No Diagnosis Coding ICD-10 Coding Code Description I73.89 Other specified peripheral vascular diseases E11.621 Type 2 diabetes mellitus with foot ulcer L97.522 Non-pressure chronic ulcer of other part of left foot with fat layer exposed L97.822 Non-pressure chronic ulcer of other part of left lower leg with fat  layer exposed L97.422 Non-pressure chronic ulcer of left heel and midfoot with fat layer exposed Vascular dementia, unspecified severity, without behavioral disturbance, psychotic disturbance, mood disturbance, and F01.50 anxiety Follow-up Appointments o Return Appointment in 1 week. o Nurse Visit as needed [employer] o Wash wounds with antibacterial soap and water. - dial soap recommended o May shower; gently cleanse wound with antibacterial soap, rinse and pat dry prior to dressing wounds o No tub bath. Anesthetic (Use 'Patient Medications' Section for Anesthetic Order Entry) o Lidocaine applied to wound bed Edema Control - Lymphedema / Segmental Compressive Device / Other o Elevate, Exercise Daily and Avoid Standing for Long Periods of Time. o Elevate leg(s) parallel to the floor when sitting. o DO YOUR BEST to sleep in the bed at night. DO NOT sleep in your recliner. Long hours of sitting in a recliner leads to swelling of the legs and/or potential wounds on your backside. Off-Loading o Turn and reposition every 2 hours o Other: - keep heels off bed when sleeping, do best to keep pressure off heels Wound Treatment Wound #2 - Foot Wound Laterality: Left, Lateral Cleanser: Soap and Water 1 x Per Day/30 Days Discharge Instructions: Gently cleanse wound with antibacterial soap, rinse and pat dry prior to dressing wounds Primary Dressing: Prisma 4.34 (in) (Generic) 1 x Per Day/30 Days Discharge Instructions: Moisten w/normal saline or sterile water; Cover wound as directed. Do not remove from wound bed. Secondary Dressing: ABD Pad 5x9 (in/in) (Generic) 1 x Per Day/30 Days Discharge Instructions: Cover with ABD pad Secured With: Medipore Tape - 7M Medipore H Soft Cloth Surgical Tape, 2x2 (in/yd) (Generic) 1 x Per Day/30 Days Secured With: Kerlix Roll Sterile or Non-Sterile 6-ply 4.5x4 (yd/yd) (Generic) 1 x Per Day/30 Days Discharge Instructions: Apply  Kerlix as directed Secured With: Stretch Net Dressing, Latex-free, Size 5, Small-Head / Shoulder / Thigh 1 x Per Day/30 Days Discharge Instructions: size 4 Radiology Goettel, [name] ([id number]) o X-ray, foot -  Left foot 3-view Non-healing wound with bone exposed. Patient Medications Allergies: metformin Notifications Medication Indication Start End levofloxacin 11/11/2021 DOSE 1 - oral 750 mg tablet - 1 tablet oral taken 1 time per day for 30 days for a bone infection Electronic Signature(s) Signed: 11/11/2021 11:06:28 AM By: Worthy Keeler PA-C Entered By: Worthy Keeler on 11/11/2021 11:06:28 [name], [name] ([id number]) -------------------------------------------------------------------------------- Prescription 11/11/2021 Patient Name: [name]. Provider: Jeri Cos PA-C Date of Birth: 1933/07/29 NPI#: [id number] Sex: F DEA#: [id number] Phone #: [phone] License #: Patient Address: [facility] [place], [place] [address] [address] [place], [place] [address] [phone] Allergies metformin Provider's Orders o X-ray, foot - Left foot 3-view Non-healing wound with bone exposed. Hand Signature: Date(s): Electronic Signature(s) Signed: 11/11/2021 5:13:16 PM By: Worthy Keeler PA-C Entered By: Worthy Keeler on 11/11/2021 11:06:28 [name], [name] ([id number]) --------------------------------------------------------------------------------  Problem List Details Patient Name: [name] Date of Service: 11/11/2021 10:00 AM Medical Record Number: [id number] Patient Account Number: [id number] Date of Birth/Sex: 10/04/1933 (87 y.o. F) Treating RN: Cornell Barman Primary Care Provider: [name] Other Clinician: Referring Provider: [name] Treating Provider/Extender: Skipper Cliche in Treatment: 14 Active  Problems ICD-10 Encounter Code Description Active Date MDM Diagnosis [phone] Other chronic osteomyelitis, left ankle and foot 11/11/2021 No Yes I73.89 Other specified peripheral vascular diseases 08/01/2021 No Yes E11.621 Type 2 diabetes mellitus with foot ulcer 08/01/2021 No Yes L97.522 Non-pressure chronic ulcer of other part of left foot with fat layer 08/01/2021 No Yes exposed L97.822 Non-pressure chronic ulcer of other part of left lower leg with fat layer 08/01/2021 No Yes exposed L97.422 Non-pressure chronic ulcer of left heel and midfoot with fat layer 08/01/2021 No Yes exposed F01.50 Vascular dementia, unspecified severity, without behavioral disturbance, 08/01/2021 No Yes psychotic disturbance, mood disturbance, and anxiety Inactive Problems Resolved Problems Electronic Signature(s) Signed: 11/11/2021 11:07:09 AM By: Worthy Keeler PA-C Previous Signature: 11/11/2021 10:23:20 AM Version By: Worthy Keeler PA-C Entered By: Worthy Keeler on 11/11/2021 11:07:09 [place], [name] ([id number]) -------------------------------------------------------------------------------- Progress Note Details Patient Name: [name]. Date of Service: 11/11/2021 10:00 AM Medical Record Number: [id number] Patient Account Number: [id number] Date of Birth/Sex: [birth year] (87 y.o. F) Treating RN: Cornell Barman Primary Care Provider: [name] Other Clinician: Referring Provider: [name] Treating Provider/Extender: Skipper Cliche in Treatment: 14 Subjective Chief Complaint Information obtained from Patient Left foot ulcer with osteomyelitis History of Present Illness (HPI) 08-01-2021 upon evaluation today patient appears to be doing better in regard to her wounds over the foot. This on the left foot as well as the lower extremity medially was all due to a peripheral vascular issue and arterial insufficiency. Subsequently the patient did undergo intervention in order to clear this  away as far as the blockage was concerned. Dr. Delana Meyer performed an arteriogram on 06-11-2021. She had a follow-up ABI/TBI performed on 07-11-2021. This showed that there was a post intervention ABI of zero 1.09 on the left with heel monophasic today toe pressure of 0.37 but nonetheless the patient seems to be doing much better and actually appears to have the ability to heal at this time overall extremely pleased with what I am seeing and there is some need for sharp debridement today we will work on that at this point. Currently there is going to need to be some fairly aggressive debridement it does appear to be at  this point. Patient has a history of diabetes mellitus type 2, peripheral vascular disease, and dementia. She is seen with her son who actually lives behind her and the patient actually lives alone but he takes good care of her and appears. 08-15-2021 upon evaluation today patient actually appears to be doing awesome in regard to her wounds. She has been tolerating the dressing changes without complication. Fortunately there does not appear to be any signs of active infection locally or systemically at this time. No fevers, chills, nausea, vomiting, or diarrhea. 08-29-2021 upon evaluation today patient appears to be doing well currently in regard to her wound. She has been tolerating the dressing changes without complication all 3 wounds are showing signs of significant improvement. Fortunately I do not see any evidence of active infection locally or systemically at this time. 09-12-2021 upon evaluation today patient appears to be doing well with regard to her wounds. There is some need for sharp debridement fortunately there does not appear to be any signs of active infection locally or systemically at this time. No fevers, chills, nausea, vomiting, or diarrhea. 09-26-2021 upon evaluation today patient appears to be doing better in regard to her wounds across the board everything is measuring  smaller and looks to be doing excellent. I am very pleased with where we stand today. 10-10-2021 upon evaluation today patient appears to be doing well currently in regard to her wounds. In fact the leg ulcer medially as well as the heel ulcer laterally seem to be excellent in fact the lateral heel I think is healed completely. The lateral foot is the worst of the wounds but still seems to be doing decently well based on what I am seeing. Fortunately there is no signs of active infection at this time. 9/27; patient presents for follow-up. She follows with our physician assistant and has not been seen in close to a month. She reports pain to the lateral aspect of the left foot. She states that the medial aspect has scabbed over and is dry with no drainage over the past week. She denies fever/chills, nausea/vomiting or increased warmth or erythema to the wound beds. 11-11-2021 upon evaluation today patient appears to be doing well with regard to her leg ulcer although the lateral foot ulcer on the left is the area of concern. I did actually review the culture results as well as the pathology result from her last visit. The culture actually showed few Staphylococcus simulans as well as a few Staphylococcus epidermis. Subsequently I do believe that putting her on something like Levaquin would be a good option to switch to just 1 medication instead having her on Augmentin and doxycycline which I think is a little bit much for her long- term and since this is indeed showing to be osteomyelitis based on the pathology report that was also obtained at the last visit I think that we should probably switch her to [place] this is 1 time a day dose and it will make it much easier to actually treat the infection without her being on multiple antibiotics at once. Patient and her son are in agreement with this plan. Objective Constitutional Well-nourished and well-hydrated in no acute distress. Vitals Time Taken:  10:24 AM, Height: 64 in, Temperature: 97.8 F, Pulse: 80 bpm, Respiratory Rate: 16 breaths/min, Blood Pressure: 152/69 mmHg. [name], [name]. ([id number]) Respiratory normal breathing without difficulty. Psychiatric this patient is able to make decisions and demonstrates good insight into disease process. Alert and Oriented x 3. pleasant  and cooperative. General Notes: Upon inspection patient's wound did require some sharp debridement of the left lateral foot in order to clearway some of the necrotic debris she tolerated this today without complication and postdebridement the wound bed appears to be doing significantly better which is great news. With that being said unfortunately necrotic bone was part of what was removed. I do believe that this is going to necessitate that we do something to keep this from drying out I think that using the silver collagen is probably can be the way to go in order to try to do this and a bordered foam over top. If this seems to be becoming too dry which is not to be good for the bone that we may have to switch to something different. Integumentary (Hair, Skin) Wound #2 status is Open. Original cause of wound was Gradually Appeared. The date acquired was: 06/26/2021. The wound has been in treatment 14 weeks. The wound is located on the Left,Lateral Foot. The wound measures 0.9cm length x 1cm width x 0.4cm depth; 0.707cm^2 area and 0.283cm^3 volume. There is bone and Fat Layer (Subcutaneous Tissue) exposed. There is no tunneling or undermining noted. There is a medium amount of serous drainage noted. The wound margin is flat and intact. There is small (1-33%) red granulation within the wound bed. There is a large (67-100%) amount of necrotic tissue within the wound bed including Adherent Slough. Wound #3 status is Healed - Epithelialized. Original cause of wound was Gradually Appeared. The date acquired was: 06/26/2021. The wound has been in treatment 14 weeks.  The wound is located on the Left,Medial Lower Leg. The wound measures 0cm length x 0cm width x 0cm depth; 0cm^2 area and 0cm^3 volume. There is no tunneling or undermining noted. There is a medium amount of serosanguineous drainage noted. The wound margin is distinct with the outline attached to the wound base. There is no granulation within the wound bed. There is a large (67-100%) amount of necrotic tissue within the wound bed including Eschar. Assessment Active Problems ICD-10 Other chronic osteomyelitis, left ankle and foot Other specified peripheral vascular diseases Type 2 diabetes mellitus with foot ulcer Non-pressure chronic ulcer of other part of left foot with fat layer exposed Non-pressure chronic ulcer of other part of left lower leg with fat layer exposed Non-pressure chronic ulcer of left heel and midfoot with fat layer exposed Vascular dementia, unspecified severity, without behavioral disturbance, psychotic disturbance, mood disturbance, and anxiety Procedures Wound #2 Pre-procedure diagnosis of Wound #2 is a Diabetic Wound/Ulcer of the Lower Extremity located on the Left,Lateral Foot .Severity of Tissue Pre Debridement is: Necrosis of bone. There was a Excisional Skin/Subcutaneous Tissue/Muscle/Bone Debridement with a total area of 0.9 sq cm performed by Tommie Sams., PA-C. With the following instrument(s): Curette to remove Viable and Non-Viable tissue/material. Material removed includes Bone,Subcutaneous Tissue, and Slough. No specimens were taken. A time out was conducted at 10:43, prior to the start of the procedure. A Minimum amount of bleeding was controlled with Pressure. The procedure was tolerated well. Post Debridement Measurements: 0.9cm length x 1cm width x 0.5cm depth; 0.353cm^3 volume. Character of Wound/Ulcer Post Debridement is stable. Severity of Tissue Post Debridement is: Necrosis of bone. Post procedure Diagnosis Wound #2: Same as  Pre-Procedure Plan Follow-up Appointments: Return Appointment in 1 week. Nurse Visit as needed Bathing/ Shower/ Hygiene: Wash wounds with antibacterial soap and water. - dial soap recommended May shower; gently cleanse wound with antibacterial soap, rinse and pat  dry prior to dressing wounds No tub bath. [name], [name] ([id number]) Anesthetic (Use 'Patient Medications' Section for Anesthetic Order Entry): Lidocaine applied to wound bed Edema Control - Lymphedema / Segmental Compressive Device / Other: Elevate, Exercise Daily and Avoid Standing for Long Periods of Time. Elevate leg(s) parallel to the floor when sitting. DO YOUR BEST to sleep in the bed at night. DO NOT sleep in your recliner. Long hours of sitting in a recliner leads to swelling of the legs and/or potential wounds on your backside. Off-Loading: Turn and reposition every 2 hours Other: - keep heels off bed when sleeping, do best to keep pressure off heels Radiology ordered were: X-ray, foot - Left foot 3-view Non-healing wound with bone exposed. The following medication(s) was prescribed: levofloxacin oral 750 mg tablet 1 1 tablet oral taken 1 time per day for 30 days for a bone infection starting 11/11/2021 WOUND #2: - Foot Wound Laterality: Left, Lateral Cleanser: Soap and Water 1 x Per Day/30 Days Discharge Instructions: Gently cleanse wound with antibacterial soap, rinse and pat dry prior to dressing wounds Primary Dressing: Prisma 4.34 (in) (Generic) 1 x Per Day/30 Days Discharge Instructions: Moisten w/normal saline or sterile water; Cover wound as directed. Do not remove from wound bed. Secondary Dressing: ABD Pad 5x9 (in/in) (Generic) 1 x Per Day/30 Days Discharge Instructions: Cover with ABD pad Secured With: Medipore Tape - 22M Medipore H Soft Cloth Surgical Tape, 2x2 (in/yd) (Generic) 1 x Per Day/30 Days Secured With: Kerlix Roll Sterile or Non-Sterile 6-ply 4.5x4 (yd/yd) (Generic) 1 x Per Day/30  Days Discharge Instructions: Apply Kerlix as directed Secured With: Stretch Net Dressing, Latex-free, Size 5, Small-Head / Shoulder / Thigh 1 x Per Day/30 Days Discharge Instructions: size 4 1. Based on what I am seeing I am going to suggest currently that we actually go ahead and have the patient initiate treatment with Levaquin I Minna send this into the pharmacy for her and her son is in agreement with the plan as is the patient. This is going to be for the higher dose at 750 mg due to the fact that we are dealing with osteomyelitis and I want to make sure that we try to get this done as quickly as possible. 2. I am good recommend as well that we switch over to a silver collagen dressing I want to keep the bone from drying out as much as possible and I think that is can be of utmost importance at this point. I discussed that with the patient and her son today. 3. I am also sending the patient for an x-ray of the foot to see if we can identify the extent of the osteomyelitis although an MRI may end up being necessary in the end. We will see patient back for reevaluation in 1 week here in the clinic. If anything worsens or changes patient will contact our office for additional recommendations. Electronic Signature(s) Signed: 11/11/2021 11:08:38 AM By: Worthy Keeler PA-C Entered By: Worthy Keeler on 11/11/2021 11:08:38 [name], [name] ([id number]) -------------------------------------------------------------------------------- SuperBill Details Patient Name: [name] Date of Service: 11/11/2021 Medical Record Number: [id number] Patient Account Number: [id number] Date of Birth/Sex: [birth year] (88 y.o. 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 L97.822 Non-pressure chronic ulcer of other part of left lower leg with fat layer exposed L97.422 Non-pressure chronic ulcer of left heel and midfoot with fat layer exposed Vascular dementia, unspecified severity, without behavioral disturbance, psychotic disturbance, mood disturbance, and F01.50 anxiety Facility Procedures CPT4 Code: [id number] Description: 11044 - DEB BONE 20 SQ CM/< Modifier: Quantity: 1 CPT4 Code: Description: ICD-10 Diagnosis Description L97.522 Non-pressure chronic ulcer of other part of left foot with fat layer ex Modifier: posed Quantity: Physician Procedures CPT4: Description Modifier Quantity Code [id number] - WC PHYS LEVEL 4 - EST PT 25 1 CPT4: ICD-10 Diagnosis Description M86.672 Other chronic osteomyelitis, left ankle and foot I73.89 Other specified peripheral vascular diseases E11.621 Type 2 diabetes mellitus with foot ulcer L97.522 Non-pressure chronic ulcer of other part of left foot  with fat layer exposed CPT4: [id number] Debridement; bone (includes epidermis, dermis, subQ tissue, muscle and/or fascia, if performed) 1st 20 1 sqcm or less CPT4: ICD-10 Diagnosis Description L97.522 Non-pressure chronic ulcer of other part of left foot with fat layer exposed Electronic Signature(s) Signed: 11/11/2021 11:11:12 AM By: Worthy Keeler PA-C Entered By: Worthy Keeler on 11/11/2021 11:11:11

## 2021-11-12 LAB — AEROBIC CULTURE W GRAM STAIN (SUPERFICIAL SPECIMEN): Gram Stain: NONE SEEN

## 2021-11-13 ENCOUNTER — Other Ambulatory Visit: Payer: Self-pay | Admitting: Physician Assistant

## 2021-11-13 ENCOUNTER — Ambulatory Visit
Admission: RE | Admit: 2021-11-13 | Discharge: 2021-11-13 | Disposition: A | Discharge status: Home / Self Care | Payer: Medicare HMO | Source: Ambulatory Visit | Attending: Physician Assistant | Admitting: Physician Assistant

## 2021-11-13 DIAGNOSIS — S81802A Unspecified open wound, left lower leg, initial encounter: Secondary | ICD-10-CM | POA: Diagnosis not present

## 2021-11-13 DIAGNOSIS — M7989 Other specified soft tissue disorders: Secondary | ICD-10-CM | POA: Diagnosis not present

## 2021-11-18 ENCOUNTER — Encounter: Payer: Medicare HMO | Admitting: Physician Assistant

## 2021-11-18 DIAGNOSIS — F0150 Vascular dementia without behavioral disturbance: Secondary | ICD-10-CM | POA: Diagnosis not present

## 2021-11-18 DIAGNOSIS — E11622 Type 2 diabetes mellitus with other skin ulcer: Secondary | ICD-10-CM | POA: Diagnosis not present

## 2021-11-18 DIAGNOSIS — M86672 Other chronic osteomyelitis, left ankle and foot: Secondary | ICD-10-CM | POA: Diagnosis not present

## 2021-11-18 DIAGNOSIS — L97522 Non-pressure chronic ulcer of other part of left foot with fat layer exposed: Secondary | ICD-10-CM | POA: Diagnosis not present

## 2021-11-18 DIAGNOSIS — L97422 Non-pressure chronic ulcer of left heel and midfoot with fat layer exposed: Secondary | ICD-10-CM | POA: Diagnosis not present

## 2021-11-18 DIAGNOSIS — E1169 Type 2 diabetes mellitus with other specified complication: Secondary | ICD-10-CM | POA: Diagnosis not present

## 2021-11-18 DIAGNOSIS — L97524 Non-pressure chronic ulcer of other part of left foot with necrosis of bone: Secondary | ICD-10-CM | POA: Diagnosis not present

## 2021-11-18 DIAGNOSIS — E1151 Type 2 diabetes mellitus with diabetic peripheral angiopathy without gangrene: Secondary | ICD-10-CM | POA: Diagnosis not present

## 2021-11-18 DIAGNOSIS — E11621 Type 2 diabetes mellitus with foot ulcer: Secondary | ICD-10-CM | POA: Diagnosis not present

## 2021-11-18 DIAGNOSIS — L97822 Non-pressure chronic ulcer of other part of left lower leg with fat layer exposed: Secondary | ICD-10-CM | POA: Diagnosis not present

## 2021-11-18 NOTE — Progress Notes (Addendum)
[name] ([id number]) Visit Report for 11/18/2021 Arrival Information Details Patient Name: [name], [name]. Date of Service: 11/18/2021 3:30 PM Medical Record Number: [id number] Patient Account Number: [id number] Date of Birth/Sex: 09/30/1933 (88 y.o. F) Treating RN: Carlene Coria Primary Care Deborah Lazcano: [name] Other Clinician: Referring Elloise Roark: [name] Treating Bentley Haralson/Extender: Skipper Cliche in Treatment: 15 Visit Information History Since Last Visit All ordered tests and consults were completed: No Patient Arrived: Wheel Chair Added or deleted any medications: No Arrival Time: 15:31 Any new allergies or adverse reactions: No Accompanied By: son Had a fall or experienced change in No Transfer Assistance: None activities of daily living that may affect Patient Identification Verified: Yes risk of falls: Secondary Verification Process Completed: Yes Signs or symptoms of abuse/neglect since last visito No Patient Requires Transmission-Based No Hospitalized since last visit: No Precautions: Implantable device outside of the clinic excluding No Patient Has Alerts: Yes cellular tissue based products placed in the center Patient Alerts: Patient on Blood Thinner since last visit: ABI 07/11/21 R 1.11 TBI Has Dressing in Place as Prescribed: Yes 0.3 Pain Present Now: No ABI 07/11/21 L 1.09 TBI 0.3 Electronic Signature(s) Signed: 11/22/2021 12:25:01 PM By: Carlene Coria RN Entered By: Carlene Coria on 11/18/2021 15:35:55 Shin, Mervin [name] ([id number]) -------------------------------------------------------------------------------- Clinic Level of Care Assessment Details Patient Name: [name]. Date of Service: 11/18/2021 3:30 PM Medical Record Number: [id number] Patient Account Number: [id number] Date of Birth/Sex: Sep 04, 1933 (88 y.o. F) Treating RN: Carlene Coria Primary Care Abbrielle Batts: [name] Other Clinician: Referring Shanterria Franta:  [name] Treating Yehoshua Vitelli/Extender: Skipper Cliche in Treatment: 15 Clinic Level of Care Assessment Items TOOL 4 Quantity Score X - Use when only an EandM is performed on FOLLOW-UP visit 1 0 ASSESSMENTS - Nursing Assessment / Reassessment X - Reassessment of Co-morbidities (includes updates in patient status) 1 10 X- 1 5 Reassessment of Adherence to Treatment Plan ASSESSMENTS - Wound and Skin Assessment / Reassessment X - Simple Wound Assessment / Reassessment - one wound 1 5 []  - 0 Complex Wound Assessment / Reassessment - multiple wounds []  - 0 Dermatologic / Skin Assessment (not related to wound area) ASSESSMENTS - Focused Assessment []  - Circumferential Edema Measurements - multi extremities 0 []  - 0 Nutritional Assessment / Counseling / Intervention []  - 0 Lower Extremity Assessment (monofilament, tuning fork, pulses) []  - 0 Peripheral Arterial Disease Assessment (using hand held doppler) ASSESSMENTS - Ostomy and/or Continence Assessment and Care []  - Incontinence Assessment and Management 0 []  - 0 Ostomy Care Assessment and Management (repouching, etc.) PROCESS - Coordination of Care X - Simple Patient / Family Education for ongoing care 1 15 []  - 0 Complex (extensive) Patient / Family Education for ongoing care []  - 0 Staff obtains Programmer, systems, Records, Test Results / Process Orders []  - 0 Staff telephones HHA, Nursing Homes / Clarify orders / etc []  - 0 Routine Transfer to another Facility (non-emergent condition) []  - 0 Routine Hospital Admission (non-emergent condition) []  - 0 New Admissions / Biomedical engineer / Ordering NPWT, Apligraf, etc. []  - 0 Emergency Hospital Admission (emergent condition) X- 1 10 Simple Discharge Coordination []  - 0 Complex (extensive) Discharge Coordination PROCESS - Special Needs []  - Pediatric / Minor Patient Management 0 []  - 0 Isolation Patient Management []  - 0 Hearing / Language / Visual special needs []  -  0 Assessment of Community assistance (transportation, D/C planning, etc.) []  - 0 Additional assistance / Altered mentation []  - 0 Support Surface(s) Assessment (bed, cushion,  seat, etc.) INTERVENTIONS - Wound Cleansing / Measurement Klinger, Helyn G. ([id number]) X- 1 5 Simple Wound Cleansing - one wound []  - 0 Complex Wound Cleansing - multiple wounds X- 1 5 Wound Imaging (photographs - any number of wounds) []  - 0 Wound Tracing (instead of photographs) X- 1 5 Simple Wound Measurement - one wound []  - 0 Complex Wound Measurement - multiple wounds INTERVENTIONS - Wound Dressings X - Small Wound Dressing one or multiple wounds 1 10 []  - 0 Medium Wound Dressing one or multiple wounds []  - 0 Large Wound Dressing one or multiple wounds []  - 0 Application of Medications - topical []  - 0 Application of Medications - injection INTERVENTIONS - Miscellaneous []  - External ear exam 0 []  - 0 Specimen Collection (cultures, biopsies, blood, body fluids, etc.) []  - 0 Specimen(s) / Culture(s) sent or taken to Lab for analysis []  - 0 Patient Transfer (multiple staff / Civil Service fast streamer / Similar devices) []  - 0 Simple Staple / Suture removal (25 or less) []  - 0 Complex Staple / Suture removal (26 or more) []  - 0 Hypo / Hyperglycemic Management (close monitor of Blood Glucose) []  - 0 Ankle / Brachial Index (ABI) - do not check if billed separately X- 1 5 Vital Signs Has the patient been seen at the hospital within the last three years: Yes Total Score: 75 Level Of Care: New/Established - Level 2 Electronic Signature(s) Signed: 11/22/2021 12:25:01 PM By: Carlene Coria RN Entered By: Carlene Coria on 11/18/2021 16:04:15 [place], Mervin [name] ([id number]) -------------------------------------------------------------------------------- Encounter Discharge Information Details Patient Name: [name] Date of Service: 11/18/2021 3:30 PM Medical Record Number: [id number] Patient Account  Number: [id number] Date of Birth/Sex: 1933/11/16 (88 y.o. F) Treating RN: Carlene Coria Primary Care Patrecia Veiga: [name] Other Clinician: Referring Wallis Vancott: [name] Treating Darrek Leasure/Extender: Skipper Cliche in Treatment: 15 Encounter Discharge Information Items Discharge Condition: Stable Ambulatory Status: Wheelchair Discharge Destination: Home Transportation: Private Auto Accompanied By: son Schedule Follow-up Appointment: Yes Clinical Summary of Care: Electronic Signature(s) Signed: 11/22/2021 12:25:01 PM By: Carlene Coria RN Entered By: Carlene Coria on 11/18/2021 16:09:06 [place], Mervin [name] ([id number]) -------------------------------------------------------------------------------- Lower Extremity Assessment Details Patient Name: [place], Mervin [name]. Date of Service: 11/18/2021 3:30 PM Medical Record Number: [id number] Patient Account Number: [id number] Date of Birth/Sex: Nov 23, 1933 (88 y.o. F) Treating RN: Carlene Coria Primary Care Cesar Alf: [name] Other Clinician: Referring Jameison Haji: [name] Treating Kaveri Perras/Extender: Jeri Cos Weeks in Treatment: 15 Edema Assessment Assessed: [Left: No] [Right: No] Edema: [Left: Ye] [Right: s] Vascular Assessment Pulses: Dorsalis Pedis Palpable: [Left:Yes] Electronic Signature(s) Signed: 11/22/2021 12:25:01 PM By: Carlene Coria RN Entered By: Carlene Coria on 11/18/2021 15:40:57 [place], Mervin [name] ([id number]) -------------------------------------------------------------------------------- Multi Wound Chart Details Patient Name: [name]. Date of Service: 11/18/2021 3:30 PM Medical Record Number: [id number] Patient Account Number: [id number] Date of Birth/Sex: 1933-06-05 (88 y.o. F) Treating RN: Carlene Coria Primary Care Essica Kiker: [name] Other Clinician: Referring Olvin Rohr: [name] Treating Arienne Gartin/Extender: Skipper Cliche in Treatment: 15 Vital Signs Height(in):  23 Pulse(bpm): 48 Weight(lbs): Blood Pressure(mmHg): 174/72 Body Mass Index(BMI): Temperature(F): 97.9 Respiratory Rate(breaths/min): 18 Photos: [N/A:N/A] Wound Location: Left, Lateral Foot N/A N/A Wounding Event: Gradually Appeared N/A N/A Primary Etiology: Diabetic Wound/Ulcer of the Lower N/A N/A Extremity Comorbid History: Hypertension, Type II Diabetes, N/A N/A Osteoarthritis, Neuropathy Date Acquired: 06/26/2021 N/A N/A Weeks of Treatment: 15 N/A N/A Wound Status: Open N/A N/A Wound Recurrence: No N/A N/A Measurements L x W x D (cm) 1x1x0.5 N/A N/A Area (  cm) : 0.785 N/A N/A Volume (cm) : 0.393 N/A N/A % Reduction in Area: 63.00% N/A N/A % Reduction in Volume: -85.40% N/A N/A Classification: Grade 3 N/A N/A Exudate Amount: Medium N/A N/A Exudate Type: Serous N/A N/A Exudate Color: amber N/A N/A Wound Margin: Flat and Intact N/A N/A Granulation Amount: Medium (34-66%) N/A N/A Granulation Quality: Red N/A N/A Necrotic Amount: Medium (34-66%) N/A N/A Exposed Structures: Fascia: No N/A N/A Fat Layer (Subcutaneous Tissue): No Tendon: No Muscle: No Joint: No Bone: No Epithelialization: None N/A N/A Treatment Notes Electronic Signature(s) Signed: 11/22/2021 12:25:01 PM By: Carlene Coria RN Entered By: Carlene Coria on 11/18/2021 15:43:27 [place], Mervin [name] ([id number]) -------------------------------------------------------------------------------- [place] Details Patient Name: [name] Date of Service: 11/18/2021 3:30 PM Medical Record Number: [id number] Patient Account Number: [id number] Date of Birth/Sex: 07-08-1933 (88 y.o. F) Treating RN: Carlene Coria Primary Care Lugene Hitt: [name] Other Clinician: Referring Ariyana Faw: [name] Treating Esraa Seres/Extender: Skipper Cliche in Treatment: 15 Active Inactive Wound/Skin Impairment Nursing Diagnoses: Impaired tissue integrity Knowledge deficit related to  ulceration/compromised skin integrity Goals: Ulcer/skin breakdown will have a volume reduction of 30% by week 4 Date Initiated: 08/01/2021 Date Inactivated: 11/18/2021 Target Resolution Date: 10/10/2021 Goal Status: Unmet Unmet Reason: comordbitis Ulcer/skin breakdown will have a volume reduction of 50% by week 8 Date Initiated: 08/01/2021 Date Inactivated: 11/18/2021 Target Resolution Date: 10/12/2021 Goal Status: Unmet Unmet Reason: comordbitis Ulcer/skin breakdown will have a volume reduction of 80% by week 12 Date Initiated: 08/01/2021 Date Inactivated: 11/18/2021 Target Resolution Date: 10/24/2021 Goal Status: Unmet Unmet Reason: comordbities Ulcer/skin breakdown will heal within 14 weeks Date Initiated: 08/01/2021 Target Resolution Date: 12/07/2021 Goal Status: Active Interventions: Assess patient/caregiver ability to obtain necessary supplies Assess patient/caregiver ability to perform ulcer/skin care regimen upon admission and as needed Assess ulceration(s) every visit Provide education on ulcer and skin care Notes: Electronic Signature(s) Signed: 11/22/2021 12:25:01 PM By: Carlene Coria RN Entered By: Carlene Coria on 11/18/2021 15:43:11 [place], Mervin [name] ([id number]) -------------------------------------------------------------------------------- Pain Assessment Details Patient Name: [name] Date of Service: 11/18/2021 3:30 PM Medical Record Number: [id number] Patient Account Number: [id number] Date of Birth/Sex: [birth year] (88 y.o. F) Treating RN: Carlene Coria Primary Care Cloma Rahrig: [name] Other Clinician: Referring Johnavon Mcclafferty: [name] Treating Kenishia Plack/Extender: Skipper Cliche in Treatment: 15 Active Problems Location of Pain Severity and Description of Pain Patient Has Paino No Site Locations Pain Management and Medication Current Pain Management: Electronic Signature(s) Signed: 11/22/2021 12:25:01 PM By: Carlene Coria RN Entered By: Carlene Coria on 11/18/2021 15:36:31 [place], Mervin [name] ([id number]) -------------------------------------------------------------------------------- Patient/Caregiver Education Details Patient Name: [name] Date of Service: 11/18/2021 3:30 PM Medical Record Number: [id number] Patient Account Number: [id number] Date of Birth/Gender: January 18, 1934 (88 y.o. F) Treating RN: Carlene Coria Primary Care Physician: [name] Other Clinician: Referring Physician: [name] Treating Physician/Extender: Skipper Cliche in Treatment: 15 Education Assessment Education Provided To: Patient Education Topics Provided Wound/Skin Impairment: Methods: Explain/Verbal Responses: State content correctly Electronic Signature(s) Signed: 11/22/2021 12:25:01 PM By: Carlene Coria RN Entered By: Carlene Coria on 11/18/2021 16:04:33 [place], Mervin [name] ([id number]) -------------------------------------------------------------------------------- Wound Assessment Details Patient Name: [place], Mervin [name]. Date of Service: 11/18/2021 3:30 PM Medical Record Number: [id number] Patient Account Number: [id number] Date of Birth/Sex: 05/02/1933 (88 y.o. F) Treating RN: Carlene Coria Primary Care Bhavesh Vazquez: [name] Other Clinician: Referring Anhelica Fowers: [name] Treating Verla Bryngelson/Extender: Skipper Cliche in Treatment: 15 Wound Status Wound Number: 2 Primary Etiology: Diabetic Wound/Ulcer of the Lower Extremity Wound Location: Left,  Lateral Foot Wound Status: Open Wounding Event: Gradually Appeared Comorbid Hypertension, Type II Diabetes, Osteoarthritis, History: Neuropathy Date Acquired: 06/26/2021 Weeks Of Treatment: 15 Clustered Wound: No Photos Wound Measurements Length: (cm) 1 Width: (cm) 1 Depth: (cm) 0.5 Area: (cm) 0.785 Volume: (cm) 0.393 % Reduction in Area: 63% % Reduction in Volume: -85.4% Epithelialization: None Tunneling: No Undermining: No Wound  Description Classification: Grade 3 Wound Margin: Flat and Intact Exudate Amount: Medium Exudate Type: Serous Exudate Color: amber Foul Odor After Cleansing: No Slough/Fibrino Yes Wound Bed Granulation Amount: Medium (34-66%) Exposed Structure Granulation Quality: Red Fascia Exposed: No Necrotic Amount: Medium (34-66%) Fat Layer (Subcutaneous Tissue) Exposed: No Necrotic Quality: Adherent Slough Tendon Exposed: No Muscle Exposed: No Joint Exposed: No Bone Exposed: No Treatment Notes Wound #2 (Foot) Wound Laterality: Left, Lateral Cleanser Soap and Water Discharge Instruction: Gently cleanse wound with antibacterial soap, rinse and pat dry prior to dressing wounds Peri-Wound Care [place], Wanette G. ([id number]) Topical Primary Dressing Prisma 4.34 (in) Discharge Instruction: Moisten w/normal saline or sterile water; Cover wound as directed. Do not remove from wound bed. Secondary Dressing ABD Pad 5x9 (in/in) Discharge Instruction: Cover with ABD pad Secured With [place] H Soft Cloth Surgical Tape, 2x2 (in/yd) Kerlix Roll Sterile or Non-Sterile 6-ply 4.5x4 (yd/yd) Discharge Instruction: Apply Kerlix as directed Stretch Net Dressing, Latex-free, Size 5, Small-Head / Shoulder / Thigh Discharge Instruction: size 4 Compression Wrap Compression Stockings Add-Ons Electronic Signature(s) Signed: 11/22/2021 12:25:01 PM By: Carlene Coria RN Entered By: Carlene Coria on 11/18/2021 15:40:26 [place]le, Mervin [name] ([id number]) -------------------------------------------------------------------------------- Vitals Details Patient Name: [name] Date of Service: 11/18/2021 3:30 PM Medical Record Number: [id number] Patient Account Number: [id number] Date of Birth/Sex: 1933/11/18 (88 y.o. F) Treating RN: Carlene Coria Primary Care Daliah Chaudoin: [name] Other Clinician: Referring Greycen Felter: [name] Treating Cason Dabney/Extender: Skipper Cliche in  Treatment: 15 Vital Signs Time Taken: 15:36 Temperature (F): 97.9 Height (in): 64 Pulse (bpm): 69 Respiratory Rate (breaths/min): 18 Blood Pressure (mmHg): 174/72 Reference Range: 80 - 120 mg / dl Electronic Signature(s) Signed: 11/22/2021 12:25:01 PM By: Carlene Coria RN Entered By: Carlene Coria on 11/18/2021 15:36:21

## 2021-11-18 NOTE — Progress Notes (Addendum)
[name] CreedRRISH, Elisabeth G. ([id number]) Visit Report for 11/18/2021 Chief Complaint Document Details Patient Name: [name] CreedRRISH, Loie G. Date of Service: 11/18/2021 3:30 PM Medical Record Number: [id number] Patient Account Number: [id number] Date of Birth/Sex: May 05, 1933 (88 y.o. F) Treating RN: Yevonne PaxEpps, Carrie Primary Care Provider: Marcelino DusterJohnston, John Other Clinician: Referring Provider: Marcelino DusterJohnston, John Treating Provider/Extender: Rowan BlaseStone, Kelechi Astarita Weeks in Treatment: 15 Information Obtained from: Patient Chief Complaint Left foot ulcer with osteomyelitis Electronic Signature(s) Signed: 11/18/2021 3:32:27 PM By: Lenda KelpStone III, Lenn Volker PA-C Entered By: Lenda KelpStone III, Mana Haberl on 11/18/2021 15:32:27 Bucher, Gabriela EvesHALLIE G. ([id number]) -------------------------------------------------------------------------------- Debridement Details Patient Name: [name] CreedPARRISH, Michaella G. Date of Service: 11/18/2021 3:30 PM Medical Record Number: [id number] Patient Account Number: [id number] Date of Birth/Sex: May 05, 1933 (88 y.o. F) Treating RN: Yevonne PaxEpps, Carrie Primary Care Provider: Marcelino DusterJohnston, John Other Clinician: Referring Provider: Marcelino DusterJohnston, John Treating Provider/Extender: Rowan BlaseStone, Amesha Bailey Weeks in Treatment: 15 Debridement Performed for Wound #2 Left,Lateral Foot Assessment: Performed By: Physician Nelida MeuseStone, Arielle Eber E., PA-C Debridement Type: Debridement Severity of Tissue Pre Debridement: Necrosis of bone Level of Consciousness (Pre- Awake and Alert procedure): Pre-procedure Verification/Time Out Yes - 16:01 Taken: Start Time: 16:01 Total Area Debrided (L x W): 1 (cm) x 1 (cm) = 1 (cm) Tissue and other material Viable, Non-Viable, Callus, Slough, Subcutaneous, Slough debrided: Level: Skin/Subcutaneous Tissue Debridement Description: Excisional Instrument: Curette Bleeding: Minimum Hemostasis Achieved: Pressure End Time: 16:03 Response to Treatment: Procedure was tolerated well Level of Consciousness (Post- Awake and Alert procedure): Post  Debridement Measurements of Total Wound Length: (cm) 1 Width: (cm) 1 Depth: (cm) 0.5 Volume: (cm) 0.393 Character of Wound/Ulcer Post Debridement: Improved Severity of Tissue Post Debridement: Necrosis of bone Post Procedure Diagnosis Same as Pre-procedure Electronic Signature(s) Signed: 11/18/2021 5:04:50 PM By: Lenda KelpStone III, Nariah Morgano PA-C Signed: 11/22/2021 12:25:01 PM By: Yevonne PaxEpps, Carrie RN Entered By: Lenda KelpStone III, Keilon Ressel on 11/18/2021 17:04:50 Dejaynes, Gabriela EvesHALLIE G. ([id number]) -------------------------------------------------------------------------------- HPI Details Patient Name: [name] CreedPARRISH, Ivyanna G. Date of Service: 11/18/2021 3:30 PM Medical Record Number: [id number] Patient Account Number: [id number] Date of Birth/Sex: May 05, 1933 (88 y.o. F) Treating RN: Yevonne PaxEpps, Carrie Primary Care Provider: Marcelino DusterJohnston, John Other Clinician: Referring Provider: Marcelino DusterJohnston, John Treating Provider/Extender: Rowan BlaseStone, Cassidi Modesitt Weeks in Treatment: 15 History of Present Illness HPI Description: 08-01-2021 upon evaluation today patient appears to be doing better in regard to her wounds over the foot. This on the left foot as well as the lower extremity medially was all due to a peripheral vascular issue and arterial insufficiency. Subsequently the patient did undergo intervention in order to clear this away as far as the blockage was concerned. Dr. Gilda CreaseSchnier performed an arteriogram on 06-11-2021. She had a follow-up ABI/TBI performed on 07-11-2021. This showed that there was a post intervention ABI of zero 1.09 on the left with heel monophasic today toe pressure of 0.37 but nonetheless the patient seems to be doing much better and actually appears to have the ability to heal at this time overall extremely pleased with what I am seeing and there is some need for sharp debridement today we will work on that at this point. Currently there is going to need to be some fairly aggressive debridement it does appear to be at this point. Patient  has a history of diabetes mellitus type 2, peripheral vascular disease, and dementia. She is seen with her son who actually lives behind her and the patient actually lives alone but he takes good care of her and appears. 08-15-2021 upon evaluation today patient actually appears to be doing awesome in regard to her wounds. She has been  tolerating the dressing changes without complication. Fortunately there does not appear to be any signs of active infection locally or systemically at this time. No fevers, chills, nausea, vomiting, or diarrhea. 08-29-2021 upon evaluation today patient appears to be doing well currently in regard to her wound. She has been tolerating the dressing changes without complication all 3 wounds are showing signs of significant improvement. Fortunately I do not see any evidence of active infection locally or systemically at this time. 09-12-2021 upon evaluation today patient appears to be doing well with regard to her wounds. There is some need for sharp debridement fortunately there does not appear to be any signs of active infection locally or systemically at this time. No fevers, chills, nausea, vomiting, or diarrhea. 09-26-2021 upon evaluation today patient appears to be doing better in regard to her wounds across the board everything is measuring smaller and looks to be doing excellent. I am very pleased with where we stand today. 10-10-2021 upon evaluation today patient appears to be doing well currently in regard to her wounds. In fact the leg ulcer medially as well as the heel ulcer laterally seem to be excellent in fact the lateral heel I think is healed completely. The lateral foot is the worst of the wounds but still seems to be doing decently well based on what I am seeing. Fortunately there is no signs of active infection at this time. 9/27; patient presents for follow-up. She follows with our physician assistant and has not been seen in close to a month. She reports pain  to the lateral aspect of the left foot. She states that the medial aspect has scabbed over and is dry with no drainage over the past week. She denies fever/chills, nausea/vomiting or increased warmth or erythema to the wound beds. 11-11-2021 upon evaluation today patient appears to be doing well with regard to her leg ulcer although the lateral foot ulcer on the left is the area of concern. I did actually review the culture results as well as the pathology result from her last visit. The culture actually showed few Staphylococcus simulans as well as a few Staphylococcus epidermis. Subsequently I do believe that putting her on something like Levaquin would be a good option to switch to just 1 medication instead having her on Augmentin and doxycycline which I think is a little bit much for her long- term and since this is indeed showing to be osteomyelitis based on the pathology report that was also obtained at the last visit I think that we should probably switch her to Levaquin this is 1 time a day dose and it will make it much easier to actually treat the infection without her being on multiple antibiotics at once. Patient and her son are in agreement with this plan. 11-18-2021 upon evaluation today patient appears to be doing well with regard to her wound we did get the x-ray it showed that there was some cortical breakdown on the lateral aspect of the foot right where we know the wound is other than this there did not appear to be any signs of active infection throughout the remainder of the bone that was identifiable on x-ray. I did speak with the radiologist and they recommended that if we were concerned about it further osteo spread throughout the foot that we should proceed with MRI or CT scan. Because the patient does have a BB in the periorbital area of her right I will can actually go over the CT scan. Electronic Signature(s)  Signed: 11/18/2021 4:58:19 PM By: Lenda Kelp PA-C Entered By:  Lenda Kelp on 11/18/2021 16:58:19 Renner, Gabriela Eves ([id number]) -------------------------------------------------------------------------------- Physical Exam Details Patient Name: Stansbery, Gabriela Eves. Date of Service: 11/18/2021 3:30 PM Medical Record Number: [id number] Patient Account Number: [id number] Date of Birth/Sex: [birth year] (88 y.o. F) Treating RN: Yevonne Pax Primary Care Provider: [name] Other Clinician: Referring Provider: [name] Treating Provider/Extender: Rowan Blase in Treatment: 15 Constitutional Well-nourished and well-hydrated in no acute distress. Respiratory normal breathing without difficulty. Psychiatric this patient is able to make decisions and demonstrates good insight into disease process. Alert and Oriented x 3. pleasant and cooperative. Notes Fortunately it does not appear to be extremely necrotic which is good news that actually seems quite healthy. I did remove Upon inspection patient's wound bed actually showed signs again of some good granulation there is still bone noted in the base of the wound just slough and biofilm down to subcutaneous tissue and bone although I did not have to remove any bone today. Electronic Signature(s) Signed: 11/18/2021 4:58:42 PM By: Lenda Kelp PA-C Entered By: Lenda Kelp on 11/18/2021 16:58:41 Cure, Gabriela Eves ([id number]) -------------------------------------------------------------------------------- Physician Orders Details Patient Name: [name] Date of Service: 11/18/2021 3:30 PM Medical Record Number: [id number] Patient Account Number: [id number] Date of Birth/Sex: 08/05/1933 (88 y.o. F) Treating RN: Yevonne Pax Primary Care Provider: [name] Other Clinician: Referring Provider: [name] Treating Provider/Extender: Rowan Blase in Treatment: 15 Verbal / Phone Orders: No Diagnosis Coding ICD-10 Coding Code Description [id number] Other chronic  osteomyelitis, left ankle and foot I73.89 Other specified peripheral vascular diseases E11.621 Type 2 diabetes mellitus with foot ulcer L97.522 Non-pressure chronic ulcer of other part of left foot with fat layer exposed L97.822 Non-pressure chronic ulcer of other part of left lower leg with fat layer exposed L97.422 Non-pressure chronic ulcer of left heel and midfoot with fat layer exposed Vascular dementia, unspecified severity, without behavioral disturbance, psychotic disturbance, mood disturbance, and F01.50 anxiety Follow-up Appointments o Return Appointment in 1 week. o Nurse Visit as needed [employer] o Wash wounds with antibacterial soap and water. - dial soap recommended o May shower; gently cleanse wound with antibacterial soap, rinse and pat dry prior to dressing wounds o No tub bath. Anesthetic (Use 'Patient Medications' Section for Anesthetic Order Entry) o Lidocaine applied to wound bed Edema Control - Lymphedema / Segmental Compressive Device / Other o Elevate, Exercise Daily and Avoid Standing for Long Periods of Time. o Elevate leg(s) parallel to the floor when sitting. o DO YOUR BEST to sleep in the bed at night. DO NOT sleep in your recliner. Long hours of sitting in a recliner leads to swelling of the legs and/or potential wounds on your backside. Off-Loading o Turn and reposition every 2 hours o Other: - keep heels off bed when sleeping, do best to keep pressure off heels Wound Treatment Wound #2 - Foot Wound Laterality: Left, Lateral Cleanser: Soap and Water 1 x Per Day/30 Days Discharge Instructions: Gently cleanse wound with antibacterial soap, rinse and pat dry prior to dressing wounds Primary Dressing: Prisma 4.34 (in) (Generic) 1 x Per Day/30 Days Discharge Instructions: Moisten w/normal saline or sterile water; Cover wound as directed. Do not remove from wound bed. Secondary Dressing: ABD Pad 5x9 (in/in) (Generic) 1 x  Per Day/30 Days Discharge Instructions: Cover with ABD pad Secured With: Medipore Tape - 39M Medipore H Soft Cloth Surgical Tape, 2x2 (in/yd) (Generic) 1 x Per Day/30  Days Secured With: [employer] or Non-Sterile 6-ply 4.5x4 (yd/yd) (Generic) 1 x Per Day/30 Days Discharge Instructions: Apply Kerlix as directed Secured With: Stretch Net Dressing, Latex-free, Size 5, Small-Head / Shoulder / Thigh 1 x Per Day/30 Days Discharge Instructions: size 4 Krull, Emorie G. 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 Date of Service: 11/18/2021 3:30 PM Medical Record Number: [id number] Patient Account Number: [id number] Date of Birth/Sex: January 29, 1934 (88 y.o. F) Treating RN: Yevonne Pax Primary Care Provider: [name] Other Clinician: Referring Provider: [name] Treating Provider/Extender: Rowan Blase in Treatment: 15 Active Problems ICD-10 Encounter Code Description Active Date MDM Diagnosis [phone] Other chronic osteomyelitis, left ankle and foot 11/11/2021 No Yes I73.89 Other specified peripheral vascular diseases 08/01/2021 No Yes E11.621 Type 2 diabetes mellitus with foot ulcer 08/01/2021 No Yes L97.522 Non-pressure chronic ulcer of other part of left foot with fat layer 08/01/2021 No Yes exposed L97.822 Non-pressure chronic ulcer of other part of left lower leg with fat layer 08/01/2021 No Yes exposed L97.422 Non-pressure chronic  ulcer of left heel and midfoot with fat layer 08/01/2021 No Yes exposed F01.50 Vascular dementia, unspecified severity, without behavioral disturbance, 08/01/2021 No Yes psychotic disturbance, mood disturbance, and anxiety Inactive Problems Resolved Problems Electronic Signature(s) Signed: 11/18/2021 3:32:23 PM By: Lenda Kelp PA-C Entered By: Lenda Kelp on 11/18/2021 15:32:23 Schollmeyer, Gabriela Eves ([id number]) -------------------------------------------------------------------------------- Progress Note Details Patient Name: [name] Date of Service: 11/18/2021 3:30 PM Medical Record Number: [id number] Patient Account Number: [id number] Date of Birth/Sex: 20-Nov-1933 (88 y.o. F) Treating RN: Yevonne Pax Primary Care Provider: [name] Other Clinician: Referring Provider: [name] Treating Provider/Extender: Rowan Blase in Treatment: 15 Subjective Chief Complaint Information obtained from Patient Left foot ulcer with osteomyelitis History of Present Illness (HPI) 08-01-2021 upon evaluation today patient appears to be doing better in regard to her wounds over the foot. This on the left foot as well as the lower extremity medially was all due to a peripheral vascular issue and arterial insufficiency. Subsequently the patient did undergo intervention in order to clear this away as far as the blockage was concerned. Dr. Gilda Crease performed an arteriogram on 06-11-2021. She had a follow-up ABI/TBI performed on 07-11-2021. This showed that there was a post intervention ABI of zero 1.09 on the left with heel monophasic today toe pressure of 0.37 but nonetheless the patient seems to be doing much better and actually appears to have the ability to heal at this time overall extremely pleased with what I am seeing and there is some need for sharp debridement today we will work on that at this point. Currently there is going to need to be some fairly aggressive debridement it  does appear to be at this point. Patient has a history of diabetes mellitus type 2, peripheral vascular disease, and dementia. She is seen with her son who actually lives behind her and the patient actually lives alone but he takes good care of her and appears. 08-15-2021 upon evaluation today patient actually appears to be doing awesome in regard to  her wounds. She has been tolerating the dressing changes without complication. Fortunately there does not appear to be any signs of active infection locally or systemically at this time. No fevers, chills, nausea, vomiting, or diarrhea. 08-29-2021 upon evaluation today patient appears to be doing well currently in regard to her wound. She has been tolerating the dressing changes without complication all 3 wounds are showing signs of significant improvement. Fortunately I do not see any evidence of active infection locally or systemically at this time. 09-12-2021 upon evaluation today patient appears to be doing well with regard to her wounds. There is some need for sharp debridement fortunately there does not appear to be any signs of active infection locally or systemically at this time. No fevers, chills, nausea, vomiting, or diarrhea. 09-26-2021 upon evaluation today patient appears to be doing better in regard to her wounds across the board everything is measuring smaller and looks to be doing excellent. I am very pleased with where we stand today. 10-10-2021 upon evaluation today patient appears to be doing well currently in regard to her wounds. In fact the leg ulcer medially as well as the heel ulcer laterally seem to be excellent in fact the lateral heel I think is healed completely. The lateral foot is the worst of the wounds but still seems to be doing decently well based on what I am seeing. Fortunately there is no signs of active infection at this time. 9/27; patient presents for follow-up. She follows with our physician assistant and has not been  seen in close to a month. She reports pain to the lateral aspect of the left foot. She states that the medial aspect has scabbed over and is dry with no drainage over the past week. She denies fever/chills, nausea/vomiting or increased warmth or erythema to the wound beds. 11-11-2021 upon evaluation today patient appears to be doing well with regard to her leg ulcer although the lateral foot ulcer on the left is the area of concern. I did actually review the culture results as well as the pathology result from her last visit. The culture actually showed few Staphylococcus simulans as well as a few Staphylococcus epidermis. Subsequently I do believe that putting her on something like Levaquin would be a good option to switch to just 1 medication instead having her on Augmentin and doxycycline which I think is a little bit much for her long- term and since this is indeed showing to be osteomyelitis based on the pathology report that was also obtained at the last visit I think that we should probably switch her to Levaquin this is 1 time a day dose and it will make it much easier to actually treat the infection without her being on multiple antibiotics at once. Patient and her son are in agreement with this plan. 11-18-2021 upon evaluation today patient appears to be doing well with regard to her wound we did get the x-ray it showed that there was some cortical breakdown on the lateral aspect of the foot right where we know the wound is other than this there did not appear to be any signs of active infection throughout the remainder of the bone that was identifiable on x-ray. I did speak with the radiologist and they recommended that if we were concerned about it further osteo spread throughout the foot that we should proceed with MRI or CT scan. Because the patient does have a BB in the periorbital area of her right I will can actually go over  the CT scan. Objective Constitutional Missildine, Jerusha G.  ([id number]) Well-nourished and well-hydrated in no acute distress. Vitals Time Taken: 3:36 PM, Height: 64 in, Temperature: 97.9 F, Pulse: 69 bpm, Respiratory Rate: 18 breaths/min, Blood Pressure: 174/72 mmHg. Respiratory normal breathing without difficulty. Psychiatric this patient is able to make decisions and demonstrates good insight into disease process. Alert and Oriented x 3. pleasant and cooperative. General Notes: Fortunately it does not appear to be extremely necrotic which is good news that actually seems quite healthy. I did remove Upon inspection patient's wound bed actually showed signs again of some good granulation there is still bone noted in the base of the wound just slough and biofilm down to subcutaneous tissue and bone although I did not have to remove any bone today. Integumentary (Hair, Skin) Wound #2 status is Open. Original cause of wound was Gradually Appeared. The date acquired was: 06/26/2021. The wound has been in treatment 15 weeks. The wound is located on the Left,Lateral Foot. The wound measures 1cm length x 1cm width x 0.5cm depth; 0.785cm^2 area and 0.393cm^3 volume. There is no tunneling or undermining noted. There is a medium amount of serous drainage noted. The wound margin is flat and intact. There is medium (34-66%) red granulation within the wound bed. There is a medium (34-66%) amount of necrotic tissue within the wound bed including Adherent Slough. Assessment Active Problems ICD-10 Other chronic osteomyelitis, left ankle and foot Other specified peripheral vascular diseases Type 2 diabetes mellitus with foot ulcer Non-pressure chronic ulcer of other part of left foot with fat layer exposed Non-pressure chronic ulcer of other part of left lower leg with fat layer exposed Non-pressure chronic ulcer of left heel and midfoot with fat layer exposed Vascular dementia, unspecified severity, without behavioral disturbance, psychotic disturbance, mood  disturbance, and anxiety Procedures Wound #2 Pre-procedure diagnosis of Wound #2 is a Diabetic Wound/Ulcer of the Lower Extremity located on the Left,Lateral Foot .Severity of Tissue Pre Debridement is: Necrosis of bone. There was a Excisional Skin/Subcutaneous Tissue Debridement with a total area of 1 sq cm performed by Tommie Sams., PA-C. With the following instrument(s): Curette to remove Viable and Non-Viable tissue/material. Material removed includes Callus, Subcutaneous Tissue, and Slough. A time out was conducted at 16:01, prior to the start of the procedure. A Minimum amount of bleeding was controlled with Pressure. The procedure was tolerated well. Post Debridement Measurements: 1cm length x 1cm width x 0.5cm depth; 0.393cm^3 volume. Character of Wound/Ulcer Post Debridement is improved. Severity of Tissue Post Debridement is: Necrosis of bone. Post procedure Diagnosis Wound #2: Same as Pre-Procedure Plan Follow-up Appointments: Return Appointment in 1 week. Nurse Visit as needed Bathing/ Shower/ Hygiene: Wash wounds with antibacterial soap and water. - dial soap recommended May shower; gently cleanse wound with antibacterial soap, rinse and pat dry prior to dressing wounds No tub bath. Anesthetic (Use 'Patient Medications' Section for Anesthetic Order Entry): Lidocaine applied to wound bed Edema Control - Lymphedema / Segmental Compressive Device / Other: Elevate, Exercise Daily and Avoid Standing for Long Periods of Time. Knutzen, Theresa G. ([id number]) Elevate leg(s) parallel to the floor when sitting. DO YOUR BEST to sleep in the bed at night. DO NOT sleep in your recliner. Long hours of sitting in a recliner leads to swelling of the legs and/or potential wounds on your backside. Off-Loading: Turn and reposition every 2 hours Other: - keep heels off bed when sleeping, do best to keep pressure off heels Radiology ordered were: Computed Tomography (  CT) Scan ,left Lower  extremity with contrast - CT due to patient has metal BB in her right eye WOUND #2: - Foot Wound Laterality: Left, Lateral Cleanser: Soap and Water 1 x Per Day/30 Days Discharge Instructions: Gently cleanse wound with antibacterial soap, rinse and pat dry prior to dressing wounds Primary Dressing: Prisma 4.34 (in) (Generic) 1 x Per Day/30 Days Discharge Instructions: Moisten w/normal saline or sterile water; Cover wound as directed. Do not remove from wound bed. Secondary Dressing: ABD Pad 5x9 (in/in) (Generic) 1 x Per Day/30 Days Discharge Instructions: Cover with ABD pad Secured With: Medipore Tape - 13M Medipore H Soft Cloth Surgical Tape, 2x2 (in/yd) (Generic) 1 x Per Day/30 Days Secured With: Kerlix Roll Sterile or Non-Sterile 6-ply 4.5x4 (yd/yd) (Generic) 1 x Per Day/30 Days Discharge Instructions: Apply Kerlix as directed Secured With: Stretch Net Dressing, Latex-free, Size 5, Small-Head / Shoulder / Thigh 1 x Per Day/30 Days Discharge Instructions: size 4 1. I am good recommend currently that we have the patient continue with the silver collagen I think that still good to be the best way to go. 2. I am also going to suggest that we have the patient continue with the ABD pad to cover roll gauze to secure in place. 3. I am also going to suggest that the patient should continue with the stretching that her son does a great job taking care of this. We will see patient back for reevaluation in 2 weeks here in the clinic. If anything worsens or changes patient will contact our office for additional recommendations. Electronic Signature(s) Signed: 11/18/2021 5:16:25 PM By: Lenda Kelp PA-C Previous Signature: 11/18/2021 4:59:08 PM Version By: Lenda Kelp PA-C Entered By: Lenda Kelp on 11/18/2021 17:16:25 Vanduyne, Gabriela Eves ([id number]) -------------------------------------------------------------------------------- SuperBill Details Patient Name: [name] Date of  Service: 11/18/2021 Medical Record Number: [id number] Patient Account Number: [id number] Date of Birth/Sex: 13-Jun-1933 (88 y.o. F) Treating RN: Yevonne Pax Primary Care Provider: [name] Other Clinician: Referring Provider: [name] Treating Provider/Extender: Rowan Blase in Treatment: 15 Diagnosis Coding ICD-10 Codes Code Description [phone] Other chronic osteomyelitis, left ankle and foot I73.89 Other specified peripheral vascular diseases E11.621 Type 2 diabetes mellitus with foot ulcer L97.522 Non-pressure chronic ulcer of other part of left foot with fat layer exposed L97.822 Non-pressure chronic ulcer of other part of left lower leg with fat layer exposed L97.422 Non-pressure chronic ulcer of left heel and midfoot with fat layer exposed Vascular dementia, unspecified severity, without behavioral disturbance, psychotic disturbance, mood disturbance, and F01.50 anxiety Facility Procedures CPT4 Code: [id number] Description: 11042 - DEB SUBQ TISSUE 20 SQ CM/< Modifier: Quantity: 1 CPT4 Code: Description: ICD-10 Diagnosis Description L97.522 Non-pressure chronic ulcer of other part of left foot with fat layer exp Modifier: osed Quantity: Physician Procedures CPT4 Code: [id number] Description: 11042 - WC PHYS SUBQ TISS 20 SQ CM Modifier: Quantity: 1 CPT4 Code: Description: ICD-10 Diagnosis Description L97.522 Non-pressure chronic ulcer of other part of left foot with fat layer exp Modifier: osed Quantity: Electronic Signature(s) Signed: 11/18/2021 5:16:50 PM By: Lenda Kelp PA-C Entered By: Lenda Kelp on 11/18/2021 17:16:50

## 2021-11-19 ENCOUNTER — Other Ambulatory Visit: Payer: Self-pay | Admitting: Physician Assistant

## 2021-11-19 DIAGNOSIS — M86672 Other chronic osteomyelitis, left ankle and foot: Secondary | ICD-10-CM

## 2021-11-29 ENCOUNTER — Ambulatory Visit
Admission: RE | Admit: 2021-11-29 | Discharge: 2021-11-29 | Disposition: A | Discharge status: Home / Self Care | Payer: Medicare HMO | Source: Ambulatory Visit | Attending: Physician Assistant | Admitting: Physician Assistant

## 2021-11-29 DIAGNOSIS — M86672 Other chronic osteomyelitis, left ankle and foot: Secondary | ICD-10-CM | POA: Diagnosis not present

## 2021-11-29 DIAGNOSIS — R600 Localized edema: Secondary | ICD-10-CM | POA: Diagnosis not present

## 2021-11-29 MED ORDER — IOHEXOL 300 MG/ML  SOLN
75.0000 mL | Freq: Once | INTRAMUSCULAR | Status: AC | PRN
  Administered 2021-11-29: 75 mL via INTRAVENOUS

## 2021-12-02 ENCOUNTER — Encounter: Payer: Medicare HMO | Admitting: Physician Assistant

## 2021-12-02 DIAGNOSIS — E1151 Type 2 diabetes mellitus with diabetic peripheral angiopathy without gangrene: Secondary | ICD-10-CM | POA: Diagnosis not present

## 2021-12-02 DIAGNOSIS — E1169 Type 2 diabetes mellitus with other specified complication: Secondary | ICD-10-CM | POA: Diagnosis not present

## 2021-12-02 DIAGNOSIS — E11621 Type 2 diabetes mellitus with foot ulcer: Secondary | ICD-10-CM | POA: Diagnosis not present

## 2021-12-02 DIAGNOSIS — E11622 Type 2 diabetes mellitus with other skin ulcer: Secondary | ICD-10-CM | POA: Diagnosis not present

## 2021-12-02 DIAGNOSIS — F0150 Vascular dementia without behavioral disturbance: Secondary | ICD-10-CM | POA: Diagnosis not present

## 2021-12-02 DIAGNOSIS — M86672 Other chronic osteomyelitis, left ankle and foot: Secondary | ICD-10-CM | POA: Diagnosis not present

## 2021-12-02 DIAGNOSIS — L97522 Non-pressure chronic ulcer of other part of left foot with fat layer exposed: Secondary | ICD-10-CM | POA: Diagnosis not present

## 2021-12-02 DIAGNOSIS — L97822 Non-pressure chronic ulcer of other part of left lower leg with fat layer exposed: Secondary | ICD-10-CM | POA: Diagnosis not present

## 2021-12-02 DIAGNOSIS — L97422 Non-pressure chronic ulcer of left heel and midfoot with fat layer exposed: Secondary | ICD-10-CM | POA: Diagnosis not present

## 2021-12-02 NOTE — Progress Notes (Addendum)
[name], [name] ([id number]) [id number] Page 1 of 9 Visit Report for 12/02/2021 Chief Complaint Document Details Patient Name: Date of Service: PA [name] 12/02/2021 2:15 PM Medical Record Number: [id number] Patient Account Number: [id number] Date of Birth/Sex: Treating RN: 1933/06/01 (88 y.o. [name], [name] Primary Care Provider: [name] Referring Provider: Treating Provider/Extender: [name] in Treatment: 17 Information Obtained from: Patient Chief Complaint Left foot ulcer with osteomyelitis Electronic Signature(s) Signed: 12/02/2021 2:45:44 PM By: Worthy Keeler PA-C Entered By: Worthy Keeler on 12/02/2021 14:45:44 -------------------------------------------------------------------------------- Debridement Details Patient Name: Date of Service: PA [name], [name] Kennyth Lose 12/02/2021 2:15 PM Medical Record Number: [id number] Patient Account Number: [id number] Date of Birth/Sex: Treating RN: 09-15-1933 (88 y.o. [name], [name] Primary Care Provider: [name] Referring Provider: Treating Provider/Extender: [name] in Treatment: 17 Debridement Performed for Assessment: Wound #2 Left,Lateral Foot Performed By: Physician Tommie Sams., PA-C Debridement Type: Debridement Severity of Tissue Pre Debridement: Fat layer exposed Level of Consciousness (Pre-procedure): Awake and Alert Pre-procedure Verification/Time Out Yes - 15:22 Taken: Start Time: 15:22 T Area Debrided (L x W): otal 1 (cm) x 1 (cm) = 1 (cm) Tissue and other material debrided: Viable, Non-Viable, Callus, Slough, Subcutaneous, Biofilm, Slough Level: Skin/Subcutaneous Tissue Debridement Description: Excisional Instrument: Curette Bleeding: Minimum Hemostasis Achieved: Pressure End Time: 15:24 Response to Treatment: Procedure was tolerated well Level  of Consciousness (Post- Awake and Alert procedure): [name] ([id number]) [id number] Page 2 of 9 Post Debridement Measurements of Total Wound Length: (cm) 0.8 Width: (cm) 0.9 Depth: (cm) 0.6 Volume: (cm) 0.339 Character of Wound/Ulcer Post Debridement: Stable Severity of Tissue Post Debridement: Fat layer exposed Post Procedure Diagnosis Same as Pre-procedure Electronic Signature(s) Signed: 12/02/2021 5:38:46 PM By: [name] Signed: 12/02/2021 6:00:27 PM By: Gretta Cool, BSN, RN, CWS, [name] RN, BSN Signed: 12/03/2021 6:28:37 PM By: Worthy Keeler PA-C Entered By: [name] on 12/02/2021 15:26:12 -------------------------------------------------------------------------------- HPI Details Patient Name: Date of Service: PA [name], [name]. 12/02/2021 2:15 PM Medical Record Number: [id number] Patient Account Number: [id number] Date of Birth/Sex: Treating RN: [birth year] (88 y.o. [name] Primary Care Provider: [name] Referring Provider: Treating Provider/Extender: [name] in Treatment: 17 History of Present Illness HPI Description: 08-01-2021 upon evaluation today patient appears to be doing better in regard to her wounds over the foot. This on the left foot as well as the lower extremity medially was all due to a peripheral vascular issue and arterial insufficiency. Subsequently the patient did undergo intervention in order to clear this away as far as the blockage was concerned. Dr. Delana Meyer performed an arteriogram on 06-11-2021. She had a follow-up ABI/TBI performed on 07-11-2021. This showed that there was a post intervention ABI of zero 1.09 on the left with heel monophasic today toe pressure of 0.37 but nonetheless the patient seems to be doing much better and actually appears to have the ability to heal at this time overall extremely pleased with what I am seeing and there  is some need for sharp debridement today we will work on that at this point. Currently there is going to need to be some fairly aggressive debridement it does appear to be at this point. Patient has a history of diabetes mellitus type 2, peripheral vascular disease, and dementia. She is seen with her son who actually lives behind her and the patient actually lives  alone but he takes good care of her and appears. 08-15-2021 upon evaluation today patient actually appears to be doing awesome in regard to her wounds. She has been tolerating the dressing changes without complication. Fortunately there does not appear to be any signs of active infection locally or systemically at this time. No fevers, chills, nausea, vomiting, or diarrhea. 08-29-2021 upon evaluation today patient appears to be doing well currently in regard to her wound. She has been tolerating the dressing changes without complication all 3 wounds are showing signs of significant improvement. Fortunately I do not see any evidence of active infection locally or systemically at this time. 09-12-2021 upon evaluation today patient appears to be doing well with regard to her wounds. There is some need for sharp debridement fortunately there does not appear to be any signs of active infection locally or systemically at this time. No fevers, chills, nausea, vomiting, or diarrhea. 09-26-2021 upon evaluation today patient appears to be doing better in regard to her wounds across the board everything is measuring smaller and looks to be doing excellent. I am very pleased with where we stand today. 10-10-2021 upon evaluation today patient appears to be doing well currently in regard to her wounds. In fact the leg ulcer medially as well as the heel ulcer laterally seem to be excellent in fact the lateral heel I think is healed completely. The lateral foot is the worst of the wounds but still seems to be doing decently well based on what I am seeing.  Fortunately there is no signs of active infection at this time. 9/27; patient presents for follow-up. She follows with our physician assistant and has not been seen in close to a month. She reports pain to the lateral aspect of the left foot. She states that the medial aspect has scabbed over and is dry with no drainage over the past week. She denies fever/chills, nausea/vomiting or increased warmth or erythema to the wound beds. 11-11-2021 upon evaluation today patient appears to be doing well with regard to her leg ulcer although the lateral foot ulcer on the left is the area of concern. I did actually review the culture results as well as the pathology result from her last visit. The culture actually showed few Staphylococcus simulans as well as a few Staphylococcus epidermis. Subsequently I do believe that putting her on something like Levaquin would be a good option to switch to just 1 medication instead having her on Augmentin and doxycycline which I think is a little bit much for her long-term and since this is indeed showing to be osteomyelitis based [name], [name] [id number] Page 3 of 9 on the pathology report that was also obtained at the last visit I think that we should probably switch her to Levaquin this is 1 time a day dose and it will make it much easier to actually treat the infection without her being on multiple antibiotics at once. Patient and her son are in agreement with this plan. 11-18-2021 upon evaluation today patient appears to be doing well with regard to her wound we did get the x-ray it showed that there was some cortical breakdown on the lateral aspect of the foot right where we know the wound is other than this there did not appear to be any signs of active infection throughout the remainder of the bone that was identifiable on x-ray. I did speak with the radiologist and they recommended that if we were concerned about it further  osteo  spread throughout the foot that we should proceed with MRI or CT scan. Because the patient does have a BB in the periorbital area of her right I will can actually go over the CT scan. 12-02-2021 upon evaluation today patient appears to be doing well currently in regard to her wound this is actually measuring smaller today. With that being said we did see signs of improvement in the overall size of the wound though there was bone exposed. I did review her CT scan as well and this showed that there was no evidence of osteomyelitis noted at this point which means what we are seeing on the pathology previous was all superficial. This is actually good news and that I am hopeful that we have actually cleared away any of the necrotic debris that would be problematic going [name]. Again however I do believe that these were just small fragments in the bone itself does not appear to be grossly infected which is good news. In the end I think that we are on the right track we does need to try to get this last wound closed everything else is doing awesome. Electronic Signature(s) Signed: 12/10/2021 4:05:37 PM By: Worthy Keeler PA-C Entered By: Worthy Keeler on 12/10/2021 16:05:37 -------------------------------------------------------------------------------- Physical Exam Details Patient Name: Date of Service: PA [name], [name]. 12/02/2021 2:15 PM Medical Record Number: [id number] Patient Account Number: [id number] Date of Birth/Sex: Treating RN: 04/29/1933 (88 y.o. [name] Primary Care Provider: [name] Referring Provider: Treating Provider/Extender: [name] in Treatment: 110 Constitutional Well-nourished and well-hydrated in no acute distress. Respiratory normal breathing without difficulty. Psychiatric this patient is able to make decisions and demonstrates good insight into disease process. Alert and Oriented x 3.  pleasant and cooperative. Notes Upon inspection patient's wound bed actually showed signs of good granulation and epithelization at this point. Fortunately I see no evidence of active infection locally or systemically which is great news and overall I am extremely pleased with where we stand today. Electronic Signature(s) Signed: 12/10/2021 4:05:59 PM By: Worthy Keeler PA-C Entered By: Worthy Keeler on 12/10/2021 16:05:59 -------------------------------------------------------------------------------- Physician Orders Details Patient Name: Date of Service: PA [name], [name]. 12/02/2021 2:15 PM [place], [name] [id number] Page 4 of 9 Medical Record Number: [id number] Patient Account Number: [id number] Date of Birth/Sex: Treating RN: 03-23-1933 (88 y.o. [name], [name] Primary Care Provider: [name] Referring Provider: Treating Provider/Extender: [name] in Treatment: 17 Verbal / Phone Orders: No Diagnosis Coding ICD-10 Coding Code Description [phone] Other chronic osteomyelitis, left ankle and foot I73.89 Other specified peripheral vascular diseases E11.621 Type 2 diabetes mellitus with foot ulcer L97.522 Non-pressure chronic ulcer of other part of left foot with fat layer exposed L97.822 Non-pressure chronic ulcer of other part of left lower leg with fat layer exposed L97.422 Non-pressure chronic ulcer of left heel 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 Nurse Visit as needed [employer] wounds with antibacterial soap and water. - dial soap recommended May shower; gently cleanse wound with antibacterial soap, rinse and pat dry prior to dressing wounds No tub bath. Anesthetic  (Use 'Patient Medications' Section for Anesthetic Order Entry) Lidocaine applied to wound bed Edema Control - Lymphedema / Segmental Compressive Device / Other Elevate, Exercise Daily and A  void Standing for Long Periods of Time. Elevate leg(s) parallel to the floor when sitting. DO YOUR BEST to sleep in the bed at night. DO NOT sleep in your recliner. Long hours of sitting in a recliner leads to swelling of the legs and/or potential wounds on your backside. Off-Loading Turn and reposition every 2 hours Other: - keep heels off bed when sleeping, do best to keep pressure off heels Additional Orders / Instructions Other: - verify Apligraf and Grafix Wound Treatment Wound #2 - Foot Wound Laterality: Left, Lateral Cleanser: Soap and Water 1 x Per Day/30 Days Discharge Instructions: Gently cleanse wound with antibacterial soap, rinse and pat dry prior to dressing wounds Prim Dressing: Prisma 4.34 (in) (Generic) 1 x Per Day/30 Days ary Discharge Instructions: Moisten w/normal saline or sterile water; Cover wound as directed. Do not remove from wound bed. Secondary Dressing: ABD Pad 5x9 (in/in) (Generic) 1 x Per Day/30 Days Discharge Instructions: Cover with ABD pad Secured With: Medipore T - 22M Medipore H Soft Cloth Surgical T ape ape, 2x2 (in/yd) (Generic) 1 x Per Day/30 Days Secured With: Kerlix Roll Sterile or Non-Sterile 6-ply 4.5x4 (yd/yd) (DME) (Generic) 1 x Per Day/30 Days Discharge Instructions: Apply Kerlix as directed Secured With: Stretch Net Dressing, Latex-free, Size 5, Small-Head / Shoulder / Thigh 1 x Per Day/30 Days Discharge Instructions: size 4 Electronic Signature(s) Signed: 12/02/2021 5:38:46 PM By: [name] Signed: 12/03/2021 6:28:37 PM By: Worthy Keeler PA-C Entered By: [name] on 12/02/2021 15:28:29 [place], [name] ([id number]) [id number] Page 5 of  9 -------------------------------------------------------------------------------- Problem List Details Patient Name: Date of Service: PA [name] 12/02/2021 2:15 PM Medical Record Number: [id number] Patient Account Number: [id number] Date of Birth/Sex: Treating RN: [birth year] (88 y.o. [name], [name] Primary Care Provider: [name] Referring Provider: Treating Provider/Extender: [name] in Treatment: 17 Active Problems ICD-10 Encounter Code Description Active Date MDM Diagnosis [phone] Other chronic osteomyelitis, left ankle and foot 11/11/2021 No Yes I73.89 Other specified peripheral vascular diseases 08/01/2021 No Yes E11.621 Type 2 diabetes mellitus with foot ulcer 08/01/2021 No Yes L97.522 Non-pressure chronic ulcer of other part of left foot with fat layer exposed 08/01/2021 No Yes L97.822 Non-pressure chronic ulcer of other part of left lower leg with fat layer exposed6/22/2023 No Yes L97.422 Non-pressure chronic ulcer of left heel and midfoot with fat layer exposed 08/01/2021 No Yes F01.50 Vascular dementia, unspecified severity, without behavioral disturbance, 08/01/2021 No Yes psychotic disturbance, mood disturbance, and anxiety Inactive Problems Resolved Problems Electronic Signature(s) Signed: 12/02/2021 2:45:36 PM By: Worthy Keeler PA-C Entered By: Worthy Keeler on 12/02/2021 14:45:35 Reise, [name] ([id number]) [id number] Page 6 of 9 -------------------------------------------------------------------------------- Progress Note Details Patient Name: Date of Service: PA [name] 12/02/2021 2:15 PM Medical Record Number: [id number] Patient Account Number: [id number] Date of Birth/Sex: Treating RN: Jun 26, 1933 (87 y.o. [name] Primary Care Provider: [name] Referring Provider: Treating Provider/Extender: [name] in Treatment: 17 Subjective Chief Complaint Information obtained from Patient Left foot ulcer with osteomyelitis History of Present Illness (HPI) 08-01-2021 upon evaluation today patient appears to be doing better in regard to her wounds over the foot. This on the left foot as well as the lower extremity medially was all due to a peripheral vascular issue and arterial insufficiency. Subsequently the patient did undergo intervention in order to clear this away as far as the blockage was concerned. Dr.  Schnier performed an arteriogram on 06-11-2021. She had a follow-up ABI/TBI performed on 07-11-2021. This showed that there was a post intervention ABI of zero 1.09 on the left with heel monophasic today toe pressure of 0.37 but nonetheless the patient seems to be doing much better and actually appears to have the ability to heal at this time overall extremely pleased with what I am seeing and there is some need for sharp debridement today we will work on that at this point. Currently there is going to need to be some fairly aggressive debridement it does appear to be at this point. Patient has a history of diabetes mellitus type 2, peripheral vascular disease, and dementia. She is seen with her son who actually lives behind her and the patient actually lives alone but he takes good care of her and appears. 08-15-2021 upon evaluation today patient actually appears to be doing awesome in regard to her wounds. She has been tolerating the dressing changes without complication. Fortunately there does not appear to be any signs of active infection locally or systemically at this time. No fevers, chills, nausea, vomiting, or diarrhea. 08-29-2021 upon evaluation today patient appears to be doing well currently in regard to her wound. She has been tolerating the dressing changes without complication all 3 wounds are showing signs of significant improvement. Fortunately I do not see any  evidence of active infection locally or systemically at this time. 09-12-2021 upon evaluation today patient appears to be doing well with regard to her wounds. There is some need for sharp debridement fortunately there does not appear to be any signs of active infection locally or systemically at this time. No fevers, chills, nausea, vomiting, or diarrhea. 09-26-2021 upon evaluation today patient appears to be doing better in regard to her wounds across the board everything is measuring smaller and looks to be doing excellent. I am very pleased with where we stand today. 10-10-2021 upon evaluation today patient appears to be doing well currently in regard to her wounds. In fact the leg ulcer medially as well as the heel ulcer laterally seem to be excellent in fact the lateral heel I think is healed completely. The lateral foot is the worst of the wounds but still seems to be doing decently well based on what I am seeing. Fortunately there is no signs of active infection at this time. 9/27; patient presents for follow-up. She follows with our physician assistant and has not been seen in close to a month. She reports pain to the lateral aspect of the left foot. She states that the medial aspect has scabbed over and is dry with no drainage over the past week. She denies fever/chills, nausea/vomiting or increased warmth or erythema to the wound beds. 11-11-2021 upon evaluation today patient appears to be doing well with regard to her leg ulcer although the lateral foot ulcer on the left is the area of concern. I did actually review the culture results as well as the pathology result from her last visit. The culture actually showed few Staphylococcus simulans as well as a few Staphylococcus epidermis. Subsequently I do believe that putting her on something like Levaquin would be a good option to switch to just 1 medication instead having her on Augmentin and doxycycline which I think is a little bit much for  her long-term and since this is indeed showing to be osteomyelitis based on the pathology report that was also obtained at the last visit I think that we should probably switch  her to Levaquin this is 1 time a day dose and it will make it much easier to actually treat the infection without her being on multiple antibiotics at once. Patient and her son are in agreement with this plan. 11-18-2021 upon evaluation today patient appears to be doing well with regard to her wound we did get the x-ray it showed that there was some cortical breakdown on the lateral aspect of the foot right where we know the wound is other than this there did not appear to be any signs of active infection throughout the remainder of the bone that was identifiable on x-ray. I did speak with the radiologist and they recommended that if we were concerned about it further osteo spread throughout the foot that we should proceed with MRI or CT scan. Because the patient does have a BB in the periorbital area of her right I will can actually go over the CT scan. 12-02-2021 upon evaluation today patient appears to be doing well currently in regard to her wound this is actually measuring smaller today. With that being said we did see signs of improvement in the overall size of the wound though there was bone exposed. I did review her CT scan as well and this showed that there was no evidence of osteomyelitis noted at this point which means what we are seeing on the pathology previous was all superficial. This is actually good news and that I am hopeful that we have actually cleared away any of the necrotic debris that would be problematic going [name]. Again however I do believe that these were just small fragments in the bone itself does not appear to be grossly infected which is good news. In the end I think that we are on the right track we does need to try to get this last wound closed everything else is doing awesome. [name], [name] [id number] Page 7 of 9 Objective Constitutional Well-nourished and well-hydrated in no acute distress. Vitals Time Taken: 3:04 PM, Height: 64 in, Temperature: 97.8 F, Pulse: 84 bpm, Respiratory Rate: 18 breaths/min, Blood Pressure: 159/73 mmHg. Respiratory normal breathing without difficulty. Psychiatric this patient is able to make decisions and demonstrates good insight into disease process. Alert and Oriented x 3. pleasant and cooperative. General Notes: Upon inspection patient's wound bed actually showed signs of good granulation and epithelization at this point. Fortunately I see no evidence of active infection locally or systemically which is great news and overall I am extremely pleased with where we stand today. Integumentary (Hair, Skin) Wound #2 status is Open. Original cause of wound was Gradually Appeared. The date acquired was: 06/26/2021. The wound has been in treatment 17 weeks. The wound is located on the Left,Lateral Foot. The wound measures 0.8cm length x 0.9cm width x 0.5cm depth; 0.565cm^2 area and 0.283cm^3 volume. There is no tunneling or undermining noted. There is a medium amount of serous drainage noted. The wound margin is flat and intact. There is medium (34-66%) red granulation within the wound bed. There is a medium (34-66%) amount of necrotic tissue within the wound bed including Adherent Slough. Assessment Active Problems ICD-10 Other chronic osteomyelitis, left ankle and foot Other specified peripheral vascular diseases Type 2 diabetes mellitus with foot ulcer Non-pressure chronic ulcer of other part of left foot with fat layer exposed Non-pressure chronic ulcer of other part of left lower leg with fat layer exposed Non-pressure chronic ulcer of left heel and midfoot with fat layer exposed Vascular dementia, unspecified  severity, without behavioral disturbance, psychotic disturbance, mood disturbance, and  anxiety Procedures Wound #2 Pre-procedure diagnosis of Wound #2 is a Diabetic Wound/Ulcer of the Lower Extremity located on the Left,Lateral Foot .Severity of Tissue Pre Debridement is: Fat layer exposed. There was a Excisional Skin/Subcutaneous Tissue Debridement with a total area of 1 sq cm performed by Tommie Sams., PA-C. With the following instrument(s): Curette to remove Viable and Non-Viable tissue/material. Material removed includes Callus, Subcutaneous Tissue, Slough, and Biofilm. A time out was conducted at 15:22, prior to the start of the procedure. A Minimum amount of bleeding was controlled with Pressure. The procedure was tolerated well. Post Debridement Measurements: 0.8cm length x 0.9cm width x 0.6cm depth; 0.339cm^3 volume. Character of Wound/Ulcer Post Debridement is stable. Severity of Tissue Post Debridement is: Fat layer exposed. Post procedure Diagnosis Wound #2: Same as Pre-Procedure Plan Follow-up Appointments: Return Appointment in 1 week. Nurse Visit as needed Bathing/ Shower/ Hygiene: Wash wounds with antibacterial soap and water. - dial soap recommended May shower; gently cleanse wound with antibacterial soap, rinse and pat dry prior to dressing wounds No tub bath. Anesthetic (Use 'Patient Medications' Section for Anesthetic Order Entry): Lidocaine applied to wound bed Edema Control - Lymphedema / Segmental Compressive Device / Other: Elevate, Exercise Daily and Avoid Standing for Long Periods of Time. Elevate leg(s) parallel to the floor when sitting. DO YOUR BEST to sleep in the bed at night. DO NOT sleep in your recliner. Long hours of sitting in a recliner leads to swelling of the legs and/or potential wounds on your backside. Off-Loading: Turn and reposition every 2 hours Other: - keep heels off bed when sleeping, do best to keep pressure off heels Additional Orders / Instructions: Other: - verify Apligraf and Grafix WOUND #2: - Foot Wound Laterality:  Left, Lateral Cleanser: Soap and Water 1 x Per Day/30 Days [name], [name] ([id number]) [id number] Page 8 of 9 Discharge Instructions: Gently cleanse wound with antibacterial soap, rinse and pat dry prior to dressing wounds Prim Dressing: Prisma 4.34 (in) (Generic) 1 x Per Day/30 Days ary Discharge Instructions: Moisten w/normal saline or sterile water; Cover wound as directed. Do not remove from wound bed. Secondary Dressing: ABD Pad 5x9 (in/in) (Generic) 1 x Per Day/30 Days Discharge Instructions: Cover with ABD pad Secured With: Medipore T - 82M Medipore H Soft Cloth Surgical T ape ape, 2x2 (in/yd) (Generic) 1 x Per Day/30 Days Secured With: Kerlix Roll Sterile or Non-Sterile 6-ply 4.5x4 (yd/yd) (DME) (Generic) 1 x Per Day/30 Days Discharge Instructions: Apply Kerlix as directed Secured With: Stretch Net Dressing, Latex-free, Size 5, Small-Head / Shoulder / Thigh 1 x Per Day/30 Days Discharge Instructions: size 4 1. I am going to recommend that we have the patient continue to be monitored for any signs of worsening or infection. Her son is actually treating her and he does a great job taking care of this at home we are using collagen and then following this with an ABD pad and roll gauze to secure in place. 2. I am also can recommend that we have the patient continue with offloading and keeping pressure off of the foot she really does not want that is really not a problem. 3 we do some some stretching at to hold everything in place and I think that is doing a great job. We will see patient back for reevaluation in 1 week here in the clinic. If anything worsens or changes patient will contact our office for additional recommendations. Electronic  Signature(s) Signed: 12/10/2021 4:06:36 PM By: Worthy Keeler PA-C Entered By: Worthy Keeler on 12/10/2021 16:06:35 -------------------------------------------------------------------------------- SuperBill  Details Patient Name: Date of Service: PA [name], [name] 12/02/2021 Medical Record Number: [id number] Patient Account Number: [id number] Date of Birth/Sex: Treating RN: 13-Aug-1933 (88 y.o. 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 F01.50 Vascular dementia, unspecified severity, without behavioral disturbance, psychotic disturbance, mood disturbance, and anxiety Facility Procedures : CPT4 Code: [id number] Description: [place] - DEB SUBQ TISSUE 20 SQ CM/< ICD-10 Diagnosis Description L97.522 Non-pressure chronic ulcer of other part of left foot with fat layer exposed Modifier: Quantity: 1 Physician Procedures : CPT4 Code Description Modifier [id number] - WC PHYS SUBQ TISS 20 SQ CM ICD-10 Diagnosis Description L97.522 Non-pressure chronic ulcer of other part of left foot with fat layer exposed [place], [name] ([id number])  [id number] Page 9 of 9 Quantity: 1 Electronic Signature(s) Signed: 12/10/2021 4:06:56 PM By: Worthy Keeler PA-C Previous Signature: 12/04/2021 9:46:42 AM Version By: Gretta Cool BSN, RN, CWS, [name] RN, BSN Previous Signature: 12/05/2021 5:00:04 PM Version By: Worthy Keeler PA-C Entered By: Worthy Keeler on 12/10/2021 16:06:55

## 2021-12-02 NOTE — Progress Notes (Addendum)
[name], [name] [id number] Page 1 of 8 Visit Report for 12/02/2021 Arrival Information Details Patient Name: Date of Service: PA [name] 12/02/2021 2:15 PM Medical Record Number: [id number] Patient Account Number: [id number] Date of Birth/Sex: Treating RN: 01-13-1934 (88 y.o. [name], [name] Primary Care Sohrab Keelan: [name] Referring Dena Esperanza: Treating Aurel Nguyen/Extender: [name] in Treatment: 17 Visit Information History Since Last Visit All ordered tests and consults were completed: No Patient Arrived: Wheel Chair Added or deleted any medications: No Arrival Time: 14:58 Any new allergies or adverse reactions: No Transfer Assistance: EasyPivot Patient Lift Had a fall or experienced change in No Patient Requires Transmission-Based Precautions: No activities of daily living that may affect Patient Has Alerts: Yes risk of falls: Patient Alerts: Patient on Blood Thinner Hospitalized since last visit: No ABI 07/11/21 R 1.11 TBI 0.3 Pain Present Now: Yes ABI 07/11/21 L 1.09 TBI 0.3 Electronic Signature(s) Signed: 12/02/2021 5:38:46 PM By: [name] Entered By: [name] on 12/02/2021 15:03:25 -------------------------------------------------------------------------------- Clinic Level of Care Assessment Details Patient Name: Date of Service: PA [name] 12/02/2021 2:15 PM Medical Record Number: [id number] Patient Account Number: [id number] Date of Birth/Sex: Treating RN: 31-Jan-1934 (88 y.o. [name] Primary Care Drevin Ortner: [name] Referring [name]: Treating [name]/Extender: [name] in Treatment: 17 Clinic Level of Care Assessment Items TOOL 1 Quantity Score []  - 0 Use when EandM and Procedure is performed on INITIAL visit ASSESSMENTS - Nursing Assessment / Reassessment []  -  0 General Physical Exam (combine w/ comprehensive assessment (listed just below) when performed on new pt. evals) []  - 0 Comprehensive Assessment (HX, ROS, Risk Assessments, Wounds Hx, etc.) ASSESSMENTS - Wound and Skin Assessment / Reassessment []  - 0 Dermatologic / Skin Assessment (not related to wound area) ASSESSMENTS - Ostomy and/or Continence Assessment and Care RORY, KEIMIG ([id number]) [id number] Page 2 of 8 []  - 0 Incontinence Assessment and Management []  - 0 Ostomy Care Assessment and Management (repouching, etc.) PROCESS - Coordination of Care []  - 0 Simple Patient / Family Education for ongoing care []  - 0 Complex (extensive) Patient / Family Education for ongoing care []  - 0 Staff obtains Programmer, systems, Records, T Results / Process Orders est []  - 0 Staff telephones HHA, Nursing Homes / Clarify orders / etc []  - 0 Routine Transfer to another Facility (non-emergent condition) []  - 0 Routine Hospital Admission (non-emergent condition) []  - 0 New Admissions / Biomedical engineer / Ordering NPWT Apligraf, etc. , []  - 0 Emergency Hospital Admission (emergent condition) PROCESS - Special Needs []  - 0 Pediatric / Minor Patient Management []  - 0 Isolation Patient Management []  - 0 Hearing / Language / Visual special needs []  - 0 Assessment of Community assistance (transportation, D/C planning, etc.) []  - 0 Additional assistance / Altered mentation []  - 0 Support Surface(s) Assessment (bed, cushion, seat, etc.) INTERVENTIONS - Miscellaneous []  - 0 External ear exam []  - 0 Patient Transfer (multiple staff / Civil Service fast streamer / Similar devices) []  - 0 Simple Staple / Suture removal (25 or less) []  - 0 Complex Staple / Suture removal (26 or more) []  - 0 Hypo/Hyperglycemic Management (do not check if billed separately) []  - 0 Ankle / Brachial Index (ABI) - do not check if billed separately Has the patient been seen at the hospital  within the last three years: Yes Total Score: 0 Level Of Care: ____ Electronic Signature(s) Signed: 12/02/2021  5:38:46 PM By: [name] Entered By: [name] on 12/02/2021 15:28:35 -------------------------------------------------------------------------------- Encounter Discharge Information Details Patient Name: Date of Service: [name], [name] Level 12/02/2021 2:15 PM Medical Record Number: [id number] Patient Account Number: [id number] Date of Birth/Sex: Treating RN: September 07, 1933 (88 y.o. [name], [name] Primary Care Orvetta Danielski: [name] Referring Tyja Gortney: Treating Shayne Diguglielmo/Extender: [name] in Treatment: 17 Encounter Discharge Information Items Post Procedure Vitals Discharge Condition: Stable Temperature (F): 97.8 Oleson, Chaya G ([id number]) [id number] Page 3 of 8 Ambulatory Status: Wheelchair Pulse (bpm): 84 Discharge Destination: Home Respiratory Rate (breaths/min): 18 Transportation: Private Auto Blood Pressure (mmHg): 159/73 Accompanied By: son Schedule Follow-up Appointment: Yes Clinical Summary of Care: Electronic Signature(s) Signed: 12/02/2021 5:38:46 PM By: [name] Entered By: [name] on 12/02/2021 17:24:04 -------------------------------------------------------------------------------- Lower Extremity Assessment Details Patient Name: Date of Service: [name], [name] 12/02/2021 2:15 PM Medical Record Number: [id number] Patient Account Number: [id number] Date of Birth/Sex: Treating RN: 09/20/1933 (88 y.o. [name], [name] Primary Care Ottilia Pippenger: [name] Referring Lindalee Huizinga: Treating Donne Robillard/Extender: [name] in Treatment: 17 Electronic Signature(s) Signed: 12/02/2021 5:38:46 PM By: [name] Signed: 12/02/2021 6:00:27 PM By: Gretta Cool BSN, RN, CWS, [name] RN, BSN Entered By: [name] on 12/02/2021 15:13:07 -------------------------------------------------------------------------------- Multi Wound Chart Details Patient Name: Date of Service: [name], [name] 12/02/2021 2:15 PM Medical Record Number: [id number] Patient Account Number: [id number] Date of Birth/Sex: Treating RN: September 19, 1933 (88 y.o. [name], [name] Primary Care Laiyla Slagel: [name] Referring Phyllis Whitefield: Treating Leianna Barga/Extender: [name] in Treatment: 17 Vital Signs Height(in): 18 Pulse(bpm): 62 Weight(lbs): Blood Pressure(mmHg): 159/73 Body Mass Index(BMI): Temperature(F): 97.8 Respiratory Rate(breaths/min): 18 [2:Photos:] [N/A:N/A] Left, Lateral Foot N/A N/A Wound Location: Gradually Appeared N/A N/A Wounding Event: Diabetic Wound/Ulcer of the Lower N/A N/A Primary Etiology: Extremity Hypertension, Type II Diabetes, N/A N/A Comorbid History: Osteoarthritis, Neuropathy 06/26/2021 N/A N/A Date Acquired: 17 N/A N/A Weeks of Treatment: Open N/A N/A Wound Status: No N/A N/A Wound Recurrence: 0.8x0.9x0.5 N/A N/A Measurements L x W x D (cm) 0.565 N/A N/A A (cm) : rea 0.283 N/A N/A Volume (cm) : 73.40% N/A N/A % Reduction in A rea: -33.50% N/A N/A % Reduction in Volume: Grade 3 N/A N/A Classification: Medium N/A N/A Exudate A mount: Serous N/A N/A Exudate Type: amber N/A N/A Exudate Color: Flat and Intact N/A N/A Wound Margin: Medium (34-66%) N/A N/A Granulation A mount: Red N/A N/A Granulation Quality: Medium (34-66%) N/A N/A Necrotic A mount: Fascia: No N/A N/A Exposed Structures: Fat Layer (Subcutaneous Tissue): No Tendon: No Muscle: No Joint: No Bone: No None N/A N/A Epithelialization: Treatment Notes Electronic Signature(s) Signed: 12/02/2021 5:38:46 PM By: [name] Entered By: [name] on 12/02/2021  15:13:52 -------------------------------------------------------------------------------- Multi-Disciplinary Care Plan Details Patient Name: Date of Service: PA E. Lopez, [name]. 12/02/2021 2:15 PM Medical Record Number: [id number] Patient Account Number: [id number] Date of Birth/Sex: Treating RN: [birth year] (88 y.o. [name] Primary Care Verita Kuroda: [name] Referring Izrael Peak: Treating Jarrel Knoke/Extender: [name] in Treatment: 17 Active Inactive Wound/Skin Impairment Nursing Diagnoses: Impaired tissue integrity Knowledge deficit related to ulceration/compromised skin integrity Goals: Ulcer/skin breakdown will have a volume reduction of 30% by week 4 [place], Maizie G ([id number]) [id number] Page 5 of 8 Date Initiated: 08/01/2021 Date Inactivated: 11/18/2021 Target Resolution Date: 10/10/2021 Goal Status: Unmet Unmet Reason: comordbitis Ulcer/skin breakdown will  have a volume reduction of 50% by week 8 Date Initiated: 08/01/2021 Date Inactivated: 11/18/2021 Target Resolution Date: 10/12/2021 Goal Status: Unmet Unmet Reason: comordbitis Ulcer/skin breakdown will have a volume reduction of 80% by week 12 Date Initiated: 08/01/2021 Date Inactivated: 11/18/2021 Target Resolution Date: 10/24/2021 Goal Status: Unmet Unmet Reason: comordbities Ulcer/skin breakdown will heal within 14 weeks Date Initiated: 08/01/2021 Target Resolution Date: 12/07/2021 Goal Status: Active Interventions: Assess patient/caregiver ability to obtain necessary supplies Assess patient/caregiver ability to perform ulcer/skin care regimen upon admission and as needed Assess ulceration(s) every visit Provide education on ulcer and skin care Notes: Electronic Signature(s) Signed: 12/02/2021 5:38:46 PM By: [name] Signed: 12/02/2021 6:00:27 PM By: Gretta Cool, BSN, RN, CWS, [name] RN, BSN Entered By: [name] on  12/02/2021 15:13:45 -------------------------------------------------------------------------------- Pain Assessment Details Patient Name: Date of Service: [name], [name] 12/02/2021 2:15 PM Medical Record Number: [id number] Patient Account Number: [id number] Date of Birth/Sex: Treating RN: 01/22/1934 (88 y.o. [name] Primary Care Deunta Beneke: [name] Referring Anthonymichael Munday: Treating Kenyatte Gruber/Extender: [name] in Treatment: 17 Active Problems Location of Pain Severity and Description of Pain Patient Has Paino Yes Site Locations Pain Location: Pain in Ulcers Duration of the Pain. Constant / Intermittento Constant Rate the pain. Current Pain Level: 3 Character of Pain Describe the Pain: Aching, Burning Pain Management and Medication Coachman, Min G ([id number]) [id number] Page 6 of 8 Current Pain Management: Medication: Yes Rest: Yes Electronic Signature(s) Signed: 12/02/2021 5:38:46 PM By: [name] Signed: 12/02/2021 6:00:27 PM By: Gretta Cool, BSN, RN, CWS, [name] RN, BSN Entered By: [name] on 12/02/2021 15:06:34 -------------------------------------------------------------------------------- Patient/Caregiver Education Details Patient Name: Date of Service: [name], [name] 10/23/2023andnbsp2:15 PM Medical Record Number: [id number] Patient Account Number: [id number] Date of Birth/Gender: Treating RN: 03-Jan-1934 (88 y.o. [name] Primary Care Physician: [name] Referring Physician: Treating Physician/Extender: [name] in Treatment: 17 Education Assessment Education Provided To: Patient and Caregiver Education Topics Provided Wound/Skin Impairment: Handouts: Other: continue wound care as directed Methods: Explain/Verbal Responses: State content correctly Electronic Signature(s) Signed:  12/02/2021 5:38:46 PM By: [name] Entered By: [name] on 12/02/2021 15:29:07 -------------------------------------------------------------------------------- Wound Assessment Details Patient Name: Date of Service: [name], [name] 12/02/2021 2:15 PM Medical Record Number: [id number] Patient Account Number: [id number] Date of Birth/Sex: Treating RN: 1933/04/05 (88 y.o. [name] Primary Care Migdalia Olejniczak: [name] Referring Richey Doolittle: Treating Lorenso Quirino/Extender: [name] in Treatment: 17 Wound Status Wound Number: 2 Primary Etiology: Diabetic Wound/Ulcer of the Lower Extremity SHALI, HAU ([id number]) [id number] Page 7 of 8 Wound Location: Left, Lateral Foot Wound Status: Open Wounding Event: Gradually Appeared Comorbid History: Hypertension, Type II Diabetes, Osteoarthritis, Neuropathy Date Acquired: 06/26/2021 Weeks Of Treatment: 17 Clustered Wound: No Photos Wound Measurements Length: (cm) 0.8 Width: (cm) 0.9 Depth: (cm) 0.5 Area: (cm) 0.565 Volume: (cm) 0.283 % Reduction in Area: 73.4% % Reduction in Volume: -33.5% Epithelialization: None Tunneling: No Undermining: No Wound Description Classification: Grade 3 Wound Margin: Flat and Intact Exudate Amount: Medium Exudate Type: Serous Exudate Color: amber Foul Odor After Cleansing: No Slough/Fibrino Yes Wound Bed Granulation Amount: Medium (34-66%) Exposed Structure Granulation Quality: Red Fascia Exposed: No Necrotic Amount: Medium (34-66%) Fat Layer (Subcutaneous Tissue) Exposed: No Necrotic Quality: Adherent Slough Tendon Exposed: No Muscle Exposed: No Joint Exposed: No Bone Exposed: No Treatment Notes Wound #2 (Foot) Wound Laterality: Left, Lateral Cleanser Soap  and Water Discharge Instruction: Gently cleanse wound with antibacterial soap, rinse and pat dry prior to dressing wounds Peri-Wound  Care Topical Primary Dressing Prisma 4.34 (in) Discharge Instruction: Moisten w/normal saline or sterile water; Cover wound as directed. Do not remove from wound bed. Secondary Dressing ABD Pad 5x9 (in/in) Discharge Instruction: Cover with ABD pad Secured With Medipore T - 15M Medipore H Soft Cloth Surgical T ape ape, 2x2 (in/yd) Kerlix Roll Sterile or Non-Sterile 6-ply 4.5x4 (yd/yd) Discharge Instruction: Apply Kerlix as directed Stretch Net Dressing, Latex-free, Size 5, Small-Head / Shoulder / Thigh Discharge Instruction: size 4 Compression Wrap Compression Stockings Wurth, Leilene G ([id number]) [id number] Page 8 of 8 Add-Ons Electronic Signature(s) Signed: 12/02/2021 5:38:46 PM By: [name] Signed: 12/02/2021 6:00:27 PM By: Gretta Cool, BSN, RN, CWS, [name] RN, BSN Entered By: [name] on 12/02/2021 15:12:47 -------------------------------------------------------------------------------- Vitals Details Patient Name: Date of Service: [name], [name]. 12/02/2021 2:15 PM Medical Record Number: [id number] Patient Account Number: [id number] Date of Birth/Sex: Treating RN: 12-12-1933 (88 y.o. [name] Primary Care Jvon Meroney: [name] Referring Hephzibah Strehle: Treating Kamalei Roeder/Extender: [name] in Treatment: 17 Vital Signs Time Taken: 15:04 Temperature (F): 97.8 Height (in): 64 Pulse (bpm): 84 Respiratory Rate (breaths/min): 18 Blood Pressure (mmHg): 159/73 Reference Range: 80 - 120 mg / dl Electronic Signature(s) Signed: 12/02/2021 5:38:46 PM By: [name] Entered By: [name] on 12/02/2021 15:06:10

## 2021-12-03 DIAGNOSIS — L97422 Non-pressure chronic ulcer of left heel and midfoot with fat layer exposed: Secondary | ICD-10-CM | POA: Diagnosis not present

## 2021-12-03 DIAGNOSIS — L97822 Non-pressure chronic ulcer of other part of left lower leg with fat layer exposed: Secondary | ICD-10-CM | POA: Diagnosis not present

## 2021-12-03 DIAGNOSIS — E11621 Type 2 diabetes mellitus with foot ulcer: Secondary | ICD-10-CM | POA: Diagnosis not present

## 2021-12-03 DIAGNOSIS — I7389 Other specified peripheral vascular diseases: Secondary | ICD-10-CM | POA: Diagnosis not present

## 2021-12-03 LAB — POCT I-STAT CREATININE: Creatinine, Ser: 0.7 mg/dL (ref 0.44–1.00)

## 2021-12-16 ENCOUNTER — Encounter: Payer: Medicare HMO | Attending: Physician Assistant | Admitting: Internal Medicine

## 2021-12-16 DIAGNOSIS — L97522 Non-pressure chronic ulcer of other part of left foot with fat layer exposed: Secondary | ICD-10-CM | POA: Insufficient documentation

## 2021-12-16 DIAGNOSIS — E1169 Type 2 diabetes mellitus with other specified complication: Secondary | ICD-10-CM | POA: Insufficient documentation

## 2021-12-16 DIAGNOSIS — L97822 Non-pressure chronic ulcer of other part of left lower leg with fat layer exposed: Secondary | ICD-10-CM | POA: Insufficient documentation

## 2021-12-16 DIAGNOSIS — L97422 Non-pressure chronic ulcer of left heel and midfoot with fat layer exposed: Secondary | ICD-10-CM | POA: Insufficient documentation

## 2021-12-16 DIAGNOSIS — L97524 Non-pressure chronic ulcer of other part of left foot with necrosis of bone: Secondary | ICD-10-CM | POA: Insufficient documentation

## 2021-12-16 DIAGNOSIS — M86672 Other chronic osteomyelitis, left ankle and foot: Secondary | ICD-10-CM | POA: Insufficient documentation

## 2021-12-16 DIAGNOSIS — E11622 Type 2 diabetes mellitus with other skin ulcer: Secondary | ICD-10-CM | POA: Insufficient documentation

## 2021-12-16 DIAGNOSIS — E11621 Type 2 diabetes mellitus with foot ulcer: Secondary | ICD-10-CM | POA: Insufficient documentation

## 2021-12-16 DIAGNOSIS — F0150 Vascular dementia without behavioral disturbance: Secondary | ICD-10-CM | POA: Diagnosis not present

## 2021-12-16 DIAGNOSIS — I7389 Other specified peripheral vascular diseases: Secondary | ICD-10-CM | POA: Diagnosis not present

## 2021-12-16 DIAGNOSIS — S91302A Unspecified open wound, left foot, initial encounter: Secondary | ICD-10-CM | POA: Diagnosis not present

## 2021-12-18 NOTE — Progress Notes (Signed)
[name], [name] ([id number]) [id number] Page 1 of 7 Visit Report for 12/16/2021 HPI Details Patient Name: Date of Service: [name] 12/16/2021 9:00 A M Medical Record Number: [id number] Patient Account Number: [id number] Date of Birth/Sex: Treating RN: [birth year] (88 y.o. [name] Primary Care Provider: [name] Other Clinician: Betha Loa Referring Provider: Treating Provider/Extender: RO BSO N, MICHA EL Chauncy Passy in Treatment: 19 History of Present Illness HPI Description: 08-01-2021 upon evaluation today patient appears to be doing better in regard to her wounds over the foot. This on the left foot as well as the lower extremity medially was all due to a peripheral vascular issue and arterial insufficiency. Subsequently the patient did undergo intervention in order to clear this away as far as the blockage was concerned. Dr. Gilda Crease performed an arteriogram on 06-11-2021. She had a follow-up ABI/TBI performed on 07-11-2021. This showed that there was a post intervention ABI of zero 1.09 on the left with heel monophasic today toe pressure of 0.37 but nonetheless the patient seems to be doing much better and actually appears to have the ability to heal at this time overall extremely pleased with what I am seeing and there is some need for sharp debridement today we will work on that at this point. Currently there is going to need to be some fairly aggressive debridement it does appear to be at this point. Patient has a history of diabetes mellitus type 2, peripheral vascular disease, and dementia. She is seen with her son who actually lives behind her and the patient actually lives alone but he takes good care of her and appears. 08-15-2021 upon evaluation today patient actually appears to be doing awesome in regard to her wounds. She has been tolerating the dressing changes without complication. Fortunately there does not appear  to be any signs of active infection locally or systemically at this time. No fevers, chills, nausea, vomiting, or diarrhea. 08-29-2021 upon evaluation today patient appears to be doing well currently in regard to her wound. She has been tolerating the dressing changes without complication all 3 wounds are showing signs of significant improvement. Fortunately I do not see any evidence of active infection locally or systemically at this time. 09-12-2021 upon evaluation today patient appears to be doing well with regard to her wounds. There is some need for sharp debridement fortunately there does not appear to be any signs of active infection locally or systemically at this time. No fevers, chills, nausea, vomiting, or diarrhea. 09-26-2021 upon evaluation today patient appears to be doing better in regard to her wounds across the board everything is measuring smaller and looks to be doing excellent. I am very pleased with where we stand today. 10-10-2021 upon evaluation today patient appears to be doing well currently in regard to her wounds. In fact the leg ulcer medially as well as the heel ulcer laterally seem to be excellent in fact the lateral heel I think is healed completely. The lateral foot is the worst of the wounds but still seems to be doing decently well based on what I am seeing. Fortunately there is no signs of active infection at this time. 9/27; patient presents for follow-up. She follows with our physician assistant and has not been seen in close to a month. She reports pain to the lateral aspect of the left foot. She states that the medial aspect has scabbed over and is dry with no drainage over the past week. She denies fever/chills,  nausea/vomiting or increased warmth or erythema to the wound beds. 11-11-2021 upon evaluation today patient appears to be doing well with regard to her leg ulcer although the lateral foot ulcer on the left is the area of concern. I did actually review the  culture results as well as the pathology result from her last visit. The culture actually showed few Staphylococcus simulans as well as a few Staphylococcus epidermis. Subsequently I do believe that putting her on something like Levaquin would be a good option to switch to just 1 medication instead having her on Augmentin and doxycycline which I think is a little bit much for her long-term and since this is indeed showing to be osteomyelitis based on the pathology report that was also obtained at the last visit I think that we should probably switch her to Levaquin this is 1 time a day dose and it will make it much easier to actually treat the infection without her being on multiple antibiotics at once. Patient and her son are in agreement with this plan. 11-18-2021 upon evaluation today patient appears to be doing well with regard to her wound we did get the x-ray it showed that there was some cortical breakdown on the lateral aspect of the foot right where we know the wound is other than this there did not appear to be any signs of active infection throughout the remainder of the bone that was identifiable on x-ray. I did speak with the radiologist and they recommended that if we were concerned about it further osteo spread throughout the foot that we should proceed with MRI or CT scan. Because the patient does have a BB in the periorbital area of her right I will can actually go over the CT scan. 12-02-2021 upon evaluation today patient appears to be doing well currently in regard to her wound this is actually measuring smaller today. With that being said we did see signs of improvement in the overall size of the wound though there was bone exposed. I did review her CT scan as well and this showed that there was no evidence of osteomyelitis noted at this point which means what we are seeing on the pathology previous was all superficial. This is actually good news and that I am hopeful that we have  actually cleared away any of the necrotic debris that would be problematic going forward. Again however I do believe that these were just small fragments in the bone itself does not appear to be grossly infected which is good news. In the end I think that we are on the right track we does need to try to get this last wound closed everything else is doing awesome. 11/6; small punched out area on the left lateral foot at the level of the base of the fifth metatarsal. Currently using Prisma. This is not a weightbearing surface According to our intake nurse has been some thoughts of using Grafix however we are still trying to get the prior approval arranged. She does have a history of PAD her most recent ABI was 1.09 KYNLIE, JANE ([id number]) [id number] Page 2 of 7 Electronic Signature(s) Signed: 12/16/2021 4:14:14 PM By: Baltazar Najjar MD Entered By: Baltazar Najjar on 12/16/2021 09:30:51 -------------------------------------------------------------------------------- Physical Exam Details Patient Name: Date of Service: [name], HA LLIE G. 12/16/2021 9:00 A M Medical Record Number: [id number] Patient Account Number: [id number] Date of Birth/Sex: Treating RN: [birth year] (88 y.o. [name] Primary Care Provider: [name] Other Clinician: Betha Loa Referring  Provider: Treating Provider/Extender: RO BSO N, MICHA EL Chauncy Passy in Treatment: 19 Cardiovascular Dorsalis pedis and posterior tibial pulse on the left are palpable maybe not robust however. Her foot is warm. Notes Wound exam; small punched out wound. There is significant depth but no palpable bone today. Under illumination the surface of the wound has slough which I had attempted to wash off. She is far too tender around this to consider debridement today which is unfortunate. No clear evidence of infection Electronic Signature(s) Signed: 12/16/2021 4:14:14 PM By: Baltazar Najjar  MD Entered By: Baltazar Najjar on 12/16/2021 09:32:11 -------------------------------------------------------------------------------- Physician Orders Details Patient Name: Date of Service: [name], Cinda Quest. 12/16/2021 9:00 A M Medical Record Number: [id number] Patient Account Number: [id number] Date of Birth/Sex: Treating RN: 1933-07-16 (88 y.o. [name] Primary Care Provider: [name] Other Clinician: Betha Loa Referring Provider: Treating Provider/Extender: RO BSO N, MICHA EL Chauncy Passy in Treatment: 19 Verbal / Phone Orders: No Diagnosis Coding Follow-up Appointments Return Appointment in 1 week. Nurse Visit as needed [employer] wounds with antibacterial soap and water. - dial soap recommended May shower; gently cleanse wound with antibacterial soap, rinse and pat dry prior to dressing wounds No tub bath. Anesthetic (Use 'Patient Medications' Section for Anesthetic Order Entry) Lidocaine applied to wound bed LESLEA, VOWLES ([id number]) [id number] Page 3 of 7 Edema Control - Lymphedema / Segmental Compressive Device / Other Elevate, Exercise Daily and A void Standing for Long Periods of Time. Elevate leg(s) parallel to the floor when sitting. DO YOUR BEST to sleep in the bed at night. DO NOT sleep in your recliner. Long hours of sitting in a recliner leads to swelling of the legs and/or potential wounds on your backside. Off-Loading Turn and reposition every 2 hours Other: - keep heels off bed when sleeping, do best to keep pressure off heels Additional Orders / Instructions Other: - waiting for PA for Grafix PL Prime Wound Treatment Wound #2 - Foot Wound Laterality: Left, Lateral Cleanser: Soap and Water 1 x Per Day/30 Days Discharge Instructions: Gently cleanse wound with antibacterial soap, rinse and pat dry prior to dressing wounds Prim Dressing: Prisma 4.34 (in) (Generic) 1 x Per Day/30  Days ary Discharge Instructions: Moisten w/normal saline or sterile water; Cover wound as directed. Do not remove from wound bed. Secondary Dressing: ABD Pad 5x9 (in/in) (Generic) 1 x Per Day/30 Days Discharge Instructions: Cover with ABD pad Secured With: Medipore T - 1M Medipore H Soft Cloth Surgical T ape ape, 2x2 (in/yd) (Generic) 1 x Per Day/30 Days Secured With: Kerlix Roll Sterile or Non-Sterile 6-ply 4.5x4 (yd/yd) (Generic) 1 x Per Day/30 Days Discharge Instructions: Apply Kerlix as directed Secured With: Stretch Net Dressing, Latex-free, Size 5, Small-Head / Shoulder / Thigh 1 x Per Day/30 Days Discharge Instructions: size 4 Electronic Signature(s) Signed: 12/16/2021 4:14:14 PM By: Baltazar Najjar MD Signed: 12/17/2021 5:15:48 PM By: Betha Loa Entered By: Betha Loa on 12/16/2021 09:27:25 -------------------------------------------------------------------------------- Problem List Details Patient Name: Date of Service: [name], HA LLIE G. 12/16/2021 9:00 A M Medical Record Number: [id number] Patient Account Number: [id number] Date of Birth/Sex: Treating RN: 1933/11/02 (88 y.o. [name] Primary Care Provider: [name] Other Clinician: Betha Loa Referring Provider: Treating Provider/Extender: RO BSO N, MICHA EL Chauncy Passy in Treatment: 19 Active Problems ICD-10 Encounter Code Description Active Date MDM Diagnosis [phone] Other chronic osteomyelitis, left ankle and foot 11/11/2021 No Yes I73.89 Other specified  peripheral vascular diseases 08/01/2021 No Yes E11.621 Type 2 diabetes mellitus with foot ulcer 08/01/2021 No Yes Back, Roberto G ([id number]) [id number] Page 4 of 7 [phone] Non-pressure chronic ulcer of other part of left foot with fat layer exposed 08/01/2021 No Yes L97.822 Non-pressure chronic ulcer of other part of left lower leg with fat layer exposed6/22/2023 No Yes L97.422 Non-pressure chronic ulcer  of left heel and midfoot with fat layer exposed 08/01/2021 No Yes F01.50 Vascular dementia, unspecified severity, without behavioral disturbance, 08/01/2021 No Yes psychotic disturbance, mood disturbance, and anxiety Inactive Problems Resolved Problems Electronic Signature(s) Signed: 12/16/2021 4:14:14 PM By: Baltazar Najjar MD Entered By: Baltazar Najjar on 12/16/2021 09:28:55 -------------------------------------------------------------------------------- Progress Note Details Patient Name: Date of Service: [name], Cinda Quest. 12/16/2021 9:00 A M Medical Record Number: [id number] Patient Account Number: [id number] Date of Birth/Sex: Treating RN: [birth year] (88 y.o. [name] Primary Care Provider: [name] Other Clinician: Betha Loa Referring Provider: Treating Provider/Extender: RO BSO N, MICHA EL Elveria Rising, John Weeks in Treatment: 19 Subjective History of Present Illness (HPI) 08-01-2021 upon evaluation today patient appears to be doing better in regard to her wounds over the foot. This on the left foot as well as the lower extremity medially was all due to a peripheral vascular issue and arterial insufficiency. Subsequently the patient did undergo intervention in order to clear this away as far as the blockage was concerned. Dr. Gilda Crease performed an arteriogram on 06-11-2021. She had a follow-up ABI/TBI performed on 07-11-2021. This showed that there was a post intervention ABI of zero 1.09 on the left with heel monophasic today toe pressure of 0.37 but nonetheless the patient seems to be doing much better and actually appears to have the ability to heal at this time overall extremely pleased with what I am seeing and there is some need for sharp debridement today we will work on that at this point. Currently there is going to need to be some fairly aggressive debridement it does appear to be at this point. Patient has a history of diabetes mellitus type 2, peripheral  vascular disease, and dementia. She is seen with her son who actually lives behind her and the patient actually lives alone but he takes good care of her and appears. 08-15-2021 upon evaluation today patient actually appears to be doing awesome in regard to her wounds. She has been tolerating the dressing changes without complication. Fortunately there does not appear to be any signs of active infection locally or systemically at this time. No fevers, chills, nausea, vomiting, or diarrhea. 08-29-2021 upon evaluation today patient appears to be doing well currently in regard to her wound. She has been tolerating the dressing changes without complication all 3 wounds are showing signs of significant improvement. Fortunately I do not see any evidence of active infection locally or systemically at this time. 09-12-2021 upon evaluation today patient appears to be doing well with regard to her wounds. There is some need for sharp debridement fortunately there does not appear to be any signs of active infection locally or systemically at this time. No fevers, chills, nausea, vomiting, or diarrhea. 09-26-2021 upon evaluation today patient appears to be doing better in regard to her wounds across the board everything is measuring smaller and looks to be doing excellent. I am very pleased with where we stand today. CHEYANE, AYON ([id number]) [id number] Page 5 of 7 10-10-2021 upon evaluation today patient appears to be doing well currently in regard to her wounds. In fact  the leg ulcer medially as well as the heel ulcer laterally seem to be excellent in fact the lateral heel I think is healed completely. The lateral foot is the worst of the wounds but still seems to be doing decently well based on what I am seeing. Fortunately there is no signs of active infection at this time. 9/27; patient presents for follow-up. She follows with our physician assistant and has not been seen in close  to a month. She reports pain to the lateral aspect of the left foot. She states that the medial aspect has scabbed over and is dry with no drainage over the past week. She denies fever/chills, nausea/vomiting or increased warmth or erythema to the wound beds. 11-11-2021 upon evaluation today patient appears to be doing well with regard to her leg ulcer although the lateral foot ulcer on the left is the area of concern. I did actually review the culture results as well as the pathology result from her last visit. The culture actually showed few Staphylococcus simulans as well as a few Staphylococcus epidermis. Subsequently I do believe that putting her on something like Levaquin would be a good option to switch to just 1 medication instead having her on Augmentin and doxycycline which I think is a little bit much for her long-term and since this is indeed showing to be osteomyelitis based on the pathology report that was also obtained at the last visit I think that we should probably switch her to Levaquin this is 1 time a day dose and it will make it much easier to actually treat the infection without her being on multiple antibiotics at once. Patient and her son are in agreement with this plan. 11-18-2021 upon evaluation today patient appears to be doing well with regard to her wound we did get the x-ray it showed that there was some cortical breakdown on the lateral aspect of the foot right where we know the wound is other than this there did not appear to be any signs of active infection throughout the remainder of the bone that was identifiable on x-ray. I did speak with the radiologist and they recommended that if we were concerned about it further osteo spread throughout the foot that we should proceed with MRI or CT scan. Because the patient does have a BB in the periorbital area of her right I will can actually go over the CT scan. 12-02-2021 upon evaluation today patient appears to be doing well  currently in regard to her wound this is actually measuring smaller today. With that being said we did see signs of improvement in the overall size of the wound though there was bone exposed. I did review her CT scan as well and this showed that there was no evidence of osteomyelitis noted at this point which means what we are seeing on the pathology previous was all superficial. This is actually good news and that I am hopeful that we have actually cleared away any of the necrotic debris that would be problematic going forward. Again however I do believe that these were just small fragments in the bone itself does not appear to be grossly infected which is good news. In the end I think that we are on the right track we does need to try to get this last wound closed everything else is doing awesome. 11/6; small punched out area on the left lateral foot at the level of the base of the fifth metatarsal. Currently using Prisma. This is  not a weightbearing surface According to our intake nurse has been some thoughts of using Grafix however we are still trying to get the prior approval arranged. She does have a history of PAD her most recent ABI was 1.09 Objective Constitutional Vitals Time Taken: 9:09 AM, Height: 64 in, Temperature: 97.8 F, Pulse: 62 bpm, Respiratory Rate: 18 breaths/min, Blood Pressure: 127/68 mmHg. Cardiovascular Dorsalis pedis and posterior tibial pulse on the left are palpable maybe not robust however. Her foot is warm. General Notes: Wound exam; small punched out wound. There is significant depth but no palpable bone today. Under illumination the surface of the wound has slough which I had attempted to wash off. She is far too tender around this to consider debridement today which is unfortunate. No clear evidence of infection Integumentary (Hair, Skin) Wound #2 status is Open. Original cause of wound was Gradually Appeared. The date acquired was: 06/26/2021. The wound has been in  treatment 19 weeks. The wound is located on the Left,Lateral Foot. The wound measures 1cm length x 1cm width x 0.3cm depth; 0.785cm^2 area and 0.236cm^3 volume. There is no tunneling or undermining noted. There is a medium amount of serous drainage noted. The wound margin is flat and intact. There is medium (34-66%) red granulation within the wound bed. There is a medium (34-66%) amount of necrotic tissue within the wound bed including Adherent Slough. Assessment Active Problems ICD-10 Other chronic osteomyelitis, left ankle and foot Other specified peripheral vascular diseases Type 2 diabetes mellitus with foot ulcer Non-pressure chronic ulcer of other part of left foot with fat layer exposed Non-pressure chronic ulcer of other part of left lower leg with fat layer exposed Non-pressure chronic ulcer of left heel and midfoot with fat layer exposed Vascular dementia, unspecified severity, without behavioral disturbance, psychotic disturbance, mood disturbance, and anxiety Plan Follow-up Appointments: Return Appointment in 1 week. Nurse Visit as needed Bathing/ Shower/ Hygiene: Wash wounds with antibacterial soap and water. - dial soap recommended DEVAEH, AMADI ([id number]) [id number] Page 6 of 7 May shower; gently cleanse wound with antibacterial soap, rinse and pat dry prior to dressing wounds No tub bath. Anesthetic (Use 'Patient Medications' Section for Anesthetic Order Entry): Lidocaine applied to wound bed Edema Control - Lymphedema / Segmental Compressive Device / Other: Elevate, Exercise Daily and Avoid Standing for Long Periods of Time. Elevate leg(s) parallel to the floor when sitting. DO YOUR BEST to sleep in the bed at night. DO NOT sleep in your recliner. Long hours of sitting in a recliner leads to swelling of the legs and/or potential wounds on your backside. Off-Loading: Turn and reposition every 2 hours Other: - keep heels off bed when  sleeping, do best to keep pressure off heels Additional Orders / Instructions: Other: - waiting for PA for Grafix PL Prime WOUND #2: - Foot Wound Laterality: Left, Lateral Cleanser: Soap and Water 1 x Per Day/30 Days Discharge Instructions: Gently cleanse wound with antibacterial soap, rinse and pat dry prior to dressing wounds Prim Dressing: Prisma 4.34 (in) (Generic) 1 x Per Day/30 Days ary Discharge Instructions: Moisten w/normal saline or sterile water; Cover wound as directed. Do not remove from wound bed. Secondary Dressing: ABD Pad 5x9 (in/in) (Generic) 1 x Per Day/30 Days Discharge Instructions: Cover with ABD pad Secured With: Medipore T - 21M Medipore H Soft Cloth Surgical T ape ape, 2x2 (in/yd) (Generic) 1 x Per Day/30 Days Secured With: Kerlix Roll Sterile or Non-Sterile 6-ply 4.5x4 (yd/yd) (Generic) 1 x Per Day/30 Days  Discharge Instructions: Apply Kerlix as directed Secured With: Stretch Net Dressing, Latex-free, Size 5, Small-Head / Shoulder / Thigh 1 x Per Day/30 Days Discharge Instructions: size 4 1. Continue with Prisma 2. I agree that a skin substitute to stimulate granulation is probably in order although she would probably need some mechanical debridement before we can do that. She really was too tender today and for me to try to complete this. 3. I agree that a skin substitute such as Grafix might be helpful to stimulate granulation. Recent CT scan was negative, she has been revascularized. Electronic Signature(s) Signed: 12/16/2021 4:14:14 PM By: Baltazar Najjar MD Entered By: Baltazar Najjar on 12/16/2021 09:34:02 -------------------------------------------------------------------------------- SuperBill Details Patient Name: Date of Service: [name], Cinda Quest 12/16/2021 Medical Record Number: [id number] Patient Account Number: [id number] Date of Birth/Sex: Treating RN: 10/30/1933 (88 y.o. [name], [name] Primary Care Provider: [name] Other Clinician:  Betha Loa Referring Provider: Treating Provider/Extender: Chauncey Mann, MICHA EL Chauncy Passy in Treatment: 19 Diagnosis Coding ICD-10 Codes Code Description [phone] Other chronic osteomyelitis, left ankle and foot I73.89 Other specified peripheral vascular diseases E11.621 Type 2 diabetes mellitus with foot ulcer L97.522 Non-pressure chronic ulcer of other part of left foot with fat layer exposed L97.822 Non-pressure chronic ulcer of other part of left lower leg with fat layer exposed L97.422 Non-pressure chronic ulcer of left heel and midfoot with fat layer exposed F01.50 Vascular dementia, unspecified severity, without behavioral disturbance, psychotic disturbance, mood disturbance, and anxiety Facility Procedures : KAILYNN, SATTERLY Code: [id number] BIRDELLA SIPPEL ([id number]) Description: [phone] - WOUND CARE VISIT-LEV 3 EST PT [phone] Modifier: [id number] Quantity: 1 df Page 7 of 7 Physician Procedures : CPT4 Code Description Modifier [id number] - WC PHYS LEVEL 3 - EST PT ICD-10 Diagnosis Description E11.621 Type 2 diabetes mellitus with 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

## 2021-12-18 NOTE — Progress Notes (Signed)
[name], [name] ([id number]) [id number] Page 1 of 9 Visit Report for 12/16/2021 Arrival Information Details Patient Name: Date of Service: [name] 12/16/2021 9:00 A M Medical Record Number: [id number] Patient Account Number: [id number] Date of Birth/Sex: Treating RN: 1933/12/09 (88 y.o. [name], [name] Primary Care Daison Braxton: [name] Other Clinician: Betha Loa Referring Roe Koffman: Treating Selicia Windom/Extender: Chauncey Mann, MICHA EL Chauncy Passy in Treatment: 19 Visit Information History Since Last Visit All ordered tests and consults were completed: No Patient Arrived: Wheel Chair Added or deleted any medications: No Arrival Time: 09:03 Any new allergies or adverse reactions: No Transfer Assistance: Other Had a fall or experienced change in No Patient Requires Transmission-Based Precautions: No activities of daily living that may affect Patient Has Alerts: Yes risk of falls: Patient Alerts: Patient on Blood Thinner Hospitalized since last visit: No ABI 07/11/21 R 1.11 TBI 0.3 Pain Present Now: Yes ABI 07/11/21 L 1.09 TBI 0.3 Notes son transfers patient to chair Electronic Signature(s) Signed: 12/17/2021 5:15:48 PM By: Betha Loa Entered By: Betha Loa on 12/16/2021 09:07:57 -------------------------------------------------------------------------------- Clinic Level of Care Assessment Details Patient Name: Date of Service: [name] 12/16/2021 9:00 A M Medical Record Number: [id number] Patient Account Number: [id number] Date of Birth/Sex: Treating RN: Jun 29, 1933 (88 y.o. [name], [name] Primary Care Aujanae Mccullum: [name] Other Clinician: Betha Loa Referring Alveria Mcglaughlin: Treating Khalie Wince/Extender: RO BSO N, MICHA EL Chauncy Passy in Treatment: 19 Clinic Level of Care Assessment Items TOOL 4 Quantity Score []  - 0 Use when only an EandM is performed on FOLLOW-UP visit ASSESSMENTS - Nursing  Assessment / Reassessment X- 1 10 Reassessment of Co-morbidities (includes updates in patient status) X- 1 5 Reassessment of Adherence to Treatment Plan ASSESSMENTS - Wound and Skin Assessment / Reassessment [name], [name] ([name]) [id number] Page 2 of 9 X- 1 5 Simple Wound Assessment / Reassessment - one wound []  - 0 Complex Wound Assessment / Reassessment - multiple wounds []  - 0 Dermatologic / Skin Assessment (not related to wound area) ASSESSMENTS - Focused Assessment []  - 0 Circumferential Edema Measurements - multi extremities []  - 0 Nutritional Assessment / Counseling / Intervention []  - 0 Lower Extremity Assessment (monofilament, tuning fork, pulses) []  - 0 Peripheral Arterial Disease Assessment (using hand held doppler) ASSESSMENTS - Ostomy and/or Continence Assessment and Care []  - 0 Incontinence Assessment and Management []  - 0 Ostomy Care Assessment and Management (repouching, etc.) PROCESS - Coordination of Care X - Simple Patient / Family Education for ongoing care 1 15 []  - 0 Complex (extensive) Patient / Family Education for ongoing care []  - 0 Staff obtains 02-22-2004, Records, T Results / Process Orders est []  - 0 Staff telephones HHA, Nursing Homes / Clarify orders / etc []  - 0 Routine Transfer to another Facility (non-emergent condition) []  - 0 Routine Hospital Admission (non-emergent condition) []  - 0 New Admissions / / Ordering NPWT Apligraf, etc. , []  - 0 Emergency Hospital Admission (emergent condition) X- 1 10 Simple Discharge Coordination []  - 0 Complex (extensive) Discharge Coordination PROCESS - Special Needs []  - 0 Pediatric / Minor Patient Management []  - 0 Isolation Patient Management []  - 0 Hearing / Language / Visual special needs []  - 0 Assessment of Community assistance (transportation, D/C planning, etc.) []  - 0 Additional assistance / Altered mentation []  -  0 Support Surface(s) Assessment (bed, cushion, seat, etc.) INTERVENTIONS - Wound Cleansing / Measurement X - Simple Wound Cleansing - one  wound 1 5 []  - 0 Complex Wound Cleansing - multiple wounds X- 1 5 Wound Imaging (photographs - any number of wounds) []  - 0 Wound Tracing (instead of photographs) X- 1 5 Simple Wound Measurement - one wound []  - 0 Complex Wound Measurement - multiple wounds INTERVENTIONS - Wound Dressings []  - 0 Small Wound Dressing one or multiple wounds X- 1 15 Medium Wound Dressing one or multiple wounds []  - 0 Large Wound Dressing one or multiple wounds []  - 0 Application of Medications - topical []  - 0 Application of Medications - injection INTERVENTIONS - Miscellaneous []  - 0 External ear exam []  - 0 Specimen Collection (cultures, biopsies, blood, body fluids, etc.) [name], [name] ( ) [id number] Page 3 of 9 []  - 0 Specimen(s) / Culture(s) sent or taken to Lab for analysis []  - 0 Patient Transfer (multiple staff / Lift / Similar devices) []  - 0 Simple Staple / Suture removal (25 or less) []  - 0 Complex Staple / Suture removal (26 or more) []  - 0 Hypo / Hyperglycemic Management (close monitor of Blood Glucose) []  - 0 Ankle / Brachial Index (ABI) - do not check if billed separately X- 1 5 Vital Signs Has the patient been seen at the hospital within the last three years: Yes Total Score: 80 Level Of Care: New/Established - Level 3 Electronic Signature(s) Signed: 12/17/2021 5:15:48 PM By: Entered By: on 12/16/2021 09:27:50 -------------------------------------------------------------------------------- Encounter Discharge Information Details Patient Name: Date of Service: PA [name], . 12/16/2021 9:00 A M Medical Record Number: [id number] Patient Account Number: 02-22-2004 Date of Birth/Sex: Treating RN: Dec 17, 1933 (88 y.o. [name], [name] Primary Care Tyce Delcid:  Other Clinician: Referring Sheccid Lahmann: Treating Joakim Huesman/Extender: RO BSO N, MICHA EL in Treatment: 19 Encounter Discharge Information Items Discharge Condition: Stable Ambulatory Status: Wheelchair Discharge Destination: Home Transportation: Private Auto Accompanied By: son Schedule Follow-up Appointment: Yes Clinical Summary of Care: Electronic Signature(s) Signed: 12/17/2021 5:15:48 PM By: Betha Loa Entered By: Betha Loa on 12/16/2021 09:36:16 -------------------------------------------------------------------------------- Lower Extremity Assessment Details Patient Name: Date of Service: PA [name], [name] LLIE [name]. 12/16/2021 9:00 A M Medical Record Number: 13/07/2021 Patient Account Number: [id number] [name], [name] (14/11/1933) [id number] Page 4 of 9 Date of Birth/Sex: Treating RN: Jun 03, 1933 (88 y.o. Betha Loa Primary Care Sherene Plancarte: Other Clinician: Chauncy Passy Referring Charliee Krenz: Treating Equilla Que/Extender: RO BSO 20, MICHA EL 13/08/2021 in Treatment: 19 Electronic Signature(s) Signed: 12/17/2021 8:23:09 AM By: 13/07/2021 BSN, RN, CWS, [name] RN, BSN Signed: 12/17/2021 5:15:48 PM By: [id number] Entered By: [id number] on 12/16/2021 09:21:27 -------------------------------------------------------------------------------- Multi Wound Chart Details Patient Name: Date of Service: PA [name], [name] LLIE [name]. 12/16/2021 9:00 A M Medical Record Number: [id number] Patient Account Number: 14/11/1933 Date of Birth/Sex: Treating RN: [birth year] (88 y.o. [name], [name] Primary Care Sriram Febles: Dorris Carnes Other Clinician: Chauncy Passy Referring Tonna Palazzi: Treating Alexxia Stankiewicz/Extender: RO BSO N, MICHA EL 20, John Weeks in Treatment: 19 Vital Signs Height(in): 64 Pulse(bpm): 62 Weight(lbs): Blood Pressure(mmHg): 127/68 Body Mass Index(BMI): Temperature(F):  97.8 Respiratory Rate(breaths/min): 18 [2:Photos:] [N/A:N/A] Left, Lateral Foot N/A N/A Wound Location: Gradually Appeared N/A N/A Wounding Event: Diabetic Wound/Ulcer of the Lower N/A N/A Primary Etiology: Extremity Hypertension, Type II Diabetes, N/A N/A Comorbid History: Osteoarthritis, Neuropathy 06/26/2021 N/A N/A Date Acquired: 15 N/A N/A Weeks of Treatment: Open N/A N/A Wound Status: No N/A N/A Wound Recurrence: 1x1x0.3 N/A N/A Measurements L x W  x D (cm) 0.785 N/A N/A A (cm) : rea 0.236 N/A N/A Volume (cm) : 63.00% N/A N/A % Reduction in A rea: -11.30% N/A N/A % Reduction in Volume: Grade 3 N/A N/A Classification: Medium N/A N/A Exudate A mount: Serous N/A N/A Exudate Type: amber N/A N/A Exudate Color: Flat and Intact N/A N/A Wound Margin: Medium (34-66%) N/A N/A Granulation A mount: Red N/A N/A Granulation Quality: Medium (34-66%) N/A N/A Necrotic A mount: Fascia: No N/A N/A Exposed Structures: Fat Layer (Subcutaneous Tissue): No Tendon: No [name], [name] ([id number]) [id number] Page 5 of 9 Muscle: No Joint: No Bone: No None N/A N/A Epithelialization: Treatment Notes Electronic Signature(s) Signed: 12/17/2021 5:15:48 PM By: Betha Loa Entered By: Betha Loa on 12/16/2021 09:21:41 -------------------------------------------------------------------------------- Multi-Disciplinary Care Plan Details Patient Name: Date of Service: PA [name], [name] 12/16/2021 9:00 A M Medical Record Number: [id number] Patient Account Number: [id number] Date of Birth/Sex: Treating RN: 11-20-1933 (88 y.o. [name], [name] Primary Care Ilze Roselli: [name] Other Clinician: Betha Loa Referring Stehanie Ekstrom: Treating Ethanjames Fontenot/Extender: RO BSO N, MICHA EL Elveria Rising, John Weeks in Treatment: 19 Active Inactive Wound/Skin Impairment Nursing Diagnoses: Impaired tissue integrity Knowledge deficit related to  ulceration/compromised skin integrity Goals: Ulcer/skin breakdown will have a volume reduction of 30% by week 4 Date Initiated: 08/01/2021 Date Inactivated: 11/18/2021 Target Resolution Date: 10/10/2021 Goal Status: Unmet Unmet Reason: comordbitis Ulcer/skin breakdown will have a volume reduction of 50% by week 8 Date Initiated: 08/01/2021 Date Inactivated: 11/18/2021 Target Resolution Date: 10/12/2021 Goal Status: Unmet Unmet Reason: comordbitis Ulcer/skin breakdown will have a volume reduction of 80% by week 12 Date Initiated: 08/01/2021 Date Inactivated: 11/18/2021 Target Resolution Date: 10/24/2021 Goal Status: Unmet Unmet Reason: comordbities Ulcer/skin breakdown will heal within 14 weeks Date Initiated: 08/01/2021 Target Resolution Date: 12/07/2021 Goal Status: Active Interventions: Assess patient/caregiver ability to obtain necessary supplies Assess patient/caregiver ability to perform ulcer/skin care regimen upon admission and as needed Assess ulceration(s) every visit Provide education on ulcer and skin care Notes: Electronic Signature(s) Signed: 12/17/2021 8:23:09 AM By: Elliot Gurney, BSN, RN, CWS, [name] RN, BSN Signed: 12/17/2021 5:15:48 PM By: Betha Loa Entered By: Betha Loa on 12/16/2021 09:21:30 Commerford, Gabriela Eves ([id number]) [id number] Page 6 of 9 -------------------------------------------------------------------------------- Pain Assessment Details Patient Name: Date of Service: [name] 12/16/2021 9:00 A M Medical Record Number: [id number] Patient Account Number: [id number] Date of Birth/Sex: Treating RN: [birth year] (88 y.o. [name] Primary Care Ayman Brull: [name] Other Clinician: Betha Loa Referring [name]: Treating Makeisha Jentsch/Extender: RO BSO N, MICHA EL Chauncy Passy in Treatment: 19 Active Problems Location of Pain Severity and Description of Pain Patient Has Paino Yes Site Locations Pain  Location: Pain in Ulcers Rate the pain. Current Pain Level: 1 Worst Pain Level: 9 Least Pain Level: 1 Tolerable Pain Level: 3 Character of Pain Describe the Pain: Shooting, Stabbing Pain Management and Medication Current Pain Management: Medication: Yes Cold Application: No Rest: Yes Massage: No Activity: No T.E.N.S.: No Heat Application: No Leg drop or elevation: No Is the Current Pain Management Adequate: Inadequate How does your wound impact your activities of daily livingo Sleep: Yes Bathing: No Appetite: No Relationship With Others: No Bladder Continence: No Emotions: No Bowel Continence: No Work: No Toileting: No Drive: No Dressing: No Hobbies: No Psychologist, prison and probation services) Signed: 12/17/2021 8:23:09 AM By: Elliot Gurney, BSN, RN, CWS, [name] RN, BSN Signed: 12/17/2021 5:15:48 PM By: Betha Loa Entered By: Betha Loa on 12/16/2021 09:19:26 Plotz, Gabriela Eves ([id number]) [id number] Page 7 of  9 -------------------------------------------------------------------------------- Patient/Caregiver Education Details Patient Name: Date of Service: [name] 11/6/2023andnbsp9:00 A M Medical Record Number: [id number] Patient Account Number: [id number] Date of Birth/Gender: Treating RN: 18-Jan-1934 (88 y.o. [name] Primary Care Physician: [name] Other Clinician: Betha Loa Referring Physician: Treating Physician/Extender: RO BSO N, MICHA EL Chauncy Passy in Treatment: 19 Education Assessment Education Provided To: Patient Education Topics Provided Wound/Skin Impairment: Handouts: Other: continue wound care as directed Methods: Explain/Verbal Responses: State content correctly Electronic Signature(s) Signed: 12/17/2021 5:15:48 PM By: Betha Loa Entered By: Betha Loa on 12/16/2021 09:28:16 -------------------------------------------------------------------------------- Wound Assessment  Details Patient Name: Date of Service: PA [name], [name] LLIE [name]. 12/16/2021 9:00 A M Medical Record Number: [id number] Patient Account Number: [id number] Date of Birth/Sex: Treating RN: 03/12/1933 (88 y.o. [name], [name] Primary Care Lain Tetterton: [name] Other Clinician: Betha Loa Referring Delynda Sepulveda: Treating Roderick Sweezy/Extender: RO BSO N, MICHA EL Elveria Rising, John Weeks in Treatment: 19 Wound Status Wound Number: 2 Primary Etiology: Diabetic Wound/Ulcer of the Lower Extremity Wound Location: Left, Lateral Foot Wound Status: Open Wounding Event: Gradually Appeared Comorbid History: Hypertension, Type II Diabetes, Osteoarthritis, Neuropathy Date Acquired: 06/26/2021 Weeks Of Treatment: 19 Clustered Wound: No Photos AMAND, LEMOINE ([id number]) [id number] Page 8 of 9 Wound Measurements Length: (cm) 1 Width: (cm) 1 Depth: (cm) 0.3 Area: (cm) 0.785 Volume: (cm) 0.236 % Reduction in Area: 63% % Reduction in Volume: -11.3% Epithelialization: None Tunneling: No Undermining: No Wound Description Classification: Grade 3 Wound Margin: Flat and Intact Exudate Amount: Medium Exudate Type: Serous Exudate Color: amber Foul Odor After Cleansing: No Slough/Fibrino Yes Wound Bed Granulation Amount: Medium (34-66%) Exposed Structure Granulation Quality: Red Fascia Exposed: No Necrotic Amount: Medium (34-66%) Fat Layer (Subcutaneous Tissue) Exposed: No Necrotic Quality: Adherent Slough Tendon Exposed: No Muscle Exposed: No Joint Exposed: No Bone Exposed: No Treatment Notes Wound #2 (Foot) Wound Laterality: Left, Lateral Cleanser Soap and Water Discharge Instruction: Gently cleanse wound with antibacterial soap, rinse and pat dry prior to dressing wounds Peri-Wound Care Topical Primary Dressing Prisma 4.34 (in) Discharge Instruction: Moisten w/normal saline or sterile water; Cover wound as directed. Do not remove from wound bed. Secondary  Dressing ABD Pad 5x9 (in/in) Discharge Instruction: Cover with ABD pad Secured With Medipore T - 22M Medipore H Soft Cloth Surgical T ape ape, 2x2 (in/yd) Kerlix Roll Sterile or Non-Sterile 6-ply 4.5x4 (yd/yd) Discharge Instruction: Apply Kerlix as directed Stretch Net Dressing, Latex-free, Size 5, Small-Head / Shoulder / Thigh Discharge Instruction: size 4 Compression Wrap Compression Stockings Add-Ons Electronic Signature(s) Signed: 12/17/2021 8:23:09 AM By: Elliot Gurney, BSN, RN, CWS, [name] RN, BSN Signed: 12/17/2021 5:15:48 PM By: Betha Loa Entered By: Betha Loa on 12/16/2021 09:20:03 Tracey, Gabriela Eves ([id number]) [id number] Page 9 of 9 -------------------------------------------------------------------------------- Vitals Details Patient Name: Date of Service: [name] 12/16/2021 9:00 A M Medical Record Number: [id number] Patient Account Number: [id number] Date of Birth/Sex: Treating RN: 1933/09/30 (88 y.o. [name], [name] Primary Care Landra Howze: [name] Other Clinician: Betha Loa Referring Cleatus Gabriel: Treating Jamesha Ellsworth/Extender: RO BSO N, MICHA EL Elveria Rising, John Weeks in Treatment: 19 Vital Signs Time Taken: 09:09 Temperature (F): 97.8 Height (in): 64 Pulse (bpm): 62 Respiratory Rate (breaths/min): 18 Blood Pressure (mmHg): 127/68 Reference Range: 80 - 120 mg / dl Electronic Signature(s) Signed: 12/17/2021 5:15:48 PM By: Betha Loa Entered By: Betha Loa on 12/16/2021 09:19:21

## 2021-12-30 ENCOUNTER — Encounter: Payer: Medicare HMO | Admitting: Physician Assistant

## 2021-12-30 DIAGNOSIS — E1169 Type 2 diabetes mellitus with other specified complication: Secondary | ICD-10-CM | POA: Diagnosis not present

## 2021-12-30 DIAGNOSIS — L97422 Non-pressure chronic ulcer of left heel and midfoot with fat layer exposed: Secondary | ICD-10-CM | POA: Diagnosis not present

## 2021-12-30 DIAGNOSIS — E11621 Type 2 diabetes mellitus with foot ulcer: Secondary | ICD-10-CM | POA: Diagnosis not present

## 2021-12-30 DIAGNOSIS — F0150 Vascular dementia without behavioral disturbance: Secondary | ICD-10-CM | POA: Diagnosis not present

## 2021-12-30 DIAGNOSIS — L97522 Non-pressure chronic ulcer of other part of left foot with fat layer exposed: Secondary | ICD-10-CM | POA: Diagnosis not present

## 2021-12-30 DIAGNOSIS — L97524 Non-pressure chronic ulcer of other part of left foot with necrosis of bone: Secondary | ICD-10-CM | POA: Diagnosis not present

## 2021-12-30 DIAGNOSIS — E11622 Type 2 diabetes mellitus with other skin ulcer: Secondary | ICD-10-CM | POA: Diagnosis not present

## 2021-12-30 DIAGNOSIS — L97822 Non-pressure chronic ulcer of other part of left lower leg with fat layer exposed: Secondary | ICD-10-CM | POA: Diagnosis not present

## 2021-12-30 DIAGNOSIS — M86672 Other chronic osteomyelitis, left ankle and foot: Secondary | ICD-10-CM | POA: Diagnosis not present

## 2021-12-30 NOTE — Progress Notes (Addendum)
[name], [name] ([id number]) [id number] Page 1 of 9 Visit Report for 12/30/2021 Arrival Information Details Patient Name: Date of Service: [name] 12/30/2021 9:00 A M Medical Record Number: [id number] Patient Account Number: [id number] Date of Birth/Sex: Treating RN: [birth year] (88 y.o. [name], [name] Primary Care Susano Cleckler: [name] Other Clinician: Betha Loa Referring Dontai Pember: Treating Jeralyn Nolden/Extender: Joylene Grapes in Treatment: 21 Visit Information History Since Last Visit All ordered tests and consults were completed: No Patient Arrived: Ambulatory Added or deleted any medications: No Arrival Time: 09:25 Any new allergies or adverse reactions: No Transfer Assistance: None Had a fall or experienced change in No Patient Requires Transmission-Based Precautions: No activities of daily living that may affect Patient Has Alerts: Yes risk of falls: Patient Alerts: Patient on Blood Thinner Signs or symptoms of abuse/neglect since last visito No ABI 07/11/21 R 1.11 TBI 0.3 Hospitalized since last visit: No ABI 07/11/21 L 1.09 TBI 0.3 Implantable device outside of the clinic excluding No cellular tissue based products placed in the center since last visit: Pain Present Now: Yes Electronic Signature(s) Signed: 12/31/2021 5:00:42 PM By: Betha Loa Entered By: Betha Loa on 12/30/2021 09:26:01 -------------------------------------------------------------------------------- Clinic Level of Care Assessment Details Patient Name: Date of Service: [name] 12/30/2021 9:00 A M Medical Record Number: [id number] Patient Account Number: [id number] Date of Birth/Sex: Treating RN: July 17, 1933 (88 y.o. [name] Primary Care Deedra Pro: [name] Other Clinician: Betha Loa Referring Ellizabeth Dacruz: Treating Moyinoluwa Dawe/Extender: Joylene Grapes in Treatment: 21 Clinic Level of Care  Assessment Items TOOL 1 Quantity Score []  - 0 Use when EandM and Procedure is performed on INITIAL visit ASSESSMENTS - Nursing Assessment / Reassessment []  - 0 General Physical Exam (combine w/ comprehensive assessment (listed just below) when performed on new pt. evals) []  - 0 Comprehensive Assessment (HX, ROS, Risk Assessments, Wounds Hx, etc.) [name], [name] ( ) [id number] Page 2 of 9 ASSESSMENTS - Wound and Skin Assessment / Reassessment []  - 0 Dermatologic / Skin Assessment (not related to wound area) ASSESSMENTS - Ostomy and/or Continence Assessment and Care []  - 0 Incontinence Assessment and Management []  - 0 Ostomy Care Assessment and Management (repouching, etc.) PROCESS - Coordination of Care []  - 0 Simple Patient / Family Education for ongoing care []  - 0 Complex (extensive) Patient / Family Education for ongoing care []  - 0 Staff obtains , Records, T Results / Process Orders est []  - 0 Staff telephones HHA, Nursing Homes / Clarify orders / etc []  - 0 Routine Transfer to another Facility (non-emergent condition) []  - 0 Routine Hospital Admission (non-emergent condition) []  - 0 New Admissions / [id number] / Ordering NPWT Apligraf, etc. , []  - 0 Emergency Hospital Admission (emergent condition) PROCESS - Special Needs []  - 0 Pediatric / Minor Patient Management []  - 0 Isolation Patient Management []  - 0 Hearing / Language / Visual special needs []  - 0 Assessment of Community assistance (transportation, D/C planning, etc.) []  - 0 Additional assistance / Altered mentation []  - 0 Support Surface(s) Assessment (bed, cushion, seat, etc.) INTERVENTIONS - Miscellaneous []  - 0 External ear exam []  - 0 Patient Transfer (multiple staff / 04-02-2001 / Similar devices) []  - 0 Simple Staple / Suture removal (25 or less) []  - 0 Complex Staple / Suture removal (26 or more) []  - 0 Hypo/Hyperglycemic  Management (do not check if billed separately) []  - 0 Ankle / Brachial Index (ABI) - do not check if  billed separately Has the patient been seen at the hospital within the last three years: Yes Total Score: 0 Level Of Care: ____ Electronic Signature(s) Signed: 12/31/2021 5:00:42 PM By: Betha Loa Entered By: Betha Loa on 12/30/2021 09:59:25 -------------------------------------------------------------------------------- Encounter Discharge Information Details Patient Name: Date of Service: [name], [name] 12/30/2021 9:00 A M Medical Record Number: [id number] Patient Account Number: [id number] Date of Birth/Sex: Treating RN: [birth year] (88 y.o. [name], [name] Primary Care Riann Oman: [name] Other Clinician: Betha Loa Referring Karley Pho: Treating Valinda Fedie/Extender: Joylene Grapes in Treatment: [address], [name] ([id number]) [id number] Page 3 of 9 Encounter Discharge Information Items Post Procedure Vitals Discharge Condition: Stable Temperature (F): 97.5 Ambulatory Status: Wheelchair Pulse (bpm): 123 Discharge Destination: Home Respiratory Rate (breaths/min): 18 Transportation: Private Auto Blood Pressure (mmHg): 155/93 Accompanied By: son Schedule Follow-up Appointment: Yes Clinical Summary of Care: Electronic Signature(s) Signed: 12/31/2021 5:00:42 PM By: Betha Loa Entered By: Betha Loa on 12/30/2021 10:00:58 -------------------------------------------------------------------------------- Lower Extremity Assessment Details Patient Name: Date of Service: [name], [name] [name]. 12/30/2021 9:00 A M Medical Record Number: [id number] Patient Account Number: [id number] Date of Birth/Sex: Treating RN: 09/18/1933 (88 y.o. [name], [name] Primary Care Aniah Pauli: [name] Other Clinician: Betha Loa Referring Lino Wickliff: Treating Porche Steinberger/Extender: Joylene Grapes in Treatment:  21 Vascular Assessment Pulses: Dorsalis Pedis Palpable: [Left:Yes] Electronic Signature(s) Signed: 12/30/2021 1:46:28 PM By: Elliot Gurney BSN, RN, CWS, [name] RN, BSN Signed: 12/31/2021 5:00:42 PM By: Betha Loa Entered By: Betha Loa on 12/30/2021 09:38:30 -------------------------------------------------------------------------------- Multi Wound Chart Details Patient Name: Date of Service: [name], [name] 12/30/2021 9:00 A M Medical Record Number: [id number] Patient Account Number: [id number] Date of Birth/Sex: Treating RN: 1933/11/21 (88 y.o. [name] Primary Care Roniel Halloran: [name] Other Clinician: Betha Loa Referring Weylyn Ricciuti: Treating Unnamed Hino/Extender: Joylene Grapes in Treatment: 21 Vital Signs Height(in): 64 Pulse(bpm): 123 Weight(lbs): Blood Pressure(mmHg): 155/93 MADOLINE, BHATT ([id number]) [id number] Page 4 of 9 Body Mass Index(BMI): Temperature(F): 97.5 Respiratory Rate(breaths/min): 16 [2:Photos:] [N/A:N/A] Left, Lateral Foot N/A N/A Wound Location: Gradually Appeared N/A N/A Wounding Event: Diabetic Wound/Ulcer of the Lower N/A N/A Primary Etiology: Extremity Hypertension, Type II Diabetes, N/A N/A Comorbid History: Osteoarthritis, Neuropathy 06/26/2021 N/A N/A Date Acquired: 21 N/A N/A Weeks of Treatment: Open N/A N/A Wound Status: No N/A N/A Wound Recurrence: 0.8x0.6x0.4 N/A N/A Measurements L x W x D (cm) 0.377 N/A N/A A (cm) : rea 0.151 N/A N/A Volume (cm) : 82.20% N/A N/A % Reduction in A rea: 28.80% N/A N/A % Reduction in Volume: Grade 3 N/A N/A Classification: Medium N/A N/A Exudate A mount: Serous N/A N/A Exudate Type: amber N/A N/A Exudate Color: Flat and Intact N/A N/A Wound Margin: Medium (34-66%) N/A N/A Granulation A mount: Red N/A N/A Granulation Quality: Medium (34-66%) N/A N/A Necrotic A mount: Fat Layer (Subcutaneous Tissue): Yes N/A  N/A Exposed Structures: Fascia: No Tendon: No Muscle: No Joint: No Bone: No None N/A N/A Epithelialization: Treatment Notes Electronic Signature(s) Signed: 12/31/2021 5:00:42 PM By: Betha Loa Entered By: Betha Loa on 12/30/2021 09:39:26 -------------------------------------------------------------------------------- Multi-Disciplinary Care Plan Details Patient Name: Date of Service: [name], [name] 12/30/2021 9:00 A M Medical Record Number: [id number] Patient Account Number: [id number] Date of Birth/Sex: Treating RN: [birth year] (88 y.o. [name], [name] Primary Care Abu Heavin: [name] Other Clinician: Betha Loa Referring Izell Labat: Treating Naveya Ellerman/Extender: Joylene Grapes in Treatment: [address], [name] ([id number]) [id number] Page 5 of  9 Wound/Skin Impairment Nursing Diagnoses: Impaired tissue integrity Knowledge deficit related to ulceration/compromised skin integrity Goals: Ulcer/skin breakdown will have a volume reduction of 30% by week 4 Date Initiated: 08/01/2021 Date Inactivated: 11/18/2021 Target Resolution Date: 10/10/2021 Goal Status: Unmet Unmet Reason: comordbitis Ulcer/skin breakdown will have a volume reduction of 50% by week 8 Date Initiated: 08/01/2021 Date Inactivated: 11/18/2021 Target Resolution Date: 10/12/2021 Goal Status: Unmet Unmet Reason: comordbitis Ulcer/skin breakdown will have a volume reduction of 80% by week 12 Date Initiated: 08/01/2021 Date Inactivated: 11/18/2021 Target Resolution Date: 10/24/2021 Goal Status: Unmet Unmet Reason: comordbities Ulcer/skin breakdown will heal within 14 weeks Date Initiated: 08/01/2021 Target Resolution Date: 12/07/2021 Goal Status: Active Interventions: Assess patient/caregiver ability to obtain necessary supplies Assess patient/caregiver ability to perform ulcer/skin care regimen upon admission and as needed Assess  ulceration(s) every visit Provide education on ulcer and skin care Notes: Electronic Signature(s) Signed: 12/30/2021 1:46:28 PM By: Elliot Gurney, BSN, RN, CWS, [name] RN, BSN Signed: 12/31/2021 5:00:42 PM By: Betha Loa Entered By: Betha Loa on 12/30/2021 09:38:35 -------------------------------------------------------------------------------- Pain Assessment Details Patient Name: Date of Service: [name], [name] 12/30/2021 9:00 A M Medical Record Number: [id number] Patient Account Number: [id number] Date of Birth/Sex: Treating RN: Nov 16, 1933 (88 y.o. [name] Primary Care Princessa Lesmeister: [name] Other Clinician: Betha Loa Referring Le Ferraz: Treating Gregrey Bloyd/Extender: Joylene Grapes in Treatment: 21 Active Problems Location of Pain Severity and Description of Pain Patient Has Paino Yes Site Locations Pain Location: LONNETTE, SHRODE ([id number]) [id number] Page 6 of 9 Pain Location: Pain in Ulcers Duration of the Pain. Constant / Intermittento Constant Rate the pain. Current Pain Level: 7 Character of Pain Describe the Pain: Other: "it just hurts so bad" Pain Management and Medication Current Pain Management: Medication: Yes Cold Application: No Rest: No Massage: No Activity: No T.E.N.S.: No Heat Application: No Leg drop or elevation: No Is the Current Pain Management Adequate: Inadequate How does your wound impact your activities of daily livingo Sleep: Yes Bathing: No Appetite: No Relationship With Others: No Bladder Continence: No Emotions: No Bowel Continence: No Work: No Toileting: No Drive: No Dressing: No Hobbies: No Psychologist, prison and probation services) Signed: 12/30/2021 1:46:28 PM By: Elliot Gurney, BSN, RN, CWS, [name] RN, BSN Signed: 12/31/2021 5:00:42 PM By: Betha Loa Entered By: Betha Loa on 12/30/2021  09:37:26 -------------------------------------------------------------------------------- Patient/Caregiver Education Details Patient Name: Date of Service: [name], [name] Quest 11/20/2023andnbsp9:00 A M Medical Record Number: [id number] Patient Account Number: [id number] Date of Birth/Gender: Treating RN: [birth year] (88 y.o. [name] Primary Care Physician: [name] Other Clinician: Betha Loa Referring Physician: Treating Physician/Extender: Joylene Grapes in Treatment: 21 Education Assessment Education Provided To: Patient and Caregiver Education Topics Provided Hyperbaric Oxygenation: Handouts: Hyperbaric Oxygen, Other: information given [name], [name] ([id number]) [id number] Page 7 of 9 Methods: Explain/Verbal Responses: State content correctly Wound/Skin Impairment: Handouts: Other: continue wound care as directed Methods: Explain/Verbal Responses: State content correctly Electronic Signature(s) Signed: 12/31/2021 5:00:42 PM By: Betha Loa Entered By: Betha Loa on 12/30/2021 10:00:09 -------------------------------------------------------------------------------- Wound Assessment Details Patient Name: Date of Service: [name], [name] [name]. 12/30/2021 9:00 A M Medical Record Number: [id number] Patient Account Number: [id number] Date of Birth/Sex: Treating RN: September 07, 1933 (88 y.o. [name] Primary Care Selmer Adduci: [name] Other Clinician: Betha Loa Referring Treven Holtman: Treating Gabriellia Rempel/Extender: Joylene Grapes in Treatment: 21 Wound Status Wound Number: 2 Primary Etiology: Diabetic Wound/Ulcer of the Lower Extremity Wound Location: Left, Lateral Foot Wound Status: Open Wounding  Event: Gradually Appeared Comorbid History: Hypertension, Type II Diabetes, Osteoarthritis, Neuropathy Date Acquired: 06/26/2021 Weeks Of Treatment: 21 Clustered Wound: No Photos Wound  Measurements Length: (cm) 0.8 Width: (cm) 0.6 Depth: (cm) 0.4 Area: (cm) 0.377 Volume: (cm) 0.151 % Reduction in Area: 82.2% % Reduction in Volume: 28.8% Epithelialization: None Tunneling: No Undermining: No Wound Description Classification: Grade 3 Wound Margin: Flat and Intact Exudate Amount: Medium Exudate Type: Serous Exudate Color: amber Foul Odor After Cleansing: No Slough/Fibrino Yes Wound Bed Buehler, [name] EvesHALLIE [name] ([id number]) [id number] Page 8 of 9 Granulation Amount: Medium (34-66%) Exposed Structure Granulation Quality: Red Fascia Exposed: No Necrotic Amount: Medium (34-66%) Fat Layer (Subcutaneous Tissue) Exposed: Yes Necrotic Quality: Adherent Slough Tendon Exposed: No Muscle Exposed: No Joint Exposed: No Bone Exposed: No Treatment Notes Wound #2 (Foot) Wound Laterality: Left, Lateral Cleanser Soap and Water Discharge Instruction: Gently cleanse wound with antibacterial soap, rinse and pat dry prior to dressing wounds Peri-Wound Care Topical Primary Dressing Prisma 4.34 (in) Discharge Instruction: Moisten w/normal saline or sterile water; Cover wound as directed. Do not remove from wound bed. Secondary Dressing ABD Pad 5x9 (in/in) Discharge Instruction: Cover with ABD pad Secured With Medipore T - 31M Medipore H Soft Cloth Surgical T ape ape, 2x2 (in/yd) Kerlix Roll Sterile or Non-Sterile 6-ply 4.5x4 (yd/yd) Discharge Instruction: Apply Kerlix as directed Stretch Net Dressing, Latex-free, Size 5, Small-Head / Shoulder / Thigh Discharge Instruction: size 4 Compression Wrap Compression Stockings Add-Ons Electronic Signature(s) Signed: 12/30/2021 1:46:28 PM By: Elliot GurneyWoody, BSN, RN, CWS, [name] RN, BSN Signed: 12/31/2021 5:00:42 PM By: Betha LoaVenable, Angie Entered By: Betha LoaVenable, Angie on 12/30/2021 09:38:06 -------------------------------------------------------------------------------- Vitals Details Patient Name: Date of Service: PA  [name], [name] QuestHA [name]. 12/30/2021 9:00 A M Medical Record Number: [id number] Patient Account Number: [id number] Date of Birth/Sex: Treating RN: Dec 21, 1933 (88 y.o. Cathlean CowerF) Woody, [name] Primary Care Jonluke Cobbins: Marcelino DusterJohnston, John Other Clinician: Betha LoaVenable, Angie Referring Terre Hanneman: Treating Sherle Mello/Extender: Joylene GrapesStone, Hoyt Johnston, John Weeks in Treatment: 21 Vital Signs Time Taken: 09:28 Temperature (F): 97.5 Height (in): 64 Pulse (bpm): 123 Respiratory Rate (breaths/min): 16 Blood Pressure (mmHg): 155/93 Reference Range: 80 - 120 mg / dl Maneri, Emilyn [name] ([id number]) [id number] Page 9 of 9 Electronic Signature(s) Signed: 12/31/2021 5:00:42 PM By: Betha LoaVenable, Angie Entered By: Betha LoaVenable, Angie on 12/30/2021 09:30:34

## 2021-12-30 NOTE — Progress Notes (Signed)
MAECY, PODGURSKI ([id number]) [id number] Page 1 of 2 Visit Report for 12/30/2021 Chief Complaint Document Details Patient Name: Date of Service: [name] 12/30/2021 9:00 A M Medical Record Number: [id number] Patient Account Number: [id number] Date of Birth/Sex: Treating RN: 1933/09/26 (88 y.o. [name], [name] Primary Care Provider: [name] Other Clinician: Betha Loa Referring Provider: Treating Provider/Extender: Joylene Grapes in Treatment: 21 Information Obtained from: Patient Chief Complaint Left foot ulcer with osteomyelitis Electronic Signature(s) Signed: 12/30/2021 9:14:48 AM By: Lenda Kelp PA-C Entered By: Lenda Kelp on 12/30/2021 09:14:48 -------------------------------------------------------------------------------- Problem List Details Patient Name: Date of Service: [name], HA LLIE G. 12/30/2021 9:00 A M Medical Record Number: [id number] Patient Account Number: [id number] Date of Birth/Sex: Treating RN: 1933/07/01 (88 y.o. [name], [name] Primary Care Provider: [name] Other Clinician: Betha Loa Referring Provider: Treating Provider/Extender: Joylene Grapes in Treatment: 21 Active Problems ICD-10 Encounter Code Description Active Date MDM Diagnosis [phone] Other chronic osteomyelitis, left ankle and foot 11/11/2021 No Yes I73.89 Other specified peripheral vascular diseases 08/01/2021 No Yes E11.621 Type 2 diabetes mellitus with foot ulcer 08/01/2021 No Yes Passero, Kourtney G ([id number]) [id number] Page 2 of 2 [phone] Non-pressure chronic ulcer of other part of left foot with fat layer exposed 08/01/2021 No Yes L97.822 Non-pressure chronic ulcer of other part of left lower leg with fat layer exposed6/22/2023 No Yes L97.422 Non-pressure chronic ulcer of left heel and midfoot with fat layer exposed 08/01/2021 No Yes F01.50 Vascular  dementia, unspecified severity, without behavioral disturbance, 08/01/2021 No Yes psychotic disturbance, mood disturbance, and anxiety Inactive Problems Resolved Problems Electronic Signature(s) Signed: 12/30/2021 9:14:44 AM By: Lenda Kelp PA-C Entered By: Lenda Kelp on 12/30/2021 09:14:44

## 2021-12-31 NOTE — Progress Notes (Signed)
[name], [name] ([id number]) [id number] Page 1 of 1 Visit Report for 12/30/2021 HBO Patient Questionnaire Details Patient Name: Date of Service: [name] 12/30/2021 9:00 A M Medical Record Number: [id number] Patient Account Number: [id number] Date of Birth/Sex: Treating RN: [birth year] (88 y.o. [name] Primary Care Josaphine Shimamoto: [name] Other Clinician: Betha Loa Referring Jadda Hunsucker: Treating Chelbie Jarnagin/Extender: Joylene Grapes in Treatment: 21 HBO Patient Questionnaire Items Answer A "Yes" answers must be brought to the hyperbaric physician's attention. ny Breathing or Lung problemso No Currently use tobacco productso No Used tobacco products in the pasto No Heart problemso No Do you take water pills (diuretic)o Yes If yes, last time taken: 12/19/2021 Diabeteso Yes On Diabetes pillo Yes If yes, last time taken: 12/29/2021 On Insulino No Dialysiso No Eye problems like glaucomao No Ear problems or surgeryo No Sinus Problemso No Cancero No Confinement Anxiety (Claustrophobia- fear of confined places)o No Any medical implants/devices that are fully or partially implanted or attached to your bodyo Yes Pregnanto No Seizureso No Electronic Signature(s) Signed: 12/30/2021 1:46:28 PM By: Elliot Gurney, BSN, RN, CWS, [name] RN, BSN Signed: 12/31/2021 5:00:42 PM By: Betha Loa Entered By: Betha Loa on 12/30/2021 10:14:43

## 2022-01-06 ENCOUNTER — Encounter: Payer: Medicare HMO | Admitting: Physician Assistant

## 2022-01-06 DIAGNOSIS — L98499 Non-pressure chronic ulcer of skin of other sites with unspecified severity: Secondary | ICD-10-CM | POA: Diagnosis not present

## 2022-01-06 DIAGNOSIS — Z96653 Presence of artificial knee joint, bilateral: Secondary | ICD-10-CM | POA: Diagnosis present

## 2022-01-06 DIAGNOSIS — L97529 Non-pressure chronic ulcer of other part of left foot with unspecified severity: Secondary | ICD-10-CM | POA: Diagnosis not present

## 2022-01-06 DIAGNOSIS — E871 Hypo-osmolality and hyponatremia: Secondary | ICD-10-CM | POA: Diagnosis present

## 2022-01-06 DIAGNOSIS — G459 Transient cerebral ischemic attack, unspecified: Secondary | ICD-10-CM | POA: Diagnosis not present

## 2022-01-06 DIAGNOSIS — E1169 Type 2 diabetes mellitus with other specified complication: Secondary | ICD-10-CM | POA: Diagnosis present

## 2022-01-06 DIAGNOSIS — E782 Mixed hyperlipidemia: Secondary | ICD-10-CM | POA: Diagnosis present

## 2022-01-06 DIAGNOSIS — E162 Hypoglycemia, unspecified: Secondary | ICD-10-CM | POA: Diagnosis not present

## 2022-01-06 DIAGNOSIS — M1990 Unspecified osteoarthritis, unspecified site: Secondary | ICD-10-CM | POA: Diagnosis present

## 2022-01-06 DIAGNOSIS — E1151 Type 2 diabetes mellitus with diabetic peripheral angiopathy without gangrene: Secondary | ICD-10-CM | POA: Diagnosis present

## 2022-01-06 DIAGNOSIS — Z1152 Encounter for screening for COVID-19: Secondary | ICD-10-CM | POA: Diagnosis not present

## 2022-01-06 DIAGNOSIS — I959 Hypotension, unspecified: Secondary | ICD-10-CM | POA: Diagnosis not present

## 2022-01-06 DIAGNOSIS — I6381 Other cerebral infarction due to occlusion or stenosis of small artery: Secondary | ICD-10-CM | POA: Diagnosis not present

## 2022-01-06 DIAGNOSIS — I69351 Hemiplegia and hemiparesis following cerebral infarction affecting right dominant side: Secondary | ICD-10-CM | POA: Diagnosis not present

## 2022-01-06 DIAGNOSIS — R809 Proteinuria, unspecified: Secondary | ICD-10-CM | POA: Diagnosis present

## 2022-01-06 DIAGNOSIS — G9341 Metabolic encephalopathy: Secondary | ICD-10-CM | POA: Diagnosis present

## 2022-01-06 DIAGNOSIS — Z87440 Personal history of urinary (tract) infections: Secondary | ICD-10-CM | POA: Diagnosis not present

## 2022-01-06 DIAGNOSIS — R000 Tachycardia, unspecified: Secondary | ICD-10-CM | POA: Diagnosis not present

## 2022-01-06 DIAGNOSIS — L97522 Non-pressure chronic ulcer of other part of left foot with fat layer exposed: Secondary | ICD-10-CM | POA: Diagnosis not present

## 2022-01-06 DIAGNOSIS — S91302A Unspecified open wound, left foot, initial encounter: Secondary | ICD-10-CM | POA: Diagnosis not present

## 2022-01-06 DIAGNOSIS — Z79899 Other long term (current) drug therapy: Secondary | ICD-10-CM | POA: Diagnosis not present

## 2022-01-06 DIAGNOSIS — I739 Peripheral vascular disease, unspecified: Secondary | ICD-10-CM | POA: Diagnosis not present

## 2022-01-06 DIAGNOSIS — Z888 Allergy status to other drugs, medicaments and biological substances status: Secondary | ICD-10-CM | POA: Diagnosis not present

## 2022-01-06 DIAGNOSIS — I10 Essential (primary) hypertension: Secondary | ICD-10-CM | POA: Diagnosis present

## 2022-01-06 DIAGNOSIS — E11649 Type 2 diabetes mellitus with hypoglycemia without coma: Secondary | ICD-10-CM | POA: Diagnosis present

## 2022-01-06 DIAGNOSIS — M86172 Other acute osteomyelitis, left ankle and foot: Secondary | ICD-10-CM | POA: Diagnosis present

## 2022-01-06 DIAGNOSIS — E119 Type 2 diabetes mellitus without complications: Secondary | ICD-10-CM | POA: Diagnosis not present

## 2022-01-06 DIAGNOSIS — R4182 Altered mental status, unspecified: Secondary | ICD-10-CM | POA: Diagnosis not present

## 2022-01-06 DIAGNOSIS — M869 Osteomyelitis, unspecified: Secondary | ICD-10-CM | POA: Diagnosis not present

## 2022-01-06 DIAGNOSIS — E1165 Type 2 diabetes mellitus with hyperglycemia: Secondary | ICD-10-CM | POA: Diagnosis present

## 2022-01-06 DIAGNOSIS — E11621 Type 2 diabetes mellitus with foot ulcer: Secondary | ICD-10-CM | POA: Diagnosis present

## 2022-01-06 DIAGNOSIS — R4781 Slurred speech: Secondary | ICD-10-CM | POA: Diagnosis present

## 2022-01-06 DIAGNOSIS — I69952 Hemiplegia and hemiparesis following unspecified cerebrovascular disease affecting left dominant side: Secondary | ICD-10-CM | POA: Diagnosis not present

## 2022-01-06 DIAGNOSIS — E876 Hypokalemia: Secondary | ICD-10-CM | POA: Diagnosis present

## 2022-01-06 DIAGNOSIS — Z7984 Long term (current) use of oral hypoglycemic drugs: Secondary | ICD-10-CM | POA: Diagnosis not present

## 2022-01-06 DIAGNOSIS — R404 Transient alteration of awareness: Secondary | ICD-10-CM | POA: Diagnosis present

## 2022-01-06 DIAGNOSIS — R0689 Other abnormalities of breathing: Secondary | ICD-10-CM | POA: Diagnosis not present

## 2022-01-06 DIAGNOSIS — L97526 Non-pressure chronic ulcer of other part of left foot with bone involvement without evidence of necrosis: Secondary | ICD-10-CM | POA: Diagnosis present

## 2022-01-06 DIAGNOSIS — R531 Weakness: Secondary | ICD-10-CM | POA: Diagnosis not present

## 2022-01-06 DIAGNOSIS — E1142 Type 2 diabetes mellitus with diabetic polyneuropathy: Secondary | ICD-10-CM | POA: Diagnosis present

## 2022-01-06 NOTE — Progress Notes (Signed)
[name], [name] ([id number]) [id number] Page 1 of 2 Visit Report for 01/06/2022 Chief Complaint Document Details Patient Name: Date of Service: [name] 01/06/2022 11:15 A M Medical Record Number: [id number] Patient Account Number: [id number] Date of Birth/Sex: Treating RN: 19-Mar-1933 (88 y.o. Freddy Finner Primary Care Provider: [name] Other Clinician: Betha Loa Referring Provider: Treating Provider/Extender: Joylene Grapes in Treatment: 22 Information Obtained from: Patient Chief Complaint Left foot ulcer with osteomyelitis Electronic Signature(s) Signed: 01/06/2022 11:27:45 AM By: Lenda Kelp PA-C Entered By: Lenda Kelp on 01/06/2022 11:27:45 -------------------------------------------------------------------------------- Problem List Details Patient Name: Date of Service: [name], Cinda Quest. 01/06/2022 11:15 A M Medical Record Number: [id number] Patient Account Number: [id number] Date of Birth/Sex: Treating RN: 11-23-1933 (88 y.o. Freddy Finner Primary Care Provider: [name] Other Clinician: Betha Loa Referring Provider: Treating Provider/Extender: Joylene Grapes in Treatment: 22 Active Problems ICD-10 Encounter Code Description Active Date MDM Diagnosis [phone] Other chronic osteomyelitis, left ankle and foot 11/11/2021 No Yes I73.89 Other specified peripheral vascular diseases 08/01/2021 No Yes E11.621 Type 2 diabetes mellitus with foot ulcer 08/01/2021 No Yes Lyter, Kemari G ([id number]) [id number] Page 2 of 2 [phone] Non-pressure chronic ulcer of other part of left foot with necrosis of bone 08/01/2021 No Yes L97.822 Non-pressure chronic ulcer of other part of left lower leg with fat layer exposed6/22/2023 No Yes L97.422 Non-pressure chronic ulcer of left heel and midfoot with fat layer exposed 08/01/2021 No Yes F01.50  Vascular dementia, unspecified severity, without behavioral disturbance, 08/01/2021 No Yes psychotic disturbance, mood disturbance, and anxiety Inactive Problems Resolved Problems Electronic Signature(s) Signed: 01/06/2022 11:27:41 AM By: Lenda Kelp PA-C Entered By: Lenda Kelp on 01/06/2022 11:27:41

## 2022-01-07 ENCOUNTER — Emergency Department: Payer: Medicare HMO

## 2022-01-07 ENCOUNTER — Encounter: Payer: Self-pay | Admitting: Internal Medicine

## 2022-01-07 ENCOUNTER — Other Ambulatory Visit: Payer: Self-pay

## 2022-01-07 ENCOUNTER — Inpatient Hospital Stay
Admission: EM | Admit: 2022-01-07 | Discharge: 2022-01-11 | DRG: 981 | Disposition: A | Discharge status: Home Health | Payer: Medicare HMO | Attending: Hospitalist | Admitting: Hospitalist

## 2022-01-07 DIAGNOSIS — S81802A Unspecified open wound, left lower leg, initial encounter: Secondary | ICD-10-CM | POA: Diagnosis present

## 2022-01-07 DIAGNOSIS — Z1152 Encounter for screening for COVID-19: Secondary | ICD-10-CM

## 2022-01-07 DIAGNOSIS — E11621 Type 2 diabetes mellitus with foot ulcer: Secondary | ICD-10-CM | POA: Diagnosis not present

## 2022-01-07 DIAGNOSIS — Z79899 Other long term (current) drug therapy: Secondary | ICD-10-CM

## 2022-01-07 DIAGNOSIS — I69952 Hemiplegia and hemiparesis following unspecified cerebrovascular disease affecting left dominant side: Secondary | ICD-10-CM

## 2022-01-07 DIAGNOSIS — E1169 Type 2 diabetes mellitus with other specified complication: Principal | ICD-10-CM | POA: Diagnosis present

## 2022-01-07 DIAGNOSIS — M1990 Unspecified osteoarthritis, unspecified site: Secondary | ICD-10-CM | POA: Diagnosis present

## 2022-01-07 DIAGNOSIS — Z96653 Presence of artificial knee joint, bilateral: Secondary | ICD-10-CM | POA: Diagnosis present

## 2022-01-07 DIAGNOSIS — E162 Hypoglycemia, unspecified: Principal | ICD-10-CM | POA: Diagnosis present

## 2022-01-07 DIAGNOSIS — R404 Transient alteration of awareness: Secondary | ICD-10-CM

## 2022-01-07 DIAGNOSIS — E11649 Type 2 diabetes mellitus with hypoglycemia without coma: Secondary | ICD-10-CM | POA: Diagnosis not present

## 2022-01-07 DIAGNOSIS — M86172 Other acute osteomyelitis, left ankle and foot: Secondary | ICD-10-CM | POA: Diagnosis present

## 2022-01-07 DIAGNOSIS — R531 Weakness: Secondary | ICD-10-CM

## 2022-01-07 DIAGNOSIS — L97526 Non-pressure chronic ulcer of other part of left foot with bone involvement without evidence of necrosis: Secondary | ICD-10-CM | POA: Diagnosis present

## 2022-01-07 DIAGNOSIS — G459 Transient cerebral ischemic attack, unspecified: Secondary | ICD-10-CM | POA: Diagnosis not present

## 2022-01-07 DIAGNOSIS — Z87440 Personal history of urinary (tract) infections: Secondary | ICD-10-CM

## 2022-01-07 DIAGNOSIS — E871 Hypo-osmolality and hyponatremia: Secondary | ICD-10-CM | POA: Diagnosis present

## 2022-01-07 DIAGNOSIS — B3731 Acute candidiasis of vulva and vagina: Secondary | ICD-10-CM | POA: Insufficient documentation

## 2022-01-07 DIAGNOSIS — R4182 Altered mental status, unspecified: Secondary | ICD-10-CM | POA: Diagnosis present

## 2022-01-07 DIAGNOSIS — E876 Hypokalemia: Secondary | ICD-10-CM | POA: Diagnosis present

## 2022-01-07 DIAGNOSIS — Z7984 Long term (current) use of oral hypoglycemic drugs: Secondary | ICD-10-CM

## 2022-01-07 DIAGNOSIS — R000 Tachycardia, unspecified: Secondary | ICD-10-CM | POA: Diagnosis not present

## 2022-01-07 DIAGNOSIS — E1151 Type 2 diabetes mellitus with diabetic peripheral angiopathy without gangrene: Secondary | ICD-10-CM | POA: Diagnosis present

## 2022-01-07 DIAGNOSIS — R809 Proteinuria, unspecified: Secondary | ICD-10-CM | POA: Diagnosis present

## 2022-01-07 DIAGNOSIS — Z7902 Long term (current) use of antithrombotics/antiplatelets: Secondary | ICD-10-CM

## 2022-01-07 DIAGNOSIS — G9341 Metabolic encephalopathy: Secondary | ICD-10-CM | POA: Diagnosis present

## 2022-01-07 DIAGNOSIS — I69951 Hemiplegia and hemiparesis following unspecified cerebrovascular disease affecting right dominant side: Secondary | ICD-10-CM

## 2022-01-07 DIAGNOSIS — S91302A Unspecified open wound, left foot, initial encounter: Secondary | ICD-10-CM | POA: Insufficient documentation

## 2022-01-07 DIAGNOSIS — L98499 Non-pressure chronic ulcer of skin of other sites with unspecified severity: Secondary | ICD-10-CM | POA: Diagnosis not present

## 2022-01-07 DIAGNOSIS — E782 Mixed hyperlipidemia: Secondary | ICD-10-CM | POA: Diagnosis present

## 2022-01-07 DIAGNOSIS — Z79891 Long term (current) use of opiate analgesic: Secondary | ICD-10-CM

## 2022-01-07 DIAGNOSIS — I739 Peripheral vascular disease, unspecified: Secondary | ICD-10-CM | POA: Diagnosis present

## 2022-01-07 DIAGNOSIS — I959 Hypotension, unspecified: Secondary | ICD-10-CM | POA: Diagnosis not present

## 2022-01-07 DIAGNOSIS — R4781 Slurred speech: Secondary | ICD-10-CM | POA: Diagnosis present

## 2022-01-07 DIAGNOSIS — E1142 Type 2 diabetes mellitus with diabetic polyneuropathy: Secondary | ICD-10-CM | POA: Diagnosis present

## 2022-01-07 DIAGNOSIS — I10 Essential (primary) hypertension: Secondary | ICD-10-CM | POA: Diagnosis present

## 2022-01-07 DIAGNOSIS — Z5309 Procedure and treatment not carried out because of other contraindication: Secondary | ICD-10-CM

## 2022-01-07 DIAGNOSIS — R0689 Other abnormalities of breathing: Secondary | ICD-10-CM | POA: Diagnosis not present

## 2022-01-07 DIAGNOSIS — L97529 Non-pressure chronic ulcer of other part of left foot with unspecified severity: Secondary | ICD-10-CM | POA: Diagnosis not present

## 2022-01-07 DIAGNOSIS — D72829 Elevated white blood cell count, unspecified: Secondary | ICD-10-CM | POA: Insufficient documentation

## 2022-01-07 DIAGNOSIS — I6381 Other cerebral infarction due to occlusion or stenosis of small artery: Secondary | ICD-10-CM | POA: Diagnosis not present

## 2022-01-07 DIAGNOSIS — E1129 Type 2 diabetes mellitus with other diabetic kidney complication: Secondary | ICD-10-CM | POA: Diagnosis present

## 2022-01-07 DIAGNOSIS — Z888 Allergy status to other drugs, medicaments and biological substances status: Secondary | ICD-10-CM

## 2022-01-07 DIAGNOSIS — T383X5A Adverse effect of insulin and oral hypoglycemic [antidiabetic] drugs, initial encounter: Secondary | ICD-10-CM | POA: Diagnosis present

## 2022-01-07 DIAGNOSIS — E1165 Type 2 diabetes mellitus with hyperglycemia: Secondary | ICD-10-CM | POA: Diagnosis present

## 2022-01-07 DIAGNOSIS — I69351 Hemiplegia and hemiparesis following cerebral infarction affecting right dominant side: Secondary | ICD-10-CM

## 2022-01-07 LAB — CBC WITH DIFFERENTIAL/PLATELET
Abs Immature Granulocytes: 0.07 10*3/uL (ref 0.00–0.07)
Basophils Absolute: 0 10*3/uL (ref 0.0–0.1)
Basophils Relative: 0 %
Eosinophils Absolute: 0 10*3/uL (ref 0.0–0.5)
Eosinophils Relative: 0 %
HCT: 38.3 % (ref 36.0–46.0)
Hemoglobin: 12.4 g/dL (ref 12.0–15.0)
Immature Granulocytes: 1 %
Lymphocytes Relative: 8 %
Lymphs Abs: 1 10*3/uL (ref 0.7–4.0)
MCH: 30.6 pg (ref 26.0–34.0)
MCHC: 32.4 g/dL (ref 30.0–36.0)
MCV: 94.6 fL (ref 80.0–100.0)
Monocytes Absolute: 0.6 10*3/uL (ref 0.1–1.0)
Monocytes Relative: 5 %
Neutro Abs: 10.4 10*3/uL — ABNORMAL HIGH (ref 1.7–7.7)
Neutrophils Relative %: 86 %
Platelets: 208 10*3/uL (ref 150–400)
RBC: 4.05 MIL/uL (ref 3.87–5.11)
RDW: 12.4 % (ref 11.5–15.5)
WBC: 12.1 10*3/uL — ABNORMAL HIGH (ref 4.0–10.5)
nRBC: 0 % (ref 0.0–0.2)

## 2022-01-07 LAB — CBG MONITORING, ED
Glucose-Capillary: 209 mg/dL — ABNORMAL HIGH (ref 70–99)
Glucose-Capillary: 42 mg/dL — CL (ref 70–99)
Glucose-Capillary: 50 mg/dL — ABNORMAL LOW (ref 70–99)
Glucose-Capillary: 103 mg/dL — ABNORMAL HIGH (ref 70–99)
Glucose-Capillary: 158 mg/dL — ABNORMAL HIGH (ref 70–99)
Glucose-Capillary: 187 mg/dL — ABNORMAL HIGH (ref 70–99)
Glucose-Capillary: 227 mg/dL — ABNORMAL HIGH (ref 70–99)

## 2022-01-07 LAB — URINALYSIS, ROUTINE W REFLEX MICROSCOPIC
Bacteria, UA: NONE SEEN
Bilirubin Urine: NEGATIVE
Glucose, UA: 150 mg/dL — AB
Hgb urine dipstick: NEGATIVE
Ketones, ur: NEGATIVE mg/dL
Leukocytes,Ua: NEGATIVE
Nitrite: NEGATIVE
Protein, ur: 30 mg/dL — AB
Specific Gravity, Urine: 1.019 (ref 1.005–1.030)
pH: 5 (ref 5.0–8.0)

## 2022-01-07 LAB — COMPREHENSIVE METABOLIC PANEL
ALT: 17 U/L (ref 0–44)
AST: 42 U/L — ABNORMAL HIGH (ref 15–41)
Albumin: 3.7 g/dL (ref 3.5–5.0)
Alkaline Phosphatase: 76 U/L (ref 38–126)
Anion gap: 10 (ref 5–15)
BUN: 16 mg/dL (ref 8–23)
CO2: 29 mmol/L (ref 22–32)
Calcium: 8.8 mg/dL — ABNORMAL LOW (ref 8.9–10.3)
Chloride: 99 mmol/L (ref 98–111)
Creatinine, Ser: 0.62 mg/dL (ref 0.44–1.00)
GFR, Estimated: >60 mL/min (ref >60)
Glucose, Bld: 245 mg/dL — ABNORMAL HIGH (ref 70–99)
Potassium: 3 mmol/L — ABNORMAL LOW (ref 3.5–5.1)
Sodium: 138 mmol/L (ref 135–145)
Total Bilirubin: 1.4 mg/dL — ABNORMAL HIGH (ref 0.3–1.2)
Total Protein: 7.2 g/dL (ref 6.5–8.1)

## 2022-01-07 LAB — PHOSPHORUS: Phosphorus: 2.4 mg/dL — ABNORMAL LOW (ref 2.5–4.6)

## 2022-01-07 LAB — RESP PANEL BY RT-PCR (FLU A&B, COVID) ARPGX2
Influenza A by PCR: NEGATIVE
Influenza B by PCR: NEGATIVE
SARS Coronavirus 2 by RT PCR: NEGATIVE

## 2022-01-07 LAB — TROPONIN I (HIGH SENSITIVITY): Troponin I (High Sensitivity): 9 ng/L (ref <18)

## 2022-01-07 LAB — PROCALCITONIN: Procalcitonin: <0.1 ng/mL

## 2022-01-07 LAB — SEDIMENTATION RATE: Sed Rate: 33 mm/hr — ABNORMAL HIGH (ref 0–30)

## 2022-01-07 LAB — MAGNESIUM: Magnesium: 1.7 mg/dL (ref 1.7–2.4)

## 2022-01-07 LAB — LACTIC ACID, PLASMA: Lactic Acid, Venous: 1.9 mmol/L (ref 0.5–1.9)

## 2022-01-07 MED ORDER — POTASSIUM CHLORIDE CRYS ER 20 MEQ PO TBCR
40.0000 meq | EXTENDED_RELEASE_TABLET | Freq: Once | ORAL | Status: AC
  Administered 2022-01-07: 40 meq via ORAL
  Filled 2022-01-07: qty 2

## 2022-01-07 MED ORDER — ACETAMINOPHEN 650 MG RE SUPP: 650.0000 mg | Freq: Four times a day (QID) | RECTAL | Status: AC | PRN

## 2022-01-07 MED ORDER — SODIUM CHLORIDE 0.9 % IV SOLN: 1.0000 g | INTRAVENOUS | Status: DC

## 2022-01-07 MED ORDER — DEXTROSE 10 % IV SOLN: INTRAVENOUS | Status: DC

## 2022-01-07 MED ORDER — ATORVASTATIN CALCIUM 20 MG PO TABS
80.0000 mg | ORAL_TABLET | Freq: Every day | ORAL | Status: DC
  Administered 2022-01-07 – 2022-01-10 (×4): 80 mg via ORAL
  Filled 2022-01-07 (×4): qty 4

## 2022-01-07 MED ORDER — CLOPIDOGREL BISULFATE 75 MG PO TABS
75.0000 mg | ORAL_TABLET | Freq: Every day | ORAL | Status: DC
  Administered 2022-01-07: 75 mg via ORAL
  Filled 2022-01-07: qty 1

## 2022-01-07 MED ORDER — INSULIN ASPART 100 UNIT/ML IJ SOLN
0.0000 [IU] | Freq: Every day | INTRAMUSCULAR | Status: DC
  Administered 2022-01-07: 2 [IU] via SUBCUTANEOUS
  Filled 2022-01-07: qty 1

## 2022-01-07 MED ORDER — ONDANSETRON HCL 4 MG PO TABS: 4.0000 mg | ORAL_TABLET | Freq: Four times a day (QID) | ORAL | Status: DC | PRN

## 2022-01-07 MED ORDER — VANCOMYCIN HCL 1250 MG/250ML IV SOLN
1250.0000 mg | Freq: Once | INTRAVENOUS | Status: AC
  Administered 2022-01-07: 1250 mg via INTRAVENOUS
  Filled 2022-01-07: qty 250

## 2022-01-07 MED ORDER — HYDRALAZINE HCL 10 MG PO TABS: 10.0000 mg | ORAL_TABLET | Freq: Four times a day (QID) | ORAL | Status: AC | PRN

## 2022-01-07 MED ORDER — VANCOMYCIN HCL IN DEXTROSE 1-5 GM/200ML-% IV SOLN
1000.0000 mg | INTRAVENOUS | Status: DC
  Administered 2022-01-08 – 2022-01-10 (×3): 1000 mg via INTRAVENOUS
  Filled 2022-01-07 (×5): qty 200

## 2022-01-07 MED ORDER — VANCOMYCIN HCL IN DEXTROSE 1-5 GM/200ML-% IV SOLN: 1000.0000 mg | Freq: Once | INTRAVENOUS | Status: DC

## 2022-01-07 MED ORDER — HEPARIN SODIUM (PORCINE) 5000 UNIT/ML IJ SOLN
5000.0000 [IU] | Freq: Three times a day (TID) | INTRAMUSCULAR | Status: DC
  Administered 2022-01-07 – 2022-01-11 (×9): 5000 [IU] via SUBCUTANEOUS
  Filled 2022-01-07 (×9): qty 1

## 2022-01-07 MED ORDER — SODIUM CHLORIDE 0.9 % IV SOLN
2.0000 g | Freq: Two times a day (BID) | INTRAVENOUS | Status: DC
  Administered 2022-01-08 – 2022-01-11 (×7): 2 g via INTRAVENOUS
  Filled 2022-01-07 (×5): qty 12.5
  Filled 2022-01-07 (×3): qty 2

## 2022-01-07 MED ORDER — SENNOSIDES-DOCUSATE SODIUM 8.6-50 MG PO TABS
1.0000 | ORAL_TABLET | Freq: Every evening | ORAL | Status: DC | PRN
  Administered 2022-01-08: 1 via ORAL
  Filled 2022-01-07: qty 1

## 2022-01-07 MED ORDER — ONDANSETRON HCL 4 MG/2ML IJ SOLN: 4.0000 mg | Freq: Four times a day (QID) | INTRAMUSCULAR | Status: DC | PRN

## 2022-01-07 MED ORDER — INSULIN ASPART 100 UNIT/ML IJ SOLN
0.0000 [IU] | Freq: Three times a day (TID) | INTRAMUSCULAR | Status: DC
  Administered 2022-01-08: 9 [IU] via SUBCUTANEOUS
  Administered 2022-01-08: 3 [IU] via SUBCUTANEOUS
  Administered 2022-01-09: 5 [IU] via SUBCUTANEOUS
  Administered 2022-01-10: 1 [IU] via SUBCUTANEOUS
  Filled 2022-01-07 (×4): qty 1

## 2022-01-07 MED ORDER — LISINOPRIL 5 MG PO TABS
5.0000 mg | ORAL_TABLET | Freq: Every day | ORAL | Status: DC
  Administered 2022-01-07 – 2022-01-11 (×5): 5 mg via ORAL
  Filled 2022-01-07 (×5): qty 1

## 2022-01-07 MED ORDER — DEXTROSE 50 % IV SOLN
1.0000 | Freq: Once | INTRAVENOUS | Status: AC
  Administered 2022-01-07: 50 mL via INTRAVENOUS
  Filled 2022-01-07: qty 50

## 2022-01-07 MED ORDER — HYDRALAZINE HCL 10 MG PO TABS: 10.0000 mg | ORAL_TABLET | Freq: Four times a day (QID) | ORAL | Status: DC | PRN

## 2022-01-07 MED ORDER — ENOXAPARIN SODIUM 40 MG/0.4ML IJ SOSY: 40.0000 mg | PREFILLED_SYRINGE | INTRAMUSCULAR | Status: DC

## 2022-01-07 MED ORDER — SODIUM CHLORIDE 0.9 % IV SOLN
1.0000 g | Freq: Once | INTRAVENOUS | Status: DC
  Filled 2022-01-07: qty 10

## 2022-01-07 MED ORDER — SODIUM CHLORIDE 0.9 % IV SOLN
2.0000 g | Freq: Once | INTRAVENOUS | Status: AC
  Administered 2022-01-07: 2 g via INTRAVENOUS
  Filled 2022-01-07: qty 12.5

## 2022-01-07 MED ORDER — ACETAMINOPHEN 325 MG PO TABS
650.0000 mg | ORAL_TABLET | Freq: Four times a day (QID) | ORAL | Status: AC | PRN
  Administered 2022-01-07 – 2022-01-10 (×2): 650 mg via ORAL
  Filled 2022-01-07 (×2): qty 2

## 2022-01-07 NOTE — Assessment & Plan Note (Signed)
Metabolic encephalopathy secondary to hypoglycemia with possible complication by cellulitis of her left foot - Treat per hypoglycemia

## 2022-01-07 NOTE — ED Provider Triage Note (Signed)
Emergency Medicine Provider Triage Evaluation Note  [name] , a 88 y.o. female  was evaluated in triage.  Pt complains of ams, brought in by ems.  Review of Systems  Positive:  Negative:   Physical Exam  There were no vitals taken for this visit. Gen:   Awake, no distress   Resp:  Normal effort  MSK:   Moves extremities without difficulty  Other:    Medical Decision Making  Medically screening exam initiated at 10:36 AM.  Appropriate orders placed.  [name] was informed that the remainder of the evaluation will be completed by another provider, this initial triage assessment does not replace that evaluation, and the importance of remaining in the ED until their evaluation is complete.     Faythe Ghee, PA-C 01/07/22 1037

## 2022-01-07 NOTE — Hospital Course (Addendum)
[name] is a 88 year old female with history of non-insulin-dependent diabetes mellitus, hyperlipidemia, hypertension, who presents emergency department for chief concerns of altered mental status.  She was found to have hypoglycemia. She also was found to have left lateral foot wound, seen by Dr. Ether Griffins from podiatry, bone biopsy performed on 11/30. Podiatry will follow up with the patient in 5 to 7 days for biopsy results.  Continue antibiotics with doxycycline and Keflex.  I will also discontinue glimepiride for hyperglycemia.  Glucose has been better.  She will follow-up with PCP as outpatient.

## 2022-01-07 NOTE — Consult Note (Signed)
Pharmacy Antibiotic Note  [name] is a 87 y.o. female w/ h/o diabetes, hypomagnesemia, recurrent UTIs, CVA, & peripheral artery disease admitted on 01/07/2022 with AMS, weakness, &  osteomyelitis .  Pharmacy has been consulted for vancomycin & cefepime dosing.  Plan: F/u MRSA PCR ordered & cultures.  Initiate vancomycin 1250mg  IV x1 loading dose; followed by 1000mg  IV Q24h per current renal fxn Goal AUC 400-600 Est AUC: 530; Cmax: 34.8; Cmin: 13.5 SCr 0.8 (rounded from 0.62); IBW/TBW; Vd 0.72  Initiate Cefepime 2g IV x1; followed by Cefepime 2g IV q12h starting 11/29 0900  Height: 5\' 4"  (162.6 cm) Weight: 63.5 kg (139 lb 15.9 oz) IBW/kg (Calculated) : 54.7  Temp (24hrs), Avg:97.9 F (36.6 C), Min:97.7 F (36.5 C), Max:98 F (36.7 C)  Recent Labs  Lab 01/07/22 1044  WBC 12.1*  CREATININE 0.62  LATICACIDVEN 1.9    Estimated Creatinine Clearance: 44.4 mL/min (by C-G formula based on SCr of 0.62 mg/dL).    Allergies  Allergen Reactions   Metformin And Related Nausea And Vomiting   Antimicrobials this admission: VAN/CFP (11/28 >>   Dose adjustments this admission: CTM and adjust PRN, currently baseline Scr 0.6-0.7  Microbiology results: No cultures 11/28 Flu/Covid: negative  11/28 MRSA PCR: pending  Thank you for allowing pharmacy to be a part of this patient's care.  07-04-2000 01/07/2022 8:56 PM

## 2022-01-07 NOTE — Assessment & Plan Note (Signed)
-   Patient has a retained metallic foreign body within the soft tissue at the superioromedial right orbit

## 2022-01-07 NOTE — Assessment & Plan Note (Addendum)
-   AM team to resumed Plavix 75 mg daily as appropriate when the benefits outweigh the risk

## 2022-01-07 NOTE — Assessment & Plan Note (Addendum)
-   Patient is on lisinopril 5 mg daily, hydrochlorothiazide 12.5 mg daily - HCTZ not resumed on admission due to hypokalemia - Hydralazine 10 mg p.o. every 6 hours as needed for SBP greater than 175, 4 days ordered

## 2022-01-07 NOTE — ED Triage Notes (Signed)
Pt brought in from home with co altered mental status, per EMS report symptoms started at 2130 yesterday. Pt had CBG of 55 and had D50. IV placed 22g to left AC>

## 2022-01-07 NOTE — H&P (Signed)
History and Physical   [name] HKV:[id number] DOB: [birth year] DOA: 01/07/2022  PCP: [name] Hire, MD  Outpatient Specialists: Dr. Linton [name], wound specialist Patient coming from: home via EMS  I have personally briefly reviewed patient's old medical records in [place].  Chief Concern: Altered mental status  HPI: [name] is a 88 year old female with history of non-insulin-dependent diabetes mellitus, hyperlipidemia, hypertension, who presents emergency department for chief concerns of altered mental status.  Initial vitals in the emergency department showed temperature of 97.7, respiration rate of 20, heart rate 84, blood pressure 169/103, SpO2 of 96% on room air.  Serum sodium is 138, potassium 3.0, chloride 99, bicarb 29, BUN of 16, serum creatinine of 0.62, EGFR greater than 60, nonfasting blood glucose 245, WBC 12.1, hemoglobin 12.5, platelets of 208.    Lactic acid is 1.9.  High sensitive troponin is 9.  Magnesium is 1.7.  UA is negative for leukocytes and nitrates.  COVID/influenza A/influenza B PCR were negative.  CT head without contrast: Was read as no acute intracranial hemorrhage or infarct.  Stable senescent said remote infarct.  Stable retained metallic foreign body within the soft tissue superomedial to the right orbit.  ED treatment: D50 1 amp, and D10 gtt. ordered --------------- At bedside patient was able to tell me her name, her and her age, she knows she is in the hospital. She is eating her dinner and does not appear to be in acute distress.  She reports that she was confused earlier today and that is why she is in the hospital.  She denies dysuria, hematuria, chest pain, abdominal pain, shortness of breath, nausea, vomiting.  She reports the food that she is eating in the hospital is very good.  I spoke with her son, he states that she did not take her diabetes medication today.  The last time she took it was yesterday.  He  denies patient endorsing any pain to him, vomiting, diarrhea, or syncope.  Social history: She lives by herself.  Her son lives in a property behind her property.  She does not use tobacco, etoh, and recreational drug use. 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  ROS: U Constitutional: no weight change, no fever ENT/Mouth: no sore throat, no rhinorrhea Eyes: no eye pain, no vision changes Cardiovascular: no chest pain, no dyspnea,  no edema, no palpitations Respiratory: no cough, no sputum, no wheezing Gastrointestinal: no nausea, no vomiting, no diarrhea, no constipation Genitourinary: no urinary incontinence, no dysuria, no hematuria Musculoskeletal: no arthralgias, no myalgias Skin: + skin lesions, no pruritus, Neuro: + weakness, no loss of consciousness, no syncope Psych: no anxiety, no depression, no decrease appetite Heme/Lymph: no bruising, no bleeding  ED Course: Discussed with emergency medicine provider, patient requiring hospitalization for chief concerns of altered mental status, and persistent hypoglycemia.  Assessment/Plan  Principal Problem:   Hypoglycemia Active Problems:   Type 2 diabetes mellitus with polyneuropathy (HCC)   Mixed hyperlipidemia   Essential hypertension   Altered mental status   Hypokalemia   PAD (peripheral artery disease) (HCC)   Type 2 diabetes mellitus with microalbuminuria, without long-term current use of insulin (HCC)   Leukocytosis   MRI contraindicated due to metal implant   Open wound of left foot   Assessment and Plan:  * Hypoglycemia - Presumptive diagnosis secondary to glimepiride, holding on admission - CBG check every 2 hours, 6 occurrence ordered with instructions that if patient CBG is greater than 100  consecutively for 3 times, let provider know so that CBG can be changed to 3 times daily AC with at bedtime - Continue D10 infusion  Open wound of left foot - Left lateral foot wound with deepening orifice  versus tracking sinus with bleeding and purulence - Cefepime and vancomycin per pharmacy - Podiatry has been consulted via secure chat, [name] has acknowledged the consultation - Holding home Plavix at this time pending podiatry evaluation  MRI contraindicated due to metal implant - Patient has a retained metallic foreign body within the soft tissue at the superioromedial right orbit  Leukocytosis - Etiology workup in progress, possible cellulitis - Podiatry has been consulted to [name] via secure chat and has knowledge the consultation, as patient follows with [name] at [facility] - Check procalcitonin, sed rate - In a patient with diabetes mellitus type 2 and foot wound on the left, I have ordered vancomycin and cefepime per pharmacy - Telemetry medical, observation  Type 2 diabetes mellitus with microalbuminuria, without long-term current use of insulin ([place]) - Holding home metformin and glimepiride on admission - Patient is on D10 infusion  PAD (peripheral artery disease) ([place]) - AM team to resumed Plavix 75 mg daily as appropriate when the benefits outweigh the risk  Hypokalemia - Magnesium check on admission is 1.7 - Potassium chloride 40 mill equivalents p.o. one-time dose ordered - Holding home hydrochlorothiazide 12.5 mg daily  Altered mental status Metabolic encephalopathy secondary to hypoglycemia with possible complication by cellulitis of her left foot - Treat per hypoglycemia  Essential hypertension - Patient is on lisinopril 5 mg daily, hydrochlorothiazide 12.5 mg daily - HCTZ not resumed on admission due to hypokalemia - Hydralazine 10 mg p.o. every 6 hours as needed for SBP greater than 175, 4 days ordered  Mixed hyperlipidemia - Atorvastatin 80 mg nightly resumed  Type 2 diabetes mellitus with polyneuropathy ([place]) - Insulin SSI with agents coverage ordered  Chart reviewed.   DVT prophylaxis: Heparin 5000 units subcutaneous every 8  hours Code Status: Full code Diet: Heart healthy Family Communication: Called and updated son, Mr. [name] at [phone] Disposition Plan: Pending clinical course Consults called: Podiatry Admission status: Telemetry medical, observation  Past Medical History:  Diagnosis Date   Carpal tunnel syndrome on both sides    Diabetes ([place])    Diabetic polyneuropathy ([place])    Diverticulitis    Hyperlipidemia    Hypertension    Osteoarthritis    Vaginal prolapse    Past Surgical History:  Procedure Laterality Date   CESAREAN SECTION     LOWER EXTREMITY ANGIOGRAPHY Left 06/11/2021   Procedure: Lower Extremity Angiography;  Surgeon: Katha Cabal, MD;  Location: [facility];  Service: Cardiovascular;  Laterality: Left;   pessary     REPLACEMENT TOTAL KNEE BILATERAL     VENTRAL HERNIA REPAIR     VENTRAL HERNIA REPAIR N/A 01/13/2016   Procedure: HERNIA REPAIR VENTRAL ADULT WITH SMALL BOWEL RESECTION, LYSIS OF ADHESIONS;  Surgeon: Hubbard Robinson, MD;  Location: [facility];  Service: General;  Laterality: N/A;   Social History:  reports that she has never smoked. She has never used smokeless tobacco. She reports that she does not drink alcohol and does not use drugs.  Allergies  Allergen Reactions   Metformin And Related Nausea And Vomiting   Family History  Problem Relation Age of Onset   Breast cancer Mother 47   Breast cancer Maternal Aunt    Family history: Family  history reviewed and not pertinent  Prior to Admission medications   Medication Sig Start Date End Date Taking? Authorizing Provider  hydrochlorothiazide (HYDRODIURIL) 12.5 MG tablet Take 12.5 mg by mouth daily. 11/08/21  Yes [provider]  acetaminophen (TYLENOL) 325 MG tablet Take 2 tablets (650 mg total) by mouth every 6 (six) hours as needed for mild pain, moderate pain, fever or headache. 01/25/16   Loflin, Lavona Mound, MD  atorvastatin (LIPITOR) 80 MG tablet Take 1 tablet (80 mg  total) by mouth daily. 09/26/19   Regalado, Belkys A, MD  cephALEXin (KEFLEX) 500 MG capsule Take 500 mg by mouth 3 (three) times daily.    [provider]  clopidogrel (PLAVIX) 75 MG tablet Take 1 tablet (75 mg total) by mouth daily. 09/26/19   Regalado, Belkys A, MD  glimepiride (AMARYL) 4 MG tablet Take 4 mg by mouth daily with breakfast.    [provider]  hydrochlorothiazide (MICROZIDE) 12.5 MG capsule Take 12.5 mg by mouth daily.    [provider]  lisinopril (ZESTRIL) 5 MG tablet Take 5 mg by mouth daily.    [provider]  metFORMIN (GLUCOPHAGE) 500 MG tablet Take 1 tablet (500 mg total) by mouth 2 (two) times daily with a meal. Patient not taking: Reported on 06/06/2021 05/17/21 05/17/22  Sharen Hones, MD  naproxen sodium (ALEVE) 220 MG tablet Take 220 mg by mouth daily as needed (pain).    [provider]  traMADol (ULTRAM) 50 MG tablet Take 50 mg by mouth every 8 (eight) hours as needed for moderate pain.    [provider]   Physical Exam: Vitals:   01/07/22 1558 01/07/22 1724 01/07/22 1854 01/07/22 2031  BP: (!) 168/91 (!) 156/95 (!) 146/101   Pulse: 87 83 85   Resp: (!) _0 Temp: 98 F (36.7 C)     TempSrc: Oral     SpO2: 98% 95% 98%   Weight:    63.5 kg  Height:    5' 4" (1.626 m)   Constitutional: appears age-appropriate, frail, NAD, calm, comfortable Eyes: PERRL, lids and conjunctivae normal ENMT: Mucous membranes are moist. Posterior pharynx clear of any exudate or lesions. Age-appropriate dentition. Hearing appropriate Neck: normal, supple, no masses, no thyromegaly Respiratory: clear to auscultation bilaterally, no wheezing, no crackles. Normal respiratory effort. No accessory muscle use.  Cardiovascular: Regular rate and rhythm, no murmurs / rubs / gallops. No extremity edema. 2+ pedal pulses. No carotid bruits.  Abdomen: no tenderness, no masses palpated, no hepatosplenomegaly. Bowel sounds positive.   Musculoskeletal: no clubbing / cyanosis. No joint deformity upper and lower extremities. Good ROM, no contractures, no atrophy. Normal muscle tone.  Skin: no rashes. + lesions, ulcers      Neurologic: Sensation intact. Strength 5/5 in all 4.  Psychiatric: Normal judgment and insight. Alert and oriented x 3. Normal mood.   EKG: independently reviewed, showing sinus rhythm with rate of 85, QTc 480  Chest x-ray on Admission: I personally reviewed and I agree with radiologist reading as below.  DG Foot Complete Left  Result Date: 01/07/2022 CLINICAL DATA:  Diabetic foot wound. EXAM: LEFT FOOT - COMPLETE 3+ VIEW COMPARISON:  November 13, 2021. FINDINGS: There is no evidence of fracture or dislocation. No lytic destruction is seen to suggest osteomyelitis. Vascular calcifications are noted. Soft tissue ulceration is again noted lateral to the proximal base of the fifth metatarsal. IMPRESSION: Lateral soft tissue ulceration is again noted. No definite lytic destruction  is seen to suggest osteomyelitis. Electronically Signed   By: Marijo Conception M.D.   On: 01/07/2022 16:31   DG Chest 2 View  Result Date: 01/07/2022 CLINICAL DATA:  Altered mental status in a female at age 25. EXAM: CHEST - 2 VIEW COMPARISON:  September 20, 2019. FINDINGS: Image rotated to the LEFT. Accounting for this cardiomediastinal contours and hilar structures are stable. Lungs are clear. No sign of pleural effusion.  No pneumothorax. On limited assessment no acute skeletal process. Marked glenohumeral degenerative changes. IMPRESSION: 1. No acute cardiopulmonary disease. 2. Marked glenohumeral degenerative changes. Electronically Signed   By: Zetta Bills M.D.   On: 01/07/2022 11:33   CT HEAD WO CONTRAST (5MM)  Result Date: 01/07/2022 CLINICAL DATA:  Mental status change, unknown cause, slurred speech EXAM: CT HEAD WITHOUT CONTRAST TECHNIQUE: Contiguous axial images were obtained from the base of the skull through the vertex  without intravenous contrast. RADIATION DOSE REDUCTION: This exam was performed according to the departmental dose-optimization program which includes automated exposure control, adjustment of the mA and/or kV according to patient size and/or use of iterative reconstruction technique. COMPARISON:  06/01/2021 FINDINGS: Brain: Normal anatomic configuration. Parenchymal volume loss is commensurate with the patient's age. Moderate periventricular white matter changes are present likely reflecting the sequela of small vessel ischemia. 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 No abnormal intra or extra-axial mass lesion or fluid collection. No abnormal mass effect or midline shift. No evidence of acute intracranial hemorrhage or infarct. Ventricular size is normal. Cerebellum unremarkable. Vascular: No asymmetric hyperdense vasculature at the skull base. Skull: Intact Sinuses/Orbits: Paranasal sinuses are clear. Ocular lenses have been removed. Orbits are otherwise unremarkable. Other: Mastoid air cells and middle ear cavities are clear. Retained metallic foreign body again seen within the soft tissues superomedial to the right orbit. IMPRESSION: 1. No acute intracranial hemorrhage or infarct. 2. Stable senescent changes and remote infarcts. 3. Stable retained metallic foreign body within the soft tissues superomedial to the right orbit. Electronically Signed   By: Fidela Salisbury M.D.   On: 01/07/2022 11:15    Labs on Admission: I have personally reviewed following labs  CBC: Recent Labs  Lab 01/07/22 1044  WBC 12.1*  NEUTROABS 10.4*  HGB 12.4  HCT 38.3  MCV 94.6  PLT 025   Basic Metabolic Panel: Recent Labs  Lab 01/07/22 1044 01/07/22 1052  NA 138  --   K 3.0*  --   CL 99  --   CO2 29  --   GLUCOSE 245*  --   BUN 16  --   CREATININE 0.62  --   CALCIUM 8.8*  --   MG  --  1.7   GFR: Estimated  Creatinine Clearance: 44.4 mL/min (by C-G formula based on SCr of 0.62 mg/dL).  Liver Function Tests: Recent Labs  Lab 01/07/22 1044  AST 42*  ALT 17  ALKPHOS 76  BILITOT 1.4*  PROT 7.2  ALBUMIN 3.7   CBG: Recent Labs  Lab 01/07/22 1700 01/07/22 1716 01/07/22 1741 01/07/22 1830 01/07/22 2008  GLUCAP 42* 50* 103* 158* 187*   Urine analysis:    Component Value Date/Time   COLORURINE YELLOW (A) 01/07/2022 1645   APPEARANCEUR HAZY (A) 01/07/2022 1645   LABSPEC 1.019 01/07/2022 1645   PHURINE 5.0 01/07/2022 1645   GLUCOSEU 150 (A) 01/07/2022 1645   HGBUR NEGATIVE 01/07/2022 [place] 01/07/2022 1645  BILIRUBINUR neg 08/08/2019 [place] 01/07/2022 1645   PROTEINUR 30 (A) 01/07/2022 1645   UROBILINOGEN 0.2 08/08/2019 0922   NITRITE NEGATIVE 01/07/2022 1645   LEUKOCYTESUR NEGATIVE 01/07/2022 1645   Dr. Tobie Poet [facility]  If 7PM-7AM, please contact overnight-coverage provider If 7AM-7PM, please contact day coverage provider [email or web address]  01/07/2022, 8:35 PM

## 2022-01-07 NOTE — Assessment & Plan Note (Signed)
-   Insulin SSI with agents coverage ordered ?

## 2022-01-07 NOTE — ED Provider Notes (Signed)
[facility] Provider Note  Patient Contact: 3:34 PM (approximate)   History   Altered Mental Status   HPI  [name] is a 88 y.o. female who presents the emergency department complaining of altered mental status, weakness.  According to EMS who is the primary historian, patient was her normal self last night roughly 930.  Patient was seen by family this morning when she got up and stated that she had "altered mental status."  She reportedly had slurred speech.  More confusion than normal.  Patient has a history of diabetes, hypomagnesemia, recurrent UTIs, CVA, peripheral artery disease.   During interview with patient, patient denied feeling confused, feeling altered.  She denies any headache, visual changes, chest pain.  She has some residual weakness on the right side from previous CVA and states that there is no change there.  She denied any numbness or tingling.  Her only complaint at this time is left foot pain.  She states that she started to have some pain yesterday, a sore opened up last night and is worsened today.  Patient denies any other complaints.     Physical Exam   Triage Vital Signs: ED Triage Vitals  Enc Vitals Group     BP 01/07/22 1045 (!) 169/103     Pulse Rate 01/07/22 1045 84     Resp 01/07/22 1045 20     Temp 01/07/22 1045 97.7 F (36.5 C)     Temp Source 01/07/22 1045 Oral     SpO2 01/07/22 1045 96 %     Weight --      Height --      Head Circumference --      Peak Flow --      Pain Score 01/07/22 1042 0     Pain Loc --      Pain Edu? --      Excl. in GC? --     Most recent vital signs: Vitals:   01/07/22 1558 01/07/22 1724  BP: (!) 168/91 (!) 156/95  Pulse: 87 83  Resp: (!) 22 20  Temp: 98 F (36.7 C)   SpO2: 98% 95%     General: Alert and in no acute distress.  Patient is currently alert, oriented to time, location, self and events leading to current encounter Eyes:  PERRL. EOMI. Head: No acute traumatic  findings ENT:      Ears:       Nose: No congestion/rhinnorhea.      Mouth/Throat: Mucous membranes are moist. Neck: No stridor. No cervical spine tenderness to palpation.  Cardiovascular:  Good peripheral perfusion Respiratory: Normal respiratory effort without tachypnea or retractions. Lungs CTAB. Good air entry to the bases with no decreased or absent breath sounds. Gastrointestinal: Bowel sounds 4 quadrants. Soft and nontender to palpation. No guarding or rigidity. No palpable masses. No distention. No CVA tenderness. Musculoskeletal: Full range of motion to all extremities.  Visualization of the left foot reveals what appears to be a diabetic foot ulcer along the midfoot lateral aspect roughly over the base of the fifth metatarsal.  Slight erythema, tenderness to palpation.  No purulent drainage.  No gross edema of the foot when compared with right. Neurologic:  No gross focal neurologic deficits are appreciated. CN II-XII grossly intact Skin:   No rash noted Other:   ED Results / Procedures / Treatments   Labs (all labs ordered are listed, but only abnormal results are displayed) Labs Reviewed  COMPREHENSIVE METABOLIC PANEL -  Abnormal; Notable for the following components:      Result Value   Potassium 3.0 (*)    Glucose, Bld 245 (*)    Calcium 8.8 (*)    AST 42 (*)    Total Bilirubin 1.4 (*)    All other components within normal limits  URINALYSIS, ROUTINE W REFLEX MICROSCOPIC - Abnormal; Notable for the following components:   Color, Urine YELLOW (*)    APPearance HAZY (*)    Glucose, UA 150 (*)    Protein, ur 30 (*)    All other components within normal limits  CBC WITH DIFFERENTIAL/PLATELET - Abnormal; Notable for the following components:   WBC 12.1 (*)    Neutro Abs 10.4 (*)    All other components within normal limits  CBG MONITORING, ED - Abnormal; Notable for the following components:   Glucose-Capillary 209 (*)    All other components within normal limits  CBG  MONITORING, ED - Abnormal; Notable for the following components:   Glucose-Capillary 42 (*)    All other components within normal limits  CBG MONITORING, ED - Abnormal; Notable for the following components:   Glucose-Capillary 50 (*)    All other components within normal limits  CBG MONITORING, ED - Abnormal; Notable for the following components:   Glucose-Capillary 103 (*)    All other components within normal limits  RESP PANEL BY RT-PCR (FLU A&B, COVID) ARPGX2  LACTIC ACID, PLASMA  MAGNESIUM  PHOSPHORUS  BASIC METABOLIC PANEL  CBC  HEMOGLOBIN A1C  TROPONIN I (HIGH SENSITIVITY)     EKG  ED ECG REPORT I, Delorise Royals Rumaysa Sabatino,  personally viewed and interpreted this ECG.   Date: 01/07/2022   EKG Time: 1041 hrs.  Rate: 85 bpm  Rhythm: unchanged from previous tracings, normal sinus rhythm, no significant change from previous EKG from 09/22/2019.  Patient does have large amount of artifact on EKG  Axis: Normal axis  Intervals:none  ST&T Change: No gross ST elevation or depression noted  Normal sinus rhythm.  No significant change from previous EKG.  Patient does have large amount of artifact though no acute findings on EKG.    RADIOLOGY  I personally viewed, evaluated, and interpreted these images as part of my medical decision making, as well as reviewing the written report by the radiologist.  ED Provider Interpretation: Chest x-ray without acute cardiopulmonary findings.  CT scan of the head reveals no acute intracranial hemorrhage.  There is stable changes and remote infarcts.  DG Foot Complete Left  Result Date: 01/07/2022 CLINICAL DATA:  Diabetic foot wound. EXAM: LEFT FOOT - COMPLETE 3+ VIEW COMPARISON:  November 13, 2021. FINDINGS: There is no evidence of fracture or dislocation. No lytic destruction is seen to suggest osteomyelitis. Vascular calcifications are noted. Soft tissue ulceration is again noted lateral to the proximal base of the fifth metatarsal.  IMPRESSION: Lateral soft tissue ulceration is again noted. No definite lytic destruction is seen to suggest osteomyelitis. Electronically Signed   By: Lupita Raider M.D.   On: 01/07/2022 16:31   DG Chest 2 View  Result Date: 01/07/2022 CLINICAL DATA:  Altered mental status in a female at age 70. EXAM: CHEST - 2 VIEW COMPARISON:  September 20, 2019. FINDINGS: Image rotated to the LEFT. Accounting for this cardiomediastinal contours and hilar structures are stable. Lungs are clear. No sign of pleural effusion.  No pneumothorax. On limited assessment no acute skeletal process. Marked glenohumeral degenerative changes. IMPRESSION: 1. No acute cardiopulmonary disease. 2. Marked  glenohumeral degenerative changes. Electronically Signed   By: Donzetta Kohut M.D.   On: 01/07/2022 11:33   CT HEAD WO CONTRAST ( )  Result Date: 01/07/2022 CLINICAL DATA:  Mental status change, unknown cause, slurred speech EXAM: CT HEAD WITHOUT CONTRAST TECHNIQUE: Contiguous axial images were obtained from the base of the skull through the vertex without intravenous contrast. RADIATION DOSE REDUCTION: This exam was performed according to the departmental dose-optimization program which includes automated exposure control, adjustment of the mA and/or kV according to patient size and/or use of iterative reconstruction technique. COMPARISON:  06/01/2021 FINDINGS: Brain: Normal anatomic configuration. Parenchymal volume loss is commensurate with the patient's age. Moderate periventricular white matter changes are present likely reflecting the sequela of small vessel ischemia. 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 No abnormal intra or extra-axial mass lesion or fluid collection. No abnormal mass effect or midline shift. No evidence of acute intracranial hemorrhage or infarct. Ventricular size is normal. Cerebellum unremarkable. Vascular: No  asymmetric hyperdense vasculature at the skull base. Skull: Intact Sinuses/Orbits: Paranasal sinuses are clear. Ocular lenses have been removed. Orbits are otherwise unremarkable. Other: Mastoid air cells and middle ear cavities are clear. Retained metallic foreign body again seen within the soft tissues superomedial to the right orbit. IMPRESSION: 1. No acute intracranial hemorrhage or infarct. 2. Stable senescent changes and remote infarcts. 3. Stable retained metallic foreign body within the soft tissues superomedial to the right orbit. Electronically Signed   By: Helyn Numbers M.D.   On: 01/07/2022 11:15    PROCEDURES:  Critical Care performed: No  Procedures   MEDICATIONS ORDERED IN ED: Medications  dextrose 10 % infusion ( Intravenous New Bag/Given 01/07/22 1806)  acetaminophen (TYLENOL) tablet 650 mg (has no administration in time range)    Or  acetaminophen (TYLENOL) suppository 650 mg (has no administration in time range)  ondansetron (ZOFRAN) tablet 4 mg (has no administration in time range)    Or  ondansetron (ZOFRAN) injection 4 mg (has no administration in time range)  enoxaparin (LOVENOX) injection 40 mg (has no administration in time range)  senna-docusate (Senokot-S) tablet 1 tablet (has no administration in time range)  hydrALAZINE (APRESOLINE) tablet 10 mg (has no administration in time range)  potassium chloride SA (KLOR-CON M) CR tablet 40 mEq (has no administration in time range)  insulin aspart (novoLOG) injection 0-9 Units (has no administration in time range)  insulin aspart (novoLOG) injection 0-5 Units (has no administration in time range)  dextrose 50 % solution 50 mL (50 mLs Intravenous Given 01/07/22 1720)     IMPRESSION / MDM / ASSESSMENT AND PLAN / ED COURSE  I reviewed the triage vital signs and the nursing notes.                              Differential diagnosis includes, but is not limited to, TIA, CVA, UTI, hyperglycemia, hypoglycemia,  osteomyelitis, cellulitis  Patient's presentation is most consistent with acute presentation with potential threat to life or bodily function.   Patient's diagnosis is consistent with transient altered mental status, hypoglycemia, weakness.  Patient presents emergency department with a complaint of weakness and altered mental status earlier today.  EMS was called.  Last known well was last night.  Patient was found to be hypoglycemic by EMS, given an amp of D50.  Patient arrived with glucoses in the 200s.  Patient has  returned back to her normal mental baseline according to the patient and family who is currently present.  Patient's only complaint was left foot pain.  It appears that patient has a diabetic foot ulcer that is being followed by wound care.  There is no gross surrounding cellulitic changes.  X-ray revealed no evidence of osteomyelitis.  Patient's labs are relatively reassuring.  Imaging of the head, chest was reassuring.  Initially with patient being at her baseline, with a suspicion that this was just hypoglycemia was considering that patient may be suitable for discharge, however patient's sugar started to fall and ultimately patient required another amp of D50 and a D10 drip.  I suspect that this is secondary to her sulfonylurea.  However as we are unable to maintain glucose with oral management and the fact that patient is requiring ongoing administrations of dextrose feel that the patient should be admitted.  Reached out to the hospitalist service who agrees to admit the patient at this time.  Patient care will be transferred to the hospitalist team at this time..     Clinical Course as of 01/07/22 1818  Tue Jan 07, 2022  1611 Patient presents emergency department with reported altered mental status by family from home.  Reportedly patient was confused this morning with slurred speech.  Does have a history of remote CVAs, history of diabetes.  Patient was hypoglycemic at 55 this morning,  given glucose and glucose here was 245.  Patient with remote history of stroke, chronic right side deficits.  Patient denies any complaints other than foot pain/diabetic foot wound that has recently developed over the last 2 to 3 days.  Patient is AOx4 at this time.  CT scan is reassuring without acute infarct or hemorrhage.  Patient's labs are overall reassuring though patient does have an elevation in her white blood cell count to 12.1, neutrophils at 10.4.  Patient still has urinalysis, x-ray of the foot pending at this time. [JC]  1701 Repeat glucose of 42 [JC]  1701 X-ray of the foot returns with ongoing soft tissue ulceration without acute findings of osteomyelitis.  Patient sees wound care on a regular basis for this foot. [JC]    Clinical Course User Index [JC] Jeff Frieden, Delorise Royals, PA-C     FINAL CLINICAL IMPRESSION(S) / ED DIAGNOSES   Final diagnoses:  Hypoglycemia  Weakness  Transient alteration of awareness     Rx / DC Orders   ED Discharge Orders     None        Note:  This document was prepared using Dragon voice recognition software and may include unintentional dictation errors.   Lanette Hampshire 01/07/22 1818    Chesley Noon, MD 01/07/22 [phone]

## 2022-01-07 NOTE — Assessment & Plan Note (Addendum)
-   Presumptive diagnosis secondary to glimepiride, holding on admission - CBG check every 2 hours, 6 occurrence ordered with instructions that if patient CBG is greater than 100 consecutively for 3 times, let provider know so that CBG can be changed to 3 times daily AC with at bedtime - Continue D10 infusion

## 2022-01-07 NOTE — ED Notes (Signed)
Report to alex

## 2022-01-07 NOTE — ED Triage Notes (Signed)
Presents from home via EMS   Family saw her last pm  she was normal  When waking her up this am  she was altered  cbg 55  was given D50 more alter  but speech is still sl;urred

## 2022-01-07 NOTE — ED Notes (Signed)
[name] made aware of patient BG. Patietn remains alert and oriented at this time.   Pt given juice and crackers and applesauce, plan to recheck BG in 15 min.

## 2022-01-07 NOTE — Assessment & Plan Note (Signed)
-   Holding home metformin and glimepiride on admission - Patient is on D10 infusion

## 2022-01-07 NOTE — Assessment & Plan Note (Addendum)
-   Atorvastatin 80 mg nightly resumed ?

## 2022-01-07 NOTE — Assessment & Plan Note (Addendum)
-   Magnesium check on admission is 1.7 - Potassium chloride 40 mill equivalents p.o. one-time dose ordered - Holding home hydrochlorothiazide 12.5 mg daily

## 2022-01-07 NOTE — Assessment & Plan Note (Addendum)
-   Left lateral foot wound with deepening orifice versus tracking sinus with bleeding and purulence - Cefepime and vancomycin per pharmacy - Podiatry has been consulted via secure chat, Dr. Joanie Coddington has acknowledged the consultation - Holding home Plavix at this time pending podiatry evaluation

## 2022-01-07 NOTE — Progress Notes (Addendum)
[name], [name] ([id number]) [id number] Page 1 of 8 Visit Report for 01/06/2022 Arrival Information Details Patient Name: Date of Service: PA [name] 01/06/2022 11:15 A M Medical Record Number: [id number] Patient Account Number: [id number] Date of Birth/Sex: Treating RN: 1933-09-27 (88 y.o. [name] Primary Care [name]: [name] Other Clinician: Massie [name] Referring [name]: Treating [name]/Extender: [name] in Treatment: 64 Visit Information History Since Last Visit All ordered tests and consults were completed: No Patient Arrived: Wheel Chair Added or deleted any medications: No Arrival Time: 11:22 Any new allergies or adverse reactions: No Transfer Assistance: EasyPivot Patient Lift Had a fall or experienced change in No Patient Identification Verified: Yes activities of daily living that may affect Secondary Verification Process Completed: Yes risk of falls: Patient Requires Transmission-Based Precautions: No Signs or symptoms of abuse/neglect since last visito No Patient Has Alerts: Yes Hospitalized since last visit: No Patient Alerts: Patient on Blood Thinner Implantable device outside of the clinic excluding No ABI 07/11/21 R 1.11 TBI 0.3 cellular tissue based products placed in the center ABI 07/11/21 L 1.09 TBI 0.3 since last visit: Has Dressing in Place as Prescribed: Yes Pain Present Now: Yes Electronic Signature(s) Signed: 01/06/2022 5:11:01 PM By: [name] Entered By: [name] on 01/06/2022 11:26:02 -------------------------------------------------------------------------------- Clinic Level of Care Assessment Details Patient Name: Date of Service: PA [name] 01/06/2022 11:15 A M Medical Record Number: [id number] Patient Account Number: [id number] Date of Birth/Sex: Treating RN: 04-09-1933 (88 y.o. [name] Primary Care [name]: [name] Other Clinician: Massie [name] Referring [name]: Treating [name]/Extender: [name] in Treatment: 22 Clinic Level of Care Assessment Items TOOL 1 Quantity Score []  - 0 Use when EandM and Procedure is performed on INITIAL visit ASSESSMENTS - Nursing Assessment / Reassessment []  - 0 General Physical Exam (combine w/ comprehensive assessment (listed just below) when performed on new pt. evals) []  - 0 Comprehensive Assessment (HX, ROS, Risk Assessments, Wounds Hx, etc.) [name] ([id number]) [phone].pdf Page 2 of 8 ASSESSMENTS - Wound and Skin Assessment / Reassessment []  - 0 Dermatologic / Skin Assessment (not related to wound area) ASSESSMENTS - Ostomy and/or Continence Assessment and Care []  - 0 Incontinence Assessment and Management []  - 0 Ostomy Care Assessment and Management (repouching, etc.) PROCESS - Coordination of Care []  - 0 Simple Patient / Family Education for ongoing care []  - 0 Complex (extensive) Patient / Family Education for ongoing care []  - 0 Staff obtains Programmer, systems, Records, T Results / Process Orders est []  - 0 Staff telephones HHA, Nursing Homes / Clarify orders / etc []  - 0 Routine Transfer to another Facility (non-emergent condition) []  - 0 Routine Hospital Admission (non-emergent condition) []  - 0 New Admissions / Biomedical engineer / Ordering NPWT Apligraf, etc. , []  - 0 Emergency Hospital Admission (emergent condition) PROCESS - Special Needs []  - 0 Pediatric / Minor Patient Management []  - 0 Isolation Patient Management []  - 0 Hearing / Language / Visual special needs []  - 0 Assessment of Community assistance (transportation, D/C planning, etc.) []  - 0 Additional assistance / Altered mentation []  - 0 Support Surface(s) Assessment (bed, cushion, seat, etc.) INTERVENTIONS - Miscellaneous []  - 0 External ear exam []  - 0 Patient Transfer (multiple staff / Librarian, academic / Similar devices) []  - 0 Simple Staple / Suture removal (25 or less) []  - 0 Complex Staple / Suture removal (26 or more) []  - 0 Hypo/Hyperglycemic Management (  do not check if billed separately) []  - 0 Ankle / Brachial Index (ABI) - do not check if billed separately Has the patient been seen at the hospital within the last three years: Yes Total Score: 0 Level Of Care: ____ Electronic Signature(s) Signed: 01/06/2022 5:11:01 PM By: 01/08/2022 Entered By: [name] on 01/06/2022 11:53:40 -------------------------------------------------------------------------------- Encounter Discharge Information Details Patient Name: Date of Service: [name], 01/08/2022. 01/06/2022 11:15 A M Medical Record Number: 01/08/2022 Patient Account Number: [id number] Date of Birth/Sex: Treating RN: April 26, 1933 (88 y.o. 83 Primary Care Naima Veldhuizen: [name] Other Clinician: Marcelino [name] Referring Daizha Anand: Treating Jerie Basford/Extender: [name] ([name]) [id number] Page 3 of 8 Weeks in Treatment: 22 Encounter Discharge Information Items Post Procedure Vitals Discharge Condition: Stable Temperature (F): 97.5 Ambulatory Status: Wheelchair Pulse (bpm): 76 Discharge Destination: Home Respiratory Rate (breaths/min): 16 Transportation: Private Auto Blood Pressure (mmHg): 151/73 Accompanied By: son Schedule Follow-up Appointment: Yes Clinical Summary of Care: Electronic Signature(s) Signed: 01/06/2022 5:11:01 PM By: 01/08/2022 Entered By: [name] on 01/06/2022 12:02:01 -------------------------------------------------------------------------------- Lower Extremity Assessment Details Patient Name: Date of Service: PA 01/08/2022 01/06/2022 11:15 A M Medical Record Number: 01/08/2022 Patient Account Number: [id number] Date of Birth/Sex: Treating RN: Nov 01, 1933 (88 y.o. 83 Primary  Care [name]: [name] Other Clinician: Marcelino [name] Referring [name]: Treating [name]/Extender: [name] in Treatment: 22 Vascular Assessment Pulses: Dorsalis Pedis Palpable: [Left:Yes] Electronic Signature(s) Signed: 01/06/2022 5:11:01 PM By: 01/08/2022 Signed: 01/07/2022 4:00:23 PM By: 01/09/2022 RN Entered By: Yevonne Pax on 01/06/2022 11:41:05 -------------------------------------------------------------------------------- Multi Wound Chart Details Patient Name: Date of Service: [name], 01/08/2022. 01/06/2022 11:15 A M Medical Record Number: 01/08/2022 Patient Account Number: [id number] Date of Birth/Sex: Treating RN: 02-14-1933 (88 y.o. 83 Primary Care Effa Yarrow: [name] Other Clinician: Marcelino [name] Referring Ricke Kimoto: Treating Milaya Hora/Extender: [name] in Treatment: [address] Vital Signs [place], [address] (Gabriela Eves) [id number] Page 4 of 8 Height(in): 64 Pulse(bpm): 76 Weight(lbs): Blood Pressure(mmHg): 151/73 Body Mass Index(BMI): Temperature(F): 97.5 Respiratory Rate(breaths/min): 16 [2:Photos:] [N/A:N/A] Left, Lateral Foot N/A N/A Wound Location: Gradually Appeared N/A N/A Wounding Event: Diabetic Wound/Ulcer of the Lower N/A N/A Primary Etiology: Extremity Hypertension, Type II Diabetes, N/A N/A Comorbid History: Osteoarthritis, Neuropathy 06/26/2021 N/A N/A Date Acquired: 22 N/A N/A Weeks of Treatment: Open N/A N/A Wound Status: No N/A N/A Wound Recurrence: 0.7x0.5x0.4 N/A N/A Measurements L x W x D (cm) 0.275 N/A N/A A (cm) : rea 0.11 N/A N/A Volume (cm) : 87.00% N/A N/A % Reduction in A rea: 48.10% N/A N/A % Reduction in Volume: Grade 3 N/A N/A Classification: Medium N/A N/A Exudate A mount: Serous N/A N/A Exudate Type: amber N/A N/A Exudate Color: Flat and Intact N/A N/A Wound Margin: Medium (34-66%) N/A  N/A Granulation A mount: Red N/A N/A Granulation Quality: Medium (34-66%) N/A N/A Necrotic A mount: Fat Layer (Subcutaneous Tissue): Yes N/A N/A Exposed Structures: Fascia: No Tendon: No Muscle: No Joint: No Bone: No None N/A N/A Epithelialization: Treatment Notes Electronic Signature(s) Signed: 01/06/2022 5:11:01 PM By: 01/08/2022 Entered By: [name] on 01/06/2022 11:41:16 -------------------------------------------------------------------------------- Multi-Disciplinary Care Plan Details Patient Name: Date of Service: [name], 01/08/2022. 01/06/2022 11:15 A M Medical Record Number: 01/08/2022 Patient Account Number: [id number] Date of Birth/Sex: Treating RN: 07-Oct-1933 (88 y.o. 83 Primary Care Eleisha Branscomb: [name] Other Clinician: Marcelino [name] Referring Sivan Cuello: Treating Rmani Kapusta/Extender: [name],  [place] in Treatment: [address], [name] ([id number]) [id number] Page 5 of 8 Active Inactive Electronic Signature(s) Signed: 02/17/2022 12:11:12 PM By: Gretta Cool, BSN, RN, CWS, [name] RN, BSN Signed: 03/06/2022 4:03:50 PM By: Carlene Coria RN Previous Signature: 01/06/2022 5:11:01 PM Version By: [name] Previous Signature: 01/07/2022 4:00:23 PM Version By: Carlene Coria RN Entered By: Gretta Cool, BSN, RN, CWS, [name] on 02/17/2022 12:11:11 -------------------------------------------------------------------------------- Pain Assessment Details Patient Name: Date of Service: PA [place], [place]. 01/06/2022 11:15 A M Medical Record Number: [id number] Patient Account Number: [id number] Date of Birth/Sex: Treating RN: Apr 20, 1933 (88 y.o. [name] Primary Care Trooper Olander: [name] Other Clinician: Massie [name] Referring Nemiah Bubar: Treating Kavontae Pritchard/Extender: [name] in Treatment: 22 Active Problems Location of Pain Severity and Description of Pain Patient Has Paino Yes Site  Locations Pain Location: Pain in Ulcers Duration of the Pain. Constant / Intermittento Intermittent Rate the pain. Current Pain Level: 0 Worst Pain Level: 7 Pain Management and Medication Current Pain Management: Medication: Yes Cold Application: No Rest: Yes Massage: No Activity: No T.E.N.S.: No Heat Application: No Leg drop or elevation: No Is the Current Pain Management Adequate: Inadequate How does your wound impact your activities of daily livingo Sleep: Yes Bathing: No Appetite: No Relationship With Others: No Bladder Continence: No Emotions: No Bowel Continence: No Work: No Toileting: No Drive: No Dressing: No Hobbies: No Notes most painful at night DARALYN, CHASON ([id number]) [id number] Page 6 of 8 Electronic Signature(s) Signed: 01/06/2022 5:11:01 PM By: [name] Signed: 01/07/2022 4:00:23 PM By: Carlene Coria RN Entered By: [name] on 01/06/2022 11:31:34 -------------------------------------------------------------------------------- Patient/Caregiver Education Details Patient Name: Date of Service: [name], [name] 11/27/2023andnbsp11:15 A M Medical Record Number: [id number] Patient Account Number: [id number] Date of Birth/Gender: Treating RN: February 03, 1934 (88 y.o. [name] Primary Care Physician: [name] Other Clinician: Massie [name] Referring Physician: Treating Physician/Extender: [name] in Treatment: 22 Education Assessment Education Provided To: Patient and Caregiver Education Topics Provided Wound/Skin Impairment: Handouts: Other: continue wound care as directed Methods: Explain/Verbal Responses: State content correctly Electronic Signature(s) Signed: 01/06/2022 5:11:01 PM By: [name] Entered By: [name] on 01/06/2022 11:54:06 -------------------------------------------------------------------------------- Wound Assessment Details Patient  Name: Date of Service: PA [name] 01/06/2022 11:15 A M Medical Record Number: [id number] Patient Account Number: [id number] Date of Birth/Sex: Treating RN: 1933/05/31 (88 y.o. [name] Primary Care Quinci Gavidia: [name] Other Clinician: Massie [name] Referring Lew Prout: Treating Glendon Dunwoody/Extender: [name] in Treatment: 22 Wound Status Wound Number: 2 Primary Etiology: Diabetic Wound/Ulcer of the Lower Extremity Wound Location: Left, Lateral Foot Wound Status: Open Wounding Event: Gradually Appeared Comorbid History: Hypertension, Type II Diabetes, Osteoarthritis, Neuropathy Date Acquired: 06/26/2021 [facility] Of Treatment: [address], [name] ([id number]) [id number] Page 7 of 8 Clustered Wound: No Photos Wound Measurements Length: (cm) 0.7 Width: (cm) 0.5 Depth: (cm) 0.4 Area: (cm) 0.275 Volume: (cm) 0.11 % Reduction in Area: 87% % Reduction in Volume: 48.1% Epithelialization: None Tunneling: No Undermining: No Wound Description Classification: Grade 3 Wound Margin: Flat and Intact Exudate Amount: Medium Exudate Type: Serous Exudate Color: amber Foul Odor After Cleansing: No Slough/Fibrino Yes Wound Bed Granulation Amount: Medium (34-66%) Exposed Structure Granulation Quality: Red Fascia Exposed: No Necrotic Amount: Medium (34-66%) Fat Layer (Subcutaneous Tissue) Exposed: Yes Necrotic Quality: Adherent Slough Tendon Exposed: No Muscle Exposed: No Joint Exposed: No Bone Exposed: No Electronic Signature(s) Signed: 01/06/2022 5:11:01 PM By: [name] Signed: 01/07/2022 4:00:23 PM  By: Carlene Coria RN Entered By: [name] on 01/06/2022 11:40:12 -------------------------------------------------------------------------------- Vitals Details Patient Name: Date of Service: PA [place], [name]. 01/06/2022 11:15 A M Medical Record Number: [id number] Patient Account Number: [id number] Date  of Birth/Sex: Treating RN: 1933-05-07 (88 y.o. [name] Primary Care Alizea Pell: [name] Other Clinician: Massie [name] Referring Sadhana Frater: Treating Julisa Flippo/Extender: [name] in Treatment: 22 Vital Signs Time Taken: 11:27 Temperature (F): 97.5 Height (in): 64 Pulse (bpm): 76 Respiratory Rate (breaths/min): 16 Blood Pressure (mmHg): 151/73 Reference Range: 80 - 120 mg / dl [place], Arena [name] ([id number]) [id number] Page 8 of 8 Electronic Signature(s) Signed: 01/06/2022 5:11:01 PM By: [name] Entered By: [name] on 01/06/2022 11:31:30

## 2022-01-07 NOTE — Assessment & Plan Note (Addendum)
-   Etiology workup in progress, possible cellulitis - Podiatry has been consulted to Dr. Ether Griffins via secure chat and has knowledge the consultation, as patient follows with Dr. Alberteen Spindle at [facility] podiatry - Check procalcitonin, sed rate - In a patient with diabetes mellitus type 2 and foot wound on the left, I have ordered vancomycin and cefepime per pharmacy - Telemetry medical, observation

## 2022-01-08 ENCOUNTER — Encounter: Payer: Self-pay | Admitting: Internal Medicine

## 2022-01-08 DIAGNOSIS — R809 Proteinuria, unspecified: Secondary | ICD-10-CM | POA: Diagnosis present

## 2022-01-08 DIAGNOSIS — E11621 Type 2 diabetes mellitus with foot ulcer: Secondary | ICD-10-CM | POA: Diagnosis present

## 2022-01-08 DIAGNOSIS — E782 Mixed hyperlipidemia: Secondary | ICD-10-CM | POA: Diagnosis present

## 2022-01-08 DIAGNOSIS — E1165 Type 2 diabetes mellitus with hyperglycemia: Secondary | ICD-10-CM | POA: Diagnosis present

## 2022-01-08 DIAGNOSIS — I69952 Hemiplegia and hemiparesis following unspecified cerebrovascular disease affecting left dominant side: Secondary | ICD-10-CM | POA: Diagnosis not present

## 2022-01-08 DIAGNOSIS — E1151 Type 2 diabetes mellitus with diabetic peripheral angiopathy without gangrene: Secondary | ICD-10-CM | POA: Diagnosis present

## 2022-01-08 DIAGNOSIS — E119 Type 2 diabetes mellitus without complications: Secondary | ICD-10-CM | POA: Diagnosis not present

## 2022-01-08 DIAGNOSIS — E871 Hypo-osmolality and hyponatremia: Secondary | ICD-10-CM | POA: Diagnosis present

## 2022-01-08 DIAGNOSIS — I69351 Hemiplegia and hemiparesis following cerebral infarction affecting right dominant side: Secondary | ICD-10-CM | POA: Diagnosis not present

## 2022-01-08 DIAGNOSIS — M1990 Unspecified osteoarthritis, unspecified site: Secondary | ICD-10-CM | POA: Diagnosis present

## 2022-01-08 DIAGNOSIS — Z96653 Presence of artificial knee joint, bilateral: Secondary | ICD-10-CM | POA: Diagnosis present

## 2022-01-08 DIAGNOSIS — Z79899 Other long term (current) drug therapy: Secondary | ICD-10-CM | POA: Diagnosis not present

## 2022-01-08 DIAGNOSIS — S81802A Unspecified open wound, left lower leg, initial encounter: Secondary | ICD-10-CM | POA: Diagnosis present

## 2022-01-08 DIAGNOSIS — S91302A Unspecified open wound, left foot, initial encounter: Secondary | ICD-10-CM | POA: Diagnosis not present

## 2022-01-08 DIAGNOSIS — R4781 Slurred speech: Secondary | ICD-10-CM | POA: Diagnosis present

## 2022-01-08 DIAGNOSIS — Z7984 Long term (current) use of oral hypoglycemic drugs: Secondary | ICD-10-CM | POA: Diagnosis not present

## 2022-01-08 DIAGNOSIS — G9341 Metabolic encephalopathy: Secondary | ICD-10-CM | POA: Diagnosis present

## 2022-01-08 DIAGNOSIS — M86172 Other acute osteomyelitis, left ankle and foot: Secondary | ICD-10-CM | POA: Diagnosis present

## 2022-01-08 DIAGNOSIS — L97522 Non-pressure chronic ulcer of other part of left foot with fat layer exposed: Secondary | ICD-10-CM | POA: Diagnosis not present

## 2022-01-08 DIAGNOSIS — E876 Hypokalemia: Secondary | ICD-10-CM | POA: Diagnosis present

## 2022-01-08 DIAGNOSIS — M869 Osteomyelitis, unspecified: Secondary | ICD-10-CM | POA: Diagnosis not present

## 2022-01-08 DIAGNOSIS — R404 Transient alteration of awareness: Secondary | ICD-10-CM | POA: Diagnosis present

## 2022-01-08 DIAGNOSIS — E162 Hypoglycemia, unspecified: Secondary | ICD-10-CM | POA: Diagnosis not present

## 2022-01-08 DIAGNOSIS — I10 Essential (primary) hypertension: Secondary | ICD-10-CM | POA: Diagnosis present

## 2022-01-08 DIAGNOSIS — E1169 Type 2 diabetes mellitus with other specified complication: Secondary | ICD-10-CM | POA: Diagnosis present

## 2022-01-08 DIAGNOSIS — E11649 Type 2 diabetes mellitus with hypoglycemia without coma: Secondary | ICD-10-CM | POA: Diagnosis present

## 2022-01-08 DIAGNOSIS — Z1152 Encounter for screening for COVID-19: Secondary | ICD-10-CM | POA: Diagnosis not present

## 2022-01-08 DIAGNOSIS — Z87440 Personal history of urinary (tract) infections: Secondary | ICD-10-CM | POA: Diagnosis not present

## 2022-01-08 DIAGNOSIS — Z888 Allergy status to other drugs, medicaments and biological substances status: Secondary | ICD-10-CM | POA: Diagnosis not present

## 2022-01-08 DIAGNOSIS — L97526 Non-pressure chronic ulcer of other part of left foot with bone involvement without evidence of necrosis: Secondary | ICD-10-CM | POA: Diagnosis present

## 2022-01-08 DIAGNOSIS — E1142 Type 2 diabetes mellitus with diabetic polyneuropathy: Secondary | ICD-10-CM | POA: Diagnosis present

## 2022-01-08 LAB — BASIC METABOLIC PANEL
Anion gap: 8 (ref 5–15)
BUN: 15 mg/dL (ref 8–23)
CO2: 30 mmol/L (ref 22–32)
Calcium: 8.2 mg/dL — ABNORMAL LOW (ref 8.9–10.3)
Chloride: 97 mmol/L — ABNORMAL LOW (ref 98–111)
Creatinine, Ser: 0.54 mg/dL (ref 0.44–1.00)
GFR, Estimated: >60 mL/min (ref >60)
Glucose, Bld: 246 mg/dL — ABNORMAL HIGH (ref 70–99)
Potassium: 2.8 mmol/L — ABNORMAL LOW (ref 3.5–5.1)
Sodium: 135 mmol/L (ref 135–145)

## 2022-01-08 LAB — CBC
HCT: 31.6 % — ABNORMAL LOW (ref 36.0–46.0)
Hemoglobin: 10.1 g/dL — ABNORMAL LOW (ref 12.0–15.0)
MCH: 29.8 pg (ref 26.0–34.0)
MCHC: 32 g/dL (ref 30.0–36.0)
MCV: 93.2 fL (ref 80.0–100.0)
Platelets: 174 10*3/uL (ref 150–400)
RBC: 3.39 MIL/uL — ABNORMAL LOW (ref 3.87–5.11)
RDW: 12.6 % (ref 11.5–15.5)
WBC: 11.8 10*3/uL — ABNORMAL HIGH (ref 4.0–10.5)
nRBC: 0 % (ref 0.0–0.2)

## 2022-01-08 LAB — SURGICAL PCR SCREEN
MRSA, PCR: NEGATIVE
Staphylococcus aureus: NEGATIVE

## 2022-01-08 LAB — CBG MONITORING, ED
Glucose-Capillary: 244 mg/dL — ABNORMAL HIGH (ref 70–99)
Glucose-Capillary: 357 mg/dL — ABNORMAL HIGH (ref 70–99)

## 2022-01-08 LAB — GLUCOSE, CAPILLARY
Glucose-Capillary: 70 mg/dL (ref 70–99)
Glucose-Capillary: 124 mg/dL — ABNORMAL HIGH (ref 70–99)
Glucose-Capillary: 193 mg/dL — ABNORMAL HIGH (ref 70–99)

## 2022-01-08 MED ORDER — SODIUM CHLORIDE 0.9 % IV SOLN: INTRAVENOUS | Status: DC

## 2022-01-08 MED ORDER — POTASSIUM CHLORIDE 20 MEQ PO PACK
40.0000 meq | PACK | Freq: Once | ORAL | Status: AC
  Administered 2022-01-08: 40 meq via ORAL
  Filled 2022-01-08: qty 2

## 2022-01-08 MED ORDER — POVIDONE-IODINE 10 % EX SWAB: 2.0000 | Freq: Once | CUTANEOUS | Status: DC

## 2022-01-08 MED ORDER — CHLORHEXIDINE GLUCONATE 4 % EX LIQD: 60.0000 mL | Freq: Once | CUTANEOUS | Status: DC

## 2022-01-08 MED ORDER — POTASSIUM CHLORIDE 20 MEQ PO PACK: 40.0000 meq | PACK | Freq: Once | ORAL | Status: DC

## 2022-01-08 NOTE — Progress Notes (Signed)
Blood sugar 70, juice just given with potassium. Juice repeated , patient mental status unchanged.

## 2022-01-08 NOTE — ED Notes (Signed)
Pt in bed, pt has voided, cleaned pt and changed bedding, pt finished most of her meal tray, pt is confused, but oriented to person, place and time.  Pt reports some leg pain, pt has two wounds on her L foot and leg.

## 2022-01-08 NOTE — Progress Notes (Signed)
Blood sugar rechecked now 124. Ate small amount of supper and drank ensure,

## 2022-01-08 NOTE — Progress Notes (Signed)
Patient confused unable to answer appropriatley

## 2022-01-08 NOTE — ED Notes (Signed)
Pt in bed, pt denies pain, pt awaits admission, meal tray at bedside.

## 2022-01-08 NOTE — Progress Notes (Signed)
Wound to left foot cleansed with NS and foam dressing applied, tolerated well. MRSA of nares obtained and sent to lab

## 2022-01-08 NOTE — Progress Notes (Signed)
Md here patient examined, repositioned. States she cannot stay here tonight has babies to take care of at home.

## 2022-01-08 NOTE — ED Notes (Signed)
Bishop Limbo, NP messaged regarding patient's last couple of BP's and how they are getting lower than during the day. Pt still alert, no changes in cognition.

## 2022-01-08 NOTE — ED Notes (Signed)
ED TO INPATIENT HANDOFF REPORT  ED Nurse Name and Phone #: Ned Clines Name/Age/Gender [name] 88 y.o. female Room/Bed: ED04A/ED04A  Code Status   Code Status: Full Code  Home/SNF/Other Home Patient oriented to: self, place, and time Is this baseline? Yes   Triage Complete: Triage complete  Chief Complaint Hypoglycemia [E16.2]  Triage Note Presents from home via EMS   Family saw her last pm  she was normal  When waking her up this am  she was altered  cbg 55  was given D50 more alter  but speech is still sl;urred  Pt brought in from home with co altered mental status, per EMS report symptoms started at 2130 yesterday. Pt had CBG of 55 and had D50. IV placed 22g to left AC>    Allergies Allergies  Allergen Reactions   Metformin And Related Nausea And Vomiting    Level of Care/Admitting Diagnosis ED Disposition     ED Disposition  Admit   Condition  --   Comment  Hospital Area: [facility] [[id number]]  Level of Care: Telemetry Medical [[id number]]  Covid Evaluation: Confirmed COVID Negative  Diagnosis: Hypoglycemia [937169]  Admitting Physician: Lovenia [name] [[id number]]  Attending Physician: COX, AMY N [id number]          B Medical/Surgery History Past Medical History:  Diagnosis Date   Carpal tunnel syndrome on both sides    Diabetes (HCC)    Diabetic polyneuropathy (HCC)    Diverticulitis    Hyperlipidemia    Hypertension    Osteoarthritis    Vaginal prolapse    Past Surgical History:  Procedure Laterality Date   CESAREAN SECTION     LOWER EXTREMITY ANGIOGRAPHY Left 06/11/2021   Procedure: Lower Extremity Angiography;  Surgeon: Renford Dills, MD;  Location: [facility] INVASIVE CV LAB;  Service: Cardiovascular;  Laterality: Left;   pessary     REPLACEMENT TOTAL KNEE BILATERAL     VENTRAL HERNIA REPAIR     VENTRAL HERNIA REPAIR N/A 01/13/2016   Procedure: HERNIA REPAIR VENTRAL ADULT WITH SMALL BOWEL RESECTION, LYSIS OF ADHESIONS;   Surgeon: Gladis Riffle, MD;  Location: [facility] ORS;  Service: General;  Laterality: N/A;     A IV Location/Drains/Wounds Patient Lines/Drains/Airways Status     Active Line/Drains/Airways     Name Placement date Placement time Site Days   Peripheral IV 01/07/22 20 G 1" Right Antecubital 01/07/22  2042  Antecubital  1   External Urinary Catheter 06/01/21  1158  --  221   Incision (Closed) 01/14/16 Abdomen 01/14/16  0157  -- 2186   Pressure Injury 06/01/21 Hip Proximal;Right;Lateral Stage 2 -  Partial thickness loss of dermis presenting as a shallow open injury with a red, pink wound bed without slough. Small Red area with open tear approx 3 cm x 3cm 06/01/21  1704  -- 221            Intake/Output Last 24 hours  Intake/Output Summary (Last 24 hours) at 01/08/2022 1307 Last data filed at 01/08/2022 1137 Gross per 24 hour  Intake 2113.66 ml  Output --  Net 2113.66 ml    Labs/Imaging Results for orders placed or performed during the hospital encounter of 01/07/22 (from the past 48 hour(s))  CBG monitoring, ED     Status: Abnormal   Collection Time: 01/07/22 10:38 AM  Result Value Ref Range   Glucose-Capillary 209 (H) 70 - 99 mg/dL    Comment: Glucose reference range applies only  to samples taken after fasting for at least 8 hours.  Comprehensive metabolic panel     Status: Abnormal   Collection Time: 01/07/22 10:44 AM  Result Value Ref Range   Sodium 138 135 - 145 mmol/L   Potassium 3.0 (L) 3.5 - 5.1 mmol/L   Chloride 99 98 - 111 mmol/L   CO2 29 22 - 32 mmol/L   Glucose, Bld 245 (H) 70 - 99 mg/dL    Comment: Glucose reference range applies only to samples taken after fasting for at least 8 hours.   BUN 16 8 - 23 mg/dL   Creatinine, Ser 4.09 0.44 - 1.00 mg/dL   Calcium 8.8 (L) 8.9 - 10.3 mg/dL   Total Protein 7.2 6.5 - 8.1 g/dL   Albumin 3.7 3.5 - 5.0 g/dL   AST 42 (H) 15 - 41 U/L   ALT 17 0 - 44 U/L   Alkaline Phosphatase 76 38 - 126 U/L   Total Bilirubin 1.4 (H)  0.3 - 1.2 mg/dL   GFR, Estimated >81 >19 mL/min    Comment: (NOTE) Calculated using the CKD-EPI Creatinine Equation (2021)    Anion gap 10 5 - 15    Comment: Performed at [facility], [address] [place], [place] [address]  Lactic acid, plasma     Status: None   Collection Time: 01/07/22 10:44 AM  Result Value Ref Range   Lactic Acid, Venous 1.9 0.5 - 1.9 mmol/L    Comment: Performed at [facility], [address], [place], [place] [address]  Troponin I (High Sensitivity)     Status: None   Collection Time: 01/07/22 10:44 AM  Result Value Ref Range   Troponin I (High Sensitivity) 9 <18 ng/L    Comment: (NOTE) Elevated high sensitivity troponin I (hsTnI) values and significant  changes across serial measurements may suggest ACS but many other  chronic and acute conditions are known to elevate hsTnI results.  Refer to the "Links" section for chest pain algorithms and additional  guidance. Performed at [facility], [address] [place], [place] [address]   Resp Panel by RT-PCR (Flu A&B, Covid) Anterior Nasal Swab     Status: None   Collection Time: 01/07/22 10:44 AM   Specimen: Anterior Nasal Swab  Result Value Ref Range   SARS Coronavirus 2 by RT PCR NEGATIVE NEGATIVE    Comment: (NOTE) SARS-CoV-2 target nucleic acids are NOT DETECTED.  The SARS-CoV-2 RNA is generally detectable in upper respiratory specimens during the acute phase of infection. The lowest concentration of SARS-CoV-2 viral copies this assay can detect is 138 copies/mL. A negative result does not preclude SARS-Cov-2 infection and should not be used as the sole basis for treatment or other patient management decisions. A negative result may occur with  improper specimen collection/handling, submission of specimen other than nasopharyngeal swab, presence of viral mutation(s) within the areas targeted by this assay, and inadequate number of viral copies(<138 copies/mL). A  negative result must be combined with clinical observations, patient history, and epidemiological information. The expected result is Negative.  Fact Sheet for Patients:  BloggerCourse.com  Fact Sheet for Healthcare Providers:  SeriousBroker.it  This test is no t yet approved or cleared by the [place] FDA and  has been authorized for detection and/or diagnosis of SARS-CoV-2 by FDA under an Emergency Use Authorization (EUA). This EUA will remain  in effect (meaning this test can be used) for the duration of the COVID-19 declaration under Section 564(b)(1)  of the Act, 21 U.S.C.section 360bbb-3(b)(1), unless the authorization is terminated  or revoked sooner.       Influenza A by PCR NEGATIVE NEGATIVE   Influenza B by PCR NEGATIVE NEGATIVE    Comment: (NOTE) The Xpert Xpress SARS-CoV-2/FLU/RSV plus assay is intended as an aid in the diagnosis of influenza from Nasopharyngeal swab specimens and should not be used as a sole basis for treatment. Nasal washings and aspirates are unacceptable for Xpert Xpress SARS-CoV-2/FLU/RSV testing.  Fact Sheet for Patients: BloggerCourse.com  Fact Sheet for Healthcare Providers: SeriousBroker.it  This test is not yet approved or cleared by the [place] FDA and has been authorized for detection and/or diagnosis of SARS-CoV-2 by FDA under an Emergency Use Authorization (EUA). This EUA will remain in effect (meaning this test can be used) for the duration of the COVID-19 declaration under Section 564(b)(1) of the Act, 21 U.S.C. section 360bbb-3(b)(1), unless the authorization is terminated or revoked.  Performed at [facility], [address] [place], [place] [address]   CBC with Differential     Status: Abnormal   Collection Time: 01/07/22 10:44 AM  Result Value Ref Range   WBC 12.1 (H) 4.0 - 10.5 K/uL   RBC 4.05 3.87 -  5.11 MIL/uL   Hemoglobin 12.4 12.0 - 15.0 g/dL   HCT 60.4 54.0 - 98.1 %   MCV 94.6 80.0 - 100.0 fL   MCH 30.6 26.0 - 34.0 pg   MCHC 32.4 30.0 - 36.0 g/dL   RDW 19.1 47.8 - 29.5 %   Platelets 208 150 - 400 K/uL   nRBC 0.0 0.0 - 0.2 %   Neutrophils Relative % 86 %   Neutro Abs 10.4 (H) 1.7 - 7.7 K/uL   Lymphocytes Relative 8 %   Lymphs Abs 1.0 0.7 - 4.0 K/uL   Monocytes Relative 5 %   Monocytes Absolute 0.6 0.1 - 1.0 K/uL   Eosinophils Relative 0 %   Eosinophils Absolute 0.0 0.0 - 0.5 K/uL   Basophils Relative 0 %   Basophils Absolute 0.0 0.0 - 0.1 K/uL   Immature Granulocytes 1 %   Abs Immature Granulocytes 0.07 0.00 - 0.07 K/uL    Comment: Performed at [facility], [address] [place], [place] [address]  Magnesium     Status: None   Collection Time: 01/07/22 10:52 AM  Result Value Ref Range   Magnesium 1.7 1.7 - 2.4 mg/dL    Comment: Performed at [facility], [address] [place], [place] [address]  Urinalysis, Routine w reflex microscopic Urine, Catheterized     Status: Abnormal   Collection Time: 01/07/22  4:45 PM  Result Value Ref Range   Color, Urine YELLOW (A) YELLOW   APPearance HAZY (A) CLEAR   Specific Gravity, Urine 1.019 1.005 - 1.030   pH 5.0 5.0 - 8.0   Glucose, UA 150 (A) NEGATIVE mg/dL   Hgb urine dipstick NEGATIVE NEGATIVE   Bilirubin Urine NEGATIVE NEGATIVE   Ketones, ur NEGATIVE NEGATIVE mg/dL   Protein, ur 30 (A) NEGATIVE mg/dL   Nitrite NEGATIVE NEGATIVE   Leukocytes,Ua NEGATIVE NEGATIVE   RBC / HPF 0-5 0 - 5 RBC/hpf   WBC, UA 0-5 0 - 5 WBC/hpf   Bacteria, UA NONE SEEN NONE SEEN   Squamous Epithelial / LPF 0-5 0 - 5   Mucus PRESENT    Budding Yeast PRESENT     Comment: Performed at [facility], [address] [place], [place] [address]  CBG monitoring, ED     Status: Abnormal   Collection Time: 01/07/22  5:00 PM  Result Value Ref Range   Glucose-Capillary 42 (LL) 70 - 99 mg/dL    Comment: Glucose reference  range applies only to samples taken after fasting for at least 8 hours.   Comment 1 Call MD NNP PA CNM   CBG monitoring, ED     Status: Abnormal   Collection Time: 01/07/22  5:16 PM  Result Value Ref Range   Glucose-Capillary 50 (L) 70 - 99 mg/dL    Comment: Glucose reference range applies only to samples taken after fasting for at least 8 hours.  CBG monitoring, ED     Status: Abnormal   Collection Time: 01/07/22  5:41 PM  Result Value Ref Range   Glucose-Capillary 103 (H) 70 - 99 mg/dL    Comment: Glucose reference range applies only to samples taken after fasting for at least 8 hours.  CBG monitoring, ED     Status: Abnormal   Collection Time: 01/07/22  6:30 PM  Result Value Ref Range   Glucose-Capillary 158 (H) 70 - 99 mg/dL    Comment: Glucose reference range applies only to samples taken after fasting for at least 8 hours.  CBG monitoring, ED     Status: Abnormal   Collection Time: 01/07/22  8:08 PM  Result Value Ref Range   Glucose-Capillary 187 (H) 70 - 99 mg/dL    Comment: Glucose reference range applies only to samples taken after fasting for at least 8 hours.  Phosphorus     Status: Abnormal   Collection Time: 01/07/22  8:11 PM  Result Value Ref Range   Phosphorus 2.4 (L) 2.5 - 4.6 mg/dL    Comment: Performed at [facility], [address] [place], [place] [address]  Procalcitonin - Baseline     Status: None   Collection Time: 01/07/22  8:11 PM  Result Value Ref Range   Procalcitonin <0.10 ng/mL    Comment:        Interpretation: PCT (Procalcitonin) <= 0.5 ng/mL: Systemic infection (sepsis) is not likely. Local bacterial infection is possible. (NOTE)       Sepsis PCT Algorithm           Lower Respiratory Tract                                      Infection PCT Algorithm    ----------------------------     ----------------------------         PCT < 0.25 ng/mL                PCT < 0.10 ng/mL          Strongly encourage             Strongly discourage    discontinuation of antibiotics    initiation of antibiotics    ----------------------------     -----------------------------       PCT 0.25 - 0.50 ng/mL            PCT 0.10 - 0.25 ng/mL               OR       >80% decrease in PCT            Discourage initiation of  antibiotics      Encourage discontinuation           of antibiotics    ----------------------------     -----------------------------         PCT >= 0.50 ng/mL              PCT 0.26 - 0.50 ng/mL               AND        <80% decrease in PCT             Encourage initiation of                                             antibiotics       Encourage continuation           of antibiotics    ----------------------------     -----------------------------        PCT >= 0.50 ng/mL                  PCT > 0.50 ng/mL               AND         increase in PCT                  Strongly encourage                                      initiation of antibiotics    Strongly encourage escalation           of antibiotics                                     -----------------------------                                           PCT <= 0.25 ng/mL                                                 OR                                        > 80% decrease in PCT                                      Discontinue / Do not initiate                                             antibiotics  Performed at [facility], [address] [place], [place] [address]   Sedimentation rate     Status: Abnormal   Collection Time: 01/07/22  8:11 PM  Result Value Ref Range   Sed Rate 33 (H) 0 - 30 mm/hr    Comment: Performed at [facility], [address] [place], [place] [address]  CBG monitoring, ED     Status: Abnormal   Collection Time: 01/07/22 10:14 PM  Result Value Ref Range   Glucose-Capillary 227 (H) 70 - 99 mg/dL    Comment: Glucose reference range applies only to samples taken after  fasting for at least 8 hours.  Basic metabolic panel     Status: Abnormal   Collection Time: 01/08/22  5:25 AM  Result Value Ref Range   Sodium 135 135 - 145 mmol/L   Potassium 2.8 (L) 3.5 - 5.1 mmol/L   Chloride 97 (L) 98 - 111 mmol/L   CO2 30 22 - 32 mmol/L   Glucose, Bld 246 (H) 70 - 99 mg/dL    Comment: Glucose reference range applies only to samples taken after fasting for at least 8 hours.   BUN 15 8 - 23 mg/dL   Creatinine, Ser 0.98 0.44 - 1.00 mg/dL   Calcium 8.2 (L) 8.9 - 10.3 mg/dL   GFR, Estimated >11 >91 mL/min    Comment: (NOTE) Calculated using the CKD-EPI Creatinine Equation (2021)    Anion gap 8 5 - 15    Comment: Performed at [facility], [address] [place], [place] [address]  CBC     Status: Abnormal   Collection Time: 01/08/22  5:25 AM  Result Value Ref Range   WBC 11.8 (H) 4.0 - 10.5 K/uL   RBC 3.39 (L) 3.87 - 5.11 MIL/uL   Hemoglobin 10.1 (L) 12.0 - 15.0 g/dL   HCT 56.2 (L) 13.0 - 86.5 %   MCV 93.2 80.0 - 100.0 fL   MCH 29.8 26.0 - 34.0 pg   MCHC 32.0 30.0 - 36.0 g/dL   RDW 78.4 69.6 - 29.5 %   Platelets 174 150 - 400 K/uL   nRBC 0.0 0.0 - 0.2 %    Comment: Performed at [facility], [address] [place], [place] [address]  CBG monitoring, ED     Status: Abnormal   Collection Time: 01/08/22  7:18 AM  Result Value Ref Range   Glucose-Capillary 244 (H) 70 - 99 mg/dL    Comment: Glucose reference range applies only to samples taken after fasting for at least 8 hours.  CBG monitoring, ED     Status: Abnormal   Collection Time: 01/08/22 11:35 AM  Result Value Ref Range   Glucose-Capillary 357 (H) 70 - 99 mg/dL    Comment: Glucose reference range applies only to samples taken after fasting for at least 8 hours.   DG Foot Complete Left  Result Date: 01/07/2022 CLINICAL DATA:  Diabetic foot wound. EXAM: LEFT FOOT - COMPLETE 3+ VIEW COMPARISON:  November 13, 2021. FINDINGS: There is no evidence of fracture or dislocation. No lytic  destruction is seen to suggest osteomyelitis. Vascular calcifications are noted. Soft tissue ulceration is again noted lateral to the proximal base of the fifth metatarsal. IMPRESSION: Lateral soft tissue ulceration is again noted. No definite lytic destruction is seen to suggest osteomyelitis. Electronically Signed   By: Lupita Raider M.D.   On: 01/07/2022 16:31   DG Chest 2 View  Result Date: 01/07/2022 CLINICAL DATA:  Altered mental status in a female at age 75. EXAM: CHEST - 2 VIEW COMPARISON:  September 20, 2019. FINDINGS: Image rotated to the LEFT. Accounting for this  cardiomediastinal contours and hilar structures are stable. Lungs are clear. No sign of pleural effusion.  No pneumothorax. On limited assessment no acute skeletal process. Marked glenohumeral degenerative changes. IMPRESSION: 1. No acute cardiopulmonary disease. 2. Marked glenohumeral degenerative changes. Electronically Signed   By: Donzetta Kohut M.D.   On: 01/07/2022 11:33   CT HEAD WO CONTRAST ( )  Result Date: 01/07/2022 CLINICAL DATA:  Mental status change, unknown cause, slurred speech EXAM: CT HEAD WITHOUT CONTRAST TECHNIQUE: Contiguous axial images were obtained from the base of the skull through the vertex without intravenous contrast. RADIATION DOSE REDUCTION: This exam was performed according to the departmental dose-optimization program which includes automated exposure control, adjustment of the mA and/or kV according to patient size and/or use of iterative reconstruction technique. COMPARISON:  06/01/2021 FINDINGS: Brain: Normal anatomic configuration. Parenchymal volume loss is commensurate with the patient's age. Moderate periventricular white matter changes are present likely reflecting the sequela of small vessel ischemia. 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 No abnormal intra or extra-axial mass lesion or fluid  collection. No abnormal mass effect or midline shift. No evidence of acute intracranial hemorrhage or infarct. Ventricular size is normal. Cerebellum unremarkable. Vascular: No asymmetric hyperdense vasculature at the skull base. Skull: Intact Sinuses/Orbits: Paranasal sinuses are clear. Ocular lenses have been removed. Orbits are otherwise unremarkable. Other: Mastoid air cells and middle ear cavities are clear. Retained metallic foreign body again seen within the soft tissues superomedial to the right orbit. IMPRESSION: 1. No acute intracranial hemorrhage or infarct. 2. Stable senescent changes and remote infarcts. 3. Stable retained metallic foreign body within the soft tissues superomedial to the right orbit. Electronically Signed   By: Helyn Numbers M.D.   On: 01/07/2022 11:15    Pending Labs Unresulted Labs (From admission, onward)     Start     Ordered   01/08/22 0500  Hemoglobin A1c  Tomorrow morning,   R       Comments: To assess prior glycemic control    01/07/22 1817   01/07/22 2018  Surgical pcr screen  ONCE - URGENT,   R        01/07/22 2020            Vitals/Pain Today's Vitals   01/08/22 0645 01/08/22 0848 01/08/22 1210 01/08/22 1307  BP: (!) 118/56 116/67 (!) 152/75 110/82  Pulse: 92 84 93 98  Resp: Temp:  (!) 97.2 F (36.2 C) (!) 97.4 F (36.3 C) (!) 97.5 F (36.4 C)  TempSrc:  Oral Oral Oral  SpO2: 96% 97% 96% 100%  Weight:      Height:      PainSc:  3  0-No pain 0-No pain    Isolation Precautions No active isolations  Medications Medications  dextrose 10 % infusion (0 mLs Intravenous Stopped 01/08/22 1137)  acetaminophen (TYLENOL) tablet 650 mg (650 mg Oral Given 01/07/22 1913)    Or  acetaminophen (TYLENOL) suppository 650 mg ( Rectal See Alternative 01/07/22 1913)  ondansetron (ZOFRAN) tablet 4 mg (has no administration in time range)    Or  ondansetron (ZOFRAN) injection 4 mg (has no administration in time range)  senna-docusate  (Senokot-S) tablet 1 tablet (1 tablet Oral Given 01/08/22 0657)  insulin aspart (novoLOG) injection 0-9 Units (9 Units Subcutaneous Given 01/08/22 1208)  insulin aspart (novoLOG) injection 0-5 Units (2 Units Subcutaneous Given 01/07/22 2219)  hydrALAZINE (APRESOLINE) tablet 10  mg (has no administration in time range)  atorvastatin (LIPITOR) tablet 80 mg (80 mg Oral Given 01/07/22 2222)  lisinopril (ZESTRIL) tablet 5 mg (5 mg Oral Given 01/08/22 0903)  heparin injection 5,000 Units (5,000 Units Subcutaneous Given 01/08/22 0529)  ceFEPIme (MAXIPIME) 2 g in sodium chloride 0.9 % 100 mL IVPB (0 g Intravenous Stopped 01/08/22 1124)  vancomycin (VANCOCIN) IVPB 1000 mg/200 mL premix (has no administration in time range)  potassium chloride (KLOR-CON) packet 40 mEq (has no administration in time range)  dextrose 50 % solution 50 mL (50 mLs Intravenous Given 01/07/22 1720)  potassium chloride SA (KLOR-CON M) CR tablet 40 mEq (40 mEq Oral Given 01/07/22 1914)  ceFEPIme (MAXIPIME) 2 g in sodium chloride 0.9 % 100 mL IVPB (0 g Intravenous Stopped 01/07/22 2200)  vancomycin (VANCOREADY) IVPB 1250 mg/250 mL (0 mg Intravenous Stopped 01/08/22 0145)  potassium chloride (KLOR-CON) packet 40 mEq (40 mEq Oral Given 01/08/22 1132)    Mobility walks with device High fall risk     R Recommendations: See Admitting Provider Note  Report given to:   Additional Notes: Pt knows the day of the week and where she is, but is still confused.

## 2022-01-08 NOTE — Progress Notes (Addendum)
PROGRESS NOTE    [name]  JME:[id number] DOB: 17-Apr-1933 DOA: 01/07/2022 PCP: Gracelyn Nurse, MD   Brief Narrative:  This 88 years old female with PMH significant for non-insulin-dependent diabetes mellitus, hyperlipidemia, hypertension presented in the ED with altered mental status.  Work-up in the ED CT head no acute intracranial abnormality noted.  UA unremarkable.  Patient did not take her diabetic medications last couple of days.  She was found to be hypoglycemic. She is admitted for further evaluation.  Assessment & Plan:   Principal Problem:   Hypoglycemia Active Problems:   Type 2 diabetes mellitus with polyneuropathy (HCC)   Mixed hyperlipidemia   Essential hypertension   Altered mental status   Hypokalemia   PAD (peripheral artery disease) (HCC)   Type 2 diabetes mellitus with microalbuminuria, without long-term current use of insulin (HCC)   Leukocytosis   MRI contraindicated due to metal implant   Open wound of left foot  Metabolic Encephalopathy: Likely Hypoglycemia: Could be secondary to glimepiride. Holding on admission. Initiated on D10 infusion.  Now blood glucose has improved. D10 infusion discontinued.  Started on IV saline. CT head no acute intracranial abnormality. UA: unremarkable.   Open wound of left foot: Left lateral foot wound with deepening orifice versus tracking sinus with bleeding and purulence Initiated on empiric cefepime and vancomycin. Podiatry Dr. Joanie Coddington was notified for consult. Holding home Plavix at this time pending podiatry evaluation   Retained foreign body: Patient has a retained metallic foreign body within the soft tissue at the superioromedial right orbit. MRI contraindicated because of the metallic foreign body.   Leukocytosis: Likely secondary to cellulitis. Podiatry has been consulted to Dr. Ether Griffins  Patient follows with Dr. Alberteen Spindle at [facility] Continue vancomycin and cefepime.  DM II with  microalbuminuria: Holding home metformin and glimepiride. Blood glucose has improved now.   Peripheral arterial disease: Continue Plavix 75 mg daily   Hypokalemia Replaced.  Continue to monitor   Essential hypertension: Continue lisinopril 5 mg daily. Hold HCTZ due to hypokalemia. Continue hydralazine 10 mg IV every 6 hours as needed.   Hyperlipidemia: Continue Lipitor   DVT prophylaxis: Heparin Code Status: Full code Family Communication: No family at bedside Disposition Plan:   Status is: Observation The patient remains OBS appropriate and will d/c before 2 midnights.   Admitted for altered mental status likely secondary to hypoglycemic episodes.   Consultants:  Podiatry  Procedures: None Antimicrobials:  Anti-infectives (From admission, onward)    Start     Dose/Rate Route Frequency Ordered Stop   01/08/22 2100  vancomycin (VANCOCIN) IVPB 1000 mg/200 mL premix        1,000 mg 200 mL/hr over 60 Minutes Intravenous Every 24 hours 01/07/22 2058     01/08/22 0900  ceFEPIme (MAXIPIME) 2 g in sodium chloride 0.9 % 100 mL IVPB        2 g 200 mL/hr over 30 Minutes Intravenous Every 12 hours 01/07/22 2039     01/07/22 2045  vancomycin (VANCOREADY) IVPB 1250 mg/250 mL        1,250 mg 166.7 mL/hr over 90 Minutes Intravenous  Once 01/07/22 2037 01/08/22 0145   01/07/22 2030  vancomycin (VANCOCIN) IVPB 1000 mg/200 mL premix  Status:  Discontinued        1,000 mg 200 mL/hr over 60 Minutes Intravenous  Once 01/07/22 2020 01/07/22 2036   01/07/22 2030  ceFEPIme (MAXIPIME) 2 g in sodium chloride 0.9 % 100 mL IVPB  2 g 200 mL/hr over 30 Minutes Intravenous  Once 01/07/22 2020 01/07/22 2200   01/07/22 1845  cefTRIAXone (ROCEPHIN) 1 g in sodium chloride 0.9 % 100 mL IVPB  Status:  Discontinued        1 g 200 mL/hr over 30 Minutes Intravenous  Once 01/07/22 1831 01/07/22 2008   01/07/22 1830  cefTRIAXone (ROCEPHIN) 1 g in sodium chloride 0.9 % 100 mL IVPB  Status:   Discontinued        1 g 200 mL/hr over 30 Minutes Intravenous Every 24 hours 01/07/22 1830 01/07/22 1831       Subjective: Patient seen and examined at bedside.  Overnight events noted. Patient appears back to her baseline mental status.  She denies any pain.  Objective: Vitals:   01/08/22 1210 01/08/22 1307 01/08/22 1412 01/08/22 1451  BP: (!) 152/75 110/82 119/79 113/61  Pulse: 93 98 96 85  Resp: 17 17 17 18   Temp: (!) 97.4 F (36.3 C) (!) 97.5 F (36.4 C) (!) 97.4 F (36.3 C) 98 F (36.7 C)  TempSrc: Oral Oral Oral   SpO2: 96% 100% 99% 99%  Weight:      Height:        Intake/Output Summary (Last 24 hours) at 01/08/2022 1526 Last data filed at 01/08/2022 1137 Gross per 24 hour  Intake 2113.66 ml  Output --  Net 2113.66 ml   Filed Weights   01/07/22 2031  Weight: 63.5 kg    Examination:  General exam: Appears comfortable, not in any acute distress.  Deconditioned Respiratory system: CTA bilaterally, respiratory effort normal, RR 15 Cardiovascular system: S1 & S2 heard, regular rate and rhythm, no murmur. Gastrointestinal system: Abdomen is soft, nontender, nondistended, BS+ Extremities:open wound on left foot, no drainage Skin: No rashes, lesions or ulcers Psychiatry: Judgement and insight appear normal. Mood & affect appropriate.     Data Reviewed: I have personally reviewed following labs and imaging studies  CBC: Recent Labs  Lab 01/07/22 1044 01/08/22 0525  WBC 12.1* 11.8*  NEUTROABS 10.4*  --   HGB 12.4 10.1*  HCT 38.3 31.6*  MCV 94.6 93.2  PLT 208 174   Basic Metabolic Panel: Recent Labs  Lab 01/07/22 1044 01/07/22 1052 01/07/22 2011 01/08/22 0525  NA 138  --   --  135  K 3.0*  --   --  2.8*  CL 99  --   --  97*  CO2 29  --   --  30  GLUCOSE 245*  --   --  246*  BUN 16  --   --  15  CREATININE 0.62  --   --  0.54  CALCIUM 8.8*  --   --  8.2*  MG  --  1.7  --   --   PHOS  --   --  2.4*  --    GFR: Estimated Creatinine  Clearance: 44.4 mL/min (by C-G formula based on SCr of 0.54 mg/dL). Liver Function Tests: Recent Labs  Lab 01/07/22 1044  AST 42*  ALT 17  ALKPHOS 76  BILITOT 1.4*  PROT 7.2  ALBUMIN 3.7   No results for input(s): "LIPASE", "AMYLASE" in the last 168 hours. No results for input(s): "AMMONIA" in the last 168 hours. Coagulation Profile: No results for input(s): "INR", "PROTIME" in the last 168 hours. Cardiac Enzymes: No results for input(s): "CKTOTAL", "CKMB", "CKMBINDEX", "TROPONINI" in the last 168 hours. BNP (last 3 results) No results for input(s): "PROBNP" in the last 8760  hours. HbA1C: No results for input(s): "HGBA1C" in the last 72 hours. CBG: Recent Labs  Lab 01/07/22 1830 01/07/22 2008 01/07/22 2214 01/08/22 0718 01/08/22 1135  GLUCAP 158* 187* 227* 244* 357*   Lipid Profile: No results for input(s): "CHOL", "HDL", "LDLCALC", "TRIG", "CHOLHDL", "LDLDIRECT" in the last 72 hours. Thyroid Function Tests: No results for input(s): "TSH", "T4TOTAL", "FREET4", "T3FREE", "THYROIDAB" in the last 72 hours. Anemia Panel: No results for input(s): "VITAMINB12", "FOLATE", "FERRITIN", "TIBC", "IRON", "RETICCTPCT" in the last 72 hours. Sepsis Labs: Recent Labs  Lab 01/07/22 1044 01/07/22 2011  PROCALCITON  --  <0.10  LATICACIDVEN 1.9  --     Recent Results (from the past 240 hour(s))  Resp Panel by RT-PCR (Flu A&B, Covid) Anterior Nasal Swab     Status: None   Collection Time: 01/07/22 10:44 AM   Specimen: Anterior Nasal Swab  Result Value Ref Range Status   SARS Coronavirus 2 by RT PCR NEGATIVE NEGATIVE Final    Comment: (NOTE) SARS-CoV-2 target nucleic acids are NOT DETECTED.  The SARS-CoV-2 RNA is generally detectable in upper respiratory specimens during the acute phase of infection. The lowest concentration of SARS-CoV-2 viral copies this assay can detect is 138 copies/mL. A negative result does not preclude SARS-Cov-2 infection and should not be used as the  sole basis for treatment or other patient management decisions. A negative result may occur with  improper specimen collection/handling, submission of specimen other than nasopharyngeal swab, presence of viral mutation(s) within the areas targeted by this assay, and inadequate number of viral copies(<138 copies/mL). A negative result must be combined with clinical observations, patient history, and epidemiological information. The expected result is Negative.  Fact Sheet for Patients:  BloggerCourse.com  Fact Sheet for Healthcare Providers:  SeriousBroker.it  This test is no t yet approved or cleared by the [place] FDA and  has been authorized for detection and/or diagnosis of SARS-CoV-2 by FDA under an Emergency Use Authorization (EUA). This EUA will remain  in effect (meaning this test can be used) for the duration of the COVID-19 declaration under Section 564(b)(1) of the Act, 21 U.S.C.section 360bbb-3(b)(1), unless the authorization is terminated  or revoked sooner.       Influenza A by PCR NEGATIVE NEGATIVE Final   Influenza B by PCR NEGATIVE NEGATIVE Final    Comment: (NOTE) The Xpert Xpress SARS-CoV-2/FLU/RSV plus assay is intended as an aid in the diagnosis of influenza from Nasopharyngeal swab specimens and should not be used as a sole basis for treatment. Nasal washings and aspirates are unacceptable for Xpert Xpress SARS-CoV-2/FLU/RSV testing.  Fact Sheet for Patients: BloggerCourse.com  Fact Sheet for Healthcare Providers: SeriousBroker.it  This test is not yet approved or cleared by the [place] FDA and has been authorized for detection and/or diagnosis of SARS-CoV-2 by FDA under an Emergency Use Authorization (EUA). This EUA will remain in effect (meaning this test can be used) for the duration of the COVID-19 declaration under Section 564(b)(1) of the  Act, 21 U.S.C. section 360bbb-3(b)(1), unless the authorization is terminated or revoked.  Performed at [facility], [address], [place], [place] [address]      Radiology Studies: DG Foot Complete Left  Result Date: 01/07/2022 CLINICAL DATA:  Diabetic foot wound. EXAM: LEFT FOOT - COMPLETE 3+ VIEW COMPARISON:  November 13, 2021. FINDINGS: There is no evidence of fracture or dislocation. No lytic destruction is seen to suggest osteomyelitis. Vascular calcifications are noted. Soft tissue ulceration is again noted lateral  to the proximal base of the fifth metatarsal. IMPRESSION: Lateral soft tissue ulceration is again noted. No definite lytic destruction is seen to suggest osteomyelitis. Electronically Signed   By: Lupita Raider M.D.   On: 01/07/2022 16:31   DG Chest 2 View  Result Date: 01/07/2022 CLINICAL DATA:  Altered mental status in a female at age 8. EXAM: CHEST - 2 VIEW COMPARISON:  September 20, 2019. FINDINGS: Image rotated to the LEFT. Accounting for this cardiomediastinal contours and hilar structures are stable. Lungs are clear. No sign of pleural effusion.  No pneumothorax. On limited assessment no acute skeletal process. Marked glenohumeral degenerative changes. IMPRESSION: 1. No acute cardiopulmonary disease. 2. Marked glenohumeral degenerative changes. Electronically Signed   By: Donzetta Kohut M.D.   On: 01/07/2022 11:33   CT HEAD WO CONTRAST ( )  Result Date: 01/07/2022 CLINICAL DATA:  Mental status change, unknown cause, slurred speech EXAM: CT HEAD WITHOUT CONTRAST TECHNIQUE: Contiguous axial images were obtained from the base of the skull through the vertex without intravenous contrast. RADIATION DOSE REDUCTION: This exam was performed according to the departmental dose-optimization program which includes automated exposure control, adjustment of the mA and/or kV according to patient size and/or use of iterative reconstruction technique. COMPARISON:   06/01/2021 FINDINGS: Brain: Normal anatomic configuration. Parenchymal volume loss is commensurate with the patient's age. Moderate periventricular white matter changes are present likely reflecting the sequela of small vessel ischemia. 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 No abnormal intra or extra-axial mass lesion or fluid collection. No abnormal mass effect or midline shift. No evidence of acute intracranial hemorrhage or infarct. Ventricular size is normal. Cerebellum unremarkable. Vascular: No asymmetric hyperdense vasculature at the skull base. Skull: Intact Sinuses/Orbits: Paranasal sinuses are clear. Ocular lenses have been removed. Orbits are otherwise unremarkable. Other: Mastoid air cells and middle ear cavities are clear. Retained metallic foreign body again seen within the soft tissues superomedial to the right orbit. IMPRESSION: 1. No acute intracranial hemorrhage or infarct. 2. Stable senescent changes and remote infarcts. 3. Stable retained metallic foreign body within the soft tissues superomedial to the right orbit. Electronically Signed   By: Helyn Numbers M.D.   On: 01/07/2022 11:15    Scheduled Meds:  atorvastatin  80 mg Oral QHS   heparin injection (subcutaneous)  5,000 Units Subcutaneous Q8H   insulin aspart  0-5 Units Subcutaneous QHS   insulin aspart  0-9 Units Subcutaneous TID WC   lisinopril  5 mg Oral Daily   potassium chloride  40 mEq Oral Once   Continuous Infusions:  sodium chloride     ceFEPime (MAXIPIME) IV Stopped (01/08/22 1124)   vancomycin       LOS: 0 days    Time spent: 50 mins    Kelby Lotspeich, MD [facility]   If 7PM-7AM, please contact night-coverage

## 2022-01-08 NOTE — Consult Note (Signed)
ORTHOPAEDIC CONSULTATION  REQUESTING PHYSICIAN: Shawna Clamp, MD  Chief Complaint: Nonhealing ulcer left foot  HPI: [name] is a 88 y.o. female who complains of painful nonhealing ulcer to her left foot.  She has been seen in the wound care center for some time now.  Admitted recently with altered mental status.  At this point seems to be getting close to baseline.  Longstanding history of diabetes with neuropathy.  Has had noted cyst drainage from the lateral aspect of the left foot.  I spoke to her son today who states the wound has progressed recently.  Past Medical History:  Diagnosis Date   Carpal tunnel syndrome on both sides    Diabetes ([name])    Diabetic polyneuropathy ([name])    Diverticulitis    Hyperlipidemia    Hypertension    Osteoarthritis    Vaginal prolapse    Past Surgical History:  Procedure Laterality Date   CESAREAN SECTION     LOWER EXTREMITY ANGIOGRAPHY Left 06/11/2021   Procedure: Lower Extremity Angiography;  Surgeon: Katha Cabal, MD;  Location: [facility];  Service: Cardiovascular;  Laterality: Left;   pessary     REPLACEMENT TOTAL KNEE BILATERAL     VENTRAL HERNIA REPAIR     VENTRAL HERNIA REPAIR N/A 01/13/2016   Procedure: HERNIA REPAIR VENTRAL ADULT WITH SMALL BOWEL RESECTION, LYSIS OF ADHESIONS;  Surgeon: Hubbard Robinson, MD;  Location: [facility];  Service: General;  Laterality: N/A;   Social History   Socioeconomic History   Marital status: Widowed    Spouse name: Not on file   Number of children: Not on file   Years of education: Not on file   Highest education level: Not on file  Occupational History   Not on file  Tobacco Use   Smoking status: Never   Smokeless tobacco: Never  Vaping Use   Vaping Use: Never used  Substance and Sexual Activity   Alcohol use: No   Drug use: No   Sexual activity: Not Currently  Other Topics Concern   Not on file  Social History Narrative   Lives at home by herself. Ambulates  with a cane.   Social Determinants of Health   Financial Resource Strain: Not on file  Food Insecurity: Not on file  Transportation Needs: Not on file  Physical Activity: Not on file  Stress: Not on file  Social Connections: Not on file   Family History  Problem Relation Age of Onset   Breast cancer Mother 40   Breast cancer Maternal Aunt    Allergies  Allergen Reactions   Metformin And Related Nausea And Vomiting   Prior to Admission medications   Medication Sig Start Date End Date Taking? Authorizing Provider  acetaminophen (TYLENOL) 325 MG tablet Take 2 tablets (650 mg total) by mouth every 6 (six) hours as needed for mild pain, moderate pain, fever or headache. 01/25/16  Yes Loflin, Lavona Mound, MD  atorvastatin (LIPITOR) 80 MG tablet Take 1 tablet (80 mg total) by mouth daily. 09/26/19  Yes Regalado, Belkys A, MD  clopidogrel (PLAVIX) 75 MG tablet Take 1 tablet (75 mg total) by mouth daily. 09/26/19  Yes Regalado, Belkys A, MD  glimepiride (AMARYL) 4 MG tablet Take 4 mg by mouth daily with breakfast.   Yes [provider]  hydrochlorothiazide (HYDRODIURIL) 12.5 MG tablet Take 12.5 mg by mouth daily. 11/08/21  Yes [provider]  lisinopril (ZESTRIL) 5 MG tablet Take 5 mg by mouth daily.  Yes [provider]  naproxen sodium (ALEVE) 220 MG tablet Take 220 mg by mouth daily as needed (pain).   Yes [provider]  cephALEXin (KEFLEX) 500 MG capsule Take 500 mg by mouth 3 (three) times daily. Patient not taking: Reported on 01/07/2022    [provider]  metFORMIN (GLUCOPHAGE) 500 MG tablet Take 1 tablet (500 mg total) by mouth 2 (two) times daily with a meal. Patient not taking: Reported on 06/06/2021 05/17/21 05/17/22  Marrion Coy, MD  traMADol (ULTRAM) 50 MG tablet Take 50 mg by mouth every 8 (eight) hours as needed for moderate pain. Patient not taking: Reported on 01/07/2022    [provider]   DG Foot Complete  Left  Result Date: 01/07/2022 CLINICAL DATA:  Diabetic foot wound. EXAM: LEFT FOOT - COMPLETE 3+ VIEW COMPARISON:  November 13, 2021. FINDINGS: There is no evidence of fracture or dislocation. No lytic destruction is seen to suggest osteomyelitis. Vascular calcifications are noted. Soft tissue ulceration is again noted lateral to the proximal base of the fifth metatarsal. IMPRESSION: Lateral soft tissue ulceration is again noted. No definite lytic destruction is seen to suggest osteomyelitis. Electronically Signed   By: Lupita Raider M.D.   On: 01/07/2022 16:31   DG Chest 2 View  Result Date: 01/07/2022 CLINICAL DATA:  Altered mental status in a female at age 59. EXAM: CHEST - 2 VIEW COMPARISON:  September 20, 2019. FINDINGS: Image rotated to the LEFT. Accounting for this cardiomediastinal contours and hilar structures are stable. Lungs are clear. No sign of pleural effusion.  No pneumothorax. On limited assessment no acute skeletal process. Marked glenohumeral degenerative changes. IMPRESSION: 1. No acute cardiopulmonary disease. 2. Marked glenohumeral degenerative changes. Electronically Signed   By: Donzetta Kohut M.D.   On: 01/07/2022 11:33   CT HEAD WO CONTRAST ( )  Result Date: 01/07/2022 CLINICAL DATA:  Mental status change, unknown cause, slurred speech EXAM: CT HEAD WITHOUT CONTRAST TECHNIQUE: Contiguous axial images were obtained from the base of the skull through the vertex without intravenous contrast. RADIATION DOSE REDUCTION: This exam was performed according to the departmental dose-optimization program which includes automated exposure control, adjustment of the mA and/or kV according to patient size and/or use of iterative reconstruction technique. COMPARISON:  06/01/2021 FINDINGS: Brain: Normal anatomic configuration. Parenchymal volume loss is commensurate with the patient's age. Moderate periventricular white matter changes are present likely reflecting the sequela of small vessel  ischemia. 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 No abnormal intra or extra-axial mass lesion or fluid collection. No abnormal mass effect or midline shift. No evidence of acute intracranial hemorrhage or infarct. Ventricular size is normal. Cerebellum unremarkable. Vascular: No asymmetric hyperdense vasculature at the skull base. Skull: Intact Sinuses/Orbits: Paranasal sinuses are clear. Ocular lenses have been removed. Orbits are otherwise unremarkable. Other: Mastoid air cells and middle ear cavities are clear. Retained metallic foreign body again seen within the soft tissues superomedial to the right orbit. IMPRESSION: 1. No acute intracranial hemorrhage or infarct. 2. Stable senescent changes and remote infarcts. 3. Stable retained metallic foreign body within the soft tissues superomedial to the right orbit. Electronically Signed   By: Helyn Numbers M.D.   On: 01/07/2022 11:15    Positive ROS: All other systems have been reviewed and were otherwise negative with the exception of those mentioned in the HPI and as above.  12 point ROS was performed.  Physical  Exam: General: Alert and oriented.  No apparent distress.  Vascular:  Left foot:Dorsalis Pedis:  thready Posterior Tibial:  thready  Right foot: Dorsalis Pedis:  thready Posterior Tibial:  thready  Neuro:absent protective sensation but gross sensation seems to be intact.  Derm: Lateral aspect of the base of the fifth metatarsal there is a small 5 to 7 mm open ulceration.  There is drainage with bone mets probed immediately with superficial probing.  Ortho/MS: She is in a contracted position on the left side.  She has flexion contracture of the hip and knee.  Unable to straighten this out at all.  Personally reviewed her x-rays as well as recent CT scan of the left foot.  No obvious signs of erosive change.  There is a large soft tissue shadow  overlying the fifth metatarsal base at this time.  Assessment: Nonhealing diabetic neuropathic ulceration left foot Concern for superficial osteomyelitis  Plan: She has nonhealing ulcer this been present and somewhat progressive of recent.  The bone is easily probed at this time to this left foot.  I would like to perform a bone biopsy and debridement of this area in the OR tomorrow.  I spoke to her son who has given consent for other procedures when in the outpatient setting at the wound care center.  He has consented to the procedure.  I will discuss this with the patient as well.  Plan for surgery tomorrow.  We will place orders for n.p.o.    Elesa Hacker, DPM Cell [phone]   01/08/2022 4:06 PM

## 2022-01-09 ENCOUNTER — Encounter: Payer: Self-pay | Admitting: Podiatry

## 2022-01-09 ENCOUNTER — Encounter
Admission: EM | Disposition: A | Discharge status: Home Health | Payer: Self-pay | Source: Home / Self Care | Attending: Internal Medicine

## 2022-01-09 ENCOUNTER — Other Ambulatory Visit: Payer: Self-pay

## 2022-01-09 ENCOUNTER — Inpatient Hospital Stay: Payer: Medicare HMO | Admitting: Anesthesiology

## 2022-01-09 DIAGNOSIS — I69351 Hemiplegia and hemiparesis following cerebral infarction affecting right dominant side: Secondary | ICD-10-CM

## 2022-01-09 DIAGNOSIS — S91302A Unspecified open wound, left foot, initial encounter: Secondary | ICD-10-CM | POA: Diagnosis not present

## 2022-01-09 DIAGNOSIS — I69951 Hemiplegia and hemiparesis following unspecified cerebrovascular disease affecting right dominant side: Secondary | ICD-10-CM

## 2022-01-09 DIAGNOSIS — E11621 Type 2 diabetes mellitus with foot ulcer: Secondary | ICD-10-CM | POA: Diagnosis not present

## 2022-01-09 DIAGNOSIS — E162 Hypoglycemia, unspecified: Secondary | ICD-10-CM | POA: Diagnosis not present

## 2022-01-09 HISTORY — PX: IRRIGATION AND DEBRIDEMENT FOOT: SHX6602

## 2022-01-09 LAB — BASIC METABOLIC PANEL
Anion gap: 7 (ref 5–15)
BUN: 21 mg/dL (ref 8–23)
CO2: 25 mmol/L (ref 22–32)
Calcium: 7.8 mg/dL — ABNORMAL LOW (ref 8.9–10.3)
Chloride: 101 mmol/L (ref 98–111)
Creatinine, Ser: 0.74 mg/dL (ref 0.44–1.00)
GFR, Estimated: >60 mL/min (ref >60)
Glucose, Bld: 285 mg/dL — ABNORMAL HIGH (ref 70–99)
Potassium: 4.2 mmol/L (ref 3.5–5.1)
Sodium: 133 mmol/L — ABNORMAL LOW (ref 135–145)

## 2022-01-09 LAB — HEMOGLOBIN A1C
Hgb A1c MFr Bld: 7.1 % — ABNORMAL HIGH (ref 4.8–5.6)
Mean Plasma Glucose: 157 mg/dL

## 2022-01-09 LAB — GLUCOSE, CAPILLARY
Glucose-Capillary: 278 mg/dL — ABNORMAL HIGH (ref 70–99)
Glucose-Capillary: 124 mg/dL — ABNORMAL HIGH (ref 70–99)
Glucose-Capillary: 110 mg/dL — ABNORMAL HIGH (ref 70–99)
Glucose-Capillary: 103 mg/dL — ABNORMAL HIGH (ref 70–99)
Glucose-Capillary: 102 mg/dL — ABNORMAL HIGH (ref 70–99)

## 2022-01-09 LAB — MAGNESIUM: Magnesium: 1.6 mg/dL — ABNORMAL LOW (ref 1.7–2.4)

## 2022-01-09 SURGERY — IRRIGATION AND DEBRIDEMENT FOOT
Anesthesia: General | Site: Foot | Laterality: Left

## 2022-01-09 MED ORDER — BUPIVACAINE HCL (PF) 0.5 % IJ SOLN
INTRAMUSCULAR | Status: AC
  Filled 2022-01-09: qty 30

## 2022-01-09 MED ORDER — FENTANYL CITRATE (PF) 100 MCG/2ML IJ SOLN: 25.0000 ug | INTRAMUSCULAR | Status: DC | PRN

## 2022-01-09 MED ORDER — EPHEDRINE SULFATE (PRESSORS) 50 MG/ML IJ SOLN
INTRAMUSCULAR | Status: DC | PRN
  Administered 2022-01-09: 10 mg via INTRAVENOUS

## 2022-01-09 MED ORDER — SODIUM CHLORIDE 0.9 % IR SOLN
Status: DC | PRN
  Administered 2022-01-09: 1000 mL

## 2022-01-09 MED ORDER — PROPOFOL 500 MG/50ML IV EMUL
INTRAVENOUS | Status: DC | PRN
  Administered 2022-01-09: 200 ug/kg/min via INTRAVENOUS

## 2022-01-09 MED ORDER — 0.9 % SODIUM CHLORIDE (POUR BTL) OPTIME
TOPICAL | Status: DC | PRN
  Administered 2022-01-09: 500 mL

## 2022-01-09 MED ORDER — MAGNESIUM SULFATE 2 GM/50ML IV SOLN
2.0000 g | Freq: Once | INTRAVENOUS | Status: AC
  Administered 2022-01-09: 2 g via INTRAVENOUS
  Filled 2022-01-09: qty 50

## 2022-01-09 MED ORDER — LIDOCAINE-EPINEPHRINE (PF) 1 %-1:200000 IJ SOLN
INTRAMUSCULAR | Status: DC | PRN
  Administered 2022-01-09: 8 mL via INTRAMUSCULAR

## 2022-01-09 MED ORDER — LIDOCAINE-EPINEPHRINE 1 %-1:100000 IJ SOLN
INTRAMUSCULAR | Status: AC
  Filled 2022-01-09: qty 1

## 2022-01-09 MED ORDER — PHENYLEPHRINE HCL-NACL 20-0.9 MG/250ML-% IV SOLN
INTRAVENOUS | Status: DC | PRN
  Administered 2022-01-09: 50 ug/min via INTRAVENOUS

## 2022-01-09 MED ORDER — ONDANSETRON HCL 4 MG/2ML IJ SOLN: 4.0000 mg | Freq: Once | INTRAMUSCULAR | Status: DC | PRN

## 2022-01-09 SURGICAL SUPPLY — 62 items
BLADE OSC/SAGITTAL MD 5.5X18 (BLADE) IMPLANT
BLADE OSCILLATING/SAGITTAL (BLADE)
BLADE SW THK.38XMED LNG THN (BLADE) IMPLANT
BNDG COHESIVE 4X5 TAN STRL LF (GAUZE/BANDAGES/DRESSINGS) ×1 IMPLANT
BNDG COHESIVE 6X5 TAN ST LF (GAUZE/BANDAGES/DRESSINGS) ×1 IMPLANT
BNDG ELASTIC 4X5.8 VLCR STR LF (GAUZE/BANDAGES/DRESSINGS) ×1 IMPLANT
BNDG ESMARK 4X12 TAN STRL LF (GAUZE/BANDAGES/DRESSINGS) ×1 IMPLANT
BNDG GAUZE DERMACEA FLUFF 4 (GAUZE/BANDAGES/DRESSINGS) ×1 IMPLANT
BNDG STRETCH GAUZE 3IN X12FT (GAUZE/BANDAGES/DRESSINGS) ×1 IMPLANT
CUFF TOURN SGL QUICK 12 (TOURNIQUET CUFF) IMPLANT
CUFF TOURN SGL QUICK 18X4 (TOURNIQUET CUFF) IMPLANT
DRAPE FLUOR MINI C-ARM 54X84 (DRAPES) IMPLANT
DRAPE XRAY CASSETTE 23X24 (DRAPES) IMPLANT
DRSG AQUACEL AG ADV 3.5X 4 (GAUZE/BANDAGES/DRESSINGS) IMPLANT
DRSG MEPILEX FLEX 3X3 (GAUZE/BANDAGES/DRESSINGS) IMPLANT
DURAPREP 26ML APPLICATOR (WOUND CARE) ×1 IMPLANT
ELECT REM PT RETURN 9FT ADLT (ELECTROSURGICAL) ×1
ELECTRODE REM PT RTRN 9FT ADLT (ELECTROSURGICAL) ×1 IMPLANT
GAUZE PACKING 0.25INX5YD STRL (GAUZE/BANDAGES/DRESSINGS) ×1 IMPLANT
GAUZE SPONGE 4X4 12PLY STRL (GAUZE/BANDAGES/DRESSINGS) ×1 IMPLANT
GAUZE STRETCH 2X75IN STRL (MISCELLANEOUS) ×1 IMPLANT
GAUZE XEROFORM 1X8 LF (GAUZE/BANDAGES/DRESSINGS) ×1 IMPLANT
GLOVE BIO SURGEON STRL SZ7.5 (GLOVE) ×1 IMPLANT
GLOVE SURG UNDER LTX SZ8 (GLOVE) ×1 IMPLANT
GOWN STRL REUS W/ TWL XL LVL3 (GOWN DISPOSABLE) ×2 IMPLANT
GOWN STRL REUS W/TWL MED LVL3 (GOWN DISPOSABLE) ×2 IMPLANT
GOWN STRL REUS W/TWL XL LVL3 (GOWN DISPOSABLE) ×2
HANDPIECE VERSAJET DEBRIDEMENT (MISCELLANEOUS) IMPLANT
IV NS 1000ML (IV SOLUTION) ×1
IV NS 1000ML BAXH (IV SOLUTION) ×1 IMPLANT
IV NS IRRIG 3000ML ARTHROMATIC (IV SOLUTION) ×1 IMPLANT
KIT TURNOVER KIT A (KITS) ×1 IMPLANT
LABEL OR SOLS (LABEL) ×1 IMPLANT
MANIFOLD NEPTUNE II (INSTRUMENTS) ×1 IMPLANT
NDL BIOPSY JAMSHIDI 11X6 (NEEDLE) IMPLANT
NDL FILTER BLUNT 18X1 1/2 (NEEDLE) ×1 IMPLANT
NDL HYPO 25X1 1.5 SAFETY (NEEDLE) ×1 IMPLANT
NEEDLE BIOPSY JAMSHIDI 11X6 (NEEDLE) ×1 IMPLANT
NEEDLE FILTER BLUNT 18X1 1/2 (NEEDLE) IMPLANT
NEEDLE HYPO 25X1 1.5 SAFETY (NEEDLE) ×1 IMPLANT
NS IRRIG 500ML POUR BTL (IV SOLUTION) ×1 IMPLANT
PACK EXTREMITY ARMC (MISCELLANEOUS) ×1 IMPLANT
PACKING GAUZE IODOFORM 1INX5YD (GAUZE/BANDAGES/DRESSINGS) ×1 IMPLANT
PAD ABD DERMACEA PRESS 5X9 (GAUZE/BANDAGES/DRESSINGS) ×1 IMPLANT
PULSAVAC PLUS IRRIG FAN TIP (DISPOSABLE) ×1
RASP SM TEAR CROSS CUT (RASP) IMPLANT
SHIELD FULL FACE ANTIFOG 7M (MISCELLANEOUS) ×1 IMPLANT
SOL PREP PVP 2OZ (MISCELLANEOUS) ×1
SOLUTION PREP PVP 2OZ (MISCELLANEOUS) ×1 IMPLANT
STOCKINETTE IMPERVIOUS 9X36 MD (GAUZE/BANDAGES/DRESSINGS) ×1 IMPLANT
SUT ETHILON 2 0 FS 18 (SUTURE) ×2 IMPLANT
SUT ETHILON 4-0 (SUTURE)
SUT ETHILON 4-0 FS2 18XMFL BLK (SUTURE)
SUT VIC AB 3-0 SH 27 (SUTURE)
SUT VIC AB 3-0 SH 27X BRD (SUTURE) ×1 IMPLANT
SUT VIC AB 4-0 FS2 27 (SUTURE) ×1 IMPLANT
SUTURE ETHLN 4-0 FS2 18XMF BLK (SUTURE) ×1 IMPLANT
SWAB CULTURE AMIES ANAERIB BLU (MISCELLANEOUS) IMPLANT
SYR 10ML LL (SYRINGE) ×2 IMPLANT
TIP FAN IRRIG PULSAVAC PLUS (DISPOSABLE) ×1 IMPLANT
TRAP FLUID SMOKE EVACUATOR (MISCELLANEOUS) ×1 IMPLANT
WATER STERILE IRR 500ML POUR (IV SOLUTION) ×1 IMPLANT

## 2022-01-09 NOTE — Progress Notes (Signed)
Telemetry order expired, telemetry discontinued.

## 2022-01-09 NOTE — Transfer of Care (Signed)
Immediate Anesthesia Transfer of Care Note  Patient: [name]  Procedure(s) Performed: IRRIGATION AND DEBRIDEMENT FOOT WITH BONE BIOPSY (Left: Foot)  Patient Location: PACU  Anesthesia Type:MAC  Level of Consciousness: awake and sedated  Airway & Oxygen Therapy: Patient Spontanous Breathing  Post-op Assessment: Report given to RN and Post -op Vital signs reviewed and stable  Post vital signs: Reviewed and stable  Last Vitals:  Vitals Value Taken Time  BP 88/46 01/09/22 1410  Temp 36.2 C 01/09/22 1403  Pulse 82 01/09/22 1411  Resp 16 01/09/22 1411  SpO2 99 % 01/09/22 1411  Vitals shown include unvalidated device data.  Last Pain:  Vitals:   01/09/22 1230  TempSrc: Temporal  PainSc: 0-No pain         Complications: No notable events documented.

## 2022-01-09 NOTE — Anesthesia Procedure Notes (Signed)
Date/Time: 01/09/2022 1:51 PM  Performed by: Junious Silk, CRNAPre-anesthesia Checklist: Patient identified, Emergency Drugs available, Suction available, Patient being monitored and Timeout performed Oxygen Delivery Method: Simple face mask

## 2022-01-09 NOTE — Progress Notes (Addendum)
  Progress Note   Patient: [name] XVQ:[id number] DOB: Sep 04, 1933 DOA: 01/07/2022     1 DOS: the patient was seen and examined on 01/09/2022   Brief hospital course: [name] is a 88 year old female with history of non-insulin-dependent diabetes mellitus, hyperlipidemia, hypertension, who presents emergency department for chief concerns of altered mental status.  She was found to have hypoglycemia. She also was found to have left lateral foot wound, seen by Dr. Toni Arthurs from podiatry, scheduled for biopsy.   Assessment and Plan: Metabolic Encephalopathy: Hypoglycemia secondary to glimepiride. Patient condition had improved, currently her mental status seems to be back to baseline.  Glimepiride will be discontinued.  Open wound of left foot with infection. Currently on vancomycin, followed by podiatry, scheduled to have biopsy today.  Type 2 diabetes with hyperglycemia Discontinue oral diabetic medicines.  History of stroke Right-sided hemiplegia secondary to stroke. Continue home medicines.  Hypokalemia Hypomagnesemia Repleted.     Subjective:  Patient doing well, no fever or chills.  Has some confusion.  Physical Exam: Vitals:   01/08/22 1959 01/09/22 0130 01/09/22 0417 01/09/22 0801  BP:  118/65 132/64 138/60  Pulse: (!) 109 (!) 111 (!) 109 95  Resp:  18 20 19   Temp:  98.7 F (37.1 C) 97.9 F (36.6 C) 98.1 F (36.7 C)  TempSrc:      SpO2: 99% 95% 99% 100%  Weight:      Height:       General exam: Appears calm and comfortable  Respiratory system: Clear to auscultation. Respiratory effort normal. Cardiovascular system: S1 & S2 heard, RRR. No JVD, murmurs, rubs, gallops or clicks. No pedal edema. Gastrointestinal system: Abdomen is nondistended, soft and nontender. No organomegaly or masses felt. Normal bowel sounds heard. Central nervous system: Alert and oriented x1.  Right spasmic hemiplegia.  Extremities: Symmetric 5 x 5 power. Skin: No  rashes, lesions or ulcers   Data Reviewed:  CT results, lab results reviewed.  Family Communication: Son updated over the phone  Disposition: Status is: Inpatient Remains inpatient appropriate because: Severity of disease, IV treatment.  Inpatient procedure.  Planned Discharge Destination: Home with Home Health    Time spent: 50 minutes  Author: , MD 01/09/2022 12:14 PM  For on call review [email or web address].

## 2022-01-09 NOTE — Op Note (Signed)
Operative note   Surgeon:Shawnte Demarest [employer] forces logistics/support/administrative officer: None    Preop diagnosis: 1.  Left fifth metatarsal ulceration to bone     Postop diagnosis: Same    Procedure: Debridement of ulceration with biopsy of fifth metatarsal bone left foot    EBL: Minimal    Anesthesia:local and IV sedation.  Local consisted of a total of 4 cc of 0.5% bupivacaine and 4 cc of 1% lidocaine with epinephrine    Hemostasis: None    Specimen: Bone from fifth metatarsal for pathology and bone from fifth metatarsal for culture    Complications: None    Operative indications:[name] is an 88 y.o. that presents today for surgical intervention.  The risks/benefits/alternatives/complications have been discussed and consent has been given.    Procedure:  Patient was brought into the OR and placed on the operating table in thesupine position. After anesthesia was obtained theleft lower extremity was prepped and draped in usual sterile fashion.  Attention was directed to the lateral aspect of the left foot where a noted open ulceration was found.  This ulceration measured about 1 x 1.5 cm.  It was full-thickness with exposed bone.  Initially a Versajet was used to remove all of the nonviable tissue down to the level of bone at this time.  At this time once the full-thickness debridement was performed a Jamshidi needle was used to remove 2 small cylindrical portions of bone from the lateral aspect of the fifth metatarsal.  One portion was sent for culture and a sterile cup and the second was sent for pathological examination.  The wound was once again flushed and debrided with the Versajet.  Predebridement measurements were 1.0 x 1.5 cm.  Postdebridement measurements were 1.2 x 1.7 cm.  At this time a calcium alginate with silver dressing was applied to the left fifth metatarsal ulcerative site.    Patient tolerated the procedure and anesthesia well.  Was transported from the OR to the PACU with all vital signs  stable and vascular status intact. To be discharged per routine protocol.  Will follow up in approximately 1 week in the outpatient clinic.

## 2022-01-09 NOTE — Anesthesia Preprocedure Evaluation (Signed)
Anesthesia Evaluation  Patient identified by MRN, date of birth, ID band Patient awake    Reviewed: Allergy & Precautions, H&P , NPO status , Patient's Chart, lab work & pertinent test results, reviewed documented beta blocker date and time   Airway Mallampati: II  TM Distance: >3 FB Neck ROM: full    Dental  (+) Teeth Intact   Pulmonary neg pulmonary ROS   Pulmonary exam normal        Cardiovascular Exercise Tolerance: Good hypertension, On Medications + Peripheral Vascular Disease  Normal cardiovascular exam Rate:Normal     Neuro/Psych  Neuromuscular disease CVA, No Residual Symptoms  negative psych ROS   GI/Hepatic negative GI ROS, Neg liver ROS,,,  Endo/Other  negative endocrine ROSdiabetes, Well Controlled    Renal/GU negative Renal ROS  negative genitourinary   Musculoskeletal   Abdominal   Peds  Hematology negative hematology ROS (+)   Anesthesia Other Findings   Reproductive/Obstetrics negative OB ROS                             Anesthesia Physical Anesthesia Plan  ASA: 4  Anesthesia Plan: General LMA   Post-op Pain Management:    Induction:   PONV Risk Score and Plan:   Airway Management Planned:   Additional Equipment:   Intra-op Plan:   Post-operative Plan:   Informed Consent: I have reviewed the patients History and Physical, chart, labs and discussed the procedure including the risks, benefits and alternatives for the proposed anesthesia with the patient or authorized representative who has indicated his/her understanding and acceptance.       Plan Discussed with: CRNA  Anesthesia Plan Comments:        Anesthesia Quick Evaluation

## 2022-01-09 NOTE — TOC Initial Note (Signed)
Transition of Care [facility]) - Initial/Assessment Note    Patient Details  Name: [name] MRN: [id number] Date of Birth: 12-Jan-1934  [facility]) CM/SW Contact:    Truddie Hidden, RN Phone Number: 01/09/2022, 10:19 AM  Clinical Narrative:                  Transition of Care [facility]) Screening Note   Patient Details  Name: [name] Date of Birth: [birth year]   Transition of Care [facility]) CM/SW Contact:    Truddie Hidden, RN Phone Number: 01/09/2022, 10:19 AM    Transition of Care Department (TOC) has reviewed patient and no TOC needs have been identified at this time. We will continue to monitor patient advancement through interdisciplinary progression rounds. If new patient transition needs arise, please place a TOC consult.          Patient Goals and CMS Choice        Expected Discharge Plan and Services                                                Prior Living Arrangements/Services                       Activities of Daily Living   ADL Screening (condition at time of admission) Patient's cognitive ability adequate to safely complete daily activities?: No Is the patient deaf or have difficulty hearing?: No Does the patient have difficulty seeing, even when wearing glasses/contacts?: No Does the patient have difficulty concentrating, remembering, or making decisions?: Yes Patient able to express need for assistance with ADLs?: Yes Does the patient have difficulty dressing or bathing?: Yes Independently performs ADLs?: No Communication: Needs assistance Is this a change from baseline?: Pre-admission baseline Dressing (OT): Needs assistance Is this a change from baseline?: Pre-admission baseline Grooming: Needs assistance Is this a change from baseline?: Pre-admission baseline Feeding: Needs assistance Is this a change from baseline?: Pre-admission baseline Bathing: Needs assistance Is this a change from  baseline?: Pre-admission baseline Toileting: Needs assistance Is this a change from baseline?: Pre-admission baseline In/Out Bed: Needs assistance Is this a change from baseline?: Pre-admission baseline Walks in Home: Dependent (patient says she cannot walk left leg and arm contracted) Is this a change from baseline?: Pre-admission baseline Does the patient have difficulty walking or climbing stairs?: Yes Weakness of Legs: Left Weakness of Arms/Hands: Left  Permission Sought/Granted                  Emotional Assessment              Admission diagnosis:  Transient alteration of awareness [R40.4] Weakness [R53.1] Hypoglycemia [E16.2] Leg wound, left [S81.802A] Patient Active Problem List   Diagnosis Date Noted   Leg wound, left 01/08/2022   Hypoglycemia 01/07/2022   Leukocytosis 01/07/2022   MRI contraindicated due to metal implant 01/07/2022   Open wound of left foot 01/07/2022   Atherosclerosis of native arteries of the extremities with ulceration (HCC) 06/06/2021   Pressure injury of skin 06/01/2021   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   PAD (peripheral artery disease) (HCC) 05/17/2021   Right hemiparesis (HCC) 05/16/2021   Left leg cellulitis 05/15/2021   Hypokalemia 05/15/2021   CVA (cerebral vascular accident) (HCC) 11/14/2019   History of stroke 10/07/2019   Altered mental status 10/06/2019  Carotid artery disease (HCC) 09/28/2019   Left hemiparesis (HCC) 09/28/2019   UTI (urinary tract infection) 09/20/2019   Sepsis (HCC) 03/22/2018   Primary osteoarthritis of both hips 11/06/2017   Microalbuminuria 11/02/2017   Cystocele, midline 09/04/2017   Incomplete uterine prolapse 09/04/2017   Type 2 diabetes mellitus with microalbuminuria, without long-term current use of insulin (HCC) 07/27/2017   Ankle edema 04/27/2017   Pure hypercholesterolemia 03/30/2016   Malnutrition of moderate degree 01/15/2016   Dehydration    Incarcerated ventral hernia    Diabetic  ketoacidosis without coma associated with type 2 diabetes mellitus (HCC)    Type 2 diabetes mellitus with polyneuropathy (HCC) 08/06/2015   Carpal tunnel syndrome, bilateral 07/10/2015   Osteopenia of multiple sites 07/10/2015   Arthralgia of multiple sites 06/27/2015   Mixed hyperlipidemia 11/06/2014   Hypomagnesemia 11/02/2013   Essential hypertension 11/02/2013   PCP:  Gracelyn Nurse, MD Pharmacy:   [facility] - [place], [place] - [address] [place] [address] Phone: [phone] Fax: [phone]  [id number] [place], [place] - [address] AT [facility] [address] [place] [address] Phone: [phone] Fax: [phone]     Social Determinants of Health (SDOH) Interventions    Readmission Risk Interventions     No data to display

## 2022-01-09 NOTE — Progress Notes (Signed)
To surgery via transport.

## 2022-01-09 NOTE — Care Management Important Message (Signed)
Important Message  Patient Details  Name: [name] MRN: [id number] Date of Birth: 15-Jul-1933   Medicare Important Message Given:  N/A - LOS <3 / Initial given by admissions     Johnell Comings 01/09/2022, 5:36 PM

## 2022-01-10 DIAGNOSIS — I69952 Hemiplegia and hemiparesis following unspecified cerebrovascular disease affecting left dominant side: Secondary | ICD-10-CM | POA: Diagnosis not present

## 2022-01-10 DIAGNOSIS — E162 Hypoglycemia, unspecified: Secondary | ICD-10-CM | POA: Diagnosis not present

## 2022-01-10 DIAGNOSIS — B3731 Acute candidiasis of vulva and vagina: Secondary | ICD-10-CM | POA: Insufficient documentation

## 2022-01-10 DIAGNOSIS — S91302A Unspecified open wound, left foot, initial encounter: Secondary | ICD-10-CM | POA: Diagnosis not present

## 2022-01-10 LAB — GLUCOSE, CAPILLARY
Glucose-Capillary: 104 mg/dL — ABNORMAL HIGH (ref 70–99)
Glucose-Capillary: 115 mg/dL — ABNORMAL HIGH (ref 70–99)
Glucose-Capillary: 139 mg/dL — ABNORMAL HIGH (ref 70–99)
Glucose-Capillary: 97 mg/dL (ref 70–99)

## 2022-01-10 LAB — MAGNESIUM: Magnesium: 2 mg/dL (ref 1.7–2.4)

## 2022-01-10 LAB — BASIC METABOLIC PANEL
Anion gap: 5 (ref 5–15)
BUN: 16 mg/dL (ref 8–23)
CO2: 27 mmol/L (ref 22–32)
Calcium: 8.5 mg/dL — ABNORMAL LOW (ref 8.9–10.3)
Chloride: 107 mmol/L (ref 98–111)
Creatinine, Ser: 0.52 mg/dL (ref 0.44–1.00)
GFR, Estimated: >60 mL/min (ref >60)
Glucose, Bld: 110 mg/dL — ABNORMAL HIGH (ref 70–99)
Potassium: 3.7 mmol/L (ref 3.5–5.1)
Sodium: 139 mmol/L (ref 135–145)

## 2022-01-10 MED ORDER — DOXYCYCLINE MONOHYDRATE 100 MG PO CAPS: 100.0000 mg | ORAL_CAPSULE | Freq: Two times a day (BID) | ORAL | 0 refills | Status: AC

## 2022-01-10 MED ORDER — CEPHALEXIN 500 MG PO CAPS: 500.0000 mg | ORAL_CAPSULE | Freq: Three times a day (TID) | ORAL | 0 refills | Status: AC

## 2022-01-10 MED ORDER — FLUCONAZOLE 100 MG PO TABS
200.0000 mg | ORAL_TABLET | Freq: Once | ORAL | Status: AC
  Administered 2022-01-10: 200 mg via ORAL
  Filled 2022-01-10: qty 2

## 2022-01-10 NOTE — Discharge Summary (Addendum)
Physician Discharge Summary   Patient: [name] MRN: [id number] DOB: Sep 21, 1933  Admit date:     01/07/2022  Discharge date: 01/10/22  Discharge Physician: Marrion Coy   PCP: Gracelyn Nurse, MD   Recommendations at discharge:   Follow-up with PCP as outpatient. Follow-up with podiatry in 1 week.  Discharge Diagnoses: Principal Problem:   Hypoglycemia Active Problems:   Type 2 diabetes mellitus with polyneuropathy (HCC)   Mixed hyperlipidemia   Hypomagnesemia   Essential hypertension   Altered mental status   Hypokalemia   PAD (peripheral artery disease) (HCC)   Type 2 diabetes mellitus with microalbuminuria, without long-term current use of insulin (HCC)   Leukocytosis   MRI contraindicated due to metal implant   Open wound of left foot   Leg wound, left   Hemiplegia affecting left side in left-dominant patient as late effect of cerebrovascular disease (HCC) Hyponatremia resolved. Resolved Problems:   * No resolved hospital problems. [facility] Course: [name] is a 88 year old female with history of non-insulin-dependent diabetes mellitus, hyperlipidemia, hypertension, who presents emergency department for chief concerns of altered mental status.  She was found to have hypoglycemia. She also was found to have left lateral foot wound, seen by Dr. Ether Griffins from podiatry, bone biopsy performed on 11/30. Podiatry will follow up with the patient in 5 to 7 days for biopsy results.  Continue antibiotics with doxycycline and Keflex.  I will also discontinue glimepiride for hyperglycemia.  Glucose has been better.  She will follow-up with PCP as outpatient.   Assessment and Plan: Metabolic Encephalopathy: Hypoglycemia secondary to glimepiride. Patient condition had improved, currently her mental status seems to be back to baseline.  Glimepiride will be discontinued.  Patient did not have another episode of hypoglycemia. Patient need to follow-up with PCP to  monitor glucose.   Open wound of left foot with infection. Treated with vancomycin and cefepime. Patient is followed up with podiatry, biopsy was performed 11/30, patient is medically stable to be discharged.  Podiatry recommended outpatient follow-up for biopsy results.   Type 2 diabetes with hypoglycemia (not hyperglycemia) Discontinue oral diabetic medicines.   History of stroke Left-sided hemiplegia secondary to stroke (not right side as in yesterday note) Continue home medicines.   Hypokalemia Hypomagnesemia Hyponatremia. Repleted.  Level normalized.  I discontinued HCTZ.  Vaginal candidiasis. Give 1 dose of Diflucan 200 mg.     Consultants: Podiatry. Procedures performed: Bone biopsy. Disposition: Home Diet recommendation:  Discharge Diet Orders (From admission, onward)     Start     Ordered   01/10/22 0000  Diet - low sodium heart healthy        01/10/22 0934           Cardiac diet DISCHARGE MEDICATION: Allergies as of 01/10/2022       Reactions   Metformin And Related Nausea And Vomiting        Medication List     STOP taking these medications    glimepiride 4 MG tablet Commonly known as: AMARYL   hydrochlorothiazide 12.5 MG tablet Commonly known as: HYDRODIURIL   metFORMIN 500 MG tablet Commonly known as: Glucophage   naproxen sodium 220 MG tablet Commonly known as: ALEVE   traMADol 50 MG tablet Commonly known as: ULTRAM       TAKE these medications    acetaminophen 325 MG tablet Commonly known as: TYLENOL Take 2 tablets (650 mg total) by mouth every 6 (six) hours as needed for mild pain,  moderate pain, fever or headache.   atorvastatin 80 MG tablet Commonly known as: LIPITOR Take 1 tablet (80 mg total) by mouth daily.   cephALEXin 500 MG capsule Commonly known as: KEFLEX Take 1 capsule (500 mg total) by mouth 3 (three) times daily for 7 days.   clopidogrel 75 MG tablet Commonly known as: PLAVIX Take 1 tablet (75 mg total)  by mouth daily.   doxycycline 100 MG capsule Commonly known as: MONODOX Take 1 capsule (100 mg total) by mouth 2 (two) times daily for 7 days.   lisinopril 5 MG tablet Commonly known as: ZESTRIL Take 5 mg by mouth daily.               Discharge Care Instructions  (From admission, onward)           Start     Ordered   01/10/22 0000  Discharge wound care:       Comments: Follow with podiatry   01/10/22 0934            Follow-up Information     Fowler, Justin, DPM Follow up in 1 week(s).   Specialty: Podiatry Contact information: [address] [place] [address] [phone]         Johnston, John D, MD Follow up in 1 week(s).   Specialty: Internal Medicine Contact information: [address] [place] [address] [phone]                Discharge Exam: Filed Weights   01/07/22 2031  Weight: 63.5 kg   General exam: Appears calm and comfortable  Respiratory system: Clear to auscultation. Respiratory effort normal. Cardiovascular system: S1 & S2 heard, RRR. No JVD, murmurs, rubs, gallops or clicks. No pedal edema. Gastrointestinal system: Abdomen is nondistended, soft and nontender. No organomegaly or masses felt. Normal bowel sounds heard. Central nervous system: Alert and oriented x3.  Left hemiplegia. Extremities: Symmetric 5 x 5 power. Skin: No rashes, lesions or ulcers Psychiatry: Judgement and insight appear normal. Mood & affect appropriate.    Condition at discharge: good  The results of significant diagnostics from this hospitalization (including imaging, microbiology, ancillary and laboratory) are listed below for reference.   Imaging Studies: DG Foot Complete Left  Result Date: 01/07/2022 CLINICAL DATA:  Diabetic foot wound. EXAM: LEFT FOOT - COMPLETE 3+ VIEW COMPARISON:  November 13, 2021. FINDINGS: There is no evidence of fracture or dislocation. No lytic destruction is seen to suggest osteomyelitis.  Vascular calcifications are noted. Soft tissue ulceration is again noted lateral to the proximal base of the fifth metatarsal. IMPRESSION: Lateral soft tissue ulceration is again noted. No definite lytic destruction is seen to suggest osteomyelitis. Electronically Signed   By: James  Green Jr M.D.   On: 01/07/2022 16:31   DG Chest 2 View  Result Date: 01/07/2022 CLINICAL DATA:  Altered mental status in a female at age 56. EXAM: CHEST - 2 VIEW COMPARISON:  September 20, 2019. FINDINGS: Image rotated to the LEFT. Accounting for this cardiomediastinal contours and hilar structures are stable. Lungs are clear. No sign of pleural effusion.  No pneumothorax. On limited assessment no acute skeletal process. Marked glenohumeral degenerative changes. IMPRESSION: 1. No acute cardiopulmonary disease. 2. Marked glenohumeral degenerative changes. Electronically Signed   By: Geoffrey  Wile M.D.   On: 01/07/2022 11:33   CT HEAD WO CONTRAST ( 841Ar de841Ar de841Ar de841Ar de841Ar de841Ar de841Ar de841Ar  de841Ar de841Ar  de841Ar de841Ar de841Ar de841Ar de841Ar de841Ar de841Ar de841Ar de841Ar de841Ar de841Ar de841Ardeth Sportsmante: 01/07/2022 CLINICAL DATA:  Mental status change, unknown cause, slurred speech EXAM: CT HEAD WITHOUT CONTRAST TECHNIQUE: Contiguous axial  images were obtained from the base of the skull through the vertex without intravenous contrast. RADIATION DOSE REDUCTION: This exam was performed according to the departmental dose-optimization program which includes automated exposure control, adjustment of the mA and/or kV according to patient size and/or use of iterative reconstruction technique. COMPARISON:  06/01/2021 FINDINGS: Brain: Normal anatomic configuration. Parenchymal volume loss is commensurate with the patient's age. Moderate periventricular white matter changes are present likely reflecting the sequela of small vessel ischemia. 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 No abnormal intra or extra-axial mass lesion or fluid collection. No abnormal mass effect or midline shift.  No evidence of acute intracranial hemorrhage or infarct. Ventricular size is normal. Cerebellum unremarkable. Vascular: No asymmetric hyperdense vasculature at the skull base. Skull: Intact Sinuses/Orbits: Paranasal sinuses are clear. Ocular lenses have been removed. Orbits are otherwise unremarkable. Other: Mastoid air cells and middle ear cavities are clear. Retained metallic foreign body again seen within the soft tissues superomedial to the right orbit. IMPRESSION: 1. No acute intracranial hemorrhage or infarct. 2. Stable senescent changes and remote infarcts. 3. Stable retained metallic foreign body within the soft tissues superomedial to the right orbit. Electronically Signed   By: Helyn Numbers M.D.   On: 01/07/2022 11:15    Microbiology: Results for orders placed or performed during the hospital encounter of 01/07/22  Resp Panel by RT-PCR (Flu A&B, Covid) Anterior Nasal Swab     Status: None   Collection Time: 01/07/22 10:44 AM   Specimen: Anterior Nasal Swab  Result Value Ref Range Status   SARS Coronavirus 2 by RT PCR NEGATIVE NEGATIVE Final    Comment: (NOTE) SARS-CoV-2 target nucleic acids are NOT DETECTED.  The SARS-CoV-2 RNA is generally detectable in upper respiratory specimens during the acute phase of infection. The lowest concentration of SARS-CoV-2 viral copies this assay can detect is 138 copies/mL. A negative result does not preclude SARS-Cov-2 infection and should not be used as the sole basis for treatment or other patient management decisions. A negative result may occur with  improper specimen collection/handling, submission of specimen other than nasopharyngeal swab, presence of viral mutation(s) within the areas targeted by this assay, and inadequate number of viral copies(<138 copies/mL). A negative result must be combined with clinical observations, patient history, and epidemiological information. The expected result is Negative.  Fact Sheet for Patients:   BloggerCourse.com  Fact Sheet for Healthcare Providers:  SeriousBroker.it  This test is no t yet approved or cleared by the [place] FDA and  has been authorized for detection and/or diagnosis of SARS-CoV-2 by FDA under an Emergency Use Authorization (EUA). This EUA will remain  in effect (meaning this test can be used) for the duration of the COVID-19 declaration under Section 564(b)(1) of the Act, 21 U.S.C.section 360bbb-3(b)(1), unless the authorization is terminated  or revoked sooner.       Influenza A by PCR NEGATIVE NEGATIVE Final   Influenza B by PCR NEGATIVE NEGATIVE Final    Comment: (NOTE) The Xpert Xpress SARS-CoV-2/FLU/RSV plus assay is intended as an aid in the diagnosis of influenza from Nasopharyngeal swab specimens and should not be used as a sole basis for treatment. Nasal washings and aspirates are unacceptable for Xpert Xpress SARS-CoV-2/FLU/RSV testing.  Fact Sheet for Patients: BloggerCourse.com  Fact Sheet for Healthcare Providers: SeriousBroker.it  This test is not yet approved or cleared by the [place] FDA and has been authorized for detection  and/or diagnosis of SARS-CoV-2 by FDA under an Emergency Use Authorization (EUA). This EUA will remain in effect (meaning this test can be used) for the duration of the COVID-19 declaration under Section 564(b)(1) of the Act, 21 U.S.C. section 360bbb-3(b)(1), unless the authorization is terminated or revoked.  Performed at [facility], [address], [place], [place] [address]   Surgical pcr screen     Status: None   Collection Time: 01/08/22  6:00 PM   Specimen: Nasal Mucosa; Nasal Swab  Result Value Ref Range Status   MRSA, PCR NEGATIVE NEGATIVE Final   Staphylococcus aureus NEGATIVE NEGATIVE Final    Comment: (NOTE) The Xpert SA Assay (FDA approved for NASAL specimens in patients  4 years of age and older), is one component of a comprehensive surveillance program. It is not intended to diagnose infection nor to guide or monitor treatment. Performed at [facility], [address] [place], [place] [address]   Aerobic/Anaerobic Culture w Gram Stain (surgical/deep wound)     Status: None (Preliminary result)   Collection Time: 01/09/22  1:53 PM   Specimen: PATH Other; Tissue  Result Value Ref Range Status   Specimen Description   Final    TISSUE Performed at [facility], [address], [place], [place] [address]    Special Requests   Final    LEFT FIFTH METATARSEL Performed at [facility], [address] [place], [place] [address]    Gram Stain   Final    NO WBC SEEN NO ORGANISMS SEEN Performed at [facility], [address], [place], [place] [address]    Culture PENDING  Incomplete   Report Status PENDING  Incomplete    Labs: CBC: Recent Labs  Lab 01/07/22 1044 01/08/22 0525  WBC 12.1* 11.8*  NEUTROABS 10.4*  --   HGB 12.4 10.1*  HCT 38.3 31.6*  MCV 94.6 93.2  PLT 208 174   Basic Metabolic Panel: Recent Labs  Lab 01/07/22 1044 01/07/22 1052 01/07/22 2011 01/08/22 0525 01/09/22 0847 01/10/22 0538  NA 138  --   --  135 133* 139  K 3.0*  --   --  2.8* 4.2 3.7  CL 99  --   --  97* 101 107  CO2 29  --   --  30 25 27   GLUCOSE 245*  --   --  246* 285* 110*  BUN 16  --   --  15 21 16   CREATININE 0.62  --   --  0.54 0.74 0.52  CALCIUM 8.8*  --   --  8.2* 7.8* 8.5*  MG  --  1.7  --   --  1.6* 2.0  PHOS  --   --  2.4*  --   --   --    Liver Function Tests: Recent Labs  Lab 01/07/22 1044  AST 42*  ALT 17  ALKPHOS 76  BILITOT 1.4*  PROT 7.2  ALBUMIN 3.7   CBG: Recent Labs  Lab 01/09/22 1245 01/09/22 1424 01/09/22 1710 01/09/22 1945 01/10/22 0835  GLUCAP 124* 110* 103* 102* 104*    Discharge time spent: greater than 30 minutes.  Signed: 01/11/22, MD [facility] 01/10/2022

## 2022-01-10 NOTE — Anesthesia Postprocedure Evaluation (Signed)
Anesthesia Post Note  Patient: [name]  Procedure(s) Performed: IRRIGATION AND DEBRIDEMENT FOOT WITH BONE BIOPSY (Left: Foot)  Patient location during evaluation: PACU Anesthesia Type: General Level of consciousness: awake and alert Pain management: pain level controlled Vital Signs Assessment: post-procedure vital signs reviewed and stable Respiratory status: spontaneous breathing, nonlabored ventilation, respiratory function stable and patient connected to nasal cannula oxygen Cardiovascular status: blood pressure returned to baseline and stable Postop Assessment: no apparent nausea or vomiting Anesthetic complications: no   No notable events documented.   Last Vitals:  Vitals:   01/09/22 2032 01/10/22 0639  BP: (!) 132/59 (!) 127/48  Pulse: 88 73  Resp: 16 16  Temp: 36.7 C 36.6 C  SpO2: 98% 97%    Last Pain:  Vitals:   01/10/22 0639  TempSrc: Oral  PainSc:                  Yevette Edwards

## 2022-01-10 NOTE — Evaluation (Signed)
Occupational Therapy Evaluation Patient Details Name: [name] MRN: [id number] DOB: 03/25/1933 Today's Date: 01/10/2022   History of Present Illness Pt is an 88 year old female admitted with AMS secondary to hypoglycemia, left lateral foot wound; PMH significant for non-insulin-dependent diabetes mellitus, hyperlipidemia, hypertension   Clinical Impression   Chart reviewed, pt greeted in bed, oriented to self only. Pt is agreeable to OT evaluation. Pt son, [name] contacted via phone after evaluation, reports pt mother is not at baseline cognitively. Physically she appears to be close to baseline. She requires MAX A for tranfers to manual wheelchair from family members, can then operate mwc with RLE. Pt requires assistance for all ADL, is able to feed herself after set up. Pt presents with deficits in strength, endurance, activity tolerance, balance all affecting safe and optimal ADL completion. Pt requires MAX-TOTAL A for lateral scoot/squat pivot transfer to bedside chair. Because pt is at/near baseline status for ADL, recommend discharge home with home health OT with frequent/constant supervision. OT will continue to follow acutely.      Recommendations for follow up therapy are one component of a multi-disciplinary discharge planning process, led by the attending physician.  Recommendations may be updated based on patient status, additional functional criteria and insurance authorization.   Follow Up Recommendations  Home health OT     Assistance Recommended at Discharge Frequent or constant Supervision/Assistance  Patient can return home with the following A lot of help with walking and/or transfers;A lot of help with bathing/dressing/bathroom    Functional Status Assessment     Equipment Recommendations  None recommended by OT (pt has recommended equipment at home)    Recommendations for Other Services       Precautions / Restrictions Precautions Precautions:  Fall Precaution Comments: old L hemi Restrictions Weight Bearing Restrictions: No (per podiatry pt can technicially weight bear through LLE; existing contracture causes pt knee to remain in a degree of flexion)      Mobility Bed Mobility Overal bed mobility: Needs Assistance Bed Mobility: Supine to Sit     Supine to sit: Max assist, HOB elevated     General bed mobility comments: step by step vcs    Transfers Overall transfer level: Needs assistance   Transfers: Bed to chair/wheelchair/BSC            Lateral/Scoot Transfers: Max assist, Total assist        Balance Overall balance assessment: Needs assistance Sitting-balance support: Feet supported Sitting balance-Leahy Scale: Poor   Postural control: Posterior lean   Standing balance-Leahy Scale: Zero                             ADL either performed or assessed with clinical judgement   ADL Overall ADL's : Needs assistance/impaired     Grooming: Wash/dry hands;Wash/dry face;Maximal assistance   Upper Body Bathing: Maximal assistance   Lower Body Bathing: Maximal assistance   Upper Body Dressing : Maximal assistance   Lower Body Dressing: Maximal assistance   Toilet Transfer: Maximal assistance;Squat-pivot Toilet Transfer Details (indicate cue type and reason): simulated to bedside chair, squat pivot with L arm rest down with transfer to the R Toileting- Clothing Manipulation and Hygiene: Maximal assistance;Total assistance;Bed level               Vision Patient Visual Report: No change from baseline       Perception     Praxis      Pertinent  Vitals/Pain Pain Assessment Pain Assessment: No/denies pain     Hand Dominance     Extremity/Trunk Assessment Upper Extremity Assessment Upper Extremity Assessment: LUE deficits/detail LUE Deficits / Details: old Left hemi in significant flexor synergy LUE Coordination: decreased fine motor;decreased gross motor   Lower Extremity  Assessment Lower Extremity Assessment: LLE deficits/detail LLE Deficits / Details: hip in external rotation and flexion, knee in flexion; limited by contractures       Communication Communication Communication: No difficulties   Cognition Arousal/Alertness: Awake/alert Behavior During Therapy: Flat affect Overall Cognitive Status: Impaired/Different from baseline Area of Impairment: Orientation, Attention, Memory, Following commands, Safety/judgement, Awareness, Problem solving                 Orientation Level: Disoriented to, Place, Time, Situation Current Attention Level: Focused Memory: Decreased short-term memory Following Commands: Follows one step commands with increased time Safety/Judgement: Decreased awareness of safety, Decreased awareness of deficits Awareness: Intellectual Problem Solving: Slow processing, Decreased initiation, Difficulty sequencing, Requires verbal cues, Requires tactile cues General Comments: pt son endorses pt is not at baseline cognitive status     General Comments  vital signs monitored, appear stable throughout    Exercises Other Exercises Other Exercises: edu re: pt and pt son (on the phone) re role of OT, role of rehab, discharge recommendations, home safety, falls prevention   Shoulder Instructions      Home Living Family/patient expects to be discharged to:: Private residence Living Arrangements: Alone Available Help at Discharge: Family;Available PRN/intermittently (son lives behind patient, provide assistance throughout the day and for transfer in and out of bed) Type of Home: House Home Access: Stairs to enter [employer] of Steps: 1 Entrance Stairs-Rails: None Home Layout: One level     Bathroom Shower/Tub: Sponge bathes at baseline   Bathroom Toilet: Handicapped height     Home Equipment: Teacher, English as a foreign language (2 wheels);Wheelchair - manual          Prior Functioning/Environment Prior Level of  Function : Needs assist       Physical Assist : Mobility (physical);ADLs (physical) Mobility (physical): Transfers ADLs (physical): Bathing;Dressing;Toileting;IADLs Mobility Comments: MAX A to transfer into/out of wheelchair. Pt wheels herself around with R foot household distances in [place]. ADLs Comments: pt family asissts in dressing, grooming, toileting tasks. Pt is a MAX A transfer to bsc and to mwc per son report; assist for all IADLs        OT Problem List: Decreased strength;Decreased activity tolerance;Decreased knowledge of use of DME or AE      OT Treatment/Interventions: Self-care/ADL training    OT Goals(Current goals can be found in the care plan section) Acute Rehab OT Goals Patient Stated Goal: go home OT Goal Formulation: With patient Time For Goal Achievement: 01/24/22 Potential to Achieve Goals: Good ADL Goals Pt Will Perform Grooming: with min assist;sitting Pt Will Transfer to Toilet: with max assist  OT Frequency: Min 2X/week    Co-evaluation              AM-PAC OT "6 Clicks" Daily Activity     Outcome Measure Help from another person eating meals?: A Lot Help from another person taking care of personal grooming?: A Lot Help from another person toileting, which includes using toliet, bedpan, or urinal?: Total Help from another person bathing (including washing, rinsing, drying)?: Total Help from another person to put on and taking off regular upper body clothing?: A Lot Help from another person to put on and taking off regular lower body  clothing?: Total 6 Click Score: 9   End of Session Nurse Communication: Mobility status (team re: recommendations)  Activity Tolerance: Patient tolerated treatment well Patient left: in chair;with call bell/phone within reach;with chair alarm set  OT Visit Diagnosis: Unsteadiness on feet (R26.81);Muscle weakness (generalized) (M62.81)                Time: 9518-8416 OT Time Calculation (min): 25 min Charges:  OT  General Charges $OT Visit: 1 Visit OT Evaluation $OT Eval Moderate Complexity: 1 Mod Oleta Mouse, OTD OTR/L  01/10/22, 3:52 PM

## 2022-01-10 NOTE — TOC Progression Note (Signed)
Transition of Care [facility]) - Progression Note    Patient Details  Name: [name] MRN: [id number] Date of Birth: Jul 10, 1933  Transition of Care [facility]) CM/SW Contact  Truddie Hidden, RN Phone Number: 01/10/2022, 10:49 AM  Clinical Narrative:    Therapy evaluation pending. Will reassess for changes in disposition and needs post therapy evaluation.         Expected Discharge Plan and Services           Expected Discharge Date: 01/10/22                                     Social Determinants of Health (SDOH) Interventions    Readmission Risk Interventions     No data to display

## 2022-01-10 NOTE — Progress Notes (Signed)
Daily Progress Note   Subjective  - 1 Day Post-Op  Left fifth metatarsal bone biopsy and ulcer.  She is not complain of any pain.  Has been resting comfortably.  There has been some drainage from the bandage per nursing.  Objective Vitals:   01/09/22 2032 01/10/22 0639 01/10/22 0807 01/10/22 1540  BP: (!) 132/59 (!) 127/48 137/64 133/64  Pulse: 88 73 86 89  Resp: 16 16 18 19   Temp: 98.1 F (36.7 C) 97.9 F (36.6 C) 98.2 F (36.8 C) 97.6 F (36.4 C)  TempSrc: Oral Oral    SpO2: 98% 97% 100% 100%  Weight:      Height:        Physical Exam: Dressing was removed.  Little bit of hematoma was formed under the dressing.  We cleansed this up and flushed it with saline.  New bandage was applied.  Pathology and culture are still pending  Laboratory CBC    Component Value Date/Time   WBC 11.8 (H) 01/08/2022 0525   HGB 10.1 (L) 01/08/2022 0525   HGB 8.6 (L) 09/25/2013 0410   HCT 31.6 (L) 01/08/2022 0525   HCT 27.0 (L) 09/24/2013 0409   PLT 174 01/08/2022 0525   PLT 192 09/24/2013 0409    BMET    Component Value Date/Time   NA 139 01/10/2022 0538   NA 143 09/24/2013 0409   K 3.7 01/10/2022 0538   K 3.6 09/24/2013 0409   CL 107 01/10/2022 0538   CL 108 (H) 09/24/2013 0409   CO2 27 01/10/2022 0538   CO2 27 09/24/2013 0409   GLUCOSE 110 (H) 01/10/2022 0538   GLUCOSE 222 (H) 09/24/2013 0409   BUN 16 01/10/2022 0538   BUN 7 09/24/2013 0409   CREATININE 0.52 01/10/2022 0538   CREATININE 0.77 09/24/2013 0409   CALCIUM 8.5 (L) 01/10/2022 0538   CALCIUM 7.7 (L) 09/24/2013 0409   GFRNONAA >60 01/10/2022 0538   GFRNONAA >60 09/24/2013 0409   GFRAA >60 09/26/2019 1952   GFRAA >60 09/24/2013 0409    Assessment/Planning: Left lateral fifth metatarsal ulceration  Awaiting biopsy of bone just to evaluate for evaluate for bone infection.  If this is benign would recommend just continued local wound care. Will follow while in house.  09/26/2013 A  01/10/2022, 6:32  PM

## 2022-01-10 NOTE — Progress Notes (Signed)
Talked with the patient's son, who prefer patient to get some rehab before discharge.  Will obtain PT/OT, TOC consult.

## 2022-01-11 LAB — GLUCOSE, CAPILLARY
Glucose-Capillary: 102 mg/dL — ABNORMAL HIGH (ref 70–99)
Glucose-Capillary: 96 mg/dL (ref 70–99)
Glucose-Capillary: 69 mg/dL — ABNORMAL LOW (ref 70–99)

## 2022-01-11 NOTE — TOC Initial Note (Signed)
Transition of Care [facility]) - Initial/Assessment Note    Patient Details  Name: [name] MRN: [id number] Date of Birth: May 17, 1933  [facility] CM/SW Contact:    Maree Krabbe, LCSW Phone Number: 01/11/2022, 10:37 AM  Clinical Narrative:    Pt's Son [name] present at beside. CSW spoke with [name]. [name] states pt will dc home with home. [name] declines any DME. [name] is agreeable for [facility] with no agency preference.   Frances Furbish will service pt. Anticipated dc today.   [name] will transport pt home.  No additional needs at this time.RN and MD updated.               Expected Discharge Plan: Home w Home Health Services Barriers to Discharge: No Barriers Identified   Patient Goals and CMS Choice Patient states their goals for this hospitalization and ongoing recovery are:: pt to get better- son   Choice offered to / list presented to : Adult Children  Expected Discharge Plan and Services Expected Discharge Plan: Home w Home Health Services In-house Referral: Clinical Social Work   Post Acute Care Choice: Home Health Living arrangements for the past 2 months: Single Family Home Expected Discharge Date: 01/11/22                         HH Arranged: PT, OT HH Agency: [facility] Date [facility] Agency Contacted: 01/11/22 Time HH Agency Contacted: 1037 Representative spoke with at [facility] Agency: Kandee Keen  Prior Living Arrangements/Services Living arrangements for the past 2 months: Single Family Home Lives with:: Adult Children Patient language and need for interpreter reviewed:: No Do you feel safe going back to the place where you live?: Yes      Need for Family Participation in Patient Care: Yes (Comment) Care giver support system in place?: Yes (comment)   Criminal Activity/Legal Involvement Pertinent to Current Situation/Hospitalization: No - Comment as needed  Activities of Daily Living   ADL Screening (condition at time of admission) Patient's  cognitive ability adequate to safely complete daily activities?: No Is the patient deaf or have difficulty hearing?: No Does the patient have difficulty seeing, even when wearing glasses/contacts?: No Does the patient have difficulty concentrating, remembering, or making decisions?: Yes Patient able to express need for assistance with ADLs?: Yes Does the patient have difficulty dressing or bathing?: Yes Independently performs ADLs?: No Communication: Needs assistance Is this a change from baseline?: Pre-admission baseline Dressing (OT): Needs assistance Is this a change from baseline?: Pre-admission baseline Grooming: Needs assistance Is this a change from baseline?: Pre-admission baseline Feeding: Needs assistance Is this a change from baseline?: Pre-admission baseline Bathing: Needs assistance Is this a change from baseline?: Pre-admission baseline Toileting: Needs assistance Is this a change from baseline?: Pre-admission baseline In/Out Bed: Needs assistance Is this a change from baseline?: Pre-admission baseline Walks in Home: Dependent (patient says she cannot walk left leg and arm contracted) Is this a change from baseline?: Pre-admission baseline Does the patient have difficulty walking or climbing stairs?: Yes Weakness of Legs: Left Weakness of Arms/Hands: Left  Permission Sought/Granted Permission sought to share information with : Family Supports Permission granted to share information with : Yes, Verbal Permission Granted  Share Information with NAME: [name]     Permission granted to share info w Relationship: son     Emotional Assessment Appearance:: Appears stated age Attitude/Demeanor/Rapport: Engaged Affect (typically observed): Accepting Orientation: : Oriented to  Time, Oriented to Situation, Oriented  to Place, Oriented to Self Alcohol / Substance Use: Not Applicable Psych Involvement: No (comment)  Admission diagnosis:  Transient alteration of awareness  [R40.4] Weakness [R53.1] Hypoglycemia [E16.2] Leg wound, left [S81.802A] Patient Active Problem List   Diagnosis Date Noted   Hemiplegia affecting left side in left-dominant patient as late effect of cerebrovascular disease (HCC) 01/10/2022   Vaginal candidiasis 01/10/2022   Leg wound, left 01/08/2022   Hypoglycemia 01/07/2022   Leukocytosis 01/07/2022   MRI contraindicated due to metal implant 01/07/2022   Open wound of left foot 01/07/2022   Atherosclerosis of native arteries of the extremities with ulceration (HCC) 06/06/2021   Pressure injury of skin 06/01/2021   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   PAD (peripheral artery disease) (HCC) 05/17/2021   Right hemiparesis (HCC) 05/16/2021   Left leg cellulitis 05/15/2021   Hypokalemia 05/15/2021   CVA (cerebral vascular accident) (HCC) 11/14/2019   History of stroke 10/07/2019   Altered mental status 10/06/2019   Carotid artery disease (HCC) 09/28/2019   Left hemiparesis (HCC) 09/28/2019   UTI (urinary tract infection) 09/20/2019   Sepsis (HCC) 03/22/2018   Primary osteoarthritis of both hips 11/06/2017   Microalbuminuria 11/02/2017   Cystocele, midline 09/04/2017   Incomplete uterine prolapse 09/04/2017   Type 2 diabetes mellitus with microalbuminuria, without long-term current use of insulin (HCC) 07/27/2017   Ankle edema 04/27/2017   Pure hypercholesterolemia 03/30/2016   Malnutrition of moderate degree 01/15/2016   Dehydration    Incarcerated ventral hernia    Diabetic ketoacidosis without coma associated with type 2 diabetes mellitus (HCC)    Type 2 diabetes mellitus with polyneuropathy (HCC) 08/06/2015   Carpal tunnel syndrome, bilateral 07/10/2015   Osteopenia of multiple sites 07/10/2015   Arthralgia of multiple sites 06/27/2015   Mixed hyperlipidemia 11/06/2014   Hypomagnesemia 11/02/2013   Essential hypertension 11/02/2013   PCP:  Gracelyn Nurse, MD Pharmacy:   [facility] - [place], [place] - [address] [place] [address] Phone: [phone] Fax: [phone]  [id number] [place], [place] - [address] AT [facility] [address] [place] [address] [place] [address] Phone: [phone] Fax: [phone]     Social Determinants of Health (SDOH) Interventions    Readmission Risk Interventions     No data to display

## 2022-01-11 NOTE — Evaluation (Signed)
Physical Therapy Evaluation Patient Details Name: [name] MRN: [id number] DOB: Nov 06, 1933 Today's Date: 01/11/2022  History of Present Illness  Renarda Mullinix is an 87yoF who comes to [facility] on 01/07/22 via EMS after AMS at home, CBG:55. PMH: NIDDM, HTN, HLD, nonHealing ulcer of Left foot followed by podiatry. Pt taken for wound biopsy on 11/30 due to concerns of progression/infection.  Clinical Impression  Pt in bed on arrival, awake and resting. Pt interactive, pleasant, social, but has cognitive difficulty holding a conversation along more technical details. She reports her son cares for her, but her mother used to care for her as well. She asks for orientation to place as she does nto recognize her surroundings.TotalA required for coming to EOB, eventually able to balance self. TotalA bed to chair, pt attempts to assist, but unable to put forth much help. LUE and LLE spastic contractures are significantly limiting to postural variability and mobility. Pt happy and pleasant in recliner at EOS. Author assisted with meal order. Pt appears to be at her baseline lever of assistance/independence. Difficult to say if mentation is at baseline, but given lack of lethargy/irritability, seems within her typical cognitive abilities which likely fluctuate. Also should account for empirical effects of hospitalization on geriatric cognition, do not think being here for additional days would be helpful for her orientation, rather return to familiar location and routine would be most favorable. Will sign off at this time, no PT needs at this time. Family could probably benefit from hoyer lift at home if interested. HHPT could help with hoyer lift orientation and family education on limb contracture care.      Recommendations for follow up therapy are one component of a multi-disciplinary discharge planning process, led by the attending physician.  Recommendations may be updated based on patient status,  additional functional criteria and insurance authorization.  Follow Up Recommendations Home health PT (HHPT to assist with family education on mobliity techiniques and hoyer lift transfers) Can patient physically be transported by private vehicle: No    Assistance Recommended at Discharge Frequent or constant Supervision/Assistance  Patient can return home with the following  A lot of help with bathing/dressing/bathroom;A lot of help with walking and/or transfers;Assistance with feeding;Help with stairs or ramp for entrance;Assist for transportation;Direct supervision/assist for medications management;Assistance with cooking/housework;Direct supervision/assist for financial management    Equipment Recommendations  (mechanical lift for family to assist with trasnfers)  Recommendations for Other Services       Functional Status Assessment Patient has had a recent decline in their functional status and demonstrates the ability to make significant improvements in function in a reasonable and predictable amount of time.     Precautions / Restrictions Precautions Precautions: Fall Precaution Comments: old L hemi Restrictions Weight Bearing Restrictions: No      Mobility  Bed Mobility Overal bed mobility: Needs Assistance Bed Mobility: Supine to Sit     Supine to sit: Total assist     General bed mobility comments: not following cues this morning, apraxic; able ot sit EOB with balance after ~60sec    Transfers Overall transfer level: Needs assistance   Transfers: Bed to chair/wheelchair/BSC            Lateral/Scoot Transfers: Max assist, +2 physical assistance, From elevated surface General transfer comment: Author uses chux pad in sling-like fashion to facilitate ischael lift and lateral transfer to recliner, pt makes attempts to use Rt foot plant to stabilize    Ambulation/Gait  Stairs            Wheelchair Mobility    Modified Rankin  (Stroke Patients Only)       Balance                                             Pertinent Vitals/Pain      Home Living Family/patient expects to be discharged to:: Private residence Living Arrangements: Alone Available Help at Discharge: Family;Available PRN/intermittently Type of Home: House Home Access: Stairs to enter Entrance Stairs-Rails: None Entrance Stairs-Number of Steps: 1   Home Layout: One level Home Equipment: Public relations account executive (2 wheels);Wheelchair - manual      Prior Function Prior Level of Function : Needs assist             Mobility Comments: MAX A to transfer into/out of wheelchair. Pt can propel self in home with R foot household distances in [place]. ADLs Comments: pt family asissts in dressing, grooming, toileting tasks. Pt is a MAX A transfer to bsc and to mwc per son report; assist for all IADLs     Hand Dominance        Extremity/Trunk Assessment                Communication      Cognition Arousal/Alertness: Awake/alert                                              General Comments      Exercises     Assessment/Plan    PT Assessment All further PT needs can be met in the next venue of care;Patient does not need any further PT services  PT Problem List Decreased cognition;Decreased knowledge of use of DME;Decreased safety awareness;Decreased knowledge of precautions;Impaired tone;Decreased strength;Decreased range of motion;Decreased mobility       PT Treatment Interventions Patient/family education;Therapeutic activities    PT Goals (Current goals can be found in the Care Plan section)  Acute Rehab PT Goals PT Goal Formulation: Patient unable to participate in goal setting    Frequency       Co-evaluation               AM-PAC PT "6 Clicks" Mobility  Outcome Measure Help needed turning from your back to your side while in a flat bed without using bedrails?: Total Help  needed moving from lying on your back to sitting on the side of a flat bed without using bedrails?: Total Help needed moving to and from a bed to a chair (including a wheelchair)?: Total Help needed standing up from a chair using your arms (e.g., wheelchair or bedside chair)?: Total Help needed to walk in hospital room?: Total Help needed climbing 3-5 steps with a railing? : Total 6 Click Score: 6    End of Session   Activity Tolerance: Patient tolerated treatment well;No increased pain Patient left: in chair;with call bell/phone within reach;with chair alarm set Nurse Communication: Mobility status PT Visit Diagnosis: Apraxia (R48.2);Other abnormalities of gait and mobility (R26.89)    Time: 7353-2992 PT Time Calculation (min) (ACUTE ONLY): 20 min   Charges:   PT Evaluation $PT Eval Low Complexity: 1 Low        9:00 AM, 01/11/22 Etta Grandchild,  PT, DPT Physical Therapist - [facility]  [phone] ([place])    Suetta Hoffmeister C 01/11/2022, 8:54 AM

## 2022-01-11 NOTE — Plan of Care (Signed)

## 2022-01-11 NOTE — Discharge Summary (Addendum)
Please see discharge summary by Dr. Marrion Coy on 01/10/2022.  Pt was originally discharged by Dr. Chipper Herb on 01/10/2022, however, family wanted pt to be considered for SNF rehab, so PT/OT ordered, who reported pt to be at her baseline.  Pt is therefore discharged home with [facility] PT/OT/RN/aide/SW today.

## 2022-01-11 NOTE — Discharge Instructions (Signed)
Dressing changes: Family to perform 3 x's per week dry gauze dressing.  OK to flush wound with saline wound wash (can be found in pharmacy OTC) and apply 3x3 gauze and gauze wrap.

## 2022-01-13 ENCOUNTER — Ambulatory Visit: Payer: Medicare HMO | Admitting: Physician Assistant

## 2022-01-13 LAB — SURGICAL PATHOLOGY

## 2022-01-14 LAB — AEROBIC/ANAEROBIC CULTURE W GRAM STAIN (SURGICAL/DEEP WOUND)
Culture: NO GROWTH
Gram Stain: NONE SEEN

## 2022-01-16 DIAGNOSIS — E119 Type 2 diabetes mellitus without complications: Secondary | ICD-10-CM | POA: Diagnosis not present

## 2022-01-16 DIAGNOSIS — L97524 Non-pressure chronic ulcer of other part of left foot with necrosis of bone: Secondary | ICD-10-CM | POA: Diagnosis not present

## 2022-01-17 DIAGNOSIS — Z09 Encounter for follow-up examination after completed treatment for conditions other than malignant neoplasm: Secondary | ICD-10-CM | POA: Diagnosis not present

## 2022-01-17 DIAGNOSIS — R809 Proteinuria, unspecified: Secondary | ICD-10-CM | POA: Diagnosis not present

## 2022-01-17 DIAGNOSIS — E1129 Type 2 diabetes mellitus with other diabetic kidney complication: Secondary | ICD-10-CM | POA: Diagnosis not present

## 2022-03-18 DIAGNOSIS — E1129 Type 2 diabetes mellitus with other diabetic kidney complication: Secondary | ICD-10-CM | POA: Diagnosis not present

## 2022-03-18 DIAGNOSIS — R809 Proteinuria, unspecified: Secondary | ICD-10-CM | POA: Diagnosis not present

## 2022-03-25 DIAGNOSIS — E7800 Pure hypercholesterolemia, unspecified: Secondary | ICD-10-CM | POA: Diagnosis not present

## 2022-03-25 DIAGNOSIS — Z0000 Encounter for general adult medical examination without abnormal findings: Secondary | ICD-10-CM | POA: Diagnosis not present

## 2022-03-25 DIAGNOSIS — Z1331 Encounter for screening for depression: Secondary | ICD-10-CM | POA: Diagnosis not present

## 2022-03-25 DIAGNOSIS — I69952 Hemiplegia and hemiparesis following unspecified cerebrovascular disease affecting left dominant side: Secondary | ICD-10-CM | POA: Diagnosis not present

## 2022-03-25 DIAGNOSIS — R809 Proteinuria, unspecified: Secondary | ICD-10-CM | POA: Diagnosis not present

## 2022-03-25 DIAGNOSIS — Z0001 Encounter for general adult medical examination with abnormal findings: Secondary | ICD-10-CM | POA: Diagnosis not present

## 2022-03-25 DIAGNOSIS — I10 Essential (primary) hypertension: Secondary | ICD-10-CM | POA: Diagnosis not present

## 2022-03-25 DIAGNOSIS — E1142 Type 2 diabetes mellitus with diabetic polyneuropathy: Secondary | ICD-10-CM | POA: Diagnosis not present

## 2022-03-25 DIAGNOSIS — E1129 Type 2 diabetes mellitus with other diabetic kidney complication: Secondary | ICD-10-CM | POA: Diagnosis not present

## 2022-05-27 DIAGNOSIS — F0280 Dementia in other diseases classified elsewhere without behavioral disturbance: Secondary | ICD-10-CM | POA: Diagnosis not present

## 2022-05-27 DIAGNOSIS — G309 Alzheimer's disease, unspecified: Secondary | ICD-10-CM | POA: Diagnosis not present

## 2022-05-27 DIAGNOSIS — L97522 Non-pressure chronic ulcer of other part of left foot with fat layer exposed: Secondary | ICD-10-CM | POA: Diagnosis not present

## 2022-05-27 DIAGNOSIS — I69952 Hemiplegia and hemiparesis following unspecified cerebrovascular disease affecting left dominant side: Secondary | ICD-10-CM | POA: Diagnosis not present

## 2022-06-23 DIAGNOSIS — D519 Vitamin B12 deficiency anemia, unspecified: Secondary | ICD-10-CM | POA: Diagnosis not present

## 2022-06-23 DIAGNOSIS — E8770 Fluid overload, unspecified: Secondary | ICD-10-CM | POA: Diagnosis not present

## 2022-06-23 DIAGNOSIS — I10 Essential (primary) hypertension: Secondary | ICD-10-CM | POA: Diagnosis not present

## 2022-06-23 DIAGNOSIS — I639 Cerebral infarction, unspecified: Secondary | ICD-10-CM | POA: Diagnosis not present

## 2022-06-25 DIAGNOSIS — I639 Cerebral infarction, unspecified: Secondary | ICD-10-CM | POA: Diagnosis not present

## 2022-06-25 DIAGNOSIS — E118 Type 2 diabetes mellitus with unspecified complications: Secondary | ICD-10-CM | POA: Diagnosis not present

## 2022-06-25 DIAGNOSIS — F0390 Unspecified dementia without behavioral disturbance: Secondary | ICD-10-CM | POA: Diagnosis not present

## 2022-06-25 DIAGNOSIS — I10 Essential (primary) hypertension: Secondary | ICD-10-CM | POA: Diagnosis not present

## 2022-06-26 DIAGNOSIS — L97524 Non-pressure chronic ulcer of other part of left foot with necrosis of bone: Secondary | ICD-10-CM | POA: Diagnosis not present

## 2022-06-26 DIAGNOSIS — E11621 Type 2 diabetes mellitus with foot ulcer: Secondary | ICD-10-CM | POA: Diagnosis not present

## 2022-06-27 DIAGNOSIS — S91301A Unspecified open wound, right foot, initial encounter: Secondary | ICD-10-CM | POA: Diagnosis not present

## 2022-06-27 DIAGNOSIS — S91002A Unspecified open wound, left ankle, initial encounter: Secondary | ICD-10-CM | POA: Diagnosis not present

## 2022-06-27 DIAGNOSIS — S81002A Unspecified open wound, left knee, initial encounter: Secondary | ICD-10-CM | POA: Diagnosis not present

## 2022-06-27 DIAGNOSIS — S91302A Unspecified open wound, left foot, initial encounter: Secondary | ICD-10-CM | POA: Diagnosis not present

## 2022-06-28 DIAGNOSIS — E11621 Type 2 diabetes mellitus with foot ulcer: Secondary | ICD-10-CM | POA: Diagnosis not present

## 2022-06-28 DIAGNOSIS — D649 Anemia, unspecified: Secondary | ICD-10-CM | POA: Diagnosis not present

## 2022-06-28 DIAGNOSIS — E7800 Pure hypercholesterolemia, unspecified: Secondary | ICD-10-CM | POA: Diagnosis not present

## 2022-06-30 DIAGNOSIS — I70203 Unspecified atherosclerosis of native arteries of extremities, bilateral legs: Secondary | ICD-10-CM | POA: Diagnosis not present

## 2022-06-30 DIAGNOSIS — R600 Localized edema: Secondary | ICD-10-CM | POA: Diagnosis not present

## 2022-06-30 DIAGNOSIS — E118 Type 2 diabetes mellitus with unspecified complications: Secondary | ICD-10-CM | POA: Diagnosis not present

## 2022-06-30 DIAGNOSIS — I10 Essential (primary) hypertension: Secondary | ICD-10-CM | POA: Diagnosis not present

## 2022-06-30 DIAGNOSIS — S91302A Unspecified open wound, left foot, initial encounter: Secondary | ICD-10-CM | POA: Diagnosis not present

## 2022-07-02 DIAGNOSIS — I70203 Unspecified atherosclerosis of native arteries of extremities, bilateral legs: Secondary | ICD-10-CM | POA: Diagnosis not present

## 2022-07-03 DIAGNOSIS — I739 Peripheral vascular disease, unspecified: Secondary | ICD-10-CM | POA: Diagnosis not present

## 2022-07-03 DIAGNOSIS — L97524 Non-pressure chronic ulcer of other part of left foot with necrosis of bone: Secondary | ICD-10-CM | POA: Diagnosis not present

## 2022-07-03 DIAGNOSIS — E11621 Type 2 diabetes mellitus with foot ulcer: Secondary | ICD-10-CM | POA: Diagnosis not present

## 2022-07-03 DIAGNOSIS — S91302A Unspecified open wound, left foot, initial encounter: Secondary | ICD-10-CM | POA: Diagnosis not present

## 2022-07-08 DIAGNOSIS — E11621 Type 2 diabetes mellitus with foot ulcer: Secondary | ICD-10-CM | POA: Diagnosis not present

## 2022-07-10 DIAGNOSIS — L97524 Non-pressure chronic ulcer of other part of left foot with necrosis of bone: Secondary | ICD-10-CM | POA: Diagnosis not present

## 2022-07-10 DIAGNOSIS — E11621 Type 2 diabetes mellitus with foot ulcer: Secondary | ICD-10-CM | POA: Diagnosis not present

## 2022-07-11 DIAGNOSIS — I739 Peripheral vascular disease, unspecified: Secondary | ICD-10-CM | POA: Diagnosis not present

## 2022-07-11 DIAGNOSIS — S91302A Unspecified open wound, left foot, initial encounter: Secondary | ICD-10-CM | POA: Diagnosis not present

## 2022-07-12 DIAGNOSIS — I69354 Hemiplegia and hemiparesis following cerebral infarction affecting left non-dominant side: Secondary | ICD-10-CM | POA: Diagnosis not present

## 2022-07-12 DIAGNOSIS — I6350 Cerebral infarction due to unspecified occlusion or stenosis of unspecified cerebral artery: Secondary | ICD-10-CM | POA: Diagnosis not present

## 2022-07-12 DIAGNOSIS — M6259 Muscle wasting and atrophy, not elsewhere classified, multiple sites: Secondary | ICD-10-CM | POA: Diagnosis not present

## 2022-07-12 DIAGNOSIS — R293 Abnormal posture: Secondary | ICD-10-CM | POA: Diagnosis not present

## 2022-07-12 DIAGNOSIS — M24531 Contracture, right wrist: Secondary | ICD-10-CM | POA: Diagnosis not present

## 2022-07-12 DIAGNOSIS — R2681 Unsteadiness on feet: Secondary | ICD-10-CM | POA: Diagnosis not present

## 2022-07-12 DIAGNOSIS — M24542 Contracture, left hand: Secondary | ICD-10-CM | POA: Diagnosis not present

## 2022-07-14 DIAGNOSIS — M6259 Muscle wasting and atrophy, not elsewhere classified, multiple sites: Secondary | ICD-10-CM | POA: Diagnosis not present

## 2022-07-14 DIAGNOSIS — I6350 Cerebral infarction due to unspecified occlusion or stenosis of unspecified cerebral artery: Secondary | ICD-10-CM | POA: Diagnosis not present

## 2022-07-14 DIAGNOSIS — M24531 Contracture, right wrist: Secondary | ICD-10-CM | POA: Diagnosis not present

## 2022-07-14 DIAGNOSIS — I69354 Hemiplegia and hemiparesis following cerebral infarction affecting left non-dominant side: Secondary | ICD-10-CM | POA: Diagnosis not present

## 2022-07-14 DIAGNOSIS — R2681 Unsteadiness on feet: Secondary | ICD-10-CM | POA: Diagnosis not present

## 2022-07-14 DIAGNOSIS — R293 Abnormal posture: Secondary | ICD-10-CM | POA: Diagnosis not present

## 2022-07-14 DIAGNOSIS — M24542 Contracture, left hand: Secondary | ICD-10-CM | POA: Diagnosis not present

## 2022-07-15 DIAGNOSIS — I6350 Cerebral infarction due to unspecified occlusion or stenosis of unspecified cerebral artery: Secondary | ICD-10-CM | POA: Diagnosis not present

## 2022-07-15 DIAGNOSIS — S91302A Unspecified open wound, left foot, initial encounter: Secondary | ICD-10-CM | POA: Diagnosis not present

## 2022-07-15 DIAGNOSIS — M24531 Contracture, right wrist: Secondary | ICD-10-CM | POA: Diagnosis not present

## 2022-07-15 DIAGNOSIS — R2681 Unsteadiness on feet: Secondary | ICD-10-CM | POA: Diagnosis not present

## 2022-07-15 DIAGNOSIS — R293 Abnormal posture: Secondary | ICD-10-CM | POA: Diagnosis not present

## 2022-07-15 DIAGNOSIS — M6259 Muscle wasting and atrophy, not elsewhere classified, multiple sites: Secondary | ICD-10-CM | POA: Diagnosis not present

## 2022-07-15 DIAGNOSIS — I69354 Hemiplegia and hemiparesis following cerebral infarction affecting left non-dominant side: Secondary | ICD-10-CM | POA: Diagnosis not present

## 2022-07-15 DIAGNOSIS — M24542 Contracture, left hand: Secondary | ICD-10-CM | POA: Diagnosis not present

## 2022-07-16 DIAGNOSIS — M24531 Contracture, right wrist: Secondary | ICD-10-CM | POA: Diagnosis not present

## 2022-07-16 DIAGNOSIS — I6350 Cerebral infarction due to unspecified occlusion or stenosis of unspecified cerebral artery: Secondary | ICD-10-CM | POA: Diagnosis not present

## 2022-07-16 DIAGNOSIS — R2681 Unsteadiness on feet: Secondary | ICD-10-CM | POA: Diagnosis not present

## 2022-07-16 DIAGNOSIS — I69354 Hemiplegia and hemiparesis following cerebral infarction affecting left non-dominant side: Secondary | ICD-10-CM | POA: Diagnosis not present

## 2022-07-16 DIAGNOSIS — M24542 Contracture, left hand: Secondary | ICD-10-CM | POA: Diagnosis not present

## 2022-07-16 DIAGNOSIS — R293 Abnormal posture: Secondary | ICD-10-CM | POA: Diagnosis not present

## 2022-07-16 DIAGNOSIS — M6259 Muscle wasting and atrophy, not elsewhere classified, multiple sites: Secondary | ICD-10-CM | POA: Diagnosis not present

## 2022-07-17 DIAGNOSIS — M24542 Contracture, left hand: Secondary | ICD-10-CM | POA: Diagnosis not present

## 2022-07-17 DIAGNOSIS — I6350 Cerebral infarction due to unspecified occlusion or stenosis of unspecified cerebral artery: Secondary | ICD-10-CM | POA: Diagnosis not present

## 2022-07-17 DIAGNOSIS — E11621 Type 2 diabetes mellitus with foot ulcer: Secondary | ICD-10-CM | POA: Diagnosis not present

## 2022-07-17 DIAGNOSIS — L97524 Non-pressure chronic ulcer of other part of left foot with necrosis of bone: Secondary | ICD-10-CM | POA: Diagnosis not present

## 2022-07-17 DIAGNOSIS — M24531 Contracture, right wrist: Secondary | ICD-10-CM | POA: Diagnosis not present

## 2022-07-17 DIAGNOSIS — I69354 Hemiplegia and hemiparesis following cerebral infarction affecting left non-dominant side: Secondary | ICD-10-CM | POA: Diagnosis not present

## 2022-07-17 DIAGNOSIS — R293 Abnormal posture: Secondary | ICD-10-CM | POA: Diagnosis not present

## 2022-07-17 DIAGNOSIS — M6259 Muscle wasting and atrophy, not elsewhere classified, multiple sites: Secondary | ICD-10-CM | POA: Diagnosis not present

## 2022-07-17 DIAGNOSIS — R2681 Unsteadiness on feet: Secondary | ICD-10-CM | POA: Diagnosis not present

## 2022-07-18 DIAGNOSIS — I6350 Cerebral infarction due to unspecified occlusion or stenosis of unspecified cerebral artery: Secondary | ICD-10-CM | POA: Diagnosis not present

## 2022-07-18 DIAGNOSIS — M24531 Contracture, right wrist: Secondary | ICD-10-CM | POA: Diagnosis not present

## 2022-07-18 DIAGNOSIS — R293 Abnormal posture: Secondary | ICD-10-CM | POA: Diagnosis not present

## 2022-07-18 DIAGNOSIS — I69354 Hemiplegia and hemiparesis following cerebral infarction affecting left non-dominant side: Secondary | ICD-10-CM | POA: Diagnosis not present

## 2022-07-18 DIAGNOSIS — M6259 Muscle wasting and atrophy, not elsewhere classified, multiple sites: Secondary | ICD-10-CM | POA: Diagnosis not present

## 2022-07-18 DIAGNOSIS — M24542 Contracture, left hand: Secondary | ICD-10-CM | POA: Diagnosis not present

## 2022-07-18 DIAGNOSIS — R2681 Unsteadiness on feet: Secondary | ICD-10-CM | POA: Diagnosis not present

## 2022-07-19 DIAGNOSIS — I6350 Cerebral infarction due to unspecified occlusion or stenosis of unspecified cerebral artery: Secondary | ICD-10-CM | POA: Diagnosis not present

## 2022-07-19 DIAGNOSIS — R2681 Unsteadiness on feet: Secondary | ICD-10-CM | POA: Diagnosis not present

## 2022-07-19 DIAGNOSIS — I69354 Hemiplegia and hemiparesis following cerebral infarction affecting left non-dominant side: Secondary | ICD-10-CM | POA: Diagnosis not present

## 2022-07-19 DIAGNOSIS — M6259 Muscle wasting and atrophy, not elsewhere classified, multiple sites: Secondary | ICD-10-CM | POA: Diagnosis not present

## 2022-07-19 DIAGNOSIS — R293 Abnormal posture: Secondary | ICD-10-CM | POA: Diagnosis not present

## 2022-07-19 DIAGNOSIS — M24531 Contracture, right wrist: Secondary | ICD-10-CM | POA: Diagnosis not present

## 2022-07-19 DIAGNOSIS — M24542 Contracture, left hand: Secondary | ICD-10-CM | POA: Diagnosis not present

## 2022-07-21 DIAGNOSIS — M24542 Contracture, left hand: Secondary | ICD-10-CM | POA: Diagnosis not present

## 2022-07-21 DIAGNOSIS — I639 Cerebral infarction, unspecified: Secondary | ICD-10-CM | POA: Diagnosis not present

## 2022-07-21 DIAGNOSIS — R293 Abnormal posture: Secondary | ICD-10-CM | POA: Diagnosis not present

## 2022-07-21 DIAGNOSIS — S91302A Unspecified open wound, left foot, initial encounter: Secondary | ICD-10-CM | POA: Diagnosis not present

## 2022-07-21 DIAGNOSIS — M24531 Contracture, right wrist: Secondary | ICD-10-CM | POA: Diagnosis not present

## 2022-07-21 DIAGNOSIS — I739 Peripheral vascular disease, unspecified: Secondary | ICD-10-CM | POA: Diagnosis not present

## 2022-07-21 DIAGNOSIS — R2681 Unsteadiness on feet: Secondary | ICD-10-CM | POA: Diagnosis not present

## 2022-07-21 DIAGNOSIS — I69354 Hemiplegia and hemiparesis following cerebral infarction affecting left non-dominant side: Secondary | ICD-10-CM | POA: Diagnosis not present

## 2022-07-21 DIAGNOSIS — M6259 Muscle wasting and atrophy, not elsewhere classified, multiple sites: Secondary | ICD-10-CM | POA: Diagnosis not present

## 2022-07-21 DIAGNOSIS — I6350 Cerebral infarction due to unspecified occlusion or stenosis of unspecified cerebral artery: Secondary | ICD-10-CM | POA: Diagnosis not present

## 2022-07-22 DIAGNOSIS — M24531 Contracture, right wrist: Secondary | ICD-10-CM | POA: Diagnosis not present

## 2022-07-22 DIAGNOSIS — R2681 Unsteadiness on feet: Secondary | ICD-10-CM | POA: Diagnosis not present

## 2022-07-22 DIAGNOSIS — D649 Anemia, unspecified: Secondary | ICD-10-CM | POA: Diagnosis not present

## 2022-07-22 DIAGNOSIS — E559 Vitamin D deficiency, unspecified: Secondary | ICD-10-CM | POA: Diagnosis not present

## 2022-07-22 DIAGNOSIS — M24542 Contracture, left hand: Secondary | ICD-10-CM | POA: Diagnosis not present

## 2022-07-22 DIAGNOSIS — I6350 Cerebral infarction due to unspecified occlusion or stenosis of unspecified cerebral artery: Secondary | ICD-10-CM | POA: Diagnosis not present

## 2022-07-22 DIAGNOSIS — I69354 Hemiplegia and hemiparesis following cerebral infarction affecting left non-dominant side: Secondary | ICD-10-CM | POA: Diagnosis not present

## 2022-07-22 DIAGNOSIS — R293 Abnormal posture: Secondary | ICD-10-CM | POA: Diagnosis not present

## 2022-07-22 DIAGNOSIS — M6259 Muscle wasting and atrophy, not elsewhere classified, multiple sites: Secondary | ICD-10-CM | POA: Diagnosis not present

## 2022-07-23 DIAGNOSIS — R293 Abnormal posture: Secondary | ICD-10-CM | POA: Diagnosis not present

## 2022-07-23 DIAGNOSIS — S91302A Unspecified open wound, left foot, initial encounter: Secondary | ICD-10-CM | POA: Diagnosis not present

## 2022-07-23 DIAGNOSIS — M6259 Muscle wasting and atrophy, not elsewhere classified, multiple sites: Secondary | ICD-10-CM | POA: Diagnosis not present

## 2022-07-23 DIAGNOSIS — M24542 Contracture, left hand: Secondary | ICD-10-CM | POA: Diagnosis not present

## 2022-07-23 DIAGNOSIS — M24531 Contracture, right wrist: Secondary | ICD-10-CM | POA: Diagnosis not present

## 2022-07-23 DIAGNOSIS — E559 Vitamin D deficiency, unspecified: Secondary | ICD-10-CM | POA: Diagnosis not present

## 2022-07-23 DIAGNOSIS — D509 Iron deficiency anemia, unspecified: Secondary | ICD-10-CM | POA: Diagnosis not present

## 2022-07-23 DIAGNOSIS — R2681 Unsteadiness on feet: Secondary | ICD-10-CM | POA: Diagnosis not present

## 2022-07-23 DIAGNOSIS — I6350 Cerebral infarction due to unspecified occlusion or stenosis of unspecified cerebral artery: Secondary | ICD-10-CM | POA: Diagnosis not present

## 2022-07-23 DIAGNOSIS — I69354 Hemiplegia and hemiparesis following cerebral infarction affecting left non-dominant side: Secondary | ICD-10-CM | POA: Diagnosis not present

## 2022-07-24 DIAGNOSIS — L97522 Non-pressure chronic ulcer of other part of left foot with fat layer exposed: Secondary | ICD-10-CM | POA: Diagnosis not present

## 2022-07-24 DIAGNOSIS — M24531 Contracture, right wrist: Secondary | ICD-10-CM | POA: Diagnosis not present

## 2022-07-24 DIAGNOSIS — I6350 Cerebral infarction due to unspecified occlusion or stenosis of unspecified cerebral artery: Secondary | ICD-10-CM | POA: Diagnosis not present

## 2022-07-24 DIAGNOSIS — I69359 Hemiplegia and hemiparesis following cerebral infarction affecting unspecified side: Secondary | ICD-10-CM | POA: Diagnosis not present

## 2022-07-24 DIAGNOSIS — M6259 Muscle wasting and atrophy, not elsewhere classified, multiple sites: Secondary | ICD-10-CM | POA: Diagnosis not present

## 2022-07-24 DIAGNOSIS — M24542 Contracture, left hand: Secondary | ICD-10-CM | POA: Diagnosis not present

## 2022-07-24 DIAGNOSIS — E11621 Type 2 diabetes mellitus with foot ulcer: Secondary | ICD-10-CM | POA: Diagnosis not present

## 2022-07-24 DIAGNOSIS — I69354 Hemiplegia and hemiparesis following cerebral infarction affecting left non-dominant side: Secondary | ICD-10-CM | POA: Diagnosis not present

## 2022-07-24 DIAGNOSIS — R293 Abnormal posture: Secondary | ICD-10-CM | POA: Diagnosis not present

## 2022-07-24 DIAGNOSIS — R2681 Unsteadiness on feet: Secondary | ICD-10-CM | POA: Diagnosis not present

## 2022-07-24 DIAGNOSIS — F0390 Unspecified dementia without behavioral disturbance: Secondary | ICD-10-CM | POA: Diagnosis not present

## 2022-07-25 DIAGNOSIS — I69354 Hemiplegia and hemiparesis following cerebral infarction affecting left non-dominant side: Secondary | ICD-10-CM | POA: Diagnosis not present

## 2022-07-25 DIAGNOSIS — M24542 Contracture, left hand: Secondary | ICD-10-CM | POA: Diagnosis not present

## 2022-07-25 DIAGNOSIS — M6259 Muscle wasting and atrophy, not elsewhere classified, multiple sites: Secondary | ICD-10-CM | POA: Diagnosis not present

## 2022-07-25 DIAGNOSIS — I6350 Cerebral infarction due to unspecified occlusion or stenosis of unspecified cerebral artery: Secondary | ICD-10-CM | POA: Diagnosis not present

## 2022-07-25 DIAGNOSIS — R293 Abnormal posture: Secondary | ICD-10-CM | POA: Diagnosis not present

## 2022-07-25 DIAGNOSIS — M24531 Contracture, right wrist: Secondary | ICD-10-CM | POA: Diagnosis not present

## 2022-07-25 DIAGNOSIS — R2681 Unsteadiness on feet: Secondary | ICD-10-CM | POA: Diagnosis not present

## 2022-07-26 DIAGNOSIS — I69354 Hemiplegia and hemiparesis following cerebral infarction affecting left non-dominant side: Secondary | ICD-10-CM | POA: Diagnosis not present

## 2022-07-26 DIAGNOSIS — M6259 Muscle wasting and atrophy, not elsewhere classified, multiple sites: Secondary | ICD-10-CM | POA: Diagnosis not present

## 2022-07-26 DIAGNOSIS — I6350 Cerebral infarction due to unspecified occlusion or stenosis of unspecified cerebral artery: Secondary | ICD-10-CM | POA: Diagnosis not present

## 2022-07-26 DIAGNOSIS — R2681 Unsteadiness on feet: Secondary | ICD-10-CM | POA: Diagnosis not present

## 2022-07-26 DIAGNOSIS — R293 Abnormal posture: Secondary | ICD-10-CM | POA: Diagnosis not present

## 2022-07-26 DIAGNOSIS — M24542 Contracture, left hand: Secondary | ICD-10-CM | POA: Diagnosis not present

## 2022-07-26 DIAGNOSIS — M24531 Contracture, right wrist: Secondary | ICD-10-CM | POA: Diagnosis not present

## 2022-07-28 DIAGNOSIS — M24531 Contracture, right wrist: Secondary | ICD-10-CM | POA: Diagnosis not present

## 2022-07-28 DIAGNOSIS — I6350 Cerebral infarction due to unspecified occlusion or stenosis of unspecified cerebral artery: Secondary | ICD-10-CM | POA: Diagnosis not present

## 2022-07-28 DIAGNOSIS — R2681 Unsteadiness on feet: Secondary | ICD-10-CM | POA: Diagnosis not present

## 2022-07-28 DIAGNOSIS — R293 Abnormal posture: Secondary | ICD-10-CM | POA: Diagnosis not present

## 2022-07-28 DIAGNOSIS — M24542 Contracture, left hand: Secondary | ICD-10-CM | POA: Diagnosis not present

## 2022-07-28 DIAGNOSIS — M6259 Muscle wasting and atrophy, not elsewhere classified, multiple sites: Secondary | ICD-10-CM | POA: Diagnosis not present

## 2022-07-28 DIAGNOSIS — E11621 Type 2 diabetes mellitus with foot ulcer: Secondary | ICD-10-CM | POA: Diagnosis not present

## 2022-07-28 DIAGNOSIS — I69354 Hemiplegia and hemiparesis following cerebral infarction affecting left non-dominant side: Secondary | ICD-10-CM | POA: Diagnosis not present

## 2022-07-29 DIAGNOSIS — M6259 Muscle wasting and atrophy, not elsewhere classified, multiple sites: Secondary | ICD-10-CM | POA: Diagnosis not present

## 2022-07-29 DIAGNOSIS — I69354 Hemiplegia and hemiparesis following cerebral infarction affecting left non-dominant side: Secondary | ICD-10-CM | POA: Diagnosis not present

## 2022-07-29 DIAGNOSIS — M24531 Contracture, right wrist: Secondary | ICD-10-CM | POA: Diagnosis not present

## 2022-07-29 DIAGNOSIS — M24542 Contracture, left hand: Secondary | ICD-10-CM | POA: Diagnosis not present

## 2022-07-29 DIAGNOSIS — R293 Abnormal posture: Secondary | ICD-10-CM | POA: Diagnosis not present

## 2022-07-29 DIAGNOSIS — I6350 Cerebral infarction due to unspecified occlusion or stenosis of unspecified cerebral artery: Secondary | ICD-10-CM | POA: Diagnosis not present

## 2022-07-29 DIAGNOSIS — R2681 Unsteadiness on feet: Secondary | ICD-10-CM | POA: Diagnosis not present

## 2022-07-30 DIAGNOSIS — I69354 Hemiplegia and hemiparesis following cerebral infarction affecting left non-dominant side: Secondary | ICD-10-CM | POA: Diagnosis not present

## 2022-07-30 DIAGNOSIS — R2681 Unsteadiness on feet: Secondary | ICD-10-CM | POA: Diagnosis not present

## 2022-07-30 DIAGNOSIS — I6350 Cerebral infarction due to unspecified occlusion or stenosis of unspecified cerebral artery: Secondary | ICD-10-CM | POA: Diagnosis not present

## 2022-07-30 DIAGNOSIS — R293 Abnormal posture: Secondary | ICD-10-CM | POA: Diagnosis not present

## 2022-07-30 DIAGNOSIS — M24542 Contracture, left hand: Secondary | ICD-10-CM | POA: Diagnosis not present

## 2022-07-30 DIAGNOSIS — M6259 Muscle wasting and atrophy, not elsewhere classified, multiple sites: Secondary | ICD-10-CM | POA: Diagnosis not present

## 2022-07-30 DIAGNOSIS — M24531 Contracture, right wrist: Secondary | ICD-10-CM | POA: Diagnosis not present

## 2022-07-31 DIAGNOSIS — M24542 Contracture, left hand: Secondary | ICD-10-CM | POA: Diagnosis not present

## 2022-07-31 DIAGNOSIS — I6350 Cerebral infarction due to unspecified occlusion or stenosis of unspecified cerebral artery: Secondary | ICD-10-CM | POA: Diagnosis not present

## 2022-07-31 DIAGNOSIS — I69359 Hemiplegia and hemiparesis following cerebral infarction affecting unspecified side: Secondary | ICD-10-CM | POA: Diagnosis not present

## 2022-07-31 DIAGNOSIS — L97524 Non-pressure chronic ulcer of other part of left foot with necrosis of bone: Secondary | ICD-10-CM | POA: Diagnosis not present

## 2022-07-31 DIAGNOSIS — R293 Abnormal posture: Secondary | ICD-10-CM | POA: Diagnosis not present

## 2022-07-31 DIAGNOSIS — L97521 Non-pressure chronic ulcer of other part of left foot limited to breakdown of skin: Secondary | ICD-10-CM | POA: Diagnosis not present

## 2022-07-31 DIAGNOSIS — I69354 Hemiplegia and hemiparesis following cerebral infarction affecting left non-dominant side: Secondary | ICD-10-CM | POA: Diagnosis not present

## 2022-07-31 DIAGNOSIS — E11621 Type 2 diabetes mellitus with foot ulcer: Secondary | ICD-10-CM | POA: Diagnosis not present

## 2022-07-31 DIAGNOSIS — I70245 Atherosclerosis of native arteries of left leg with ulceration of other part of foot: Secondary | ICD-10-CM | POA: Diagnosis not present

## 2022-07-31 DIAGNOSIS — M24531 Contracture, right wrist: Secondary | ICD-10-CM | POA: Diagnosis not present

## 2022-07-31 DIAGNOSIS — M6259 Muscle wasting and atrophy, not elsewhere classified, multiple sites: Secondary | ICD-10-CM | POA: Diagnosis not present

## 2022-07-31 DIAGNOSIS — R2681 Unsteadiness on feet: Secondary | ICD-10-CM | POA: Diagnosis not present

## 2022-08-01 DIAGNOSIS — R2681 Unsteadiness on feet: Secondary | ICD-10-CM | POA: Diagnosis not present

## 2022-08-01 DIAGNOSIS — M24531 Contracture, right wrist: Secondary | ICD-10-CM | POA: Diagnosis not present

## 2022-08-01 DIAGNOSIS — I69354 Hemiplegia and hemiparesis following cerebral infarction affecting left non-dominant side: Secondary | ICD-10-CM | POA: Diagnosis not present

## 2022-08-01 DIAGNOSIS — I6350 Cerebral infarction due to unspecified occlusion or stenosis of unspecified cerebral artery: Secondary | ICD-10-CM | POA: Diagnosis not present

## 2022-08-01 DIAGNOSIS — M6259 Muscle wasting and atrophy, not elsewhere classified, multiple sites: Secondary | ICD-10-CM | POA: Diagnosis not present

## 2022-08-01 DIAGNOSIS — R293 Abnormal posture: Secondary | ICD-10-CM | POA: Diagnosis not present

## 2022-08-01 DIAGNOSIS — M24542 Contracture, left hand: Secondary | ICD-10-CM | POA: Diagnosis not present

## 2022-08-04 DIAGNOSIS — I69354 Hemiplegia and hemiparesis following cerebral infarction affecting left non-dominant side: Secondary | ICD-10-CM | POA: Diagnosis not present

## 2022-08-04 DIAGNOSIS — M24531 Contracture, right wrist: Secondary | ICD-10-CM | POA: Diagnosis not present

## 2022-08-04 DIAGNOSIS — R293 Abnormal posture: Secondary | ICD-10-CM | POA: Diagnosis not present

## 2022-08-04 DIAGNOSIS — M24542 Contracture, left hand: Secondary | ICD-10-CM | POA: Diagnosis not present

## 2022-08-04 DIAGNOSIS — M6259 Muscle wasting and atrophy, not elsewhere classified, multiple sites: Secondary | ICD-10-CM | POA: Diagnosis not present

## 2022-08-04 DIAGNOSIS — R2681 Unsteadiness on feet: Secondary | ICD-10-CM | POA: Diagnosis not present

## 2022-08-04 DIAGNOSIS — I6350 Cerebral infarction due to unspecified occlusion or stenosis of unspecified cerebral artery: Secondary | ICD-10-CM | POA: Diagnosis not present

## 2022-08-05 DIAGNOSIS — M6259 Muscle wasting and atrophy, not elsewhere classified, multiple sites: Secondary | ICD-10-CM | POA: Diagnosis not present

## 2022-08-05 DIAGNOSIS — M24542 Contracture, left hand: Secondary | ICD-10-CM | POA: Diagnosis not present

## 2022-08-05 DIAGNOSIS — R293 Abnormal posture: Secondary | ICD-10-CM | POA: Diagnosis not present

## 2022-08-05 DIAGNOSIS — I69354 Hemiplegia and hemiparesis following cerebral infarction affecting left non-dominant side: Secondary | ICD-10-CM | POA: Diagnosis not present

## 2022-08-05 DIAGNOSIS — M24531 Contracture, right wrist: Secondary | ICD-10-CM | POA: Diagnosis not present

## 2022-08-05 DIAGNOSIS — R2681 Unsteadiness on feet: Secondary | ICD-10-CM | POA: Diagnosis not present

## 2022-08-05 DIAGNOSIS — I6350 Cerebral infarction due to unspecified occlusion or stenosis of unspecified cerebral artery: Secondary | ICD-10-CM | POA: Diagnosis not present

## 2022-08-06 DIAGNOSIS — R2681 Unsteadiness on feet: Secondary | ICD-10-CM | POA: Diagnosis not present

## 2022-08-06 DIAGNOSIS — M6259 Muscle wasting and atrophy, not elsewhere classified, multiple sites: Secondary | ICD-10-CM | POA: Diagnosis not present

## 2022-08-06 DIAGNOSIS — I6350 Cerebral infarction due to unspecified occlusion or stenosis of unspecified cerebral artery: Secondary | ICD-10-CM | POA: Diagnosis not present

## 2022-08-06 DIAGNOSIS — I69354 Hemiplegia and hemiparesis following cerebral infarction affecting left non-dominant side: Secondary | ICD-10-CM | POA: Diagnosis not present

## 2022-08-06 DIAGNOSIS — M24531 Contracture, right wrist: Secondary | ICD-10-CM | POA: Diagnosis not present

## 2022-08-06 DIAGNOSIS — M24542 Contracture, left hand: Secondary | ICD-10-CM | POA: Diagnosis not present

## 2022-08-06 DIAGNOSIS — R293 Abnormal posture: Secondary | ICD-10-CM | POA: Diagnosis not present

## 2022-08-07 ENCOUNTER — Inpatient Hospital Stay: Payer: Medicare HMO

## 2022-08-07 ENCOUNTER — Inpatient Hospital Stay
Admission: EM | Admit: 2022-08-07 | Discharge: 2022-08-22 | DRG: 240 | Disposition: A | Discharge status: Skilled Nursing Facility | Payer: Medicare HMO | Source: Skilled Nursing Facility | Attending: Internal Medicine | Admitting: Internal Medicine

## 2022-08-07 ENCOUNTER — Emergency Department: Payer: Medicare HMO

## 2022-08-07 DIAGNOSIS — N8110 Cystocele, unspecified: Secondary | ICD-10-CM | POA: Diagnosis present

## 2022-08-07 DIAGNOSIS — S91302A Unspecified open wound, left foot, initial encounter: Secondary | ICD-10-CM | POA: Diagnosis not present

## 2022-08-07 DIAGNOSIS — E1110 Type 2 diabetes mellitus with ketoacidosis without coma: Secondary | ICD-10-CM | POA: Diagnosis not present

## 2022-08-07 DIAGNOSIS — E876 Hypokalemia: Secondary | ICD-10-CM | POA: Diagnosis present

## 2022-08-07 DIAGNOSIS — D62 Acute posthemorrhagic anemia: Secondary | ICD-10-CM | POA: Diagnosis not present

## 2022-08-07 DIAGNOSIS — Z7902 Long term (current) use of antithrombotics/antiplatelets: Secondary | ICD-10-CM

## 2022-08-07 DIAGNOSIS — Z741 Need for assistance with personal care: Secondary | ICD-10-CM | POA: Diagnosis not present

## 2022-08-07 DIAGNOSIS — E1152 Type 2 diabetes mellitus with diabetic peripheral angiopathy with gangrene: Principal | ICD-10-CM | POA: Diagnosis present

## 2022-08-07 DIAGNOSIS — E1142 Type 2 diabetes mellitus with diabetic polyneuropathy: Secondary | ICD-10-CM | POA: Diagnosis present

## 2022-08-07 DIAGNOSIS — I69952 Hemiplegia and hemiparesis following unspecified cerebrovascular disease affecting left dominant side: Secondary | ICD-10-CM

## 2022-08-07 DIAGNOSIS — M86172 Other acute osteomyelitis, left ankle and foot: Secondary | ICD-10-CM | POA: Diagnosis not present

## 2022-08-07 DIAGNOSIS — R293 Abnormal posture: Secondary | ICD-10-CM | POA: Diagnosis not present

## 2022-08-07 DIAGNOSIS — E872 Acidosis, unspecified: Secondary | ICD-10-CM | POA: Diagnosis not present

## 2022-08-07 DIAGNOSIS — G8194 Hemiplegia, unspecified affecting left nondominant side: Secondary | ICD-10-CM | POA: Diagnosis present

## 2022-08-07 DIAGNOSIS — I70222 Atherosclerosis of native arteries of extremities with rest pain, left leg: Secondary | ICD-10-CM | POA: Diagnosis present

## 2022-08-07 DIAGNOSIS — Z66 Do not resuscitate: Secondary | ICD-10-CM | POA: Diagnosis not present

## 2022-08-07 DIAGNOSIS — M7732 Calcaneal spur, left foot: Secondary | ICD-10-CM | POA: Diagnosis not present

## 2022-08-07 DIAGNOSIS — E11621 Type 2 diabetes mellitus with foot ulcer: Secondary | ICD-10-CM | POA: Diagnosis present

## 2022-08-07 DIAGNOSIS — M869 Osteomyelitis, unspecified: Secondary | ICD-10-CM | POA: Diagnosis not present

## 2022-08-07 DIAGNOSIS — L97529 Non-pressure chronic ulcer of other part of left foot with unspecified severity: Secondary | ICD-10-CM | POA: Diagnosis not present

## 2022-08-07 DIAGNOSIS — Z96653 Presence of artificial knee joint, bilateral: Secondary | ICD-10-CM | POA: Diagnosis present

## 2022-08-07 DIAGNOSIS — F0390 Unspecified dementia without behavioral disturbance: Secondary | ICD-10-CM | POA: Diagnosis not present

## 2022-08-07 DIAGNOSIS — G8191 Hemiplegia, unspecified affecting right dominant side: Secondary | ICD-10-CM

## 2022-08-07 DIAGNOSIS — M24542 Contracture, left hand: Secondary | ICD-10-CM | POA: Diagnosis not present

## 2022-08-07 DIAGNOSIS — L03116 Cellulitis of left lower limb: Secondary | ICD-10-CM | POA: Diagnosis present

## 2022-08-07 DIAGNOSIS — Z7189 Other specified counseling: Secondary | ICD-10-CM | POA: Diagnosis not present

## 2022-08-07 DIAGNOSIS — I70268 Atherosclerosis of native arteries of extremities with gangrene, other extremity: Secondary | ICD-10-CM | POA: Diagnosis not present

## 2022-08-07 DIAGNOSIS — L97524 Non-pressure chronic ulcer of other part of left foot with necrosis of bone: Secondary | ICD-10-CM | POA: Diagnosis not present

## 2022-08-07 DIAGNOSIS — I739 Peripheral vascular disease, unspecified: Secondary | ICD-10-CM | POA: Diagnosis not present

## 2022-08-07 DIAGNOSIS — E1129 Type 2 diabetes mellitus with other diabetic kidney complication: Secondary | ICD-10-CM | POA: Diagnosis not present

## 2022-08-07 DIAGNOSIS — E11628 Type 2 diabetes mellitus with other skin complications: Secondary | ICD-10-CM | POA: Diagnosis present

## 2022-08-07 DIAGNOSIS — M24522 Contracture, left elbow: Secondary | ICD-10-CM | POA: Diagnosis not present

## 2022-08-07 DIAGNOSIS — Z794 Long term (current) use of insulin: Secondary | ICD-10-CM | POA: Diagnosis not present

## 2022-08-07 DIAGNOSIS — E782 Mixed hyperlipidemia: Secondary | ICD-10-CM | POA: Diagnosis present

## 2022-08-07 DIAGNOSIS — Z01818 Encounter for other preprocedural examination: Secondary | ICD-10-CM | POA: Diagnosis not present

## 2022-08-07 DIAGNOSIS — I96 Gangrene, not elsewhere classified: Secondary | ICD-10-CM | POA: Diagnosis not present

## 2022-08-07 DIAGNOSIS — I70201 Unspecified atherosclerosis of native arteries of extremities, right leg: Secondary | ICD-10-CM | POA: Diagnosis present

## 2022-08-07 DIAGNOSIS — E440 Moderate protein-calorie malnutrition: Secondary | ICD-10-CM | POA: Diagnosis present

## 2022-08-07 DIAGNOSIS — Z79899 Other long term (current) drug therapy: Secondary | ICD-10-CM

## 2022-08-07 DIAGNOSIS — Z95820 Peripheral vascular angioplasty status with implants and grafts: Secondary | ICD-10-CM

## 2022-08-07 DIAGNOSIS — K5900 Constipation, unspecified: Secondary | ICD-10-CM | POA: Diagnosis present

## 2022-08-07 DIAGNOSIS — R2681 Unsteadiness on feet: Secondary | ICD-10-CM | POA: Diagnosis not present

## 2022-08-07 DIAGNOSIS — L89626 Pressure-induced deep tissue damage of left heel: Secondary | ICD-10-CM | POA: Diagnosis present

## 2022-08-07 DIAGNOSIS — L89609 Pressure ulcer of unspecified heel, unspecified stage: Secondary | ICD-10-CM | POA: Diagnosis present

## 2022-08-07 DIAGNOSIS — I69354 Hemiplegia and hemiparesis following cerebral infarction affecting left non-dominant side: Secondary | ICD-10-CM

## 2022-08-07 DIAGNOSIS — Z515 Encounter for palliative care: Secondary | ICD-10-CM | POA: Diagnosis not present

## 2022-08-07 DIAGNOSIS — I69359 Hemiplegia and hemiparesis following cerebral infarction affecting unspecified side: Secondary | ICD-10-CM | POA: Diagnosis not present

## 2022-08-07 DIAGNOSIS — M1990 Unspecified osteoarthritis, unspecified site: Secondary | ICD-10-CM | POA: Diagnosis present

## 2022-08-07 DIAGNOSIS — I6350 Cerebral infarction due to unspecified occlusion or stenosis of unspecified cerebral artery: Secondary | ICD-10-CM | POA: Diagnosis not present

## 2022-08-07 DIAGNOSIS — E1169 Type 2 diabetes mellitus with other specified complication: Secondary | ICD-10-CM | POA: Diagnosis not present

## 2022-08-07 DIAGNOSIS — Z888 Allergy status to other drugs, medicaments and biological substances status: Secondary | ICD-10-CM

## 2022-08-07 DIAGNOSIS — M8580 Other specified disorders of bone density and structure, unspecified site: Secondary | ICD-10-CM | POA: Diagnosis not present

## 2022-08-07 DIAGNOSIS — Z6824 Body mass index (BMI) 24.0-24.9, adult: Secondary | ICD-10-CM

## 2022-08-07 DIAGNOSIS — E559 Vitamin D deficiency, unspecified: Secondary | ICD-10-CM | POA: Diagnosis present

## 2022-08-07 DIAGNOSIS — N179 Acute kidney failure, unspecified: Secondary | ICD-10-CM | POA: Diagnosis not present

## 2022-08-07 DIAGNOSIS — M86672 Other chronic osteomyelitis, left ankle and foot: Secondary | ICD-10-CM | POA: Diagnosis not present

## 2022-08-07 DIAGNOSIS — Z803 Family history of malignant neoplasm of breast: Secondary | ICD-10-CM

## 2022-08-07 DIAGNOSIS — Z7982 Long term (current) use of aspirin: Secondary | ICD-10-CM

## 2022-08-07 DIAGNOSIS — I10 Essential (primary) hypertension: Secondary | ICD-10-CM | POA: Diagnosis not present

## 2022-08-07 DIAGNOSIS — L97426 Non-pressure chronic ulcer of left heel and midfoot with bone involvement without evidence of necrosis: Secondary | ICD-10-CM | POA: Diagnosis present

## 2022-08-07 DIAGNOSIS — Z7984 Long term (current) use of oral hypoglycemic drugs: Secondary | ICD-10-CM | POA: Diagnosis not present

## 2022-08-07 DIAGNOSIS — I700 Atherosclerosis of aorta: Secondary | ICD-10-CM | POA: Diagnosis not present

## 2022-08-07 DIAGNOSIS — M24531 Contracture, right wrist: Secondary | ICD-10-CM | POA: Diagnosis not present

## 2022-08-07 DIAGNOSIS — D519 Vitamin B12 deficiency anemia, unspecified: Secondary | ICD-10-CM | POA: Diagnosis present

## 2022-08-07 DIAGNOSIS — R278 Other lack of coordination: Secondary | ICD-10-CM | POA: Diagnosis not present

## 2022-08-07 DIAGNOSIS — L089 Local infection of the skin and subcutaneous tissue, unspecified: Secondary | ICD-10-CM | POA: Diagnosis not present

## 2022-08-07 DIAGNOSIS — M6259 Muscle wasting and atrophy, not elsewhere classified, multiple sites: Secondary | ICD-10-CM | POA: Diagnosis not present

## 2022-08-07 DIAGNOSIS — M24532 Contracture, left wrist: Secondary | ICD-10-CM | POA: Diagnosis not present

## 2022-08-07 DIAGNOSIS — R001 Bradycardia, unspecified: Secondary | ICD-10-CM | POA: Diagnosis not present

## 2022-08-07 DIAGNOSIS — M85872 Other specified disorders of bone density and structure, left ankle and foot: Secondary | ICD-10-CM | POA: Diagnosis not present

## 2022-08-07 DIAGNOSIS — D509 Iron deficiency anemia, unspecified: Secondary | ICD-10-CM | POA: Diagnosis present

## 2022-08-07 DIAGNOSIS — M7989 Other specified soft tissue disorders: Secondary | ICD-10-CM | POA: Diagnosis not present

## 2022-08-07 DIAGNOSIS — I70245 Atherosclerosis of native arteries of left leg with ulceration of other part of foot: Secondary | ICD-10-CM | POA: Diagnosis not present

## 2022-08-07 LAB — COMPREHENSIVE METABOLIC PANEL
ALT: 15 U/L (ref 0–44)
AST: 17 U/L (ref 15–41)
Albumin: 3.2 g/dL — ABNORMAL LOW (ref 3.5–5.0)
Alkaline Phosphatase: 105 U/L (ref 38–126)
Anion gap: 12 (ref 5–15)
BUN: 35 mg/dL — ABNORMAL HIGH (ref 8–23)
CO2: 27 mmol/L (ref 22–32)
Calcium: 9.1 mg/dL (ref 8.9–10.3)
Chloride: 97 mmol/L — ABNORMAL LOW (ref 98–111)
Creatinine, Ser: 0.58 mg/dL (ref 0.44–1.00)
GFR, Estimated: >60 mL/min (ref >60)
Glucose, Bld: 344 mg/dL — ABNORMAL HIGH (ref 70–99)
Potassium: 4.2 mmol/L (ref 3.5–5.1)
Sodium: 136 mmol/L (ref 135–145)
Total Bilirubin: 0.4 mg/dL (ref 0.3–1.2)
Total Protein: 7.1 g/dL (ref 6.5–8.1)

## 2022-08-07 LAB — CBC
HCT: 32.3 % — ABNORMAL LOW (ref 36.0–46.0)
Hemoglobin: 10 g/dL — ABNORMAL LOW (ref 12.0–15.0)
MCH: 30.3 pg (ref 26.0–34.0)
MCHC: 31 g/dL (ref 30.0–36.0)
MCV: 97.9 fL (ref 80.0–100.0)
Platelets: 345 10*3/uL (ref 150–400)
RBC: 3.3 MIL/uL — ABNORMAL LOW (ref 3.87–5.11)
RDW: 12.8 % (ref 11.5–15.5)
WBC: 11.1 10*3/uL — ABNORMAL HIGH (ref 4.0–10.5)
nRBC: 0 % (ref 0.0–0.2)

## 2022-08-07 LAB — GLUCOSE, CAPILLARY: Glucose-Capillary: 204 mg/dL — ABNORMAL HIGH (ref 70–99)

## 2022-08-07 LAB — LACTIC ACID, PLASMA: Lactic Acid, Venous: 1.8 mmol/L (ref 0.5–1.9)

## 2022-08-07 MED ORDER — POLYETHYLENE GLYCOL 3350 17 G PO PACK: 17.0000 g | PACK | Freq: Every day | ORAL | Status: DC | PRN

## 2022-08-07 MED ORDER — SODIUM CHLORIDE 0.9 % IV SOLN
2.0000 g | Freq: Once | INTRAVENOUS | Status: AC
  Administered 2022-08-07: 2 g via INTRAVENOUS
  Filled 2022-08-07: qty 20

## 2022-08-07 MED ORDER — FERROUS SULFATE 325 (65 FE) MG PO TABS
325.0000 mg | ORAL_TABLET | ORAL | Status: DC
  Administered 2022-08-08 – 2022-08-22 (×8): 325 mg via ORAL
  Filled 2022-08-07 (×8): qty 1

## 2022-08-07 MED ORDER — PRO-STAT SUGAR FREE PO LIQD
30.0000 mL | Freq: Two times a day (BID) | ORAL | Status: DC
  Administered 2022-08-12 – 2022-08-20 (×12): 30 mL via ORAL

## 2022-08-07 MED ORDER — VANCOMYCIN HCL IN DEXTROSE 1-5 GM/200ML-% IV SOLN
1000.0000 mg | INTRAVENOUS | Status: AC
  Administered 2022-08-08 – 2022-08-12 (×5): 1000 mg via INTRAVENOUS
  Filled 2022-08-07 (×7): qty 200

## 2022-08-07 MED ORDER — VANCOMYCIN HCL 1500 MG/300ML IV SOLN
1500.0000 mg | Freq: Once | INTRAVENOUS | Status: AC
  Administered 2022-08-07: 1500 mg via INTRAVENOUS
  Filled 2022-08-07: qty 300

## 2022-08-07 MED ORDER — ASPIRIN 81 MG PO TBEC
81.0000 mg | DELAYED_RELEASE_TABLET | Freq: Every day | ORAL | Status: DC
  Administered 2022-08-08 – 2022-08-22 (×13): 81 mg via ORAL
  Filled 2022-08-07 (×13): qty 1

## 2022-08-07 MED ORDER — SODIUM CHLORIDE 0.9 % IV BOLUS (SEPSIS)
1000.0000 mL | Freq: Once | INTRAVENOUS | Status: AC
  Administered 2022-08-07: 1000 mL via INTRAVENOUS

## 2022-08-07 MED ORDER — INSULIN ASPART 100 UNIT/ML IJ SOLN
0.0000 [IU] | Freq: Three times a day (TID) | INTRAMUSCULAR | Status: DC
  Administered 2022-08-08: 2 [IU] via SUBCUTANEOUS
  Administered 2022-08-08: 3 [IU] via SUBCUTANEOUS
  Administered 2022-08-09: 5 [IU] via SUBCUTANEOUS
  Administered 2022-08-10: 2 [IU] via SUBCUTANEOUS
  Administered 2022-08-10: 3 [IU] via SUBCUTANEOUS
  Administered 2022-08-11 (×2): 2 [IU] via SUBCUTANEOUS
  Administered 2022-08-12: 5 [IU] via SUBCUTANEOUS
  Administered 2022-08-12 – 2022-08-13 (×2): 3 [IU] via SUBCUTANEOUS
  Administered 2022-08-14: 2 [IU] via SUBCUTANEOUS
  Administered 2022-08-14: 3 [IU] via SUBCUTANEOUS
  Administered 2022-08-15 – 2022-08-16 (×4): 2 [IU] via SUBCUTANEOUS
  Administered 2022-08-16: 3 [IU] via SUBCUTANEOUS
  Administered 2022-08-17: 8 [IU] via SUBCUTANEOUS
  Administered 2022-08-17 – 2022-08-19 (×2): 3 [IU] via SUBCUTANEOUS
  Administered 2022-08-19 – 2022-08-20 (×2): 2 [IU] via SUBCUTANEOUS
  Administered 2022-08-20 – 2022-08-21 (×3): 3 [IU] via SUBCUTANEOUS
  Filled 2022-08-07 (×23): qty 1

## 2022-08-07 MED ORDER — LISINOPRIL 10 MG PO TABS
10.0000 mg | ORAL_TABLET | Freq: Every day | ORAL | Status: DC
  Administered 2022-08-08 – 2022-08-09 (×2): 10 mg via ORAL
  Filled 2022-08-07 (×2): qty 1

## 2022-08-07 MED ORDER — VITAMIN B-12 1000 MCG PO TABS
1000.0000 ug | ORAL_TABLET | Freq: Every day | ORAL | Status: DC
  Administered 2022-08-08 – 2022-08-22 (×13): 1000 ug via ORAL
  Filled 2022-08-07 (×13): qty 1

## 2022-08-07 MED ORDER — CLOPIDOGREL BISULFATE 75 MG PO TABS
75.0000 mg | ORAL_TABLET | Freq: Every day | ORAL | Status: DC
  Administered 2022-08-08 – 2022-08-22 (×13): 75 mg via ORAL
  Filled 2022-08-07 (×13): qty 1

## 2022-08-07 MED ORDER — SODIUM CHLORIDE 0.9% FLUSH
3.0000 mL | Freq: Two times a day (BID) | INTRAVENOUS | Status: DC
  Administered 2022-08-07 – 2022-08-21 (×16): 3 mL via INTRAVENOUS

## 2022-08-07 MED ORDER — HYDROCHLOROTHIAZIDE 12.5 MG PO TABS
12.5000 mg | ORAL_TABLET | Freq: Every day | ORAL | Status: DC
  Administered 2022-08-08 – 2022-08-09 (×2): 12.5 mg via ORAL
  Filled 2022-08-07 (×2): qty 1

## 2022-08-07 MED ORDER — ACETAMINOPHEN 650 MG RE SUPP: 650.0000 mg | Freq: Four times a day (QID) | RECTAL | Status: DC | PRN

## 2022-08-07 MED ORDER — ACETAMINOPHEN 325 MG PO TABS
650.0000 mg | ORAL_TABLET | Freq: Four times a day (QID) | ORAL | Status: DC | PRN
  Administered 2022-08-08 – 2022-08-22 (×11): 650 mg via ORAL
  Filled 2022-08-07 (×11): qty 2

## 2022-08-07 MED ORDER — MAGNESIUM OXIDE -MG SUPPLEMENT 400 (240 MG) MG PO TABS
400.0000 mg | ORAL_TABLET | Freq: Every day | ORAL | Status: DC
  Administered 2022-08-08 – 2022-08-22 (×13): 400 mg via ORAL
  Filled 2022-08-07 (×13): qty 1

## 2022-08-07 MED ORDER — ATORVASTATIN CALCIUM 20 MG PO TABS
80.0000 mg | ORAL_TABLET | Freq: Every day | ORAL | Status: DC
  Administered 2022-08-08 – 2022-08-21 (×13): 80 mg via ORAL
  Filled 2022-08-07 (×13): qty 4

## 2022-08-07 MED ORDER — PIPERACILLIN-TAZOBACTAM 3.375 G IVPB
3.3750 g | Freq: Three times a day (TID) | INTRAVENOUS | Status: AC
  Administered 2022-08-07 – 2022-08-13 (×18): 3.375 g via INTRAVENOUS
  Filled 2022-08-07 (×16): qty 50

## 2022-08-07 MED ORDER — IOHEXOL 300 MG/ML  SOLN
100.0000 mL | Freq: Once | INTRAMUSCULAR | Status: AC | PRN
  Administered 2022-08-07: 100 mL via INTRAVENOUS

## 2022-08-07 MED ORDER — VITAMIN D (ERGOCALCIFEROL) 1.25 MG (50000 UNIT) PO CAPS
50000.0000 [IU] | ORAL_CAPSULE | ORAL | Status: DC
  Administered 2022-08-14 – 2022-08-21 (×2): 50000 [IU] via ORAL
  Filled 2022-08-07 (×2): qty 1

## 2022-08-07 MED ORDER — ENOXAPARIN SODIUM 40 MG/0.4ML IJ SOSY
40.0000 mg | PREFILLED_SYRINGE | INTRAMUSCULAR | Status: DC
  Administered 2022-08-08 – 2022-08-14 (×5): 40 mg via SUBCUTANEOUS
  Filled 2022-08-07 (×5): qty 0.4

## 2022-08-07 MED ORDER — SODIUM CHLORIDE 0.9 % IV SOLN: INTRAVENOUS | Status: DC

## 2022-08-07 MED ORDER — VANCOMYCIN HCL IN DEXTROSE 1-5 GM/200ML-% IV SOLN: 1000.0000 mg | Freq: Once | INTRAVENOUS | Status: DC

## 2022-08-07 MED ORDER — INSULIN ASPART 100 UNIT/ML IJ SOLN
0.0000 [IU] | Freq: Every day | INTRAMUSCULAR | Status: DC
  Administered 2022-08-07: 2 [IU] via SUBCUTANEOUS
  Filled 2022-08-07: qty 1

## 2022-08-07 NOTE — ED Triage Notes (Signed)
Pt arrived via EMS from [facility] due to a wound on the pt left foot. Pt foot is warm and swollen. Per EMS pt wound care doctor wanted her to be seen here today. EMS sts that facility sts that pt foot has been pulseless for the last week. Pt is able to move toes and pt foot is warm.

## 2022-08-07 NOTE — Assessment & Plan Note (Signed)
Turn in position, offload heel and pressure ulceration

## 2022-08-07 NOTE — Consult Note (Signed)
PHARMACY -  BRIEF ANTIBIOTIC NOTE   Pharmacy has received consult(s) for vancomycin from an ED provider.  The patient's profile has been reviewed for ht/wt/allergies/indication/available labs.    One time order(s) placed for vancomycin 1250 mg   Further antibiotics/pharmacy consults should be ordered by admitting physician if indicated.                       Thank you,  Celene Squibb, PharmD Clinical Pharmacist 08/07/2022 5:02 PM

## 2022-08-07 NOTE — Assessment & Plan Note (Signed)
Continue with atorvastatin 

## 2022-08-07 NOTE — Assessment & Plan Note (Signed)
Continue with nutritional supplementation.  ?

## 2022-08-07 NOTE — ED Provider Notes (Signed)
[facility] Provider Note    Event Date/Time   First MD Initiated Contact with Patient 08/07/22 1636     (approximate)   History   Chief Complaint: Wound Check   HPI  [name] is a 89 y.o. female with a history of hypertension, diabetes, prior stroke with left-sided hemiparesis who was sent to the ED from SNF due to wound on the left foot.  Patient has dementia and does not report any specific symptoms.  Reportedly patient was evaluated by podiatry today and sent to the ED for evaluation of foot infection.  She is followed by D.P.M. Hilma Favors.        Physical Exam   Triage Vital Signs: ED Triage Vitals  Enc Vitals Group     BP 08/07/22 1602 (!) 140/78     Pulse Rate 08/07/22 1602 82     Resp 08/07/22 1602 17     Temp 08/07/22 1602 97.8 F (36.6 C)     Temp Source 08/07/22 1602 Oral     SpO2 08/07/22 1602 99 %     Weight 08/07/22 1603 149 lb 14.6 oz (68 kg)     Height 08/07/22 1603 5\' 6"  (1.676 m)     Head Circumference --      [place] Flow --      Pain Score 08/07/22 1603 0     Pain Loc --      Pain Edu? --      Excl. in GC? --     Most recent vital signs: Vitals:   08/07/22 1602  BP: (!) 140/78  Pulse: 82  Resp: 17  Temp: 97.8 F (36.6 C)  SpO2: 99%    General: Awake, no distress.  CV:    Regular rate rhythm.  No palpable left dorsalis pedis pulse, but with Doppler there is biphasic pulse.  Left foot is warm but the distal great toe is purple with necrosis of the tip.  Right foot appears normal with a thready palpable DP pulse. Resp:  Normal effort.  Clear to auscultation bilaterally Abd:  No distention.  Soft nontender Other:  Erythema edema and tenderness of the left foot diffusely.  There is an open wound with fibrinous material and scant purulent exudate over the lateral left foot at the base of the fifth metatarsal, probes to bone concerning for osteomyelitis.   ED Results / Procedures / Treatments   Labs (all  labs ordered are listed, but only abnormal results are displayed) Labs Reviewed  CBC - Abnormal; Notable for the following components:      Result Value   WBC 11.1 (*)    RBC 3.30 (*)    Hemoglobin 10.0 (*)    HCT 32.3 (*)    All other components within normal limits  COMPREHENSIVE METABOLIC PANEL - Abnormal; Notable for the following components:   Chloride 97 (*)    Glucose, Bld 344 (*)    BUN 35 (*)    Albumin 3.2 (*)    All other components within normal limits  CULTURE, BLOOD (SINGLE)  LACTIC ACID, PLASMA     EKG    RADIOLOGY X-ray left foot interpreted by me, shows wound extending to the bone of the fifth metatarsal with erosion consistent with osteomyelitis.  Radiology report reviewed.   PROCEDURES:  Procedures   MEDICATIONS ORDERED IN ED: Medications  sodium chloride 0.9 % bolus 1,000 mL (has no administration in time range)  cefTRIAXone (ROCEPHIN) 2 g in sodium chloride  0.9 % 100 mL IVPB (has no administration in time range)  vancomycin (VANCOREADY) IVPB 1500 mg/300 mL (has no administration in time range)     IMPRESSION / MDM / ASSESSMENT AND PLAN / ED COURSE  I reviewed the triage vital signs and the nursing notes.  DDx: Diabetic foot infection, osteomyelitis, dehydration, electrolyte abnormality, hyperglycemia  Patient's presentation is most consistent with acute presentation with potential threat to life or bodily function.     Clinical Course as of 08/07/22 1749  Thu Aug 07, 2022  1658 Pt has +doppler DP pulse but some great toe tip necrosis. Deep wound on the heel concerning for osteo. Will xray, start abx [PS]    Clinical Course User Index [PS] Sharman Cheek, MD    ----------------------------------------- 6:40 PM on 08/07/2022 ----------------------------------------- X-ray compatible with osteomyelitis.  Vital signs and serum labs are unremarkable except for hyperglycemia.  No acidosis.  Case discussed with hospitalist for further  management.   FINAL CLINICAL IMPRESSION(S) / ED DIAGNOSES   Final diagnoses:  Diabetic foot infection (HCC)  Osteomyelitis of left foot, unspecified type (HCC)     Rx / DC Orders   ED Discharge Orders     None        Note:  This document was prepared using Dragon voice recognition software and may include unintentional dictation errors.   Sharman Cheek, MD 08/07/22 1840
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
## 2022-08-07 NOTE — Consult Note (Addendum)
Pharmacy Antibiotic Note  ASSESSMENT: 89 y.o. female with PMH dementia, HTN, DM, CVA is presenting with  wound infection on left foot . Pharmacy has been consulted to manage Zosyn and vancomycin dosing.  Patient measurements: Height: 5\' 6"  (167.6 cm) Weight: 68 kg (149 lb 14.6 oz) IBW/kg (Calculated) : 59.3  Vital signs: Temp: 97.8 F (36.6 C) (06/27 1602) Temp Source: Oral (06/27 1602) BP: 146/71 (06/27 1900) Pulse Rate: 82 (06/27 1602) Recent Labs  Lab 08/07/22 1605  WBC 11.1*  CREATININE 0.58   Estimated Creatinine Clearance: 47.3 mL/min (by C-G formula based on SCr of 0.58 mg/dL).  Allergies: Allergies  Allergen Reactions   Metformin And Related Nausea And Vomiting    Antimicrobials this admission: Ceftriaxone 6/27 x 1 Zosyn 6/27 >> Vancomycin 6/27 >>  Dose adjustments this admission: N/A  Microbiology results: 6/27 BCx: sent  Vancomycin 1000 mg IV q24H Goal AUC 400-550  Est AUC: 468; Cmax: 32; Cmin: 12 SCr 0.8; IBW; Vd 0.72   PLAN: Patient received vancomycin load of 1500 mg x 1 in ED earlier today Initiate vancomycin 1000 mg IV q24H starting 24 hours from loading dose Follow up culture results to assess for antibiotic optimization. Monitor renal function to assess for any necessary antibiotic dosing changes.   Thank you for allowing pharmacy to be a part of this patient's care.  Will M. Dareen Piano, PharmD PGY-1 Pharmacy Resident 08/07/2022 7:19 PM

## 2022-08-07 NOTE — Assessment & Plan Note (Addendum)
insulin sliding scale, hold oral hypoglycemic agents

## 2022-08-07 NOTE — H&P (Addendum)
History and Physical    Patient: [name]:[id number] DOB: Oct 24, 1933 DOA: 08/07/2022 DOS: the patient was seen and examined on 08/07/2022 PCP: [name] Nurse, MD  Patient coming from: SNF  Chief Complaint:  Chief Complaint  Patient presents with   Wound Check   HPI: [name] is [name] 89 y.o. female with medical history significant of unspecified dementia.  Therefore history is primarily obtained from secondary sources.  Patient has [name] prior history of cerebral infarction and is obviously having hemiplegia and hemiparesis of the left nondominant side that seems to be chronic.  Patient has [name] tendency to cross over her left leg onto the right shin with her left foot lateral aspect resting on the dorsum of the right shin.  Patient has [name] chronic ulcer of the site of the left lateral aspect of the foot-this has been present at least since January 10, 2022 per discharge summary in the system..  Patient was evaluated by wound care today and advised to come to the ER. Further history is obtained from the EMS run report.  Apparently patient left foot has been pulseless for around [name] week and [name] half and the wound on the left foot is not healing at all.  Patient was subsequently brought to [facility] ER.  I do not have documentation of the wound care evaluation done today.  Patient's only complaint seems to be that she is having pain in the left foot especially with manipulation.  Patient otherwise does not report any complaints.   Review of Systems: unable to review all systems due to the inability of the patient to answer questions. Past Medical History:  Diagnosis Date   Carpal tunnel syndrome on both sides    Diabetes (HCC)    Diabetic polyneuropathy (HCC)    Diverticulitis    Hyperlipidemia    Hypertension    Osteoarthritis    Vaginal prolapse   Other diagnoses that are documented in the chart include vitamin D deficiency, iron deficiency peripheral vascular disease, chronic left  foot wound, hyper magnesium, protein calorie malnutrition mild, dementia, essential hypertension, prior stroke with left hemiplegia and hemiparesis of the left nondominant side.  B12 deficiency anemia type 2 diabetes mellitus with diabetic neuropathy. Past Surgical History:  Procedure Laterality Date   CESAREAN SECTION     IRRIGATION AND DEBRIDEMENT FOOT Left 01/09/2022   Procedure: IRRIGATION AND DEBRIDEMENT FOOT WITH BONE BIOPSY;  Surgeon: [name], DPM;  Location: [facility] ORS;  Service: Podiatry;  Laterality: Left;   LOWER EXTREMITY ANGIOGRAPHY Left 06/11/2021   Procedure: Lower Extremity Angiography;  Surgeon: [name] Dills, MD;  Location: [facility] INVASIVE CV LAB;  Service: Cardiovascular;  Laterality: Left;   pessary     REPLACEMENT TOTAL KNEE BILATERAL     VENTRAL HERNIA REPAIR     VENTRAL HERNIA REPAIR N/[name] 01/13/2016   Procedure: HERNIA REPAIR VENTRAL ADULT WITH SMALL BOWEL RESECTION, LYSIS OF ADHESIONS;  Surgeon: [name] Riffle, MD;  Location: [facility] ORS;  Service: General;  Laterality: N/[name];   Social History:  reports that she has never smoked. She has never used smokeless tobacco. She reports that she does not drink alcohol and does not use drugs.  Allergies  Allergen Reactions   Metformin And Related Nausea And Vomiting    Family History  Problem Relation Age of Onset   Breast cancer Mother 37   Breast cancer Maternal Aunt     Prior to Admission medications   Medication Sig Start Date End Date Taking?  Authorizing Provider  acetaminophen (TYLENOL) 325 MG tablet Take 2 tablets (650 mg total) by mouth every 6 (six) hours as needed for mild pain, moderate pain, fever or headache. 01/25/16  Yes Loflin, Laney Potash, MD  ascorbic acid (VITAMIN C) 500 MG tablet Take 500 mg by mouth 2 (two) times daily.   Yes [provider]  aspirin EC 81 MG tablet Take 81 mg by mouth daily. Swallow whole.   Yes [provider]  atorvastatin (LIPITOR) 80 MG tablet Take 1 tablet  (80 mg total) by mouth daily. Patient taking differently: Take 80 mg by mouth at bedtime. 09/26/19  Yes Regalado, Belkys A, MD  clopidogrel (PLAVIX) 75 MG tablet Take 1 tablet (75 mg total) by mouth daily. 09/26/19  Yes Regalado, Belkys A, MD  cyanocobalamin (VITAMIN B12) 1000 MCG tablet Take 1,000 mcg by mouth daily.   Yes [provider]  ferrous sulfate 325 (65 FE) MG tablet Take 325 mg by mouth every other day.   Yes [provider]  glimepiride (AMARYL) 4 MG tablet Take 4 mg by mouth daily with breakfast. 07/29/22  Yes [provider]  hydrochlorothiazide (HYDRODIURIL) 12.5 MG tablet Take 12.5 mg by mouth daily. 07/18/22  Yes [provider]  lisinopril (ZESTRIL) 10 MG tablet Take 10 mg by mouth daily. 08/01/22  Yes [provider]  magnesium oxide (MAG-OX) 400 (240 Mg) MG tablet Take 400 mg by mouth daily.   Yes [provider]  Multiple Vitamins-Minerals (MULTIVITAMIN WITH MINERALS) tablet Take 1 tablet by mouth daily.   Yes [provider]  Vitamin D, Ergocalciferol, (DRISDOL) 1.25 MG (50000 UNIT) CAPS capsule Take 50,000 Units by mouth every 7 (seven) days. thursday   Yes [provider]  Amino Acids-Protein Hydrolys (FEEDING SUPPLEMENT, PRO-STAT SUGAR FREE 64,) LIQD Take 30 mLs by mouth in the morning and at bedtime.    [provider]  lisinopril (ZESTRIL) 5 MG tablet Take 5 mg by mouth daily. Patient not taking: Reported on 08/07/2022    [provider]    Physical Exam: Vitals:   08/07/22 1602 08/07/22 1603 08/07/22 1900  BP: (!) 140/78  (!) 146/71  Pulse: 82    Resp: 17  (!) 26  Temp: 97.8 F (36.6 C)    TempSrc: Oral    SpO2: 99%    Weight:  68 kg   Height:  5\' 6"  (1.676 m)    General: Thin lady does not appear to be in any distress.  Talkative.  However she is not oriented to her location not even the state, she was reoriented by myself and staff RN.  She does not know date or time.  She  was not able to give me her date of birth.  She was only able to give me her full name Respiratory exam: Bilateral intravesicular Cardiovascular exam S1-S2 normal Abdomen soft nontender thin Extremities patient has obvious left hemiplegia.  With minimal effort demonstrated at ankle on the left and left shoulder.  Patient typically keeps her left leg crossed up and over the right shin.  And her left foot lateral aspect rests on the lateral aspect of the right shin.  This area has an ulcer.  See representative photo.  The on probing it is obviously bone deep.  I could not express any discharge there is not [name] lot of foul smell.  However the whole foot is swollen compared to the right side and appears slightly erythematous.    Media Information  Document Information  Photos  Left foot  08/07/2022 19:23  Attached To:  Hospital Encounter on 08/07/22  Source Information  Nolberto Hanlon, MD  [facility]    Data Reviewed:  Labs on Admission:  Results for orders placed or performed during the hospital encounter of 08/07/22 (from the past 24 hour(s))  CBC     Status: Abnormal   Collection Time: 08/07/22  4:05 PM  Result Value Ref Range   WBC 11.1 (H) 4.0 - 10.5 K/uL   RBC 3.30 (L) 3.87 - 5.11 MIL/uL   Hemoglobin 10.0 (L) 12.0 - 15.0 g/dL   HCT 21.3 (L) 08.6 - 57.8 %   MCV 97.9 80.0 - 100.0 fL   MCH 30.3 26.0 - 34.0 pg   MCHC 31.0 30.0 - 36.0 g/dL   RDW 46.9 62.9 - 52.8 %   Platelets 345 150 - 400 K/uL   nRBC 0.0 0.0 - 0.2 %  Comprehensive metabolic panel     Status: Abnormal   Collection Time: 08/07/22  4:05 PM  Result Value Ref Range   Sodium 136 135 - 145 mmol/L   Potassium 4.2 3.5 - 5.1 mmol/L   Chloride 97 (L) 98 - 111 mmol/L   CO2 27 22 - 32 mmol/L   Glucose, Bld 344 (H) 70 - 99 mg/dL   BUN 35 (H) 8 - 23 mg/dL   Creatinine, Ser 4.13 0.44 - 1.00 mg/dL   Calcium 9.1 8.9 - 24.4 mg/dL   Total Protein 7.1 6.5 - 8.1 g/dL   Albumin 3.2 (L) 3.5 - 5.0 g/dL   AST 17 15  - 41 U/L   ALT 15 0 - 44 U/L   Alkaline Phosphatase 105 38 - 126 U/L   Total Bilirubin 0.4 0.3 - 1.2 mg/dL   GFR, Estimated >01 >02 mL/min   Anion gap 12 5 - 15  Lactic acid, plasma     Status: None   Collection Time: 08/07/22  4:05 PM  Result Value Ref Range   Lactic Acid, Venous 1.8 0.5 - 1.9 mmol/L   Basic Metabolic Panel: Recent Labs  Lab 08/07/22 1605  NA 136  K 4.2  CL 97*  CO2 27  GLUCOSE 344*  BUN 35*  CREATININE 0.58  CALCIUM 9.1   Liver Function Tests: Recent Labs  Lab 08/07/22 1605  AST 17  ALT 15  ALKPHOS 105  BILITOT 0.4  PROT 7.1  ALBUMIN 3.2*   No results for input(s): "LIPASE", "AMYLASE" in the last 168 hours. No results for input(s): "AMMONIA" in the last 168 hours. CBC: Recent Labs  Lab 08/07/22 1605  WBC 11.1*  HGB 10.0*  HCT 32.3*  MCV 97.9  PLT 345   Cardiac Enzymes: No results for input(s): "CKTOTAL", "CKMB", "CKMBINDEX", "TROPONINIHS" in the last 168 hours.  BNP (last 3 results) No results for input(s): "PROBNP" in the last 8760 hours. CBG: No results for input(s): "GLUCAP" in the last 168 hours.  Radiological Exams on Admission:  DG Foot 2 Views Left  Result Date: 08/07/2022 CLINICAL DATA:  Open wound in skin, diabetes EXAM: LEFT FOOT - 2 VIEW COMPARISON:  01/07/2022 FINDINGS: No displaced fracture or dislocation is seen. Osteopenia is seen in bony structures. Possible erosion is seen in the lateral margin of base of fifth metatarsal. There is lucency overlying the base of fifth metatarsal, possibly suggesting open wound in the skin. There is flattening of plantar arch. Plantar spur is seen in calcaneus. There is soft tissue swelling over the dorsum.  Arterial calcifications are seen in the soft tissues. IMPRESSION: No recent fracture or dislocation is seen. There is erosive change in the lateral aspect of base of left fifth metatarsal. Possibility of osteomyelitis is not excluded. If clinically warranted, follow-up MRI may be  considered. Electronically Signed   By: Ernie Avena M.D.   On: 08/07/2022 17:45      Assessment and Plan: * Diabetic foot infection (HCC) Patient seems to have pressure ulceration of the left lateral aspect of the foot, see representative photos that has been present at least since 2023.  This seems to be stage IV and the bone is easily probed on physical exam.  Therefore I believe patient has osteomyelitis as well.  This is associated with swelling of the whole foot with erythema, therefore patient has associated cellulitis.  And I have to rule out underlying abscess given the degree of swelling, therefore I will get an MRI of the foot.  Patient has had [name] single blood culture drawn in the ER, s/p ceftriaxone and vancomycin.  At this time I will continue with vancomycin and Zosyn pending imaging studies and finding of single blood culture.  It will not help to draw second blood culture after antibiotic administration at this time.  Further there is no suitable local culture that I can draw at this time.  I will request offloading of the left foot, I discussed with RN to apply nonadhesive dressing at this time.  We will request wound care, turning position patient.  DVT prophylaxis.  Eventually, given the nonhealing nature of this wound, I will try to reach out to podiatry and see if they have an operative treatment to offer.  Left hemiparesis (HCC) Turn in position, offload heel and pressure ulceration  Malnutrition of moderate degree Continue with nutritional supplementation  Mixed hyperlipidemia Continue with atorvastatin  Type 2 diabetes mellitus with polyneuropathy (HCC) insulin sliding scale, hold oral hypoglycemic agents      Advance Care Planning:   Code Status: Full Code this is based on documentation received from patient's outside facility [facility]  Consults: I reached out to Dr. Ether Griffins of podiatry - he has seen the patient in December 2023. Kindly follow up  with him in AM to confirm he can round on the patient.   Family Communication: I tried reaching patient son [name] on [phone]. Went to [employer]. Unfortunately, the mailbox was also full. Therefore no message left.  Severity of Illness: The appropriate patient status for this patient is INPATIENT. Inpatient status is judged to be reasonable and necessary in order to provide the required intensity of service to ensure the patient's safety. The patient's presenting symptoms, physical exam findings, and initial radiographic and laboratory data in the context of their chronic comorbidities is felt to place them at high risk for further clinical deterioration. Furthermore, it is not anticipated that the patient will be medically stable for discharge from the hospital within 2 midnights of admission.   * I certify that at the point of admission it is my clinical judgment that the patient will require inpatient hospital care spanning beyond 2 midnights from the point of admission due to high intensity of service, high risk for further deterioration and high frequency of surveillance required.*  Author: Nolberto Hanlon, MD 08/07/2022 7:34 PM  For on call review [email or web address].

## 2022-08-07 NOTE — Plan of Care (Signed)
  Problem: Education: Goal: Knowledge of General Education information will improve Description Including pain rating scale, medication(s)/side effects and non-pharmacologic comfort measures Outcome: Progressing   Problem: Health Behavior/Discharge Planning: Goal: Ability to manage health-related needs will improve Outcome: Progressing   

## 2022-08-07 NOTE — Assessment & Plan Note (Signed)
Patient seems to have pressure ulceration of the left lateral aspect of the foot, see representative photos that has been present at least since 2023.  This seems to be stage IV and the bone is easily probed on physical exam.  Therefore I believe patient has osteomyelitis as well.  This is associated with swelling of the whole foot with erythema, therefore patient has associated cellulitis.  And I have to rule out underlying abscess given the degree of swelling, therefore I will get an MRI of the foot.  Patient has had a single blood culture drawn in the ER, s/p ceftriaxone and vancomycin.  At this time I will continue with vancomycin and Zosyn pending imaging studies and finding of single blood culture.  It will not help to draw second blood culture after antibiotic administration at this time.  Further there is no suitable local culture that I can draw at this time.  I will request offloading of the left foot, I discussed with RN to apply nonadhesive dressing at this time.  We will request wound care, turning position patient.  DVT prophylaxis.  Eventually, given the nonhealing nature of this wound, I will try to reach out to podiatry and see if they have an operative treatment to offer.

## 2022-08-08 DIAGNOSIS — E11628 Type 2 diabetes mellitus with other skin complications: Secondary | ICD-10-CM | POA: Diagnosis not present

## 2022-08-08 DIAGNOSIS — L089 Local infection of the skin and subcutaneous tissue, unspecified: Secondary | ICD-10-CM | POA: Diagnosis not present

## 2022-08-08 DIAGNOSIS — M86172 Other acute osteomyelitis, left ankle and foot: Secondary | ICD-10-CM | POA: Diagnosis not present

## 2022-08-08 DIAGNOSIS — L03116 Cellulitis of left lower limb: Secondary | ICD-10-CM

## 2022-08-08 DIAGNOSIS — I739 Peripheral vascular disease, unspecified: Secondary | ICD-10-CM | POA: Diagnosis not present

## 2022-08-08 LAB — CBC
HCT: 29.4 % — ABNORMAL LOW (ref 36.0–46.0)
Hemoglobin: 9.1 g/dL — ABNORMAL LOW (ref 12.0–15.0)
MCH: 30.2 pg (ref 26.0–34.0)
MCHC: 31 g/dL (ref 30.0–36.0)
MCV: 97.7 fL (ref 80.0–100.0)
Platelets: 316 10*3/uL (ref 150–400)
RBC: 3.01 MIL/uL — ABNORMAL LOW (ref 3.87–5.11)
RDW: 12.7 % (ref 11.5–15.5)
WBC: 10.1 10*3/uL (ref 4.0–10.5)
nRBC: 0 % (ref 0.0–0.2)

## 2022-08-08 LAB — FOLATE: Folate: 7.1 ng/mL (ref >5.9)

## 2022-08-08 LAB — RETICULOCYTES
Immature Retic Fract: 22.8 % — ABNORMAL HIGH (ref 2.3–15.9)
RBC.: 3.02 MIL/uL — ABNORMAL LOW (ref 3.87–5.11)
Retic Count, Absolute: 68.3 10*3/uL (ref 19.0–186.0)
Retic Ct Pct: 2.3 % (ref 0.4–3.1)

## 2022-08-08 LAB — VITAMIN B12: Vitamin B-12: 350 pg/mL (ref 180–914)

## 2022-08-08 LAB — IRON AND TIBC
Iron: 47 ug/dL (ref 28–170)
Saturation Ratios: 18 % (ref 10.4–31.8)
TIBC: 262 ug/dL (ref 250–450)
UIBC: 215 ug/dL

## 2022-08-08 LAB — CREATININE, SERUM
Creatinine, Ser: 0.49 mg/dL (ref 0.44–1.00)
GFR, Estimated: >60 mL/min (ref >60)

## 2022-08-08 LAB — GLUCOSE, CAPILLARY
Glucose-Capillary: 96 mg/dL (ref 70–99)
Glucose-Capillary: 159 mg/dL — ABNORMAL HIGH (ref 70–99)
Glucose-Capillary: 132 mg/dL — ABNORMAL HIGH (ref 70–99)
Glucose-Capillary: 129 mg/dL — ABNORMAL HIGH (ref 70–99)

## 2022-08-08 LAB — PROTIME-INR
INR: 1.2 (ref 0.8–1.2)
Prothrombin Time: 15.3 seconds — ABNORMAL HIGH (ref 11.4–15.2)

## 2022-08-08 LAB — APTT: aPTT: 38 seconds — ABNORMAL HIGH (ref 24–36)

## 2022-08-08 LAB — FERRITIN: Ferritin: 243 ng/mL (ref 11–307)

## 2022-08-08 LAB — MRSA NEXT GEN BY PCR, NASAL: MRSA by PCR Next Gen: NOT DETECTED

## 2022-08-08 NOTE — Consult Note (Signed)
ORTHOPAEDIC CONSULTATION  REQUESTING PHYSICIAN: Marrion Coy, MD  Chief Complaint: Ulcer left foot with osteomyelitis  HPI: [name] is a 89 y.o. female who complains of worsening wound to her left foot.  She has a longstanding history of an ulcer.  Was seen in the hospital back in late November early December of last year.  Biopsy was performed which was benign.  Local wound care was being performed at that time.  Presents with worsening drainage to the left foot and possible infection.  CT scan has been performed with concern for osteomyelitis.  Vascular surgery has evaluated and are planning on left lower extremity angiogram with possible intervention on Monday.  Past Medical History:  Diagnosis Date   Carpal tunnel syndrome on both sides    Diabetes (HCC)    Diabetic polyneuropathy (HCC)    Diverticulitis    Hyperlipidemia    Hypertension    Osteoarthritis    Vaginal prolapse    Past Surgical History:  Procedure Laterality Date   CESAREAN SECTION     IRRIGATION AND DEBRIDEMENT FOOT Left 01/09/2022   Procedure: IRRIGATION AND DEBRIDEMENT FOOT WITH BONE BIOPSY;  Surgeon: Gwyneth Revels, DPM;  Location: [facility] ORS;  Service: Podiatry;  Laterality: Left;   LOWER EXTREMITY ANGIOGRAPHY Left 06/11/2021   Procedure: Lower Extremity Angiography;  Surgeon: Renford Dills, MD;  Location: [facility] IN[facility];  Service: Cardiovascular;  Laterality: Left;   pessary     REPLACEMENT TOTAL KNEE BILATERAL     VENTRAL HERNIA REPAIR     VENTRAL HERNIA REPAIR N/A 01/13/2016   Procedure: HERNIA REPAIR VENTRAL ADULT WITH SMALL BOWEL RESECTION, LYSIS OF ADHESIONS;  Surgeon: Gladis Riffle, MD;  Location: [facility] ORS;  Service: General;  Laterality: N/A;   Social History   Socioeconomic History   Marital status: Widowed    Spouse name: Not on file   Number of children: Not on file   Years of education: Not on file   Highest education level: Not on file  Occupational History   Not on  file  Tobacco Use   Smoking status: Never   Smokeless tobacco: Never  Vaping Use   Vaping Use: Never used  Substance and Sexual Activity   Alcohol use: No   Drug use: No   Sexual activity: Not Currently  Other Topics Concern   Not on file  Social History Narrative   Lives at home by herself. Ambulates with a cane.   Social Determinants of Health   Financial Resource Strain: Not on file  Food Insecurity: Not on file  Transportation Needs: Not on file  Physical Activity: Not on file  Stress: Not on file  Social Connections: Not on file   Family History  Problem Relation Age of Onset   Breast cancer Mother 6   Breast cancer Maternal Aunt    Allergies  Allergen Reactions   Metformin And Related Nausea And Vomiting   Prior to Admission medications   Medication Sig Start Date End Date Taking? Authorizing Provider  acetaminophen (TYLENOL) 325 MG tablet Take 2 tablets (650 mg total) by mouth every 6 (six) hours as needed for mild pain, moderate pain, fever or headache. 01/25/16  Yes Loflin, Laney Potash, MD  ascorbic acid (VITAMIN C) 500 MG tablet Take 500 mg by mouth 2 (two) times daily.   Yes [provider]  aspirin EC 81 MG tablet Take 81 mg by mouth daily. Swallow whole.   Yes [provider]  atorvastatin (LIPITOR) 80  MG tablet Take 1 tablet (80 mg total) by mouth daily. Patient taking differently: Take 80 mg by mouth at bedtime. 09/26/19  Yes Regalado, Belkys A, MD  clopidogrel (PLAVIX) 75 MG tablet Take 1 tablet (75 mg total) by mouth daily. 09/26/19  Yes Regalado, Belkys A, MD  cyanocobalamin (VITAMIN B12) 1000 MCG tablet Take 1,000 mcg by mouth daily.   Yes [provider]  ferrous sulfate 325 (65 FE) MG tablet Take 325 mg by mouth every other day.   Yes [provider]  glimepiride (AMARYL) 4 MG tablet Take 4 mg by mouth daily with breakfast. 07/29/22  Yes [provider]  hydrochlorothiazide (HYDRODIURIL) 12.5 MG tablet Take  12.5 mg by mouth daily. 07/18/22  Yes [provider]  lisinopril (ZESTRIL) 10 MG tablet Take 10 mg by mouth daily. 08/01/22  Yes [provider]  magnesium oxide (MAG-OX) 400 (240 Mg) MG tablet Take 400 mg by mouth daily.   Yes [provider]  Multiple Vitamins-Minerals (MULTIVITAMIN WITH MINERALS) tablet Take 1 tablet by mouth daily.   Yes [provider]  Vitamin D, Ergocalciferol, (DRISDOL) 1.25 MG (50000 UNIT) CAPS capsule Take 50,000 Units by mouth every 7 (seven) days. thursday   Yes [provider]  Amino Acids-Protein Hydrolys (FEEDING SUPPLEMENT, PRO-STAT SUGAR FREE 64,) LIQD Take 30 mLs by mouth in the morning and at bedtime.    [provider]  lisinopril (ZESTRIL) 5 MG tablet Take 5 mg by mouth daily. Patient not taking: Reported on 08/07/2022    [provider]   CT FOOT LEFT W CONTRAST  Result Date: 08/07/2022 CLINICAL DATA:  Osteomyelitis, rule out abscess. EXAM: CT OF THE LOWER LEFT EXTREMITY WITH CONTRAST TECHNIQUE: Multidetector CT imaging of the lower left extremity was performed according to the standard protocol following intravenous contrast administration. RADIATION DOSE REDUCTION: This exam was performed according to the departmental dose-optimization program which includes automated exposure control, adjustment of the mA and/or kV according to patient size and/or use of iterative reconstruction technique. CONTRAST:  OMNIPAQUE IOHEXOL 300 MG/ML  SOLN COMPARISON:  None Available. FINDINGS: Bones/Joint/Cartilage Sclerosis of the base of the fifth metatarsal and proximal shaft, adjacent to the deep skin wound concerning for osteomyelitis. There is severe osteopenia.  No acute fracture or dislocation. Ligaments Suboptimally assessed by CT. Muscles and Tendons Flexor and extensor tendons appear intact. Achilles tendon appear intact. Soft tissues Marked skin thickening and subcutaneous soft tissue edema about the ankle  and foot. Deep skin wound about the lateral aspect of the base of the fifth metatarsal. No fluid collection or abscess identified. IMPRESSION: 1. Deep skin wound about the lateral aspect of the base of the fifth metatarsal. Sclerosis of the base of the fifth metatarsal and proximal shaft, adjacent to the deep skin wound, concerning for osteomyelitis. 2. Marked skin thickening and subcutaneous soft tissue edema about the ankle and foot, consistent with cellulitis. No fluid collection or abscess identified. 3. Severe osteopenia. Electronically Signed   By: Larose Hires D.O.   On: 08/07/2022 20:58   DG Chest Port 1 View  Result Date: 08/07/2022 CLINICAL DATA:  Preoperative study. EXAM: PORTABLE CHEST 1 VIEW COMPARISON:  Chest x-ray dated January 07, 2022. FINDINGS: The heart size and mediastinal contours are within normal limits. Normal pulmonary vascularity. No focal consolidation, pleural effusion, or pneumothorax. No acute osseous abnormality. IMPRESSION: No active disease. Electronically Signed   By: Obie Dredge M.D.   On: 08/07/2022 20:30   DG Foot 2  Views Left  Result Date: 08/07/2022 CLINICAL DATA:  Open wound in skin, diabetes EXAM: LEFT FOOT - 2 VIEW COMPARISON:  01/07/2022 FINDINGS: No displaced fracture or dislocation is seen. Osteopenia is seen in bony structures. Possible erosion is seen in the lateral margin of base of fifth metatarsal. There is lucency overlying the base of fifth metatarsal, possibly suggesting open wound in the skin. There is flattening of plantar arch. Plantar spur is seen in calcaneus. There is soft tissue swelling over the dorsum. Arterial calcifications are seen in the soft tissues. IMPRESSION: No recent fracture or dislocation is seen. There is erosive change in the lateral aspect of base of left fifth metatarsal. Possibility of osteomyelitis is not excluded. If clinically warranted, follow-up MRI may be considered. Electronically Signed   By: Ernie Avena M.D.    On: 08/07/2022 17:45    Positive ROS: All other systems have been reviewed and were otherwise negative with the exception of those mentioned in the HPI and as above.  12 point ROS was performed.  Physical Exam: General: Alert and oriented.  No apparent distress.  Vascular:  Left foot:Dorsalis Pedis:  absent Posterior Tibial:  absent  Right foot:  Not evaluated  Neuro:absent protective sensation  Derm: Along the lateral aspect of the left foot at the fifth metatarsal base there is a full-thickness ulcer measuring 2 x 2 cm.  Bone is exposed and prominent in the area.  There is surrounding areas of superficial skin peeling and mild necrotic tissues to the surrounding ulcerative site.  The entire left lower leg from the ankle distally has a dark discolored appearance.  Ortho/MS: Disuse atrophy of the lower extremity is noted.  Patient is basically laying in bed in a flexion contracture position.  Personally reviewed the x-rays and CT scan.  This is consistent with osteomyelitis of the fifth metatarsal base.  Assessment: Osteomyelitis fifth metatarsal base Severe peripheral vascular disease Diabetes with neuropathy  Plan: She has obvious exposed bone with osteomyelitis on x-ray.  She has some mild drainage and darkened discoloration to the surrounding tissue.  Vascular surgery is planning revascularization to the area.  We can give consideration to surgical excision of the fifth metatarsal base.  If this fails she would likely need a below the knee amputation.  I suspect this patient is nonambulatory given the flexion contracture that she has at this time.  She was not able to extend her legs out for me in the hospital bed today.  Continue with padding dressings for now.  Podiatry will follow-up next week after attempted revascularization.  Alternatively patient/family  may want to consider palliative care to this left lower extremity,  given the risks associated with anesthesia as well as  concern for healing potential to this left lower extremity.    Irean Hong, DPM Cell [phone]   08/08/2022 1:35 PM

## 2022-08-08 NOTE — Consult Note (Signed)
WOC Nurse Consult Note: Reason for Consult:left lateral foot full thickness wound. WOC Nursing is simultaneously consulted with Podiatry (Dr. Ether Griffins) who has seen patient in the past. I will provide preliminary orders for Nursing Wound type:full thickness Pressure Injury POA: N/A Measurement:3cm x 5cm with depth obscured by the presence of nonviable tissue Wound bed:See photo provided by EDP Drainage (amount, consistency, odor) Scant serosanguinous Periwound: intact with evidence of previous wound healing (scarring) Dressing procedure/placement/frequency: I will provide Nursing with guidance for the care of this wound using a soap and water cleanse followed by painting the lesion with a povidone-iodine swabstick and allowing it to air dry. When dry, the wound is to be covered with dry gauze and secured with a few turns of Kerlix roll gauze/paper tape and foot placed into a Prevalon boot.  Any orders provided by Dr. Ether Griffins or another Provider will supercede any I provide today.  WOC nursing team will not follow, but will remain available to this patient, the nursing and medical teams.  Please re-consult if needed.  Thank you for inviting [place] to participate in this patient's Plan of Care.  Ladona Mow, MSN, RN, CNS, GNP, Leda Min, [employer], [employer] phone:  [phone]
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
## 2022-08-08 NOTE — Hospital Course (Addendum)
[name] is a 89 y.o. female with medical history significant of unspecified dementia, type 2 diabetes, diabetic polyneuropathy, essential hypertension and dyslipidemia who was sent over by wound care for worsening left foot ulcer with infection. CT scan showed significant cellulitis and concern for fifth metatarsal osteomyelitis.  Patient was started on Zosyn and vancomycin. 7/1: S/p LLE angio and successful recanalization of the left lower extremity for limb salvage 7/3: Fifth ray amputation left foot & Wound VAC placement left lateral foot  Patient currently pending nursing home placement.

## 2022-08-08 NOTE — Progress Notes (Signed)
Patient arrived to room. Alert and oriented to self and place. Unable to complete admission questions d/t patient's confusion. Bed alarm on.
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
## 2022-08-08 NOTE — Consult Note (Signed)
Hospital Consult    Reason for Consult:  PAD with left foot Osteomyelitis Requesting Physician:  Dr Marrion Coy MD MRN #:  [id number]  History of Present Illness: This is a 89 y.o. female [name] is a 89 y.o. female with medical history significant of unspecified dementia, type 2 diabetes, diabetic polyneuropathy, essential hypertension and dyslipidemia who was sent over by wound care for worsening left foot ulcer with infection. CT scan showed significant cellulitis and concern for fifth metatarsal osteomyelitis.  Podiatry consult was obtained, patient was started on Zosyn and vancomycin.  Vascular Surgery was consulted. On exam patient was resting comfortably in bed. Bandage was on the patients left foot. Please see media files. Patient endorses pain to her left foot. States "it's very difficult to walk". No other complaints and vitals all remain stable.    Past Medical History:  Diagnosis Date   Carpal tunnel syndrome on both sides    Diabetes (HCC)    Diabetic polyneuropathy (HCC)    Diverticulitis    Hyperlipidemia    Hypertension    Osteoarthritis    Vaginal prolapse     Past Surgical History:  Procedure Laterality Date   CESAREAN SECTION     IRRIGATION AND DEBRIDEMENT FOOT Left 01/09/2022   Procedure: IRRIGATION AND DEBRIDEMENT FOOT WITH BONE BIOPSY;  Surgeon: Gwyneth Revels, DPM;  Location: [facility] ORS;  Service: Podiatry;  Laterality: Left;   LOWER EXTREMITY ANGIOGRAPHY Left 06/11/2021   Procedure: Lower Extremity Angiography;  Surgeon: Renford Dills, MD;  Location: [facility];  Service: Cardiovascular;  Laterality: Left;   pessary     REPLACEMENT TOTAL KNEE BILATERAL     VENTRAL HERNIA REPAIR     VENTRAL HERNIA REPAIR N/A 01/13/2016   Procedure: HERNIA REPAIR VENTRAL ADULT WITH SMALL BOWEL RESECTION, LYSIS OF ADHESIONS;  Surgeon: Gladis Riffle, MD;  Location: [facility] ORS;  Service: General;  Laterality: N/A;    Allergies  Allergen Reactions    Metformin And Related Nausea And Vomiting    Prior to Admission medications   Medication Sig Start Date End Date Taking? Authorizing Provider  acetaminophen (TYLENOL) 325 MG tablet Take 2 tablets (650 mg total) by mouth every 6 (six) hours as needed for mild pain, moderate pain, fever or headache. 01/25/16  Yes Loflin, Laney Potash, MD  ascorbic acid (VITAMIN C) 500 MG tablet Take 500 mg by mouth 2 (two) times daily.   Yes [provider]  aspirin EC 81 MG tablet Take 81 mg by mouth daily. Swallow whole.   Yes [provider]  atorvastatin (LIPITOR) 80 MG tablet Take 1 tablet (80 mg total) by mouth daily. Patient taking differently: Take 80 mg by mouth at bedtime. 09/26/19  Yes Regalado, Belkys A, MD  clopidogrel (PLAVIX) 75 MG tablet Take 1 tablet (75 mg total) by mouth daily. 09/26/19  Yes Regalado, Belkys A, MD  cyanocobalamin (VITAMIN B12) 1000 MCG tablet Take 1,000 mcg by mouth daily.   Yes [provider]  ferrous sulfate 325 (65 FE) MG tablet Take 325 mg by mouth every other day.   Yes [provider]  glimepiride (AMARYL) 4 MG tablet Take 4 mg by mouth daily with breakfast. 07/29/22  Yes [provider]  hydrochlorothiazide (HYDRODIURIL) 12.5 MG tablet Take 12.5 mg by mouth daily. 07/18/22  Yes [provider]  lisinopril (ZESTRIL) 10 MG tablet Take 10 mg by mouth daily. 08/01/22  Yes [provider]  magnesium oxide (MAG-OX) 400 (240 Mg) MG  tablet Take 400 mg by mouth daily.   Yes [provider]  Multiple Vitamins-Minerals (MULTIVITAMIN WITH MINERALS) tablet Take 1 tablet by mouth daily.   Yes [provider]  Vitamin D, Ergocalciferol, (DRISDOL) 1.25 MG (50000 UNIT) CAPS capsule Take 50,000 Units by mouth every 7 (seven) days. thursday   Yes [provider]  Amino Acids-Protein Hydrolys (FEEDING SUPPLEMENT, PRO-STAT SUGAR FREE 64,) LIQD Take 30 mLs by mouth in the morning and at bedtime.    [provider]  lisinopril (ZESTRIL) 5 MG tablet Take 5 mg by mouth daily. Patient not taking: Reported on 08/07/2022    [provider]    Social History   Socioeconomic History   Marital status: Widowed    Spouse name: Not on file   Number of children: Not on file   Years of education: Not on file   Highest education level: Not on file  Occupational History   Not on file  Tobacco Use   Smoking status: Never   Smokeless tobacco: Never  Vaping Use   Vaping Use: Never used  Substance and Sexual Activity   Alcohol use: No   Drug use: No   Sexual activity: Not Currently  Other Topics Concern   Not on file  Social History Narrative   Lives at home by herself. Ambulates with a cane.   Social Determinants of Health   Financial Resource Strain: Not on file  Food Insecurity: Not on file  Transportation Needs: Not on file  Physical Activity: Not on file  Stress: Not on file  Social Connections: Not on file  Intimate Partner Violence: Not on file     Family History  Problem Relation Age of Onset   Breast cancer Mother 72   Breast cancer Maternal Aunt     ROS: Otherwise negative unless mentioned in HPI  Physical Examination  Vitals:   08/07/22 2210 08/08/22 0808  BP: 139/64 (!) 145/69  Pulse: 88 81  Resp: 18 16  Temp: 97.8 F (36.6 C) 98 F (36.7 C)  SpO2: 99% 97%   Body mass index is 24.2 kg/m.  General:  WDWN in NAD Gait: Not observed HENT: WNL, normocephalic Pulmonary: normal non-labored breathing, without Rales, rhonchi,  wheezing Cardiac: regular, without  Murmurs, rubs or gallops; without carotid bruits Abdomen: Positive bowel sounds, soft, NT/ND, no masses Skin: without rashes Vascular Exam/Pulses: Very Faint doppler pulses to left PT/DP. Edema +2 to left foot.  Extremities: with ischemic changes, with Gangrene , without cellulitis; with open wounds;  Musculoskeletal: no muscle wasting or atrophy  Neurologic: A&O X 3;  No focal weakness or  paresthesias are detected; speech is fluent/normal Psychiatric:  The pt has Normal affect. Lymph:  Unremarkable  CBC    Component Value Date/Time   WBC 10.1 08/08/2022 0338   RBC 3.02 (L) 08/08/2022 0338   RBC 3.01 (L) 08/08/2022 0338   HGB 9.1 (L) 08/08/2022 0338   HGB 8.6 (L) 09/25/2013 0410   HCT 29.4 (L) 08/08/2022 0338   HCT 27.0 (L) 09/24/2013 0409   PLT 316 08/08/2022 0338   PLT 192 09/24/2013 0409   MCV 97.7 08/08/2022 0338   MCV 96 09/24/2013 0409   MCH 30.2 08/08/2022 0338   MCHC 31.0 08/08/2022 0338   RDW 12.7 08/08/2022 0338   RDW 13.8 09/24/2013 0409   LYMPHSABS 1.0 01/07/2022 1044   LYMPHSABS 2.5 09/24/2013 0409   MONOABS 0.6 01/07/2022 1044   MONOABS 0.7 09/24/2013 0409   EOSABS 0.0  01/07/2022 1044   EOSABS 0.4 09/24/2013 0409   BASOSABS 0.0 01/07/2022 1044   BASOSABS 0.0 09/24/2013 0409    BMET    Component Value Date/Time   NA 136 08/07/2022 1605   NA 143 09/24/2013 0409   K 4.2 08/07/2022 1605   K 3.6 09/24/2013 0409   CL 97 (L) 08/07/2022 1605   CL 108 (H) 09/24/2013 0409   CO2 27 08/07/2022 1605   CO2 27 09/24/2013 0409   GLUCOSE 344 (H) 08/07/2022 1605   GLUCOSE 222 (H) 09/24/2013 0409   BUN 35 (H) 08/07/2022 1605   BUN 7 09/24/2013 0409   CREATININE 0.49 08/08/2022 0338   CREATININE 0.77 09/24/2013 0409   CALCIUM 9.1 08/07/2022 1605   CALCIUM 7.7 (L) 09/24/2013 0409   GFRNONAA >60 08/08/2022 0338   GFRNONAA >60 09/24/2013 0409   GFRAA >60 09/26/2019 1952   GFRAA >60 09/24/2013 0409    COAGS: Lab Results  Component Value Date   INR 1.2 08/08/2022     Non-Invasive Vascular Imaging:   EXAM: CT OF THE LOWER LEFT EXTREMITY WITH CONTRAST   TECHNIQUE: Multidetector CT imaging of the lower left extremity was performed according to the standard protocol following intravenous contrast administration.   RADIATION DOSE REDUCTION: This exam was performed according to the departmental dose-optimization program which includes  automated exposure control, adjustment of the mA and/or kV according to patient size and/or use of iterative reconstruction technique.   CONTRAST:  OMNIPAQUE IOHEXOL 300 MG/ML  SOLN   COMPARISON:  None Available.   FINDINGS: Bones/Joint/Cartilage   Sclerosis of the base of the fifth metatarsal and proximal shaft, adjacent to the deep skin wound concerning for osteomyelitis.   There is severe osteopenia.  No acute fracture or dislocation.   Ligaments   Suboptimally assessed by CT.   Muscles and Tendons   Flexor and extensor tendons appear intact. Achilles tendon appear intact.   Soft tissues   Marked skin thickening and subcutaneous soft tissue edema about the ankle and foot. Deep skin wound about the lateral aspect of the base of the fifth metatarsal. No fluid collection or abscess identified.   IMPRESSION: 1. Deep skin wound about the lateral aspect of the base of the fifth metatarsal. Sclerosis of the base of the fifth metatarsal and proximal shaft, adjacent to the deep skin wound, concerning for osteomyelitis. 2. Marked skin thickening and subcutaneous soft tissue edema about the ankle and foot, consistent with cellulitis. No fluid collection or abscess identified. 3. Severe osteopenia.  LOWER EXTREMITY DOPPLER STUDY   Patient Name:  [name]  Date of Exam:   07/11/2021  Medical Rec #: [id number]         Accession #:    [id number]  Date of Birth: 02-Apr-1933        Patient Gender: F  Patient Age:   68 years  Exam Location:  [facility]  Procedure:      VAS [place] ABI WITH/WO TBI  Referring Phys: GREGORY SCHNIER    ---------------------------------------------------------------------------  -----    Indications: Peripheral artery disease.     Vascular Interventions: 06/11/21: Left SFA/popliteal stent & peroneal  artery PTA;                          occluded PTA/ATA;.   Performing Technologist: Jamse Mead RT, RDMS, RVT      Examination Guidelines: A complete evaluation includes at minimum, Doppler  waveform signals and  systolic blood pressure reading at the level of  bilateral  brachial, anterior tibial, and posterior tibial arteries, when vessel  segments  are accessible. Bilateral testing is considered an integral part of a  complete  examination. Photoelectric Plethysmograph (PPG) waveforms and toe systolic  pressure readings are included as required and additional duplex testing  as  needed. Limited examinations for reoccurring indications may be performed  as  noted.     ABI Findings:  +---------+------------------+-----+----------+--------+  Right   Rt Pressure (mmHg)IndexWaveform  Comment   +---------+------------------+-----+----------+--------+  Brachial 201                                        +---------+------------------+-----+----------+--------+  ATA     224               1.11 monophasic          +---------+------------------+-----+----------+--------+  PTA     193               0.96 monophasic          +---------+------------------+-----+----------+--------+  Great Toe64                0.32 Abnormal            +---------+------------------+-----+----------+--------+   +---------+------------------+-----+----------+----------------------------  ----+  Left    Lt Pressure (mmHg)IndexWaveform  Comment                            +---------+------------------+-----+----------+----------------------------  ----+  ATA     220               1.09 monophasiccollateral fed                     +---------+------------------+-----+----------+----------------------------  ----+  PTA                            monophasicnon-compressible;  collateral fed  +---------+------------------+-----+----------+----------------------------  ----+  Great Toe74                0.37 Abnormal                                      +---------+------------------+-----+----------+----------------------------  ----+     Summary:  Right: The right toe-brachial index is abnormal.  Although ankle brachial indices are within normal limits (0.95-1.29),  arterial Doppler waveforms at the ankle suggest some component of arterial  occlusive disease.    Left: Resting left ankle-brachial index indicates noncompressible left  lower extremity arteries. The left toe-brachial index is abnormal.   Statin:  Yes.   Beta Blocker:  No. Aspirin:  Yes.   ACEI:  Yes.   ARB:  No. CCB use:  No Other antiplatelets/anticoagulants:  Yes.   Plavix 75 mg Daily   ASSESSMENT/PLAN: This is a 89 y.o. female with worsening left foot ulcer and infection. She was sent to [facility] from wound care clinic. Podiatry has been consulted.   Vascular Surgery plans on taking the patient to the vascular lab for left lower extremity angiogram with possible intervention on Monday 08/11/2022. I discussed with the patient in detail the procedure, benefits, risks and complications. She verbalizes her understanding. I answered all of her questions. She verbalizes she would like  to proceed with the procedure as soon as possible. Patient will be made NPO Sunday at midnight prior to procedure.    -I discussed the plan in detail with Dr Vilinda Flake MD and he agrees with the plan.    Marcie Bal [facility] 08/08/2022 12:39 PM

## 2022-08-08 NOTE — [place] FL2 (Signed)
[place] MEDICAID FL2 LEVEL OF CARE FORM     IDENTIFICATION  Patient Name: [name] Birthdate: 1933-02-22 Sex: female Admission Date (Current Location): 08/07/2022  [facility] and [place] Number:  Chiropodist and Address:  [facility], [address], [place], [place] [address]      Provider Number: [id number]  Attending Physician Name and Address:  Marrion Coy, MD  Relative Name and Phone Number:  Lilliahna, Parlato (Son) [phone] [facility])    Current Level of Care: Hospital Recommended Level of Care: Skilled Nursing Facility Prior Approval Number:    Date Approved/Denied:   PASRR Number: [id number]  Discharge Plan:      Current Diagnoses: Patient Active Problem List   Diagnosis Date Noted   Diabetic foot infection (HCC) 08/07/2022   Hemiplegia affecting left side in left-dominant patient as late effect of cerebrovascular disease (HCC) 01/10/2022   Vaginal candidiasis 01/10/2022   Leg wound, left 01/08/2022   Hypoglycemia 01/07/2022   Leukocytosis 01/07/2022   MRI contraindicated due to metal implant 01/07/2022   Open wound of left foot 01/07/2022   Atherosclerosis of native arteries of the extremities with ulceration (HCC) 06/06/2021   Pressure injury of skin 06/01/2021   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   PAD (peripheral artery disease) (HCC) 05/17/2021   Right hemiparesis (HCC) 05/16/2021   Left leg cellulitis 05/15/2021   Hypokalemia 05/15/2021   CVA (cerebral vascular accident) (HCC) 11/14/2019   History of stroke 10/07/2019   Altered mental status 10/06/2019   Carotid artery disease (HCC) 09/28/2019   Left hemiparesis (HCC) 09/28/2019   UTI (urinary tract infection) 09/20/2019   Sepsis (HCC) 03/22/2018   Primary osteoarthritis of both hips 11/06/2017   Microalbuminuria 11/02/2017   Cystocele, midline 09/04/2017   Incomplete uterine prolapse 09/04/2017   Type 2 diabetes mellitus with microalbuminuria, without  long-term current use of insulin (HCC) 07/27/2017   Ankle edema 04/27/2017   Pure hypercholesterolemia 03/30/2016   Malnutrition of moderate degree 01/15/2016   Dehydration    Incarcerated ventral hernia    Diabetic ketoacidosis without coma associated with type 2 diabetes mellitus (HCC)    Type 2 diabetes mellitus with polyneuropathy (HCC) 08/06/2015   Carpal tunnel syndrome, bilateral 07/10/2015   Osteopenia of multiple sites 07/10/2015   Arthralgia of multiple sites 06/27/2015   Mixed hyperlipidemia 11/06/2014   Hypomagnesemia 11/02/2013   Essential hypertension 11/02/2013    Orientation RESPIRATION BLADDER Height & Weight     Self, Place  Normal External catheter Weight: 149 lb 14.6 oz (68 kg) Height:  5\' 6"  (167.6 cm)  BEHAVIORAL SYMPTOMS/MOOD NEUROLOGICAL BOWEL NUTRITION STATUS        Diet (heart healthy/carb modified)  AMBULATORY STATUS COMMUNICATION OF NEEDS Skin     Verbally Other (Comment) (diabetic foot ulcer)                       Personal Care Assistance Level of Assistance  Bathing, Feeding, Dressing           Functional Limitations Info             SPECIAL CARE FACTORS FREQUENCY                       Contractures      Additional Factors Info  Code Status, Allergies Code Status Info: full Allergies Info: Metformin and related           Current Medications (08/08/2022):  This is the current hospital active  medication list Current Facility-Administered Medications  Medication Dose Route Frequency Provider Last Rate Last Admin   acetaminophen (TYLENOL) tablet 650 mg  650 mg Oral Q6H PRN Nolberto Hanlon, MD       Or   acetaminophen (TYLENOL) suppository 650 mg  650 mg Rectal Q6H PRN Nolberto Hanlon, MD       aspirin EC tablet 81 mg  81 mg Oral Daily Nolberto Hanlon, MD   81 mg at 08/08/22 0959   atorvastatin (LIPITOR) tablet 80 mg  80 mg Oral q1800 Nolberto Hanlon, MD       clopidogrel (PLAVIX) tablet 75 mg  75 mg Oral Daily Nolberto Hanlon, MD   75 mg  at 08/08/22 4132   cyanocobalamin (VITAMIN B12) tablet 1,000 mcg  1,000 mcg Oral Daily Nolberto Hanlon, MD   1,000 mcg at 08/08/22 0959   enoxaparin (LOVENOX) injection 40 mg  40 mg Subcutaneous Q24H Nolberto Hanlon, MD   40 mg at 08/08/22 0843   feeding supplement (PRO-STAT SUGAR FREE 64) liquid 30 mL  30 mL Oral BID Nolberto Hanlon, MD       ferrous sulfate tablet 325 mg  325 mg Oral Terrilyn Saver, MD   325 mg at 08/08/22 1000   hydrochlorothiazide (HYDRODIURIL) tablet 12.5 mg  12.5 mg Oral Daily Nolberto Hanlon, MD   12.5 mg at 08/08/22 1000   insulin aspart (novoLOG) injection 0-15 Units  0-15 Units Subcutaneous TID WC Nolberto Hanlon, MD       insulin aspart (novoLOG) injection 0-5 Units  0-5 Units Subcutaneous QHS Nolberto Hanlon, MD   2 Units at 08/07/22 2303   lisinopril (ZESTRIL) tablet 10 mg  10 mg Oral Daily Nolberto Hanlon, MD   10 mg at 08/08/22 1001   magnesium oxide (MAG-OX) tablet 400 mg  400 mg Oral Daily Nolberto Hanlon, MD   400 mg at 08/08/22 1001   piperacillin-tazobactam (ZOSYN) IVPB 3.375 g  3.375 g Intravenous Tommie Raymond, MD 12.5 mL/hr at 08/08/22 0657 3.375 g at 08/08/22 0657   polyethylene glycol (MIRALAX / GLYCOLAX) packet 17 g  17 g Oral Daily PRN Nolberto Hanlon, MD       sodium chloride flush (NS) 0.9 % injection 3 mL  3 mL Intravenous Q12H Nolberto Hanlon, MD   3 mL at 08/07/22 2332   vancomycin (VANCOCIN) IVPB 1000 mg/200 mL premix  1,000 mg Intravenous Q24H Nolberto Hanlon, MD       Melene Muller ON 08/14/2022] Vitamin D (Ergocalciferol) (DRISDOL) 1.25 MG (50000 UNIT) capsule 50,000 Units  50,000 Units Oral Q7 days Nolberto Hanlon, MD         Discharge Medications: Please see discharge summary for a list of discharge medications.  Relevant Imaging Results:  Relevant Lab Results:   Additional Information SS #: [id number]  Brienne Liguori E Zadin Lange, LCSW

## 2022-08-08 NOTE — TOC Initial Note (Signed)
Transition of Care [facility]) - Initial/Assessment Note    Patient Details  Name: [name] MRN: [id number] Date of Birth: 1933/10/21  [facility] CM/SW Contact:    Liliana Cline, LCSW Phone Number: 08/08/2022, 10:11 AM  Clinical Narrative:                 Per [facility], patient is from [facility] long term [facility] and can return when medically ready.  Expected Discharge Plan: Skilled Nursing Facility Barriers to Discharge: Continued Medical Work up   Patient Goals and CMS Choice            Expected Discharge Plan and Services       Living arrangements for the past 2 months: Skilled Nursing Facility                                      Prior Living Arrangements/Services Living arrangements for the past 2 months: Skilled Nursing Facility Lives with:: Facility Resident                   Activities of Daily Living      Permission Sought/Granted                  Emotional Assessment              Admission diagnosis:  Diabetic foot infection (HCC) [W09.811, L08.9] Osteomyelitis of left foot, unspecified type (HCC) [M86.9] Patient Active Problem List   Diagnosis Date Noted   Diabetic foot infection (HCC) 08/07/2022   Hemiplegia affecting left side in left-dominant patient as late effect of cerebrovascular disease (HCC) 01/10/2022   Vaginal candidiasis 01/10/2022   Leg wound, left 01/08/2022   Hypoglycemia 01/07/2022   Leukocytosis 01/07/2022   MRI contraindicated due to metal implant 01/07/2022   Open wound of left foot 01/07/2022   Atherosclerosis of native arteries of the extremities with ulceration (HCC) 06/06/2021   Pressure injury of skin 06/01/2021   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   PAD (peripheral artery disease) (HCC) 05/17/2021   Right hemiparesis (HCC) 05/16/2021   Left leg cellulitis 05/15/2021   Hypokalemia 05/15/2021   CVA (cerebral vascular accident) (HCC) 11/14/2019   History of stroke 10/07/2019    Altered mental status 10/06/2019   Carotid artery disease (HCC) 09/28/2019   Left hemiparesis (HCC) 09/28/2019   UTI (urinary tract infection) 09/20/2019   Sepsis (HCC) 03/22/2018   Primary osteoarthritis of both hips 11/06/2017   Microalbuminuria 11/02/2017   Cystocele, midline 09/04/2017   Incomplete uterine prolapse 09/04/2017   Type 2 diabetes mellitus with microalbuminuria, without long-term current use of insulin (HCC) 07/27/2017   Ankle edema 04/27/2017   Pure hypercholesterolemia 03/30/2016   Malnutrition of moderate degree 01/15/2016   Dehydration    Incarcerated ventral hernia    Diabetic ketoacidosis without coma associated with type 2 diabetes mellitus (HCC)    Type 2 diabetes mellitus with polyneuropathy (HCC) 08/06/2015   Carpal tunnel syndrome, bilateral 07/10/2015   Osteopenia of multiple sites 07/10/2015   Arthralgia of multiple sites 06/27/2015   Mixed hyperlipidemia 11/06/2014   Hypomagnesemia 11/02/2013   Essential hypertension 11/02/2013   PCP:  Gracelyn Nurse, MD Pharmacy:   [facility] - [place], [place] - [address] [place] [address] Phone: [phone] Fax: [phone]  [id number] [place], [place] - [address] AT [facility] OF [address] [place] [address] [place] [address] Phone: [phone] Fax: [phone]     Social Determinants of Health (SDOH) Social History: SDOH Screenings   Tobacco Use: Low Risk  (01/09/2022)   SDOH Interventions:     Readmission Risk Interventions     No data to display

## 2022-08-09 DIAGNOSIS — I10 Essential (primary) hypertension: Secondary | ICD-10-CM

## 2022-08-09 DIAGNOSIS — E11628 Type 2 diabetes mellitus with other skin complications: Secondary | ICD-10-CM | POA: Diagnosis not present

## 2022-08-09 DIAGNOSIS — M86172 Other acute osteomyelitis, left ankle and foot: Secondary | ICD-10-CM | POA: Diagnosis not present

## 2022-08-09 DIAGNOSIS — L089 Local infection of the skin and subcutaneous tissue, unspecified: Secondary | ICD-10-CM | POA: Diagnosis not present

## 2022-08-09 LAB — CULTURE, BLOOD (SINGLE)

## 2022-08-09 LAB — GLUCOSE, CAPILLARY
Glucose-Capillary: 77 mg/dL (ref 70–99)
Glucose-Capillary: 138 mg/dL — ABNORMAL HIGH (ref 70–99)
Glucose-Capillary: 224 mg/dL — ABNORMAL HIGH (ref 70–99)
Glucose-Capillary: 147 mg/dL — ABNORMAL HIGH (ref 70–99)

## 2022-08-09 LAB — CREATININE, SERUM
Creatinine, Ser: 0.59 mg/dL (ref 0.44–1.00)
GFR, Estimated: >60 mL/min (ref >60)

## 2022-08-09 LAB — HEMOGLOBIN A1C
Hgb A1c MFr Bld: 8.1 % — ABNORMAL HIGH (ref 4.8–5.6)
Mean Plasma Glucose: 186 mg/dL

## 2022-08-09 MED ORDER — SENNOSIDES-DOCUSATE SODIUM 8.6-50 MG PO TABS
2.0000 | ORAL_TABLET | Freq: Two times a day (BID) | ORAL | Status: DC
  Administered 2022-08-09 – 2022-08-21 (×20): 2 via ORAL
  Filled 2022-08-09 (×21): qty 2

## 2022-08-09 NOTE — Progress Notes (Addendum)
  Progress Note   Patient: [name] YNW:[id number] DOB: 1933-04-25 DOA: 08/07/2022     2 DOS: the patient was seen and examined on 08/09/2022   Brief hospital course: [name] is a 89 y.o. female with medical history significant of unspecified dementia, type 2 diabetes, diabetic polyneuropathy, essential hypertension and dyslipidemia who was sent over by wound care for worsening left foot ulcer with infection. CT scan showed significant cellulitis and concern for fifth metatarsal osteomyelitis.  Patient was started on Zosyn and vancomycin. Patient is evaluated by vascular surgery and podiatry.      Principal Problem:   Diabetic foot infection (HCC) Active Problems:   Type 2 diabetes mellitus with polyneuropathy (HCC)   Mixed hyperlipidemia   Essential hypertension   Malnutrition of moderate degree   Left hemiparesis (HCC)   Cellulitis of left foot   Right hemiparesis (HCC)   PAD (peripheral artery disease) (HCC)   Hemiplegia affecting left side in left-dominant patient as late effect of cerebrovascular disease (HCC)   Acute osteomyelitis of left foot (HCC)   Assessment and Plan: Diabetic foot infection. Left foot cellulitis with osteomyelitis. Peripheral arterial disease. CT scan with contrast showed evidence of osteomyelitis, could not get MRI due to metal object in the eye. Patient did not meet sepsis criteria at time of admission, continue current antibiotic with Zosyn and vancomycin.   Patient has been evaluated by vascular surgery and podiatry, revascularization will be attempted, followed by debridement versus amputation.  History of stroke with left hemiparesis. Dementia without behavioral disturbance. Continue home medicines.   Essential hypertension, Dyslipidemia. Due to anticipated vascular procedure with contrast, discontinued lisinopril and HCTZ.   Type 2 diabetes with polyneuropathy. Continue sliding scale insulin.      Subjective:   Patient doing well today, still complains some pain in the foot.  Otherwise no short of breath or cough.  No nausea vomiting.  Physical Exam: Vitals:   08/07/22 2210 08/08/22 0808 08/08/22 2342 08/09/22 0727  BP: 139/64 (!) 145/69 (!) 143/75 (!) 157/70  Pulse: 88 81 94 78  Resp: 18 16 18 17   Temp: 97.8 F (36.6 C) 98 F (36.7 C) 98.6 F (37 C) 97.8 F (36.6 C)  TempSrc:      SpO2: 99% 97% 100% 100%  Weight:      Height:       General exam: Appears calm and comfortable  Respiratory system: Clear to auscultation. Respiratory effort normal. Cardiovascular system: S1 & S2 heard, RRR. No JVD, murmurs, rubs, gallops or clicks. No pedal edema. Gastrointestinal system: Abdomen is nondistended, soft and nontender. No organomegaly or masses felt. Normal bowel sounds heard. Central nervous system: Alert and oriented x2.  Left hemiparesis. Extremities: Symmetric 5 x 5 power. Skin: No rashes, lesions or ulcers Psychiatry: Judgement and insight appear normal. Mood & affect appropriate.    Data Reviewed:    Family Communication: son updated.   Disposition: Status is: Inpatient Remains inpatient appropriate because: Severity of disease, IV treatment, inpatient procedure.     Time spent: 35 minutes  Author: Marrion Coy, MD 08/09/2022 9:31 AM  For on call review [email or web address].

## 2022-08-09 NOTE — Progress Notes (Signed)
Patient is alert to person only. She is restless in bed. Thought she was at a church and was reoriented. Asked if she had pain but she denied.

## 2022-08-10 DIAGNOSIS — I10 Essential (primary) hypertension: Secondary | ICD-10-CM | POA: Diagnosis not present

## 2022-08-10 DIAGNOSIS — M86172 Other acute osteomyelitis, left ankle and foot: Secondary | ICD-10-CM | POA: Diagnosis not present

## 2022-08-10 DIAGNOSIS — E11628 Type 2 diabetes mellitus with other skin complications: Secondary | ICD-10-CM | POA: Diagnosis not present

## 2022-08-10 DIAGNOSIS — I739 Peripheral vascular disease, unspecified: Secondary | ICD-10-CM | POA: Diagnosis not present

## 2022-08-10 LAB — CBC
HCT: 27.9 % — ABNORMAL LOW (ref 36.0–46.0)
Hemoglobin: 8.9 g/dL — ABNORMAL LOW (ref 12.0–15.0)
MCH: 30.5 pg (ref 26.0–34.0)
MCHC: 31.9 g/dL (ref 30.0–36.0)
MCV: 95.5 fL (ref 80.0–100.0)
Platelets: 295 10*3/uL (ref 150–400)
RBC: 2.92 MIL/uL — ABNORMAL LOW (ref 3.87–5.11)
RDW: 12.7 % (ref 11.5–15.5)
WBC: 9.4 10*3/uL (ref 4.0–10.5)
nRBC: 0 % (ref 0.0–0.2)

## 2022-08-10 LAB — MAGNESIUM: Magnesium: 1.9 mg/dL (ref 1.7–2.4)

## 2022-08-10 LAB — BASIC METABOLIC PANEL
Anion gap: 10 (ref 5–15)
BUN: 19 mg/dL (ref 8–23)
CO2: 26 mmol/L (ref 22–32)
Calcium: 8.7 mg/dL — ABNORMAL LOW (ref 8.9–10.3)
Chloride: 101 mmol/L (ref 98–111)
Creatinine, Ser: 0.57 mg/dL (ref 0.44–1.00)
GFR, Estimated: >60 mL/min (ref >60)
Glucose, Bld: 131 mg/dL — ABNORMAL HIGH (ref 70–99)
Potassium: 3.8 mmol/L (ref 3.5–5.1)
Sodium: 137 mmol/L (ref 135–145)

## 2022-08-10 LAB — GLUCOSE, CAPILLARY
Glucose-Capillary: 115 mg/dL — ABNORMAL HIGH (ref 70–99)
Glucose-Capillary: 172 mg/dL — ABNORMAL HIGH (ref 70–99)
Glucose-Capillary: 134 mg/dL — ABNORMAL HIGH (ref 70–99)
Glucose-Capillary: 186 mg/dL — ABNORMAL HIGH (ref 70–99)

## 2022-08-10 LAB — CULTURE, BLOOD (SINGLE): Special Requests: ADEQUATE

## 2022-08-10 MED ORDER — LACTULOSE 10 GM/15ML PO SOLN
20.0000 g | Freq: Once | ORAL | Status: AC
  Administered 2022-08-10: 20 g via ORAL
  Filled 2022-08-10: qty 30

## 2022-08-10 NOTE — Progress Notes (Signed)
  Progress Note   Patient: [name] ZOX:[id number] DOB: Jan 28, 1934 DOA: 08/07/2022     3 DOS: the patient was seen and examined on 08/10/2022   Brief hospital course: TYSHIA BEITER is a 89 y.o. female with medical history significant of unspecified dementia, type 2 diabetes, diabetic polyneuropathy, essential hypertension and dyslipidemia who was sent over by wound care for worsening left foot ulcer with infection. CT scan showed significant cellulitis and concern for fifth metatarsal osteomyelitis.  Patient was started on Zosyn and vancomycin. Patient is evaluated by vascular surgery and podiatry.      Principal Problem:   Diabetic foot infection (HCC) Active Problems:   Type 2 diabetes mellitus with polyneuropathy (HCC)   Mixed hyperlipidemia   Essential hypertension   Malnutrition of moderate degree   Left hemiparesis (HCC)   Cellulitis of left foot   Right hemiparesis (HCC)   PAD (peripheral artery disease) (HCC)   Hemiplegia affecting left side in left-dominant patient as late effect of cerebrovascular disease (HCC)   Acute osteomyelitis of left foot (HCC)   Assessment and Plan: Diabetic foot infection. Left foot cellulitis with osteomyelitis. Peripheral arterial disease. CT scan with contrast showed evidence of osteomyelitis, could not get MRI due to metal object in the eye. Patient did not meet sepsis criteria at time of admission, continue current antibiotic with Zosyn and vancomycin.   Patient has been evaluated by vascular surgery and podiatry, revascularization will be attempted, followed by debridement versus amputation. Surgery scheduled tomorrow, continue antibiotics.  History of stroke with left hemiparesis. Dementia without behavioral disturbance. Continue home medicines.   Essential hypertension, Dyslipidemia. Due to anticipated vascular procedure with contrast, discontinued lisinopril and HCTZ.   Type 2 diabetes with polyneuropathy. Continue  sliding scale insulin.  Anemia. Normal iron level, B12 level borderline at 350, started on B12, check homocystine level.    Subjective:  Patient complains of constipation, lactulose given.  Physical Exam: Vitals:   08/09/22 0727 08/09/22 1505 08/09/22 2326 08/10/22 0920  BP: (!) 157/70 (!) 160/69 (!) 138/59 (!) 117/58  Pulse: 78 71 73 77  Resp: 17 17 18 16   Temp: 97.8 F (36.6 C) 97.8 F (36.6 C) 98.2 F (36.8 C) 97.8 F (36.6 C)  TempSrc:      SpO2: 100% 100% 99% 99%  Weight:      Height:       General exam: Appears calm and comfortable  Respiratory system: Clear to auscultation. Respiratory effort normal. Cardiovascular system: S1 & S2 heard, RRR. No JVD, murmurs, rubs, gallops or clicks. No pedal edema. Gastrointestinal system: Abdomen is nondistended, soft and nontender. No organomegaly or masses felt. Normal bowel sounds heard. Central nervous system: Alert and oriented. No focal neurological deficits. Extremities: Symmetric 5 x 5 power. Skin: No rashes, lesions or ulcers Psychiatry: Judgement and insight appear normal. Mood & affect appropriate.    Data Reviewed:  Lab results reviewed.  Family Communication: None  Disposition: Status is: Inpatient Remains inpatient appropriate because: Severity of disease, IV treatment, inpatient procedures.     Time spent: 35 minutes  Author: Marrion Coy, MD 08/10/2022 11:24 AM  For on call review [email or web address].

## 2022-08-10 NOTE — Plan of Care (Signed)
  Problem: Education: Goal: Ability to describe self-care measures that may prevent or decrease complications (Diabetes Survival Skills Education) will improve Outcome: Progressing Goal: Individualized Educational Video(s) Outcome: Progressing   

## 2022-08-10 NOTE — H&P (View-Only) (Signed)
   Subjective/Chief Complaint: Denies pain. Dementia.   Objective: Vital signs in last 24 hours: Temp:  [97.8 F (36.6 C)-98.2 F (36.8 C)] 98.2 F (36.8 C) (06/29 2326) Pulse Rate:  [71-78] 73 (06/29 2326) Resp:  [17-18] 18 (06/29 2326) BP: (138-160)/(59-70) 138/59 (06/29 2326) SpO2:  [99 %-100 %] 99 % (06/29 2326) Last BM Date : 08/06/22  Intake/Output from previous day: 06/29 0701 - 06/30 0700 In: 924.6 [P.O.:240; IV Piggyback:684.6] Out: 1400 [Urine:1400] Intake/Output this shift: No intake/output data recorded.  General appearance: no distress Cardio: regular rate and rhythm Extremities: LEFT lower extremity- warm, dressing in place  Lab Results:  Recent Labs    08/08/22 0338 08/10/22 0422  WBC 10.1 9.4  HGB 9.1* 8.9*  HCT 29.4* 27.9*  PLT 316 295   BMET Recent Labs    08/07/22 1605 08/08/22 0338 08/09/22 0401 08/10/22 0422  NA 136  --   --  137  K 4.2  --   --  3.8  CL 97*  --   --  101  CO2 27  --   --  26  GLUCOSE 344*  --   --  131*  BUN 35*  --   --  19  CREATININE 0.58   < > 0.59 0.57  CALCIUM 9.1  --   --  8.7*   < > = values in this interval not displayed.   PT/INR Recent Labs    08/08/22 0338  LABPROT 15.3*  INR 1.2   ABG No results for input(s): "PHART", "HCO3" in the last 72 hours.  Invalid input(s): "PCO2", "PO2"  Studies/Results: No results found.  Anti-infectives: Anti-infectives (From admission, onward)    Start     Dose/Rate Route Frequency Ordered Stop   08/08/22 1900  vancomycin (VANCOCIN) IVPB 1000 mg/200 mL premix        1,000 mg 200 mL/hr over 60 Minutes Intravenous Every 24 hours 08/07/22 1918     08/07/22 2200  piperacillin-tazobactam (ZOSYN) IVPB 3.375 g        3.375 g 12.5 mL/hr over 240 Minutes Intravenous Every 8 hours 08/07/22 1918     08/07/22 1715  vancomycin (VANCOCIN) IVPB 1000 mg/200 mL premix  Status:  Discontinued        1,000 mg 200 mL/hr over 60 Minutes Intravenous  Once 08/07/22 1700  08/07/22 1701   08/07/22 1715  cefTRIAXone (ROCEPHIN) 2 g in sodium chloride 0.9 % 100 mL IVPB        2 g 200 mL/hr over 30 Minutes Intravenous  Once 08/07/22 1700 08/07/22 1840   08/07/22 1715  vancomycin (VANCOREADY) IVPB 1500 mg/300 mL        1,500 mg 150 mL/hr over 120 Minutes Intravenous  Once 08/07/22 1701 08/08/22 1635       Assessment/Plan: LEFT Foot Osteomyelitis.  Plan for LEFT lower extremity angiogram, possible intervention tomorrow per Dr. Gilda Crease NPO after MN  Consent from POA  LOS: 3 days    Eli Hose A 08/10/2022

## 2022-08-10 NOTE — Progress Notes (Signed)
   Subjective/Chief Complaint: Denies pain. Dementia.   Objective: Vital signs in last 24 hours: Temp:  [97.8 F (36.6 C)-98.2 F (36.8 C)] 98.2 F (36.8 C) (06/29 2326) Pulse Rate:  [71-78] 73 (06/29 2326) Resp:  [17-18] 18 (06/29 2326) BP: (138-160)/(59-70) 138/59 (06/29 2326) SpO2:  [99 %-100 %] 99 % (06/29 2326) Last BM Date : 08/06/22  Intake/Output from previous day: 06/29 0701 - 06/30 0700 In: 924.6 [P.O.:240; IV Piggyback:684.6] Out: 1400 [Urine:1400] Intake/Output this shift: No intake/output data recorded.  General appearance: no distress Cardio: regular rate and rhythm Extremities: LEFT lower extremity- warm, dressing in place  Lab Results:  Recent Labs    08/08/22 0338 08/10/22 0422  WBC 10.1 9.4  HGB 9.1* 8.9*  HCT 29.4* 27.9*  PLT 316 295   BMET Recent Labs    08/07/22 1605 08/08/22 0338 08/09/22 0401 08/10/22 0422  NA 136  --   --  137  K 4.2  --   --  3.8  CL 97*  --   --  101  CO2 27  --   --  26  GLUCOSE 344*  --   --  131*  BUN 35*  --   --  19  CREATININE 0.58   < > 0.59 0.57  CALCIUM 9.1  --   --  8.7*   < > = values in this interval not displayed.   PT/INR Recent Labs    08/08/22 0338  LABPROT 15.3*  INR 1.2   ABG No results for input(s): "PHART", "HCO3" in the last 72 hours.  Invalid input(s): "PCO2", "PO2"  Studies/Results: No results found.  Anti-infectives: Anti-infectives (From admission, onward)    Start     Dose/Rate Route Frequency Ordered Stop   08/08/22 1900  vancomycin (VANCOCIN) IVPB 1000 mg/200 mL premix        1,000 mg 200 mL/hr over 60 Minutes Intravenous Every 24 hours 08/07/22 1918     08/07/22 2200  piperacillin-tazobactam (ZOSYN) IVPB 3.375 g        3.375 g 12.5 mL/hr over 240 Minutes Intravenous Every 8 hours 08/07/22 1918     08/07/22 1715  vancomycin (VANCOCIN) IVPB 1000 mg/200 mL premix  Status:  Discontinued        1,000 mg 200 mL/hr over 60 Minutes Intravenous  Once 08/07/22 1700  08/07/22 1701   08/07/22 1715  cefTRIAXone (ROCEPHIN) 2 g in sodium chloride 0.9 % 100 mL IVPB        2 g 200 mL/hr over 30 Minutes Intravenous  Once 08/07/22 1700 08/07/22 1840   08/07/22 1715  vancomycin (VANCOREADY) IVPB 1500 mg/300 mL        1,500 mg 150 mL/hr over 120 Minutes Intravenous  Once 08/07/22 1701 08/08/22 1635       Assessment/Plan: LEFT Foot Osteomyelitis.  Plan for LEFT lower extremity angiogram, possible intervention tomorrow per Dr. Schnier NPO after MN  Consent from POA  LOS: 3 days    Lilianna Case A 08/10/2022  

## 2022-08-11 ENCOUNTER — Encounter
Admission: EM | Disposition: A | Discharge status: Skilled Nursing Facility | Payer: Self-pay | Source: Skilled Nursing Facility | Attending: Internal Medicine

## 2022-08-11 DIAGNOSIS — I700 Atherosclerosis of aorta: Secondary | ICD-10-CM | POA: Diagnosis not present

## 2022-08-11 DIAGNOSIS — E11628 Type 2 diabetes mellitus with other skin complications: Secondary | ICD-10-CM | POA: Diagnosis not present

## 2022-08-11 DIAGNOSIS — I69952 Hemiplegia and hemiparesis following unspecified cerebrovascular disease affecting left dominant side: Secondary | ICD-10-CM | POA: Diagnosis not present

## 2022-08-11 DIAGNOSIS — I70245 Atherosclerosis of native arteries of left leg with ulceration of other part of foot: Secondary | ICD-10-CM | POA: Diagnosis not present

## 2022-08-11 DIAGNOSIS — M86172 Other acute osteomyelitis, left ankle and foot: Secondary | ICD-10-CM | POA: Diagnosis not present

## 2022-08-11 DIAGNOSIS — L089 Local infection of the skin and subcutaneous tissue, unspecified: Secondary | ICD-10-CM | POA: Diagnosis not present

## 2022-08-11 DIAGNOSIS — L97529 Non-pressure chronic ulcer of other part of left foot with unspecified severity: Secondary | ICD-10-CM | POA: Diagnosis not present

## 2022-08-11 HISTORY — PX: LOWER EXTREMITY ANGIOGRAPHY: CATH118251

## 2022-08-11 LAB — CBC
HCT: 32.1 % — ABNORMAL LOW (ref 36.0–46.0)
Hemoglobin: 10 g/dL — ABNORMAL LOW (ref 12.0–15.0)
MCH: 29.9 pg (ref 26.0–34.0)
MCHC: 31.2 g/dL (ref 30.0–36.0)
MCV: 96.1 fL (ref 80.0–100.0)
Platelets: 302 10*3/uL (ref 150–400)
RBC: 3.34 MIL/uL — ABNORMAL LOW (ref 3.87–5.11)
RDW: 12.8 % (ref 11.5–15.5)
WBC: 11.3 10*3/uL — ABNORMAL HIGH (ref 4.0–10.5)
nRBC: 0 % (ref 0.0–0.2)

## 2022-08-11 LAB — GLUCOSE, CAPILLARY
Glucose-Capillary: 141 mg/dL — ABNORMAL HIGH (ref 70–99)
Glucose-Capillary: 131 mg/dL — ABNORMAL HIGH (ref 70–99)
Glucose-Capillary: 46 mg/dL — ABNORMAL LOW (ref 70–99)
Glucose-Capillary: 55 mg/dL — ABNORMAL LOW (ref 70–99)
Glucose-Capillary: 113 mg/dL — ABNORMAL HIGH (ref 70–99)
Glucose-Capillary: 186 mg/dL — ABNORMAL HIGH (ref 70–99)

## 2022-08-11 LAB — CULTURE, BLOOD (SINGLE): Culture: NO GROWTH

## 2022-08-11 LAB — HOMOCYSTEINE: Homocysteine: 17.3 umol/L (ref 0.0–21.3)

## 2022-08-11 LAB — VANCOMYCIN, PEAK: Vancomycin Pk: 10 ug/mL — ABNORMAL LOW (ref 30–40)

## 2022-08-11 SURGERY — LOWER EXTREMITY ANGIOGRAPHY
Anesthesia: Moderate Sedation | Laterality: Left

## 2022-08-11 MED ORDER — FENTANYL CITRATE PF 50 MCG/ML IJ SOSY: 12.5000 ug | PREFILLED_SYRINGE | Freq: Once | INTRAMUSCULAR | Status: DC | PRN

## 2022-08-11 MED ORDER — DEXTROSE 50 % IV SOLN
INTRAVENOUS | Status: AC
  Filled 2022-08-11: qty 50

## 2022-08-11 MED ORDER — CEFAZOLIN SODIUM-DEXTROSE 2-4 GM/100ML-% IV SOLN: 2.0000 g | INTRAVENOUS | Status: DC

## 2022-08-11 MED ORDER — MIDAZOLAM HCL 2 MG/2ML IJ SOLN
INTRAMUSCULAR | Status: DC | PRN
  Administered 2022-08-11 (×2): 1 mg via INTRAVENOUS

## 2022-08-11 MED ORDER — CEFAZOLIN SODIUM-DEXTROSE 2-4 GM/100ML-% IV SOLN
INTRAVENOUS | Status: AC
  Filled 2022-08-11: qty 100

## 2022-08-11 MED ORDER — FENTANYL CITRATE (PF) 100 MCG/2ML IJ SOLN
INTRAMUSCULAR | Status: AC
  Filled 2022-08-11: qty 2

## 2022-08-11 MED ORDER — LIDOCAINE HCL (PF) 1 % IJ SOLN
INTRAMUSCULAR | Status: DC | PRN
  Administered 2022-08-11: 10 mL via INTRADERMAL

## 2022-08-11 MED ORDER — SODIUM CHLORIDE 0.9 % IV SOLN: INTRAVENOUS | Status: DC

## 2022-08-11 MED ORDER — ACETAMINOPHEN 325 MG PO TABS: 650.0000 mg | ORAL_TABLET | ORAL | Status: DC | PRN

## 2022-08-11 MED ORDER — MIDAZOLAM HCL 2 MG/ML PO SYRP: 8.0000 mg | ORAL_SOLUTION | Freq: Once | ORAL | Status: DC | PRN

## 2022-08-11 MED ORDER — LABETALOL HCL 5 MG/ML IV SOLN: 10.0000 mg | INTRAVENOUS | Status: DC | PRN

## 2022-08-11 MED ORDER — METHYLPREDNISOLONE SODIUM SUCC 125 MG IJ SOLR: 125.0000 mg | Freq: Once | INTRAMUSCULAR | Status: DC | PRN

## 2022-08-11 MED ORDER — OXYCODONE HCL 5 MG PO TABS
5.0000 mg | ORAL_TABLET | ORAL | Status: DC | PRN
  Administered 2022-08-13: 5 mg via ORAL
  Administered 2022-08-14: 10 mg via ORAL
  Filled 2022-08-11 (×2): qty 2
  Filled 2022-08-11: qty 1

## 2022-08-11 MED ORDER — HEPARIN SODIUM (PORCINE) 1000 UNIT/ML IJ SOLN
INTRAMUSCULAR | Status: DC | PRN
  Administered 2022-08-11: 6000 [IU] via INTRAVENOUS

## 2022-08-11 MED ORDER — HEPARIN (PORCINE) IN NACL 1000-0.9 UT/500ML-% IV SOLN
INTRAVENOUS | Status: DC | PRN
  Administered 2022-08-11: 1000 mL

## 2022-08-11 MED ORDER — MIDAZOLAM HCL 2 MG/2ML IJ SOLN
INTRAMUSCULAR | Status: AC
  Filled 2022-08-11: qty 4

## 2022-08-11 MED ORDER — FENTANYL CITRATE (PF) 100 MCG/2ML IJ SOLN
INTRAMUSCULAR | Status: DC | PRN
  Administered 2022-08-11 (×3): 25 ug via INTRAVENOUS

## 2022-08-11 MED ORDER — HEPARIN SODIUM (PORCINE) 1000 UNIT/ML IJ SOLN
INTRAMUSCULAR | Status: AC
  Filled 2022-08-11: qty 10

## 2022-08-11 MED ORDER — DIPHENHYDRAMINE HCL 50 MG/ML IJ SOLN: 50.0000 mg | Freq: Once | INTRAMUSCULAR | Status: DC | PRN

## 2022-08-11 MED ORDER — SODIUM CHLORIDE 0.9% FLUSH
3.0000 mL | INTRAVENOUS | Status: DC | PRN
  Administered 2022-08-15: 3 mL via INTRAVENOUS

## 2022-08-11 MED ORDER — HYDRALAZINE HCL 20 MG/ML IJ SOLN
5.0000 mg | INTRAMUSCULAR | Status: DC | PRN
  Administered 2022-08-20: 5 mg via INTRAVENOUS
  Filled 2022-08-11: qty 1

## 2022-08-11 MED ORDER — SODIUM CHLORIDE 0.9% FLUSH
3.0000 mL | Freq: Two times a day (BID) | INTRAVENOUS | Status: DC
  Administered 2022-08-12 – 2022-08-21 (×13): 3 mL via INTRAVENOUS

## 2022-08-11 MED ORDER — MORPHINE SULFATE (PF) 4 MG/ML IV SOLN
2.0000 mg | INTRAVENOUS | Status: DC | PRN
  Filled 2022-08-11: qty 1

## 2022-08-11 MED ORDER — ONDANSETRON HCL 4 MG/2ML IJ SOLN: 4.0000 mg | Freq: Four times a day (QID) | INTRAMUSCULAR | Status: DC | PRN

## 2022-08-11 MED ORDER — HYDROMORPHONE HCL 1 MG/ML IJ SOLN: 1.0000 mg | Freq: Once | INTRAMUSCULAR | Status: DC | PRN

## 2022-08-11 MED ORDER — DEXTROSE 50 % IV SOLN
50.0000 mL | Freq: Once | INTRAVENOUS | Status: AC
  Administered 2022-08-11: 50 mL via INTRAVENOUS

## 2022-08-11 MED ORDER — LABETALOL HCL 5 MG/ML IV SOLN
INTRAVENOUS | Status: DC | PRN
  Administered 2022-08-11: 10 mg via INTRAVENOUS

## 2022-08-11 MED ORDER — SODIUM CHLORIDE 0.9 % IV SOLN: 250.0000 mL | INTRAVENOUS | Status: DC | PRN

## 2022-08-11 MED ORDER — LABETALOL HCL 5 MG/ML IV SOLN
INTRAVENOUS | Status: AC
  Filled 2022-08-11: qty 4

## 2022-08-11 MED ORDER — FAMOTIDINE 20 MG PO TABS: 40.0000 mg | ORAL_TABLET | Freq: Once | ORAL | Status: DC | PRN

## 2022-08-11 MED ORDER — HYDRALAZINE HCL 20 MG/ML IJ SOLN
INTRAMUSCULAR | Status: AC
  Filled 2022-08-11: qty 1

## 2022-08-11 SURGICAL SUPPLY — 26 items
BALLN LUTONIX 018 4X220X130 (BALLOONS) ×1
BALLN LUTONIX 018 5X80X130 (BALLOONS) ×1
BALLN LUTONIX 018 6X220X130 (BALLOONS) ×1
BALLN ULTRASCORE 014 3X200X150 (BALLOONS) ×1
BALLOON LUTONIX 018 4X220X130 (BALLOONS) IMPLANT
BALLOON LUTONIX 018 5X80X130 (BALLOONS) IMPLANT
BALLOON LUTONIX 018 6X220X130 (BALLOONS) IMPLANT
BALLOON ULTRSCRE 014 3X200X150 (BALLOONS) IMPLANT
CATH ANGIO 5F PIGTAIL 65CM (CATHETERS) IMPLANT
CATH BEACON 5 .038 100 VERT TP (CATHETERS) IMPLANT
COVER PROBE ULTRASOUND 5X96 (MISCELLANEOUS) IMPLANT
DEVICE STARCLOSE SE CLOSURE (Vascular Products) IMPLANT
GLIDEWIRE ADV .014X300CM (WIRE) IMPLANT
GLIDEWIRE ADV .035X260CM (WIRE) IMPLANT
GOWN STRL REUS W/ TWL LRG LVL3 (GOWN DISPOSABLE) ×1 IMPLANT
GOWN STRL REUS W/TWL LRG LVL3 (GOWN DISPOSABLE) ×1
KIT ENCORE 26 ADVANTAGE (KITS) IMPLANT
NDL ENTRY 21GA 7CM ECHOTIP (NEEDLE) IMPLANT
NEEDLE ENTRY 21GA 7CM ECHOTIP (NEEDLE) ×1 IMPLANT
PACK ANGIOGRAPHY (CUSTOM PROCEDURE TRAY) ×1 IMPLANT
SET INTRO CAPELLA COAXIAL (SET/KITS/TRAYS/PACK) IMPLANT
SHEATH BRITE TIP 5FRX11 (SHEATH) IMPLANT
SHEATH RAABE 6FRX70 (SHEATH) IMPLANT
SYR MEDRAD MARK 7 150ML (SYRINGE) IMPLANT
TUBING CONTRAST HIGH PRESS 72 (TUBING) IMPLANT
WIRE GUIDERIGHT .035X150 (WIRE) IMPLANT

## 2022-08-11 NOTE — Progress Notes (Signed)
OT Cancellation Note  Patient Details Name: [name] MRN: [id number] DOB: Feb 21, 1933   Cancelled Treatment:    Reason Eval/Treat Not Completed: Other (comment). Consult received, chart reviewed. Spoke with RN. Pt pending vascular procedure this afternoon. Will hold OT evaluation at this time and assess following surgery pending on POC.   Arman Filter., MPH, MS, OTR/L ascom [phone] 08/11/22, 1:41 PM

## 2022-08-11 NOTE — Progress Notes (Signed)
PT Cancellation Note  Patient Details Name: [name] MRN: [id number] DOB: 01/26/1934   Cancelled Treatment:    Reason Eval/Treat Not Completed: Other (comment) Per OT who spoke with pt's nurse, pt anticipating vascular procedure this afternoon. Will hold PT evaluation at this time & follow up after sx pending medical plan of care.  Aleda Grana, PT, DPT 08/11/22, 1:43 PM   Sandi [name] 08/11/2022, 1:43 PM

## 2022-08-11 NOTE — Plan of Care (Signed)
Patient A&Ox4, from home, independent in room  

## 2022-08-11 NOTE — Progress Notes (Signed)
Pt cleaned and pad changed

## 2022-08-11 NOTE — Interval H&P Note (Signed)
History and Physical Interval Note:  08/11/2022 5:55 PM  [name]  has presented today for surgery, with the diagnosis of PAD with Open Ulcers.  The various methods of treatment have been discussed with the patient and family. After consideration of risks, benefits and other options for treatment, the patient has consented to  Procedure(s): Lower Extremity Angiography (Left) as a surgical intervention.  The patient's history has been reviewed, patient examined, no change in status, stable for surgery.  I have reviewed the patient's chart and labs.  Questions were answered to the patient's satisfaction.     [name]

## 2022-08-11 NOTE — Op Note (Signed)
[facility]  Percutaneous Study/Intervention Procedural Note   Date of Surgery: 08/11/2022  Surgeon:  Levora Dredge  Pre-operative Diagnosis: Atherosclerotic occlusive disease bilateral lower extremities with left lower extremity rest pain and ulceration.  Post-operative diagnosis:  Same  Procedure(s) Performed:             1.  Introduction catheter into left lower extremity 3rd order catheter placement               2.    Contrast injection left lower extremity for distal runoff             3.  Percutaneous transluminal angioplasty left superficial femoral and popliteal arteries to 6 mm             4.  Percutaneous transluminal angioplasty left tibioperoneal trunk and peroneal to 3 mm             5.  Star close closure right common femoral arteriotomy  Anesthesia: Conscious sedation was administered under my direct supervision by the interventional radiology RN. IV Versed plus fentanyl were utilized. Continuous ECG, pulse oximetry and blood pressure was monitored throughout the entire procedure.  Conscious sedation was for a total of 66.  Sheath: 6 [place] Rabie right common femoral retrograde  Contrast: 50 cc  Fluoroscopy Time: 8.2 minutes  Indications:  [name] presents with increasing pain of the left lower extremity.  She has multiple ulcerations of the forefoot.  This suggests the patient is having limb threatening ischemia. The risks and benefits are reviewed all questions answered patient agrees to proceed.  Procedure:   [name] is a 89 y.o. y.o. female who was identified and appropriate procedural time out was performed.  The patient was then placed supine on the table and prepped and draped in the usual sterile fashion.    Ultrasound was placed in the sterile sleeve and the right groin was evaluated the right common femoral artery was echolucent and pulsatile indicating patency.  Image was recorded for the permanent record and under  real-time visualization a microneedle was inserted into the common femoral artery followed by the microwire and then the micro-sheath.  A J-wire was then advanced through the micro-sheath and a  5 [place] sheath was then inserted over a J-wire. J-wire was then advanced and a 5 French pigtail catheter was positioned at the level of T12.  AP projection of the aorta was then obtained. Pigtail catheter was repositioned to above the bifurcation and a RAO view of the pelvis was obtained.  Subsequently a pigtail catheter with an Advantage wire was used to cross the aortic bifurcation.  The catheter and wire were advanced down into the left distal external iliac artery. Oblique view of the femoral bifurcation was then obtained and subsequently the wire was reintroduced and the pigtail catheter negotiated into the SFA representing third order catheter placement. Distal runoff was then performed.  6000 units of heparin was then given and allowed to circulate for several minutes.  A 80 cm 6 [place] Rabie sheath was advanced up and over the bifurcation and positioned in the mid superficial femoral artery  KMP catheter and advantage Glidewire were then negotiated down into the distal popliteal and then into the peroneal artery. Catheter was then advanced. Hand injection contrast demonstrated the tibial anatomy in further detail.  A 3 mm x 300 mm ultra score balloon was used to angioplasty the peroneal and tibioperoneal trunk.  The inflation was for 1 minute  at 10-12 atm. Follow-up imaging demonstrated patency with less than 10% residual stenosis.  The detector was then positioned proximally.  4 mm x 220 mm Lutonix drug-eluting balloon was advanced across the SFA popliteal and tibioperoneal trunk.  Inflation was to 10 to 12 atm for 1 minute.  Imaging demonstrated patency but the tibioperoneal trunk remained undersized.  A 5 mm x 80 mm Lutonix drug-eluting balloon was advanced into the tibioperoneal trunk extending proximally.   Inflation was to 12 atmospheres for 1 minute.  Next a 6 mm x 220 mm Lutonix drug-eluting balloon was advanced across the distal SFA and popliteal covering the entire length of the previously stented arteries.  Inflation was to 12 atm for 1 minute.  Follow-up imaging demonstrated patency with less than 10% residual stenosis of the distal SFA and entire length of the popliteal extending into the tibioperoneal trunk and peroneal. Distal runoff was noted to be widely patent as noted above.    After review of these images the sheath is pulled into the right external iliac oblique of the common femoral is obtained and a Star close device deployed. There no immediate Complications.  Findings:  The abdominal aorta is opacified with a bolus injection contrast. Renal arteries are single and widely patent without evidence of hemodynamically significant stenosis.  The aorta itself has diffuse disease but no hemodynamically significant lesions. The common and external iliac arteries are widely patent bilaterally.  The left common femoral is widely patent as is the profunda femoris.  The SFA does indeed have a significant stenosis which becomes an occlusion in the mid popliteal.  This greater than 80% stenosis begins at the leading edge of the previously placed stent and the occlusion is within the distal half of the previously placed stents.  The trifurcation is occluded.  The anterior tibial and posterior tibial are nonvisualized throughout the recovery course.  The peroneal demonstrates occlusion at its origin as well as occlusion of the tibioperoneal trunk then in the mid one third and distal one third there are 2 focal greater than 75% stenoses in the peroneal.    Following angioplasty the peroneal is patent with less than 10% residual stenosis, there now is in-line flow and it looks quite nice. Angioplasty of the SFA at Hunter's canal extending through the entire length of the popliteal yields an excellent result  with less than 10% residual stenosis.  Summary: Successful recanalization left lower extremity for limb salvage                           Disposition: Patient was taken to the recovery room in stable condition having tolerated the procedure well.  Earl Lites Anglea Gordner 08/11/2022,5:56 PM

## 2022-08-11 NOTE — Progress Notes (Addendum)
  Progress Note   Patient: [name]:[id number] DOB: 19-Jan-1934 DOA: 08/07/2022     4 DOS: the patient was seen and examined on 08/11/2022   Brief hospital course: [name] is a 89 y.o. female with medical history significant of unspecified dementia, type 2 diabetes, diabetic polyneuropathy, essential hypertension and dyslipidemia who was sent over by wound care for worsening left foot ulcer with infection. CT scan showed significant cellulitis and concern for fifth metatarsal osteomyelitis.  Patient was started on Zosyn and vancomycin. Patient is evaluated by vascular surgery and podiatry.      Principal Problem:   Diabetic foot infection (HCC) Active Problems:   Type 2 diabetes mellitus with polyneuropathy (HCC)   Mixed hyperlipidemia   Essential hypertension   Malnutrition of moderate degree   Left hemiparesis (HCC)   Cellulitis of left foot   Right hemiparesis (HCC)   PAD (peripheral artery disease) (HCC)   Hemiplegia affecting left side in left-dominant patient as late effect of cerebrovascular disease (HCC)   Acute osteomyelitis of left foot (HCC)   Assessment and Plan: Diabetic foot infection. Left foot cellulitis with osteomyelitis. Peripheral arterial disease. CT scan with contrast showed evidence of osteomyelitis, could not get MRI due to metal object in the eye. Patient did not meet sepsis criteria at time of admission, continue current antibiotic with Zosyn and vancomycin.   Patient has been evaluated by vascular surgery and podiatry, revascularization will be attempted, followed by debridement versus amputation. Vascular surgery intervention today.  Continue IV antibiotics.   History of stroke with left hemiparesis. Dementia without behavioral disturbance. Continue home medicines.   Essential hypertension, Dyslipidemia. Due to anticipated vascular procedure with contrast, discontinued lisinopril and HCTZ.   Type 2 diabetes with  polyneuropathy. Continue sliding scale insulin.   Anemia. Normal iron level, B12 level borderline at 350, started on B12, homocystine level pending.     Subjective:  Patient has some confusion today, otherwise no complaint.  Physical Exam: Vitals:   08/10/22 0920 08/10/22 1628 08/11/22 0117 08/11/22 0844  BP: (!) 117/58 (!) 130/51 138/63 (!) 154/80  Pulse: 77 83 75 88  Resp: 16 16 18 16   Temp: 97.8 F (36.6 C) 97.8 F (36.6 C) 98.4 F (36.9 C) 97.9 F (36.6 C)  TempSrc:   Axillary   SpO2: 99% 100% 100% 100%  Weight:      Height:       General exam: Appears calm and comfortable  Respiratory system: Clear to auscultation. Respiratory effort normal. Cardiovascular system: S1 & S2 heard, RRR. No JVD, murmurs, rubs, gallops or clicks. No pedal edema. Gastrointestinal system: Abdomen is nondistended, soft and nontender. No organomegaly or masses felt. Normal bowel sounds heard. Central nervous system: Alert and oriented x2.  Left hemiplegia Extremities: Symmetric 5 x 5 power. Skin: No rashes, lesions or ulcers Psychiatry: Mood & affect appropriate.    Data Reviewed:  Results reviewed.  Family Communication: None  Disposition: Status is: Inpatient Remains inpatient appropriate because: Severity of disease, IV treatment, inpatient procedures.     Time spent: 35 minutes  Author: Marrion Coy, MD 08/11/2022 10:18 AM  For on call review [email or web address].

## 2022-08-11 NOTE — Care Management Important Message (Signed)
Important Message  Patient Details  Name: [name] MRN: [id number] Date of Birth: 09-Aug-1933   Medicare Important Message Given:  N/A - LOS <3 / Initial given by admissions     [name] A [name] 08/11/2022, 8:58 AM

## 2022-08-12 ENCOUNTER — Encounter: Payer: Self-pay | Admitting: Vascular Surgery

## 2022-08-12 DIAGNOSIS — L089 Local infection of the skin and subcutaneous tissue, unspecified: Secondary | ICD-10-CM | POA: Diagnosis not present

## 2022-08-12 DIAGNOSIS — Z7189 Other specified counseling: Secondary | ICD-10-CM | POA: Diagnosis not present

## 2022-08-12 DIAGNOSIS — E11628 Type 2 diabetes mellitus with other skin complications: Secondary | ICD-10-CM | POA: Diagnosis not present

## 2022-08-12 DIAGNOSIS — M86172 Other acute osteomyelitis, left ankle and foot: Secondary | ICD-10-CM | POA: Diagnosis not present

## 2022-08-12 DIAGNOSIS — I10 Essential (primary) hypertension: Secondary | ICD-10-CM | POA: Diagnosis not present

## 2022-08-12 DIAGNOSIS — Z515 Encounter for palliative care: Secondary | ICD-10-CM | POA: Diagnosis not present

## 2022-08-12 DIAGNOSIS — L03116 Cellulitis of left lower limb: Secondary | ICD-10-CM | POA: Diagnosis not present

## 2022-08-12 LAB — BASIC METABOLIC PANEL
Anion gap: 7 (ref 5–15)
BUN: 12 mg/dL (ref 8–23)
CO2: 24 mmol/L (ref 22–32)
Calcium: 8.2 mg/dL — ABNORMAL LOW (ref 8.9–10.3)
Chloride: 106 mmol/L (ref 98–111)
Creatinine, Ser: 0.65 mg/dL (ref 0.44–1.00)
GFR, Estimated: >60 mL/min (ref >60)
Glucose, Bld: 163 mg/dL — ABNORMAL HIGH (ref 70–99)
Potassium: 3.5 mmol/L (ref 3.5–5.1)
Sodium: 137 mmol/L (ref 135–145)

## 2022-08-12 LAB — GLUCOSE, CAPILLARY
Glucose-Capillary: 115 mg/dL — ABNORMAL HIGH (ref 70–99)
Glucose-Capillary: 195 mg/dL — ABNORMAL HIGH (ref 70–99)
Glucose-Capillary: 226 mg/dL — ABNORMAL HIGH (ref 70–99)
Glucose-Capillary: 178 mg/dL — ABNORMAL HIGH (ref 70–99)

## 2022-08-12 LAB — VANCOMYCIN, TROUGH: Vancomycin Tr: 8 ug/mL — ABNORMAL LOW (ref 15–20)

## 2022-08-12 LAB — CBC
HCT: 27.1 % — ABNORMAL LOW (ref 36.0–46.0)
Hemoglobin: 8.5 g/dL — ABNORMAL LOW (ref 12.0–15.0)
MCH: 30.4 pg (ref 26.0–34.0)
MCHC: 31.4 g/dL (ref 30.0–36.0)
MCV: 96.8 fL (ref 80.0–100.0)
Platelets: 276 10*3/uL (ref 150–400)
RBC: 2.8 MIL/uL — ABNORMAL LOW (ref 3.87–5.11)
RDW: 13.1 % (ref 11.5–15.5)
WBC: 9.8 10*3/uL (ref 4.0–10.5)
nRBC: 0 % (ref 0.0–0.2)

## 2022-08-12 LAB — MAGNESIUM: Magnesium: 1.8 mg/dL (ref 1.7–2.4)

## 2022-08-12 MED ORDER — CEFAZOLIN SODIUM-DEXTROSE 2-4 GM/100ML-% IV SOLN
2.0000 g | INTRAVENOUS | Status: DC
  Filled 2022-08-12: qty 100

## 2022-08-12 MED ORDER — VANCOMYCIN HCL 2000 MG/400ML IV SOLN: 2000.0000 mg | INTRAVENOUS | Status: DC

## 2022-08-12 MED ORDER — VANCOMYCIN HCL IN DEXTROSE 1-5 GM/200ML-% IV SOLN
1000.0000 mg | Freq: Once | INTRAVENOUS | Status: AC
  Administered 2022-08-12: 1000 mg via INTRAVENOUS
  Filled 2022-08-12: qty 200

## 2022-08-12 MED ORDER — POVIDONE-IODINE 10 % EX SWAB: 2.0000 | Freq: Once | CUTANEOUS | Status: DC

## 2022-08-12 MED ORDER — CHLORHEXIDINE GLUCONATE 4 % EX SOLN: 60.0000 mL | Freq: Once | CUTANEOUS | Status: DC

## 2022-08-12 NOTE — Inpatient Diabetes Management (Signed)
Inpatient Diabetes Program Recommendations  AACE/ADA: New Consensus Statement on Inpatient Glycemic Control (2015)  Target Ranges:  Prepandial:   less than 140 mg/dL      [facility] postprandial:   less than 180 mg/dL (1-2 hours)      Critically ill patients:  140 - 180 mg/dL    Latest Reference Range & Units 08/11/22 08:11 08/11/22 11:30 08/11/22 15:59 08/11/22 16:03 08/11/22 17:59 08/11/22 21:49  Glucose-Capillary 70 - 99 mg/dL 528 (H)  2 units Novolog  131 (H)  2 units Novolog  46 (L) 55 (L) 113 (H) 186 (H)  (H): Data is abnormally high (L): Data is abnormally low  Latest Reference Range & Units 08/12/22 07:37  Glucose-Capillary 70 - 99 mg/dL 413 (H)  (H): Data is abnormally high      Home DM Meds: Amaryl 4 mg QAM    Current Orders: Novolog 0-15 units TID ac/hs     MD- Note Severe Hypoglycemia yesterday at 4pm after pt received 2 units Novolog SSI at 12pm  Please consider reducing the Novolog to the 0-9 unit (Sensitive) scale    --Will follow patient during hospitalization--  Ambrose Finland RN, MSN, CDCES Diabetes Coordinator Inpatient Glycemic Control Team Team Pager: [phone] (8a-5p)

## 2022-08-12 NOTE — Consult Note (Signed)
Consultation Note Date: 08/12/2022   Patient Name: [name]  DOB: [birth year]  MRN: [id number]  Age / Sex: 89 y.o., female  PCP: Gracelyn Nurse, MD Referring Physician: Delfino Lovett, MD  Reason for Consultation: Establishing goals of care  HPI/Patient Profile: 89 y.o. female  with past medical history of dementia, under long-term care at [facility], prior history of stroke with hemiplegia and hemiparesis of left side which seems chronic, ulcer left lateral foot present at least since December 2023 per discharge summary, peripheral vascular disease, HTN/HLD, diabetes with polyneuropathy, OA, vaginal prolapse, admitted on 08/07/2022 with diabetic foot infection with osteomyelitis and peripheral arterial disease.   Clinical Assessment and Goals of Care: I have reviewed medical records including EPIC notes, labs and imaging, received report from RN, assessed the patient.  [name] is resting quietly in bed.  She is resting comfortably, but wakes easily when I call her name.  She greets me, making and mostly keeping eye contact.  She is alert and oriented to self with known dementia.  I believe that she is able to make her basic needs known.  There is no family at bedside at this time.  She is able to take a sip of cold water without overt signs and symptoms of aspiration.  She denies issues or concerns, pain, at this time.  I share that I will reach out to her son [name].  Call to son, Aryss Dibley, to discuss diagnosis prognosis, GOC, EOL wishes, disposition and options.  I introduced Palliative Medicine as specialized medical care for people living with serious illness. It focuses on providing relief from the symptoms and stress of a serious illness. The goal is to improve quality of life for both the patient and the family.  We discussed a brief life review of the patient.  [name] tells me that Mrs.  Kosman is a widow.  He states that he had a sister but she is deceased.  No other children.  [name] shares that [name] would was living independently in her own home until she went [facility] ago.  He states that she had a stroke 3 years ago.  He states that he was her main caregiver after stroke as he lives behind her.  He tells me that her memory loss has worsened since March of this year.  He tells me that DSS has become involved to help manage her finances.  He states that they have had a preliminary meeting but he expects a court date July 9 or 10.  We then focused on their current illness.  We talk about [name] acute and chronic health concerns.  We talked about her declines over the last few years, few months in particular.  We talk about successful revascularization yesterday.  We talk about her chronic ulcer and the treatment plan.  We talk about the possibility of further amputation if necessary for nonhealing wound.  [name] states that he anticipates coming to the hospital today to sign this  consent for surgery for tomorrow.  The natural disease trajectory and expectations at EOL were discussed.  Advanced directives, concepts specific to code status, artifical feeding and hydration, and rehospitalization were considered and discussed.  We talk about the concept of "treat the treatable, but allowing natural passing".  I share that just like I will get sick again, so will [name].  I encouraged him to consider what she would and would not want.  He tells me that he thinks that she would not want to be on life support in light of her chronic illness burden and in particular what is going on with her nonhealing wound.  I encouraged him to think about, pray about these choices.  I will follow-up tomorrow.  Discussed the importance of continued conversation with family and the medical providers regarding overall plan of care and treatment options, ensuring decisions  are within the context of the patient's values and GOCs.  Questions and concerns were addressed.  The family was encouraged to call with questions or concerns.  PMT will continue to support holistically.  Conference with attending, podiatry, bedside nursing staff, transition of care team related to patient condition, needs, goals of care, disposition.    HCPOA  NEXT OF KIN -only living child, [name].  [name] is a widow.  She had a daughter who is deceased. DSS is working for guardianship with court date July 9 or 10.    SUMMARY OF RECOMMENDATIONS   At this point continue to treat the treatable Plan for surgery 7/3 Considering CODE STATUS DSS guardianship case 7/9 or 10, per son   Code Status/Advance Care Planning: Full code - We talk about the concept of "treat the treatable, but allowing natural passing".  I share that just like I will get sick again, so will [name].  I encouraged him to consider what she would and would not want.  He tells me that he thinks that she would not want to be on life support in light of her chronic illness burden and in particular what is going on with her nonhealing wound.  I encouraged him to think about, pray about these choices.  Symptom Management:  Per hospitalist, no additional needs at this time.  Palliative Prophylaxis:  Frequent Pain Assessment, Oral Care, Palliative Wound Care, and Turn Reposition  Additional Recommendations (Limitations, Scope, Preferences): Full Scope Treatment  Psycho-social/Spiritual:  Desire for further Chaplaincy support:no Additional Recommendations: Caregiving  Support/Resources  Prognosis:  Unable to determine, guarded at this point.  3 months or less would not be surprising based on decreasing functional status, chronic illness burden, acute illness.  Discharge Planning: To Be Determined      Primary Diagnoses: Present on Admission:  Diabetic foot infection (HCC)  Type 2 diabetes  mellitus with polyneuropathy (HCC)  Mixed hyperlipidemia  Malnutrition of moderate degree  Left hemiparesis (HCC)  Cellulitis of left foot  PAD (peripheral artery disease) (HCC)  Essential hypertension   I have reviewed the medical record, interviewed the patient and family, and examined the patient. The following aspects are pertinent.  Past Medical History:  Diagnosis Date   Carpal tunnel syndrome on both sides    Diabetes (HCC)    Diabetic polyneuropathy (HCC)    Diverticulitis    Hyperlipidemia    Hypertension    Osteoarthritis    Vaginal prolapse    Social History   Socioeconomic History   Marital status: Widowed    Spouse name: Not on file   Number  of children: Not on file   Years of education: Not on file   Highest education level: Not on file  Occupational History   Not on file  Tobacco Use   Smoking status: Never   Smokeless tobacco: Never  Vaping Use   Vaping Use: Never used  Substance and Sexual Activity   Alcohol use: No   Drug use: No   Sexual activity: Not Currently  Other Topics Concern   Not on file  Social History Narrative   Lives at home by herself. Ambulates with a cane.   Social Determinants of Health   Financial Resource Strain: Not on file  Food Insecurity: Not on file  Transportation Needs: Not on file  Physical Activity: Not on file  Stress: Not on file  Social Connections: Not on file   Family History  Problem Relation Age of Onset   Breast cancer Mother 32   Breast cancer Maternal Aunt    Scheduled Meds:  aspirin EC  81 mg Oral Daily   atorvastatin  80 mg Oral q1800   clopidogrel  75 mg Oral Daily   cyanocobalamin  1,000 mcg Oral Daily   enoxaparin (LOVENOX) injection  40 mg Subcutaneous Q24H   feeding supplement (PRO-STAT SUGAR FREE 64)  30 mL Oral BID   ferrous sulfate  325 mg Oral QODAY   insulin aspart  0-15 Units Subcutaneous TID WC   insulin aspart  0-5 Units Subcutaneous QHS   magnesium oxide  400 mg Oral Daily    senna-docusate  2 tablet Oral BID   sodium chloride flush  3 mL Intravenous Q12H   sodium chloride flush  3 mL Intravenous Q12H   [START ON 08/14/2022] Vitamin D (Ergocalciferol)  50,000 Units Oral Q7 days   Continuous Infusions:  sodium chloride     piperacillin-tazobactam (ZOSYN)  IV 3.375 g (08/12/22 0523)   vancomycin 1,000 mg (08/11/22 1957)   PRN Meds:.sodium chloride, acetaminophen **OR** acetaminophen, acetaminophen, hydrALAZINE, labetalol, morphine injection, ondansetron (ZOFRAN) IV, oxyCODONE, polyethylene glycol, sodium chloride flush Medications Prior to Admission:  Prior to Admission medications   Medication Sig Start Date End Date Taking? Authorizing Provider  acetaminophen (TYLENOL) 325 MG tablet Take 2 tablets (650 mg total) by mouth every 6 (six) hours as needed for mild pain, moderate pain, fever or headache. 01/25/16  Yes Loflin, Laney Potash, MD  ascorbic acid (VITAMIN C) 500 MG tablet Take 500 mg by mouth 2 (two) times daily.   Yes [provider]  aspirin EC 81 MG tablet Take 81 mg by mouth daily. Swallow whole.   Yes [provider]  atorvastatin (LIPITOR) 80 MG tablet Take 1 tablet (80 mg total) by mouth daily. Patient taking differently: Take 80 mg by mouth at bedtime. 09/26/19  Yes Regalado, Belkys A, MD  clopidogrel (PLAVIX) 75 MG tablet Take 1 tablet (75 mg total) by mouth daily. 09/26/19  Yes Regalado, Belkys A, MD  cyanocobalamin (VITAMIN B12) 1000 MCG tablet Take 1,000 mcg by mouth daily.   Yes [provider]  ferrous sulfate 325 (65 FE) MG tablet Take 325 mg by mouth every other day.   Yes [provider]  glimepiride (AMARYL) 4 MG tablet Take 4 mg by mouth daily with breakfast. 07/29/22  Yes [provider]  hydrochlorothiazide (HYDRODIURIL) 12.5 MG tablet Take 12.5 mg by mouth daily. 07/18/22  Yes [provider]  lisinopril (ZESTRIL) 10 MG tablet Take 10 mg by mouth daily. 08/01/22  Yes [provider]  magnesium oxide (MAG-OX) 400 (240 Mg) MG tablet Take 400 mg by mouth daily.   Yes [provider]  Multiple Vitamins-Minerals (MULTIVITAMIN WITH MINERALS) tablet Take 1 tablet by mouth daily.   Yes [provider]  Vitamin D, Ergocalciferol, (DRISDOL) 1.25 MG (50000 UNIT) CAPS capsule Take 50,000 Units by mouth every 7 (seven) days. thursday   Yes [provider]  Amino Acids-Protein Hydrolys (FEEDING SUPPLEMENT, PRO-STAT SUGAR FREE 64,) LIQD Take 30 mLs by mouth in the morning and at bedtime.    [provider]  lisinopril (ZESTRIL) 5 MG tablet Take 5 mg by mouth daily. Patient not taking: Reported on 08/07/2022    [provider]   Allergies  Allergen Reactions   Metformin And Related Nausea And Vomiting   Review of Systems  Unable to perform ROS: Age    Physical Exam Vitals and nursing note reviewed.     Vital Signs: BP 137/61   Pulse 93   Temp 98.2 F (36.8 C)   Resp 16   Ht 5\' 6"  (1.676 m)   Wt 68 kg   SpO2 98%   BMI 24.20 kg/m  Pain Scale: 0-10 POSS *See Group Information*:  (will not attempt to wake, md ballooning. vss) Pain Score: 0-No pain   SpO2: SpO2: 98 % O2 Device:SpO2: 98 % O2 Flow Rate: .O2 Flow Rate (L/min): 2.5 L/min  IO: Intake/output summary:  Intake/Output Summary (Last 24 hours) at 08/12/2022 1522 Last data filed at 08/12/2022 4098 Gross per 24 hour  Intake 576.43 ml  Output 150 ml  Net 426.43 ml    LBM: Last BM Date : 08/12/22 Baseline Weight: Weight: 68 kg Most recent weight: Weight: 68 kg     Palliative Assessment/Data:     Time In: 1400 Time Out: 1515 Time Total: 75 minutes  Greater than 50%  of this time was spent counseling and coordinating care related to the above assessment and plan.  Signed by: Katheran Awe, NP   Please contact Palliative Medicine Team phone at [phone] for questions and concerns.  For individual provider: See Loretha Stapler

## 2022-08-12 NOTE — Consult Note (Signed)
Pharmacy Antibiotic Note  ASSESSMENT: 89 y.o. female with PMH dementia, HTN, DM, CVA is presenting with  wound infection on left foot . Pharmacy has been consulted to manage Zosyn and vancomycin dosing.  --hx Diabetes --unable to get MRI per family from [facility] that is lodged in R orbit   Patient measurements: Height: 5\' 6"  (167.6 cm) Weight: 68 kg (149 lb 14.6 oz) IBW/kg (Calculated) : 59.3  Vital signs: Temp: 98.2 F (36.8 C) (07/02 1516) BP: 137/61 (07/02 1516) Pulse Rate: 93 (07/02 1516) Recent Labs  Lab 08/09/22 0401 08/10/22 0422 08/11/22 0618 08/12/22 0358  WBC  --  9.4 11.3* 9.8  CREATININE 0.59 0.57  --  0.65    Estimated Creatinine Clearance: 47.3 mL/min (by C-G formula based on SCr of 0.65 mg/dL).  Allergies: Allergies  Allergen Reactions   Metformin And Related Nausea And Vomiting    Antimicrobials this admission: Ceftriaxone 6/27 x 1 Zosyn 6/27 >> Vancomycin 6/27 >>  Dose adjustments this admission: 08/12/22: Vancomycin 1000 mg > 2000 mg Q24H  Microbiology results:  6/27 BCx: NG<12hrs MRSA PCR neg  PLAN:  Vancomycin level assessment 08/11/22 dose: [facility] = 10.1, Trough 8.0; AUC 216 Goal AUC 400-550   Adjust Vancomycin to 2000 mg Q24H Estimated AUC 432, Cmin: 16.0  Continue Zosyn 3.375gm IV q8h Extended infusion over 4 hours  Follow up culture results to assess for antibiotic optimization. Monitor renal function to assess for any necessary antibiotic dosing changes.   Thank you for allowing pharmacy to be a part of this patient's care.  Sharen Hones, PharmD, BCPS Clinical Pharmacist   08/12/2022

## 2022-08-12 NOTE — Evaluation (Signed)
Physical Therapy Evaluation Patient Details Name: [name] MRN: [id number] DOB: Jun 11, 1933 Today's Date: 08/12/2022  History of Present Illness  presented to ER from LTC secondary to L foot swelling, redness; admitted for management of osteomyelitis of L diabetic foot ulcer, s/p L LE angioplasty (7/1)  Clinical Impression  Patient resting in bed upon arrival to room, laying diagonally in bed (R foot off bed).  Repositioned to neutral for optimal posture and overall participation with session.  Patient alert and oriented to self only; follows commands, pleasant and cooperative, but marked difficulty maintaining conversation/train of thought (very easily distractible).  Baseline L UE/LE hemiplegia with associated contractures present; R hemi-body grossly [facility] for basic transfers and mobility needs. Currently requiring total assist +1-2 for rolling, bed mobility, repositioning and OOB to chair.  Hoyer lift utilized during session; tolerated without difficulty.  Additional mobility tasks deferred, as patient appears to be at functional baseline (confirmed per facility representative on phone). Will complete initial PT orders at this time; will enroll with mobility specialist for continued assist with OOB to chair via hoyer lift throughout remainder of stay (in during session to assist as needed).  Please re-consult should needs change.      Assistance Recommended at Discharge Frequent or constant Supervision/Assistance  If plan is discharge home, recommend the following:  Can travel by private vehicle  Two people to help with walking and/or transfers;Two people to help with bathing/dressing/bathroom;Direct supervision/assist for medications management;Help with stairs or ramp for entrance;Assistance with feeding;Assist for transportation;Assistance with cooking/housework;Direct supervision/assist for financial management   No    Equipment Recommendations    Recommendations for Other  Services       Functional Status Assessment Patient has not had a recent decline in their functional status     Precautions / Restrictions Precautions Precautions: Fall Restrictions Weight Bearing Restrictions: No      Mobility  Bed Mobility Overal bed mobility: Needs Assistance Bed Mobility: Supine to Sit, Rolling Rolling: Total assist   Supine to sit: Total assist          Transfers Overall transfer level: Needs assistance   Transfers: Bed to chair/wheelchair/BSC               Transfer via Lift Equipment:  Investment banker, corporate)  Ambulation/Gait                  Stairs            Wheelchair Mobility     Tilt Bed    Modified Rankin (Stroke Patients Only)       Balance                                             Pertinent Vitals/Pain Pain Assessment Pain Assessment: No/denies pain    Home Living Family/patient expects to be discharged to:: Skilled nursing facility                        Prior Function Prior Level of Function : Needs assist             Mobility Comments: Per facility, total care/hoyer lift for transfers; manual WC as primary mobility, using R UE/LE to self-propel at times       Hand Dominance   Dominant Hand: Right    Extremity/Trunk Assessment   Upper Extremity Assessment Upper Extremity Assessment:  (  R UE grossly 4-/5 throughout; L UE grossly 0-1/5(L UE splint in place for contracture prevention/skin protection).Unable to passively mobilize L shoulder to shoulder height,unable to reach full L elbow extension and L wrist/fingers in near-fixed position)    Lower Extremity Assessment Lower Extremity Assessment:  (R LE grossly 3+/5 throughout; L LE grossly 0/5 with significant L knee flexion contracture (fixed approx 90-100 degrees), L prevalon boot in place)       Communication   Communication: No difficulties  Cognition Arousal/Alertness: Awake/alert Behavior During Therapy:  WFL for tasks assessed/performed Overall Cognitive Status: No family/caregiver present to determine baseline cognitive functioning                                 General Comments: Oriented to self only; unaware of location, reason for admission.  Answers questions, pleasant and cooperative; very limited/no recall of new information. Very easily distractible by external environment        General Comments      Exercises Other Exercises Other Exercises: L UE splint straps around hand removed for skin inspection; no noted breakdown on hand/wrist   Assessment/Plan    PT Assessment Patient does not need any further PT services  PT Problem List Decreased mobility       PT Treatment Interventions Functional mobility training;Therapeutic activities    PT Goals (Current goals can be found in the Care Plan section)  Acute Rehab PT Goals PT Goal Formulation: All assessment and education complete, DC therapy Time For Goal Achievement: 08/12/22    Frequency       Co-evaluation               AM-PAC PT "6 Clicks" Mobility  Outcome Measure Help needed turning from your back to your side while in a flat bed without using bedrails?: Total Help needed moving from lying on your back to sitting on the side of a flat bed without using bedrails?: Total Help needed moving to and from a bed to a chair (including a wheelchair)?: Total Help needed standing up from a chair using your arms (e.g., wheelchair or bedside chair)?: Total Help needed to walk in hospital room?: Total Help needed climbing 3-5 steps with a railing? : Total 6 Click Score: 6    End of Session   Activity Tolerance: Patient tolerated treatment well Patient left: in chair;with call bell/phone within reach;with chair alarm set Nurse Communication: Mobility status PT Visit Diagnosis: Muscle weakness (generalized) (M62.81)    Time: 1101-1131 PT Time Calculation (min) (ACUTE ONLY): 30 min   Charges:    PT Evaluation $PT Eval Moderate Complexity: 1 Mod   PT General Charges $$ ACUTE PT VISIT: 1 Visit        Terrel Manalo H. Manson Passey, PT, DPT, NCS 08/12/22, 4:49 PM [phone]

## 2022-08-12 NOTE — Progress Notes (Signed)
Mobility Specialist - Progress Note   08/12/22 1452  Mobility  Activity Transferred from chair to bed  Level of Assistance +2 (takes two people)  Assistive Device MaxiMove (hoyer lift)  Activity Response Tolerated well  $Mobility charge 1 Mobility  Mobility Specialist Start Time (ACUTE ONLY) 1430  Mobility Specialist Stop Time (ACUTE ONLY) 1448  Mobility Specialist Time Calculation (min) (ACUTE ONLY) 18 min   Pt sitting in the recliner upon entry, utilizing RA. Pt transferred to bed via hoyer lift +2 for safety, tolerated well. Pt left supine with alarm set and needs within reach.   Zetta Bills Mobility Specialist 08/12/22 2:57 PM

## 2022-08-12 NOTE — Plan of Care (Signed)

## 2022-08-12 NOTE — Progress Notes (Signed)
  Progress Note   Patient: [name] ZOX:[id number] DOB: 06-26-1933 DOA: 08/07/2022     5 DOS: the patient was seen and examined on 08/12/2022   Brief hospital course: SANTIA HARTELL is a 89 y.o. female with medical history significant of unspecified dementia, type 2 diabetes, diabetic polyneuropathy, essential hypertension and dyslipidemia who was sent over by wound care for worsening left foot ulcer with infection. CT scan showed significant cellulitis and concern for fifth metatarsal osteomyelitis.  Patient was started on Zosyn and vancomycin. Patient is evaluated by vascular surgery and podiatry.    7/1: S/p LLE angio and successful recanalization of the left lower extremity for limb salvage 7/2: Palliative care consult.  Podiatry follow-up to decide next types on debridement versus amputation  Principal Problem:   Diabetic foot infection (HCC) Active Problems:   Type 2 diabetes mellitus with polyneuropathy (HCC)   Mixed hyperlipidemia   Essential hypertension   Malnutrition of moderate degree   Left hemiparesis (HCC)   Cellulitis of left foot   Right hemiparesis (HCC)   PAD (peripheral artery disease) (HCC)   Hemiplegia affecting left side in left-dominant patient as late effect of cerebrovascular disease (HCC)   Acute osteomyelitis of left foot (HCC)   Assessment and Plan: Diabetic foot infection. Left foot cellulitis with osteomyelitis. Peripheral arterial disease. CT scan with contrast showed evidence of osteomyelitis, could not get MRI due to metal object in the eye. Patient did not meet sepsis criteria at time of admission, continue current antibiotic with Zosyn and vancomycin.   Patient has been evaluated by vascular surgery and podiatry, revascularization will be attempted, followed by debridement versus amputation. Status post left lower extremity angiogram and successful recanalization of the left lower extremity for limb salvage Podiatry to decide on next  steps/possible debridement versus fifth metatarsal base amputation   History of stroke with left hemiparesis. Dementia without behavioral disturbance. Continue home medicines.   Essential hypertension, Dyslipidemia. Due to anticipated vascular procedure with contrast, discontinued lisinopril and HCTZ.   Type 2 diabetes with polyneuropathy. Continue sliding scale insulin.   Anemia. Normal iron level, B12 level borderline at 350, started on B12, homocystine level pending.     Subjective:  Intermittent confusion remains  Physical Exam: Vitals:   08/11/22 2028 08/12/22 0008 08/12/22 0539 08/12/22 0736  BP: 138/61 (!) 131/50 138/63 (!) 132/58  Pulse: 86 76 82 86  Resp: 18 17 16 17   Temp: 98 F (36.7 C) 98.2 F (36.8 C) 98.4 F (36.9 C) 98.6 F (37 C)  TempSrc:   Oral   SpO2: 100% 100% 100% 100%  Weight:      Height:       General exam: Appears calm and comfortable  Respiratory system: Clear to auscultation. Respiratory effort normal. Cardiovascular system: S1 & S2 heard, RRR. No JVD, murmurs, rubs, gallops or clicks. No pedal edema. Gastrointestinal system: Abdomen is nondistended, soft and nontender. No organomegaly or masses felt. Normal bowel sounds heard. Central nervous system: Alert and oriented x2.  Left hemiplegia Extremities: Symmetric 5 x 5 power. Skin: No rashes, lesions or ulcers Psychiatry: Mood & affect appropriate.    Data Reviewed:  Results reviewed.  Family Communication: None  Disposition: Status is: Inpatient Remains inpatient appropriate because: Severity of disease, IV treatment, inpatient procedures.    DVT prophylaxis-Lovenox Time spent: 35 minutes  Author: Delfino Lovett, MD 08/12/2022 11:50 AM  For on call review [email or web address].

## 2022-08-12 NOTE — Progress Notes (Signed)
OT Screen Note  Patient Details Name: [name] MRN: [id number] DOB: 1933-12-15   Cancelled Treatment:    Reason Eval/Treat Not Completed: OT screened, no needs identified, will sign off. Consult received, chart reviewed. Spoke with PT after seeing pt. Pt at baseline, dependent for all ADL (set up for feeding), lift for transfers at facility. No skilled acute OT needs at this time. Will sign off.   Arman Filter., MPH, MS, OTR/L ascom [phone] 08/12/22, 1:40 PM

## 2022-08-12 NOTE — TOC Progression Note (Signed)
Transition of Care [facility]) - Progression Note    Patient Details  Name: [name] MRN: [id number] Date of Birth: 03/05/1933  [facility] CM/SW Contact  Garret Reddish, RN Phone Number: 08/12/2022, 10:43 AM  Clinical Narrative:   Chart reviewed.  Noted that patient was admitted with diabetic foot infection, PAD with left foot osteomyelitis.  Vascular Surgery and Podiatry following.  Patient is on IV Zosyn and IV Vancomycin.   Noted that patient had Lower extrem Angiography on 08-11-2022.    Patient is a long-term care resident at [facility].    TOC will continue to follow for discharge planning.      Expected Discharge Plan: Skilled Nursing Facility Barriers to Discharge: Continued Medical Work up  Expected Discharge Plan and Services       Living arrangements for the past 2 months: Skilled Nursing Facility                                       Social Determinants of Health (SDOH) Interventions SDOH Screenings   Tobacco Use: Low Risk  (08/12/2022)    Readmission Risk Interventions     No data to display

## 2022-08-12 NOTE — Progress Notes (Addendum)
Daily Progress Note   Subjective  - 1 Day Post-Op  Follow-up left lateral foot full-thickness ulceration with osteomyelitis of the fifth metatarsal base.  She underwent revascularization yesterday    Objective Vitals:   08/11/22 2028 08/12/22 0008 08/12/22 0539 08/12/22 0736  BP: 138/61 (!) 131/50 138/63 (!) 132/58  Pulse: 86 76 82 86  Resp: 18 17 16 17   Temp: 98 F (36.7 C) 98.2 F (36.8 C) 98.4 F (36.9 C) 98.6 F (37 C)  TempSrc:   Oral   SpO2: 100% 100% 100% 100%  Weight:      Height:        Physical Exam: Large open wound lateral fifth metatarsal with exposed bone.  Area of mild necrosis just distal to the ulcerative site to the left fifth metatarsal base area.  Some ecchymosis to the distal aspect of the left great toe.  This does not appear to be gangrenous more inflamed at this time.  Will have to continue to monitor.  See clinical picture.       Laboratory CBC    Component Value Date/Time   WBC 9.8 08/12/2022 0358   HGB 8.5 (L) 08/12/2022 0358   HGB 8.6 (L) 09/25/2013 0410   HCT 27.1 (L) 08/12/2022 0358   HCT 27.0 (L) 09/24/2013 0409   PLT 276 08/12/2022 0358   PLT 192 09/24/2013 0409    BMET    Component Value Date/Time   NA 137 08/12/2022 0358   NA 143 09/24/2013 0409   K 3.5 08/12/2022 0358   K 3.6 09/24/2013 0409   CL 106 08/12/2022 0358   CL 108 (H) 09/24/2013 0409   CO2 24 08/12/2022 0358   CO2 27 09/24/2013 0409   GLUCOSE 163 (H) 08/12/2022 0358   GLUCOSE 222 (H) 09/24/2013 0409   BUN 12 08/12/2022 0358   BUN 7 09/24/2013 0409   CREATININE 0.65 08/12/2022 0358   CREATININE 0.77 09/24/2013 0409   CALCIUM 8.2 (L) 08/12/2022 0358   CALCIUM 7.7 (L) 09/24/2013 0409   GFRNONAA >60 08/12/2022 0358   GFRNONAA >60 09/24/2013 0409   GFRAA >60 09/26/2019 1952   GFRAA >60 09/24/2013 0409    Assessment/Planning: Osteomyelitis left fifth metatarsal Severe peripheral vascular disease status post left lower extremity angio with recanalization of  the left extremity for limb salvage Diabetes with neuropathy  She has a large full-thickness necrotic ulcer down to bone with exposed bone with erosive changes on both CT and x-ray.  I would recommend excision of the necrotic bone and ulcer to the lateral aspect of the foot.  Likely performed entire fifth ray amputation to remove all of the necrotic bone and allow for primary closure of the wound.  Patient would be at high risk of nonhealing of this wound given the poor circulation to the lower extremity despite revascularization.  Patient does not ambulate.  She is in a severe contracture on this left lower extremity.  If this was to fail would recommend amputation via vascular surgery.  Will discuss case with son to obtain consent for procedure.  If consent has been given we will plan for surgery tomorrow afternoon. Addendum: Called contact number for patient's son.  No answer and voicemail was full unable to leave a message.  Will have nursing contact me if son calls and or to see the patient.  Addendum:  Spoke to Son and discuss r/b/a/c including anesthesia risks as well as complication of healing and possible further surgery/amputation and consent given.  Gwyneth Revels  A  08/12/2022, 12:40 PM

## 2022-08-13 ENCOUNTER — Encounter: Payer: Self-pay | Admitting: Internal Medicine

## 2022-08-13 ENCOUNTER — Encounter
Admission: EM | Disposition: A | Discharge status: Skilled Nursing Facility | Payer: Self-pay | Source: Skilled Nursing Facility | Attending: Internal Medicine

## 2022-08-13 ENCOUNTER — Other Ambulatory Visit: Payer: Self-pay

## 2022-08-13 ENCOUNTER — Inpatient Hospital Stay: Payer: Medicare HMO | Admitting: Anesthesiology

## 2022-08-13 DIAGNOSIS — E11628 Type 2 diabetes mellitus with other skin complications: Secondary | ICD-10-CM | POA: Diagnosis not present

## 2022-08-13 DIAGNOSIS — L089 Local infection of the skin and subcutaneous tissue, unspecified: Secondary | ICD-10-CM | POA: Diagnosis not present

## 2022-08-13 DIAGNOSIS — Z515 Encounter for palliative care: Secondary | ICD-10-CM | POA: Diagnosis not present

## 2022-08-13 DIAGNOSIS — I10 Essential (primary) hypertension: Secondary | ICD-10-CM | POA: Diagnosis not present

## 2022-08-13 DIAGNOSIS — M86172 Other acute osteomyelitis, left ankle and foot: Secondary | ICD-10-CM | POA: Diagnosis not present

## 2022-08-13 DIAGNOSIS — Z7189 Other specified counseling: Secondary | ICD-10-CM | POA: Diagnosis not present

## 2022-08-13 DIAGNOSIS — L03116 Cellulitis of left lower limb: Secondary | ICD-10-CM | POA: Diagnosis not present

## 2022-08-13 HISTORY — PX: AMPUTATION TOE: SHX6595

## 2022-08-13 LAB — CBC
HCT: 26 % — ABNORMAL LOW (ref 36.0–46.0)
Hemoglobin: 8.3 g/dL — ABNORMAL LOW (ref 12.0–15.0)
MCH: 30.6 pg (ref 26.0–34.0)
MCHC: 31.9 g/dL (ref 30.0–36.0)
MCV: 95.9 fL (ref 80.0–100.0)
Platelets: 280 10*3/uL (ref 150–400)
RBC: 2.71 MIL/uL — ABNORMAL LOW (ref 3.87–5.11)
RDW: 13.2 % (ref 11.5–15.5)
WBC: 13.4 10*3/uL — ABNORMAL HIGH (ref 4.0–10.5)
nRBC: 0 % (ref 0.0–0.2)

## 2022-08-13 LAB — GLUCOSE, CAPILLARY
Glucose-Capillary: 171 mg/dL — ABNORMAL HIGH (ref 70–99)
Glucose-Capillary: 108 mg/dL — ABNORMAL HIGH (ref 70–99)
Glucose-Capillary: 108 mg/dL — ABNORMAL HIGH (ref 70–99)
Glucose-Capillary: 115 mg/dL — ABNORMAL HIGH (ref 70–99)
Glucose-Capillary: 87 mg/dL (ref 70–99)
Glucose-Capillary: 121 mg/dL — ABNORMAL HIGH (ref 70–99)

## 2022-08-13 LAB — PROTEIN ELECTROPHORESIS, SERUM
A/G Ratio: 0.8 (ref 0.7–1.7)
Albumin ELP: 2.7 g/dL — ABNORMAL LOW (ref 2.9–4.4)
Alpha-1-Globulin: 0.3 g/dL (ref 0.0–0.4)
Alpha-2-Globulin: 1.1 g/dL — ABNORMAL HIGH (ref 0.4–1.0)
Beta Globulin: 0.9 g/dL (ref 0.7–1.3)
Gamma Globulin: 1 g/dL (ref 0.4–1.8)
Globulin, Total: 3.2 g/dL (ref 2.2–3.9)
Total Protein ELP: 5.9 g/dL — ABNORMAL LOW (ref 6.0–8.5)

## 2022-08-13 LAB — BASIC METABOLIC PANEL
Anion gap: 9 (ref 5–15)
BUN: 19 mg/dL (ref 8–23)
CO2: 21 mmol/L — ABNORMAL LOW (ref 22–32)
Calcium: 8 mg/dL — ABNORMAL LOW (ref 8.9–10.3)
Chloride: 102 mmol/L (ref 98–111)
Creatinine, Ser: 0.89 mg/dL (ref 0.44–1.00)
GFR, Estimated: >60 mL/min (ref >60)
Glucose, Bld: 180 mg/dL — ABNORMAL HIGH (ref 70–99)
Potassium: 3.5 mmol/L (ref 3.5–5.1)
Sodium: 132 mmol/L — ABNORMAL LOW (ref 135–145)

## 2022-08-13 SURGERY — AMPUTATION, TOE
Anesthesia: General | Site: Toe | Laterality: Left

## 2022-08-13 MED ORDER — PROPOFOL 10 MG/ML IV BOLUS
INTRAVENOUS | Status: AC
  Filled 2022-08-13: qty 40

## 2022-08-13 MED ORDER — 0.9 % SODIUM CHLORIDE (POUR BTL) OPTIME
TOPICAL | Status: DC | PRN
  Administered 2022-08-13: 500 mL

## 2022-08-13 MED ORDER — OXYCODONE HCL 5 MG PO TABS: 5.0000 mg | ORAL_TABLET | Freq: Once | ORAL | Status: DC | PRN

## 2022-08-13 MED ORDER — PROPOFOL 10 MG/ML IV BOLUS
INTRAVENOUS | Status: DC | PRN
  Administered 2022-08-13: 50 mg via INTRAVENOUS
  Administered 2022-08-13: 20 mg via INTRAVENOUS

## 2022-08-13 MED ORDER — PHENYLEPHRINE 80 MCG/ML (10ML) SYRINGE FOR IV PUSH (FOR BLOOD PRESSURE SUPPORT)
PREFILLED_SYRINGE | INTRAVENOUS | Status: AC
  Filled 2022-08-13: qty 10

## 2022-08-13 MED ORDER — PROPOFOL 500 MG/50ML IV EMUL
INTRAVENOUS | Status: DC | PRN
  Administered 2022-08-13: 50 ug/kg/min via INTRAVENOUS

## 2022-08-13 MED ORDER — PHENYLEPHRINE 80 MCG/ML (10ML) SYRINGE FOR IV PUSH (FOR BLOOD PRESSURE SUPPORT)
PREFILLED_SYRINGE | INTRAVENOUS | Status: DC | PRN
  Administered 2022-08-13: 160 ug via INTRAVENOUS

## 2022-08-13 MED ORDER — OXYCODONE HCL 5 MG/5ML PO SOLN: 5.0000 mg | Freq: Once | ORAL | Status: DC | PRN

## 2022-08-13 MED ORDER — BUPIVACAINE HCL (PF) 0.5 % IJ SOLN
INTRAMUSCULAR | Status: AC
  Filled 2022-08-13: qty 30

## 2022-08-13 MED ORDER — FENTANYL CITRATE (PF) 100 MCG/2ML IJ SOLN: 25.0000 ug | INTRAMUSCULAR | Status: DC | PRN

## 2022-08-13 MED ORDER — LIDOCAINE HCL (PF) 1 % IJ SOLN
INTRAMUSCULAR | Status: AC
  Filled 2022-08-13: qty 30

## 2022-08-13 MED ORDER — LIDOCAINE-EPINEPHRINE 1 %-1:100000 IJ SOLN
INTRAMUSCULAR | Status: DC | PRN
  Administered 2022-08-13: 5 mL

## 2022-08-13 MED ORDER — AMOXICILLIN-POT CLAVULANATE 875-125 MG PO TABS
1.0000 | ORAL_TABLET | Freq: Two times a day (BID) | ORAL | Status: DC
  Administered 2022-08-14 – 2022-08-20 (×13): 1 via ORAL
  Filled 2022-08-13 (×14): qty 1

## 2022-08-13 MED ORDER — VANCOMYCIN HCL 1500 MG/300ML IV SOLN: 1500.0000 mg | INTRAVENOUS | Status: DC

## 2022-08-13 MED ORDER — DEXMEDETOMIDINE HCL IN NACL 80 MCG/20ML IV SOLN
INTRAVENOUS | Status: DC | PRN
  Administered 2022-08-13 (×2): 4 ug via INTRAVENOUS

## 2022-08-13 MED ORDER — LIDOCAINE-EPINEPHRINE 1 %-1:100000 IJ SOLN
INTRAMUSCULAR | Status: AC
  Filled 2022-08-13: qty 1

## 2022-08-13 MED ORDER — BUPIVACAINE HCL 0.5 % IJ SOLN
INTRAMUSCULAR | Status: DC | PRN
  Administered 2022-08-13: 5 mL

## 2022-08-13 SURGICAL SUPPLY — 56 items
BLADE OSC/SAGITTAL MD 5.5X18 (BLADE) ×1 IMPLANT
BLADE SURG MINI STRL (BLADE) ×1 IMPLANT
BNDG CMPR 5X4 CHSV STRCH STRL (GAUZE/BANDAGES/DRESSINGS) ×1
BNDG CMPR 75X21 PLY HI ABS (MISCELLANEOUS) ×1
BNDG CMPR STD VLCR NS LF 5.8X4 (GAUZE/BANDAGES/DRESSINGS) ×1
BNDG COHESIVE 4X5 TAN STRL LF (GAUZE/BANDAGES/DRESSINGS) IMPLANT
BNDG ELASTIC 4X5.8 VLCR NS LF (GAUZE/BANDAGES/DRESSINGS) ×1 IMPLANT
BNDG ESMARCH 4 X 12 STRL LF (GAUZE/BANDAGES/DRESSINGS) ×1
BNDG ESMARCH 4X12 STRL LF (GAUZE/BANDAGES/DRESSINGS) ×1 IMPLANT
BNDG GAUZE DERMACEA FLUFF 4 (GAUZE/BANDAGES/DRESSINGS) ×1 IMPLANT
BNDG GZE 12X3 1 PLY HI ABS (GAUZE/BANDAGES/DRESSINGS) ×2
BNDG GZE DERMACEA 4 6PLY (GAUZE/BANDAGES/DRESSINGS) ×1
BNDG STRETCH GAUZE 3IN X12FT (GAUZE/BANDAGES/DRESSINGS) ×2 IMPLANT
CANISTER WOUND CARE 500ML ATS (WOUND CARE) IMPLANT
CUFF TOURN SGL QUICK 12 (TOURNIQUET CUFF) IMPLANT
CUFF TOURN SGL QUICK 18X4 (TOURNIQUET CUFF) IMPLANT
DRAPE FLUOR MINI C-ARM 54X84 (DRAPES) ×1 IMPLANT
DRAPE XRAY CASSETTE 23X24 (DRAPES) ×1 IMPLANT
DRSG VAC GRANUFOAM MED (GAUZE/BANDAGES/DRESSINGS) IMPLANT
DURAPREP 26ML APPLICATOR (WOUND CARE) ×1 IMPLANT
ELECT REM PT RETURN 9FT ADLT (ELECTROSURGICAL) ×1
ELECTRODE REM PT RTRN 9FT ADLT (ELECTROSURGICAL) ×1 IMPLANT
GAUZE PACKING IODOFORM 1/2INX (GAUZE/BANDAGES/DRESSINGS) ×1 IMPLANT
GAUZE SPONGE 4X4 12PLY STRL (GAUZE/BANDAGES/DRESSINGS) ×1 IMPLANT
GAUZE STRETCH 2X75IN STRL (MISCELLANEOUS) ×1 IMPLANT
GAUZE XEROFORM 1X8 LF (GAUZE/BANDAGES/DRESSINGS) ×1 IMPLANT
GLOVE BIO SURGEON STRL SZ7.5 (GLOVE) ×1 IMPLANT
GLOVE INDICATOR 8.0 STRL GRN (GLOVE) ×1 IMPLANT
GOWN SPEC L4 XLG W/TWL (GOWN DISPOSABLE) ×1 IMPLANT
GOWN STRL REUS W/ TWL XL LVL3 (GOWN DISPOSABLE) ×1 IMPLANT
GOWN STRL REUS W/TWL XL LVL3 (GOWN DISPOSABLE) ×1
HANDPIECE VERSAJET DEBRIDEMENT (MISCELLANEOUS) IMPLANT
IV NS IRRIG 3000ML ARTHROMATIC (IV SOLUTION) ×1 IMPLANT
KIT TURNOVER KIT A (KITS) ×1 IMPLANT
LABEL OR SOLS (LABEL) ×1 IMPLANT
MANIFOLD NEPTUNE II (INSTRUMENTS) ×1 IMPLANT
NDL FILTER BLUNT 18X1 1/2 (NEEDLE) ×1 IMPLANT
NDL HYPO 25X1 1.5 SAFETY (NEEDLE) ×1 IMPLANT
NEEDLE FILTER BLUNT 18X1 1/2 (NEEDLE) ×1 IMPLANT
NEEDLE HYPO 25X1 1.5 SAFETY (NEEDLE) ×1 IMPLANT
NS IRRIG 500ML POUR BTL (IV SOLUTION) ×1 IMPLANT
PACK EXTREMITY ARMC (MISCELLANEOUS) ×1 IMPLANT
PAD ABD DERMACEA PRESS 5X9 (GAUZE/BANDAGES/DRESSINGS) ×2 IMPLANT
PULSAVAC PLUS IRRIG FAN TIP (DISPOSABLE) ×1
SHIELD FULL FACE ANTIFOG 7M (MISCELLANEOUS) ×1 IMPLANT
STOCKINETTE M/LG 89821 (MISCELLANEOUS) ×1 IMPLANT
STRAP SAFETY 5IN WIDE (MISCELLANEOUS) ×1 IMPLANT
SUT ETHILON 3-0 FS-10 30 BLK (SUTURE) ×1
SUT ETHILON 5-0 FS-2 18 BLK (SUTURE) ×1 IMPLANT
SUT VIC AB 4-0 FS2 27 (SUTURE) ×1 IMPLANT
SUTURE EHLN 3-0 FS-10 30 BLK (SUTURE) ×1 IMPLANT
SWAB CULTURE AMIES ANAERIB BLU (MISCELLANEOUS) IMPLANT
SYR 10ML LL (SYRINGE) ×3 IMPLANT
TIP FAN IRRIG PULSAVAC PLUS (DISPOSABLE) ×1 IMPLANT
TRAP FLUID SMOKE EVACUATOR (MISCELLANEOUS) ×1 IMPLANT
WATER STERILE IRR 500ML POUR (IV SOLUTION) ×1 IMPLANT

## 2022-08-13 NOTE — Anesthesia Preprocedure Evaluation (Signed)
Anesthesia Evaluation  Patient identified by MRN, date of birth, ID band Patient awake    Reviewed: Allergy & Precautions, NPO status , Patient's Chart, lab work & pertinent test results  History of Anesthesia Complications Negative for: history of anesthetic complications  Airway Mallampati: III  TM Distance: >3 FB Neck ROM: full    Dental  (+) Poor Dentition   Pulmonary neg pulmonary ROS   Pulmonary exam normal        Cardiovascular hypertension, On Medications negative cardio ROS Normal cardiovascular exam     Neuro/Psych  PSYCHIATRIC DISORDERS     Dementia  Neuromuscular disease CVA (L hemiparesis), Residual Symptoms    GI/Hepatic negative GI ROS, Neg liver ROS,,,  Endo/Other  negative endocrine ROSdiabetes, Type 2    Renal/GU negative Renal ROS  negative genitourinary   Musculoskeletal   Abdominal   Peds  Hematology negative hematology ROS (+)   Anesthesia Other Findings Past Medical History: No date: Carpal tunnel syndrome on both sides No date: Diabetes (HCC) No date: Diabetic polyneuropathy (HCC) No date: Diverticulitis No date: Hyperlipidemia No date: Hypertension No date: Osteoarthritis No date: Vaginal prolapse  Past Surgical History: No date: CESAREAN SECTION 01/09/2022: IRRIGATION AND DEBRIDEMENT FOOT; Left     Comment:  Procedure: IRRIGATION AND DEBRIDEMENT FOOT WITH BONE               BIOPSY;  Surgeon: Gwyneth Revels, DPM;  Location: [facility]               ORS;  Service: Podiatry;  Laterality: Left; 06/11/2021: LOWER EXTREMITY ANGIOGRAPHY; Left     Comment:  Procedure: Lower Extremity Angiography;  Surgeon:               Renford Dills, MD;  Location: [facility] INVASIVE CV LAB;               Service: Cardiovascular;  Laterality: Left; 08/11/2022: LOWER EXTREMITY ANGIOGRAPHY; Left     Comment:  Procedure: Lower Extremity Angiography;  Surgeon:               Renford Dills, MD;  Location: [facility]  INVASIVE CV LAB;               Service: Cardiovascular;  Laterality: Left; No date: pessary No date: REPLACEMENT TOTAL KNEE BILATERAL No date: VENTRAL HERNIA REPAIR 01/13/2016: VENTRAL HERNIA REPAIR; N/A     Comment:  Procedure: HERNIA REPAIR VENTRAL ADULT WITH SMALL BOWEL               RESECTION, LYSIS OF ADHESIONS;  Surgeon: Gladis Riffle, MD;  Location: [facility] ORS;  Service: General;                Laterality: N/A;  BMI    Body Mass Index: 24.20 kg/m      Reproductive/Obstetrics negative OB ROS                              Anesthesia Physical Anesthesia Plan  ASA: 3  Anesthesia Plan: General   Post-op Pain Management: Tylenol PO (pre-op)*   Induction: Intravenous  PONV Risk Score and Plan: 2 and Propofol infusion and TIVA  Airway Management Planned: Natural Airway and Nasal Cannula  Additional Equipment:   Intra-op Plan:   Post-operative Plan:   Informed Consent: I have reviewed the patients History and Physical, chart, labs  and discussed the procedure including the risks, benefits and alternatives for the proposed anesthesia with the patient or authorized representative who has indicated his/her understanding and acceptance.    Continue DNR and Discussed DNR with power of attorney.   Dental Advisory Given  Plan Discussed with: Anesthesiologist, CRNA and Surgeon  Anesthesia Plan Comments: (Patient consented for risks of anesthesia including but not limited to:  - adverse reactions to medications - damage to eyes, teeth, lips or other oral mucosa - nerve damage due to positioning  - sore throat or hoarseness - Damage to heart, brain, nerves, lungs, other parts of body or loss of life  Patient voiced understanding.  Discussed DNR with son, Despina Luff, he wishes to continue DNR in the perioperative period. Ok to use medication/IV fluids and cardiac monitoring as indicated. Will rescind DNI if needed for procedure  with planned extubation in the OR. )         Anesthesia Quick Evaluation

## 2022-08-13 NOTE — Progress Notes (Signed)
Palliative: [name] is sitting up in the [place] chair in her room.  Mobility specialist has just Hoyer at her to the chair.  She greets me, making and mostly keeping eye contact.  She is oriented to person and place, but with known memory loss.  I believe that she can make her basic needs known.  There is no family at bedside at this time.  [name] independently brings up her concerns about her treatment plan stating something about care for her foot.  She states, "don't make it where I can't walk".  She tells I ask about her use of a wheelchair, can she self propel.  She tells me that she is able to walk which she points to a distance of about 5 feet.  It is clear that she cannot walk.  I asked her, "what what [name] say?"  She implies that he would say do everything.  I share that I will call and update him.  Call to son, [name].   We talk about CODE STATUS.  [name] states that he thought about CODE STATUS last night and is agreeable to DNR.  We talk about what this time looks like and feels like for [name].  He states that he has had these discussions with his own children about his care.  We talk about surgery and recovery.  I encourage [name] to consider whether he would put [name] through another surgery if this were to prove unsuccessful.  [name] easily and readily states that he does not believe that he would try a second surgery.  We talk about rehab afterwards, time for recovery.  Conference with attending, bedside nursing staff, transition of care team related to patient condition, needs, goals of care, disposition.  Plan: Continue to treat the treatable but no CPR or intubation.  Agreeable for surgical intervention today.  Anticipate need for short-term rehab and returning to long-term care.  States does not believe that he would accept second surgery. DSS guardianship court date 7/9 or 10 per son.     50 minutes  [name] Carmel, NP Palliative medicine team Team  phone [phone] Greater than 50% of this time was spent counseling and coordinating care related to the above assessment and plan. [name] Carmel, NP Palliative medicine team Team phone [phone] Greater than 50% of this time was spent counseling and coordinating care related to the above assessment and plan.

## 2022-08-13 NOTE — Progress Notes (Signed)
Mobility Specialist - Progress Note  08/13/22 1011  Mobility  Activity Transferred from bed to chair;Turned to left side;Turned to right side  Level of Assistance +2 (takes two people)  Hotel manager (hoyer lift)  Activity Response Tolerated well  $Mobility charge 1 Mobility  Mobility Specialist Start Time (ACUTE ONLY) [id number]  Mobility Specialist Stop Time (ACUTE ONLY) 1010  Mobility Specialist Time Calculation (min) (ACUTE ONLY) 23 min   Pt supine upon entry, utilizing RA. Pt transferred to the recliner via hoyer lift +2 for safety/line management. Pt left seated in the recliner with alarm set and needs within reach.   Zetta Bills Mobility Specialist 08/13/22 10:20 AM

## 2022-08-13 NOTE — Progress Notes (Signed)
Progress Note   Patient: [name]:[id number] DOB: 03-27-1933 DOA: 08/07/2022     6 DOS: the patient was seen and examined on 08/13/2022   Brief hospital course: DENNYS FRISINGER is a 89 y.o. female with medical history significant of unspecified dementia, type 2 diabetes, diabetic polyneuropathy, essential hypertension and dyslipidemia who was sent over by wound care for worsening left foot ulcer with infection. CT scan showed significant cellulitis and concern for fifth metatarsal osteomyelitis.  Patient was started on Zosyn and vancomycin. Patient is evaluated by vascular surgery and podiatry.    7/1: S/p LLE angio and successful recanalization of the left lower extremity for limb salvage 7/2: Palliative care consult.  Podiatry follow-up to decide next types on debridement versus amputation 7/3: Podiatry planning fifth toe amputation and removal of necrotic bone today  Principal Problem:   Diabetic foot infection (HCC) Active Problems:   Type 2 diabetes mellitus with polyneuropathy (HCC)   Mixed hyperlipidemia   Essential hypertension   Malnutrition of moderate degree   Left hemiparesis (HCC)   Cellulitis of left foot   Right hemiparesis (HCC)   PAD (peripheral artery disease) (HCC)   Hemiplegia affecting left side in left-dominant patient as late effect of cerebrovascular disease (HCC)   Acute osteomyelitis of left foot (HCC)   Assessment and Plan: Diabetic foot infection. Left foot cellulitis with osteomyelitis. Peripheral arterial disease. CT scan with contrast showed evidence of osteomyelitis, could not get MRI due to metal object in the eye. Patient did not meet sepsis criteria at time of admission, continue current antibiotic with Zosyn and vancomycin.  Can stop vancomycin after surgery today.  Transition to oral antibiotics tomorrow Status post left lower extremity angiogram and successful recanalization of the left lower extremity for limb salvage Podiatry  planning to take her to the OR today   History of stroke with left hemiparesis. Dementia without behavioral disturbance. Continue home medicines.  Requires Hoyer lift for transfer   Essential hypertension, Dyslipidemia. Due to anticipated vascular procedure with contrast, discontinued lisinopril and HCTZ.   Type 2 diabetes with polyneuropathy. Continue sliding scale insulin.   Anemia. Normal iron level, B12 level borderline at 350, started on B12, homocystine level pending.     Subjective:  Sitting in the chair.  No new issues  Physical Exam: Vitals:   08/12/22 2334 08/13/22 0412 08/13/22 0743 08/13/22 1405  BP: (!) 131/48 (!) 125/58 (!) 120/51 (!) 130/54  Pulse: 93 86 87 80  Resp: 20 20 17 16   Temp: 99.5 F (37.5 C) 98.6 F (37 C) 97.7 F (36.5 C) 97.7 F (36.5 C)  TempSrc:      SpO2: 100% 94% 98% 99%  Weight:      Height:       General exam: Appears calm and comfortable  Respiratory system: Clear to auscultation. Respiratory effort normal. Cardiovascular system: S1 & S2 heard, RRR. No JVD, murmurs, rubs, gallops or clicks. No pedal edema. Gastrointestinal system: Abdomen is nondistended, soft and nontender. No organomegaly or masses felt. Normal bowel sounds heard. Central nervous system: Alert and oriented x2.  Left hemiplegia Extremities/skin left hemiplegia.  She has a large open wound on the fifth metatarsal with exposed bone Psychiatry: Mood & affect appropriate.    Data Reviewed:  Results reviewed.  Family Communication: None at bedside  Disposition: Status is: Inpatient Remains inpatient appropriate because: Podiatry planning to take her to the OR likely for excision of the necrotic bone and possible fifth ray amputation  DVT prophylaxis-Lovenox Time spent: 35 minutes  Author: Delfino Lovett, MD 08/13/2022 2:29 PM  For on call review [email or web address].

## 2022-08-13 NOTE — Consult Note (Addendum)
Pharmacy Antibiotic Note  ASSESSMENT: 89 y.o. female with PMH dementia, HTN, DM, CVA is presenting with  wound infection on left foot . Pharmacy has been consulted to manage Zosyn and vancomycin dosing.  --hx Diabetes --unable to get MRI per family from BB that is lodged in R orbit   Vancomycin levels:   08/11/22 dose: Peak = 10.1, Trough 8.0;  calculated AUC 216 Peak looks to be error  PLAN: Due to concern for peak being error, will not use to calculate vancomycin dose, based on trough value which seems believable, will adjust vancomycin to 1500mg  IV q24h for a estimated trough of 12 mcg/ml and suspect AUC will likely be therapeutic.  Goal AUC 400-600  Continue Zosyn 3.375gm IV q8h Extended infusion over 4 hours Follow up culture results to assess for antibiotic optimization. Monitor renal function to assess for any necessary antibiotic dosing changes. Monitor renal function closely  Follow-up ability to change to PO antibiotic after toe amputation 7/3  Patient measurements: Height: 5\' 6"  (167.6 cm) Weight: 68 kg (149 lb 14.6 oz) IBW/kg (Calculated) : 59.3  Vital signs: Temp: 97.7 F (36.5 C) (07/03 0743) BP: 120/51 (07/03 0743) Pulse Rate: 87 (07/03 0743) Recent Labs  Lab 08/10/22 0422 08/11/22 0618 08/12/22 0358 08/13/22 0538  WBC 9.4 11.3* 9.8 13.4*  CREATININE 0.57  --  0.65 0.89    Estimated Creatinine Clearance: 42.5 mL/min (by C-G formula based on SCr of 0.89 mg/dL).  Allergies: Allergies  Allergen Reactions   Metformin And Related Nausea And Vomiting    Antimicrobials this admission: Ceftriaxone 6/27 x 1 Zosyn 6/27 >> Vancomycin 6/27 >>  Dose adjustments this admission: 08/12/22: Vancomycin 1000 mg > 1500mg  q24h  Microbiology results:  6/27 BCx: NG<12hrs MRSA PCR neg  Thank you for allowing pharmacy to be a part of this patient's care.  Juliette Alcide, PharmD, BCPS, BCIDP Work Cell: [phone] 08/13/2022 9:52 AM

## 2022-08-13 NOTE — Anesthesia Postprocedure Evaluation (Signed)
Anesthesia Post Note  Patient: [name]  Procedure(s) Performed: EXCISION OF 5TH RAY (Left: Toe)  Patient location during evaluation: PACU Anesthesia Type: General Level of consciousness: awake and alert Pain management: pain level controlled Vital Signs Assessment: post-procedure vital signs reviewed and stable Respiratory status: spontaneous breathing, nonlabored ventilation, respiratory function stable and patient connected to nasal cannula oxygen Cardiovascular status: blood pressure returned to baseline and stable Postop Assessment: no apparent nausea or vomiting Anesthetic complications: no   No notable events documented.   Last Vitals:  Vitals:   08/13/22 1637 08/13/22 1640  BP:  (!) 112/59  Pulse: 65 64  Resp: 13 14  Temp:    SpO2: 100% 100%    Last Pain:  Vitals:   08/13/22 1610  TempSrc:   PainSc: 0-No pain                 Corinda Gubler

## 2022-08-13 NOTE — Transfer of Care (Signed)
Immediate Anesthesia Transfer of Care Note  Patient: [name]  Procedure(s) Performed: EXCISION OF 5TH RAY (Left: Toe)  Patient Location: PACU  Anesthesia Type:General  Level of Consciousness: drowsy and patient cooperative  Airway & Oxygen Therapy: Patient Spontanous Breathing  Post-op Assessment: Report given to RN and Post -op Vital signs reviewed and stable  Post vital signs: Reviewed and stable  Last Vitals:  Vitals Value Taken Time  BP 96/54 08/13/22 1610  Temp 36.2 C 08/13/22 1610  Pulse 66 08/13/22 1614  Resp 16 08/13/22 1614  SpO2 100 % 08/13/22 1614  Vitals shown include unvalidated device data.  Last Pain:  Vitals:   08/13/22 1610  TempSrc:   PainSc: 0-No pain      Patients Stated Pain Goal: 2 (08/10/22 0857)  Complications: No notable events documented.

## 2022-08-13 NOTE — Op Note (Signed)
Operative note   Surgeon:Eula Jaster Armed forces logistics/support/administrative officer: None    Preop diagnosis: Osteomyelitis left fifth metatarsal    Postop diagnosis: Same    Procedure: 1.  Fifth ray amputation left foot 2.  Wound VAC placement left lateral foot    EBL: Minimal    Anesthesia:local and IV sedation.  Local consisted of a total of 10 cc of a one-to-one mixture of 1% lidocaine with epinephrine and 0.5% bupivacaine infiltrated along the entire fifth metatarsal region    Hemostasis: Lidocaine with epinephrine    Specimen: Fifth ray amputation for pathology.  Deep wound culture for Gram stain    Complications: None    Operative indications:[name] [name] is an 89 y.o. that presents today for surgical intervention.  The risks/benefits/alternatives/complications have been discussed and consent has been given.    Procedure:  Patient was brought into the OR and placed on the operating table in thelateral position with the left side.. After anesthesia was obtained theleft lower extremity was prepped and draped in usual sterile fashion.  Attention was directed laterally along the entire fifth metatarsal.  A large necrotic wound was noted with exposed bone to the entire fifth metatarsal base area.  The necrotic tissue measured 7 cm x 4 cm.  Full-thickness incision was taken down to the level of the fifth metatarsal.  The incision was taken circumferential around the distal fifth toe.  At this time the entire fifth metatarsal including the fifth toe were removed from the surgical field in toto.  The wound was then flushed.  Gentle debridement of the skin edges were performed with a Versajet.  Due to the size of the necrotic tissue attempts at wound closure were not successful.  The proximal and distal aspect of the incision was closed with a 6-1/2 x 3-1/2 cm wound was left.  At this time a wound VAC was applied to the lateral aspect of the left foot.  This was set at 125 mmHg continuous.  A bulky padded bandage was  then applied around the entire left foot and ankle.    Patient tolerated the procedure and anesthesia well.  Was transported from the OR to the PACU with all vital signs stable and vascular status intact. To be discharged per routine protocol.  Will follow up in approximately 1 week in the outpatient clinic.

## 2022-08-14 ENCOUNTER — Encounter: Payer: Self-pay | Admitting: Podiatry

## 2022-08-14 DIAGNOSIS — I10 Essential (primary) hypertension: Secondary | ICD-10-CM | POA: Diagnosis not present

## 2022-08-14 DIAGNOSIS — Z515 Encounter for palliative care: Secondary | ICD-10-CM | POA: Diagnosis not present

## 2022-08-14 DIAGNOSIS — L089 Local infection of the skin and subcutaneous tissue, unspecified: Secondary | ICD-10-CM | POA: Diagnosis not present

## 2022-08-14 DIAGNOSIS — E11628 Type 2 diabetes mellitus with other skin complications: Secondary | ICD-10-CM | POA: Diagnosis not present

## 2022-08-14 DIAGNOSIS — Z7189 Other specified counseling: Secondary | ICD-10-CM | POA: Diagnosis not present

## 2022-08-14 DIAGNOSIS — L03116 Cellulitis of left lower limb: Secondary | ICD-10-CM | POA: Diagnosis not present

## 2022-08-14 DIAGNOSIS — M86172 Other acute osteomyelitis, left ankle and foot: Secondary | ICD-10-CM | POA: Diagnosis not present

## 2022-08-14 LAB — CBC
HCT: 24.9 % — ABNORMAL LOW (ref 36.0–46.0)
Hemoglobin: 8 g/dL — ABNORMAL LOW (ref 12.0–15.0)
MCH: 30.9 pg (ref 26.0–34.0)
MCHC: 32.1 g/dL (ref 30.0–36.0)
MCV: 96.1 fL (ref 80.0–100.0)
Platelets: 265 10*3/uL (ref 150–400)
RBC: 2.59 MIL/uL — ABNORMAL LOW (ref 3.87–5.11)
RDW: 13.2 % (ref 11.5–15.5)
WBC: 14.1 10*3/uL — ABNORMAL HIGH (ref 4.0–10.5)
nRBC: 0 % (ref 0.0–0.2)

## 2022-08-14 LAB — GLUCOSE, CAPILLARY
Glucose-Capillary: 109 mg/dL — ABNORMAL HIGH (ref 70–99)
Glucose-Capillary: 143 mg/dL — ABNORMAL HIGH (ref 70–99)
Glucose-Capillary: 156 mg/dL — ABNORMAL HIGH (ref 70–99)
Glucose-Capillary: 145 mg/dL — ABNORMAL HIGH (ref 70–99)

## 2022-08-14 LAB — BASIC METABOLIC PANEL
Anion gap: 11 (ref 5–15)
BUN: 32 mg/dL — ABNORMAL HIGH (ref 8–23)
CO2: 20 mmol/L — ABNORMAL LOW (ref 22–32)
Calcium: 8.2 mg/dL — ABNORMAL LOW (ref 8.9–10.3)
Chloride: 102 mmol/L (ref 98–111)
Creatinine, Ser: 1.91 mg/dL — ABNORMAL HIGH (ref 0.44–1.00)
GFR, Estimated: 25 mL/min — ABNORMAL LOW (ref >60)
Glucose, Bld: 110 mg/dL — ABNORMAL HIGH (ref 70–99)
Potassium: 3.6 mmol/L (ref 3.5–5.1)
Sodium: 133 mmol/L — ABNORMAL LOW (ref 135–145)

## 2022-08-14 MED ORDER — ORAL CARE MOUTH RINSE: 15.0000 mL | OROMUCOSAL | Status: DC | PRN

## 2022-08-14 MED ORDER — OXYCODONE HCL 5 MG PO TABS
5.0000 mg | ORAL_TABLET | Freq: Four times a day (QID) | ORAL | Status: DC | PRN
  Administered 2022-08-15 – 2022-08-17 (×3): 5 mg via ORAL
  Filled 2022-08-14 (×3): qty 1

## 2022-08-14 MED ORDER — ENOXAPARIN SODIUM 30 MG/0.3ML IJ SOSY
30.0000 mg | PREFILLED_SYRINGE | INTRAMUSCULAR | Status: DC
  Administered 2022-08-15: 30 mg via SUBCUTANEOUS
  Filled 2022-08-14: qty 0.3

## 2022-08-14 MED ORDER — SODIUM CHLORIDE 0.9 % IV SOLN: INTRAVENOUS | Status: AC

## 2022-08-14 NOTE — Progress Notes (Signed)
Palliative:   [name] is resting quietly in bed.  She is resting comfortably.  She greets me, making and somewhat keeping eye contact.  She is alert and, although she has memory loss, she is oriented to person and situation/place.  I believe that she can make her basic needs known.  There is no family at bedside at this time.  She tells me that she is having some discomfort with her foot.  We talk about a "treatment" for her foot yesterday.  Overall she is calm and cooperative, not fearful.  She was able to take several sips of soda at the bedside without overt signs and symptoms of aspiration.  Conference with attending, podiatry, bedside nursing staff, transition of care team related to patient condition, needs, goals of care, disposition.  Plan: At this point continue to treat the treatable but no CPR or intubation.  Anticipate return to long-term care at [facility] when stable.  May be "skillable/rehab".  DSS guardianship court case 7/9 or 10 per son.  50 minutes  [name], NP  Palliative medicine team Team phone [phone] Greater than 50% of this time was spent counseling and coordinating care related to the above assessment and plan.

## 2022-08-14 NOTE — Progress Notes (Addendum)
Progress Note   Patient: [name] UJW:[id number] DOB: 04/25/1933 DOA: 08/07/2022     7 DOS: the patient was seen and examined on 08/14/2022   Brief hospital course: [name] is a 89 y.o. female with medical history significant of unspecified dementia, type 2 diabetes, diabetic polyneuropathy, essential hypertension and dyslipidemia who was sent over by wound care for worsening left foot ulcer with infection. CT scan showed significant cellulitis and concern for fifth metatarsal osteomyelitis.  Patient was started on Zosyn and vancomycin. Patient is evaluated by vascular surgery and podiatry.    7/1: S/p LLE angio and successful recanalization of the left lower extremity for limb salvage 7/2: Palliative care consult.  Podiatry follow-up to decide next types on debridement versus amputation 7/3: Fifth ray amputation left foot & Wound VAC placement left lateral foot  7/4: Antibiotic transition to oral Augmentin  Principal Problem:   Diabetic foot infection (HCC) Active Problems:   Type 2 diabetes mellitus with polyneuropathy (HCC)   Mixed hyperlipidemia   Essential hypertension   Malnutrition of moderate degree   Left hemiparesis (HCC)   Cellulitis of left foot   Right hemiparesis (HCC)   PAD (peripheral artery disease) (HCC)   Hemiplegia affecting left side in left-dominant patient as late effect of cerebrovascular disease (HCC)   Acute osteomyelitis of left foot (HCC)   Assessment and Plan: Diabetic foot infection. Left foot cellulitis with osteomyelitis. Peripheral arterial disease. CT scan with contrast showed evidence of osteomyelitis, could not get MRI due to metal object in the eye. Antibiotics transitioned from IV vancomycin and Zosyn to oral Augmentin today Status post left lower extremity angiogram and successful recanalization of the left lower extremity for limb salvage S/p Fifth ray amputation left foot & Wound VAC placement left lateral foot on 7/3    History of stroke with left hemiparesis. Dementia without behavioral disturbance. Continue home medicines.  Requires Hoyer lift for transfer  AKI Could be due to contrast injury, vancomycin related.  Continue holding lisinopril and hydrochlorothiazide.  Start IV hydration Lab Results  Component Value Date   CREATININE 1.91 (H) 08/14/2022   CREATININE 0.89 08/13/2022   CREATININE 0.65 08/12/2022      Essential hypertension, Dyslipidemia. Holding lisinopril and HCTZ.   Type 2 diabetes with polyneuropathy. Continue sliding scale insulin.   Anemia. Normal iron level, B12 level borderline at 350, started on B12, homocystine level pending.     Subjective:  Sitting in the chair.  Pain well-controlled.  Son at bedside  Physical Exam: Vitals:   08/13/22 1701 08/13/22 2007 08/14/22 0346 08/14/22 0735  BP: (!) 116/52 (!) 122/55 (!) 144/68 (!) 137/59  Pulse: 71 77 96 95  Resp: 17 18 20 17   Temp: 97.6 F (36.4 C) 98.8 F (37.1 C) 97.7 F (36.5 C) 98.3 F (36.8 C)  TempSrc:    Oral  SpO2: (!) 85% 100% 100% 96%  Weight:      Height:       General exam: Appears calm and comfortable  Respiratory system: Clear to auscultation. Respiratory effort normal. Cardiovascular system: S1 & S2 heard, RRR. No JVD, murmurs, rubs, gallops or clicks. No pedal edema. Gastrointestinal system: Abdomen is nondistended, soft and nontender. No organomegaly or masses felt. Normal bowel sounds heard. Central nervous system: Alert and oriented x2.  Left hemiplegia Extremities/skin left hemiplegia.  Dressing on the left foot and ankle with wound VAC in place draining bloody drainage Psychiatry: Mood & affect appropriate.    Data Reviewed:  Results reviewed.  Family Communication: Son updated at bedside  Disposition: Status is: Inpatient Remains inpatient appropriate because: Management of osteomyelitis and wound    DVT prophylaxis-Lovenox Time spent: 35 minutes  Author: Delfino Lovett,  MD 08/14/2022 2:10 PM  For on call review [email or web address].

## 2022-08-14 NOTE — TOC Transition Note (Signed)
Transition of Care [facility]) - CM/SW Discharge Note   Patient Details  Name: [name] MRN: [id number] Date of Birth: November 07, 1933  Transition of Care [facility]) CM/SW Contact:  Garret Reddish, RN Phone Number: 08/14/2022, 4:29 PM   Clinical Narrative:   Chart reviewed.  Noted that patient will be stable for discharge on tomorrow. Podiatry reports that patient will need a wound vac on discharge.    I have informed [name] with [facility] that patient will need a wound vac at the facility.  [name] reports that she will order wound vac on tomorrow.  [name] reports that the facility should be able to receive the wound vac on tomorrow.  [name] has also requested that SNF authorization be obtained.    I have submitted for SNF authorization.  SNF authorization pending.  Pending [facility] ID is [id number].    TOC will continue to follow for discharge planning.       Barriers to Discharge: Continued Medical Work up   Patient Goals and CMS Choice      Discharge Placement                         Discharge Plan and Services Additional resources added to the After Visit Summary for                                       Social Determinants of Health (SDOH) Interventions SDOH Screenings   Tobacco Use: Low Risk  (08/14/2022)     Readmission Risk Interventions     No data to display

## 2022-08-14 NOTE — TOC Progression Note (Signed)
Transition of Care [facility]) - Progression Note    Patient Details  Name: [name] MRN: [id number] Date of Birth: December 20, 1933  Transition of Care [facility]) CM/SW Contact  Garret Reddish, RN Phone Number: 08/14/2022, 11:33 AM  Clinical Narrative:   Chart reviewed.  Noted that patient had Excision of 5th ray on 08-13-2022.  Podiatry continues to follow patient.  Patient is a long-term resident of [facility] and plan is for patient to return to [facility] when stable for discharge.    TOC will continue to follow progress of patient.     Expected Discharge Plan: Skilled Nursing Facility Barriers to Discharge: Continued Medical Work up  Expected Discharge Plan and Services       Living arrangements for the past 2 months: Skilled Nursing Facility                                       Social Determinants of Health (SDOH) Interventions SDOH Screenings   Tobacco Use: Low Risk  (08/14/2022)    Readmission Risk Interventions     No data to display

## 2022-08-15 DIAGNOSIS — M86172 Other acute osteomyelitis, left ankle and foot: Secondary | ICD-10-CM | POA: Diagnosis not present

## 2022-08-15 DIAGNOSIS — Z7189 Other specified counseling: Secondary | ICD-10-CM | POA: Diagnosis not present

## 2022-08-15 DIAGNOSIS — I10 Essential (primary) hypertension: Secondary | ICD-10-CM | POA: Diagnosis not present

## 2022-08-15 DIAGNOSIS — L089 Local infection of the skin and subcutaneous tissue, unspecified: Secondary | ICD-10-CM | POA: Diagnosis not present

## 2022-08-15 DIAGNOSIS — E11628 Type 2 diabetes mellitus with other skin complications: Secondary | ICD-10-CM | POA: Diagnosis not present

## 2022-08-15 DIAGNOSIS — Z515 Encounter for palliative care: Secondary | ICD-10-CM | POA: Diagnosis not present

## 2022-08-15 DIAGNOSIS — L03116 Cellulitis of left lower limb: Secondary | ICD-10-CM | POA: Diagnosis not present

## 2022-08-15 LAB — BASIC METABOLIC PANEL WITH GFR
Anion gap: 10 (ref 5–15)
BUN: 30 mg/dL — ABNORMAL HIGH (ref 8–23)
CO2: 21 mmol/L — ABNORMAL LOW (ref 22–32)
Calcium: 8.2 mg/dL — ABNORMAL LOW (ref 8.9–10.3)
Chloride: 106 mmol/L (ref 98–111)
Creatinine, Ser: 1.68 mg/dL — ABNORMAL HIGH (ref 0.44–1.00)
GFR, Estimated: 29 mL/min — ABNORMAL LOW
Glucose, Bld: 125 mg/dL — ABNORMAL HIGH (ref 70–99)
Potassium: 3.2 mmol/L — ABNORMAL LOW (ref 3.5–5.1)
Sodium: 137 mmol/L (ref 135–145)

## 2022-08-15 LAB — CBC
HCT: 23.4 % — ABNORMAL LOW (ref 36.0–46.0)
Hemoglobin: 7.5 g/dL — ABNORMAL LOW (ref 12.0–15.0)
MCH: 30.5 pg (ref 26.0–34.0)
MCHC: 32.1 g/dL (ref 30.0–36.0)
MCV: 95.1 fL (ref 80.0–100.0)
Platelets: 281 10*3/uL (ref 150–400)
RBC: 2.46 MIL/uL — ABNORMAL LOW (ref 3.87–5.11)
RDW: 13.2 % (ref 11.5–15.5)
WBC: 11.2 10*3/uL — ABNORMAL HIGH (ref 4.0–10.5)
nRBC: 0 % (ref 0.0–0.2)

## 2022-08-15 LAB — AEROBIC/ANAEROBIC CULTURE W GRAM STAIN (SURGICAL/DEEP WOUND)

## 2022-08-15 LAB — CREATININE, SERUM
Creatinine, Ser: 1.63 mg/dL — ABNORMAL HIGH (ref 0.44–1.00)
GFR, Estimated: 31 mL/min — ABNORMAL LOW

## 2022-08-15 LAB — GLUCOSE, CAPILLARY
Glucose-Capillary: 129 mg/dL — ABNORMAL HIGH (ref 70–99)
Glucose-Capillary: 133 mg/dL — ABNORMAL HIGH (ref 70–99)
Glucose-Capillary: 144 mg/dL — ABNORMAL HIGH (ref 70–99)
Glucose-Capillary: 126 mg/dL — ABNORMAL HIGH (ref 70–99)

## 2022-08-15 MED ORDER — POTASSIUM CHLORIDE CRYS ER 20 MEQ PO TBCR
40.0000 meq | EXTENDED_RELEASE_TABLET | Freq: Once | ORAL | Status: AC
  Administered 2022-08-15: 40 meq via ORAL
  Filled 2022-08-15: qty 2

## 2022-08-15 MED ORDER — SODIUM CHLORIDE 0.9 % IV SOLN: INTRAVENOUS | Status: AC

## 2022-08-15 NOTE — Care Management Important Message (Signed)
Important Message  Patient Details  Name: [name] MRN: [id number] Date of Birth: 1933/04/02   Medicare Important Message Given:  Yes     [name] A Zayra Devito 08/15/2022, 10:26 AM

## 2022-08-15 NOTE — Progress Notes (Signed)
Was made aware of strikethrough bleeding on dressing.  New dressing applied.  Surgicel packing within wound for hemostasis.  Bulky sterile dressing was applied with gentle compression with an Ace wrap.  Continue to reinforce as needed.

## 2022-08-15 NOTE — TOC Progression Note (Signed)
Transition of Care [facility]) - Progression Note    Patient Details  Name: [name] MRN: [id number] Date of Birth: 09/07/1933  Transition of Care [facility]) CM/SW Contact  Marlowe Sax, RN Phone Number: 08/15/2022, 8:45 AM  Clinical Narrative:    Ins pending   Expected Discharge Plan: Skilled Nursing Facility Barriers to Discharge: Continued Medical Work up  Expected Discharge Plan and Services       Living arrangements for the past 2 months: Skilled Nursing Facility                                       Social Determinants of Health (SDOH) Interventions SDOH Screenings   Tobacco Use: Low Risk  (08/14/2022)    Readmission Risk Interventions     No data to display

## 2022-08-15 NOTE — Progress Notes (Signed)
Palliative: [name], [name], is resting quietly in bed.  She is resting comfortably, but wakes easily when I enter.  She will make an somewhat keep eye contact.  Although she has known dementia she is oriented to person and place.  She denies pain at this time stating she is just tired.  I believe that she is able to make her basic needs known.  There is no family at bedside at this time.  I offer her water which she is able to drink without overt signs and symptoms of aspiration.  I offer her a bite of cookie which she is able to eat without issue.  Walking by the room later in the morning to find podiatry at bedside along with bedside nursing staff providing dressing change.  Face-to-face conference with transition of care team related to patient condition, disposition.  Call to son, [name], for update.  We talk about [name] wound and the treatment plan.  We talk about postop day 2.  We talk about wound VAC and discharged to [facility] for continued care.  We talk about [name] by mouth intake.  [name] states that she ate about half of her breakfast for him he got a cookie for her which she ate.  I shared that she ate more of the cookie for me.  [name] states that he spoke with the physician this morning who discussed hospice care with him.  He states that he knows that she is nearing the end of her life.  We talked about outpatient palliative services to start as she would not be able to take hospice while she is being skilled/[facility] rehab [facility]  He is agreeable to outpatient palliative services.  Provider choice offered.  [facility] provider of choice.  Conference with attending, podiatry, bedside nursing staff, transition of care team related to patient condition, needs, goals of care, disposition.  Plan:  At this point continue to treat the treatable but no CPR or intubation.  Return to [facility] with skilled needs/rehab needs.  Outpatient palliative services with  [facility]. DNR/goldenrod form completed and placed on chart.  50 minutes  [name] Carmel, NP Palliative medicine team Team phone [phone] Greater 50% of this time was spent counseling and coordinating care related to the above assessment and plan.

## 2022-08-15 NOTE — Progress Notes (Signed)
Progress Note   Patient: [name] ZOX:[id number] DOB: July 25, 1933 DOA: 08/07/2022     8 DOS: the patient was seen and examined on 08/15/2022   Brief hospital course: [name] is a 89 y.o. female with medical history significant of unspecified dementia, type 2 diabetes, diabetic polyneuropathy, essential hypertension and dyslipidemia who was sent over by wound care for worsening left foot ulcer with infection. CT scan showed significant cellulitis and concern for fifth metatarsal osteomyelitis.  Patient was started on Zosyn and vancomycin. Patient is evaluated by vascular surgery and podiatry.    7/1: S/p LLE angio and successful recanalization of the left lower extremity for limb salvage 7/2: Palliative care consult.  Podiatry follow-up to decide next types on debridement versus amputation 7/3: Fifth ray amputation left foot & Wound VAC placement left lateral foot  7/4: Antibiotic transition to oral Augmentin 7/5: Dressing changed by podiatry  Principal Problem:   Diabetic foot infection (HCC) Active Problems:   Type 2 diabetes mellitus with polyneuropathy (HCC)   Mixed hyperlipidemia   Essential hypertension   Malnutrition of moderate degree   Left hemiparesis (HCC)   Cellulitis of left foot   Right hemiparesis (HCC)   PAD (peripheral artery disease) (HCC)   Hemiplegia affecting left side in left-dominant patient as late effect of cerebrovascular disease (HCC)   Acute osteomyelitis of left foot (HCC)   Assessment and Plan: Diabetic foot infection. Left foot cellulitis with osteomyelitis. Peripheral arterial disease. CT scan with contrast showed evidence of osteomyelitis, could not get MRI due to metal object in the eye. Antibiotics transitioned from IV vancomycin and Zosyn to oral Augmentin  Status post left lower extremity angiogram and successful recanalization of the left lower extremity for limb salvage S/p Fifth ray amputation left foot & Wound VAC placement  left lateral foot on 7/3 Dressing was blood soaked - new dressing applied with Surgicel packing for hemostasis, bulky sterile dressing with gentle compression of Ace wrap and reinforce dressing in place   History of stroke with left hemiparesis. Dementia without behavioral disturbance. Continue home medicines.  Requires Hoyer lift for transfer  AKI Could be due to contrast injury, vancomycin related.  Continue holding lisinopril and hydrochlorothiazide.  Improving with IV hydration Lab Results  Component Value Date   CREATININE 1.63 (H) 08/15/2022   CREATININE 1.68 (H) 08/15/2022   CREATININE 1.91 (H) 08/14/2022      Essential hypertension, Dyslipidemia. Holding lisinopril and HCTZ.   Type 2 diabetes with polyneuropathy. Continue sliding scale insulin.   Anemia. Normal iron level, B12 level borderline at 350, started on B12, hemoglobin 7.5 likely due to some bleeding at the surgical site.  Pressure dressing applied     Subjective:  Hemoglobin dropped at 7.5.  Denies any new complaint.  Physical Exam: Vitals:   08/14/22 1531 08/15/22 0010 08/15/22 0830 08/15/22 1536  BP: (!) 140/97 (!) 143/87 (!) 154/66 (!) 141/50  Pulse: 94 (!) 102 78 83  Resp: 17 18 16 18   Temp: 98.4 F (36.9 C) 98.2 F (36.8 C) 97.8 F (36.6 C) 98.6 F (37 C)  TempSrc:   Oral   SpO2: 95% 100% 99% 99%  Weight:      Height:       General exam: Appears calm and comfortable  Respiratory system: Clear to auscultation. Respiratory effort normal. Cardiovascular system: S1 & S2 heard, RRR. No JVD, murmurs, rubs, gallops or clicks. No pedal edema. Gastrointestinal system: Abdomen is nondistended, soft and nontender. No organomegaly or masses  felt. Normal bowel sounds heard. Central nervous system: Alert and oriented x2.  Left hemiplegia Extremities/skin left hemiplegia.  Dressing on the left foot and ankle with wound VAC in place draining bloody drainage Psychiatry: Mood & affect appropriate.    Data  Reviewed:  Results reviewed.  Family Communication: None at bedside  Disposition: Status is: Inpatient Remains inpatient appropriate because: Management of osteomyelitis and wound    DVT prophylaxis-Lovenox Time spent: 35 minutes  Author: Delfino Lovett, MD 08/15/2022 4:33 PM  For on call review [email or web address].

## 2022-08-15 NOTE — Progress Notes (Addendum)
Daily Progress Note   Subjective  - 2 Days Post-Op  Left fifth ray amputation with wound VAC placement.  Objective Vitals:   08/14/22 0735 08/14/22 1531 08/15/22 0010 08/15/22 0830  BP: (!) 137/59 (!) 140/97 (!) 143/87 (!) 154/66  Pulse: 95 94 (!) 102 78  Resp: 17 17 18 16   Temp: 98.3 F (36.8 C) 98.4 F (36.9 C) 98.2 F (36.8 C) 97.8 F (36.6 C)  TempSrc: Oral   Oral  SpO2: 96% 95% 100% 99%  Weight:      Height:        Physical Exam: Large amount of clotted blood to the wound VAC.  This was all removed today.  Upon removal of the wound VAC there was still a fair amount of drainage from the wound.  The skin edges were stable.  No necrotic tissue.  The distal aspect of the left great toe from the interphalangeal joint distally looks to be gangrenous at this time.  No signs of infection.  Reviewed micro that had no organisms.  Few white blood cells seen.  Reintubated for further growth.  Laboratory CBC    Component Value Date/Time   WBC 11.2 (H) 08/15/2022 0405   HGB 7.5 (L) 08/15/2022 0405   HGB 8.6 (L) 09/25/2013 0410   HCT 23.4 (L) 08/15/2022 0405   HCT 27.0 (L) 09/24/2013 0409   PLT 281 08/15/2022 0405   PLT 192 09/24/2013 0409    BMET    Component Value Date/Time   NA 137 08/15/2022 0405   NA 143 09/24/2013 0409   K 3.2 (L) 08/15/2022 0405   K 3.6 09/24/2013 0409   CL 106 08/15/2022 0405   CL 108 (H) 09/24/2013 0409   CO2 21 (L) 08/15/2022 0405   CO2 27 09/24/2013 0409   GLUCOSE 125 (H) 08/15/2022 0405   GLUCOSE 222 (H) 09/24/2013 0409   BUN 30 (H) 08/15/2022 0405   BUN 7 09/24/2013 0409   CREATININE 1.68 (H) 08/15/2022 0405   CREATININE 0.77 09/24/2013 0409   CALCIUM 8.2 (L) 08/15/2022 0405   CALCIUM 7.7 (L) 09/24/2013 0409   GFRNONAA 29 (L) 08/15/2022 0405   GFRNONAA >60 09/24/2013 0409   GFRAA >60 09/26/2019 1952   GFRAA >60 09/24/2013 0409    Assessment/Planning: Osteomyelitis left fifth metatarsal status post fifth ray amputation Gangrene  left great toe Diabetes with peripheral vascular disease  Get a hold on the wound VAC with the amount of drainage from the wound I do not think this will be effective at this time.  Once we can get the drainage to decrease and virtually stop we will reinstitute the wound VAC therapy at that time.  Dry gauze bandage was applied by myself today. Discussed with palliative medicine today.  She was seen. We will continue to follow for now. Large wound with necrotic tissue on the distal great toe.  Poor prognosis at this time.   [name] A  08/15/2022, 11:55 AM

## 2022-08-15 NOTE — TOC Progression Note (Signed)
Transition of Care [facility]) - Progression Note    Patient Details  Name: [name] MRN: [id number] Date of Birth: 1933-05-30  Transition of Care [facility]) CM/SW Contact  Marlowe Sax, RN Phone Number: 08/15/2022, 2:17 PM  Clinical Narrative:    [name] stated that the patient is a DSS patient and they did not do a pBed Hold, I checked again and [facility] is still Pending, I reached out ti [name] to confirm that they will accept the patient once Ins approves, Awaiting a response   Expected Discharge Plan: Skilled Nursing Facility Barriers to Discharge: Continued Medical Work up  Expected Discharge Plan and Services       Living arrangements for the past 2 months: Skilled Nursing Facility                                       Social Determinants of Health (SDOH) Interventions SDOH Screenings   Tobacco Use: Low Risk  (08/14/2022)    Readmission Risk Interventions     No data to display

## 2022-08-15 NOTE — Consult Note (Signed)
[facility] CM Inpatient Consult  [facility]) Accountable Care Organization (ACO) [facility] Liaison Note  08/15/2022  PREEYA KOELBL 1933/11/06 [id number]  Location: [facility] Liaison for coverage for Elliot Cousin, RN  screened the patient remotely at [facility].  Insurance: [facility] HMO Medicare   KERIE HOCKLEY is a 89 y.o. female who is a Primary Care Patient of Gracelyn Nurse, MD. The patient was screened for length of stay  day 7 readmission hospitalization with noted medium risk score for unplanned readmission risk.  The patient was assessed for potential [facility]) Care Management service needs for post hospital transition for care coordination. Review of patient's electronic medical record reveals patient is for continued medical work up for Wound VAC for potential SNF for post hospital level of care. Review of inpatient [facility] team notes reveals patient is a DSS pt.  Referral request for community care coordination: pending disposition. If patient returns to a SNF level of care then post hospital needs are to be met at that level of care for transition.   [facility] Care Management/Population Health does not replace or interfere with any arrangements made by the Inpatient Transition of Care team.   For questions contact:   Charlesetta Shanks, RN BSN CCM [facility]  [phone] business mobile phone Toll free office [phone]  *Concierge Line  [phone] Fax number: [phone] [email or web address]

## 2022-08-16 DIAGNOSIS — E440 Moderate protein-calorie malnutrition: Secondary | ICD-10-CM

## 2022-08-16 DIAGNOSIS — E11628 Type 2 diabetes mellitus with other skin complications: Secondary | ICD-10-CM | POA: Diagnosis not present

## 2022-08-16 DIAGNOSIS — M86172 Other acute osteomyelitis, left ankle and foot: Secondary | ICD-10-CM | POA: Diagnosis not present

## 2022-08-16 DIAGNOSIS — G8191 Hemiplegia, unspecified affecting right dominant side: Secondary | ICD-10-CM

## 2022-08-16 DIAGNOSIS — M869 Osteomyelitis, unspecified: Secondary | ICD-10-CM

## 2022-08-16 LAB — CBC
HCT: 21.2 % — ABNORMAL LOW (ref 36.0–46.0)
Hemoglobin: 6.6 g/dL — ABNORMAL LOW (ref 12.0–15.0)
MCH: 30.3 pg (ref 26.0–34.0)
MCHC: 31.1 g/dL (ref 30.0–36.0)
MCV: 97.2 fL (ref 80.0–100.0)
Platelets: 260 10*3/uL (ref 150–400)
RBC: 2.18 MIL/uL — ABNORMAL LOW (ref 3.87–5.11)
RDW: 13.4 % (ref 11.5–15.5)
WBC: 9.6 10*3/uL (ref 4.0–10.5)
nRBC: 0 % (ref 0.0–0.2)

## 2022-08-16 LAB — BASIC METABOLIC PANEL
Anion gap: 10 (ref 5–15)
BUN: 27 mg/dL — ABNORMAL HIGH (ref 8–23)
CO2: 21 mmol/L — ABNORMAL LOW (ref 22–32)
Calcium: 8.2 mg/dL — ABNORMAL LOW (ref 8.9–10.3)
Chloride: 109 mmol/L (ref 98–111)
Creatinine, Ser: 1.41 mg/dL — ABNORMAL HIGH (ref 0.44–1.00)
GFR, Estimated: 36 mL/min — ABNORMAL LOW (ref >60)
Glucose, Bld: 143 mg/dL — ABNORMAL HIGH (ref 70–99)
Potassium: 3.9 mmol/L (ref 3.5–5.1)
Sodium: 140 mmol/L (ref 135–145)

## 2022-08-16 LAB — GLUCOSE, CAPILLARY
Glucose-Capillary: 143 mg/dL — ABNORMAL HIGH (ref 70–99)
Glucose-Capillary: 153 mg/dL — ABNORMAL HIGH (ref 70–99)
Glucose-Capillary: 87 mg/dL (ref 70–99)

## 2022-08-16 LAB — ABO/RH: ABO/RH(D): A POS

## 2022-08-16 LAB — PREPARE RBC (CROSSMATCH)

## 2022-08-16 LAB — TYPE AND SCREEN

## 2022-08-16 LAB — AEROBIC/ANAEROBIC CULTURE W GRAM STAIN (SURGICAL/DEEP WOUND)

## 2022-08-16 MED ORDER — SODIUM CHLORIDE 0.9% IV SOLUTION: Freq: Once | INTRAVENOUS | Status: AC

## 2022-08-16 NOTE — Progress Notes (Signed)
Follow-up left foot wound from fifth ray excision and debridement.  She had a fair amount of bleeding yesterday.  Eventually had to packed the wound with Surgicel and a compression bandage  Her hemoglobin has dropped to 6.6.  Dressing removed today.  No active bleeding today.  The wound was cleansed.  This was repacked and a bulky padded sterile dressing was applied today.  Will follow-up tomorrow and as long as no residual active bleeding will likely reinstitute the wound VAC at that time.

## 2022-08-16 NOTE — Progress Notes (Signed)
Progress Note   Patient: [name]:[id number] DOB: [birth year] DOA: 08/07/2022     9 DOS: the patient was seen and examined on 08/16/2022   Brief hospital course: [name] is a 89 y.o. female with medical history significant of unspecified dementia, type 2 diabetes, diabetic polyneuropathy, essential hypertension and dyslipidemia who was sent over by wound care for worsening left foot ulcer with infection. CT scan showed significant cellulitis and concern for fifth metatarsal osteomyelitis.  Patient was started on Zosyn and vancomycin. Patient is evaluated by vascular surgery and podiatry.    7/1: S/p LLE angio and successful recanalization of the left lower extremity for limb salvage 7/2: Palliative care consult.  Podiatry follow-up to decide next types on debridement versus amputation 7/3: Fifth ray amputation left foot & Wound VAC placement left lateral foot  7/4: Antibiotic transition to oral Augmentin 7/5: Dressing changed by podiatry 7/6: Waiting for placement  Principal Problem:   Diabetic foot infection (HCC) Active Problems:   Type 2 diabetes mellitus with polyneuropathy (HCC)   Mixed hyperlipidemia   Essential hypertension   Malnutrition of moderate degree   Left hemiparesis (HCC)   Cellulitis of left foot   Right hemiparesis (HCC)   PAD (peripheral artery disease) (HCC)   Hemiplegia affecting left side in left-dominant patient as late effect of cerebrovascular disease (HCC)   Acute osteomyelitis of left foot (HCC)   Assessment and Plan: Diabetic foot infection. Left foot cellulitis with osteomyelitis. Peripheral arterial disease. CT scan with contrast showed evidence of osteomyelitis, could not get MRI due to metal object in the eye. Antibiotics transitioned from IV vancomycin and Zosyn to oral Augmentin  Status post left lower extremity angiogram and successful recanalization of the left lower extremity for limb salvage S/p Fifth ray amputation left  foot & Wound VAC placement left lateral foot on 7/3 Dressing was blood soaked - new dressing applied with Surgicel packing for hemostasis, bulky sterile dressing with gentle compression of Ace wrap and reinforce dressing in place   History of stroke with left hemiparesis. Dementia without behavioral disturbance. Continue home medicines.  Requires Hoyer lift for transfer  AKI Could be due to contrast injury, vancomycin related.  Continue holding lisinopril and hydrochlorothiazide.  Improving with IV hydration Lab Results  Component Value Date   CREATININE 1.41 (H) 08/16/2022   CREATININE 1.63 (H) 08/15/2022   CREATININE 1.68 (H) 08/15/2022      Essential hypertension, Dyslipidemia. Holding lisinopril and HCTZ.   Type 2 diabetes with polyneuropathy. Continue sliding scale insulin.   Acute on chronic blood loss anemia. Normal iron level, B12 level borderline at 350, started on B12, hemoglobin 6.8 likely due to some bleeding at the surgical site.  Dressing changed yesterday by podiatry Will order 1 PRBC transfusion today.  Will hold Lovenox     Subjective:  Hemoglobin dropped at 6.6.  Feeling very tired  Physical Exam: Vitals:   08/15/22 0830 08/15/22 1536 08/15/22 2303 08/16/22 0740  BP: (!) 154/66 (!) 141/50 (!) 144/64 (!) 160/64  Pulse: 78 83 98 81  Resp: 16 18 18 19   Temp: 97.8 F (36.6 C) 98.6 F (37 C) 98.9 F (37.2 C) (!) 97.5 F (36.4 C)  TempSrc: Oral   Oral  SpO2: 99% 99% 100% 100%  Weight:      Height:       General exam: Appears calm and comfortable.  She looks paler Respiratory system: Clear to auscultation. Respiratory effort normal. Cardiovascular system: S1 & S2 heard,  RRR. No JVD, murmurs, rubs, gallops or clicks. No pedal edema. Gastrointestinal system: Abdomen is nondistended, soft and nontender. No organomegaly or masses felt. Normal bowel sounds heard. Central nervous system: Alert and oriented x2.  Left hemiplegia Extremities/skin left  hemiplegia.  Dressing on the left foot and ankle with wound VAC in place draining bloody drainage Psychiatry: Mood & affect appropriate.    Data Reviewed:  Results reviewed.  Family Communication: None at bedside  Disposition: Status is: Inpatient Remains inpatient appropriate because: Management of osteomyelitis and wound    DVT prophylaxis-SCDs Time spent: 35 minutes  Author: Delfino Lovett, MD 08/16/2022 10:17 AM  For on call review [email or web address].

## 2022-08-16 NOTE — Progress Notes (Signed)
Visited with [name] at her bedside. No acute complaints. Ordered to receive blood transfusion. Goals remain clear, no acute palliative needs at this time.  No Charge.  Leeanne Deed, DNP, AGNP-C Palliative Medicine  Please call Palliative Medicine team phone with any questions [phone]. For individual providers please see AMION.

## 2022-08-16 NOTE — TOC Progression Note (Signed)
Transition of Care [facility]) - Progression Note    Patient Details  Name: [name] MRN: [id number] Date of Birth: February 01, 1934  Transition of Care [facility]) CM/SW Contact  Bing Quarry, RN Phone Number: 08/16/2022, 12:40 PM  Clinical Narrative:  7/6: Insurance authorization remains pending as of 1230 pm today, 08/16/22.  Gabriel Cirri MSN RN CM  Transitions of Care Department [facility] [phone] Weekends Only      Expected Discharge Plan: Skilled Nursing Facility Barriers to Discharge: Continued Medical Work up  Expected Discharge Plan and Services       Living arrangements for the past 2 months: Skilled Nursing Facility                                       Social Determinants of Health (SDOH) Interventions SDOH Screenings   Tobacco Use: Low Risk  (08/14/2022)    Readmission Risk Interventions     No data to display

## 2022-08-17 DIAGNOSIS — I10 Essential (primary) hypertension: Secondary | ICD-10-CM | POA: Diagnosis not present

## 2022-08-17 DIAGNOSIS — M86172 Other acute osteomyelitis, left ankle and foot: Secondary | ICD-10-CM | POA: Diagnosis not present

## 2022-08-17 DIAGNOSIS — E11628 Type 2 diabetes mellitus with other skin complications: Secondary | ICD-10-CM | POA: Diagnosis not present

## 2022-08-17 DIAGNOSIS — L03116 Cellulitis of left lower limb: Secondary | ICD-10-CM | POA: Diagnosis not present

## 2022-08-17 LAB — GLUCOSE, CAPILLARY
Glucose-Capillary: 122 mg/dL — ABNORMAL HIGH (ref 70–99)
Glucose-Capillary: 153 mg/dL — ABNORMAL HIGH (ref 70–99)
Glucose-Capillary: 121 mg/dL — ABNORMAL HIGH (ref 70–99)
Glucose-Capillary: 84 mg/dL (ref 70–99)

## 2022-08-17 LAB — CBC
HCT: 24.2 % — ABNORMAL LOW (ref 36.0–46.0)
Hemoglobin: 7.8 g/dL — ABNORMAL LOW (ref 12.0–15.0)
MCH: 29.5 pg (ref 26.0–34.0)
MCHC: 32.2 g/dL (ref 30.0–36.0)
MCV: 91.7 fL (ref 80.0–100.0)
Platelets: 264 10*3/uL (ref 150–400)
RBC: 2.64 MIL/uL — ABNORMAL LOW (ref 3.87–5.11)
RDW: 16.1 % — ABNORMAL HIGH (ref 11.5–15.5)
WBC: 8.5 10*3/uL (ref 4.0–10.5)
nRBC: 0 % (ref 0.0–0.2)

## 2022-08-17 LAB — BASIC METABOLIC PANEL
Anion gap: 8 (ref 5–15)
BUN: 18 mg/dL (ref 8–23)
CO2: 21 mmol/L — ABNORMAL LOW (ref 22–32)
Calcium: 8.1 mg/dL — ABNORMAL LOW (ref 8.9–10.3)
Chloride: 110 mmol/L (ref 98–111)
Creatinine, Ser: 1.19 mg/dL — ABNORMAL HIGH (ref 0.44–1.00)
GFR, Estimated: 45 mL/min — ABNORMAL LOW (ref >60)
Glucose, Bld: 126 mg/dL — ABNORMAL HIGH (ref 70–99)
Potassium: 3.9 mmol/L (ref 3.5–5.1)
Sodium: 139 mmol/L (ref 135–145)

## 2022-08-17 LAB — TYPE AND SCREEN
ABO/RH(D): A POS
Antibody Screen: NEGATIVE
Unit division: 0

## 2022-08-17 LAB — BPAM RBC
Blood Product Expiration Date: 202408032359
ISSUE DATE / TIME: 202407061432
Unit Type and Rh: 6200

## 2022-08-17 MED ORDER — OXYCODONE HCL 5 MG PO TABS
5.0000 mg | ORAL_TABLET | Freq: Three times a day (TID) | ORAL | Status: DC | PRN
  Administered 2022-08-21 – 2022-08-22 (×2): 5 mg via ORAL
  Filled 2022-08-17 (×2): qty 1

## 2022-08-17 NOTE — Progress Notes (Signed)
1 PRBC transfusion 1 packed red blood cell transfusion Progress Note   Patient: [name] ZOX:[id number] DOB: Nov 27, 1933 DOA: 08/07/2022     10 DOS: the patient was seen and examined on 08/17/2022   Brief hospital course: EIMAN EHRGOTT is a 89 y.o. female with medical history significant of unspecified dementia, type 2 diabetes, diabetic polyneuropathy, essential hypertension and dyslipidemia who was sent over by wound care for worsening left foot ulcer with infection. CT scan showed significant cellulitis and concern for fifth metatarsal osteomyelitis.  Patient was started on Zosyn and vancomycin. Patient is evaluated by vascular surgery and podiatry.    7/1: S/p LLE angio and successful recanalization of the left lower extremity for limb salvage 7/2: Palliative care consult.  Podiatry follow-up to decide next types on debridement versus amputation 7/3: Fifth ray amputation left foot & Wound VAC placement left lateral foot  7/4: Antibiotic transition to oral Augmentin 7/5: Dressing changed by podiatry 7/6: 1 PRBC transfusion 7/7: Wound VAC applied.  Waiting for SNF placement  Principal Problem:   Diabetic foot infection (HCC) Active Problems:   Type 2 diabetes mellitus with polyneuropathy (HCC)   Mixed hyperlipidemia   Essential hypertension   Malnutrition of moderate degree   Left hemiparesis (HCC)   Cellulitis of left foot   Right hemiparesis (HCC)   PAD (peripheral artery disease) (HCC)   Hemiplegia affecting left side in left-dominant patient as late effect of cerebrovascular disease (HCC)   Acute osteomyelitis of left foot (HCC)   Assessment and Plan: Diabetic foot infection. Left foot cellulitis with osteomyelitis. Peripheral arterial disease. CT scan with contrast showed evidence of osteomyelitis, could not get MRI due to metal object in the eye. Antibiotics transitioned from IV vancomycin and Zosyn to oral Augmentin  Status post left lower extremity angiogram  and successful recanalization of the left lower extremity for limb salvage S/p Fifth ray amputation left foot & Wound VAC placement left lateral foot on 7/3 Wound VAC applied by podiatry today.  No active bleeding at this time.   History of stroke with left hemiparesis. Dementia without behavioral disturbance. Continue home medicines.  Requires Hoyer lift for transfer  AKI Could be due to contrast injury, vancomycin related.  Continue holding lisinopril and hydrochlorothiazide.  Improved with IV hydration Lab Results  Component Value Date   CREATININE 1.19 (H) 08/17/2022   CREATININE 1.41 (H) 08/16/2022   CREATININE 1.63 (H) 08/15/2022      Essential hypertension, Dyslipidemia. Holding lisinopril and HCTZ.   Type 2 diabetes with polyneuropathy. Continue sliding scale insulin.   Acute on chronic blood loss anemia. Normal iron level, B12 level borderline at 350, started on B12, hemoglobin 6.8 likely due to some bleeding at the surgical site.  Dressing changed yesterday by podiatry Status post 1 PRBC transfusion on 7/6.  Will hold Lovenox, continue SCDs     Subjective:  Feeling much better.  Wound VAC applied by podiatry earlier  Physical Exam: Vitals:   08/16/22 1730 08/16/22 1737 08/16/22 2333 08/17/22 0718  BP: (!) 150/54 (!) 150/54 (!) 162/66 (!) 145/67  Pulse: 78 79 86 79  Resp: 18 16 18 17   Temp: 98 F (36.7 C) 98.2 F (36.8 C) 98.2 F (36.8 C) 98.6 F (37 C)  TempSrc: Oral Oral  Oral  SpO2: 99% 100% 100% 100%  Weight:      Height:       General exam: Appears calm and comfortable.  She looks paler Respiratory system: Clear to auscultation. Respiratory  effort normal. Cardiovascular system: S1 & S2 heard, RRR. No JVD, murmurs, rubs, gallops or clicks. No pedal edema. Gastrointestinal system: Abdomen is nondistended, soft and nontender. No organomegaly or masses felt. Normal bowel sounds heard. Central nervous system: Alert and oriented x2.  Left  hemiplegia Extremities/skin left hemiplegia.  Dressing on the left foot and ankle with wound VAC in place draining bloody drainage, wound VAC in place Psychiatry: Mood & affect appropriate.    Data Reviewed:  Results reviewed.  Family Communication: None at bedside  Disposition: Status is: Inpatient Remains inpatient appropriate because: Management of osteomyelitis and wound    DVT prophylaxis-SCDs Time spent: 35 minutes  Author: Delfino Lovett, MD 08/17/2022 1:05 PM  For on call review [email or web address].

## 2022-08-17 NOTE — Progress Notes (Signed)
Daily Progress Note   Subjective  - 4 Days Post-Op  Follow-up left lateral foot ulceration.  Patient resting comfortably.  Objective Vitals:   08/16/22 1730 08/16/22 1737 08/16/22 2333 08/17/22 0718  BP: (!) 150/54 (!) 150/54 (!) 162/66 (!) 145/67  Pulse: 78 79 86 79  Resp: 18 16 18 17   Temp: 98 F (36.7 C) 98.2 F (36.8 C) 98.2 F (36.8 C) 98.6 F (37 C)  TempSrc: Oral Oral  Oral  SpO2: 99% 100% 100% 100%  Weight:      Height:        Physical Exam: Dressing removed.  No active bleeding at this point.  Wound VAC applied.  Laboratory CBC    Component Value Date/Time   WBC 8.5 08/17/2022 0729   HGB 7.8 (L) 08/17/2022 0729   HGB 8.6 (L) 09/25/2013 0410   HCT 24.2 (L) 08/17/2022 0729   HCT 27.0 (L) 09/24/2013 0409   PLT 264 08/17/2022 0729   PLT 192 09/24/2013 0409    BMET    Component Value Date/Time   NA 139 08/17/2022 0729   NA 143 09/24/2013 0409   K 3.9 08/17/2022 0729   K 3.6 09/24/2013 0409   CL 110 08/17/2022 0729   CL 108 (H) 09/24/2013 0409   CO2 21 (L) 08/17/2022 0729   CO2 27 09/24/2013 0409   GLUCOSE 126 (H) 08/17/2022 0729   GLUCOSE 222 (H) 09/24/2013 0409   BUN 18 08/17/2022 0729   BUN 7 09/24/2013 0409   CREATININE 1.19 (H) 08/17/2022 0729   CREATININE 0.77 09/24/2013 0409   CALCIUM 8.1 (L) 08/17/2022 0729   CALCIUM 7.7 (L) 09/24/2013 0409   GFRNONAA 45 (L) 08/17/2022 0729   GFRNONAA >60 09/24/2013 0409   GFRAA >60 09/26/2019 1952   GFRAA >60 09/24/2013 0409    Assessment/Planning: Status post excision fifth ray for osteomyelitis  Wound VAC applied.  Good seal noted.  No active bleeding at this time.  From podiatry standpoint patient is stable for discharge.  Will need wound VAC changes performed at SNF 3 times a week.  Follow-up with podiatry in 3-4 weeks in outpatient clinic   Gwyneth Revels A  08/17/2022, 9:25 AM

## 2022-08-18 DIAGNOSIS — E11628 Type 2 diabetes mellitus with other skin complications: Secondary | ICD-10-CM | POA: Diagnosis not present

## 2022-08-18 DIAGNOSIS — I10 Essential (primary) hypertension: Secondary | ICD-10-CM | POA: Diagnosis not present

## 2022-08-18 DIAGNOSIS — L03116 Cellulitis of left lower limb: Secondary | ICD-10-CM | POA: Diagnosis not present

## 2022-08-18 DIAGNOSIS — M86172 Other acute osteomyelitis, left ankle and foot: Secondary | ICD-10-CM | POA: Diagnosis not present

## 2022-08-18 LAB — BASIC METABOLIC PANEL
Anion gap: 9 (ref 5–15)
BUN: 18 mg/dL (ref 8–23)
CO2: 21 mmol/L — ABNORMAL LOW (ref 22–32)
Calcium: 8 mg/dL — ABNORMAL LOW (ref 8.9–10.3)
Chloride: 109 mmol/L (ref 98–111)
Creatinine, Ser: 1.31 mg/dL — ABNORMAL HIGH (ref 0.44–1.00)
GFR, Estimated: 40 mL/min — ABNORMAL LOW (ref >60)
Glucose, Bld: 78 mg/dL (ref 70–99)
Potassium: 3.7 mmol/L (ref 3.5–5.1)
Sodium: 139 mmol/L (ref 135–145)

## 2022-08-18 LAB — GLUCOSE, CAPILLARY
Glucose-Capillary: 86 mg/dL (ref 70–99)
Glucose-Capillary: 124 mg/dL — ABNORMAL HIGH (ref 70–99)
Glucose-Capillary: 100 mg/dL — ABNORMAL HIGH (ref 70–99)
Glucose-Capillary: 157 mg/dL — ABNORMAL HIGH (ref 70–99)

## 2022-08-18 LAB — CBC
HCT: 24.6 % — ABNORMAL LOW (ref 36.0–46.0)
Hemoglobin: 7.9 g/dL — ABNORMAL LOW (ref 12.0–15.0)
MCH: 30.4 pg (ref 26.0–34.0)
MCHC: 32.1 g/dL (ref 30.0–36.0)
MCV: 94.6 fL (ref 80.0–100.0)
Platelets: 248 10*3/uL (ref 150–400)
RBC: 2.6 MIL/uL — ABNORMAL LOW (ref 3.87–5.11)
RDW: 15.5 % (ref 11.5–15.5)
WBC: 7.7 10*3/uL (ref 4.0–10.5)
nRBC: 0 % (ref 0.0–0.2)

## 2022-08-18 LAB — AEROBIC/ANAEROBIC CULTURE W GRAM STAIN (SURGICAL/DEEP WOUND)

## 2022-08-18 NOTE — Progress Notes (Signed)
   08/18/22 1500  Spiritual Encounters  Type of Visit Initial  Care provided to: Patient  Referral source Chaplain assessment  Reason for visit Routine spiritual support  OnCall Visit No   Chaplain visited patient to offer spiritual support. Patient is resting. Chaplain spiritual support services remain available as the need arises.

## 2022-08-18 NOTE — Plan of Care (Signed)
  Problem: Health Behavior/Discharge Planning: Goal: Ability to manage health-related needs will improve Outcome: Progressing   Problem: Clinical Measurements: Goal: Diagnostic test results will improve Outcome: Progressing   Problem: Activity: Goal: Risk for activity intolerance will decrease Outcome: Progressing   Problem: Nutrition: Goal: Adequate nutrition will be maintained Outcome: Progressing   Problem: Elimination: Goal: Will not experience complications related to bowel motility Outcome: Progressing Goal: Will not experience complications related to urinary retention Outcome: Progressing   Problem: Pain Managment: Goal: General experience of comfort will improve Outcome: Progressing   

## 2022-08-18 NOTE — Care Management Important Message (Signed)
Important Message  Patient Details  Name: [name] MRN: [id number] Date of Birth: Nov 19, 1933   Medicare Important Message Given:  Yes     [name] A Shaquill Iseman 08/18/2022, 12:19 PM

## 2022-08-18 NOTE — TOC Progression Note (Signed)
Transition of Care [facility]) - Progression Note    Patient Details  Name: [name] MRN: [id number] Date of Birth: 07/12/1933  Transition of Care [facility]) CM/SW Contact  Garret Reddish, RN Phone Number: 08/18/2022, 8:39 PM  Clinical Narrative:   Received call from [facility] that SNF was denied.  Navi reports that patient/family can complete a fast appeal.  The number to call for a fast appeal is [phone].   I have informed [name] with [facility].  She informs me that she has contacted [facility] Marrow with DSS.  She reports that DSS is patient's guardian and is paying for her stay at the facility. [name] reports that DSS will have to make a satisfactory arrangement for patient to be able to return to the facility.  I have called and [name] with DSS and also emailed Mrs. Marrow to inform her of the above information.  [name] with [facility] has also reached out to Mrs. Marrow and awaiting  a response.  I have also emailed Mrs. Marrow and made her aware of the Fast Appeal if they would like to pursue this option.    TOC will continue to follow for discharge planning.      TOC will continue to follow for discharge planning.      Expected Discharge Plan: Skilled Nursing Facility Barriers to Discharge: Continued Medical Work up  Expected Discharge Plan and Services       Living arrangements for the past 2 months: Skilled Nursing Facility                                       Social Determinants of Health (SDOH) Interventions SDOH Screenings   Tobacco Use: Low Risk  (08/14/2022)    Readmission Risk Interventions     No data to display

## 2022-08-18 NOTE — Progress Notes (Signed)
1 PRBC transfusion 1 packed red blood cell transfusion Progress Note   Patient: [name] ZOX:[id number] DOB: Jun 10, 1933 DOA: 08/07/2022     11 DOS: the patient was seen and examined on 08/18/2022   Brief hospital course: [name] is a 89 y.o. female with medical history significant of unspecified dementia, type 2 diabetes, diabetic polyneuropathy, essential hypertension and dyslipidemia who was sent over by wound care for worsening left foot ulcer with infection. CT scan showed significant cellulitis and concern for fifth metatarsal osteomyelitis.  Patient was started on Zosyn and vancomycin. Patient is evaluated by vascular surgery and podiatry.    7/1: S/p LLE angio and successful recanalization of the left lower extremity for limb salvage 7/2: Palliative care consult.  Podiatry follow-up to decide next types on debridement versus amputation 7/3: Fifth ray amputation left foot & Wound VAC placement left lateral foot  7/4: Antibiotic transition to oral Augmentin 7/5: Dressing changed by podiatry 7/6: 1 PRBC transfusion 7/7: Wound VAC applied.  Waiting for SNF placement 7/8: Waiting on placement  Principal Problem:   Diabetic foot infection (HCC) Active Problems:   Type 2 diabetes mellitus with polyneuropathy (HCC)   Mixed hyperlipidemia   Essential hypertension   Malnutrition of moderate degree   Left hemiparesis (HCC)   Cellulitis of left foot   Right hemiparesis (HCC)   PAD (peripheral artery disease) (HCC)   Hemiplegia affecting left side in left-dominant patient as late effect of cerebrovascular disease (HCC)   Acute osteomyelitis of left foot (HCC)   Assessment and Plan: Diabetic foot infection. Left foot cellulitis with osteomyelitis. Peripheral arterial disease. CT scan with contrast showed evidence of osteomyelitis, could not get MRI due to metal object in the eye. Antibiotics transitioned from IV vancomycin and Zosyn to oral Augmentin  Status post left  lower extremity angiogram and successful recanalization of the left lower extremity for limb salvage S/p Fifth ray amputation left foot & Wound VAC placement left lateral foot on 7/3 Wound VAC applied by podiatry on 7/8.  No active bleeding at this time.   History of stroke with left hemiparesis. Dementia without behavioral disturbance. Continue home medicines.  Requires Hoyer lift for transfer  AKI Could be due to contrast injury, vancomycin related.  Continue holding lisinopril and hydrochlorothiazide.  Improved with IV hydration Lab Results  Component Value Date   CREATININE 1.31 (H) 08/18/2022   CREATININE 1.19 (H) 08/17/2022   CREATININE 1.41 (H) 08/16/2022      Essential hypertension, Dyslipidemia. Holding lisinopril and HCTZ.   Type 2 diabetes with polyneuropathy. Continue sliding scale insulin.   Acute on chronic blood loss anemia. Normal iron level, B12 level borderline at 350, started on B12, hemoglobin 6.8 likely due to some bleeding at the surgical site.  Dressing changed yesterday by podiatry Status post 1 PRBC transfusion on 7/6.  Will hold Lovenox, continue SCDs     Subjective:  Feeling much better.  No new issues  Physical Exam: Vitals:   08/16/22 1737 08/16/22 2333 08/17/22 0718 08/18/22 0010  BP: (!) 150/54 (!) 162/66 (!) 145/67 (!) 152/66  Pulse: 79 86 79 77  Resp: 16 18 17 16   Temp: 98.2 F (36.8 C) 98.2 F (36.8 C) 98.6 F (37 C) 98.1 F (36.7 C)  TempSrc: Oral  Oral   SpO2: 100% 100% 100% 100%  Weight:      Height:       General exam: Appears calm and comfortable.  She looks paler Respiratory system: Clear to  auscultation. Respiratory effort normal. Cardiovascular system: S1 & S2 heard, RRR. No JVD, murmurs, rubs, gallops or clicks. No pedal edema. Gastrointestinal system: Abdomen is nondistended, soft and nontender. No organomegaly or masses felt. Normal bowel sounds heard. Central nervous system: Alert and oriented x2.  Left  hemiplegia Extremities/skin left hemiplegia.  Dressing on the left foot and ankle with wound VAC in place draining bloody drainage, wound VAC in place Psychiatry: Mood & affect appropriate.    Data Reviewed:  Results reviewed.  Family Communication: None at bedside  Disposition: Status is: Inpatient Remains inpatient appropriate because: Management of osteomyelitis and wound    DVT prophylaxis-SCDs Time spent: 35 minutes  Author: Delfino Lovett, MD 08/18/2022 11:08 AM  For on call review [email or web address].

## 2022-08-18 NOTE — Plan of Care (Signed)

## 2022-08-19 DIAGNOSIS — L03116 Cellulitis of left lower limb: Secondary | ICD-10-CM | POA: Diagnosis not present

## 2022-08-19 DIAGNOSIS — M86172 Other acute osteomyelitis, left ankle and foot: Secondary | ICD-10-CM | POA: Diagnosis not present

## 2022-08-19 DIAGNOSIS — E11628 Type 2 diabetes mellitus with other skin complications: Secondary | ICD-10-CM | POA: Diagnosis not present

## 2022-08-19 DIAGNOSIS — I10 Essential (primary) hypertension: Secondary | ICD-10-CM | POA: Diagnosis not present

## 2022-08-19 LAB — GLUCOSE, CAPILLARY
Glucose-Capillary: 149 mg/dL — ABNORMAL HIGH (ref 70–99)
Glucose-Capillary: 154 mg/dL — ABNORMAL HIGH (ref 70–99)
Glucose-Capillary: 114 mg/dL — ABNORMAL HIGH (ref 70–99)
Glucose-Capillary: 184 mg/dL — ABNORMAL HIGH (ref 70–99)

## 2022-08-19 NOTE — Care Management Important Message (Signed)
Important Message  Patient Details  Name: [name] MRN: [id number] Date of Birth: Jul 03, 1933   Medicare Important Message Given:  Yes  IM given 08/18/22 @ 12:19 PM  But doesn't show up in the IM column.    [name] A Dezra Mandella 08/19/2022, 8:35 AM

## 2022-08-19 NOTE — Plan of Care (Signed)
  Problem: Clinical Measurements: Goal: Diagnostic test results will improve Outcome: Progressing   Problem: Activity: Goal: Risk for activity intolerance will decrease Outcome: Progressing   Problem: Nutrition: Goal: Adequate nutrition will be maintained Outcome: Progressing   Problem: Elimination: Goal: Will not experience complications related to bowel motility Outcome: Progressing Goal: Will not experience complications related to urinary retention Outcome: Progressing   Problem: Pain Managment: Goal: General experience of comfort will improve Outcome: Progressing   

## 2022-08-19 NOTE — TOC Progression Note (Signed)
Transition of Care [facility]) - Progression Note    Patient Details  Name: [name] MRN: [id number] Date of Birth: 03/25/1933  [facility] CM/SW Contact  Garret Reddish, RN Phone Number: 08/19/2022, 1:31 PM  Clinical Narrative:   I spoke to [facility] today. Patient has no payor at this time. DSS is in the process of obtaining Medicaid for [name]. DSS is paying for the patient to stay at the facility until [facility] can be obtained. They are waiting on payment from DSS to take the patient. DSS  representative Milinda Pointer,  spoke with [facility] yesterday and are working on getting the payment to the facility.   DSS has been made aware by me and [facility] that patient is ready for discharge.   TOC will continue to follow for discharge planning.     Expected Discharge Plan: Skilled Nursing Facility Barriers to Discharge: Continued Medical Work up  Expected Discharge Plan and Services       Living arrangements for the past 2 months: Skilled Nursing Facility                                       Social Determinants of Health (SDOH) Interventions SDOH Screenings   Tobacco Use: Low Risk  (08/14/2022)    Readmission Risk Interventions     No data to display

## 2022-08-19 NOTE — Progress Notes (Signed)
   08/19/22 1500  Spiritual Encounters  Type of Visit Follow up  Care provided to: Patient  Referral source Patient request  Reason for visit Routine spiritual support  OnCall Visit No  Spiritual Framework  Presenting Themes Impactful experiences and emotions  Patient Stress Factors Health changes  Interventions  Spiritual Care Interventions Made Established relationship of care and support;Compassionate presence;Reflective listening;Encouragement  Intervention Outcomes  Outcomes Awareness of support;Awareness of health  Spiritual Care Plan  Spiritual Care Issues Still Outstanding No further spiritual care needs at this time (see row info)   Chaplain visited patient providing empathetic listening and compassionate care. Chaplain spiritual support remain available as the need arises.

## 2022-08-19 NOTE — Progress Notes (Signed)
1 PRBC transfusion 1 packed red blood cell transfusion Progress Note   Patient: [name] UJW:[id number] DOB: Jun 23, 1933 DOA: 08/07/2022     12 DOS: the patient was seen and examined on 08/19/2022   Brief hospital course: [name] is a 89 y.o. female with medical history significant of unspecified dementia, type 2 diabetes, diabetic polyneuropathy, essential hypertension and dyslipidemia who was sent over by wound care for worsening left foot ulcer with infection. CT scan showed significant cellulitis and concern for fifth metatarsal osteomyelitis.  Patient was started on Zosyn and vancomycin. Patient is evaluated by vascular surgery and podiatry.    7/1: S/p LLE angio and successful recanalization of the left lower extremity for limb salvage 7/2: Palliative care consult.  Podiatry follow-up to decide next types on debridement versus amputation 7/3: Fifth ray amputation left foot & Wound VAC placement left lateral foot  7/4: Antibiotic transition to oral Augmentin 7/5: Dressing changed by podiatry 7/6: 1 PRBC transfusion 7/7: Wound VAC applied.  Waiting for SNF placement 7/8-7/9: Waiting on placement, unfortunately there is some pending man from DSS to the facility which is delaying placement  Principal Problem:   Diabetic foot infection (HCC) Active Problems:   Type 2 diabetes mellitus with polyneuropathy (HCC)   Mixed hyperlipidemia   Essential hypertension   Malnutrition of moderate degree   Left hemiparesis (HCC)   Cellulitis of left foot   Right hemiparesis (HCC)   PAD (peripheral artery disease) (HCC)   Hemiplegia affecting left side in left-dominant patient as late effect of cerebrovascular disease (HCC)   Acute osteomyelitis of left foot (HCC)   Assessment and Plan: Diabetic foot infection. Left foot cellulitis with osteomyelitis. Peripheral arterial disease. CT scan with contrast showed evidence of osteomyelitis, could not get MRI due to metal object in the  eye. Antibiotics transitioned from IV vancomycin and Zosyn to oral Augmentin  Status post left lower extremity angiogram and successful recanalization of the left lower extremity for limb salvage S/p Fifth ray amputation left foot & Wound VAC placement left lateral foot on 7/3 Wound VAC applied by podiatry on 7/8.  No active bleeding at this time.   History of stroke with left hemiparesis. Dementia without behavioral disturbance. Continue home medicines.  Requires Hoyer lift for transfer  AKI Could be due to contrast injury, vancomycin related.  Continue holding lisinopril and hydrochlorothiazide.  Improved with IV hydration Lab Results  Component Value Date   CREATININE 1.31 (H) 08/18/2022   CREATININE 1.19 (H) 08/17/2022   CREATININE 1.41 (H) 08/16/2022      Essential hypertension, Dyslipidemia. Holding lisinopril and HCTZ.  Consider resumption if her kidney function normalizes   Type 2 diabetes with polyneuropathy. Continue sliding scale insulin.   Acute on chronic blood loss anemia. Normal iron level, B12 level borderline at 350, started on B12, hemoglobin 6.8 likely due to some bleeding at the surgical site.  Dressing changed yesterday by podiatry Status post 1 PRBC transfusion on 7/6.  Will hold Lovenox, continue SCDs     Subjective:  Feeling much better.  No new issues  Physical Exam: Vitals:   08/18/22 0010 08/18/22 1503 08/18/22 2332 08/19/22 0736  BP: (!) 152/66 (!) 162/66 134/63 (!) 165/62  Pulse: 77 71 79 67  Resp: 16 16 20 17   Temp: 98.1 F (36.7 C) 98.1 F (36.7 C) 98.7 F (37.1 C) 97.9 F (36.6 C)  TempSrc:      SpO2: 100% 100% 100% 100%  Weight:  Height:       General exam: Appears calm and comfortable.  She looks paler Respiratory system: Clear to auscultation. Respiratory effort normal. Cardiovascular system: S1 & S2 heard, RRR. No JVD, murmurs, rubs, gallops or clicks. No pedal edema. Gastrointestinal system: Abdomen is nondistended, soft  and nontender. No organomegaly or masses felt. Normal bowel sounds heard. Central nervous system: Alert and oriented x2.  Left hemiplegia Extremities/skin left hemiplegia.  Dressing on the left foot and ankle with wound VAC in place draining bloody drainage, wound VAC in place Psychiatry: Mood & affect appropriate.    Data Reviewed:  Results reviewed.  Family Communication: None at bedside  Disposition: Status is: Inpatient Remains inpatient appropriate because: Management of osteomyelitis and wound    DVT prophylaxis-SCDs Time spent: 35 minutes  Author: Delfino Lovett, MD 08/19/2022 3:03 PM  For on call review [email or web address].

## 2022-08-19 NOTE — Plan of Care (Signed)

## 2022-08-20 DIAGNOSIS — L089 Local infection of the skin and subcutaneous tissue, unspecified: Secondary | ICD-10-CM | POA: Diagnosis not present

## 2022-08-20 DIAGNOSIS — M86172 Other acute osteomyelitis, left ankle and foot: Secondary | ICD-10-CM | POA: Diagnosis not present

## 2022-08-20 DIAGNOSIS — G8194 Hemiplegia, unspecified affecting left nondominant side: Secondary | ICD-10-CM | POA: Diagnosis not present

## 2022-08-20 DIAGNOSIS — E11628 Type 2 diabetes mellitus with other skin complications: Secondary | ICD-10-CM | POA: Diagnosis not present

## 2022-08-20 LAB — GLUCOSE, CAPILLARY
Glucose-Capillary: 103 mg/dL — ABNORMAL HIGH (ref 70–99)
Glucose-Capillary: 180 mg/dL — ABNORMAL HIGH (ref 70–99)
Glucose-Capillary: 127 mg/dL — ABNORMAL HIGH (ref 70–99)
Glucose-Capillary: 124 mg/dL — ABNORMAL HIGH (ref 70–99)

## 2022-08-20 LAB — BASIC METABOLIC PANEL
Anion gap: 9 (ref 5–15)
BUN: 17 mg/dL (ref 8–23)
CO2: 23 mmol/L (ref 22–32)
Calcium: 8.1 mg/dL — ABNORMAL LOW (ref 8.9–10.3)
Chloride: 110 mmol/L (ref 98–111)
Creatinine, Ser: 0.92 mg/dL (ref 0.44–1.00)
GFR, Estimated: >60 mL/min (ref >60)
Glucose, Bld: 120 mg/dL — ABNORMAL HIGH (ref 70–99)
Potassium: 3.2 mmol/L — ABNORMAL LOW (ref 3.5–5.1)
Sodium: 142 mmol/L (ref 135–145)

## 2022-08-20 LAB — CBC
HCT: 24.8 % — ABNORMAL LOW (ref 36.0–46.0)
Hemoglobin: 8 g/dL — ABNORMAL LOW (ref 12.0–15.0)
MCH: 30 pg (ref 26.0–34.0)
MCHC: 32.3 g/dL (ref 30.0–36.0)
MCV: 92.9 fL (ref 80.0–100.0)
Platelets: 293 10*3/uL (ref 150–400)
RBC: 2.67 MIL/uL — ABNORMAL LOW (ref 3.87–5.11)
RDW: 14.8 % (ref 11.5–15.5)
WBC: 7.4 10*3/uL (ref 4.0–10.5)
nRBC: 0 % (ref 0.0–0.2)

## 2022-08-20 MED ORDER — POTASSIUM CHLORIDE CRYS ER 20 MEQ PO TBCR
40.0000 meq | EXTENDED_RELEASE_TABLET | ORAL | Status: AC
  Administered 2022-08-20 (×2): 40 meq via ORAL
  Filled 2022-08-20 (×2): qty 2

## 2022-08-20 MED ORDER — AMOXICILLIN 500 MG PO CAPS
500.0000 mg | ORAL_CAPSULE | Freq: Three times a day (TID) | ORAL | Status: DC
  Administered 2022-08-20 – 2022-08-22 (×5): 500 mg via ORAL
  Filled 2022-08-20 (×6): qty 1

## 2022-08-20 NOTE — Plan of Care (Signed)
  Problem: Health Behavior/Discharge Planning: Goal: Ability to manage health-related needs will improve Outcome: Progressing   Problem: Clinical Measurements: Goal: Will remain free from infection Outcome: Progressing   Problem: Clinical Measurements: Goal: Respiratory complications will improve Outcome: Progressing   Problem: Clinical Measurements: Goal: Cardiovascular complication will be avoided Outcome: Progressing   Problem: Safety: Goal: Ability to remain free from injury will improve Outcome: Progressing   

## 2022-08-20 NOTE — Consult Note (Signed)
WOC Nurse Consult Note: Reason for Consult:Change NPWT dressing change Wound type:surgical, infectious Pressure Injury POA:N/A  Discussed with Dr. Ether Griffins (Podiatric Medicine) via Secure Chat the schedule for NPWT dressing change. Order received for twice weekly dressing changes on Mondays and Thursdays beginning tomorrow. WOC nursing to change dressing.  WOC nursing team will follow while in house, and will remain available to this patient, the nursing and medical teams.   Thank you for inviting [place] to participate in this patient's Plan of Care.  Ladona Mow, MSN, RN, CNS, GNP, Leda Min, [employer], [employer] phone:  [phone]

## 2022-08-20 NOTE — Progress Notes (Signed)
Mobility Specialist - Progress Note   08/20/22 1034  Mobility  Activity Turned to right side;Turned to left side;Turned to back - supine;Transferred from bed to chair  Level of Assistance +2 (takes two people)  Assistive Device MaxiMove Water quality scientist lift)  Activity Response Tolerated well  $Mobility charge 1 Mobility  Mobility Specialist Start Time (ACUTE ONLY) [id number]  Mobility Specialist Stop Time (ACUTE ONLY) 1028  Mobility Specialist Time Calculation (min) (ACUTE ONLY) 30 min   Pt supine upon entry, utilizing RA. Pt agreeable to OOB transfer to the recliner this date. Pt turns left and right while MS placed sling under Pt. Pt transferred to the recliner via hoyer lift +2 for safety, tolerated well. Pt left seated in the recliner with alarm set and needs within reach.   Zetta Bills Mobility Specialist 08/20/22 10:40 AM

## 2022-08-20 NOTE — Progress Notes (Addendum)
Progress Note   Patient: [name] ZOX:[id number] DOB: 28-Dec-1933 DOA: 08/07/2022     13 DOS: the patient was seen and examined on 08/20/2022   Brief hospital course: JUNA CABAN is a 89 y.o. female with medical history significant of unspecified dementia, type 2 diabetes, diabetic polyneuropathy, essential hypertension and dyslipidemia who was sent over by wound care for worsening left foot ulcer with infection. CT scan showed significant cellulitis and concern for fifth metatarsal osteomyelitis.  Patient was started on Zosyn and vancomycin. 7/1: S/p LLE angio and successful recanalization of the left lower extremity for limb salvage 7/3: Fifth ray amputation left foot & Wound VAC placement left lateral foot  Patient currently pending nursing home placement.    Principal Problem:   Diabetic foot infection (HCC) Active Problems:   Type 2 diabetes mellitus with polyneuropathy (HCC)   Mixed hyperlipidemia   Essential hypertension   Malnutrition of moderate degree   Left hemiparesis (HCC)   Cellulitis of left foot   Hypokalemia   Right hemiparesis (HCC)   PAD (peripheral artery disease) (HCC)   Hemiplegia affecting left side in left-dominant patient as late effect of cerebrovascular disease (HCC)   Acute osteomyelitis of left foot (HCC)   Assessment and Plan:  Diabetic foot infection. Left foot cellulitis with osteomyelitis. Peripheral arterial disease. CT scan with contrast showed evidence of osteomyelitis, could not get MRI due to metal object in the eye. Antibiotics transitioned from IV vancomycin and Zosyn. Status post left lower extremity angiogram and successful recanalization of the left lower extremity for limb salvage S/p Fifth ray amputation left foot & Wound VAC placement left lateral foot on 7/3 Condition improving, antibiotic switched to amoxicillin for additional 2 weeks.  Discussed with podiatry.   History of stroke with left hemiparesis. Dementia  without behavioral disturbance. Continue home medicines.  Requires Hoyer lift for transfer   AKI Hypokalemia. Give additional dose of potassium, renal function normalized.   Essential hypertension, Dyslipidemia. Holding lisinopril and HCTZ.   Type 2 diabetes with polyneuropathy. Continue sliding scale insulin.   Acute on chronic blood loss anemia. Normal iron level, B12 level borderline at 350, started on B12, homocystine level was normal.  Patient is currently on B12 orally, needed at discharge.  Continue to monitor hemoglobin and transfuse as needed.    Subjective:  Patient doing well, baseline confusion.  Physical Exam: Vitals:   08/19/22 0736 08/19/22 2320 08/20/22 0100 08/20/22 0723  BP: (!) 165/62 (!) 165/65 (!) 154/60 (!) 158/57  Pulse: 67 71 70 (!) 58  Resp: 17 20  16   Temp: 97.9 F (36.6 C) 98.2 F (36.8 C)  98.1 F (36.7 C)  TempSrc:      SpO2: 100% 100%  100%  Weight:      Height:       General exam: Appears calm and comfortable  Respiratory system: Clear to auscultation. Respiratory effort normal. Cardiovascular system: S1 & S2 heard, RRR. No JVD, murmurs, rubs, gallops or clicks. No pedal edema. Gastrointestinal system: Abdomen is nondistended, soft and nontender. No organomegaly or masses felt. Normal bowel sounds heard. Central nervous system: Alert and oriented x2.  Hemiparesis of left Extremities: Symmetric 5 x 5 power. Skin: No rashes, lesions or ulcers Psychiatry: Judgement and insight appear normal. Mood & affect appropriate.    Data Reviewed:  Lab results reviewed.  Family Communication: None  Disposition: Status is: Inpatient Remains inpatient appropriate because: Unsafe discharge, pending nursing home placement.     Time spent: 1  minutes  Author: Marrion Coy, MD 08/20/2022 11:35 AM  For on call review [email or web address].

## 2022-08-20 NOTE — Plan of Care (Signed)

## 2022-08-21 DIAGNOSIS — L089 Local infection of the skin and subcutaneous tissue, unspecified: Secondary | ICD-10-CM | POA: Diagnosis not present

## 2022-08-21 DIAGNOSIS — I10 Essential (primary) hypertension: Secondary | ICD-10-CM | POA: Diagnosis not present

## 2022-08-21 DIAGNOSIS — E11628 Type 2 diabetes mellitus with other skin complications: Secondary | ICD-10-CM | POA: Diagnosis not present

## 2022-08-21 DIAGNOSIS — M86172 Other acute osteomyelitis, left ankle and foot: Secondary | ICD-10-CM | POA: Diagnosis not present

## 2022-08-21 LAB — MAGNESIUM: Magnesium: 1.7 mg/dL (ref 1.7–2.4)

## 2022-08-21 LAB — GLUCOSE, CAPILLARY
Glucose-Capillary: 91 mg/dL (ref 70–99)
Glucose-Capillary: 148 mg/dL — ABNORMAL HIGH (ref 70–99)
Glucose-Capillary: 180 mg/dL — ABNORMAL HIGH (ref 70–99)
Glucose-Capillary: 76 mg/dL (ref 70–99)

## 2022-08-21 LAB — BASIC METABOLIC PANEL
Anion gap: 8 (ref 5–15)
BUN: 17 mg/dL (ref 8–23)
CO2: 25 mmol/L (ref 22–32)
Calcium: 8.2 mg/dL — ABNORMAL LOW (ref 8.9–10.3)
Chloride: 111 mmol/L (ref 98–111)
Creatinine, Ser: 0.98 mg/dL (ref 0.44–1.00)
GFR, Estimated: 56 mL/min — ABNORMAL LOW (ref >60)
Glucose, Bld: 94 mg/dL (ref 70–99)
Potassium: 3.7 mmol/L (ref 3.5–5.1)
Sodium: 144 mmol/L (ref 135–145)

## 2022-08-21 LAB — CBC
HCT: 26.6 % — ABNORMAL LOW (ref 36.0–46.0)
Hemoglobin: 8.5 g/dL — ABNORMAL LOW (ref 12.0–15.0)
MCH: 30 pg (ref 26.0–34.0)
MCHC: 32 g/dL (ref 30.0–36.0)
MCV: 94 fL (ref 80.0–100.0)
Platelets: 316 10*3/uL (ref 150–400)
RBC: 2.83 MIL/uL — ABNORMAL LOW (ref 3.87–5.11)
RDW: 14.7 % (ref 11.5–15.5)
WBC: 8.7 10*3/uL (ref 4.0–10.5)
nRBC: 0 % (ref 0.0–0.2)

## 2022-08-21 MED ORDER — AMOXICILLIN 500 MG PO CAPS: 500.0000 mg | ORAL_CAPSULE | Freq: Three times a day (TID) | ORAL | 0 refills | Status: AC

## 2022-08-21 MED ORDER — OXYCODONE HCL 5 MG PO TABS: 5.0000 mg | ORAL_TABLET | Freq: Three times a day (TID) | ORAL | 0 refills | Status: DC | PRN

## 2022-08-21 NOTE — Consult Note (Signed)
WOC Nurse Consult Note: Reason for Consult:NPWT dressing change Wound type:surgical Pressure Injury POA: N/A Measurement:6.5cm x 2.5cm x 1.5cm  Wound bed: deep red, dry Drainage (amount, consistency, odor) none Periwound: intact. Distal 4cm of wound with staples intact Dressing procedure/placement/frequency: Dressing removed and 1 piece of black foam removed without complaint of pain from patient. Periwound protected with skin barrier ring stretched to fit around defect.One piece of black foam cut to fit defect, placed into wound. Drape applied and dressing attached to continuous negative pressure. Patient tolerated procedure well. The dressing is secured and tubing protected using Kerlix roll gauze/paper tape. Foot is placed into Prevalon boot.  Dressing is to be changed twice weekly on Mondays and Thursdays. Next dressing change is Monday, 08/25/22.  Dressing kit in room. WOC nursing will change dressing if patient is still in house.   Dr. Ether Griffins to see patient in his office the week of either July 28 or August 4th.   WOC nursing team will follow while in house, and will remain available to this patient, the nursing and medical teams.    Thank you for inviting [place] to participate in this patient's Plan of Care.  Ladona Mow, MSN, RN, CNS, GNP, Leda Min, [employer], [employer] phone:  [phone]

## 2022-08-21 NOTE — Plan of Care (Signed)

## 2022-08-21 NOTE — Discharge Summary (Signed)
Physician Discharge Summary   Patient: [name] MRN: [id number] DOB: [birth year]  Admit date:     08/07/2022  Discharge date: 08/21/22  Discharge Physician: Marrion Coy   PCP: Gracelyn Nurse, MD   Recommendations at discharge:   Follow-up with PCP in 1 week. Follow-up with podiatry in 2 weeks. Follow-up with vascular surgery in 2 months.  Discharge Diagnoses: Principal Problem:   Diabetic foot infection (HCC) Active Problems:   Type 2 diabetes mellitus with polyneuropathy (HCC)   Mixed hyperlipidemia   Essential hypertension   Malnutrition of moderate degree   Left hemiparesis (HCC)   Cellulitis of left foot   Hypokalemia   Right hemiparesis (HCC)   PAD (peripheral artery disease) (HCC)   Hemiplegia affecting left side in left-dominant patient as late effect of cerebrovascular disease (HCC)   Acute osteomyelitis of left foot (HCC)  Resolved Problems:   * No resolved hospital problems. * Mild metabolic acidosis. Hypokalemia Acute renal failure. Hospital Course: [name] is a 89 y.o. female with medical history significant of unspecified dementia, type 2 diabetes, diabetic polyneuropathy, essential hypertension and dyslipidemia who was sent over by wound care for worsening left foot ulcer with infection. CT scan showed significant cellulitis and concern for fifth metatarsal osteomyelitis.  Patient was started on Zosyn and vancomycin. 7/1: S/p LLE angio and successful recanalization of the left lower extremity for limb salvage 7/3: Fifth ray amputation left foot & Wound VAC placement left lateral foot  Patient currently pending nursing home placement.   Assessment and Plan: Diabetic foot infection. Left foot cellulitis with osteomyelitis. Peripheral arterial disease. CT scan with contrast showed evidence of osteomyelitis, could not get MRI due to metal object in the eye. Antibiotics transitioned from IV vancomycin and Zosyn. Status post left lower  extremity angiogram and successful recanalization of the left lower extremity for limb salvage S/p Fifth ray amputation left foot & Wound VAC placement left lateral foot on 7/3 Condition improving, antibiotic switched to amoxicillin for additional 2 weeks.     History of stroke with left hemiparesis. Dementia without behavioral disturbance. Continue home medicines.  Requires Hoyer lift for transfer   AKI Hypokalemia. Condition improved.   Essential hypertension, Dyslipidemia. Resume lisinopril and statin.   Type 2 diabetes with polyneuropathy. Resume home medicine.   Acute on chronic blood loss anemia. Patient was chronically on iron and B12,  will continue.         Consultants: Podiatry and vascular surgery. Procedures performed: Leg angioplasty, toe amputation. Disposition: Skilled nursing facility Diet recommendation:  Discharge Diet Orders (From admission, onward)     Start     Ordered   08/21/22 0000  Diet - low sodium heart healthy        08/21/22 1146           Cardiac diet DISCHARGE MEDICATION: Allergies as of 08/21/2022       Reactions   Metformin And Related Nausea And Vomiting        Medication List     STOP taking these medications    hydrochlorothiazide 12.5 MG tablet Commonly known as: HYDRODIURIL       TAKE these medications    acetaminophen 325 MG tablet Commonly known as: TYLENOL Take 2 tablets (650 mg total) by mouth every 6 (six) hours as needed for mild pain, moderate pain, fever or headache.   amoxicillin 500 MG capsule Commonly known as: AMOXIL Take 1 capsule (500 mg total) by mouth every 8 (eight) hours for  13 days.   ascorbic acid 500 MG tablet Commonly known as: VITAMIN C Take 500 mg by mouth 2 (two) times daily.   aspirin EC 81 MG tablet Take 81 mg by mouth daily. Swallow whole.   atorvastatin 80 MG tablet Commonly known as: LIPITOR Take 1 tablet (80 mg total) by mouth daily. What changed: when to take this    clopidogrel 75 MG tablet Commonly known as: PLAVIX Take 1 tablet (75 mg total) by mouth daily.   cyanocobalamin 1000 MCG tablet Commonly known as: VITAMIN B12 Take 1,000 mcg by mouth daily.   feeding supplement (PRO-STAT SUGAR FREE 64) Liqd Take 30 mLs by mouth in the morning and at bedtime.   ferrous sulfate 325 (65 FE) MG tablet Take 325 mg by mouth every other day.   glimepiride 4 MG tablet Commonly known as: AMARYL Take 4 mg by mouth daily with breakfast.   lisinopril 10 MG tablet Commonly known as: ZESTRIL Take 10 mg by mouth daily. What changed: Another medication with the same name was removed. Continue taking this medication, and follow the directions you see here.   magnesium oxide 400 (240 Mg) MG tablet Commonly known as: MAG-OX Take 400 mg by mouth daily.   multivitamin with minerals tablet Take 1 tablet by mouth daily.   oxyCODONE 5 MG immediate release tablet Commonly known as: Oxy IR/ROXICODONE Take 1 tablet (5 mg total) by mouth 3 (three) times daily with meals as needed for moderate pain, severe pain or breakthrough pain.   Vitamin D (Ergocalciferol) 1.25 MG (50000 UNIT) Caps capsule Commonly known as: DRISDOL Take 50,000 Units by mouth every 7 (seven) days. thursday               Discharge Care Instructions  (From admission, onward)           Start     Ordered   08/21/22 0000  Discharge wound care:       Comments: RN to change dressing   08/21/22 1146            Follow-up Information     Gracelyn Nurse, MD Follow up in 1 week(s).   Specialty: Internal Medicine Contact information: [address] [place] [address] [phone]         Annice Needy, MD Follow up in 1 month(s).   Specialties: Vascular Surgery, Radiology, Interventional Cardiology Contact information: [address] [place] [address] [phone]         Gwyneth Revels, DPM Follow up in 2 week(s).   Specialty:  Podiatry Contact information: [address] [place] [address] [phone]                Discharge Exam: Filed Weights   08/07/22 1603  Weight: 68 kg   General exam: Appears calm and comfortable  Respiratory system: Clear to auscultation. Respiratory effort normal. Cardiovascular system: S1 & S2 heard, RRR. No JVD, murmurs, rubs, gallops or clicks. No pedal edema. Gastrointestinal system: Abdomen is nondistended, soft and nontender. No organomegaly or masses felt. Normal bowel sounds heard. Central nervous system: Alert and oriented x2.  Left hemiparesis Extremities: Left leg weakness. Skin: No rashes, lesions or ulcers Psychiatry: Judgement and insight appear normal. Mood & affect appropriate.    Condition at discharge: fair  The results of significant diagnostics from this hospitalization (including imaging, microbiology, ancillary and laboratory) are listed below for reference.   Imaging Studies: PERIPHERAL VASCULAR CATHETERIZATION  Result Date: 08/11/2022  See surgical note for result.  CT FOOT LEFT W CONTRAST  Result Date: 08/07/2022 CLINICAL DATA:  Osteomyelitis, rule out abscess. EXAM: CT OF THE LOWER LEFT EXTREMITY WITH CONTRAST TECHNIQUE: Multidetector CT imaging of the lower left extremity was performed according to the standard protocol following intravenous contrast administration. RADIATION DOSE REDUCTION: This exam was performed according to the departmental dose-optimization program which includes automated exposure control, adjustment of the mA and/or kV according to patient size and/or use of iterative reconstruction technique. CONTRAST:  OMNIPAQUE IOHEXOL 300 MG/ML  SOLN COMPARISON:  None Available. FINDINGS: Bones/Joint/Cartilage Sclerosis of the base of the fifth metatarsal and proximal shaft, adjacent to the deep skin wound concerning for osteomyelitis. There is severe osteopenia.  No acute fracture or dislocation. Ligaments Suboptimally  assessed by CT. Muscles and Tendons Flexor and extensor tendons appear intact. Achilles tendon appear intact. Soft tissues Marked skin thickening and subcutaneous soft tissue edema about the ankle and foot. Deep skin wound about the lateral aspect of the base of the fifth metatarsal. No fluid collection or abscess identified. IMPRESSION: 1. Deep skin wound about the lateral aspect of the base of the fifth metatarsal. Sclerosis of the base of the fifth metatarsal and proximal shaft, adjacent to the deep skin wound, concerning for osteomyelitis. 2. Marked skin thickening and subcutaneous soft tissue edema about the ankle and foot, consistent with cellulitis. No fluid collection or abscess identified. 3. Severe osteopenia. Electronically Signed   By: Larose Hires D.O.   On: 08/07/2022 20:58   DG Chest Port 1 View  Result Date: 08/07/2022 CLINICAL DATA:  Preoperative study. EXAM: PORTABLE CHEST 1 VIEW COMPARISON:  Chest x-ray dated January 07, 2022. FINDINGS: The heart size and mediastinal contours are within normal limits. Normal pulmonary vascularity. No focal consolidation, pleural effusion, or pneumothorax. No acute osseous abnormality. IMPRESSION: No active disease. Electronically Signed   By: Obie Dredge M.D.   On: 08/07/2022 20:30   DG Foot 2 Views Left  Result Date: 08/07/2022 CLINICAL DATA:  Open wound in skin, diabetes EXAM: LEFT FOOT - 2 VIEW COMPARISON:  01/07/2022 FINDINGS: No displaced fracture or dislocation is seen. Osteopenia is seen in bony structures. Possible erosion is seen in the lateral margin of base of fifth metatarsal. There is lucency overlying the base of fifth metatarsal, possibly suggesting open wound in the skin. There is flattening of plantar arch. Plantar spur is seen in calcaneus. There is soft tissue swelling over the dorsum. Arterial calcifications are seen in the soft tissues. IMPRESSION: No recent fracture or dislocation is seen. There is erosive change in the lateral  aspect of base of left fifth metatarsal. Possibility of osteomyelitis is not excluded. If clinically warranted, follow-up MRI may be considered. Electronically Signed   By: Ernie Avena M.D.   On: 08/07/2022 17:45    Microbiology: Results for orders placed or performed during the hospital encounter of 08/07/22  Culture, blood (single)     Status: None   Collection Time: 08/07/22  6:06 PM   Specimen: BLOOD  Result Value Ref Range Status   Specimen Description BLOOD BLOOD LEFT FOREARM  Final   Special Requests   Final    BOTTLES DRAWN AEROBIC AND ANAEROBIC Blood Culture adequate volume   Culture   Final    NO GROWTH 5 DAYS Performed at [facility], [address] [place], [place] [address]    Report Status 08/12/2022 FINAL  Final  MRSA Next Gen by PCR, Nasal  Status: None   Collection Time: 08/07/22 11:50 PM   Specimen: Nasal Mucosa; Nasal Swab  Result Value Ref Range Status   MRSA by PCR Next Gen NOT DETECTED NOT DETECTED Final    Comment: (NOTE) The GeneXpert MRSA Assay (FDA approved for NASAL specimens only), is one component of a comprehensive MRSA colonization surveillance program. It is not intended to diagnose MRSA infection nor to guide or monitor treatment for MRSA infections. Test performance is not FDA approved in patients less than 29 years old. Performed at [facility], [address] [place], [place] [address]   Aerobic/Anaerobic Culture w Gram Stain (surgical/deep wound)     Status: None   Collection Time: 08/13/22  3:39 PM   Specimen: Path fluid; Body Fluid  Result Value Ref Range Status   Specimen Description   Final    WOUND Performed at [facility], [address] [place], [place] [address]    Special Requests   Final    FLDP Performed at [facility], [address] [place], [place] [address]    Gram Stain   Final    RARE WBC PRESENT, PREDOMINANTLY PMN NO ORGANISMS SEEN    Culture   Final    RARE  CORYNEBACTERIUM STRIATUM Standardized susceptibility testing for this organism is not available. NO ANAEROBES ISOLATED Performed at [facility], [address] [place], [place] [address]    Report Status 08/18/2022 FINAL  Final    Labs: CBC: Recent Labs  Lab 08/16/22 0635 08/17/22 0729 08/18/22 0557 08/20/22 0514 08/21/22 0320  WBC 9.6 8.5 7.7 7.4 8.7  HGB 6.6* 7.8* 7.9* 8.0* 8.5*  HCT 21.2* 24.2* 24.6* 24.8* 26.6*  MCV 97.2 91.7 94.6 92.9 94.0  PLT 260 264 248 293 316   Basic Metabolic Panel: Recent Labs  Lab 08/16/22 0635 08/17/22 0729 08/18/22 0557 08/20/22 0514 08/21/22 0320  NA 140 139 139 142 144  K 3.9 3.9 3.7 3.2* 3.7  CL 109 110 109 110 111  CO2 21* 21* 21* 23 25  GLUCOSE 143* 126* 78 120* 94  BUN 27* 18 18 17 17   CREATININE 1.41* 1.19* 1.31* 0.92 0.98  CALCIUM 8.2* 8.1* 8.0* 8.1* 8.2*  MG  --   --   --   --  1.7   Liver Function Tests: No results for input(s): "AST", "ALT", "ALKPHOS", "BILITOT", "PROT", "ALBUMIN" in the last 168 hours. CBG: Recent Labs  Lab 08/20/22 1125 08/20/22 1705 08/20/22 2137 08/21/22 0729 08/21/22 1132  GLUCAP 180* 127* 124* 91 148*    Discharge time spent: greater than 30 minutes.  Signed: Marrion Coy, MD [facility] 08/21/2022

## 2022-08-21 NOTE — TOC Transition Note (Signed)
Transition of Care [facility]) - CM/SW Discharge Note   Patient Details  Name: [name] MRN: [id number] Date of Birth: 1933-08-30  [facility] of [facility]) CM/SW Contact:  Garret Reddish, RN Phone Number: 08/21/2022, 12:45 PM   Clinical Narrative:   Received notification from [name] with [facility] that DSS has resolved payment issue and patient will be able to discharge to the facility today.  I have sent [name] patients's Discharge Summary, Discharge orders, and SNF transfer Report.  [name] reports that patient can discharge to room 110A and call report to [phone].  I have made [name] aware that patient will be discharging to [facility] today.    I have arranged EMS with [facility] for transport to the facility today.    I have informed staff nurse of the above information.     Final next level of care: Skilled Nursing Facility Barriers to Discharge: Barriers Resolved (DSS has resovled Barriers with [facility])   Patient Goals and CMS Choice      Discharge Placement                Patient chooses bed at: [facility] [place] Patient to be transferred to facility by: [facility] Name of family member notified: DSS representative  [name] Patient and family notified of of transfer: 08/21/22  Discharge Plan and Services Additional resources added to the After Visit Summary for                                       Social Determinants of Health (SDOH) Interventions SDOH Screenings   Food Insecurity: No Food Insecurity (08/19/2022)  Housing: Patient Declined (08/19/2022)  Transportation Needs: No Transportation Needs (08/19/2022)  Tobacco Use: Low Risk  (08/14/2022)     Readmission Risk Interventions     No data to display

## 2022-08-21 NOTE — TOC Progression Note (Signed)
Transition of Care [facility]) - Progression Note    Patient Details  Name: [name] MRN: [id number] Date of Birth: [birth year]  Transition of Care [facility]) CM/SW Contact  Garret Reddish, RN Phone Number: 08/21/2022, 2:33 PM  Clinical Narrative:   Received chat from unit staff nurse.  She has inquired if patient will need wound vac at the facility.  Noted that wound vac has been placed back on patient on 08-17-2022. WOC nurse consulted to on patient today.    I have spoken with [name] at [facility] and she reports that she will need wound measurements and orders for wound vac at the facility.  [name] informs me that she will be able to obtain wound vac by tomorrow.  I have reached out to Dr. Ether Griffins to see if patient can be transferred to facility with a dressing until wound vac can be obtained by the facility.  I have also asked Dr. Ether Griffins to provide wound vac orders.  Will await instructions on wound vac to discharging patient.    I have cancelled EMS at this time.   I have sent [name] with [facility] WOC notes.   I have made provider, and staff nurse aware.        Expected Discharge Plan: Skilled Nursing Facility Barriers to Discharge: Barriers Resolved (DSS has resovled Barriers with [facility])  Expected Discharge Plan and Services       Living arrangements for the past 2 months: Skilled Nursing Facility Expected Discharge Date: 08/21/22                                     Social Determinants of Health (SDOH) Interventions SDOH Screenings   Food Insecurity: No Food Insecurity (08/19/2022)  Housing: Patient Declined (08/19/2022)  Transportation Needs: No Transportation Needs (08/19/2022)  Tobacco Use: Low Risk  (08/14/2022)    Readmission Risk Interventions     No data to display

## 2022-08-21 NOTE — Plan of Care (Signed)
  Problem: Metabolic: Goal: Ability to maintain appropriate glucose levels will improve Outcome: Progressing   Problem: Nutritional: Goal: Maintenance of adequate nutrition will improve Outcome: Progressing Goal: Progress toward achieving an optimal weight will improve Outcome: Progressing   Problem: Skin Integrity: Goal: Risk for impaired skin integrity will decrease Outcome: Progressing   Problem: Tissue Perfusion: Goal: Adequacy of tissue perfusion will improve Outcome: Progressing   Problem: Clinical Measurements: Goal: Ability to maintain clinical measurements within normal limits will improve Outcome: Progressing Goal: Diagnostic test results will improve Outcome: Progressing Goal: Cardiovascular complication will be avoided Outcome: Progressing

## 2022-08-22 DIAGNOSIS — M24542 Contracture, left hand: Secondary | ICD-10-CM | POA: Diagnosis not present

## 2022-08-22 DIAGNOSIS — R293 Abnormal posture: Secondary | ICD-10-CM | POA: Diagnosis not present

## 2022-08-22 DIAGNOSIS — I69952 Hemiplegia and hemiparesis following unspecified cerebrovascular disease affecting left dominant side: Secondary | ICD-10-CM | POA: Diagnosis not present

## 2022-08-22 DIAGNOSIS — R278 Other lack of coordination: Secondary | ICD-10-CM | POA: Diagnosis not present

## 2022-08-22 DIAGNOSIS — M86172 Other acute osteomyelitis, left ankle and foot: Secondary | ICD-10-CM | POA: Diagnosis not present

## 2022-08-22 DIAGNOSIS — M6259 Muscle wasting and atrophy, not elsewhere classified, multiple sites: Secondary | ICD-10-CM | POA: Diagnosis not present

## 2022-08-22 DIAGNOSIS — R2681 Unsteadiness on feet: Secondary | ICD-10-CM | POA: Diagnosis not present

## 2022-08-22 DIAGNOSIS — E11628 Type 2 diabetes mellitus with other skin complications: Secondary | ICD-10-CM | POA: Diagnosis not present

## 2022-08-22 DIAGNOSIS — L089 Local infection of the skin and subcutaneous tissue, unspecified: Secondary | ICD-10-CM | POA: Diagnosis not present

## 2022-08-22 DIAGNOSIS — I69354 Hemiplegia and hemiparesis following cerebral infarction affecting left non-dominant side: Secondary | ICD-10-CM | POA: Diagnosis not present

## 2022-08-22 DIAGNOSIS — M24532 Contracture, left wrist: Secondary | ICD-10-CM | POA: Diagnosis not present

## 2022-08-22 DIAGNOSIS — M24522 Contracture, left elbow: Secondary | ICD-10-CM | POA: Diagnosis not present

## 2022-08-22 DIAGNOSIS — Z741 Need for assistance with personal care: Secondary | ICD-10-CM | POA: Diagnosis not present

## 2022-08-22 LAB — GLUCOSE, CAPILLARY: Glucose-Capillary: 97 mg/dL (ref 70–99)

## 2022-08-22 NOTE — TOC Transition Note (Signed)
Transition of Care [facility]) - CM/SW Discharge Note   Patient Details  Name: [name] MRN: [id number] Date of Birth: 1933-11-23  Transition of Care [facility]) CM/SW Contact:  Garret Reddish, RN Phone Number: 08/22/2022, 10:08 AM   Clinical Narrative:   Chart reviewed.  Patient has orders for discharge today.  I have spoken with [facility] and hey have received orders for wound vac and has ordered wound vac.  Facility coordinator [name] reports that wound vac should be a facility today.  [name] reports that patient can come to the facility today.  [name] reports that room number will be 110 A and number to call report is [phone]. I have faxed [name] patient SNF Transfer Report, Discharge Summary.    I have informed DSS Guardian [name] that patient will be a discharge for today.    I will arrange EMS transport via [facility].    I have informed staff nurse.      Final next level of care: Skilled Nursing Facility Barriers to Discharge: Barriers Resolved (Wound Vac has been ordered by facility)   Patient Goals and CMS Choice CMS Medicare.gov Compare Post Acute Care list provided to:: Patient Represenative (must comment) (DSS is patient's guardian)    Discharge Placement                Patient chooses bed at: [facility] [place] Patient to be transferred to facility by: [facility] Name of family member notified: DSS Milinda Pointer Marrow notified Patient and family notified of of transfer: 08/22/22  Discharge Plan and Services Additional resources added to the After Visit Summary for                                       Social Determinants of Health (SDOH) Interventions SDOH Screenings   Food Insecurity: No Food Insecurity (08/19/2022)  Housing: Patient Declined (08/19/2022)  Transportation Needs: No Transportation Needs (08/19/2022)  Tobacco Use: Low Risk  (08/14/2022)     Readmission Risk Interventions     No data to display

## 2022-08-22 NOTE — Plan of Care (Signed)
  Problem: Coping: Goal: Ability to adjust to condition or change in health will improve Outcome: Progressing   Problem: Fluid Volume: Goal: Ability to maintain a balanced intake and output will improve Outcome: Progressing   Problem: Health Behavior/Discharge Planning: Goal: Ability to identify and utilize available resources and services will improve Outcome: Progressing   Problem: Skin Integrity: Goal: Risk for impaired skin integrity will decrease Outcome: Progressing   Problem: Clinical Measurements: Goal: Will remain free from infection Outcome: Progressing   Problem: Pain Managment: Goal: General experience of comfort will improve Outcome: Progressing   Problem: Activity: Goal: Ability to return to baseline activity level will improve Outcome: Progressing   Problem: Cardiovascular: Goal: Ability to achieve and maintain adequate cardiovascular perfusion will improve Outcome: Progressing   Problem: Health Behavior/Discharge Planning: Goal: Ability to safely manage health-related needs after discharge will improve Outcome: Progressing   Problem: Nutritional: Goal: Maintenance of adequate nutrition will improve Outcome: Not Progressing

## 2022-08-22 NOTE — Progress Notes (Signed)
Report and discharge instructions called to [facility], Med Aide at [facility].

## 2022-08-22 NOTE — Care Management Important Message (Signed)
Important Message  Patient Details  Name: [name] MRN: [id number] Date of Birth: 18-Jun-1933   Medicare Important Message Given:  Yes     [name] A Nadav Swindell 08/22/2022, 10:30 AM

## 2022-08-22 NOTE — Plan of Care (Signed)
  Problem: Coping: Goal: Ability to adjust to condition or change in health will improve Outcome: Progressing   Problem: Fluid Volume: Goal: Ability to maintain a balanced intake and output will improve Outcome: Progressing   Problem: Health Behavior/Discharge Planning: Goal: Ability to identify and utilize available resources and services will improve Outcome: Progressing   Problem: Skin Integrity: Goal: Risk for impaired skin integrity will decrease Outcome: Progressing   Problem: Clinical Measurements: Goal: Will remain free from infection Outcome: Progressing   Problem: Pain Managment: Goal: General experience of comfort will improve Outcome: Progressing   Problem: Nutritional: Goal: Maintenance of adequate nutrition will improve Outcome: Not Progressing

## 2022-08-22 NOTE — Discharge Summary (Addendum)
Physician Discharge Summary   Patient: [name] MRN: [id number] DOB: 11-Dec-1933  Admit date:     08/07/2022  Discharge date: 08/22/22  Discharge Physician: Marrion Coy   PCP: Gracelyn Nurse, MD   Recommendations at discharge:    Follow-up with PCP in 1 week. Follow-up with podiatry in 2 weeks. Follow-up with vascular surgery in 2 months.   Discharge Diagnoses: Principal Problem:   Diabetic foot infection (HCC) Active Problems:   Type 2 diabetes mellitus with polyneuropathy (HCC)   Mixed hyperlipidemia   Essential hypertension   Malnutrition of moderate degree   Left hemiparesis (HCC)   Cellulitis of left foot   Hypokalemia   Right hemiparesis (HCC)   PAD (peripheral artery disease) (HCC)   Hemiplegia affecting left side in left-dominant patient as late effect of cerebrovascular disease (HCC)   Acute osteomyelitis of left foot (HCC)  Resolved Problems:   * No resolved hospital problems. *  Hospital Course: [name] is a 89 y.o. female with medical history significant of unspecified dementia, type 2 diabetes, diabetic polyneuropathy, essential hypertension and dyslipidemia who was sent over by wound care for worsening left foot ulcer with infection. CT scan showed significant cellulitis and concern for fifth metatarsal osteomyelitis.  Patient was started on Zosyn and vancomycin. 7/1: S/p LLE angio and successful recanalization of the left lower extremity for limb salvage 7/3: Fifth ray amputation left foot & Wound VAC placement left lateral foot  Patient currently pending nursing home placement.   Assessment and Plan: Diabetic foot infection. Left foot cellulitis with osteomyelitis. Peripheral arterial disease. CT scan with contrast showed evidence of osteomyelitis, could not get MRI due to metal object in the eye. Antibiotics transitioned from IV vancomycin and Zosyn. Status post left lower extremity angiogram and successful recanalization of the left  lower extremity for limb salvage S/p Fifth ray amputation left foot & Wound VAC placement left lateral foot on 7/3 Condition improving, antibiotic switched to amoxicillin for additional 2 weeks.     History of stroke with left hemiparesis. Dementia without behavioral disturbance. Continue home medicines.  Requires Hoyer lift for transfer   AKI Hypokalemia. Condition improved.   Essential hypertension, Dyslipidemia. Resume lisinopril and statin.   Type 2 diabetes with polyneuropathy. Resume home medicine.   Acute on chronic blood loss anemia. Patient was chronically on iron and B12,  will continue.   Patient with a plan to be discharged yesterday, not able to go as wound VAC was not set up.  No change in patient condition, still medically stable for discharge.       Consultants: Vascular surgery, podiatry. Procedures performed: Leg angioplasty, fifth toe ray amputation. Disposition: Skilled nursing facility Diet recommendation:  Discharge Diet Orders (From admission, onward)     Start     Ordered   08/21/22 0000  Diet - low sodium heart healthy        08/21/22 1146           Cardiac diet DISCHARGE MEDICATION: Allergies as of 08/22/2022       Reactions   Metformin And Related Nausea And Vomiting        Medication List     STOP taking these medications    hydrochlorothiazide 12.5 MG tablet Commonly known as: HYDRODIURIL       TAKE these medications    acetaminophen 325 MG tablet Commonly known as: TYLENOL Take 2 tablets (650 mg total) by mouth every 6 (six) hours as needed for mild pain, moderate  pain, fever or headache.   amoxicillin 500 MG capsule Commonly known as: AMOXIL Take 1 capsule (500 mg total) by mouth every 8 (eight) hours for 13 days.   ascorbic acid 500 MG tablet Commonly known as: VITAMIN C Take 500 mg by mouth 2 (two) times daily.   aspirin EC 81 MG tablet Take 81 mg by mouth daily. Swallow whole.   atorvastatin 80 MG  tablet Commonly known as: LIPITOR Take 1 tablet (80 mg total) by mouth daily. What changed: when to take this   clopidogrel 75 MG tablet Commonly known as: PLAVIX Take 1 tablet (75 mg total) by mouth daily.   cyanocobalamin 1000 MCG tablet Commonly known as: VITAMIN B12 Take 1,000 mcg by mouth daily.   feeding supplement (PRO-STAT SUGAR FREE 64) Liqd Take 30 mLs by mouth in the morning and at bedtime.   ferrous sulfate 325 (65 FE) MG tablet Take 325 mg by mouth every other day.   glimepiride 4 MG tablet Commonly known as: AMARYL Take 4 mg by mouth daily with breakfast.   lisinopril 10 MG tablet Commonly known as: ZESTRIL Take 10 mg by mouth daily. What changed: Another medication with the same name was removed. Continue taking this medication, and follow the directions you see here.   magnesium oxide 400 (240 Mg) MG tablet Commonly known as: MAG-OX Take 400 mg by mouth daily.   multivitamin with minerals tablet Take 1 tablet by mouth daily.   oxyCODONE 5 MG immediate release tablet Commonly known as: Oxy IR/ROXICODONE Take 1 tablet (5 mg total) by mouth 3 (three) times daily with meals as needed for moderate pain, severe pain or breakthrough pain.   Vitamin D (Ergocalciferol) 1.25 MG (50000 UNIT) Caps capsule Commonly known as: DRISDOL Take 50,000 Units by mouth every 7 (seven) days. thursday               Discharge Care Instructions  (From admission, onward)           Start     Ordered   08/21/22 0000  Discharge wound care:       Comments: RN to change dressing   08/21/22 1146            Contact information for follow-up providers     Gracelyn Nurse, MD Follow up in 1 week(s).   Specialty: Internal Medicine Contact information: [address] [place] [address] [phone]         Annice Needy, MD Follow up in 1 month(s).   Specialties: Vascular Surgery, Radiology, Interventional Cardiology Contact information: [address] [place] [address] [phone]         Gwyneth Revels, DPM Follow up in 2 week(s).   Specialty: Podiatry Contact information: [address] [place] [address] [phone]              Contact information for after-discharge care     Destination     [facility] Preferred SNF .   Service: Skilled Nursing Contact information: [address] [place] [address] [phone]                    Discharge Exam: Ceasar Mons Weights   08/07/22 1603  Weight: 68 kg   General exam: Appears calm and comfortable  Respiratory system: Clear to auscultation. Respiratory effort normal. Cardiovascular system: S1 & S2 heard, RRR. No JVD, murmurs, rubs, gallops or clicks. No pedal edema. Gastrointestinal  system: Abdomen is nondistended, soft and nontender. No organomegaly or masses felt. Normal bowel sounds heard. Central nervous system: Alert and oriented x2.  Left hemiplegia. Extremities: Symmetric 5 x 5 power. Skin: No rashes, lesions or ulcers Psychiatry: Judgement and insight appear normal. Mood & affect appropriate.    Condition at discharge: good  The results of significant diagnostics from this hospitalization (including imaging, microbiology, ancillary and laboratory) are listed below for reference.   Imaging Studies: PERIPHERAL VASCULAR CATHETERIZATION  Result Date: 08/11/2022 See surgical note for result.  CT FOOT LEFT W CONTRAST  Result Date: 08/07/2022 CLINICAL DATA:  Osteomyelitis, rule out abscess. EXAM: CT OF THE LOWER LEFT EXTREMITY WITH CONTRAST TECHNIQUE: Multidetector CT imaging of the lower left extremity was performed according to the standard protocol following intravenous contrast administration. RADIATION DOSE REDUCTION: This exam was performed according to the departmental dose-optimization program which includes automated exposure control, adjustment of the mA and/or kV  according to patient size and/or use of iterative reconstruction technique. CONTRAST:  OMNIPAQUE IOHEXOL 300 MG/ML  SOLN COMPARISON:  None Available. FINDINGS: Bones/Joint/Cartilage Sclerosis of the base of the fifth metatarsal and proximal shaft, adjacent to the deep skin wound concerning for osteomyelitis. There is severe osteopenia.  No acute fracture or dislocation. Ligaments Suboptimally assessed by CT. Muscles and Tendons Flexor and extensor tendons appear intact. Achilles tendon appear intact. Soft tissues Marked skin thickening and subcutaneous soft tissue edema about the ankle and foot. Deep skin wound about the lateral aspect of the base of the fifth metatarsal. No fluid collection or abscess identified. IMPRESSION: 1. Deep skin wound about the lateral aspect of the base of the fifth metatarsal. Sclerosis of the base of the fifth metatarsal and proximal shaft, adjacent to the deep skin wound, concerning for osteomyelitis. 2. Marked skin thickening and subcutaneous soft tissue edema about the ankle and foot, consistent with cellulitis. No fluid collection or abscess identified. 3. Severe osteopenia. Electronically Signed   By: Larose Hires D.O.   On: 08/07/2022 20:58   DG Chest Port 1 View  Result Date: 08/07/2022 CLINICAL DATA:  Preoperative study. EXAM: PORTABLE CHEST 1 VIEW COMPARISON:  Chest x-ray dated January 07, 2022. FINDINGS: The heart size and mediastinal contours are within normal limits. Normal pulmonary vascularity. No focal consolidation, pleural effusion, or pneumothorax. No acute osseous abnormality. IMPRESSION: No active disease. Electronically Signed   By: Obie Dredge M.D.   On: 08/07/2022 20:30   DG Foot 2 Views Left  Result Date: 08/07/2022 CLINICAL DATA:  Open wound in skin, diabetes EXAM: LEFT FOOT - 2 VIEW COMPARISON:  01/07/2022 FINDINGS: No displaced fracture or dislocation is seen. Osteopenia is seen in bony structures. Possible erosion is seen in the lateral  margin of base of fifth metatarsal. There is lucency overlying the base of fifth metatarsal, possibly suggesting open wound in the skin. There is flattening of plantar arch. Plantar spur is seen in calcaneus. There is soft tissue swelling over the dorsum. Arterial calcifications are seen in the soft tissues. IMPRESSION: No recent fracture or dislocation is seen. There is erosive change in the lateral aspect of base of left fifth metatarsal. Possibility of osteomyelitis is not excluded. If clinically warranted, follow-up MRI may be considered. Electronically Signed   By: Ernie Avena M.D.   On: 08/07/2022 17:45    Microbiology: Results for orders placed or performed during the hospital encounter of 08/07/22  Culture, blood (single)     Status: None   Collection Time: 08/07/22  6:06 PM   Specimen: BLOOD  Result Value Ref Range Status   Specimen Description BLOOD BLOOD LEFT FOREARM  Final   Special Requests   Final    BOTTLES DRAWN AEROBIC AND ANAEROBIC Blood Culture adequate volume   Culture   Final    NO GROWTH 5 DAYS Performed at [facility], [address] [place], [place] [address]    Report Status 08/12/2022 FINAL  Final  MRSA Next Gen by PCR, Nasal     Status: None   Collection Time: 08/07/22 11:50 PM   Specimen: Nasal Mucosa; Nasal Swab  Result Value Ref Range Status   MRSA by PCR Next Gen NOT DETECTED NOT DETECTED Final    Comment: (NOTE) The GeneXpert MRSA Assay (FDA approved for NASAL specimens only), is one component of a comprehensive MRSA colonization surveillance program. It is not intended to diagnose MRSA infection nor to guide or monitor treatment for MRSA infections. Test performance is not FDA approved in patients less than 64 years old. Performed at [facility], [address] [place], [place] [address]   Aerobic/Anaerobic Culture w Gram Stain (surgical/deep wound)     Status: None   Collection Time: 08/13/22  3:39 PM   Specimen: Path  fluid; Body Fluid  Result Value Ref Range Status   Specimen Description   Final    WOUND Performed at [facility], [address], [place], [place] [address]    Special Requests   Final    FLDP Performed at [facility], [address] [place], [place] [address]    Gram Stain   Final    RARE WBC PRESENT, PREDOMINANTLY PMN NO ORGANISMS SEEN    Culture   Final    RARE CORYNEBACTERIUM STRIATUM Standardized susceptibility testing for this organism is not available. NO ANAEROBES ISOLATED Performed at [facility], [address] [place], [place] [address]    Report Status 08/18/2022 FINAL  Final    Labs: CBC: Recent Labs  Lab 08/16/22 0635 08/17/22 0729 08/18/22 0557 08/20/22 0514 08/21/22 0320  WBC 9.6 8.5 7.7 7.4 8.7  HGB 6.6* 7.8* 7.9* 8.0* 8.5*  HCT 21.2* 24.2* 24.6* 24.8* 26.6*  MCV 97.2 91.7 94.6 92.9 94.0  PLT 260 264 248 293 316   Basic Metabolic Panel: Recent Labs  Lab 08/16/22 0635 08/17/22 0729 08/18/22 0557 08/20/22 0514 08/21/22 0320  NA 140 139 139 142 144  K 3.9 3.9 3.7 3.2* 3.7  CL 109 110 109 110 111  CO2 21* 21* 21* 23 25  GLUCOSE 143* 126* 78 120* 94  BUN 27* 18 18 17 17   CREATININE 1.41* 1.19* 1.31* 0.92 0.98  CALCIUM 8.2* 8.1* 8.0* 8.1* 8.2*  MG  --   --   --   --  1.7   Liver Function Tests: No results for input(s): "AST", "ALT", "ALKPHOS", "BILITOT", "PROT", "ALBUMIN" in the last 168 hours. CBG: Recent Labs  Lab 08/21/22 0729 08/21/22 1132 08/21/22 1700 08/21/22 2116 08/22/22 0723  GLUCAP 91 148* 180* 76 97    Discharge time spent: less than 30 minutes.  Signed: Marrion Coy, MD [facility] 08/22/2022

## 2022-08-23 DIAGNOSIS — M24522 Contracture, left elbow: Secondary | ICD-10-CM | POA: Diagnosis not present

## 2022-08-23 DIAGNOSIS — M24532 Contracture, left wrist: Secondary | ICD-10-CM | POA: Diagnosis not present

## 2022-08-23 DIAGNOSIS — I69354 Hemiplegia and hemiparesis following cerebral infarction affecting left non-dominant side: Secondary | ICD-10-CM | POA: Diagnosis not present

## 2022-08-23 DIAGNOSIS — M24542 Contracture, left hand: Secondary | ICD-10-CM | POA: Diagnosis not present

## 2022-08-23 DIAGNOSIS — R2681 Unsteadiness on feet: Secondary | ICD-10-CM | POA: Diagnosis not present

## 2022-08-23 DIAGNOSIS — R278 Other lack of coordination: Secondary | ICD-10-CM | POA: Diagnosis not present

## 2022-08-23 DIAGNOSIS — M6259 Muscle wasting and atrophy, not elsewhere classified, multiple sites: Secondary | ICD-10-CM | POA: Diagnosis not present

## 2022-08-23 DIAGNOSIS — Z741 Need for assistance with personal care: Secondary | ICD-10-CM | POA: Diagnosis not present

## 2022-08-23 DIAGNOSIS — R293 Abnormal posture: Secondary | ICD-10-CM | POA: Diagnosis not present

## 2022-08-25 DIAGNOSIS — I739 Peripheral vascular disease, unspecified: Secondary | ICD-10-CM | POA: Diagnosis not present

## 2022-08-25 DIAGNOSIS — M86272 Subacute osteomyelitis, left ankle and foot: Secondary | ICD-10-CM | POA: Diagnosis not present

## 2022-08-25 DIAGNOSIS — M6259 Muscle wasting and atrophy, not elsewhere classified, multiple sites: Secondary | ICD-10-CM | POA: Diagnosis not present

## 2022-08-25 DIAGNOSIS — Z741 Need for assistance with personal care: Secondary | ICD-10-CM | POA: Diagnosis not present

## 2022-08-25 DIAGNOSIS — R2681 Unsteadiness on feet: Secondary | ICD-10-CM | POA: Diagnosis not present

## 2022-08-25 DIAGNOSIS — M24542 Contracture, left hand: Secondary | ICD-10-CM | POA: Diagnosis not present

## 2022-08-25 DIAGNOSIS — M24522 Contracture, left elbow: Secondary | ICD-10-CM | POA: Diagnosis not present

## 2022-08-25 DIAGNOSIS — R278 Other lack of coordination: Secondary | ICD-10-CM | POA: Diagnosis not present

## 2022-08-25 DIAGNOSIS — M24532 Contracture, left wrist: Secondary | ICD-10-CM | POA: Diagnosis not present

## 2022-08-25 DIAGNOSIS — I69354 Hemiplegia and hemiparesis following cerebral infarction affecting left non-dominant side: Secondary | ICD-10-CM | POA: Diagnosis not present

## 2022-08-25 DIAGNOSIS — R293 Abnormal posture: Secondary | ICD-10-CM | POA: Diagnosis not present

## 2022-08-26 ENCOUNTER — Emergency Department: Payer: Medicare HMO

## 2022-08-26 ENCOUNTER — Other Ambulatory Visit: Payer: Self-pay

## 2022-08-26 ENCOUNTER — Observation Stay
Admission: EM | Admit: 2022-08-26 | Discharge: 2022-08-27 | Disposition: A | Discharge status: Skilled Nursing Facility | Payer: Medicare HMO | Attending: Student in an Organized Health Care Education/Training Program | Admitting: Student in an Organized Health Care Education/Training Program

## 2022-08-26 DIAGNOSIS — M62272 Nontraumatic ischemic infarction of muscle, left ankle and foot: Secondary | ICD-10-CM | POA: Insufficient documentation

## 2022-08-26 DIAGNOSIS — Z7902 Long term (current) use of antithrombotics/antiplatelets: Secondary | ICD-10-CM | POA: Diagnosis not present

## 2022-08-26 DIAGNOSIS — M86272 Subacute osteomyelitis, left ankle and foot: Secondary | ICD-10-CM | POA: Diagnosis not present

## 2022-08-26 DIAGNOSIS — F0390 Unspecified dementia without behavioral disturbance: Secondary | ICD-10-CM | POA: Insufficient documentation

## 2022-08-26 DIAGNOSIS — N390 Urinary tract infection, site not specified: Secondary | ICD-10-CM | POA: Diagnosis present

## 2022-08-26 DIAGNOSIS — M869 Osteomyelitis, unspecified: Secondary | ICD-10-CM | POA: Diagnosis not present

## 2022-08-26 DIAGNOSIS — E1151 Type 2 diabetes mellitus with diabetic peripheral angiopathy without gangrene: Secondary | ICD-10-CM

## 2022-08-26 DIAGNOSIS — M86172 Other acute osteomyelitis, left ankle and foot: Secondary | ICD-10-CM | POA: Diagnosis not present

## 2022-08-26 DIAGNOSIS — Z7982 Long term (current) use of aspirin: Secondary | ICD-10-CM | POA: Insufficient documentation

## 2022-08-26 DIAGNOSIS — E162 Hypoglycemia, unspecified: Secondary | ICD-10-CM | POA: Diagnosis present

## 2022-08-26 DIAGNOSIS — I70245 Atherosclerosis of native arteries of left leg with ulceration of other part of foot: Secondary | ICD-10-CM

## 2022-08-26 DIAGNOSIS — A419 Sepsis, unspecified organism: Secondary | ICD-10-CM | POA: Diagnosis not present

## 2022-08-26 DIAGNOSIS — I96 Gangrene, not elsewhere classified: Principal | ICD-10-CM

## 2022-08-26 DIAGNOSIS — L97529 Non-pressure chronic ulcer of other part of left foot with unspecified severity: Secondary | ICD-10-CM

## 2022-08-26 DIAGNOSIS — I10 Essential (primary) hypertension: Secondary | ICD-10-CM | POA: Diagnosis not present

## 2022-08-26 DIAGNOSIS — Z9889 Other specified postprocedural states: Secondary | ICD-10-CM

## 2022-08-26 DIAGNOSIS — I739 Peripheral vascular disease, unspecified: Secondary | ICD-10-CM | POA: Diagnosis present

## 2022-08-26 DIAGNOSIS — E1142 Type 2 diabetes mellitus with diabetic polyneuropathy: Secondary | ICD-10-CM

## 2022-08-26 DIAGNOSIS — E118 Type 2 diabetes mellitus with unspecified complications: Secondary | ICD-10-CM | POA: Diagnosis not present

## 2022-08-26 DIAGNOSIS — Z96653 Presence of artificial knee joint, bilateral: Secondary | ICD-10-CM | POA: Diagnosis not present

## 2022-08-26 DIAGNOSIS — Z89422 Acquired absence of other left toe(s): Secondary | ICD-10-CM | POA: Diagnosis not present

## 2022-08-26 DIAGNOSIS — M24562 Contracture, left knee: Secondary | ICD-10-CM | POA: Diagnosis not present

## 2022-08-26 DIAGNOSIS — E785 Hyperlipidemia, unspecified: Secondary | ICD-10-CM

## 2022-08-26 DIAGNOSIS — D72829 Elevated white blood cell count, unspecified: Secondary | ICD-10-CM | POA: Diagnosis not present

## 2022-08-26 DIAGNOSIS — E11649 Type 2 diabetes mellitus with hypoglycemia without coma: Secondary | ICD-10-CM | POA: Diagnosis not present

## 2022-08-26 DIAGNOSIS — Z96652 Presence of left artificial knee joint: Secondary | ICD-10-CM | POA: Diagnosis not present

## 2022-08-26 DIAGNOSIS — I998 Other disorder of circulatory system: Principal | ICD-10-CM

## 2022-08-26 DIAGNOSIS — M86179 Other acute osteomyelitis, unspecified ankle and foot: Secondary | ICD-10-CM | POA: Diagnosis not present

## 2022-08-26 DIAGNOSIS — Z8673 Personal history of transient ischemic attack (TIA), and cerebral infarction without residual deficits: Secondary | ICD-10-CM

## 2022-08-26 DIAGNOSIS — Z79899 Other long term (current) drug therapy: Secondary | ICD-10-CM

## 2022-08-26 DIAGNOSIS — M7732 Calcaneal spur, left foot: Secondary | ICD-10-CM | POA: Diagnosis not present

## 2022-08-26 DIAGNOSIS — G8194 Hemiplegia, unspecified affecting left nondominant side: Secondary | ICD-10-CM | POA: Diagnosis present

## 2022-08-26 LAB — COMPREHENSIVE METABOLIC PANEL
ALT: 13 U/L (ref 0–44)
AST: 26 U/L (ref 15–41)
Albumin: 3.1 g/dL — ABNORMAL LOW (ref 3.5–5.0)
Alkaline Phosphatase: 76 U/L (ref 38–126)
Anion gap: 10 (ref 5–15)
BUN: 16 mg/dL (ref 8–23)
CO2: 26 mmol/L (ref 22–32)
Calcium: 8.7 mg/dL — ABNORMAL LOW (ref 8.9–10.3)
Chloride: 103 mmol/L (ref 98–111)
Creatinine, Ser: 0.75 mg/dL (ref 0.44–1.00)
GFR, Estimated: >60 mL/min (ref >60)
Glucose, Bld: 68 mg/dL — ABNORMAL LOW (ref 70–99)
Potassium: 3.1 mmol/L — ABNORMAL LOW (ref 3.5–5.1)
Sodium: 139 mmol/L (ref 135–145)
Total Bilirubin: 0.6 mg/dL (ref 0.3–1.2)
Total Protein: 7.2 g/dL (ref 6.5–8.1)

## 2022-08-26 LAB — CBC
HCT: 34.2 % — ABNORMAL LOW (ref 36.0–46.0)
Hemoglobin: 10.7 g/dL — ABNORMAL LOW (ref 12.0–15.0)
MCH: 29.8 pg (ref 26.0–34.0)
MCHC: 31.3 g/dL (ref 30.0–36.0)
MCV: 95.3 fL (ref 80.0–100.0)
Platelets: 460 10*3/uL — ABNORMAL HIGH (ref 150–400)
RBC: 3.59 MIL/uL — ABNORMAL LOW (ref 3.87–5.11)
RDW: 15 % (ref 11.5–15.5)
WBC: 22.4 10*3/uL — ABNORMAL HIGH (ref 4.0–10.5)
nRBC: 0 % (ref 0.0–0.2)

## 2022-08-26 LAB — CBG MONITORING, ED
Glucose-Capillary: 71 mg/dL (ref 70–99)
Glucose-Capillary: 77 mg/dL (ref 70–99)

## 2022-08-26 LAB — MAGNESIUM: Magnesium: 1.8 mg/dL (ref 1.7–2.4)

## 2022-08-26 LAB — GLUCOSE, CAPILLARY
Glucose-Capillary: 170 mg/dL — ABNORMAL HIGH (ref 70–99)
Glucose-Capillary: 25 mg/dL — CL (ref 70–99)
Glucose-Capillary: 26 mg/dL — CL (ref 70–99)
Glucose-Capillary: 86 mg/dL (ref 70–99)

## 2022-08-26 LAB — LACTIC ACID, PLASMA
Lactic Acid, Venous: 1.5 mmol/L (ref 0.5–1.9)
Lactic Acid, Venous: 1.3 mmol/L (ref 0.5–1.9)

## 2022-08-26 LAB — PROTIME-INR
INR: 1.2 (ref 0.8–1.2)
Prothrombin Time: 15.1 seconds (ref 11.4–15.2)

## 2022-08-26 LAB — PROCALCITONIN: Procalcitonin: 2.96 ng/mL

## 2022-08-26 LAB — APTT: aPTT: 37 seconds — ABNORMAL HIGH (ref 24–36)

## 2022-08-26 MED ORDER — DEXTROSE 50 % IV SOLN
1.0000 | Freq: Once | INTRAVENOUS | Status: AC
  Administered 2022-08-26: 50 mL via INTRAVENOUS

## 2022-08-26 MED ORDER — POTASSIUM CHLORIDE CRYS ER 20 MEQ PO TBCR
40.0000 meq | EXTENDED_RELEASE_TABLET | Freq: Once | ORAL | Status: AC
  Administered 2022-08-26: 40 meq via ORAL
  Filled 2022-08-26: qty 2

## 2022-08-26 MED ORDER — SODIUM CHLORIDE 0.9 % IV SOLN
2.0000 g | INTRAVENOUS | Status: DC
  Administered 2022-08-26: 2 g via INTRAVENOUS
  Filled 2022-08-26 (×2): qty 20

## 2022-08-26 MED ORDER — INSULIN ASPART 100 UNIT/ML IJ SOLN: 0.0000 [IU] | Freq: Three times a day (TID) | INTRAMUSCULAR | Status: DC

## 2022-08-26 MED ORDER — SODIUM CHLORIDE 0.9 % IV BOLUS
500.0000 mL | Freq: Once | INTRAVENOUS | Status: AC
  Administered 2022-08-26: 500 mL via INTRAVENOUS

## 2022-08-26 MED ORDER — HEPARIN (PORCINE) 25000 UT/250ML-% IV SOLN
1000.0000 [IU]/h | INTRAVENOUS | Status: DC
  Administered 2022-08-26 – 2022-08-27 (×3): 1000 [IU]/h via INTRAVENOUS
  Filled 2022-08-26 (×2): qty 250

## 2022-08-26 MED ORDER — ONDANSETRON HCL 4 MG/2ML IJ SOLN: 4.0000 mg | Freq: Four times a day (QID) | INTRAMUSCULAR | Status: DC | PRN

## 2022-08-26 MED ORDER — POLYETHYLENE GLYCOL 3350 17 G PO PACK: 17.0000 g | PACK | Freq: Every day | ORAL | Status: DC | PRN

## 2022-08-26 MED ORDER — ACETAMINOPHEN 650 MG RE SUPP: 650.0000 mg | Freq: Four times a day (QID) | RECTAL | Status: DC | PRN

## 2022-08-26 MED ORDER — ENOXAPARIN SODIUM 40 MG/0.4ML IJ SOSY: 40.0000 mg | PREFILLED_SYRINGE | INTRAMUSCULAR | Status: DC

## 2022-08-26 MED ORDER — VANCOMYCIN HCL IN DEXTROSE 1-5 GM/200ML-% IV SOLN
1000.0000 mg | Freq: Once | INTRAVENOUS | Status: AC
  Administered 2022-08-26: 1000 mg via INTRAVENOUS
  Filled 2022-08-26: qty 200

## 2022-08-26 MED ORDER — ASPIRIN 81 MG PO TBEC
81.0000 mg | DELAYED_RELEASE_TABLET | Freq: Every day | ORAL | Status: DC
  Administered 2022-08-27: 81 mg via ORAL
  Filled 2022-08-26: qty 1

## 2022-08-26 MED ORDER — ADULT MULTIVITAMIN W/MINERALS CH
1.0000 | ORAL_TABLET | Freq: Every day | ORAL | Status: DC
  Administered 2022-08-27: 1 via ORAL
  Filled 2022-08-26: qty 1

## 2022-08-26 MED ORDER — OXYCODONE HCL 5 MG PO TABS: 5.0000 mg | ORAL_TABLET | Freq: Three times a day (TID) | ORAL | Status: DC | PRN

## 2022-08-26 MED ORDER — PIPERACILLIN-TAZOBACTAM 3.375 G IVPB 30 MIN
3.3750 g | Freq: Once | INTRAVENOUS | Status: AC
  Administered 2022-08-26: 3.375 g via INTRAVENOUS
  Filled 2022-08-26: qty 50

## 2022-08-26 MED ORDER — MAGNESIUM OXIDE -MG SUPPLEMENT 400 (240 MG) MG PO TABS
400.0000 mg | ORAL_TABLET | Freq: Every day | ORAL | Status: DC
  Administered 2022-08-27: 400 mg via ORAL
  Filled 2022-08-26: qty 1

## 2022-08-26 MED ORDER — PRO-STAT SUGAR FREE PO LIQD
30.0000 mL | Freq: Two times a day (BID) | ORAL | Status: DC
  Administered 2022-08-27: 30 mL via ORAL

## 2022-08-26 MED ORDER — SODIUM CHLORIDE 0.9 % IV SOLN: 1.0000 g | INTRAVENOUS | Status: DC

## 2022-08-26 MED ORDER — HEPARIN BOLUS VIA INFUSION
4000.0000 [IU] | Freq: Once | INTRAVENOUS | Status: AC
  Administered 2022-08-26: 4000 [IU] via INTRAVENOUS
  Filled 2022-08-26: qty 4000

## 2022-08-26 MED ORDER — VITAMIN B-12 1000 MCG PO TABS
1000.0000 ug | ORAL_TABLET | Freq: Every day | ORAL | Status: DC
  Administered 2022-08-27: 1000 ug via ORAL
  Filled 2022-08-26: qty 1

## 2022-08-26 MED ORDER — ONDANSETRON HCL 4 MG PO TABS: 4.0000 mg | ORAL_TABLET | Freq: Four times a day (QID) | ORAL | Status: DC | PRN

## 2022-08-26 MED ORDER — BISACODYL 5 MG PO TBEC: 5.0000 mg | DELAYED_RELEASE_TABLET | Freq: Every day | ORAL | Status: DC | PRN

## 2022-08-26 MED ORDER — VANCOMYCIN HCL IN DEXTROSE 1-5 GM/200ML-% IV SOLN
1000.0000 mg | INTRAVENOUS | Status: DC
  Filled 2022-08-26: qty 200

## 2022-08-26 MED ORDER — LISINOPRIL 10 MG PO TABS
10.0000 mg | ORAL_TABLET | Freq: Every day | ORAL | Status: DC
  Administered 2022-08-27: 10 mg via ORAL
  Filled 2022-08-26: qty 1

## 2022-08-26 MED ORDER — LACTATED RINGERS IV BOLUS
1000.0000 mL | Freq: Once | INTRAVENOUS | Status: AC
  Administered 2022-08-26: 1000 mL via INTRAVENOUS

## 2022-08-26 MED ORDER — FERROUS SULFATE 325 (65 FE) MG PO TABS
325.0000 mg | ORAL_TABLET | ORAL | Status: DC
  Administered 2022-08-27: 325 mg via ORAL
  Filled 2022-08-26: qty 1

## 2022-08-26 MED ORDER — ACETAMINOPHEN 325 MG PO TABS: 650.0000 mg | ORAL_TABLET | Freq: Four times a day (QID) | ORAL | Status: DC | PRN

## 2022-08-26 MED ORDER — ATORVASTATIN CALCIUM 20 MG PO TABS
80.0000 mg | ORAL_TABLET | Freq: Every day | ORAL | Status: DC
  Administered 2022-08-26: 80 mg via ORAL
  Filled 2022-08-26: qty 4

## 2022-08-26 MED ORDER — VITAMIN C 500 MG PO TABS
500.0000 mg | ORAL_TABLET | Freq: Two times a day (BID) | ORAL | Status: DC
  Administered 2022-08-26 – 2022-08-27 (×2): 500 mg via ORAL
  Filled 2022-08-26 (×2): qty 1

## 2022-08-26 NOTE — Consult Note (Signed)
ORTHOPAEDIC CONSULTATION  REQUESTING PHYSICIAN: Pennie Banter, DO  Chief Complaint: Concern for infection left foot  HPI: [name] is a 89 y.o. female who presents with hyperglycemia from outpatient SNF facility.  Upon arrival had marked elevated white blood cell count.  Noted gangrenous change of her great toe when she has a wound VAC on her left foot from fifth ray amputation performed by myself.  Concern for possible infection from the left foot.  Past Medical History:  Diagnosis Date   Carpal tunnel syndrome on both sides    Diabetes (HCC)    Diabetic polyneuropathy (HCC)    Diverticulitis    Hyperlipidemia    Hypertension    Osteoarthritis    Vaginal prolapse    Past Surgical History:  Procedure Laterality Date   AMPUTATION TOE Left 08/13/2022   Procedure: EXCISION OF 5TH RAY;  Surgeon: Gwyneth Revels, DPM;  Location: [facility] OR[facility];  Service: Orthopedics/Podiatry;  Laterality: Left;   CESAREAN SECTION     IRRIGATION AND DEBRIDEMENT FOOT Left 01/09/2022   Procedure: IRRIGATION AND DEBRIDEMENT FOOT WITH BONE BIOPSY;  Surgeon: Gwyneth Revels, DPM;  Location: [facility] OR[facility];  Service: Podiatry;  Laterality: Left;   LOWER EXTREMITY ANGIOGRAPHY Left 06/11/2021   Procedure: Lower Extremity Angiography;  Surgeon: Renford Dills, MD;  Location: [facility];  Service: Cardiovascular;  Laterality: Left;   LOWER EXTREMITY ANGIOGRAPHY Left 08/11/2022   Procedure: Lower Extremity Angiography;  Surgeon: Renford Dills, MD;  Location: [facility];  Service: Cardiovascular;  Laterality: Left;   pessary     REPLACEMENT TOTAL KNEE BILATERAL     VENTRAL HERNIA REPAIR     VENTRAL HERNIA REPAIR N/A 01/13/2016   Procedure: HERNIA REPAIR VENTRAL ADULT WITH SMALL BOWEL RESECTION, LYSIS OF ADHESIONS;  Surgeon: Gladis Riffle, MD;  Location: [facility] OR[facility];  Service: General;  Laterality: N/A;   Social History   Socioeconomic History   Marital status: Widowed    Spouse name:  Not on file   Number of children: Not on file   Years of education: Not on file   Highest education level: Not on file  Occupational History   Not on file  Tobacco Use   Smoking status: Never   Smokeless tobacco: Never  Vaping Use   Vaping status: Never Used  Substance and Sexual Activity   Alcohol use: No   Drug use: No   Sexual activity: Not Currently  Other Topics Concern   Not on file  Social History Narrative   Lives at home by herself. Ambulates with a cane.   Social Determinants of Health   Financial Resource Strain: Not on file  Food Insecurity: No Food Insecurity (08/19/2022)   Hunger Vital Sign    Worried About Running Out of Food in the Last Year: Never true    Ran Out of Food in the Last Year: Never true  Transportation Needs: No Transportation Needs (08/19/2022)   PRAPARE - Administrator, Civil Service (Medical): No    Lack of Transportation (Non-Medical): No  Physical Activity: Not on file  Stress: Not on file  Social Connections: Not on file   Family History  Problem Relation Age of Onset   Breast cancer Mother 2   Breast cancer Maternal Aunt    Allergies  Allergen Reactions   Metformin And Related Nausea And Vomiting   Prior to Admission medications   Medication Sig Start Date End Date Taking? Authorizing Provider  acetaminophen (TYLENOL) 325  MG tablet Take 2 tablets (650 mg total) by mouth every 6 (six) hours as needed for mild pain, moderate pain, fever or headache. 01/25/16   Loflin, Laney Potash, MD  Amino Acids-Protein Hydrolys (FEEDING SUPPLEMENT, PRO-STAT SUGAR FREE 64,) LIQD Take 30 mLs by mouth in the morning and at bedtime.    [provider]  amoxicillin (AMOXIL) 500 MG capsule Take 1 capsule (500 mg total) by mouth every 8 (eight) hours for 13 days. 08/21/22 09/03/22  Marrion Coy, MD  ascorbic acid (VITAMIN C) 500 MG tablet Take 500 mg by mouth 2 (two) times daily.    [provider]  aspirin EC 81 MG tablet Take  81 mg by mouth daily. Swallow whole.    [provider]  atorvastatin (LIPITOR) 80 MG tablet Take 1 tablet (80 mg total) by mouth daily. Patient taking differently: Take 80 mg by mouth at bedtime. 09/26/19   Regalado, Belkys A, MD  clopidogrel (PLAVIX) 75 MG tablet Take 1 tablet (75 mg total) by mouth daily. 09/26/19   Regalado, Belkys A, MD  cyanocobalamin (VITAMIN B12) 1000 MCG tablet Take 1,000 mcg by mouth daily.    [provider]  ferrous sulfate 325 (65 FE) MG tablet Take 325 mg by mouth every other day.    [provider]  glimepiride (AMARYL) 4 MG tablet Take 4 mg by mouth daily with breakfast. 07/29/22   [provider]  lisinopril (ZESTRIL) 10 MG tablet Take 10 mg by mouth daily. 08/01/22   [provider]  magnesium oxide (MAG-OX) 400 (240 Mg) MG tablet Take 400 mg by mouth daily.    [provider]  Multiple Vitamins-Minerals (MULTIVITAMIN WITH MINERALS) tablet Take 1 tablet by mouth daily.    [provider]  oxyCODONE (OXY IR/ROXICODONE) 5 MG immediate release tablet Take 1 tablet (5 mg total) by mouth 3 (three) times daily with meals as needed for moderate pain, severe pain or breakthrough pain. 08/21/22   Marrion Coy, MD  Vitamin D, Ergocalciferol, (DRISDOL) 1.25 MG (50000 UNIT) CAPS capsule Take 50,000 Units by mouth every 7 (seven) days. thursday    [provider]   DG Foot 2 Views Left  Result Date: 08/26/2022 CLINICAL DATA:  Left toe necrosis, osteomyelitis, history of fifth toe removal EXAM: LEFT FOOT - 2 VIEW COMPARISON:  CT of the left foot 08/07/2022 FINDINGS: Postoperative change of fifth toe amputation at the TMT joint. Wound VAC about the lateral foot. There is mild haziness about the lateral cortex of the fourth metatarsal. This is indeterminate for postoperative change versus developing osteomyelitis. Plantar calcaneal spur. Demineralization. Vascular calcifications. IMPRESSION: Mild haziness about the  lateral cortex of the fourth metatarsal. This is indeterminate for postoperative change versus developing osteomyelitis. Consider MRI for further evaluation. Electronically Signed   By: Minerva Fester M.D.   On: 08/26/2022 17:07    Positive ROS: All other systems have been reviewed and were otherwise negative with the exception of those mentioned in the HPI and as above.  12 point ROS was performed.  Physical Exam: General: Alert and oriented.  No apparent distress.  Vascular:  Left foot:Dorsalis Pedis:  absent Posterior Tibial:  absent  Right foot: Dorsalis Pedis:  absent Posterior Tibial:  absent  Neuro:absent  Derm: There is dry gangrenous changes to the left great toe.  The very distal aspect of the left second toe has a very small superficial gangrenous area.  The lateral wound along the fifth ray amputation has no gangrenous  changes.  There is actually nice healthy granular tissue starting to form along the wound site.  No surrounding erythema.  No foul odor.  No purulence.  No lymphangitic streaking.  Ortho/MS: She is status post left fifth ray amputation  Assessment: Recent osteomyelitis left fifth metatarsal status post fifth ray amputation.   Plan: The lateral foot wound looks to be stable.  There is no signs of active infection from this region at this time.  There is dry gangrenous changes of the great toe and second toe which are stable.  At this point will continue with local wound care.  Will apply a dry gauze dressing for now but try to reestablish wound VAC once she is fully admitted to the hospital.  Podiatry will follow peripherally at this point.     Irean Hong, DPM Cell [phone]   08/26/2022 5:31 PM

## 2022-08-26 NOTE — ED Triage Notes (Signed)
Pt here from [facility] c/o hyperglycemia. Pt denies pain or any other symptoms.

## 2022-08-26 NOTE — Assessment & Plan Note (Signed)
Continue home regimen PRN hydralazine PO for SBP>160

## 2022-08-26 NOTE — Assessment & Plan Note (Signed)
Reported at [facility] but unknown how low.  EMS reported initial glucose 73.   --Hold glimepiride and consider stopping at d/c --Very sensitive sliding scale Novolog for now --Monitor CBG's --Hypoglycemia protocol

## 2022-08-26 NOTE — Assessment & Plan Note (Addendum)
With Left Hemiparesis Dementia without Behavioral Disturbance Continue home meds Requires hoyer lift for transfers On ASA Plavix and statin

## 2022-08-26 NOTE — Assessment & Plan Note (Signed)
Hoyer for transfers

## 2022-08-26 NOTE — ED Provider Notes (Signed)
[facility] Provider Note    Event Date/Time   First MD Initiated Contact with Patient 08/26/22 1530     (approximate)   History   Chief Complaint Hypoglycemia   HPI  [name] is a 89 y.o. female with past medical history of hypertension, hyperlipidemia, diabetes, CVA with left hemiparesis, PAD, and dementia who presents to the ED for hyperglycemia.  History is limited as patient is not sure why she is here.  Per EMS, [facility] arrives from [facility] with hypoglycemia, found to have a blood glucose of 73 with EMS.  Patient currently denies any complaints, does arrive with dressing and wound VAC in place to her left foot.  Patient denies significant pain to the foot, also denies any fevers, cough, chest pain, shortness of breath, abdominal pain, nausea, or vomiting.     Physical Exam   Triage Vital Signs: ED Triage Vitals  Encounter Vitals Group     BP 08/26/22 1412 (!) 141/59     Systolic BP Percentile --      Diastolic BP Percentile --      Pulse Rate 08/26/22 1412 66     Resp 08/26/22 1412 16     Temp 08/26/22 1412 98.1 F (36.7 C)     Temp Source 08/26/22 1412 Oral     SpO2 08/26/22 1412 100 %     Weight 08/26/22 1414 149 lb 14.6 oz (68 kg)     Height 08/26/22 1414 5\' 6"  (1.676 m)     Head Circumference --      [facility] --      Pain Score 08/26/22 1413 0     Pain Loc --      Pain Education --      Exclude from Growth Chart --     Most recent vital signs: Vitals:   08/26/22 1630 08/26/22 1700  BP: (!) 168/71 (!) 173/78  Pulse: 91 74  Resp: 17 14  Temp:    SpO2: 100% 100%    Constitutional: Awake and alert. Eyes: Conjunctivae are normal. Head: Atraumatic. Nose: No congestion/rhinnorhea. Mouth/Throat: Mucous membranes are moist.  Cardiovascular: Normal rate, regular rhythm. Grossly normal heart sounds.  2+ radial pulses bilaterally.  Unable to palpate DP pulse on left, 1+ DP pulse on right. Respiratory: Normal  respiratory effort.  No retractions. Lungs CTAB. Gastrointestinal: Soft and nontender. No distention. Musculoskeletal: Left foot is erythematous and warm to touch, extending up to the mid shin.  Necrotic appearing left great toe, necrosis also affecting the tip of left second toe.  Wound VAC in place to the lateral foot with no purulent drainage. Neurologic:  Normal speech and language. No gross focal neurologic deficits are appreciated.    ED Results / Procedures / Treatments   Labs (all labs ordered are listed, but only abnormal results are displayed) Labs Reviewed  CBC - Abnormal; Notable for the following components:      Result Value   WBC 22.4 (*)    RBC 3.59 (*)    Hemoglobin 10.7 (*)    HCT 34.2 (*)    Platelets 460 (*)    All other components within normal limits  COMPREHENSIVE METABOLIC PANEL - Abnormal; Notable for the following components:   Potassium 3.1 (*)    Glucose, Bld 68 (*)    Calcium 8.7 (*)    Albumin 3.1 (*)    All other components within normal limits  CULTURE, BLOOD (ROUTINE X 2)  CULTURE, BLOOD (ROUTINE  X 2)  LACTIC ACID, PLASMA  PROCALCITONIN  MAGNESIUM  PROTIME-INR  APTT  LACTIC ACID, PLASMA  CBG MONITORING, ED  CBG MONITORING, ED     EKG  ED ECG REPORT I, Chesley Noon, the attending physician, personally viewed and interpreted this ECG.   Date: 08/26/2022  EKG Time: 16:12  Rate: 69  Rhythm: normal sinus rhythm  Axis: Normal  Intervals:none  ST&T Change: None  RADIOLOGY Left foot x-ray reviewed and interpreted by me with no fracture or dislocation.  PROCEDURES:  Critical Care performed: Yes, see critical care procedure note(s)  .Critical Care  Performed by: Chesley Noon, MD Authorized by: Chesley Noon, MD   Critical care provider statement:    Critical care time (minutes):  30   Critical care time was exclusive of:  Separately billable procedures and treating other patients and teaching time   Critical care was  necessary to treat or prevent imminent or life-threatening deterioration of the following conditions:  Circulatory failure and sepsis   Critical care was time spent personally by me on the following activities:  Development of treatment plan with patient or surrogate, discussions with consultants, evaluation of patient's response to treatment, examination of patient, ordering and review of laboratory studies, ordering and review of radiographic studies, ordering and performing treatments and interventions, pulse oximetry, re-evaluation of patient's condition and review of old charts   I assumed direction of critical care for this patient from another provider in my specialty: no     Care discussed with: admitting provider      MEDICATIONS ORDERED IN ED: Medications  vancomycin (VANCOCIN) IVPB 1000 mg/200 mL premix (1,000 mg Intravenous New Bag/Given 08/26/22 1701)  sodium chloride 0.9 % bolus 500 mL (0 mLs Intravenous Stopped 08/26/22 1651)  piperacillin-tazobactam (ZOSYN) IVPB 3.375 g (0 g Intravenous Stopped 08/26/22 1651)  lactated ringers bolus 1,000 mL (1,000 mLs Intravenous New Bag/Given 08/26/22 1620)  potassium chloride SA (KLOR-CON M) CR tablet 40 mEq (40 mEq Oral Given 08/26/22 1623)     IMPRESSION / MDM / ASSESSMENT AND PLAN / ED COURSE  I reviewed the triage vital signs and the nursing notes.                              89 y.o. female with past medical history of hypertension, hyperlipidemia, diabetes, stroke with left hemiparesis, PAD, and dementia who presents to the ED with hyperglycemia noted at her nursing facility earlier today.  Patient's presentation is most consistent with acute presentation with potential threat to life or bodily function.  Differential diagnosis includes, but is not limited to, sepsis, limb ischemia, cellulitis, osteomyelitis, hypoglycemia, AKI, electrolyte abnormality, UTI.  Patient chronically ill but nontoxic-appearing and in no acute distress, vital  signs are unremarkable.  She does have evidence of ischemia to her left foot and I am unable to palpate a DP pulse, majority of her left great toe appears necrotic along with the tip of the second toe.  Initial labs remarkable for leukocytosis and with her hypoglycemia, presentation concerning for sepsis.  We will cover for possible osteomyelitis with IV vancomycin and Zosyn, further assess with x-ray.  No significant anemia, electrolyte abnormality, or AKI noted.  Blood glucose improved at 77 on recheck.  Case discussed with Dr. Gilda Crease of vascular surgery, who will evaluate the patient and we will start her on IV heparin for now.  He request that podiatry also be consulted and I spoke with Dr.  Fowler.  Additional labs show lactic acid within normal limits although procalcitonin is elevated, which is concerning for sepsis.  Magnesium level within normal limits.  Patient started on broad-spectrum antibiotics to cover for osteomyelitis.  Case discussed with hospitalist for admission.      FINAL CLINICAL IMPRESSION(S) / ED DIAGNOSES   Final diagnoses:  Ischemic foot  Osteomyelitis of left foot, unspecified type (HCC)  Sepsis without acute organ dysfunction, due to unspecified organism (HCC)  Hypoglycemia     Rx / DC Orders   ED Discharge Orders     None        Note:  This document was prepared using Dragon voice recognition software and may include unintentional dictation errors.   Chesley Noon, MD 08/26/22 1705

## 2022-08-26 NOTE — Assessment & Plan Note (Addendum)
Peripheral Artery Disease Osteomyelitis Dry gangrene - left great and 2nd toes Recent admission underwent left foot 5th ray amputation for osteomyelitis due to PAD.  Discharged with wound vac.  Now presents with gangrenous changes of left great & 2nd toes.  --Podiatry consulted - recommending continued wound care, gangrenous changes felt to be stable no further surgery planned for now --Vascular surgery contacted by EDP, no repeat interventions needed since s/p angiogram and intervention on 08/11/22.   --Continue ASA & Plavix  Leukocytosis / Osteomyelitis  She was discharged 7/12 on PO amoxicillin. WBC 22.4 k on admission, up from 7.4 at dc. No other SIRS criteria - not septic at this time. Normal lactate on admission 1.5.  Procal 2.96 --Monitor closely, fever curve, CBC, trend procal --IV Vanc & Rocephin for now with low threshold to de-escalate tomorrow if WBC improving. --Check UA and blood culture --Defer CXR, pt has no respiratory sx's

## 2022-08-26 NOTE — Progress Notes (Addendum)
ANTICOAGULATION CONSULT NOTE - Initial Consult  Pharmacy Consult for IV heparin Indication:  limb ischemia  Allergies  Allergen Reactions   Metformin And Related Nausea And Vomiting    Patient Measurements: Height: 5\' 6"  (167.6 cm) Weight: 68 kg (149 lb 14.6 oz) IBW/kg (Calculated) : 59.3 Heparin Dosing Weight: 68 kg  Vital Signs: Temp: 98.1 F (36.7 C) (07/16 1412) Temp Source: Oral (07/16 1412) BP: 141/59 (07/16 1412) Pulse Rate: 66 (07/16 1412)  Labs: Recent Labs    08/26/22 1418  HGB 10.7*  HCT 34.2*  PLT 460*  CREATININE 0.75    Estimated Creatinine Clearance: 47.3 mL/min (by C-G formula based on SCr of 0.75 mg/dL).   Medical History: Past Medical History:  Diagnosis Date   Carpal tunnel syndrome on both sides    Diabetes (HCC)    Diabetic polyneuropathy (HCC)    Diverticulitis    Hyperlipidemia    Hypertension    Osteoarthritis    Vaginal prolapse     Medications:  No prior to admission anticoagulation  Assessment: 89 year old female admitted with limb ischemia. PMH includes HLD, HTN, DM, CVA, PAD.  Baseline H&H stable. aPTT and PT-INR in process  Goal of Therapy:  Heparin level 0.3-0.7 units/ml Monitor platelets by anticoagulation protocol: Yes   Plan:  Give 4000 units bolus x 1 Start heparin infusion at 1000 units/hr Check anti-Xa level in 8 hours and daily while on heparin Continue to monitor H&H and platelets   Elliot Gurney, PharmD, BCPS Clinical Pharmacist  08/26/2022 4:22 PM

## 2022-08-26 NOTE — Op Note (Signed)
MRN : [id number]  [name] is a 89 y.o. (10-Mar-1933) female who presents with chief complaint of check circulation.  History of Present Illness:   I am asked to evaluate the patient by Dr. Larinda Buttery.  Patient is an 89 year old woman known to our service.  She is post angiogram with intervention on 08/11/2022.   Procedure:  Percutaneous transluminal angioplasty left superficial femoral and popliteal arteries to 6 mm Percutaneous transluminal angioplasty left tibioperoneal trunk and peroneal to 3 mm  She is not the best historian but at this time she denies pain in her left lower extremity or foot.  No new ulcers or wounds have occurred since the last visit.  Podiatry has evaluated her existing wounds and feels they are stable.  There have been no significant interval changes to the patient's overall health care.    No outpatient medications have been marked as taking for the 08/26/22 encounter [facility] Encounter).    Past Medical History:  Diagnosis Date   Carpal tunnel syndrome on both sides    Diabetes (HCC)    Diabetic polyneuropathy (HCC)    Diverticulitis    Hyperlipidemia    Hypertension    Osteoarthritis    Vaginal prolapse     Past Surgical History:  Procedure Laterality Date   AMPUTATION TOE Left 08/13/2022   Procedure: EXCISION OF 5TH RAY;  Surgeon: Gwyneth Revels, DPM;  Location: [facility] ORS;  Service: Orthopedics/Podiatry;  Laterality: Left;   CESAREAN SECTION     IRRIGATION AND DEBRIDEMENT FOOT Left 01/09/2022   Procedure: IRRIGATION AND DEBRIDEMENT FOOT WITH BONE BIOPSY;  Surgeon: Gwyneth Revels, DPM;  Location: [facility] ORS;  Service: Podiatry;  Laterality: Left;   LOWER EXTREMITY ANGIOGRAPHY Left 06/11/2021   Procedure: Lower Extremity Angiography;  Surgeon: Renford Dills, MD;  Location: [facility] INVASIVE CV LAB;  Service: Cardiovascular;  Laterality: Left;   LOWER EXTREMITY ANGIOGRAPHY Left  08/11/2022   Procedure: Lower Extremity Angiography;  Surgeon: Renford Dills, MD;  Location: [facility] INVASIVE CV LAB;  Service: Cardiovascular;  Laterality: Left;   pessary     REPLACEMENT TOTAL KNEE BILATERAL     VENTRAL HERNIA REPAIR     VENTRAL HERNIA REPAIR N/A 01/13/2016   Procedure: HERNIA REPAIR VENTRAL ADULT WITH SMALL BOWEL RESECTION, LYSIS OF ADHESIONS;  Surgeon: Gladis Riffle, MD;  Location: [facility] ORS;  Service: General;  Laterality: N/A;    Social History Social History   Tobacco Use   Smoking status: Never   Smokeless tobacco: Never  Vaping Use   Vaping status: Never Used  Substance Use Topics   Alcohol use: No   Drug use: No    Family History Family History  Problem Relation Age of Onset   Breast cancer Mother 47   Breast cancer Maternal Aunt     Allergies  Allergen Reactions   Metformin And Related Nausea And Vomiting     REVIEW OF SYSTEMS (Negative unless checked)  Constitutional: [] Weight loss  [] Fever  [] Chills Cardiac: [] Chest pain   [] Chest pressure   [] Palpitations   [] Shortness of breath when laying flat   [] Shortness  of breath with exertion. Vascular:  [x] Pain in legs with walking   [] Pain in legs at rest  [] History of DVT   [] Phlebitis   [] Swelling in legs   [] Varicose veins   [] Non-healing ulcers Pulmonary:   [] Uses home oxygen   [] Productive cough   [] Hemoptysis   [] Wheeze  [] COPD   [] Asthma Neurologic:  [] Dizziness   [] Seizures   [] History of stroke   [] History of TIA  [] Aphasia   [] Vissual changes   [] Weakness or numbness in arm   [] Weakness or numbness in leg Musculoskeletal:   [] Joint swelling   [] Joint pain   [] Low back pain Hematologic:  [] Easy bruising  [] Easy bleeding   [] Hypercoagulable state   [] Anemic Gastrointestinal:  [] Diarrhea   [] Vomiting  [] Gastroesophageal reflux/heartburn   [] Difficulty swallowing. Genitourinary:  [] Chronic kidney disease   [] Difficult urination  [] Frequent urination   [] Blood in urine Skin:  [] Rashes    [] Ulcers  Psychological:  [] History of anxiety   []  History of major depression.  Physical Examination  Vitals:   08/26/22 1414 08/26/22 1630 08/26/22 1700 08/26/22 1730  BP:  (!) 168/71 (!) 173/78 (!) 178/96  Pulse:  91 74 79  Resp:  17 14 18   Temp:      TempSrc:      SpO2:  100% 100% 100%  Weight: 68 kg     Height: 5\' 6"  (1.676 m)      Body mass index is 24.2 kg/m. Gen: WD/poorly nourished, NAD but she is lying in bed in the fetal position Head: [place]/AT, marked temporalis wasting.  Ear/Nose/Throat: Hearing grossly intact, nares w/o erythema or drainage Eyes: PER, EOMI, sclera nonicteric.  Neck: Supple, no masses.  No bruit or JVD.  Pulmonary:  Good air movement, no audible wheezing, no use of accessory muscles.  Cardiac: RRR, normal S1, S2, no Murmurs. Vascular: Dry gangrenous changes of the left first and second toes which are stable and without odor open wound left fifth ray stable.  No ascending cellulitis noted the left foot is warm to the touch with brisk capillary refill this is actually better when compared to the right. Vessel Right Left  Radial Palpable Palpable  PT Not Palpable Not Palpable  DP Not Palpable Not Palpable  Gastrointestinal: soft, non-distended. No guarding/no peritoneal signs.  Musculoskeletal: Significant flexion contractures throughout the left side.  No visible atrophy deformity.  Neurologic: CN 2-12 intact. Pain and light touch intact in extremities.  Symmetrical.  Speech is fluent. Motor exam as listed above. Psychiatric: Judgment intact, Mood & affect appropriate for pt's clinical situation. Dermatologic: No rashes or ulcers noted.  No changes consistent with cellulitis.   CBC Lab Results  Component Value Date   WBC 22.4 (H) 08/26/2022   HGB 10.7 (L) 08/26/2022   HCT 34.2 (L) 08/26/2022   MCV 95.3 08/26/2022   PLT 460 (H) 08/26/2022    BMET    Component Value Date/Time   NA 139 08/26/2022 1418   NA 143 09/24/2013 0409   K 3.1 (L)  08/26/2022 1418   K 3.6 09/24/2013 0409   CL 103 08/26/2022 1418   CL 108 (H) 09/24/2013 0409   CO2 26 08/26/2022 1418   CO2 27 09/24/2013 0409   GLUCOSE 68 (L) 08/26/2022 1418   GLUCOSE 222 (H) 09/24/2013 0409   BUN 16 08/26/2022 1418   BUN 7 09/24/2013 0409   CREATININE 0.75 08/26/2022 1418   CREATININE 0.77 09/24/2013 0409   CALCIUM 8.7 (L) 08/26/2022 1418   CALCIUM  7.7 (L) 09/24/2013 0409   GFRNONAA >60 08/26/2022 1418   GFRNONAA >60 09/24/2013 0409   GFRAA >60 09/26/2019 1952   GFRAA >60 09/24/2013 0409   Estimated Creatinine Clearance: 47.3 mL/min (by C-G formula based on SCr of 0.75 mg/dL).  COAG Lab Results  Component Value Date   INR 1.2 08/26/2022   INR 1.2 08/08/2022    Radiology DG Foot 2 Views Left  Result Date: 08/26/2022 CLINICAL DATA:  Left toe necrosis, osteomyelitis, history of fifth toe removal EXAM: LEFT FOOT - 2 VIEW COMPARISON:  CT of the left foot 08/07/2022 FINDINGS: Postoperative change of fifth toe amputation at the TMT joint. Wound VAC about the lateral foot. There is mild haziness about the lateral cortex of the fourth metatarsal. This is indeterminate for postoperative change versus developing osteomyelitis. Plantar calcaneal spur. Demineralization. Vascular calcifications. IMPRESSION: Mild haziness about the lateral cortex of the fourth metatarsal. This is indeterminate for postoperative change versus developing osteomyelitis. Consider MRI for further evaluation. Electronically Signed   By: Minerva Fester M.D.   On: 08/26/2022 17:07   PERIPHERAL VASCULAR CATHETERIZATION  Result Date: 08/11/2022 See surgical note for result.  CT FOOT LEFT W CONTRAST  Result Date: 08/07/2022 CLINICAL DATA:  Osteomyelitis, rule out abscess. EXAM: CT OF THE LOWER LEFT EXTREMITY WITH CONTRAST TECHNIQUE: Multidetector CT imaging of the lower left extremity was performed according to the standard protocol following intravenous contrast administration. RADIATION DOSE  REDUCTION: This exam was performed according to the departmental dose-optimization program which includes automated exposure control, adjustment of the mA and/or kV according to patient size and/or use of iterative reconstruction technique. CONTRAST:  OMNIPAQUE IOHEXOL 300 MG/ML  SOLN COMPARISON:  None Available. FINDINGS: Bones/Joint/Cartilage Sclerosis of the base of the fifth metatarsal and proximal shaft, adjacent to the deep skin wound concerning for osteomyelitis. There is severe osteopenia.  No acute fracture or dislocation. Ligaments Suboptimally assessed by CT. Muscles and Tendons Flexor and extensor tendons appear intact. Achilles tendon appear intact. Soft tissues Marked skin thickening and subcutaneous soft tissue edema about the ankle and foot. Deep skin wound about the lateral aspect of the base of the fifth metatarsal. No fluid collection or abscess identified. IMPRESSION: 1. Deep skin wound about the lateral aspect of the base of the fifth metatarsal. Sclerosis of the base of the fifth metatarsal and proximal shaft, adjacent to the deep skin wound, concerning for osteomyelitis. 2. Marked skin thickening and subcutaneous soft tissue edema about the ankle and foot, consistent with cellulitis. No fluid collection or abscess identified. 3. Severe osteopenia. Electronically Signed   By: Larose Hires D.O.   On: 08/07/2022 20:58   DG Chest Port 1 View  Result Date: 08/07/2022 CLINICAL DATA:  Preoperative study. EXAM: PORTABLE CHEST 1 VIEW COMPARISON:  Chest x-ray dated January 07, 2022. FINDINGS: The heart size and mediastinal contours are within normal limits. Normal pulmonary vascularity. No focal consolidation, pleural effusion, or pneumothorax. No acute osseous abnormality. IMPRESSION: No active disease. Electronically Signed   By: Obie Dredge M.D.   On: 08/07/2022 20:30   DG Foot 2 Views Left  Result Date: 08/07/2022 CLINICAL DATA:  Open wound in skin, diabetes EXAM: LEFT FOOT - 2  VIEW COMPARISON:  01/07/2022 FINDINGS: No displaced fracture or dislocation is seen. Osteopenia is seen in bony structures. Possible erosion is seen in the lateral margin of base of fifth metatarsal. There is lucency overlying the base of fifth metatarsal, possibly suggesting open wound in the skin. There is flattening  of plantar arch. Plantar spur is seen in calcaneus. There is soft tissue swelling over the dorsum. Arterial calcifications are seen in the soft tissues. IMPRESSION: No recent fracture or dislocation is seen. There is erosive change in the lateral aspect of base of left fifth metatarsal. Possibility of osteomyelitis is not excluded. If clinically warranted, follow-up MRI may be considered. Electronically Signed   By: Ernie Avena M.D.   On: 08/07/2022 17:45     Assessment/Plan 1. Atherosclerosis of native arteries of the extremities with ulceration (HCC)  Recommend:   The patient has evidence of atherosclerosis of the lower extremities with left foot wounds as described above.  The wound seem to be stable.  Given the patient's flexion contraction and past history of total knee replacement imaging including noninvasive duplex ultrasound is extremely difficult.  Review of the angiogram from earlier this months demonstrates a excellent result.  Clinically her foot is warm and her wounds seem to be stable.  At this point in time I agree completely with podiatry and would favor continued conservative care and wound management.  If at any point there is increased concern regarding her arterial perfusion I believe angiography would be the only reasonable way to image this and so would like to continue observation for now before moving immediately in that direction.  Of note given her flexion contraction should we need to proceed to amputation she would be an obligatory above-the-knee amputation.    The patient should continue antiplatelet therapy and aggressive treatment of the lipid  abnormalities   No changes in the patient's medications at this time   Continued surveillance is indicated as atherosclerosis is likely to progress with time.        2.  Left foot ulcers: Recommend:   I concur with podiatry's evaluation I think continued conservative management and wound care is in order.  Clinically the patient appears to have a stable reconstruction from earlier this month and I would not advocate for reangiography immediately.    3. Essential hypertension Continue antihypertensive medications as already ordered, these medications have been reviewed and there are no changes at this time.    4. Type 2 diabetes mellitus with polyneuropathy (HCC) Continue hypoglycemic medications as already ordered, these medications have been reviewed and there are no changes at this time.   Hgb A1C to be monitored as already arranged by primary service    5. Mixed hyperlipidemia Continue statin as ordered and reviewed, no changes at this time    Levora Dredge, MD  08/26/2022 6:07 PM

## 2022-08-26 NOTE — ED Notes (Signed)
First nurse note: Pt here via [facility] from [facility] with c/o of hypoglycemia.   CBG 73   164/61 HR: 63 95%  18g R FA

## 2022-08-26 NOTE — H&P (Addendum)
History and Physical    Patient: [name]:[id number] DOB: [birth year] DOA: 08/26/2022 DOS: the patient was seen and examined on 08/26/2022 PCP: Gracelyn Nurse, MD  Patient coming from: SNF ([facility])  Chief Complaint:  Chief Complaint  Patient presents with   Hypoglycemia   HPI: [name] is a 89 y.o. female with medical history significant of unspecified dementia, type 2 diabetes, diabetic polyneuropathy, essential hypertension, dyslipidemia, peripheral arterial disease with recent admission 6/27--08/22/2022 for left foot osteomyelitis.  During that admission, she underwent revascularization of the left lower extremity with vascular surgery and subsequently fifth ray amputation of the left foot with wound VAC placement.  She was treated with broad-spectrum IV antibiotics, transitioned to p.o. amoxicillin for an additional 2 weeks and discharged to rehab [facility].  She was sent to the ED today when facility staff noted hypoglycemia.  Per EMS, glucose was found to be 73 on their arrival.  Patient is a poor historian, but when seen in the ED she reported overall feeling well.  She denied acute complaints including fevers or chills, malaise, fatigue, cough, sore throat, shortness of breath, chest pain, abdominal pain, nausea vomiting or diarrhea, dysuria.  ED evaluation: Temp 98.1 F, HR 66, RR 66, initial BP 141/59, later 168/71, O2 sat 100% on room air.  Complete metabolic panel was notable for potassium 3.1, glucose 68, calcium 8.7 albumin 3.1.  CBC notable for leukocytosis with WBC 22.4, hemoglobin 10.7 which is improved from 8.5 on 7/11, platelets 460.  Lactic acid was normal 1.5.  Procalcitonin elevated 2.96.  X-ray of left foot showed mild haziness about the lateral cortex of the fourth metatarsal, indeterminate for postoperative change versus developing osteomyelitis.  ED provider spoke with vascular surgery and podiatry and both have agreed to see the patient.  She is being  admitted to the hospital for further evaluation management.  Started on empiric IV antibiotics.   Review of Systems: As mentioned in the history of present illness. All other systems reviewed and are negative.   Past Medical History:  Diagnosis Date   Carpal tunnel syndrome on both sides    Diabetes (HCC)    Diabetic polyneuropathy (HCC)    Diverticulitis    Hyperlipidemia    Hypertension    Osteoarthritis    Vaginal prolapse    Past Surgical History:  Procedure Laterality Date   AMPUTATION TOE Left 08/13/2022   Procedure: EXCISION OF 5TH RAY;  Surgeon: Gwyneth Revels, DPM;  Location: [facility];  Service: Orthopedics/Podiatry;  Laterality: Left;   CESAREAN SECTION     IRRIGATION AND DEBRIDEMENT FOOT Left 01/09/2022   Procedure: IRRIGATION AND DEBRIDEMENT FOOT WITH BONE BIOPSY;  Surgeon: Gwyneth Revels, DPM;  Location: [facility];  Service: Podiatry;  Laterality: Left;   LOWER EXTREMITY ANGIOGRAPHY Left 06/11/2021   Procedure: Lower Extremity Angiography;  Surgeon: Renford Dills, MD;  Location: [facility];  Service: Cardiovascular;  Laterality: Left;   LOWER EXTREMITY ANGIOGRAPHY Left 08/11/2022   Procedure: Lower Extremity Angiography;  Surgeon: Renford Dills, MD;  Location: [facility];  Service: Cardiovascular;  Laterality: Left;   pessary     REPLACEMENT TOTAL KNEE BILATERAL     VENTRAL HERNIA REPAIR     VENTRAL HERNIA REPAIR N/A 01/13/2016   Procedure: HERNIA REPAIR VENTRAL ADULT WITH SMALL BOWEL RESECTION, LYSIS OF ADHESIONS;  Surgeon: Gladis Riffle, MD;  Location: [facility];  Service: General;  Laterality: N/A;   Social History:  reports that she  has never smoked. She has never used smokeless tobacco. She reports that she does not drink alcohol and does not use drugs.  Allergies  Allergen Reactions   Metformin And Related Nausea And Vomiting    Family History  Problem Relation Age of Onset   Breast cancer Mother 77   Breast cancer Maternal Aunt      Prior to Admission medications   Medication Sig Start Date End Date Taking? Authorizing Provider  acetaminophen (TYLENOL) 325 MG tablet Take 2 tablets (650 mg total) by mouth every 6 (six) hours as needed for mild pain, moderate pain, fever or headache. 01/25/16   Loflin, Laney Potash, MD  Amino Acids-Protein Hydrolys (FEEDING SUPPLEMENT, PRO-STAT SUGAR FREE 64,) LIQD Take 30 mLs by mouth in the morning and at bedtime.    [provider]  amoxicillin (AMOXIL) 500 MG capsule Take 1 capsule (500 mg total) by mouth every 8 (eight) hours for 13 days. 08/21/22 09/03/22  Marrion Coy, MD  ascorbic acid (VITAMIN C) 500 MG tablet Take 500 mg by mouth 2 (two) times daily.    [provider]  aspirin EC 81 MG tablet Take 81 mg by mouth daily. Swallow whole.    [provider]  atorvastatin (LIPITOR) 80 MG tablet Take 1 tablet (80 mg total) by mouth daily. Patient taking differently: Take 80 mg by mouth at bedtime. 09/26/19   Regalado, Belkys A, MD  clopidogrel (PLAVIX) 75 MG tablet Take 1 tablet (75 mg total) by mouth daily. 09/26/19   Regalado, Belkys A, MD  cyanocobalamin (VITAMIN B12) 1000 MCG tablet Take 1,000 mcg by mouth daily.    [provider]  ferrous sulfate 325 (65 FE) MG tablet Take 325 mg by mouth every other day.    [provider]  glimepiride (AMARYL) 4 MG tablet Take 4 mg by mouth daily with breakfast. 07/29/22   [provider]  lisinopril (ZESTRIL) 10 MG tablet Take 10 mg by mouth daily. 08/01/22   [provider]  magnesium oxide (MAG-OX) 400 (240 Mg) MG tablet Take 400 mg by mouth daily.    [provider]  Multiple Vitamins-Minerals (MULTIVITAMIN WITH MINERALS) tablet Take 1 tablet by mouth daily.    [provider]  oxyCODONE (OXY IR/ROXICODONE) 5 MG immediate release tablet Take 1 tablet (5 mg total) by mouth 3 (three) times daily with meals as needed for moderate pain, severe pain or breakthrough pain.  08/21/22   Marrion Coy, MD  Vitamin D, Ergocalciferol, (DRISDOL) 1.25 MG (50000 UNIT) CAPS capsule Take 50,000 Units by mouth every 7 (seven) days. thursday    [provider]    Physical Exam: Vitals:   08/26/22 1630 08/26/22 1700 08/26/22 1730 08/26/22 1819  BP: (!) 168/71 (!) 173/78 (!) 178/96 (!) 186/74  Pulse: 91 74 79 89  Resp: 17 14 18 19   Temp:      TempSrc:      SpO2: 100% 100% 100% 98%  Weight:      Height:       General exam: awake, alert, no acute distress, pleasantly confused HEENT: atraumatic, clear conjunctiva, anicteric sclera, moist mucus membranes Respiratory system: CTAB, no wheezes, rales or rhonchi, normal respiratory effort. Cardiovascular system: normal S1/S2, RRR, no pedal edema.   Gastrointestinal system: soft, NT, ND, +bowel sounds. Central nervous system: A&O x self and hospital. Left hemiparesis, normal speech Extremities: left great and 2nd toe are black gangrenous without visible drainage, no edema Skin: dry, intact, normal temperature Psychiatry: normal  mood, congruent affect, abnormal judgement and insight   Data Reviewed:  Notable labs as reviewed above  Assessment and Plan: * Gangrene of left foot (HCC) Peripheral Artery Disease Osteomyelitis Dry gangrene - left great and 2nd toes Recent admission underwent left foot 5th ray amputation for osteomyelitis due to PAD.  Discharged with wound vac.  Now presents with gangrenous changes of left great & 2nd toes.  --Podiatry consulted - recommending continued wound care, gangrenous changes felt to be stable no further surgery planned for now --Vascular surgery contacted by EDP, no repeat interventions needed since s/p angiogram and intervention on 08/11/22.   --Continue ASA & Plavix  Leukocytosis / Osteomyelitis  She was discharged 7/12 on PO amoxicillin. WBC 22.4 k on admission, up from 7.4 at dc. No other SIRS criteria - not septic at this time. Normal lactate on admission 1.5.  Procal  2.96 --Monitor closely, fever curve, CBC, trend procal --IV Vanc & Rocephin for now with low threshold to de-escalate tomorrow if WBC improving. --Check UA and blood culture --Defer CXR, pt has no respiratory sx's   Hypoglycemia Reported at SNF but unknown how low.  EMS reported initial glucose 73.   --Hold glimepiride and consider stopping at d/c --Very sensitive sliding scale Novolog for now --Monitor CBG's --Hypoglycemia protocol  PAD (peripheral artery disease) (HCC) .  Left hemiparesis (HCC) Hoyer for transfers  History of stroke With Left Hemiparesis Dementia without Behavioral Disturbance Continue home meds Requires hoyer lift for transfers On ASA Plavix and statin  Essential hypertension Continue home regimen PRN hydralazine PO for SBP>160  Type 2 diabetes mellitus with polyneuropathy (HCC) Hold glimepiride with hypoglycemia reported at SNF, reason she was sent to the ER. See hypoglycemia.      Advance Care Planning:   Code Status: DNR  Palliative care notes from prior admission reviewed. Detailed GOC discussions were had and code status was changed to DNR (as per Palliative note from 7/5)  Consults: Vascular surgery (Dr. Gilda Crease), Podiatry (Dr. Ether Griffins)  Family Communication: None present  Status is: Observation The patient remains OBS appropriate and will d/c before 2 midnights. Anticipate hypoglycemia will resolve quickly with holding sulfonylurea. Appears no surgeries or procedures will be indicated at this time per consultants.  We will monitor leukocytosis and clinically for signs of other infection.   Author: Pennie Banter, DO 08/26/2022 7:34 PM  For on call review [email or web address].

## 2022-08-26 NOTE — ED Notes (Addendum)
Podiatry removed wound vac and dressed foot with gauze and kerlex.

## 2022-08-26 NOTE — Assessment & Plan Note (Signed)
Hold glimepiride with hypoglycemia reported at SNF, reason she was sent to the ER. See hypoglycemia.

## 2022-08-26 NOTE — ED Notes (Signed)
Patient denies pain and is resting comfortably.  

## 2022-08-26 NOTE — Progress Notes (Signed)
CODE SEPSIS - PHARMACY COMMUNICATION  **Broad Spectrum Antibiotics should be administered within 1 hour of Sepsis diagnosis**  Time Code Sepsis Called/Page Received: 1550  Antibiotics Ordered: vancomycin, piperacillin/tazobactam  Time of 1st antibiotic administration: 1621  Additional action taken by pharmacy: none  If necessary, Name of Provider/Nurse Contacted: n/a    Elliot Gurney, PharmD, BCPS Clinical Pharmacist  08/26/2022 3:56 PM

## 2022-08-26 NOTE — Progress Notes (Signed)
Pharmacy Antibiotic Note  [name] is a 89 y.o. female admitted on 08/26/2022 with osteomyelitis.  Pharmacy has been consulted for Vancomycin dosing.  Plan: Vancomycin 1000 mg IV Q 24 hrs. Goal AUC 400-550. Expected AUC: 468.2 SCr used: 0.8 Expected Cmin: 11.5   Height: 5\' 6"  (167.6 cm) Weight: 68 kg (149 lb 14.6 oz) IBW/kg (Calculated) : 59.3  Temp (24hrs), Avg:98.1 F (36.7 C), Min:98.1 F (36.7 C), Max:98.1 F (36.7 C)  Recent Labs  Lab 08/20/22 0514 08/21/22 0320 08/26/22 1418 08/26/22 1617  WBC 7.4 8.7 22.4*  --   CREATININE 0.92 0.98 0.75  --   LATICACIDVEN  --   --   --  1.5    Estimated Creatinine Clearance: 47.3 mL/min (by C-G formula based on SCr of 0.75 mg/dL).    Allergies  Allergen Reactions   Metformin And Related Nausea And Vomiting    Antimicrobials this admission: Ceftriaxone 7/16 >>  Vancomycin 7/16 >>   Dose adjustments this admission:  Microbiology results:  Thank you for allowing pharmacy to be a part of this patient's care.  Clovia Cuff, PharmD, BCPS 08/26/2022 5:38 PM

## 2022-08-27 DIAGNOSIS — M869 Osteomyelitis, unspecified: Secondary | ICD-10-CM | POA: Diagnosis not present

## 2022-08-27 DIAGNOSIS — I96 Gangrene, not elsewhere classified: Secondary | ICD-10-CM | POA: Diagnosis not present

## 2022-08-27 DIAGNOSIS — I998 Other disorder of circulatory system: Secondary | ICD-10-CM

## 2022-08-27 DIAGNOSIS — R531 Weakness: Secondary | ICD-10-CM | POA: Diagnosis not present

## 2022-08-27 DIAGNOSIS — E162 Hypoglycemia, unspecified: Secondary | ICD-10-CM | POA: Diagnosis not present

## 2022-08-27 DIAGNOSIS — Z743 Need for continuous supervision: Secondary | ICD-10-CM | POA: Diagnosis not present

## 2022-08-27 LAB — BASIC METABOLIC PANEL
Anion gap: 9 (ref 5–15)
BUN: 14 mg/dL (ref 8–23)
CO2: 23 mmol/L (ref 22–32)
Calcium: 7.8 mg/dL — ABNORMAL LOW (ref 8.9–10.3)
Chloride: 103 mmol/L (ref 98–111)
Creatinine, Ser: 0.77 mg/dL (ref 0.44–1.00)
GFR, Estimated: >60 mL/min (ref >60)
Glucose, Bld: 96 mg/dL (ref 70–99)
Potassium: 3.6 mmol/L (ref 3.5–5.1)
Sodium: 135 mmol/L (ref 135–145)

## 2022-08-27 LAB — GLUCOSE, CAPILLARY
Glucose-Capillary: 89 mg/dL (ref 70–99)
Glucose-Capillary: 47 mg/dL — ABNORMAL LOW (ref 70–99)
Glucose-Capillary: 165 mg/dL — ABNORMAL HIGH (ref 70–99)
Glucose-Capillary: 148 mg/dL — ABNORMAL HIGH (ref 70–99)
Glucose-Capillary: 127 mg/dL — ABNORMAL HIGH (ref 70–99)
Glucose-Capillary: 133 mg/dL — ABNORMAL HIGH (ref 70–99)

## 2022-08-27 LAB — CBC
HCT: 25.7 % — ABNORMAL LOW (ref 36.0–46.0)
Hemoglobin: 8.2 g/dL — ABNORMAL LOW (ref 12.0–15.0)
MCH: 30 pg (ref 26.0–34.0)
MCHC: 31.9 g/dL (ref 30.0–36.0)
MCV: 94.1 fL (ref 80.0–100.0)
Platelets: 396 10*3/uL (ref 150–400)
RBC: 2.73 MIL/uL — ABNORMAL LOW (ref 3.87–5.11)
RDW: 15 % (ref 11.5–15.5)
WBC: 14.2 10*3/uL — ABNORMAL HIGH (ref 4.0–10.5)
nRBC: 0 % (ref 0.0–0.2)

## 2022-08-27 LAB — HEMOGLOBIN AND HEMATOCRIT, BLOOD
HCT: 26 % — ABNORMAL LOW (ref 36.0–46.0)
Hemoglobin: 8.4 g/dL — ABNORMAL LOW (ref 12.0–15.0)

## 2022-08-27 LAB — HEPARIN LEVEL (UNFRACTIONATED)
Heparin Unfractionated: >1.1 IU/mL — ABNORMAL HIGH (ref 0.30–0.70)
Heparin Unfractionated: 0.85 IU/mL — ABNORMAL HIGH (ref 0.30–0.70)

## 2022-08-27 MED ORDER — DEXTROSE 50 % IV SOLN
1.0000 | Freq: Once | INTRAVENOUS | Status: AC
  Administered 2022-08-27: 50 mL via INTRAVENOUS
  Filled 2022-08-27: qty 50

## 2022-08-27 MED ORDER — OXYCODONE HCL 5 MG PO TABS: 5.0000 mg | ORAL_TABLET | Freq: Three times a day (TID) | ORAL | 0 refills | Status: AC | PRN

## 2022-08-27 MED ORDER — DEXTROSE 5 % IV SOLN: INTRAVENOUS | Status: DC

## 2022-08-27 MED ORDER — HEPARIN (PORCINE) 25000 UT/250ML-% IV SOLN
800.0000 [IU]/h | INTRAVENOUS | Status: DC
  Administered 2022-08-27: 800 [IU]/h via INTRAVENOUS

## 2022-08-27 NOTE — Plan of Care (Signed)

## 2022-08-27 NOTE — Care Management CC44 (Signed)
Condition Code 44 Documentation Completed  Patient Details  Name: LIYAT FAULKENBERRY MRN: [id number] Date of Birth: [birth year]   Condition Code 44 given:  Yes Patient signature on Condition Code 44 notice:  Yes Documentation of 2 MD's agreement:  Yes Code 44 added to claim:  Yes  Telephone reviewed with [place] [facility].  Copy Secure emailed   Chapman Fitch, RN 08/27/2022, 11:19 AM

## 2022-08-27 NOTE — Discharge Summary (Signed)
Physician Discharge Summary  Patient: [name]:[id number] DOB: 1933/10/02   Code Status: DNR Admit date: 08/26/2022 Discharge date: 08/27/2022 Disposition: Skilled nursing facility, PT, OT, nurse aid, RN, and wound care PCP: Gracelyn Nurse, MD  Recommendations for Outpatient Follow-up:  Follow up with PCP within 1-2 weeks Regarding general hospital follow up and preventative care Recommend avoiding diabetic medications that will cause hypoglycemia Follow up with vascular surgery- recommends continued aspirin and plavix. May need reangiography and possible amputation in the future but is recommending surveillance at this time.  Follow up with podiatry- recommends continued wound vac and antibiotic treatment  Discharge Diagnoses:  Principal Problem:   Gangrene of left foot (HCC) Active Problems:   Type 2 diabetes mellitus with polyneuropathy (HCC)   Essential hypertension   UTI (urinary tract infection)   History of stroke   Left hemiparesis (HCC)   PAD (peripheral artery disease) (HCC)   Hypoglycemia  Brief Hospital Course Summary: [name] is a 89 y.o. female with medical history significant of unspecified dementia, type 2 diabetes, diabetic polyneuropathy, essential hypertension, dyslipidemia, peripheral arterial disease with recent admission 6/27--08/22/2022 for left foot osteomyelitis.  During that admission, she underwent revascularization of the left lower extremity with vascular surgery and subsequently fifth ray amputation of the left foot with wound VAC placement.  She was treated with broad-spectrum IV antibiotics, transitioned to p.o. amoxicillin for an additional 2 weeks and discharged to [facility].  She was sent to the ED today when facility staff noted hypoglycemia.  Per EMS, glucose was found to be 73 on their arrival.  Patient is a poor historian, but when seen in the ED she reported overall feeling well. Of note, she is on home medication of  glimepiride.    ED evaluation: Temp 98.1 F, HR 66, RR 66, initial BP 141/59, later 168/71, O2 sat 100% on room air.  Complete metabolic panel was notable for potassium 3.1, glucose 68, calcium 8.7 albumin 3.1.  CBC notable for leukocytosis with WBC 22.4, hemoglobin 10.7 which is improved from 8.5 on 7/11, platelets 460.  Lactic acid was normal 1.5.  Procalcitonin elevated 2.96.  X-ray of left foot showed mild haziness about the lateral cortex of the fourth metatarsal, indeterminate for postoperative change versus developing osteomyelitis. Started on empiric IV antibiotics and IV heparin gtt for possible limb ischemia. Podiatry evaluated and recommended to continue on with current antibiotic course without further intervention at this time. Wound vac was re-applied and will follow up outpatient.  Vascular surgery also evaluated and recommended similar. She was discontinued from the heparin gtt and will continue on with plavix and aspirin. She will have time of surveillance after recent angioplasty. May need future angiography and amputation but not at this time. She can follow up with them outpatient.   For her hypoglycemia- she had a low reading of 26 overnight at presentation but had since remained overall stable. Her glimepiride was discontinued. She ate small quantity of her food and oral intake increased with assistance in eating so would recommend help with meals. Would not recommend tight glucose control.   Discharge Condition: Good, improved Recommended discharge diet: Regular healthy diet  Consultations: Vascular surgery Podiatry   Procedures/Studies: None   Allergies as of 08/27/2022       Reactions   Metformin And Related Nausea And Vomiting        Medication List     STOP taking these medications    glimepiride 4 MG  tablet Commonly known as: AMARYL   naloxone 4 MG/0.1ML Liqd nasal spray kit Commonly known as: NARCAN       TAKE these medications    acetaminophen  325 MG tablet Commonly known as: TYLENOL Take 2 tablets (650 mg total) by mouth every 6 (six) hours as needed for mild pain, moderate pain, fever or headache.   amoxicillin 500 MG capsule Commonly known as: AMOXIL Take 1 capsule (500 mg total) by mouth every 8 (eight) hours for 13 days.   ascorbic acid 500 MG tablet Commonly known as: VITAMIN C Take 500 mg by mouth 2 (two) times daily.   aspirin EC 81 MG tablet Take 81 mg by mouth daily. Swallow whole.   atorvastatin 80 MG tablet Commonly known as: LIPITOR Take 1 tablet (80 mg total) by mouth daily. What changed: when to take this   clopidogrel 75 MG tablet Commonly known as: PLAVIX Take 1 tablet (75 mg total) by mouth daily.   cyanocobalamin 1000 MCG tablet Commonly known as: VITAMIN B12 Take 1,000 mcg by mouth daily.   feeding supplement (PRO-STAT SUGAR FREE 64) Liqd Take 30 mLs by mouth in the morning and at bedtime.   ferrous sulfate 325 (65 FE) MG tablet Take 325 mg by mouth every other day.   Glucagon Emergency 1 MG Kit Inject 1 mg into the muscle once as needed (low blood sugar).   lisinopril 10 MG tablet Commonly known as: ZESTRIL Take 10 mg by mouth daily.   magnesium oxide 400 (240 Mg) MG tablet Commonly known as: MAG-OX Take 400 mg by mouth daily.   multivitamin with minerals tablet Take 1 tablet by mouth daily.   oxyCODONE 5 MG immediate release tablet Commonly known as: Oxy IR/ROXICODONE Take 1 tablet (5 mg total) by mouth 3 (three) times daily with meals as needed for moderate pain, severe pain or breakthrough pain.   Vitamin D (Ergocalciferol) 1.25 MG (50000 UNIT) Caps capsule Commonly known as: DRISDOL Take 50,000 Units by mouth every 7 (seven) days. thursday       Subjective   Pt reports doing well. She denies pain in her foot. Describes an ache in her right arm from her elbow to shoulder area. She states that she ate a good breakfast. She is not oriented to the situation of her admission.    All questions and concerns were addressed at time of discharge.  Objective  Blood pressure (!) 158/58, pulse 76, temperature 98 F (36.7 C), resp. rate 18, height 5\' 6"  (1.676 m), weight 58 kg, SpO2 98%.   General: Pt is alert, awake, not in acute distress Cardiovascular: RRR, S1/S2 +, no rubs, no gallops Respiratory: CTA bilaterally, no wheezing, no rhonchi Abdominal: Soft, NT, ND, bowel sounds + Extremities: no edema, no cyanosis. No tenderness to palpation of right arm. She has good strength for her overall status and is able to pull herself up in the bed with that arm.  The results of significant diagnostics from this hospitalization (including imaging, microbiology, ancillary and laboratory) are listed below for reference.   Imaging studies: DG Foot 2 Views Left  Result Date: 08/26/2022 CLINICAL DATA:  Left toe necrosis, osteomyelitis, history of fifth toe removal EXAM: LEFT FOOT - 2 VIEW COMPARISON:  CT of the left foot 08/07/2022 FINDINGS: Postoperative change of fifth toe amputation at the TMT joint. Wound VAC about the lateral foot. There is mild haziness about the lateral cortex of the fourth metatarsal. This is indeterminate for postoperative change versus developing  osteomyelitis. Plantar calcaneal spur. Demineralization. Vascular calcifications. IMPRESSION: Mild haziness about the lateral cortex of the fourth metatarsal. This is indeterminate for postoperative change versus developing osteomyelitis. Consider MRI for further evaluation. Electronically Signed   By: Minerva Fester M.D.   On: 08/26/2022 17:07   PERIPHERAL VASCULAR CATHETERIZATION  Result Date: 08/11/2022 See surgical note for result.  CT FOOT LEFT W CONTRAST  Result Date: 08/07/2022 CLINICAL DATA:  Osteomyelitis, rule out abscess. EXAM: CT OF THE LOWER LEFT EXTREMITY WITH CONTRAST TECHNIQUE: Multidetector CT imaging of the lower left extremity was performed according to the standard protocol following intravenous  contrast administration. RADIATION DOSE REDUCTION: This exam was performed according to the departmental dose-optimization program which includes automated exposure control, adjustment of the mA and/or kV according to patient size and/or use of iterative reconstruction technique. CONTRAST:  OMNIPAQUE IOHEXOL 300 MG/ML  SOLN COMPARISON:  None Available. FINDINGS: Bones/Joint/Cartilage Sclerosis of the base of the fifth metatarsal and proximal shaft, adjacent to the deep skin wound concerning for osteomyelitis. There is severe osteopenia.  No acute fracture or dislocation. Ligaments Suboptimally assessed by CT. Muscles and Tendons Flexor and extensor tendons appear intact. Achilles tendon appear intact. Soft tissues Marked skin thickening and subcutaneous soft tissue edema about the ankle and foot. Deep skin wound about the lateral aspect of the base of the fifth metatarsal. No fluid collection or abscess identified. IMPRESSION: 1. Deep skin wound about the lateral aspect of the base of the fifth metatarsal. Sclerosis of the base of the fifth metatarsal and proximal shaft, adjacent to the deep skin wound, concerning for osteomyelitis. 2. Marked skin thickening and subcutaneous soft tissue edema about the ankle and foot, consistent with cellulitis. No fluid collection or abscess identified. 3. Severe osteopenia. Electronically Signed   By: Larose Hires D.O.   On: 08/07/2022 20:58   DG Chest Port 1 View  Result Date: 08/07/2022 CLINICAL DATA:  Preoperative study. EXAM: PORTABLE CHEST 1 VIEW COMPARISON:  Chest x-ray dated January 07, 2022. FINDINGS: The heart size and mediastinal contours are within normal limits. Normal pulmonary vascularity. No focal consolidation, pleural effusion, or pneumothorax. No acute osseous abnormality. IMPRESSION: No active disease. Electronically Signed   By: Obie Dredge M.D.   On: 08/07/2022 20:30   DG Foot 2 Views Left  Result Date: 08/07/2022 CLINICAL DATA:  Open wound  in skin, diabetes EXAM: LEFT FOOT - 2 VIEW COMPARISON:  01/07/2022 FINDINGS: No displaced fracture or dislocation is seen. Osteopenia is seen in bony structures. Possible erosion is seen in the lateral margin of base of fifth metatarsal. There is lucency overlying the base of fifth metatarsal, possibly suggesting open wound in the skin. There is flattening of plantar arch. Plantar spur is seen in calcaneus. There is soft tissue swelling over the dorsum. Arterial calcifications are seen in the soft tissues. IMPRESSION: No recent fracture or dislocation is seen. There is erosive change in the lateral aspect of base of left fifth metatarsal. Possibility of osteomyelitis is not excluded. If clinically warranted, follow-up MRI may be considered. Electronically Signed   By: Ernie Avena M.D.   On: 08/07/2022 17:45    Labs: Basic Metabolic Panel: Recent Labs  Lab 08/21/22 0320 08/26/22 1418 08/26/22 1441 08/27/22 0159  NA 144 139  --  135  K 3.7 3.1*  --  3.6  CL 111 103  --  103  CO2 25 26  --  23  GLUCOSE 94 68*  --  96  BUN 17 16  --  14  CREATININE 0.98 0.75  --  0.77  CALCIUM 8.2* 8.7*  --  7.8*  MG 1.7  --  1.8  --    CBC: Recent Labs  Lab 08/21/22 0320 08/26/22 1418 08/27/22 0159 08/27/22 0702  WBC 8.7 22.4* 14.2*  --   HGB 8.5* 10.7* 8.2* 8.4*  HCT 26.6* 34.2* 25.7* 26.0*  MCV 94.0 95.3 94.1  --   PLT 316 460* 396  --    Microbiology: Results for orders placed or performed during the hospital encounter of 08/26/22  Culture, blood (routine x 2)     Status: None (Preliminary result)   Collection Time: 08/26/22  4:17 PM   Specimen: BLOOD  Result Value Ref Range Status   Specimen Description BLOOD BLOOD RIGHT FOREARM  Final   Special Requests BLOOD Blood Culture adequate volume  Final   Culture   Final    NO GROWTH < 24 HOURS Performed at [facility], [address] [place], [place] [address]    Report Status PENDING  Incomplete  Culture, blood (routine x  2)     Status: None (Preliminary result)   Collection Time: 08/26/22  4:19 PM   Specimen: BLOOD  Result Value Ref Range Status   Specimen Description BLOOD BLOOD RIGHT ARM  Final   Special Requests   Final    BOTTLES DRAWN AEROBIC AND ANAEROBIC Blood Culture results may not be optimal due to an excessive volume of blood received in culture bottles   Culture   Final    NO GROWTH < 24 HOURS Performed at [facility], [address] [place], [place] [address]    Report Status PENDING  Incomplete   Time coordinating discharge: Over 30 minutes  Leeroy Bock, MD  [facility] 08/27/2022, 11:50 AM

## 2022-08-27 NOTE — [place] FL2 (Signed)
[place] MEDICAID FL2 LEVEL OF CARE FORM     IDENTIFICATION  Patient Name: [name] Birthdate: [birth year] Sex: female Admission Date (Current Location): 08/26/2022  [facility] and [place] Number:  Chiropodist and Address:         Provider Number: [id number]  Attending Physician Name and Address:  Leeroy Bock, MD  Relative Name and Phone Number:       Current Level of Care: Hospital Recommended Level of Care: Nursing Facility Prior Approval Number:    Date Approved/Denied:   PASRR Number: [id number]  Discharge Plan: SNF    Current Diagnoses: Patient Active Problem List   Diagnosis Date Noted   Gangrene of left foot (HCC) 08/26/2022   Acute osteomyelitis of left foot (HCC) 08/08/2022   Diabetic foot infection (HCC) 08/07/2022   Hemiplegia affecting left side in left-dominant patient as late effect of cerebrovascular disease (HCC) 01/10/2022   Vaginal candidiasis 01/10/2022   Leg wound, left 01/08/2022   Hypoglycemia 01/07/2022   Leukocytosis 01/07/2022   MRI contraindicated due to metal implant 01/07/2022   Open wound of left foot 01/07/2022   Atherosclerosis of native arteries of the extremities with ulceration (HCC) 06/06/2021   Pressure injury of skin 06/01/2021   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   PAD (peripheral artery disease) (HCC) 05/17/2021   Right hemiparesis (HCC) 05/16/2021   Cellulitis of left foot 05/15/2021   Hypokalemia 05/15/2021   CVA (cerebral vascular accident) (HCC) 11/14/2019   History of stroke 10/07/2019   Altered mental status 10/06/2019   Carotid artery disease (HCC) 09/28/2019   Left hemiparesis (HCC) 09/28/2019   UTI (urinary tract infection) 09/20/2019   Sepsis (HCC) 03/22/2018   Primary osteoarthritis of both hips 11/06/2017   Microalbuminuria 11/02/2017   Cystocele, midline 09/04/2017   Incomplete uterine prolapse 09/04/2017   Type 2 diabetes mellitus with microalbuminuria, without long-term current  use of insulin (HCC) 07/27/2017   Ankle edema 04/27/2017   Pure hypercholesterolemia 03/30/2016   Malnutrition of moderate degree 01/15/2016   Dehydration    Incarcerated ventral hernia    Diabetic ketoacidosis without coma associated with type 2 diabetes mellitus (HCC)    Type 2 diabetes mellitus with polyneuropathy (HCC) 08/06/2015   Carpal tunnel syndrome, bilateral 07/10/2015   Osteopenia of multiple sites 07/10/2015   Arthralgia of multiple sites 06/27/2015   Mixed hyperlipidemia 11/06/2014   Hypomagnesemia 11/02/2013   Essential hypertension 11/02/2013    Orientation RESPIRATION BLADDER Height & Weight     Self  Normal Incontinent Weight: 58 kg Height:  5\' 6"  (167.6 cm)  BEHAVIORAL SYMPTOMS/MOOD NEUROLOGICAL BOWEL NUTRITION STATUS      Incontinent Diet  AMBULATORY STATUS COMMUNICATION OF NEEDS Skin   Total Care Verbally Other (Comment) (Wound vac foot)                       Personal Care Assistance Level of Assistance              Functional Limitations Info             SPECIAL CARE FACTORS FREQUENCY                       Contractures Contractures Info: Not present    Additional Factors Info  Code Status, Allergies Code Status Info: DNR Allergies Info: Metformin and related           Current Medications (08/27/2022):  This is the current hospital active medication list Current  Facility-Administered Medications  Medication Dose Route Frequency Provider Last Rate Last Admin   acetaminophen (TYLENOL) tablet 650 mg  650 mg Oral Q6H PRN Esaw Grandchild A, DO       Or   acetaminophen (TYLENOL) suppository 650 mg  650 mg Rectal Q6H PRN Esaw Grandchild A, DO       ascorbic acid (VITAMIN C) tablet 500 mg  500 mg Oral BID Esaw Grandchild A, DO   500 mg at 08/27/22 1610   aspirin EC tablet 81 mg  81 mg Oral Daily Esaw Grandchild A, DO   81 mg at 08/27/22 0939   atorvastatin (LIPITOR) tablet 80 mg  80 mg Oral QHS Esaw Grandchild A, DO   80 mg at  08/26/22 2106   bisacodyl (DULCOLAX) EC tablet 5 mg  5 mg Oral Daily PRN Esaw Grandchild A, DO       cefTRIAXone (ROCEPHIN) 2 g in sodium chloride 0.9 % 100 mL IVPB  2 g Intravenous Q24H Esaw Grandchild A, DO 200 mL/hr at 08/26/22 2153 2 g at 08/26/22 2153   cyanocobalamin (VITAMIN B12) tablet 1,000 mcg  1,000 mcg Oral Daily Esaw Grandchild A, DO   1,000 mcg at 08/27/22 0939   dextrose 5 % solution   Intravenous Continuous Mansy, Jan A, MD 100 mL/hr at 08/27/22 0428 New Bag at 08/27/22 0428   feeding supplement (PRO-STAT SUGAR FREE 64) liquid 30 mL  30 mL Oral BID Esaw Grandchild A, DO   30 mL at 08/27/22 9604   ferrous sulfate tablet 325 mg  325 mg Oral Herbert Pun A, DO   325 mg at 08/27/22 0939   lisinopril (ZESTRIL) tablet 10 mg  10 mg Oral Daily Esaw Grandchild A, DO   10 mg at 08/27/22 5409   magnesium oxide (MAG-OX) tablet 400 mg  400 mg Oral Daily Esaw Grandchild A, DO   400 mg at 08/27/22 8119   multivitamin with minerals tablet 1 tablet  1 tablet Oral Daily Esaw Grandchild A, DO   1 tablet at 08/27/22 0939   ondansetron (ZOFRAN) tablet 4 mg  4 mg Oral Q6H PRN Esaw Grandchild A, DO       Or   ondansetron (ZOFRAN) injection 4 mg  4 mg Intravenous Q6H PRN Esaw Grandchild A, DO       oxyCODONE (Oxy IR/ROXICODONE) immediate release tablet 5 mg  5 mg Oral TID WC PRN Esaw Grandchild A, DO       polyethylene glycol (MIRALAX / GLYCOLAX) packet 17 g  17 g Oral Daily PRN Esaw Grandchild A, DO       vancomycin (VANCOCIN) IVPB 1000 mg/200 mL premix  1,000 mg Intravenous Q24H Foye Deer, [facility]         Discharge Medications: Please see discharge summary for a list of discharge medications.  Relevant Imaging Results:  Relevant Lab Results:   Additional Information SS #: [id number]  Chapman Fitch, RN

## 2022-08-27 NOTE — Progress Notes (Signed)
Mobility Specialist - Progress Note   08/27/22 1400  Mobility  Activity Transferred from bed to chair;Turned to right side;Turned to left side;Turned to back - supine  Level of Assistance +2 (takes two people)  Assistive Device [employer]  Activity Response Tolerated well  $Mobility charge 1 Mobility  Mobility Specialist Start Time (ACUTE ONLY) 1140  Mobility Specialist Stop Time (ACUTE ONLY) 1203  Mobility Specialist Time Calculation (min) (ACUTE ONLY) 23 min   Pt supine upon entry, utilizing RA. Pt transferred to the recliner via hoyer lift +2 for safety. Pt left seated in the recliner with alarm set and needs within reach. Prevalon boot donned. RN notified.   Zetta Bills Mobility Specialist 08/27/22 2:04 PM

## 2022-08-27 NOTE — Consult Note (Addendum)
WOC Nurse Consult Note: Reason for Consult: Consult requested to reapply Negative pressure wound therapy (NPWT) to left foot.  Pt has a home machine which is charging at the bedside which she was using prior to admission. This was previously removed and Dr Antonietta Barcelona of the podiatry team assessed the wound appearance and requested it be replaced.  Left great toe with dry eschar. Left outer foot with full thickness post-op wound; 4X6X.3cm, beefy red with exposed tendon.  Pt denies c/o pain.  Applied Mepitel contact layer, then one piece black foam to cont suction and attached to facility NPWT machine.  Pt can resume use of her home machine after discharge.  Pt does not have a dressing kit in the bag and her machine is not compatible with our brand of Vac therapy dressing to attach the tubing, so she will need Vac removed upon discharge, and a moist gauze dressing applied and home health will need to reapply NPWT at home.  WOC will change dressing again on Fri if patient is still in the hospital at that time.  Thank-you,  Cammie Mcgee MSN, RN, CWOCN, [place], CNS [phone]

## 2022-08-27 NOTE — Plan of Care (Signed)
Resolve care plan

## 2022-08-27 NOTE — TOC Initial Note (Signed)
Transition of Care [facility]) - Initial/Assessment Note    Patient Details  Name: [name] MRN: [id number] Date of Birth: October 10, 1933  [facility]) CM/SW Contact:    Chapman Fitch, RN Phone Number: 08/27/2022, 1:31 PM  Clinical Narrative:                  [facility] today Kailee with DSS notified.  She states they currently have a protective order    [facility] to[facility] Anticipated DC date: 08/27/22  Family notified: Milinda Pointer with DSS Transport by: Wendie Simmer  Per MD patient ready for DC to . RN, and facility notified of DC. Discharge Summary sent to facility. RN given number for report. DC packet on chart. Ambulance transport requested for patient.  Patient to discharge with wet to dry dressing and facility to replace wound vac.  [name] at [facility] aware TOC signing off.      Patient Goals and CMS Choice            Expected Discharge Plan and Services         Expected Discharge Date: 08/27/22                                    Prior Living Arrangements/Services                       Activities of Daily Living      Permission Sought/Granted                  Emotional Assessment              Admission diagnosis:  Hypoglycemia [E16.2] Ischemic foot [I99.8] Osteomyelitis of left foot, unspecified type (HCC) [M86.9] Sepsis without acute organ dysfunction, due to unspecified organism [facility]) [A41.9] Acute lower limb ischemia [I99.8] Patient Active Problem List   Diagnosis Date Noted   Ischemic foot 08/27/2022   Osteomyelitis of left foot (HCC) 08/27/2022   Gangrene of left foot (HCC) 08/26/2022   Acute osteomyelitis of left foot (HCC) 08/08/2022   Diabetic foot infection (HCC) 08/07/2022   Hemiplegia affecting left side in left-dominant patient as late effect of cerebrovascular disease (HCC) 01/10/2022   Vaginal candidiasis 01/10/2022   Leg wound, left 01/08/2022   Hypoglycemia 01/07/2022    Leukocytosis 01/07/2022   MRI contraindicated due to metal implant 01/07/2022   Open wound of left foot 01/07/2022   Atherosclerosis of native arteries of the extremities with ulceration (HCC) 06/06/2021   Pressure injury of skin 06/01/2021   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   PAD (peripheral artery disease) (HCC) 05/17/2021   Right hemiparesis (HCC) 05/16/2021   Cellulitis of left foot 05/15/2021   Hypokalemia 05/15/2021   CVA (cerebral vascular accident) (HCC) 11/14/2019   History of stroke 10/07/2019   Altered mental status 10/06/2019   Carotid artery disease (HCC) 09/28/2019   Left hemiparesis (HCC) 09/28/2019   UTI (urinary tract infection) 09/20/2019   Sepsis (HCC) 03/22/2018   Primary osteoarthritis of both hips 11/06/2017   Microalbuminuria 11/02/2017   Cystocele, midline 09/04/2017   Incomplete uterine prolapse 09/04/2017   Type 2 diabetes mellitus with microalbuminuria, without long-term current use of insulin (HCC) 07/27/2017   Ankle edema 04/27/2017   Pure hypercholesterolemia 03/30/2016   Malnutrition of moderate degree 01/15/2016   Dehydration    Incarcerated ventral hernia    Diabetic ketoacidosis without coma associated  with type 2 diabetes mellitus (HCC)    Type 2 diabetes mellitus with polyneuropathy (HCC) 08/06/2015   Carpal tunnel syndrome, bilateral 07/10/2015   Osteopenia of multiple sites 07/10/2015   Arthralgia of multiple sites 06/27/2015   Mixed hyperlipidemia 11/06/2014   Hypomagnesemia 11/02/2013   Essential hypertension 11/02/2013   PCP:  Gracelyn Nurse, MD Pharmacy:   [facility] - [place], [place] - [address] [place] [address] Phone: [phone] Fax: [phone]  [id number] [place], [place] - [address] AT [facility] [address] [place] [address] Phone: [phone] Fax: [phone]     Social Determinants of Health (SDOH) Social History: SDOH Screenings    Food Insecurity: No Food Insecurity (08/19/2022)  Housing: Patient Declined (08/19/2022)  Transportation Needs: No Transportation Needs (08/19/2022)  Tobacco Use: Low Risk  (08/13/2022)   SDOH Interventions:     Readmission Risk Interventions     No data to display

## 2022-08-27 NOTE — Inpatient Diabetes Management (Signed)
Inpatient Diabetes Program Recommendations  AACE/ADA: New Consensus Statement on Inpatient Glycemic Control (2015)  Target Ranges:  Prepandial:   less than 140 mg/dL      [facility] postprandial:   less than 180 mg/dL (1-2 hours)      Critically ill patients:  140 - 180 mg/dL   Lab Results  Component Value Date   GLUCAP 148 (H) 08/27/2022   HGBA1C 8.1 (H) 08/08/2022    Diabetes history: DM2 Outpatient Diabetes medications: Amaryl 2 mg QAM Current orders for Inpatient glycemic control: CBGs  Inpatient Diabetes Program Recommendations:    From SNF with hypoglycemia.  Last A1C 8.1%.  MD note states may discontinue Amaryl at DC.  Agree with discontinuing Amaryl at discharge; might consider Metformin if appropriate.    Will continue to follow while inpatient.  Thank you, Dulce Sellar, MSN, CDCES Diabetes Coordinator Inpatient Diabetes Program [phone] (team pager from 8a-5p)

## 2022-08-27 NOTE — Progress Notes (Signed)
ANTICOAGULATION CONSULT NOTE - Initial Consult  Pharmacy Consult for IV heparin Indication:  limb ischemia  Allergies  Allergen Reactions   Metformin And Related Nausea And Vomiting    Patient Measurements: Height: 5\' 6"  (167.6 cm) Weight: 58 kg (127 lb 13.9 oz) IBW/kg (Calculated) : 59.3 Heparin Dosing Weight: 68 kg  Vital Signs: Temp: 98.3 F (36.8 C) (07/17 0149) Temp Source: Oral (07/17 0149) BP: 142/78 (07/17 0149) Pulse Rate: 93 (07/17 0149)  Labs: Recent Labs    08/26/22 1418 08/26/22 1619 08/27/22 0159  HGB 10.7*  --  8.2*  HCT 34.2*  --  25.7*  PLT 460*  --  396  APTT  --  37*  --   LABPROT  --  15.1  --   INR  --  1.2  --   HEPARINUNFRC  --   --  >1.10*  CREATININE 0.75  --  0.77    Estimated Creatinine Clearance: 46.2 mL/min (by C-G formula based on SCr of 0.77 mg/dL).   Medical History: Past Medical History:  Diagnosis Date   Carpal tunnel syndrome on both sides    Diabetes (HCC)    Diabetic polyneuropathy (HCC)    Diverticulitis    Hyperlipidemia    Hypertension    Osteoarthritis    Vaginal prolapse     Medications:  No prior to admission anticoagulation  Assessment: 89 year old female admitted with limb ischemia. PMH includes HLD, HTN, DM, CVA, PAD.  7/17 0159 HL > 1.1   Goal of Therapy:  Heparin level 0.3-0.7 units/ml Monitor platelets by anticoagulation protocol: Yes   Plan:  Heparin level is supratherapeutic. Will hold heparin for 1 hour and decrease heparin infusion to 800 units/hr. Recheck heparin level in 8 hours. CBC daily while on heparin.    Paschal Dopp, PharmD, BCPS Clinical Pharmacist  08/27/2022 3:04 AM

## 2022-08-27 NOTE — Care Management Obs Status (Signed)
MEDICARE OBSERVATION STATUS NOTIFICATION   Patient Details  Name: [name] MRN: [id number] Date of Birth: 08-31-1933   Medicare Observation Status Notification Given:  Yes  Telephone reviewed with [place] DSS.  Copy Secure emailed   Chapman Fitch, RN 08/27/2022, 11:18 AM

## 2022-08-27 NOTE — Progress Notes (Signed)
Multiple events occurred throughout the night shift.  Patient was admitted to floor from ED around 830pm-840pm. Patient has history of diabetes. Along with vitals, glucose was checked upon admission to floor. At 841pm, glucose check showed level of 25. Last glucose check in the ED prior to transport was 77 at 344pm. Did immediate recheck, was 26. Paged on call, Dr. Arville Care, gave amp of D50 and held all glycemic medications. Glucose recheck showed level went up to 170. Patient is baseline confused, but was alert and communicating with staff, patient was asymptomatic throughout entire event. Vitals WNL. Did q2hr glucose checks, at midnight and 2am, glucose was in the 80's, however between each check we were giving her juices to drink to kept her sugar up. At 4am, glucose was at 47. Paged Dr. Arville Care, ordered another amp of D50 and to start D5W @100mL /h infusion. Recheck showed glucose level went up to 165.

## 2022-08-28 DIAGNOSIS — M86272 Subacute osteomyelitis, left ankle and foot: Secondary | ICD-10-CM | POA: Diagnosis not present

## 2022-08-28 DIAGNOSIS — E118 Type 2 diabetes mellitus with unspecified complications: Secondary | ICD-10-CM | POA: Diagnosis not present

## 2022-08-28 DIAGNOSIS — I10 Essential (primary) hypertension: Secondary | ICD-10-CM | POA: Diagnosis not present

## 2022-08-28 DIAGNOSIS — I739 Peripheral vascular disease, unspecified: Secondary | ICD-10-CM | POA: Diagnosis not present

## 2022-08-29 LAB — CULTURE, BLOOD (ROUTINE X 2)
Culture: NO GROWTH
Special Requests: ADEQUATE

## 2022-08-30 LAB — CULTURE, BLOOD (ROUTINE X 2)

## 2022-08-31 LAB — CULTURE, BLOOD (ROUTINE X 2): Culture: NO GROWTH

## 2022-09-01 DIAGNOSIS — I10 Essential (primary) hypertension: Secondary | ICD-10-CM | POA: Diagnosis not present

## 2022-09-01 DIAGNOSIS — E118 Type 2 diabetes mellitus with unspecified complications: Secondary | ICD-10-CM | POA: Diagnosis not present

## 2022-09-01 DIAGNOSIS — M86272 Subacute osteomyelitis, left ankle and foot: Secondary | ICD-10-CM | POA: Diagnosis not present

## 2022-09-03 DIAGNOSIS — B351 Tinea unguium: Secondary | ICD-10-CM | POA: Diagnosis not present

## 2022-09-03 DIAGNOSIS — E1142 Type 2 diabetes mellitus with diabetic polyneuropathy: Secondary | ICD-10-CM | POA: Diagnosis not present

## 2022-09-04 DIAGNOSIS — E118 Type 2 diabetes mellitus with unspecified complications: Secondary | ICD-10-CM | POA: Diagnosis not present

## 2022-09-04 DIAGNOSIS — I10 Essential (primary) hypertension: Secondary | ICD-10-CM | POA: Diagnosis not present

## 2022-09-04 DIAGNOSIS — I739 Peripheral vascular disease, unspecified: Secondary | ICD-10-CM | POA: Diagnosis not present

## 2022-09-04 DIAGNOSIS — M86272 Subacute osteomyelitis, left ankle and foot: Secondary | ICD-10-CM | POA: Diagnosis not present

## 2022-09-04 DIAGNOSIS — M6259 Muscle wasting and atrophy, not elsewhere classified, multiple sites: Secondary | ICD-10-CM | POA: Diagnosis not present

## 2022-09-04 DIAGNOSIS — M24542 Contracture, left hand: Secondary | ICD-10-CM | POA: Diagnosis not present

## 2022-09-04 DIAGNOSIS — R293 Abnormal posture: Secondary | ICD-10-CM | POA: Diagnosis not present

## 2022-09-04 DIAGNOSIS — R2681 Unsteadiness on feet: Secondary | ICD-10-CM | POA: Diagnosis not present

## 2022-09-04 DIAGNOSIS — I69354 Hemiplegia and hemiparesis following cerebral infarction affecting left non-dominant side: Secondary | ICD-10-CM | POA: Diagnosis not present

## 2022-09-04 DIAGNOSIS — M24532 Contracture, left wrist: Secondary | ICD-10-CM | POA: Diagnosis not present

## 2022-09-04 DIAGNOSIS — M24522 Contracture, left elbow: Secondary | ICD-10-CM | POA: Diagnosis not present

## 2022-09-04 DIAGNOSIS — Z741 Need for assistance with personal care: Secondary | ICD-10-CM | POA: Diagnosis not present

## 2022-09-05 DIAGNOSIS — I69354 Hemiplegia and hemiparesis following cerebral infarction affecting left non-dominant side: Secondary | ICD-10-CM | POA: Diagnosis not present

## 2022-09-05 DIAGNOSIS — M24522 Contracture, left elbow: Secondary | ICD-10-CM | POA: Diagnosis not present

## 2022-09-05 DIAGNOSIS — M6259 Muscle wasting and atrophy, not elsewhere classified, multiple sites: Secondary | ICD-10-CM | POA: Diagnosis not present

## 2022-09-05 DIAGNOSIS — R2681 Unsteadiness on feet: Secondary | ICD-10-CM | POA: Diagnosis not present

## 2022-09-05 DIAGNOSIS — M24532 Contracture, left wrist: Secondary | ICD-10-CM | POA: Diagnosis not present

## 2022-09-05 DIAGNOSIS — Z741 Need for assistance with personal care: Secondary | ICD-10-CM | POA: Diagnosis not present

## 2022-09-05 DIAGNOSIS — M24542 Contracture, left hand: Secondary | ICD-10-CM | POA: Diagnosis not present

## 2022-09-05 DIAGNOSIS — I739 Peripheral vascular disease, unspecified: Secondary | ICD-10-CM | POA: Diagnosis not present

## 2022-09-05 DIAGNOSIS — R293 Abnormal posture: Secondary | ICD-10-CM | POA: Diagnosis not present

## 2022-09-08 DIAGNOSIS — I739 Peripheral vascular disease, unspecified: Secondary | ICD-10-CM | POA: Diagnosis not present

## 2022-09-08 DIAGNOSIS — I69354 Hemiplegia and hemiparesis following cerebral infarction affecting left non-dominant side: Secondary | ICD-10-CM | POA: Diagnosis not present

## 2022-09-08 DIAGNOSIS — Z741 Need for assistance with personal care: Secondary | ICD-10-CM | POA: Diagnosis not present

## 2022-09-08 DIAGNOSIS — R293 Abnormal posture: Secondary | ICD-10-CM | POA: Diagnosis not present

## 2022-09-08 DIAGNOSIS — R2681 Unsteadiness on feet: Secondary | ICD-10-CM | POA: Diagnosis not present

## 2022-09-08 DIAGNOSIS — M24532 Contracture, left wrist: Secondary | ICD-10-CM | POA: Diagnosis not present

## 2022-09-08 DIAGNOSIS — M24542 Contracture, left hand: Secondary | ICD-10-CM | POA: Diagnosis not present

## 2022-09-08 DIAGNOSIS — M6259 Muscle wasting and atrophy, not elsewhere classified, multiple sites: Secondary | ICD-10-CM | POA: Diagnosis not present

## 2022-09-08 DIAGNOSIS — M24522 Contracture, left elbow: Secondary | ICD-10-CM | POA: Diagnosis not present

## 2022-09-09 DIAGNOSIS — Z741 Need for assistance with personal care: Secondary | ICD-10-CM | POA: Diagnosis not present

## 2022-09-09 DIAGNOSIS — M86272 Subacute osteomyelitis, left ankle and foot: Secondary | ICD-10-CM | POA: Diagnosis not present

## 2022-09-09 DIAGNOSIS — I739 Peripheral vascular disease, unspecified: Secondary | ICD-10-CM | POA: Diagnosis not present

## 2022-09-09 DIAGNOSIS — M24542 Contracture, left hand: Secondary | ICD-10-CM | POA: Diagnosis not present

## 2022-09-09 DIAGNOSIS — M24522 Contracture, left elbow: Secondary | ICD-10-CM | POA: Diagnosis not present

## 2022-09-09 DIAGNOSIS — M24532 Contracture, left wrist: Secondary | ICD-10-CM | POA: Diagnosis not present

## 2022-09-09 DIAGNOSIS — I69354 Hemiplegia and hemiparesis following cerebral infarction affecting left non-dominant side: Secondary | ICD-10-CM | POA: Diagnosis not present

## 2022-09-09 DIAGNOSIS — R2681 Unsteadiness on feet: Secondary | ICD-10-CM | POA: Diagnosis not present

## 2022-09-09 DIAGNOSIS — I10 Essential (primary) hypertension: Secondary | ICD-10-CM | POA: Diagnosis not present

## 2022-09-09 DIAGNOSIS — M6259 Muscle wasting and atrophy, not elsewhere classified, multiple sites: Secondary | ICD-10-CM | POA: Diagnosis not present

## 2022-09-09 DIAGNOSIS — E118 Type 2 diabetes mellitus with unspecified complications: Secondary | ICD-10-CM | POA: Diagnosis not present

## 2022-09-09 DIAGNOSIS — R293 Abnormal posture: Secondary | ICD-10-CM | POA: Diagnosis not present

## 2022-09-10 DIAGNOSIS — I69354 Hemiplegia and hemiparesis following cerebral infarction affecting left non-dominant side: Secondary | ICD-10-CM | POA: Diagnosis not present

## 2022-09-10 DIAGNOSIS — M6259 Muscle wasting and atrophy, not elsewhere classified, multiple sites: Secondary | ICD-10-CM | POA: Diagnosis not present

## 2022-09-10 DIAGNOSIS — M24522 Contracture, left elbow: Secondary | ICD-10-CM | POA: Diagnosis not present

## 2022-09-10 DIAGNOSIS — R293 Abnormal posture: Secondary | ICD-10-CM | POA: Diagnosis not present

## 2022-09-10 DIAGNOSIS — M24532 Contracture, left wrist: Secondary | ICD-10-CM | POA: Diagnosis not present

## 2022-09-10 DIAGNOSIS — R2681 Unsteadiness on feet: Secondary | ICD-10-CM | POA: Diagnosis not present

## 2022-09-10 DIAGNOSIS — M24542 Contracture, left hand: Secondary | ICD-10-CM | POA: Diagnosis not present

## 2022-09-10 DIAGNOSIS — Z741 Need for assistance with personal care: Secondary | ICD-10-CM | POA: Diagnosis not present

## 2022-09-10 DIAGNOSIS — I739 Peripheral vascular disease, unspecified: Secondary | ICD-10-CM | POA: Diagnosis not present

## 2022-09-11 DIAGNOSIS — Z741 Need for assistance with personal care: Secondary | ICD-10-CM | POA: Diagnosis not present

## 2022-09-11 DIAGNOSIS — I739 Peripheral vascular disease, unspecified: Secondary | ICD-10-CM | POA: Diagnosis not present

## 2022-09-11 DIAGNOSIS — M24542 Contracture, left hand: Secondary | ICD-10-CM | POA: Diagnosis not present

## 2022-09-11 DIAGNOSIS — R278 Other lack of coordination: Secondary | ICD-10-CM | POA: Diagnosis not present

## 2022-09-11 DIAGNOSIS — M24522 Contracture, left elbow: Secondary | ICD-10-CM | POA: Diagnosis not present

## 2022-09-11 DIAGNOSIS — I69354 Hemiplegia and hemiparesis following cerebral infarction affecting left non-dominant side: Secondary | ICD-10-CM | POA: Diagnosis not present

## 2022-09-11 DIAGNOSIS — M6259 Muscle wasting and atrophy, not elsewhere classified, multiple sites: Secondary | ICD-10-CM | POA: Diagnosis not present

## 2022-09-11 DIAGNOSIS — R2681 Unsteadiness on feet: Secondary | ICD-10-CM | POA: Diagnosis not present

## 2022-09-11 DIAGNOSIS — S91302A Unspecified open wound, left foot, initial encounter: Secondary | ICD-10-CM | POA: Diagnosis not present

## 2022-09-11 DIAGNOSIS — M24532 Contracture, left wrist: Secondary | ICD-10-CM | POA: Diagnosis not present

## 2022-09-12 DIAGNOSIS — M24532 Contracture, left wrist: Secondary | ICD-10-CM | POA: Diagnosis not present

## 2022-09-12 DIAGNOSIS — R2681 Unsteadiness on feet: Secondary | ICD-10-CM | POA: Diagnosis not present

## 2022-09-12 DIAGNOSIS — I69354 Hemiplegia and hemiparesis following cerebral infarction affecting left non-dominant side: Secondary | ICD-10-CM | POA: Diagnosis not present

## 2022-09-12 DIAGNOSIS — R278 Other lack of coordination: Secondary | ICD-10-CM | POA: Diagnosis not present

## 2022-09-12 DIAGNOSIS — I739 Peripheral vascular disease, unspecified: Secondary | ICD-10-CM | POA: Diagnosis not present

## 2022-09-12 DIAGNOSIS — Z741 Need for assistance with personal care: Secondary | ICD-10-CM | POA: Diagnosis not present

## 2022-09-12 DIAGNOSIS — M24542 Contracture, left hand: Secondary | ICD-10-CM | POA: Diagnosis not present

## 2022-09-12 DIAGNOSIS — M6259 Muscle wasting and atrophy, not elsewhere classified, multiple sites: Secondary | ICD-10-CM | POA: Diagnosis not present

## 2022-09-12 DIAGNOSIS — M24522 Contracture, left elbow: Secondary | ICD-10-CM | POA: Diagnosis not present

## 2022-09-15 DIAGNOSIS — M6259 Muscle wasting and atrophy, not elsewhere classified, multiple sites: Secondary | ICD-10-CM | POA: Diagnosis not present

## 2022-09-15 DIAGNOSIS — I69354 Hemiplegia and hemiparesis following cerebral infarction affecting left non-dominant side: Secondary | ICD-10-CM | POA: Diagnosis not present

## 2022-09-15 DIAGNOSIS — M24522 Contracture, left elbow: Secondary | ICD-10-CM | POA: Diagnosis not present

## 2022-09-15 DIAGNOSIS — M24542 Contracture, left hand: Secondary | ICD-10-CM | POA: Diagnosis not present

## 2022-09-15 DIAGNOSIS — I739 Peripheral vascular disease, unspecified: Secondary | ICD-10-CM | POA: Diagnosis not present

## 2022-09-15 DIAGNOSIS — R2681 Unsteadiness on feet: Secondary | ICD-10-CM | POA: Diagnosis not present

## 2022-09-15 DIAGNOSIS — R278 Other lack of coordination: Secondary | ICD-10-CM | POA: Diagnosis not present

## 2022-09-15 DIAGNOSIS — M24532 Contracture, left wrist: Secondary | ICD-10-CM | POA: Diagnosis not present

## 2022-09-15 DIAGNOSIS — Z741 Need for assistance with personal care: Secondary | ICD-10-CM | POA: Diagnosis not present

## 2022-09-16 DIAGNOSIS — I96 Gangrene, not elsewhere classified: Secondary | ICD-10-CM | POA: Diagnosis not present

## 2022-09-16 DIAGNOSIS — I10 Essential (primary) hypertension: Secondary | ICD-10-CM | POA: Diagnosis not present

## 2022-09-16 DIAGNOSIS — R2681 Unsteadiness on feet: Secondary | ICD-10-CM | POA: Diagnosis not present

## 2022-09-16 DIAGNOSIS — I739 Peripheral vascular disease, unspecified: Secondary | ICD-10-CM | POA: Diagnosis not present

## 2022-09-16 DIAGNOSIS — M86272 Subacute osteomyelitis, left ankle and foot: Secondary | ICD-10-CM | POA: Diagnosis not present

## 2022-09-16 DIAGNOSIS — Z741 Need for assistance with personal care: Secondary | ICD-10-CM | POA: Diagnosis not present

## 2022-09-16 DIAGNOSIS — R278 Other lack of coordination: Secondary | ICD-10-CM | POA: Diagnosis not present

## 2022-09-16 DIAGNOSIS — M24532 Contracture, left wrist: Secondary | ICD-10-CM | POA: Diagnosis not present

## 2022-09-16 DIAGNOSIS — L97522 Non-pressure chronic ulcer of other part of left foot with fat layer exposed: Secondary | ICD-10-CM | POA: Diagnosis not present

## 2022-09-16 DIAGNOSIS — M24542 Contracture, left hand: Secondary | ICD-10-CM | POA: Diagnosis not present

## 2022-09-16 DIAGNOSIS — M24522 Contracture, left elbow: Secondary | ICD-10-CM | POA: Diagnosis not present

## 2022-09-16 DIAGNOSIS — E1142 Type 2 diabetes mellitus with diabetic polyneuropathy: Secondary | ICD-10-CM | POA: Diagnosis not present

## 2022-09-16 DIAGNOSIS — I69354 Hemiplegia and hemiparesis following cerebral infarction affecting left non-dominant side: Secondary | ICD-10-CM | POA: Diagnosis not present

## 2022-09-16 DIAGNOSIS — M6259 Muscle wasting and atrophy, not elsewhere classified, multiple sites: Secondary | ICD-10-CM | POA: Diagnosis not present

## 2022-09-17 DIAGNOSIS — M24522 Contracture, left elbow: Secondary | ICD-10-CM | POA: Diagnosis not present

## 2022-09-17 DIAGNOSIS — I739 Peripheral vascular disease, unspecified: Secondary | ICD-10-CM | POA: Diagnosis not present

## 2022-09-17 DIAGNOSIS — R2681 Unsteadiness on feet: Secondary | ICD-10-CM | POA: Diagnosis not present

## 2022-09-17 DIAGNOSIS — M24542 Contracture, left hand: Secondary | ICD-10-CM | POA: Diagnosis not present

## 2022-09-17 DIAGNOSIS — M6259 Muscle wasting and atrophy, not elsewhere classified, multiple sites: Secondary | ICD-10-CM | POA: Diagnosis not present

## 2022-09-17 DIAGNOSIS — I69354 Hemiplegia and hemiparesis following cerebral infarction affecting left non-dominant side: Secondary | ICD-10-CM | POA: Diagnosis not present

## 2022-09-17 DIAGNOSIS — M24532 Contracture, left wrist: Secondary | ICD-10-CM | POA: Diagnosis not present

## 2022-09-17 DIAGNOSIS — Z741 Need for assistance with personal care: Secondary | ICD-10-CM | POA: Diagnosis not present

## 2022-09-17 DIAGNOSIS — R278 Other lack of coordination: Secondary | ICD-10-CM | POA: Diagnosis not present

## 2022-09-19 DIAGNOSIS — Z741 Need for assistance with personal care: Secondary | ICD-10-CM | POA: Diagnosis not present

## 2022-09-19 DIAGNOSIS — M24532 Contracture, left wrist: Secondary | ICD-10-CM | POA: Diagnosis not present

## 2022-09-19 DIAGNOSIS — I69354 Hemiplegia and hemiparesis following cerebral infarction affecting left non-dominant side: Secondary | ICD-10-CM | POA: Diagnosis not present

## 2022-09-19 DIAGNOSIS — R278 Other lack of coordination: Secondary | ICD-10-CM | POA: Diagnosis not present

## 2022-09-19 DIAGNOSIS — M6259 Muscle wasting and atrophy, not elsewhere classified, multiple sites: Secondary | ICD-10-CM | POA: Diagnosis not present

## 2022-09-19 DIAGNOSIS — M24522 Contracture, left elbow: Secondary | ICD-10-CM | POA: Diagnosis not present

## 2022-09-19 DIAGNOSIS — R2681 Unsteadiness on feet: Secondary | ICD-10-CM | POA: Diagnosis not present

## 2022-09-19 DIAGNOSIS — M24542 Contracture, left hand: Secondary | ICD-10-CM | POA: Diagnosis not present

## 2022-09-19 DIAGNOSIS — I739 Peripheral vascular disease, unspecified: Secondary | ICD-10-CM | POA: Diagnosis not present

## 2022-09-22 ENCOUNTER — Ambulatory Visit (INDEPENDENT_AMBULATORY_CARE_PROVIDER_SITE_OTHER): Payer: Medicare HMO | Admitting: Nurse Practitioner

## 2022-09-22 DIAGNOSIS — E118 Type 2 diabetes mellitus with unspecified complications: Secondary | ICD-10-CM | POA: Diagnosis not present

## 2022-09-22 DIAGNOSIS — M86272 Subacute osteomyelitis, left ankle and foot: Secondary | ICD-10-CM | POA: Diagnosis not present

## 2022-09-22 DIAGNOSIS — I10 Essential (primary) hypertension: Secondary | ICD-10-CM | POA: Diagnosis not present

## 2022-09-22 DIAGNOSIS — I739 Peripheral vascular disease, unspecified: Secondary | ICD-10-CM | POA: Diagnosis not present

## 2022-09-23 DIAGNOSIS — I739 Peripheral vascular disease, unspecified: Secondary | ICD-10-CM | POA: Diagnosis not present

## 2022-09-23 DIAGNOSIS — K5909 Other constipation: Secondary | ICD-10-CM | POA: Diagnosis not present

## 2022-09-24 DIAGNOSIS — I739 Peripheral vascular disease, unspecified: Secondary | ICD-10-CM | POA: Diagnosis not present

## 2022-09-24 DIAGNOSIS — I69354 Hemiplegia and hemiparesis following cerebral infarction affecting left non-dominant side: Secondary | ICD-10-CM | POA: Diagnosis not present

## 2022-09-24 DIAGNOSIS — M6259 Muscle wasting and atrophy, not elsewhere classified, multiple sites: Secondary | ICD-10-CM | POA: Diagnosis not present

## 2022-09-24 DIAGNOSIS — M24542 Contracture, left hand: Secondary | ICD-10-CM | POA: Diagnosis not present

## 2022-09-24 DIAGNOSIS — M24532 Contracture, left wrist: Secondary | ICD-10-CM | POA: Diagnosis not present

## 2022-09-24 DIAGNOSIS — M24522 Contracture, left elbow: Secondary | ICD-10-CM | POA: Diagnosis not present

## 2022-09-24 DIAGNOSIS — R278 Other lack of coordination: Secondary | ICD-10-CM | POA: Diagnosis not present

## 2022-09-24 DIAGNOSIS — Z741 Need for assistance with personal care: Secondary | ICD-10-CM | POA: Diagnosis not present

## 2022-09-24 DIAGNOSIS — R2681 Unsteadiness on feet: Secondary | ICD-10-CM | POA: Diagnosis not present

## 2022-09-25 DIAGNOSIS — R2681 Unsteadiness on feet: Secondary | ICD-10-CM | POA: Diagnosis not present

## 2022-09-25 DIAGNOSIS — M24542 Contracture, left hand: Secondary | ICD-10-CM | POA: Diagnosis not present

## 2022-09-25 DIAGNOSIS — R278 Other lack of coordination: Secondary | ICD-10-CM | POA: Diagnosis not present

## 2022-09-25 DIAGNOSIS — M24522 Contracture, left elbow: Secondary | ICD-10-CM | POA: Diagnosis not present

## 2022-09-25 DIAGNOSIS — Z79899 Other long term (current) drug therapy: Secondary | ICD-10-CM | POA: Diagnosis not present

## 2022-09-25 DIAGNOSIS — I69354 Hemiplegia and hemiparesis following cerebral infarction affecting left non-dominant side: Secondary | ICD-10-CM | POA: Diagnosis not present

## 2022-09-25 DIAGNOSIS — I739 Peripheral vascular disease, unspecified: Secondary | ICD-10-CM | POA: Diagnosis not present

## 2022-09-25 DIAGNOSIS — M6259 Muscle wasting and atrophy, not elsewhere classified, multiple sites: Secondary | ICD-10-CM | POA: Diagnosis not present

## 2022-09-25 DIAGNOSIS — Z7902 Long term (current) use of antithrombotics/antiplatelets: Secondary | ICD-10-CM | POA: Diagnosis not present

## 2022-09-25 DIAGNOSIS — Z741 Need for assistance with personal care: Secondary | ICD-10-CM | POA: Diagnosis not present

## 2022-09-25 DIAGNOSIS — M24532 Contracture, left wrist: Secondary | ICD-10-CM | POA: Diagnosis not present

## 2022-09-26 ENCOUNTER — Other Ambulatory Visit (INDEPENDENT_AMBULATORY_CARE_PROVIDER_SITE_OTHER): Payer: Medicare HMO

## 2022-09-26 ENCOUNTER — Ambulatory Visit (INDEPENDENT_AMBULATORY_CARE_PROVIDER_SITE_OTHER): Payer: Medicare HMO | Admitting: Nurse Practitioner

## 2022-09-26 ENCOUNTER — Other Ambulatory Visit (INDEPENDENT_AMBULATORY_CARE_PROVIDER_SITE_OTHER): Payer: Self-pay | Admitting: Nurse Practitioner

## 2022-09-26 ENCOUNTER — Encounter (INDEPENDENT_AMBULATORY_CARE_PROVIDER_SITE_OTHER): Payer: Self-pay | Admitting: Nurse Practitioner

## 2022-09-26 VITALS — BP 166/8 | HR 69 | Resp 18 | Ht 64.0 in | Wt 127.0 lb

## 2022-09-26 DIAGNOSIS — I739 Peripheral vascular disease, unspecified: Secondary | ICD-10-CM

## 2022-09-26 DIAGNOSIS — E1129 Type 2 diabetes mellitus with other diabetic kidney complication: Secondary | ICD-10-CM

## 2022-09-26 DIAGNOSIS — I10 Essential (primary) hypertension: Secondary | ICD-10-CM | POA: Diagnosis not present

## 2022-09-26 DIAGNOSIS — Z9889 Other specified postprocedural states: Secondary | ICD-10-CM | POA: Diagnosis not present

## 2022-09-26 DIAGNOSIS — R809 Proteinuria, unspecified: Secondary | ICD-10-CM | POA: Diagnosis not present

## 2022-09-29 ENCOUNTER — Encounter (INDEPENDENT_AMBULATORY_CARE_PROVIDER_SITE_OTHER): Payer: Self-pay | Admitting: Nurse Practitioner

## 2022-09-29 DIAGNOSIS — M24522 Contracture, left elbow: Secondary | ICD-10-CM | POA: Diagnosis not present

## 2022-09-29 DIAGNOSIS — I69354 Hemiplegia and hemiparesis following cerebral infarction affecting left non-dominant side: Secondary | ICD-10-CM | POA: Diagnosis not present

## 2022-09-29 DIAGNOSIS — I739 Peripheral vascular disease, unspecified: Secondary | ICD-10-CM | POA: Diagnosis not present

## 2022-09-29 DIAGNOSIS — M24532 Contracture, left wrist: Secondary | ICD-10-CM | POA: Diagnosis not present

## 2022-09-29 DIAGNOSIS — M6259 Muscle wasting and atrophy, not elsewhere classified, multiple sites: Secondary | ICD-10-CM | POA: Diagnosis not present

## 2022-09-29 DIAGNOSIS — R278 Other lack of coordination: Secondary | ICD-10-CM | POA: Diagnosis not present

## 2022-09-29 DIAGNOSIS — R2681 Unsteadiness on feet: Secondary | ICD-10-CM | POA: Diagnosis not present

## 2022-09-29 DIAGNOSIS — M24542 Contracture, left hand: Secondary | ICD-10-CM | POA: Diagnosis not present

## 2022-09-29 DIAGNOSIS — Z741 Need for assistance with personal care: Secondary | ICD-10-CM | POA: Diagnosis not present

## 2022-09-29 NOTE — Progress Notes (Signed)
Subjective:    Patient ID: [name], female    DOB: 11-30-1933, 89 y.o.   MRN: [id number] Chief Complaint  Patient presents with   Follow-up    1 month f/u see jd/fb/ also look at her foot (toe she is supposed to make an appt in sept for [facility] care to eval )    The patient returns to the office for followup and review status post angiogram with intervention on 08/11/2022.   Procedure: Procedure(s) Performed:             1.  Introduction catheter into left lower extremity 3rd order catheter placement               2.    Contrast injection left lower extremity for distal runoff             3.  Percutaneous transluminal angioplasty left superficial femoral and popliteal arteries to 6 mm             4.  Percutaneous transluminal angioplasty left tibioperoneal trunk and peroneal to 3 mm             5.  Star close closure right common femoral arteriotomy   The patient continues to have a wound on the lateral portion of the left leg from fifth ray amputation.  The patient has a wound vacuum attached.  This is being managed.  Podiatry.  Patient did not bring supplies today so it was not removed.  She also has dry gangrene of her left great toe does not note any significant pain.  No evidence of drainage from the toe as well.  There have been no significant changes to the patient's overall health care.  No documented history of amaurosis fugax or recent TIA symptoms. There are no recent neurological changes noted. No documented history of DVT, PE or superficial thrombophlebitis. The patient denies recent episodes of angina or shortness of breath.   Arterial duplex of the left lower extremity shows biphasic waveforms until the level of the tibial vessels which shows monophasic vessels.  The anterior tibial and posterior tibial are known to be occluded where shows to be some occlusion of the distal portion of the peroneal but function of the foot is noted by collaterals    Review of  Systems  Skin:  Positive for wound.  Neurological:  Positive for weakness.  All other systems reviewed and are negative.      Objective:   Physical Exam Vitals reviewed.  HENT:     Head: Normocephalic.  Cardiovascular:     Rate and Rhythm: Normal rate.  Pulmonary:     Effort: Pulmonary effort is normal.  Musculoskeletal:       Feet:  Skin:    General: Skin is warm and dry.  Neurological:     Mental Status: She is alert and oriented to person, place, and time.  Psychiatric:        Mood and Affect: Mood normal.        Behavior: Behavior normal.        Thought Content: Thought content normal.        Judgment: Judgment normal.     BP (!) 166/8 (BP Location: Right Arm)   Pulse 69   Resp 18   Ht 5\' 4"  (1.626 m)   Wt 127 lb (57.6 kg)   BMI 21.80 kg/m   Past Medical History:  Diagnosis Date   Carpal tunnel syndrome on both sides  Diabetes (HCC)    Diabetic polyneuropathy (HCC)    Diverticulitis    Hyperlipidemia    Hypertension    Osteoarthritis    Vaginal prolapse     Social History   Socioeconomic History   Marital status: Widowed    Spouse name: Not on file   Number of children: Not on file   Years of education: Not on file   Highest education level: Not on file  Occupational History   Not on file  Tobacco Use   Smoking status: Never   Smokeless tobacco: Never  Vaping Use   Vaping status: Never Used  Substance and Sexual Activity   Alcohol use: No   Drug use: No   Sexual activity: Not Currently  Other Topics Concern   Not on file  Social History Narrative   Lives at home by herself. Ambulates with a cane.   Social Determinants of Health   Financial Resource Strain: Not on file  Food Insecurity: No Food Insecurity (08/19/2022)   Hunger Vital Sign    Worried About Running Out of Food in the Last Year: Never true    Ran Out of Food in the Last Year: Never true  Transportation Needs: No Transportation Needs (08/19/2022)   PRAPARE -  Administrator, Civil Service (Medical): No    Lack of Transportation (Non-Medical): No  Physical Activity: Not on file  Stress: Not on file  Social Connections: Not on file  Intimate Partner Violence: Not At Risk (08/19/2022)   Humiliation, Afraid, Rape, and Kick questionnaire    Fear of Current or Ex-Partner: No    Emotionally Abused: No    Physically Abused: No    Sexually Abused: No    Past Surgical History:  Procedure Laterality Date   AMPUTATION TOE Left 08/13/2022   Procedure: EXCISION OF 5TH RAY;  Surgeon: Gwyneth Revels, DPM;  Location: [facility];  Service: Orthopedics/Podiatry;  Laterality: Left;   CESAREAN SECTION     IRRIGATION AND DEBRIDEMENT FOOT Left 01/09/2022   Procedure: IRRIGATION AND DEBRIDEMENT FOOT WITH BONE BIOPSY;  Surgeon: Gwyneth Revels, DPM;  Location: [facility];  Service: Podiatry;  Laterality: Left;   LOWER EXTREMITY ANGIOGRAPHY Left 06/11/2021   Procedure: Lower Extremity Angiography;  Surgeon: Renford Dills, MD;  Location: [facility];  Service: Cardiovascular;  Laterality: Left;   LOWER EXTREMITY ANGIOGRAPHY Left 08/11/2022   Procedure: Lower Extremity Angiography;  Surgeon: Renford Dills, MD;  Location: [facility];  Service: Cardiovascular;  Laterality: Left;   pessary     REPLACEMENT TOTAL KNEE BILATERAL     VENTRAL HERNIA REPAIR     VENTRAL HERNIA REPAIR N/A 01/13/2016   Procedure: HERNIA REPAIR VENTRAL ADULT WITH SMALL BOWEL RESECTION, LYSIS OF ADHESIONS;  Surgeon: Gladis Riffle, MD;  Location: [facility];  Service: General;  Laterality: N/A;    Family History  Problem Relation Age of Onset   Breast cancer Mother 37   Breast cancer Maternal Aunt     Allergies  Allergen Reactions   Metformin And Related Nausea And Vomiting       Latest Ref Rng & Units 08/27/2022    7:02 AM 08/27/2022    1:59 AM 08/26/2022    2:18 PM  CBC  WBC 4.0 - 10.5 K/uL  14.2  22.4   Hemoglobin 12.0 - 15.0 g/dL 8.4  8.2  29.5    Hematocrit 36.0 - 46.0 % 26.0  25.7  34.2   Platelets 150 -  400 K/uL  396  460       CMP     Component Value Date/Time   NA 135 08/27/2022 0159   NA 143 09/24/2013 0409   K 3.6 08/27/2022 0159   K 3.6 09/24/2013 0409   CL 103 08/27/2022 0159   CL 108 (H) 09/24/2013 0409   CO2 23 08/27/2022 0159   CO2 27 09/24/2013 0409   GLUCOSE 96 08/27/2022 0159   GLUCOSE 222 (H) 09/24/2013 0409   BUN 14 08/27/2022 0159   BUN 7 09/24/2013 0409   CREATININE 0.77 08/27/2022 0159   CREATININE 0.77 09/24/2013 0409   CALCIUM 7.8 (L) 08/27/2022 0159   CALCIUM 7.7 (L) 09/24/2013 0409   PROT 7.2 08/26/2022 1418   PROT 5.4 (L) 09/22/2013 0507   ALBUMIN 3.1 (L) 08/26/2022 1418   ALBUMIN 2.3 (L) 09/22/2013 0507   AST 26 08/26/2022 1418   AST 19 09/22/2013 0507   ALT 13 08/26/2022 1418   ALT 13 (L) 09/22/2013 0507   ALKPHOS 76 08/26/2022 1418   ALKPHOS 75 09/22/2013 0507   BILITOT 0.6 08/26/2022 1418   BILITOT 0.3 09/22/2013 0507   GFRNONAA >60 08/27/2022 0159   GFRNONAA >60 09/24/2013 0409     No results found.     Assessment & Plan:   1. PAD (peripheral artery disease) (HCC) The patient has notable occlusion of the posterior tibial and anterior tibial arteries as noted on angiogram in early July.  Her only runoff is her peroneal currently and based on noninvasive studies it is largely perfusion in the foot through collaterals.  Left foot is warm.  This time I suspect that we may be revascularized as well as could be expected at this point in time.  She will continue to follow with podiatry.  I agree with the recommendation of no further amputation of the great toe as it is currently a stable driver encouraging.  Will have the patient follow-up in the next several weeks in order to reevaluate studies and progression with wound healing.  2. Type 2 diabetes mellitus with microalbuminuria, without long-term current use of insulin (HCC) Continue hypoglycemic medications as already ordered,  these medications have been reviewed and there are no changes at this time.  Hgb A1C to be monitored as already arranged by primary service  3. Essential hypertension Continue antihypertensive medications as already ordered, these medications have been reviewed and there are no changes at this time.   Current Outpatient Medications on File Prior to Visit  Medication Sig Dispense Refill   acetaminophen (TYLENOL) 325 MG tablet Take 2 tablets (650 mg total) by mouth every 6 (six) hours as needed for mild pain, moderate pain, fever or headache. 60 tablet 0   aluminum-magnesium hydroxide-simethicone (MAALOX) 200-200-20 MG/5ML SUSP Take 30 mLs by mouth 4 (four) times daily -  before meals and at bedtime.     Amino Acids-Protein Hydrolys (FEEDING SUPPLEMENT, PRO-STAT SUGAR FREE 64,) LIQD Take 30 mLs by mouth in the morning and at bedtime.     ascorbic acid (VITAMIN C) 500 MG tablet Take 500 mg by mouth 2 (two) times daily.     aspirin EC 81 MG tablet Take 81 mg by mouth daily. Swallow whole.     atorvastatin (LIPITOR) 80 MG tablet Take 1 tablet (80 mg total) by mouth daily. (Patient taking differently: Take 80 mg by mouth at bedtime.) 30 tablet 0   cholecalciferol (VITAMIN D3) 25 MCG (1000 UNIT) tablet Take 1,000 Units by mouth daily.  clopidogrel (PLAVIX) 75 MG tablet Take 1 tablet (75 mg total) by mouth daily. 30 tablet 3   cyanocobalamin (VITAMIN B12) 1000 MCG tablet Take 1,000 mcg by mouth daily.     ferrous sulfate 325 (65 FE) MG tablet Take 325 mg by mouth every other day.     glimepiride (AMARYL) 4 MG tablet Take 4 mg by mouth daily.     Glucagon, rDNA, (GLUCAGON EMERGENCY) 1 MG KIT Inject 1 mg into the muscle once as needed (low blood sugar).     insulin lispro (HUMALOG) 100 UNIT/ML KwikPen Inject into the skin.     lisinopril (ZESTRIL) 10 MG tablet Take 10 mg by mouth daily.     magnesium oxide (MAG-OX) 400 (240 Mg) MG tablet Take 400 mg by mouth daily.     Multiple Vitamins-Minerals  (MULTIVITAMIN WITH MINERALS) tablet Take 1 tablet by mouth daily.     oxycodone-acetaminophen (LYNOX) 5-300 MG tablet Take 1 tablet by mouth every 4 (four) hours as needed for pain.     Vitamin D, Ergocalciferol, (DRISDOL) 1.25 MG (50000 UNIT) CAPS capsule Take 50,000 Units by mouth every 7 (seven) days. thursday     No current facility-administered medications on file prior to visit.    There are no Patient Instructions on file for this visit. No follow-ups on file.   Georgiana Spinner, NP

## 2022-09-30 DIAGNOSIS — M6259 Muscle wasting and atrophy, not elsewhere classified, multiple sites: Secondary | ICD-10-CM | POA: Diagnosis not present

## 2022-09-30 DIAGNOSIS — Z741 Need for assistance with personal care: Secondary | ICD-10-CM | POA: Diagnosis not present

## 2022-09-30 DIAGNOSIS — R2681 Unsteadiness on feet: Secondary | ICD-10-CM | POA: Diagnosis not present

## 2022-09-30 DIAGNOSIS — R278 Other lack of coordination: Secondary | ICD-10-CM | POA: Diagnosis not present

## 2022-09-30 DIAGNOSIS — M24522 Contracture, left elbow: Secondary | ICD-10-CM | POA: Diagnosis not present

## 2022-09-30 DIAGNOSIS — I739 Peripheral vascular disease, unspecified: Secondary | ICD-10-CM | POA: Diagnosis not present

## 2022-09-30 DIAGNOSIS — M24532 Contracture, left wrist: Secondary | ICD-10-CM | POA: Diagnosis not present

## 2022-09-30 DIAGNOSIS — I69354 Hemiplegia and hemiparesis following cerebral infarction affecting left non-dominant side: Secondary | ICD-10-CM | POA: Diagnosis not present

## 2022-09-30 DIAGNOSIS — M24542 Contracture, left hand: Secondary | ICD-10-CM | POA: Diagnosis not present

## 2022-10-01 DIAGNOSIS — R278 Other lack of coordination: Secondary | ICD-10-CM | POA: Diagnosis not present

## 2022-10-01 DIAGNOSIS — I739 Peripheral vascular disease, unspecified: Secondary | ICD-10-CM | POA: Diagnosis not present

## 2022-10-01 DIAGNOSIS — M24542 Contracture, left hand: Secondary | ICD-10-CM | POA: Diagnosis not present

## 2022-10-01 DIAGNOSIS — M24522 Contracture, left elbow: Secondary | ICD-10-CM | POA: Diagnosis not present

## 2022-10-01 DIAGNOSIS — R2681 Unsteadiness on feet: Secondary | ICD-10-CM | POA: Diagnosis not present

## 2022-10-01 DIAGNOSIS — I69354 Hemiplegia and hemiparesis following cerebral infarction affecting left non-dominant side: Secondary | ICD-10-CM | POA: Diagnosis not present

## 2022-10-01 DIAGNOSIS — M24532 Contracture, left wrist: Secondary | ICD-10-CM | POA: Diagnosis not present

## 2022-10-01 DIAGNOSIS — Z741 Need for assistance with personal care: Secondary | ICD-10-CM | POA: Diagnosis not present

## 2022-10-01 DIAGNOSIS — M6259 Muscle wasting and atrophy, not elsewhere classified, multiple sites: Secondary | ICD-10-CM | POA: Diagnosis not present

## 2022-10-02 DIAGNOSIS — Z741 Need for assistance with personal care: Secondary | ICD-10-CM | POA: Diagnosis not present

## 2022-10-02 DIAGNOSIS — I69354 Hemiplegia and hemiparesis following cerebral infarction affecting left non-dominant side: Secondary | ICD-10-CM | POA: Diagnosis not present

## 2022-10-02 DIAGNOSIS — M6259 Muscle wasting and atrophy, not elsewhere classified, multiple sites: Secondary | ICD-10-CM | POA: Diagnosis not present

## 2022-10-02 DIAGNOSIS — M24532 Contracture, left wrist: Secondary | ICD-10-CM | POA: Diagnosis not present

## 2022-10-02 DIAGNOSIS — R2681 Unsteadiness on feet: Secondary | ICD-10-CM | POA: Diagnosis not present

## 2022-10-02 DIAGNOSIS — R278 Other lack of coordination: Secondary | ICD-10-CM | POA: Diagnosis not present

## 2022-10-02 DIAGNOSIS — M24522 Contracture, left elbow: Secondary | ICD-10-CM | POA: Diagnosis not present

## 2022-10-02 DIAGNOSIS — M24542 Contracture, left hand: Secondary | ICD-10-CM | POA: Diagnosis not present

## 2022-10-02 DIAGNOSIS — I739 Peripheral vascular disease, unspecified: Secondary | ICD-10-CM | POA: Diagnosis not present

## 2022-10-03 DIAGNOSIS — Z741 Need for assistance with personal care: Secondary | ICD-10-CM | POA: Diagnosis not present

## 2022-10-03 DIAGNOSIS — R278 Other lack of coordination: Secondary | ICD-10-CM | POA: Diagnosis not present

## 2022-10-03 DIAGNOSIS — M6259 Muscle wasting and atrophy, not elsewhere classified, multiple sites: Secondary | ICD-10-CM | POA: Diagnosis not present

## 2022-10-03 DIAGNOSIS — M24522 Contracture, left elbow: Secondary | ICD-10-CM | POA: Diagnosis not present

## 2022-10-03 DIAGNOSIS — I69354 Hemiplegia and hemiparesis following cerebral infarction affecting left non-dominant side: Secondary | ICD-10-CM | POA: Diagnosis not present

## 2022-10-03 DIAGNOSIS — R2681 Unsteadiness on feet: Secondary | ICD-10-CM | POA: Diagnosis not present

## 2022-10-03 DIAGNOSIS — M24542 Contracture, left hand: Secondary | ICD-10-CM | POA: Diagnosis not present

## 2022-10-03 DIAGNOSIS — I739 Peripheral vascular disease, unspecified: Secondary | ICD-10-CM | POA: Diagnosis not present

## 2022-10-03 DIAGNOSIS — M24532 Contracture, left wrist: Secondary | ICD-10-CM | POA: Diagnosis not present

## 2022-10-06 DIAGNOSIS — R2681 Unsteadiness on feet: Secondary | ICD-10-CM | POA: Diagnosis not present

## 2022-10-06 DIAGNOSIS — M6259 Muscle wasting and atrophy, not elsewhere classified, multiple sites: Secondary | ICD-10-CM | POA: Diagnosis not present

## 2022-10-06 DIAGNOSIS — R278 Other lack of coordination: Secondary | ICD-10-CM | POA: Diagnosis not present

## 2022-10-06 DIAGNOSIS — I739 Peripheral vascular disease, unspecified: Secondary | ICD-10-CM | POA: Diagnosis not present

## 2022-10-06 DIAGNOSIS — Z741 Need for assistance with personal care: Secondary | ICD-10-CM | POA: Diagnosis not present

## 2022-10-06 DIAGNOSIS — M24532 Contracture, left wrist: Secondary | ICD-10-CM | POA: Diagnosis not present

## 2022-10-06 DIAGNOSIS — I69354 Hemiplegia and hemiparesis following cerebral infarction affecting left non-dominant side: Secondary | ICD-10-CM | POA: Diagnosis not present

## 2022-10-06 DIAGNOSIS — M24522 Contracture, left elbow: Secondary | ICD-10-CM | POA: Diagnosis not present

## 2022-10-06 DIAGNOSIS — M24542 Contracture, left hand: Secondary | ICD-10-CM | POA: Diagnosis not present

## 2022-10-07 DIAGNOSIS — M24542 Contracture, left hand: Secondary | ICD-10-CM | POA: Diagnosis not present

## 2022-10-07 DIAGNOSIS — Z741 Need for assistance with personal care: Secondary | ICD-10-CM | POA: Diagnosis not present

## 2022-10-07 DIAGNOSIS — M6259 Muscle wasting and atrophy, not elsewhere classified, multiple sites: Secondary | ICD-10-CM | POA: Diagnosis not present

## 2022-10-07 DIAGNOSIS — M24532 Contracture, left wrist: Secondary | ICD-10-CM | POA: Diagnosis not present

## 2022-10-07 DIAGNOSIS — I739 Peripheral vascular disease, unspecified: Secondary | ICD-10-CM | POA: Diagnosis not present

## 2022-10-07 DIAGNOSIS — M24522 Contracture, left elbow: Secondary | ICD-10-CM | POA: Diagnosis not present

## 2022-10-07 DIAGNOSIS — I69354 Hemiplegia and hemiparesis following cerebral infarction affecting left non-dominant side: Secondary | ICD-10-CM | POA: Diagnosis not present

## 2022-10-07 DIAGNOSIS — R2681 Unsteadiness on feet: Secondary | ICD-10-CM | POA: Diagnosis not present

## 2022-10-07 DIAGNOSIS — R278 Other lack of coordination: Secondary | ICD-10-CM | POA: Diagnosis not present

## 2022-10-08 DIAGNOSIS — M24542 Contracture, left hand: Secondary | ICD-10-CM | POA: Diagnosis not present

## 2022-10-08 DIAGNOSIS — R2681 Unsteadiness on feet: Secondary | ICD-10-CM | POA: Diagnosis not present

## 2022-10-08 DIAGNOSIS — I69354 Hemiplegia and hemiparesis following cerebral infarction affecting left non-dominant side: Secondary | ICD-10-CM | POA: Diagnosis not present

## 2022-10-08 DIAGNOSIS — M6259 Muscle wasting and atrophy, not elsewhere classified, multiple sites: Secondary | ICD-10-CM | POA: Diagnosis not present

## 2022-10-08 DIAGNOSIS — R278 Other lack of coordination: Secondary | ICD-10-CM | POA: Diagnosis not present

## 2022-10-08 DIAGNOSIS — I739 Peripheral vascular disease, unspecified: Secondary | ICD-10-CM | POA: Diagnosis not present

## 2022-10-08 DIAGNOSIS — Z741 Need for assistance with personal care: Secondary | ICD-10-CM | POA: Diagnosis not present

## 2022-10-08 DIAGNOSIS — M24532 Contracture, left wrist: Secondary | ICD-10-CM | POA: Diagnosis not present

## 2022-10-08 DIAGNOSIS — M24522 Contracture, left elbow: Secondary | ICD-10-CM | POA: Diagnosis not present

## 2022-10-09 DIAGNOSIS — Z741 Need for assistance with personal care: Secondary | ICD-10-CM | POA: Diagnosis not present

## 2022-10-09 DIAGNOSIS — M24542 Contracture, left hand: Secondary | ICD-10-CM | POA: Diagnosis not present

## 2022-10-09 DIAGNOSIS — R2681 Unsteadiness on feet: Secondary | ICD-10-CM | POA: Diagnosis not present

## 2022-10-09 DIAGNOSIS — M24522 Contracture, left elbow: Secondary | ICD-10-CM | POA: Diagnosis not present

## 2022-10-09 DIAGNOSIS — M24532 Contracture, left wrist: Secondary | ICD-10-CM | POA: Diagnosis not present

## 2022-10-09 DIAGNOSIS — M6259 Muscle wasting and atrophy, not elsewhere classified, multiple sites: Secondary | ICD-10-CM | POA: Diagnosis not present

## 2022-10-09 DIAGNOSIS — I739 Peripheral vascular disease, unspecified: Secondary | ICD-10-CM | POA: Diagnosis not present

## 2022-10-09 DIAGNOSIS — R278 Other lack of coordination: Secondary | ICD-10-CM | POA: Diagnosis not present

## 2022-10-09 DIAGNOSIS — I69354 Hemiplegia and hemiparesis following cerebral infarction affecting left non-dominant side: Secondary | ICD-10-CM | POA: Diagnosis not present

## 2022-10-10 DIAGNOSIS — M24542 Contracture, left hand: Secondary | ICD-10-CM | POA: Diagnosis not present

## 2022-10-10 DIAGNOSIS — R2681 Unsteadiness on feet: Secondary | ICD-10-CM | POA: Diagnosis not present

## 2022-10-10 DIAGNOSIS — M6259 Muscle wasting and atrophy, not elsewhere classified, multiple sites: Secondary | ICD-10-CM | POA: Diagnosis not present

## 2022-10-10 DIAGNOSIS — I739 Peripheral vascular disease, unspecified: Secondary | ICD-10-CM | POA: Diagnosis not present

## 2022-10-10 DIAGNOSIS — M24532 Contracture, left wrist: Secondary | ICD-10-CM | POA: Diagnosis not present

## 2022-10-10 DIAGNOSIS — Z741 Need for assistance with personal care: Secondary | ICD-10-CM | POA: Diagnosis not present

## 2022-10-10 DIAGNOSIS — I69354 Hemiplegia and hemiparesis following cerebral infarction affecting left non-dominant side: Secondary | ICD-10-CM | POA: Diagnosis not present

## 2022-10-10 DIAGNOSIS — R278 Other lack of coordination: Secondary | ICD-10-CM | POA: Diagnosis not present

## 2022-10-10 DIAGNOSIS — M24522 Contracture, left elbow: Secondary | ICD-10-CM | POA: Diagnosis not present

## 2022-11-17 ENCOUNTER — Other Ambulatory Visit (INDEPENDENT_AMBULATORY_CARE_PROVIDER_SITE_OTHER): Payer: Self-pay | Admitting: Nurse Practitioner

## 2022-11-17 DIAGNOSIS — Z9889 Other specified postprocedural states: Secondary | ICD-10-CM

## 2022-11-20 ENCOUNTER — Other Ambulatory Visit (INDEPENDENT_AMBULATORY_CARE_PROVIDER_SITE_OTHER): Payer: Self-pay | Admitting: Nurse Practitioner

## 2022-11-20 ENCOUNTER — Ambulatory Visit (INDEPENDENT_AMBULATORY_CARE_PROVIDER_SITE_OTHER): Payer: Medicare HMO

## 2022-11-20 ENCOUNTER — Ambulatory Visit (INDEPENDENT_AMBULATORY_CARE_PROVIDER_SITE_OTHER): Payer: Medicare HMO | Admitting: Vascular Surgery

## 2022-11-20 DIAGNOSIS — Z9889 Other specified postprocedural states: Secondary | ICD-10-CM

## 2022-11-20 DIAGNOSIS — I739 Peripheral vascular disease, unspecified: Secondary | ICD-10-CM

## 2022-12-17 IMAGING — US US EXTREM LOW VENOUS*L*
1 series · 13 of 24 positions shown · non-contrast
Comparison: None.

CLINICAL DATA: Left lower extremity edema and pain with erythema.



[Series 1: us venous img lower uni left (dvt) · portal-venous · 13 of 35 slices shown]
[im 1/35]
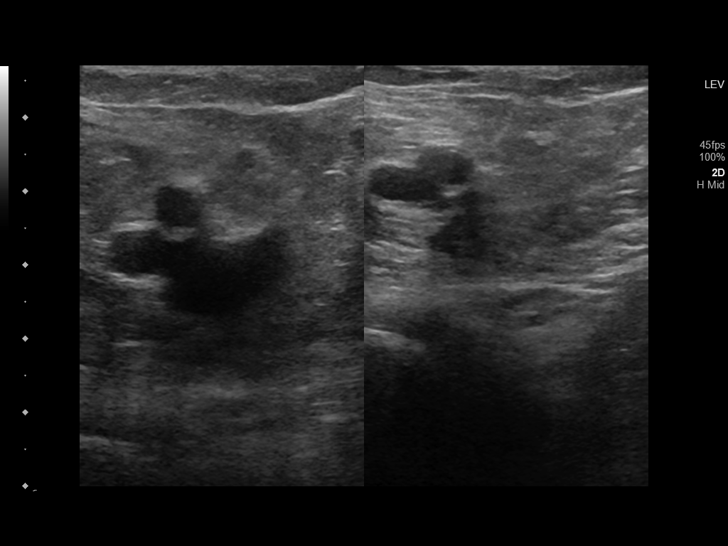
[im 3/35]
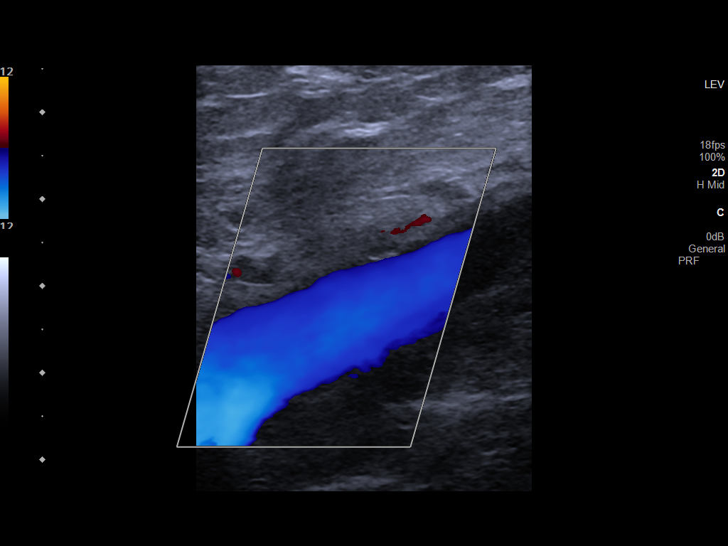
[im 6/35]
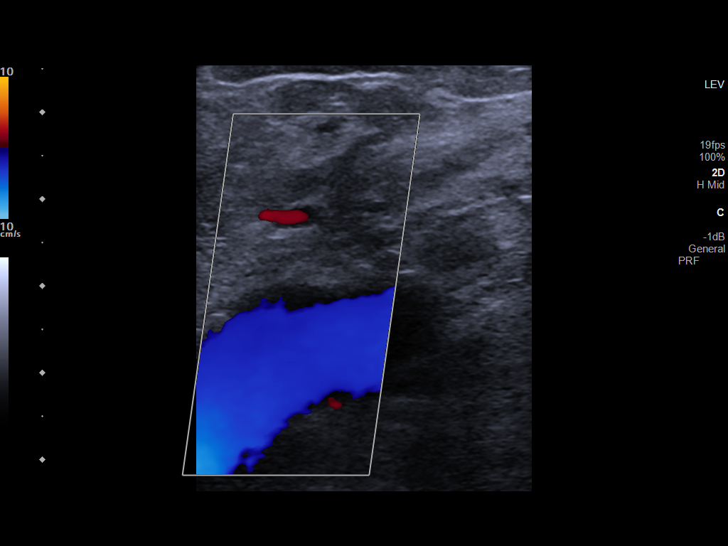
[im 9/35]
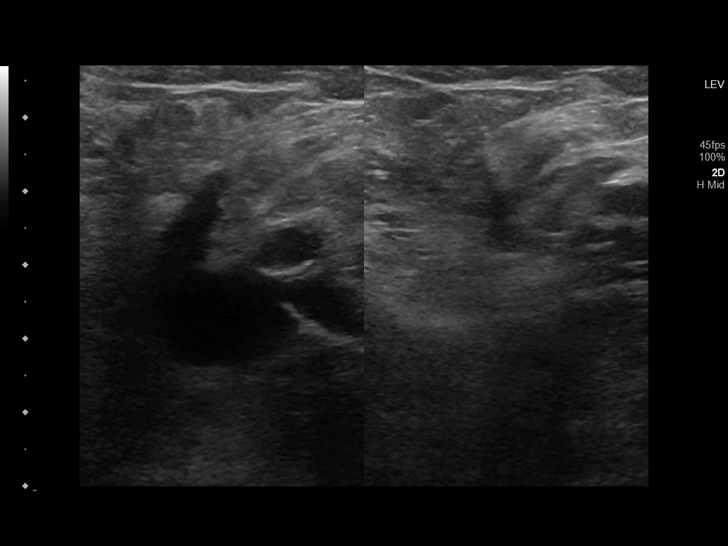
[im 12/35]
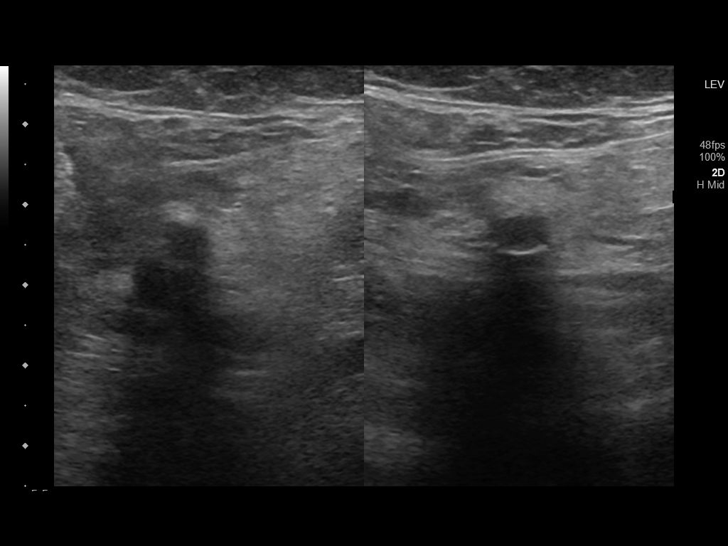
[im 15/35]
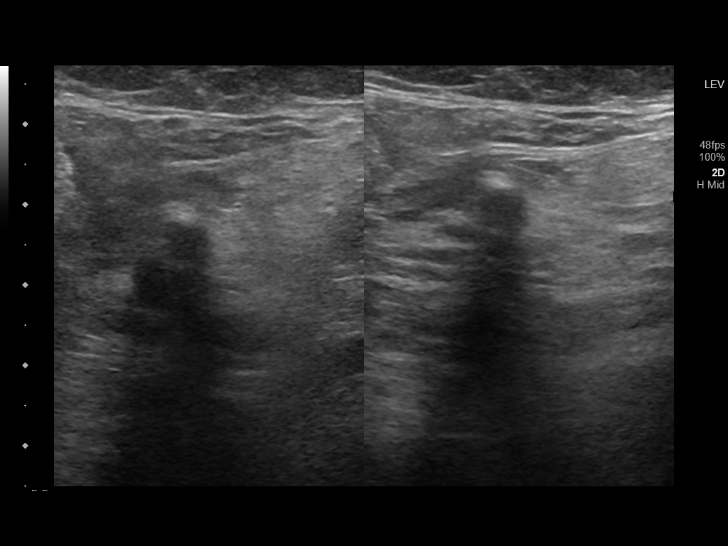
[im 18/35]
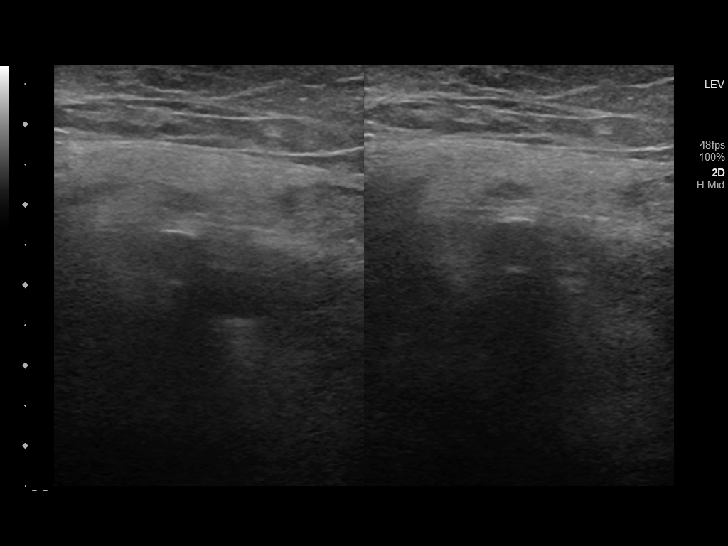
[im 20/35]
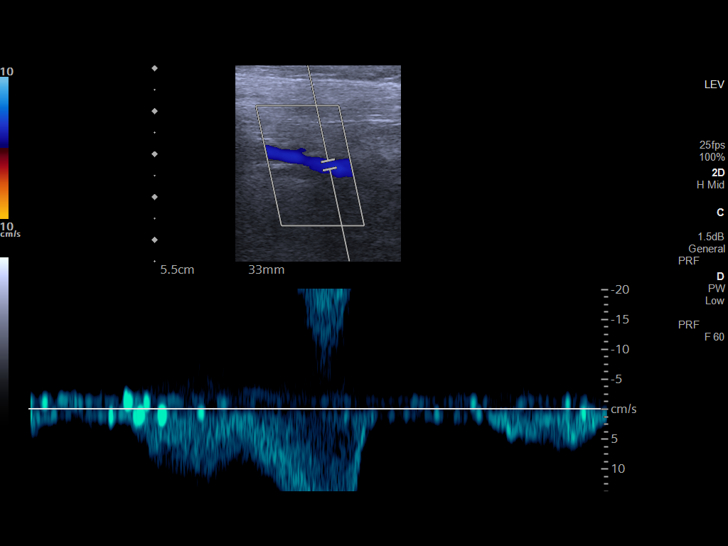
[im 23/35]
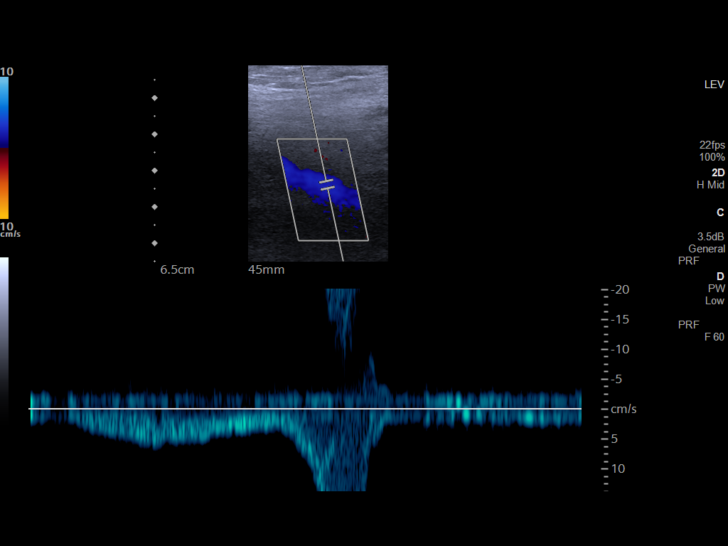
[im 26/35]
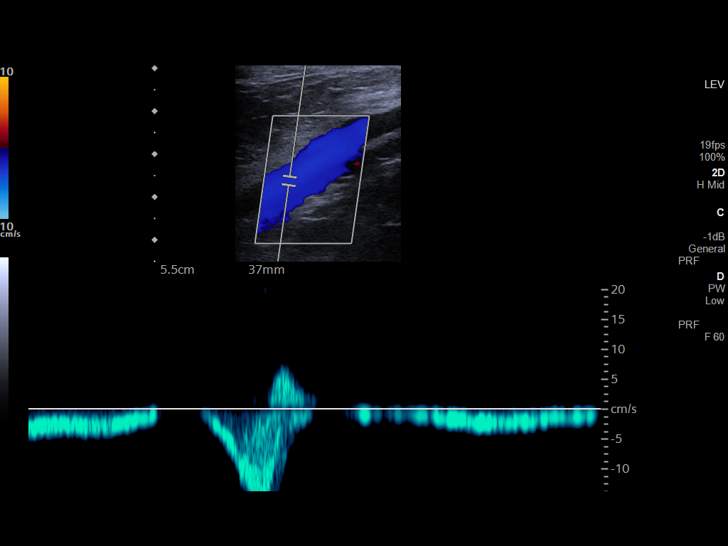
[im 29/35]
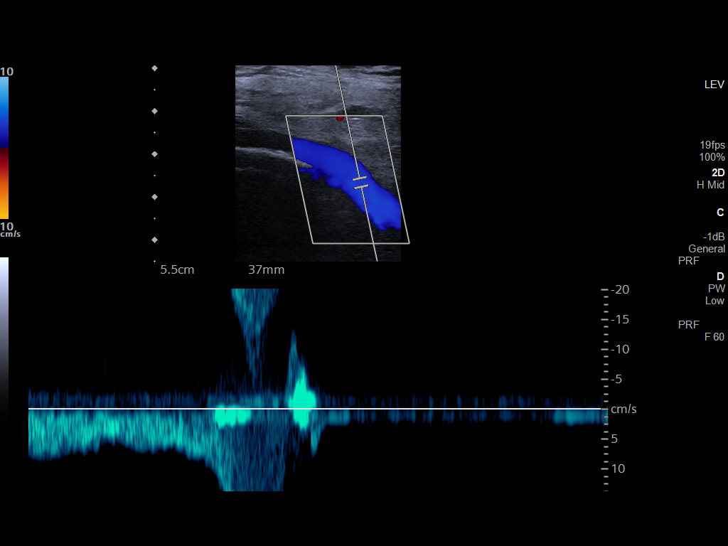
[im 32/35]
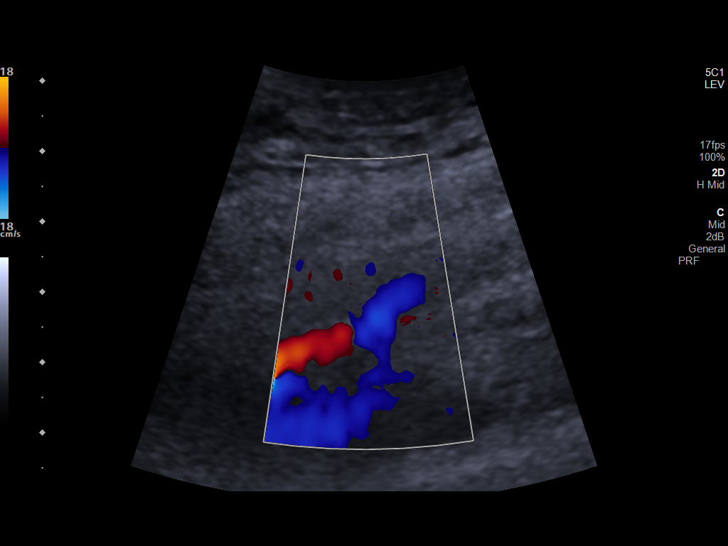
[im 35/35]
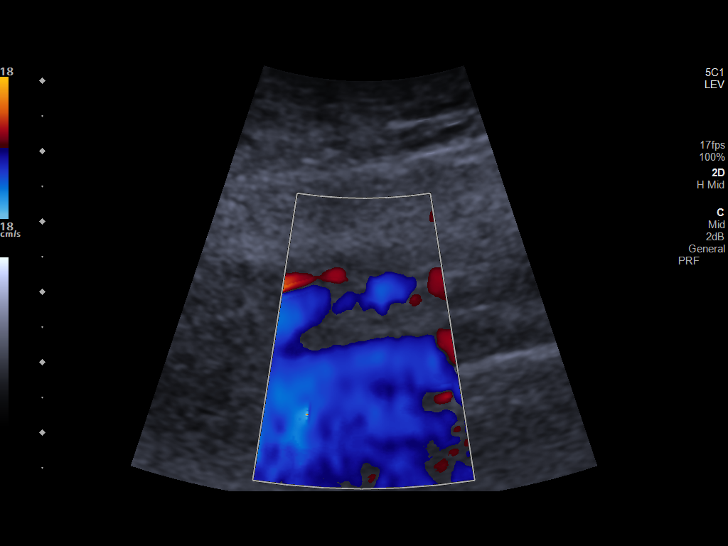

[13 of 24 positions shown; findings below may reference images not displayed]

FINDINGS: Contralateral Common Femoral Vein: Respiratory phasicity is normal
and symmetric with the symptomatic side. No evidence of thrombus.
Normal compressibility.

Common Femoral Vein: No evidence of thrombus. Normal
compressibility, respiratory phasicity and response to augmentation.

Saphenofemoral Junction: No evidence of thrombus. Normal
compressibility and flow on color Doppler imaging.

Profunda Femoral Vein: No evidence of thrombus. Normal
compressibility and flow on color Doppler imaging.

Femoral Vein: No evidence of thrombus. Normal compressibility,
respiratory phasicity and response to augmentation.

Popliteal Vein: No evidence of thrombus. Normal compressibility,
respiratory phasicity and response to augmentation.

Calf Veins: No evidence of thrombus. Normal compressibility and flow
on color Doppler imaging.

Superficial Great Saphenous Vein: No evidence of thrombus. Normal
compressibility.

Venous Reflux:  None.

Other Findings: No evidence of superficial thrombophlebitis or
abnormal fluid collection.
IMPRESSION: No evidence of left lower extremity deep venous thrombosis.

## 2022-12-19 DIAGNOSIS — Z23 Encounter for immunization: Secondary | ICD-10-CM | POA: Diagnosis not present

## 2022-12-19 IMAGING — US US EXTREM LOW ARTERIAL SEG MULTIPLE*L*
1 series · 14 of 25 positions shown · non-contrast
Comparison: None.

CLINICAL DATA: Ischemia

Hypertension
Hyperlipidemia
Diabetes
EXAM:
LEFT LOWER EXTREMITY ARTERIAL DUPLEX SCAN
TECHNIQUE: Gray-scale sonography as well as color Doppler and duplex ultrasound
was performed to evaluate the lower extremity arteries including the
common, superficial and profunda femoral arteries, popliteal artery
and calf arteries.

[Series 1: us arterial lower extremity duplex left (non-abi) · arterial · 14 of 30 slices shown]
[im 1/30]
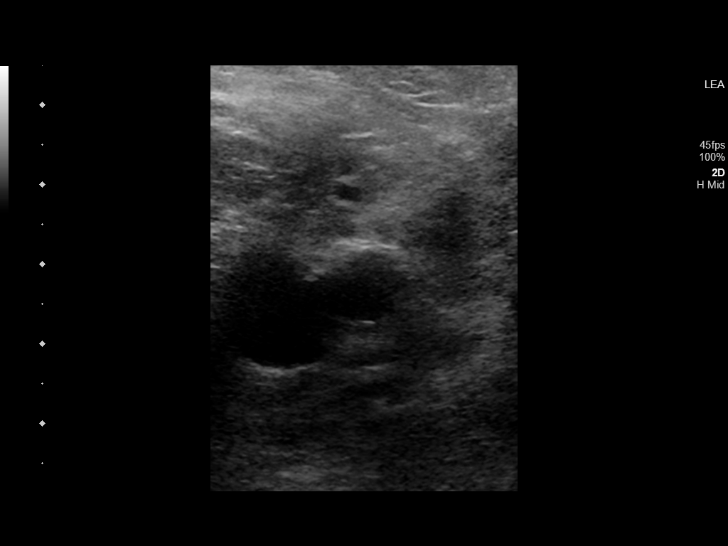
[im 3/30]
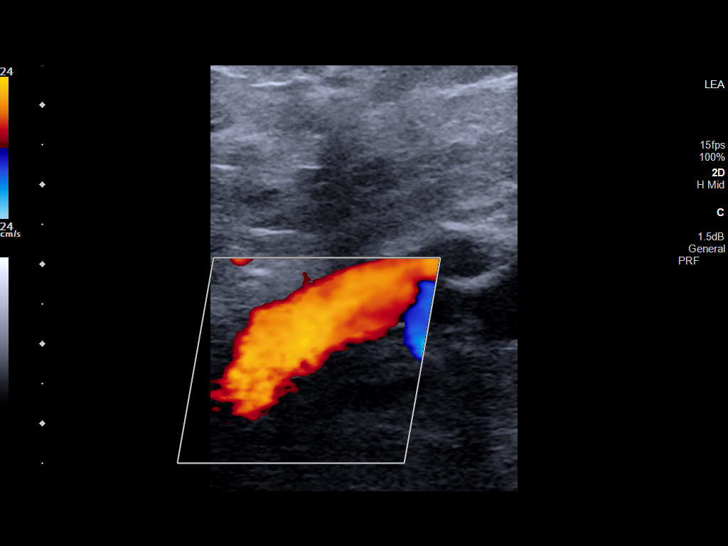
[im 5/30]
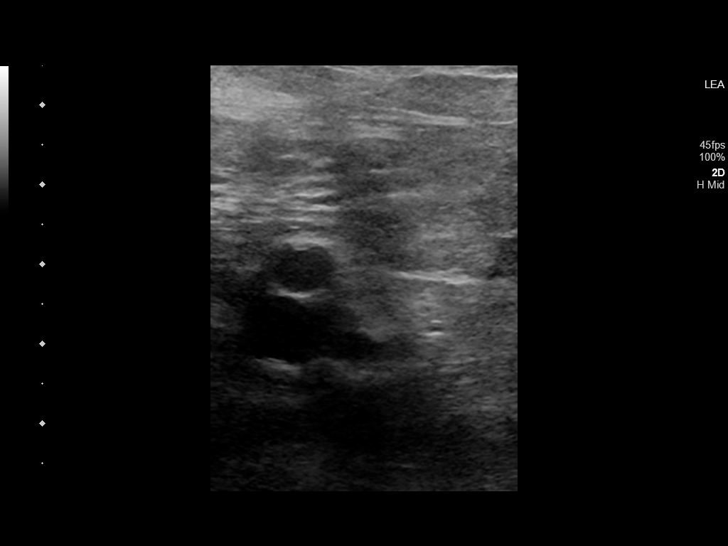
[im 8/30]
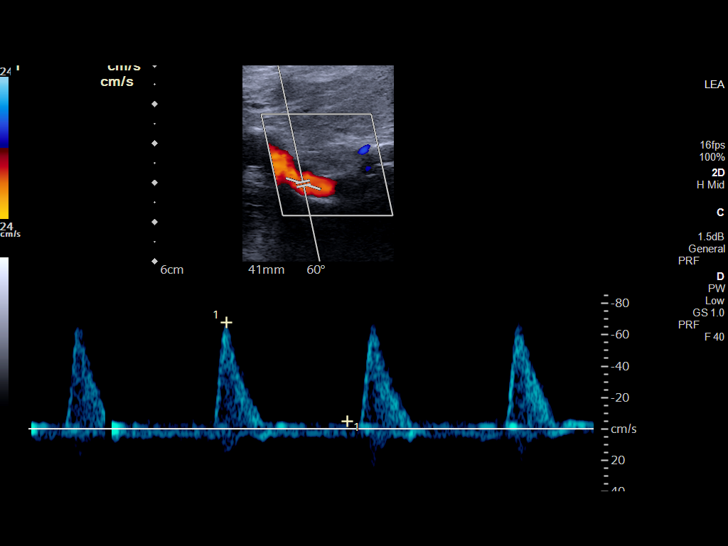
[im 10/30]
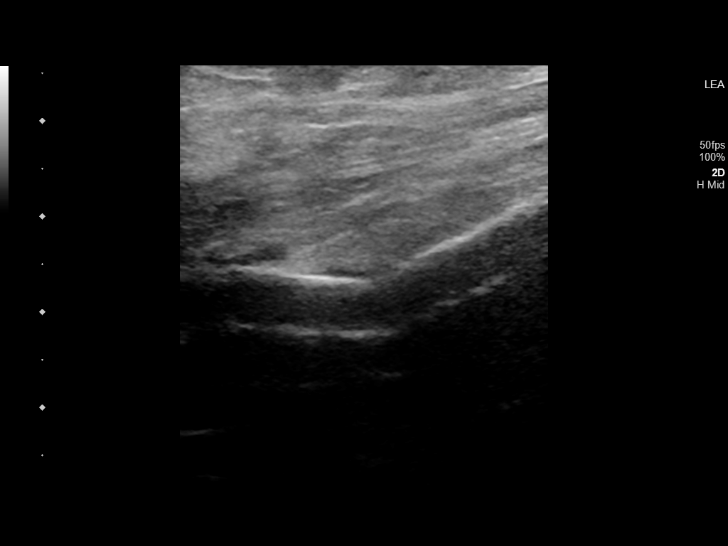
[im 11/30]
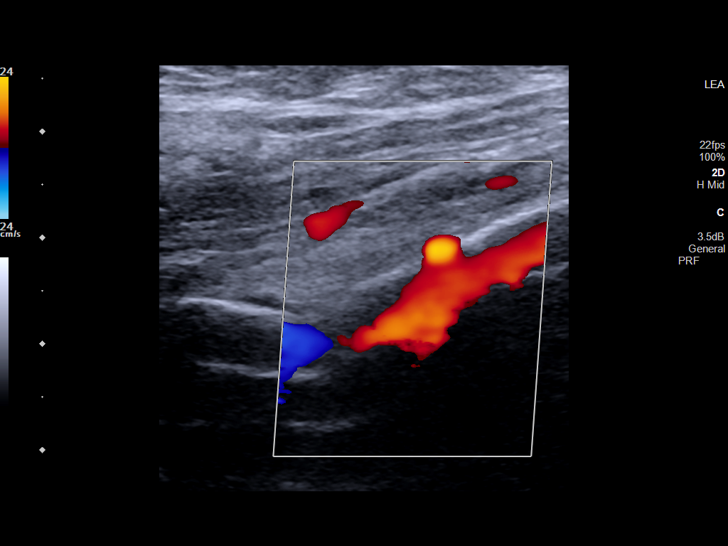
[im 14/30]
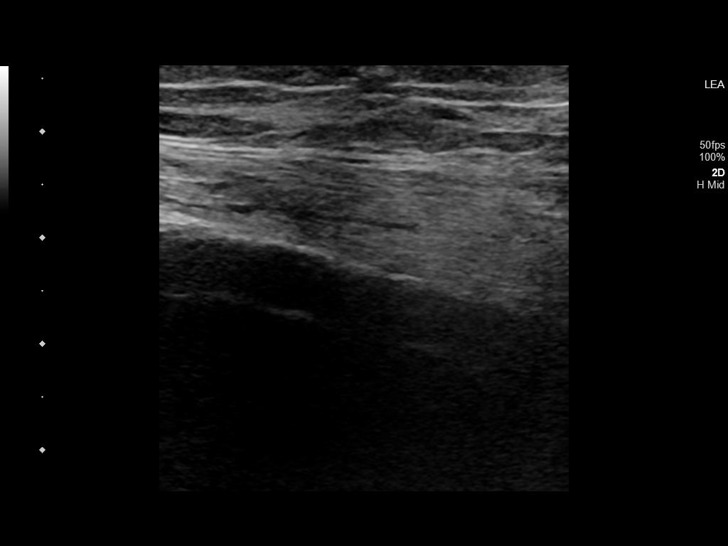
[im 16/30]
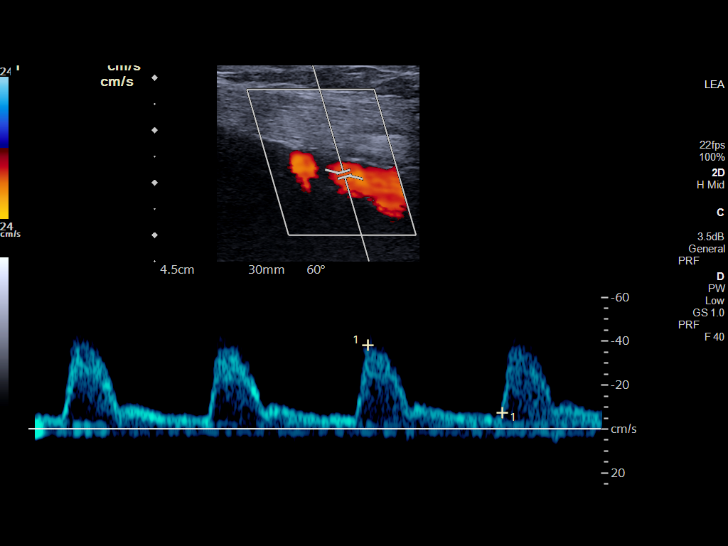
[im 19/30]
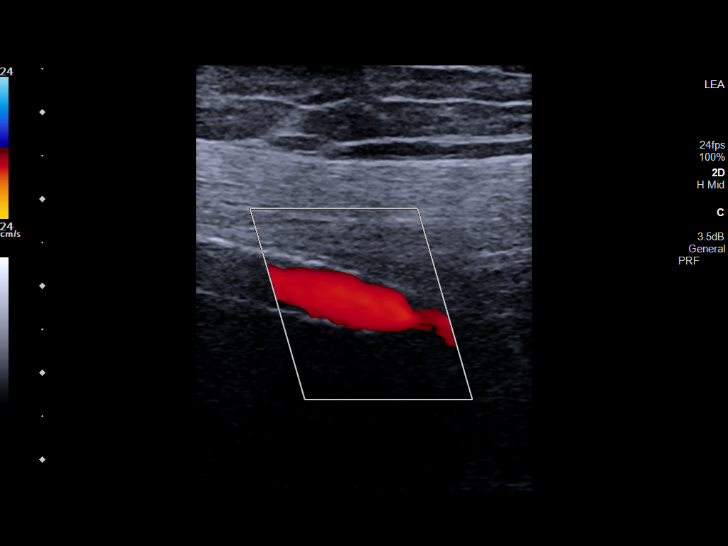
[im 20/30]
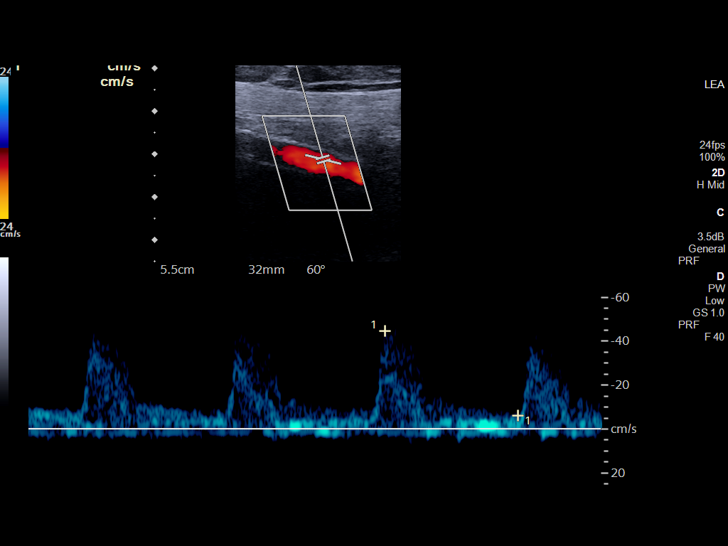
[im 22/30]
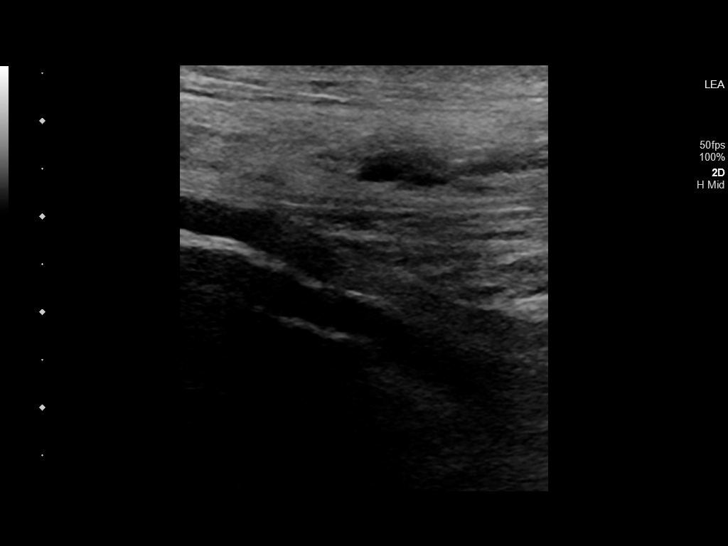
[im 25/30]
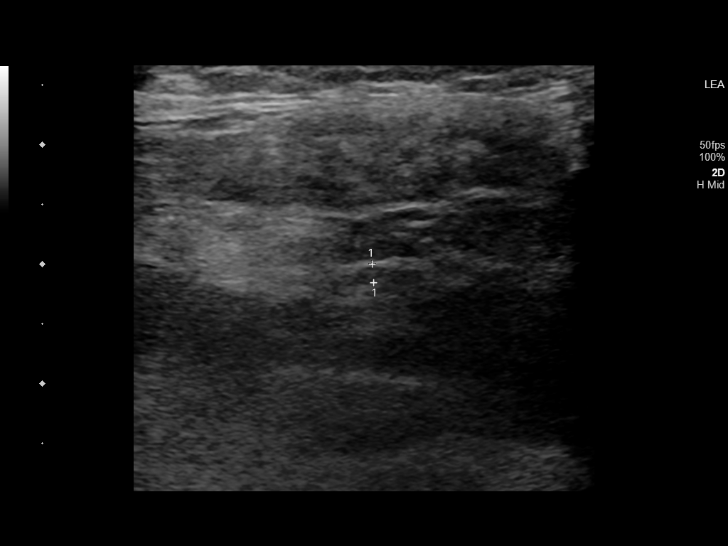
[im 27/30]
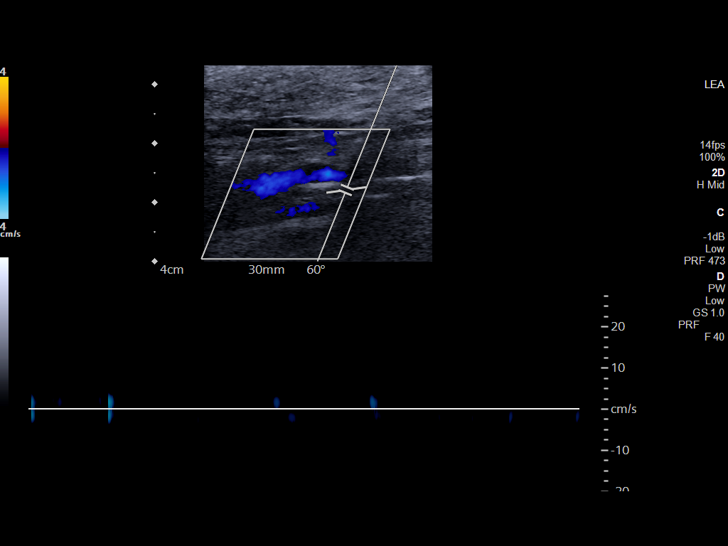
[im 30/30]
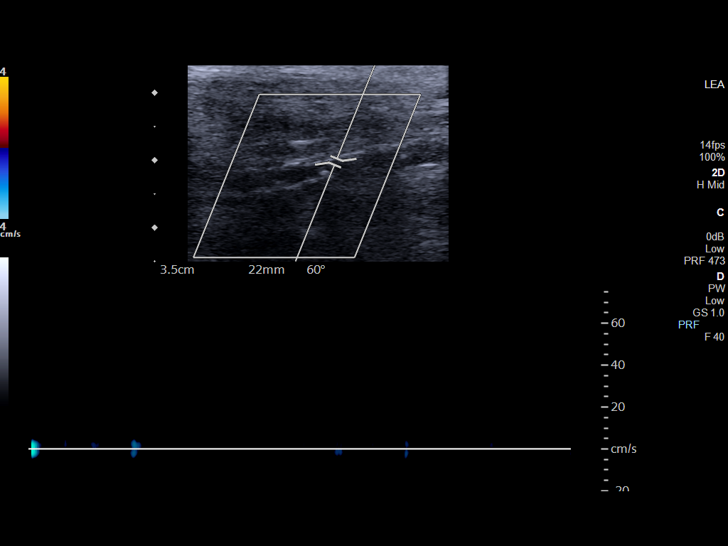

[14 of 25 positions shown; findings below may reference images not displayed]

FINDINGS: Left lower Extremity

Outflow: Monophasic flow seen in the left common femoral,
superficial femoral, profunda femoris arteries.

Runoff: No flow identified in the popliteal, anterior tibial,
posterior tibial arteries.
IMPRESSION: Findings consistent with significant lower extremity arterial
occlusive disease with no flow seen in the popliteal, anterior
tibial, and posterior tibial arteries. Monophasic flow noted in the
left common femoral, superficial femoral, and profunda femoris
arteries.

## 2022-12-22 ENCOUNTER — Ambulatory Visit (INDEPENDENT_AMBULATORY_CARE_PROVIDER_SITE_OTHER): Payer: Medicare HMO | Admitting: Vascular Surgery

## 2023-01-21 ENCOUNTER — Other Ambulatory Visit (INDEPENDENT_AMBULATORY_CARE_PROVIDER_SITE_OTHER): Payer: Self-pay | Admitting: Nurse Practitioner

## 2023-01-21 DIAGNOSIS — M1990 Unspecified osteoarthritis, unspecified site: Secondary | ICD-10-CM | POA: Insufficient documentation

## 2023-01-21 DIAGNOSIS — I739 Peripheral vascular disease, unspecified: Secondary | ICD-10-CM

## 2023-01-21 NOTE — Progress Notes (Signed)
MRN : [id number]  [name] is a 89 y.o. (1933-02-23) female who presents with chief complaint of check circulation.  History of Present Illness:   The patient returns to the office for followup and review status post angiogram with intervention on 08/11/2022.    Procedure: Procedure(s) Performed:             1.  Introduction catheter into left lower extremity 3rd order catheter placement               2.    Contrast injection left lower extremity for distal runoff             3.  Percutaneous transluminal angioplasty left superficial femoral and popliteal arteries to 6 mm             4.  Percutaneous transluminal angioplasty left tibioperoneal trunk and peroneal to 3 mm             5.  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  The patient has a wound vacuum attached.  This is being managed.  Podiatry.  Patient did not bring supplies today so it was not removed.  She also has dry gangrene of her left great toe does not note any significant pain.  No evidence of drainage from the toe as well.   There have been no significant changes to the patient's overall health care.   No documented history of amaurosis fugax or recent TIA symptoms. There are no recent neurological changes noted. No documented history of DVT, PE or superficial thrombophlebitis. The patient denies recent episodes of angina or shortness of breath.    Previous arterial duplex of the left lower extremity shows biphasic waveforms until the level of the tibial vessels which shows monophasic vessels.  The anterior tibial and posterior tibial are known to be occluded where shows to be some occlusion of the distal portion of the peroneal but function of the foot is noted by collaterals  ABI today Rt=1.14 (monophasic) and Lt=1.01  (monophasic) (previous ABI's Rt=1.14 and Lt=1.01)  No outpatient medications have been marked as taking for the 01/22/23 encounter (Appointment) with Gilda Crease, Latina Craver, MD.    Past Medical History:  Diagnosis Date   Carpal tunnel syndrome on both sides    Diabetes [facility])    Diabetic polyneuropathy (HCC)    Diverticulitis    Hyperlipidemia    Hypertension    Osteoarthritis    Vaginal prolapse     Past Surgical History:  Procedure Laterality Date   AMPUTATION TOE Left 08/13/2022   Procedure: EXCISION OF 5TH RAY;  Surgeon: Gwyneth Revels, DPM;  Location: [facility];  Service: Orthopedics/Podiatry;  Laterality: Left;   CESAREAN SECTION     IRRIGATION AND DEBRIDEMENT FOOT Left 01/09/2022   Procedure: IRRIGATION AND DEBRIDEMENT FOOT WITH BONE BIOPSY;  Surgeon: Gwyneth Revels, DPM;  Location: [facility];  Service: Podiatry;  Laterality: Left;   LOWER EXTREMITY ANGIOGRAPHY Left 06/11/2021  Procedure: Lower Extremity Angiography;  Surgeon: Renford Dills, MD;  Location: [facility];  Service: Cardiovascular;  Laterality: Left;   LOWER EXTREMITY ANGIOGRAPHY Left 08/11/2022   Procedure: Lower Extremity Angiography;  Surgeon: Renford Dills, MD;  Location: [facility];  Service: Cardiovascular;  Laterality: Left;   pessary     REPLACEMENT TOTAL KNEE BILATERAL     VENTRAL HERNIA REPAIR     VENTRAL HERNIA REPAIR N/A 01/13/2016   Procedure: HERNIA REPAIR VENTRAL ADULT WITH SMALL BOWEL RESECTION, LYSIS OF ADHESIONS;  Surgeon: Gladis Riffle, MD;  Location: [facility];  Service: General;  Laterality: N/A;    Social History Social History   Tobacco Use   Smoking status: Never   Smokeless tobacco: Never  Vaping Use   Vaping status: Never Used  Substance Use Topics   Alcohol use: No   Drug use: No    Family History Family History  Problem Relation Age of Onset   Breast cancer Mother 13   Breast cancer Maternal Aunt     Allergies  Allergen Reactions   Metformin  And Related Nausea And Vomiting     REVIEW OF SYSTEMS (Negative unless checked)  Constitutional: [] Weight loss  [] Fever  [] Chills Cardiac: [] Chest pain   [] Chest pressure   [] Palpitations   [] Shortness of breath when laying flat   [] Shortness of breath with exertion. Vascular:  [x] Pain in legs with walking   [] Pain in legs at rest  [] History of DVT   [] Phlebitis   [] Swelling in legs   [] Varicose veins   [] Non-healing ulcers Pulmonary:   [] Uses home oxygen   [] Productive cough   [] Hemoptysis   [] Wheeze  [] COPD   [] Asthma Neurologic:  [] Dizziness   [] Seizures   [] History of stroke   [] History of TIA  [] Aphasia   [] Vissual changes   [] Weakness or numbness in arm   [] Weakness or numbness in leg Musculoskeletal:   [] Joint swelling   [] Joint pain   [] Low back pain Hematologic:  [] Easy bruising  [] Easy bleeding   [] Hypercoagulable state   [] Anemic Gastrointestinal:  [] Diarrhea   [] Vomiting  [] Gastroesophageal reflux/heartburn   [] Difficulty swallowing. Genitourinary:  [] Chronic kidney disease   [] Difficult urination  [] Frequent urination   [] Blood in urine Skin:  [] Rashes   [x] Ulcers  Psychological:  [] History of anxiety   []  History of major depression.  Physical Examination  There were no vitals filed for this visit. There is no height or weight on file to calculate BMI. Gen: WD/WN, NAD Head: [place]/AT, No temporalis wasting.  Ear/Nose/Throat: Hearing grossly intact, nares w/o erythema or drainage Eyes: PER, EOMI, sclera nonicteric.  Neck: Supple, no masses.  No bruit or JVD.  Pulmonary:  Good air movement, no audible wheezing, no use of accessory muscles.  Cardiac: RRR, normal S1, S2, no Murmurs. Vascular:  mild trophic changes, ulcer left great toe Vessel Right Left  Radial Palpable Palpable  PT Not Palpable Not Palpable  DP Not Palpable Not Palpable  Gastrointestinal: soft, non-distended. No guarding/no peritoneal signs.  Musculoskeletal: M/S 5/5 throughout.  No visible deformity.   Neurologic: CN 2-12 intact. Pain and light touch intact in extremities.  Symmetrical.  Speech is fluent. Motor exam as listed above. Psychiatric: Judgment intact, Mood & affect appropriate for pt's clinical situation. Dermatologic: No rashes or ulcers noted.  No changes consistent with cellulitis.   CBC Lab Results  Component Value Date   WBC 14.2 (H) 08/27/2022   HGB 8.4 (L) 08/27/2022  HCT 26.0 (L) 08/27/2022   MCV 94.1 08/27/2022   PLT 396 08/27/2022    BMET    Component Value Date/Time   NA 135 08/27/2022 0159   NA 143 09/24/2013 0409   K 3.6 08/27/2022 0159   K 3.6 09/24/2013 0409   CL 103 08/27/2022 0159   CL 108 (H) 09/24/2013 0409   CO2 23 08/27/2022 0159   CO2 27 09/24/2013 0409   GLUCOSE 96 08/27/2022 0159   GLUCOSE 222 (H) 09/24/2013 0409   BUN 14 08/27/2022 0159   BUN 7 09/24/2013 0409   CREATININE 0.77 08/27/2022 0159   CREATININE 0.77 09/24/2013 0409   CALCIUM 7.8 (L) 08/27/2022 0159   CALCIUM 7.7 (L) 09/24/2013 0409   GFRNONAA >60 08/27/2022 0159   GFRNONAA >60 09/24/2013 0409   GFRAA >60 09/26/2019 1952   GFRAA >60 09/24/2013 0409   CrCl cannot be calculated (Patient's most recent lab result is older than the maximum 21 days allowed.).  COAG Lab Results  Component Value Date   INR 1.2 08/26/2022   INR 1.2 08/08/2022    Radiology No results found.   Assessment/Plan 1. Atherosclerosis of native arteries of the extremities with ulceration (HCC) (Primary) Recommend:  The patient is status post successful angiogram with intervention.  The patient reports that the claudication symptoms and leg pain has improved.   The patient denies lifestyle limiting changes at this point in time.  No further invasive studies, angiography or surgery at this time. The patient should continue walking and begin a more formal exercise program.  The patient should continue antiplatelet therapy and aggressive treatment of the lipid abnormalities  Continued  surveillance is indicated as atherosclerosis is likely to progress with time.    Patient should undergo noninvasive studies as ordered. The patient will follow up with me to review the studies.  - VAS [place] ABI WITH/WO TBI; Future - VAS [place] LOWER EXTREMITY ARTERIAL DUPLEX; Future  2. Bilateral carotid artery stenosis Recommend:   Given the patient's asymptomatic subcritical stenosis no further invasive testing or surgery at this time.   Duplex ultrasound shows 1-39% stenosis bilaterally.   Continue antiplatelet therapy as prescribed Continue management of CAD, HTN and Hyperlipidemia Healthy heart diet,  encouraged exercise at least 4 times per week Follow up in 12 months with duplex ultrasound and physical exam    3. Essential hypertension Continue antihypertensive medications as already ordered, these medications have been reviewed and there are no changes at this time.  4. Primary osteoarthritis involving multiple joints Continue medications to treat the patient's degenerative disease as already ordered, these medications have been reviewed and there are no changes at this time.  Continued activity and therapy was stressed.  5. Pure hypercholesterolemia Continue pulmonary medications and aerosols as already ordered, these medications have been reviewed and there are no changes at this time.  d   Levora Dredge, MD  01/21/2023 2:17 PM

## 2023-01-22 ENCOUNTER — Ambulatory Visit (INDEPENDENT_AMBULATORY_CARE_PROVIDER_SITE_OTHER): Payer: Medicare HMO

## 2023-01-22 ENCOUNTER — Ambulatory Visit (INDEPENDENT_AMBULATORY_CARE_PROVIDER_SITE_OTHER): Payer: Medicare HMO | Admitting: Vascular Surgery

## 2023-01-22 ENCOUNTER — Encounter (INDEPENDENT_AMBULATORY_CARE_PROVIDER_SITE_OTHER): Payer: Self-pay | Admitting: Vascular Surgery

## 2023-01-22 VITALS — BP 149/84 | HR 66 | Ht 64.0 in | Wt 127.0 lb

## 2023-01-22 DIAGNOSIS — M150 Primary generalized (osteo)arthritis: Secondary | ICD-10-CM | POA: Diagnosis not present

## 2023-01-22 DIAGNOSIS — I7025 Atherosclerosis of native arteries of other extremities with ulceration: Secondary | ICD-10-CM | POA: Diagnosis not present

## 2023-01-22 DIAGNOSIS — E7800 Pure hypercholesterolemia, unspecified: Secondary | ICD-10-CM

## 2023-01-22 DIAGNOSIS — I10 Essential (primary) hypertension: Secondary | ICD-10-CM

## 2023-01-22 DIAGNOSIS — Z9889 Other specified postprocedural states: Secondary | ICD-10-CM

## 2023-01-22 DIAGNOSIS — I739 Peripheral vascular disease, unspecified: Secondary | ICD-10-CM

## 2023-01-22 DIAGNOSIS — I6523 Occlusion and stenosis of bilateral carotid arteries: Secondary | ICD-10-CM

## 2023-01-22 LAB — VAS US ABI WITH/WO TBI
Left ABI: 1.01
Right ABI: 1.14

## 2023-01-23 ENCOUNTER — Encounter (INDEPENDENT_AMBULATORY_CARE_PROVIDER_SITE_OTHER): Payer: Self-pay | Admitting: Vascular Surgery

## 2023-04-09 ENCOUNTER — Other Ambulatory Visit: Payer: Self-pay

## 2023-04-09 ENCOUNTER — Inpatient Hospital Stay
Admission: EM | Admit: 2023-04-09 | Discharge: 2023-04-11 | DRG: 918 | Disposition: A | Discharge status: Skilled Nursing Facility | Payer: Medicaid Other | Source: Skilled Nursing Facility | Attending: Obstetrics and Gynecology | Admitting: Obstetrics and Gynecology

## 2023-04-09 ENCOUNTER — Encounter: Payer: Self-pay | Admitting: Family Medicine

## 2023-04-09 DIAGNOSIS — Z89422 Acquired absence of other left toe(s): Secondary | ICD-10-CM

## 2023-04-09 DIAGNOSIS — I639 Cerebral infarction, unspecified: Secondary | ICD-10-CM | POA: Diagnosis present

## 2023-04-09 DIAGNOSIS — E1142 Type 2 diabetes mellitus with diabetic polyneuropathy: Secondary | ICD-10-CM | POA: Diagnosis present

## 2023-04-09 DIAGNOSIS — E162 Hypoglycemia, unspecified: Principal | ICD-10-CM | POA: Diagnosis present

## 2023-04-09 DIAGNOSIS — E1152 Type 2 diabetes mellitus with diabetic peripheral angiopathy with gangrene: Secondary | ICD-10-CM | POA: Diagnosis present

## 2023-04-09 DIAGNOSIS — T383X1A Poisoning by insulin and oral hypoglycemic [antidiabetic] drugs, accidental (unintentional), initial encounter: Principal | ICD-10-CM | POA: Diagnosis present

## 2023-04-09 DIAGNOSIS — R3989 Other symptoms and signs involving the genitourinary system: Secondary | ICD-10-CM

## 2023-04-09 DIAGNOSIS — E11649 Type 2 diabetes mellitus with hypoglycemia without coma: Secondary | ICD-10-CM | POA: Diagnosis present

## 2023-04-09 DIAGNOSIS — Z794 Long term (current) use of insulin: Secondary | ICD-10-CM

## 2023-04-09 DIAGNOSIS — F0390 Unspecified dementia without behavioral disturbance: Secondary | ICD-10-CM | POA: Diagnosis not present

## 2023-04-09 DIAGNOSIS — Z66 Do not resuscitate: Secondary | ICD-10-CM | POA: Diagnosis present

## 2023-04-09 DIAGNOSIS — I10 Essential (primary) hypertension: Secondary | ICD-10-CM | POA: Diagnosis present

## 2023-04-09 DIAGNOSIS — Z6821 Body mass index (BMI) 21.0-21.9, adult: Secondary | ICD-10-CM

## 2023-04-09 DIAGNOSIS — E785 Hyperlipidemia, unspecified: Secondary | ICD-10-CM | POA: Diagnosis present

## 2023-04-09 DIAGNOSIS — Z7982 Long term (current) use of aspirin: Secondary | ICD-10-CM

## 2023-04-09 DIAGNOSIS — Z79899 Other long term (current) drug therapy: Secondary | ICD-10-CM

## 2023-04-09 DIAGNOSIS — I69351 Hemiplegia and hemiparesis following cerebral infarction affecting right dominant side: Secondary | ICD-10-CM

## 2023-04-09 DIAGNOSIS — E11628 Type 2 diabetes mellitus with other skin complications: Secondary | ICD-10-CM | POA: Diagnosis present

## 2023-04-09 DIAGNOSIS — Z96653 Presence of artificial knee joint, bilateral: Secondary | ICD-10-CM | POA: Diagnosis present

## 2023-04-09 DIAGNOSIS — I739 Peripheral vascular disease, unspecified: Secondary | ICD-10-CM | POA: Diagnosis present

## 2023-04-09 DIAGNOSIS — Z803 Family history of malignant neoplasm of breast: Secondary | ICD-10-CM

## 2023-04-09 DIAGNOSIS — I2510 Atherosclerotic heart disease of native coronary artery without angina pectoris: Secondary | ICD-10-CM | POA: Diagnosis present

## 2023-04-09 DIAGNOSIS — M24562 Contracture, left knee: Secondary | ICD-10-CM | POA: Diagnosis present

## 2023-04-09 DIAGNOSIS — I779 Disorder of arteries and arterioles, unspecified: Secondary | ICD-10-CM | POA: Diagnosis present

## 2023-04-09 DIAGNOSIS — E1169 Type 2 diabetes mellitus with other specified complication: Secondary | ICD-10-CM | POA: Diagnosis not present

## 2023-04-09 DIAGNOSIS — Z7902 Long term (current) use of antithrombotics/antiplatelets: Secondary | ICD-10-CM

## 2023-04-09 DIAGNOSIS — L089 Local infection of the skin and subcutaneous tissue, unspecified: Secondary | ICD-10-CM | POA: Diagnosis present

## 2023-04-09 DIAGNOSIS — N390 Urinary tract infection, site not specified: Secondary | ICD-10-CM | POA: Diagnosis present

## 2023-04-09 DIAGNOSIS — R636 Underweight: Secondary | ICD-10-CM | POA: Diagnosis present

## 2023-04-09 DIAGNOSIS — Z7984 Long term (current) use of oral hypoglycemic drugs: Secondary | ICD-10-CM

## 2023-04-09 DIAGNOSIS — D649 Anemia, unspecified: Secondary | ICD-10-CM | POA: Diagnosis present

## 2023-04-09 DIAGNOSIS — Z888 Allergy status to other drugs, medicaments and biological substances status: Secondary | ICD-10-CM

## 2023-04-09 HISTORY — DX: Other symptoms and signs involving the genitourinary system: R39.89

## 2023-04-09 LAB — COMPREHENSIVE METABOLIC PANEL
ALT: 20 U/L (ref 0–44)
AST: 30 U/L (ref 15–41)
Albumin: 3.1 g/dL — ABNORMAL LOW (ref 3.5–5.0)
Alkaline Phosphatase: 63 U/L (ref 38–126)
Anion gap: 10 (ref 5–15)
BUN: 37 mg/dL — ABNORMAL HIGH (ref 8–23)
CO2: 22 mmol/L (ref 22–32)
Calcium: 8.6 mg/dL — ABNORMAL LOW (ref 8.9–10.3)
Chloride: 104 mmol/L (ref 98–111)
Creatinine, Ser: 0.74 mg/dL (ref 0.44–1.00)
GFR, Estimated: >60 mL/min (ref >60)
Glucose, Bld: 230 mg/dL — ABNORMAL HIGH (ref 70–99)
Potassium: 4.2 mmol/L (ref 3.5–5.1)
Sodium: 136 mmol/L (ref 135–145)
Total Bilirubin: 0.9 mg/dL (ref 0.0–1.2)
Total Protein: 6.5 g/dL (ref 6.5–8.1)

## 2023-04-09 LAB — CBC WITH DIFFERENTIAL/PLATELET
Abs Immature Granulocytes: 0.03 10*3/uL (ref 0.00–0.07)
Abs Immature Granulocytes: 0.03 10*3/uL (ref 0.00–0.07)
Basophils Absolute: 0 10*3/uL (ref 0.0–0.1)
Basophils Absolute: 0 10*3/uL (ref 0.0–0.1)
Basophils Relative: 0 %
Basophils Relative: 0 %
Eosinophils Absolute: 0.2 10*3/uL (ref 0.0–0.5)
Eosinophils Absolute: 0.2 10*3/uL (ref 0.0–0.5)
Eosinophils Relative: 3 %
Eosinophils Relative: 2 %
HCT: 28.4 % — ABNORMAL LOW (ref 36.0–46.0)
HCT: 27.3 % — ABNORMAL LOW (ref 36.0–46.0)
Hemoglobin: 8.6 g/dL — ABNORMAL LOW (ref 12.0–15.0)
Hemoglobin: 8.8 g/dL — ABNORMAL LOW (ref 12.0–15.0)
Immature Granulocytes: 0 %
Immature Granulocytes: 0 %
Lymphocytes Relative: 14 %
Lymphocytes Relative: 20 %
Lymphs Abs: 1 10*3/uL (ref 0.7–4.0)
Lymphs Abs: 1.6 10*3/uL (ref 0.7–4.0)
MCH: 31 pg (ref 26.0–34.0)
MCH: 31.3 pg (ref 26.0–34.0)
MCHC: 30.3 g/dL (ref 30.0–36.0)
MCHC: 32.2 g/dL (ref 30.0–36.0)
MCV: 102.5 fL — ABNORMAL HIGH (ref 80.0–100.0)
MCV: 97.2 fL (ref 80.0–100.0)
Monocytes Absolute: 0.2 10*3/uL (ref 0.1–1.0)
Monocytes Absolute: 0.5 10*3/uL (ref 0.1–1.0)
Monocytes Relative: 3 %
Monocytes Relative: 6 %
Neutro Abs: 6 10*3/uL (ref 1.7–7.7)
Neutro Abs: 5.7 10*3/uL (ref 1.7–7.7)
Neutrophils Relative %: 80 %
Neutrophils Relative %: 72 %
Platelets: 188 10*3/uL (ref 150–400)
Platelets: 175 10*3/uL (ref 150–400)
RBC: 2.77 MIL/uL — ABNORMAL LOW (ref 3.87–5.11)
RBC: 2.81 MIL/uL — ABNORMAL LOW (ref 3.87–5.11)
RDW: 13.4 % (ref 11.5–15.5)
RDW: 13.4 % (ref 11.5–15.5)
WBC: 7.5 10*3/uL (ref 4.0–10.5)
WBC: 7.9 10*3/uL (ref 4.0–10.5)
nRBC: 0 % (ref 0.0–0.2)
nRBC: 0 % (ref 0.0–0.2)

## 2023-04-09 LAB — URINALYSIS, ROUTINE W REFLEX MICROSCOPIC
Bilirubin Urine: NEGATIVE
Glucose, UA: 50 mg/dL — AB
Hgb urine dipstick: NEGATIVE
Ketones, ur: 5 mg/dL — AB
Nitrite: NEGATIVE
Protein, ur: NEGATIVE mg/dL
Specific Gravity, Urine: 1.016 (ref 1.005–1.030)
WBC, UA: >50 WBC/hpf (ref 0–5)
pH: 5 (ref 5.0–8.0)

## 2023-04-09 LAB — MRSA NEXT GEN BY PCR, NASAL: MRSA by PCR Next Gen: DETECTED — AB

## 2023-04-09 LAB — CBG MONITORING, ED
Glucose-Capillary: 220 mg/dL — ABNORMAL HIGH (ref 70–99)
Glucose-Capillary: 65 mg/dL — ABNORMAL LOW (ref 70–99)
Glucose-Capillary: 184 mg/dL — ABNORMAL HIGH (ref 70–99)
Glucose-Capillary: 152 mg/dL — ABNORMAL HIGH (ref 70–99)
Glucose-Capillary: 129 mg/dL — ABNORMAL HIGH (ref 70–99)

## 2023-04-09 LAB — GLUCOSE, CAPILLARY
Glucose-Capillary: 107 mg/dL — ABNORMAL HIGH (ref 70–99)
Glucose-Capillary: 122 mg/dL — ABNORMAL HIGH (ref 70–99)
Glucose-Capillary: 125 mg/dL — ABNORMAL HIGH (ref 70–99)
Glucose-Capillary: 134 mg/dL — ABNORMAL HIGH (ref 70–99)
Glucose-Capillary: 171 mg/dL — ABNORMAL HIGH (ref 70–99)
Glucose-Capillary: 71 mg/dL (ref 70–99)

## 2023-04-09 MED ORDER — DEXTROSE 10 % IV SOLN
INTRAVENOUS | Status: DC
  Filled 2023-04-09: qty 1000

## 2023-04-09 MED ORDER — ONDANSETRON HCL 4 MG/2ML IJ SOLN: 4.0000 mg | Freq: Four times a day (QID) | INTRAMUSCULAR | Status: DC | PRN

## 2023-04-09 MED ORDER — ONDANSETRON HCL 4 MG PO TABS: 4.0000 mg | ORAL_TABLET | Freq: Four times a day (QID) | ORAL | Status: DC | PRN

## 2023-04-09 MED ORDER — ENOXAPARIN SODIUM 40 MG/0.4ML IJ SOSY
40.0000 mg | PREFILLED_SYRINGE | INTRAMUSCULAR | Status: DC
  Administered 2023-04-09: 40 mg via SUBCUTANEOUS
  Filled 2023-04-09: qty 0.4

## 2023-04-09 MED ORDER — SODIUM CHLORIDE 0.9 % IV SOLN
1.0000 g | Freq: Once | INTRAVENOUS | Status: AC
  Administered 2023-04-09: 1 g via INTRAVENOUS
  Filled 2023-04-09: qty 10

## 2023-04-09 MED ORDER — OXYCODONE HCL 5 MG PO TABS
5.0000 mg | ORAL_TABLET | Freq: Three times a day (TID) | ORAL | Status: DC | PRN
  Administered 2023-04-09 – 2023-04-11 (×4): 5 mg via ORAL
  Filled 2023-04-09 (×4): qty 1

## 2023-04-09 MED ORDER — SODIUM CHLORIDE 0.9 % IV SOLN
2.0000 g | INTRAVENOUS | Status: DC
  Administered 2023-04-10: 2 g via INTRAVENOUS
  Filled 2023-04-09: qty 20

## 2023-04-09 MED ORDER — DEXTROSE 50 % IV SOLN
1.0000 | Freq: Once | INTRAVENOUS | Status: AC
  Administered 2023-04-09: 50 mL via INTRAVENOUS
  Filled 2023-04-09: qty 50

## 2023-04-09 NOTE — Assessment & Plan Note (Signed)
 BP stable Titrate home regimen

## 2023-04-09 NOTE — Inpatient Diabetes Management (Signed)
 Inpatient Diabetes Program Recommendations  AACE/ADA: New Consensus Statement on Inpatient Glycemic Control (2015)  Target Ranges:  Prepandial:   less than 140 mg/dL      Peak postprandial:   less than 180 mg/dL (1-2 hours)      Critically ill patients:  140 - 180 mg/dL   Lab Results  Component Value Date   GLUCAP 129 (H) 04/09/2023   HGBA1C 8.1 (H) 08/08/2022    Review of Glycemic Control  Latest Reference Range & Units 04/09/23 03:19 04/09/23 04:29 04/09/23 05:28 04/09/23 06:41  Glucose-Capillary 70 - 99 mg/dL 962 (H) 952 (H) 841 (H) 129 (H)  (H): Data is abnormally high Diabetes history: Type 2 DM Outpatient Diabetes medications: Amaryl 8 mg every day, Novolog 2-10 units TID Current orders for Inpatient glycemic control: none, CBGs Q1H  Inpatient Diabetes Program Recommendations:    Noted glucose trends. Secure chat sent to RN to ensure CBGs now that transferred to unit.   Would be guarded with use of Amaryl at discharge.   Thanks, Lujean Rave, MSN, RNC-OB Diabetes Coordinator [phone] (8a-5p)

## 2023-04-09 NOTE — ED Notes (Signed)
Pt given OJ to drink.  

## 2023-04-09 NOTE — Assessment & Plan Note (Addendum)
 Urinalysis indicative of infection in the setting of hypoglycemia No active dysuria or abdominal pain Confusion appears at baseline in the setting of dementia Status post IV Rocephin in the ER Will continue IV antibiotics for now Urine culture Monitor

## 2023-04-09 NOTE — Assessment & Plan Note (Signed)
 Baseline dementia with generalized confusion on presentation Suspect this may be patient's baseline Will monitor for now with noted transient hypoglycemia Follow-up

## 2023-04-09 NOTE — Assessment & Plan Note (Signed)
 Chronic right foot dry gangrene Stable Monitor

## 2023-04-09 NOTE — Progress Notes (Signed)
 Lab notified this RN of positive nasal MRSA check.  This RN notified Dr Alvester Morin with no new orders.

## 2023-04-09 NOTE — Assessment & Plan Note (Addendum)
 Blood sugars 30s to 200s on presentation in the setting of exogenous antidiabetic medication use Currently on D10 Blood sugars ranging in the 100s to 200s at present though somewhat downtrending Will continue D10 for now Hold offending agents Monitor

## 2023-04-09 NOTE — ED Triage Notes (Addendum)
 BIBA for hypoglycemia from [facility]. Per [facility] her glucose was 33. [facility] did not give any glucose PTA. EMS gave 250 mL of D10, glucose now 217.

## 2023-04-09 NOTE — ED Provider Notes (Signed)
 [facility] Provider Note    Event Date/Time   First MD Initiated Contact with Patient 04/09/23 [phone]     (approximate)   History   Hypoglycemia   HPI  [name] is a 89 y.o. female with history of hypertension, insulin-dependent diabetes, hyperlipidemia, CVA with right-sided weakness, peripheral vascular disease who presents to the emergency department with hypoglycemia.  Patient coming from [facility] with EMS.  Per EMS they report her blood sugar has been low today.  EMS reports it was 33 on their arrival.  They gave D10 and blood sugar improved to the 200s.  On recheck here to 65.  She denies any vomiting or diarrhea.  Has pain in her right foot from dry gangrene that is chronic.  Denies any other pain.  States she has been eating and drinking well.  Patient is on insulin and glimepiride.   History provided by patient, EMS.    Past Medical History:  Diagnosis Date   Carpal tunnel syndrome on both sides    Diabetes (HCC)    Diabetic polyneuropathy (HCC)    Diverticulitis    Hyperlipidemia    Hypertension    Osteoarthritis    Vaginal prolapse     Past Surgical History:  Procedure Laterality Date   AMPUTATION TOE Left 08/13/2022   Procedure: EXCISION OF 5TH RAY;  Surgeon: Gwyneth Revels, DPM;  Location: [facility] ORS;  Service: Orthopedics/Podiatry;  Laterality: Left;   CESAREAN SECTION     IRRIGATION AND DEBRIDEMENT FOOT Left 01/09/2022   Procedure: IRRIGATION AND DEBRIDEMENT FOOT WITH BONE BIOPSY;  Surgeon: Gwyneth Revels, DPM;  Location: [facility] ORS;  Service: Podiatry;  Laterality: Left;   LOWER EXTREMITY ANGIOGRAPHY Left 06/11/2021   Procedure: Lower Extremity Angiography;  Surgeon: Renford Dills, MD;  Location: [facility];  Service: Cardiovascular;  Laterality: Left;   LOWER EXTREMITY ANGIOGRAPHY Left 08/11/2022   Procedure: Lower Extremity Angiography;  Surgeon: Renford Dills, MD;  Location: [facility] INVASIVE CV LAB;  Service:  Cardiovascular;  Laterality: Left;   pessary     REPLACEMENT TOTAL KNEE BILATERAL     VENTRAL HERNIA REPAIR     VENTRAL HERNIA REPAIR N/A 01/13/2016   Procedure: HERNIA REPAIR VENTRAL ADULT WITH SMALL BOWEL RESECTION, LYSIS OF ADHESIONS;  Surgeon: Gladis Riffle, MD;  Location: [facility] ORS;  Service: General;  Laterality: N/A;    MEDICATIONS:  Prior to Admission medications   Medication Sig Start Date End Date Taking? Authorizing Provider  acetaminophen (TYLENOL) 325 MG tablet Take 2 tablets (650 mg total) by mouth every 6 (six) hours as needed for mild pain, moderate pain, fever or headache. 01/25/16   Loflin, Laney Potash, MD  aluminum-magnesium hydroxide-simethicone (MAALOX) 200-200-20 MG/5ML SUSP Take 30 mLs by mouth 4 (four) times daily -  before meals and at bedtime.    [provider]  Amino Acids-Protein Hydrolys (FEEDING SUPPLEMENT, PRO-STAT SUGAR FREE 64,) LIQD Take 30 mLs by mouth in the morning and at bedtime.    [provider]  ascorbic acid (VITAMIN C) 500 MG tablet Take 500 mg by mouth 2 (two) times daily.    [provider]  aspirin EC 81 MG tablet Take 81 mg by mouth daily. Swallow whole.    [provider]  atorvastatin (LIPITOR) 80 MG tablet Take 1 tablet (80 mg total) by mouth daily. Patient taking differently: Take 80 mg by mouth at bedtime. 09/26/19   Regalado, Jon Billings A, MD  cholecalciferol (VITAMIN D3) 25  MCG (1000 UNIT) tablet Take 1,000 Units by mouth daily.    [provider]  clopidogrel (PLAVIX) 75 MG tablet Take 1 tablet (75 mg total) by mouth daily. 09/26/19   Regalado, Belkys A, MD  cyanocobalamin (VITAMIN B12) 1000 MCG tablet Take 1,000 mcg by mouth daily.    [provider]  ferrous sulfate 325 (65 FE) MG tablet Take 325 mg by mouth every other day.    [provider]  glimepiride (AMARYL) 4 MG tablet Take 4 mg by mouth daily. 09/05/22   [provider]  Glucagon, rDNA, (GLUCAGON EMERGENCY) 1  MG KIT Inject 1 mg into the muscle once as needed (low blood sugar). 08/26/22   [provider]  insulin aspart (NOVOLOG) 100 UNIT/ML injection Inject into the skin 3 (three) times daily before meals.    [provider]  insulin lispro (HUMALOG) 100 UNIT/ML KwikPen Inject into the skin. Patient not taking: Reported on 01/22/2023    [provider]  lisinopril (ZESTRIL) 10 MG tablet Take 10 mg by mouth daily. 08/01/22   [provider]  magnesium oxide (MAG-OX) 400 (240 Mg) MG tablet Take 400 mg by mouth daily.    [provider]  Multiple Vitamins-Minerals (MULTIVITAMIN WITH MINERALS) tablet Take 1 tablet by mouth daily.    [provider]  oxycodone-acetaminophen (LYNOX) 5-300 MG tablet Take 1 tablet by mouth every 4 (four) hours as needed for pain.    [provider]  Vitamin D, Ergocalciferol, (DRISDOL) 1.25 MG (50000 UNIT) CAPS capsule Take 50,000 Units by mouth every 7 (seven) days. thursday    [provider]    Physical Exam   Triage Vital Signs: ED Triage Vitals [04/09/23 0241]  Encounter Vitals Group     BP (!) 141/56     Systolic BP Percentile      Diastolic BP Percentile      Pulse Rate 86     Resp 16     Temp 97.7 F (36.5 C)     Temp Source Oral     SpO2 96 %     Weight      Height      Head Circumference      [facility]      Pain Score      Pain Loc      Pain Education      Exclude from Growth Chart     Most recent vital signs: Vitals:   04/09/23 0600 04/09/23 0630  BP: (!) 115/46 (!) 118/58  Pulse: 80 84  Resp: 13 13  Temp:  97.9 F (36.6 C)  SpO2: 99% 99%    CONSTITUTIONAL: Alert, responds appropriately to questions intermittently but also appears to be intermittently confused.  Elderly, thin HEAD: Normocephalic, atraumatic EYES: Conjunctivae clear, pupils appear equal, sclera nonicteric ENT: normal nose; moist mucous membranes NECK: Supple, normal ROM CARD: RRR; S1 and S2  appreciated RESP: Normal chest excursion without splinting or tachypnea; breath sounds clear and equal bilaterally; no wheezes, no rhonchi, no rales, no hypoxia or respiratory distress, speaking full sentences ABD/GI: Non-distended; soft, non-tender, no rebound, no guarding, no peritoneal signs BACK: The back appears normal EXT: Normal ROM in all joints; no deformity noted, no edema, extremities warm and well-perfused, dry gangrene noted to the left great toe without drainage or bleeding SKIN: Normal color for age and race; warm; no rash on exposed skin NEURO: Chronic left sided weakness, normal speech, no facial asymmetry PSYCH: The patient's mood and  manner are appropriate.   ED Results / Procedures / Treatments   LABS: (all labs ordered are listed, but only abnormal results are displayed) Labs Reviewed  CBC WITH DIFFERENTIAL/PLATELET - Abnormal; Notable for the following components:      Result Value   RBC 2.77 (*)    Hemoglobin 8.6 (*)    HCT 28.4 (*)    MCV 102.5 (*)    All other components within normal limits  COMPREHENSIVE METABOLIC PANEL - Abnormal; Notable for the following components:   Glucose, Bld 230 (*)    BUN 37 (*)    Calcium 8.6 (*)    Albumin 3.1 (*)    All other components within normal limits  URINALYSIS, ROUTINE W REFLEX MICROSCOPIC - Abnormal; Notable for the following components:   Color, Urine YELLOW (*)    APPearance CLOUDY (*)    Glucose, UA 50 (*)    Ketones, ur 5 (*)    Leukocytes,Ua LARGE (*)    Bacteria, UA MANY (*)    All other components within normal limits  CBG MONITORING, ED - Abnormal; Notable for the following components:   Glucose-Capillary 65 (*)    All other components within normal limits  CBG MONITORING, ED - Abnormal; Notable for the following components:   Glucose-Capillary 220 (*)    All other components within normal limits  CBG MONITORING, ED - Abnormal; Notable for the following components:   Glucose-Capillary 184 (*)    All  other components within normal limits  CBG MONITORING, ED - Abnormal; Notable for the following components:   Glucose-Capillary 152 (*)    All other components within normal limits  CBG MONITORING, ED - Abnormal; Notable for the following components:   Glucose-Capillary 129 (*)    All other components within normal limits  URINE CULTURE     EKG:   RADIOLOGY: My personal review and interpretation of imaging:    I have personally reviewed all radiology reports.   No results found.   PROCEDURES:  Critical Care performed: Yes, see critical care procedure note(s)   CRITICAL CARE Performed by: Rochele Raring   Total critical care time: 40 minutes  Critical care time was exclusive of separately billable procedures and treating other patients.  Critical care was necessary to treat or prevent imminent or life-threatening deterioration.  Critical care was time spent personally by me on the following activities: development of treatment plan with patient and/or surrogate as well as nursing, discussions with consultants, evaluation of patient's response to treatment, examination of patient, obtaining history from patient or surrogate, ordering and performing treatments and interventions, ordering and review of laboratory studies, ordering and review of radiographic studies, pulse oximetry and re-evaluation of patient's condition.   Marland Kitchen1-3 Lead EKG Interpretation  Performed by: Bradee Common, Layla Maw, DO Authorized by: Saba Neuman, Layla Maw, DO     Interpretation: normal     ECG rate:  86   ECG rate assessment: normal     Rhythm: sinus rhythm     Ectopy: none     Conduction: normal       IMPRESSION / MDM / ASSESSMENT AND PLAN / ED COURSE  I reviewed the triage vital signs and the nursing notes.    Patient here for hypoglycemia.  The patient is on the cardiac monitor to evaluate for evidence of arrhythmia and/or significant heart rate changes.   DIFFERENTIAL DIAGNOSIS (includes but  not limited to):   Hypoglycemia, medication overdose, renal failure, decreased oral intake, failure to thrive, UTI,  doubt sepsis   Patient's presentation is most consistent with acute presentation with potential threat to life or bodily function.   PLAN: Patient's blood sugar was 33 on EMS arrival to [facility].  Improved to the 200s after D10 infusion.  Now back down to 65.  Will give amp of D50 and encouraged her to eat and drink.  Will obtain labs, urine.   MEDICATIONS GIVEN IN ED: Medications  dextrose 10 % infusion (has no administration in time range)  dextrose 50 % solution 50 mL (50 mLs Intravenous Given 04/09/23 0246)  cefTRIAXone (ROCEPHIN) 1 g in sodium chloride 0.9 % 100 mL IVPB (0 g Intravenous Stopped 04/09/23 0529)     ED COURSE: 3:22 AM  Blood sugar back in the 200s.   4:35 AM  Pt's CBG in the 180s.  5:00 AM  Pt's urine appears infected.  Will add on culture and give Rocephin.  5:40 AM  CBG in 150s.  6:43 AM  CBG now 129.  Given blood glucose continues to drop despite oral intake, will start D10 infusion and discussed with hospitalist for admission for observation given she is on long-acting insulin and a sulfonylurea.   CONSULTS:  Consulted and discussed patient's case with hospitalist, Dr. Alvester Morin.  I have recommended admission and consulting physician agrees and will place admission orders.  Patient (and family if present) agree with this plan.   I reviewed all nursing notes, vitals, pertinent previous records.  All labs, EKGs, imaging ordered have been independently reviewed and interpreted by myself.    OUTSIDE RECORDS REVIEWED: Reviewed previous vascular surgery note.  Dry gangrene noted to the left great toe in December 2024.       FINAL CLINICAL IMPRESSION(S) / ED DIAGNOSES   Final diagnoses:  Hypoglycemia  Acute UTI     Rx / DC Orders   ED Discharge Orders     None        Note:  This document was prepared using Dragon voice  recognition software and may include unintentional dictation errors.   Colbi Schiltz, Layla Maw, DO 04/09/23 [phone]

## 2023-04-09 NOTE — H&P (Addendum)
 History and Physical    Patient: [name]:[id number] DOB: 1933/09/27 DOA: 04/09/2023 DOS: the patient was seen and examined on 04/09/2023 PCP: Gracelyn Nurse, MD  Patient coming from: Home  Chief Complaint:  Chief Complaint  Patient presents with   Hypoglycemia   HPI: [name] is a 89 y.o. female with medical history significant of dementia, type 2 diabetes, diabetic polyneuropathy, essential hypertension, dyslipidemia, peripheral arterial disease presenting with hypoglycemia suspected UTI.  Limited history in the setting of dementia.  Patient noted to be resident of [facility] facility.  Per report patient with noted blood sugars in the 30s.  No reported recent medication/antidiabetic regimen change.  No fevers or chills.  No reported by patient's oral oral fluid intake.  At present, patient denies any chest pain, shortness of breath abdominal pain dysuria or back pain.  No reported falls or head trauma.  EMS subsequently called because of low blood sugar.  No glucose given at facility.  EMS provided 250 mL of D10 with glucose improving into the 200s Presented to the ER afebrile, hemodynamically stable.  Satting well on room air.  Blood sugar range 60s to 200s in the ER.  Started on D10 drip.  Creatinine 0.74.  CBC pending.  Urinalysis leukocyte positive.  Started on IV Rocephin empirically in the ER. Review of Systems: As mentioned in the history of present illness. All other systems reviewed and are negative. Past Medical History:  Diagnosis Date   Carpal tunnel syndrome on both sides    Diabetes (HCC)    Diabetic polyneuropathy (HCC)    Diverticulitis    Hyperlipidemia    Hypertension    Osteoarthritis    Vaginal prolapse    Past Surgical History:  Procedure Laterality Date   AMPUTATION TOE Left 08/13/2022   Procedure: EXCISION OF 5TH RAY;  Surgeon: Gwyneth Revels, DPM;  Location: [facility] ORS;  Service: Orthopedics/Podiatry;  Laterality: Left;   CESAREAN  SECTION     IRRIGATION AND DEBRIDEMENT FOOT Left 01/09/2022   Procedure: IRRIGATION AND DEBRIDEMENT FOOT WITH BONE BIOPSY;  Surgeon: Gwyneth Revels, DPM;  Location: [facility] OR[facility];  Service: Podiatry;  Laterality: Left;   LOWER EXTREMITY ANGIOGRAPHY Left 06/11/2021   Procedure: Lower Extremity Angiography;  Surgeon: Renford Dills, MD;  Location: [facility];  Service: Cardiovascular;  Laterality: Left;   LOWER EXTREMITY ANGIOGRAPHY Left 08/11/2022   Procedure: Lower Extremity Angiography;  Surgeon: Renford Dills, MD;  Location: [facility];  Service: Cardiovascular;  Laterality: Left;   pessary     REPLACEMENT TOTAL KNEE BILATERAL     VENTRAL HERNIA REPAIR     VENTRAL HERNIA REPAIR N/A 01/13/2016   Procedure: HERNIA REPAIR VENTRAL ADULT WITH SMALL BOWEL RESECTION, LYSIS OF ADHESIONS;  Surgeon: Gladis Riffle, MD;  Location: [facility] ORS;  Service: General;  Laterality: N/A;   Social History:  reports that she has never smoked. She has never used smokeless tobacco. She reports that she does not drink alcohol and does not use drugs.  Allergies  Allergen Reactions   Metformin And Related Nausea And Vomiting    Family History  Problem Relation Age of Onset   Breast cancer Mother 33   Breast cancer Maternal Aunt     Prior to Admission medications   Medication Sig Start Date End Date Taking? Authorizing Provider  acetaminophen (TYLENOL) 325 MG tablet Take 2 tablets (650 mg total) by mouth every 6 (six) hours as needed for mild pain, moderate pain,  fever or headache. 01/25/16  Yes Loflin, Laney Potash, MD  Amino Acids-Protein Hydrolys (FEEDING SUPPLEMENT, PRO-STAT SUGAR FREE 64,) LIQD Take 30 mLs by mouth in the morning and at bedtime.   Yes [provider]  ascorbic acid (VITAMIN C) 500 MG tablet Take 500 mg by mouth 2 (two) times daily.   Yes [provider]  aspirin EC 81 MG tablet Take 81 mg by mouth daily. Swallow whole.   Yes [provider]   atorvastatin (LIPITOR) 80 MG tablet Take 1 tablet (80 mg total) by mouth daily. Patient taking differently: Take 80 mg by mouth at bedtime. 09/26/19  Yes Regalado, Belkys A, MD  clopidogrel (PLAVIX) 75 MG tablet Take 1 tablet (75 mg total) by mouth daily. 09/26/19  Yes Regalado, Belkys A, MD  cyanocobalamin (VITAMIN B12) 1000 MCG tablet Take 1,000 mcg by mouth daily.   Yes [provider]  ferrous sulfate 325 (65 FE) MG tablet Take 325 mg by mouth every other day.   Yes [provider]  glimepiride (AMARYL) 4 MG tablet Take 8 mg by mouth daily. 09/05/22  Yes [provider]  Glucagon, rDNA, (GLUCAGON EMERGENCY) 1 MG KIT Inject 1 mg into the muscle once as needed (low blood sugar). 08/26/22  Yes [provider]  hydrALAZINE (APRESOLINE) 25 MG tablet Take 25 mg by mouth daily as needed. 03/04/23  Yes [provider]  linezolid (ZYVOX) 600 MG tablet Take 600 mg by mouth 2 (two) times daily. 03/30/23  Yes [provider]  lisinopril (ZESTRIL) 20 MG tablet Take 20 mg by mouth daily. 08/01/22  Yes [provider]  magnesium oxide (MAG-OX) 400 (240 Mg) MG tablet Take 400 mg by mouth daily.   Yes [provider]  Multiple Vitamins-Minerals (MULTIVITAMIN WITH MINERALS) tablet Take 1 tablet by mouth daily.   Yes [provider]  naloxone (NARCAN) nasal spray 4 mg/0.1 mL Place 1 spray into the nose once.   Yes [provider]  oxyCODONE (OXY IR/ROXICODONE) 5 MG immediate release tablet Take 5 mg by mouth 3 (three) times daily as needed. 03/23/23  Yes [provider]  polyethylene glycol (MIRALAX / GLYCOLAX) 17 g packet Take 17 g by mouth daily.   Yes [provider]  Vitamin D, Ergocalciferol, (DRISDOL) 1.25 MG (50000 UNIT) CAPS capsule Take 50,000 Units by mouth every 7 (seven) days. thursday   Yes [provider]  aluminum-magnesium hydroxide-simethicone (MAALOX) 200-200-20 MG/5ML SUSP Take 30 mLs by  mouth 4 (four) times daily -  before meals and at bedtime.    [provider]  insulin aspart (NOVOLOG) 100 UNIT/ML injection Inject 2-10 Units into the skin 3 (three) times daily before meals. As directed depending on blood sugar    [provider]    Physical Exam: Vitals:   04/09/23 0630 04/09/23 0700 04/09/23 0730 04/09/23 0823  BP: (!) 118/58 (!) 139/59 (!) 133/53 134/63  Pulse: 84 91 87 94  Resp: 13 16 15 14   Temp: 97.9 F (36.6 C)   98.5 F (36.9 C)  TempSrc: Oral     SpO2: 99% 99% 99% 100%  Height:  5\' 4"  (1.626 m)     Physical Exam Constitutional:      Comments: underweight  HENT:     Head: Normocephalic and atraumatic.     Mouth/Throat:     Mouth: Mucous membranes are dry.  Eyes:     Pupils: Pupils are equal, round, and reactive to light.  Cardiovascular:  Rate and Rhythm: Normal rate and regular rhythm.  Pulmonary:     Effort: Pulmonary effort is normal.  Abdominal:     General: Bowel sounds are normal.  Musculoskeletal:        General: Normal range of motion.  Skin:    General: Skin is dry.  Neurological:     General: No focal deficit present.  Psychiatric:        Mood and Affect: Mood normal.     Data Reviewed:  There are no new results to review at this time.   Lab Results  Component Value Date   WBC 7.5 04/09/2023   HGB 8.6 (L) 04/09/2023   HCT 28.4 (L) 04/09/2023   MCV 102.5 (H) 04/09/2023   PLT 188 04/09/2023   Last metabolic panel Lab Results  Component Value Date   GLUCOSE 230 (H) 04/09/2023   NA 136 04/09/2023   K 4.2 04/09/2023   CL 104 04/09/2023   CO2 22 04/09/2023   BUN 37 (H) 04/09/2023   CREATININE 0.74 04/09/2023   GFRNONAA >60 04/09/2023   CALCIUM 8.6 (L) 04/09/2023   PHOS 2.4 (L) 01/07/2022   PROT 6.5 04/09/2023   ALBUMIN 3.1 (L) 04/09/2023   LABGLOB 3.2 08/08/2022   AGRATIO 0.8 08/08/2022   BILITOT 0.9 04/09/2023   ALKPHOS 63 04/09/2023   AST 30 04/09/2023   ALT 20 04/09/2023   ANIONGAP 10  04/09/2023    Assessment and Plan: * Hypoglycemia Blood sugars 30s to 200s on presentation in the setting of exogenous antidiabetic medication use Currently on D10 Blood sugars ranging in the 100s to 200s at present though somewhat downtrending Will continue D10 for now Hold offending agents Monitor  Dementia (HCC) Baseline dementia with generalized confusion on presentation Suspect this may be patient's baseline Will monitor for now with noted transient hypoglycemia Follow-up  Suspected UTI Urinalysis indicative of infection in the setting of hypoglycemia No active dysuria or abdominal pain Confusion appears at baseline in the setting of dementia Status post IV Rocephin in the ER Will continue IV antibiotics for now Urine culture Monitor   Diabetic foot infection (HCC) Chronic right foot dry gangrene Stable Monitor  Essential hypertension BP stable Titrate home regimen      Advance Care Planning:   Code Status: Limited: Do not attempt resuscitation (DNR) -DNR-LIMITED -Do Not Intubate/DNI    Consults: None   Family Communication: No family at the bedside   Severity of Illness: The appropriate patient status for this patient is OBSERVATION. Observation status is judged to be reasonable and necessary in order to provide the required intensity of service to ensure the patient's safety. The patient's presenting symptoms, physical exam findings, and initial radiographic and laboratory data in the context of their medical condition is felt to place them at decreased risk for further clinical deterioration. Furthermore, it is anticipated that the patient will be medically stable for discharge from the hospital within 2 midnights of admission.   Author: Floydene Flock, MD 04/09/2023 9:02 AM  For on call review [email or web address].

## 2023-04-10 ENCOUNTER — Inpatient Hospital Stay: Payer: Medicare HMO

## 2023-04-10 DIAGNOSIS — E162 Hypoglycemia, unspecified: Secondary | ICD-10-CM | POA: Diagnosis present

## 2023-04-10 DIAGNOSIS — Z7982 Long term (current) use of aspirin: Secondary | ICD-10-CM | POA: Diagnosis not present

## 2023-04-10 DIAGNOSIS — Z7902 Long term (current) use of antithrombotics/antiplatelets: Secondary | ICD-10-CM | POA: Diagnosis not present

## 2023-04-10 DIAGNOSIS — Z794 Long term (current) use of insulin: Secondary | ICD-10-CM | POA: Diagnosis not present

## 2023-04-10 DIAGNOSIS — Z79899 Other long term (current) drug therapy: Secondary | ICD-10-CM | POA: Diagnosis not present

## 2023-04-10 DIAGNOSIS — R636 Underweight: Secondary | ICD-10-CM | POA: Diagnosis present

## 2023-04-10 DIAGNOSIS — E785 Hyperlipidemia, unspecified: Secondary | ICD-10-CM | POA: Diagnosis present

## 2023-04-10 DIAGNOSIS — L089 Local infection of the skin and subcutaneous tissue, unspecified: Secondary | ICD-10-CM | POA: Diagnosis present

## 2023-04-10 DIAGNOSIS — Z7984 Long term (current) use of oral hypoglycemic drugs: Secondary | ICD-10-CM | POA: Diagnosis not present

## 2023-04-10 DIAGNOSIS — I779 Disorder of arteries and arterioles, unspecified: Secondary | ICD-10-CM | POA: Diagnosis present

## 2023-04-10 DIAGNOSIS — Z96653 Presence of artificial knee joint, bilateral: Secondary | ICD-10-CM | POA: Diagnosis present

## 2023-04-10 DIAGNOSIS — I2510 Atherosclerotic heart disease of native coronary artery without angina pectoris: Secondary | ICD-10-CM | POA: Diagnosis present

## 2023-04-10 DIAGNOSIS — D649 Anemia, unspecified: Secondary | ICD-10-CM | POA: Diagnosis present

## 2023-04-10 DIAGNOSIS — N390 Urinary tract infection, site not specified: Secondary | ICD-10-CM | POA: Diagnosis present

## 2023-04-10 DIAGNOSIS — T383X1A Poisoning by insulin and oral hypoglycemic [antidiabetic] drugs, accidental (unintentional), initial encounter: Secondary | ICD-10-CM | POA: Diagnosis present

## 2023-04-10 DIAGNOSIS — I69351 Hemiplegia and hemiparesis following cerebral infarction affecting right dominant side: Secondary | ICD-10-CM | POA: Diagnosis not present

## 2023-04-10 DIAGNOSIS — E11649 Type 2 diabetes mellitus with hypoglycemia without coma: Secondary | ICD-10-CM | POA: Diagnosis present

## 2023-04-10 DIAGNOSIS — E11628 Type 2 diabetes mellitus with other skin complications: Secondary | ICD-10-CM | POA: Diagnosis present

## 2023-04-10 DIAGNOSIS — E1152 Type 2 diabetes mellitus with diabetic peripheral angiopathy with gangrene: Secondary | ICD-10-CM | POA: Diagnosis present

## 2023-04-10 DIAGNOSIS — Z89422 Acquired absence of other left toe(s): Secondary | ICD-10-CM | POA: Diagnosis not present

## 2023-04-10 DIAGNOSIS — F0390 Unspecified dementia without behavioral disturbance: Secondary | ICD-10-CM | POA: Diagnosis present

## 2023-04-10 DIAGNOSIS — E1142 Type 2 diabetes mellitus with diabetic polyneuropathy: Secondary | ICD-10-CM | POA: Diagnosis present

## 2023-04-10 DIAGNOSIS — M24562 Contracture, left knee: Secondary | ICD-10-CM | POA: Diagnosis present

## 2023-04-10 DIAGNOSIS — Z66 Do not resuscitate: Secondary | ICD-10-CM | POA: Diagnosis present

## 2023-04-10 DIAGNOSIS — I10 Essential (primary) hypertension: Secondary | ICD-10-CM | POA: Diagnosis present

## 2023-04-10 LAB — CBC
HCT: 23.5 % — ABNORMAL LOW (ref 36.0–46.0)
Hemoglobin: 7.7 g/dL — ABNORMAL LOW (ref 12.0–15.0)
MCH: 31.3 pg (ref 26.0–34.0)
MCHC: 32.8 g/dL (ref 30.0–36.0)
MCV: 95.5 fL (ref 80.0–100.0)
Platelets: 152 10*3/uL (ref 150–400)
RBC: 2.46 MIL/uL — ABNORMAL LOW (ref 3.87–5.11)
RDW: 13.2 % (ref 11.5–15.5)
WBC: 5.8 10*3/uL (ref 4.0–10.5)
nRBC: 0 % (ref 0.0–0.2)

## 2023-04-10 LAB — GLUCOSE, CAPILLARY
Glucose-Capillary: 207 mg/dL — ABNORMAL HIGH (ref 70–99)
Glucose-Capillary: 125 mg/dL — ABNORMAL HIGH (ref 70–99)
Glucose-Capillary: 157 mg/dL — ABNORMAL HIGH (ref 70–99)
Glucose-Capillary: 36 mg/dL — CL (ref 70–99)
Glucose-Capillary: 107 mg/dL — ABNORMAL HIGH (ref 70–99)
Glucose-Capillary: 67 mg/dL — ABNORMAL LOW (ref 70–99)
Glucose-Capillary: 162 mg/dL — ABNORMAL HIGH (ref 70–99)

## 2023-04-10 LAB — COMPREHENSIVE METABOLIC PANEL
ALT: 19 U/L (ref 0–44)
AST: 24 U/L (ref 15–41)
Albumin: 2.7 g/dL — ABNORMAL LOW (ref 3.5–5.0)
Alkaline Phosphatase: 55 U/L (ref 38–126)
Anion gap: 4 — ABNORMAL LOW (ref 5–15)
BUN: 21 mg/dL (ref 8–23)
CO2: 23 mmol/L (ref 22–32)
Calcium: 8.3 mg/dL — ABNORMAL LOW (ref 8.9–10.3)
Chloride: 103 mmol/L (ref 98–111)
Creatinine, Ser: 0.49 mg/dL (ref 0.44–1.00)
GFR, Estimated: >60 mL/min (ref >60)
Glucose, Bld: 215 mg/dL — ABNORMAL HIGH (ref 70–99)
Potassium: 4.1 mmol/L (ref 3.5–5.1)
Sodium: 130 mmol/L — ABNORMAL LOW (ref 135–145)
Total Bilirubin: 0.8 mg/dL (ref 0.0–1.2)
Total Protein: 5.6 g/dL — ABNORMAL LOW (ref 6.5–8.1)

## 2023-04-10 LAB — IRON AND TIBC
Iron: 184 ug/dL — ABNORMAL HIGH (ref 28–170)
Saturation Ratios: 87 % — ABNORMAL HIGH (ref 10.4–31.8)
TIBC: 211 ug/dL — ABNORMAL LOW (ref 250–450)
UIBC: 27 ug/dL

## 2023-04-10 LAB — HEMOGLOBIN A1C
Hgb A1c MFr Bld: 6.2 % — ABNORMAL HIGH (ref 4.8–5.6)
Mean Plasma Glucose: 131.24 mg/dL

## 2023-04-10 LAB — FOLATE: Folate: 19.1 ng/mL (ref >5.9)

## 2023-04-10 LAB — VITAMIN B12: Vitamin B-12: 767 pg/mL (ref 180–914)

## 2023-04-10 MED ORDER — INSULIN ASPART 100 UNIT/ML IJ SOLN
0.0000 [IU] | Freq: Three times a day (TID) | INTRAMUSCULAR | Status: DC
  Administered 2023-04-10: 2 [IU] via SUBCUTANEOUS
  Administered 2023-04-11: 1 [IU] via SUBCUTANEOUS
  Filled 2023-04-10 (×2): qty 1

## 2023-04-10 MED ORDER — ASPIRIN 81 MG PO TBEC
81.0000 mg | DELAYED_RELEASE_TABLET | Freq: Every day | ORAL | Status: DC
  Administered 2023-04-10 – 2023-04-11 (×2): 81 mg via ORAL
  Filled 2023-04-10 (×2): qty 1

## 2023-04-10 MED ORDER — ACETAMINOPHEN 325 MG PO TABS
650.0000 mg | ORAL_TABLET | Freq: Four times a day (QID) | ORAL | Status: DC | PRN
  Administered 2023-04-10 – 2023-04-11 (×2): 650 mg via ORAL
  Filled 2023-04-10 (×2): qty 2

## 2023-04-10 MED ORDER — CLOPIDOGREL BISULFATE 75 MG PO TABS: 75.0000 mg | ORAL_TABLET | Freq: Every day | ORAL | Status: DC

## 2023-04-10 MED ORDER — GLUCAGON HCL RDNA (DIAGNOSTIC) 1 MG IJ SOLR: 1.0000 mg | Freq: Once | INTRAMUSCULAR | Status: DC | PRN

## 2023-04-10 MED ORDER — LISINOPRIL 20 MG PO TABS
20.0000 mg | ORAL_TABLET | Freq: Every day | ORAL | Status: DC
  Administered 2023-04-10 – 2023-04-11 (×2): 20 mg via ORAL
  Filled 2023-04-10 (×2): qty 1

## 2023-04-10 MED ORDER — ENOXAPARIN SODIUM 30 MG/0.3ML IJ SOSY
30.0000 mg | PREFILLED_SYRINGE | INTRAMUSCULAR | Status: DC
  Administered 2023-04-10: 30 mg via SUBCUTANEOUS
  Filled 2023-04-10: qty 0.3

## 2023-04-10 MED ORDER — ATORVASTATIN CALCIUM 20 MG PO TABS
80.0000 mg | ORAL_TABLET | Freq: Every day | ORAL | Status: DC
  Administered 2023-04-10: 80 mg via ORAL
  Filled 2023-04-10: qty 4

## 2023-04-10 MED ORDER — INSULIN ASPART 100 UNIT/ML IJ SOLN: 0.0000 [IU] | Freq: Every day | INTRAMUSCULAR | Status: DC

## 2023-04-10 NOTE — Progress Notes (Signed)
 Patient was transported to ultrasound at 1021 in stable condition.  Patient returned from ultrasound at 1140 in bed in stable condition.

## 2023-04-10 NOTE — Progress Notes (Signed)
 Hypoglycemic Event  CBG: 36  Treatment: 8 oz juice/soda  Symptoms: None  Follow-up CBG: Time:1753 CBG Result:67  Possible Reasons for Event: NPO ordered following mid-day insulin administration.     Comments/MD notified:Dr Wouk  Hypoglycemic Event  CBG: 67  Treatment: 4 oz juice/soda  Symptoms: None  Follow-up CBG: Time:1823 CBG Result:107  Possible Reasons for Event: Other: Patient NPO   Comments/MD notified:Dr Wouk    Taichi Repka A America Brown

## 2023-04-10 NOTE — Plan of Care (Signed)
  Problem: Education: Goal: Knowledge of General Education information will improve Description: Including pain rating scale, medication(s)/side effects and non-pharmacologic comfort measures Outcome: Progressing   Problem: Activity: Goal: Risk for activity intolerance will decrease Outcome: Progressing   Problem: Nutrition: Goal: Adequate nutrition will be maintained Outcome: Progressing   Problem: Coping: Goal: Level of anxiety will decrease Outcome: Progressing   Problem: Elimination: Goal: Will not experience complications related to bowel motility Outcome: Progressing   Problem: Pain Managment: Goal: General experience of comfort will improve and/or be controlled Outcome: Progressing   Problem: Skin Integrity: Goal: Risk for impaired skin integrity will decrease Outcome: Progressing   Problem: Coping: Goal: Ability to adjust to condition or change in health will improve Outcome: Progressing   Problem: Skin Integrity: Goal: Risk for impaired skin integrity will decrease Outcome: Progressing

## 2023-04-10 NOTE — Plan of Care (Signed)

## 2023-04-10 NOTE — Progress Notes (Signed)
 PROGRESS NOTE    [name]  ACZ:[id number] DOB: Aug 18, 1933 DOA: 04/09/2023 PCP: Gracelyn Nurse, MD     Brief Narrative:   From admission h and p  [name] is a 89 y.o. female with medical history significant of dementia, type 2 diabetes, diabetic polyneuropathy, essential hypertension, dyslipidemia, peripheral arterial disease presenting with hypoglycemia suspected UTI.  Limited history in the setting of dementia.  [facility].  Per report patient with noted blood sugars in the 30s.  No reported recent medication/antidiabetic regimen change.  No fevers or chills.  No reported by patient's oral oral fluid intake.  At present, patient denies any chest pain, shortness of breath abdominal pain dysuria or back pain.  No reported falls or head trauma.  EMS subsequently called because of low blood sugar.  No glucose given at facility.  EMS provided 250 mL of D10 with glucose improving into the 200s   Assessment & Plan:   Principal Problem:   Hypoglycemia Active Problems:   Type 2 diabetes mellitus with polyneuropathy (HCC)   Essential hypertension   Carotid artery disease (HCC)   CVA (cerebral vascular accident) (HCC)   PAD (peripheral artery disease) (HCC)   Diabetic foot infection (HCC)   Suspected UTI   Dementia (HCC)  # Hypoglycemia # T2DM Resolved. Appears to be due to high dose sulfanylurea use (glimepiride 8) coupled with insulin - stop glucose gtt - continue diet - SSI  # Dry gangrene of toe Plan on admission was to monitor. I think we should probably begin to address this problem - ABIs - podiatry consult - will check blood cultures but unfortunately ceftriaxone started on admission   # PAD # CAD - ABIs LEs as above - resume home statin, asa - hold plavix pending podiatry eval  # normocytic anemia Baseline hgb 8s, today 7s, no bleeding, possibly dilutional - check iron panel, b12, folate - monitor for  bleeding  # History CVA With left sided weakness - asa, statin as above - plavix on hold  # Abnormal UA Doesn't appear to be symptomatic - stop ceftriaxone and monitor  # HTN Bp mild elevation - resume home lisinopril  # Dementia No behavioral distirubance  # Debility Resides at snf   DVT prophylaxis: lovenox Code Status: dnr Family Communication: son updated telephonically 2/28 (has dss guardian)  Level of care: Med-Surg Status is: Observation    Consultants:  podiatry  Procedures: Pending?  Antimicrobials:  S/p ceftriaxone    Subjective: No complaints  Objective: Vitals:   04/09/23 1616 04/09/23 1956 04/10/23 0346 04/10/23 0757  BP: (!) 135/102 (!) 163/68 129/60 (!) 162/82  Pulse: 75 87 85 97  Resp: 16 18 16 20   Temp: 98 F (36.7 C) 98.4 F (36.9 C) 98.2 F (36.8 C) 98 F (36.7 C)  TempSrc:  Oral Oral   SpO2: 99% 99% 96%   Height:        Intake/Output Summary (Last 24 hours) at 04/10/2023 0856 Last data filed at 04/09/2023 2300 Gross per 24 hour  Intake 100 ml  Output --  Net 100 ml   There were no vitals filed for this visit.  Examination:  General exam: Appears calm and comfortable  Respiratory system: Clear to auscultation. Respiratory effort normal. Cardiovascular system: S1 & S2 heard, RRR. Soft systolic murmur Gastrointestinal system: Abdomen is nondistended, soft and nontender.  Central nervous system: Alert and oriented to self. Left sided contracture in hand, left  sided weakness Extremities: dry gangrene right first toe Skin:  Psychiatry: calm, confused    Data Reviewed: I have personally reviewed following labs and imaging studies  CBC: Recent Labs  Lab 04/09/23 0244 04/09/23 0915 04/10/23 0442  WBC 7.5 7.9 5.8  NEUTROABS 6.0 5.7  --   HGB 8.6* 8.8* 7.7*  HCT 28.4* 27.3* 23.5*  MCV 102.5* 97.2 95.5  PLT 188 175 152   Basic Metabolic Panel: Recent Labs  Lab 04/09/23 0244 04/10/23 0442  NA 136 130*  K 4.2  4.1  CL 104 103  CO2 22 23  GLUCOSE 230* 215*  BUN 37* 21  CREATININE 0.74 0.49  CALCIUM 8.6* 8.3*   GFR: CrCl cannot be calculated (Unknown ideal weight.). Liver Function Tests: Recent Labs  Lab 04/09/23 0244 04/10/23 0442  AST 30 24  ALT 20 19  ALKPHOS 63 55  BILITOT 0.9 0.8  PROT 6.5 5.6*  ALBUMIN 3.1* 2.7*   No results for input(s): "LIPASE", "AMYLASE" in the last 168 hours. No results for input(s): "AMMONIA" in the last 168 hours. Coagulation Profile: No results for input(s): "INR", "PROTIME" in the last 168 hours. Cardiac Enzymes: No results for input(s): "CKTOTAL", "CKMB", "CKMBINDEX", "TROPONINI" in the last 168 hours. BNP (last 3 results) No results for input(s): "PROBNP" in the last 8760 hours. HbA1C: No results for input(s): "HGBA1C" in the last 72 hours. CBG: Recent Labs  Lab 04/09/23 1832 04/09/23 1957 04/09/23 2356 04/10/23 0347 04/10/23 0828  GLUCAP 107* 122* 171* 207* 125*   Lipid Profile: No results for input(s): "CHOL", "HDL", "LDLCALC", "TRIG", "CHOLHDL", "LDLDIRECT" in the last 72 hours. Thyroid Function Tests: No results for input(s): "TSH", "T4TOTAL", "FREET4", "T3FREE", "THYROIDAB" in the last 72 hours. Anemia Panel: No results for input(s): "VITAMINB12", "FOLATE", "FERRITIN", "TIBC", "IRON", "RETICCTPCT" in the last 72 hours. Urine analysis:    Component Value Date/Time   COLORURINE YELLOW (A) 04/09/2023 0337   APPEARANCEUR CLOUDY (A) 04/09/2023 0337   LABSPEC 1.016 04/09/2023 0337   PHURINE 5.0 04/09/2023 0337   GLUCOSEU 50 (A) 04/09/2023 0337   HGBUR NEGATIVE 04/09/2023 0337   BILIRUBINUR NEGATIVE 04/09/2023 0337   BILIRUBINUR neg 08/08/2019 0922   KETONESUR 5 (A) 04/09/2023 0337   PROTEINUR NEGATIVE 04/09/2023 0337   UROBILINOGEN 0.2 08/08/2019 0922   NITRITE NEGATIVE 04/09/2023 0337   LEUKOCYTESUR LARGE (A) 04/09/2023 0337   Sepsis Labs: @LABRCNTIP (procalcitonin:4,lacticidven:4)  ) Recent Results (from the past 240  hours)  MRSA Next Gen by PCR, Nasal     Status: Abnormal   Collection Time: 04/09/23  7:42 AM   Specimen: Nasal Mucosa; Nasal Swab  Result Value Ref Range Status   MRSA by PCR Next Gen DETECTED (A) NOT DETECTED Final    Comment: RESULT CALLED TO, READ BACK BY AND VERIFIED WITH: Bjorn Pippin RN [id number] 04/09/23 HNM (NOTE) The GeneXpert MRSA Assay (FDA approved for NASAL specimens only), is one component of a comprehensive MRSA colonization surveillance program. It is not intended to diagnose MRSA infection nor to guide or monitor treatment for MRSA infections. Test performance is not FDA approved in patients less than 60 years old. Performed at [facility], [address], [place], [place] [address]          Radiology Studies: No results found.      Scheduled Meds:  aspirin EC  81 mg Oral Daily   atorvastatin  80 mg Oral QHS   enoxaparin (LOVENOX) injection  30 mg Subcutaneous Q24H   insulin aspart  0-5  Units Subcutaneous QHS   insulin aspart  0-9 Units Subcutaneous TID WC   lisinopril  20 mg Oral Daily   Continuous Infusions:     LOS: 1 day     Silvano Bilis, MD [facility]   If 7PM-7AM, please contact night-coverage [email or web address] Password [facility] 04/10/2023, 8:56 AM

## 2023-04-10 NOTE — Progress Notes (Signed)
 Mobility Specialist - Progress Note   04/10/23 1000  Mobility  Activity Transferred from bed to chair;Turned to right side;Turned to left side;Turned to back - supine  Level of Assistance Total care  Activity Response Tolerated well  Mobility visit 1 Mobility  Mobility Specialist Start Time (ACUTE ONLY) 0935  Mobility Specialist Stop Time (ACUTE ONLY) 1011  Mobility Specialist Time Calculation (min) (ACUTE ONLY) 36 min     Pt lying in bed upon arrival utilizing RA. Pt agreeable to activity. Reports pain on her bottom. Noted BM when rolling, assist for peri-care. Pt voices pain relief after peri-care. Pt achieved EOB sitting with totalA. Restricted ROM in LLE. Pt slide transfer to chair with total +2. Pt left in chair with alarm set, needs in reach.   10:27 - pt returned to bed for [place]  Filiberto Pinks Mobility Specialist 04/10/23, 10:58 AM

## 2023-04-10 NOTE — Progress Notes (Signed)
 PODIATRY / FOOT AND ANKLE SURGERY CONSULTATION NOTE  Requesting Physician: Dr. Ashok Pall  Reason for consult: left hallux gangrene   HPI: [name] is a 89 y.o. female who presented to [facility] due to hypoglycemia.  Patient also has stable gangrene of the left hallux so podiatry is consulted for further assessment.  She has mild history of peripheral vascular disease and does follow-up with Dr. Gilda Crease with [facility] and does see Dr. Ether Griffins in outpatient clinic.  Patient has history of dementia.  PMHx:  Past Medical History:  Diagnosis Date   Carpal tunnel syndrome on both sides    Diabetes (HCC)    Diabetic polyneuropathy (HCC)    Diverticulitis    Hyperlipidemia    Hypertension    Osteoarthritis    Vaginal prolapse     Surgical Hx:  Past Surgical History:  Procedure Laterality Date   AMPUTATION TOE Left 08/13/2022   Procedure: EXCISION OF 5TH RAY;  Surgeon: Gwyneth Revels, DPM;  Location: [facility] ORS;  Service: Orthopedics/Podiatry;  Laterality: Left;   CESAREAN SECTION     IRRIGATION AND DEBRIDEMENT FOOT Left 01/09/2022   Procedure: IRRIGATION AND DEBRIDEMENT FOOT WITH BONE BIOPSY;  Surgeon: Gwyneth Revels, DPM;  Location: [facility] ORS;  Service: Podiatry;  Laterality: Left;   LOWER EXTREMITY ANGIOGRAPHY Left 06/11/2021   Procedure: Lower Extremity Angiography;  Surgeon: Renford Dills, MD;  Location: [facility] INVASIVE CV LAB;  Service: Cardiovascular;  Laterality: Left;   LOWER EXTREMITY ANGIOGRAPHY Left 08/11/2022   Procedure: Lower Extremity Angiography;  Surgeon: Renford Dills, MD;  Location: [facility];  Service: Cardiovascular;  Laterality: Left;   pessary     REPLACEMENT TOTAL KNEE BILATERAL     VENTRAL HERNIA REPAIR     VENTRAL HERNIA REPAIR N/A 01/13/2016   Procedure: HERNIA REPAIR VENTRAL ADULT WITH SMALL BOWEL RESECTION, LYSIS OF ADHESIONS;  Surgeon: Gladis Riffle, MD;  Location: [facility] ORS;  Service: General;  Laterality: N/A;    FHx:  Family  History  Problem Relation Age of Onset   Breast cancer Mother 51   Breast cancer Maternal Aunt     Social History:  reports that she has never smoked. She has never used smokeless tobacco. She reports that she does not drink alcohol and does not use drugs.  Allergies:  Allergies  Allergen Reactions   Metformin And Related Nausea And Vomiting    Medications Prior to Admission  Medication Sig Dispense Refill   acetaminophen (TYLENOL) 325 MG tablet Take 2 tablets (650 mg total) by mouth every 6 (six) hours as needed for mild pain, moderate pain, fever or headache. 60 tablet 0   Amino Acids-Protein Hydrolys (FEEDING SUPPLEMENT, PRO-STAT SUGAR FREE 64,) LIQD Take 30 mLs by mouth in the morning and at bedtime.     ascorbic acid (VITAMIN C) 500 MG tablet Take 500 mg by mouth 2 (two) times daily.     aspirin EC 81 MG tablet Take 81 mg by mouth daily. Swallow whole.     atorvastatin (LIPITOR) 80 MG tablet Take 1 tablet (80 mg total) by mouth daily. (Patient taking differently: Take 80 mg by mouth at bedtime.) 30 tablet 0   clopidogrel (PLAVIX) 75 MG tablet Take 1 tablet (75 mg total) by mouth daily. 30 tablet 3   cyanocobalamin (VITAMIN B12) 1000 MCG tablet Take 1,000 mcg by mouth daily.     ferrous sulfate 325 (65 FE) MG tablet Take 325 mg by mouth every other day.  glimepiride (AMARYL) 4 MG tablet Take 8 mg by mouth daily.     Glucagon, rDNA, (GLUCAGON EMERGENCY) 1 MG KIT Inject 1 mg into the muscle once as needed (low blood sugar).     hydrALAZINE (APRESOLINE) 25 MG tablet Take 25 mg by mouth daily as needed.     insulin aspart (NOVOLOG) 100 UNIT/ML injection Inject 2-10 Units into the skin 3 (three) times daily before meals. As directed depending on blood sugar     linezolid (ZYVOX) 600 MG tablet Take 600 mg by mouth 2 (two) times daily.     lisinopril (ZESTRIL) 20 MG tablet Take 20 mg by mouth daily.     Magnesium Oxide -Mg Supplement 200 MG TABS Take 200 mg by mouth daily.     Multiple  Vitamins-Minerals (MULTIVITAMIN WITH MINERALS) tablet Take 1 tablet by mouth daily.     naloxone (NARCAN) nasal spray 4 mg/0.1 mL Place 1 spray into the nose once.     oxyCODONE (OXY IR/ROXICODONE) 5 MG immediate release tablet Take 5 mg by mouth 3 (three) times daily.     polyethylene glycol (MIRALAX / GLYCOLAX) 17 g packet Take 17 g by mouth daily.     Vitamin D, Ergocalciferol, (DRISDOL) 1.25 MG (50000 UNIT) CAPS capsule Take 50,000 Units by mouth every 7 (seven) days. thursday      Physical Exam: General: Alert and oriented.  No apparent distress.  Vascular: DP/PT pulses nonpalpable bilateral, capillary fill time appears to be absent to the left hallux and delayed to the left foot in general.  No hair growth present bilateral lower extremities.  Neuro: Light touch sensation appears to be absent to bilateral lower extremities.  Derm: Left hallux appears to have dry stable gangrene with no active drainage.  MSK: Mild pain on palpation of the left foot.  Contracture present to the left limb.  Results for orders placed or performed during the hospital encounter of 04/09/23 (from the past 48 hours)  CBG monitoring, ED     Status: Abnormal   Collection Time: 04/09/23  2:38 AM  Result Value Ref Range   Glucose-Capillary 65 (L) 70 - 99 mg/dL    Comment: Glucose reference range applies only to samples taken after fasting for at least 8 hours.  CBC with Differential/Platelet     Status: Abnormal   Collection Time: 04/09/23  2:44 AM  Result Value Ref Range   WBC 7.5 4.0 - 10.5 K/uL   RBC 2.77 (L) 3.87 - 5.11 MIL/uL   Hemoglobin 8.6 (L) 12.0 - 15.0 g/dL   HCT 16.1 (L) 09.6 - 04.5 %   MCV 102.5 (H) 80.0 - 100.0 fL   MCH 31.0 26.0 - 34.0 pg   MCHC 30.3 30.0 - 36.0 g/dL   RDW 40.9 81.1 - 91.4 %   Platelets 188 150 - 400 K/uL   nRBC 0.0 0.0 - 0.2 %   Neutrophils Relative % 80 %   Neutro Abs 6.0 1.7 - 7.7 K/uL   Lymphocytes Relative 14 %   Lymphs Abs 1.0 0.7 - 4.0 K/uL   Monocytes  Relative 3 %   Monocytes Absolute 0.2 0.1 - 1.0 K/uL   Eosinophils Relative 3 %   Eosinophils Absolute 0.2 0.0 - 0.5 K/uL   Basophils Relative 0 %   Basophils Absolute 0.0 0.0 - 0.1 K/uL   Immature Granulocytes 0 %   Abs Immature Granulocytes 0.03 0.00 - 0.07 K/uL    Comment: Performed at [facility], [address] [place], [place] [address]  Comprehensive metabolic panel     Status: Abnormal   Collection Time: 04/09/23  2:44 AM  Result Value Ref Range   Sodium 136 135 - 145 mmol/L   Potassium 4.2 3.5 - 5.1 mmol/L   Chloride 104 98 - 111 mmol/L   CO2 22 22 - 32 mmol/L   Glucose, Bld 230 (H) 70 - 99 mg/dL    Comment: Glucose reference range applies only to samples taken after fasting for at least 8 hours.   BUN 37 (H) 8 - 23 mg/dL   Creatinine, Ser 1.91 0.44 - 1.00 mg/dL   Calcium 8.6 (L) 8.9 - 10.3 mg/dL   Total Protein 6.5 6.5 - 8.1 g/dL   Albumin 3.1 (L) 3.5 - 5.0 g/dL   AST 30 15 - 41 U/L   ALT 20 0 - 44 U/L   Alkaline Phosphatase 63 38 - 126 U/L   Total Bilirubin 0.9 0.0 - 1.2 mg/dL   GFR, Estimated >47 >82 mL/min    Comment: (NOTE) Calculated using the CKD-EPI Creatinine Equation (2021)    Anion gap 10 5 - 15    Comment: Performed at [facility], [address] [place], [place] [address]  CBG monitoring, ED     Status: Abnormal   Collection Time: 04/09/23  3:19 AM  Result Value Ref Range   Glucose-Capillary 220 (H) 70 - 99 mg/dL    Comment: Glucose reference range applies only to samples taken after fasting for at least 8 hours.  Urinalysis, Routine w reflex microscopic -Urine, Clean Catch     Status: Abnormal   Collection Time: 04/09/23  3:37 AM  Result Value Ref Range   Color, Urine YELLOW (A) YELLOW   APPearance CLOUDY (A) CLEAR   Specific Gravity, Urine 1.016 1.005 - 1.030   pH 5.0 5.0 - 8.0   Glucose, UA 50 (A) NEGATIVE mg/dL   Hgb urine dipstick NEGATIVE NEGATIVE   Bilirubin Urine NEGATIVE NEGATIVE   Ketones, ur 5 (A) NEGATIVE mg/dL    Protein, ur NEGATIVE NEGATIVE mg/dL   Nitrite NEGATIVE NEGATIVE   Leukocytes,Ua LARGE (A) NEGATIVE   RBC / HPF 0-5 0 - 5 RBC/hpf   WBC, UA >50 0 - 5 WBC/hpf   Bacteria, UA MANY (A) NONE SEEN   Squamous Epithelial / HPF 0-5 0 - 5 /HPF   WBC Clumps PRESENT     Comment: Performed at [facility], [address] [place], [place] [address]  Urine Culture     Status: Abnormal (Preliminary result)   Collection Time: 04/09/23  3:37 AM   Specimen: Urine, Clean Catch  Result Value Ref Range   Specimen Description      URINE, CLEAN CATCH Performed at [facility], [address], [place], [place] [address]    Special Requests      NONE Performed at [facility], [address], [place], [place] [address]    Culture (A)     >=100,000 COLONIES/mL KLEBSIELLA PNEUMONIAE SUSCEPTIBILITIES TO FOLLOW Performed at [facility], [address] [place], [place] [address]    Report Status PENDING   CBG monitoring, ED     Status: Abnormal   Collection Time: 04/09/23  4:29 AM  Result Value Ref Range   Glucose-Capillary 184 (H) 70 - 99 mg/dL    Comment: Glucose reference range applies only to samples taken after fasting for at least 8 hours.  CBG monitoring, ED     Status:  Abnormal   Collection Time: 04/09/23  5:28 AM  Result Value Ref Range   Glucose-Capillary 152 (H) 70 - 99 mg/dL    Comment: Glucose reference range applies only to samples taken after fasting for at least 8 hours.  CBG monitoring, ED     Status: Abnormal   Collection Time: 04/09/23  6:41 AM  Result Value Ref Range   Glucose-Capillary 129 (H) 70 - 99 mg/dL    Comment: Glucose reference range applies only to samples taken after fasting for at least 8 hours.  MRSA Next Gen by PCR, Nasal     Status: Abnormal   Collection Time: 04/09/23  7:42 AM   Specimen: Nasal Mucosa; Nasal Swab  Result Value Ref Range   MRSA by PCR Next Gen DETECTED (A) NOT DETECTED    Comment: RESULT CALLED TO, READ  BACK BY AND VERIFIED WITH: Bjorn Pippin RN [id number] 04/09/23 HNM (NOTE) The GeneXpert MRSA Assay (FDA approved for NASAL specimens only), is one component of a comprehensive MRSA colonization surveillance program. It is not intended to diagnose MRSA infection nor to guide or monitor treatment for MRSA infections. Test performance is not FDA approved in patients less than 81 years old. Performed at [facility], [address] [place], [place] [address]   CBC with Differential/Platelet     Status: Abnormal   Collection Time: 04/09/23  9:15 AM  Result Value Ref Range   WBC 7.9 4.0 - 10.5 K/uL   RBC 2.81 (L) 3.87 - 5.11 MIL/uL   Hemoglobin 8.8 (L) 12.0 - 15.0 g/dL   HCT 60.4 (L) 54.0 - 98.1 %   MCV 97.2 80.0 - 100.0 fL   MCH 31.3 26.0 - 34.0 pg   MCHC 32.2 30.0 - 36.0 g/dL   RDW 19.1 47.8 - 29.5 %   Platelets 175 150 - 400 K/uL   nRBC 0.0 0.0 - 0.2 %   Neutrophils Relative % 72 %   Neutro Abs 5.7 1.7 - 7.7 K/uL   Lymphocytes Relative 20 %   Lymphs Abs 1.6 0.7 - 4.0 K/uL   Monocytes Relative 6 %   Monocytes Absolute 0.5 0.1 - 1.0 K/uL   Eosinophils Relative 2 %   Eosinophils Absolute 0.2 0.0 - 0.5 K/uL   Basophils Relative 0 %   Basophils Absolute 0.0 0.0 - 0.1 K/uL   Immature Granulocytes 0 %   Abs Immature Granulocytes 0.03 0.00 - 0.07 K/uL    Comment: Performed at [facility], [address] [place], [place] [address]  Glucose, capillary     Status: None   Collection Time: 04/09/23 10:21 AM  Result Value Ref Range   Glucose-Capillary 71 70 - 99 mg/dL    Comment: Glucose reference range applies only to samples taken after fasting for at least 8 hours.  Glucose, capillary     Status: Abnormal   Collection Time: 04/09/23 12:12 PM  Result Value Ref Range   Glucose-Capillary 125 (H) 70 - 99 mg/dL    Comment: Glucose reference range applies only to samples taken after fasting for at least 8 hours.  Glucose, capillary     Status: Abnormal   Collection Time:  04/09/23  4:09 PM  Result Value Ref Range   Glucose-Capillary 134 (H) 70 - 99 mg/dL    Comment: Glucose reference range applies only to samples taken after fasting for at least 8 hours.   Comment 1 Notify RN    Comment 2 Document in Chart   Glucose,  capillary     Status: Abnormal   Collection Time: 04/09/23  6:32 PM  Result Value Ref Range   Glucose-Capillary 107 (H) 70 - 99 mg/dL    Comment: Glucose reference range applies only to samples taken after fasting for at least 8 hours.   Comment 1 Notify RN    Comment 2 Document in Chart   Glucose, capillary     Status: Abnormal   Collection Time: 04/09/23  7:57 PM  Result Value Ref Range   Glucose-Capillary 122 (H) 70 - 99 mg/dL    Comment: Glucose reference range applies only to samples taken after fasting for at least 8 hours.  Glucose, capillary     Status: Abnormal   Collection Time: 04/09/23 11:56 PM  Result Value Ref Range   Glucose-Capillary 171 (H) 70 - 99 mg/dL    Comment: Glucose reference range applies only to samples taken after fasting for at least 8 hours.  Glucose, capillary     Status: Abnormal   Collection Time: 04/10/23  3:47 AM  Result Value Ref Range   Glucose-Capillary 207 (H) 70 - 99 mg/dL    Comment: Glucose reference range applies only to samples taken after fasting for at least 8 hours.  CBC     Status: Abnormal   Collection Time: 04/10/23  4:42 AM  Result Value Ref Range   WBC 5.8 4.0 - 10.5 K/uL   RBC 2.46 (L) 3.87 - 5.11 MIL/uL   Hemoglobin 7.7 (L) 12.0 - 15.0 g/dL   HCT 29.5 (L) 28.4 - 13.2 %   MCV 95.5 80.0 - 100.0 fL   MCH 31.3 26.0 - 34.0 pg   MCHC 32.8 30.0 - 36.0 g/dL   RDW 44.0 10.2 - 72.5 %   Platelets 152 150 - 400 K/uL   nRBC 0.0 0.0 - 0.2 %    Comment: Performed at [facility], [address] [place], [place] [address]  Comprehensive metabolic panel     Status: Abnormal   Collection Time: 04/10/23  4:42 AM  Result Value Ref Range   Sodium 130 (L) 135 - 145 mmol/L   Potassium  4.1 3.5 - 5.1 mmol/L   Chloride 103 98 - 111 mmol/L   CO2 23 22 - 32 mmol/L   Glucose, Bld 215 (H) 70 - 99 mg/dL    Comment: Glucose reference range applies only to samples taken after fasting for at least 8 hours.   BUN 21 8 - 23 mg/dL   Creatinine, Ser 0.34 0.44 - 1.00 mg/dL   Calcium 8.3 (L) 8.9 - 10.3 mg/dL   Total Protein 5.6 (L) 6.5 - 8.1 g/dL   Albumin 2.7 (L) 3.5 - 5.0 g/dL   AST 24 15 - 41 U/L   ALT 19 0 - 44 U/L   Alkaline Phosphatase 55 38 - 126 U/L   Total Bilirubin 0.8 0.0 - 1.2 mg/dL   GFR, Estimated >74 >25 mL/min    Comment: (NOTE) Calculated using the CKD-EPI Creatinine Equation (2021)    Anion gap 4 (L) 5 - 15    Comment: Performed at [facility], [address], [place], [place] [address]  Folate     Status: None   Collection Time: 04/10/23  4:42 AM  Result Value Ref Range   Folate 19.1 >5.9 ng/mL    Comment: Performed at [facility], [address] [place], [place] [address]  Iron and TIBC     Status: Abnormal   Collection Time: 04/10/23  4:42 AM  Result Value Ref Range   Iron 184 (H) 28 - 170 ug/dL   TIBC 161 (L) 096 - 045 ug/dL   Saturation Ratios 87 (H) 10.4 - 31.8 %   UIBC 27 ug/dL    Comment: Performed at [facility], [address] [place], [place] [address]  Glucose, capillary     Status: Abnormal   Collection Time: 04/10/23  8:28 AM  Result Value Ref Range   Glucose-Capillary 125 (H) 70 - 99 mg/dL    Comment: Glucose reference range applies only to samples taken after fasting for at least 8 hours.  Glucose, capillary     Status: Abnormal   Collection Time: 04/10/23 11:55 AM  Result Value Ref Range   Glucose-Capillary 157 (H) 70 - 99 mg/dL    Comment: Glucose reference range applies only to samples taken after fasting for at least 8 hours.   No results found.  Blood pressure (!) 162/82, pulse 97, temperature 98 F (36.7 C), resp. rate 20, height 5\' 4"  (1.626 m), SpO2 96%.  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 -Left hallux appears to be stable with dry gangrene present at this point.  Patient has had this now for a number of months but does not appear to be worsened.  Appears very stable. -Recommend Betadine painting of the toe daily and keeping the toe dry and clean. -Appreciate [facility] recommendations. -Unsure patient has flow to heal a partial ray amputation.  Would recommend conservative care so long as the toe is stable.  Podiatry team to sign off at this time.  Patient may follow-up in outpatient clinic within 1 to 2 weeks of discharge date for further evaluation of toe.  Rosetta Posner, DPM 04/10/2023, 2:00 PM

## 2023-04-10 NOTE — TOC Progression Note (Signed)
 Transition of Care [facility]) - Progression Note    Patient Details  Name: [name] MRN: [id number] Date of Birth: 1933/04/29  [facility] CM/SW Contact  Maxmilian Trostel [place], [place] Phone Number: 04/10/2023, 1:52 PM  Clinical Narrative:     CSW confirmed with [facility] that pt is LTC there; she can return tomorrow.   CSW left voicemail with Chrissy Cobb (legal Guardian) requesting return call.                        Social Determinants of Health (SDOH) Interventions SDOH Screenings   Food Insecurity: No Food Insecurity (04/09/2023)  Housing: Low Risk  (04/09/2023)  Transportation Needs: No Transportation Needs (04/09/2023)  Utilities: Not At Risk (04/09/2023)  Social Connections: Socially Isolated (04/09/2023)  Tobacco Use: Low Risk  (04/09/2023)    Readmission Risk Interventions     No data to display

## 2023-04-10 NOTE — Consult Note (Signed)
 Hospital Consult    Reason for Consult:  Left Lower Extremity Gangrene Requesting Physician:  Dr Rosetta Posner MRN #:  [id number]  History of Present Illness: This is a 89 y.o. female  with medical history significant of dementia, type 2 diabetes, diabetic polyneuropathy, essential hypertension, dyslipidemia, peripheral arterial disease presenting with hypoglycemia suspected UTI.  Limited history in the setting of dementia.  Patient noted to [facility].  Per report patient with noted blood sugars in the 30s.  No reported recent medication/antidiabetic regimen change. Ems was called and patient taken to the emergency room for hypoglycemia.   Upon workup patient was noted to be leukocyte positive on a urine analysis.  She was then started on IV Rocephin empirically.  Patient was also noted to have a left lower extremity gangrenous great toe.  Vascular surgery was consulted to evaluate.  Past Medical History:  Diagnosis Date   Carpal tunnel syndrome on both sides    Diabetes (HCC)    Diabetic polyneuropathy (HCC)    Diverticulitis    Hyperlipidemia    Hypertension    Osteoarthritis    Vaginal prolapse     Past Surgical History:  Procedure Laterality Date   AMPUTATION TOE Left 08/13/2022   Procedure: EXCISION OF 5TH RAY;  Surgeon: Gwyneth Revels, DPM;  Location: [facility] ORS;  Service: Orthopedics/Podiatry;  Laterality: Left;   CESAREAN SECTION     IRRIGATION AND DEBRIDEMENT FOOT Left 01/09/2022   Procedure: IRRIGATION AND DEBRIDEMENT FOOT WITH BONE BIOPSY;  Surgeon: Gwyneth Revels, DPM;  Location: [facility] OR[facility];  Service: Podiatry;  Laterality: Left;   LOWER EXTREMITY ANGIOGRAPHY Left 06/11/2021   Procedure: Lower Extremity Angiography;  Surgeon: Renford Dills, MD;  Location: [facility]ASIVE [facility];  Service: Cardiovascular;  Laterality: Left;   LOWER EXTREMITY ANGIOGRAPHY Left 08/11/2022   Procedure: Lower Extremity Angiography;  Surgeon: Renford Dills, MD;   Location: [facility] CV [facility];  Service: Cardiovascular;  Laterality: Left;   pessary     REPLACEMENT TOTAL KNEE BILATERAL     VENTRAL HERNIA REPAIR     VENTRAL HERNIA REPAIR N/A 01/13/2016   Procedure: HERNIA REPAIR VENTRAL ADULT WITH SMALL BOWEL RESECTION, LYSIS OF ADHESIONS;  Surgeon: Gladis Riffle, MD;  Location: [facility] OR[facility];  Service: General;  Laterality: N/A;    Allergies  Allergen Reactions   Metformin And Related Nausea And Vomiting    Prior to Admission medications   Medication Sig Start Date End Date Taking? Authorizing Provider  acetaminophen (TYLENOL) 325 MG tablet Take 2 tablets (650 mg total) by mouth every 6 (six) hours as needed for mild pain, moderate pain, fever or headache. 01/25/16  Yes Loflin, Laney Potash, MD  Amino Acids-Protein Hydrolys (FEEDING SUPPLEMENT, PRO-STAT SUGAR FREE 64,) LIQD Take 30 mLs by mouth in the morning and at bedtime.   Yes [provider]  ascorbic acid (VITAMIN C) 500 MG tablet Take 500 mg by mouth 2 (two) times daily.   Yes [provider]  aspirin EC 81 MG tablet Take 81 mg by mouth daily. Swallow whole.   Yes [provider]  atorvastatin (LIPITOR) 80 MG tablet Take 1 tablet (80 mg total) by mouth daily. Patient taking differently: Take 80 mg by mouth at bedtime. 09/26/19  Yes Regalado, Belkys A, MD  clopidogrel (PLAVIX) 75 MG tablet Take 1 tablet (75 mg total) by mouth daily. 09/26/19  Yes Regalado, Belkys A, MD  cyanocobalamin (VITAMIN B12) 1000 MCG tablet Take 1,000 mcg by mouth  daily.   Yes [provider]  ferrous sulfate 325 (65 FE) MG tablet Take 325 mg by mouth every other day.   Yes [provider]  glimepiride (AMARYL) 4 MG tablet Take 8 mg by mouth daily. 09/05/22  Yes [provider]  Glucagon, rDNA, (GLUCAGON EMERGENCY) 1 MG KIT Inject 1 mg into the muscle once as needed (low blood sugar). 08/26/22  Yes [provider]  hydrALAZINE (APRESOLINE) 25 MG tablet Take 25 mg  by mouth daily as needed. 03/04/23  Yes [provider]  insulin aspart (NOVOLOG) 100 UNIT/ML injection Inject 2-10 Units into the skin 3 (three) times daily before meals. As directed depending on blood sugar   Yes [provider]  linezolid (ZYVOX) 600 MG tablet Take 600 mg by mouth 2 (two) times daily. 03/30/23  Yes [provider]  lisinopril (ZESTRIL) 20 MG tablet Take 20 mg by mouth daily. 08/01/22  Yes [provider]  Magnesium Oxide -Mg Supplement 200 MG TABS Take 200 mg by mouth daily.   Yes [provider]  Multiple Vitamins-Minerals (MULTIVITAMIN WITH MINERALS) tablet Take 1 tablet by mouth daily.   Yes [provider]  naloxone (NARCAN) nasal spray 4 mg/0.1 mL Place 1 spray into the nose once.   Yes [provider]  oxyCODONE (OXY IR/ROXICODONE) 5 MG immediate release tablet Take 5 mg by mouth 3 (three) times daily. 03/23/23  Yes [provider]  polyethylene glycol (MIRALAX / GLYCOLAX) 17 g packet Take 17 g by mouth daily.   Yes [provider]  Vitamin D, Ergocalciferol, (DRISDOL) 1.25 MG (50000 UNIT) CAPS capsule Take 50,000 Units by mouth every 7 (seven) days. thursday   Yes [provider]    Social History   Socioeconomic History   Marital status: Widowed    Spouse name: Not on file   Number of children: Not on file   Years of education: Not on file   Highest education level: Not on file  Occupational History   Not on file  Tobacco Use   Smoking status: Never   Smokeless tobacco: Never  Vaping Use   Vaping status: Never Used  Substance and Sexual Activity   Alcohol use: No   Drug use: No   Sexual activity: Not Currently  Other Topics Concern   Not on file  Social History Narrative   Lives at home by herself. Ambulates with a cane.   Social Drivers of Corporate investment banker Strain: Not on file  Food Insecurity: No Food Insecurity (04/09/2023)   Hunger Vital Sign     Worried About Running Out of Food in the Last Year: Never true    Ran Out of Food in the Last Year: Never true  Transportation Needs: No Transportation Needs (04/09/2023)   PRAPARE - Administrator, Civil Service (Medical): No    Lack of Transportation (Non-Medical): No  Physical Activity: Not on file  Stress: Not on file  Social Connections: Socially Isolated (04/09/2023)   Social Connection and Isolation Panel [NHANES]    Frequency of Communication with Friends and Family: Twice a week    Frequency of Social Gatherings with Friends and Family: Never    Attends Religious Services: Never    Database administrator or Organizations: No    Attends Banker Meetings: Never    Marital Status: Widowed  Intimate Partner Violence: Not At Risk (04/09/2023)   Humiliation, Afraid, Rape, and Kick questionnaire  Fear of Current or Ex-Partner: No    Emotionally Abused: No    Physically Abused: No    Sexually Abused: No     Family History  Problem Relation Age of Onset   Breast cancer Mother 48   Breast cancer Maternal Aunt     ROS: Otherwise negative unless mentioned in HPI  Physical Examination  Vitals:   04/10/23 0346 04/10/23 0757  BP: 129/60 (!) 162/82  Pulse: 85 97  Resp: 16 20  Temp: 98.2 F (36.8 C) 98 F (36.7 C)  SpO2: 96%    Body mass index is 21.8 kg/m.  General:  WDWN in NAD Gait: Not observed HENT: WNL, normocephalic Pulmonary: normal non-labored breathing, without Rales, rhonchi,  wheezing Cardiac: regular, without  Murmurs, rubs or gallops; without carotid bruits Abdomen: Positive bowel sounds throughout, soft, NT/ND, no masses Skin: without rashes Vascular Exam/Pulses: Positive Doppler pulses to bilateral lower extremities. Lower extremities are warm to the touch.  Positive gangrenous left great toe Extremities: with ischemic changes, with Gangrene , without cellulitis; with open wounds;  Musculoskeletal: Positive for muscle wasting or  atrophy. Contracted Left Lower Extremity.   Neurologic: A&O X 3;  No focal weakness or paresthesias are detected; speech is fluent/normal Psychiatric:  The pt has Normal affect. Lymph:  Unremarkable  CBC    Component Value Date/Time   WBC 5.8 04/10/2023 0442   RBC 2.46 (L) 04/10/2023 0442   HGB 7.7 (L) 04/10/2023 0442   HGB 8.6 (L) 09/25/2013 0410   HCT 23.5 (L) 04/10/2023 0442   HCT 27.0 (L) 09/24/2013 0409   PLT 152 04/10/2023 0442   PLT 192 09/24/2013 0409   MCV 95.5 04/10/2023 0442   MCV 96 09/24/2013 0409   MCH 31.3 04/10/2023 0442   MCHC 32.8 04/10/2023 0442   RDW 13.2 04/10/2023 0442   RDW 13.8 09/24/2013 0409   LYMPHSABS 1.6 04/09/2023 0915   LYMPHSABS 2.5 09/24/2013 0409   MONOABS 0.5 04/09/2023 0915   MONOABS 0.7 09/24/2013 0409   EOSABS 0.2 04/09/2023 0915   EOSABS 0.4 09/24/2013 0409   BASOSABS 0.0 04/09/2023 0915   BASOSABS 0.0 09/24/2013 0409    BMET    Component Value Date/Time   NA 130 (L) 04/10/2023 0442   NA 143 09/24/2013 0409   K 4.1 04/10/2023 0442   K 3.6 09/24/2013 0409   CL 103 04/10/2023 0442   CL 108 (H) 09/24/2013 0409   CO2 23 04/10/2023 0442   CO2 27 09/24/2013 0409   GLUCOSE 215 (H) 04/10/2023 0442   GLUCOSE 222 (H) 09/24/2013 0409   BUN 21 04/10/2023 0442   BUN 7 09/24/2013 0409   CREATININE 0.49 04/10/2023 0442   CREATININE 0.77 09/24/2013 0409   CALCIUM 8.3 (L) 04/10/2023 0442   CALCIUM 7.7 (L) 09/24/2013 0409   GFRNONAA >60 04/10/2023 0442   GFRNONAA >60 09/24/2013 0409   GFRAA >60 09/26/2019 1952   GFRAA >60 09/24/2013 0409    COAGS: Lab Results  Component Value Date   INR 1.2 08/26/2022   INR 1.2 08/08/2022     Non-Invasive Vascular Imaging:   Left Lower extremity Ultrasound with ABI's   Statin:  Yes.   Beta Blocker:  Yes.   Aspirin:  Yes.   ACEI:  No. ARB:  No. CCB use:  No Other antiplatelets/anticoagulants:  Yes.   Plavix 75 mg Daily   ASSESSMENT/PLAN: This is a 89 y.o. female who presented to [facility]  emergency department with hypoglycemia and positive leukocytes on urine  analysis.  Patient is treated empirically for UTI.  Upon workup was noted to have left lower extremity gangrenous great toe.  PLAN Vascular surgery recommends at this time getting lower extremity ultrasound with ABI.  If the ABI is abnormal the patient will require an angiogram to help with revascularization.  However this may be very difficult due to the fact that she is already contracted in her lower extremity past the 90 degree angle.  If the patient does have severe disease throughout her leg which I do not believe is the case due to the fact that we have strong Doppler pulses today at the bedside, the patient then would need an amputation that would be above-the-knee due to the contracture and due to the fact that she does not ambulate any longer.    Podiatry at this time feels that the patient has stable gangrene and is unsure that the patient would heal a partial ray amputation, conservative care to the toe at this time.  Patient is also well-known to podiatry and knows that this patient has now had a number of months of stable gangrene to her toe that does not appear to be worsened.  Podiatry will follow-up outpatient.   -I discussed the case with Dr. Levora Dredge MD and he agrees with the plan of care.   Marcie Bal [facility] 04/10/2023 9:23 AM

## 2023-04-11 DIAGNOSIS — E162 Hypoglycemia, unspecified: Secondary | ICD-10-CM | POA: Diagnosis not present

## 2023-04-11 LAB — BASIC METABOLIC PANEL
Anion gap: 7 (ref 5–15)
BUN: 21 mg/dL (ref 8–23)
CO2: 24 mmol/L (ref 22–32)
Calcium: 8.6 mg/dL — ABNORMAL LOW (ref 8.9–10.3)
Chloride: 104 mmol/L (ref 98–111)
Creatinine, Ser: 0.57 mg/dL (ref 0.44–1.00)
GFR, Estimated: >60 mL/min (ref >60)
Glucose, Bld: 169 mg/dL — ABNORMAL HIGH (ref 70–99)
Potassium: 4.5 mmol/L (ref 3.5–5.1)
Sodium: 135 mmol/L (ref 135–145)

## 2023-04-11 LAB — CBC
HCT: 25.6 % — ABNORMAL LOW (ref 36.0–46.0)
Hemoglobin: 8.2 g/dL — ABNORMAL LOW (ref 12.0–15.0)
MCH: 31.1 pg (ref 26.0–34.0)
MCHC: 32 g/dL (ref 30.0–36.0)
MCV: 97 fL (ref 80.0–100.0)
Platelets: 142 10*3/uL — ABNORMAL LOW (ref 150–400)
RBC: 2.64 MIL/uL — ABNORMAL LOW (ref 3.87–5.11)
RDW: 13.1 % (ref 11.5–15.5)
WBC: 6.1 10*3/uL (ref 4.0–10.5)
nRBC: 0 % (ref 0.0–0.2)

## 2023-04-11 LAB — URINE CULTURE: Culture: >=100000 — AB

## 2023-04-11 LAB — GLUCOSE, CAPILLARY
Glucose-Capillary: 127 mg/dL — ABNORMAL HIGH (ref 70–99)
Glucose-Capillary: 168 mg/dL — ABNORMAL HIGH (ref 70–99)
Glucose-Capillary: 204 mg/dL — ABNORMAL HIGH (ref 70–99)
Glucose-Capillary: 262 mg/dL — ABNORMAL HIGH (ref 70–99)

## 2023-04-11 MED ORDER — OXYCODONE HCL 5 MG PO TABS: 5.0000 mg | ORAL_TABLET | Freq: Three times a day (TID) | ORAL | 0 refills | Status: DC

## 2023-04-11 MED ORDER — POLYETHYLENE GLYCOL 3350 17 G PO PACK
34.0000 g | PACK | Freq: Once | ORAL | Status: AC
  Administered 2023-04-11: 34 g via ORAL
  Filled 2023-04-11: qty 2

## 2023-04-11 NOTE — Progress Notes (Signed)
 Pt discharging back to [facility] to Room 110-A I called [phone] and spoke with nurse Evon assuming care of her today. Pts family was called and notified and pt is scheduled to be transferred via EMS.

## 2023-04-11 NOTE — Discharge Summary (Signed)
 [name]:[id number] DOB: [birth year] DOA: 04/09/2023  PCP: Gracelyn Nurse, MD  Admit date: 04/09/2023 Discharge date: 04/11/2023  Time spent: 35 minutes  Recommendations for Outpatient Follow-up:  Pcp f/u Podiatry f/u 1-2 weeks (need to call and schedule) Vascular surgery f/u     Discharge Diagnoses:  Principal Problem:   Hypoglycemia Active Problems:   Type 2 diabetes mellitus with polyneuropathy (HCC)   Essential hypertension   Carotid artery disease (HCC)   CVA (cerebral vascular accident) (HCC)   PAD (peripheral artery disease) (HCC)   Diabetic foot infection (HCC)   Suspected UTI   Dementia (HCC)   Discharge Condition: stable  Diet recommendation: carb moeified  There were no vitals filed for this visit.  History of present illness:  From admission h and p [name] is a 89 y.o. female with medical history significant of dementia, type 2 diabetes, diabetic polyneuropathy, essential hypertension, dyslipidemia, peripheral arterial disease presenting with hypoglycemia suspected UTI.  Limited history in the setting of dementia.  Patient noted to be resident of [facility].  Per report patient with noted blood sugars in the 30s.  No reported recent medication/antidiabetic regimen change.  No fevers or chills.  No reported by patient's oral oral fluid intake.  At present, patient denies any chest pain, shortness of breath abdominal pain dysuria or back pain.  No reported falls or head trauma.  EMS subsequently called because of low blood sugar.  No glucose given at facility.  EMS provided 250 mL of D10 with glucose improving into the 200s   Hospital Course:  Patient presents with hypoglycemia. This is likely from taking a high dose sulfonylurea combined with insulin. NO AKI. We held the sulfonylurea and fed the patient and hypoglycemia resolved. Advise holding glimepiride, continuing insulin, and would advise starting low dose of a long-acting  insulin if fastings remain above goal. Patient noted to have dry gangrene of left first toe. Podiatry and vascular surgery consulted. ABI abnormal in the left leg. Given contractures in that leg and non-ambulatory status, vascular surgery did not advise revascularization, instead advised AKA should the gangrene significantly worsen. Per podiatry this is a stable problem, advise monitoring for the time being, f/u in their clinic in a week or two. There are  no signs of infection.   Procedures: none   Consultations: Vascular, podiatry  Discharge Exam: Vitals:   04/11/23 0341 04/11/23 0806  BP: (!) 113/58 127/64  Pulse: 87 80  Resp: 18 18  Temp: 98 F (36.7 C) 97.8 F (36.6 C)  SpO2: 100% 100%    General: NAD Cardiovascular: rrr, soft systolic murmur Respiratory: ctab   Discharge Instructions   Discharge Instructions     Diet - low sodium heart healthy   Complete by: As directed    Increase activity slowly   Complete by: As directed    No wound care   Complete by: As directed       Allergies as of 04/11/2023       Reactions   Metformin And Related Nausea And Vomiting        Medication List     STOP taking these medications    glimepiride 4 MG tablet Commonly known as: AMARYL   linezolid 600 MG tablet Commonly known as: ZYVOX       TAKE these medications    acetaminophen 325 MG tablet Commonly known as: TYLENOL Take 2 tablets (650 mg total) by mouth every 6 (six) hours as needed for  mild pain, moderate pain, fever or headache.   ascorbic acid 500 MG tablet Commonly known as: VITAMIN C Take 500 mg by mouth 2 (two) times daily.   aspirin EC 81 MG tablet Take 81 mg by mouth daily. Swallow whole.   atorvastatin 80 MG tablet Commonly known as: LIPITOR Take 1 tablet (80 mg total) by mouth daily. What changed: when to take this   clopidogrel 75 MG tablet Commonly known as: PLAVIX Take 1 tablet (75 mg total) by mouth daily.   cyanocobalamin 1000  MCG tablet Commonly known as: VITAMIN B12 Take 1,000 mcg by mouth daily.   feeding supplement (PRO-STAT SUGAR FREE 64) Liqd Take 30 mLs by mouth in the morning and at bedtime.   ferrous sulfate 325 (65 FE) MG tablet Take 325 mg by mouth every other day.   Glucagon Emergency 1 MG Kit Inject 1 mg into the muscle once as needed (low blood sugar).   hydrALAZINE 25 MG tablet Commonly known as: APRESOLINE Take 25 mg by mouth daily as needed.   insulin aspart 100 UNIT/ML injection Commonly known as: novoLOG Inject 2-10 Units into the skin 3 (three) times daily before meals. As directed depending on blood sugar   lisinopril 20 MG tablet Commonly known as: ZESTRIL Take 20 mg by mouth daily.   Magnesium Oxide -Mg Supplement 200 MG Tabs Take 200 mg by mouth daily.   multivitamin with minerals tablet Take 1 tablet by mouth daily.   naloxone 4 MG/0.1ML Liqd nasal spray kit Commonly known as: NARCAN Place 1 spray into the nose once.   oxyCODONE 5 MG immediate release tablet Commonly known as: Oxy IR/ROXICODONE Take 1 tablet (5 mg total) by mouth 3 (three) times daily.   polyethylene glycol 17 g packet Commonly known as: MIRALAX / GLYCOLAX Take 17 g by mouth daily.   Vitamin D (Ergocalciferol) 1.25 MG (50000 UNIT) Caps capsule Commonly known as: DRISDOL Take 50,000 Units by mouth every 7 (seven) days. thursday       Allergies  Allergen Reactions   Metformin And Related Nausea And Vomiting    Follow-up Information     Gracelyn Nurse, MD Follow up.   Specialty: Internal Medicine Why: Hospital Follow Up Contact information: [address] [facility] [address] [facility] [address] [facility] [place] [address] [phone]         Rosetta Posner, DPM Follow up.   Specialty: Podiatry Why: 1-2 weeks Contact information: [address] [place] [address] [phone]         Gilda Crease, Latina Craver, MD Follow up.   Specialties: Vascular Surgery, Cardiology, Radiology, Vascular  Surgery Contact information: [address] [place] [address] [phone]                  The results of significant diagnostics from this hospitalization (including imaging, microbiology, ancillary and laboratory) are listed below for reference.    Significant Diagnostic Studies: US ARTERIAL ABI (SCREENING LOWER EXTREMITY) Result Date: 04/10/2023 CLINICAL DATA:  Gangrene. EXAM: NONINVASIVE PHYSIOLOGIC VASCULAR STUDY OF BILATERAL LOWER EXTREMITIES TECHNIQUE: Evaluation of both lower extremities were performed at rest, including calculation of ankle-brachial indices with single level Doppler, pressure and pulse volume recording. COMPARISON:  None Available. FINDINGS: Right ABI:  1.09 Left ABI:  0.38 Right Lower Extremity:  Irregular waveforms in the right ankle. Left Lower Extremity:  Very irregular waveforms in the left ankle. < 0.5 Severe PAD IMPRESSION: 1. Severe arterial disease in the left lower extremity. Left ABI is 0.38.  2. Normal right ankle-brachial index. Electronically Signed   By: Richarda Overlie M.D.   On: 04/10/2023 15:25    Microbiology: Recent Results (from the past 240 hours)  Urine Culture     Status: Abnormal   Collection Time: 04/09/23  3:37 AM   Specimen: Urine, Clean Catch  Result Value Ref Range Status   Specimen Description   Final    URINE, CLEAN CATCH Performed at [facility], [address], [place], [place] [address]    Special Requests   Final    NONE Performed at [facility], [address] [place], [place] [address]    Culture >=100,000 COLONIES/mL KLEBSIELLA PNEUMONIAE (A)  Final   Report Status 04/11/2023 FINAL  Final   Organism ID, Bacteria KLEBSIELLA PNEUMONIAE (A)  Final      Susceptibility   Klebsiella pneumoniae - MIC*    AMPICILLIN >=32 RESISTANT Resistant     CEFAZOLIN <=4 SENSITIVE Sensitive     CEFEPIME <=0.12 SENSITIVE Sensitive     CEFTRIAXONE <=0.25 SENSITIVE Sensitive     CIPROFLOXACIN  <=0.25 SENSITIVE Sensitive     GENTAMICIN <=1 SENSITIVE Sensitive     IMIPENEM 0.5 SENSITIVE Sensitive     NITROFURANTOIN 128 RESISTANT Resistant     TRIMETH/SULFA <=20 SENSITIVE Sensitive     AMPICILLIN/SULBACTAM >=32 RESISTANT Resistant     PIP/TAZO 16 SENSITIVE Sensitive ug/mL    * >=100,000 COLONIES/mL KLEBSIELLA PNEUMONIAE  MRSA Next Gen by PCR, Nasal     Status: Abnormal   Collection Time: 04/09/23  7:42 AM   Specimen: Nasal Mucosa; Nasal Swab  Result Value Ref Range Status   MRSA by PCR Next Gen DETECTED (A) NOT DETECTED Final    Comment: RESULT CALLED TO, READ BACK BY AND VERIFIED WITH: Bjorn Pippin RN [id number] 04/09/23 HNM (NOTE) The GeneXpert MRSA Assay (FDA approved for NASAL specimens only), is one component of a comprehensive MRSA colonization surveillance program. It is not intended to diagnose MRSA infection nor to guide or monitor treatment for MRSA infections. Test performance is not FDA approved in patients less than 36 years old. Performed at [facility], [address] [place], [place] [address]   Culture, blood (Routine X 2) w Reflex to ID Panel     Status: None (Preliminary result)   Collection Time: 04/10/23  9:21 AM   Specimen: BLOOD  Result Value Ref Range Status   Specimen Description BLOOD BLOOD RIGHT HAND  Final   Special Requests Blood Culture adequate volume  Final   Culture   Final    NO GROWTH < 24 HOURS Performed at [facility], [address], [place], [place] [address]    Report Status PENDING  Incomplete  Culture, blood (Routine X 2) w Reflex to ID Panel     Status: None (Preliminary result)   Collection Time: 04/10/23  9:21 AM   Specimen: BLOOD  Result Value Ref Range Status   Specimen Description BLOOD BLOOD LEFT HAND  Final   Special Requests Blood Culture adequate volume  Final   Culture   Final    NO GROWTH < 24 HOURS Performed at [facility], [address] [place], [place] [address]    Report Status  PENDING  Incomplete     Labs: Basic Metabolic Panel: Recent Labs  Lab 04/09/23 0244 04/10/23 0442 04/11/23 0417  NA 136 130* 135  K 4.2 4.1 4.5  CL 104 103 104  CO2 22 23 24   GLUCOSE 230* 215*  169*  BUN 37* 21 21  CREATININE 0.74 0.49 0.57  CALCIUM 8.6* 8.3* 8.6*   Liver Function Tests: Recent Labs  Lab 04/09/23 0244 04/10/23 0442  AST 30 24  ALT 20 19  ALKPHOS 63 55  BILITOT 0.9 0.8  PROT 6.5 5.6*  ALBUMIN 3.1* 2.7*   No results for input(s): "LIPASE", "AMYLASE" in the last 168 hours. No results for input(s): "AMMONIA" in the last 168 hours. CBC: Recent Labs  Lab 04/09/23 0244 04/09/23 0915 04/10/23 0442 04/11/23 0417  WBC 7.5 7.9 5.8 6.1  NEUTROABS 6.0 5.7  --   --   HGB 8.6* 8.8* 7.7* 8.2*  HCT 28.4* 27.3* 23.5* 25.6*  MCV 102.5* 97.2 95.5 97.0  PLT 188 175 152 142*   Cardiac Enzymes: No results for input(s): "CKTOTAL", "CKMB", "CKMBINDEX", "TROPONINI" in the last 168 hours. BNP: BNP (last 3 results) No results for input(s): "BNP" in the last 8760 hours.  ProBNP (last 3 results) No results for input(s): "PROBNP" in the last 8760 hours.  CBG: Recent Labs  Lab 04/10/23 1823 04/10/23 2056 04/11/23 0029 04/11/23 0343 04/11/23 0814  GLUCAP 107* 162* 204* 168* 127*       Signed:  Silvano Bilis MD.  [facility] 04/11/2023, 10:13 AM

## 2023-04-11 NOTE — Progress Notes (Signed)
   Subjective/Chief Complaint: Dementia. However, without complaint.   Objective: Vital signs in last 24 hours: Temp:  [97.8 F (36.6 C)-98.5 F (36.9 C)] 97.8 F (36.6 C) (03/01 0806) Pulse Rate:  [78-87] 80 (03/01 0806) Resp:  [16-19] 18 (03/01 0806) BP: (113-151)/(58-74) 127/64 (03/01 0806) SpO2:  [100 %] 100 % (03/01 0806)    Intake/Output from previous day: 02/28 0701 - 03/01 0700 In: 120 [P.O.:120] Out: 501 [Urine:500; Stool:1] Intake/Output this shift: No intake/output data recorded.  General appearance: cooperative and no distress Extremities: warm, stable hallux dry gangrene  Lab Results:  Recent Labs    04/10/23 0442 04/11/23 0417  WBC 5.8 6.1  HGB 7.7* 8.2*  HCT 23.5* 25.6*  PLT 152 142*   BMET Recent Labs    04/10/23 0442 04/11/23 0417  NA 130* 135  K 4.1 4.5  CL 103 104  CO2 23 24  GLUCOSE 215* 169*  BUN 21 21  CREATININE 0.49 0.57  CALCIUM 8.3* 8.6*   PT/INR No results for input(s): "LABPROT", "INR" in the last 72 hours. ABG No results for input(s): "PHART", "HCO3" in the last 72 hours.  Invalid input(s): "PCO2", "PO2"  Studies/Results: US ARTERIAL ABI (SCREENING LOWER EXTREMITY) Result Date: 04/10/2023 CLINICAL DATA:  Gangrene. EXAM: NONINVASIVE PHYSIOLOGIC VASCULAR STUDY OF BILATERAL LOWER EXTREMITIES TECHNIQUE: Evaluation of both lower extremities were performed at rest, including calculation of ankle-brachial indices with single level Doppler, pressure and pulse volume recording. COMPARISON:  None Available. FINDINGS: Right ABI:  1.09 Left ABI:  0.38 Right Lower Extremity:  Irregular waveforms in the right ankle. Left Lower Extremity:  Very irregular waveforms in the left ankle. < 0.5 Severe PAD IMPRESSION: 1. Severe arterial disease in the left lower extremity. Left ABI is 0.38. 2. Normal right ankle-brachial index. Electronically Signed   By: Richarda Overlie M.D.   On: 04/10/2023 15:25    Anti-infectives: Anti-infectives (From  admission, onward)    Start     Dose/Rate Route Frequency Ordered Stop   04/10/23 0600  cefTRIAXone (ROCEPHIN) 2 g in sodium chloride 0.9 % 100 mL IVPB  Status:  Discontinued        2 g 200 mL/hr over 30 Minutes Intravenous Every 24 hours 04/09/23 0923 04/10/23 0841   04/09/23 0445  cefTRIAXone (ROCEPHIN) 1 g in sodium chloride 0.9 % 100 mL IVPB        1 g 200 mL/hr over 30 Minutes Intravenous  Once 04/09/23 0440 04/09/23 0529       Assessment/Plan:  Patient with stable LEFT hallux dry gangrene At this juncture would not recommend revascularization as the patient does not ambulate and has significant knee contracture. Currently the hallux is stable and dry. Continue conservative management per Podiatry However, if gangrene worsened would recommend an AKA. OK from Vascular standpoint to discharge to [facility]  LOS: 2 days    Eli Hose A 04/11/2023

## 2023-04-11 NOTE — TOC Progression Note (Signed)
 Transition of Care [facility]) - Progression Note    Patient Details  Name: [name] MRN: [id number] Date of Birth: [birth year]  [facility] CM/SW Contact  Hetty Ely, RN Phone Number: 04/11/2023, 10:23 AM  Clinical Narrative: To discharge to [facility] room 110-A. Nurse to call report [phone]. AEMS arranged.       Barriers to Discharge: Barriers Resolved  Expected Discharge Plan and Services         Expected Discharge Date: 04/11/23               DME Arranged: N/A DME Agency: NA         HH Agency: NA         Social Determinants of Health (SDOH) Interventions SDOH Screenings   Food Insecurity: No Food Insecurity (04/09/2023)  Housing: Low Risk  (04/09/2023)  Transportation Needs: No Transportation Needs (04/09/2023)  Utilities: Not At Risk (04/09/2023)  Social Connections: Socially Isolated (04/09/2023)  Tobacco Use: Low Risk  (04/09/2023)    Readmission Risk Interventions     No data to display

## 2023-04-15 LAB — CULTURE, BLOOD (ROUTINE X 2)
Culture: NO GROWTH
Culture: NO GROWTH
Special Requests: ADEQUATE
Special Requests: ADEQUATE

## 2023-04-28 ENCOUNTER — Emergency Department

## 2023-04-28 ENCOUNTER — Inpatient Hospital Stay
Admission: EM | Admit: 2023-04-28 | Discharge: 2023-05-04 | DRG: 239 | Disposition: A | Discharge status: Skilled Nursing Facility | Source: Ambulatory Visit | Attending: Osteopathic Medicine | Admitting: Osteopathic Medicine

## 2023-04-28 ENCOUNTER — Other Ambulatory Visit: Payer: Self-pay

## 2023-04-28 DIAGNOSIS — E1169 Type 2 diabetes mellitus with other specified complication: Secondary | ICD-10-CM | POA: Diagnosis present

## 2023-04-28 DIAGNOSIS — I10 Essential (primary) hypertension: Secondary | ICD-10-CM | POA: Diagnosis present

## 2023-04-28 DIAGNOSIS — G8929 Other chronic pain: Secondary | ICD-10-CM | POA: Diagnosis present

## 2023-04-28 DIAGNOSIS — I70262 Atherosclerosis of native arteries of extremities with gangrene, left leg: Secondary | ICD-10-CM | POA: Diagnosis not present

## 2023-04-28 DIAGNOSIS — Z803 Family history of malignant neoplasm of breast: Secondary | ICD-10-CM

## 2023-04-28 DIAGNOSIS — Z9889 Other specified postprocedural states: Secondary | ICD-10-CM | POA: Diagnosis not present

## 2023-04-28 DIAGNOSIS — E43 Unspecified severe protein-calorie malnutrition: Secondary | ICD-10-CM | POA: Insufficient documentation

## 2023-04-28 DIAGNOSIS — T82856A Stenosis of peripheral vascular stent, initial encounter: Secondary | ICD-10-CM | POA: Diagnosis not present

## 2023-04-28 DIAGNOSIS — Z79899 Other long term (current) drug therapy: Secondary | ICD-10-CM | POA: Diagnosis not present

## 2023-04-28 DIAGNOSIS — G8194 Hemiplegia, unspecified affecting left nondominant side: Secondary | ICD-10-CM | POA: Diagnosis present

## 2023-04-28 DIAGNOSIS — Z6821 Body mass index (BMI) 21.0-21.9, adult: Secondary | ICD-10-CM | POA: Diagnosis not present

## 2023-04-28 DIAGNOSIS — E1152 Type 2 diabetes mellitus with diabetic peripheral angiopathy with gangrene: Principal | ICD-10-CM | POA: Diagnosis present

## 2023-04-28 DIAGNOSIS — I96 Gangrene, not elsewhere classified: Secondary | ICD-10-CM | POA: Diagnosis present

## 2023-04-28 DIAGNOSIS — Z7902 Long term (current) use of antithrombotics/antiplatelets: Secondary | ICD-10-CM

## 2023-04-28 DIAGNOSIS — Z96652 Presence of left artificial knee joint: Secondary | ICD-10-CM | POA: Diagnosis not present

## 2023-04-28 DIAGNOSIS — E872 Acidosis, unspecified: Secondary | ICD-10-CM | POA: Diagnosis present

## 2023-04-28 DIAGNOSIS — D509 Iron deficiency anemia, unspecified: Secondary | ICD-10-CM | POA: Diagnosis present

## 2023-04-28 DIAGNOSIS — E785 Hyperlipidemia, unspecified: Secondary | ICD-10-CM | POA: Diagnosis present

## 2023-04-28 DIAGNOSIS — M24562 Contracture, left knee: Secondary | ICD-10-CM | POA: Diagnosis not present

## 2023-04-28 DIAGNOSIS — Z96653 Presence of artificial knee joint, bilateral: Secondary | ICD-10-CM | POA: Diagnosis present

## 2023-04-28 DIAGNOSIS — R339 Retention of urine, unspecified: Secondary | ICD-10-CM | POA: Diagnosis not present

## 2023-04-28 DIAGNOSIS — M869 Osteomyelitis, unspecified: Secondary | ICD-10-CM | POA: Diagnosis present

## 2023-04-28 DIAGNOSIS — D62 Acute posthemorrhagic anemia: Secondary | ICD-10-CM | POA: Diagnosis not present

## 2023-04-28 DIAGNOSIS — F03C0 Unspecified dementia, severe, without behavioral disturbance, psychotic disturbance, mood disturbance, and anxiety: Secondary | ICD-10-CM | POA: Diagnosis present

## 2023-04-28 DIAGNOSIS — E1142 Type 2 diabetes mellitus with diabetic polyneuropathy: Secondary | ICD-10-CM | POA: Diagnosis present

## 2023-04-28 DIAGNOSIS — L89629 Pressure ulcer of left heel, unspecified stage: Secondary | ICD-10-CM | POA: Diagnosis present

## 2023-04-28 DIAGNOSIS — Z794 Long term (current) use of insulin: Secondary | ICD-10-CM

## 2023-04-28 DIAGNOSIS — Z7982 Long term (current) use of aspirin: Secondary | ICD-10-CM | POA: Diagnosis not present

## 2023-04-28 DIAGNOSIS — I771 Stricture of artery: Secondary | ICD-10-CM | POA: Diagnosis not present

## 2023-04-28 DIAGNOSIS — Z6841 Body Mass Index (BMI) 40.0 and over, adult: Secondary | ICD-10-CM

## 2023-04-28 LAB — CBC WITH DIFFERENTIAL/PLATELET
Abs Immature Granulocytes: 0.03 10*3/uL (ref 0.00–0.07)
Basophils Absolute: 0 10*3/uL (ref 0.0–0.1)
Basophils Relative: 1 %
Eosinophils Absolute: 0.5 10*3/uL (ref 0.0–0.5)
Eosinophils Relative: 5 %
HCT: 32.8 % — ABNORMAL LOW (ref 36.0–46.0)
Hemoglobin: 10.2 g/dL — ABNORMAL LOW (ref 12.0–15.0)
Immature Granulocytes: 0 %
Lymphocytes Relative: 26 %
Lymphs Abs: 2.3 10*3/uL (ref 0.7–4.0)
MCH: 30.5 pg (ref 26.0–34.0)
MCHC: 31.1 g/dL (ref 30.0–36.0)
MCV: 98.2 fL (ref 80.0–100.0)
Monocytes Absolute: 0.5 10*3/uL (ref 0.1–1.0)
Monocytes Relative: 6 %
Neutro Abs: 5.3 10*3/uL (ref 1.7–7.7)
Neutrophils Relative %: 62 %
Platelets: 457 10*3/uL — ABNORMAL HIGH (ref 150–400)
RBC: 3.34 MIL/uL — ABNORMAL LOW (ref 3.87–5.11)
RDW: 13.8 % (ref 11.5–15.5)
WBC: 8.6 10*3/uL (ref 4.0–10.5)
nRBC: 0 % (ref 0.0–0.2)

## 2023-04-28 LAB — LACTIC ACID, PLASMA
Lactic Acid, Venous: 2.1 mmol/L (ref 0.5–1.9)
Lactic Acid, Venous: 1.9 mmol/L (ref 0.5–1.9)

## 2023-04-28 LAB — COMPREHENSIVE METABOLIC PANEL
ALT: 15 U/L (ref 0–44)
AST: 26 U/L (ref 15–41)
Albumin: 3.6 g/dL (ref 3.5–5.0)
Alkaline Phosphatase: 65 U/L (ref 38–126)
Anion gap: 11 (ref 5–15)
BUN: 31 mg/dL — ABNORMAL HIGH (ref 8–23)
CO2: 26 mmol/L (ref 22–32)
Calcium: 9.5 mg/dL (ref 8.9–10.3)
Chloride: 102 mmol/L (ref 98–111)
Creatinine, Ser: 0.66 mg/dL (ref 0.44–1.00)
GFR, Estimated: >60 mL/min (ref >60)
Glucose, Bld: 175 mg/dL — ABNORMAL HIGH (ref 70–99)
Potassium: 4.7 mmol/L (ref 3.5–5.1)
Sodium: 139 mmol/L (ref 135–145)
Total Bilirubin: 0.5 mg/dL (ref 0.0–1.2)
Total Protein: 7.6 g/dL (ref 6.5–8.1)

## 2023-04-28 LAB — GLUCOSE, CAPILLARY: Glucose-Capillary: 154 mg/dL — ABNORMAL HIGH (ref 70–99)

## 2023-04-28 MED ORDER — INSULIN ASPART 100 UNIT/ML IJ SOLN
0.0000 [IU] | Freq: Three times a day (TID) | INTRAMUSCULAR | Status: DC
  Administered 2023-04-29: 5 [IU] via SUBCUTANEOUS
  Administered 2023-05-01: 3 [IU] via SUBCUTANEOUS
  Administered 2023-05-02: 5 [IU] via SUBCUTANEOUS
  Administered 2023-05-03: 3 [IU] via SUBCUTANEOUS
  Administered 2023-05-03 (×2): 2 [IU] via SUBCUTANEOUS
  Administered 2023-05-04: 5 [IU] via SUBCUTANEOUS
  Filled 2023-04-28 (×8): qty 1

## 2023-04-28 MED ORDER — POLYETHYLENE GLYCOL 3350 17 G PO PACK: 17.0000 g | PACK | Freq: Every day | ORAL | Status: DC | PRN

## 2023-04-28 MED ORDER — VANCOMYCIN HCL IN DEXTROSE 1-5 GM/200ML-% IV SOLN
1000.0000 mg | Freq: Once | INTRAVENOUS | Status: DC
  Filled 2023-04-28: qty 200

## 2023-04-28 MED ORDER — MAGNESIUM OXIDE -MG SUPPLEMENT 400 (240 MG) MG PO TABS
200.0000 mg | ORAL_TABLET | Freq: Every day | ORAL | Status: DC
  Administered 2023-04-29 – 2023-05-04 (×5): 200 mg via ORAL
  Filled 2023-04-28 (×5): qty 1

## 2023-04-28 MED ORDER — LACTATED RINGERS IV BOLUS
1000.0000 mL | Freq: Once | INTRAVENOUS | Status: AC
  Administered 2023-04-28: 1000 mL via INTRAVENOUS

## 2023-04-28 MED ORDER — INSULIN ASPART 100 UNIT/ML IJ SOLN: 0.0000 [IU] | Freq: Every day | INTRAMUSCULAR | Status: DC

## 2023-04-28 MED ORDER — LACTATED RINGERS IV BOLUS
500.0000 mL | Freq: Once | INTRAVENOUS | Status: AC
  Administered 2023-04-28: 500 mL via INTRAVENOUS

## 2023-04-28 MED ORDER — LISINOPRIL 20 MG PO TABS
20.0000 mg | ORAL_TABLET | Freq: Every day | ORAL | Status: DC
  Administered 2023-04-29: 20 mg via ORAL
  Filled 2023-04-28: qty 1

## 2023-04-28 MED ORDER — ATORVASTATIN CALCIUM 20 MG PO TABS
80.0000 mg | ORAL_TABLET | Freq: Every day | ORAL | Status: DC
  Administered 2023-04-28 – 2023-05-03 (×6): 80 mg via ORAL
  Filled 2023-04-28 (×6): qty 4

## 2023-04-28 MED ORDER — CLOPIDOGREL BISULFATE 75 MG PO TABS
75.0000 mg | ORAL_TABLET | Freq: Every day | ORAL | Status: DC
  Administered 2023-04-29 – 2023-04-30 (×2): 75 mg via ORAL
  Filled 2023-04-28 (×2): qty 1

## 2023-04-28 MED ORDER — VANCOMYCIN HCL 750 MG/150ML IV SOLN
750.0000 mg | INTRAVENOUS | Status: DC
  Administered 2023-04-29 – 2023-04-30 (×2): 750 mg via INTRAVENOUS
  Filled 2023-04-28 (×2): qty 150

## 2023-04-28 MED ORDER — FERROUS SULFATE 325 (65 FE) MG PO TABS
325.0000 mg | ORAL_TABLET | ORAL | Status: DC
  Administered 2023-04-29 – 2023-05-03 (×2): 325 mg via ORAL
  Filled 2023-04-28 (×3): qty 1

## 2023-04-28 MED ORDER — ENOXAPARIN SODIUM 40 MG/0.4ML IJ SOSY
40.0000 mg | PREFILLED_SYRINGE | INTRAMUSCULAR | Status: DC
  Administered 2023-04-29 – 2023-05-04 (×5): 40 mg via SUBCUTANEOUS
  Filled 2023-04-28 (×5): qty 0.4

## 2023-04-28 MED ORDER — SODIUM CHLORIDE 0.9% FLUSH
3.0000 mL | Freq: Two times a day (BID) | INTRAVENOUS | Status: DC
Start: 2023-04-28 — End: 2023-05-02
  Administered 2023-04-28 – 2023-05-01 (×6): 3 mL via INTRAVENOUS

## 2023-04-28 MED ORDER — OXYCODONE HCL 5 MG PO TABS
5.0000 mg | ORAL_TABLET | ORAL | Status: DC | PRN
  Administered 2023-04-30 – 2023-05-01 (×2): 5 mg via ORAL
  Filled 2023-04-28: qty 1

## 2023-04-28 MED ORDER — PIPERACILLIN-TAZOBACTAM 3.375 G IVPB
3.3750 g | Freq: Three times a day (TID) | INTRAVENOUS | Status: DC
  Administered 2023-04-29 – 2023-05-02 (×10): 3.375 g via INTRAVENOUS
  Filled 2023-04-28 (×10): qty 50

## 2023-04-28 MED ORDER — VANCOMYCIN HCL IN DEXTROSE 1-5 GM/200ML-% IV SOLN
1000.0000 mg | Freq: Once | INTRAVENOUS | Status: AC
  Administered 2023-04-28: 1000 mg via INTRAVENOUS
  Filled 2023-04-28: qty 200

## 2023-04-28 MED ORDER — PRO-STAT SUGAR FREE PO LIQD
30.0000 mL | Freq: Two times a day (BID) | ORAL | Status: DC
  Administered 2023-04-28 – 2023-05-04 (×7): 30 mL via ORAL

## 2023-04-28 MED ORDER — ADULT MULTIVITAMIN W/MINERALS CH
1.0000 | ORAL_TABLET | Freq: Every day | ORAL | Status: DC
  Administered 2023-04-29 – 2023-05-04 (×5): 1 via ORAL
  Filled 2023-04-28 (×5): qty 1

## 2023-04-28 MED ORDER — ASPIRIN 81 MG PO TBEC
81.0000 mg | DELAYED_RELEASE_TABLET | Freq: Every day | ORAL | Status: DC
Start: 2023-04-29 — End: 2023-05-01
  Administered 2023-04-29 – 2023-04-30 (×2): 81 mg via ORAL
  Filled 2023-04-28 (×2): qty 1

## 2023-04-28 MED ORDER — POLYETHYLENE GLYCOL 3350 17 G PO PACK
17.0000 g | PACK | Freq: Every day | ORAL | Status: DC
  Administered 2023-04-29 – 2023-05-04 (×3): 17 g via ORAL
  Filled 2023-04-28 (×4): qty 1

## 2023-04-28 MED ORDER — VITAMIN B-12 1000 MCG PO TABS
1000.0000 ug | ORAL_TABLET | Freq: Every day | ORAL | Status: DC
  Administered 2023-04-29 – 2023-05-04 (×5): 1000 ug via ORAL
  Filled 2023-04-28 (×5): qty 1

## 2023-04-28 MED ORDER — PIPERACILLIN-TAZOBACTAM 3.375 G IVPB 30 MIN
3.3750 g | Freq: Once | INTRAVENOUS | Status: AC
  Administered 2023-04-28: 3.375 g via INTRAVENOUS
  Filled 2023-04-28: qty 50

## 2023-04-28 MED ORDER — ACETAMINOPHEN 650 MG RE SUPP: 650.0000 mg | Freq: Four times a day (QID) | RECTAL | Status: DC | PRN

## 2023-04-28 MED ORDER — ACETAMINOPHEN 325 MG PO TABS
650.0000 mg | ORAL_TABLET | Freq: Four times a day (QID) | ORAL | Status: DC | PRN
  Administered 2023-04-28 – 2023-05-04 (×6): 650 mg via ORAL
  Filled 2023-04-28 (×6): qty 2

## 2023-04-28 MED ORDER — VITAMIN C 500 MG PO TABS
500.0000 mg | ORAL_TABLET | Freq: Two times a day (BID) | ORAL | Status: DC
  Administered 2023-04-28 – 2023-05-04 (×11): 500 mg via ORAL
  Filled 2023-04-28 (×11): qty 1

## 2023-04-28 NOTE — Progress Notes (Signed)
 Pharmacy Antibiotic Note  [name] is a 89 y.o. female w/ PMH of  dementia, L hemiparesis with inability to meaningfully use left upper and lower extermties, DM, diabetic polyneuropathy, HTN, HLD, PVD admitted on 04/28/2023 with gangrene of the left big toe and with the intent for amputation.  Pharmacy has been consulted for vancomycin and Zosyn dosing.  Plan:  1) start Zosyn 3.375g IV q8h (4 hour infusion).  2) start vancomycin 1000 mg then 750 mg IV every 24 hours Goal AUC 400-550. Expected AUC: 453.1 SCr used: 0.80 mg/dL (rounded up)   Height: 5\' 4"  (162.6 cm) Weight: 57.6 kg (126 lb 15.8 oz) IBW/kg (Calculated) : 54.7  Temp (24hrs), Avg:98 F (36.7 C), Min:97.9 F (36.6 C), Max:98.1 F (36.7 C)  Recent Labs  Lab 04/28/23 1532 04/28/23 1534  WBC 8.6  --   CREATININE 0.66  --   LATICACIDVEN  --  2.1*    Estimated Creatinine Clearance: 42.8 mL/min (by C-G formula based on SCr of 0.66 mg/dL).    Allergies  Allergen Reactions   Metformin And Related Nausea And Vomiting    Antimicrobials this admission: 03/18 vancomycin >>  03/18 Zosyn >>   Microbiology results: 02/28 BCx: NG final 02/27 UCx: K pneumoniae 02/27 MRSA PCR: positive  Thank you for allowing pharmacy to be a part of this patient's care.  Lowella Bandy 04/28/2023 8:10 PM

## 2023-04-28 NOTE — ED Provider Notes (Signed)
 [facility] Provider Note    Event Date/Time   First MD Initiated Contact with Patient 04/28/23 1716     (approximate)   History   Toe Pain   HPI  [name] is a 89 y.o. female past medical history significant for dementia, hypertension, hyperlipidemia, diabetes, peripheral vascular disease followed by podiatry for gangrene of her big toe on the left foot, sent into the emergency department with concern for progression of gangrene.  Patient was evaluated by podiatry earlier today and sent to the emergency department for worsening infection of the great toe.  Patient states that she was having significant pain earlier today for great toe but does not have any pain at this time.  Denies fever or chills.       Physical Exam   Triage Vital Signs: ED Triage Vitals  Encounter Vitals Group     BP 04/28/23 1459 (!) 132/57     Systolic BP Percentile --      Diastolic BP Percentile --      Pulse Rate 04/28/23 1459 61     Resp 04/28/23 1459 18     Temp 04/28/23 1459 97.9 F (36.6 C)     Temp Source 04/28/23 1459 Oral     SpO2 04/28/23 1459 100 %     Weight 04/28/23 1456 126 lb 15.8 oz (57.6 kg)     Height 04/28/23 1456 5\' 4"  (1.626 m)     Head Circumference --      [facility] Flow --      Pain Score --      Pain Loc --      Pain Education --      Exclude from Growth Chart --     Most recent vital signs: Vitals:   04/28/23 1459 04/28/23 1809  BP: (!) 132/57 (!) 122/49  Pulse: 61 67  Resp: 18   Temp: 97.9 F (36.6 C)   SpO2: 100% 100%    Physical Exam Constitutional:      Appearance: She is well-developed.  HENT:     Head: Atraumatic.  Cardiovascular:     Rate and Rhythm: Regular rhythm.  Pulmonary:     Effort: No respiratory distress.  Abdominal:     General: There is no distension.  Musculoskeletal:        General: Normal range of motion.     Cervical back: Normal range of motion.     Comments: See picture of left great toe.   Skin:    General: Skin is warm.  Neurological:     Mental Status: She is alert. Mental status is at baseline.         IMPRESSION / MDM / ASSESSMENT AND PLAN / ED COURSE  I reviewed the triage vital signs and the nursing notes.  Differential diagnosis including gangrene, osteomyelitis, cellulitis  On chart review patient was seen by podiatry, unable to see their note today but do see a note from last week when they were considering admission for the patient given worsening gangrene.  Has a known history of peripheral vascular disease.  I do not see that she has been on antibiotics.    RADIOLOGY X-ray left great toe  LABS (all labs ordered are listed, but only abnormal results are displayed) Labs interpreted as -    Labs Reviewed  LACTIC ACID, PLASMA - Abnormal; Notable for the following components:      Result Value   Lactic Acid, Venous 2.1 (*)  All other components within normal limits  COMPREHENSIVE METABOLIC PANEL - Abnormal; Notable for the following components:   Glucose, Bld 175 (*)    BUN 31 (*)    All other components within normal limits  CBC WITH DIFFERENTIAL/PLATELET - Abnormal; Notable for the following components:   RBC 3.34 (*)    Hemoglobin 10.2 (*)    HCT 32.8 (*)    Platelets 457 (*)    All other components within normal limits  LACTIC ACID, PLASMA     MDM    Patient afebrile and hemodynamically stable.  No leukocytosis.  Mildly elevated lactic acid on arrival of 2.1.  Otherwise does not meet sepsis criteria.  Given 1 L of IV fluids and will repeat the lactic acid.  Obtaining an x-ray of the great toe to further evaluate for possible osteomyelitis.  Started on antibiotics with vancomycin and Zosyn for polymicrobial coverage.  Consulted hospitalist for admission for left great toe infection.   PROCEDURES:  Critical Care performed: No  Procedures  Patient's presentation is most consistent with acute presentation with potential threat to  life or bodily function.   MEDICATIONS ORDERED IN ED: Medications  vancomycin (VANCOCIN) IVPB 1000 mg/200 mL premix (has no administration in time range)  piperacillin-tazobactam (ZOSYN) IVPB 3.375 g (has no administration in time range)  lactated ringers bolus 500 mL (has no administration in time range)    FINAL CLINICAL IMPRESSION(S) / ED DIAGNOSES   Final diagnoses:  Gangrene of toe of left foot (HCC)     Rx / DC Orders   ED Discharge Orders     None        Note:  This document was prepared using Dragon voice recognition software and may include unintentional dictation errors.   Corena Herter, MD 04/28/23 [phone]

## 2023-04-28 NOTE — ED Triage Notes (Signed)
 Pt sent over from Podiatry for gangrene in left big toe. Per son pt is to be seen by vascular and then be admitted for amputation of toe. Pt is alert and orient x 2  to self and DOB.

## 2023-04-28 NOTE — Progress Notes (Signed)
 ED Pharmacy Antibiotic Sign Off An antibiotic consult was received from an ED provider for vancomycin per pharmacy dosing for a wound infection. A chart review was completed to assess appropriateness.   The following one time order(s) were placed:   vancomycin 1000 mg IV x 1  Further antibiotic and/or antibiotic pharmacy consults should be ordered by the admitting provider if indicated.   Thank you for allowing pharmacy to be a part of this patient's care.   Lowella Bandy, [facility]  Clinical Pharmacist 04/28/23 6:21 PM

## 2023-04-28 NOTE — Assessment & Plan Note (Addendum)
 This appears chronic/dr - but vascular concerned about it turning wet.  Both Dr. Wyn Quaker and Excell Seltzer has been engaged from vascular as well as podiatry.  Patient is s/p vancomycin and Zosyn in the ER - c.w. same..  Currently no evidence of any systemic toxicity.  I believe the light mild lactic acidosis is result of ischemia itself.  Patient has received 500 cc of fluid bolus.  We will trend creatinine as well as check lactic acid again in the morning.  Pain is already well-controlled. I will give 1 more liter of lr

## 2023-04-28 NOTE — H&P (Addendum)
 History and Physical    Patient: [name] DOB: 1933-03-09 DOA: 04/28/2023 DOS: the patient was seen and examined on 04/28/2023 PCP: Gracelyn Nurse, MD  Patient coming from:  sent by podiatry  Chief Complaint:  Chief Complaint  Patient presents with   Toe Pain   HPI: [name] is a 89 y.o. female with medical history significant of dementia and left hemiparesis with inability to meaningfully use left upper and lower extermties.type 2 diabetes, diabetic polyneuropathy, essential hypertension, dyslipidemia, peripheral arterial disease.  She is ordinarily a resident of nursing home facility.  Patient is also under the guardianship of Department of Social Services represented by [name].  History is limited due to patient's dementia.  Principally obtained from the ER provider.  Patient apparently had been seen by podiatry as well as vascular surgery over the last 1 week and had been advised to come to the hospital due to gangrene of the left big toe and with the intent for amputation of the toe.  Patient finally presented to the ER today.  There is no report of patient having any fever vomiting diarrhea falls or trauma.  Patient has received vancomycin and Zosyn in the ER.  Medical evaluation is sought.  I discussed the case with [name] as above over the phone, she will get back in touch with me about the patient's CODE STATUS, we do not have current documentation in the chart.      Review of Systems: unable to review all systems due to the inability of the patient to answer questions. Past Medical History:  Diagnosis Date   Carpal tunnel syndrome on both sides    Diabetes (HCC)    Diabetic polyneuropathy (HCC)    Diverticulitis    Hyperlipidemia    Hypertension    Osteoarthritis    Vaginal prolapse    Past Surgical History:  Procedure Laterality Date   AMPUTATION TOE Left 08/13/2022   Procedure: EXCISION OF 5TH RAY;  Surgeon: Gwyneth Revels,  DPM;  Location: [facility] ORS;  Service: Orthopedics/Podiatry;  Laterality: Left;   CESAREAN SECTION     IRRIGATION AND DEBRIDEMENT FOOT Left 01/09/2022   Procedure: IRRIGATION AND DEBRIDEMENT FOOT WITH BONE BIOPSY;  Surgeon: Gwyneth Revels, DPM;  Location: [facility] ORS;  Service: Podiatry;  Laterality: Left;   LOWER EXTREMITY ANGIOGRAPHY Left 06/11/2021   Procedure: Lower Extremity Angiography;  Surgeon: Renford Dills, MD;  Location: [facility] INVASIVE CV LAB;  Service: Cardiovascular;  Laterality: Left;   LOWER EXTREMITY ANGIOGRAPHY Left 08/11/2022   Procedure: Lower Extremity Angiography;  Surgeon: Renford Dills, MD;  Location: [facility] INVASIVE CV LAB;  Service: Cardiovascular;  Laterality: Left;   pessary     REPLACEMENT TOTAL KNEE BILATERAL     VENTRAL HERNIA REPAIR     VENTRAL HERNIA REPAIR N/A 01/13/2016   Procedure: HERNIA REPAIR VENTRAL ADULT WITH SMALL BOWEL RESECTION, LYSIS OF ADHESIONS;  Surgeon: Gladis Riffle, MD;  Location: [facility] ORS;  Service: General;  Laterality: N/A;   Social History:  reports that she has never smoked. She has never used smokeless tobacco. She reports that she does not drink alcohol and does not use drugs.  Allergies  Allergen Reactions   Metformin And Related Nausea And Vomiting    Family History  Problem Relation Age of Onset   Breast cancer Mother 56   Breast cancer Maternal Aunt     Prior to Admission medications   Medication Sig Start Date End Date Taking? Authorizing Provider  acetaminophen (TYLENOL) 325 MG tablet Take 2 tablets (650 mg total) by mouth every 6 (six) hours as needed for mild pain, moderate pain, fever or headache. 01/25/16   Loflin, Laney Potash, MD  Amino Acids-Protein Hydrolys (FEEDING SUPPLEMENT, PRO-STAT SUGAR FREE 64,) LIQD Take 30 mLs by mouth in the morning and at bedtime.    [provider]  ascorbic acid (VITAMIN C) 500 MG tablet Take 500 mg by mouth 2 (two) times daily.    [provider]  aspirin EC 81 MG  tablet Take 81 mg by mouth daily. Swallow whole.    [provider]  atorvastatin (LIPITOR) 80 MG tablet Take 1 tablet (80 mg total) by mouth daily. Patient taking differently: Take 80 mg by mouth at bedtime. 09/26/19   Regalado, Belkys A, MD  clopidogrel (PLAVIX) 75 MG tablet Take 1 tablet (75 mg total) by mouth daily. 09/26/19   Regalado, Belkys A, MD  cyanocobalamin (VITAMIN B12) 1000 MCG tablet Take 1,000 mcg by mouth daily.    [provider]  ferrous sulfate 325 (65 FE) MG tablet Take 325 mg by mouth every other day.    [provider]  Glucagon, rDNA, (GLUCAGON EMERGENCY) 1 MG KIT Inject 1 mg into the muscle once as needed (low blood sugar). 08/26/22   [provider]  hydrALAZINE (APRESOLINE) 25 MG tablet Take 25 mg by mouth daily as needed. 03/04/23   [provider]  insulin aspart (NOVOLOG) 100 UNIT/ML injection Inject 2-10 Units into the skin 3 (three) times daily before meals. As directed depending on blood sugar    [provider]  lisinopril (ZESTRIL) 20 MG tablet Take 20 mg by mouth daily. 08/01/22   [provider]  Magnesium Oxide -Mg Supplement 200 MG TABS Take 200 mg by mouth daily.    [provider]  Multiple Vitamins-Minerals (MULTIVITAMIN WITH MINERALS) tablet Take 1 tablet by mouth daily.    [provider]  naloxone [facility]) nasal spray 4 mg/0.1 mL Place 1 spray into the nose once.    [provider]  oxyCODONE (OXY IR/ROXICODONE) 5 MG immediate release tablet Take 1 tablet (5 mg total) by mouth 3 (three) times daily. 04/11/23   Wouk, Wilfred Curtis, MD  polyethylene glycol (MIRALAX / GLYCOLAX) 17 g packet Take 17 g by mouth daily.    [provider]  Vitamin D, Ergocalciferol, (DRISDOL) 1.25 MG (50000 UNIT) CAPS capsule Take 50,000 Units by mouth every 7 (seven) days. thursday    [provider]    Physical Exam: Vitals:   04/28/23 1459 04/28/23 1809 04/28/23 1830 04/28/23  1924  BP: (!) 132/57 (!) 122/49 (!) 141/59   Pulse: 61 67 65   Resp: 18  15   Temp: 97.9 F (36.6 C)   98.1 F (36.7 C)  TempSrc: Oral   Oral  SpO2: 100% 100% 100%   Weight:      Height:       General: Patient is alert and awake, pleasant.  Appears to be in no distress.  Gives me name, however is not able to give me full date of birth.  Date of birth was confirmed by band on the wrist. Respiratory exam: Bilateral intravesicular Cardiovascular exam S1-S2 normal Abdomen soft nontender Extremities warm no edema.  Thin lady. Left hemiparesis with contractures as at the left knee partial flexion as well as left hand is apparent. There is flexure safety device on the left hand already. Left great toe dry appearing gangrene.  No evidence of spreading cellulitis or purulence at this time.    Media Information  Document Information  Photos  L great toe  04/28/2023 18:14  Attached To:  Hospital Encounter on 04/28/23  Source Information  Mumma, Carollee Herter, MD  [facility]  Document History     Data Reviewed:  Labs on Admission:  Results for orders placed or performed during the hospital encounter of 04/28/23 (from the past 24 hours)  Comprehensive metabolic panel     Status: Abnormal   Collection Time: 04/28/23  3:32 PM  Result Value Ref Range   Sodium 139 135 - 145 mmol/L   Potassium 4.7 3.5 - 5.1 mmol/L   Chloride 102 98 - 111 mmol/L   CO2 26 22 - 32 mmol/L   Glucose, Bld 175 (H) 70 - 99 mg/dL   BUN 31 (H) 8 - 23 mg/dL   Creatinine, Ser 1.30 0.44 - 1.00 mg/dL   Calcium 9.5 8.9 - 86.5 mg/dL   Total Protein 7.6 6.5 - 8.1 g/dL   Albumin 3.6 3.5 - 5.0 g/dL   AST 26 15 - 41 U/L   ALT 15 0 - 44 U/L   Alkaline Phosphatase 65 38 - 126 U/L   Total Bilirubin 0.5 0.0 - 1.2 mg/dL   GFR, Estimated >78 >46 mL/min   Anion gap 11 5 - 15  CBC with Differential     Status: Abnormal   Collection Time: 04/28/23  3:32 PM  Result Value Ref Range   WBC 8.6 4.0 - 10.5 K/uL    RBC 3.34 (L) 3.87 - 5.11 MIL/uL   Hemoglobin 10.2 (L) 12.0 - 15.0 g/dL   HCT 96.2 (L) 95.2 - 84.1 %   MCV 98.2 80.0 - 100.0 fL   MCH 30.5 26.0 - 34.0 pg   MCHC 31.1 30.0 - 36.0 g/dL   RDW 32.4 40.1 - 02.7 %   Platelets 457 (H) 150 - 400 K/uL   nRBC 0.0 0.0 - 0.2 %   Neutrophils Relative % 62 %   Neutro Abs 5.3 1.7 - 7.7 K/uL   Lymphocytes Relative 26 %   Lymphs Abs 2.3 0.7 - 4.0 K/uL   Monocytes Relative 6 %   Monocytes Absolute 0.5 0.1 - 1.0 K/uL   Eosinophils Relative 5 %   Eosinophils Absolute 0.5 0.0 - 0.5 K/uL   Basophils Relative 1 %   Basophils Absolute 0.0 0.0 - 0.1 K/uL   Immature Granulocytes 0 %   Abs Immature Granulocytes 0.03 0.00 - 0.07 K/uL  Lactic acid, plasma     Status: Abnormal   Collection Time: 04/28/23  3:34 PM  Result Value Ref Range   Lactic Acid, Venous 2.1 (HH) 0.5 - 1.9 mmol/L   Basic Metabolic Panel: Recent Labs  Lab 04/28/23 1532  NA 139  K 4.7  CL 102  CO2 26  GLUCOSE 175*  BUN 31*  CREATININE 0.66  CALCIUM 9.5   Liver Function Tests: Recent Labs  Lab 04/28/23 1532  AST 26  ALT 15  ALKPHOS 65  BILITOT 0.5  PROT 7.6  ALBUMIN 3.6   No results for input(s): "LIPASE", "AMYLASE" in the last 168 hours. No results for input(s): "AMMONIA" in the last 168 hours. CBC: Recent Labs  Lab 04/28/23 1532  WBC 8.6  NEUTROABS 5.3  HGB 10.2*  HCT 32.8*  MCV 98.2  PLT 457*   Cardiac Enzymes: No results for input(s): "CKTOTAL", "CKMB", "CKMBINDEX", "TROPONINIHS" in the last 168 hours.  BNP (last 3  results) No results for input(s): "PROBNP" in the last 8760 hours. CBG: No results for input(s): "GLUCAP" in the last 168 hours.  Radiological Exams on Admission:  No results found.  chest X-ray   No intake/output data recorded. No intake/output data recorded.       Assessment and Plan: * Toe gangrene (HCC) This appears chronic/dr - but vascular concerned about it turning wet.  Both Dr. Wyn Quaker and Excell Seltzer has been engaged from  vascular as well as podiatry.  Patient is s/p vancomycin and Zosyn in the ER - c.w. same..  Currently no evidence of any systemic toxicity.  I believe the light mild lactic acidosis is result of ischemia itself.  Patient has received 500 cc of fluid bolus.  We will trend creatinine as well as check lactic acid again in the morning.  Pain is already well-controlled. I will give 1 more liter of lr   Chronic pain: Change oxycodone standing to 5 mg every 4 hours as needed.  Prior documented peripheral arterial disease: Continue with aspirin and statin.  Plavix  Diabetes mellitus: Insulin sliding scale will be ordered.   HTN -patient diastolic blood pressure is ranging from 49-59 at this time.  Patient getting fluid bolus.  Hold off on Zestril at this time   Bowel regimen ordered with MiraLAX  Continue with feeding supplementation with Pro-Stat  Continue with home vitamin B-12 ferrous sulfate and magnesium oxide   Advance Care Planning:   Code Status: Full Code at this time we have no advance care documentation.  I am hoping to hear back from [name] regarding patient CODE STATUS.  Continue with full code till then Addendum - heard back from office of guardian. Patient is curently full code. If felt to be inappropriate, more discussion can be held with them in AM  Consults: Both vascular and podiatry has been engaged as above  Family Communication: Guardian updated.  Severity of Illness: The appropriate patient status for this patient is INPATIENT. Inpatient status is judged to be reasonable and necessary in order to provide the required intensity of service to ensure the patient's safety. The patient's presenting symptoms, physical exam findings, and initial radiographic and laboratory data in the context of their chronic comorbidities is felt to place them at high risk for further clinical deterioration. Furthermore, it is not anticipated that the patient will be medically stable for  discharge from the hospital within 2 midnights of admission.   * I certify that at the point of admission it is my clinical judgment that the patient will require inpatient hospital care spanning beyond 2 midnights from the point of admission due to high intensity of service, high risk for further deterioration and high frequency of surveillance required.*  Author: Nolberto Hanlon, MD 04/28/2023 7:42 PM  For on call review [email or web address].

## 2023-04-29 ENCOUNTER — Encounter: Payer: Self-pay | Admitting: Internal Medicine

## 2023-04-29 DIAGNOSIS — I96 Gangrene, not elsewhere classified: Secondary | ICD-10-CM | POA: Diagnosis not present

## 2023-04-29 LAB — BASIC METABOLIC PANEL
Anion gap: 3 — ABNORMAL LOW (ref 5–15)
BUN: 29 mg/dL — ABNORMAL HIGH (ref 8–23)
CO2: 25 mmol/L (ref 22–32)
Calcium: 8.3 mg/dL — ABNORMAL LOW (ref 8.9–10.3)
Chloride: 107 mmol/L (ref 98–111)
Creatinine, Ser: 0.53 mg/dL (ref 0.44–1.00)
GFR, Estimated: >60 mL/min (ref >60)
Glucose, Bld: 95 mg/dL (ref 70–99)
Potassium: 4 mmol/L (ref 3.5–5.1)
Sodium: 135 mmol/L (ref 135–145)

## 2023-04-29 LAB — SURGICAL PCR SCREEN
MRSA, PCR: POSITIVE — AB
Staphylococcus aureus: POSITIVE — AB

## 2023-04-29 LAB — CBC
HCT: 26.4 % — ABNORMAL LOW (ref 36.0–46.0)
Hemoglobin: 8.4 g/dL — ABNORMAL LOW (ref 12.0–15.0)
MCH: 30.5 pg (ref 26.0–34.0)
MCHC: 31.8 g/dL (ref 30.0–36.0)
MCV: 96 fL (ref 80.0–100.0)
Platelets: 319 10*3/uL (ref 150–400)
RBC: 2.75 MIL/uL — ABNORMAL LOW (ref 3.87–5.11)
RDW: 13.7 % (ref 11.5–15.5)
WBC: 6.8 10*3/uL (ref 4.0–10.5)
nRBC: 0 % (ref 0.0–0.2)

## 2023-04-29 LAB — PROTIME-INR
INR: 1.3 — ABNORMAL HIGH (ref 0.8–1.2)
Prothrombin Time: 15.9 s — ABNORMAL HIGH (ref 11.4–15.2)

## 2023-04-29 LAB — GLUCOSE, CAPILLARY
Glucose-Capillary: 201 mg/dL — ABNORMAL HIGH (ref 70–99)
Glucose-Capillary: 105 mg/dL — ABNORMAL HIGH (ref 70–99)
Glucose-Capillary: 127 mg/dL — ABNORMAL HIGH (ref 70–99)

## 2023-04-29 LAB — APTT: aPTT: 40 s — ABNORMAL HIGH (ref 24–36)

## 2023-04-29 LAB — LACTIC ACID, PLASMA: Lactic Acid, Venous: 0.7 mmol/L (ref 0.5–1.9)

## 2023-04-29 MED ORDER — GLUCERNA SHAKE PO LIQD
237.0000 mL | Freq: Three times a day (TID) | ORAL | Status: DC
  Administered 2023-04-29 – 2023-05-04 (×9): 237 mL via ORAL

## 2023-04-29 MED ORDER — LISINOPRIL 10 MG PO TABS
10.0000 mg | ORAL_TABLET | Freq: Every day | ORAL | Status: DC
Start: 2023-04-30 — End: 2023-05-02
  Administered 2023-04-30: 10 mg via ORAL
  Filled 2023-04-29: qty 1

## 2023-04-29 MED ORDER — ORAL CARE MOUTH RINSE: 15.0000 mL | OROMUCOSAL | Status: DC | PRN

## 2023-04-29 NOTE — Plan of Care (Signed)
  Problem: Coping: Goal: Ability to adjust to condition or change in health will improve Outcome: Progressing   Problem: Health Behavior/Discharge Planning: Goal: Ability to identify and utilize available resources and services will improve Outcome: Progressing   

## 2023-04-29 NOTE — Plan of Care (Signed)
   Problem: Coping: Goal: Level of anxiety will decrease Outcome: Progressing   Problem: Pain Managment: Goal: General experience of comfort will improve and/or be controlled Outcome: Progressing   Problem: Safety: Goal: Ability to remain free from injury will improve Outcome: Progressing

## 2023-04-29 NOTE — Hospital Course (Addendum)
 Hospital course / significant events:   HPI: [name] is a 89 y.o. female with medical history significant of dementia and left hemiparesis with inability to meaningfully use left upper and lower extermties.type 2 diabetes, diabetic polyneuropathy, essential hypertension, dyslipidemia, peripheral arterial disease.  She is ordinarily a resident of nursing home facility.  Patient is also under the guardianship of Department of Social Services represented by [name].  History limited due to patient's dementia. Patient apparently had been seen by podiatry as well as vascular surgery over the last wek prior to coming to the ED, and had been advised to come to the hospital due to gangrene of the left big toe and with the intent for amputation of the toe.  Patient finally presented to the ER 03/18.    03/18: admitted to hospitalist for gangrene, consulted podiatry and vascular surgery. Continued on Vanc, Zosyn. No sepsis.  03/19: plan for angio tomorrow  03/20:  angiography and balloon angioplasty 03/21: L foot ray amputation and heel debridement. Per Dr Excell Seltzer, leave dressing in place until clinic follow up.  03/22: dc iv abx, initiate keflex per podiatry recs, stable for discharge, facility can take her back [facility] 03/24 03/23: urinary retention, Foley placed.     Consultants:  Podiatry  Vascular Surgery   Procedures/Surgeries: 04/30/23 angiography and balloon angioplasty w/ Dr Wyn Quaker 05/01/23 L foot partial first ray amputation, L posterior lateral heel subcutaneous wound debridement w/ Dr Excell Seltzer      ASSESSMENT & PLAN:   Toe gangrene L hallux w/ osteomyelitis No sepsis  Early pressure sore to the left heel.  Today 05/02/23 is POD2 LLE angio Today 05/02/23 is POD1 L partial ray amputation and wound debridement  Vascular and Podiatry teams involved per hospital course - currently signed off please reengage as needed  Abx: vancomycin, Zosyn --> podiatry recs for 7d course of Keflex  (500 mg tid, final dose Fri 3/28 2200) and Dr Excell Seltzer to f/u on culture report and path report next week, make adjustments as needed need to wear prevalon boots all the time.  Pain control     Chronic pain:  oxycodone 5 mg every 4 hours as needed. Bowel regimen ordered with MiraLAX   Anemia, iron deficiency Likely some ABLA d/t surgeries  Continue iron supplements Monitor CBC Transfuse for <7 Hgb  Prior documented peripheral arterial disease:  Aspirin, statin, Plavix   Diabetes mellitus with neuropathy Insulin sliding scale   HTN  diastolic blood pressure is ranging from 49-59 at this time.   Initially reduced lisinopril, will increase today given BP improving/high    Malnutrition  Nutrition/Supplements Continue with feeding supplementation with Pro-Stat Continue with home vitamin B-12 ferrous sulfate and magnesium oxide  Urinary retention Fail in/out cath Foley in place Void trial as able  No concerns based on BMI: Body mass index is 21.8 kg/m.  Underweight - under 18  overweight - 25 to 29 obese - 30 or more Class 1 obesity: BMI of 30.0 to 34 Class 2 obesity: BMI of 35.0 to 39 Class 3 obesity: BMI of 40.0 to 49 Super Morbid Obesity: BMI 50-59 Super-super Morbid Obesity: BMI 60+ Significantly low or high BMI is associated with higher medical risk.  Weight management advised as adjunct to other disease management and risk reduction treatments    DVT prophylaxis: lovenox IV fluids: no continuous IV fluids  Nutrition: cardiac/carb diet  Central lines / other devices: none  Code Status: spoke w/ guardian [name] 8:09 AM 04/29/23 and confirmed  FULL CODE unless there is decompensation / new clinical development such that CPR/resuscitation would be futile, will leave full code and will update guardian if there is strong recommendation to change this.  ACP documentation reviewed: none on file in [facility]  [facility] needs: return to SNF w/ rehab  Medical barriers to dispo: none,  pt can return to facility when they can resume care

## 2023-04-29 NOTE — Consult Note (Signed)
 Hospital Consult    Reason for Consult:  Left Lower extremity Gangrene  Requesting Physician:  Dr [name] Revels MD  MRN #:  [id number]  History of Present Illness: This is a 89 y.o. female past medical history significant for dementia, hypertension, hyperlipidemia, diabetes, peripheral vascular disease followed by podiatry for gangrene of her big toe on the left foot, sent into the emergency department with concern for progression of gangrene. Patient was evaluated by podiatry earlier today and sent to the emergency department for worsening infection of the great toe. Patient states that she was having significant pain earlier today for great toe but does not have any pain at this time. Denies fever or chills.   She is ordinarily a resident of nursing home facility. Patient is also under the guardianship of Department of Social Services represented by [name]. History is limited due to patient's dementia. Vascular Surgery consulted to evaluate.   Past Medical History:  Diagnosis Date   Carpal tunnel syndrome on both sides    Diabetes (HCC)    Diabetic polyneuropathy (HCC)    Diverticulitis    Hyperlipidemia    Hypertension    Osteoarthritis    Vaginal prolapse     Past Surgical History:  Procedure Laterality Date   AMPUTATION TOE Left 08/13/2022   Procedure: EXCISION OF 5TH RAY;  Surgeon: [name], DPM;  Location: [facility] ORS;  Service: Orthopedics/Podiatry;  Laterality: Left;   CESAREAN SECTION     IRRIGATION AND DEBRIDEMENT FOOT Left 01/09/2022   Procedure: IRRIGATION AND DEBRIDEMENT FOOT WITH BONE BIOPSY;  Surgeon: [name], DPM;  Location: [facility] OR[facility];  Service: Podiatry;  Laterality: Left;   LOWER EXTREMITY ANGIOGRAPHY Left 06/11/2021   Procedure: Lower Extremity Angiography;  Surgeon: [name] Dills, MD;  Location: [facility] IN[facility]IVE [facility];  Service: Cardiovascular;  Laterality: Left;   LOWER EXTREMITY ANGIOGRAPHY Left 08/11/2022   Procedure: Lower Extremity Angiography;   Surgeon: [name] Dills, MD;  Location: [facility] IN[facility] CV [facility];  Service: Cardiovascular;  Laterality: Left;   pessary     REPLACEMENT TOTAL KNEE BILATERAL     VENTRAL HERNIA REPAIR     VENTRAL HERNIA REPAIR N/A 01/13/2016   Procedure: HERNIA REPAIR VENTRAL ADULT WITH SMALL BOWEL RESECTION, LYSIS OF ADHESIONS;  Surgeon: [name] Riffle, MD;  Location: [facility] OR[facility];  Service: General;  Laterality: N/A;    Allergies  Allergen Reactions   Metformin And Related Nausea And Vomiting    Prior to Admission medications   Medication Sig Start Date End Date Taking? Authorizing Provider  acetaminophen (TYLENOL) 325 MG tablet Take 2 tablets (650 mg total) by mouth every 6 (six) hours as needed for mild pain, moderate pain, fever or headache. 01/25/16   Loflin, Laney Potash, MD  Amino Acids-Protein Hydrolys (FEEDING SUPPLEMENT, PRO-STAT SUGAR FREE 64,) LIQD Take 30 mLs by mouth in the morning and at bedtime.    [provider]  ascorbic acid (VITAMIN C) 500 MG tablet Take 500 mg by mouth 2 (two) times daily.    [provider]  aspirin EC 81 MG tablet Take 81 mg by mouth daily. Swallow whole.    [provider]  atorvastatin (LIPITOR) 80 MG tablet Take 1 tablet (80 mg total) by mouth daily. Patient taking differently: Take 80 mg by mouth at bedtime. 09/26/19   [name], [name] A, MD  clopidogrel (PLAVIX) 75 MG tablet Take 1 tablet (75 mg total) by mouth daily. 09/26/19   [name], [name] A, MD  cyanocobalamin (VITAMIN B12)  1000 MCG tablet Take 1,000 mcg by mouth daily.    [provider]  ferrous sulfate 325 (65 FE) MG tablet Take 325 mg by mouth every other day.    [provider]  Glucagon, rDNA, (GLUCAGON EMERGENCY) 1 MG KIT Inject 1 mg into the muscle once as needed (low blood sugar). 08/26/22   [provider]  hydrALAZINE (APRESOLINE) 25 MG tablet Take 25 mg by mouth daily as needed. 03/04/23   [provider]  insulin aspart (NOVOLOG) 100  UNIT/ML injection Inject 2-10 Units into the skin 3 (three) times daily before meals. As directed depending on blood sugar    [provider]  lisinopril (ZESTRIL) 20 MG tablet Take 20 mg by mouth daily. 08/01/22   [provider]  Magnesium Oxide -Mg Supplement 200 MG TABS Take 200 mg by mouth daily.    [provider]  Multiple Vitamins-Minerals (MULTIVITAMIN WITH MINERALS) tablet Take 1 tablet by mouth daily.    [provider]  naloxone [facility]) nasal spray 4 mg/0.1 mL Place 1 spray into the nose once.    [provider]  oxyCODONE (OXY IR/ROXICODONE) 5 MG immediate release tablet Take 1 tablet (5 mg total) by mouth 3 (three) times daily. 04/11/23   [name], [name] Curtis, MD  polyethylene glycol (MIRALAX / GLYCOLAX) 17 g packet Take 17 g by mouth daily.    [provider]  Vitamin D, Ergocalciferol, (DRISDOL) 1.25 MG (50000 UNIT) CAPS capsule Take 50,000 Units by mouth every 7 (seven) days. thursday    [provider]    Social History   Socioeconomic History   Marital status: Widowed    Spouse name: Not on file   Number of children: Not on file   Years of education: Not on file   Highest education level: Not on file  Occupational History   Not on file  Tobacco Use   Smoking status: Never   Smokeless tobacco: Never  Vaping Use   Vaping status: Never Used  Substance and Sexual Activity   Alcohol use: No   Drug use: No   Sexual activity: Not Currently  Other Topics Concern   Not on file  Social History Narrative   Lives at home by herself. Ambulates with a cane.   Social Drivers of Corporate investment banker Strain: Not on file  Food Insecurity: Patient Unable To Answer (04/28/2023)   Hunger Vital Sign    Worried About Running Out of Food in the Last Year: Patient unable to answer    Ran Out of Food in the Last Year: Patient unable to answer  Transportation Needs: Patient Unable To Answer (04/28/2023)   PRAPARE -  Transportation    Lack of Transportation (Medical): Patient unable to answer    Lack of Transportation (Non-Medical): Patient unable to answer  Physical Activity: Not on file  Stress: Not on file  Social Connections: Unknown (04/28/2023)   Social Connection and Isolation Panel [NHANES]    Frequency of Communication with Friends and Family: Patient unable to answer    Frequency of Social Gatherings with Friends and Family: Patient unable to answer    Attends Religious Services: Patient unable to answer    Active Member of Clubs or Organizations: Patient unable to answer    Attends Banker Meetings: Patient unable to answer    Marital Status: Widowed  Recent Concern: Social Connections - Socially Isolated (04/09/2023)   Social Connection and Isolation Panel [NHANES]    Frequency  of Communication with Friends and Family: Twice a week    Frequency of Social Gatherings with Friends and Family: Never    Attends Religious Services: Never    Database administrator or Organizations: No    Attends Banker Meetings: Never    Marital Status: Widowed  Intimate Partner Violence: Patient Unable To Answer (04/28/2023)   Humiliation, Afraid, Rape, and Kick questionnaire    Fear of Current or Ex-Partner: Patient unable to answer    Emotionally Abused: Patient unable to answer    Physically Abused: Patient unable to answer    Sexually Abused: Patient unable to answer     Family History  Problem Relation Age of Onset   Breast cancer Mother 35   Breast cancer Maternal Aunt     ROS: Otherwise negative unless mentioned in HPI  Physical Examination  Vitals:   04/28/23 2107 04/29/23 0206  BP: (!) 152/66 (!) 142/63  Pulse: 66 68  Resp: 16 17  Temp: 98.2 F (36.8 C) 98 F (36.7 C)  SpO2: 100% 97%   Body mass index is 21.8 kg/m.  General:  WDWN in NAD Gait: Not observed HENT: WNL, normocephalic Pulmonary: normal non-labored breathing, without Rales, rhonchi,   wheezing Cardiac: regular, without  Murmurs, rubs or gallops; without carotid bruits Abdomen: Positive bowel sounds throughout, soft, NT/ND, no masses Skin: without rashes Vascular Exam/Pulses: Unable to palpate or doppler Popliteal, Dorsalis Pedis or postibial artery pulses in left lower extremity. Left lower extremity cool to touch. Left Lower heel with open wound. Right lower extremity warm to touch. Positive doppler DP/PT  Extremities: with ischemic changes, with Gangrene , with cellulitis; with open wounds;  Musculoskeletal: no muscle wasting or atrophy  Neurologic: A&O X 3;  No focal weakness or paresthesias are detected; speech is fluent/normal Psychiatric:  The pt has Normal affect. Lymph:  Unremarkable  CBC    Component Value Date/Time   WBC 6.8 04/29/2023 0557   RBC 2.75 (L) 04/29/2023 0557   HGB 8.4 (L) 04/29/2023 0557   HGB 8.6 (L) 09/25/2013 0410   HCT 26.4 (L) 04/29/2023 0557   HCT 27.0 (L) 09/24/2013 0409   PLT 319 04/29/2023 0557   PLT 192 09/24/2013 0409   MCV 96.0 04/29/2023 0557   MCV 96 09/24/2013 0409   MCH 30.5 04/29/2023 0557   MCHC 31.8 04/29/2023 0557   RDW 13.7 04/29/2023 0557   RDW 13.8 09/24/2013 0409   LYMPHSABS 2.3 04/28/2023 1532   LYMPHSABS 2.5 09/24/2013 0409   MONOABS 0.5 04/28/2023 1532   MONOABS 0.7 09/24/2013 0409   EOSABS 0.5 04/28/2023 1532   EOSABS 0.4 09/24/2013 0409   BASOSABS 0.0 04/28/2023 1532   BASOSABS 0.0 09/24/2013 0409    BMET    Component Value Date/Time   NA 135 04/29/2023 0557   NA 143 09/24/2013 0409   K 4.0 04/29/2023 0557   K 3.6 09/24/2013 0409   CL 107 04/29/2023 0557   CL 108 (H) 09/24/2013 0409   CO2 25 04/29/2023 0557   CO2 27 09/24/2013 0409   GLUCOSE 95 04/29/2023 0557   GLUCOSE 222 (H) 09/24/2013 0409   BUN 29 (H) 04/29/2023 0557   BUN 7 09/24/2013 0409   CREATININE 0.53 04/29/2023 0557   CREATININE 0.77 09/24/2013 0409   CALCIUM 8.3 (L) 04/29/2023 0557   CALCIUM 7.7 (L) 09/24/2013 0409   GFRNONAA  >60 04/29/2023 0557   GFRNONAA >60 09/24/2013 0409   GFRAA >60 09/26/2019 1952   GFRAA >60  09/24/2013 0409    COAGS: Lab Results  Component Value Date   INR 1.3 (H) 04/29/2023   INR 1.2 08/26/2022   INR 1.2 08/08/2022     Non-Invasive Vascular Imaging:   EXAM:04/10/23 NONINVASIVE PHYSIOLOGIC VASCULAR STUDY OF BILATERAL LOWER EXTREMITIES   TECHNIQUE: Evaluation of both lower extremities were performed at rest, including calculation of ankle-brachial indices with single level Doppler, pressure and pulse volume recording.   COMPARISON:  None Available.   FINDINGS: Right ABI:  1.09   Left ABI:  0.38   Right Lower Extremity:  Irregular waveforms in the right ankle.   Left Lower Extremity:  Very irregular waveforms in the left ankle.   < 0.5 Severe PAD   IMPRESSION: 1. Severe arterial disease in the left lower extremity. Left ABI is 0.38. 2. Normal right ankle-brachial index.      Statin:  Yes.   Beta Blocker:  No. Aspirin:  Yes.   ACEI:  Yes.   ARB:  No. CCB use:  No Other antiplatelets/anticoagulants:  Yes.   Plavix 75 mg Daily   ASSESSMENT/PLAN: This is a 89 y.o. female who was sent to the emergency department with concerns for progressive gangrene to her left lower extremity.  She was noted to have severe arterial disease from a left ABI that is 0.38.  On exam this morning patient is severely confused to name only.  Currently in the care of a nursing home facility with a guardianship by the state.  The Department of Social Services providing guardianship is [name].  Vascular surgery plans on taking the patient to the vascular lab sometime this week for a left lower extremity angiogram with possible intervention.  I discussed in detail with the guardianship of [name] and [facility] DSS supervisor Candace Gobel the procedure, benefits, risk, and complications.  They both verbalized her understanding and give authorization and consent to  proceed with a left lower extremity angiogram with possible intervention.  I answered all their questions this morning.  I informed that the patient will be made n.p.o. the midnight before the schedule of the procedure.   -I discussed the case in detail with Dr. Festus Barren MD and he agrees with the plan   Marcie Bal [facility] 04/29/2023 7:13 AM

## 2023-04-29 NOTE — Progress Notes (Addendum)
 PROGRESS NOTE    [name]   WNU:[id number] DOB: [birth year]  DOA: 04/28/2023 Date of Service: 04/29/23 which is hospital day 1  PCP: Gracelyn Nurse, MD    Hospital course / significant events:   HPI: [name] is a 89 y.o. female with medical history significant of dementia and left hemiparesis with inability to meaningfully use left upper and lower extermties.type 2 diabetes, diabetic polyneuropathy, essential hypertension, dyslipidemia, peripheral arterial disease.  She is ordinarily a resident of nursing home facility.  Patient is also under the guardianship of Department of Social Services represented by [name].  History limited due to patient's dementia. Patient apparently had been seen by podiatry as well as vascular surgery over the last wek prior to coming to the ED, and had been advised to come to the hospital due to gangrene of the left big toe and with the intent for amputation of the toe.  Patient finally presented to the ER 03/18.    03/18: admitted to hospitalist for gangrene, consulted podiatry and vascular surgery. Continued on Vanc, Zosyn. No sepsis.  03/19: plan for angio tomorrow      Consultants:  Podiatry  Vascular Surgery   Procedures/Surgeries: none      ASSESSMENT & PLAN:   Toe gangrene L hallux No sepsis  This appears chronic/dry - vascular concerned about it turning wet. No sepsis  Dr. Wyn Quaker and Excell Seltzer has been engaged from vascular as well as podiatry respectively.  plan for angio tomorrow  If sufficient circulation to heal a partial first ray amputation then podiatry will likely proceed with this on Friday 03/21 Abx: vancomycin, Zosyn Pain control   Betadine wet-to-dry dressings daily to the left foot  Prevalon boots    Chronic pain:  oxycodone standing to 5 mg every 4 hours as needed. Bowel regimen ordered with MiraLAX   Prior documented peripheral arterial disease:  Aspirin, statin, Plavix   Diabetes mellitus with  neuropathy Insulin sliding scale   HTN  diastolic blood pressure is ranging from 49-59 at this time.   Reduce dose on Zestril at this time   Nutrition/Supplements Continue with feeding supplementation with Pro-Stat Continue with home vitamin B-12 ferrous sulfate and magnesium oxide    No concerns based on BMI: Body mass index is 21.8 kg/m.  Underweight - under 18  overweight - 25 to 29 obese - 30 or more Class 1 obesity: BMI of 30.0 to 34 Class 2 obesity: BMI of 35.0 to 39 Class 3 obesity: BMI of 40.0 to 49 Super Morbid Obesity: BMI 50-59 Super-super Morbid Obesity: BMI 60+ Significantly low or high BMI is associated with higher medical risk.  Weight management advised as adjunct to other disease management and risk reduction treatments    DVT prophylaxis: lovenox IV fluids: no continuous IV fluids  Nutrition: cardiac/carb diet  Central lines / other devices: none  Code Status: spoke w/ guardian [name] 8:09 AM 04/29/23 and confirmed FULL CODE unless there is decompensation / new clinical development such that CPR/resuscitation would be futile, will leave full code and will update guardian if there is strong recommendation to change this.  ACP documentation reviewed: none on file in [facility]  TOC needs: TBD but expect will need SNF/return to SNF w/ rehab  Medical barriers to dispo: vascular and podiatry following, likely for angio/amputation. Expected medical readiness for discharge in several days.              Subjective / Brief ROS:  Patient reports no  concerns or questions at this time  Denies CP/SOB.  Pain controlled.  Denies new weakness.  Tolerating diet.  Reports no concerns w/ urination/defecation.   Family Communication: spoke w/ guardian this morning     Objective Findings:  Vitals:   04/28/23 2000 04/28/23 2107 04/29/23 0206 04/29/23 0939  BP: (!) 112/47 (!) 152/66 (!) 142/63 (!) 153/58  Pulse: (!) 56 66 68 82  Resp: 15 16 17 12   Temp:  98.2  F (36.8 C) 98 F (36.7 C) 97.7 F (36.5 C)  TempSrc:  Oral Oral Oral  SpO2: 100% 100% 97% 100%  Weight:      Height:        Intake/Output Summary (Last 24 hours) at 04/29/2023 1538 Last data filed at 04/29/2023 1241 Gross per 24 hour  Intake 720 ml  Output --  Net 720 ml   Filed Weights   04/28/23 1456  Weight: 57.6 kg    Examination:  Physical Exam Constitutional:      General: She is not in acute distress.    Appearance: She is not ill-appearing.  Cardiovascular:     Rate and Rhythm: Normal rate and regular rhythm.  Pulmonary:     Effort: Pulmonary effort is normal.     Breath sounds: Normal breath sounds.  Abdominal:     Palpations: Abdomen is soft.  Neurological:     Mental Status: She is alert. Mental status is at baseline.  Psychiatric:        Mood and Affect: Mood normal.        Behavior: Behavior normal.          Scheduled Medications:   ascorbic acid  500 mg Oral BID   aspirin EC  81 mg Oral Daily   atorvastatin  80 mg Oral QHS   clopidogrel  75 mg Oral Daily   cyanocobalamin  1,000 mcg Oral Daily   enoxaparin (LOVENOX) injection  40 mg Subcutaneous Q24H   feeding supplement (PRO-STAT SUGAR FREE 64)  30 mL Oral BID   ferrous sulfate  325 mg Oral QODAY   insulin aspart  0-15 Units Subcutaneous TID WC   insulin aspart  0-5 Units Subcutaneous QHS   [START ON 04/30/2023] lisinopril  10 mg Oral Daily   magnesium oxide  200 mg Oral Daily   multivitamin with minerals  1 tablet Oral Daily   polyethylene glycol  17 g Oral Daily   sodium chloride flush  3 mL Intravenous Q12H    Continuous Infusions:  piperacillin-tazobactam (ZOSYN)  IV 3.375 g (04/29/23 1241)   vancomycin      PRN Medications:  acetaminophen **OR** acetaminophen, mouth rinse, oxyCODONE, polyethylene glycol  Antimicrobials from admission:  Anti-infectives (From admission, onward)    Start     Dose/Rate Route Frequency Ordered Stop   04/29/23 1600  vancomycin (VANCOREADY) IVPB  750 mg/150 mL        750 mg 150 mL/hr over 60 Minutes Intravenous Every 24 hours 04/28/23 2017     04/29/23 0600  piperacillin-tazobactam (ZOSYN) IVPB 3.375 g        3.375 g 12.5 mL/hr over 240 Minutes Intravenous Every 8 hours 04/28/23 2017     04/28/23 2130  vancomycin (VANCOCIN) IVPB 1000 mg/200 mL premix        1,000 mg 200 mL/hr over 60 Minutes Intravenous  Once 04/28/23 2114 04/28/23 2346   04/28/23 1830  vancomycin (VANCOCIN) IVPB 1000 mg/200 mL premix  Status:  Discontinued  1,000 mg 200 mL/hr over 60 Minutes Intravenous  Once 04/28/23 1818 04/28/23 2114   04/28/23 1830  piperacillin-tazobactam (ZOSYN) IVPB 3.375 g        3.375 g 100 mL/hr over 30 Minutes Intravenous  Once 04/28/23 1818 04/28/23 1957           Data Reviewed:  I have personally reviewed the following...  CBC: Recent Labs  Lab 04/28/23 1532 04/29/23 0557  WBC 8.6 6.8  NEUTROABS 5.3  --   HGB 10.2* 8.4*  HCT 32.8* 26.4*  MCV 98.2 96.0  PLT 457* 319   Basic Metabolic Panel: Recent Labs  Lab 04/28/23 1532 04/29/23 0557  NA 139 135  K 4.7 4.0  CL 102 107  CO2 26 25  GLUCOSE 175* 95  BUN 31* 29*  CREATININE 0.66 0.53  CALCIUM 9.5 8.3*   GFR: Estimated Creatinine Clearance: 42.8 mL/min (by C-G formula based on SCr of 0.53 mg/dL). Liver Function Tests: Recent Labs  Lab 04/28/23 1532  AST 26  ALT 15  ALKPHOS 65  BILITOT 0.5  PROT 7.6  ALBUMIN 3.6   No results for input(s): "LIPASE", "AMYLASE" in the last 168 hours. No results for input(s): "AMMONIA" in the last 168 hours. Coagulation Profile: Recent Labs  Lab 04/29/23 0557  INR 1.3*   Cardiac Enzymes: No results for input(s): "CKTOTAL", "CKMB", "CKMBINDEX", "TROPONINI" in the last 168 hours. BNP (last 3 results) No results for input(s): "PROBNP" in the last 8760 hours. HbA1C: No results for input(s): "HGBA1C" in the last 72 hours. CBG: Recent Labs  Lab 04/28/23 2105 04/29/23 1226  GLUCAP 154* 201*   Lipid  Profile: No results for input(s): "CHOL", "HDL", "LDLCALC", "TRIG", "CHOLHDL", "LDLDIRECT" in the last 72 hours. Thyroid Function Tests: No results for input(s): "TSH", "T4TOTAL", "FREET4", "T3FREE", "THYROIDAB" in the last 72 hours. Anemia Panel: No results for input(s): "VITAMINB12", "FOLATE", "FERRITIN", "TIBC", "IRON", "RETICCTPCT" in the last 72 hours. Most Recent Urinalysis On File:     Component Value Date/Time   COLORURINE YELLOW (A) 04/09/2023 0337   APPEARANCEUR CLOUDY (A) 04/09/2023 0337   LABSPEC 1.016 04/09/2023 0337   PHURINE 5.0 04/09/2023 0337   GLUCOSEU 50 (A) 04/09/2023 0337   HGBUR NEGATIVE 04/09/2023 0337   BILIRUBINUR NEGATIVE 04/09/2023 0337   BILIRUBINUR neg 08/08/2019 0922   KETONESUR 5 (A) 04/09/2023 0337   PROTEINUR NEGATIVE 04/09/2023 0337   UROBILINOGEN 0.2 08/08/2019 0922   NITRITE NEGATIVE 04/09/2023 0337   LEUKOCYTESUR LARGE (A) 04/09/2023 0337   Sepsis Labs: @LABRCNTIP (procalcitonin:4,lacticidven:4) Microbiology: No results found for this or any previous visit (from the past 240 hours).    Radiology Studies last 3 days: DG Toe Great Left Result Date: 04/28/2023 CLINICAL DATA:  Gangrene in great toe EXAM: LEFT GREAT TOE COMPARISON:  None Available. FINDINGS: Extensive cortical erosion in the head of the proximal phalanx left great toe, with height loss more marked laterally than medially. Focal areas of bone loss at the lateral margin base distal phalanx. There is resultant lateral angulation of the distal phalanx. Extensive vascular site calcifications are noted in the distal foot. IMPRESSION: 1. Extensive septic arthritis and osteomyelitis of the great toe as above. Electronically Signed   By: Corlis Leak M.D.   On: 04/28/2023 19:58          Sunnie Nielsen, DO [facility] 04/29/2023, 3:38 PM    Dictation software may have been used to generate the above note. Typos may occur and escape review in typed/dictated notes. Please  contact  Dr Lyn Hollingshead directly for clarity if needed.  Staff may message me via secure chat in Epic  but this may not receive an immediate response,  please page me for urgent matters!  If 7PM-7AM, please contact night coverage [email or web address]

## 2023-04-29 NOTE — H&P (View-Only) (Signed)
 Hospital Consult    Reason for Consult:  Left Lower extremity Gangrene  Requesting Physician:  Dr Gwyneth Revels MD  MRN #:  [id number]  History of Present Illness: This is a 89 y.o. female past medical history significant for dementia, hypertension, hyperlipidemia, diabetes, peripheral vascular disease followed by podiatry for gangrene of her big toe on the left foot, sent into the emergency department with concern for progression of gangrene. Patient was evaluated by podiatry earlier today and sent to the emergency department for worsening infection of the great toe. Patient states that she was having significant pain earlier today for great toe but does not have any pain at this time. Denies fever or chills.   She is ordinarily a resident of nursing home facility. Patient is also under the guardianship of Department of Social Services represented by [name]. History is limited due to patient's dementia. Vascular Surgery consulted to evaluate.   Past Medical History:  Diagnosis Date   Carpal tunnel syndrome on both sides    Diabetes (HCC)    Diabetic polyneuropathy (HCC)    Diverticulitis    Hyperlipidemia    Hypertension    Osteoarthritis    Vaginal prolapse     Past Surgical History:  Procedure Laterality Date   AMPUTATION TOE Left 08/13/2022   Procedure: EXCISION OF 5TH RAY;  Surgeon: Gwyneth Revels, DPM;  Location: [facility] OR[facility];  Service: Orthopedics/Podiatry;  Laterality: Left;   CESAREAN SECTION     IRRIGATION AND DEBRIDEMENT FOOT Left 01/09/2022   Procedure: IRRIGATION AND DEBRIDEMENT FOOT WITH BONE BIOPSY;  Surgeon: Gwyneth Revels, DPM;  Location: [facility];  Service: Podiatry;  Laterality: Left;   LOWER EXTREMITY ANGIOGRAPHY Left 06/11/2021   Procedure: Lower Extremity Angiography;  Surgeon: Renford Dills, MD;  Location: [facility] IN[facility]IVE [facility];  Service: Cardiovascular;  Laterality: Left;   LOWER EXTREMITY ANGIOGRAPHY Left 08/11/2022   Procedure: Lower Extremity Angiography;   Surgeon: Renford Dills, MD;  Location: [facility] IN[facility]AS[facility];  Service: Cardiovascular;  Laterality: Left;   pessary     REPLACEMENT TOTAL KNEE BILATERAL     VENTRAL HERNIA REPAIR     VENTRAL HERNIA REPAIR N/A 01/13/2016   Procedure: HERNIA REPAIR VENTRAL ADULT WITH SMALL BOWEL RESECTION, LYSIS OF ADHESIONS;  Surgeon: Gladis Riffle, MD;  Location: [facility] OR[facility];  Service: General;  Laterality: N/A;    Allergies  Allergen Reactions   Metformin And Related Nausea And Vomiting    Prior to Admission medications   Medication Sig Start Date End Date Taking? Authorizing Provider  acetaminophen (TYLENOL) 325 MG tablet Take 2 tablets (650 mg total) by mouth every 6 (six) hours as needed for mild pain, moderate pain, fever or headache. 01/25/16   Loflin, Laney Potash, MD  Amino Acids-Protein Hydrolys (FEEDING SUPPLEMENT, PRO-STAT SUGAR FREE 64,) LIQD Take 30 mLs by mouth in the morning and at bedtime.    [provider]  ascorbic acid (VITAMIN C) 500 MG tablet Take 500 mg by mouth 2 (two) times daily.    [provider]  aspirin EC 81 MG tablet Take 81 mg by mouth daily. Swallow whole.    [provider]  atorvastatin (LIPITOR) 80 MG tablet Take 1 tablet (80 mg total) by mouth daily. Patient taking differently: Take 80 mg by mouth at bedtime. 09/26/19   Regalado, Belkys A, MD  clopidogrel (PLAVIX) 75 MG tablet Take 1 tablet (75 mg total) by mouth daily. 09/26/19   Regalado, Belkys A, MD  cyanocobalamin (VITAMIN B12)  1000 MCG tablet Take 1,000 mcg by mouth daily.    [provider]  ferrous sulfate 325 (65 FE) MG tablet Take 325 mg by mouth every other day.    [provider]  Glucagon, rDNA, (GLUCAGON EMERGENCY) 1 MG KIT Inject 1 mg into the muscle once as needed (low blood sugar). 08/26/22   [provider]  hydrALAZINE (APRESOLINE) 25 MG tablet Take 25 mg by mouth daily as needed. 03/04/23   [provider]  insulin aspart (NOVOLOG) 100  UNIT/ML injection Inject 2-10 Units into the skin 3 (three) times daily before meals. As directed depending on blood sugar    [provider]  lisinopril (ZESTRIL) 20 MG tablet Take 20 mg by mouth daily. 08/01/22   [provider]  Magnesium Oxide -Mg Supplement 200 MG TABS Take 200 mg by mouth daily.    [provider]  Multiple Vitamins-Minerals (MULTIVITAMIN WITH MINERALS) tablet Take 1 tablet by mouth daily.    [provider]  naloxone [facility]) nasal spray 4 mg/0.1 mL Place 1 spray into the nose once.    [provider]  oxyCODONE (OXY IR/ROXICODONE) 5 MG immediate release tablet Take 1 tablet (5 mg total) by mouth 3 (three) times daily. 04/11/23   Wouk, Wilfred Curtis, MD  polyethylene glycol (MIRALAX / GLYCOLAX) 17 g packet Take 17 g by mouth daily.    [provider]  Vitamin D, Ergocalciferol, (DRISDOL) 1.25 MG (50000 UNIT) CAPS capsule Take 50,000 Units by mouth every 7 (seven) days. thursday    [provider]    Social History   Socioeconomic History   Marital status: Widowed    Spouse name: Not on file   Number of children: Not on file   Years of education: Not on file   Highest education level: Not on file  Occupational History   Not on file  Tobacco Use   Smoking status: Never   Smokeless tobacco: Never  Vaping Use   Vaping status: Never Used  Substance and Sexual Activity   Alcohol use: No   Drug use: No   Sexual activity: Not Currently  Other Topics Concern   Not on file  Social History Narrative   Lives at home by herself. Ambulates with a cane.   Social Drivers of Corporate investment banker Strain: Not on file  Food Insecurity: Patient Unable To Answer (04/28/2023)   Hunger Vital Sign    Worried About Running Out of Food in the Last Year: Patient unable to answer    Ran Out of Food in the Last Year: Patient unable to answer  Transportation Needs: Patient Unable To Answer (04/28/2023)   PRAPARE -  Transportation    Lack of Transportation (Medical): Patient unable to answer    Lack of Transportation (Non-Medical): Patient unable to answer  Physical Activity: Not on file  Stress: Not on file  Social Connections: Unknown (04/28/2023)   Social Connection and Isolation Panel [NHANES]    Frequency of Communication with Friends and Family: Patient unable to answer    Frequency of Social Gatherings with Friends and Family: Patient unable to answer    Attends Religious Services: Patient unable to answer    Active Member of Clubs or Organizations: Patient unable to answer    Attends Banker Meetings: Patient unable to answer    Marital Status: Widowed  Recent Concern: Social Connections - Socially Isolated (04/09/2023)   Social Connection and Isolation Panel [NHANES]    Frequency  of Communication with Friends and Family: Twice a week    Frequency of Social Gatherings with Friends and Family: Never    Attends Religious Services: Never    Database administrator or Organizations: No    Attends Banker Meetings: Never    Marital Status: Widowed  Intimate Partner Violence: Patient Unable To Answer (04/28/2023)   Humiliation, Afraid, Rape, and Kick questionnaire    Fear of Current or Ex-Partner: Patient unable to answer    Emotionally Abused: Patient unable to answer    Physically Abused: Patient unable to answer    Sexually Abused: Patient unable to answer     Family History  Problem Relation Age of Onset   Breast cancer Mother 35   Breast cancer Maternal Aunt     ROS: Otherwise negative unless mentioned in HPI  Physical Examination  Vitals:   04/28/23 2107 04/29/23 0206  BP: (!) 152/66 (!) 142/63  Pulse: 66 68  Resp: 16 17  Temp: 98.2 F (36.8 C) 98 F (36.7 C)  SpO2: 100% 97%   Body mass index is 21.8 kg/m.  General:  WDWN in NAD Gait: Not observed HENT: WNL, normocephalic Pulmonary: normal non-labored breathing, without Rales, rhonchi,   wheezing Cardiac: regular, without  Murmurs, rubs or gallops; without carotid bruits Abdomen: Positive bowel sounds throughout, soft, NT/ND, no masses Skin: without rashes Vascular Exam/Pulses: Unable to palpate or doppler Popliteal, Dorsalis Pedis or postibial artery pulses in left lower extremity. Left lower extremity cool to touch. Left Lower heel with open wound. Right lower extremity warm to touch. Positive doppler DP/PT  Extremities: with ischemic changes, with Gangrene , with cellulitis; with open wounds;  Musculoskeletal: no muscle wasting or atrophy  Neurologic: A&O X 3;  No focal weakness or paresthesias are detected; speech is fluent/normal Psychiatric:  The pt has Normal affect. Lymph:  Unremarkable  CBC    Component Value Date/Time   WBC 6.8 04/29/2023 0557   RBC 2.75 (L) 04/29/2023 0557   HGB 8.4 (L) 04/29/2023 0557   HGB 8.6 (L) 09/25/2013 0410   HCT 26.4 (L) 04/29/2023 0557   HCT 27.0 (L) 09/24/2013 0409   PLT 319 04/29/2023 0557   PLT 192 09/24/2013 0409   MCV 96.0 04/29/2023 0557   MCV 96 09/24/2013 0409   MCH 30.5 04/29/2023 0557   MCHC 31.8 04/29/2023 0557   RDW 13.7 04/29/2023 0557   RDW 13.8 09/24/2013 0409   LYMPHSABS 2.3 04/28/2023 1532   LYMPHSABS 2.5 09/24/2013 0409   MONOABS 0.5 04/28/2023 1532   MONOABS 0.7 09/24/2013 0409   EOSABS 0.5 04/28/2023 1532   EOSABS 0.4 09/24/2013 0409   BASOSABS 0.0 04/28/2023 1532   BASOSABS 0.0 09/24/2013 0409    BMET    Component Value Date/Time   NA 135 04/29/2023 0557   NA 143 09/24/2013 0409   K 4.0 04/29/2023 0557   K 3.6 09/24/2013 0409   CL 107 04/29/2023 0557   CL 108 (H) 09/24/2013 0409   CO2 25 04/29/2023 0557   CO2 27 09/24/2013 0409   GLUCOSE 95 04/29/2023 0557   GLUCOSE 222 (H) 09/24/2013 0409   BUN 29 (H) 04/29/2023 0557   BUN 7 09/24/2013 0409   CREATININE 0.53 04/29/2023 0557   CREATININE 0.77 09/24/2013 0409   CALCIUM 8.3 (L) 04/29/2023 0557   CALCIUM 7.7 (L) 09/24/2013 0409   GFRNONAA  >60 04/29/2023 0557   GFRNONAA >60 09/24/2013 0409   GFRAA >60 09/26/2019 1952   GFRAA >60  09/24/2013 0409    COAGS: Lab Results  Component Value Date   INR 1.3 (H) 04/29/2023   INR 1.2 08/26/2022   INR 1.2 08/08/2022     Non-Invasive Vascular Imaging:   EXAM:04/10/23 NONINVASIVE PHYSIOLOGIC VASCULAR STUDY OF BILATERAL LOWER EXTREMITIES   TECHNIQUE: Evaluation of both lower extremities were performed at rest, including calculation of ankle-brachial indices with single level Doppler, pressure and pulse volume recording.   COMPARISON:  None Available.   FINDINGS: Right ABI:  1.09   Left ABI:  0.38   Right Lower Extremity:  Irregular waveforms in the right ankle.   Left Lower Extremity:  Very irregular waveforms in the left ankle.   < 0.5 Severe PAD   IMPRESSION: 1. Severe arterial disease in the left lower extremity. Left ABI is 0.38. 2. Normal right ankle-brachial index.      Statin:  Yes.   Beta Blocker:  No. Aspirin:  Yes.   ACEI:  Yes.   ARB:  No. CCB use:  No Other antiplatelets/anticoagulants:  Yes.   Plavix 75 mg Daily   ASSESSMENT/PLAN: This is a 89 y.o. female who was sent to the emergency department with concerns for progressive gangrene to her left lower extremity.  She was noted to have severe arterial disease from a left ABI that is 0.38.  On exam this morning patient is severely confused to name only.  Currently in the care of a nursing home facility with a guardianship by the state.  The Department of Social Services providing guardianship is Ms. [name].  Vascular surgery plans on taking the patient to the vascular lab sometime this week for a left lower extremity angiogram with possible intervention.  I discussed in detail with the guardianship of Ms. [name] and [facility] DSS supervisor Candace Gobel the procedure, benefits, risk, and complications.  They both verbalized her understanding and give authorization and consent to  proceed with a left lower extremity angiogram with possible intervention.  I answered all their questions this morning.  I informed that the patient will be made n.p.o. the midnight before the schedule of the procedure.   -I discussed the case in detail with Dr. Festus Barren MD and he agrees with the plan   Marcie Bal [facility] 04/29/2023 7:13 AM

## 2023-04-29 NOTE — Consult Note (Signed)
 PODIATRY / FOOT AND ANKLE SURGERY CONSULTATION NOTE  Requesting Physician: Dr. Lyn Hollingshead  Reason for consult: L foot gangrene   HPI: [name] is a 89 y.o. female who presents with gangrene present to the left great toe extending to the first metatarsal phalange joint.  She was seen by Dr. Ether Griffins yesterday in outpatient clinic was noted to have signs consistent with possibly worsening gangrene or wet gangrene.  Patient was sent to the hospital for admission.  Dr. Ether Griffins believe that she needs vascular workup and partial first ray amputation versus more proximal amputation.  Patient presents today resting in bed comfortably eating lunch.  Patient has severe dementia so it is difficult to obtain history.  Patient has had gangrene to this area now for a number of months and has been monitored as outpatient.  PMHx:  Past Medical History:  Diagnosis Date   Carpal tunnel syndrome on both sides    Diabetes (HCC)    Diabetic polyneuropathy (HCC)    Diverticulitis    Hyperlipidemia    Hypertension    Osteoarthritis    Vaginal prolapse     Surgical Hx:  Past Surgical History:  Procedure Laterality Date   AMPUTATION TOE Left 08/13/2022   Procedure: EXCISION OF 5TH RAY;  Surgeon: Gwyneth Revels, DPM;  Location: [facility];  Service: Orthopedics/Podiatry;  Laterality: Left;   CESAREAN SECTION     IRRIGATION AND DEBRIDEMENT FOOT Left 01/09/2022   Procedure: IRRIGATION AND DEBRIDEMENT FOOT WITH BONE BIOPSY;  Surgeon: Gwyneth Revels, DPM;  Location: [facility];  Service: Podiatry;  Laterality: Left;   LOWER EXTREMITY ANGIOGRAPHY Left 06/11/2021   Procedure: Lower Extremity Angiography;  Surgeon: Renford Dills, MD;  Location: [facility];  Service: Cardiovascular;  Laterality: Left;   LOWER EXTREMITY ANGIOGRAPHY Left 08/11/2022   Procedure: Lower Extremity Angiography;  Surgeon: Renford Dills, MD;  Location: [facility];  Service: Cardiovascular;  Laterality: Left;   pessary      REPLACEMENT TOTAL KNEE BILATERAL     VENTRAL HERNIA REPAIR     VENTRAL HERNIA REPAIR N/A 01/13/2016   Procedure: HERNIA REPAIR VENTRAL ADULT WITH SMALL BOWEL RESECTION, LYSIS OF ADHESIONS;  Surgeon: Gladis Riffle, MD;  Location: [facility];  Service: General;  Laterality: N/A;    FHx:  Family History  Problem Relation Age of Onset   Breast cancer Mother 107   Breast cancer Maternal Aunt     Social History:  reports that she has never smoked. She has never used smokeless tobacco. She reports that she does not drink alcohol and does not use drugs.  Allergies:  Allergies  Allergen Reactions   Metformin And Related Nausea And Vomiting    Medications Prior to Admission  Medication Sig Dispense Refill   acetaminophen (TYLENOL) 325 MG tablet Take 2 tablets (650 mg total) by mouth every 6 (six) hours as needed for mild pain, moderate pain, fever or headache. (Patient taking differently: Take 650 mg by mouth every 6 (six) hours as needed for mild pain (pain score 1-3), moderate pain (pain score 4-6), fever or headache. PRN) 60 tablet 0   Amino Acids-Protein Hydrolys (FEEDING SUPPLEMENT, PRO-STAT SUGAR FREE 64,) LIQD Take 30 mLs by mouth in the morning and at bedtime.     ascorbic acid (VITAMIN C) 500 MG tablet Take 500 mg by mouth 2 (two) times daily.     aspirin EC 81 MG tablet Take 81 mg by mouth daily. Swallow whole.  atorvastatin (LIPITOR) 80 MG tablet Take 1 tablet (80 mg total) by mouth daily. (Patient taking differently: Take 80 mg by mouth at bedtime.) 30 tablet 0   clopidogrel (PLAVIX) 75 MG tablet Take 1 tablet (75 mg total) by mouth daily. 30 tablet 3   cyanocobalamin (VITAMIN B12) 1000 MCG tablet Take 1,000 mcg by mouth daily.     ferrous sulfate 325 (65 FE) MG tablet Take 325 mg by mouth every other day.     Glucagon, rDNA, (GLUCAGON EMERGENCY) 1 MG KIT Inject 1 mg into the muscle once as needed (low blood sugar).     hydrALAZINE (APRESOLINE) 25 MG tablet Take 25 mg by  mouth daily as needed (SBP> 160). PRN     insulin aspart (NOVOLOG) 100 UNIT/ML injection Inject 2-10 Units into the skin 3 (three) times daily before meals. As directed depending on blood sugar     insulin glargine (SEMGLEE) 100 UNIT/ML injection Inject 10 Units into the skin daily.     lisinopril (ZESTRIL) 20 MG tablet Take 20 mg by mouth daily.     magnesium oxide (MAG-OX) 400 (240 Mg) MG tablet Take 400 mg by mouth daily.     Multiple Vitamins-Minerals (MULTIVITAMIN WITH MINERALS) tablet Take 1 tablet by mouth daily.     naloxone (NARCAN) nasal spray 4 mg/0.1 mL Place 1 spray into the nose 3 (three) times daily as needed (opioid overodse).     oxyCODONE (OXY IR/ROXICODONE) 5 MG immediate release tablet Take 1 tablet (5 mg total) by mouth 3 (three) times daily. 30 tablet 0   polyethylene glycol (MIRALAX / GLYCOLAX) 17 g packet Take 17 g by mouth daily.     Vitamin D, Ergocalciferol, (DRISDOL) 1.25 MG (50000 UNIT) CAPS capsule Take 50,000 Units by mouth every 7 (seven) days. thursday      Physical Exam: General: Alert and oriented.  No apparent distress.  Vascular: Left DP/PT pulses nonpalpable, capillary fill time absent to the left hallux to the first metatarsal phalange joint level, no hair growth present to the bilateral lower extremities.  Neuro: Light touch sensation diminished to bilateral lower extremities.  Derm: Left hallux appears to be gangrenous to the IPJ but does appear to have some gangrenous changes now extending to the first metatarsal phalange joint level.  MSK: Patient is nonambulatory so muscle strength is difficult to assess.  Left leg is contracted in a flexed position at the knee and hip and abducted.  Results for orders placed or performed during the hospital encounter of 04/28/23 (from the past 48 hours)  Comprehensive metabolic panel     Status: Abnormal   Collection Time: 04/28/23  3:32 PM  Result Value Ref Range   Sodium 139 135 - 145 mmol/L   Potassium 4.7  3.5 - 5.1 mmol/L   Chloride 102 98 - 111 mmol/L   CO2 26 22 - 32 mmol/L   Glucose, Bld 175 (H) 70 - 99 mg/dL    Comment: Glucose reference range applies only to samples taken after fasting for at least 8 hours.   BUN 31 (H) 8 - 23 mg/dL   Creatinine, Ser 5.64 0.44 - 1.00 mg/dL   Calcium 9.5 8.9 - 33.2 mg/dL   Total Protein 7.6 6.5 - 8.1 g/dL   Albumin 3.6 3.5 - 5.0 g/dL   AST 26 15 - 41 U/L   ALT 15 0 - 44 U/L   Alkaline Phosphatase 65 38 - 126 U/L   Total Bilirubin 0.5 0.0 - 1.2 mg/dL  GFR, Estimated >60 >60 mL/min    Comment: (NOTE) Calculated using the CKD-EPI Creatinine Equation (2021)    Anion gap 11 5 - 15    Comment: Performed at [facility], [address] [place], [place] [address]  CBC with Differential     Status: Abnormal   Collection Time: 04/28/23  3:32 PM  Result Value Ref Range   WBC 8.6 4.0 - 10.5 K/uL   RBC 3.34 (L) 3.87 - 5.11 MIL/uL   Hemoglobin 10.2 (L) 12.0 - 15.0 g/dL   HCT 60.4 (L) 54.0 - 98.1 %   MCV 98.2 80.0 - 100.0 fL   MCH 30.5 26.0 - 34.0 pg   MCHC 31.1 30.0 - 36.0 g/dL   RDW 19.1 47.8 - 29.5 %   Platelets 457 (H) 150 - 400 K/uL   nRBC 0.0 0.0 - 0.2 %   Neutrophils Relative % 62 %   Neutro Abs 5.3 1.7 - 7.7 K/uL   Lymphocytes Relative 26 %   Lymphs Abs 2.3 0.7 - 4.0 K/uL   Monocytes Relative 6 %   Monocytes Absolute 0.5 0.1 - 1.0 K/uL   Eosinophils Relative 5 %   Eosinophils Absolute 0.5 0.0 - 0.5 K/uL   Basophils Relative 1 %   Basophils Absolute 0.0 0.0 - 0.1 K/uL   Immature Granulocytes 0 %   Abs Immature Granulocytes 0.03 0.00 - 0.07 K/uL    Comment: Performed at [facility], [address] [place], [place] [address]  Lactic acid, plasma     Status: Abnormal   Collection Time: 04/28/23  3:34 PM  Result Value Ref Range   Lactic Acid, Venous 2.1 (HH) 0.5 - 1.9 mmol/L    Comment: CRITICAL RESULT CALLED TO, READ BACK BY AND VERIFIED WITH ANGELA ROBBINS 04/28/23 1633 MU Performed at [facility], [address] [place], [place] [address]   Glucose, capillary     Status: Abnormal   Collection Time: 04/28/23  9:05 PM  Result Value Ref Range   Glucose-Capillary 154 (H) 70 - 99 mg/dL    Comment: Glucose reference range applies only to samples taken after fasting for at least 8 hours.   Comment 1 Notify RN   Lactic acid, plasma     Status: None   Collection Time: 04/28/23  9:07 PM  Result Value Ref Range   Lactic Acid, Venous 1.9 0.5 - 1.9 mmol/L    Comment: Performed at [facility], [address] [place], [place] [address]  APTT     Status: Abnormal   Collection Time: 04/29/23  5:57 AM  Result Value Ref Range   aPTT 40 (H) 24 - 36 seconds    Comment:        IF BASELINE aPTT IS ELEVATED, SUGGEST PATIENT RISK ASSESSMENT BE USED TO DETERMINE APPROPRIATE ANTICOAGULANT THERAPY. Performed at [facility] [address] [place], [place] [address]   Protime-INR     Status: Abnormal   Collection Time: 04/29/23  5:57 AM  Result Value Ref Range   Prothrombin Time 15.9 (H) 11.4 - 15.2 seconds   INR 1.3 (H) 0.8 - 1.2    Comment: (NOTE) INR goal varies based on device and disease states. Performed at [facility], [address] [place], [place] [address]   Basic metabolic panel     Status: Abnormal   Collection Time: 04/29/23  5:57 AM  Result Value Ref Range   Sodium 135 135 - 145 mmol/L   Potassium  4.0 3.5 - 5.1 mmol/L   Chloride 107 98 - 111 mmol/L   CO2 25 22 - 32 mmol/L   Glucose, Bld 95 70 - 99 mg/dL    Comment: Glucose reference range applies only to samples taken after fasting for at least 8 hours.   BUN 29 (H) 8 - 23 mg/dL   Creatinine, Ser 4.40 0.44 - 1.00 mg/dL   Calcium 8.3 (L) 8.9 - 10.3 mg/dL   GFR, Estimated >34 >74 mL/min    Comment: (NOTE) Calculated using the CKD-EPI Creatinine Equation (2021)    Anion gap 3 (L) 5 - 15    Comment: Performed at [facility], [address] [place], [place] [address]  CBC      Status: Abnormal   Collection Time: 04/29/23  5:57 AM  Result Value Ref Range   WBC 6.8 4.0 - 10.5 K/uL   RBC 2.75 (L) 3.87 - 5.11 MIL/uL   Hemoglobin 8.4 (L) 12.0 - 15.0 g/dL   HCT 38.7 (L) 56.4 - 33.2 %   MCV 96.0 80.0 - 100.0 fL   MCH 30.5 26.0 - 34.0 pg   MCHC 31.8 30.0 - 36.0 g/dL   RDW 95.1 88.4 - 16.6 %   Platelets 319 150 - 400 K/uL   nRBC 0.0 0.0 - 0.2 %    Comment: Performed at [facility], [address] [place], [place] [address]  Lactic acid, plasma     Status: None   Collection Time: 04/29/23  5:57 AM  Result Value Ref Range   Lactic Acid, Venous 0.7 0.5 - 1.9 mmol/L    Comment: Performed at [facility], [address] [place], [place] [address]  Glucose, capillary     Status: Abnormal   Collection Time: 04/29/23 12:26 PM  Result Value Ref Range   Glucose-Capillary 201 (H) 70 - 99 mg/dL    Comment: Glucose reference range applies only to samples taken after fasting for at least 8 hours.   DG Toe Great Left Result Date: 04/28/2023 CLINICAL DATA:  Gangrene in great toe EXAM: LEFT GREAT TOE COMPARISON:  None Available. FINDINGS: Extensive cortical erosion in the head of the proximal phalanx left great toe, with height loss more marked laterally than medially. Focal areas of bone loss at the lateral margin base distal phalanx. There is resultant lateral angulation of the distal phalanx. Extensive vascular site calcifications are noted in the distal foot. IMPRESSION: 1. Extensive septic arthritis and osteomyelitis of the great toe as above. Electronically Signed   By: Corlis Leak M.D.   On: 04/28/2023 19:58    Blood pressure (!) 153/58, pulse 82, temperature 97.7 F (36.5 C), temperature source Oral, resp. rate 12, height 5\' 4"  (1.626 m), weight 57.6 kg, SpO2 100%.  Assessment Gangrene left forefoot with likely osteomyelitis left great toe PVD Diabetes type 2 apply neuropathy  Plan -Patient seen and examined. -Patient appears to have gangrene  present to the left hallux which appears to be fairly stable still at this time.  Patient displays no systemic signs of infection clinically or with lab work. -Plan for angiogram tomorrow with vascular.  If they deem she has enough circulation to heal a partial first ray amputation then we will likely proceed on Friday.  Will discuss with DSS tomorrow.  Patient is at risk for limb loss as if she has a partial first ray amputation of the left foot and it does not heal then it will likely require an AKA.  Patient  may be more suited for AKA due to left lower extremity contracture as was discussed previously on previous admission. -Continue Betadine wet-to-dry dressings daily to the left foot. -Prevalon boots ordered for bilateral lower extremities.  Rosetta Posner, DPM 04/29/2023, 12:49 PM

## 2023-04-29 NOTE — Plan of Care (Signed)
  Problem: Skin Integrity: Goal: Risk for impaired skin integrity will decrease Outcome: Progressing   Problem: Coping: Goal: Level of anxiety will decrease Outcome: Progressing

## 2023-04-29 NOTE — Progress Notes (Signed)
 Initial Nutrition Assessment  DOCUMENTATION CODES:   Severe malnutrition in context of social or environmental circumstances  INTERVENTION:   Glucerna Shake po TID, each supplement provides 220 kcal and 10 grams of protein  MVI po daily  Vitamin C 500mg  po BID  Pt at high refeed risk; recommend monitor potassium, magnesium and phosphorus labs daily until stable  Liberalize diet   Daily weights   NUTRITION DIAGNOSIS:   Severe Malnutrition related to social / environmental circumstances as evidenced by severe fat depletion, severe muscle depletion, 15 percent weight loss in 9 months.  GOAL:   Patient will meet greater than or equal to 90% of their needs  MONITOR:   PO intake, Supplement acceptance, Weight trends, Labs, I & O's, Skin  REASON FOR ASSESSMENT:   Malnutrition Screening Tool    ASSESSMENT:   89 y/o female with h/o ventral hernia (s/p repair, lysis of adhesions with small bowel resection (8.6cm) 2017), DM, HLD, HTN, CAD, PVD, polyneuropathy, CVA with hemiparesis, DJD and dementia who is admitted with toe gangrene.  Met with pt in room today. Pt with dementia and only able to provide limited history. Pt reports fair appetite and oral intake. Pt reports that she ate well today and drank an Ensure but per RN, patient has not had any supplements. Pt is documented to have eaten 50% of lunch today. RD will add supplements and vitamins to support wound healing and help pt meet her estimated needs. Pt is at high refeed risk. Per chart, pt is down 23lbs(15%) over the past 9 months; this is significant weight loss. Plan is for angiogram tomorrow.   Medications reviewed and include: vitamin C, aspirin, plavix, lovenox, ferrous sulfate, insulin, B12, Mg oxide, MVI, miralax, zosyn, vancomycin   Labs reviewed: K 4.0 wnl, BUN 29(H) Hgb 8.4(L), Hct 26.4(L) Cbgs- 201, 154 x 48 hrs  AIC 6.2(H)- 2/28  NUTRITION - FOCUSED PHYSICAL EXAM:  Flowsheet Row Most Recent Value   Orbital Region Moderate depletion  Upper Arm Region Severe depletion  Thoracic and Lumbar Region Severe depletion  Buccal Region Moderate depletion  Temple Region Severe depletion  Clavicle Bone Region Severe depletion  Clavicle and Acromion Bone Region Severe depletion  Scapular Bone Region Severe depletion  Dorsal Hand Severe depletion  Patellar Region Severe depletion  Anterior Thigh Region Severe depletion  Posterior Calf Region Severe depletion  Edema (RD Assessment) None  Hair Reviewed  Eyes Reviewed  Mouth Reviewed  Skin Reviewed  Nails Reviewed   Diet Order:   Diet Order             Diet NPO time specified Except for: Sips with Meds  Diet effective midnight           Diet heart healthy/carb modified Room service appropriate? Yes; Fluid consistency: Thin  Diet effective now                  EDUCATION NEEDS:   No education needs have been identified at this time  Skin:  Skin Assessment: Reviewed RN Assessment (L toe gangrene)  Last BM:  3/19- type 2  Height:   Ht Readings from Last 1 Encounters:  04/28/23 5\' 4"  (1.626 m)    Weight:   Wt Readings from Last 1 Encounters:  04/28/23 57.6 kg    Ideal Body Weight:  54.5 kg  BMI:  Body mass index is 21.8 kg/m.  Estimated Nutritional Needs:   Kcal:  1400-1600kcal/day  Protein:  70-80g/day  Fluid:  1.4-1.6L/day  Baird Lyons  [name] MS, RD, LDN If unable to be reached, please send secure chat to "RD inpatient" available from 8:00a-4:00p daily

## 2023-04-30 ENCOUNTER — Encounter
Admission: EM | Disposition: A | Discharge status: Skilled Nursing Facility | Payer: Self-pay | Source: Ambulatory Visit | Attending: Osteopathic Medicine

## 2023-04-30 DIAGNOSIS — E43 Unspecified severe protein-calorie malnutrition: Secondary | ICD-10-CM | POA: Insufficient documentation

## 2023-04-30 DIAGNOSIS — T82856A Stenosis of peripheral vascular stent, initial encounter: Secondary | ICD-10-CM | POA: Diagnosis not present

## 2023-04-30 DIAGNOSIS — Z96652 Presence of left artificial knee joint: Secondary | ICD-10-CM | POA: Diagnosis not present

## 2023-04-30 DIAGNOSIS — M24562 Contracture, left knee: Secondary | ICD-10-CM

## 2023-04-30 DIAGNOSIS — I96 Gangrene, not elsewhere classified: Secondary | ICD-10-CM | POA: Diagnosis not present

## 2023-04-30 DIAGNOSIS — I771 Stricture of artery: Secondary | ICD-10-CM | POA: Diagnosis not present

## 2023-04-30 DIAGNOSIS — I70262 Atherosclerosis of native arteries of extremities with gangrene, left leg: Secondary | ICD-10-CM | POA: Diagnosis not present

## 2023-04-30 DIAGNOSIS — Z9889 Other specified postprocedural states: Secondary | ICD-10-CM

## 2023-04-30 HISTORY — PX: LOWER EXTREMITY ANGIOGRAPHY: CATH118251

## 2023-04-30 LAB — GLUCOSE, CAPILLARY
Glucose-Capillary: 93 mg/dL (ref 70–99)
Glucose-Capillary: 96 mg/dL (ref 70–99)
Glucose-Capillary: 77 mg/dL (ref 70–99)
Glucose-Capillary: 79 mg/dL (ref 70–99)
Glucose-Capillary: 55 mg/dL — ABNORMAL LOW (ref 70–99)
Glucose-Capillary: 156 mg/dL — ABNORMAL HIGH (ref 70–99)
Glucose-Capillary: 53 mg/dL — ABNORMAL LOW (ref 70–99)
Glucose-Capillary: 57 mg/dL — ABNORMAL LOW (ref 70–99)
Glucose-Capillary: 57 mg/dL — ABNORMAL LOW (ref 70–99)
Glucose-Capillary: 167 mg/dL — ABNORMAL HIGH (ref 70–99)

## 2023-04-30 LAB — CBC
HCT: 27.1 % — ABNORMAL LOW (ref 36.0–46.0)
Hemoglobin: 8.5 g/dL — ABNORMAL LOW (ref 12.0–15.0)
MCH: 31.3 pg (ref 26.0–34.0)
MCHC: 31.4 g/dL (ref 30.0–36.0)
MCV: 99.6 fL (ref 80.0–100.0)
Platelets: 280 10*3/uL (ref 150–400)
RBC: 2.72 MIL/uL — ABNORMAL LOW (ref 3.87–5.11)
RDW: 13.9 % (ref 11.5–15.5)
WBC: 7.9 10*3/uL (ref 4.0–10.5)
nRBC: 0 % (ref 0.0–0.2)

## 2023-04-30 LAB — BASIC METABOLIC PANEL
Anion gap: 7 (ref 5–15)
BUN: 30 mg/dL — ABNORMAL HIGH (ref 8–23)
CO2: 25 mmol/L (ref 22–32)
Calcium: 8.6 mg/dL — ABNORMAL LOW (ref 8.9–10.3)
Chloride: 106 mmol/L (ref 98–111)
Creatinine, Ser: 0.67 mg/dL (ref 0.44–1.00)
GFR, Estimated: >60 mL/min (ref >60)
Glucose, Bld: 100 mg/dL — ABNORMAL HIGH (ref 70–99)
Potassium: 4.3 mmol/L (ref 3.5–5.1)
Sodium: 138 mmol/L (ref 135–145)

## 2023-04-30 LAB — PHOSPHORUS: Phosphorus: 2.8 mg/dL (ref 2.5–4.6)

## 2023-04-30 LAB — MAGNESIUM: Magnesium: 2 mg/dL (ref 1.7–2.4)

## 2023-04-30 SURGERY — LOWER EXTREMITY ANGIOGRAPHY
Anesthesia: Moderate Sedation | Laterality: Left

## 2023-04-30 MED ORDER — CEFAZOLIN SODIUM-DEXTROSE 2-4 GM/100ML-% IV SOLN
2.0000 g | INTRAVENOUS | Status: DC
  Filled 2023-04-30: qty 100

## 2023-04-30 MED ORDER — MUPIROCIN 2 % EX OINT
1.0000 | TOPICAL_OINTMENT | Freq: Two times a day (BID) | CUTANEOUS | Status: AC
  Administered 2023-04-30 – 2023-05-04 (×10): 1 via NASAL
  Filled 2023-04-30: qty 22

## 2023-04-30 MED ORDER — MIDAZOLAM HCL 2 MG/2ML IJ SOLN
INTRAMUSCULAR | Status: AC
  Filled 2023-04-30: qty 2

## 2023-04-30 MED ORDER — FAMOTIDINE 20 MG PO TABS: 40.0000 mg | ORAL_TABLET | Freq: Once | ORAL | Status: DC | PRN

## 2023-04-30 MED ORDER — LIDOCAINE-EPINEPHRINE (PF) 1 %-1:200000 IJ SOLN
INTRAMUSCULAR | Status: DC | PRN
  Administered 2023-04-30: 10 mL

## 2023-04-30 MED ORDER — FENTANYL CITRATE PF 50 MCG/ML IJ SOSY
PREFILLED_SYRINGE | INTRAMUSCULAR | Status: AC
  Filled 2023-04-30: qty 1

## 2023-04-30 MED ORDER — METHYLPREDNISOLONE SODIUM SUCC 125 MG IJ SOLR: 125.0000 mg | Freq: Once | INTRAMUSCULAR | Status: DC | PRN

## 2023-04-30 MED ORDER — FENTANYL CITRATE (PF) 100 MCG/2ML IJ SOLN
INTRAMUSCULAR | Status: AC
  Filled 2023-04-30: qty 2

## 2023-04-30 MED ORDER — MIDAZOLAM HCL 2 MG/2ML IJ SOLN
INTRAMUSCULAR | Status: DC | PRN
  Administered 2023-04-30 (×6): 1 mg via INTRAVENOUS

## 2023-04-30 MED ORDER — HEPARIN (PORCINE) IN NACL 1000-0.9 UT/500ML-% IV SOLN
INTRAVENOUS | Status: DC | PRN
  Administered 2023-04-30: 500 mL

## 2023-04-30 MED ORDER — MIDAZOLAM HCL 2 MG/ML PO SYRP: 8.0000 mg | ORAL_SOLUTION | Freq: Once | ORAL | Status: DC | PRN

## 2023-04-30 MED ORDER — OXYCODONE HCL 5 MG PO TABS
ORAL_TABLET | ORAL | Status: AC
  Filled 2023-04-30: qty 1

## 2023-04-30 MED ORDER — SODIUM CHLORIDE 0.9 % IV SOLN: INTRAVENOUS | Status: DC

## 2023-04-30 MED ORDER — VANCOMYCIN HCL 750 MG/150ML IV SOLN
750.0000 mg | INTRAVENOUS | Status: DC
  Administered 2023-05-01: 750 mg via INTRAVENOUS
  Filled 2023-04-30: qty 150

## 2023-04-30 MED ORDER — HEPARIN SODIUM (PORCINE) 1000 UNIT/ML IJ SOLN
INTRAMUSCULAR | Status: DC | PRN
  Administered 2023-04-30: 4000 [IU] via INTRAVENOUS

## 2023-04-30 MED ORDER — HEPARIN SODIUM (PORCINE) 1000 UNIT/ML IJ SOLN
INTRAMUSCULAR | Status: AC
Start: 2023-04-30 — End: ?
  Filled 2023-04-30: qty 10

## 2023-04-30 MED ORDER — FENTANYL CITRATE (PF) 100 MCG/2ML IJ SOLN
INTRAMUSCULAR | Status: DC | PRN
  Administered 2023-04-30 (×2): 25 ug via INTRAVENOUS
  Administered 2023-04-30: 50 ug via INTRAVENOUS
  Administered 2023-04-30 (×4): 25 ug via INTRAVENOUS

## 2023-04-30 MED ORDER — LABETALOL HCL 5 MG/ML IV SOLN
INTRAVENOUS | Status: DC | PRN
  Administered 2023-04-30: 10 mg via INTRAVENOUS

## 2023-04-30 MED ORDER — FENTANYL CITRATE PF 50 MCG/ML IJ SOSY
PREFILLED_SYRINGE | INTRAMUSCULAR | Status: AC
Start: 2023-04-30 — End: ?
  Filled 2023-04-30: qty 1

## 2023-04-30 MED ORDER — DIPHENHYDRAMINE HCL 50 MG/ML IJ SOLN: 50.0000 mg | Freq: Once | INTRAMUSCULAR | Status: DC | PRN

## 2023-04-30 MED ORDER — FENTANYL CITRATE PF 50 MCG/ML IJ SOSY: 12.5000 ug | PREFILLED_SYRINGE | Freq: Once | INTRAMUSCULAR | Status: DC | PRN

## 2023-04-30 MED ORDER — LABETALOL HCL 5 MG/ML IV SOLN
INTRAVENOUS | Status: AC
  Filled 2023-04-30: qty 4

## 2023-04-30 MED ORDER — CHLORHEXIDINE GLUCONATE CLOTH 2 % EX PADS
6.0000 | MEDICATED_PAD | Freq: Every day | CUTANEOUS | Status: DC
  Administered 2023-04-30 – 2023-05-02 (×3): 6 via TOPICAL

## 2023-04-30 MED ORDER — DEXTROSE 50 % IV SOLN
25.0000 g | Freq: Once | INTRAVENOUS | Status: AC
  Administered 2023-04-30: 25 g via INTRAVENOUS
  Filled 2023-04-30: qty 50

## 2023-04-30 MED ORDER — MIDAZOLAM HCL 2 MG/2ML IJ SOLN
INTRAMUSCULAR | Status: AC
  Filled 2023-04-30: qty 4

## 2023-04-30 MED ORDER — IODIXANOL 320 MG/ML IV SOLN
INTRAVENOUS | Status: DC | PRN
  Administered 2023-04-30: 50 mL

## 2023-04-30 SURGICAL SUPPLY — 19 items
BALLN LUTONIX 018 5X300X130 (BALLOONS) ×1 IMPLANT
BALLN ULTRVRSE 018 2.5X150X150 (BALLOONS) ×1 IMPLANT
BALLN ULTRVRSE 3X150X150 (BALLOONS) ×1 IMPLANT
BALLOON LUTONIX 018 5X300X130 (BALLOONS) IMPLANT
BALLOON ULTRVRSE 3X150X150 (BALLOONS) IMPLANT
BALLOON ULTRVS 018 2.5X150X150 (BALLOONS) IMPLANT
CATH ANGIO 5F PIGTAIL 65CM (CATHETERS) IMPLANT
CATH BEACON 5 .038 100 VERT TP (CATHETERS) IMPLANT
COVER PROBE ULTRASOUND 5X96 (MISCELLANEOUS) IMPLANT
DEVICE PRESTO INFLATION (MISCELLANEOUS) IMPLANT
DEVICE STARCLOSE SE CLOSURE (Vascular Products) IMPLANT
GLIDEWIRE ADV .035X260CM (WIRE) IMPLANT
PACK ANGIOGRAPHY (CUSTOM PROCEDURE TRAY) ×1 IMPLANT
SHEATH ANL2 6FRX45 HC (SHEATH) IMPLANT
SHEATH BRITE TIP 5FRX11 (SHEATH) IMPLANT
SYR MEDRAD MARK 7 150ML (SYRINGE) IMPLANT
TUBING CONTRAST HIGH PRESS 72 (TUBING) IMPLANT
WIRE G V18X300CM (WIRE) IMPLANT
WIRE J 3MM .035X145CM (WIRE) IMPLANT

## 2023-04-30 NOTE — Interval H&P Note (Signed)
 History and Physical Interval Note:  04/30/2023 2:32 PM  [name]  has presented today for surgery, with the diagnosis of PAD.  The various methods of treatment have been discussed with the patient and family. After consideration of risks, benefits and other options for treatment, the patient has consented to  Procedure(s): Lower Extremity Angiography (Left) as a surgical intervention.  The patient's history has been reviewed, patient examined, no change in status, stable for surgery.  I have reviewed the patient's chart and labs.  Questions were answered to the patient's satisfaction.     Festus Barren

## 2023-04-30 NOTE — Progress Notes (Signed)
       CROSS COVER NOTE  NAME: LE FERRAZ MRN: [id number] DOB : 1933/11/29 ATTENDING PHYSICIAN: Sunnie Nielsen, DO    Date of Service   04/30/2023   HPI/Events of Note   Paged by RN regarding bleeding at angiogram site.  Was immediately advised to continue holding manual pressure and page vascular surgery as well  Interventions   Assessment/Plan: Bedside - sit now with clot evident under post procedure dressing, no hematoma/firmness present. Site dressing reinforced  Cbc stat - hgb stable 8.5, prior on 3/29 8.4        Donnie Mesa NP [facility] Cross Cover 7pm-7am - check amion for availability Pager [phone]

## 2023-04-30 NOTE — Plan of Care (Signed)
  Problem: Pain Managment: Goal: General experience of comfort will improve and/or be controlled Outcome: Progressing   Problem: Safety: Goal: Ability to remain free from injury will improve Outcome: Progressing   Problem: Skin Integrity: Goal: Risk for impaired skin integrity will decrease Outcome: Progressing

## 2023-04-30 NOTE — Progress Notes (Addendum)
 Discussed CTA results with Dr. Wyn Quaker.  He believes she should have enough circulation to undergo a partial L 1st ray amputation.  Discussed case with guardian, Vilinda Blanks, over the phone.  Patient is at risk for limb loss if surgical failure occurs.  Discussed benefits, risks, and complications.  She was agreeable and consented for the procedure.  Will have nursing staff reach out for formal consent.  NPO at 0800 tomorrow for surgery at 4pm.

## 2023-04-30 NOTE — Op Note (Signed)
 [facility]  Percutaneous Study/Intervention Procedural Note   Date of Surgery: 04/30/2023  Surgeon(s):Malik Ruffino    Assistants:none  Pre-operative Diagnosis: PAD with gangrene LLE  Post-operative diagnosis:  Same  Procedure(s) Performed:             1.  Ultrasound guidance for vascular access right femoral artery             2.  Catheter placement into left common femoral artery from right femoral approach             3.  Aortogram and selective left lower extremity angiogram             4.  Percutaneous transluminal angioplasty of left anterior tibial artery with 2.5 mm diameter angioplasty balloon in the midsegment and 3 mm diameter angioplasty balloon in the proximal segment in the tibioperoneal trunk             5.  Percutaneous transluminal angioplasty of left SFA and popliteal artery with 5 mm diameter by 30 cm length Lutonix drug-coated angioplasty balloon  6.  StarClose closure device right femoral artery  EBL: 5 cc  Contrast: 50 cc  Fluoro Time: 6.2 minutes  Moderate Conscious Sedation Time: approximately 63 minutes using 6 mg of Versed and 200 mcg of Fentanyl              Indications:  Patient is a 89 y.o.female with a long history of peripheral arterial disease now again with nonpalpable pulses and gangrenous changes in the left foot. The patient is brought in for angiography for further evaluation and potential treatment.  Due to the limb threatening nature of the situation, angiogram was performed for attempted limb salvage. The patient is aware that if the procedure fails, amputation would be expected.  The patient also understands that even with successful revascularization, amputation may still be required due to the severity of the situation.  Risks and benefits are discussed and informed consent is obtained.   Procedure:  The patient was identified and appropriate procedural time out was performed.  The patient was then placed supine on the  table and prepped and draped in the usual sterile fashion. Moderate conscious sedation was administered during a face to face encounter with the patient throughout the procedure with my supervision of the RN administering medicines and monitoring the patient's vital signs, pulse oximetry, telemetry and mental status throughout from the start of the procedure until the patient was taken to the recovery room. Ultrasound was used to evaluate the right common femoral artery.  It was patent .  A digital ultrasound image was acquired.  A Seldinger needle was used to access the right common femoral artery under direct ultrasound guidance and a permanent image was performed.  A 0.035 J wire was advanced without resistance and a 5Fr sheath was placed.  Pigtail catheter was placed into the aorta and an AP aortogram was performed. This demonstrated normal renal arteries and normal aorta and iliac segments without significant stenosis although they were markedly tortuous. I then crossed the aortic bifurcation and advanced to the left femoral head. Selective left lower extremity angiogram was then performed. This demonstrated markedly calcific vessels with no hemodynamically significant stenosis in the left common femoral artery, profunda femoris artery, or proximal superficial femoral artery.  The distal SFA had been previously stented and there was severe hyperplasia within the stent that carried down to the distal popliteal artery.  There were multiple areas within the stent in  the SFA and popliteal arteries of greater than 75% stenosis.  The tibioperoneal trunk and proximal posterior tibial artery with an occluded.  The posterior tibial artery appeared to be the only runoff distally.  Of note, due to the patient's contracture and continuous motion, image quality was extremely poor throughout the procedure and it was really difficult to discern if this was the posterior tibial or peroneal artery but appeared to be the  posterior tibial artery. It was felt that it was in the patient's best interest to proceed with intervention after these images to avoid a second procedure and a larger amount of contrast and fluoroscopy based off of the findings from the initial angiogram. The patient was systemically heparinized and a 6 [place] Ansell sheath was then placed over the [employer] wire. I then used a Kumpe catheter and the advantage wire to easily navigate through the SFA and popliteal disease and then crossed the occlusion in the tibioperoneal trunk and proximal posterior tibial artery.  Intraluminal flow was confirmed and I exchanged for a V18 wire.  I then proceeded with treatment.  The tibioperoneal trunk and proximal posterior tibial artery were treated with a 3 mm diameter by 15 cm length angioplasty balloon.  The mid posterior tibial artery was treated with a 2.5 mm diameter by 15 cm length angioplasty balloon.  These inflations were 8 to 10 atm for 1 minute.  The entire popliteal artery including the previously placed stents and the mid to distal superficial femoral artery up to and above the previously placed stent were treated with a 5 mm diameter by 30 cm length Lutonix drug-coated angioplasty balloon inflated to 10 atm for 1 minute.  Completion imaging again was difficult to visualize due to the continuous patient motion with her knee prostheses and contractures.  The stents now appear to be widely patent with less than 20% residual stenosis.  There was brisk flow through the posterior tibial and tibioperoneal trunk areas of treatment and good flow distally.  There did not appear to be any greater than 30% residual stenosis. I elected to terminate the procedure. The sheath was removed and StarClose closure device was deployed in the right femoral artery with excellent hemostatic result. The patient was taken to the recovery room in stable condition having tolerated the procedure well.  Findings:                Aortogram:  This demonstrated normal renal arteries and normal aorta and iliac segments without significant stenosis although they were markedly tortuous             Left Lower Extremity:  This demonstrated markedly calcific vessels with no hemodynamically significant stenosis in the left common femoral artery, profunda femoris artery, or proximal superficial femoral artery.  The distal SFA had been previously stented and there was severe hyperplasia within the stent that carried down to the distal popliteal artery.  There were multiple areas within the stent in the SFA and popliteal arteries of greater than 75% stenosis.  The tibioperoneal trunk and proximal posterior tibial artery with an occluded.  The posterior tibial artery appeared to be the only runoff distally.  Of note, due to the patient's contracture and continuous motion, image quality was extremely poor throughout the procedure and it was really difficult to discern if this was the posterior tibial or peroneal artery but appeared to be the posterior tibial artery.   Disposition: Patient was taken to the recovery room in stable condition having tolerated the  procedure well.  Complications: None  Festus Barren 04/30/2023 5:01 PM   This note was created with Dragon Medical transcription system. Any errors in dictation are purely unintentional.

## 2023-04-30 NOTE — Progress Notes (Signed)
 PROGRESS NOTE    CHERRE KOTHARI   QQV:[id number] DOB: 12-Jun-1933  DOA: 04/28/2023 Date of Service: 04/30/23 which is hospital day 2  PCP: Gracelyn Nurse, MD    Hospital course / significant events:   HPI: [name] is a 89 y.o. female with medical history significant of dementia and left hemiparesis with inability to meaningfully use left upper and lower extermties.type 2 diabetes, diabetic polyneuropathy, essential hypertension, dyslipidemia, peripheral arterial disease.  She is ordinarily a resident of nursing home facility.  Patient is also under the guardianship of [employer] represented by [name].  History limited due to patient's dementia. Patient apparently had been seen by podiatry as well as vascular surgery over the last wek prior to coming to the ED, and had been advised to come to the hospital due to gangrene of the left big toe and with the intent for amputation of the toe.  Patient finally presented to the ER 03/18.    03/18: admitted to hospitalist for gangrene, consulted podiatry and vascular surgery. Continued on Vanc, Zosyn. No sepsis.  03/19: plan for angio tomorrow  03/20: angiography planned today      Consultants:  Podiatry  Vascular Surgery   Procedures/Surgeries: 04/30/23 angiography pending today w/ Dr Wyn Quaker      ASSESSMENT & PLAN:   Toe gangrene L hallux No sepsis  This appears chronic/dry - vascular concerned about it turning wet. No sepsis  Vascular and Podiatry teams following angio today  If sufficient circulation to heal a partial first ray amputation then podiatry will likely proceed with this on Friday 03/21 Abx: vancomycin, Zosyn Pain control   Betadine wet-to-dry dressings daily to the left foot  Prevalon boots    Chronic pain:  oxycodone standing to 5 mg every 4 hours as needed. Bowel regimen ordered with MiraLAX   Prior documented peripheral arterial disease:  Aspirin, statin, Plavix   Diabetes  mellitus with neuropathy Insulin sliding scale   HTN  diastolic blood pressure is ranging from 49-59 at this time.   Reduce dose on Zestril at this time   Nutrition/Supplements Continue with feeding supplementation with Pro-Stat Continue with home vitamin B-12 ferrous sulfate and magnesium oxide    No concerns based on BMI: Body mass index is 21.8 kg/m.  Underweight - under 18  overweight - 25 to 29 obese - 30 or more Class 1 obesity: BMI of 30.0 to 34 Class 2 obesity: BMI of 35.0 to 39 Class 3 obesity: BMI of 40.0 to 49 Super Morbid Obesity: BMI 50-59 Super-super Morbid Obesity: BMI 60+ Significantly low or high BMI is associated with higher medical risk.  Weight management advised as adjunct to other disease management and risk reduction treatments    DVT prophylaxis: lovenox IV fluids: no continuous IV fluids  Nutrition: cardiac/carb diet  Central lines / other devices: none  Code Status: spoke w/ guardian [name] 8:09 AM 04/29/23 and confirmed FULL CODE unless there is decompensation / new clinical development such that CPR/resuscitation would be futile, will leave full code and will update guardian if there is strong recommendation to change this.  ACP documentation reviewed: none on file in [facility]  TOC needs: TBD but expect will need SNF/return to SNF w/ rehab  Medical barriers to dispo: vascular and podiatry following, planned for angio w/ likely amputation. Expected medical readiness for discharge in several days.              Subjective / Brief ROS:  Patient  reports no concerns or questions at this time    Family Communication:none at this time     Objective Findings:  Vitals:   04/29/23 2011 04/30/23 0148 04/30/23 0335 04/30/23 0917  BP: (!) 116/59  139/69 132/74  Pulse: 73  78 74  Resp: 16  16 16   Temp: 98.3 F (36.8 C)  98.4 F (36.9 C) 98.2 F (36.8 C)  TempSrc:      SpO2: 100%  99% 99%  Weight:  65.4 kg    Height:         Intake/Output Summary (Last 24 hours) at 04/30/2023 1422 Last data filed at 04/30/2023 1400 Gross per 24 hour  Intake 490.1 ml  Output 350 ml  Net 140.1 ml   Filed Weights   04/28/23 1456 04/30/23 0148  Weight: 57.6 kg 65.4 kg    Examination:  Physical Exam Constitutional:      General: She is not in acute distress.    Appearance: She is not ill-appearing.  Cardiovascular:     Rate and Rhythm: Normal rate and regular rhythm.  Pulmonary:     Effort: Pulmonary effort is normal.     Breath sounds: Normal breath sounds.  Abdominal:     Palpations: Abdomen is soft.  Neurological:     Mental Status: She is alert. Mental status is at baseline.  Psychiatric:        Mood and Affect: Mood normal.        Behavior: Behavior normal.          Scheduled Medications:   ascorbic acid  500 mg Oral BID   aspirin EC  81 mg Oral Daily   atorvastatin  80 mg Oral QHS   Chlorhexidine Gluconate Cloth  6 each Topical Daily   clopidogrel  75 mg Oral Daily   cyanocobalamin  1,000 mcg Oral Daily   enoxaparin (LOVENOX) injection  40 mg Subcutaneous Q24H   feeding supplement (GLUCERNA SHAKE)  237 mL Oral TID BM   feeding supplement (PRO-STAT SUGAR FREE 64)  30 mL Oral BID   ferrous sulfate  325 mg Oral QODAY   insulin aspart  0-15 Units Subcutaneous TID WC   insulin aspart  0-5 Units Subcutaneous QHS   lisinopril  10 mg Oral Daily   magnesium oxide  200 mg Oral Daily   multivitamin with minerals  1 tablet Oral Daily   mupirocin ointment  1 Application Nasal BID   polyethylene glycol  17 g Oral Daily   sodium chloride flush  3 mL Intravenous Q12H    Continuous Infusions:  sodium chloride      ceFAZolin (ANCEF) IV     piperacillin-tazobactam (ZOSYN)  IV 3.375 g (04/30/23 1320)   vancomycin Stopped (04/29/23 1810)    PRN Medications:  acetaminophen **OR** acetaminophen, diphenhydrAMINE, famotidine, fentaNYL (SUBLIMAZE) injection, methylPREDNISolone (SOLU-MEDROL) injection,  midazolam, mouth rinse, oxyCODONE, polyethylene glycol  Antimicrobials from admission:  Anti-infectives (From admission, onward)    Start     Dose/Rate Route Frequency Ordered Stop   04/30/23 1351  ceFAZolin (ANCEF) IVPB 2g/100 mL premix        2 g 200 mL/hr over 30 Minutes Intravenous 30 min pre-op 04/30/23 1351     04/29/23 1600  vancomycin (VANCOREADY) IVPB 750 mg/150 mL        750 mg 150 mL/hr over 60 Minutes Intravenous Every 24 hours 04/28/23 2017     04/29/23 0600  piperacillin-tazobactam (ZOSYN) IVPB 3.375 g        3.375  g 12.5 mL/hr over 240 Minutes Intravenous Every 8 hours 04/28/23 2017     04/28/23 2130  vancomycin (VANCOCIN) IVPB 1000 mg/200 mL premix        1,000 mg 200 mL/hr over 60 Minutes Intravenous  Once 04/28/23 2114 04/28/23 2346   04/28/23 1830  vancomycin (VANCOCIN) IVPB 1000 mg/200 mL premix  Status:  Discontinued        1,000 mg 200 mL/hr over 60 Minutes Intravenous  Once 04/28/23 1818 04/28/23 2114   04/28/23 1830  piperacillin-tazobactam (ZOSYN) IVPB 3.375 g        3.375 g 100 mL/hr over 30 Minutes Intravenous  Once 04/28/23 1818 04/28/23 1957           Data Reviewed:  I have personally reviewed the following...  CBC: Recent Labs  Lab 04/28/23 1532 04/29/23 0557  WBC 8.6 6.8  NEUTROABS 5.3  --   HGB 10.2* 8.4*  HCT 32.8* 26.4*  MCV 98.2 96.0  PLT 457* 319   Basic Metabolic Panel: Recent Labs  Lab 04/28/23 1532 04/29/23 0557 04/30/23 0521  NA 139 135 138  K 4.7 4.0 4.3  CL 102 107 106  CO2 26 25 25   GLUCOSE 175* 95 100*  BUN 31* 29* 30*  CREATININE 0.66 0.53 0.67  CALCIUM 9.5 8.3* 8.6*  MG  --   --  2.0  PHOS  --   --  2.8   GFR: Estimated Creatinine Clearance: 42.8 mL/min (by C-G formula based on SCr of 0.67 mg/dL). Liver Function Tests: Recent Labs  Lab 04/28/23 1532  AST 26  ALT 15  ALKPHOS 65  BILITOT 0.5  PROT 7.6  ALBUMIN 3.6   No results for input(s): "LIPASE", "AMYLASE" in the last 168 hours. No results  for input(s): "AMMONIA" in the last 168 hours. Coagulation Profile: Recent Labs  Lab 04/29/23 0557  INR 1.3*   Cardiac Enzymes: No results for input(s): "CKTOTAL", "CKMB", "CKMBINDEX", "TROPONINI" in the last 168 hours. BNP (last 3 results) No results for input(s): "PROBNP" in the last 8760 hours. HbA1C: No results for input(s): "HGBA1C" in the last 72 hours. CBG: Recent Labs  Lab 04/29/23 1226 04/29/23 1703 04/29/23 2044 04/30/23 0853 04/30/23 1157  GLUCAP 201* 105* 127* 93 96   Lipid Profile: No results for input(s): "CHOL", "HDL", "LDLCALC", "TRIG", "CHOLHDL", "LDLDIRECT" in the last 72 hours. Thyroid Function Tests: No results for input(s): "TSH", "T4TOTAL", "FREET4", "T3FREE", "THYROIDAB" in the last 72 hours. Anemia Panel: No results for input(s): "VITAMINB12", "FOLATE", "FERRITIN", "TIBC", "IRON", "RETICCTPCT" in the last 72 hours. Most Recent Urinalysis On File:     Component Value Date/Time   COLORURINE YELLOW (A) 04/09/2023 0337   APPEARANCEUR CLOUDY (A) 04/09/2023 0337   LABSPEC 1.016 04/09/2023 0337   PHURINE 5.0 04/09/2023 0337   GLUCOSEU 50 (A) 04/09/2023 0337   HGBUR NEGATIVE 04/09/2023 0337   BILIRUBINUR NEGATIVE 04/09/2023 0337   BILIRUBINUR neg 08/08/2019 0922   KETONESUR 5 (A) 04/09/2023 0337   PROTEINUR NEGATIVE 04/09/2023 0337   UROBILINOGEN 0.2 08/08/2019 0922   NITRITE NEGATIVE 04/09/2023 0337   LEUKOCYTESUR LARGE (A) 04/09/2023 0337   Sepsis Labs: @LABRCNTIP (procalcitonin:4,lacticidven:4) Microbiology: Recent Results (from the past 240 hours)  Surgical pcr screen     Status: Abnormal   Collection Time: 04/29/23  5:30 PM   Specimen: Nasal Mucosa; Nasal Swab  Result Value Ref Range Status   MRSA, PCR POSITIVE (A) NEGATIVE Final    Comment: RESULT CALLED TO, READ BACK BY AND VERIFIED WITH:  MICHELLE WATSON AT 2126 04/29/23 JG    Staphylococcus aureus POSITIVE (A) NEGATIVE Final    Comment: (NOTE) The Xpert SA Assay (FDA approved for  NASAL specimens in patients 54 years of age and older), is one component of a comprehensive surveillance program. It is not intended to diagnose infection nor to guide or monitor treatment. Performed at [facility], [address], [place], [place] [address]       Radiology Studies last 3 days: DG Toe Great Left Result Date: 04/28/2023 CLINICAL DATA:  Gangrene in great toe EXAM: LEFT GREAT TOE COMPARISON:  None Available. FINDINGS: Extensive cortical erosion in the head of the proximal phalanx left great toe, with height loss more marked laterally than medially. Focal areas of bone loss at the lateral margin base distal phalanx. There is resultant lateral angulation of the distal phalanx. Extensive vascular site calcifications are noted in the distal foot. IMPRESSION: 1. Extensive septic arthritis and osteomyelitis of the great toe as above. Electronically Signed   By: Corlis Leak M.D.   On: 04/28/2023 19:58          Sunnie Nielsen, DO [facility] 04/30/2023, 2:22 PM    Dictation software may have been used to generate the above note. Typos may occur and escape review in typed/dictated notes. Please contact Dr Lyn Hollingshead directly for clarity if needed.  Staff may message me via secure chat in Epic  but this may not receive an immediate response,  please page me for urgent matters!  If 7PM-7AM, please contact night coverage [email or web address]

## 2023-05-01 ENCOUNTER — Encounter: Payer: Self-pay | Admitting: Vascular Surgery

## 2023-05-01 ENCOUNTER — Encounter
Admission: EM | Disposition: A | Discharge status: Skilled Nursing Facility | Payer: Self-pay | Source: Ambulatory Visit | Attending: Osteopathic Medicine

## 2023-05-01 ENCOUNTER — Inpatient Hospital Stay: Admitting: General Practice

## 2023-05-01 ENCOUNTER — Other Ambulatory Visit: Payer: Self-pay

## 2023-05-01 DIAGNOSIS — I96 Gangrene, not elsewhere classified: Secondary | ICD-10-CM | POA: Diagnosis not present

## 2023-05-01 HISTORY — PX: AMPUTATION: SHX166

## 2023-05-01 LAB — GLUCOSE, CAPILLARY
Glucose-Capillary: 101 mg/dL — ABNORMAL HIGH (ref 70–99)
Glucose-Capillary: 115 mg/dL — ABNORMAL HIGH (ref 70–99)
Glucose-Capillary: 152 mg/dL — ABNORMAL HIGH (ref 70–99)
Glucose-Capillary: 181 mg/dL — ABNORMAL HIGH (ref 70–99)
Glucose-Capillary: 233 mg/dL — ABNORMAL HIGH (ref 70–99)
Glucose-Capillary: 237 mg/dL — ABNORMAL HIGH (ref 70–99)
Glucose-Capillary: 90 mg/dL (ref 70–99)

## 2023-05-01 LAB — TYPE AND SCREEN
ABO/RH(D): A POS
Antibody Screen: NEGATIVE

## 2023-05-01 LAB — BASIC METABOLIC PANEL
Anion gap: 8 (ref 5–15)
BUN: 23 mg/dL (ref 8–23)
CO2: 23 mmol/L (ref 22–32)
Calcium: 8.5 mg/dL — ABNORMAL LOW (ref 8.9–10.3)
Chloride: 104 mmol/L (ref 98–111)
Creatinine, Ser: 0.68 mg/dL (ref 0.44–1.00)
GFR, Estimated: >60 mL/min (ref >60)
Glucose, Bld: 235 mg/dL — ABNORMAL HIGH (ref 70–99)
Potassium: 4.7 mmol/L (ref 3.5–5.1)
Sodium: 135 mmol/L (ref 135–145)

## 2023-05-01 LAB — PHOSPHORUS: Phosphorus: 3.2 mg/dL (ref 2.5–4.6)

## 2023-05-01 LAB — MAGNESIUM: Magnesium: 2.2 mg/dL (ref 1.7–2.4)

## 2023-05-01 SURGERY — AMPUTATION, FOOT, RAY
Anesthesia: General | Laterality: Left

## 2023-05-01 MED ORDER — BUPIVACAINE HCL 0.5 % IJ SOLN
INTRAMUSCULAR | Status: DC | PRN
  Administered 2023-05-01: 10 mL
  Administered 2023-05-01: 20 mL

## 2023-05-01 MED ORDER — PHENYLEPHRINE 80 MCG/ML (10ML) SYRINGE FOR IV PUSH (FOR BLOOD PRESSURE SUPPORT)
PREFILLED_SYRINGE | INTRAVENOUS | Status: AC
  Filled 2023-05-01: qty 10

## 2023-05-01 MED ORDER — ASPIRIN 81 MG PO TBEC
81.0000 mg | DELAYED_RELEASE_TABLET | Freq: Every day | ORAL | Status: DC
  Administered 2023-05-02 – 2023-05-04 (×3): 81 mg via ORAL
  Filled 2023-05-01 (×3): qty 1

## 2023-05-01 MED ORDER — PHENYLEPHRINE 80 MCG/ML (10ML) SYRINGE FOR IV PUSH (FOR BLOOD PRESSURE SUPPORT)
PREFILLED_SYRINGE | INTRAVENOUS | Status: DC | PRN
  Administered 2023-05-01 (×3): 80 ug via INTRAVENOUS

## 2023-05-01 MED ORDER — PROPOFOL 500 MG/50ML IV EMUL
INTRAVENOUS | Status: DC | PRN
  Administered 2023-05-01 (×2): 20 mg via INTRAVENOUS
  Administered 2023-05-01: 75 ug/kg/min via INTRAVENOUS

## 2023-05-01 MED ORDER — SODIUM CHLORIDE 0.9 % IV SOLN: INTRAVENOUS | Status: DC | PRN

## 2023-05-01 MED ORDER — LIDOCAINE HCL (PF) 2 % IJ SOLN
INTRAMUSCULAR | Status: AC
  Filled 2023-05-01: qty 5

## 2023-05-01 MED ORDER — PROPOFOL 10 MG/ML IV BOLUS
INTRAVENOUS | Status: AC
  Filled 2023-05-01: qty 20

## 2023-05-01 MED ORDER — CLOPIDOGREL BISULFATE 75 MG PO TABS
75.0000 mg | ORAL_TABLET | Freq: Every day | ORAL | Status: DC
  Administered 2023-05-02 – 2023-05-04 (×3): 75 mg via ORAL
  Filled 2023-05-01 (×3): qty 1

## 2023-05-01 MED ORDER — LIDOCAINE HCL (PF) 1 % IJ SOLN
INTRAMUSCULAR | Status: AC
  Filled 2023-05-01: qty 30

## 2023-05-01 MED ORDER — LIDOCAINE HCL (CARDIAC) PF 100 MG/5ML IV SOSY
PREFILLED_SYRINGE | INTRAVENOUS | Status: DC | PRN
  Administered 2023-05-01: 40 mg via INTRAVENOUS

## 2023-05-01 MED ORDER — ENSURE ENLIVE PO LIQD
237.0000 mL | Freq: Two times a day (BID) | ORAL | Status: DC
  Administered 2023-05-02 – 2023-05-03 (×2): 237 mL via ORAL

## 2023-05-01 MED ORDER — 0.9 % SODIUM CHLORIDE (POUR BTL) OPTIME
TOPICAL | Status: DC | PRN
  Administered 2023-05-01: 300 mL

## 2023-05-01 MED ORDER — BUPIVACAINE HCL (PF) 0.5 % IJ SOLN
INTRAMUSCULAR | Status: AC
  Filled 2023-05-01: qty 30

## 2023-05-01 SURGICAL SUPPLY — 54 items
BAG COUNTER SPONGE SURGICOUNT (BAG) IMPLANT
BLADE OSC/SAGITTAL MD 9X18.5 (BLADE) ×1 IMPLANT
BLADE SURG 15 STRL LF DISP TIS (BLADE) ×2 IMPLANT
BLADE SW THK.38XMED LNG THN (BLADE) ×1 IMPLANT
BNDG COHESIVE 4X5 TAN STRL LF (GAUZE/BANDAGES/DRESSINGS) ×1 IMPLANT
BNDG ELASTIC 4INX 5YD STR LF (GAUZE/BANDAGES/DRESSINGS) ×1 IMPLANT
BNDG ELASTIC 6INX 5YD STR LF (GAUZE/BANDAGES/DRESSINGS) ×1 IMPLANT
BNDG ESMARCH 4X12 STRL LF (GAUZE/BANDAGES/DRESSINGS) ×1 IMPLANT
BNDG GAUZE DERMACEA FLUFF 4 (GAUZE/BANDAGES/DRESSINGS) ×2 IMPLANT
BNDG STRETCH 4X75 STRL LF (GAUZE/BANDAGES/DRESSINGS) ×1 IMPLANT
CUFF TOURN SGL QUICK 12 (TOURNIQUET CUFF) ×1 IMPLANT
CUFF TOURN SGL QUICK 18X4 (TOURNIQUET CUFF) ×1 IMPLANT
DRAIN PENROSE 12X.25 LTX STRL (MISCELLANEOUS) IMPLANT
DRAPE FLUOR MINI C-ARM 54X84 (DRAPES) ×1 IMPLANT
DRSG MEPILEX FLEX 3X3 (GAUZE/BANDAGES/DRESSINGS) IMPLANT
DRSG XEROFORM 1X8 (GAUZE/BANDAGES/DRESSINGS) IMPLANT
DURAPREP 26ML APPLICATOR (WOUND CARE) ×1 IMPLANT
ELECT REM PT RETURN 9FT ADLT (ELECTROSURGICAL) ×1 IMPLANT
ELECTRODE REM PT RTRN 9FT ADLT (ELECTROSURGICAL) ×1 IMPLANT
GAUZE SPONGE 4X4 12PLY STRL (GAUZE/BANDAGES/DRESSINGS) ×1 IMPLANT
GAUZE XEROFORM 1X8 LF (GAUZE/BANDAGES/DRESSINGS) ×2 IMPLANT
GAUZE XEROFORM 4X4 STRL (GAUZE/BANDAGES/DRESSINGS) IMPLANT
GLOVE BIO SURGEON STRL SZ7 (GLOVE) ×1 IMPLANT
GLOVE INDICATOR 7.0 STRL GRN (GLOVE) ×1 IMPLANT
GLOVE INDICATOR 7.5 STRL GRN (GLOVE) ×1 IMPLANT
GOWN STRL REUS W/ TWL LRG LVL3 (GOWN DISPOSABLE) ×2 IMPLANT
HANDLE YANKAUER SUCT BULB TIP (MISCELLANEOUS) IMPLANT
HANDPIECE VERSAJET DEBRIDEMENT (MISCELLANEOUS) IMPLANT
KIT TURNOVER KIT A (KITS) ×1 IMPLANT
MANIFOLD NEPTUNE II (INSTRUMENTS) ×1 IMPLANT
NDL HYPO 25X1 1.5 SAFETY (NEEDLE) ×2 IMPLANT
NDL SAFETY ECLIPSE 18X1.5 (NEEDLE) ×1 IMPLANT
NEEDLE HYPO 25X1 1.5 SAFETY (NEEDLE) ×2 IMPLANT
NS IRRIG 500ML POUR BTL (IV SOLUTION) ×1 IMPLANT
PACK EXTREMITY ARMC (MISCELLANEOUS) ×1 IMPLANT
PAD ABD DERMACEA PRESS 5X9 (GAUZE/BANDAGES/DRESSINGS) IMPLANT
PULSAVAC PLUS IRRIG FAN TIP (DISPOSABLE) IMPLANT
SOL PREP PVP 2OZ (MISCELLANEOUS) ×1 IMPLANT
SOLUTION PREP PVP 2OZ (MISCELLANEOUS) ×1 IMPLANT
SPONGE T-LAP 18X18 ~~LOC~~+RFID (SPONGE) ×1 IMPLANT
STAPLER SKIN PROX 35W (STAPLE) ×1 IMPLANT
STOCKINETTE M/LG 89821 (MISCELLANEOUS) ×1 IMPLANT
STRIP CLOSURE SKIN 1/4X4 (GAUZE/BANDAGES/DRESSINGS) ×1 IMPLANT
SUT ETHILON 2 0 FS 18 (SUTURE) ×2 IMPLANT
SUT ETHILON 2 0 FSLX (SUTURE) ×1 IMPLANT
SUT ETHILON 3-0 FS-10 30 BLK (SUTURE) ×2 IMPLANT
SUT VIC AB 2-0 CT1 TAPERPNT 27 (SUTURE) ×1 IMPLANT
SUT VIC AB 2-0 SH 27XBRD (SUTURE) ×1 IMPLANT
SUT VIC AB 3-0 SH 27X BRD (SUTURE) ×2 IMPLANT
SUTURE EHLN 3-0 FS-10 30 BLK (SUTURE) ×2 IMPLANT
SYR 10ML LL (SYRINGE) ×2 IMPLANT
TIP FAN IRRIG PULSAVAC PLUS (DISPOSABLE) IMPLANT
TRAP FLUID SMOKE EVACUATOR (MISCELLANEOUS) ×1 IMPLANT
WATER STERILE IRR 500ML POUR (IV SOLUTION) ×1 IMPLANT

## 2023-05-01 NOTE — Progress Notes (Signed)
 Pharmacy Antibiotic Note  [name] is a 89 y.o. female w/ PMH of  dementia, L hemiparesis with inability to meaningfully use left upper and lower extermties, DM, diabetic polyneuropathy, HTN, HLD, PVD admitted on 04/28/2023 with gangrene of the left big toe and with the intent for amputation.  Pharmacy has been consulted for vancomycin and Zosyn dosing.  Plan:  1) Continue Zosyn 3.375g IV q8h (4 hour infusion).  2) Change to vancomycin 1000 mg IV every 24 hours Goal AUC 400-550. Expected AUC: 516, Cmin 13 SCr used: 0.80 mg/dL (rounded up)   Height: 5\' 4"  (162.6 cm) Weight: 67.4 kg (148 lb 9.4 oz) IBW/kg (Calculated) : 54.7  Temp (24hrs), Avg:97.7 F (36.5 C), Min:97 F (36.1 C), Max:98.2 F (36.8 C)  Recent Labs  Lab 04/28/23 1532 04/28/23 1534 04/28/23 2107 04/29/23 0557 04/30/23 0521 04/30/23 2313 05/01/23 0507  WBC 8.6  --   --  6.8  --  7.9  --   CREATININE 0.66  --   --  0.53 0.67  --  0.68  LATICACIDVEN  --  2.1* 1.9 0.7  --   --   --     Estimated Creatinine Clearance: 46.8 mL/min (by C-G formula based on SCr of 0.68 mg/dL).    Allergies  Allergen Reactions   Metformin And Related Nausea And Vomiting    Antimicrobials this admission: 03/18 vancomycin >>  03/18 Zosyn >>   Microbiology results: None  Thank you for allowing pharmacy to be a part of this patient's care.  Barrie Folk, PharmD 05/01/2023 8:45 AM

## 2023-05-01 NOTE — Anesthesia Postprocedure Evaluation (Signed)
 Anesthesia Post Note  Patient: [name]  Procedure(s) Performed: AMPUTATION, FOOT, RAY (Left)  Patient location during evaluation: PACU Anesthesia Type: General Level of consciousness: awake and alert Pain management: pain level controlled Vital Signs Assessment: post-procedure vital signs reviewed and stable Respiratory status: spontaneous breathing, nonlabored ventilation, respiratory function stable and patient connected to nasal cannula oxygen Cardiovascular status: blood pressure returned to baseline and stable Postop Assessment: no apparent nausea or vomiting Anesthetic complications: no   No notable events documented.   Last Vitals:  Vitals:   05/01/23 1813 05/01/23 1943  BP: (!) 123/58 (!) 136/119  Pulse: 61 67  Resp: 16   Temp: 36.9 C 36.9 C  SpO2: 99% (!) 79%    Last Pain:  Vitals:   05/01/23 1943  TempSrc: Oral  PainSc:                  Cleda Mccreedy Hilda Rynders

## 2023-05-01 NOTE — Op Note (Signed)
 PODIATRY / FOOT AND ANKLE SURGERY OPERATIVE REPORT    SURGEON: Rosetta Posner, DPM  PRE-OPERATIVE DIAGNOSIS:  1.  Left hallux gangrene with osteomyelitis 2.  PVD 3.  Diabetes type 2 polyneuropathy  POST-OPERATIVE DIAGNOSIS: Same  PROCEDURE(S): Left partial first ray amputation Left posterior lateral heel subcutaneous wound debridement  HEMOSTASIS: No tourniquet  ANESTHESIA: MAC  ESTIMATED BLOOD LOSS: 40 cc  FINDING(S): 1.  Left hallux gangrene with osteomyelitis 2.  Pressure ulcer left heel, subcutaneous tissue exposed  PATHOLOGY/SPECIMEN(S): Left hallux bone culture.  Left partial first ray bone path specimen with proximal margin purple ink  INDICATIONS:   [name] is a 89 y.o. female who presents with a chronic nonhealing wound present to the distal aspect of the left hallux with gangrene, patient has been getting worsening gangrene to the area and is now wet.  Patient was admitted to the hospital due to this.  Patient had CT angiogram with intervention yesterday and presents today for surgical intervention.  All treatment options were discussed with the patient and patient's caretakers both conservative and surgical attempts at correction including potential risks and complications, no guarantees given.  Family and patient as well as guardian consent for procedure.  No guarantees given.  DESCRIPTION: After obtaining full informed written consent, the patient was brought back to the operating room and placed supine upon the operating table.  The patient received IV antibiotics prior to induction.  After obtaining adequate anesthesia, the patient was prepped and draped in the standard fashion.  20 cc of half percent Marcaine plain was injected about the left first ray in a Mayo type block.  Attention was directed to the left hallux and first ray where a racquet type incision was made around the hallux excising the gangrenous areas around the base the proximal phalanx and the  racquet was then conjoined at the dorsal medial aspect first metatarsal phalange joint to the distal first metatarsal shaft.  The incision was made in this area straight to bone.  At this time an extensor tenotomy and capsulotomy was performed followed by release the collateral and suspensory ligaments and any connection to the flexor tendon and plantar plate were released.  The hallux was disarticulated and passed off in the operative site.  A bone culture was taken from the hallux and the area of the wound and osteomyelitis and sent off for bone culture.  Circumferential dissection was then continued around the first metatarsal to the level of the distal shaft.  At this time a sagittal bone saw was then used to resect the head of the first metatarsal with the appropriate beveling at the distal shaft.  The head was passed off the operative site with proximal margin marked in purple ink.  The sesamoids were also resected and passed off the operative site and sent off to pathology.  Any bleeding vessels were cauterized.  There appeared to be a moderate amount of bleeding.  Bleeding hemostasis was achieved with electrocauterization.  The area was flushed with copious amounts normal sterile saline.  The deep structures as well as subcutaneous tissue was reapproximated well coapted with 3-0 Vicryl and the skin was then reapproximated well coapted with 3-0 nylon.  Attention was then directed to the posterior lateral aspect of the left heel.  There appeared to be for the most part a dermal type wound present but centrally there appeared to be a fibronecrotic plug present centrally that appeared to go within the subcutaneous tissue, no obvious signs of infection  present to the area.  The wound itself in its entirety measure approximately 2 cm x 2 cm by unknown depth but the central area of the fibrotic plug approximately 0.5 x 0.4 cm.  100% excisional subcutaneous wound debridement was performed to the area with 15  blade, healthy bleeding was obtained from the area of the fibrotic plug to a granular type base.  The wound appeared to be fairly superficial overall.  Postdebridement measurement was the same as the predebridement with a depth of 0.2 cm.  Hemostasis was achieved with compression.  An additional 10 cc of half percent Marcaine plain was injected about the operative sites.  A postoperative dressing was then applied consisting of Xeroform to the incisional areas followed by 4 x 4 gauze, ABD, gauze roll, Ace wrap.  The patient tolerated the procedure and anesthesia well and was transferred to recovery in vital signs stable vascular status intact to all toes left foot.  Following.  Postoperative monitoring the patient will be discharged back to the inpatient room with the appropriate orders and instructions.  From podiatry stay point patient can be discharged when medically stable.  Podiatry team to sign off at this time.  Patient should follow-up in outpatient clinic in 1 week.  Recommend keeping dressings clean, dry, and intact during that time and use Prevalon boots all the time.  Only change dressing if strikethrough occurs, instructions placed in chart.  Recommend discharging on 7-day course of Keflex.  Will follow-up with outpatient cultures.  COMPLICATIONS: None  CONDITION: Good, stable  Rosetta Posner, DPM

## 2023-05-01 NOTE — H&P (Signed)
 HISTORY AND PHYSICAL INTERVAL NOTE:  05/01/2023  4:10 PM  [name]  has presented today for surgery, with the diagnosis of GANGRENE LEFT FOOT.  The various methods of treatment have been discussed with the patient.  No guarantees were given.  After consideration of risks, benefits and other options for treatment, the patient has consented to surgery.  I have reviewed the patients' chart and labs.    PROCEDURE: LEFT PARTIAL 1ST RAY AMPUTATION  A history and physical examination was performed in the hospital.  The patient was reexamined.  There have been no changes to this history and physical examination.  Rosetta Posner, DPM

## 2023-05-01 NOTE — Transfer of Care (Signed)
 Immediate Anesthesia Transfer of Care Note  Patient: [name]  Procedure(s) Performed: AMPUTATION, FOOT, RAY (Left)  Patient Location: PACU  Anesthesia Type:MAC  Level of Consciousness: awake  Airway & Oxygen Therapy: Patient Spontanous Breathing and Patient connected to face mask oxygen  Post-op Assessment: Report given to RN and Post -op Vital signs reviewed and stable  Post vital signs: Reviewed and stable  Last Vitals:  Vitals Value Taken Time  BP 109/59 05/01/23 1719  Temp    Pulse 68 05/01/23 1725  Resp 20 05/01/23 1725  SpO2 97 % 05/01/23 1725  Vitals shown include unfiled device data.  Last Pain:  Vitals:   05/01/23 1553  TempSrc: Tympanic  PainSc: 0-No pain         Complications: No notable events documented.

## 2023-05-01 NOTE — Progress Notes (Signed)
 Progress Note    05/01/2023 3:05 PM 1 Day Post-Op  Subjective: [name] is an 89 year old female who is now postop day 1 from aortogram with selective left lower extremity angiogram with angioplasty of left anterior tibial artery and proximal segment of the tibioperoneal trunk, angioplasty of the left SFA and popliteal artery.  Patient was resting comfortably in bed this afternoon.  Nursing alerted myself of continued bleeding from her right groin insertion site.  I checked this I did not see any.  She has a pressure dressing I instructed nursing to continue with the pressure dressing.  Patient has a history of dementia and was rambling as I examined her.  Left lower extremity feels warm to touch.  Hard to palpate a pulse due to her continuous moving while examining her.  No complaints overnight and her vitals all remained stable.   Vitals:   05/01/23 0323 05/01/23 0752  BP: (!) 119/57 128/77  Pulse: 62 72  Resp: 20 17  Temp: (!) 97 F (36.1 C) 98 F (36.7 C)  SpO2: 100% 100%   Physical Exam: Cardiac:  RRR, normal S1 and S2.  No murmurs appreciated. Lungs: Clear on auscultation throughout, nonlabored breathing.  No rales rhonchi or wheezing appreciated. Incisions: Right groin with dressing clean dry and intact.  No hematoma seroma oozing or bleeding noted. Extremities: Right lower extremity warm to touch with positive Doppler DP/PT.  Left lower extremity with positive Doppler dorsalis pedis and possible posttibial artery pulses.  Left lower extremity was cooler than the right lower extremity to touch. Abdomen: Positive bowel sounds throughout, soft, nontender nondistended. Neurologic: History of dementia with rambling speech.  Unable to follow my commands.  History of CVA.  CBC    Component Value Date/Time   WBC 7.9 04/30/2023 2313   RBC 2.72 (L) 04/30/2023 2313   HGB 8.5 (L) 04/30/2023 2313   HGB 8.6 (L) 09/25/2013 0410   HCT 27.1 (L) 04/30/2023 2313   HCT 27.0 (L)  09/24/2013 0409   PLT 280 04/30/2023 2313   PLT 192 09/24/2013 0409   MCV 99.6 04/30/2023 2313   MCV 96 09/24/2013 0409   MCH 31.3 04/30/2023 2313   MCHC 31.4 04/30/2023 2313   RDW 13.9 04/30/2023 2313   RDW 13.8 09/24/2013 0409   LYMPHSABS 2.3 04/28/2023 1532   LYMPHSABS 2.5 09/24/2013 0409   MONOABS 0.5 04/28/2023 1532   MONOABS 0.7 09/24/2013 0409   EOSABS 0.5 04/28/2023 1532   EOSABS 0.4 09/24/2013 0409   BASOSABS 0.0 04/28/2023 1532   BASOSABS 0.0 09/24/2013 0409    BMET    Component Value Date/Time   NA 135 05/01/2023 0507   NA 143 09/24/2013 0409   K 4.7 05/01/2023 0507   K 3.6 09/24/2013 0409   CL 104 05/01/2023 0507   CL 108 (H) 09/24/2013 0409   CO2 23 05/01/2023 0507   CO2 27 09/24/2013 0409   GLUCOSE 235 (H) 05/01/2023 0507   GLUCOSE 222 (H) 09/24/2013 0409   BUN 23 05/01/2023 0507   BUN 7 09/24/2013 0409   CREATININE 0.68 05/01/2023 0507   CREATININE 0.77 09/24/2013 0409   CALCIUM 8.5 (L) 05/01/2023 0507   CALCIUM 7.7 (L) 09/24/2013 0409   GFRNONAA >60 05/01/2023 0507   GFRNONAA >60 09/24/2013 0409   GFRAA >60 09/26/2019 1952   GFRAA >60 09/24/2013 0409    INR    Component Value Date/Time   INR 1.3 (H) 04/29/2023 0557    No intake or output data in  the 24 hours ending 05/01/23 1505   Assessment/Plan:  89 y.o. female is s/p left lower aortogram with angioplasty to the left anterior tibial artery, proximal segment of the tibioperoneal trunk, left SFA, and popliteal artery.  1 Day Post-Op   PLAN Plan is for the patient to go back to the operating room later today for a left first ray amputation.  Vascular surgery at this time does not plan on doing any other procedures or interventions.  No complications to note at this time.  Patient is recovering as expected. Vascular surgery signed off this case at this time.  DVT prophylaxis: Lovenox 40 mg subcu every 24 hours   Marcie [facility] 05/01/2023 3:05 PM

## 2023-05-01 NOTE — Plan of Care (Signed)
  Problem: Education: Goal: Ability to describe self-care measures that may prevent or decrease complications (Diabetes Survival Skills Education) will improve Outcome: Progressing   Problem: Tissue Perfusion: Goal: Adequacy of tissue perfusion will improve Outcome: Progressing   Problem: Education: Goal: Knowledge of General Education information will improve Description: Including pain rating scale, medication(s)/side effects and non-pharmacologic comfort measures Outcome: Progressing

## 2023-05-01 NOTE — Anesthesia Preprocedure Evaluation (Addendum)
 Anesthesia Evaluation  Patient identified by MRN, date of birth, ID band Patient confused    Reviewed: Allergy & Precautions, NPO status , Patient's Chart, lab work & pertinent test results  History of Anesthesia Complications Negative for: history of anesthetic complications  Airway Mallampati: III  TM Distance: >3 FB Neck ROM: full    Dental  (+) Poor Dentition   Pulmonary neg pulmonary ROS   Pulmonary exam normal        Cardiovascular hypertension, On Medications + Peripheral Vascular Disease  Normal cardiovascular exam+ Valvular Problems/Murmurs MR and AI      Neuro/Psych  PSYCHIATRIC DISORDERS     Dementia Weakness in her RUE  Neuromuscular disease CVA (L hemiparesis), Residual Symptoms    GI/Hepatic negative GI ROS, Neg liver ROS,,,  Endo/Other  diabetes, Type 2    Renal/GU      Musculoskeletal   Abdominal   Peds  Hematology negative hematology ROS (+)   Anesthesia Other Findings Past Medical History: No date: Carpal tunnel syndrome on both sides No date: Diabetes (HCC) No date: Diabetic polyneuropathy (HCC) No date: Diverticulitis No date: Hyperlipidemia No date: Hypertension No date: Osteoarthritis No date: Vaginal prolapse  Past Surgical History: No date: CESAREAN SECTION 01/09/2022: IRRIGATION AND DEBRIDEMENT FOOT; Left     Comment:  Procedure: IRRIGATION AND DEBRIDEMENT FOOT WITH BONE               BIOPSY;  Surgeon: Gwyneth Revels, DPM;  Location: [facility]               ORS;  Service: Podiatry;  Laterality: Left; 06/11/2021: LOWER EXTREMITY ANGIOGRAPHY; Left     Comment:  Procedure: Lower Extremity Angiography;  Surgeon:               Renford Dills, MD;  Location: [facility] INVASIVE CV LAB;               Service: Cardiovascular;  Laterality: Left; 08/11/2022: LOWER EXTREMITY ANGIOGRAPHY; Left     Comment:  Procedure: Lower Extremity Angiography;  Surgeon:               Renford Dills, MD;   Location: [facility] INVASIVE CV LAB;               Service: Cardiovascular;  Laterality: Left; No date: pessary No date: REPLACEMENT TOTAL KNEE BILATERAL No date: VENTRAL HERNIA REPAIR 01/13/2016: VENTRAL HERNIA REPAIR; N/A     Comment:  Procedure: HERNIA REPAIR VENTRAL ADULT WITH SMALL BOWEL               RESECTION, LYSIS OF ADHESIONS;  Surgeon: Gladis Riffle, MD;  Location: [facility] ORS;  Service: General;                Laterality: N/A;  BMI    Body Mass Index: 24.20 kg/m      Reproductive/Obstetrics negative OB ROS                             Anesthesia Physical Anesthesia Plan  ASA: 3  Anesthesia Plan: General   Post-op Pain Management:    Induction: Intravenous  PONV Risk Score and Plan: 3 and Ondansetron, Propofol infusion and TIVA  Airway Management Planned: Natural Airway and Nasal Cannula  Additional Equipment:   Intra-op Plan:   Post-operative Plan:   Informed Consent: I have reviewed the patients  History and Physical, chart, labs and discussed the procedure including the risks, benefits and alternatives for the proposed anesthesia with the patient or authorized representative who has indicated his/her understanding and acceptance.     Dental Advisory Given and Consent reviewed with POA  Plan Discussed with: Anesthesiologist, CRNA and Surgeon  Anesthesia Plan Comments: (Patient's guardian, Lenn Sink ([phone]) consented for risks( by Dr. Lorette Ang) of anesthesia including but not limited to:  - adverse reactions to medications - risk of airway placement if required - damage to eyes, teeth, lips or other oral mucosa - nerve damage due to positioning  - sore throat or hoarseness - Damage to heart, brain, nerves, lungs, other parts of body or loss of life  Patient's guardian, Lenn Sink, voiced understanding and assent.)       Anesthesia Quick Evaluation

## 2023-05-01 NOTE — Progress Notes (Signed)
 PROGRESS NOTE    [name]   NWG:[id number] DOB: Feb 21, 1933  DOA: 04/28/2023 Date of Service: 05/01/23 which is hospital day 3  PCP: [name] Nurse, MD    Hospital course / significant events:   HPI: [name] is a 89 y.o. female with medical history significant of dementia and left hemiparesis with inability to meaningfully use left upper and lower extermties.type 2 diabetes, diabetic polyneuropathy, essential hypertension, dyslipidemia, peripheral arterial disease.  She is ordinarily a resident of nursing home facility.  Patient is also under the guardianship of Department of Social Services represented by [name].  History limited due to patient's dementia. Patient apparently had been seen by podiatry as well as vascular surgery over the last wek prior to coming to the ED, and had been advised to come to the hospital due to gangrene of the left big toe and with the intent for amputation of the toe.  Patient finally presented to the ER 03/18.    03/18: admitted to hospitalist for gangrene, consulted podiatry and vascular surgery. Continued on Vanc, Zosyn. No sepsis.  03/19: plan for angio tomorrow  03/20:  angiography and balloon angioplasty 03/21: L foot ray amputation     Consultants:  Podiatry  Vascular Surgery   Procedures/Surgeries: 04/30/23 angiography and balloon angioplasty w/ Dr Wyn Quaker [05/01/23 L foot ray amputation planned w/ Dr Excell Seltzer      ASSESSMENT & PLAN:   Toe gangrene L hallux No sepsis  This appears chronic/dry - vascular concerned about it turning wet. No sepsis  Vascular and Podiatry teams following angio yesterday,  L foot ray amputation planned w/ Dr Excell Seltzer today   Abx: vancomycin, Zosyn Pain control     Chronic pain:  oxycodone standing to 5 mg every 4 hours as needed. Bowel regimen ordered with MiraLAX   Prior documented peripheral arterial disease:  Aspirin, statin, Plavix   Diabetes mellitus with neuropathy Insulin  sliding scale   HTN  diastolic blood pressure is ranging from 49-59 at this time.   Reduce dose on Zestril at this time   Nutrition/Supplements Continue with feeding supplementation with Pro-Stat Continue with home vitamin B-12 ferrous sulfate and magnesium oxide    No concerns based on BMI: Body mass index is 21.8 kg/m.  Underweight - under 18  overweight - 25 to 29 obese - 30 or more Class 1 obesity: BMI of 30.0 to 34 Class 2 obesity: BMI of 35.0 to 39 Class 3 obesity: BMI of 40.0 to 49 Super Morbid Obesity: BMI 50-59 Super-super Morbid Obesity: BMI 60+ Significantly low or high BMI is associated with higher medical risk.  Weight management advised as adjunct to other disease management and risk reduction treatments    DVT prophylaxis: lovenox IV fluids: no continuous IV fluids  Nutrition: cardiac/carb diet  Central lines / other devices: none  Code Status: spoke w/ guardian [name] 8:09 AM 04/29/23 and confirmed FULL CODE unless there is decompensation / new clinical development such that CPR/resuscitation would be futile, will leave full code and will update guardian if there is strong recommendation to change this.  ACP documentation reviewed: none on file in [facility]  TOC needs: TBD but expect will need SNF/return to SNF w/ rehab  Medical barriers to dispo: vascular and podiatry following, planned for amputation. Expected medical readiness for discharge back to facility probably over weekend or Monday              Subjective / Brief ROS:  Patient reports pain in  L foot No other concerns or questions at this time    Family Communication:none at this time     Objective Findings:  Vitals:   04/30/23 2259 05/01/23 0323 05/01/23 0413 05/01/23 0752  BP: (!) 107/47 (!) 119/57  128/77  Pulse: 61 62  72  Resp: 18 20  17   Temp:  (!) 97 F (36.1 C)  98 F (36.7 C)  TempSrc:    Oral  SpO2: 100% 100%  100%  Weight:   67.4 kg   Height:        Intake/Output  Summary (Last 24 hours) at 05/01/2023 1349 Last data filed at 04/30/2023 1400 Gross per 24 hour  Intake --  Output 350 ml  Net -350 ml   Filed Weights   04/30/23 0148 04/30/23 1428 05/01/23 0413  Weight: 65.4 kg 65.4 kg 67.4 kg    Examination:  Physical Exam Constitutional:      General: She is not in acute distress.    Appearance: She is not ill-appearing.  Cardiovascular:     Rate and Rhythm: Normal rate and regular rhythm.  Pulmonary:     Effort: Pulmonary effort is normal.     Breath sounds: Normal breath sounds.  Abdominal:     Palpations: Abdomen is soft.  Neurological:     Mental Status: She is alert. Mental status is at baseline.  Psychiatric:        Mood and Affect: Mood normal.        Behavior: Behavior normal.          Scheduled Medications:   ascorbic acid  500 mg Oral BID   [START ON 05/02/2023] aspirin EC  81 mg Oral Daily   atorvastatin  80 mg Oral QHS   Chlorhexidine Gluconate Cloth  6 each Topical Daily   [START ON 05/02/2023] clopidogrel  75 mg Oral Daily   cyanocobalamin  1,000 mcg Oral Daily   enoxaparin (LOVENOX) injection  40 mg Subcutaneous Q24H   feeding supplement  237 mL Oral BID BM   feeding supplement (GLUCERNA SHAKE)  237 mL Oral TID BM   feeding supplement (PRO-STAT SUGAR FREE 64)  30 mL Oral BID   ferrous sulfate  325 mg Oral QODAY   insulin aspart  0-15 Units Subcutaneous TID WC   insulin aspart  0-5 Units Subcutaneous QHS   lisinopril  10 mg Oral Daily   magnesium oxide  200 mg Oral Daily   multivitamin with minerals  1 tablet Oral Daily   mupirocin ointment  1 Application Nasal BID   polyethylene glycol  17 g Oral Daily   sodium chloride flush  3 mL Intravenous Q12H    Continuous Infusions:  piperacillin-tazobactam (ZOSYN)  IV 3.375 g (05/01/23 1306)   vancomycin      PRN Medications:  acetaminophen **OR** acetaminophen, mouth rinse, oxyCODONE, polyethylene glycol  Antimicrobials from admission:  Anti-infectives (From  admission, onward)    Start     Dose/Rate Route Frequency Ordered Stop   05/01/23 1930  vancomycin (VANCOREADY) IVPB 750 mg/150 mL        750 mg 150 mL/hr over 60 Minutes Intravenous Every 24 hours 04/30/23 2019     04/30/23 1351  ceFAZolin (ANCEF) IVPB 2g/100 mL premix  Status:  Discontinued        2 g 200 mL/hr over 30 Minutes Intravenous 30 min pre-op 04/30/23 1351 04/30/23 1527   04/29/23 1600  vancomycin (VANCOREADY) IVPB 750 mg/150 mL  Status:  Discontinued  750 mg 150 mL/hr over 60 Minutes Intravenous Every 24 hours 04/28/23 2017 04/30/23 2019   04/29/23 0600  piperacillin-tazobactam (ZOSYN) IVPB 3.375 g        3.375 g 12.5 mL/hr over 240 Minutes Intravenous Every 8 hours 04/28/23 2017     04/28/23 2130  vancomycin (VANCOCIN) IVPB 1000 mg/200 mL premix        1,000 mg 200 mL/hr over 60 Minutes Intravenous  Once 04/28/23 2114 04/28/23 2346   04/28/23 1830  vancomycin (VANCOCIN) IVPB 1000 mg/200 mL premix  Status:  Discontinued        1,000 mg 200 mL/hr over 60 Minutes Intravenous  Once 04/28/23 1818 04/28/23 2114   04/28/23 1830  piperacillin-tazobactam (ZOSYN) IVPB 3.375 g        3.375 g 100 mL/hr over 30 Minutes Intravenous  Once 04/28/23 1818 04/28/23 1957           Data Reviewed:  I have personally reviewed the following...  CBC: Recent Labs  Lab 04/28/23 1532 04/29/23 0557 04/30/23 2313  WBC 8.6 6.8 7.9  NEUTROABS 5.3  --   --   HGB 10.2* 8.4* 8.5*  HCT 32.8* 26.4* 27.1*  MCV 98.2 96.0 99.6  PLT 457* 319 280   Basic Metabolic Panel: Recent Labs  Lab 04/28/23 1532 04/29/23 0557 04/30/23 0521 05/01/23 0507  NA 139 135 138 135  K 4.7 4.0 4.3 4.7  CL 102 107 106 104  CO2 26 25 25 23   GLUCOSE 175* 95 100* 235*  BUN 31* 29* 30* 23  CREATININE 0.66 0.53 0.67 0.68  CALCIUM 9.5 8.3* 8.6* 8.5*  MG  --   --  2.0 2.2  PHOS  --   --  2.8 3.2   GFR: Estimated Creatinine Clearance: 46.8 mL/min (by C-G formula based on SCr of 0.68 mg/dL). Liver  Function Tests: Recent Labs  Lab 04/28/23 1532  AST 26  ALT 15  ALKPHOS 65  BILITOT 0.5  PROT 7.6  ALBUMIN 3.6   No results for input(s): "LIPASE", "AMYLASE" in the last 168 hours. No results for input(s): "AMMONIA" in the last 168 hours. Coagulation Profile: Recent Labs  Lab 04/29/23 0557  INR 1.3*   Cardiac Enzymes: No results for input(s): "CKTOTAL", "CKMB", "CKMBINDEX", "TROPONINI" in the last 168 hours. BNP (last 3 results) No results for input(s): "PROBNP" in the last 8760 hours. HbA1C: No results for input(s): "HGBA1C" in the last 72 hours. CBG: Recent Labs  Lab 04/30/23 2254 05/01/23 0004 05/01/23 0321 05/01/23 0944 05/01/23 1258  GLUCAP 167* 233* 237* 152* 181*   Lipid Profile: No results for input(s): "CHOL", "HDL", "LDLCALC", "TRIG", "CHOLHDL", "LDLDIRECT" in the last 72 hours. Thyroid Function Tests: No results for input(s): "TSH", "T4TOTAL", "FREET4", "T3FREE", "THYROIDAB" in the last 72 hours. Anemia Panel: No results for input(s): "VITAMINB12", "FOLATE", "FERRITIN", "TIBC", "IRON", "RETICCTPCT" in the last 72 hours. Most Recent Urinalysis On File:     Component Value Date/Time   COLORURINE YELLOW (A) 04/09/2023 0337   APPEARANCEUR CLOUDY (A) 04/09/2023 0337   LABSPEC 1.016 04/09/2023 0337   PHURINE 5.0 04/09/2023 0337   GLUCOSEU 50 (A) 04/09/2023 0337   HGBUR NEGATIVE 04/09/2023 0337   BILIRUBINUR NEGATIVE 04/09/2023 0337   BILIRUBINUR neg 08/08/2019 0922   KETONESUR 5 (A) 04/09/2023 0337   PROTEINUR NEGATIVE 04/09/2023 0337   UROBILINOGEN 0.2 08/08/2019 0922   NITRITE NEGATIVE 04/09/2023 0337   LEUKOCYTESUR LARGE (A) 04/09/2023 0337   Sepsis Labs: @LABRCNTIP (procalcitonin:4,lacticidven:4) Microbiology: Recent Results (from the past  240 hours)  Surgical pcr screen     Status: Abnormal   Collection Time: 04/29/23  5:30 PM   Specimen: Nasal Mucosa; Nasal Swab  Result Value Ref Range Status   MRSA, PCR POSITIVE (A) NEGATIVE Final     Comment: RESULT CALLED TO, READ BACK BY AND VERIFIED WITH:  MICHELLE WATSON AT 2126 04/29/23 JG    Staphylococcus aureus POSITIVE (A) NEGATIVE Final    Comment: (NOTE) The Xpert SA Assay (FDA approved for NASAL specimens in patients 48 years of age and older), is one component of a comprehensive surveillance program. It is not intended to diagnose infection nor to guide or monitor treatment. Performed at [facility], [address], [place], [place] [address]       Radiology Studies last 3 days: PERIPHERAL VASCULAR CATHETERIZATION Result Date: 04/30/2023 See surgical note for result.  DG Toe Great Left Result Date: 04/28/2023 CLINICAL DATA:  Gangrene in great toe EXAM: LEFT GREAT TOE COMPARISON:  None Available. FINDINGS: Extensive cortical erosion in the head of the proximal phalanx left great toe, with height loss more marked laterally than medially. Focal areas of bone loss at the lateral margin base distal phalanx. There is resultant lateral angulation of the distal phalanx. Extensive vascular site calcifications are noted in the distal foot. IMPRESSION: 1. Extensive septic arthritis and osteomyelitis of the great toe as above. Electronically Signed   By: Corlis Leak M.D.   On: 04/28/2023 19:58          Sunnie Nielsen, DO [facility] 05/01/2023, 1:49 PM    Dictation software may have been used to generate the above note. Typos may occur and escape review in typed/dictated notes. Please contact Dr Lyn Hollingshead directly for clarity if needed.  Staff may message me via secure chat in Epic  but this may not receive an immediate response,  please page me for urgent matters!  If 7PM-7AM, please contact night coverage [email or web address]

## 2023-05-01 NOTE — TOC Initial Note (Signed)
 Transition of Care [facility]) - Initial/Assessment Note    Patient Details  Name: [name] MRN: [id number] Date of Birth: July 21, 1933  [facility]) CM/SW Contact:    Chapman Fitch, RN Phone Number: 05/01/2023, 4:25 PM  Clinical Narrative:                  Patient admitted from LTC at [facility].  Patient with high risk for Readmission score.  VM left for Candice Gobble with [facility] to confirm plan to return   Currently patient off the unit for procedure      Patient Goals and CMS Choice            Expected Discharge Plan and Services                                              Prior Living Arrangements/Services                       Activities of Daily Living   ADL Screening (condition at time of admission) Independently performs ADLs?: No Does the patient have a NEW difficulty with bathing/dressing/toileting/self-feeding that is expected to last >3 days?: No Does the patient have a NEW difficulty with getting in/out of bed, walking, or climbing stairs that is expected to last >3 days?: No Does the patient have a NEW difficulty with communication that is expected to last >3 days?: No Is the patient deaf or have difficulty hearing?: Yes Does the patient have difficulty seeing, even when wearing glasses/contacts?: No Does the patient have difficulty concentrating, remembering, or making decisions?: Yes  Permission Sought/Granted                  Emotional Assessment              Admission diagnosis:  Toe gangrene (HCC) [I96] Gangrene of toe of left foot (HCC) [I96] Patient Active Problem List   Diagnosis Date Noted   Protein-calorie malnutrition, severe 04/30/2023   Toe gangrene (HCC) 04/28/2023   Suspected UTI 04/09/2023   Dementia (HCC) 04/09/2023   DJD (degenerative joint disease) 01/21/2023   Ischemic foot 08/27/2022   Osteomyelitis of left foot (HCC) 08/27/2022   Gangrene of toe of left foot (HCC) 08/26/2022    Acute osteomyelitis of left foot (HCC) 08/08/2022   Diabetic foot infection (HCC) 08/07/2022   Hemiplegia affecting left side in left-dominant patient as late effect of cerebrovascular disease (HCC) 01/10/2022   Vaginal candidiasis 01/10/2022   Leg wound, left 01/08/2022   Hypoglycemia 01/07/2022   Leukocytosis 01/07/2022   MRI contraindicated due to metal implant 01/07/2022   Open wound of left foot 01/07/2022   Atherosclerosis of native arteries of the extremities with ulceration (HCC) 06/06/2021   Pressure injury of skin 06/01/2021   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   PAD (peripheral artery disease) (HCC) 05/17/2021   Right hemiparesis (HCC) 05/16/2021   Cellulitis of left foot 05/15/2021   Hypokalemia 05/15/2021   CVA (cerebral vascular accident) (HCC) 11/14/2019   History of stroke 10/07/2019   Altered mental status 10/06/2019   Carotid artery disease (HCC) 09/28/2019   Left hemiparesis (HCC) 09/28/2019   Hemiplegia and hemiparesis following cerebral infarction affecting right dominant side (HCC) 09/27/2019   UTI (urinary tract infection) 09/20/2019   Sepsis (HCC) 03/22/2018   Primary osteoarthritis of both hips 11/06/2017  Microalbuminuria 11/02/2017   Cystocele, midline 09/04/2017   Incomplete uterine prolapse 09/04/2017   Type 2 diabetes mellitus with microalbuminuria, without long-term current use of insulin (HCC) 07/27/2017   Ankle edema 04/27/2017   Pure hypercholesterolemia 03/30/2016   Malnutrition of moderate degree 01/15/2016   Dehydration    Incarcerated ventral hernia    Diabetic ketoacidosis without coma associated with type 2 diabetes mellitus (HCC)    Type 2 diabetes mellitus with polyneuropathy (HCC) 08/06/2015   Carpal tunnel syndrome, bilateral 07/10/2015   Osteopenia of multiple sites 07/10/2015   Arthralgia of multiple sites 06/27/2015   Mixed hyperlipidemia 11/06/2014   Hypomagnesemia 11/02/2013   Essential hypertension 11/02/2013   PCP:  Gracelyn Nurse, MD Pharmacy:   [facility]. [place], [place] - [address] [place] [address] Phone: [phone] Fax: [phone]     Social Drivers of Health (SDOH) Social History: SDOH Screenings   Food Insecurity: Patient Unable To Answer (04/28/2023)  Housing: Patient Unable To Answer (04/28/2023)  Transportation Needs: Patient Unable To Answer (04/28/2023)  Utilities: Patient Unable To Answer (04/28/2023)  Social Connections: Unknown (04/28/2023)  Recent Concern: Social Connections - Socially Isolated (04/09/2023)  Tobacco Use: Low Risk  (05/01/2023)   SDOH Interventions:     Readmission Risk Interventions     No data to display

## 2023-05-01 NOTE — Discharge Instructions (Signed)
 Podiatry discharge instructions: 1.  Keep dressings to the left foot clean, dry, and intact until visit in outpatient clinic.  Please wear Prevalon boots at all times to try to prevent further pressure ulcerations.   2.  Please schedule visit within 1 week of surgical date at some point next week with Dr. Excell Seltzer with [facility]. 3.  Take antibiotics as prescribed until gone. 4.  If dressings to the left foot become saturated or loose send you can remove the dressing.  Please apply nonadherent gauze such as Xeroform to the incisional area and left heel wound followed by 4 x 4 gauze, ABD pads to both areas, Kerlix, Ace wrap with mild compression. 5.  Please schedule regular follow-up with [facility] for further assessment of circulation.

## 2023-05-01 NOTE — Care Management Important Message (Signed)
 Important Message  Patient Details  Name: [name] MRN: [id number] Date of Birth: [birth year]   Important Message Given:  Yes - Medicare IM     Cristela Blue, CMA 05/01/2023, 11:29 AM

## 2023-05-01 NOTE — Plan of Care (Signed)

## 2023-05-02 ENCOUNTER — Encounter: Payer: Self-pay | Admitting: Podiatry

## 2023-05-02 DIAGNOSIS — I96 Gangrene, not elsewhere classified: Secondary | ICD-10-CM | POA: Diagnosis not present

## 2023-05-02 LAB — CBC
HCT: 26.6 % — ABNORMAL LOW (ref 36.0–46.0)
Hemoglobin: 8.6 g/dL — ABNORMAL LOW (ref 12.0–15.0)
MCH: 30.9 pg (ref 26.0–34.0)
MCHC: 32.3 g/dL (ref 30.0–36.0)
MCV: 95.7 fL (ref 80.0–100.0)
Platelets: 263 10*3/uL (ref 150–400)
RBC: 2.78 MIL/uL — ABNORMAL LOW (ref 3.87–5.11)
RDW: 13.8 % (ref 11.5–15.5)
WBC: 10.9 10*3/uL — ABNORMAL HIGH (ref 4.0–10.5)
nRBC: 0 % (ref 0.0–0.2)

## 2023-05-02 LAB — BASIC METABOLIC PANEL
Anion gap: 8 (ref 5–15)
BUN: 18 mg/dL (ref 8–23)
CO2: 22 mmol/L (ref 22–32)
Calcium: 8.9 mg/dL (ref 8.9–10.3)
Chloride: 107 mmol/L (ref 98–111)
Creatinine, Ser: 0.67 mg/dL (ref 0.44–1.00)
GFR, Estimated: >60 mL/min (ref >60)
Glucose, Bld: 134 mg/dL — ABNORMAL HIGH (ref 70–99)
Potassium: 4.2 mmol/L (ref 3.5–5.1)
Sodium: 137 mmol/L (ref 135–145)

## 2023-05-02 LAB — GLUCOSE, CAPILLARY
Glucose-Capillary: 111 mg/dL — ABNORMAL HIGH (ref 70–99)
Glucose-Capillary: 194 mg/dL — ABNORMAL HIGH (ref 70–99)
Glucose-Capillary: 240 mg/dL — ABNORMAL HIGH (ref 70–99)
Glucose-Capillary: 168 mg/dL — ABNORMAL HIGH (ref 70–99)

## 2023-05-02 LAB — MAGNESIUM: Magnesium: 2.2 mg/dL (ref 1.7–2.4)

## 2023-05-02 LAB — PHOSPHORUS: Phosphorus: 3.1 mg/dL (ref 2.5–4.6)

## 2023-05-02 MED ORDER — OXYCODONE HCL 5 MG PO TABS
5.0000 mg | ORAL_TABLET | ORAL | Status: DC | PRN
  Administered 2023-05-02 – 2023-05-03 (×4): 5 mg via ORAL
  Filled 2023-05-02 (×4): qty 1

## 2023-05-02 MED ORDER — SODIUM CHLORIDE 0.9% FLUSH: 3.0000 mL | INTRAVENOUS | Status: DC | PRN

## 2023-05-02 MED ORDER — LISINOPRIL 20 MG PO TABS
20.0000 mg | ORAL_TABLET | Freq: Every day | ORAL | Status: DC
  Administered 2023-05-02 – 2023-05-04 (×3): 20 mg via ORAL
  Filled 2023-05-02 (×3): qty 1

## 2023-05-02 MED ORDER — SENNOSIDES-DOCUSATE SODIUM 8.6-50 MG PO TABS
2.0000 | ORAL_TABLET | Freq: Once | ORAL | Status: DC
  Filled 2023-05-02: qty 2

## 2023-05-02 MED ORDER — BISACODYL 10 MG RE SUPP: 10.0000 mg | Freq: Every day | RECTAL | Status: DC | PRN

## 2023-05-02 MED ORDER — CEPHALEXIN 500 MG PO CAPS
500.0000 mg | ORAL_CAPSULE | Freq: Three times a day (TID) | ORAL | Status: DC
  Administered 2023-05-02 – 2023-05-04 (×8): 500 mg via ORAL
  Filled 2023-05-02 (×8): qty 1

## 2023-05-02 NOTE — Plan of Care (Signed)

## 2023-05-02 NOTE — Plan of Care (Signed)

## 2023-05-02 NOTE — TOC Progression Note (Signed)
 Transition of Care [facility]) - Progression Note    Patient Details  Name: [name] MRN: [id number] Date of Birth: 10/25/1933  [facility]) CM/SW Contact  Rodney Langton, RN Phone Number: 05/02/2023, 1:46 PM  Clinical Narrative:     Per MD, patient medically ready to be discharged back to [facility].  Reached out to [name], notified that patient will have to wait until Monday as their pharmacy is closing and will not reopen until Monday. Will prepare for discharge on Monday.        Expected Discharge Plan and Services                                               Social Determinants of Health (SDOH) Interventions SDOH Screenings   Food Insecurity: Patient Unable To Answer (04/28/2023)  Housing: Patient Unable To Answer (04/28/2023)  Transportation Needs: Patient Unable To Answer (04/28/2023)  Utilities: Patient Unable To Answer (04/28/2023)  Social Connections: Unknown (04/28/2023)  Recent Concern: Social Connections - Socially Isolated (04/09/2023)  Tobacco Use: Low Risk  (05/01/2023)    Readmission Risk Interventions    05/01/2023    4:26 PM  Readmission Risk Prevention Plan  Transportation Screening Complete  HRI or Home Care Consult Complete  Social Work Consult for Recovery Care Planning/Counseling Complete  Medication Review Oceanographer) Complete

## 2023-05-02 NOTE — Progress Notes (Signed)
 PROGRESS NOTE    [name]   UEA:[id number] DOB: April 01, 1933  DOA: 04/28/2023 Date of Service: 05/02/23 which is hospital day 4  PCP: Gracelyn Nurse, MD    Hospital course / significant events:   HPI: [name] is a 89 y.o. female with medical history significant of dementia and left hemiparesis with inability to meaningfully use left upper and lower extermties.type 2 diabetes, diabetic polyneuropathy, essential hypertension, dyslipidemia, peripheral arterial disease.  She is ordinarily a resident of nursing home facility.  Patient is also under the guardianship of [employer] of Social Services represented by [name].  History limited due to patient's dementia. Patient apparently had been seen by podiatry as well as vascular surgery over the last wek prior to coming to the ED, and had been advised to come to the hospital due to gangrene of the left big toe and with the intent for amputation of the toe.  Patient finally presented to the ER 03/18.    03/18: admitted to hospitalist for gangrene, consulted podiatry and vascular surgery. Continued on Vanc, Zosyn. No sepsis.  03/19: plan for angio tomorrow  03/20:  angiography and balloon angioplasty 03/21: L foot ray amputation and heel debridement. Per Dr Excell Seltzer, leave dressing in place until clinic follow up.  03/22: dc iv abx, initiate keflex per podiatry recs, stable for discharge, facility can take her back Mon 03/24    Consultants:  Podiatry  Vascular Surgery   Procedures/Surgeries: 04/30/23 angiography and balloon angioplasty w/ Dr Wyn Quaker 05/01/23 L foot partial first ray amputation, L posterior lateral heel subcutaneous wound debridement w/ Dr Excell Seltzer      ASSESSMENT & PLAN:   Toe gangrene L hallux w/ osteomyelitis No sepsis  Early pressure sore to the left heel.  Today 05/02/23 is POD2 LLE angio Today 05/02/23 is POD1 L partial ray amputation and wound debridement  Vascular and Podiatry teams involved per  hospital course - currently signed off please reengage as needed  Abx: vancomycin, Zosyn --> podiatry recs for 7d course of Keflex (500 mg tid, final dose Fri 3/28 2200) and Dr Excell Seltzer to f/u on culture report and path report next week, make adjustments as needed need to wear prevalon boots all the time.  Pain control     Chronic pain:  oxycodone 5 mg every 4 hours as needed. Bowel regimen ordered with MiraLAX   Anemia, iron deficiency Likely some ABLA d/t surgeries  Continue iron supplements Monitor CBC Transfuse for <7 Hgb  Prior documented peripheral arterial disease:  Aspirin, statin, Plavix   Diabetes mellitus with neuropathy Insulin sliding scale   HTN  diastolic blood pressure is ranging from 49-59 at this time.   Initially reduced lisinopril, will increase today given BP improving/high    Malnutrition  Nutrition/Supplements Continue with feeding supplementation with Pro-Stat Continue with home vitamin B-12 ferrous sulfate and magnesium oxide    No concerns based on BMI: Body mass index is 21.8 kg/m.  Underweight - under 18  overweight - 25 to 29 obese - 30 or more Class 1 obesity: BMI of 30.0 to 34 Class 2 obesity: BMI of 35.0 to 39 Class 3 obesity: BMI of 40.0 to 49 Super Morbid Obesity: BMI 50-59 Super-super Morbid Obesity: BMI 60+ Significantly low or high BMI is associated with higher medical risk.  Weight management advised as adjunct to other disease management and risk reduction treatments    DVT prophylaxis: lovenox IV fluids: no continuous IV fluids  Nutrition: cardiac/carb diet  Central  lines / other devices: none  Code Status: spoke w/ guardian [name] 8:09 AM 04/29/23 and confirmed FULL CODE unless there is decompensation / new clinical development such that CPR/resuscitation would be futile, will leave full code and will update guardian if there is strong recommendation to change this.  ACP documentation reviewed: none on file in [facility]  [facility]  needs: return to SNF w/ rehab  Medical barriers to dispo: none, pt can return to facility when they can resume care              Subjective / Brief ROS:  Patient reports pain in L foot is better today  No other concerns or questions at this time    Family Communication:none at this time     Objective Findings:  Vitals:   05/02/23 0329 05/02/23 0500 05/02/23 0830 05/02/23 1028  BP: (!) 152/63  (!) 119/57 139/60  Pulse: 83  76   Resp: 20  18   Temp: 97.7 F (36.5 C)  98.2 F (36.8 C)   TempSrc: Oral  Oral   SpO2: 100%  100%   Weight:  66.9 kg    Height:        Intake/Output Summary (Last 24 hours) at 05/02/2023 1554 Last data filed at 05/02/2023 1414 Gross per 24 hour  Intake 1596.22 ml  Output 310 ml  Net 1286.22 ml   Filed Weights   04/30/23 1428 05/01/23 0413 05/02/23 0500  Weight: 65.4 kg 67.4 kg 66.9 kg    Examination:  Physical Exam Constitutional:      General: She is not in acute distress.    Appearance: She is not ill-appearing.  Cardiovascular:     Rate and Rhythm: Normal rate and regular rhythm.  Pulmonary:     Effort: Pulmonary effort is normal.     Breath sounds: Normal breath sounds.  Abdominal:     Palpations: Abdomen is soft.  Neurological:     Mental Status: She is alert. Mental status is at baseline.  Psychiatric:        Mood and Affect: Mood normal.        Behavior: Behavior normal.          Scheduled Medications:   ascorbic acid  500 mg Oral BID   aspirin EC  81 mg Oral Daily   atorvastatin  80 mg Oral QHS   cephALEXin  500 mg Oral Q8H   clopidogrel  75 mg Oral Daily   cyanocobalamin  1,000 mcg Oral Daily   enoxaparin (LOVENOX) injection  40 mg Subcutaneous Q24H   feeding supplement  237 mL Oral BID BM   feeding supplement (GLUCERNA SHAKE)  237 mL Oral TID BM   feeding supplement (PRO-STAT SUGAR FREE 64)  30 mL Oral BID   ferrous sulfate  325 mg Oral QODAY   insulin aspart  0-15 Units Subcutaneous TID WC    insulin aspart  0-5 Units Subcutaneous QHS   lisinopril  20 mg Oral Daily   magnesium oxide  200 mg Oral Daily   multivitamin with minerals  1 tablet Oral Daily   mupirocin ointment  1 Application Nasal BID   polyethylene glycol  17 g Oral Daily   senna-docusate  2 tablet Oral Once    Continuous Infusions:    PRN Medications:  acetaminophen **OR** acetaminophen, bisacodyl, mouth rinse, oxyCODONE, sodium chloride flush  Antimicrobials from admission:  Anti-infectives (From admission, onward)    Start     Dose/Rate Route Frequency Ordered Stop  05/02/23 0845  cephALEXin (KEFLEX) capsule 500 mg        500 mg Oral Every 8 hours 05/02/23 0753 05/09/23 0900   05/01/23 1930  vancomycin (VANCOREADY) IVPB 750 mg/150 mL  Status:  Discontinued        750 mg 150 mL/hr over 60 Minutes Intravenous Every 24 hours 04/30/23 2019 05/02/23 0753   04/30/23 1351  ceFAZolin (ANCEF) IVPB 2g/100 mL premix  Status:  Discontinued        2 g 200 mL/hr over 30 Minutes Intravenous 30 min pre-op 04/30/23 1351 04/30/23 1527   04/29/23 1600  vancomycin (VANCOREADY) IVPB 750 mg/150 mL  Status:  Discontinued        750 mg 150 mL/hr over 60 Minutes Intravenous Every 24 hours 04/28/23 2017 04/30/23 2019   04/29/23 0600  piperacillin-tazobactam (ZOSYN) IVPB 3.375 g  Status:  Discontinued        3.375 g 12.5 mL/hr over 240 Minutes Intravenous Every 8 hours 04/28/23 2017 05/02/23 0753   04/28/23 2130  vancomycin (VANCOCIN) IVPB 1000 mg/200 mL premix        1,000 mg 200 mL/hr over 60 Minutes Intravenous  Once 04/28/23 2114 04/28/23 2346   04/28/23 1830  vancomycin (VANCOCIN) IVPB 1000 mg/200 mL premix  Status:  Discontinued        1,000 mg 200 mL/hr over 60 Minutes Intravenous  Once 04/28/23 1818 04/28/23 2114   04/28/23 1830  piperacillin-tazobactam (ZOSYN) IVPB 3.375 g        3.375 g 100 mL/hr over 30 Minutes Intravenous  Once 04/28/23 1818 04/28/23 1957           Data Reviewed:  I have personally  reviewed the following...  CBC: Recent Labs  Lab 04/28/23 1532 04/29/23 0557 04/30/23 2313 05/02/23 0502  WBC 8.6 6.8 7.9 10.9*  NEUTROABS 5.3  --   --   --   HGB 10.2* 8.4* 8.5* 8.6*  HCT 32.8* 26.4* 27.1* 26.6*  MCV 98.2 96.0 99.6 95.7  PLT 457* 319 280 263   Basic Metabolic Panel: Recent Labs  Lab 04/28/23 1532 04/29/23 0557 04/30/23 0521 05/01/23 0507 05/02/23 0502  NA 139 135 138 135 137  K 4.7 4.0 4.3 4.7 4.2  CL 102 107 106 104 107  CO2 26 25 25 23 22   GLUCOSE 175* 95 100* 235* 134*  BUN 31* 29* 30* 23 18  CREATININE 0.66 0.53 0.67 0.68 0.67  CALCIUM 9.5 8.3* 8.6* 8.5* 8.9  MG  --   --  2.0 2.2 2.2  PHOS  --   --  2.8 3.2 3.1   GFR: Estimated Creatinine Clearance: 46.6 mL/min (by C-G formula based on SCr of 0.67 mg/dL). Liver Function Tests: Recent Labs  Lab 04/28/23 1532  AST 26  ALT 15  ALKPHOS 65  BILITOT 0.5  PROT 7.6  ALBUMIN 3.6   No results for input(s): "LIPASE", "AMYLASE" in the last 168 hours. No results for input(s): "AMMONIA" in the last 168 hours. Coagulation Profile: Recent Labs  Lab 04/29/23 0557  INR 1.3*   Cardiac Enzymes: No results for input(s): "CKTOTAL", "CKMB", "CKMBINDEX", "TROPONINI" in the last 168 hours. BNP (last 3 results) No results for input(s): "PROBNP" in the last 8760 hours. HbA1C: No results for input(s): "HGBA1C" in the last 72 hours. CBG: Recent Labs  Lab 05/01/23 1559 05/01/23 1721 05/01/23 2018 05/02/23 0816 05/02/23 1204  GLUCAP 101* 90 115* 111* 194*   Lipid Profile: No results for input(s): "CHOL", "HDL", "LDLCALC", "TRIG", "  CHOLHDL", "LDLDIRECT" in the last 72 hours. Thyroid Function Tests: No results for input(s): "TSH", "T4TOTAL", "FREET4", "T3FREE", "THYROIDAB" in the last 72 hours. Anemia Panel: No results for input(s): "VITAMINB12", "FOLATE", "FERRITIN", "TIBC", "IRON", "RETICCTPCT" in the last 72 hours. Most Recent Urinalysis On File:     Component Value Date/Time   COLORURINE  YELLOW (A) 04/09/2023 0337   APPEARANCEUR CLOUDY (A) 04/09/2023 0337   LABSPEC 1.016 04/09/2023 0337   PHURINE 5.0 04/09/2023 0337   GLUCOSEU 50 (A) 04/09/2023 0337   HGBUR NEGATIVE 04/09/2023 0337   BILIRUBINUR NEGATIVE 04/09/2023 0337   BILIRUBINUR neg 08/08/2019 0922   KETONESUR 5 (A) 04/09/2023 0337   PROTEINUR NEGATIVE 04/09/2023 0337   UROBILINOGEN 0.2 08/08/2019 0922   NITRITE NEGATIVE 04/09/2023 0337   LEUKOCYTESUR LARGE (A) 04/09/2023 0337   Sepsis Labs: @LABRCNTIP (procalcitonin:4,lacticidven:4) Microbiology: Recent Results (from the past 240 hours)  Surgical pcr screen     Status: Abnormal   Collection Time: 04/29/23  5:30 PM   Specimen: Nasal Mucosa; Nasal Swab  Result Value Ref Range Status   MRSA, PCR POSITIVE (A) NEGATIVE Final    Comment: RESULT CALLED TO, READ BACK BY AND VERIFIED WITH:  MICHELLE WATSON AT 2126 04/29/23 JG    Staphylococcus aureus POSITIVE (A) NEGATIVE Final    Comment: (NOTE) The Xpert SA Assay (FDA approved for NASAL specimens in patients 39 years of age and older), is one component of a comprehensive surveillance program. It is not intended to diagnose infection nor to guide or monitor treatment. Performed at [facility], [address] [place], [place] [address]   Aerobic/Anaerobic Culture w Gram Stain (surgical/deep wound)     Status: None (Preliminary result)   Collection Time: 05/01/23  4:38 PM   Specimen: Path Tissue  Result Value Ref Range Status   Specimen Description   Final    TISSUE Performed at [facility], [address] [place], [place] [address]    Special Requests   Final    GANGRENE LEFT FOOT Performed at [facility], [address] [place], [place] [address]    Gram Stain   Final    RARE WBC PRESENT, PREDOMINANTLY PMN FEW GRAM POSITIVE COCCI    Culture   Final    CULTURE REINCUBATED FOR BETTER GROWTH Performed at [facility], [address], [place], [place] [address]     Report Status PENDING  Incomplete      Radiology Studies last 3 days: PERIPHERAL VASCULAR CATHETERIZATION Result Date: 04/30/2023 See surgical note for result.  DG Toe Great Left Result Date: 04/28/2023 CLINICAL DATA:  Gangrene in great toe EXAM: LEFT GREAT TOE COMPARISON:  None Available. FINDINGS: Extensive cortical erosion in the head of the proximal phalanx left great toe, with height loss more marked laterally than medially. Focal areas of bone loss at the lateral margin base distal phalanx. There is resultant lateral angulation of the distal phalanx. Extensive vascular site calcifications are noted in the distal foot. IMPRESSION: 1. Extensive septic arthritis and osteomyelitis of the great toe as above. Electronically Signed   By: Corlis Leak M.D.   On: 04/28/2023 19:58          Sunnie Nielsen, DO [facility] 05/02/2023, 3:54 PM    Dictation software may have been used to generate the above note. Typos may occur and escape review in typed/dictated notes. Please contact Dr Lyn Hollingshead directly for clarity if needed.  Staff may message me via secure chat in Epic  but  this may not receive an immediate response,  please page me for urgent matters!  If 7PM-7AM, please contact night coverage [email or web address]

## 2023-05-02 NOTE — Progress Notes (Signed)
 Approximately 1630--1700--This RN bladder scanned pt d/t pt having no urine output this shift. Bladder scan showed approximately 375cc in bladder. Pt verbalized the need to void but feels "unable to." MD Caro Hight, orders placed for straight cath.  Straight cath performed by this RN and [place]fa, RN. Pt tolerated procedure well, sterility maintained. 450cc of urine output obtained.

## 2023-05-03 DIAGNOSIS — I96 Gangrene, not elsewhere classified: Secondary | ICD-10-CM | POA: Diagnosis not present

## 2023-05-03 LAB — GLUCOSE, CAPILLARY
Glucose-Capillary: 139 mg/dL — ABNORMAL HIGH (ref 70–99)
Glucose-Capillary: 152 mg/dL — ABNORMAL HIGH (ref 70–99)
Glucose-Capillary: 153 mg/dL — ABNORMAL HIGH (ref 70–99)
Glucose-Capillary: 134 mg/dL — ABNORMAL HIGH (ref 70–99)

## 2023-05-03 LAB — URINALYSIS, W/ REFLEX TO CULTURE (INFECTION SUSPECTED)
Bacteria, UA: NONE SEEN
Bilirubin Urine: NEGATIVE
Glucose, UA: NEGATIVE mg/dL
Hgb urine dipstick: NEGATIVE
Ketones, ur: NEGATIVE mg/dL
Nitrite: NEGATIVE
Protein, ur: NEGATIVE mg/dL
Specific Gravity, Urine: 1.02 (ref 1.005–1.030)
pH: 5 (ref 5.0–8.0)

## 2023-05-03 LAB — CREATININE, SERUM
Creatinine, Ser: 0.54 mg/dL (ref 0.44–1.00)
GFR, Estimated: >60 mL/min (ref >60)

## 2023-05-03 MED ORDER — CHLORHEXIDINE GLUCONATE CLOTH 2 % EX PADS
6.0000 | MEDICATED_PAD | Freq: Every day | CUTANEOUS | Status: DC
  Administered 2023-05-03 – 2023-05-04 (×2): 6 via TOPICAL

## 2023-05-03 NOTE — Progress Notes (Signed)
 Pt did not void through out shift. Bladder scan done see documentation. Intermittent in and out foley catheter performed x 1.Pt had 300 cc of UOP. Per order place indwelling catheter if in/out catheter x 3.

## 2023-05-03 NOTE — Plan of Care (Signed)

## 2023-05-03 NOTE — Progress Notes (Signed)
 PROGRESS NOTE    [name]   ZOX:[id number] DOB: Sep 26, 1933  DOA: 04/28/2023 Date of Service: 05/03/23 which is hospital day 5  PCP: Gracelyn Nurse, MD    Hospital course / significant events:   HPI: ALSACE [name] is a 89 y.o. female with medical history significant of dementia and left hemiparesis with inability to meaningfully use left upper and lower extermties.type 2 diabetes, diabetic polyneuropathy, essential hypertension, dyslipidemia, peripheral arterial disease.  She is ordinarily a resident of nursing home facility.  Patient is also under the guardianship of Department of Social Services represented by [name].  History limited due to patient's dementia. Patient apparently had been seen by podiatry as well as vascular surgery over the last wek prior to coming to the ED, and had been advised to come to the hospital due to gangrene of the left big toe and with the intent for amputation of the toe.  Patient finally presented to the ER 03/18.    03/18: admitted to hospitalist for gangrene, consulted podiatry and vascular surgery. Continued on Vanc, Zosyn. No sepsis.  03/19: plan for angio tomorrow  03/20:  angiography and balloon angioplasty 03/21: L foot ray amputation and heel debridement. Per Dr Excell Seltzer, leave dressing in place until clinic follow up.  03/22: dc iv abx, initiate keflex per podiatry recs, stable for discharge, facility can take her back [facility] 03/24 03/23: urinary retention, Foley placed.     Consultants:  Podiatry  Vascular Surgery   Procedures/Surgeries: 04/30/23 angiography and balloon angioplasty w/ Dr Wyn Quaker 05/01/23 L foot partial first ray amputation, L posterior lateral heel subcutaneous wound debridement w/ Dr Excell Seltzer      ASSESSMENT & PLAN:   Toe gangrene L hallux w/ osteomyelitis No sepsis  Early pressure sore to the left heel.  Today 05/02/23 is POD2 LLE angio Today 05/02/23 is POD1 L partial ray amputation and wound debridement   Vascular and Podiatry teams involved per hospital course - currently signed off please reengage as needed  Abx: vancomycin, Zosyn --> podiatry recs for 7d course of Keflex (500 mg tid, final dose Fri 3/28 2200) and Dr Excell Seltzer to f/u on culture report and path report next week, make adjustments as needed need to wear prevalon boots all the time.  Pain control     Chronic pain:  oxycodone 5 mg every 4 hours as needed. Bowel regimen ordered with MiraLAX   Anemia, iron deficiency Likely some ABLA d/t surgeries  Continue iron supplements Monitor CBC Transfuse for <7 Hgb  Prior documented peripheral arterial disease:  Aspirin, statin, Plavix   Diabetes mellitus with neuropathy Insulin sliding scale   HTN  diastolic blood pressure is ranging from 49-59 at this time.   Initially reduced lisinopril, will increase today given BP improving/high    Malnutrition  Nutrition/Supplements Continue with feeding supplementation with Pro-Stat Continue with home vitamin B-12 ferrous sulfate and magnesium oxide  Urinary retention Fail in/out cath Foley in place Void trial as able  No concerns based on BMI: Body mass index is 21.8 kg/m.  Underweight - under 18  overweight - 25 to 29 obese - 30 or more Class 1 obesity: BMI of 30.0 to 34 Class 2 obesity: BMI of 35.0 to 39 Class 3 obesity: BMI of 40.0 to 49 Super Morbid Obesity: BMI 50-59 Super-super Morbid Obesity: BMI 60+ Significantly low or high BMI is associated with higher medical risk.  Weight management advised as adjunct to other disease management and risk reduction treatments  DVT prophylaxis: lovenox IV fluids: no continuous IV fluids  Nutrition: cardiac/carb diet  Central lines / other devices: none  Code Status: spoke w/ guardian [name] 8:09 AM 04/29/23 and confirmed FULL CODE unless there is decompensation / new clinical development such that CPR/resuscitation would be futile, will leave full code and will update guardian  if there is strong recommendation to change this.  ACP documentation reviewed: none on file in [facility]  TOC needs: return to SNF w/ rehab  Medical barriers to dispo: none, pt can return to facility when they can resume care              Subjective / Brief ROS:  Patient has no concerns today     Family Communication:none at this time     Objective Findings:  Vitals:   05/02/23 1952 05/03/23 0326 05/03/23 0500 05/03/23 0814  BP: (!) 146/65 (!) 121/48  (!) 155/75  Pulse: 92 74  92  Resp:  17  18  Temp: (!) 97.5 F (36.4 C) 98.9 F (37.2 C)  99.3 F (37.4 C)  TempSrc: Oral Oral  Oral  SpO2: 100%   97%  Weight:   66.3 kg   Height:        Intake/Output Summary (Last 24 hours) at 05/03/2023 1442 Last data filed at 05/03/2023 1016 Gross per 24 hour  Intake --  Output 750 ml  Net -750 ml   Filed Weights   05/01/23 0413 05/02/23 0500 05/03/23 0500  Weight: 67.4 kg 66.9 kg 66.3 kg    Examination:  Physical Exam Constitutional:      General: She is not in acute distress.    Appearance: She is not ill-appearing.  Cardiovascular:     Rate and Rhythm: Normal rate and regular rhythm.  Pulmonary:     Effort: Pulmonary effort is normal.     Breath sounds: Normal breath sounds.  Abdominal:     Palpations: Abdomen is soft.  Neurological:     Mental Status: She is alert. Mental status is at baseline.  Psychiatric:        Mood and Affect: Mood normal.        Behavior: Behavior normal.          Scheduled Medications:   ascorbic acid  500 mg Oral BID   aspirin EC  81 mg Oral Daily   atorvastatin  80 mg Oral QHS   cephALEXin  500 mg Oral Q8H   Chlorhexidine Gluconate Cloth  6 each Topical Daily   clopidogrel  75 mg Oral Daily   cyanocobalamin  1,000 mcg Oral Daily   enoxaparin (LOVENOX) injection  40 mg Subcutaneous Q24H   feeding supplement  237 mL Oral BID BM   feeding supplement (GLUCERNA SHAKE)  237 mL Oral TID BM   feeding supplement (PRO-STAT SUGAR  FREE 64)  30 mL Oral BID   ferrous sulfate  325 mg Oral QODAY   insulin aspart  0-15 Units Subcutaneous TID WC   insulin aspart  0-5 Units Subcutaneous QHS   lisinopril  20 mg Oral Daily   magnesium oxide  200 mg Oral Daily   multivitamin with minerals  1 tablet Oral Daily   mupirocin ointment  1 Application Nasal BID   polyethylene glycol  17 g Oral Daily   senna-docusate  2 tablet Oral Once    Continuous Infusions:    PRN Medications:  acetaminophen **OR** acetaminophen, bisacodyl, mouth rinse, oxyCODONE, sodium chloride flush  Antimicrobials from admission:  Anti-infectives (From  admission, onward)    Start     Dose/Rate Route Frequency Ordered Stop   05/02/23 0845  cephALEXin (KEFLEX) capsule 500 mg        500 mg Oral Every 8 hours 05/02/23 0753 05/09/23 0900   05/01/23 1930  vancomycin (VANCOREADY) IVPB 750 mg/150 mL  Status:  Discontinued        750 mg 150 mL/hr over 60 Minutes Intravenous Every 24 hours 04/30/23 2019 05/02/23 0753   04/30/23 1351  ceFAZolin (ANCEF) IVPB 2g/100 mL premix  Status:  Discontinued        2 g 200 mL/hr over 30 Minutes Intravenous 30 min pre-op 04/30/23 1351 04/30/23 1527   04/29/23 1600  vancomycin (VANCOREADY) IVPB 750 mg/150 mL  Status:  Discontinued        750 mg 150 mL/hr over 60 Minutes Intravenous Every 24 hours 04/28/23 2017 04/30/23 2019   04/29/23 0600  piperacillin-tazobactam (ZOSYN) IVPB 3.375 g  Status:  Discontinued        3.375 g 12.5 mL/hr over 240 Minutes Intravenous Every 8 hours 04/28/23 2017 05/02/23 0753   04/28/23 2130  vancomycin (VANCOCIN) IVPB 1000 mg/200 mL premix        1,000 mg 200 mL/hr over 60 Minutes Intravenous  Once 04/28/23 2114 04/28/23 2346   04/28/23 1830  vancomycin (VANCOCIN) IVPB 1000 mg/200 mL premix  Status:  Discontinued        1,000 mg 200 mL/hr over 60 Minutes Intravenous  Once 04/28/23 1818 04/28/23 2114   04/28/23 1830  piperacillin-tazobactam (ZOSYN) IVPB 3.375 g        3.375 g 100 mL/hr  over 30 Minutes Intravenous  Once 04/28/23 1818 04/28/23 1957           Data Reviewed:  I have personally reviewed the following...  CBC: Recent Labs  Lab 04/28/23 1532 04/29/23 0557 04/30/23 2313 05/02/23 0502  WBC 8.6 6.8 7.9 10.9*  NEUTROABS 5.3  --   --   --   HGB 10.2* 8.4* 8.5* 8.6*  HCT 32.8* 26.4* 27.1* 26.6*  MCV 98.2 96.0 99.6 95.7  PLT 457* 319 280 263   Basic Metabolic Panel: Recent Labs  Lab 04/28/23 1532 04/29/23 0557 04/30/23 0521 05/01/23 0507 05/02/23 0502 05/03/23 0558  NA 139 135 138 135 137  --   K 4.7 4.0 4.3 4.7 4.2  --   CL 102 107 106 104 107  --   CO2 26 25 25 23 22   --   GLUCOSE 175* 95 100* 235* 134*  --   BUN 31* 29* 30* 23 18  --   CREATININE 0.66 0.53 0.67 0.68 0.67 0.54  CALCIUM 9.5 8.3* 8.6* 8.5* 8.9  --   MG  --   --  2.0 2.2 2.2  --   PHOS  --   --  2.8 3.2 3.1  --    GFR: Estimated Creatinine Clearance: 46.4 mL/min (by C-G formula based on SCr of 0.54 mg/dL). Liver Function Tests: Recent Labs  Lab 04/28/23 1532  AST 26  ALT 15  ALKPHOS 65  BILITOT 0.5  PROT 7.6  ALBUMIN 3.6   No results for input(s): "LIPASE", "AMYLASE" in the last 168 hours. No results for input(s): "AMMONIA" in the last 168 hours. Coagulation Profile: Recent Labs  Lab 04/29/23 0557  INR 1.3*   Cardiac Enzymes: No results for input(s): "CKTOTAL", "CKMB", "CKMBINDEX", "TROPONINI" in the last 168 hours. BNP (last 3 results) No results for input(s): "PROBNP" in the last  8760 hours. HbA1C: No results for input(s): "HGBA1C" in the last 72 hours. CBG: Recent Labs  Lab 05/02/23 1204 05/02/23 1723 05/02/23 2047 05/03/23 0809 05/03/23 1211  GLUCAP 194* 240* 168* 139* 152*   Lipid Profile: No results for input(s): "CHOL", "HDL", "LDLCALC", "TRIG", "CHOLHDL", "LDLDIRECT" in the last 72 hours. Thyroid Function Tests: No results for input(s): "TSH", "T4TOTAL", "FREET4", "T3FREE", "THYROIDAB" in the last 72 hours. Anemia Panel: No results  for input(s): "VITAMINB12", "FOLATE", "FERRITIN", "TIBC", "IRON", "RETICCTPCT" in the last 72 hours. Most Recent Urinalysis On File:     Component Value Date/Time   COLORURINE YELLOW (A) 05/03/2023 1329   APPEARANCEUR HAZY (A) 05/03/2023 1329   LABSPEC 1.020 05/03/2023 1329   PHURINE 5.0 05/03/2023 1329   GLUCOSEU NEGATIVE 05/03/2023 1329   HGBUR NEGATIVE 05/03/2023 1329   BILIRUBINUR NEGATIVE 05/03/2023 1329   BILIRUBINUR neg 08/08/2019 0922   KETONESUR NEGATIVE 05/03/2023 1329   PROTEINUR NEGATIVE 05/03/2023 1329   UROBILINOGEN 0.2 08/08/2019 0922   NITRITE NEGATIVE 05/03/2023 1329   LEUKOCYTESUR TRACE (A) 05/03/2023 1329   Sepsis Labs: @LABRCNTIP (procalcitonin:4,lacticidven:4) Microbiology: Recent Results (from the past 240 hours)  Surgical pcr screen     Status: Abnormal   Collection Time: 04/29/23  5:30 PM   Specimen: Nasal Mucosa; Nasal Swab  Result Value Ref Range Status   MRSA, PCR POSITIVE (A) NEGATIVE Final    Comment: RESULT CALLED TO, READ BACK BY AND VERIFIED WITH:  MICHELLE WATSON AT 2126 04/29/23 JG    Staphylococcus aureus POSITIVE (A) NEGATIVE Final    Comment: (NOTE) The Xpert SA Assay (FDA approved for NASAL specimens in patients 4 years of age and older), is one component of a comprehensive surveillance program. It is not intended to diagnose infection nor to guide or monitor treatment. Performed at [facility], [address], [place], [place] [address]   Aerobic/Anaerobic Culture w Gram Stain (surgical/deep wound)     Status: None (Preliminary result)   Collection Time: 05/01/23  4:38 PM   Specimen: Path Tissue  Result Value Ref Range Status   Specimen Description   Final    TISSUE Performed at [facility], [address] [place], [place] [address]    Special Requests   Final    GANGRENE LEFT FOOT Performed at [facility], [address] [place], [place] [address]    Gram Stain   Final    RARE WBC PRESENT,  PREDOMINANTLY PMN FEW GRAM POSITIVE COCCI    Culture   Final    ABUNDANT STAPHYLOCOCCUS AUREUS MODERATE GRAM NEGATIVE RODS CULTURE REINCUBATED FOR BETTER GROWTH Performed at [facility], [address] [place], [place] [address]    Report Status PENDING  Incomplete      Radiology Studies last 3 days: PERIPHERAL VASCULAR CATHETERIZATION Result Date: 04/30/2023 See surgical note for result.         Sunnie Nielsen, DO [facility] 05/03/2023, 2:42 PM    Dictation software may have been used to generate the above note. Typos may occur and escape review in typed/dictated notes. Please contact Dr Lyn Hollingshead directly for clarity if needed.  Staff may message me via secure chat in Epic  but this may not receive an immediate response,  please page me for urgent matters!  If 7PM-7AM, please contact night coverage [email or web address]

## 2023-05-04 DIAGNOSIS — I96 Gangrene, not elsewhere classified: Secondary | ICD-10-CM | POA: Diagnosis not present

## 2023-05-04 LAB — GLUCOSE, CAPILLARY
Glucose-Capillary: 114 mg/dL — ABNORMAL HIGH (ref 70–99)
Glucose-Capillary: 231 mg/dL — ABNORMAL HIGH (ref 70–99)

## 2023-05-04 MED ORDER — BISACODYL 10 MG RE SUPP: 10.0000 mg | Freq: Every day | RECTAL | Status: DC | PRN

## 2023-05-04 MED ORDER — CEPHALEXIN 500 MG PO CAPS: 500.0000 mg | ORAL_CAPSULE | Freq: Three times a day (TID) | ORAL | Status: AC

## 2023-05-04 MED ORDER — GLUCERNA SHAKE PO LIQD: 237.0000 mL | Freq: Three times a day (TID) | ORAL | Status: DC

## 2023-05-04 MED ORDER — OXYCODONE HCL 5 MG PO TABS: 5.0000 mg | ORAL_TABLET | Freq: Three times a day (TID) | ORAL | 0 refills | Status: DC | PRN

## 2023-05-04 MED ORDER — INSULIN ASPART 100 UNIT/ML IJ SOLN: INTRAMUSCULAR | Status: DC

## 2023-05-04 NOTE — NC FL2 (Signed)
 [place] MEDICAID FL2 LEVEL OF CARE FORM     IDENTIFICATION  Patient Name: [name] Birthdate: 22-Feb-1933 Sex: female Admission Date (Current Location): 04/28/2023  [facility] and [place] Number:  Chiropodist and Address:         Provider Number: [id number]  Attending Physician Name and Address:  Sunnie Nielsen, DO  Relative Name and Phone Number:       Current Level of Care: Hospital Recommended Level of Care: Nursing Facility Prior Approval Number:    Date Approved/Denied:   PASRR Number: [id number]  Discharge Plan: SNF    Current Diagnoses: Patient Active Problem List   Diagnosis Date Noted   Protein-calorie malnutrition, severe 04/30/2023   Toe gangrene (HCC) 04/28/2023   Suspected UTI 04/09/2023   Dementia (HCC) 04/09/2023   DJD (degenerative joint disease) 01/21/2023   Ischemic foot 08/27/2022   Osteomyelitis of left foot (HCC) 08/27/2022   Gangrene of toe of left foot (HCC) 08/26/2022   Acute osteomyelitis of left foot (HCC) 08/08/2022   Diabetic foot infection (HCC) 08/07/2022   Hemiplegia affecting left side in left-dominant patient as late effect of cerebrovascular disease (HCC) 01/10/2022   Vaginal candidiasis 01/10/2022   Leg wound, left 01/08/2022   Hypoglycemia 01/07/2022   Leukocytosis 01/07/2022   MRI contraindicated due to metal implant 01/07/2022   Open wound of left foot 01/07/2022   Atherosclerosis of native arteries of the extremities with ulceration (HCC) 06/06/2021   Pressure injury of skin 06/01/2021   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   PAD (peripheral artery disease) (HCC) 05/17/2021   Right hemiparesis (HCC) 05/16/2021   Cellulitis of left foot 05/15/2021   Hypokalemia 05/15/2021   CVA (cerebral vascular accident) (HCC) 11/14/2019   History of stroke 10/07/2019   Altered mental status 10/06/2019   Carotid artery disease (HCC) 09/28/2019   Left hemiparesis (HCC) 09/28/2019   Hemiplegia and hemiparesis  following cerebral infarction affecting right dominant side (HCC) 09/27/2019   UTI (urinary tract infection) 09/20/2019   Sepsis (HCC) 03/22/2018   Primary osteoarthritis of both hips 11/06/2017   Microalbuminuria 11/02/2017   Cystocele, midline 09/04/2017   Incomplete uterine prolapse 09/04/2017   Type 2 diabetes mellitus with microalbuminuria, without long-term current use of insulin (HCC) 07/27/2017   Ankle edema 04/27/2017   Pure hypercholesterolemia 03/30/2016   Malnutrition of moderate degree 01/15/2016   Dehydration    Incarcerated ventral hernia    Diabetic ketoacidosis without coma associated with type 2 diabetes mellitus (HCC)    Type 2 diabetes mellitus with polyneuropathy (HCC) 08/06/2015   Carpal tunnel syndrome, bilateral 07/10/2015   Osteopenia of multiple sites 07/10/2015   Arthralgia of multiple sites 06/27/2015   Mixed hyperlipidemia 11/06/2014   Hypomagnesemia 11/02/2013   Essential hypertension 11/02/2013    Orientation RESPIRATION BLADDER Height & Weight     Self  Normal Indwelling catheter Weight: 68.6 kg Height:  5\' 4"  (162.6 cm)  BEHAVIORAL SYMPTOMS/MOOD NEUROLOGICAL BOWEL NUTRITION STATUS      Continent Diet (Carb modified)  AMBULATORY STATUS COMMUNICATION OF NEEDS Skin   Total Care Verbally Surgical wounds                       Personal Care Assistance Level of Assistance              Functional Limitations Info             SPECIAL CARE FACTORS FREQUENCY  Contractures Contractures Info: Not present    Additional Factors Info  Code Status, Allergies Code Status Info: full Allergies Info: Metformin and related           Current Medications (05/04/2023):  This is the current hospital active medication list Current Facility-Administered Medications  Medication Dose Route Frequency Provider Last Rate Last Admin   acetaminophen (TYLENOL) tablet 650 mg  650 mg Oral Q6H PRN Rosetta Posner, DPM   650 mg  at 05/04/23 0818   Or   acetaminophen (TYLENOL) suppository 650 mg  650 mg Rectal Q6H PRN Rosetta Posner, DPM       ascorbic acid (VITAMIN C) tablet 500 mg  500 mg Oral BID Rosetta Posner, DPM   500 mg at 05/04/23 9147   aspirin EC tablet 81 mg  81 mg Oral Daily Rosetta Posner, DPM   81 mg at 05/04/23 0817   atorvastatin (LIPITOR) tablet 80 mg  80 mg Oral QHS Rosetta Posner, DPM   80 mg at 05/03/23 2156   bisacodyl (DULCOLAX) suppository 10 mg  10 mg Rectal Daily PRN Sunnie Nielsen, DO       cephALEXin [facility]) capsule 500 mg  500 mg Oral Q8H Sunnie Nielsen, DO   500 mg at 05/04/23 0522   Chlorhexidine Gluconate Cloth 2 % PADS 6 each  6 each Topical Daily Sunnie Nielsen, DO   6 each at 05/03/23 1334   clopidogrel (PLAVIX) tablet 75 mg  75 mg Oral Daily Rosetta Posner, DPM   75 mg at 05/04/23 0820   cyanocobalamin (VITAMIN B12) tablet 1,000 mcg  1,000 mcg Oral Daily Rosetta Posner, DPM   1,000 mcg at 05/04/23 0821   enoxaparin (LOVENOX) injection 40 mg  40 mg Subcutaneous Q24H Rosetta Posner, DPM   40 mg at 05/04/23 8295   feeding supplement (GLUCERNA SHAKE) (GLUCERNA SHAKE) liquid 237 mL  237 mL Oral TID BM Rosetta Posner, DPM   237 mL at 05/04/23 6213   feeding supplement (PRO-STAT SUGAR FREE 64) liquid 30 mL  30 mL Oral BID Rosetta Posner, DPM   30 mL at 05/04/23 0865   ferrous sulfate tablet 325 mg  325 mg Oral Bethann Punches, DPM   325 mg at 05/03/23 7846   insulin aspart (novoLOG) injection 0-15 Units  0-15 Units Subcutaneous TID WC Rosetta Posner, DPM   3 Units at 05/03/23 1718   insulin aspart (novoLOG) injection 0-5 Units  0-5 Units Subcutaneous QHS Rosetta Posner, DPM       lisinopril (ZESTRIL) tablet 20 mg  20 mg Oral Daily Sunnie Nielsen, DO   20 mg at 05/04/23 9629   magnesium oxide (MAG-OX) tablet 200 mg  200 mg Oral Daily Rosetta Posner, DPM   200 mg at 05/04/23 0820   multivitamin with minerals tablet 1 tablet  1 tablet Oral Daily Rosetta Posner, DPM   1 tablet at 05/04/23  0818   Oral care mouth rinse  15 mL Mouth Rinse PRN Rosetta Posner, DPM       oxyCODONE (Oxy IR/ROXICODONE) immediate release tablet 5 mg  5 mg Oral Q4H PRN Sunnie Nielsen, DO   5 mg at 05/03/23 1841   polyethylene glycol (MIRALAX / GLYCOLAX) packet 17 g  17 g Oral Daily Rosetta Posner, DPM   17 g at 05/04/23 0830   senna-docusate (Senokot-S) tablet 2 tablet  2 tablet Oral Once Sunnie Nielsen, DO       sodium chloride flush (NS) 0.9 % injection 3 mL  3 mL Intravenous  PRN Sunnie Nielsen, DO         Discharge Medications: Please see discharge summary for a list of discharge medications.  Relevant Imaging Results:  Relevant Lab Results:   Additional Information SS #: [id number]  Chapman Fitch, RN

## 2023-05-04 NOTE — Progress Notes (Addendum)
 [name], DSS guardian notified that the patient is returning to [facility]

## 2023-05-04 NOTE — Plan of Care (Signed)

## 2023-05-04 NOTE — Discharge Summary (Signed)
 Physician Discharge Summary   Patient: [name] MRN: [id number]  DOB: [birth year]   Admit:     Date of Admission: 04/28/2023 Admitted from: SNF   Discharge: Date of discharge: 05/04/23 Disposition: Skilled nursing facility Condition at discharge: good  CODE STATUS: FULL CODE     Discharge Physician: Sunnie Nielsen, DO [facility]     PCP: Gracelyn Nurse, MD  Recommendations for Outpatient Follow-up:  Follow up with PCP Gracelyn Nurse, MD in 2-4 weeks Follow up with Dr Excell Seltzer (Podiatry) in 1 week or sooner as needed for wound check / surgical follow up  Voiding trial - remove Foley next 1-2 days and if still retaining please arrange urology follow-up    Discharge Instructions     Call MD for:  redness, tenderness, or signs of infection (pain, swelling, redness, odor or green/yellow discharge around incision site)   Complete by: As directed    Diet - low sodium heart healthy   Complete by: As directed    Discharge wound care:   Complete by: As directed    Keep dressing in place until podiatry follow up unless severe pain, bandage becomes soiled, or other concerns w/ surgical site, in which case please reach out to podiatrist   Increase activity slowly   Complete by: As directed          Discharge Diagnoses: Principal Problem:   Toe gangrene (HCC) Active Problems:   Gangrene of toe of left foot (HCC)   Protein-calorie malnutrition, severe    Hospital course / significant events:   HPI: [name] is a 89 y.o. female with medical history significant of dementia and left hemiparesis with inability to meaningfully use left upper and lower extermties.type 2 diabetes, diabetic polyneuropathy, essential hypertension, dyslipidemia, peripheral arterial disease.  She is ordinarily a resident of nursing home facility.  Patient is also under the guardianship of Department of Social Services represented by [name].  History limited due to  patient's dementia. Patient apparently had been seen by podiatry as well as vascular surgery over the last wek prior to coming to the ED, and had been advised to come to the hospital due to gangrene of the left big toe and with the intent for amputation of the toe.  Patient finally presented to the ER 03/18.    03/18: admitted to hospitalist for gangrene, consulted podiatry and vascular surgery. Continued on Vanc, Zosyn. No sepsis.  03/19: plan for angio tomorrow  03/20:  angiography and balloon angioplasty 03/21: L foot ray amputation and heel debridement. Per Dr Excell Seltzer, leave dressing in place until clinic follow up.  03/22: dc iv abx, initiate keflex per podiatry recs, stable for discharge, facility can take her back [facility] 03/24 03/23: urinary retention, Foley placed, UA ok.  03/24: stable for discharge to her facility.     Consultants:  Podiatry  Vascular Surgery   Procedures/Surgeries: 04/30/23 angiography and balloon angioplasty w/ Dr Wyn Quaker 05/01/23 L foot partial first ray amputation, L posterior lateral heel subcutaneous wound debridement w/ Dr Excell Seltzer      ASSESSMENT & PLAN:   Toe gangrene L hallux w/ osteomyelitis No sepsis  Early pressure sore to the left heel.  Today 05/02/23 is POD2 LLE angio Today 05/02/23 is POD1 L partial ray amputation and wound debridement  Vascular and Podiatry teams involved per hospital course - currently signed off please reengage as needed  Abx: vancomycin, Zosyn --> podiatry recs for 7d course of Keflex (500  mg tid, final dose Fri 3/28 2200) and Dr Excell Seltzer to f/u on culture report and path report next week, make adjustments as needed need to wear prevalon boots all the time.  Pain control     Chronic pain:  oxycodone 5 mg prn Bowel regimen ordered with MiraLAX   Anemia, iron deficiency Likely some ABLA d/t surgeries  Continue iron supplements Monitor CBC  Prior documented peripheral arterial disease:  Aspirin, statin, Plavix   Diabetes  mellitus with neuropathy Insulin sliding scale   HTN  diastolic blood pressure is ranging from 49-59 at this time.   Meds as below    Malnutrition  Nutrition/Supplements Continue with feeding supplementation with Pro-Stat Continue with home vitamin B-12 ferrous sulfate and magnesium oxide  Urinary retention Fail in/out cath Foley in place Void trial as able  No concerns based on BMI: Body mass index is 21.8 kg/m.  Underweight - under 18  overweight - 25 to 29 obese - 30 or more Class 1 obesity: BMI of 30.0 to 34 Class 2 obesity: BMI of 35.0 to 39 Class 3 obesity: BMI of 40.0 to 49 Super Morbid Obesity: BMI 50-59 Super-super Morbid Obesity: BMI 60+ Significantly low or high BMI is associated with higher medical risk.  Weight management advised as adjunct to other disease management and risk reduction treatments            Discharge Instructions  Allergies as of 05/04/2023       Reactions   Metformin And Related Nausea And Vomiting        Medication List     STOP taking these medications    Semglee 100 UNIT/ML injection Generic drug: insulin glargine       TAKE these medications    acetaminophen 325 MG tablet Commonly known as: TYLENOL Take 2 tablets (650 mg total) by mouth every 6 (six) hours as needed for mild pain, moderate pain, fever or headache. What changed: additional instructions   ascorbic acid 500 MG tablet Commonly known as: VITAMIN C Take 500 mg by mouth 2 (two) times daily.   aspirin EC 81 MG tablet Take 81 mg by mouth daily. Swallow whole.   atorvastatin 80 MG tablet Commonly known as: LIPITOR Take 1 tablet (80 mg total) by mouth daily. What changed: when to take this   bisacodyl 10 MG suppository Commonly known as: DULCOLAX Place 1 suppository (10 mg total) rectally daily as needed for severe constipation.   cephALEXin 500 MG capsule Commonly known as: KEFLEX Take 1 capsule (500 mg total) by mouth every 8 (eight) hours  for 5 days.   clopidogrel 75 MG tablet Commonly known as: PLAVIX Take 1 tablet (75 mg total) by mouth daily.   cyanocobalamin 1000 MCG tablet Commonly known as: VITAMIN B12 Take 1,000 mcg by mouth daily.   feeding supplement (GLUCERNA SHAKE) Liqd Take 237 mLs by mouth 3 (three) times daily between meals.   feeding supplement (PRO-STAT SUGAR FREE 64) Liqd Take 30 mLs by mouth in the morning and at bedtime.   ferrous sulfate 325 (65 FE) MG tablet Take 325 mg by mouth every other day.   Glucagon Emergency 1 MG Kit Inject 1 mg into the muscle once as needed (low blood sugar).   hydrALAZINE 25 MG tablet Commonly known as: APRESOLINE Take 25 mg by mouth daily as needed (SBP> 160). PRN   insulin aspart 100 UNIT/ML injection Commonly known as: novoLOG CBG 70 - 120: 0 units CBG 121 - 150: 2 units CBG  151 - 200: 3 units CBG 201 - 250: 5 units CBG 251 - 300: 8 units CBG 301 - 350: 11 units CBG 351 - 400: 15 units CBG > 400: call MD What changed:  how much to take how to take this when to take this additional instructions   lisinopril 20 MG tablet Commonly known as: ZESTRIL Take 20 mg by mouth daily.   magnesium oxide 400 (240 Mg) MG tablet Commonly known as: MAG-OX Take 400 mg by mouth daily.   multivitamin with minerals tablet Take 1 tablet by mouth daily.   naloxone 4 MG/0.1ML Liqd nasal spray kit Commonly known as: NARCAN Place 1 spray into the nose 3 (three) times daily as needed (opioid overodse).   oxyCODONE 5 MG immediate release tablet Commonly known as: Oxy IR/ROXICODONE Take 1 tablet (5 mg total) by mouth 3 (three) times daily as needed for severe pain (pain score 7-10). What changed:  when to take this reasons to take this   polyethylene glycol 17 g packet Commonly known as: MIRALAX / GLYCOLAX Take 17 g by mouth daily.   Vitamin D (Ergocalciferol) 1.25 MG (50000 UNIT) Caps capsule Commonly known as: DRISDOL Take 50,000 Units by mouth every 7 (seven)  days. thursday               Discharge Care Instructions  (From admission, onward)           Start     Ordered   05/04/23 0000  Discharge wound care:       Comments: Keep dressing in place until podiatry follow up unless severe pain, bandage becomes soiled, or other concerns w/ surgical site, in which case please reach out to podiatrist   05/04/23 0930             Follow-up Information     Rosetta Posner, DPM. Schedule an appointment as soon as possible for a visit in 1 week(s).   Specialty: Podiatry Why: For wound re-check Contact information: [address] [place] [address] [phone]                 Allergies  Allergen Reactions   Metformin And Related Nausea And Vomiting     Subjective: pt reports no complaints this morning, she is feeling well, foot pain is not bothering her, tolerating diet, no nausea, no abd pain, no CP/SOB   Discharge Exam: BP (!) 145/77 (BP Location: Right Arm)   Pulse 84   Temp 97.8 F (36.6 C) (Oral)   Resp 18   Ht 5\' 4"  (1.626 m)   Wt 68.6 kg   SpO2 100%   BMI 25.96 kg/m  General: Pt is alert, awake, not in acute distress Cardiovascular: RRR, S1/S2 +, no rubs, no gallops Respiratory: CTA bilaterally, no wheezing, no rhonchi Abdominal: Soft, NT, ND, bowel sounds +WNL Extremities: no edema, no cyanosis. LLE bandaged, not disturbed, proximal to bandage skin is warm, intact, no edema or tenderness      The results of significant diagnostics from this hospitalization (including imaging, microbiology, ancillary and laboratory) are listed below for reference.     Microbiology: Recent Results (from the past 240 hours)  Surgical pcr screen     Status: Abnormal   Collection Time: 04/29/23  5:30 PM   Specimen: Nasal Mucosa; Nasal Swab  Result Value Ref Range Status   MRSA, PCR POSITIVE (A) NEGATIVE Final    Comment: RESULT CALLED TO, READ BACK BY AND VERIFIED WITH:  MICHELLE WATSON AT  2126 04/29/23  JG    Staphylococcus aureus POSITIVE (A) NEGATIVE Final    Comment: (NOTE) The Xpert SA Assay (FDA approved for NASAL specimens in patients 68 years of age and older), is one component of a comprehensive surveillance program. It is not intended to diagnose infection nor to guide or monitor treatment. Performed at [facility], [address] [place], [place] [address]   Aerobic/Anaerobic Culture w Gram Stain (surgical/deep wound)     Status: None (Preliminary result)   Collection Time: 05/01/23  4:38 PM   Specimen: Path Tissue  Result Value Ref Range Status   Specimen Description   Final    TISSUE Performed at [facility], [address] [place], [place] [address]    Special Requests   Final    GANGRENE LEFT FOOT Performed at [facility], [address] [place], [place] [address]    Gram Stain   Final    RARE WBC PRESENT, PREDOMINANTLY PMN FEW GRAM POSITIVE COCCI Performed at [facility], [address], [place], [place] [address]    Culture   Final    ABUNDANT STAPHYLOCOCCUS AUREUS MODERATE GRAM NEGATIVE RODS CULTURE REINCUBATED FOR BETTER GROWTH SUSCEPTIBILITIES TO FOLLOW NO ANAEROBES ISOLATED; CULTURE IN PROGRESS FOR 5 DAYS    Report Status PENDING  Incomplete     Labs: BNP (last 3 results) No results for input(s): "BNP" in the last 8760 hours. Basic Metabolic Panel: Recent Labs  Lab 04/28/23 1532 04/29/23 0557 04/30/23 0521 05/01/23 0507 05/02/23 0502 05/03/23 0558  NA 139 135 138 135 137  --   K 4.7 4.0 4.3 4.7 4.2  --   CL 102 107 106 104 107  --   CO2 26 25 25 23 22   --   GLUCOSE 175* 95 100* 235* 134*  --   BUN 31* 29* 30* 23 18  --   CREATININE 0.66 0.53 0.67 0.68 0.67 0.54  CALCIUM 9.5 8.3* 8.6* 8.5* 8.9  --   MG  --   --  2.0 2.2 2.2  --   PHOS  --   --  2.8 3.2 3.1  --    Liver Function Tests: Recent Labs  Lab 04/28/23 1532  AST 26  ALT 15  ALKPHOS 65  BILITOT 0.5  PROT 7.6  ALBUMIN 3.6   No  results for input(s): "LIPASE", "AMYLASE" in the last 168 hours. No results for input(s): "AMMONIA" in the last 168 hours. CBC: Recent Labs  Lab 04/28/23 1532 04/29/23 0557 04/30/23 2313 05/02/23 0502  WBC 8.6 6.8 7.9 10.9*  NEUTROABS 5.3  --   --   --   HGB 10.2* 8.4* 8.5* 8.6*  HCT 32.8* 26.4* 27.1* 26.6*  MCV 98.2 96.0 99.6 95.7  PLT 457* 319 280 263   Cardiac Enzymes: No results for input(s): "CKTOTAL", "CKMB", "CKMBINDEX", "TROPONINI" in the last 168 hours. BNP: Invalid input(s): "POCBNP" CBG: Recent Labs  Lab 05/03/23 0809 05/03/23 1211 05/03/23 1704 05/03/23 2200 05/04/23 0808  GLUCAP 139* 152* 153* 134* 114*   D-Dimer No results for input(s): "DDIMER" in the last 72 hours. Hgb A1c No results for input(s): "HGBA1C" in the last 72 hours. Lipid Profile No results for input(s): "CHOL", "HDL", "LDLCALC", "TRIG", "CHOLHDL", "LDLDIRECT" in the last 72 hours. Thyroid function studies No results for input(s): "TSH", "T4TOTAL", "T3FREE", "THYROIDAB" in the last 72 hours.  Invalid input(s): "FREET3" Anemia work up No results for input(s): "VITAMINB12", "FOLATE", "FERRITIN", "TIBC", "IRON", "RETICCTPCT" in the  last 72 hours. Urinalysis    Component Value Date/Time   COLORURINE YELLOW (A) 05/03/2023 1329   APPEARANCEUR HAZY (A) 05/03/2023 1329   LABSPEC 1.020 05/03/2023 1329   PHURINE 5.0 05/03/2023 1329   GLUCOSEU NEGATIVE 05/03/2023 1329   HGBUR NEGATIVE 05/03/2023 1329   BILIRUBINUR NEGATIVE 05/03/2023 1329   BILIRUBINUR neg 08/08/2019 0922   KETONESUR NEGATIVE 05/03/2023 1329   PROTEINUR NEGATIVE 05/03/2023 1329   UROBILINOGEN 0.2 08/08/2019 0922   NITRITE NEGATIVE 05/03/2023 1329   LEUKOCYTESUR TRACE (A) 05/03/2023 1329   Sepsis Labs Recent Labs  Lab 04/28/23 1532 04/29/23 0557 04/30/23 2313 05/02/23 0502  WBC 8.6 6.8 7.9 10.9*   Microbiology Recent Results (from the past 240 hours)  Surgical pcr screen     Status: Abnormal   Collection Time:  04/29/23  5:30 PM   Specimen: Nasal Mucosa; Nasal Swab  Result Value Ref Range Status   MRSA, PCR POSITIVE (A) NEGATIVE Final    Comment: RESULT CALLED TO, READ BACK BY AND VERIFIED WITH:  MICHELLE WATSON AT 2126 04/29/23 JG    Staphylococcus aureus POSITIVE (A) NEGATIVE Final    Comment: (NOTE) The Xpert SA Assay (FDA approved for NASAL specimens in patients 9 years of age and older), is one component of a comprehensive surveillance program. It is not intended to diagnose infection nor to guide or monitor treatment. Performed at [facility], [address] [place], [place] [address]   Aerobic/Anaerobic Culture w Gram Stain (surgical/deep wound)     Status: None (Preliminary result)   Collection Time: 05/01/23  4:38 PM   Specimen: Path Tissue  Result Value Ref Range Status   Specimen Description   Final    TISSUE Performed at [facility], [address], [place], [place] [address]    Special Requests   Final    GANGRENE LEFT FOOT Performed at [facility], [address] [place], [place] [address]    Gram Stain   Final    RARE WBC PRESENT, PREDOMINANTLY PMN FEW GRAM POSITIVE COCCI Performed at [facility], [address] [place], [place] [address]    Culture   Final    ABUNDANT STAPHYLOCOCCUS AUREUS MODERATE GRAM NEGATIVE RODS CULTURE REINCUBATED FOR BETTER GROWTH SUSCEPTIBILITIES TO FOLLOW NO ANAEROBES ISOLATED; CULTURE IN PROGRESS FOR 5 DAYS    Report Status PENDING  Incomplete   Imaging DG Toe Great Left Result Date: 04/28/2023 CLINICAL DATA:  Gangrene in great toe EXAM: LEFT GREAT TOE COMPARISON:  None Available. FINDINGS: Extensive cortical erosion in the head of the proximal phalanx left great toe, with height loss more marked laterally than medially. Focal areas of bone loss at the lateral margin base distal phalanx. There is resultant lateral angulation of the distal phalanx. Extensive vascular site calcifications are noted  in the distal foot. IMPRESSION: 1. Extensive septic arthritis and osteomyelitis of the great toe as above. Electronically Signed   By: Corlis Leak M.D.   On: 04/28/2023 19:58      Time coordinating discharge: over 30 minutes  SIGNED:  Sunnie Nielsen DO [facility]

## 2023-05-04 NOTE — TOC Transition Note (Signed)
 Transition of Care [facility]) - Discharge Note   Patient Details  Name: [name] MRN: [id number] Date of Birth: [birth year]  Transition of [facility]) CM/SW Contact:  Chapman Fitch, RN Phone Number: 05/04/2023, 12:17 PM   Clinical Narrative:      Patient will DC to: [facility]  Anticipated DC date:today  Family notified: RN to notify guardian  Transport by: [facility]  Per MD patient ready for DC to . RN,  and facility notified of DC. Discharge Summary sent to facility. RN given number for report. DC packet on chart. Ambulance transport requested for patient.  TOC signing off.        Patient Goals and CMS Choice            Discharge Placement                       Discharge Plan and Services Additional resources added to the After Visit Summary for                                       Social Drivers of Health (SDOH) Interventions SDOH Screenings   Food Insecurity: Patient Unable To Answer (04/28/2023)  Housing: Patient Unable To Answer (04/28/2023)  Transportation Needs: Patient Unable To Answer (04/28/2023)  Utilities: Patient Unable To Answer (04/28/2023)  Social Connections: Unknown (04/28/2023)  Recent Concern: Social Connections - Socially Isolated (04/09/2023)  Tobacco Use: Low Risk  (05/01/2023)     Readmission Risk Interventions    05/01/2023    4:26 PM  Readmission Risk Prevention Plan  Transportation Screening Complete  HRI or Home Care Consult Complete  Social Work Consult for Recovery Care Planning/Counseling Complete  Medication Review Oceanographer) Complete

## 2023-05-05 LAB — SURGICAL PATHOLOGY

## 2023-05-08 LAB — AEROBIC/ANAEROBIC CULTURE W GRAM STAIN (SURGICAL/DEEP WOUND)

## 2023-06-03 ENCOUNTER — Ambulatory Visit: Admitting: Physician Assistant

## 2023-06-30 ENCOUNTER — Encounter (INDEPENDENT_AMBULATORY_CARE_PROVIDER_SITE_OTHER): Payer: Self-pay

## 2023-07-02 ENCOUNTER — Encounter: Attending: Physician Assistant | Admitting: Physician Assistant

## 2023-07-02 DIAGNOSIS — E11621 Type 2 diabetes mellitus with foot ulcer: Secondary | ICD-10-CM | POA: Insufficient documentation

## 2023-07-02 DIAGNOSIS — T8131XA Disruption of external operation (surgical) wound, not elsewhere classified, initial encounter: Secondary | ICD-10-CM | POA: Insufficient documentation

## 2023-07-02 DIAGNOSIS — F0150 Vascular dementia without behavioral disturbance: Secondary | ICD-10-CM | POA: Insufficient documentation

## 2023-07-02 DIAGNOSIS — I7389 Other specified peripheral vascular diseases: Secondary | ICD-10-CM | POA: Insufficient documentation

## 2023-07-02 DIAGNOSIS — L97522 Non-pressure chronic ulcer of other part of left foot with fat layer exposed: Secondary | ICD-10-CM | POA: Diagnosis not present

## 2023-07-02 DIAGNOSIS — L89623 Pressure ulcer of left heel, stage 3: Secondary | ICD-10-CM | POA: Diagnosis not present

## 2023-07-10 ENCOUNTER — Encounter: Admitting: Physician Assistant

## 2023-07-10 DIAGNOSIS — E11621 Type 2 diabetes mellitus with foot ulcer: Secondary | ICD-10-CM | POA: Diagnosis not present

## 2023-07-20 ENCOUNTER — Ambulatory Visit (INDEPENDENT_AMBULATORY_CARE_PROVIDER_SITE_OTHER): Payer: Medicare HMO | Admitting: Vascular Surgery

## 2023-07-20 ENCOUNTER — Encounter (INDEPENDENT_AMBULATORY_CARE_PROVIDER_SITE_OTHER): Payer: Medicare HMO

## 2023-07-24 ENCOUNTER — Encounter: Attending: Physician Assistant | Admitting: Physician Assistant

## 2023-07-24 DIAGNOSIS — Y835 Amputation of limb(s) as the cause of abnormal reaction of the patient, or of later complication, without mention of misadventure at the time of the procedure: Secondary | ICD-10-CM | POA: Insufficient documentation

## 2023-07-24 DIAGNOSIS — T8781 Dehiscence of amputation stump: Secondary | ICD-10-CM | POA: Diagnosis not present

## 2023-07-24 DIAGNOSIS — F0150 Vascular dementia without behavioral disturbance: Secondary | ICD-10-CM | POA: Insufficient documentation

## 2023-07-24 DIAGNOSIS — L89623 Pressure ulcer of left heel, stage 3: Secondary | ICD-10-CM | POA: Diagnosis not present

## 2023-07-24 DIAGNOSIS — E11621 Type 2 diabetes mellitus with foot ulcer: Secondary | ICD-10-CM | POA: Insufficient documentation

## 2023-07-24 DIAGNOSIS — I7389 Other specified peripheral vascular diseases: Secondary | ICD-10-CM | POA: Diagnosis not present

## 2023-08-07 ENCOUNTER — Encounter: Admitting: Physician Assistant

## 2023-08-07 DIAGNOSIS — E11621 Type 2 diabetes mellitus with foot ulcer: Secondary | ICD-10-CM | POA: Diagnosis not present

## 2023-08-21 ENCOUNTER — Encounter: Attending: Physician Assistant | Admitting: Physician Assistant

## 2023-08-21 DIAGNOSIS — L89623 Pressure ulcer of left heel, stage 3: Secondary | ICD-10-CM | POA: Insufficient documentation

## 2023-08-21 DIAGNOSIS — L97522 Non-pressure chronic ulcer of other part of left foot with fat layer exposed: Secondary | ICD-10-CM | POA: Insufficient documentation

## 2023-08-21 DIAGNOSIS — X58XXXA Exposure to other specified factors, initial encounter: Secondary | ICD-10-CM | POA: Insufficient documentation

## 2023-08-21 DIAGNOSIS — E11621 Type 2 diabetes mellitus with foot ulcer: Secondary | ICD-10-CM | POA: Diagnosis present

## 2023-08-21 DIAGNOSIS — F0150 Vascular dementia without behavioral disturbance: Secondary | ICD-10-CM | POA: Diagnosis not present

## 2023-08-21 DIAGNOSIS — E1151 Type 2 diabetes mellitus with diabetic peripheral angiopathy without gangrene: Secondary | ICD-10-CM | POA: Insufficient documentation

## 2023-08-21 DIAGNOSIS — T8131XA Disruption of external operation (surgical) wound, not elsewhere classified, initial encounter: Secondary | ICD-10-CM | POA: Diagnosis not present

## 2023-09-04 ENCOUNTER — Encounter: Admitting: Physician Assistant

## 2023-09-04 DIAGNOSIS — E11621 Type 2 diabetes mellitus with foot ulcer: Secondary | ICD-10-CM | POA: Diagnosis not present

## 2023-09-18 ENCOUNTER — Encounter: Attending: Physician Assistant | Admitting: Physician Assistant

## 2023-09-18 DIAGNOSIS — T8131XA Disruption of external operation (surgical) wound, not elsewhere classified, initial encounter: Secondary | ICD-10-CM | POA: Diagnosis not present

## 2023-09-18 DIAGNOSIS — L89623 Pressure ulcer of left heel, stage 3: Secondary | ICD-10-CM | POA: Insufficient documentation

## 2023-09-18 DIAGNOSIS — E1151 Type 2 diabetes mellitus with diabetic peripheral angiopathy without gangrene: Secondary | ICD-10-CM | POA: Diagnosis not present

## 2023-09-18 DIAGNOSIS — F0150 Vascular dementia without behavioral disturbance: Secondary | ICD-10-CM | POA: Insufficient documentation

## 2023-09-18 DIAGNOSIS — L97522 Non-pressure chronic ulcer of other part of left foot with fat layer exposed: Secondary | ICD-10-CM | POA: Diagnosis not present

## 2023-09-18 DIAGNOSIS — E11621 Type 2 diabetes mellitus with foot ulcer: Secondary | ICD-10-CM | POA: Diagnosis present

## 2023-10-02 ENCOUNTER — Encounter: Admitting: Physician Assistant

## 2023-10-02 DIAGNOSIS — E11621 Type 2 diabetes mellitus with foot ulcer: Secondary | ICD-10-CM | POA: Diagnosis not present

## 2023-10-16 ENCOUNTER — Ambulatory Visit: Admitting: Physician Assistant

## 2023-10-20 ENCOUNTER — Encounter: Attending: Physician Assistant | Admitting: Physician Assistant

## 2023-10-20 DIAGNOSIS — L97522 Non-pressure chronic ulcer of other part of left foot with fat layer exposed: Secondary | ICD-10-CM | POA: Insufficient documentation

## 2023-10-20 DIAGNOSIS — I7389 Other specified peripheral vascular diseases: Secondary | ICD-10-CM | POA: Diagnosis not present

## 2023-10-20 DIAGNOSIS — F0150 Vascular dementia without behavioral disturbance: Secondary | ICD-10-CM | POA: Insufficient documentation

## 2023-10-20 DIAGNOSIS — L89623 Pressure ulcer of left heel, stage 3: Secondary | ICD-10-CM | POA: Insufficient documentation

## 2023-10-20 DIAGNOSIS — X58XXXA Exposure to other specified factors, initial encounter: Secondary | ICD-10-CM | POA: Insufficient documentation

## 2023-10-20 DIAGNOSIS — T8131XA Disruption of external operation (surgical) wound, not elsewhere classified, initial encounter: Secondary | ICD-10-CM | POA: Insufficient documentation

## 2023-10-20 DIAGNOSIS — E11621 Type 2 diabetes mellitus with foot ulcer: Secondary | ICD-10-CM | POA: Diagnosis present

## 2023-11-06 ENCOUNTER — Encounter
Admission: EM | Disposition: A | Discharge status: Skilled Nursing Facility | Payer: Self-pay | Source: Home / Self Care | Attending: Student

## 2023-11-06 ENCOUNTER — Emergency Department: Admitting: Certified Registered"

## 2023-11-06 ENCOUNTER — Other Ambulatory Visit: Payer: Self-pay

## 2023-11-06 ENCOUNTER — Encounter: Admitting: Physician Assistant

## 2023-11-06 ENCOUNTER — Inpatient Hospital Stay
Admission: EM | Admit: 2023-11-06 | Discharge: 2023-11-11 | DRG: 271 | Disposition: A | Discharge status: Skilled Nursing Facility | Attending: Family Medicine | Admitting: Family Medicine

## 2023-11-06 DIAGNOSIS — G8194 Hemiplegia, unspecified affecting left nondominant side: Secondary | ICD-10-CM | POA: Diagnosis not present

## 2023-11-06 DIAGNOSIS — I10 Essential (primary) hypertension: Secondary | ICD-10-CM | POA: Diagnosis present

## 2023-11-06 DIAGNOSIS — Z8673 Personal history of transient ischemic attack (TIA), and cerebral infarction without residual deficits: Secondary | ICD-10-CM | POA: Diagnosis not present

## 2023-11-06 DIAGNOSIS — T82868A Thrombosis of vascular prosthetic devices, implants and grafts, initial encounter: Secondary | ICD-10-CM | POA: Diagnosis not present

## 2023-11-06 DIAGNOSIS — Z9889 Other specified postprocedural states: Secondary | ICD-10-CM

## 2023-11-06 DIAGNOSIS — E269 Hyperaldosteronism, unspecified: Secondary | ICD-10-CM | POA: Diagnosis present

## 2023-11-06 DIAGNOSIS — L0390 Cellulitis, unspecified: Secondary | ICD-10-CM | POA: Diagnosis present

## 2023-11-06 DIAGNOSIS — I96 Gangrene, not elsewhere classified: Secondary | ICD-10-CM | POA: Diagnosis not present

## 2023-11-06 DIAGNOSIS — T82858A Stenosis of vascular prosthetic devices, implants and grafts, initial encounter: Secondary | ICD-10-CM | POA: Diagnosis present

## 2023-11-06 DIAGNOSIS — I70222 Atherosclerosis of native arteries of extremities with rest pain, left leg: Secondary | ICD-10-CM | POA: Diagnosis not present

## 2023-11-06 DIAGNOSIS — E782 Mixed hyperlipidemia: Secondary | ICD-10-CM | POA: Diagnosis present

## 2023-11-06 DIAGNOSIS — I2510 Atherosclerotic heart disease of native coronary artery without angina pectoris: Secondary | ICD-10-CM | POA: Diagnosis present

## 2023-11-06 DIAGNOSIS — Z89422 Acquired absence of other left toe(s): Secondary | ICD-10-CM

## 2023-11-06 DIAGNOSIS — E860 Dehydration: Secondary | ICD-10-CM | POA: Diagnosis present

## 2023-11-06 DIAGNOSIS — L97529 Non-pressure chronic ulcer of other part of left foot with unspecified severity: Secondary | ICD-10-CM | POA: Diagnosis present

## 2023-11-06 DIAGNOSIS — L97528 Non-pressure chronic ulcer of other part of left foot with other specified severity: Secondary | ICD-10-CM | POA: Diagnosis not present

## 2023-11-06 DIAGNOSIS — Z9862 Peripheral vascular angioplasty status: Secondary | ICD-10-CM | POA: Diagnosis not present

## 2023-11-06 DIAGNOSIS — Z515 Encounter for palliative care: Secondary | ICD-10-CM

## 2023-11-06 DIAGNOSIS — F03B0 Unspecified dementia, moderate, without behavioral disturbance, psychotic disturbance, mood disturbance, and anxiety: Secondary | ICD-10-CM | POA: Diagnosis present

## 2023-11-06 DIAGNOSIS — Z803 Family history of malignant neoplasm of breast: Secondary | ICD-10-CM

## 2023-11-06 DIAGNOSIS — Z7902 Long term (current) use of antithrombotics/antiplatelets: Secondary | ICD-10-CM | POA: Diagnosis not present

## 2023-11-06 DIAGNOSIS — Z794 Long term (current) use of insulin: Secondary | ICD-10-CM | POA: Diagnosis not present

## 2023-11-06 DIAGNOSIS — Z789 Other specified health status: Secondary | ICD-10-CM | POA: Diagnosis not present

## 2023-11-06 DIAGNOSIS — Z7982 Long term (current) use of aspirin: Secondary | ICD-10-CM

## 2023-11-06 DIAGNOSIS — Z96652 Presence of left artificial knee joint: Secondary | ICD-10-CM

## 2023-11-06 DIAGNOSIS — F0390 Unspecified dementia without behavioral disturbance: Secondary | ICD-10-CM | POA: Diagnosis not present

## 2023-11-06 DIAGNOSIS — M86172 Other acute osteomyelitis, left ankle and foot: Secondary | ICD-10-CM | POA: Diagnosis not present

## 2023-11-06 DIAGNOSIS — I69354 Hemiplegia and hemiparesis following cerebral infarction affecting left non-dominant side: Secondary | ICD-10-CM

## 2023-11-06 DIAGNOSIS — I739 Peripheral vascular disease, unspecified: Secondary | ICD-10-CM | POA: Diagnosis present

## 2023-11-06 DIAGNOSIS — Z96653 Presence of artificial knee joint, bilateral: Secondary | ICD-10-CM | POA: Diagnosis present

## 2023-11-06 DIAGNOSIS — L97429 Non-pressure chronic ulcer of left heel and midfoot with unspecified severity: Secondary | ICD-10-CM | POA: Diagnosis present

## 2023-11-06 DIAGNOSIS — E7800 Pure hypercholesterolemia, unspecified: Secondary | ICD-10-CM | POA: Diagnosis present

## 2023-11-06 DIAGNOSIS — E1152 Type 2 diabetes mellitus with diabetic peripheral angiopathy with gangrene: Secondary | ICD-10-CM | POA: Diagnosis present

## 2023-11-06 DIAGNOSIS — I700 Atherosclerosis of aorta: Secondary | ICD-10-CM

## 2023-11-06 DIAGNOSIS — L03116 Cellulitis of left lower limb: Secondary | ICD-10-CM | POA: Diagnosis not present

## 2023-11-06 DIAGNOSIS — M24562 Contracture, left knee: Secondary | ICD-10-CM | POA: Diagnosis not present

## 2023-11-06 DIAGNOSIS — I743 Embolism and thrombosis of arteries of the lower extremities: Secondary | ICD-10-CM | POA: Diagnosis not present

## 2023-11-06 DIAGNOSIS — G8191 Hemiplegia, unspecified affecting right dominant side: Secondary | ICD-10-CM

## 2023-11-06 DIAGNOSIS — I70262 Atherosclerosis of native arteries of extremities with gangrene, left leg: Principal | ICD-10-CM | POA: Diagnosis present

## 2023-11-06 DIAGNOSIS — Z79899 Other long term (current) drug therapy: Secondary | ICD-10-CM

## 2023-11-06 DIAGNOSIS — Z7409 Other reduced mobility: Secondary | ICD-10-CM | POA: Diagnosis not present

## 2023-11-06 DIAGNOSIS — I6523 Occlusion and stenosis of bilateral carotid arteries: Secondary | ICD-10-CM | POA: Diagnosis not present

## 2023-11-06 DIAGNOSIS — S91302A Unspecified open wound, left foot, initial encounter: Secondary | ICD-10-CM | POA: Diagnosis not present

## 2023-11-06 DIAGNOSIS — I7025 Atherosclerosis of native arteries of other extremities with ulceration: Secondary | ICD-10-CM | POA: Diagnosis not present

## 2023-11-06 DIAGNOSIS — D62 Acute posthemorrhagic anemia: Secondary | ICD-10-CM | POA: Diagnosis not present

## 2023-11-06 DIAGNOSIS — L97509 Non-pressure chronic ulcer of other part of unspecified foot with unspecified severity: Secondary | ICD-10-CM | POA: Diagnosis present

## 2023-11-06 DIAGNOSIS — Z7189 Other specified counseling: Secondary | ICD-10-CM | POA: Diagnosis not present

## 2023-11-06 DIAGNOSIS — I998 Other disorder of circulatory system: Principal | ICD-10-CM | POA: Diagnosis present

## 2023-11-06 DIAGNOSIS — E11621 Type 2 diabetes mellitus with foot ulcer: Secondary | ICD-10-CM | POA: Diagnosis present

## 2023-11-06 DIAGNOSIS — I779 Disorder of arteries and arterioles, unspecified: Secondary | ICD-10-CM | POA: Diagnosis present

## 2023-11-06 DIAGNOSIS — Z66 Do not resuscitate: Secondary | ICD-10-CM | POA: Diagnosis present

## 2023-11-06 DIAGNOSIS — E1142 Type 2 diabetes mellitus with diabetic polyneuropathy: Secondary | ICD-10-CM | POA: Diagnosis present

## 2023-11-06 HISTORY — PX: LOWER EXTREMITY ANGIOGRAPHY: CATH118251

## 2023-11-06 LAB — COMPREHENSIVE METABOLIC PANEL WITH GFR
ALT: 11 U/L (ref 0–44)
AST: 30 U/L (ref 15–41)
Albumin: 3 g/dL — ABNORMAL LOW (ref 3.5–5.0)
Alkaline Phosphatase: 67 U/L (ref 38–126)
Anion gap: 13 (ref 5–15)
BUN: 21 mg/dL (ref 8–23)
CO2: 24 mmol/L (ref 22–32)
Calcium: 8.9 mg/dL (ref 8.9–10.3)
Chloride: 104 mmol/L (ref 98–111)
Creatinine, Ser: 0.54 mg/dL (ref 0.44–1.00)
GFR, Estimated: >60 mL/min (ref >60)
Glucose, Bld: 148 mg/dL — ABNORMAL HIGH (ref 70–99)
Potassium: 4.3 mmol/L (ref 3.5–5.1)
Sodium: 141 mmol/L (ref 135–145)
Total Bilirubin: 0.8 mg/dL (ref 0.0–1.2)
Total Protein: 6.7 g/dL (ref 6.5–8.1)

## 2023-11-06 LAB — CBC
HCT: 31.8 % — ABNORMAL LOW (ref 36.0–46.0)
Hemoglobin: 9.6 g/dL — ABNORMAL LOW (ref 12.0–15.0)
MCH: 30.2 pg (ref 26.0–34.0)
MCHC: 30.2 g/dL (ref 30.0–36.0)
MCV: 100 fL (ref 80.0–100.0)
Platelets: 134 K/uL — ABNORMAL LOW (ref 150–400)
RBC: 3.18 MIL/uL — ABNORMAL LOW (ref 3.87–5.11)
RDW: 13.2 % (ref 11.5–15.5)
WBC: 9.4 K/uL (ref 4.0–10.5)
nRBC: 0 % (ref 0.0–0.2)

## 2023-11-06 LAB — GLUCOSE, CAPILLARY: Glucose-Capillary: 141 mg/dL — ABNORMAL HIGH (ref 70–99)

## 2023-11-06 LAB — APTT
aPTT: 36 s (ref 24–36)
aPTT: 47 s — ABNORMAL HIGH (ref 24–36)

## 2023-11-06 LAB — PROTIME-INR
INR: 1.1 (ref 0.8–1.2)
Prothrombin Time: 14.8 s (ref 11.4–15.2)

## 2023-11-06 SURGERY — LOWER EXTREMITY ANGIOGRAPHY
Anesthesia: Moderate Sedation | Laterality: Left

## 2023-11-06 MED ORDER — SODIUM CHLORIDE 0.9% FLUSH
3.0000 mL | Freq: Two times a day (BID) | INTRAVENOUS | Status: DC
  Administered 2023-11-06 – 2023-11-11 (×9): 3 mL via INTRAVENOUS

## 2023-11-06 MED ORDER — LABETALOL HCL 5 MG/ML IV SOLN
INTRAVENOUS | Status: AC
  Filled 2023-11-06: qty 4

## 2023-11-06 MED ORDER — HEPARIN (PORCINE) 25000 UT/250ML-% IV SOLN: 1100.0000 [IU]/h | INTRAVENOUS | Status: DC

## 2023-11-06 MED ORDER — HEPARIN (PORCINE) 25000 UT/250ML-% IV SOLN
1300.0000 [IU]/h | INTRAVENOUS | Status: DC
  Administered 2023-11-07: 500 [IU]/h via INTRAVENOUS
  Administered 2023-11-08 (×2): 800 [IU]/h via INTRAVENOUS
  Filled 2023-11-06 (×3): qty 250

## 2023-11-06 MED ORDER — FAMOTIDINE 20 MG PO TABS: 40.0000 mg | ORAL_TABLET | Freq: Once | ORAL | Status: DC | PRN

## 2023-11-06 MED ORDER — MIDAZOLAM HCL 2 MG/2ML IJ SOLN
INTRAMUSCULAR | Status: DC | PRN
  Administered 2023-11-06: .5 mg via INTRAVENOUS

## 2023-11-06 MED ORDER — FENTANYL CITRATE (PF) 100 MCG/2ML IJ SOLN
INTRAMUSCULAR | Status: DC | PRN
  Administered 2023-11-06: 12.5 ug via INTRAVENOUS

## 2023-11-06 MED ORDER — MIDAZOLAM HCL 5 MG/5ML IJ SOLN
INTRAMUSCULAR | Status: AC
  Filled 2023-11-06: qty 5

## 2023-11-06 MED ORDER — GLUCERNA SHAKE PO LIQD
237.0000 mL | Freq: Three times a day (TID) | ORAL | Status: DC
Start: 2023-11-07 — End: 2023-11-12
  Administered 2023-11-07 – 2023-11-11 (×14): 237 mL via ORAL

## 2023-11-06 MED ORDER — FENTANYL CITRATE (PF) 100 MCG/2ML IJ SOLN
INTRAMUSCULAR | Status: AC
  Filled 2023-11-06: qty 2

## 2023-11-06 MED ORDER — HEPARIN (PORCINE) IN NACL 2000-0.9 UNIT/L-% IV SOLN
INTRAVENOUS | Status: DC | PRN
  Administered 2023-11-06: 1000 mL

## 2023-11-06 MED ORDER — SODIUM CHLORIDE 0.9 % IV SOLN: INTRAVENOUS | Status: DC

## 2023-11-06 MED ORDER — METHYLPREDNISOLONE SODIUM SUCC 125 MG IJ SOLR: 125.0000 mg | Freq: Once | INTRAMUSCULAR | Status: DC | PRN

## 2023-11-06 MED ORDER — MIDAZOLAM HCL 2 MG/ML PO SYRP: 8.0000 mg | ORAL_SOLUTION | Freq: Once | ORAL | Status: DC | PRN

## 2023-11-06 MED ORDER — ACETAMINOPHEN 650 MG RE SUPP: 650.0000 mg | Freq: Four times a day (QID) | RECTAL | Status: DC | PRN

## 2023-11-06 MED ORDER — HEPARIN BOLUS VIA INFUSION
4100.0000 [IU] | Freq: Once | INTRAVENOUS | Status: DC
  Filled 2023-11-06: qty 4100

## 2023-11-06 MED ORDER — CEFAZOLIN SODIUM-DEXTROSE 2-4 GM/100ML-% IV SOLN
2.0000 g | INTRAVENOUS | Status: AC
  Administered 2023-11-06: 2 g via INTRAVENOUS

## 2023-11-06 MED ORDER — ACETAMINOPHEN 325 MG PO TABS
650.0000 mg | ORAL_TABLET | Freq: Four times a day (QID) | ORAL | Status: DC | PRN
  Administered 2023-11-08 – 2023-11-11 (×4): 650 mg via ORAL
  Filled 2023-11-06 (×3): qty 2

## 2023-11-06 MED ORDER — HEPARIN SODIUM (PORCINE) 1000 UNIT/ML IJ SOLN
INTRAMUSCULAR | Status: AC
  Filled 2023-11-06: qty 10

## 2023-11-06 MED ORDER — CEFAZOLIN SODIUM-DEXTROSE 2-4 GM/100ML-% IV SOLN
INTRAVENOUS | Status: AC
  Filled 2023-11-06: qty 100

## 2023-11-06 MED ORDER — CLOPIDOGREL BISULFATE 75 MG PO TABS
75.0000 mg | ORAL_TABLET | Freq: Every day | ORAL | Status: DC
  Administered 2023-11-07 – 2023-11-11 (×5): 75 mg via ORAL
  Filled 2023-11-06 (×5): qty 1

## 2023-11-06 MED ORDER — HEPARIN SODIUM (PORCINE) 1000 UNIT/ML IJ SOLN
INTRAMUSCULAR | Status: DC | PRN
  Administered 2023-11-06: 6000 [IU] via INTRAVENOUS

## 2023-11-06 MED ORDER — LISINOPRIL 10 MG PO TABS
20.0000 mg | ORAL_TABLET | Freq: Every day | ORAL | Status: DC
  Administered 2023-11-07 – 2023-11-11 (×5): 20 mg via ORAL
  Filled 2023-11-06 (×5): qty 2

## 2023-11-06 MED ORDER — POLYETHYLENE GLYCOL 3350 17 G PO PACK: 17.0000 g | PACK | Freq: Every day | ORAL | Status: DC | PRN

## 2023-11-06 MED ORDER — ATORVASTATIN CALCIUM 80 MG PO TABS
80.0000 mg | ORAL_TABLET | Freq: Every day | ORAL | Status: DC
  Administered 2023-11-07 – 2023-11-10 (×4): 80 mg via ORAL
  Filled 2023-11-06 (×5): qty 1

## 2023-11-06 MED ORDER — LIDOCAINE HCL (PF) 1 % IJ SOLN
INTRAMUSCULAR | Status: DC | PRN
  Administered 2023-11-06: 10 mL

## 2023-11-06 MED ORDER — MIDAZOLAM HCL 2 MG/2ML IJ SOLN
INTRAMUSCULAR | Status: DC | PRN
  Administered 2023-11-06: 1 mg via INTRAVENOUS

## 2023-11-06 MED ORDER — IODIXANOL 320 MG/ML IV SOLN
INTRAVENOUS | Status: DC | PRN
  Administered 2023-11-06: 90 mL

## 2023-11-06 MED ORDER — FENTANYL CITRATE (PF) 100 MCG/2ML IJ SOLN
INTRAMUSCULAR | Status: DC | PRN
  Administered 2023-11-06 (×2): 25 ug via INTRAVENOUS

## 2023-11-06 MED ORDER — LABETALOL HCL 5 MG/ML IV SOLN
10.0000 mg | Freq: Once | INTRAVENOUS | Status: AC
  Administered 2023-11-06: 10 mg via INTRAVENOUS

## 2023-11-06 MED ORDER — HEPARIN (PORCINE) 25000 UT/250ML-% IV SOLN
1100.0000 [IU]/h | INTRAVENOUS | Status: DC
Start: 2023-11-06 — End: 2023-11-06
  Administered 2023-11-06: 1100 [IU]/h via INTRAVENOUS
  Filled 2023-11-06: qty 250

## 2023-11-06 MED ORDER — DIPHENHYDRAMINE HCL 50 MG/ML IJ SOLN: 50.0000 mg | Freq: Once | INTRAMUSCULAR | Status: DC | PRN

## 2023-11-06 MED ORDER — VANCOMYCIN HCL IN DEXTROSE 1-5 GM/200ML-% IV SOLN: 1000.0000 mg | Freq: Once | INTRAVENOUS | Status: DC

## 2023-11-06 SURGICAL SUPPLY — 34 items
BALLOON JADE .014 4.0 X 40 0 (BALLOONS) IMPLANT
BALLOON LUTONIX 018 4X60X130 (BALLOONS) IMPLANT
BALLOON LUTONIX DCB 4X80X130 (BALLOONS) IMPLANT
BALLOON LUTONIX DCB 5X40X130 (BALLOONS) IMPLANT
BALLOON ULTRVRSE 3X100X130C (BALLOONS) IMPLANT
BALLOON ULTRVRSE 3X100X150 (BALLOONS) IMPLANT
CANISTER PENUMBRA ENGINE (MISCELLANEOUS) IMPLANT
CATH ANGIO 5F PIGTAIL 65CM (CATHETERS) IMPLANT
CATH BEACON 5 .038 100 VERT TP (CATHETERS) IMPLANT
CATH BEACON TIP VERT 5FR 125 (CATHETERS) IMPLANT
CATH CXI SUPP ANG 4FR 135 (CATHETERS) IMPLANT
CATH LIGHTNING BOLT 6 XTX (CATHETERS) IMPLANT
CATH SEEKER .018X150 (CATHETERS) IMPLANT
COVER PROBE ULTRASOUND 5X96 (MISCELLANEOUS) IMPLANT
DEVICE PRESTO INFLATION (MISCELLANEOUS) IMPLANT
DEVICE VASC CLSR CELT ART 6 (Vascular Products) IMPLANT
GLIDEWIRE ADV .035X260CM (WIRE) IMPLANT
GOWN STRL REUS W/ TWL LRG LVL3 (GOWN DISPOSABLE) ×1 IMPLANT
NDL ENTRY 21GA 7CM ECHOTIP (NEEDLE) IMPLANT
NEEDLE ENTRY 21GA 7CM ECHOTIP (NEEDLE) ×1 IMPLANT
PACK ANGIOGRAPHY (CUSTOM PROCEDURE TRAY) ×1 IMPLANT
SET INTRO CAPELLA COAXIAL (SET/KITS/TRAYS/PACK) IMPLANT
SHEATH BRITE TIP 5FRX11 (SHEATH) IMPLANT
SHEATH BRITE TIP 6FRX11 (SHEATH) IMPLANT
SHEATH RAABE 6FR (SHEATH) IMPLANT
STENT LIFESTENT 5F 5X170X135 (Permanent Stent) IMPLANT
STENT OTW ESPRIT BTK 3.75X38 (Permanent Stent) IMPLANT
SYR MEDRAD MARK 7 150ML (SYRINGE) IMPLANT
TUBING CONTRAST HIGH PRESS 72 (TUBING) IMPLANT
WIRE COMMAND ST STR 014 300 (WIRE) IMPLANT
WIRE G V18X300CM (WIRE) IMPLANT
WIRE J 3MM .035X145CM (WIRE) IMPLANT
WIRE ROSEN-J .035X260CM (WIRE) IMPLANT
WIRE SUPRACORE 300CM (WIRE) IMPLANT

## 2023-11-06 NOTE — Interval H&P Note (Signed)
 History and Physical Interval Note:  11/06/2023 12:45 PM  [name]  has presented today for surgery, with the diagnosis of PAD.  The various methods of treatment have been discussed with the patient and family. After consideration of risks, benefits and other options for treatment, the patient has consented to  Procedure(s): Lower Extremity Angiography (Left) as a surgical intervention.  The patient's history has been reviewed, patient examined, no change in status, stable for surgery.  I have reviewed the patient's chart and labs.  Questions were answered to the patient's satisfaction.     Cordella Shawl

## 2023-11-06 NOTE — Consult Note (Signed)
 Hospital Consult    Reason for Consult:  Left Lower Extremity Ischemia Requesting Physician:  Dr Franky Moores MD  MRN #:  [id number]  History of Present Illness: This is a 89 y.o. female with a past medical history of dementia, diabetes, CVA with left hemiparesis, hypertension, hyperlipidemia, presents to the emergency department with concerns for left foot ulceration and decreased circulation. Patient is coming from the wound clinic where she receives wound care for a chronic ulceration of the left heel/foot. Patient has had amputations of most of her toes on the left side besides the second toe which has become dark over the last few weeks. Patient was sent to the emergency room by the wound clinic due to worsening of her foot and toe. Toe now with gangrene and open ulcer.   Patient presents with her guardian from DSS today who gives consent for the procedure. Patient lives at [facility] due to her medical history and need for continued care. Patient was last seen by Dr Marea on 04/30/23 for left lower extremity ischemia. Vascular Surgery consulted to evaluate.   Past Medical History:  Diagnosis Date   Carpal tunnel syndrome on both sides    Diabetes (HCC)    Diabetic polyneuropathy (HCC)    Diverticulitis    Hyperlipidemia    Hypertension    Osteoarthritis    Vaginal prolapse     Past Surgical History:  Procedure Laterality Date   AMPUTATION Left 05/01/2023   Procedure: AMPUTATION, FOOT, RAY;  Surgeon: Lennie Barter, DPM;  Location: [facility];  Service: Orthopedics/Podiatry;  Laterality: Left;  LEFT PARTIAL FIRST RAY   AMPUTATION TOE Left 08/13/2022   Procedure: EXCISION OF 5TH RAY;  Surgeon: Ashley Soulier, DPM;  Location: [facility];  Service: Orthopedics/Podiatry;  Laterality: Left;   CESAREAN SECTION     IRRIGATION AND DEBRIDEMENT FOOT Left 01/09/2022   Procedure: IRRIGATION AND DEBRIDEMENT FOOT WITH BONE BIOPSY;  Surgeon: Ashley Soulier, DPM;  Location: [facility];  Service:  Podiatry;  Laterality: Left;   LOWER EXTREMITY ANGIOGRAPHY Left 06/11/2021   Procedure: Lower Extremity Angiography;  Surgeon: Jama Cordella MATSU, MD;  Location: [facility];  Service: Cardiovascular;  Laterality: Left;   LOWER EXTREMITY ANGIOGRAPHY Left 08/11/2022   Procedure: Lower Extremity Angiography;  Surgeon: Jama Cordella MATSU, MD;  Location: [facility];  Service: Cardiovascular;  Laterality: Left;   LOWER EXTREMITY ANGIOGRAPHY Left 04/30/2023   Procedure: Lower Extremity Angiography;  Surgeon: Marea Selinda RAMAN, MD;  Location: [facility];  Service: Cardiovascular;  Laterality: Left;   pessary     REPLACEMENT TOTAL KNEE BILATERAL     VENTRAL HERNIA REPAIR     VENTRAL HERNIA REPAIR N/A 01/13/2016   Procedure: HERNIA REPAIR VENTRAL ADULT WITH SMALL BOWEL RESECTION, LYSIS OF ADHESIONS;  Surgeon: Dorothyann LITTIE Husk, MD;  Location: [facility];  Service: General;  Laterality: N/A;    Allergies  Allergen Reactions   Metformin  And Related Nausea And Vomiting    Prior to Admission medications   Medication Sig Start Date End Date Taking? Authorizing Provider  acetaminophen  (TYLENOL ) 325 MG tablet Take 2 tablets (650 mg total) by mouth every 6 (six) hours as needed for mild pain, moderate pain, fever or headache. Patient taking differently: Take 650 mg by mouth every 6 (six) hours as needed for mild pain (pain score 1-3), moderate pain (pain score 4-6), fever or headache. PRN 01/25/16   Loflin, Catherine L, MD  Amino Acids -Protein Hydrolys (FEEDING SUPPLEMENT, PRO-STAT SUGAR FREE  64,) LIQD Take 30 mLs by mouth in the morning and at bedtime.    [provider]  ascorbic acid  (VITAMIN C ) 500 MG tablet Take 500 mg by mouth 2 (two) times daily.    [provider]  aspirin  EC 81 MG tablet Take 81 mg by mouth daily. Swallow whole.    [provider]  atorvastatin  (LIPITOR ) 80 MG tablet Take 1 tablet (80 mg total) by mouth daily. Patient taking differently:  Take 80 mg by mouth at bedtime. 09/26/19   Regalado, Belkys A, MD  bisacodyl  (DULCOLAX) 10 MG suppository Place 1 suppository (10 mg total) rectally daily as needed for severe constipation. 05/04/23   Alexander, Natalie, DO  clopidogrel  (PLAVIX ) 75 MG tablet Take 1 tablet (75 mg total) by mouth daily. 09/26/19   Regalado, Belkys A, MD  cyanocobalamin  (VITAMIN B12) 1000 MCG tablet Take 1,000 mcg by mouth daily.    [provider]  feeding supplement, GLUCERNA SHAKE, (GLUCERNA SHAKE) LIQD Take 237 mLs by mouth 3 (three) times daily between meals. 05/04/23   Alexander, Natalie, DO  ferrous sulfate  325 (65 FE) MG tablet Take 325 mg by mouth every other day.    [provider]  Glucagon , rDNA, (GLUCAGON  EMERGENCY) 1 MG KIT Inject 1 mg into the muscle once as needed (low blood sugar). 08/26/22   [provider]  hydrALAZINE  (APRESOLINE ) 25 MG tablet Take 25 mg by mouth daily as needed (SBP> 160). PRN 03/04/23   [provider]  insulin  aspart (NOVOLOG ) 100 UNIT/ML injection CBG 70 - 120: 0 units CBG 121 - 150: 2 units CBG 151 - 200: 3 units CBG 201 - 250: 5 units CBG 251 - 300: 8 units CBG 301 - 350: 11 units CBG 351 - 400: 15 units CBG > 400: call MD 05/04/23   Alexander, Natalie, DO  lisinopril  (ZESTRIL ) 20 MG tablet Take 20 mg by mouth daily. 08/01/22   [provider]  magnesium  oxide (MAG-OX) 400 (240 Mg) MG tablet Take 400 mg by mouth daily.    [provider]  Multiple Vitamins-Minerals (MULTIVITAMIN WITH MINERALS) tablet Take 1 tablet by mouth daily.    [provider]  naloxone [facility]) nasal spray 4 mg/0.1 mL Place 1 spray into the nose 3 (three) times daily as needed (opioid overodse).    [provider]  oxyCODONE  (OXY IR/ROXICODONE ) 5 MG immediate release tablet Take 1 tablet (5 mg total) by mouth 3 (three) times daily as needed for severe pain (pain score 7-10). 05/04/23   Alexander, Natalie, DO  polyethylene glycol (MIRALAX  /  GLYCOLAX ) 17 g packet Take 17 g by mouth daily.    [provider]  Vitamin D , Ergocalciferol , (DRISDOL ) 1.25 MG (50000 UNIT) CAPS capsule Take 50,000 Units by mouth every 7 (seven) days. thursday    [provider]    Social History   Socioeconomic History   Marital status: Widowed    Spouse name: Not on file   Number of children: Not on file   Years of education: Not on file   Highest education level: Not on file  Occupational History   Not on file  Tobacco Use   Smoking status: Never   Smokeless tobacco: Never  Vaping Use   Vaping status: Never Used  Substance and Sexual Activity   Alcohol  use: No   Drug use: No   Sexual activity: Not Currently  Other Topics Concern   Not on file  Social History Narrative   Lives  at home by herself. Ambulates with a cane.   Social Drivers of Corporate investment banker Strain: Not on file  Food Insecurity: Patient Unable To Answer (04/28/2023)   Hunger Vital Sign    Worried About Running Out of Food in the Last Year: Patient unable to answer    Ran Out of Food in the Last Year: Patient unable to answer  Transportation Needs: Patient Unable To Answer (04/28/2023)   PRAPARE - Transportation    Lack of Transportation (Medical): Patient unable to answer    Lack of Transportation (Non-Medical): Patient unable to answer  Physical Activity: Not on file  Stress: Not on file  Social Connections: Unknown (04/28/2023)   Social Connection and Isolation Panel    Frequency of Communication with Friends and Family: Patient unable to answer    Frequency of Social Gatherings with Friends and Family: Patient unable to answer    Attends Religious Services: Patient unable to answer    Active Member of Clubs or Organizations: Patient unable to answer    Attends Banker Meetings: Patient unable to answer    Marital Status: Widowed  Recent Concern: Social Connections - Socially Isolated (04/09/2023)   Social Connection and  Isolation Panel    Frequency of Communication with Friends and Family: Twice a week    Frequency of Social Gatherings with Friends and Family: Never    Attends Religious Services: Never    Database administrator or Organizations: No    Attends Banker Meetings: Never    Marital Status: Widowed  Intimate Partner Violence: Patient Unable To Answer (04/28/2023)   Humiliation, Afraid, Rape, and Kick questionnaire    Fear of Current or Ex-Partner: Patient unable to answer    Emotionally Abused: Patient unable to answer    Physically Abused: Patient unable to answer    Sexually Abused: Patient unable to answer     Family History  Problem Relation Age of Onset   Breast cancer Mother 64   Breast cancer Maternal Aunt     ROS: Otherwise negative unless mentioned in HPI  Physical Examination  Vitals:   11/06/23 1031  BP: (!) 184/78  Pulse: 82  Resp: 17  Temp: 98.1 F (36.7 C)  SpO2: 100%   Body mass index is 25.96 kg/m.  General:  WDWN in NAD Gait: Not observed HENT: WNL, normocephalic Pulmonary: normal non-labored breathing, without Rales, rhonchi,  wheezing Cardiac: regular, without  Murmurs, rubs or gallops; without carotid bruits Abdomen: Positive bowel sounds throughout, soft, NT/ND, no masses Skin: without rashes Vascular Exam/Pulses: Bilateral lower extremities with positive doppler pulses in both DP and PT. Doppler stronger on her right side vs Left side.  Extremities: with ischemic changes, with Gangrene , without cellulitis; with open wounds;  Musculoskeletal: no muscle wasting or atrophy  Neurologic: A&O X 1, Hx of severe dementia, HX of CVA with contracture to left side with hemiparesis. Speech is fluent/normal. Psychiatric:  The pt has Abnormal- Patient with severe dementia and is under the care of [facility] of [place] .  Happy affect. Lymph:  Unremarkable  CBC    Component Value Date/Time   WBC 10.9 (H) 05/02/2023 0502   RBC 2.78 (L) 05/02/2023  0502   HGB 8.6 (L) 05/02/2023 0502   HGB 8.6 (L) 09/25/2013 0410   HCT 26.6 (L) 05/02/2023 0502   HCT 27.0 (L) 09/24/2013 0409   PLT 263 05/02/2023 0502   PLT 192 09/24/2013 0409   MCV 95.7 05/02/2023 0502  MCV 96 09/24/2013 0409   MCH 30.9 05/02/2023 0502   MCHC 32.3 05/02/2023 0502   RDW 13.8 05/02/2023 0502   RDW 13.8 09/24/2013 0409   LYMPHSABS 2.3 04/28/2023 1532   LYMPHSABS 2.5 09/24/2013 0409   MONOABS 0.5 04/28/2023 1532   MONOABS 0.7 09/24/2013 0409   EOSABS 0.5 04/28/2023 1532   EOSABS 0.4 09/24/2013 0409   BASOSABS 0.0 04/28/2023 1532   BASOSABS 0.0 09/24/2013 0409    BMET    Component Value Date/Time   NA 137 05/02/2023 0502   NA 143 09/24/2013 0409   K 4.2 05/02/2023 0502   K 3.6 09/24/2013 0409   CL 107 05/02/2023 0502   CL 108 (H) 09/24/2013 0409   CO2 22 05/02/2023 0502   CO2 27 09/24/2013 0409   GLUCOSE 134 (H) 05/02/2023 0502   GLUCOSE 222 (H) 09/24/2013 0409   BUN 18 05/02/2023 0502   BUN 7 09/24/2013 0409   CREATININE 0.54 05/03/2023 0558   CREATININE 0.77 09/24/2013 0409   CALCIUM  8.9 05/02/2023 0502   CALCIUM  7.7 (L) 09/24/2013 0409   GFRNONAA >60 05/03/2023 0558   GFRNONAA >60 09/24/2013 0409   GFRAA >60 09/26/2019 1952   GFRAA >60 09/24/2013 0409    COAGS: Lab Results  Component Value Date   INR 1.3 (H) 04/29/2023   INR 1.2 08/26/2022   INR 1.2 08/08/2022     Non-Invasive Vascular Imaging:   Date of Surgery: 04/30/2023   Surgeon(s):DEW,JASON     Assistants:none   Pre-operative Diagnosis: PAD with gangrene LLE   Post-operative diagnosis:  Same   Procedure(s) Performed:             1.  Ultrasound guidance for vascular access right femoral artery             2.  Catheter placement into left common femoral artery from right femoral approach             3.  Aortogram and selective left lower extremity angiogram             4.  Percutaneous transluminal angioplasty of left anterior tibial artery with 2.5 mm diameter angioplasty  balloon in the midsegment and 3 mm diameter angioplasty balloon in the proximal segment in the tibioperoneal trunk             5.  Percutaneous transluminal angioplasty of left SFA and popliteal artery with 5 mm diameter by 30 cm length Lutonix drug-coated angioplasty balloon             6.  StarClose closure device right femoral artery  Statin:  Yes.   Beta Blocker:  No. Aspirin :  Yes.   ACEI:  Yes.   ARB:  No. CCB use:  No Other antiplatelets/anticoagulants:  Yes.   Plavix  75 mg daily   ASSESSMENT/PLAN: This is a 89 y.o. female who presents from wound clinic with wound/ulcer to her left foot with gangrene to second toe. Wounds are not healing and wound clinic questions via blood flow to her foot. Patient sent to the emergency room to evaluate.   Patient has her Guardian from [facility] of [place]  at her side today. She consents to the patient having a procedure today for the complications to the patients left foot with non healing wounds. The patient will undergo a left lower extremity angiogram with possible intervention.   I discussed in detail with the patient and DSS Guardian the procedure, benefits, risks and complications. DSS has agreed to consent for  the patient to have the procedure today. I answered all their questions today. Patient will be placed on a heparin  infusion. DSS Guardian endorses the patient had 2 crackers and some Ginger Ale at 9:00 am this morning.    -I discussed the case in detail with Dr Cordella Shawl MD and he agrees with the plan.   [name] [facility] 11/06/2023 10:51 AM

## 2023-11-06 NOTE — Progress Notes (Addendum)
 Patient has guardian through DSS. [name] is the contact for consents needed. [phone]   All consent, treatments to be approved by guardian

## 2023-11-06 NOTE — H&P (View-Only) (Signed)
 Hospital Consult    Reason for Consult:  Left Lower Extremity Ischemia Requesting Physician:  Dr Franky Moores MD  MRN #:  [id number]  History of Present Illness: This is a 89 y.o. female with a past medical history of dementia, diabetes, CVA with left hemiparesis, hypertension, hyperlipidemia, presents to the emergency department with concerns for left foot ulceration and decreased circulation. Patient is coming from the wound clinic where she receives wound care for a chronic ulceration of the left heel/foot. Patient has had amputations of most of her toes on the left side besides the second toe which has become dark over the last few weeks. Patient was sent to the emergency room by the wound clinic due to worsening of her foot and toe. Toe now with gangrene and open ulcer.   Patient presents with her guardian from DSS today who gives consent for the procedure. Patient lives at [facility] due to her medical history and need for continued care. Patient was last seen by Dr Marea on 04/30/23 for left lower extremity ischemia. Vascular Surgery consulted to evaluate.   Past Medical History:  Diagnosis Date   Carpal tunnel syndrome on both sides    Diabetes (HCC)    Diabetic polyneuropathy (HCC)    Diverticulitis    Hyperlipidemia    Hypertension    Osteoarthritis    Vaginal prolapse     Past Surgical History:  Procedure Laterality Date   AMPUTATION Left 05/01/2023   Procedure: AMPUTATION, FOOT, RAY;  Surgeon: Lennie Barter, DPM;  Location: [facility];  Service: Orthopedics/Podiatry;  Laterality: Left;  LEFT PARTIAL FIRST RAY   AMPUTATION TOE Left 08/13/2022   Procedure: EXCISION OF 5TH RAY;  Surgeon: Ashley Soulier, DPM;  Location: [facility];  Service: Orthopedics/Podiatry;  Laterality: Left;   CESAREAN SECTION     IRRIGATION AND DEBRIDEMENT FOOT Left 01/09/2022   Procedure: IRRIGATION AND DEBRIDEMENT FOOT WITH BONE BIOPSY;  Surgeon: Ashley Soulier, DPM;  Location: [facility];  Service:  Podiatry;  Laterality: Left;   LOWER EXTREMITY ANGIOGRAPHY Left 06/11/2021   Procedure: Lower Extremity Angiography;  Surgeon: Jama Cordella MATSU, MD;  Location: [facility];  Service: Cardiovascular;  Laterality: Left;   LOWER EXTREMITY ANGIOGRAPHY Left 08/11/2022   Procedure: Lower Extremity Angiography;  Surgeon: Jama Cordella MATSU, MD;  Location: [facility];  Service: Cardiovascular;  Laterality: Left;   LOWER EXTREMITY ANGIOGRAPHY Left 04/30/2023   Procedure: Lower Extremity Angiography;  Surgeon: Marea Selinda RAMAN, MD;  Location: [facility];  Service: Cardiovascular;  Laterality: Left;   pessary     REPLACEMENT TOTAL KNEE BILATERAL     VENTRAL HERNIA REPAIR     VENTRAL HERNIA REPAIR N/A 01/13/2016   Procedure: HERNIA REPAIR VENTRAL ADULT WITH SMALL BOWEL RESECTION, LYSIS OF ADHESIONS;  Surgeon: Dorothyann LITTIE Husk, MD;  Location: [facility];  Service: General;  Laterality: N/A;    Allergies  Allergen Reactions   Metformin  And Related Nausea And Vomiting    Prior to Admission medications   Medication Sig Start Date End Date Taking? Authorizing Provider  acetaminophen  (TYLENOL ) 325 MG tablet Take 2 tablets (650 mg total) by mouth every 6 (six) hours as needed for mild pain, moderate pain, fever or headache. Patient taking differently: Take 650 mg by mouth every 6 (six) hours as needed for mild pain (pain score 1-3), moderate pain (pain score 4-6), fever or headache. PRN 01/25/16   Loflin, Catherine L, MD  Amino Acids -Protein Hydrolys (FEEDING SUPPLEMENT, PRO-STAT SUGAR FREE  64,) LIQD Take 30 mLs by mouth in the morning and at bedtime.    [provider]  ascorbic acid  (VITAMIN C ) 500 MG tablet Take 500 mg by mouth 2 (two) times daily.    [provider]  aspirin  EC 81 MG tablet Take 81 mg by mouth daily. Swallow whole.    [provider]  atorvastatin  (LIPITOR ) 80 MG tablet Take 1 tablet (80 mg total) by mouth daily. Patient taking differently:  Take 80 mg by mouth at bedtime. 09/26/19   Regalado, Belkys A, MD  bisacodyl  (DULCOLAX) 10 MG suppository Place 1 suppository (10 mg total) rectally daily as needed for severe constipation. 05/04/23   Alexander, Natalie, DO  clopidogrel  (PLAVIX ) 75 MG tablet Take 1 tablet (75 mg total) by mouth daily. 09/26/19   Regalado, Belkys A, MD  cyanocobalamin  (VITAMIN B12) 1000 MCG tablet Take 1,000 mcg by mouth daily.    [provider]  feeding supplement, GLUCERNA SHAKE, (GLUCERNA SHAKE) LIQD Take 237 mLs by mouth 3 (three) times daily between meals. 05/04/23   Alexander, Natalie, DO  ferrous sulfate  325 (65 FE) MG tablet Take 325 mg by mouth every other day.    [provider]  Glucagon , rDNA, (GLUCAGON  EMERGENCY) 1 MG KIT Inject 1 mg into the muscle once as needed (low blood sugar). 08/26/22   [provider]  hydrALAZINE  (APRESOLINE ) 25 MG tablet Take 25 mg by mouth daily as needed (SBP> 160). PRN 03/04/23   [provider]  insulin  aspart (NOVOLOG ) 100 UNIT/ML injection CBG 70 - 120: 0 units CBG 121 - 150: 2 units CBG 151 - 200: 3 units CBG 201 - 250: 5 units CBG 251 - 300: 8 units CBG 301 - 350: 11 units CBG 351 - 400: 15 units CBG > 400: call MD 05/04/23   Alexander, Natalie, DO  lisinopril  (ZESTRIL ) 20 MG tablet Take 20 mg by mouth daily. 08/01/22   [provider]  magnesium  oxide (MAG-OX) 400 (240 Mg) MG tablet Take 400 mg by mouth daily.    [provider]  Multiple Vitamins-Minerals (MULTIVITAMIN WITH MINERALS) tablet Take 1 tablet by mouth daily.    [provider]  naloxone [facility]) nasal spray 4 mg/0.1 mL Place 1 spray into the nose 3 (three) times daily as needed (opioid overodse).    [provider]  oxyCODONE  (OXY IR/ROXICODONE ) 5 MG immediate release tablet Take 1 tablet (5 mg total) by mouth 3 (three) times daily as needed for severe pain (pain score 7-10). 05/04/23   Alexander, Natalie, DO  polyethylene glycol (MIRALAX  /  GLYCOLAX ) 17 g packet Take 17 g by mouth daily.    [provider]  Vitamin D , Ergocalciferol , (DRISDOL ) 1.25 MG (50000 UNIT) CAPS capsule Take 50,000 Units by mouth every 7 (seven) days. thursday    [provider]    Social History   Socioeconomic History   Marital status: Widowed    Spouse name: Not on file   Number of children: Not on file   Years of education: Not on file   Highest education level: Not on file  Occupational History   Not on file  Tobacco Use   Smoking status: Never   Smokeless tobacco: Never  Vaping Use   Vaping status: Never Used  Substance and Sexual Activity   Alcohol  use: No   Drug use: No   Sexual activity: Not Currently  Other Topics Concern   Not on file  Social History Narrative   Lives  at home by herself. Ambulates with a cane.   Social Drivers of Corporate investment banker Strain: Not on file  Food Insecurity: Patient Unable To Answer (04/28/2023)   Hunger Vital Sign    Worried About Running Out of Food in the Last Year: Patient unable to answer    Ran Out of Food in the Last Year: Patient unable to answer  Transportation Needs: Patient Unable To Answer (04/28/2023)   PRAPARE - Transportation    Lack of Transportation (Medical): Patient unable to answer    Lack of Transportation (Non-Medical): Patient unable to answer  Physical Activity: Not on file  Stress: Not on file  Social Connections: Unknown (04/28/2023)   Social Connection and Isolation Panel    Frequency of Communication with Friends and Family: Patient unable to answer    Frequency of Social Gatherings with Friends and Family: Patient unable to answer    Attends Religious Services: Patient unable to answer    Active Member of Clubs or Organizations: Patient unable to answer    Attends Banker Meetings: Patient unable to answer    Marital Status: Widowed  Recent Concern: Social Connections - Socially Isolated (04/09/2023)   Social Connection and  Isolation Panel    Frequency of Communication with Friends and Family: Twice a week    Frequency of Social Gatherings with Friends and Family: Never    Attends Religious Services: Never    Database administrator or Organizations: No    Attends Banker Meetings: Never    Marital Status: Widowed  Intimate Partner Violence: Patient Unable To Answer (04/28/2023)   Humiliation, Afraid, Rape, and Kick questionnaire    Fear of Current or Ex-Partner: Patient unable to answer    Emotionally Abused: Patient unable to answer    Physically Abused: Patient unable to answer    Sexually Abused: Patient unable to answer     Family History  Problem Relation Age of Onset   Breast cancer Mother 64   Breast cancer Maternal Aunt     ROS: Otherwise negative unless mentioned in HPI  Physical Examination  Vitals:   11/06/23 1031  BP: (!) 184/78  Pulse: 82  Resp: 17  Temp: 98.1 F (36.7 C)  SpO2: 100%   Body mass index is 25.96 kg/m.  General:  WDWN in NAD Gait: Not observed HENT: WNL, normocephalic Pulmonary: normal non-labored breathing, without Rales, rhonchi,  wheezing Cardiac: regular, without  Murmurs, rubs or gallops; without carotid bruits Abdomen: Positive bowel sounds throughout, soft, NT/ND, no masses Skin: without rashes Vascular Exam/Pulses: Bilateral lower extremities with positive doppler pulses in both DP and PT. Doppler stronger on her right side vs Left side.  Extremities: with ischemic changes, with Gangrene , without cellulitis; with open wounds;  Musculoskeletal: no muscle wasting or atrophy  Neurologic: A&O X 1, Hx of severe dementia, HX of CVA with contracture to left side with hemiparesis. Speech is fluent/normal. Psychiatric:  The pt has Abnormal- Patient with severe dementia and is under the care of [facility] of [place] .  Happy affect. Lymph:  Unremarkable  CBC    Component Value Date/Time   WBC 10.9 (H) 05/02/2023 0502   RBC 2.78 (L) 05/02/2023  0502   HGB 8.6 (L) 05/02/2023 0502   HGB 8.6 (L) 09/25/2013 0410   HCT 26.6 (L) 05/02/2023 0502   HCT 27.0 (L) 09/24/2013 0409   PLT 263 05/02/2023 0502   PLT 192 09/24/2013 0409   MCV 95.7 05/02/2023 0502  MCV 96 09/24/2013 0409   MCH 30.9 05/02/2023 0502   MCHC 32.3 05/02/2023 0502   RDW 13.8 05/02/2023 0502   RDW 13.8 09/24/2013 0409   LYMPHSABS 2.3 04/28/2023 1532   LYMPHSABS 2.5 09/24/2013 0409   MONOABS 0.5 04/28/2023 1532   MONOABS 0.7 09/24/2013 0409   EOSABS 0.5 04/28/2023 1532   EOSABS 0.4 09/24/2013 0409   BASOSABS 0.0 04/28/2023 1532   BASOSABS 0.0 09/24/2013 0409    BMET    Component Value Date/Time   NA 137 05/02/2023 0502   NA 143 09/24/2013 0409   K 4.2 05/02/2023 0502   K 3.6 09/24/2013 0409   CL 107 05/02/2023 0502   CL 108 (H) 09/24/2013 0409   CO2 22 05/02/2023 0502   CO2 27 09/24/2013 0409   GLUCOSE 134 (H) 05/02/2023 0502   GLUCOSE 222 (H) 09/24/2013 0409   BUN 18 05/02/2023 0502   BUN 7 09/24/2013 0409   CREATININE 0.54 05/03/2023 0558   CREATININE 0.77 09/24/2013 0409   CALCIUM  8.9 05/02/2023 0502   CALCIUM  7.7 (L) 09/24/2013 0409   GFRNONAA >60 05/03/2023 0558   GFRNONAA >60 09/24/2013 0409   GFRAA >60 09/26/2019 1952   GFRAA >60 09/24/2013 0409    COAGS: Lab Results  Component Value Date   INR 1.3 (H) 04/29/2023   INR 1.2 08/26/2022   INR 1.2 08/08/2022     Non-Invasive Vascular Imaging:   Date of Surgery: 04/30/2023   Surgeon(s):DEW,JASON     Assistants:none   Pre-operative Diagnosis: PAD with gangrene LLE   Post-operative diagnosis:  Same   Procedure(s) Performed:             1.  Ultrasound guidance for vascular access right femoral artery             2.  Catheter placement into left common femoral artery from right femoral approach             3.  Aortogram and selective left lower extremity angiogram             4.  Percutaneous transluminal angioplasty of left anterior tibial artery with 2.5 mm diameter angioplasty  balloon in the midsegment and 3 mm diameter angioplasty balloon in the proximal segment in the tibioperoneal trunk             5.  Percutaneous transluminal angioplasty of left SFA and popliteal artery with 5 mm diameter by 30 cm length Lutonix drug-coated angioplasty balloon             6.  StarClose closure device right femoral artery  Statin:  Yes.   Beta Blocker:  No. Aspirin :  Yes.   ACEI:  Yes.   ARB:  No. CCB use:  No Other antiplatelets/anticoagulants:  Yes.   Plavix  75 mg daily   ASSESSMENT/PLAN: This is a 89 y.o. female who presents from wound clinic with wound/ulcer to her left foot with gangrene to second toe. Wounds are not healing and wound clinic questions via blood flow to her foot. Patient sent to the emergency room to evaluate.   Patient has her Guardian from [facility] of [place]  at her side today. She consents to the patient having a procedure today for the complications to the patients left foot with non healing wounds. The patient will undergo a left lower extremity angiogram with possible intervention.   I discussed in detail with the patient and DSS Guardian the procedure, benefits, risks and complications. DSS has agreed to consent for  the patient to have the procedure today. I answered all their questions today. Patient will be placed on a heparin  infusion. DSS Guardian endorses the patient had 2 crackers and some Ginger Ale at 9:00 am this morning.    -I discussed the case in detail with Dr Cordella Shawl MD and he agrees with the plan.   [name] [facility] 11/06/2023 10:51 AM

## 2023-11-06 NOTE — Consult Note (Signed)
 PHARMACY - ANTICOAGULATION CONSULT NOTE  Pharmacy Consult for Heparin  Indication: LLE ischemia  Allergies  Allergen Reactions   Metformin  And Related Nausea And Vomiting    Patient Measurements: Height: 5' 4 (162.6 cm) Weight: 68.6 kg (151 lb 3.8 oz) IBW/kg (Calculated) : 54.7 HEPARIN  DW (KG): 68.4  Vital Signs: Temp: 98.1 F (36.7 C) (09/26 1031) Temp Source: Oral (09/26 1031) BP: 184/78 (09/26 1031) Pulse Rate: 82 (09/26 1031)  Labs: No results for input(s): HGB, HCT, PLT, APTT, LABPROT, INR, HEPARINUNFRC, HEPRLOWMOCWT, CREATININE, CKTOTAL, CKMB, TROPONINIHS in the last 72 hours.  CrCl cannot be calculated (Patient's most recent lab result is older than the maximum 21 days allowed.).   Medical History: Past Medical History:  Diagnosis Date   Carpal tunnel syndrome on both sides    Diabetes (HCC)    Diabetic polyneuropathy (HCC)    Diverticulitis    Hyperlipidemia    Hypertension    Osteoarthritis    Vaginal prolapse     Medications:  Plavix  75mg  daily. Last dose unknown. No other antiplatelets/anticoagulants on file.  Assessment: 13 YOF with history of T2DM, PVD, and dementia. Presents to ED w/ LLE ischemia. Pharmacy has been consulted to dose continuous heparin  infusion. Baseline labs pending.  Goal of Therapy:  Heparin  level 0.3-0.7 units/ml Monitor platelets by anticoagulation protocol: Yes   Plan:  Give 4100 units bolus x 1 Start heparin  infusion at 1100 units/hr Check anti-Xa level in 8 hours and daily while on heparin  Continue to monitor H&H and platelets  Deleta Colt PharmD Candidate 2026 11/06/2023,11:03 AM

## 2023-11-06 NOTE — H&P (Addendum)
 History and Physical   [name] FMW:[id number] DOB: 03-15-1933 DOA: 11/06/2023  PCP: Rudolpho Norleen BIRCH, MD   Patient coming from: SNF/Wound Clinic  Chief Complaint: Worsening wound / L toe changes  HPI: [name] is a 89 y.o. female with medical history significant of hypertension, hyperlipidemia, diabetes, carotid artery disease, PAD, CVA with left hemiparesis, dementia, hyperaldosteronism, chronic wound presenting with worsening wound changes and toe changes.  Patient obtained assistance of chart review due to patient's history of dementia.  Patient has chronic left foot ulcer for which she is followed at the wound clear clinic.  She was seen today and found to have concerns for issues with decreased blood flow due to dusky/darker coloration to her left toe.  Has history of amputations of most of her other toes other than this left second toe which is now having a changes.  Sent to the ED for further evaluation.  Patient is demented and unable to fully participate in review of systems.  ED Course: Vital signs in the ED notable for temperature 96-98 F, heart rate in the 50s-80s, blood pressure in the 160s-210 systolic.  Lab workup included CMP with glucose 148, albumin 3.0.  CBC with hemoglobin stable at 9.6, platelets 134.  PT, PTT, INR normal.  Blood cultures pending.  Patient received heparin  and vancomycin  in the ED.  Vascular surgery was consulted and took patient for left lower extremity angiography.  Patient on floor post procedure.  Review of Systems: Patient is demented and unable to fully participate in review of systems.  Past Medical History:  Diagnosis Date   Carpal tunnel syndrome on both sides    CVA (cerebral vascular accident) (HCC) 11/14/2019   Diabetes (HCC)    Diabetic polyneuropathy (HCC)    Diverticulitis    Hyperlipidemia    Hypertension    Osteoarthritis    Sepsis (HCC) 03/22/2018   Suspected UTI 04/09/2023   UTI (urinary tract infection)  09/20/2019   Vaginal prolapse     Past Surgical History:  Procedure Laterality Date   AMPUTATION Left 05/01/2023   Procedure: AMPUTATION, FOOT, RAY;  Surgeon: Lennie Barter, DPM;  Location: [facility];  Service: Orthopedics/Podiatry;  Laterality: Left;  LEFT PARTIAL FIRST RAY   AMPUTATION TOE Left 08/13/2022   Procedure: EXCISION OF 5TH RAY;  Surgeon: Ashley Soulier, DPM;  Location: [facility];  Service: Orthopedics/Podiatry;  Laterality: Left;   CESAREAN SECTION     IRRIGATION AND DEBRIDEMENT FOOT Left 01/09/2022   Procedure: IRRIGATION AND DEBRIDEMENT FOOT WITH BONE BIOPSY;  Surgeon: Ashley Soulier, DPM;  Location: [facility];  Service: Podiatry;  Laterality: Left;   LOWER EXTREMITY ANGIOGRAPHY Left 06/11/2021   Procedure: Lower Extremity Angiography;  Surgeon: Jama Cordella KANDICE, MD;  Location: [facility];  Service: Cardiovascular;  Laterality: Left;   LOWER EXTREMITY ANGIOGRAPHY Left 08/11/2022   Procedure: Lower Extremity Angiography;  Surgeon: Jama Cordella KANDICE, MD;  Location: [facility];  Service: Cardiovascular;  Laterality: Left;   LOWER EXTREMITY ANGIOGRAPHY Left 04/30/2023   Procedure: Lower Extremity Angiography;  Surgeon: Marea Selinda RAMAN, MD;  Location: [facility];  Service: Cardiovascular;  Laterality: Left;   pessary     REPLACEMENT TOTAL KNEE BILATERAL     VENTRAL HERNIA REPAIR     VENTRAL HERNIA REPAIR N/A 01/13/2016   Procedure: HERNIA REPAIR VENTRAL ADULT WITH SMALL BOWEL RESECTION, LYSIS OF ADHESIONS;  Surgeon: Dorothyann LITTIE Husk, MD;  Location: [facility];  Service: General;  Laterality: N/A;  Social History  reports that she has never smoked. She has never used smokeless tobacco. She reports that she does not drink alcohol  and does not use drugs.  Allergies  Allergen Reactions   Metformin  And Related Nausea And Vomiting    Family History  Problem Relation Age of Onset   Breast cancer Mother 92   Breast cancer Maternal Aunt   Reviewed on  admission  Prior to Admission medications   Medication Sig Start Date End Date Taking? Authorizing Provider  acetaminophen  (TYLENOL ) 325 MG tablet Take 2 tablets (650 mg total) by mouth every 6 (six) hours as needed for mild pain, moderate pain, fever or headache. Patient taking differently: Take 650 mg by mouth every 6 (six) hours as needed for mild pain (pain score 1-3), moderate pain (pain score 4-6), fever or headache. PRN 01/25/16   Loflin, Catherine L, MD  Amino Acids -Protein Hydrolys (FEEDING SUPPLEMENT, PRO-STAT SUGAR FREE 64,) LIQD Take 30 mLs by mouth in the morning and at bedtime.    [provider]  ascorbic acid  (VITAMIN C ) 500 MG tablet Take 500 mg by mouth 2 (two) times daily.    [provider]  aspirin  EC 81 MG tablet Take 81 mg by mouth daily. Swallow whole.    [provider]  atorvastatin  (LIPITOR ) 80 MG tablet Take 1 tablet (80 mg total) by mouth daily. Patient taking differently: Take 80 mg by mouth at bedtime. 09/26/19   Regalado, Belkys A, MD  bisacodyl  (DULCOLAX) 10 MG suppository Place 1 suppository (10 mg total) rectally daily as needed for severe constipation. 05/04/23   Ajayla Iglesias, Natalie, DO  clopidogrel  (PLAVIX ) 75 MG tablet Take 1 tablet (75 mg total) by mouth daily. 09/26/19   Regalado, Belkys A, MD  cyanocobalamin  (VITAMIN B12) 1000 MCG tablet Take 1,000 mcg by mouth daily.    [provider]  feeding supplement, GLUCERNA SHAKE, (GLUCERNA SHAKE) LIQD Take 237 mLs by mouth 3 (three) times daily between meals. 05/04/23   Donika Butner, Natalie, DO  ferrous sulfate  325 (65 FE) MG tablet Take 325 mg by mouth every other day.    [provider]  Glucagon , rDNA, (GLUCAGON  EMERGENCY) 1 MG KIT Inject 1 mg into the muscle once as needed (low blood sugar). 08/26/22   [provider]  hydrALAZINE  (APRESOLINE ) 25 MG tablet Take 25 mg by mouth daily as needed (SBP> 160). PRN 03/04/23   [provider]  insulin  aspart  (NOVOLOG ) 100 UNIT/ML injection CBG 70 - 120: 0 units CBG 121 - 150: 2 units CBG 151 - 200: 3 units CBG 201 - 250: 5 units CBG 251 - 300: 8 units CBG 301 - 350: 11 units CBG 351 - 400: 15 units CBG > 400: call MD 05/04/23   Jory Welke, Natalie, DO  lisinopril  (ZESTRIL ) 20 MG tablet Take 20 mg by mouth daily. 08/01/22   [provider]  magnesium  oxide (MAG-OX) 400 (240 Mg) MG tablet Take 400 mg by mouth daily.    [provider]  Multiple Vitamins-Minerals (MULTIVITAMIN WITH MINERALS) tablet Take 1 tablet by mouth daily.    [provider]  naloxone [facility]) nasal spray 4 mg/0.1 mL Place 1 spray into the nose 3 (three) times daily as needed (opioid overodse).    [provider]  oxyCODONE  (OXY IR/ROXICODONE ) 5 MG immediate release tablet Take 1 tablet (5 mg total) by mouth 3 (three) times daily as needed for severe pain (pain score 7-10). 05/04/23   Braeson Rupe, Natalie, DO  polyethylene glycol (  MIRALAX  / GLYCOLAX ) 17 g packet Take 17 g by mouth daily.    [provider]  Vitamin D , Ergocalciferol , (DRISDOL ) 1.25 MG (50000 UNIT) CAPS capsule Take 50,000 Units by mouth every 7 (seven) days. thursday    [provider]    Physical Exam: Vitals:   11/06/23 1429 11/06/23 1440 11/06/23 1455 11/06/23 1510  BP: (!) 196/85 (!) 185/78 (!) 171/72 (!) 194/76  Pulse: 70 68 69 72  Resp: 14 14 16 15   Temp:  (!) 96.6 F (35.9 C)    TempSrc:  Temporal    SpO2: 98% 100% 100% 100%  Weight:      Height:        Physical Exam Constitutional:      General: She is not in acute distress.    Appearance: Normal appearance.  HENT:     Head: Normocephalic and atraumatic.     Mouth/Throat:     Mouth: Mucous membranes are moist.     Pharynx: Oropharynx is clear.  Eyes:     Extraocular Movements: Extraocular movements intact.     Pupils: Pupils are equal, round, and reactive to light.  Cardiovascular:     Rate and Rhythm: Normal rate and regular rhythm.      Pulses: Normal pulses.     Heart sounds: Normal heart sounds.  Pulmonary:     Effort: Pulmonary effort is normal. No respiratory distress.     Breath sounds: Normal breath sounds.  Abdominal:     General: Bowel sounds are normal. There is no distension.     Palpations: Abdomen is soft.     Tenderness: There is no abdominal tenderness.  Musculoskeletal:        General: No swelling or deformity.     Comments: Status post multiple toe amputations at left lower extremity.  Left second toe remains discolored with chronic wound partially visible.  Skin:    General: Skin is warm and dry.  Neurological:     General: No focal deficit present.     Mental Status: Mental status is at baseline.    Labs on Admission: I have personally reviewed following labs and imaging studies  CBC: Recent Labs  Lab 11/06/23 1037  WBC 9.4  HGB 9.6*  HCT 31.8*  MCV 100.0  PLT 134*    Basic Metabolic Panel: Recent Labs  Lab 11/06/23 1037  NA 141  K 4.3  CL 104  CO2 24  GLUCOSE 148*  BUN 21  CREATININE 0.54  CALCIUM  8.9    GFR: Estimated Creatinine Clearance: 47.2 mL/min (by C-G formula based on SCr of 0.54 mg/dL).  Liver Function Tests: Recent Labs  Lab 11/06/23 1037  AST 30  ALT 11  ALKPHOS 67  BILITOT 0.8  PROT 6.7  ALBUMIN 3.0*    Urine analysis:    Component Value Date/Time   COLORURINE YELLOW (A) 05/03/2023 1329   APPEARANCEUR HAZY (A) 05/03/2023 1329   LABSPEC 1.020 05/03/2023 1329   PHURINE 5.0 05/03/2023 1329   GLUCOSEU NEGATIVE 05/03/2023 1329   HGBUR NEGATIVE 05/03/2023 1329   BILIRUBINUR NEGATIVE 05/03/2023 1329   BILIRUBINUR neg 08/08/2019 0922   KETONESUR NEGATIVE 05/03/2023 1329   PROTEINUR NEGATIVE 05/03/2023 1329   UROBILINOGEN 0.2 08/08/2019 0922   NITRITE NEGATIVE 05/03/2023 1329   LEUKOCYTESUR TRACE (A) 05/03/2023 1329    Radiological Exams on Admission: PERIPHERAL VASCULAR CATHETERIZATION Result Date: 11/06/2023 See surgical note for  result.  EKG: Independently reviewed.  Sinus rhythm at 70 bpm.  Nonspecific T  wave changes.  Baseline artifact.  Assessment/Plan Active Problems:   Type 2 diabetes mellitus with polyneuropathy (HCC)   Mixed hyperlipidemia   Essential hypertension   Carotid artery disease   History of stroke   Left hemiparesis (HCC)   Right hemiparesis (HCC)   PAD (peripheral artery disease)   Luetscher's syndrome   Dementia (HCC)   Critical limb ischemia of left lower extremity (HCC)   PAD Critical limb ischemia Chronic lower extremity wound > Patient presented from wound care with concerns about blood flow due to discoloration of left second toe.  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 > Lesser surgery consulted and recommended heparin  and took patient for angiography. > No note yet to indicate results of angiography but procedure documentation indicates a stent was used during the procedure. > Received Vancomycin  in the ED. WBC normal. - Monitoring on telemetry - Appreciate vascular surgery recommendations and assistance - Hold off on further antibiotics for now - WOC consult - Follow-up blood cultures, trend fever curve and WBC - Continue with heparin  - Continue aspirin , Plavix  unless vascular recommends otherwise - Continue home atorvastatin   Hypertension - Continue home lisinopril   Hyperlipidemia. - Continue home atorvastatin   Diabetes - SSI  Carotid artery disease - Continue atorvastatin  - Heparin , ASA, Plavix  as above  Dementia - Pleasantly demented - DSS guardian   History of CVA > Left hemiparesis . - Continue atorvastatin  - Heparin , ASA, Plavix  as above  DVT prophylaxis: Heparin  Code Status:   Full Family Communication:  None on admission.  Disposition Plan:   Patient is from:  SNF  Anticipated DC to:  Same as above  Anticipated DC date:  1 to 3 days  Anticipated DC barriers: None  Consults called:  None Admission status:  Observation,  telemetry  Severity of Illness: The appropriate patient status for this patient is INPATIENT. Inpatient status is judged to be reasonable and necessary in order to provide the required intensity of service to ensure the patient's safety. The patient's presenting symptoms, physical exam findings, and initial radiographic and laboratory data in the context of their chronic comorbidities is felt to place them at high risk for further clinical deterioration. Furthermore, it is not anticipated that the patient will be medically stable for discharge from the hospital within 2 midnights of admission.   * I certify that at the point of admission it is my clinical judgment that the patient will require inpatient hospital care spanning beyond 2 midnights from the point of admission due to high intensity of service, high risk for further deterioration and high frequency of surveillance required.DEWAINE Marsa KATHEE Seena MD [facility]  How to contact the [facility] Attending or Consulting provider 7A - 7P or covering provider during after hours 7P -7A, for this patient?   Check the care team in [facility] and look for a) attending/consulting [facility] provider listed and b) the [facility] team listed Log into [email or web address] and use [facility] universal password to access. If you do not have the password, please contact the hospital operator. Locate the [facility] provider you are looking for under [facility] and page to a number that you can be directly reached. If you still have difficulty reaching the provider, please page the [facility] (Director on Call) for the Hospitalists listed on amion for assistance.  11/06/2023, 3:27 PM

## 2023-11-06 NOTE — Op Note (Signed)
 [facility]  Percutaneous Study/Intervention Procedural Note   Date of Surgery: 11/06/2023  Surgeon:  [name] Shawl  Pre-operative Diagnosis: Atherosclerotic occlusive disease bilateral lower extremities with left lower extremity with rest pain and ulceration.  Post-operative diagnosis:  Same  Procedure(s) Performed:             1.  Introduction catheter into left lower extremity 3rd order catheter placement               2.    Contrast injection left lower extremity for distal runoff             3.  Percutaneous transluminal angioplasty and stent placement left popliteal artery to 4 mm             4.  Percutaneous transluminal angioplasty and stent placement to the peroneal artery to 3.75 mm             5.  Mechanical thrombectomy of the popliteal artery occlusion and in-stent restenosis with the penumbra CAT 6 bolt catheter.               6.  Celt closure right common femoral arteriotomy  Anesthesia: Conscious sedation was administered under my direct supervision by the interventional radiology RN. IV Versed  plus fentanyl  were utilized. Continuous ECG, pulse oximetry and blood pressure was monitored throughout the entire procedure.  Conscious sedation was for a total of 101 minutes.  Sheath: 6 [place] Rabie right common femoral retrograde  Contrast: 90 cc  Fluoroscopy Time: 21.1 minutes  Indications:  [name] presents with increasing pain of the left lower extremity.  She has worsening ulceration with gangrenous changes to the toe as well as skin breakdown of the heel left lower extremity.  This places her at increased risk for limb loss.  This suggests the patient is having limb threatening ischemia. The risks and benefits are reviewed all questions answered patient agrees to proceed.  Procedure:   [name] is a 89 y.o. y.o. female who was identified and appropriate procedural time out was performed.  The patient was then placed supine on the  table and prepped and draped in the usual sterile fashion.    Ultrasound was placed in the sterile sleeve and the right groin was evaluated the right common femoral artery was echolucent and pulsatile indicating patency.  Image was recorded for the permanent record and under real-time visualization a microneedle was inserted into the common femoral artery followed by the microwire and then the micro-sheath.  A J-wire was then advanced through the micro-sheath and a  5 [place] sheath was then inserted over a J-wire. J-wire was then advanced and a 5 French pigtail catheter was positioned at the level of T12.  AP projection of the aorta was then obtained. Pigtail catheter was repositioned to above the bifurcation and a RAO view of the pelvis was obtained.  Subsequently a pigtail catheter with an Advantage wire was used to cross the aortic bifurcation.  The catheter and wire were advanced down into the left distal external iliac artery. Oblique view of the femoral bifurcation was then obtained and subsequently the wire was reintroduced and the pigtail catheter negotiated into the SFA representing third order catheter placement. Distal runoff was then performed.  6000 units of heparin  was then given and allowed to circulate for several minutes.  A 6 French Rabie sheath was advanced up and over the bifurcation and positioned in the femoral artery  KMP catheter and  advantage Glidewire were then negotiated down into the distal popliteal. Catheter was then advanced. Hand injection contrast demonstrated the tibial anatomy in further detail.  The detector was then positioned more proximally and the focal 70% stenosis in the proximal SFA was treated with a 6 mm Lutonix balloon inflated to 10 atm for 1 minute.  Follow-up imaging demonstrated less than 10% residual stenosis and the detector was repositioned to the knee and a steep oblique to visualize the popliteal artery around the knee prosthesis.  Using a Kumpe catheter  with the advantage wire I was able to cross the occlusion and advance the catheter into the mid peroneal.  Hand-injection of contrast verified intraluminal positioning.  Wire was reintroduced.  A 3 mm Ultraverse balloon was used to angioplasty the proximal peroneal and extended through the occlusion.  The inflation was for 1 minute at 12 atm. Follow-up imaging demonstrated greater than 60% residual stenosis.  A 4 mm balloon was then used to treat the occluded portion of the popliteal stent with inflation to 12 atm for approximately 1 minute.  Follow-up imaging now demonstrated that there was contrast moving through the stented area but what appeared to be a large amount of thrombotic material within the stent.  The penumbra 6 French bolt catheter was then prepped on the field the wire was exchanged for a 0.014 wire and mechanical thrombectomy was performed of the mid to distal popliteal.  A large amount of material was retrieved.  The distal 4 cm of stent was completely cleared of thrombus.  However there were 3 areas that remained stenotic greater than 80%.  With the 0.014 wire in place a 3.75 x 38 Esprit stent was advanced into the proximal peroneal.  This was deployed to 12 atm.  It was then postdilated distally with a 4 mm Jade balloon inflated to just 4 to 6 atm and then the balloon was repositioned and the proximal two thirds of the Esprit stent were dilated with a 4 mm Jade balloon to an inflation of 10 atm.  Next, I attempted to advance a life stent over the 014 wire but given the tortuosity this did not work and a seeker catheter was advanced down into the peroneal and the 014 wire exchanged for a 0.035 wire.  This did allow the life stent to track and this was deployed with its leading edge beginning approximately 3 cm proximal to the endpoint of the previous stent.  Stent was deployed without difficulty it was then postdilated with a 4 mm x 60 mm Lutonix drug-eluting balloon inflated to 12 atm for  approximately 1 minute.  2 separate inflations were required to cover the newly placed stent.  Follow-up imaging demonstrated wide patency of the popliteal artery as well as the tibioperoneal trunk and peroneal.  Peroneal is now patent down to the foot with extensive collaterals crossing the ankle and reconstituting the plantar arteries.  There is less than 10% residual stenosis throughout.  After review of these images the sheath is pulled into the right external iliac oblique of the common femoral is obtained and a Celt device deployed. There no immediate Complications.  Findings:  The abdominal aorta is opacified with a bolus injection contrast. Renal arteries are single and widely patent without evidence of hemodynamically significant stenosis.  The aorta itself has diffuse disease but no hemodynamically significant lesions. The common and external iliac arteries are widely patent bilaterally.  The left common femoral is widely patent as is the profunda femoris.  The SFA does indeed have a significant stenosis in its proximal portion this is approximately 70%.  Previously placed distal SFA popliteal stent is noted and this occludes in its midportion remains occluded throughout the trifurcation down into the peroneal for a distance of approximately 2 to 3 cm.  The anterior tibial posterior tibial are occluded from the origins down to the foot.  The peroneal is patent from its reconstitution down to the foot with extensive collaterals crossing the ankle and the predominant filling of the plantar arteries as the primary runoff to the forefoot.    Following angioplasty of the proximal SFA there is now less than 10% residual stenosis.  Following thrombectomy of the mid and distal popliteal down into the trifurcation there is now a marked improvement with a decrease in the thrombus burden and forward flow is reestablished.  Following angioplasty and stent placement of the peroneal there is now patency with  less than 10% residual stenosis.  Following angioplasty and stent placement of the mid and distal popliteal there is now wide patency with less than 10% residual stenosis.  There is now inline flow to the foot via the peroneal with extensive collaterals crossing the ankle.    Summary: Successful recanalization left lower extremity for limb salvage                           Disposition: Patient was taken to the recovery room in stable condition having tolerated the procedure well.  [name] 11/06/2023,4:29 PM

## 2023-11-06 NOTE — ED Notes (Addendum)
 IV team in room at this time. IV team RN updated about request for 2 IV's due to medications ordered. RN stated she would attempt to establish 2 IV's and gain blood for 2nd set cultures.

## 2023-11-06 NOTE — ED Triage Notes (Signed)
 Coming from wound care clinic. Left foot has all toes besides third toe amputated. The Md's were concerned about blood flow to toes and feet and decided to send pt over to ER. patient has a Hx of dementia. Patient alert to self bu not to time.

## 2023-11-06 NOTE — Progress Notes (Signed)
 Vascular Surgery  Called to patient bedside for bleeding from RIGHT groin access site. Per nursing significant bleeding noted ~ 8pm. Pressure held for 20 minutes. Bleeding subsided. Heparin  stopped. Patient has difficulty keeping leg straight secondary to dementia. Upon my examine minimal bleeding. However, pressure held for an additional 20 minutes. No further bleeding, access site soft. Patient without complaint. Palpable RIGHT DP. Check PTT ensure not supratherapeutic on Heparin  gtt. Will determine timing of restarting Heparin  once results obtained and after observation. AM HgB Will monitor

## 2023-11-06 NOTE — ED Notes (Addendum)
 3 RN's unable to establish IV. RN able to get 2 tubes of blood To send to lab via straight stick.  IV team consult placed. Lab called to send phlebotomist to attempt to collect 2x cultures, APTT, Protime-INR.

## 2023-11-06 NOTE — ED Notes (Signed)
Phlebotomy in room. 

## 2023-11-06 NOTE — Consult Note (Addendum)
 PHARMACY - ANTICOAGULATION CONSULT NOTE  Pharmacy Consult for Heparin  Indication: LLE ischemia  Allergies  Allergen Reactions   Metformin  And Related Nausea And Vomiting    Patient Measurements: Height: 5' 4 (162.6 cm) Weight: 68.6 kg (151 lb 3.8 oz) IBW/kg (Calculated) : 54.7 HEPARIN  DW (KG): 68.4  Vital Signs: Temp: 98.1 F (36.7 C) (09/26 2030) Temp Source: Oral (09/26 2030) BP: 139/65 (09/26 2030) Pulse Rate: 93 (09/26 2030)  Labs: Recent Labs    11/06/23 1037 11/06/23 1126  HGB 9.6*  --   HCT 31.8*  --   PLT 134*  --   APTT  --  36  LABPROT  --  14.8  INR  --  1.1  CREATININE 0.54  --     Estimated Creatinine Clearance: 47.2 mL/min (by C-G formula based on SCr of 0.54 mg/dL).   Medical History: Past Medical History:  Diagnosis Date   Carpal tunnel syndrome on both sides    CVA (cerebral vascular accident) (HCC) 11/14/2019   Diabetes (HCC)    Diabetic polyneuropathy (HCC)    Diverticulitis    Hyperlipidemia    Hypertension    Osteoarthritis    Sepsis (HCC) 03/22/2018   Suspected UTI 04/09/2023   UTI (urinary tract infection) 09/20/2019   Vaginal prolapse     Medications:  Plavix  75mg  daily. Last dose unknown. No other antiplatelets/anticoagulants on file.  Assessment: 42 YOF with history of T2DM, PVD, and dementia. Presents to ED w/ LLE ischemia. Pharmacy has been consulted to dose continuous heparin  infusion. Baseline labs pending.  Goal of Therapy:  Heparin  level 0.3-0.7 units/ml Monitor platelets by anticoagulation protocol: Yes   Plan:  9/26:  RN called to say heparin  gtt had been turned off d/t bleeding @ 2025,   MD requesting to recheck HL, aPTT before resuming heparin .  - restart heparin  at 500 units/hr @ 0000 if HL, aPTT ok - STAT HL and aPTT ordered for 2200   Makai Agostinelli D 11/06/2023,10:01 PM

## 2023-11-06 NOTE — ED Provider Notes (Signed)
 [facility] Provider Note    Event Date/Time   First MD Initiated Contact with Patient 11/06/23 1034     (approximate)  History   Chief Complaint: Wound Check  HPI  [name] is a 89 y.o. female with a past medical history of dementia, diabetes, CVA with left hemiparesis, hypertension, hyperlipidemia, presents to the emergency department with concerns for left foot ulceration and decreased circulation.  Patient is coming from the wound clinic where she receives wound care for a chronic ulceration of the left heel/foot.  Patient has had amputations of most of her toes on the left side besides the second toe which has become dark over the last few weeks.  Per report they are concerned given her worsening toe appearance with worsening left foot ulceration that the patient may not be receiving adequate blood flow to the area and they sent her to the emergency department for evaluation.  Patient has a history of dementia and cannot contribute much to the history.  Physical Exam   Triage Vital Signs: ED Triage Vitals  Encounter Vitals Group     BP 11/06/23 1031 (!) 184/78     Girls Systolic BP Percentile --      Girls Diastolic BP Percentile --      Boys Systolic BP Percentile --      Boys Diastolic BP Percentile --      Pulse Rate 11/06/23 1031 82     Resp 11/06/23 1031 17     Temp 11/06/23 1031 98.1 F (36.7 C)     Temp Source 11/06/23 1031 Oral     SpO2 11/06/23 1031 100 %     Weight 11/06/23 1032 151 lb 3.8 oz (68.6 kg)     Height 11/06/23 1032 5' 4 (1.626 m)     Head Circumference --      Peak Flow --      Pain Score 11/06/23 1032 0     Pain Loc --      Pain Education --      Exclude from Growth Chart --     Most recent vital signs: Vitals:   11/06/23 1031  BP: (!) 184/78  Pulse: 82  Resp: 17  Temp: 98.1 F (36.7 C)  SpO2: 100%    General: Awake, no distress.  CV:  Good peripheral perfusion.  Regular rate and rhythm  Resp:  Normal  effort.  Equal breath sounds bilaterally.  Abd:  No distention.  Soft, nontender. Other:  Patient has darkening of the left second toe.  Has a 3 cm so heel ulceration to the left foot.  Has darkening of the left foot.  Unable to palpate pulses.  Very faint dopplerable DP and PT pulse.   ED Results / Procedures / Treatments   EKG  EKG viewed and interpreted by myself shows a normal sinus rhythm at 67 bpm with a narrow QRS, normal axis, normal intervals, no concerning ST changes.  MEDICATIONS ORDERED IN ED: Medications  vancomycin  (VANCOCIN ) IVPB 1000 mg/200 mL premix (has no administration in time range)     IMPRESSION / MDM / ASSESSMENT AND PLAN / ED COURSE  I reviewed the triage vital signs and the nursing notes.  Patient's presentation is most consistent with acute presentation with potential threat to life or bodily function.  Patient presents to the emergency department for worsening heel ulceration of the left foot darkening of the left foot and concerns for ischemia.  Patient has very faint dopplerable  pulses but no palpable pulses.  Overall examination is consistent with ischemic limb.  Given the worsening ulceration we will cover with IV antibiotics we will send labs and cultures.  I spoke to Dr. Jama of vascular surgery who would like the patient covered with heparin  and he will see in consultation.  Patient will require admission to the hospitalist once her emergency department workup has been completed.  Patient's lab work has resulted showing an overall reassuring chemistry with a reassuring CBC with a normal white blood cell count.  Blood cultures have been sent and the patient has been started on IV antibiotics and heparin .  Patient was taken to specials for lower extremity angiography prior to the patient being admitted to the hospital.  I spoke to vascular.  Will admit to the hospitalist service.   CRITICAL CARE Performed by: Franky Moores   Total critical care  time: 30 minutes  Critical care time was exclusive of separately billable procedures and treating other patients.  Critical care was necessary to treat or prevent imminent or life-threatening deterioration.  Critical care was time spent personally by me on the following activities: development of treatment plan with patient and/or surrogate as well as nursing, discussions with consultants, evaluation of patient's response to treatment, examination of patient, obtaining history from patient or surrogate, ordering and performing treatments and interventions, ordering and review of laboratory studies, ordering and review of radiographic studies, pulse oximetry and re-evaluation of patient's condition.   FINAL CLINICAL IMPRESSION(S) / ED DIAGNOSES   Ischemic left foot Cellulitis/ulceration  Note:  This document was prepared using Dragon voice recognition software and may include unintentional dictation errors.   Moores Franky, MD 11/06/23 1420

## 2023-11-07 DIAGNOSIS — E1142 Type 2 diabetes mellitus with diabetic polyneuropathy: Secondary | ICD-10-CM

## 2023-11-07 DIAGNOSIS — G8191 Hemiplegia, unspecified affecting right dominant side: Secondary | ICD-10-CM

## 2023-11-07 DIAGNOSIS — Z515 Encounter for palliative care: Secondary | ICD-10-CM

## 2023-11-07 DIAGNOSIS — Z8673 Personal history of transient ischemic attack (TIA), and cerebral infarction without residual deficits: Secondary | ICD-10-CM

## 2023-11-07 DIAGNOSIS — I7025 Atherosclerosis of native arteries of other extremities with ulceration: Secondary | ICD-10-CM

## 2023-11-07 DIAGNOSIS — Z66 Do not resuscitate: Secondary | ICD-10-CM

## 2023-11-07 DIAGNOSIS — Z7189 Other specified counseling: Secondary | ICD-10-CM | POA: Diagnosis not present

## 2023-11-07 DIAGNOSIS — G8194 Hemiplegia, unspecified affecting left nondominant side: Secondary | ICD-10-CM

## 2023-11-07 DIAGNOSIS — E860 Dehydration: Secondary | ICD-10-CM

## 2023-11-07 DIAGNOSIS — E782 Mixed hyperlipidemia: Secondary | ICD-10-CM

## 2023-11-07 DIAGNOSIS — L97509 Non-pressure chronic ulcer of other part of unspecified foot with unspecified severity: Secondary | ICD-10-CM | POA: Diagnosis not present

## 2023-11-07 DIAGNOSIS — I998 Other disorder of circulatory system: Secondary | ICD-10-CM | POA: Diagnosis not present

## 2023-11-07 DIAGNOSIS — I10 Essential (primary) hypertension: Secondary | ICD-10-CM

## 2023-11-07 DIAGNOSIS — I6523 Occlusion and stenosis of bilateral carotid arteries: Secondary | ICD-10-CM

## 2023-11-07 DIAGNOSIS — L03116 Cellulitis of left lower limb: Secondary | ICD-10-CM

## 2023-11-07 LAB — COMPREHENSIVE METABOLIC PANEL WITH GFR
ALT: 10 U/L (ref 0–44)
AST: 20 U/L (ref 15–41)
Albumin: 2.5 g/dL — ABNORMAL LOW (ref 3.5–5.0)
Alkaline Phosphatase: 54 U/L (ref 38–126)
Anion gap: 6 (ref 5–15)
BUN: 18 mg/dL (ref 8–23)
CO2: 24 mmol/L (ref 22–32)
Calcium: 8.1 mg/dL — ABNORMAL LOW (ref 8.9–10.3)
Chloride: 111 mmol/L (ref 98–111)
Creatinine, Ser: 0.55 mg/dL (ref 0.44–1.00)
GFR, Estimated: >60 mL/min (ref >60)
Glucose, Bld: 121 mg/dL — ABNORMAL HIGH (ref 70–99)
Potassium: 3.6 mmol/L (ref 3.5–5.1)
Sodium: 141 mmol/L (ref 135–145)
Total Bilirubin: 0.6 mg/dL (ref 0.0–1.2)
Total Protein: 5.5 g/dL — ABNORMAL LOW (ref 6.5–8.1)

## 2023-11-07 LAB — HEMOGLOBIN A1C
Hgb A1c MFr Bld: 5.9 % — ABNORMAL HIGH (ref 4.8–5.6)
Mean Plasma Glucose: 122.63 mg/dL

## 2023-11-07 LAB — CBC
HCT: 24.6 % — ABNORMAL LOW (ref 36.0–46.0)
Hemoglobin: 7.8 g/dL — ABNORMAL LOW (ref 12.0–15.0)
MCH: 30.7 pg (ref 26.0–34.0)
MCHC: 31.7 g/dL (ref 30.0–36.0)
MCV: 96.9 fL (ref 80.0–100.0)
Platelets: 252 K/uL (ref 150–400)
RBC: 2.54 MIL/uL — ABNORMAL LOW (ref 3.87–5.11)
RDW: 13 % (ref 11.5–15.5)
WBC: 8.5 K/uL (ref 4.0–10.5)
nRBC: 0 % (ref 0.0–0.2)

## 2023-11-07 LAB — HEPARIN LEVEL (UNFRACTIONATED)
Heparin Unfractionated: <0.1 [IU]/mL — ABNORMAL LOW (ref 0.30–0.70)
Heparin Unfractionated: <0.1 [IU]/mL — ABNORMAL LOW (ref 0.30–0.70)
Heparin Unfractionated: 0.22 [IU]/mL — ABNORMAL LOW (ref 0.30–0.70)

## 2023-11-07 LAB — GLUCOSE, CAPILLARY
Glucose-Capillary: 128 mg/dL — ABNORMAL HIGH (ref 70–99)
Glucose-Capillary: 168 mg/dL — ABNORMAL HIGH (ref 70–99)
Glucose-Capillary: 141 mg/dL — ABNORMAL HIGH (ref 70–99)

## 2023-11-07 LAB — HEMOGLOBIN AND HEMATOCRIT, BLOOD
HCT: 26.3 % — ABNORMAL LOW (ref 36.0–46.0)
Hemoglobin: 8.2 g/dL — ABNORMAL LOW (ref 12.0–15.0)

## 2023-11-07 MED ORDER — HEPARIN BOLUS VIA INFUSION
1000.0000 [IU] | Freq: Once | INTRAVENOUS | Status: DC
  Filled 2023-11-07: qty 1000

## 2023-11-07 MED ORDER — HYDRALAZINE HCL 20 MG/ML IJ SOLN
10.0000 mg | Freq: Four times a day (QID) | INTRAMUSCULAR | Status: DC | PRN
  Administered 2023-11-11: 10 mg via INTRAVENOUS
  Filled 2023-11-07: qty 1

## 2023-11-07 MED ORDER — HYDRALAZINE HCL 50 MG PO TABS
50.0000 mg | ORAL_TABLET | Freq: Four times a day (QID) | ORAL | Status: DC | PRN
Start: 2023-11-07 — End: 2023-11-12

## 2023-11-07 MED ORDER — INSULIN ASPART 100 UNIT/ML IJ SOLN
0.0000 [IU] | Freq: Three times a day (TID) | INTRAMUSCULAR | Status: DC
  Administered 2023-11-07: 2 [IU] via SUBCUTANEOUS
  Administered 2023-11-07 – 2023-11-08 (×3): 3 [IU] via SUBCUTANEOUS
  Administered 2023-11-08: 2 [IU] via SUBCUTANEOUS
  Administered 2023-11-09 (×2): 3 [IU] via SUBCUTANEOUS
  Administered 2023-11-10: 2 [IU] via SUBCUTANEOUS
  Administered 2023-11-10: 3 [IU] via SUBCUTANEOUS
  Administered 2023-11-10 – 2023-11-11 (×4): 2 [IU] via SUBCUTANEOUS
  Filled 2023-11-07 (×12): qty 1

## 2023-11-07 MED ORDER — DEXTROSE 50 % IV SOLN: 12.5000 g | INTRAVENOUS | Status: AC

## 2023-11-07 NOTE — Plan of Care (Signed)

## 2023-11-07 NOTE — Consult Note (Signed)
 Consultation Note Date: 11/07/2023 at 1030  Patient Name: [name]  DOB: [birth year]  MRN: [id number]  Age / Sex: 89 y.o., female  PCP: Rudolpho Norleen BIRCH, MD Referring Physician: Von Bellis, MD  HPI/Patient Profile: 89 y.o. female  with past medical history significant for dementia, DM II, CVA with left sided hemiparesis, HTN and HLD. Patient presented to ED from wound care clinin 11/06/2023 with concern for left foot ulceration and with inadequate blood flow.  Due to advanced dementia, patient was unable to provide history.   EDP exam found worsening L heel ulceration with darkening of the foot and no palpable pulses concerning for ischemic limb.   Pt ED labs not concerning but started on IV abx and heparin . ED vitals 184/78, HR 82, RR 17, SpO2 100% RA and 98.35F.   TRH consulted for admission and management of critical limb ischemia and chronic left lower extremity wound.   Vascular consulted for further evaluation. Patient's DSS legal guardian was present for this consult and consented to LLE angiogram with possible intervention for PAD with gangrene. Patient underwent angioplasty, stent placement x2 and mechanical thrombectomy under conscious sedation with successful recanalization of LLE for limb salvage.   Palliative care was consulted for assistance with goals of care conversations.   Clinical Assessment and Goals of Care: Extensive chart review completed prior to meeting patient including labs, vital signs, imaging, progress notes, orders, and available advanced directive documents from current and previous encounters. I then met with patient at the bedside and spoke with [name], DSS supervisor, to discuss diagnosis prognosis, GOC, EOL wishes, disposition and options.     Latest Ref Rng & Units 11/07/2023    2:14 PM 11/07/2023    2:56 AM 11/06/2023   10:37 AM  CBC  WBC 4.0 - 10.5 K/uL   8.5  9.4   Hemoglobin 12.0 - 15.0 g/dL 8.2  7.8  9.6   Hematocrit 36.0 - 46.0 % 26.3  24.6  31.8   Platelets 150 - 400 K/uL  252  134       Latest Ref Rng & Units 11/07/2023    2:56 AM 11/06/2023   10:37 AM 05/03/2023    5:58 AM  CMP  Glucose 70 - 99 mg/dL 878  851    BUN 8 - 23 mg/dL 18  21    Creatinine 9.55 - 1.00 mg/dL 9.44  9.45  9.45   Sodium 135 - 145 mmol/L 141  141    Potassium 3.5 - 5.1 mmol/L 3.6  4.3    Chloride 98 - 111 mmol/L 111  104    CO2 22 - 32 mmol/L 24  24    Calcium  8.9 - 10.3 mg/dL 8.1  8.9    Total Protein 6.5 - 8.1 g/dL 5.5  6.7    Total Bilirubin 0.0 - 1.2 mg/dL 0.6  0.8    Alkaline Phos 38 - 126 U/L 54  67    AST 15 - 41 U/L 20  30    ALT  0 - 44 U/L 10  11       Ill-appearing, elderly female resting in bed. She is alert to self only. She is able to engage in conversation, but exhibits confusion in reference to time, place and situation. Respirations are even and unlabored. She is in no distress.   States she feels ok today. She denies pain, CP or SOB. Denies appetite but currently drinking Glucerna shake. Preparing to eat breakfast during visit. Pt shares multiple times that she wants to go out and dance and have a good time.   I introduced Palliative Medicine as specialized medical care for people living with serious illness. It focuses on providing relief from the symptoms and stress of a serious illness. The goal is to improve quality of life for both the patient and the family.  Due to dementia, patient currently resides at [facility]. Keina Davis and [name] are appointed legal guardians for patient from DSS.   Sari and I discussed patient's current illness and what it means in the larger context of patient's on-going co-morbidities.  Natural disease trajectory and expectations at EOL were discussed.  The difference between aggressive medical intervention and comfort care was considered in light of the patient's goals of care.   Advance  directives, concepts specific to code status, artificial feeding and hydration, and rehospitalization were considered and discussed. Sari shares that when patient was previously hospitalized, they completed DNR at that time. She confirms that patient is to not have CPR or mechanical ventilation.  DNR completed and placed into physical chart.   Education offered regarding concept specific to human mortality and the limitations of medical interventions to prolong life when the body begins to fail to thrive.  Discussed with patient/family the importance of continued conversation with family and the medical providers regarding overall plan of care and treatment options, ensuring decisions are within the context of the patient's values and GOCs.  Plan for patient is to return to her facility at discharge.   Questions and concerns were addressed. The family was encouraged to call with questions or concerns.   Primary Decision Maker LEGAL GUARDIAN Rubie Moats, DSS [name], Legal guardian, DSS  Physical Exam Vitals reviewed.  Constitutional:      General: She is not in acute distress.    Appearance: She is ill-appearing.     Comments: Frail  HENT:     Head: Normocephalic and atraumatic.     Mouth/Throat:     Mouth: Mucous membranes are moist.  Pulmonary:     Effort: Pulmonary effort is normal. No respiratory distress.  Musculoskeletal:     Right lower leg: No edema.     Left lower leg: No edema.     Comments: Previous 1st, 5th L toe amputation  LL extremity warm to touch, no palpable pulses  Wound to L second toe Unna boot in place  Skin:    General: Skin is warm and dry.  Neurological:     Mental Status: She is alert. She is disoriented.  Psychiatric:        Mood and Affect: Mood normal.        Behavior: Behavior normal.    Recommendations/Plan: DNR/DNI- Uploaded to Epic via Vynca, signed DNR placed in paper chart Continue current supportive interventions Plan to d/c back to  facility when medically stable   Palliative Assessment/Data: 20-30%   Discussed plan of care with Dr. Von and primary nursing staff  Thank you for this consult. Palliative medicine will  continue to follow and assist holistically.   Time Total: 90 minutes  Time spent includes: Detailed review of medical records (labs, imaging, vital signs), medically appropriate exam (mental status, respiratory, cardiac, skin), discussed with treatment team, counseling and educating patient, family and staff, documenting clinical information, medication management and coordination of care.     Devere Sacks, ELNITA- [facility] Palliative Medicine Team  11/07/2023 2:54 PM  Office [phone]  Pager [phone]     Please contact Palliative Medicine Team providers via AMION for questions and concerns.

## 2023-11-07 NOTE — Progress Notes (Signed)
 [facility] Progress Note  Patient: [name]    FMW:[id number]  DOA: 11/06/2023     Date of Service: the patient was seen and examined on 11/07/2023  Chief Complaint  Patient presents with   Wound Check   Brief hospital course: Brande G Burback is a 89 y.o. female with medical history significant of hypertension, hyperlipidemia, diabetes, carotid artery disease, PAD, CVA with left hemiparesis, dementia, hyperaldosteronism, chronic wound presenting with worsening wound changes and left 2nd toe swelling and ischemic changes   ED Course: Vital signs in the ED notable for temperature 96-98 F, heart rate in the 50s-80s, blood pressure in the 160s-210 systolic.   Lab workup included CMP with glucose 148, albumin 3.0.  CBC with hemoglobin stable at 9.6, platelets 134.  PT, PTT, INR normal.  Blood cultures pending.   Patient received heparin  and vancomycin  in the ED.  Vascular surgery was consulted and took patient for left lower extremity angiography.  Patient on floor post procedure.   Assessment and Plan:  # PAD # Critical left lower extremity ischemia # Chronic lower extremity wound > Patient presented from wound care with concerns about blood flow due to discoloration of left second toe.  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 Vascular surgery consulted s/p angioplasty left lower extremity done on 9/26 > Received Vancomycin  in the ED. WBC normal. - Monitoring on telemetry - Hold off on further antibiotics for now - WOC consult - Follow-up blood cultures, trend fever curve and WBC - Continue with heparin  - Continue aspirin ,  Held Plavix  as per vascular surgery recommended - Continue home atorvastatin     # Acute blood loss anemia, post-op Monitor H&H  # Hypertension - Continue home lisinopril    Hyperlipidemia. - Continue home atorvastatin    Diabetes:  HbA1c 5.9 prediabetic range - SSI   Carotid artery disease - Continue atorvastatin  - Heparin ,  ASA, Plavix  as above   Dementia - Pleasantly demented - DSS guardian    # History of CVA > Left hemiparesis . - Continue atorvastatin  - Heparin  gtt and  ASA as above Held Plavix  for now    Body mass index is 25.96 kg/m.  Interventions:   Diet: Carb modified diet DVT Prophylaxis: Heparin  GTT  Advance goals of care discussion: DNR-limited  Family Communication: family was not present at bedside, at the time of interview.  The pt provided permission to discuss medical plan with the family. Opportunity was given to ask question and all questions were answered satisfactorily.   Disposition:  Pt is from SNF, admitted with LLE ischemia s/p angioplasty, still has heparin  gtt., which precludes a safe discharge. Discharge to SNF, when stable and cleared by vascular surgery, may need few days to improve.  Subjective: No significant events overnight, patient was lying comfortably.  AO x 1 at baseline due to dementia Patient denied any complaints, no pain.  Physical Exam: General: NAD, lying comfortably Appear in no distress, affect appropriate Eyes: PERRLA ENT: Oral Mucosa Clear, moist  Neck: no JVD,  Cardiovascular: S1 and S2 Present, no Murmur,  Respiratory: good respiratory effort, Bilateral Air entry equal and Decreased, no Crackles, no wheezes Abdomen: Bowel Sound present, Soft and no tenderness,  Skin: no rashes Extremities: left 2nd toe swelling and discoloration, mild erythema.  S/p prior amputation of left 1st and 5th toe. RLE no Pedal edema, no calf tenderness Neurologic: without any new focal findings Gait not checked due to patient safety concerns  Vitals:   11/06/23 2030 11/07/23  0448 11/07/23 0730 11/07/23 1616  BP: 139/65 (!) 139/49 (!) 138/58 137/61  Pulse: 93 81 77 80  Resp: 16 18 17 16   Temp: 98.1 F (36.7 C) 98.3 F (36.8 C) 97.7 F (36.5 C) 98.4 F (36.9 C)  TempSrc: Oral Oral  Oral  SpO2: 99% 99% 91% 99%  Weight:      Height:         Intake/Output Summary (Last 24 hours) at 11/07/2023 1625 Last data filed at 11/07/2023 1300 Gross per 24 hour  Intake 1231.85 ml  Output 300 ml  Net 931.85 ml   Filed Weights   11/06/23 1032  Weight: 68.6 kg    Data Reviewed: I have personally reviewed and interpreted daily labs, tele strips, imagings as discussed above. I reviewed all nursing notes, pharmacy notes, vitals, pertinent old records I have discussed plan of care as described above with RN and patient/family.  CBC: Recent Labs  Lab 11/06/23 1037 11/07/23 0256 11/07/23 1414  WBC 9.4 8.5  --   HGB 9.6* 7.8* 8.2*  HCT 31.8* 24.6* 26.3*  MCV 100.0 96.9  --   PLT 134* 252  --    Basic Metabolic Panel: Recent Labs  Lab 11/06/23 1037 11/07/23 0256  NA 141 141  K 4.3 3.6  CL 104 111  CO2 24 24  GLUCOSE 148* 121*  BUN 21 18  CREATININE 0.54 0.55  CALCIUM  8.9 8.1*    Studies: No results found.  Scheduled Meds:  atorvastatin   80 mg Oral QHS   clopidogrel   75 mg Oral Daily   dextrose   12.5 g Intravenous STAT   feeding supplement (GLUCERNA SHAKE)  237 mL Oral TID BM   insulin  aspart  0-15 Units Subcutaneous TID WC   lisinopril   20 mg Oral Daily   sodium chloride  flush  3 mL Intravenous Q12H   Continuous Infusions:  sodium chloride  75 mL/hr at 11/06/23 2051   heparin  700 Units/hr (11/07/23 1240)   vancomycin      PRN Meds: acetaminophen  **OR** acetaminophen , diphenhydrAMINE , famotidine , hydrALAZINE  **OR** hydrALAZINE , methylPREDNISolone  (SOLU-MEDROL ) injection, midazolam , polyethylene glycol  Time spent: 55 minutes  Author: ELVAN SOR. MD [facility] 11/07/2023 4:25 PM  To reach On-call, see care teams to locate the attending and reach out to them via [email or web address]. If 7PM-7AM, please contact night-coverage If you still have difficulty reaching the attending provider, please page the [facility] (Director on Call) for [facility] on amion for assistance.

## 2023-11-07 NOTE — Evaluation (Signed)
 Occupational Therapy Evaluation Patient Details Name: [name] MRN: [id number] DOB: 24-Aug-1933 Today's Date: 11/07/2023   History of Present Illness   [name] is a 89 y.o. female with medical history significant of hypertension, hyperlipidemia, diabetes, carotid artery disease, PAD, CVA with left hemiparesis, dementia, hyperaldosteronism, chronic wound presenting with worsening wound changes and toe changes.     Clinical Impressions Pt was seen for OT evaluation this date. Pt was alert and oriented to self only. Prior to hospital admission, pt was living at LTC at [facility]. PLOF obtained from legal guardian Sari. She reports the pt has been non ambulitory for multiple years, reports facility staff use lift or pick her up to place her in wheelchair, dependent for all ADLs/IADLs. Baseline LUE/UE hemiplegia remains, pt is able to hold bed rails during bed mobility. Pt attempted to sit on the EOB with TOTALA, unable to achieve seated position due to L knee contracted in flexion and discomfort with attempted mobility. Pt requires MINAs for prepping her tray to eat, pt is able to bring her food to her mouth. TOTAL +2 boost pt up in the bed. Will complete OT orders at this time as pt is at her functional baseline per indepth conversation with guardian. No skilled OT needs identified. Will sign off. Please re-consult if additional needs arise.     If plan is discharge home, recommend the following:   Two people to help with walking and/or transfers;A lot of help with bathing/dressing/bathroom;Direct supervision/assist for medications management;Direct supervision/assist for financial management;Assistance with cooking/housework;Assist for transportation;Supervision due to cognitive status;Help with stairs or ramp for entrance     Functional Status Assessment   Patient has not had a recent decline in their functional status     Equipment Recommendations   None recommended  by OT     Recommendations for Other Services         Precautions/Restrictions   Precautions Precautions: Fall Recall of Precautions/Restrictions: Intact Restrictions Weight Bearing Restrictions Per Provider Order: No     Mobility Bed Mobility Overal bed mobility: Needs Assistance Bed Mobility: Sidelying to Sit, Sit to Sidelying   Sidelying to sit: Total assist, HOB elevated     Sit to sidelying: Total assist, HOB elevated General bed mobility comments: Pt able to use RUE to grip railings in attempt to sit on the EOB.    Transfers                   General transfer comment: unsafe to attempt      Balance                                           ADL either performed or assessed with clinical judgement   ADL Overall ADL's : At baseline                                       General ADL Comments: Pt able to bring her food to her mouth with assistance for cutting up and opening containers.     Vision Baseline Vision/History: 1 Wears glasses                         Pertinent Vitals/Pain Pain Assessment Pain Assessment: No/denies pain  Extremity/Trunk Assessment Upper Extremity Assessment Upper Extremity Assessment: LUE deficits/detail LUE Deficits / Details: Chronic L-sided hemiparesis LUE Coordination: decreased fine motor;decreased gross motor   Lower Extremity Assessment Lower Extremity Assessment: Defer to PT evaluation;LLE deficits/detail LLE Deficits / Details: Obvious knee flexion contracture       Communication Communication Communication: No apparent difficulties   Cognition Arousal: Alert Behavior During Therapy: WFL for tasks assessed/performed Cognition: History of cognitive impairments, No family/caregiver present to determine baseline             OT - Cognition Comments: A/Ox1 self only                 Following commands: Impaired Following commands impaired: Follows  one step commands with increased time     Cueing  General Comments   Cueing Techniques: Verbal cues;Tactile cues  Pt repositioned to prevent further contractures, often lays on her L side.   Exercises Exercises: Other exercises Other Exercises Other Exercises: Edu: Role of OT eval, safe ADL completion   Shoulder Instructions      Home Living Family/patient expects to be discharged to:: Skilled nursing facility                                 Additional Comments: From [facility]      Prior Functioning/Environment Prior Level of Function : Needs assist             Mobility Comments: Non ambulitory for multiple years per her guardian, reports facility staff use lift or pick her up to place her in wheelchair. ADLs Comments: Able to feed herself after her food has been cut up and brought close to her. Dependent for asssitance for all ADL/IADLs at baseline    OT Problem List:     OT Treatment/Interventions:        OT Goals(Current goals can be found in the care plan section)   Acute Rehab OT Goals OT Goal Formulation: Patient unable to participate in goal setting Time For Goal Achievement: 11/21/23 Potential to Achieve Goals: Fair   OT Frequency:       Co-evaluation              AM-PAC OT 6 Clicks Daily Activity     Outcome Measure Help from another person eating meals?: A Lot Help from another person taking care of personal grooming?: A Lot Help from another person toileting, which includes using toliet, bedpan, or urinal?: Total Help from another person bathing (including washing, rinsing, drying)?: Total Help from another person to put on and taking off regular upper body clothing?: A Lot Help from another person to put on and taking off regular lower body clothing?: Total 6 Click Score: 9   End of Session    Activity Tolerance: Patient tolerated treatment well Patient left: in bed;with call bell/phone within reach;with bed alarm  set                   Time: 1057-1120 OT Time Calculation (min): 23 min Charges:  OT General Charges $OT Visit: 1 Visit OT Evaluation $OT Eval Moderate Complexity: 1 Mod OT Treatments $Self Care/Home Management : 8-22 mins  Larraine Colas M.S. OTR/L  11/07/23, 1:13 PM

## 2023-11-07 NOTE — Consult Note (Signed)
 PHARMACY - ANTICOAGULATION CONSULT NOTE  Pharmacy Consult for Heparin  Indication: LLE ischemia  Allergies  Allergen Reactions   Metformin  And Related Nausea And Vomiting    Patient Measurements: Height: 5' 4 (162.6 cm) Weight: 68.6 kg (151 lb 3.8 oz) IBW/kg (Calculated) : 54.7 HEPARIN  DW (KG): 68.4  Vital Signs: Temp: 98.4 F (36.9 C) (09/27 2045) Temp Source: Oral (09/27 2045) BP: 143/120 (09/27 2045) Pulse Rate: 88 (09/27 2045)  Labs: Recent Labs    11/06/23 1037 11/06/23 1126 11/06/23 2214 11/07/23 0256 11/07/23 0942 11/07/23 1414 11/07/23 2030  HGB 9.6*  --   --  7.8*  --  8.2*  --   HCT 31.8*  --   --  24.6*  --  26.3*  --   PLT 134*  --   --  252  --   --   --   APTT  --  36 47*  --   --   --   --   LABPROT  --  14.8  --   --   --   --   --   INR  --  1.1  --   --   --   --   --   HEPARINUNFRC  --   --  <0.10*  --  <0.10*  --  0.22*  CREATININE 0.54  --   --  0.55  --   --   --     Estimated Creatinine Clearance: 47.2 mL/min (by C-G formula based on SCr of 0.55 mg/dL).   Medical History: Past Medical History:  Diagnosis Date   Carpal tunnel syndrome on both sides    CVA (cerebral vascular accident) (HCC) 11/14/2019   Diabetes (HCC)    Diabetic polyneuropathy (HCC)    Diverticulitis    Hyperlipidemia    Hypertension    Osteoarthritis    Sepsis (HCC) 03/22/2018   Suspected UTI 04/09/2023   UTI (urinary tract infection) 09/20/2019   Vaginal prolapse     Medications:  Plavix  75mg  daily. Last dose unknown. No other antiplatelets/anticoagulants on file.  Assessment: 49 YOF with history of T2DM, PVD, and dementia. Presents to ED w/ LLE ischemia. Pharmacy has been consulted to dose continuous heparin  infusion. Baseline labs pending.  9/26 @ 2214:  aPTT = 47,  HL = < 0.1   ( RN called to say heparin  gtt had been turned off d/t bleeding @ 2025) 9/27 0034- restarted drip  9/27 0942 HL < 0.10,  subtherapeutic 9/27 2030 HL 0.22, SUBtherapeutic  Goal  of Therapy:  Heparin  level 0.3-0.7 units/ml Monitor platelets by anticoagulation protocol: Yes   Plan:  Per vascular note 9/27 am: POD #1 s/p LEFT lower extremity revascularization- angioplasty/thrombectomy. Bleeding from right groin access site last night. Resume Heparin  gtt until discharge then ASA/Plavix   9/27 2030 HL  0.22,  subtherapeutic -given recent bleeding will not order bolus - increase heparin  drip to 800 units/hr - recheck HL 8 hrs after rate change - CBC daily  Olam Fritter, PharmD, BCPS 11/07/2023 9:34 PM

## 2023-11-07 NOTE — Progress Notes (Signed)
 1 Day Post-Op   Subjective/Chief Complaint: No further bleeding overnight.    Objective: Vital signs in last 24 hours: Temp:  [96.6 F (35.9 C)-98.3 F (36.8 C)] 98.3 F (36.8 C) (09/27 0448) Pulse Rate:  [58-93] 81 (09/27 0448) Resp:  [10-18] 18 (09/27 0448) BP: (139-210)/(49-93) 139/49 (09/27 0448) SpO2:  [92 %-100 %] 99 % (09/27 0448) Weight:  [68.6 kg] 68.6 kg (09/26 1032) Last BM Date :  (pta)  Intake/Output from previous day: 09/26 0701 - 09/27 0700 In: -  Out: 450 [Urine:300; Blood:150] Intake/Output this shift: Total I/O In: -  Out: 300 [Urine:300]  General appearance: no distress Extremities: RIGHT groin soft, no hematoma, no bleeding, leg/foot warm; LEFT foot warm  Lab Results:  Recent Labs    11/06/23 1037 11/07/23 0256  WBC 9.4 8.5  HGB 9.6* 7.8*  HCT 31.8* 24.6*  PLT 134* 252   BMET Recent Labs    11/06/23 1037 11/07/23 0256  NA 141 141  K 4.3 3.6  CL 104 111  CO2 24 24  GLUCOSE 148* 121*  BUN 21 18  CREATININE 0.54 0.55  CALCIUM  8.9 8.1*   PT/INR Recent Labs    11/06/23 1126  LABPROT 14.8  INR 1.1   ABG No results for input(s): PHART, HCO3 in the last 72 hours.  Invalid input(s): PCO2, PO2  Studies/Results: PERIPHERAL VASCULAR CATHETERIZATION Result Date: 11/06/2023 See surgical note for result.   Anti-infectives: Anti-infectives (From admission, onward)    Start     Dose/Rate Route Frequency Ordered Stop   11/06/23 1227  ceFAZolin  (ANCEF ) IVPB 2g/100 mL premix        2 g 200 mL/hr over 30 Minutes Intravenous 30 min pre-op 11/06/23 1227 11/06/23 1447   11/06/23 1100  vancomycin  (VANCOCIN ) IVPB 1000 mg/200 mL premix        1,000 mg 200 mL/hr over 60 Minutes Intravenous  Once 11/06/23 1045         Assessment/Plan: s/p Procedure(s): Lower Extremity Angiography (Left) POD #1 s/p LEFT lower extremity revascularization- angioplasty/thrombectomy Bleeding from right groin access site  Resume Heparin  gtt until  discharge then ASA/Plavix  Monitor HgB- hemodynamically stable now. Trend down since yesterday  LOS: 1 day    Tisa Dakin A 11/07/2023

## 2023-11-07 NOTE — Consult Note (Addendum)
 PHARMACY - ANTICOAGULATION CONSULT NOTE  Pharmacy Consult for Heparin  Indication: LLE ischemia  Allergies  Allergen Reactions   Metformin  And Related Nausea And Vomiting    Patient Measurements: Height: 5' 4 (162.6 cm) Weight: 68.6 kg (151 lb 3.8 oz) IBW/kg (Calculated) : 54.7 HEPARIN  DW (KG): 68.4  Vital Signs: Temp: 98.1 F (36.7 C) (09/26 2030) Temp Source: Oral (09/26 2030) BP: 139/65 (09/26 2030) Pulse Rate: 93 (09/26 2030)  Labs: Recent Labs    11/06/23 1037 11/06/23 1126 11/06/23 2214  HGB 9.6*  --   --   HCT 31.8*  --   --   PLT 134*  --   --   APTT  --  36 47*  LABPROT  --  14.8  --   INR  --  1.1  --   HEPARINUNFRC  --   --  <0.10*  CREATININE 0.54  --   --     Estimated Creatinine Clearance: 47.2 mL/min (by C-G formula based on SCr of 0.54 mg/dL).   Medical History: Past Medical History:  Diagnosis Date   Carpal tunnel syndrome on both sides    CVA (cerebral vascular accident) (HCC) 11/14/2019   Diabetes (HCC)    Diabetic polyneuropathy (HCC)    Diverticulitis    Hyperlipidemia    Hypertension    Osteoarthritis    Sepsis (HCC) 03/22/2018   Suspected UTI 04/09/2023   UTI (urinary tract infection) 09/20/2019   Vaginal prolapse     Medications:  Plavix  75mg  daily. Last dose unknown. No other antiplatelets/anticoagulants on file.  Assessment: 33 YOF with history of T2DM, PVD, and dementia. Presents to ED w/ LLE ischemia. Pharmacy has been consulted to dose continuous heparin  infusion. Baseline labs pending.  Goal of Therapy:  Heparin  level 0.3-0.7 units/ml Monitor platelets by anticoagulation protocol: Yes   Plan:  9/26:  RN called to say heparin  gtt had been turned off d/t bleeding @ 2025,   MD requesting to recheck HL, aPTT before resuming heparin .  - restart heparin  at 500 units/hr @ 0000 if HL, aPTT ok - STAT HL and aPTT ordered for 2200   9/26 @ 2214:  aPTT = 47,  HL = < 0.1 - heparin  drip restarted at 500 units/hr - recheck HL  8 hrs after restart  - CBC daily  9/27:  MD states pt no longer bleeding and OK to follow heparin  nomogram from here on  Caedin Mogan D 11/07/2023,12:33 AM

## 2023-11-07 NOTE — Consult Note (Signed)
 PHARMACY - ANTICOAGULATION CONSULT NOTE  Pharmacy Consult for Heparin  Indication: LLE ischemia  Allergies  Allergen Reactions   Metformin  And Related Nausea And Vomiting    Patient Measurements: Height: 5' 4 (162.6 cm) Weight: 68.6 kg (151 lb 3.8 oz) IBW/kg (Calculated) : 54.7 HEPARIN  DW (KG): 68.4  Vital Signs: Temp: 97.7 F (36.5 C) (09/27 0730) Temp Source: Oral (09/27 0448) BP: 138/58 (09/27 0730) Pulse Rate: 77 (09/27 0730)  Labs: Recent Labs    11/06/23 1037 11/06/23 1126 11/06/23 2214 11/07/23 0256 11/07/23 0942  HGB 9.6*  --   --  7.8*  --   HCT 31.8*  --   --  24.6*  --   PLT 134*  --   --  252  --   APTT  --  36 47*  --   --   LABPROT  --  14.8  --   --   --   INR  --  1.1  --   --   --   HEPARINUNFRC  --   --  <0.10*  --  <0.10*  CREATININE 0.54  --   --  0.55  --     Estimated Creatinine Clearance: 47.2 mL/min (by C-G formula based on SCr of 0.55 mg/dL).   Medical History: Past Medical History:  Diagnosis Date   Carpal tunnel syndrome on both sides    CVA (cerebral vascular accident) (HCC) 11/14/2019   Diabetes (HCC)    Diabetic polyneuropathy (HCC)    Diverticulitis    Hyperlipidemia    Hypertension    Osteoarthritis    Sepsis (HCC) 03/22/2018   Suspected UTI 04/09/2023   UTI (urinary tract infection) 09/20/2019   Vaginal prolapse     Medications:  Plavix  75mg  daily. Last dose unknown. No other antiplatelets/anticoagulants on file.  Assessment: 50 YOF with history of T2DM, PVD, and dementia. Presents to ED w/ LLE ischemia. Pharmacy has been consulted to dose continuous heparin  infusion. Baseline labs pending.  9/26 @ 2214:  aPTT = 47,  HL = < 0.1   ( RN called to say heparin  gtt had been turned off d/t bleeding @ 2025) 9/27 0034- restarted drip  9/27 0942 HL < 0.10,  subtherapeutic  Goal of Therapy:  Heparin  level 0.3-0.7 units/ml Monitor platelets by anticoagulation protocol: Yes   Plan:  Per vascular note 9/27 am: POD #1 s/p  LEFT lower extremity revascularization- angioplasty/thrombectomy. Bleeding from right groin access site last night. Resume Heparin  gtt until discharge then ASA/Plavix   9/27 0942 HL < 0.10,  subtherapeutic -given recent bleeding will not order bolus - increase heparin  drip to 700 units/hr - recheck HL 8 hrs after rate change - CBC daily  Allean Haas PharmD Clinical Pharmacist 11/07/2023

## 2023-11-07 NOTE — Plan of Care (Signed)

## 2023-11-07 NOTE — Progress Notes (Signed)
 This RN was called into the room to access bleeding at right angiography site. Upon assessment patient noted to have significant bleeding at the site, with going being soiled with blood. On call vascular provider called. New dressing applied and pressure held at the site for 20 minutes. Heparin  drip stopped per Vascular provider order.

## 2023-11-08 ENCOUNTER — Inpatient Hospital Stay

## 2023-11-08 DIAGNOSIS — Z789 Other specified health status: Secondary | ICD-10-CM | POA: Diagnosis not present

## 2023-11-08 DIAGNOSIS — L97509 Non-pressure chronic ulcer of other part of unspecified foot with unspecified severity: Secondary | ICD-10-CM | POA: Diagnosis not present

## 2023-11-08 DIAGNOSIS — Z66 Do not resuscitate: Secondary | ICD-10-CM | POA: Diagnosis not present

## 2023-11-08 DIAGNOSIS — Z515 Encounter for palliative care: Secondary | ICD-10-CM | POA: Diagnosis not present

## 2023-11-08 DIAGNOSIS — I7025 Atherosclerosis of native arteries of other extremities with ulceration: Secondary | ICD-10-CM | POA: Diagnosis not present

## 2023-11-08 DIAGNOSIS — F0390 Unspecified dementia without behavioral disturbance: Secondary | ICD-10-CM

## 2023-11-08 LAB — BASIC METABOLIC PANEL WITH GFR
Anion gap: 11 (ref 5–15)
BUN: 20 mg/dL (ref 8–23)
CO2: 22 mmol/L (ref 22–32)
Calcium: 8.3 mg/dL — ABNORMAL LOW (ref 8.9–10.3)
Chloride: 110 mmol/L (ref 98–111)
Creatinine, Ser: 0.44 mg/dL (ref 0.44–1.00)
GFR, Estimated: >60 mL/min (ref >60)
Glucose, Bld: 147 mg/dL — ABNORMAL HIGH (ref 70–99)
Potassium: 3.7 mmol/L (ref 3.5–5.1)
Sodium: 143 mmol/L (ref 135–145)

## 2023-11-08 LAB — GLUCOSE, CAPILLARY
Glucose-Capillary: 128 mg/dL — ABNORMAL HIGH (ref 70–99)
Glucose-Capillary: 162 mg/dL — ABNORMAL HIGH (ref 70–99)
Glucose-Capillary: 200 mg/dL — ABNORMAL HIGH (ref 70–99)
Glucose-Capillary: 138 mg/dL — ABNORMAL HIGH (ref 70–99)

## 2023-11-08 LAB — CBC
HCT: 22.2 % — ABNORMAL LOW (ref 36.0–46.0)
Hemoglobin: 6.9 g/dL — ABNORMAL LOW (ref 12.0–15.0)
MCH: 30.1 pg (ref 26.0–34.0)
MCHC: 31.1 g/dL (ref 30.0–36.0)
MCV: 96.9 fL (ref 80.0–100.0)
Platelets: 242 K/uL (ref 150–400)
RBC: 2.29 MIL/uL — ABNORMAL LOW (ref 3.87–5.11)
RDW: 13.2 % (ref 11.5–15.5)
WBC: 11 K/uL — ABNORMAL HIGH (ref 4.0–10.5)
nRBC: 0 % (ref 0.0–0.2)

## 2023-11-08 LAB — HEMOGLOBIN AND HEMATOCRIT, BLOOD
HCT: 26 % — ABNORMAL LOW (ref 36.0–46.0)
Hemoglobin: 8.6 g/dL — ABNORMAL LOW (ref 12.0–15.0)

## 2023-11-08 LAB — HEPARIN LEVEL (UNFRACTIONATED)
Heparin Unfractionated: 0.39 [IU]/mL (ref 0.30–0.70)
Heparin Unfractionated: 0.22 [IU]/mL — ABNORMAL LOW (ref 0.30–0.70)

## 2023-11-08 LAB — PREPARE RBC (CROSSMATCH)

## 2023-11-08 LAB — PHOSPHORUS: Phosphorus: 2.1 mg/dL — ABNORMAL LOW (ref 2.5–4.6)

## 2023-11-08 LAB — MAGNESIUM: Magnesium: 1.9 mg/dL (ref 1.7–2.4)

## 2023-11-08 MED ORDER — DEXTROSE 5 % IV SOLN
30.0000 mmol | Freq: Once | INTRAVENOUS | Status: AC
  Administered 2023-11-08: 30 mmol via INTRAVENOUS
  Filled 2023-11-08: qty 10

## 2023-11-08 MED ORDER — COLLAGENASE 250 UNIT/GM EX OINT
TOPICAL_OINTMENT | Freq: Every day | CUTANEOUS | Status: DC
  Filled 2023-11-08: qty 30

## 2023-11-08 MED ORDER — SODIUM CHLORIDE 0.9% IV SOLUTION: Freq: Once | INTRAVENOUS | Status: AC

## 2023-11-08 NOTE — Plan of Care (Signed)

## 2023-11-08 NOTE — Consult Note (Signed)
 PHARMACY - ANTICOAGULATION CONSULT NOTE  Pharmacy Consult for Heparin  Indication: LLE ischemia  Allergies  Allergen Reactions   Metformin  And Related Nausea And Vomiting    Patient Measurements: Height: 5' 4 (162.6 cm) Weight: 68.6 kg (151 lb 3.8 oz) IBW/kg (Calculated) : 54.7 HEPARIN  DW (KG): 68.4  Vital Signs: Temp: 98.2 F (36.8 C) (09/28 0354) BP: 98/60 (09/28 0354) Pulse Rate: 79 (09/28 0354)  Labs: Recent Labs    11/06/23 1037 11/06/23 1126 11/06/23 2214 11/07/23 0256 11/07/23 0942 11/07/23 1414 11/07/23 2030 11/08/23 0601 11/08/23 1352  HGB 9.6*  --   --  7.8*  --  8.2*  --  6.9*  --   HCT 31.8*  --   --  24.6*  --  26.3*  --  22.2*  --   PLT 134*  --   --  252  --   --   --  242  --   APTT  --  36 47*  --   --   --   --   --   --   LABPROT  --  14.8  --   --   --   --   --   --   --   INR  --  1.1  --   --   --   --   --   --   --   HEPARINUNFRC  --   --  <0.10*  --    < >  --  0.22* 0.39 0.22*  CREATININE 0.54  --   --  0.55  --   --   --  0.44  --    < > = values in this interval not displayed.    Estimated Creatinine Clearance: 47.2 mL/min (by C-G formula based on SCr of 0.44 mg/dL).   Medical History: Past Medical History:  Diagnosis Date   Carpal tunnel syndrome on both sides    CVA (cerebral vascular accident) (HCC) 11/14/2019   Diabetes (HCC)    Diabetic polyneuropathy (HCC)    Diverticulitis    Hyperlipidemia    Hypertension    Osteoarthritis    Sepsis (HCC) 03/22/2018   Suspected UTI 04/09/2023   UTI (urinary tract infection) 09/20/2019   Vaginal prolapse     Medications:  Plavix  75mg  daily. Last dose unknown. No other antiplatelets/anticoagulants on file.  Assessment: 54 YOF with history of T2DM, PVD, and dementia. Presents to ED w/ LLE ischemia. Pharmacy has been consulted to dose continuous heparin  infusion. Baseline labs pending.  9/26 @ 2214:  aPTT = 47,  HL = < 0.1   ( RN called to say heparin  gtt had been turned off d/t  bleeding @ 2025) 9/27 0034- restarted drip  9/27 0942 HL < 0.10,  subtherapeutic 9/27 2030 HL 0.22, SUBtherapeutic 9/28 0601 HL 0.39, Therapeutic X 1  9/28 1352 HL 0.22, SUBtherapeutic  (note heparin  drip was paused ~1300 per vascular MD for Hgb 6.9)  Goal of Therapy:  Heparin  level 0.3-0.7 units/ml Monitor platelets by anticoagulation protocol: Yes   Plan:  Per vascular note 9/27 am: POD #1 s/p LEFT lower extremity revascularization- angioplasty/thrombectomy. Bleeding from right groin access site last night 9/26. Resume Heparin  gtt until discharge then ASA/Plavix    9/28: Heparin  drip paused ~1300 and patient received pRBC -Hgb 6.9  Plt 242  Per Vascular MD note 11/08/23:  No events overnight. No further bleeding. However, HgB now 6.9.  Plan: Hold Heparin  gtt. Transfuse pRBC.  Right Femoral artery duplex  reviewed- No evidence of hemorrhage, hematoma or pseudoaneurysm  -rechecking Hgb to determine when to restart heparin  drip - CBC daily  Allean Haas PharmD Clinical Pharmacist 11/08/2023

## 2023-11-08 NOTE — Consult Note (Signed)
 PHARMACY - ANTICOAGULATION CONSULT NOTE  Pharmacy Consult for Heparin  Indication: LLE ischemia  Allergies  Allergen Reactions   Metformin  And Related Nausea And Vomiting    Patient Measurements: Height: 5' 4 (162.6 cm) Weight: 68.6 kg (151 lb 3.8 oz) IBW/kg (Calculated) : 54.7 HEPARIN  DW (KG): 68.4  Vital Signs: Temp: 98.1 F (36.7 C) (09/28 1942) Temp Source: Oral (09/28 1942) BP: 147/55 (09/28 1942) Pulse Rate: 79 (09/28 1942)  Labs: Recent Labs    11/06/23 1037 11/06/23 1126 11/06/23 2214 11/07/23 0256 11/07/23 0942 11/07/23 1414 11/07/23 2030 11/08/23 0601 11/08/23 1352 11/08/23 2007  HGB 9.6*  --   --  7.8*  --  8.2*  --  6.9*  --  8.6*  HCT 31.8*  --   --  24.6*  --  26.3*  --  22.2*  --  26.0*  PLT 134*  --   --  252  --   --   --  242  --   --   APTT  --  36 47*  --   --   --   --   --   --   --   LABPROT  --  14.8  --   --   --   --   --   --   --   --   INR  --  1.1  --   --   --   --   --   --   --   --   HEPARINUNFRC  --   --  <0.10*  --    < >  --  0.22* 0.39 0.22*  --   CREATININE 0.54  --   --  0.55  --   --   --  0.44  --   --    < > = values in this interval not displayed.    Estimated Creatinine Clearance: 47.2 mL/min (by C-G formula based on SCr of 0.44 mg/dL).   Medical History: Past Medical History:  Diagnosis Date   Carpal tunnel syndrome on both sides    CVA (cerebral vascular accident) (HCC) 11/14/2019   Diabetes (HCC)    Diabetic polyneuropathy (HCC)    Diverticulitis    Hyperlipidemia    Hypertension    Osteoarthritis    Sepsis (HCC) 03/22/2018   Suspected UTI 04/09/2023   UTI (urinary tract infection) 09/20/2019   Vaginal prolapse     Medications:  Plavix  75mg  daily. Last dose unknown. No other antiplatelets/anticoagulants on file.  Assessment: 20 YOF with history of T2DM, PVD, and dementia. Presents to ED w/ LLE ischemia. Pharmacy has been consulted to dose continuous heparin  infusion. Baseline labs pending.  9/26 @  2214:  aPTT = 47,  HL = < 0.1   ( RN called to say heparin  gtt had been turned off d/t bleeding @ 2025) 9/27 0034- restarted drip  9/27 0942 HL < 0.10,  subtherapeutic 9/27 2030 HL 0.22, SUBtherapeutic 9/28 0601 HL 0.39, Therapeutic X 1  9/28 1352 HL 0.22, SUBtherapeutic  (note heparin  drip was paused ~1300 per vascular MD for Hgb 6.9)  Goal of Therapy:  Heparin  level 0.3-0.7 units/ml Monitor platelets by anticoagulation protocol: Yes   Plan:  Per vascular note 9/27 am: POD #1 s/p LEFT lower extremity revascularization- angioplasty/thrombectomy. Bleeding from right groin access site last night 9/26. Resume Heparin  gtt until discharge then ASA/Plavix    9/28: Heparin  drip paused ~1300 and patient received pRBC -Hgb 6.9  Plt 242  Per  Vascular MD note 11/08/23:  No events overnight. No further bleeding. However, HgB now 6.9.  Plan: Hold Heparin  gtt. Transfuse pRBC.  Right Femoral artery duplex reviewed- No evidence of hemorrhage, hematoma or pseudoaneurysm  -rechecking Hgb to determine when to restart heparin  drip  9/28 Update: Per MD - to restart heparin  infusion without bolus. Heparin  infusion resumed at previous rate of 800 units/hr at ~1849. Will re-check heparin  level in ~8 hours. Continue to monitor CBC and for signs/symptoms of bleeding.   Alan Hoe, PharmD 11/08/2023 8:37 PM

## 2023-11-08 NOTE — Progress Notes (Signed)
 PT Cancellation Note  Patient Details Name: [name] MRN: [id number] DOB: May 05, 1933   Cancelled Treatment:    Reason Eval/Treat Not Completed: Patient at procedure or test/unavailable. Transport entering room as PT was speaking with pt. Pt needing to be transported for an ultrasound. PT will continue to f/u with pt acutely as available and appropriate.    [name] 11/08/2023, 10:34 AM

## 2023-11-08 NOTE — Progress Notes (Signed)
 Palliative Care Progress Note, Assessment & Plan   Patient Name: [name]       Date: 11/08/2023 DOB: March 20, 1933  Age: 89 y.o. MRN#: [id number] Attending Physician: Von Bellis, MD Primary Care Physician: Rudolpho Norleen BIRCH, MD Admit Date: 11/06/2023  Subjective: Endorses L leg, foot pain today. Reports eating a banana for breakfast. Slept well overnight. Denies CP/SOB.   HPI: 89 y.o. female  with past medical history significant for dementia, DM II, CVA with left sided hemiparesis, HTN and HLD. Patient presented to ED from wound care clinin 11/06/2023 with concern for left foot ulceration and with inadequate blood flow.  Due to advanced dementia, patient was unable to provide history.    EDP exam found worsening L heel ulceration with darkening of the foot and no palpable pulses concerning for ischemic limb.    Pt ED labs not concerning but started on IV abx and heparin . ED vitals 184/78, HR 82, RR 17, SpO2 100% RA and 98.73F.    TRH consulted for admission and management of critical limb ischemia and chronic left lower extremity wound.    Vascular consulted for further evaluation. Patient's DSS legal guardian was present for this consult and consented to LLE angiogram with possible intervention for PAD with gangrene. Patient underwent angioplasty, stent placement x2 and mechanical thrombectomy under conscious sedation with successful recanalization of LLE for limb salvage.    Palliative care was consulted for assistance with goals of care conversations.  Legal guardian, [name], DSS manages patients care.    Summary of counseling/coordination of care: Extensive chart review completed prior to meeting patient including labs, vital signs, imaging, progress notes, orders, and available  advanced directive documents from current and previous encounters.      Latest Ref Rng & Units 11/08/2023    6:01 AM 11/07/2023    2:14 PM 11/07/2023    2:56 AM  CBC  WBC 4.0 - 10.5 K/uL 11.0   8.5   Hemoglobin 12.0 - 15.0 g/dL 6.9  8.2  7.8   Hematocrit 36.0 - 46.0 % 22.2  26.3  24.6   Platelets 150 - 400 K/uL 242   252       Latest Ref Rng & Units 11/08/2023    6:01 AM 11/07/2023    2:56 AM 11/06/2023   10:37 AM  CMP  Glucose 70 - 99 mg/dL 852  878  851   BUN 8 - 23 mg/dL 20  18  21    Creatinine 0.44 - 1.00 mg/dL 9.55  9.44  9.45   Sodium 135 - 145 mmol/L 143  141  141   Potassium 3.5 - 5.1 mmol/L 3.7  3.6  4.3   Chloride 98 - 111 mmol/L 110  111  104   CO2 22 - 32 mmol/L 22  24  24    Calcium  8.9 - 10.3 mg/dL 8.3  8.1  8.9   Total Protein 6.5 - 8.1 g/dL  5.5  6.7   Total Bilirubin 0.0 - 1.2 mg/dL  0.6  0.8   Alkaline Phos 38 - 126 U/L  54  67   AST 15 - 41 U/L  20  30   ALT 0 - 44 U/L  10  11    Ill-appearing, pleasant female lying in bed. She is alert but only oriented to self. She appears more sleepy today than yesterday. She is able to engage in conversation, but exhibits confusion stating she works here and her car is outside. Respirations are even and unlabored. She is in no distress.     Physical Exam Vitals reviewed.  Constitutional:      General: She is not in acute distress.    Appearance: She is ill-appearing.  HENT:     Head: Normocephalic and atraumatic.     Mouth/Throat:     Mouth: Mucous membranes are dry.  Pulmonary:     Effort: Pulmonary effort is normal. No respiratory distress.  Musculoskeletal:     Right lower leg: No edema.     Left lower leg: No edema.     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  Neurological:     Mental Status: She is alert. She is disoriented.  Psychiatric:        Mood and Affect: Mood normal.         Behavior: Behavior normal.      Recommendations/Plan: DNR/DNI- Uploaded to Epic via Vynca, signed DNR placed in paper chart Continue current supportive interventions Plan to d/c back to facility when medically stable  Contact DSS legal guardian [name] or  [name] with questions, concerns regarding patient   Total Time 50 minutes   Time spent includes: Detailed review of medical records (labs, imaging, vital signs), medically appropriate exam (mental status, respiratory, cardiac, skin), discussed with treatment team, counseling and educating patient, family and staff, documenting clinical information, medication management and coordination of care.     Devere Sacks, AMANDA [facility] Palliative Medicine Team  11/08/2023 8:51 AM  Office [phone]  Pager [phone]

## 2023-11-08 NOTE — Progress Notes (Addendum)
 2 Days Post-Op   Subjective/Chief Complaint: No events overnight. No further bleeding. However, HgB now 6.9.    Objective: Vital signs in last 24 hours: Temp:  [98.2 F (36.8 C)-98.4 F (36.9 C)] 98.2 F (36.8 C) (09/28 0354) Pulse Rate:  [79-88] 79 (09/28 0354) Resp:  [16-19] 19 (09/28 0354) BP: (98-143)/(60-120) 98/60 (09/28 0354) SpO2:  [99 %-100 %] 100 % (09/28 0354) Last BM Date :  (pta)  Intake/Output from previous day: 09/27 0701 - 09/28 0700 In: 240 [P.O.:240] Out: -  Intake/Output this shift: No intake/output data recorded.  General appearance: alert and no distress Cardio: regular rate and rhythm Extremities: RIGHT groin- soft, no hematoma, no bleeding noted  Lab Results:  Recent Labs    11/07/23 0256 11/07/23 1414 11/08/23 0601  WBC 8.5  --  11.0*  HGB 7.8* 8.2* 6.9*  HCT 24.6* 26.3* 22.2*  PLT 252  --  242   BMET Recent Labs    11/07/23 0256 11/08/23 0601  NA 141 143  K 3.6 3.7  CL 111 110  CO2 24 22  GLUCOSE 121* 147*  BUN 18 20  CREATININE 0.55 0.44  CALCIUM  8.1* 8.3*   PT/INR Recent Labs    11/06/23 1126  LABPROT 14.8  INR 1.1   ABG No results for input(s): PHART, HCO3 in the last 72 hours.  Invalid input(s): PCO2, PO2  Studies/Results: PERIPHERAL VASCULAR CATHETERIZATION Result Date: 11/06/2023 See surgical note for result.   Anti-infectives: Anti-infectives (From admission, onward)    Start     Dose/Rate Route Frequency Ordered Stop   11/06/23 1227  ceFAZolin  (ANCEF ) IVPB 2g/100 mL premix        2 g 200 mL/hr over 30 Minutes Intravenous 30 min pre-op 11/06/23 1227 11/06/23 1447   11/06/23 1100  vancomycin  (VANCOCIN ) IVPB 1000 mg/200 mL premix        1,000 mg 200 mL/hr over 60 Minutes Intravenous  Once 11/06/23 1045         Assessment/Plan: s/p Procedure(s): Lower Extremity Angiography (Left) Hold Heparin  gtt Transfuse pRBC Obtain right groin duplex Will monitor   Addendum:  Right Femoral artery  duplex reviewed- No evidence of hemorrhage, hematoma or pseudoaneurysm.  LOS: 2 days    Tisa Curry LABOR 11/08/2023

## 2023-11-08 NOTE — Consult Note (Addendum)
 WOC Nurse Consult Note: patient is followed at [facility] for unstageable wound L heel and L 2nd digit; they are using Santyl to heel wound and Betadine  to 2nd digit.  Patient has undergone vascular intervention with angioplasty and stenting of L leg  Reason for Consult: wounds L foot and L heel  Wound type: 1. Unstageable Pressure Injury L heel in setting of PAD  black eschar  2. Full thickness wounds L 2nd digit with dry necrotic tissue  Pressure Injury POA: Yes  Measurement: see nursing flowsheet; per [facility] note 9/26 L heel 2 cm x 3.7 cm x 0.2 cm, L 2nd digit 1.8 cm x 1.5 cm x 0.1 cm  Wound bed: black necrotic tissue  Drainage (amount, consistency, odor) see nursing flowsheet  Periwound: Dressing procedure/placement/frequency:  Cleanse L heel wound with NS, Apply 1/4 thick layer of Santyl to wound bed, top with saline moist gauze, top with dry gauze and secure with silicone foam or Kerlix roll gauze whichever is preferred. Place L foot in prevalon boot to offload pressure [name]).  Paint L 2nd digit wound with Betadine  2 times daily and leave open to air or cover with dry gauze and tape.    POC discussed with bedside nurse.  WOC team will not follow. Patient should continue to follow with wound care center and vascular at discharge for ongoing management of wounds.   Thank you,    Powell Bar MSN, RN-BC, [employer]

## 2023-11-08 NOTE — Progress Notes (Signed)
 [facility] Progress Note  Patient: [name]    FMW:[id number]  DOA: 11/06/2023     Date of Service: the patient was seen and examined on 11/08/2023  Chief Complaint  Patient presents with   Wound Check   Brief hospital course: [name] is a 89 y.o. female with medical history significant of hypertension, hyperlipidemia, diabetes, carotid artery disease, PAD, CVA with left hemiparesis, dementia, hyperaldosteronism, chronic wound presenting with worsening wound changes and left 2nd toe swelling and ischemic changes   ED Course: Vital signs in the ED notable for temperature 96-98 F, heart rate in the 50s-80s, blood pressure in the 160s-210 systolic.   Lab workup included CMP with glucose 148, albumin 3.0.  CBC with hemoglobin stable at 9.6, platelets 134.  PT, PTT, INR normal.  Blood cultures pending.   Patient received heparin  and vancomycin  in the ED.  Vascular surgery was consulted and took patient for left lower extremity angiography.  Patient on floor post procedure.   Assessment and Plan:  # PAD # Critical left lower extremity ischemia # Chronic lower extremity wound > Patient presented from wound care with concerns about blood flow due to discoloration of left second toe.  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 Vascular surgery consulted s/p angioplasty left lower extremity done on 9/26 > Received Vancomycin  in the ED. WBC normal. - Monitoring on telemetry - Hold off on further antibiotics for now - WOC consult -Blood culture NGTD - Continue with heparin  - s/p Aspirin , started Plavix  on 9/27 - Continue home atorvastatin  9/28 Right Femoral artery duplex: No evidence of hemorrhage, hematoma or pseudoaneurysm.  RN was advised to hold heparin  for now as per vascular surgery note to rule out bleeding as hemoglobin dropped today.     # Acute blood loss anemia, post-op 9/28 Hb 6.9, transfuse 1 unit of PRBC Monitor H&H  # Hypertension - Continue  home lisinopril    Hyperlipidemia. - Continue home atorvastatin    Diabetes:  HbA1c 5.9 prediabetic range - SSI   Carotid artery disease - Continue atorvastatin  - Heparin , ASA, Plavix  as above   Dementia - Pleasantly demented - DSS guardian    # History of CVA > Left hemiparesis . - Continue atorvastatin  - Heparin  gtt and  ASA as above Held Plavix  for now    Body mass index is 25.96 kg/m.  Interventions:   Diet: Carb modified diet DVT Prophylaxis: Heparin  GTT  Advance goals of care discussion: DNR-limited  Family Communication: family was not present at bedside, at the time of interview.  Patient has significant dementia, AO x 1 Patient has DSS guardianship 9/28 d/w her guardian over the phone to get consent for PRBC transfusion.  Disposition:  Pt is from SNF, admitted with LLE ischemia s/p angioplasty, still has heparin  gtt., which precludes a safe discharge. Discharge to SNF, when stable and cleared by vascular surgery, may need few days to improve.  Subjective: No significant events overnight, overall patient is feeling improvement, she stated that pain is getting better on the left foot.  No new other active issues.   Physical Exam: General: NAD, lying comfortably Appear in no distress, affect appropriate Eyes: PERRLA ENT: Oral Mucosa Clear, moist  Neck: no JVD,  Cardiovascular: S1 and S2 Present, no Murmur,  Respiratory: good respiratory effort, Bilateral Air entry equal and Decreased, no Crackles, no wheezes Abdomen: Bowel Sound present, Soft and no tenderness,  Skin: no rashes Extremities: left 2nd toe swelling and discoloration, mild erythema.  S/p prior amputation of left 1st and 5th toe. RLE no Pedal edema, no calf tenderness Neurologic: without any new focal findings Gait not checked due to patient safety concerns  Vitals:   11/07/23 1616 11/07/23 2045 11/08/23 0354 11/08/23 1511  BP: 137/61 (!) 143/120 98/60 (!) 154/64  Pulse: 80 88 79 88   Resp: 16 16 19 20   Temp: 98.4 F (36.9 C) 98.4 F (36.9 C) 98.2 F (36.8 C) 98.1 F (36.7 C)  TempSrc: Oral Oral  Oral  SpO2: 99% 99% 100% 100%  Weight:      Height:        Intake/Output Summary (Last 24 hours) at 11/08/2023 1538 Last data filed at 11/08/2023 1300 Gross per 24 hour  Intake 360 ml  Output --  Net 360 ml   Filed Weights   11/06/23 1032  Weight: 68.6 kg    Data Reviewed: I have personally reviewed and interpreted daily labs, tele strips, imagings as discussed above. I reviewed all nursing notes, pharmacy notes, vitals, pertinent old records I have discussed plan of care as described above with RN and patient/family.  CBC: Recent Labs  Lab 11/06/23 1037 11/07/23 0256 11/07/23 1414 11/08/23 0601  WBC 9.4 8.5  --  11.0*  HGB 9.6* 7.8* 8.2* 6.9*  HCT 31.8* 24.6* 26.3* 22.2*  MCV 100.0 96.9  --  96.9  PLT 134* 252  --  242   Basic Metabolic Panel: Recent Labs  Lab 11/06/23 1037 11/07/23 0256 11/08/23 0601  NA 141 141 143  K 4.3 3.6 3.7  CL 104 111 110  CO2 24 24 22   GLUCOSE 148* 121* 147*  BUN 21 18 20   CREATININE 0.54 0.55 0.44  CALCIUM  8.9 8.1* 8.3*  MG  --   --  1.9  PHOS  --   --  2.1*    Studies: US  Lower Ext Art Right Ltd Result Date: 11/08/2023 CLINICAL DATA:  Anemia after left lower extremity arteriography after right common femoral artery access on 11/06/2023. EXAM: RIGHT LOWER EXTREMITY ARTERIAL DUPLEX SCAN TECHNIQUE: Gray-scale sonography as well as color Doppler and duplex ultrasound was performed to evaluate the lower extremity arteries at the level of the right groin. COMPARISON:  None Available. FINDINGS: Right common femoral artery is normally patent and demonstrates atherosclerosis. No evidence of pseudoaneurysm or dissection. Normal triphasic arterial waveform obtained within the common femoral artery. Proximal profunda femoral artery and proximal superficial femoral arteries demonstrate normal patency and duplex waveforms. No  evidence of AV fistula or significant superficial hematoma/hemorrhage in the right groin. IMPRESSION: No evidence of right femoral artery pseudoaneurysm, dissection, AV fistula or significant hematoma/hemorrhage. Electronically Signed   By: Marcey Moan M.D.   On: 11/08/2023 11:20    Scheduled Meds:  sodium chloride    Intravenous Once   atorvastatin   80 mg Oral QHS   clopidogrel   75 mg Oral Daily   collagenase   Topical Daily   feeding supplement (GLUCERNA SHAKE)  237 mL Oral TID BM   insulin  aspart  0-15 Units Subcutaneous TID WC   lisinopril   20 mg Oral Daily   sodium chloride  flush  3 mL Intravenous Q12H   Continuous Infusions:  sodium chloride  75 mL/hr at 11/08/23 0646   heparin  Stopped (11/08/23 1300)   potassium PHOSPHATE  IVPB (in mmol) 30 mmol (11/08/23 1303)   vancomycin      PRN Meds: acetaminophen  **OR** acetaminophen , diphenhydrAMINE , famotidine , hydrALAZINE  **OR** hydrALAZINE , methylPREDNISolone  (SOLU-MEDROL ) injection, midazolam , polyethylene glycol  Time spent: 55 minutes  Author:  Darnell Jeschke. MD [facility] 11/08/2023 3:38 PM  To reach On-call, see care teams to locate the attending and reach out to them via [email or web address]. If 7PM-7AM, please contact night-coverage If you still have difficulty reaching the attending provider, please page the [facility] (Director on Call) for [facility] on amion for assistance.

## 2023-11-08 NOTE — Consult Note (Signed)
 PHARMACY - ANTICOAGULATION CONSULT NOTE  Pharmacy Consult for Heparin  Indication: LLE ischemia  Allergies  Allergen Reactions   Metformin  And Related Nausea And Vomiting    Patient Measurements: Height: 5' 4 (162.6 cm) Weight: 68.6 kg (151 lb 3.8 oz) IBW/kg (Calculated) : 54.7 HEPARIN  DW (KG): 68.4  Vital Signs: Temp: 98.2 F (36.8 C) (09/28 0354) Temp Source: Oral (09/27 2045) BP: 98/60 (09/28 0354) Pulse Rate: 79 (09/28 0354)  Labs: Recent Labs    11/06/23 1037 11/06/23 1037 11/06/23 1126 11/06/23 2214 11/07/23 0256 11/07/23 0942 11/07/23 1414 11/07/23 2030 11/08/23 0601  HGB 9.6*  --   --   --  7.8*  --  8.2*  --  6.9*  HCT 31.8*  --   --   --  24.6*  --  26.3*  --  22.2*  PLT 134*  --   --   --  252  --   --   --  242  APTT  --   --  36 47*  --   --   --   --   --   LABPROT  --   --  14.8  --   --   --   --   --   --   INR  --   --  1.1  --   --   --   --   --   --   HEPARINUNFRC  --    < >  --  <0.10*  --  <0.10*  --  0.22* 0.39  CREATININE 0.54  --   --   --  0.55  --   --   --   --    < > = values in this interval not displayed.    Estimated Creatinine Clearance: 47.2 mL/min (by C-G formula based on SCr of 0.55 mg/dL).   Medical History: Past Medical History:  Diagnosis Date   Carpal tunnel syndrome on both sides    CVA (cerebral vascular accident) (HCC) 11/14/2019   Diabetes (HCC)    Diabetic polyneuropathy (HCC)    Diverticulitis    Hyperlipidemia    Hypertension    Osteoarthritis    Sepsis (HCC) 03/22/2018   Suspected UTI 04/09/2023   UTI (urinary tract infection) 09/20/2019   Vaginal prolapse     Medications:  Plavix  75mg  daily. Last dose unknown. No other antiplatelets/anticoagulants on file.  Assessment: 66 YOF with history of T2DM, PVD, and dementia. Presents to ED w/ LLE ischemia. Pharmacy has been consulted to dose continuous heparin  infusion. Baseline labs pending.  9/26 @ 2214:  aPTT = 47,  HL = < 0.1   ( RN called to say  heparin  gtt had been turned off [name]/t bleeding @ 2025) 9/27 0034- restarted drip  9/27 0942 HL < 0.10,  subtherapeutic 9/27 2030 HL 0.22, SUBtherapeutic 9/28 0601 HL 0.39, Therapeutic X 1   Goal of Therapy:  Heparin  level 0.3-0.7 units/ml Monitor platelets by anticoagulation protocol: Yes   Plan:  Per vascular note 9/27 am: POD #1 s/p LEFT lower extremity revascularization- angioplasty/thrombectomy. Bleeding from right groin access site last night. Resume Heparin  gtt until discharge then ASA/Plavix    9/28:  HL @ 0601 = 0.39, therapeutic X 1 - Will continue pt on current rate and recheck HL in 8 hrs - CBC daily  [name] 11/08/2023 7:05 AM

## 2023-11-09 ENCOUNTER — Inpatient Hospital Stay

## 2023-11-09 ENCOUNTER — Encounter: Payer: Self-pay | Admitting: Vascular Surgery

## 2023-11-09 DIAGNOSIS — G8191 Hemiplegia, unspecified affecting right dominant side: Secondary | ICD-10-CM | POA: Diagnosis not present

## 2023-11-09 DIAGNOSIS — Z515 Encounter for palliative care: Secondary | ICD-10-CM | POA: Diagnosis not present

## 2023-11-09 DIAGNOSIS — Z789 Other specified health status: Secondary | ICD-10-CM | POA: Diagnosis not present

## 2023-11-09 DIAGNOSIS — L97528 Non-pressure chronic ulcer of other part of left foot with other specified severity: Secondary | ICD-10-CM | POA: Diagnosis not present

## 2023-11-09 DIAGNOSIS — L97509 Non-pressure chronic ulcer of other part of unspecified foot with unspecified severity: Secondary | ICD-10-CM | POA: Diagnosis not present

## 2023-11-09 DIAGNOSIS — I96 Gangrene, not elsewhere classified: Secondary | ICD-10-CM | POA: Diagnosis not present

## 2023-11-09 DIAGNOSIS — G8194 Hemiplegia, unspecified affecting left nondominant side: Secondary | ICD-10-CM | POA: Diagnosis not present

## 2023-11-09 DIAGNOSIS — Z7902 Long term (current) use of antithrombotics/antiplatelets: Secondary | ICD-10-CM | POA: Diagnosis not present

## 2023-11-09 DIAGNOSIS — I7025 Atherosclerosis of native arteries of other extremities with ulceration: Secondary | ICD-10-CM | POA: Diagnosis not present

## 2023-11-09 DIAGNOSIS — Z7982 Long term (current) use of aspirin: Secondary | ICD-10-CM | POA: Diagnosis not present

## 2023-11-09 LAB — BPAM RBC
Blood Product Expiration Date: 202510252359
ISSUE DATE / TIME: 202509281519
Unit Type and Rh: 6200

## 2023-11-09 LAB — CBC
HCT: 25.7 % — ABNORMAL LOW (ref 36.0–46.0)
Hemoglobin: 8.1 g/dL — ABNORMAL LOW (ref 12.0–15.0)
MCH: 28.8 pg (ref 26.0–34.0)
MCHC: 31.5 g/dL (ref 30.0–36.0)
MCV: 91.5 fL (ref 80.0–100.0)
Platelets: 258 K/uL (ref 150–400)
RBC: 2.81 MIL/uL — ABNORMAL LOW (ref 3.87–5.11)
RDW: 16.4 % — ABNORMAL HIGH (ref 11.5–15.5)
WBC: 11.4 K/uL — ABNORMAL HIGH (ref 4.0–10.5)
nRBC: 0 % (ref 0.0–0.2)

## 2023-11-09 LAB — TYPE AND SCREEN
ABO/RH(D): A POS
Antibody Screen: NEGATIVE
Unit division: 0

## 2023-11-09 LAB — GLUCOSE, CAPILLARY
Glucose-Capillary: 114 mg/dL — ABNORMAL HIGH (ref 70–99)
Glucose-Capillary: 194 mg/dL — ABNORMAL HIGH (ref 70–99)
Glucose-Capillary: 155 mg/dL — ABNORMAL HIGH (ref 70–99)
Glucose-Capillary: 179 mg/dL — ABNORMAL HIGH (ref 70–99)

## 2023-11-09 LAB — MAGNESIUM: Magnesium: 1.8 mg/dL (ref 1.7–2.4)

## 2023-11-09 LAB — BASIC METABOLIC PANEL WITH GFR
Anion gap: 5 (ref 5–15)
BUN: 18 mg/dL (ref 8–23)
CO2: 22 mmol/L (ref 22–32)
Calcium: 7.8 mg/dL — ABNORMAL LOW (ref 8.9–10.3)
Chloride: 111 mmol/L (ref 98–111)
Creatinine, Ser: 0.57 mg/dL (ref 0.44–1.00)
GFR, Estimated: >60 mL/min (ref >60)
Glucose, Bld: 114 mg/dL — ABNORMAL HIGH (ref 70–99)
Potassium: 4.1 mmol/L (ref 3.5–5.1)
Sodium: 138 mmol/L (ref 135–145)

## 2023-11-09 LAB — HEMOGLOBIN AND HEMATOCRIT, BLOOD
HCT: 25.1 % — ABNORMAL LOW (ref 36.0–46.0)
Hemoglobin: 8.1 g/dL — ABNORMAL LOW (ref 12.0–15.0)

## 2023-11-09 LAB — HEPARIN LEVEL (UNFRACTIONATED)
Heparin Unfractionated: <0.1 [IU]/mL — ABNORMAL LOW (ref 0.30–0.70)
Heparin Unfractionated: <0.1 [IU]/mL — ABNORMAL LOW (ref 0.30–0.70)

## 2023-11-09 LAB — PHOSPHORUS: Phosphorus: 2.7 mg/dL (ref 2.5–4.6)

## 2023-11-09 MED ORDER — HEPARIN BOLUS VIA INFUSION
2050.0000 [IU] | Freq: Once | INTRAVENOUS | Status: AC
  Administered 2023-11-09: 2050 [IU] via INTRAVENOUS
  Filled 2023-11-09: qty 2050

## 2023-11-09 MED ORDER — FUROSEMIDE 10 MG/ML IJ SOLN
40.0000 mg | Freq: Once | INTRAMUSCULAR | Status: AC
  Administered 2023-11-09: 40 mg via INTRAVENOUS
  Filled 2023-11-09: qty 4

## 2023-11-09 MED ORDER — IPRATROPIUM-ALBUTEROL 0.5-2.5 (3) MG/3ML IN SOLN: 3.0000 mL | Freq: Four times a day (QID) | RESPIRATORY_TRACT | Status: DC | PRN

## 2023-11-09 MED ORDER — ASPIRIN 81 MG PO TBEC
81.0000 mg | DELAYED_RELEASE_TABLET | Freq: Every day | ORAL | Status: DC
  Administered 2023-11-09 – 2023-11-11 (×3): 81 mg via ORAL
  Filled 2023-11-09 (×3): qty 1

## 2023-11-09 NOTE — Consult Note (Signed)
 PHARMACY - ANTICOAGULATION CONSULT NOTE  Pharmacy Consult for Heparin  Indication: LLE ischemia  Allergies  Allergen Reactions   Metformin  And Related Nausea And Vomiting    Patient Measurements: Height: 5' 4 (162.6 cm) Weight: 68.6 kg (151 lb 3.8 oz) IBW/kg (Calculated) : 54.7 HEPARIN  DW (KG): 68.4  Vital Signs: Temp: 98.1 F (36.7 C) (09/29 0500) Temp Source: Oral (09/29 0500) BP: 133/54 (09/29 0438) Pulse Rate: 81 (09/29 0438)  Labs: Recent Labs    11/06/23 1126 11/06/23 2214 11/07/23 0256 11/07/23 0942 11/08/23 0601 11/08/23 1352 11/08/23 2007 11/09/23 0327  HGB  --   --  7.8*   < > 6.9*  --  8.6* 8.1*  HCT  --   --  24.6*   < > 22.2*  --  26.0* 25.7*  PLT  --   --  252  --  242  --   --  258  APTT 36 47*  --   --   --   --   --   --   LABPROT 14.8  --   --   --   --   --   --   --   INR 1.1  --   --   --   --   --   --   --   HEPARINUNFRC  --  <0.10*  --    < > 0.39 0.22*  --  <0.10*  CREATININE  --   --  0.55  --  0.44  --   --  0.57   < > = values in this interval not displayed.    Estimated Creatinine Clearance: 47.2 mL/min (by C-G formula based on SCr of 0.57 mg/dL).   Medical History: Past Medical History:  Diagnosis Date   Carpal tunnel syndrome on both sides    CVA (cerebral vascular accident) (HCC) 11/14/2019   Diabetes (HCC)    Diabetic polyneuropathy (HCC)    Diverticulitis    Hyperlipidemia    Hypertension    Osteoarthritis    Sepsis (HCC) 03/22/2018   Suspected UTI 04/09/2023   UTI (urinary tract infection) 09/20/2019   Vaginal prolapse     Medications:  Plavix  75mg  daily. Last dose unknown. No other antiplatelets/anticoagulants on file.  Assessment: 37 YOF with history of T2DM, PVD, and dementia. Presents to ED w/ LLE ischemia. Pharmacy has been consulted to dose continuous heparin  infusion. Baseline labs pending.  9/26 @ 2214:  aPTT = 47,  HL = < 0.1   ( RN called to say heparin  gtt had been turned off d/t bleeding @  2025) 9/27 0034- restarted drip  9/27 0942 HL < 0.10,  subtherapeutic 9/27 2030 HL 0.22, SUBtherapeutic 9/28 0601 HL 0.39, Therapeutic X 1  9/28 1352 HL 0.22, SUBtherapeutic  (note heparin  drip was paused ~1300 per vascular MD for Hgb 6.9)  Goal of Therapy:  Heparin  level 0.3-0.7 units/ml Monitor platelets by anticoagulation protocol: Yes   Plan:  Per vascular note 9/27 am: POD #1 s/p LEFT lower extremity revascularization- angioplasty/thrombectomy. Bleeding from right groin access site last night 9/26. Resume Heparin  gtt until discharge then ASA/Plavix    9/28: Heparin  drip paused ~1300 and patient received pRBC -Hgb 6.9  Plt 242  Per Vascular MD note 11/08/23:  No events overnight. No further bleeding. However, HgB now 6.9.  Plan: Hold Heparin  gtt. Transfuse pRBC.  Right Femoral artery duplex reviewed- No evidence of hemorrhage, hematoma or pseudoaneurysm  -rechecking Hgb to determine when to restart heparin  drip  9/28 Update: Per MD - to restart heparin  infusion without bolus. Heparin  infusion resumed at previous rate of 800 units/hr at ~1849. Will re-check heparin  level in ~8 hours. Continue to monitor CBC and for signs/symptoms of bleeding.    9/29:  HL @ 0327 = < 0.1, SUBtherapeutic - RN confirms heparin  gtt been running uninterrupted - Will order heparin  2050 units IV X 1 bolus and increase drip rate to 1050 units/hr - H&H not decreased from previous reading - Recheck HL 8 hrs after rate change - CBC daily  Shewanda Sharpe D 11/09/2023 6:09 AM

## 2023-11-09 NOTE — Consult Note (Signed)
 PHARMACY - ANTICOAGULATION CONSULT NOTE  Pharmacy Consult for Heparin  Indication: LLE ischemia  Allergies  Allergen Reactions   Metformin  And Related Nausea And Vomiting    Patient Measurements: Height: 5' 4 (162.6 cm) Weight: 68.6 kg (151 lb 3.8 oz) IBW/kg (Calculated) : 54.7 HEPARIN  DW (KG): 68.4  Vital Signs: Temp: 98.3 F (36.8 C) (09/29 0725) Temp Source: Axillary (09/29 0725) BP: 118/74 (09/29 0725) Pulse Rate: 73 (09/29 0725)  Labs: Recent Labs    11/06/23 2214 11/07/23 0256 11/07/23 0942 11/08/23 0601 11/08/23 1352 11/08/23 2007 11/09/23 0327 11/09/23 1411  HGB  --  7.8*   < > 6.9*  --  8.6* 8.1* 8.1*  HCT  --  24.6*   < > 22.2*  --  26.0* 25.7* 25.1*  PLT  --  252  --  242  --   --  258  --   APTT 47*  --   --   --   --   --   --   --   HEPARINUNFRC <0.10*  --    < > 0.39 0.22*  --  <0.10*  --   CREATININE  --  0.55  --  0.44  --   --  0.57  --    < > = values in this interval not displayed.    Estimated Creatinine Clearance: 47.2 mL/min (by C-G formula based on SCr of 0.57 mg/dL).   Medical History: Past Medical History:  Diagnosis Date   Carpal tunnel syndrome on both sides    CVA (cerebral vascular accident) (HCC) 11/14/2019   Diabetes (HCC)    Diabetic polyneuropathy (HCC)    Diverticulitis    Hyperlipidemia    Hypertension    Osteoarthritis    Sepsis (HCC) 03/22/2018   Suspected UTI 04/09/2023   UTI (urinary tract infection) 09/20/2019   Vaginal prolapse     Medications:  Plavix  75mg  daily. Last dose unknown. No other antiplatelets/anticoagulants on file.  Assessment: 87 YOF with history of T2DM, PVD, and dementia. Presents to ED w/ LLE ischemia. Pharmacy has been consulted to dose continuous heparin  infusion. Baseline labs pending.   Goal of Therapy:  Heparin  level 0.3-0.7 units/ml Monitor platelets by anticoagulation protocol: Yes  9/26 @ 2214:  aPTT = 47,  HL = < 0.1   ( RN called to say heparin  gtt had been turned off d/t  bleeding @ 2025) 9/27 0034- restarted drip  9/27 0942 HL < 0.10,  subtherapeutic 9/27 2030 HL 0.22, SUBtherapeutic 9/28 0601 HL 0.39, Therapeutic X 1  9/28 1352 HL 0.22, SUBtherapeutic  (note heparin  drip was paused ~1300 per vascular MD for Hgb 6.9)  Per vascular note 9/27 am: POD #1 s/p LEFT lower extremity revascularization- angioplasty/thrombectomy. Bleeding from right groin access site last night 9/26. Resume Heparin  gtt until discharge then ASA/Plavix    9/28: Heparin  drip paused ~1300 and patient received pRBC -Hgb 6.9  Plt 242  Per Vascular MD note 11/08/23:  No events overnight. No further bleeding. However, HgB now 6.9.  Plan: Hold Heparin  gtt. Transfuse pRBC.  Right Femoral artery duplex reviewed- No evidence of hemorrhage, hematoma or pseudoaneurysm  -rechecking Hgb to determine when to restart heparin  drip  9/28 Update: Per MD - to restart heparin  infusion without bolus. Heparin  infusion resumed at previous rate of 800 units/hr at ~1849. Will re-check heparin  level in ~8 hours. Continue to monitor CBC and for signs/symptoms of bleeding.   9/29: HL @ 0327 = < 0.1, SUBtherapeutic 9/29: HL @ 1411 = <  0.1, SUBtherapeutic   Plan:  - RN confirms heparin  gtt been running uninterrupted - Will order heparin  2050 units IV X 1 bolus and increase drip rate to 1300 units/hr - H&H slightly decreased from previous reading, will continue to monitor - Recheck HL 8 hrs after rate change - CBC daily  Lamon Rotundo A Rever Pichette 11/09/2023 3:20 PM

## 2023-11-09 NOTE — Progress Notes (Addendum)
 Palliative Care Progress Note, Assessment & Plan   Patient Name: [name]       Date: 11/09/2023 DOB: [birth year]  Age: 89 y.o. MRN#: [id number] Attending Physician: Von Bellis, MD Primary Care Physician: Rudolpho Norleen BIRCH, MD Admit Date: 11/06/2023  Subjective: Reports feeling better today but remains tired.  She denies pain.  Reports eating breakfast.  Denies chest pain/shortness of breath.  HPI: 89 y.o. female  with past medical history significant for dementia, DM II, CVA with left sided hemiparesis, HTN and HLD. Patient presented to ED from wound care clinin 11/06/2023 with concern for left foot ulceration and with inadequate blood flow.  Due to advanced dementia, patient was unable to provide history.    EDP exam found worsening L heel ulceration with darkening of the foot and no palpable pulses concerning for ischemic limb.    Pt ED labs not concerning but started on IV abx and heparin . ED vitals 184/78, HR 82, RR 17, SpO2 100% RA and 98.48F.    TRH consulted for admission and management of critical limb ischemia and chronic left lower extremity wound.    Vascular consulted for further evaluation. Patient's DSS legal guardian was present for this consult and consented to LLE angiogram with possible intervention for PAD with gangrene. Patient underwent angioplasty, stent placement x2 and mechanical thrombectomy under conscious sedation with successful recanalization of LLE for limb salvage.    Palliative care was consulted for assistance with goals of care conversations.  Legal guardian, Kellin Davis, DSS manages patients care.     Summary of counseling/coordination of care: Extensive chart review completed prior to meeting patient including labs, vital signs, imaging, progress notes,  orders, and available advanced directive documents from current and previous encounters.      Latest Ref Rng & Units 11/09/2023    2:11 PM 11/09/2023    3:27 AM 11/08/2023    8:07 PM  CBC  WBC 4.0 - 10.5 K/uL  11.4    Hemoglobin 12.0 - 15.0 g/dL 8.1  8.1  8.6   Hematocrit 36.0 - 46.0 % 25.1  25.7  26.0   Platelets 150 - 400 K/uL  258        Latest Ref Rng & Units 11/09/2023    3:27 AM 11/08/2023    6:01 AM 11/07/2023    2:56 AM  CMP  Glucose 70 - 99 mg/dL 885  852  878   BUN 8 - 23 mg/dL 18  20  18    Creatinine 0.44 - 1.00 mg/dL 9.42  9.55  9.44   Sodium 135 - 145 mmol/L 138  143  141   Potassium 3.5 - 5.1 mmol/L 4.1  3.7  3.6   Chloride 98 - 111 mmol/L 111  110  111   CO2 22 - 32 mmol/L 22  22  24    Calcium  8.9 - 10.3 mg/dL 7.8  8.3  8.1   Total Protein 6.5 - 8.1 g/dL   5.5   Total Bilirubin 0.0 - 1.2 mg/dL   0.6   Alkaline Phos 38 - 126 U/L   54   AST 15 - 41 U/L   20   ALT 0 - 44 U/L  10     After reviewing the patient's chart I assessed patient at bedside.  Ill-appearing, elderly female lying in bed.  She is alert but oriented only to self.  Patient appears more tired than in previous visits and endorses fatigue.  She is able to engage in conversation but is more lethargic than yesterday.  Respirations are even and unlabored.  She is in no distress.  Due to hemoglobin 6.9 yesterday, patient received 1 unit PRBCs.  Hgb today 8.1.   Physical Exam Vitals reviewed.  Constitutional:      General: She is not in acute distress.    Appearance: She is ill-appearing.     Comments: Frail  HENT:     Head: Normocephalic and atraumatic.     Mouth/Throat:     Mouth: Mucous membranes are moist.  Pulmonary:     Effort: Pulmonary effort is normal. No respiratory distress.  Musculoskeletal:     Comments: Unna boot and sock to left lower extremity  Skin:    General: Skin is warm and dry.  Neurological:     Mental Status: She is alert. She is disoriented.       Recommendations/Plan: DNR/DNI- Uploaded to Epic via [employer], signed DNR placed in paper chart Continue current supportive interventions Plan to d/c back to facility when medically stable  Contact DSS legal guardian Takako Minckler or  [name] with questions, concerns regarding patient          Total Time 50 minutes   Time spent includes: Detailed review of medical records (labs, imaging, vital signs), medically appropriate exam (mental status, respiratory, cardiac, skin), discussed with treatment team, counseling and educating patient, family and staff, documenting clinical information, medication management and coordination of care.     Devere Sacks, AMANDA [facility] Palliative Medicine Team  11/09/2023 12:41 PM  Office [phone]  Pager [phone]

## 2023-11-09 NOTE — Care Management Important Message (Signed)
 Important Message  Patient Details  Name: [name] MRN: [id number] Date of Birth: [birth year]   Important Message Given:  Yes - Medicare IM     Yuridia Couts W, CMA 11/09/2023, 2:08 PM

## 2023-11-09 NOTE — Progress Notes (Signed)
 [facility] Progress Note  Patient: [name]    FMW:[id number]  DOA: 11/06/2023     Date of Service: the patient was seen and examined on 11/09/2023  Chief Complaint  Patient presents with   Wound Check   Brief hospital course: [name] is a 89 y.o. female with medical history significant of hypertension, hyperlipidemia, diabetes, carotid artery disease, PAD, CVA with left hemiparesis, dementia, hyperaldosteronism, chronic wound presenting with worsening wound changes and left 2nd toe swelling and ischemic changes   ED Course: Vital signs in the ED notable for temperature 96-98 F, heart rate in the 50s-80s, blood pressure in the 160s-210 systolic.   Lab workup included CMP with glucose 148, albumin 3.0.  CBC with hemoglobin stable at 9.6, platelets 134.  PT, PTT, INR normal.  Blood cultures pending.   Patient received heparin  and vancomycin  in the ED.  Vascular surgery was consulted and took patient for left lower extremity angiography.  Patient on floor post procedure.   Assessment and Plan:  # PAD # Critical left lower extremity ischemia # Chronic lower extremity wound > Patient presented from wound care with concerns about blood flow due to discoloration of left second toe.  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 Vascular surgery consulted s/p angioplasty left lower extremity done on 9/26 > Received Vancomycin  in the ED. WBC normal. - Monitoring on telemetry - Hold off on further antibiotics for now - WOC consult -Blood culture NGTD - Continue with heparin  - s/p Aspirin , started Plavix  on 9/27 - Continue home atorvastatin  9/28 Right Femoral artery duplex: No evidence of hemorrhage, hematoma or pseudoaneurysm.  RN was advised to hold heparin  for now as per vascular surgery note to rule out bleeding as hemoglobin dropped today. 9/29 as per Vasc Sx DC heparin  gtt and start ASA 81 Consult podiatry tomorrow a.m.    # Acute blood loss anemia,  post-op 9/28 Hb 6.9, transfuse 1 unit of PRBC 9/29 Hb 8.1 stable Monitor H&H  # Hypertension - Continue home lisinopril    # Hyperlipidemia. - Continue home atorvastatin    # Diabetes:  HbA1c 5.9 prediabetic range - SSI   # Carotid artery disease - Continue atorvastatin  - s/p Heparin  gtt, continue ASA and Plavix    # Dementia - Pleasantly demented - DSS guardian    # History of CVA > Left hemiparesis . - Continue atorvastatin  - s/p Heparin  gtt,  and  ASA as above continue aspirin  and Plavix .   Body mass index is 25.96 kg/m.  Interventions:   Diet: Carb modified diet DVT Prophylaxis: Heparin  GTT  Advance goals of care discussion: DNR-limited  Family Communication: family was not present at bedside, at the time of interview.  Patient has significant dementia, AO x 1 Patient has DSS guardianship 9/28 d/w her guardian over the phone to get consent for PRBC transfusion.  Disposition:  Pt is from SNF, admitted with LLE ischemia s/p angioplasty, still has heparin  gtt., which precludes a safe discharge. Discharge to SNF, when stable and cleared by vascular surgery, may need few days to improve.  Subjective: No significant events overnight, patient denied any pain, having some difficulty breathing but overall she started feeling better as compared to yesterday.   Physical Exam: General: NAD, lying comfortably Appear in no distress, affect appropriate Eyes: PERRLA ENT: Oral Mucosa Clear, moist  Neck: no JVD,  Cardiovascular: S1 and S2 Present, no Murmur,  Respiratory: good respiratory effort, Bilateral Air entry equal and Decreased, mild crackles bilaterally, no  significant wheezes  Abdomen: Bowel Sound present, Soft and no tenderness,  Skin: no rashes Extremities: left 2nd toe swelling and discoloration, mild erythema.  S/p prior amputation of left 1st and 5th toe. RLE no Pedal edema, no calf tenderness Neurologic: without any new focal findings Gait not checked  due to patient safety concerns  Vitals:   11/08/23 1942 11/09/23 0438 11/09/23 0500 11/09/23 0725  BP: (!) 147/55 (!) 133/54  118/74  Pulse: 79 81  73  Resp: 17 17  20   Temp: 98.1 F (36.7 C)  98.1 F (36.7 C) 98.3 F (36.8 C)  TempSrc: Oral  Oral Axillary  SpO2: 100% 100%  98%  Weight:      Height:        Intake/Output Summary (Last 24 hours) at 11/09/2023 1517 Last data filed at 11/09/2023 1300 Gross per 24 hour  Intake 955.5 ml  Output --  Net 955.5 ml   Filed Weights   11/06/23 1032  Weight: 68.6 kg    Data Reviewed: I have personally reviewed and interpreted daily labs, tele strips, imagings as discussed above. I reviewed all nursing notes, pharmacy notes, vitals, pertinent old records I have discussed plan of care as described above with RN and patient/family.  CBC: Recent Labs  Lab 11/06/23 1037 11/07/23 0256 11/07/23 1414 11/08/23 0601 11/08/23 2007 11/09/23 0327 11/09/23 1411  WBC 9.4 8.5  --  11.0*  --  11.4*  --   HGB 9.6* 7.8* 8.2* 6.9* 8.6* 8.1* 8.1*  HCT 31.8* 24.6* 26.3* 22.2* 26.0* 25.7* 25.1*  MCV 100.0 96.9  --  96.9  --  91.5  --   PLT 134* 252  --  242  --  258  --    Basic Metabolic Panel: Recent Labs  Lab 11/06/23 1037 11/07/23 0256 11/08/23 0601 11/09/23 0327  NA 141 141 143 138  K 4.3 3.6 3.7 4.1  CL 104 111 110 111  CO2 24 24 22 22   GLUCOSE 148* 121* 147* 114*  BUN 21 18 20 18   CREATININE 0.54 0.55 0.44 0.57  CALCIUM  8.9 8.1* 8.3* 7.8*  MG  --   --  1.9 1.8  PHOS  --   --  2.1* 2.7    Studies: No results found.   Scheduled Meds:  atorvastatin   80 mg Oral QHS   clopidogrel   75 mg Oral Daily   collagenase   Topical Daily   feeding supplement (GLUCERNA SHAKE)  237 mL Oral TID BM   insulin  aspart  0-15 Units Subcutaneous TID WC   lisinopril   20 mg Oral Daily   sodium chloride  flush  3 mL Intravenous Q12H   Continuous Infusions:  sodium chloride  75 mL/hr at 11/08/23 1854   heparin  1,050 Units/hr (11/09/23 1413)    vancomycin      PRN Meds: acetaminophen  **OR** acetaminophen , diphenhydrAMINE , famotidine , hydrALAZINE  **OR** hydrALAZINE , ipratropium-albuterol , methylPREDNISolone  (SOLU-MEDROL ) injection, midazolam , polyethylene glycol  Time spent: 55 minutes  Author: ELVAN SOR. MD [facility] 11/09/2023 3:17 PM  To reach On-call, see care teams to locate the attending and reach out to them via [email or web address]. If 7PM-7AM, please contact night-coverage If you still have difficulty reaching the attending provider, please page the [facility] (Director on Call) for [facility] on amion for assistance.

## 2023-11-09 NOTE — Progress Notes (Signed)
 Progress Note    11/09/2023 7:23 PM 3 Days Post-Op  Subjective:  [name] is an 89 yo female who is now POD #3 from:  Procedure(s) Performed:             1.  Introduction catheter into left lower extremity 3rd order catheter placement               2.    Contrast injection left lower extremity for distal runoff             3.  Percutaneous transluminal angioplasty and stent placement left popliteal artery to 4 mm             4.  Percutaneous transluminal angioplasty and stent placement to the peroneal artery to 3.75 mm             5.  Mechanical thrombectomy of the popliteal artery occlusion and in-stent restenosis with the penumbra CAT 6 bolt catheter.               6.  Celt closure right common femoral arteriotomy  Patient has an moderate to severe dementia and is under DDS supervision and protection. Patient appears to be comfortable. No distress noted. Vitals all remain stable.    Vitals:   11/09/23 0725 11/09/23 1529  BP: 118/74 (!) 138/59  Pulse: 73 73  Resp: 20 16  Temp: 98.3 F (36.8 C) 98 F (36.7 C)  SpO2: 98% 100%   Physical Exam: Cardiac:  RRR, normal S1 and S2.  No murmurs. Lungs: Nonlabored breathing, clear on auscultation throughout.  No rales rhonchi or wheezing. Incisions: Right groin puncture site with dressing clean dry and intact.  No hematoma seroma no. Extremities: Left lower extremity contracted due to history of CVA.  Left foot warm to touch with Doppler PT pulse. Abdomen: Positive bowel sounds throughout, soft, nontender nondistended. Neurologic: Moderate to severe dementia.  CBC    Component Value Date/Time   WBC 11.4 (H) 11/09/2023 0327   RBC 2.81 (L) 11/09/2023 0327   HGB 8.1 (L) 11/09/2023 1411   HGB 8.6 (L) 09/25/2013 0410   HCT 25.1 (L) 11/09/2023 1411   HCT 27.0 (L) 09/24/2013 0409   PLT 258 11/09/2023 0327   PLT 192 09/24/2013 0409   MCV 91.5 11/09/2023 0327   MCV 96 09/24/2013 0409   MCH 28.8 11/09/2023 0327   MCHC 31.5  11/09/2023 0327   RDW 16.4 (H) 11/09/2023 0327   RDW 13.8 09/24/2013 0409   LYMPHSABS 2.3 04/28/2023 1532   LYMPHSABS 2.5 09/24/2013 0409   MONOABS 0.5 04/28/2023 1532   MONOABS 0.7 09/24/2013 0409   EOSABS 0.5 04/28/2023 1532   EOSABS 0.4 09/24/2013 0409   BASOSABS 0.0 04/28/2023 1532   BASOSABS 0.0 09/24/2013 0409    BMET    Component Value Date/Time   NA 138 11/09/2023 0327   NA 143 09/24/2013 0409   K 4.1 11/09/2023 0327   K 3.6 09/24/2013 0409   CL 111 11/09/2023 0327   CL 108 (H) 09/24/2013 0409   CO2 22 11/09/2023 0327   CO2 27 09/24/2013 0409   GLUCOSE 114 (H) 11/09/2023 0327   GLUCOSE 222 (H) 09/24/2013 0409   BUN 18 11/09/2023 0327   BUN 7 09/24/2013 0409   CREATININE 0.57 11/09/2023 0327   CREATININE 0.77 09/24/2013 0409   CALCIUM  7.8 (L) 11/09/2023 0327   CALCIUM  7.7 (L) 09/24/2013 0409   GFRNONAA >60 11/09/2023 0327   GFRNONAA >60 09/24/2013 0409  GFRAA >60 09/26/2019 1952   GFRAA >60 09/24/2013 0409    INR    Component Value Date/Time   INR 1.1 11/06/2023 1126     Intake/Output Summary (Last 24 hours) at 11/09/2023 1923 Last data filed at 11/09/2023 1300 Gross per 24 hour  Intake 420 ml  Output --  Net 420 ml     Assessment/Plan:  89 y.o. female is s/p SEE ABOVE  3 Days Post-Op   PLAN Discontinue heparin  infusion today and convert patient to DAPT therapy.  Patient to start aspirin  81 mg daily and Plavix  75 mg daily. Okay per vascular surgery for patient to discharge back to her current living conditions under DSS supervision.   DVT prophylaxis:  Heparin  Infusion    [name] [facility] 11/09/2023 7:23 PM

## 2023-11-09 NOTE — Evaluation (Signed)
 Physical Therapy Evaluation Patient Details Name: [name] MRN: [id number] DOB: 06/26/1933 Today's Date: 11/09/2023  History of Present Illness  [name] is a 89 y.o. female with medical history significant of hypertension, hyperlipidemia, diabetes, carotid artery disease, PAD, CVA with left hemiparesis, dementia, hyperaldosteronism, chronic wound presenting with worsening wound changes and toe changes.  Clinical Impression  Pt is pleasant 89 y.o. female admitted with chronic worsening wound changes. Pt is poor historian and unable to provide accurate info about PLOF. Info from OT note, who obtained information from pt's guardian. Per the guardian, pt has been non ambulatory for years and is dependent for ADLs and transfers. This evaluation, pt attempt to sit at EOB with MAX A and reported extreme pain/discomfort in her LLE; Pt presents with LLE contracture from s/p chronic CVA. Pt able to perform bed mobility with MAX A. She is able to hold on to bed rails however requires assistance to achieve full roll. Pt demonstrates all bed mobility/transfers at baseline level. Pt does not require any further PT needs at this time. Pt will be dc in house and does not require follow up. RN aware. Will dc current orders.       If plan is discharge home, recommend the following: A lot of help with walking and/or transfers;A lot of help with bathing/dressing/bathroom;Assistance with feeding;Assist for transportation;Direct supervision/assist for medications management;Supervision due to cognitive status;Direct supervision/assist for financial management   Can travel by private vehicle   No    Equipment Recommendations None recommended by PT  Recommendations for Other Services       Functional Status Assessment Patient has not had a recent decline in their functional status     Precautions / Restrictions Precautions Precautions: Fall Recall of Precautions/Restrictions:  Intact Restrictions Weight Bearing Restrictions Per Provider Order: No      Mobility  Bed Mobility Overal bed mobility: Needs Assistance Bed Mobility: Rolling, Sidelying to Sit Rolling: Max assist Sidelying to sit: HOB elevated, Max assist     Sit to sidelying: HOB elevated, Max assist General bed mobility comments: Pt able to reach with RUE to grip railings however needed MAX A to obtain full roll. Pt able to iniate sitting at EOB and move LEs, but pt unable to sit at EOB d/t discomfort with LLE/contracture.    Transfers Overall transfer level: Needs assistance                 General transfer comment: unsafe to attempt    Ambulation/Gait               General Gait Details: Pt unable to ambulate at baseline  Stairs            Wheelchair Mobility     Tilt Bed    Modified Rankin (Stroke Patients Only)       Balance Overall balance assessment: Needs assistance     Sitting balance - Comments: unable to sit at EOB d/t discomfort/pain from LLE contracture       Standing balance comment: unable                             Pertinent Vitals/Pain Pain Assessment Pain Assessment: No/denies pain Breathing: normal Negative Vocalization: none Facial Expression: smiling or inexpressive Body Language: relaxed Consolability: no need to console PAINAD Score: 0    Home Living Family/patient expects to be discharged to:: Other (Comment) ([facility] long term [facility])  Additional Comments: From [facility]    Prior Function Prior Level of Function : Needs assist             Mobility Comments: Pt unable to report PLOF d/t poor cognition. Per OT note: Non ambulitory for multiple years per her guardian, reports facility staff use lift or pick her up to place her in wheelchair. ADLs Comments: Pt unable to report PLOF d/t poor cognition. Per OT note: Able to feed herself after her food has been cut up and  brought close to her. Dependent for asssitance for all ADL/IADLs at baseline     Extremity/Trunk Assessment   Upper Extremity Assessment Upper Extremity Assessment: Generalized weakness;LUE deficits/detail LUE Deficits / Details: Chronic L-sided hemiparesis LUE Coordination: decreased fine motor;decreased gross motor    Lower Extremity Assessment Lower Extremity Assessment: Overall WFL for tasks assessed;LLE deficits/detail LLE Deficits / Details: Able to passively extend LLE, however not through range.    Cervical / Trunk Assessment Cervical / Trunk Assessment: Normal  Communication   Communication Communication: No apparent difficulties    Cognition Arousal: Alert Behavior During Therapy: WFL for tasks assessed/performed   PT - Cognitive impairments: History of cognitive impairments                       PT - Cognition Comments: pleasant and agreeable to PT session. Following commands: Impaired Following commands impaired: Follows one step commands with increased time     Cueing Cueing Techniques: Verbal cues, Tactile cues     General Comments      Exercises Other Exercises Other Exercises: MAX A for peri care.   Assessment/Plan    PT Assessment Patient does not need any further PT services  PT Problem List         PT Treatment Interventions      PT Goals (Current goals can be found in the Care Plan section)  Acute Rehab PT Goals Patient Stated Goal: none stated PT Goal Formulation: Patient unable to participate in goal setting Time For Goal Achievement: 11/23/23 Potential to Achieve Goals: Fair    Frequency       Co-evaluation               AM-PAC PT 6 Clicks Mobility  Outcome Measure Help needed turning from your back to your side while in a flat bed without using bedrails?: A Lot Help needed moving from lying on your back to sitting on the side of a flat bed without using bedrails?: Total Help needed moving to and from a bed  to a chair (including a wheelchair)?: Total Help needed standing up from a chair using your arms (e.g., wheelchair or bedside chair)?: Total Help needed to walk in hospital room?: Total Help needed climbing 3-5 steps with a railing? : Total 6 Click Score: 7    End of Session   Activity Tolerance: Patient tolerated treatment well Patient left: in bed;with bed alarm set;with call bell/phone within reach Nurse Communication: Mobility status PT Visit Diagnosis: Difficulty in walking, not elsewhere classified (R26.2)    Time: 9074-9056 PT Time Calculation (min) (ACUTE ONLY): 18 min   Charges:                 Zylie Mumaw, SPT   Ayo Smoak 11/09/2023, 10:47 AM

## 2023-11-09 NOTE — Plan of Care (Signed)
  Problem: Clinical Measurements: Goal: Ability to maintain clinical measurements within normal limits will improve Outcome: Progressing Goal: Will remain free from infection Outcome: Progressing Goal: Diagnostic test results will improve Outcome: Progressing Goal: Respiratory complications will improve Outcome: Progressing Goal: Cardiovascular complication will be avoided Outcome: Progressing   Problem: Activity: Goal: Risk for activity intolerance will decrease Outcome: Progressing   Problem: Nutrition: Goal: Adequate nutrition will be maintained Outcome: Progressing   Problem: Coping: Goal: Level of anxiety will decrease Outcome: Progressing   Problem: Elimination: Goal: Will not experience complications related to bowel motility Outcome: Progressing Goal: Will not experience complications related to urinary retention Outcome: Progressing   Problem: Pain Managment: Goal: General experience of comfort will improve and/or be controlled Outcome: Progressing

## 2023-11-10 ENCOUNTER — Inpatient Hospital Stay

## 2023-11-10 DIAGNOSIS — L97509 Non-pressure chronic ulcer of other part of unspecified foot with unspecified severity: Secondary | ICD-10-CM | POA: Diagnosis not present

## 2023-11-10 DIAGNOSIS — I7025 Atherosclerosis of native arteries of other extremities with ulceration: Secondary | ICD-10-CM | POA: Diagnosis not present

## 2023-11-10 LAB — CBC
HCT: 26.4 % — ABNORMAL LOW (ref 36.0–46.0)
Hemoglobin: 8.3 g/dL — ABNORMAL LOW (ref 12.0–15.0)
MCH: 29.1 pg (ref 26.0–34.0)
MCHC: 31.4 g/dL (ref 30.0–36.0)
MCV: 92.6 fL (ref 80.0–100.0)
Platelets: 260 K/uL (ref 150–400)
RBC: 2.85 MIL/uL — ABNORMAL LOW (ref 3.87–5.11)
RDW: 15.9 % — ABNORMAL HIGH (ref 11.5–15.5)
WBC: 9.8 K/uL (ref 4.0–10.5)
nRBC: 0 % (ref 0.0–0.2)

## 2023-11-10 LAB — BASIC METABOLIC PANEL WITH GFR
Anion gap: 8 (ref 5–15)
BUN: 22 mg/dL (ref 8–23)
CO2: 22 mmol/L (ref 22–32)
Calcium: 8.1 mg/dL — ABNORMAL LOW (ref 8.9–10.3)
Chloride: 110 mmol/L (ref 98–111)
Creatinine, Ser: 0.79 mg/dL (ref 0.44–1.00)
GFR, Estimated: >60 mL/min (ref >60)
Glucose, Bld: 166 mg/dL — ABNORMAL HIGH (ref 70–99)
Potassium: 4 mmol/L (ref 3.5–5.1)
Sodium: 140 mmol/L (ref 135–145)

## 2023-11-10 LAB — GLUCOSE, CAPILLARY
Glucose-Capillary: 159 mg/dL — ABNORMAL HIGH (ref 70–99)
Glucose-Capillary: 147 mg/dL — ABNORMAL HIGH (ref 70–99)
Glucose-Capillary: 173 mg/dL — ABNORMAL HIGH (ref 70–99)
Glucose-Capillary: 138 mg/dL — ABNORMAL HIGH (ref 70–99)
Glucose-Capillary: 180 mg/dL — ABNORMAL HIGH (ref 70–99)

## 2023-11-10 LAB — PHOSPHORUS: Phosphorus: 2.5 mg/dL (ref 2.5–4.6)

## 2023-11-10 LAB — MAGNESIUM: Magnesium: 1.7 mg/dL (ref 1.7–2.4)

## 2023-11-10 MED ORDER — ENOXAPARIN SODIUM 40 MG/0.4ML IJ SOSY
40.0000 mg | PREFILLED_SYRINGE | Freq: Every evening | INTRAMUSCULAR | Status: DC
Start: 2023-11-10 — End: 2023-11-12
  Administered 2023-11-10: 40 mg via SUBCUTANEOUS
  Filled 2023-11-10: qty 0.4

## 2023-11-10 MED ORDER — LOPERAMIDE HCL 2 MG PO CAPS
2.0000 mg | ORAL_CAPSULE | Freq: Once | ORAL | Status: AC | PRN
  Administered 2023-11-10: 2 mg via ORAL
  Filled 2023-11-10: qty 1

## 2023-11-10 NOTE — TOC Progression Note (Signed)
 Transition of Care [facility]) - Progression Note    Patient Details  Name: LILAC HOFF MRN: [id number] Date of Birth: [birth year]  [facility] of [facility]) CM/SW Contact  Delphine KANDICE Bring, RN Phone Number: 11/10/2023, 10:46 AM  Clinical Narrative:    Cm spoke with [name] at [facility]. Patient is a long-term care resident. Per Dr. Von waiting on podiatry to see patient . CM informed [name] patient may d/c today pending on podiatry. [name] state just let her know.                      Expected Discharge Plan and Services                                               Social Drivers of Health (SDOH) Interventions SDOH Screenings   Food Insecurity: No Food Insecurity (11/06/2023)  Housing: Low Risk  (11/06/2023)  Transportation Needs: No Transportation Needs (11/06/2023)  Utilities: Not At Risk (11/06/2023)  Social Connections: Patient Unable To Answer (11/06/2023)  Tobacco Use: Low Risk  (11/06/2023)    Readmission Risk Interventions    05/01/2023    4:26 PM  Readmission Risk Prevention Plan  Transportation Screening Complete  HRI or Home Care Consult Complete  Social Work Consult for Recovery Care Planning/Counseling Complete  Medication Review Oceanographer) Complete

## 2023-11-10 NOTE — Progress Notes (Signed)
 [facility] Progress Note  Patient: [name]    FMW:[id number]  DOA: 11/06/2023     Date of Service: the patient was seen and examined on 11/10/2023  Chief Complaint  Patient presents with   Wound Check   Brief hospital course: [name] is a 89 y.o. female with medical history significant of hypertension, hyperlipidemia, diabetes, carotid artery disease, PAD, CVA with left hemiparesis, dementia, hyperaldosteronism, chronic wound presenting with worsening wound changes and left 2nd toe swelling and ischemic changes   ED Course: Vital signs in the ED notable for temperature 96-98 F, heart rate in the 50s-80s, blood pressure in the 160s-210 systolic.   Lab workup included CMP with glucose 148, albumin 3.0.  CBC with hemoglobin stable at 9.6, platelets 134.  PT, PTT, INR normal.  Blood cultures pending.   Patient received heparin  and vancomycin  in the ED.  Vascular surgery was consulted and took patient for left lower extremity angiography.  Patient on floor post procedure.   Assessment and Plan:  # PAD # Critical left lower extremity ischemia # Chronic lower extremity wound > Patient presented from wound care with concerns about blood flow due to discoloration of left second toe.  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 Vascular surgery consulted s/p angioplasty left lower extremity done on 9/26 > Received Vancomycin  in the ED. WBC normal. - Monitoring on telemetry - Hold off on further antibiotics for now - WOC consult -Blood culture NGTD - Continue with heparin  - s/p Aspirin , started Plavix  on 9/27 - Continue home atorvastatin  9/28 Right Femoral artery duplex: No evidence of hemorrhage, hematoma or pseudoaneurysm.  RN was advised to hold heparin  for now as per vascular surgery note to rule out bleeding as hemoglobin dropped today. 9/29 as per Vasc Sx DC heparin  gtt and start ASA 81 9/30 podiatry consulted, recommended x-ray of foot which is reported  negative for osteomyelitis.   If cleared by podiatry then she can be discharged to SNF tomorrow a.m.    # Acute blood loss anemia, post-op 9/28 Hb 6.9, transfuse 1 unit of PRBC 9/30 Hb 8.3 stable Monitor H&H  # Hypertension - Continue home lisinopril    # Hyperlipidemia. - Continue home atorvastatin    # Diabetes:  HbA1c 5.9 prediabetic range - SSI   # Carotid artery disease - Continue atorvastatin  - s/p Heparin  gtt, continue ASA and Plavix    # Dementia - Pleasantly demented - DSS guardian    # History of CVA > Left hemiparesis . - Continue atorvastatin  - s/p Heparin  gtt, continue aspirin  and Plavix .   Body mass index is 25.96 kg/m.  Interventions:   Diet: Carb modified diet DVT Prophylaxis: Lovenox   Advance goals of care discussion: DNR-limited  Family Communication: family was not present at bedside, at the time of interview.  Patient has significant dementia, AO x 1 Patient has DSS guardianship 9/28 d/w her guardian over the phone to get consent for PRBC transfusion.  Disposition:  Pt is from SNF, admitted with LLE ischemia s/p angioplasty, s/p IV infusion.  Seen by podiatry on 9/30 Cleared by vascular surgery.  Clinically stable, medically optimized. Discharge to SNF tomorrow a.m. if cleared by podiatry, and no surgical intervention.   Subjective: No significant events overnight, patient was resting comfortably, denied any significant pain in the left foot.  Breathing is better, no new complaints,   Physical Exam: General: NAD, lying comfortably Appear in no distress, affect appropriate Eyes: PERRLA ENT: Oral Mucosa Clear, moist  Neck:  no JVD,  Cardiovascular: S1 and S2 Present, no Murmur,  Respiratory: good respiratory effort, Bilateral Air entry equal and Decreased, mild crackles bilaterally, no significant wheezes  Abdomen: Bowel Sound present, Soft and no tenderness,  Skin: no rashes Extremities: left 2nd toe swelling and discoloration, mild  erythema.  S/p prior amputation of left 1st and 5th toe.  Contracted left leg due to prior CVA RLE no Pedal edema, no calf tenderness Neurologic: Left hemiparesis due to prior stroke.  Gait not checked due to patient safety concerns  Vitals:   11/10/23 0422 11/10/23 0731 11/10/23 1616 11/10/23 1644  BP: (!) 136/100 (!) 146/53 (!) 150/119 (!) 148/77  Pulse: (!) 108 75  62  Resp: 18 16 16    Temp: 98.6 F (37 C) 98.2 F (36.8 C) 99.7 F (37.6 C)   TempSrc: Oral Oral Oral   SpO2:  100% 100% 98%  Weight:      Height:        Intake/Output Summary (Last 24 hours) at 11/10/2023 1725 Last data filed at 11/10/2023 1348 Gross per 24 hour  Intake 1140 ml  Output 800 ml  Net 340 ml   Filed Weights   11/06/23 1032  Weight: 68.6 kg    Data Reviewed: I have personally reviewed and interpreted daily labs, tele strips, imagings as discussed above. I reviewed all nursing notes, pharmacy notes, vitals, pertinent old records I have discussed plan of care as described above with RN and patient/family.  CBC: Recent Labs  Lab 11/06/23 1037 11/07/23 0256 11/07/23 1414 11/08/23 0601 11/08/23 2007 11/09/23 0327 11/09/23 1411 11/10/23 0545  WBC 9.4 8.5  --  11.0*  --  11.4*  --  9.8  HGB 9.6* 7.8*   < > 6.9* 8.6* 8.1* 8.1* 8.3*  HCT 31.8* 24.6*   < > 22.2* 26.0* 25.7* 25.1* 26.4*  MCV 100.0 96.9  --  96.9  --  91.5  --  92.6  PLT 134* 252  --  242  --  258  --  260   < > = values in this interval not displayed.   Basic Metabolic Panel: Recent Labs  Lab 11/06/23 1037 11/07/23 0256 11/08/23 0601 11/09/23 0327 11/10/23 0545  NA 141 141 143 138 140  K 4.3 3.6 3.7 4.1 4.0  CL 104 111 110 111 110  CO2 24 24 22 22 22   GLUCOSE 148* 121* 147* 114* 166*  BUN 21 18 20 18 22   CREATININE 0.54 0.55 0.44 0.57 0.79  CALCIUM  8.9 8.1* 8.3* 7.8* 8.1*  MG  --   --  1.9 1.8 1.7  PHOS  --   --  2.1* 2.7 2.5    Studies: DG Foot Complete Left Result Date: 11/10/2023 CLINICAL DATA:  Gangrene.  EXAM: LEFT FOOT - COMPLETE 3+ VIEW COMPARISON:  Foot radiograph 08/26/2022 FINDINGS: Previous transmetatarsal amputation of the first ray. Previous resection of the fifth ray. The bones are diffusely under mineralized. Hammertoe deformity of the remaining toes. No obvious erosion or bony destructive change. No acute fracture. Prominent vascular calcifications. Generalized soft tissue edema. No soft tissue gas. A dressing/artifact overlies the hindfoot. IMPRESSION: 1. Previous transmetatarsal amputation of the first ray. Previous resection of the fifth ray. 2. No radiographic findings of osteomyelitis. Osteopenia/osteoporosis limits assessment. 3. Generalized soft tissue edema. No soft tissue gas. Electronically Signed   By: Andrea Gasman M.D.   On: 11/10/2023 17:12   DG Chest Port 1 View Result Date: 11/09/2023 CLINICAL DATA:  Pulmonary edema. EXAM:  PORTABLE CHEST 1 VIEW COMPARISON:  Chest radiograph dated 08/07/2022. FINDINGS: No focal consolidation, pleural effusion or pneumothorax. The cardiac silhouette is within normal limits. Coronary vascular calcification as well as atherosclerotic calcification of the aortic arch. No acute osseous pathology. Severe right shoulder arthritic changes. IMPRESSION: 1. No active disease. 2. Atherosclerotic disease. Electronically Signed   By: Vanetta Chou M.D.   On: 11/09/2023 18:37     Scheduled Meds:  aspirin  EC  81 mg Oral Daily   atorvastatin   80 mg Oral QHS   clopidogrel   75 mg Oral Daily   collagenase   Topical Daily   feeding supplement (GLUCERNA SHAKE)  237 mL Oral TID BM   insulin  aspart  0-15 Units Subcutaneous TID WC   lisinopril   20 mg Oral Daily   sodium chloride  flush  3 mL Intravenous Q12H   Continuous Infusions:   PRN Meds: acetaminophen  **OR** acetaminophen , diphenhydrAMINE , famotidine , hydrALAZINE  **OR** hydrALAZINE , ipratropium-albuterol , methylPREDNISolone  (SOLU-MEDROL ) injection, midazolam , polyethylene glycol  Time spent: 40  minutes  Author: ELVAN SOR. MD [facility] 11/10/2023 5:25 PM  To reach On-call, see care teams to locate the attending and reach out to them via [email or web address]. If 7PM-7AM, please contact night-coverage If you still have difficulty reaching the attending provider, please page the [facility] (Director on Call) for [facility] on amion for assistance.

## 2023-11-10 NOTE — Consult Note (Signed)
 PODIATRIC SURGERY: CONSULT NOTE  Consulting Physician: Dr. Von   Reason for Consult: R 2nd digit gangrene   HPI: [name] is a 89 y.o. female with PMH for dementia, diabetes, CVA with left hemiparesis, HTN, HLD, presents to the emergency department with  that presented to the ED for evaluation of wound/ infection concern to the LEFT FOOT 2nd digit / critical limb ischemia..   Of note: Patient has been seen by [facility] in outpatient setting for L foot / heel wound and has had multiple h/o operations on the L foot - WCC prompted eval from the L 2nd toe darkening.   During hospital admission, she has had underwent angioplastly of LLE on 09/26.   Podiatry consulted for evaluation of wound L 2nd digit.   Denies: F/C/N/V  SOB/CP/calf pain.    PMHx:  Past Medical History:  Diagnosis Date   Carpal tunnel syndrome on both sides    CVA (cerebral vascular accident) (HCC) 11/14/2019   Diabetes (HCC)    Diabetic polyneuropathy (HCC)    Diverticulitis    Hyperlipidemia    Hypertension    Osteoarthritis    Sepsis (HCC) 03/22/2018   Suspected UTI 04/09/2023   UTI (urinary tract infection) 09/20/2019   Vaginal prolapse     Surgical Hx:  Past Surgical History:  Procedure Laterality Date   AMPUTATION Left 05/01/2023   Procedure: AMPUTATION, FOOT, RAY;  Surgeon: Lennie Barter, DPM;  Location: [facility] ORS;  Service: Orthopedics/Podiatry;  Laterality: Left;  LEFT PARTIAL FIRST RAY   AMPUTATION TOE Left 08/13/2022   Procedure: EXCISION OF 5TH RAY;  Surgeon: Ashley Soulier, DPM;  Location: [facility] ORS;  Service: Orthopedics/Podiatry;  Laterality: Left;   CESAREAN SECTION     IRRIGATION AND DEBRIDEMENT FOOT Left 01/09/2022   Procedure: IRRIGATION AND DEBRIDEMENT FOOT WITH BONE BIOPSY;  Surgeon: Ashley Soulier, DPM;  Location: [facility] ORS;  Service: Podiatry;  Laterality: Left;   LOWER EXTREMITY ANGIOGRAPHY Left 06/11/2021   Procedure: Lower Extremity Angiography;  Surgeon: Jama Cordella MATSU, MD;   Location: [facility] INVASIVE CV LAB;  Service: Cardiovascular;  Laterality: Left;   LOWER EXTREMITY ANGIOGRAPHY Left 08/11/2022   Procedure: Lower Extremity Angiography;  Surgeon: Jama Cordella MATSU, MD;  Location: [facility] INVASIVE CV LAB;  Service: Cardiovascular;  Laterality: Left;   LOWER EXTREMITY ANGIOGRAPHY Left 04/30/2023   Procedure: Lower Extremity Angiography;  Surgeon: Marea Selinda RAMAN, MD;  Location: [facility] INV[facility]IVE CV LAB;  Service: Cardiovascular;  Laterality: Left;   LOWER EXTREMITY ANGIOGRAPHY Left 11/06/2023   Procedure: Lower Extremity Angiography;  Surgeon: Jama Cordella MATSU, MD;  Location: [facility] INVASIVE CV LAB;  Service: Cardiovascular;  Laterality: Left;   pessary     REPLACEMENT TOTAL KNEE BILATERAL     VENTRAL HERNIA REPAIR     VENTRAL HERNIA REPAIR N/A 01/13/2016   Procedure: HERNIA REPAIR VENTRAL ADULT WITH SMALL BOWEL RESECTION, LYSIS OF ADHESIONS;  Surgeon: Dorothyann LITTIE Husk, MD;  Location: [facility] ORS;  Service: General;  Laterality: N/A;    FHx:  Family History  Problem Relation Age of Onset   Breast cancer Mother 45   Breast cancer Maternal Aunt     Social History:  reports that she has never smoked. She has never used smokeless tobacco. She reports that she does not drink alcohol  and does not use drugs.  Allergies:  Allergies  Allergen Reactions   Metformin  And Related Nausea And Vomiting    Medications Prior to Admission  Medication Sig Dispense Refill   acetaminophen  (TYLENOL ) 325 MG tablet  Take 2 tablets (650 mg total) by mouth every 6 (six) hours as needed for mild pain, moderate pain, fever or headache. 60 tablet 0   ascorbic acid  (VITAMIN C ) 500 MG tablet Take 500 mg by mouth 2 (two) times daily.     aspirin  EC 81 MG tablet Take 81 mg by mouth daily. Swallow whole.     atorvastatin  (LIPITOR ) 80 MG tablet Take 1 tablet (80 mg total) by mouth daily. 30 tablet 0   bisacodyl  (DULCOLAX) 10 MG suppository Place 1 suppository (10 mg total) rectally daily as needed for  severe constipation.     cholecalciferol (VITAMIN D3) 25 MCG (1000 UNIT) tablet Take 5,000 Units by mouth daily.     clopidogrel  (PLAVIX ) 75 MG tablet Take 1 tablet (75 mg total) by mouth daily. 30 tablet 3   ferrous sulfate  220 (44 Fe) MG/5ML solution Take 220 mg by mouth every other day.     geriatric multivitamins-minerals (ELDERTONIC/GEVRABON) LIQD Take 15 mLs by mouth daily.     glipiZIDE (GLUCOTROL) 10 MG tablet Take 10 mg by mouth daily.     Glucagon , rDNA, (GLUCAGON  EMERGENCY) 1 MG KIT Inject 1 mg into the muscle once as needed (low blood sugar).     hydrALAZINE  (APRESOLINE ) 25 MG tablet Take 25 mg by mouth daily as needed (SBP> 160). PRN     insulin  aspart (NOVOLOG ) 100 UNIT/ML injection CBG 70 - 120: 0 units CBG 121 - 150: 2 units CBG 151 - 200: 3 units CBG 201 - 250: 5 units CBG 251 - 300: 8 units CBG 301 - 350: 11 units CBG 351 - 400: 15 units CBG > 400: call MD     magnesium  oxide (MAG-OX) 400 (240 Mg) MG tablet Take 400 mg by mouth daily.     naloxone (NARCAN) nasal spray 4 mg/0.1 mL Place 1 spray into the nose 3 (three) times daily as needed (opioid overodse).     oxyCODONE  (OXY IR/ROXICODONE ) 5 MG immediate release tablet Take 1 tablet (5 mg total) by mouth 3 (three) times daily as needed for severe pain (pain score 7-10). 15 tablet 0   polyethylene glycol (MIRALAX  / GLYCOLAX ) 17 g packet Take 17 g by mouth daily.     Amino Acids -Protein Hydrolys (FEEDING SUPPLEMENT, PRO-STAT SUGAR FREE 64,) LIQD Take 30 mLs by mouth in the morning and at bedtime.     cyanocobalamin  (VITAMIN B12) 1000 MCG tablet Take 1,000 mcg by mouth daily. (Patient not taking: Reported on 11/07/2023)     feeding supplement, GLUCERNA SHAKE, (GLUCERNA SHAKE) LIQD Take 237 mLs by mouth 3 (three) times daily between meals.     lisinopril  (ZESTRIL ) 20 MG tablet Take 20 mg by mouth daily. (Patient not taking: Reported on 11/07/2023)     Multiple Vitamins-Minerals (MULTIVITAMIN WITH MINERALS) tablet Take 1 tablet by mouth  daily. (Patient not taking: Reported on 11/07/2023)     Vitamin D , Ergocalciferol , (DRISDOL ) 1.25 MG (50000 UNIT) CAPS capsule Take 50,000 Units by mouth every 7 (seven) days. thursday (Patient not taking: Reported on 11/07/2023)      Physical Exam: Blood pressure (!) 127/46, pulse 76, temperature 98.7 F (37.1 C), temperature source Oral, resp. rate 18, height 5' 4 (1.626 m), weight 68.6 kg, SpO2 100%.  Constitutional: Patient appears of normal development and good nutritional status for stated age, well-groomed, and of normal body habitus. Phonating appropriately.   Psychiatric: Alert and oriented to time, place, person and situation. The patient is noted to have good judgment  and insight into the hospital visit  FOCUSED LOWER EXTREMITY EXAMINATION:   Neurological:  - Protective sensation diminished / absent - Gross protective sensation diminished bilaterally.  - No focal motor or sensory deficits identified bilaterally.   Vascular:  - Dorsalis Pedis artery on palpation:  - Posterior Tibial artery on palpation:  - Capillary Filling Times:  - Peripheral edema:  - Dependency Rubor:  - Varicosities:    Musculoskeletal:  Significantly contracted LEFT LE with hyperflexion of the knee.  Ankle Joint ROM: decreased, equinus noted Global foot - TTP: 2nd digit   Dermatological:  - Skin quality: atrophic  - Interdigital spaces: - left with heme and maceration to the 2nd and 3rd digit   #WOUND 1   Type: Arterial / ischemic ulceration Location: 2nd digit  --- Wound base: 10% Granular ; 10% Fibrosis ;80 % Necrotic  Adjacent tissue/ Wound borders: rolled   PTB: no  Malodor: yes   Active Drainage: no; If yes, consistency: n/as - there is serous maceration at unstable tissue which was removed and betadine  applied.           Results for orders placed or performed during the hospital encounter of 11/06/23 (from the past 48 hours)  Basic metabolic panel with GFR     Status:  Abnormal   Collection Time: 11/08/23  6:01 AM  Result Value Ref Range   Sodium 143 135 - 145 mmol/L   Potassium 3.7 3.5 - 5.1 mmol/L   Chloride 110 98 - 111 mmol/L   CO2 22 22 - 32 mmol/L   Glucose, Bld 147 (H) 70 - 99 mg/dL    Comment: Glucose reference range applies only to samples taken after fasting for at least 8 hours.   BUN 20 8 - 23 mg/dL   Creatinine, Ser 9.55 0.44 - 1.00 mg/dL   Calcium  8.3 (L) 8.9 - 10.3 mg/dL   GFR, Estimated >39 >39 mL/min    Comment: (NOTE) Calculated using the CKD-EPI Creatinine Equation (2021)    Anion gap 11 5 - 15    Comment: Performed at [facility], [address] [place], [place] [address]  CBC     Status: Abnormal   Collection Time: 11/08/23  6:01 AM  Result Value Ref Range   WBC 11.0 (H) 4.0 - 10.5 K/uL   RBC 2.29 (L) 3.87 - 5.11 MIL/uL   Hemoglobin 6.9 (L) 12.0 - 15.0 g/dL   HCT 77.7 (L) 63.9 - 53.9 %   MCV 96.9 80.0 - 100.0 fL   MCH 30.1 26.0 - 34.0 pg   MCHC 31.1 30.0 - 36.0 g/dL   RDW 86.7 88.4 - 84.4 %   Platelets 242 150 - 400 K/uL   nRBC 0.0 0.0 - 0.2 %    Comment: Performed at [facility], [address], [place], [place] [address]  Magnesium      Status: None   Collection Time: 11/08/23  6:01 AM  Result Value Ref Range   Magnesium  1.9 1.7 - 2.4 mg/dL    Comment: Performed at [facility], [address] [place], [place] [address]  Phosphorus     Status: Abnormal   Collection Time: 11/08/23  6:01 AM  Result Value Ref Range   Phosphorus 2.1 (L) 2.5 - 4.6 mg/dL    Comment: Performed at [facility], [address] [place], [place] [address]  Heparin  level (unfractionated)     Status: None   Collection Time: 11/08/23  6:01 AM  Result Value  Ref Range   Heparin  Unfractionated 0.39 0.30 - 0.70 IU/mL    Comment: (NOTE) The clinical reportable range upper limit is being lowered to >1.10 to align with the FDA approved guidance for the current laboratory assay.  If heparin  results are  below expected values, and patient dosage has  been confirmed, suggest follow up testing of antithrombin III levels. Performed at [facility], [address] [place], [place] [address]   Glucose, capillary     Status: Abnormal   Collection Time: 11/08/23  8:32 AM  Result Value Ref Range   Glucose-Capillary 128 (H) 70 - 99 mg/dL    Comment: Glucose reference range applies only to samples taken after fasting for at least 8 hours.  Type and screen [facility]     Status: None   Collection Time: 11/08/23  9:00 AM  Result Value Ref Range   ABO/RH(D) A POS    Antibody Screen NEG    Sample Expiration 11/11/2023,2359    Unit Number [id number]    Blood Component Type RED CELLS,LR    Unit division 00    Status of Unit ISSUED,FINAL    Transfusion Status OK TO TRANSFUSE    Crossmatch Result      Compatible Performed at [facility], [address], [place], [place] [address]   Prepare RBC (crossmatch)     Status: None   Collection Time: 11/08/23 11:49 AM  Result Value Ref Range   Order Confirmation      ORDER PROCESSED BY BLOOD BANK Performed at [facility], [address] [place], [place] [address]   Glucose, capillary     Status: Abnormal   Collection Time: 11/08/23 11:56 AM  Result Value Ref Range   Glucose-Capillary 162 (H) 70 - 99 mg/dL    Comment: Glucose reference range applies only to samples taken after fasting for at least 8 hours.  Heparin  level (unfractionated)     Status: Abnormal   Collection Time: 11/08/23  1:52 PM  Result Value Ref Range   Heparin  Unfractionated 0.22 (L) 0.30 - 0.70 IU/mL    Comment: (NOTE) The clinical reportable range upper limit is being lowered to >1.10 to align with the FDA approved guidance for the current laboratory assay.  If heparin  results are below expected values, and patient dosage has  been confirmed, suggest follow up testing of antithrombin III levels. Performed at [facility] [address] [place], [place] [address]   Glucose, capillary     Status: Abnormal   Collection Time: 11/08/23  4:45 PM  Result Value Ref Range   Glucose-Capillary 200 (H) 70 - 99 mg/dL    Comment: Glucose reference range applies only to samples taken after fasting for at least 8 hours.  Hemoglobin and hematocrit, blood     Status: Abnormal   Collection Time: 11/08/23  8:07 PM  Result Value Ref Range   Hemoglobin 8.6 (L) 12.0 - 15.0 g/dL   HCT 73.9 (L) 63.9 - 53.9 %    Comment: Performed at [facility], [address] [place], [place] [address]  Glucose, capillary     Status: Abnormal   Collection Time: 11/08/23  9:09 PM  Result Value Ref Range   Glucose-Capillary 138 (H) 70 - 99 mg/dL    Comment: Glucose reference range applies only to samples taken after fasting for at least 8 hours.  Basic metabolic panel with GFR     Status: Abnormal   Collection Time: 11/09/23  3:27 AM  Result Value Ref Range   Sodium 138 135 - 145 mmol/L   Potassium 4.1 3.5 - 5.1 mmol/L   Chloride 111 98 - 111 mmol/L   CO2 22 22 - 32 mmol/L   Glucose, Bld 114 (H) 70 - 99 mg/dL    Comment: Glucose reference range applies only to samples taken after fasting for at least 8 hours.   BUN 18 8 - 23 mg/dL   Creatinine, Ser 9.42 0.44 - 1.00 mg/dL   Calcium  7.8 (L) 8.9 - 10.3 mg/dL   GFR, Estimated >39 >39 mL/min    Comment: (NOTE) Calculated using the CKD-EPI Creatinine Equation (2021)    Anion gap 5 5 - 15    Comment: Performed at [facility], [address] [place], [place] [address]  CBC     Status: Abnormal   Collection Time: 11/09/23  3:27 AM  Result Value Ref Range   WBC 11.4 (H) 4.0 - 10.5 K/uL   RBC 2.81 (L) 3.87 - 5.11 MIL/uL   Hemoglobin 8.1 (L) 12.0 - 15.0 g/dL   HCT 74.2 (L) 63.9 - 53.9 %   MCV 91.5 80.0 - 100.0 fL   MCH 28.8 26.0 - 34.0 pg   MCHC 31.5 30.0 - 36.0 g/dL   RDW 83.5 (H) 88.4 - 84.4 %   Platelets 258 150 - 400 K/uL   nRBC 0.0 0.0 - 0.2 %     Comment: Performed at [facility], [address] [place], [place] [address]  Magnesium      Status: None   Collection Time: 11/09/23  3:27 AM  Result Value Ref Range   Magnesium  1.8 1.7 - 2.4 mg/dL    Comment: Performed at [facility], [address] [place], [place] [address]  Phosphorus     Status: None   Collection Time: 11/09/23  3:27 AM  Result Value Ref Range   Phosphorus 2.7 2.5 - 4.6 mg/dL    Comment: Performed at [facility], [address] [place], [place] [address]  Heparin  level (unfractionated)     Status: Abnormal   Collection Time: 11/09/23  3:27 AM  Result Value Ref Range   Heparin  Unfractionated <0.10 (L) 0.30 - 0.70 IU/mL    Comment: (NOTE) The clinical reportable range upper limit is being lowered to >1.10 to align with the FDA approved guidance for the current laboratory assay.  If heparin  results are below expected values, and patient dosage has  been confirmed, suggest follow up testing of antithrombin III levels. Performed at [facility], [address] [place], [place] [address]   Glucose, capillary     Status: Abnormal   Collection Time: 11/09/23  8:26 AM  Result Value Ref Range   Glucose-Capillary 114 (H) 70 - 99 mg/dL    Comment: Glucose reference range applies only to samples taken after fasting for at least 8 hours.  Glucose, capillary     Status: Abnormal   Collection Time: 11/09/23 11:53 AM  Result Value Ref Range   Glucose-Capillary 194 (H) 70 - 99 mg/dL    Comment: Glucose reference range applies only to samples taken after fasting for at least 8 hours.  Heparin  level (unfractionated)     Status: Abnormal   Collection Time: 11/09/23  2:11 PM  Result Value Ref Range   Heparin  Unfractionated <0.10 (L) 0.30 - 0.70 IU/mL    Comment: (NOTE) The clinical reportable range upper limit is being lowered to >1.10 to align with the  FDA approved guidance for the current laboratory assay.  If heparin   results are below expected values, and patient dosage has  been confirmed, suggest follow up testing of antithrombin III levels. Performed at [facility], [address] [place], [place] [address]   Hemoglobin and hematocrit, blood     Status: Abnormal   Collection Time: 11/09/23  2:11 PM  Result Value Ref Range   Hemoglobin 8.1 (L) 12.0 - 15.0 g/dL   HCT 74.8 (L) 63.9 - 53.9 %    Comment: Performed at [facility], [address] [place], [place] [address]  Glucose, capillary     Status: Abnormal   Collection Time: 11/09/23  4:45 PM  Result Value Ref Range   Glucose-Capillary 155 (H) 70 - 99 mg/dL    Comment: Glucose reference range applies only to samples taken after fasting for at least 8 hours.  Glucose, capillary     Status: Abnormal   Collection Time: 11/09/23  9:53 PM  Result Value Ref Range   Glucose-Capillary 179 (H) 70 - 99 mg/dL    Comment: Glucose reference range applies only to samples taken after fasting for at least 8 hours.   DG Chest Port 1 View Result Date: 11/09/2023 CLINICAL DATA:  Pulmonary edema. EXAM: PORTABLE CHEST 1 VIEW COMPARISON:  Chest radiograph dated 08/07/2022. FINDINGS: No focal consolidation, pleural effusion or pneumothorax. The cardiac silhouette is within normal limits. Coronary vascular calcification as well as atherosclerotic calcification of the aortic arch. No acute osseous pathology. Severe right shoulder arthritic changes. IMPRESSION: 1. No active disease. 2. Atherosclerotic disease. Electronically Signed   By: Vanetta Chou M.D.   On: 11/09/2023 18:37   US  Lower Ext Art Right Ltd Result Date: 11/08/2023 CLINICAL DATA:  Anemia after left lower extremity arteriography after right common femoral artery access on 11/06/2023. EXAM: RIGHT LOWER EXTREMITY ARTERIAL DUPLEX SCAN TECHNIQUE: Gray-scale sonography as well as color Doppler and duplex ultrasound was performed to evaluate the lower extremity arteries at the level of the  right groin. COMPARISON:  None Available. FINDINGS: Right common femoral artery is normally patent and demonstrates atherosclerosis. No evidence of pseudoaneurysm or dissection. Normal triphasic arterial waveform obtained within the common femoral artery. Proximal profunda femoral artery and proximal superficial femoral arteries demonstrate normal patency and duplex waveforms. No evidence of AV fistula or significant superficial hematoma/hemorrhage in the right groin. IMPRESSION: No evidence of right femoral artery pseudoaneurysm, dissection, AV fistula or significant hematoma/hemorrhage. Electronically Signed   By: Marcey Moan M.D.   On: 11/08/2023 11:20     Imaging: XRAY recommended Date: 11/10/2023 Department: [facility]TH[facility] (1A) Released By/Authorizing: Tanda Greig MATSU, DPM (auto-released)   Exam Status  Status  Final [99]   PACS Intelerad Image Link   Show images for DG Foot Complete Left Study Result  Narrative & Impression  CLINICAL DATA:  Gangrene.   EXAM: LEFT FOOT - COMPLETE 3+ VIEW   COMPARISON:  Foot radiograph 08/26/2022   FINDINGS: Previous transmetatarsal amputation of the first ray. Previous resection of the fifth ray. The bones are diffusely under mineralized. Hammertoe deformity of the remaining toes. No obvious erosion or bony destructive change. No acute fracture. Prominent vascular calcifications. Generalized soft tissue edema. No soft tissue gas. A dressing/artifact overlies the hindfoot.   IMPRESSION: 1. Previous transmetatarsal amputation of the first ray. Previous resection of the fifth ray. 2. No radiographic findings of osteomyelitis. Osteopenia/osteoporosis limits assessment. 3. Generalized soft tissue edema. No soft tissue gas.  Assessment  - ISCHEMIC ulceration to the LEFT foot  - Gangrene, left 2nd digit    Plan  XR with no acute osseous changes at areas of concern. No acute subcutaneous emphysema  noted. There is a stable distal eschar cap that is clinically correlates to distal phalanx (from XR imaging) coverage - just proximal and plantar to this point is dusky appearing tissue that has not fully demarcated yet. Reperfusion edema to the foot and mild dried recent heme noted is expected for the recent vascular procedure.   Given patient limited ambulation and absence of acute osseous or soft tissue changes would advise continued wound care and stabilization of the wounds (betadine  to dry out and cleanse the area) - it is possible that the vascular intervention conducted may assist in the chronic wound healing. Should acute changes occur, podiatry happy to assist with any needed infection control measures.  DRESSINGS:  - Daily betadine  paint to the wound sites on the LEFT foot  - Okay to leave open to air current on the L 2nd digit (no constrictive dressings to digits).  - Heel offloading and pressure offloading measures in place.  - Patient has follow up established with wound care on 11/20/23    Marquite Attwood KANDICE Blush 11/10/2023, 12:05 AM

## 2023-11-10 NOTE — Plan of Care (Signed)
  Problem: Education: Goal: Knowledge of General Education information will improve Description: Including pain rating scale, medication(s)/side effects and non-pharmacologic comfort measures Outcome: Progressing   Problem: Clinical Measurements: Goal: Ability to maintain clinical measurements within normal limits will improve Outcome: Progressing   Problem: Elimination: Goal: Will not experience complications related to bowel motility Outcome: Progressing Goal: Will not experience complications related to urinary retention Outcome: Progressing   Problem: Safety: Goal: Ability to remain free from injury will improve Outcome: Progressing   Problem: Metabolic: Goal: Ability to maintain appropriate glucose levels will improve Outcome: Progressing

## 2023-11-11 DIAGNOSIS — I7025 Atherosclerosis of native arteries of other extremities with ulceration: Secondary | ICD-10-CM | POA: Diagnosis not present

## 2023-11-11 DIAGNOSIS — L97509 Non-pressure chronic ulcer of other part of unspecified foot with unspecified severity: Secondary | ICD-10-CM | POA: Diagnosis not present

## 2023-11-11 LAB — CULTURE, BLOOD (ROUTINE X 2)
Culture: NO GROWTH
Culture: NO GROWTH
Special Requests: ADEQUATE

## 2023-11-11 LAB — CBC
HCT: 25.6 % — ABNORMAL LOW (ref 36.0–46.0)
Hemoglobin: 8.1 g/dL — ABNORMAL LOW (ref 12.0–15.0)
MCH: 29.3 pg (ref 26.0–34.0)
MCHC: 31.6 g/dL (ref 30.0–36.0)
MCV: 92.8 fL (ref 80.0–100.0)
Platelets: 264 K/uL (ref 150–400)
RBC: 2.76 MIL/uL — ABNORMAL LOW (ref 3.87–5.11)
RDW: 15.6 % — ABNORMAL HIGH (ref 11.5–15.5)
WBC: 10 K/uL (ref 4.0–10.5)
nRBC: 0 % (ref 0.0–0.2)

## 2023-11-11 LAB — BASIC METABOLIC PANEL WITH GFR
Anion gap: 7 (ref 5–15)
BUN: 24 mg/dL — ABNORMAL HIGH (ref 8–23)
CO2: 20 mmol/L — ABNORMAL LOW (ref 22–32)
Calcium: 8.2 mg/dL — ABNORMAL LOW (ref 8.9–10.3)
Chloride: 111 mmol/L (ref 98–111)
Creatinine, Ser: 0.55 mg/dL (ref 0.44–1.00)
GFR, Estimated: >60 mL/min (ref >60)
Glucose, Bld: 172 mg/dL — ABNORMAL HIGH (ref 70–99)
Potassium: 4.7 mmol/L (ref 3.5–5.1)
Sodium: 138 mmol/L (ref 135–145)

## 2023-11-11 LAB — GLUCOSE, CAPILLARY
Glucose-Capillary: 150 mg/dL — ABNORMAL HIGH (ref 70–99)
Glucose-Capillary: 143 mg/dL — ABNORMAL HIGH (ref 70–99)
Glucose-Capillary: 143 mg/dL — ABNORMAL HIGH (ref 70–99)

## 2023-11-11 NOTE — TOC Transition Note (Signed)
 Transition of Care [facility]) - Discharge Note   Patient Details  Name: [name] MRN: [id number] Date of Birth: [birth year]  Transition of [facility]) CM/SW Contact:  Alvaro Louder, LCSW Phone Number: 11/11/2023, 4:38 PM   Clinical Narrative:  Patient to return to [facility]  LCSWA confirmed with MD that patient is stable for discharge. LCSWA notified the Legal Guardian and they are in agreement with discharge. LCSWA confirmed bed is available at [facility]. Transport arranged with [facility] for next available.   RM 103A, Number to call report: [phone]   [facility] Signing off  Final next level of care: Long Term Nursing Home Barriers to Discharge: No Barriers Identified   Patient Goals and CMS Choice            Discharge Placement              Patient chooses bed at: [facility] [place] Patient to be transferred to facility by: [facility] Name of family member notified: Rubie Moats Patient and family notified of of transfer: 11/11/23  Discharge Plan and Services Additional resources added to the After Visit Summary for                                       Social Drivers of Health (SDOH) Interventions SDOH Screenings   Food Insecurity: No Food Insecurity (11/06/2023)  Housing: Low Risk  (11/06/2023)  Transportation Needs: No Transportation Needs (11/06/2023)  Utilities: Not At Risk (11/06/2023)  Social Connections: Patient Unable To Answer (11/06/2023)  Tobacco Use: Low Risk  (11/06/2023)     Readmission Risk Interventions    05/01/2023    4:26 PM  Readmission Risk Prevention Plan  Transportation Screening Complete  HRI or Home Care Consult Complete  Social Work Consult for Recovery Care Planning/Counseling Complete  Medication Review Oceanographer) Complete

## 2023-11-11 NOTE — Plan of Care (Signed)
                                                     Palliative Care Progress Note   Patient Name: [name]       Date: 11/11/2023 DOB: 1933-03-15  Age: 89 y.o. MRN#: [id number] Attending Physician: Dezii, Alexandra, DO Primary Care Physician: Rudolpho Norleen BIRCH, MD Admit Date: 11/06/2023  Extensive chart review completed including labs, vital signs, imaging, progress notes, orders, and available advanced directive documents from current and previous encounters.   PMT is monitoring the patient peripherally and will re-engage where appropriate. No acute palliative needs today.   PMT remains available to patient and family. Please engage with PMT if goals change, at patient/family's request, or if patient's health deteriorates during hospitalization.   Thank you for allowing the Palliative Medicine Team to assist in the care of [name].  Lamarr L. Arvid, DNP, FNP-BC Palliative Medicine Team    No charge

## 2023-11-11 NOTE — Progress Notes (Signed)
 Report given to Lamanda (LPN), [employer] [place] on [phone] . Handed over patient belongings. Awaiting transport.

## 2023-11-11 NOTE — Discharge Summary (Signed)
 DISCHARGE SUMMARY    [name] FMW:[id number] DOB: 1933-02-18 DOA: 11/06/2023  PCP: Rudolpho Norleen BIRCH, MD  Admit date: 11/06/2023 Discharge date: 11/11/2023   Recommendations for Outpatient Follow-up:  Follow up with PCP in 1-2 weeks to review chronic condition management Follow-up with podiatry and wound care in clinic Vascular surgery follow-up in the clinic  Hospital Course: [name] is an 89 year old female with hypertension, hyperlipidemia, diabetes, CAD, PAD, CVA with left hemiparesis, dementia, hyperaldosteronism, chronic wounds, who presented from wound care due to wound changes to the left second toe, and concerns for ischemic changes.  Initial vitals revealed hypertension.  Labs stable.  Patient was admitted and started on antibiotics and heparin .  Vascular surgery was consulted. On 9/26 patient underwent successful recanalization of the left lower extremity for limb salvage.  # PAD # Critical left lower extremity ischemia # Chronic lower extremity wound Vascular surgery consulted S/p angioplasty left lower extremity done on 9/26 No concern of acute infection.  No further antibiotics needed wound consult Podiatry evaluated the patient, wound care recommendations made.  No osteo at this time. Wound RN consulted Blood cultures remain negative Heparin  discontinued Continue with DAPT 9/27 Statin Has not required opioid therapy for pain control 9/26 angioplasty left lower extremity 9/28 Right Femoral artery duplex: No evidence of hemorrhage, hematoma or pseudoaneurysm.    # Acute blood loss anemia, post-op 9/28 Hb 6.9, transfuse 1 unit of PRBC Hemoglobin subsequently stable. Repeat CBC in 1 week  # Hypertension # Hyperlipidemia Continue home meds   # Diabetes:  HbA1c 5.9 prediabetic range Given advanced age can liberalize hemoglobin A1c goal.  Discontinue sulfonylurea due to high risk hypoglycemia   # Carotid artery disease Home meds   #  Dementia - Pleasantly demented - DSS guardian    # History of CVA Resdiual Left hemiparesis . - Continue atorvastatin  - s/p Heparin  gtt, continue aspirin  and Plavix .     Body mass index is 25.96 kg/m.    Discharge Instructions  Discharge Instructions     Call MD for:  difficulty breathing, headache or visual disturbances   Complete by: As directed    Call MD for:  persistant dizziness or light-headedness   Complete by: As directed    Call MD for:  persistant nausea and vomiting   Complete by: As directed    Call MD for:  severe uncontrolled pain   Complete by: As directed    Call MD for:  temperature >100.4   Complete by: As directed    Diet general   Complete by: As directed    Discharge instructions   Complete by: As directed    - Patient has follow up established with wound care on 11/20/23 - Follow up with your primary care physician to discuss the medication changes during this admission   Discharge wound care:   Complete by: As directed    Cleanse L heel wound with NS, Apply 1/4 thick layer of Santyl to wound bed, top with saline moist gauze, top with dry gauze and secure with silicone foam or Kerlix roll gauze whichever is preferred. Place L foot in prevalon boot to offload pressure   Wound care  2 times daily      Paint L 2nd digit wound with Betadine  2 times daily and leave open to air or cover with dry gauze and tape   Increase activity slowly   Complete by: As directed       Allergies as of 11/11/2023  Reactions   Metformin  And Related Nausea And Vomiting        Medication List     STOP taking these medications    glipiZIDE 10 MG tablet Commonly known as: GLUCOTROL   multivitamin with minerals tablet   Vitamin D  (Ergocalciferol ) 1.25 MG (50000 UNIT) Caps capsule Commonly known as: DRISDOL        TAKE these medications    acetaminophen  325 MG tablet Commonly known as: TYLENOL  Take 2 tablets (650 mg total) by mouth every 6 (six)  hours as needed for mild pain, moderate pain, fever or headache.   ascorbic acid  500 MG tablet Commonly known as: VITAMIN C  Take 500 mg by mouth 2 (two) times daily.   aspirin  EC 81 MG tablet Take 81 mg by mouth daily. Swallow whole.   atorvastatin  80 MG tablet Commonly known as: LIPITOR  Take 1 tablet (80 mg total) by mouth daily.   bisacodyl  10 MG suppository Commonly known as: DULCOLAX Place 1 suppository (10 mg total) rectally daily as needed for severe constipation.   cholecalciferol 25 MCG (1000 UNIT) tablet Commonly known as: VITAMIN D3 Take 5,000 Units by mouth daily.   clopidogrel  75 MG tablet Commonly known as: PLAVIX  Take 1 tablet (75 mg total) by mouth daily.   cyanocobalamin  1000 MCG tablet Commonly known as: VITAMIN B12 Take 1,000 mcg by mouth daily.   feeding supplement (GLUCERNA SHAKE) Liqd Take 237 mLs by mouth 3 (three) times daily between meals.   feeding supplement (PRO-STAT SUGAR FREE 64) Liqd Take 30 mLs by mouth in the morning and at bedtime.   ferrous sulfate  220 (44 Fe) MG/5ML solution Take 220 mg by mouth every other day.   geriatric multivitamins-minerals Liqd Take 15 mLs by mouth daily.   Glucagon  Emergency 1 MG Kit Inject 1 mg into the muscle once as needed (low blood sugar).   hydrALAZINE  25 MG tablet Commonly known as: APRESOLINE  Take 25 mg by mouth daily as needed (SBP> 160). PRN   insulin  aspart 100 UNIT/ML injection Commonly known as: novoLOG  CBG 70 - 120: 0 units CBG 121 - 150: 2 units CBG 151 - 200: 3 units CBG 201 - 250: 5 units CBG 251 - 300: 8 units CBG 301 - 350: 11 units CBG 351 - 400: 15 units CBG > 400: call MD   lisinopril  20 MG tablet Commonly known as: ZESTRIL  Take 20 mg by mouth daily.   magnesium  oxide 400 (240 Mg) MG tablet Commonly known as: MAG-OX Take 400 mg by mouth daily.   naloxone 4 MG/0.1ML Liqd nasal spray kit Commonly known as: NARCAN Place 1 spray into the nose 3 (three) times daily as needed  (opioid overodse).   oxyCODONE  5 MG immediate release tablet Commonly known as: Oxy IR/ROXICODONE  Take 1 tablet (5 mg total) by mouth 3 (three) times daily as needed for severe pain (pain score 7-10).   polyethylene glycol 17 g packet Commonly known as: MIRALAX  / GLYCOLAX  Take 17 g by mouth daily.               Discharge Care Instructions  (From admission, onward)           Start     Ordered   11/11/23 0000  Discharge wound care:       Comments: Cleanse L heel wound with NS, Apply 1/4 thick layer of Santyl to wound bed, top with saline moist gauze, top with dry gauze and secure with silicone foam or Kerlix roll gauze whichever is  preferred. Place L foot in prevalon boot to offload pressure   Wound care  2 times daily      Paint L 2nd digit wound with Betadine  2 times daily and leave open to air or cover with dry gauze and tape   11/11/23 1311            Allergies  Allergen Reactions   Metformin  And Related Nausea And Vomiting    Consultations: Treatment Team:  Jama Cordella MATSU, MD Tanda Greig MATSU, DPM   Procedures/Studies: DG Foot Complete Left Result Date: 11/10/2023 CLINICAL DATA:  Gangrene. EXAM: LEFT FOOT - COMPLETE 3+ VIEW COMPARISON:  Foot radiograph 08/26/2022 FINDINGS: Previous transmetatarsal amputation of the first ray. Previous resection of the fifth ray. The bones are diffusely under mineralized. Hammertoe deformity of the remaining toes. No obvious erosion or bony destructive change. No acute fracture. Prominent vascular calcifications. Generalized soft tissue edema. No soft tissue gas. A dressing/artifact overlies the hindfoot. IMPRESSION: 1. Previous transmetatarsal amputation of the first ray. Previous resection of the fifth ray. 2. No radiographic findings of osteomyelitis. Osteopenia/osteoporosis limits assessment. 3. Generalized soft tissue edema. No soft tissue gas. Electronically Signed   By: Andrea Gasman M.D.   On: 11/10/2023 17:12   DG  Chest Port 1 View Result Date: 11/09/2023 CLINICAL DATA:  Pulmonary edema. EXAM: PORTABLE CHEST 1 VIEW COMPARISON:  Chest radiograph dated 08/07/2022. FINDINGS: No focal consolidation, pleural effusion or pneumothorax. The cardiac silhouette is within normal limits. Coronary vascular calcification as well as atherosclerotic calcification of the aortic arch. No acute osseous pathology. Severe right shoulder arthritic changes. IMPRESSION: 1. No active disease. 2. Atherosclerotic disease. Electronically Signed   By: Vanetta Chou M.D.   On: 11/09/2023 18:37   US  Lower Ext Art Right Ltd Result Date: 11/08/2023 CLINICAL DATA:  Anemia after left lower extremity arteriography after right common femoral artery access on 11/06/2023. EXAM: RIGHT LOWER EXTREMITY ARTERIAL DUPLEX SCAN TECHNIQUE: Gray-scale sonography as well as color Doppler and duplex ultrasound was performed to evaluate the lower extremity arteries at the level of the right groin. COMPARISON:  None Available. FINDINGS: Right common femoral artery is normally patent and demonstrates atherosclerosis. No evidence of pseudoaneurysm or dissection. Normal triphasic arterial waveform obtained within the common femoral artery. Proximal profunda femoral artery and proximal superficial femoral arteries demonstrate normal patency and duplex waveforms. No evidence of AV fistula or significant superficial hematoma/hemorrhage in the right groin. IMPRESSION: No evidence of right femoral artery pseudoaneurysm, dissection, AV fistula or significant hematoma/hemorrhage. Electronically Signed   By: Marcey Moan M.D.   On: 11/08/2023 11:20   PERIPHERAL VASCULAR CATHETERIZATION Result Date: 11/06/2023 See surgical note for result.     Discharge Exam: Vitals:   11/11/23 0800 11/11/23 0823  BP: (!) 144/55 (!) 144/60  Pulse: 73 74  Resp: 18   Temp: 98.2 F (36.8 C)   SpO2: 100% 100%   Vitals:   11/10/23 2022 11/11/23 0326 11/11/23 0800 11/11/23 0823  BP:  90/69 (!) 153/58 (!) 144/55 (!) 144/60  Pulse: 75 79 73 74  Resp:  17 18   Temp:  98.9 F (37.2 C) 98.2 F (36.8 C)   TempSrc:  Oral Oral   SpO2:  100% 100% 100%  Weight:      Height:        Constitutional:  Normal appearance. Non toxic-appearing.  HENT: Head Normocephalic and atraumatic.  Mucous membranes are moist.  Eyes:  Extraocular intact. Conjunctivae normal.  Cardiovascular: Rate and Rhythm: Normal  rate and regular rhythm.  Pulmonary: Non labored, symmetric rise of chest wall.  Neurological: Pleasantly confused, intermittently disoriented, speech incoherent at times   The results of significant diagnostics from this hospitalization (including imaging, microbiology, ancillary and laboratory) are listed below for reference.     Microbiology: Recent Results (from the past 240 hours)  Blood culture (routine x 2)     Status: None   Collection Time: 11/06/23 11:26 AM   Specimen: BLOOD  Result Value Ref Range Status   Specimen Description BLOOD BLOOD RIGHT ARM  Final   Special Requests   Final    BOTTLES DRAWN AEROBIC AND ANAEROBIC Blood Culture adequate volume   Culture   Final    NO GROWTH 5 DAYS Performed at [facility], [address] [place], [place] [address]    Report Status 11/11/2023 FINAL  Final  Blood culture (routine x 2)     Status: None   Collection Time: 11/06/23 12:05 PM   Specimen: BLOOD  Result Value Ref Range Status   Specimen Description BLOOD BLOOD RIGHT FOREARM  Final   Special Requests   Final    BOTTLES DRAWN AEROBIC AND ANAEROBIC Blood Culture results may not be optimal due to an inadequate volume of blood received in culture bottles   Culture   Final    NO GROWTH 5 DAYS Performed at [facility], [address] [place], [place] [address]    Report Status 11/11/2023 FINAL  Final     Labs: BNP (last 3 results) No results for input(s): BNP in the last 8760 hours. Basic Metabolic Panel: Recent Labs  Lab  11/07/23 0256 11/08/23 0601 11/09/23 0327 11/10/23 0545 11/11/23 0536  NA 141 143 138 140 138  K 3.6 3.7 4.1 4.0 4.7  CL 111 110 111 110 111  CO2 24 22 22 22  20*  GLUCOSE 121* 147* 114* 166* 172*  BUN 18 20 18 22  24*  CREATININE 0.55 0.44 0.57 0.79 0.55  CALCIUM  8.1* 8.3* 7.8* 8.1* 8.2*  MG  --  1.9 1.8 1.7  --   PHOS  --  2.1* 2.7 2.5  --    Liver Function Tests: Recent Labs  Lab 11/06/23 1037 11/07/23 0256  AST 30 20  ALT 11 10  ALKPHOS 67 54  BILITOT 0.8 0.6  PROT 6.7 5.5*  ALBUMIN 3.0* 2.5*   No results for input(s): LIPASE, AMYLASE in the last 168 hours. No results for input(s): AMMONIA in the last 168 hours. CBC: Recent Labs  Lab 11/07/23 0256 11/07/23 1414 11/08/23 0601 11/08/23 2007 11/09/23 0327 11/09/23 1411 11/10/23 0545 11/11/23 0536  WBC 8.5  --  11.0*  --  11.4*  --  9.8 10.0  HGB 7.8*   < > 6.9* 8.6* 8.1* 8.1* 8.3* 8.1*  HCT 24.6*   < > 22.2* 26.0* 25.7* 25.1* 26.4* 25.6*  MCV 96.9  --  96.9  --  91.5  --  92.6 92.8  PLT 252  --  242  --  258  --  260 264   < > = values in this interval not displayed.   Cardiac Enzymes: No results for input(s): CKTOTAL, CKMB, CKMBINDEX, TROPONINI in the last 168 hours. BNP: Invalid input(s): POCBNP CBG: Recent Labs  Lab 11/10/23 1142 11/10/23 1618 11/10/23 2020 11/11/23 0810 11/11/23 1151  GLUCAP 173* 138* 180* 150* 143*   D-Dimer No results for input(s): DDIMER in the last 72 hours. Hgb A1c No results for input(s): HGBA1C in the  last 72 hours. Lipid Profile No results for input(s): CHOL, HDL, LDLCALC, TRIG, CHOLHDL, LDLDIRECT in the last 72 hours. Thyroid  function studies No results for input(s): TSH, T4TOTAL, T3FREE, THYROIDAB in the last 72 hours.  Invalid input(s): FREET3 Anemia work up No results for input(s): VITAMINB12, FOLATE, FERRITIN, TIBC, IRON, RETICCTPCT in the last 72 hours. Urinalysis    Component Value Date/Time    COLORURINE YELLOW (A) 05/03/2023 1329   APPEARANCEUR HAZY (A) 05/03/2023 1329   LABSPEC 1.020 05/03/2023 1329   PHURINE 5.0 05/03/2023 1329   GLUCOSEU NEGATIVE 05/03/2023 1329   HGBUR NEGATIVE 05/03/2023 1329   BILIRUBINUR NEGATIVE 05/03/2023 1329   BILIRUBINUR neg 08/08/2019 0922   KETONESUR NEGATIVE 05/03/2023 1329   PROTEINUR NEGATIVE 05/03/2023 1329   UROBILINOGEN 0.2 08/08/2019 0922   NITRITE NEGATIVE 05/03/2023 1329   LEUKOCYTESUR TRACE (A) 05/03/2023 1329   Sepsis Labs Recent Labs  Lab 11/08/23 0601 11/09/23 0327 11/10/23 0545 11/11/23 0536  WBC 11.0* 11.4* 9.8 10.0   Microbiology Recent Results (from the past 240 hours)  Blood culture (routine x 2)     Status: None   Collection Time: 11/06/23 11:26 AM   Specimen: BLOOD  Result Value Ref Range Status   Specimen Description BLOOD BLOOD RIGHT ARM  Final   Special Requests   Final    BOTTLES DRAWN AEROBIC AND ANAEROBIC Blood Culture adequate volume   Culture   Final    NO GROWTH 5 DAYS Performed at [facility], [address] [place], [place] [address]    Report Status 11/11/2023 FINAL  Final  Blood culture (routine x 2)     Status: None   Collection Time: 11/06/23 12:05 PM   Specimen: BLOOD  Result Value Ref Range Status   Specimen Description BLOOD BLOOD RIGHT FOREARM  Final   Special Requests   Final    BOTTLES DRAWN AEROBIC AND ANAEROBIC Blood Culture results may not be optimal due to an inadequate volume of blood received in culture bottles   Culture   Final    NO GROWTH 5 DAYS Performed at [facility], [address] [place], [place] [address]    Report Status 11/11/2023 FINAL  Final     Time coordinating discharge: 32 min   SIGNED: Ryman Rathgeber, DO [facility] 11/11/2023, 1:11 PM Pager   If 7PM-7AM, please contact night-coverage

## 2023-11-11 NOTE — NC FL2 (Signed)
 [place]  MEDICAID [id number] LEVEL OF CARE FORM     IDENTIFICATION  Patient Name: [name] Birthdate: January 22, 1934 Sex: female Admission Date (Current Location): 11/06/2023  [place] and [place] Number:  Chiropodist and Address:  [facility], [address] [place], [place] [address]      Provider Number: [id number]  Attending Physician Name and Address:  Leesa Kast, DO  Relative Name and Phone Number:       Current Level of Care: Hospital Recommended Level of Care: Other (Comment) (LTC) Prior Approval Number:    Date Approved/Denied:   PASRR Number: [id number]  Discharge Plan: Other (Comment) (LTC)    Current Diagnoses: Patient Active Problem List   Diagnosis Date Noted   Critical limb ischemia of left lower extremity (HCC) 11/06/2023   Ischemic foot ulcer due to atherosclerosis of native artery of limb (HCC) 11/06/2023   Protein-calorie malnutrition, severe 04/30/2023   Toe gangrene (HCC) 04/28/2023   Dementia (HCC) 04/09/2023   DJD (degenerative joint disease) 01/21/2023   Ischemia of left lower extremity 08/27/2022   Osteomyelitis of left foot (HCC) 08/27/2022   Gangrene of toe of left foot (HCC) 08/26/2022   Acute osteomyelitis of left foot (HCC) 08/08/2022   Diabetic foot infection (HCC) 08/07/2022   Hemiplegia affecting left side in left-dominant patient as late effect of cerebrovascular disease (HCC) 01/10/2022   Vaginal candidiasis 01/10/2022   Leg wound, left 01/08/2022   Hypoglycemia 01/07/2022   Leukocytosis 01/07/2022   MRI contraindicated due to metal implant 01/07/2022   Open wound of left foot 01/07/2022   Atherosclerosis of native arteries of the extremities with ulceration (HCC) 06/06/2021   Pressure injury of skin 06/01/2021   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   PAD (peripheral artery disease) 05/17/2021   Right hemiparesis (HCC) 05/16/2021   Cellulitis of left foot 05/15/2021   Hypokalemia 05/15/2021    History of stroke 10/07/2019   Altered mental status 10/06/2019   Carotid artery disease 09/28/2019   Left hemiparesis (HCC) 09/28/2019   Hemiplegia and hemiparesis following cerebral infarction affecting right dominant side (HCC) 09/27/2019   Primary osteoarthritis of both hips 11/06/2017   Microalbuminuria 11/02/2017   Cystocele, midline 09/04/2017   Incomplete uterine prolapse 09/04/2017   Ankle edema 04/27/2017   Malnutrition of moderate degree 01/15/2016   Dehydration    Incarcerated ventral hernia    Diabetic ketoacidosis without coma associated with type 2 diabetes mellitus (HCC)    Type 2 diabetes mellitus with polyneuropathy (HCC) 08/06/2015   Carpal tunnel syndrome, bilateral 07/10/2015   Osteopenia of multiple sites 07/10/2015   Arthralgia of multiple sites 06/27/2015   Mixed hyperlipidemia 11/06/2014   Hypomagnesemia 11/02/2013   Essential hypertension 11/02/2013    Orientation RESPIRATION BLADDER Height & Weight     Self  Normal Indwelling catheter Weight: 151 lb 3.8 oz (68.6 kg) Height:  5' 4 (162.6 cm)  BEHAVIORAL SYMPTOMS/MOOD NEUROLOGICAL BOWEL NUTRITION STATUS      Incontinent Diet (Carb Modified)  AMBULATORY STATUS COMMUNICATION OF NEEDS Skin   Extensive Assist Verbally                         Personal Care Assistance Level of Assistance  Bathing, Feeding, Dressing Bathing Assistance: Maximum assistance Feeding assistance: Maximum assistance Dressing Assistance: Maximum assistance     Functional Limitations Info  Sight, Hearing, Speech Sight Info: Impaired Hearing Info: Adequate Speech Info: Adequate    SPECIAL CARE FACTORS FREQUENCY  Contractures      Additional Factors Info  Code Status, Allergies Code Status Info: DNR Allergies Info: Metformin  And Related           Current Medications (11/11/2023):  This is the current hospital active medication list Current Facility-Administered Medications   Medication Dose Route Frequency Provider Last Rate Last Admin   acetaminophen  (TYLENOL ) tablet 650 mg  650 mg Oral Q6H PRN Seena Marsa NOVAK, MD   650 mg at 11/10/23 1447   Or   acetaminophen  (TYLENOL ) suppository 650 mg  650 mg Rectal Q6H PRN Seena Marsa NOVAK, MD       aspirin  EC tablet 81 mg  81 mg Oral Daily Von Bellis, MD   81 mg at 11/11/23 9092   atorvastatin  (LIPITOR ) tablet 80 mg  80 mg Oral QHS Melvin, Alexander B, MD   80 mg at 11/10/23 2111   clopidogrel  (PLAVIX ) tablet 75 mg  75 mg Oral Daily Melvin, Alexander B, MD   75 mg at 11/11/23 9092   collagenase (SANTYL) ointment   Topical Daily Von Bellis, MD   Given at 11/11/23 9092   diphenhydrAMINE  (BENADRYL ) injection 50 mg  50 mg Intravenous Once PRN Seena Marsa NOVAK, MD       enoxaparin  (LOVENOX ) injection 40 mg  40 mg Subcutaneous QPM Von Bellis, MD   40 mg at 11/10/23 2111   famotidine  (PEPCID ) tablet 40 mg  40 mg Oral Once PRN Seena Marsa NOVAK, MD       feeding supplement (GLUCERNA SHAKE) (GLUCERNA SHAKE) liquid 237 mL  237 mL Oral TID BM Melvin, Alexander B, MD   237 mL at 11/11/23 1215   hydrALAZINE  (APRESOLINE ) injection 10 mg  10 mg Intravenous Q6H PRN Von Bellis, MD       Or   hydrALAZINE  (APRESOLINE ) tablet 50 mg  50 mg Oral Q6H PRN Von Bellis, MD       insulin  aspart (novoLOG ) injection 0-15 Units  0-15 Units Subcutaneous TID WC Von Bellis, MD   2 Units at 11/11/23 1215   ipratropium-albuterol  (DUONEB) 0.5-2.5 (3) MG/3ML nebulizer solution 3 mL  3 mL Nebulization Q6H PRN Von Bellis, MD       lisinopril  (ZESTRIL ) tablet 20 mg  20 mg Oral Daily Von Bellis, MD   20 mg at 11/11/23 9092   methylPREDNISolone  sodium succinate (SOLU-MEDROL ) 125 mg/2 mL injection 125 mg  125 mg Intravenous Once PRN Seena Marsa NOVAK, MD       midazolam  (VERSED ) 2 MG/ML syrup 8 mg  8 mg Oral Once PRN Melvin, Alexander B, MD       polyethylene glycol (MIRALAX  / GLYCOLAX ) packet 17 g  17 g Oral Daily PRN Seena Marsa NOVAK, MD       sodium chloride  flush (NS) 0.9 % injection 3 mL  3 mL Intravenous Q12H Melvin, Alexander B, MD   3 mL at 11/11/23 0908     Discharge Medications: Please see discharge summary for a list of discharge medications.  Relevant Imaging Results:  Relevant Lab Results:   Additional Information SSN: [id number]  Jesica Goheen  [place], [place]

## 2023-11-11 NOTE — Plan of Care (Signed)
  Problem: Education: Goal: Knowledge of General Education information will improve Description: Including pain rating scale, medication(s)/side effects and non-pharmacologic comfort measures Outcome: Progressing   Problem: Clinical Measurements: Goal: Ability to maintain clinical measurements within normal limits will improve Outcome: Progressing   Problem: Activity: Goal: Risk for activity intolerance will decrease Outcome: Progressing   Problem: Coping: Goal: Level of anxiety will decrease Outcome: Progressing   Problem: Pain Managment: Goal: General experience of comfort will improve and/or be controlled Outcome: Progressing   Problem: Safety: Goal: Ability to remain free from injury will improve Outcome: Progressing   Problem: Metabolic: Goal: Ability to maintain appropriate glucose levels will improve Outcome: Progressing

## 2023-11-20 ENCOUNTER — Ambulatory Visit: Admitting: Physician Assistant

## 2023-12-04 ENCOUNTER — Inpatient Hospital Stay

## 2023-12-04 ENCOUNTER — Emergency Department

## 2023-12-04 ENCOUNTER — Inpatient Hospital Stay
Admission: EM | Admit: 2023-12-04 | Discharge: 2023-12-12 | DRG: 239 | Disposition: A | Discharge status: Skilled Nursing Facility | Attending: Internal Medicine | Admitting: Internal Medicine

## 2023-12-04 ENCOUNTER — Other Ambulatory Visit: Payer: Self-pay

## 2023-12-04 ENCOUNTER — Encounter: Attending: Physician Assistant | Admitting: Physician Assistant

## 2023-12-04 DIAGNOSIS — Z681 Body mass index (BMI) 19 or less, adult: Secondary | ICD-10-CM

## 2023-12-04 DIAGNOSIS — Z7902 Long term (current) use of antithrombotics/antiplatelets: Secondary | ICD-10-CM

## 2023-12-04 DIAGNOSIS — S91302A Unspecified open wound, left foot, initial encounter: Secondary | ICD-10-CM | POA: Diagnosis not present

## 2023-12-04 DIAGNOSIS — D62 Acute posthemorrhagic anemia: Secondary | ICD-10-CM | POA: Diagnosis not present

## 2023-12-04 DIAGNOSIS — E1169 Type 2 diabetes mellitus with other specified complication: Secondary | ICD-10-CM | POA: Diagnosis present

## 2023-12-04 DIAGNOSIS — E11628 Type 2 diabetes mellitus with other skin complications: Secondary | ICD-10-CM | POA: Diagnosis present

## 2023-12-04 DIAGNOSIS — E1152 Type 2 diabetes mellitus with diabetic peripheral angiopathy with gangrene: Secondary | ICD-10-CM | POA: Diagnosis present

## 2023-12-04 DIAGNOSIS — M869 Osteomyelitis, unspecified: Principal | ICD-10-CM

## 2023-12-04 DIAGNOSIS — Z794 Long term (current) use of insulin: Secondary | ICD-10-CM | POA: Diagnosis not present

## 2023-12-04 DIAGNOSIS — D638 Anemia in other chronic diseases classified elsewhere: Secondary | ICD-10-CM | POA: Diagnosis present

## 2023-12-04 DIAGNOSIS — R64 Cachexia: Secondary | ICD-10-CM | POA: Diagnosis present

## 2023-12-04 DIAGNOSIS — Z96653 Presence of artificial knee joint, bilateral: Secondary | ICD-10-CM | POA: Diagnosis present

## 2023-12-04 DIAGNOSIS — Z66 Do not resuscitate: Secondary | ICD-10-CM | POA: Diagnosis present

## 2023-12-04 DIAGNOSIS — E43 Unspecified severe protein-calorie malnutrition: Secondary | ICD-10-CM | POA: Diagnosis present

## 2023-12-04 DIAGNOSIS — Z89422 Acquired absence of other left toe(s): Secondary | ICD-10-CM

## 2023-12-04 DIAGNOSIS — F03C0 Unspecified dementia, severe, without behavioral disturbance, psychotic disturbance, mood disturbance, and anxiety: Secondary | ICD-10-CM | POA: Diagnosis present

## 2023-12-04 DIAGNOSIS — E11621 Type 2 diabetes mellitus with foot ulcer: Secondary | ICD-10-CM | POA: Diagnosis present

## 2023-12-04 DIAGNOSIS — Z79899 Other long term (current) drug therapy: Secondary | ICD-10-CM

## 2023-12-04 DIAGNOSIS — E11649 Type 2 diabetes mellitus with hypoglycemia without coma: Secondary | ICD-10-CM | POA: Diagnosis not present

## 2023-12-04 DIAGNOSIS — I10 Essential (primary) hypertension: Secondary | ICD-10-CM | POA: Diagnosis present

## 2023-12-04 DIAGNOSIS — M86172 Other acute osteomyelitis, left ankle and foot: Secondary | ICD-10-CM | POA: Diagnosis present

## 2023-12-04 DIAGNOSIS — Z23 Encounter for immunization: Secondary | ICD-10-CM | POA: Diagnosis present

## 2023-12-04 DIAGNOSIS — Z6820 Body mass index (BMI) 20.0-20.9, adult: Secondary | ICD-10-CM

## 2023-12-04 DIAGNOSIS — E782 Mixed hyperlipidemia: Secondary | ICD-10-CM | POA: Diagnosis present

## 2023-12-04 DIAGNOSIS — L89624 Pressure ulcer of left heel, stage 4: Secondary | ICD-10-CM | POA: Diagnosis present

## 2023-12-04 DIAGNOSIS — I69354 Hemiplegia and hemiparesis following cerebral infarction affecting left non-dominant side: Secondary | ICD-10-CM | POA: Diagnosis not present

## 2023-12-04 DIAGNOSIS — L97429 Non-pressure chronic ulcer of left heel and midfoot with unspecified severity: Secondary | ICD-10-CM | POA: Diagnosis present

## 2023-12-04 DIAGNOSIS — E1165 Type 2 diabetes mellitus with hyperglycemia: Secondary | ICD-10-CM | POA: Diagnosis present

## 2023-12-04 DIAGNOSIS — F0390 Unspecified dementia without behavioral disturbance: Secondary | ICD-10-CM | POA: Diagnosis not present

## 2023-12-04 DIAGNOSIS — E1142 Type 2 diabetes mellitus with diabetic polyneuropathy: Secondary | ICD-10-CM | POA: Diagnosis present

## 2023-12-04 DIAGNOSIS — I96 Gangrene, not elsewhere classified: Secondary | ICD-10-CM | POA: Diagnosis present

## 2023-12-04 DIAGNOSIS — Z89612 Acquired absence of left leg above knee: Secondary | ICD-10-CM

## 2023-12-04 DIAGNOSIS — I7025 Atherosclerosis of native arteries of other extremities with ulceration: Secondary | ICD-10-CM | POA: Diagnosis present

## 2023-12-04 DIAGNOSIS — G546 Phantom limb syndrome with pain: Secondary | ICD-10-CM | POA: Diagnosis not present

## 2023-12-04 DIAGNOSIS — Z7982 Long term (current) use of aspirin: Secondary | ICD-10-CM

## 2023-12-04 DIAGNOSIS — Z803 Family history of malignant neoplasm of breast: Secondary | ICD-10-CM

## 2023-12-04 DIAGNOSIS — M810 Age-related osteoporosis without current pathological fracture: Secondary | ICD-10-CM | POA: Diagnosis present

## 2023-12-04 DIAGNOSIS — M24562 Contracture, left knee: Secondary | ICD-10-CM | POA: Diagnosis present

## 2023-12-04 DIAGNOSIS — I2510 Atherosclerotic heart disease of native coronary artery without angina pectoris: Secondary | ICD-10-CM | POA: Diagnosis present

## 2023-12-04 DIAGNOSIS — D72829 Elevated white blood cell count, unspecified: Secondary | ICD-10-CM | POA: Diagnosis present

## 2023-12-04 LAB — COMPREHENSIVE METABOLIC PANEL WITH GFR
ALT: 10 U/L (ref 0–44)
AST: 21 U/L (ref 15–41)
Albumin: 2.9 g/dL — ABNORMAL LOW (ref 3.5–5.0)
Alkaline Phosphatase: 85 U/L (ref 38–126)
Anion gap: 14 (ref 5–15)
BUN: 19 mg/dL (ref 8–23)
CO2: 27 mmol/L (ref 22–32)
Calcium: 9.1 mg/dL (ref 8.9–10.3)
Chloride: 102 mmol/L (ref 98–111)
Creatinine, Ser: 0.66 mg/dL (ref 0.44–1.00)
GFR, Estimated: >60 mL/min (ref >60)
Glucose, Bld: 192 mg/dL — ABNORMAL HIGH (ref 70–99)
Potassium: 4.1 mmol/L (ref 3.5–5.1)
Sodium: 143 mmol/L (ref 135–145)
Total Bilirubin: 0.7 mg/dL (ref 0.0–1.2)
Total Protein: 8.6 g/dL — ABNORMAL HIGH (ref 6.5–8.1)

## 2023-12-04 LAB — CBC WITH DIFFERENTIAL/PLATELET
Abs Immature Granulocytes: 0.09 K/uL — ABNORMAL HIGH (ref 0.00–0.07)
Basophils Absolute: 0 K/uL (ref 0.0–0.1)
Basophils Relative: 0 %
Eosinophils Absolute: 0.2 K/uL (ref 0.0–0.5)
Eosinophils Relative: 1 %
HCT: 33.6 % — ABNORMAL LOW (ref 36.0–46.0)
Hemoglobin: 10 g/dL — ABNORMAL LOW (ref 12.0–15.0)
Immature Granulocytes: 1 %
Lymphocytes Relative: 10 %
Lymphs Abs: 1.7 K/uL (ref 0.7–4.0)
MCH: 28.2 pg (ref 26.0–34.0)
MCHC: 29.8 g/dL — ABNORMAL LOW (ref 30.0–36.0)
MCV: 94.6 fL (ref 80.0–100.0)
Monocytes Absolute: 0.7 K/uL (ref 0.1–1.0)
Monocytes Relative: 4 %
Neutro Abs: 14.1 K/uL — ABNORMAL HIGH (ref 1.7–7.7)
Neutrophils Relative %: 84 %
Platelets: 484 K/uL — ABNORMAL HIGH (ref 150–400)
RBC: 3.55 MIL/uL — ABNORMAL LOW (ref 3.87–5.11)
RDW: 15.9 % — ABNORMAL HIGH (ref 11.5–15.5)
WBC: 16.8 K/uL — ABNORMAL HIGH (ref 4.0–10.5)
nRBC: 0 % (ref 0.0–0.2)

## 2023-12-04 LAB — SEDIMENTATION RATE: Sed Rate: >140 mm/h — ABNORMAL HIGH (ref 0–30)

## 2023-12-04 LAB — LACTIC ACID, PLASMA: Lactic Acid, Venous: 1.6 mmol/L (ref 0.5–1.9)

## 2023-12-04 LAB — CBG MONITORING, ED: Glucose-Capillary: 118 mg/dL — ABNORMAL HIGH (ref 70–99)

## 2023-12-04 MED ORDER — ACETAMINOPHEN 325 MG PO TABS
650.0000 mg | ORAL_TABLET | Freq: Four times a day (QID) | ORAL | Status: DC | PRN
  Administered 2023-12-12: 650 mg via ORAL
  Filled 2023-12-04: qty 2

## 2023-12-04 MED ORDER — IOHEXOL 300 MG/ML  SOLN
100.0000 mL | Freq: Once | INTRAMUSCULAR | Status: AC | PRN
  Administered 2023-12-04: 100 mL via INTRAVENOUS

## 2023-12-04 MED ORDER — INSULIN ASPART 100 UNIT/ML IJ SOLN
0.0000 [IU] | Freq: Every day | INTRAMUSCULAR | Status: DC
  Administered 2023-12-07 – 2023-12-08 (×2): 2 [IU] via SUBCUTANEOUS
  Administered 2023-12-09: 3 [IU] via SUBCUTANEOUS
  Administered 2023-12-12: 2 [IU] via SUBCUTANEOUS
  Filled 2023-12-04 (×4): qty 1

## 2023-12-04 MED ORDER — OXYCODONE HCL 5 MG PO TABS
5.0000 mg | ORAL_TABLET | ORAL | Status: DC | PRN
  Administered 2023-12-11 – 2023-12-12 (×2): 5 mg via ORAL
  Filled 2023-12-04 (×2): qty 1

## 2023-12-04 MED ORDER — HYDROMORPHONE HCL 1 MG/ML IJ SOLN
0.5000 mg | INTRAMUSCULAR | Status: DC | PRN
  Administered 2023-12-09 – 2023-12-12 (×4): 0.5 mg via INTRAVENOUS
  Filled 2023-12-04 (×5): qty 0.5

## 2023-12-04 MED ORDER — PIPERACILLIN-TAZOBACTAM 3.375 G IVPB
3.3750 g | Freq: Three times a day (TID) | INTRAVENOUS | Status: DC
  Administered 2023-12-04 – 2023-12-09 (×15): 3.375 g via INTRAVENOUS
  Filled 2023-12-04 (×14): qty 50

## 2023-12-04 MED ORDER — ATORVASTATIN CALCIUM 20 MG PO TABS
80.0000 mg | ORAL_TABLET | Freq: Every day | ORAL | Status: DC
  Administered 2023-12-04 – 2023-12-12 (×9): 80 mg via ORAL
  Filled 2023-12-04 (×9): qty 4

## 2023-12-04 MED ORDER — INSULIN ASPART 100 UNIT/ML IJ SOLN
0.0000 [IU] | Freq: Three times a day (TID) | INTRAMUSCULAR | Status: DC
  Administered 2023-12-05 – 2023-12-06 (×2): 1 [IU] via SUBCUTANEOUS
  Administered 2023-12-07: 3 [IU] via SUBCUTANEOUS
  Administered 2023-12-07: 1 [IU] via SUBCUTANEOUS
  Administered 2023-12-08: 2 [IU] via SUBCUTANEOUS
  Administered 2023-12-08: 5 [IU] via SUBCUTANEOUS
  Administered 2023-12-08 – 2023-12-09 (×3): 3 [IU] via SUBCUTANEOUS
  Administered 2023-12-10 (×2): 2 [IU] via SUBCUTANEOUS
  Administered 2023-12-10 – 2023-12-11 (×2): 3 [IU] via SUBCUTANEOUS
  Administered 2023-12-11: 2 [IU] via SUBCUTANEOUS
  Administered 2023-12-11 – 2023-12-12 (×2): 3 [IU] via SUBCUTANEOUS
  Administered 2023-12-12: 7 [IU] via SUBCUTANEOUS
  Administered 2023-12-12: 2 [IU] via SUBCUTANEOUS
  Filled 2023-12-04 (×18): qty 1

## 2023-12-04 MED ORDER — VITAMIN B-12 1000 MCG PO TABS
1000.0000 ug | ORAL_TABLET | Freq: Every day | ORAL | Status: DC
  Administered 2023-12-04 – 2023-12-12 (×9): 1000 ug via ORAL
  Filled 2023-12-04 (×2): qty 1
  Filled 2023-12-04: qty 2
  Filled 2023-12-04 (×6): qty 1

## 2023-12-04 MED ORDER — VANCOMYCIN HCL 750 MG/150ML IV SOLN
750.0000 mg | INTRAVENOUS | Status: DC
  Administered 2023-12-05 – 2023-12-08 (×4): 750 mg via INTRAVENOUS
  Filled 2023-12-04 (×5): qty 150

## 2023-12-04 MED ORDER — VANCOMYCIN HCL 1500 MG/300ML IV SOLN
1500.0000 mg | Freq: Once | INTRAVENOUS | Status: AC
  Administered 2023-12-04: 1500 mg via INTRAVENOUS
  Filled 2023-12-04: qty 300

## 2023-12-04 MED ORDER — SENNOSIDES-DOCUSATE SODIUM 8.6-50 MG PO TABS: 1.0000 | ORAL_TABLET | Freq: Every evening | ORAL | Status: DC | PRN

## 2023-12-04 MED ORDER — LISINOPRIL 20 MG PO TABS
20.0000 mg | ORAL_TABLET | Freq: Every day | ORAL | Status: DC
  Administered 2023-12-04 – 2023-12-12 (×9): 20 mg via ORAL
  Filled 2023-12-04 (×4): qty 1
  Filled 2023-12-04: qty 2
  Filled 2023-12-04 (×4): qty 1

## 2023-12-04 MED ORDER — ACETAMINOPHEN 650 MG RE SUPP: 650.0000 mg | Freq: Four times a day (QID) | RECTAL | Status: DC | PRN

## 2023-12-04 MED ORDER — PIPERACILLIN-TAZOBACTAM 3.375 G IVPB 30 MIN
3.3750 g | Freq: Once | INTRAVENOUS | Status: AC
  Administered 2023-12-04: 3.375 g via INTRAVENOUS
  Filled 2023-12-04: qty 50

## 2023-12-04 MED ORDER — HEPARIN SODIUM (PORCINE) 5000 UNIT/ML IJ SOLN
5000.0000 [IU] | Freq: Three times a day (TID) | INTRAMUSCULAR | Status: DC
  Administered 2023-12-04 – 2023-12-12 (×24): 5000 [IU] via SUBCUTANEOUS
  Filled 2023-12-04 (×21): qty 1

## 2023-12-04 NOTE — Consult Note (Signed)
 ED Pharmacy Antibiotic Sign Off An antibiotic consult was received from an ED provider for Vancomycin  per pharmacy dosing for wound infection. A chart review was completed to assess appropriateness.   The following one time order(s) were placed:  Vancomycin  1500mg  IV.  Further antibiotic and/or antibiotic pharmacy consults should be ordered by the admitting provider if indicated.   Thank you for allowing pharmacy to be a part of this patient's care.   Falyn Rubel A Naquan Garman, [facility]  Clinical Pharmacist 12/04/23 11:51 AM

## 2023-12-04 NOTE — ED Notes (Signed)
 On call supervisor for her case worker: [phone] Glendia Richters

## 2023-12-04 NOTE — Consult Note (Signed)
 PODIATRIC SURGERY: CONSULT NOTE  Consulting Physician: Dr. Ronal Lewandowsky   Reason for Consult: Diabetic foot infection left foot   HPI: [name] is a 89 y.o. female with PMH for dementia, diabetes, CVA with left hemiparesis, HTN, HLD, presents to the emergency department with  that presented to the ED for evaluation of wound/ infection concern to the LEFT FOOT 2nd digit / gangrene and foot abscess.   Patient was initially seen and admitted at end of September secondary for concern of left foot second digit gangrene where during hospital admission, she has had underwent angioplastly of LLE on 09/26.  At that time podiatry was consulted 11/09/2023 and stable gangrene was noted without concern for osteomyelitis.  Patient verbalized her preference for no surgical intervention in form of amputation, however given stable gangrene and no signs of osteo on x-ray imaging, no acute infection -it was determined that she would be stable for discharge with good close wound care monitoring/and stabilizing Betadine  dressings.  Patient has been lost to follow-up with wound care center and podiatry clinic since discharge date.  Prior to last discharge patient had been undergoing left heel and second digit wound care appointments in outpatient basis.  She has not followed since from documentation review.  Legal guardian, [name], DSS manages patients care.  Contact DSS legal guardian [name] or [name] with questions, concerns regarding patient    On provider evaluation noted the following: [Yes] Drainage.  How long: Unknown [Yes] Malodor. How long: Unknown [Yes] Redness. How long: Unknown [Yes] Pain. How long: Unknown, patient suspected to be in pain on plantar foot palpation as she continually moves whenever she is awoken.  Onset of wound: Gradual Course: Worsening  Aggravating factors: Compliance Prior Treatment: Wound care center -lost to follow-up ; podiatry lost to follow-up Prior  Wound Care instructions: Prior hospitalization: Twice daily Betadine  dressings to the left second toe, Santyl daily dressings to the posterior lateral heel  Podiatry consulted for evaluation of wound / infection left foot.  Denies: F/C/N/V  SOB/CP/calf pain.  Last PO: unknown.   PMHx:  Past Medical History:  Diagnosis Date   Carpal tunnel syndrome on both sides    CVA (cerebral vascular accident) (HCC) 11/14/2019   Diabetes (HCC)    Diabetic polyneuropathy (HCC)    Diverticulitis    Hyperlipidemia    Hypertension    Osteoarthritis    Sepsis (HCC) 03/22/2018   Suspected UTI 04/09/2023   UTI (urinary tract infection) 09/20/2019   Vaginal prolapse     Surgical Hx:  Past Surgical History:  Procedure Laterality Date   AMPUTATION Left 05/01/2023   Procedure: AMPUTATION, FOOT, RAY;  Surgeon: Lennie Barter, DPM;  Location: [facility];  Service: Orthopedics/Podiatry;  Laterality: Left;  LEFT PARTIAL FIRST RAY   AMPUTATION TOE Left 08/13/2022   Procedure: EXCISION OF 5TH RAY;  Surgeon: Ashley Soulier, DPM;  Location: [facility];  Service: Orthopedics/Podiatry;  Laterality: Left;   CESAREAN SECTION     IRRIGATION AND DEBRIDEMENT FOOT Left 01/09/2022   Procedure: IRRIGATION AND DEBRIDEMENT FOOT WITH BONE BIOPSY;  Surgeon: Ashley Soulier, DPM;  Location: [facility];  Service: Podiatry;  Laterality: Left;   LOWER EXTREMITY ANGIOGRAPHY Left 06/11/2021   Procedure: Lower Extremity Angiography;  Surgeon: Jama Cordella MATSU, MD;  Location: [facility];  Service: Cardiovascular;  Laterality: Left;   LOWER EXTREMITY ANGIOGRAPHY Left 08/11/2022   Procedure: Lower Extremity Angiography;  Surgeon: Jama Cordella MATSU, MD;  Location: [facility];  Service: Cardiovascular;  Laterality: Left;   LOWER EXTREMITY ANGIOGRAPHY Left 04/30/2023   Procedure: Lower Extremity Angiography;  Surgeon: Marea Selinda RAMAN, MD;  Location: [facility];  Service: Cardiovascular;  Laterality: Left;   LOWER  EXTREMITY ANGIOGRAPHY Left 11/06/2023   Procedure: Lower Extremity Angiography;  Surgeon: Jama Cordella MATSU, MD;  Location: [facility];  Service: Cardiovascular;  Laterality: Left;   pessary     REPLACEMENT TOTAL KNEE BILATERAL     VENTRAL HERNIA REPAIR     VENTRAL HERNIA REPAIR N/A 01/13/2016   Procedure: HERNIA REPAIR VENTRAL ADULT WITH SMALL BOWEL RESECTION, LYSIS OF ADHESIONS;  Surgeon: Dorothyann LITTIE Husk, MD;  Location: [facility];  Service: General;  Laterality: N/A;    FHx:  Family History  Problem Relation Age of Onset   Breast cancer Mother 9   Breast cancer Maternal Aunt     Social History:  reports that she has never smoked. She has never used smokeless tobacco. She reports that she does not drink alcohol  and does not use drugs.  Allergies:  Allergies  Allergen Reactions   Metformin  And Related Nausea And Vomiting    (Not in a hospital admission)   Physical Exam: Blood pressure (!) 134/56, pulse 70, temperature 98 F (36.7 C), temperature source Oral, resp. rate 16, SpO2 100%.  Constitutional: Patient appears of normal development and good nutritional status for stated age, well-groomed, and of normal body habitus. Phonating appropriately.   Psychiatric: Alert and oriented to time, place, person and situation. The patient is noted to have good judgment and insight into the hospital visit  FOCUSED LOWER EXTREMITY EXAMINATION:   Neurological:  - Protective sensation diminished / absent - Gross protective sensation diminished bilaterally.  - No focal motor or sensory deficits identified bilaterally.    Vascular:  - Dorsalis Pedis artery on palpation: Unable to palpate - Posterior Tibial artery on palpation: Unable to palpate - Capillary Filling Times: Sluggish - Peripheral edema: Localized to left foot, mild     Musculoskeletal:  Significantly contracted LEFT LE with hyperflexion of the knee.  Ankle Joint ROM: decreased, equinus noted Global foot -  TTP: 2nd digit no TTP, TTP to plantar forefoot.  TTP to posterior heel wound.   Dermatological:  - Skin quality: atrophic    #WOUND 1   Type: Full-thickness, arterial ischemic ulceration Location: Distal second digit, left foot --- Wound base:100% % Necrotic  Adjacent tissue/ Wound borders: Rolled, macerated Undermining noted (Pre-debridement): Yes; Direction: Tracking proximally PTB: Yes, bone exposed Malodor: Yes Active Drainage: Yes; If yes, consistency: Purulence to the plantar subsecond met /plantar first intermetatarsal space   #WOUND 2  Type: Full-thickness, arterial ischemic ulceration /pressure ulcer stage IV Location: Posterior lateral heel, left foot --- Wound base:20% Necrotic 60% fibrotic 20% granular Adjacent tissue/ Wound borders: Rolled, macerated Undermining noted (Pre-debridement): No PTB: Yes Malodor: Yes Active Drainage: No; If yes, consistency: N/A           Results for orders placed or performed during the hospital encounter of 12/04/23 (from the past 48 hours)  CBC with Differential     Status: Abnormal   Collection Time: 12/04/23 12:11 PM  Result Value Ref Range   WBC 16.8 (H) 4.0 - 10.5 K/uL   RBC 3.55 (L) 3.87 - 5.11 MIL/uL   Hemoglobin 10.0 (L) 12.0 - 15.0 g/dL   HCT 66.3 (L) 63.9 - 53.9 %   MCV 94.6 80.0 - 100.0 fL   MCH 28.2 26.0 - 34.0  pg   MCHC 29.8 (L) 30.0 - 36.0 g/dL   RDW 84.0 (H) 88.4 - 84.4 %   Platelets 484 (H) 150 - 400 K/uL   nRBC 0.0 0.0 - 0.2 %   Neutrophils Relative % 84 %   Neutro Abs 14.1 (H) 1.7 - 7.7 K/uL   Lymphocytes Relative 10 %   Lymphs Abs 1.7 0.7 - 4.0 K/uL   Monocytes Relative 4 %   Monocytes Absolute 0.7 0.1 - 1.0 K/uL   Eosinophils Relative 1 %   Eosinophils Absolute 0.2 0.0 - 0.5 K/uL   Basophils Relative 0 %   Basophils Absolute 0.0 0.0 - 0.1 K/uL   Immature Granulocytes 1 %   Abs Immature Granulocytes 0.09 (H) 0.00 - 0.07 K/uL    Comment: Performed at [facility], [address] [place], [place] [address]  Comprehensive metabolic panel     Status: Abnormal   Collection Time: 12/04/23 12:11 PM  Result Value Ref Range   Sodium 143 135 - 145 mmol/L   Potassium 4.1 3.5 - 5.1 mmol/L   Chloride 102 98 - 111 mmol/L   CO2 27 22 - 32 mmol/L   Glucose, Bld 192 (H) 70 - 99 mg/dL    Comment: Glucose reference range applies only to samples taken after fasting for at least 8 hours.   BUN 19 8 - 23 mg/dL   Creatinine, Ser 9.33 0.44 - 1.00 mg/dL   Calcium  9.1 8.9 - 10.3 mg/dL   Total Protein 8.6 (H) 6.5 - 8.1 g/dL   Albumin 2.9 (L) 3.5 - 5.0 g/dL   AST 21 15 - 41 U/L   ALT 10 0 - 44 U/L   Alkaline Phosphatase 85 38 - 126 U/L   Total Bilirubin 0.7 0.0 - 1.2 mg/dL   GFR, Estimated >39 >39 mL/min    Comment: (NOTE) Calculated using the CKD-EPI Creatinine Equation (2021)    Anion gap 14 5 - 15    Comment: Performed at [facility], [address] [place], [place] [address]   No results found.   Narrative & Impression  CLINICAL DATA:  Provided history: Osteomyelitis suspected, foot, xray done   EXAM: CT OF THE LOWER LEFT EXTREMITY WITHOUT CONTRAST   TECHNIQUE: Multidetector CT imaging of the lower left extremity was performed following the standard protocol before and during bolus administration of intravenous contrast.   RADIATION DOSE REDUCTION: This exam was performed according to the departmental dose-optimization program which includes automated exposure control, adjustment of the mA and/or kV according to patient size and/or use of iterative reconstruction technique.   CONTRAST:  OMNIPAQUE  IOHEXOL  300 MG/ML  SOLN   COMPARISON:  Radiograph earlier today   FINDINGS: Patient with limited mobility, there is significant streak artifact from right knee replacement that is unable to be removed from the field of view. This significantly limits assessment.   Bones/Joint/Cartilage   The bones are diffusely under mineralized. Previous  transmetatarsal amputation of the first ray. The fifth ray has been resected. There are destructive changes in the second toe distal and middle phalanx with air in the distal phalanx and adjacent soft tissues. The distal phalanx is exposed to air. No other obvious bony destructive changes. No evidence of acute fracture. Pes planus is better demonstrated on radiograph.   Ligaments   Suboptimally assessed by CT.   Muscles and Tendons   Fatty atrophy of the intrinsic musculature of the foot.   Soft tissues   There is air  in the soft tissues adjacent to the distal second digit, the distal digit is exposed to air. Circumferential soft tissue thickening of the toe. Fluid collection in the plantar foot subjacent to and distal to the resected first metatarsal measures approximately 2.7 x 1.5 x 1.8 cm, series 18, image 71. Soft tissue thickening extends distally with fluid surrounding the second toe into the plantar soft tissues, sterility indeterminate, series 18, image 54. Diffuse forefoot soft tissue thickening. Prominent vascular calcifications.   IMPRESSION: 1. Osteomyelitis of the second toe distal and middle phalanx with air in the distal phalanx and adjacent soft tissues. The distal phalanx is exposed to air. 2. Fluid collection in the plantar foot subjacent to and distal to the resected first metatarsal measures approximately 2.7 x 1.5 x 1.8 cm, suspicious for abscess. Soft tissue thickening extends distally with fluid surrounding the second toe into the plantar soft tissues, sterility indeterminate. 3. Previous transmetatarsal amputation of the first ray. The fifth ray has been resected. 4. Osteoporosis as well as streak artifact limits detailed CT assessment.     Electronically Signed   By: [name] M.D.   On: 12/04/2023 17:29    Assessment  -Diabetic foot infection, left foot -Acute osteomyelitis, left foot -Pressure ulcer with suspected necrosis to  bone, left foot  Patient evaluated in bed this evening, is not lucid and intermittently answers questions on exam.  Patient relates she is not having any specific pain, however withdraws from any stimulus to the left lower extremity.  She has active weeping from her plantar foot, and exposed left second digit distal phalanx ; her posterior lateral heel wound probes to periosteal tissue and in 1 area to bone, no underlying fluctuance noted. Will attempt to get a hold of guardian, DSS tomorrow re: discussion of consent and appropriate treatment course given patient's limited mobility, vascular status, and overall treatment goals.  I suspect discussion will be held reTMA versus BKA for infection control measures. *Will await placing case request until discussion is held.   Plan  - ABX: Appreciate Medicine / ID / Pharmacy assist with antibiotic stewardship and appropriate coverage.  -Follow-up wound culture; obtained 12/04/2023, order placed -Follow-up ESR CRP - Diet: NPO at midnight.  - Activity: NWB to left lower extremity ; Heel offloading and pressure offloading measures in place. (Patient must be in Prevalon heel offloading boot while in bed on the left lower extremity versus heel offloading measures on pillows ----given patient contracted state this might be easier) - Wound Care: Dressings conducted at bedside today.  Betadine  paint to the posterior lateral heel wound with silicone gentle border Mepilex dressing.  Betadine  paint to distal phalanx left second digit as well as plantar foot (attempted packing to plantar foot however patient will not tolerate this), light nonrestrictive Kerlix dressing applied so exposed bone does not catch on sheets, and drainage is better contained.  Greig KANDICE Blush 12/04/2023, 12:48 PM

## 2023-12-04 NOTE — ED Triage Notes (Signed)
 Pt arrives via [facility] from wound care for a wound check. Pt was there for a routine leg check and staff at wound care sent pt over because they are worried about gangrene and an abscess. Pt has dementia at baseline and hx of diabetes  EMS vitals: 97.33F 153/67 BP 217 CBG

## 2023-12-04 NOTE — Consult Note (Signed)
 Pharmacy Antibiotic Note  [name] is a 89 y.o. female admitted on 12/04/2023 with acute osteomyelitis of left foot.  Pharmacy has been consulted for Zosyn  and vancomycin  dosing.  Vancomycin  1500 mg IV x 1 given in ED (10/24 @ 1300)  Plan: Start vancomycin  750 mg IV every 24 hours Estimated AUC 475, Cmin 12.3 IBW, Scr rounded to 0.8, Vd 0.72 Vancomycin  levels at steady state or as clinically indicated Start Zosyn  3.375 grams IV every 8 hours (4 hr infusion) Follow renal function and cultures for adjustments  Weight: 54.9 kg (121 lb 0.5 oz)  Temp (24hrs), Avg:98.1 F (36.7 C), Min:97.6 F (36.4 C), Max:98.7 F (37.1 C)  Recent Labs  Lab 12/04/23 1211  WBC 16.8*  CREATININE 0.66  LATICACIDVEN 1.6    Estimated Creatinine Clearance: 42.8 mL/min (by C-G formula based on SCr of 0.66 mg/dL).    Allergies  Allergen Reactions   Metformin  And Related Nausea And Vomiting    Antimicrobials this admission: Vancomycin  10/24 >>  Zosyn  10/24 >>    Microbiology results: 10/24 BCx: pending 10/24 wound culture left foot: pending  Thank you for allowing pharmacy to be a part of this patient's care.  Kayla JULIANNA Blew, PharmD 12/04/2023 9:02 PM

## 2023-12-04 NOTE — ED Provider Notes (Signed)
 [facility] Provider Note    Event Date/Time   First MD Initiated Contact with Patient 12/04/23 1128     (approximate)   History   Wound Check   HPI  [name] is a 89 y.o. female with hypertension, hyperlipidemia, diabetes, CVA with left hemiparesis, dementia who comes in with concerns for infection of her toes.  Reviewed the hospital note where patient was admitted on 9/26 and had recanalization of the left lower extremity there was no concern for osteo at the time and so patient was discharged.  Upon wound care check there is concern for abscess, gangrene.   Attempted to call Rubie Moats legal gaurdian and no answer  Attempted to call [facility]  was suppose to have a follow up  but facility missed appointment. The facility has not been monitor the wound. They would want her to have surgery for the foot- they would need candice to be able to sign consents.       Physical Exam   Triage Vital Signs: ED Triage Vitals [12/04/23 1136]  Encounter Vitals Group     BP (!) 134/56     Girls Systolic BP Percentile      Girls Diastolic BP Percentile      Boys Systolic BP Percentile      Boys Diastolic BP Percentile      Pulse Rate 70     Resp 16     Temp 98 F (36.7 C)     Temp Source Oral     SpO2 100 %     Weight      Height      Head Circumference      Peak Flow      Pain Score      Pain Loc      Pain Education      Exclude from Growth Chart     Most recent vital signs: Vitals:   12/04/23 1136  BP: (!) 134/56  Pulse: 70  Resp: 16  Temp: 98 F (36.7 C)  SpO2: 100%     General: Awake, no distress.  CV:  Good peripheral perfusion.  Resp:  Normal effort.  Abd:  No distention.  Other:  Picture in media tab.  Foot is well-perfused and feels nice and warm but does have bulging noted around the toes with necrotic toe that is falling off   ED Results / Procedures / Treatments   Labs (all labs ordered are listed, but only  abnormal results are displayed) Labs Reviewed  CBC WITH DIFFERENTIAL/PLATELET - Abnormal; Notable for the following components:      Result Value   WBC 16.8 (*)    RBC 3.55 (*)    Hemoglobin 10.0 (*)    HCT 33.6 (*)    MCHC 29.8 (*)    RDW 15.9 (*)    Platelets 484 (*)    Neutro Abs 14.1 (*)    Abs Immature Granulocytes 0.09 (*)    All other components within normal limits  COMPREHENSIVE METABOLIC PANEL WITH GFR - Abnormal; Notable for the following components:   Glucose, Bld 192 (*)    Total Protein 8.6 (*)    Albumin 2.9 (*)    All other components within normal limits  CULTURE, BLOOD (ROUTINE X 2)  CULTURE, BLOOD (ROUTINE X 2)  LACTIC ACID, PLASMA     EKG  My interpretation of EKG:  Sinus rate 75 without any ST elevation or T wave versions, normal intervals  RADIOLOGY I have reviewed the xray personally and interpreted concerning for osteomyelitis   PROCEDURES:  Critical Care performed: Yes, see critical care procedure note(s)  .1-3 Lead EKG Interpretation  Performed by: Ernest Ronal BRAVO, MD Authorized by: Ernest Ronal BRAVO, MD     Interpretation: normal     ECG rate:  70   ECG rate assessment: normal     Rhythm: sinus rhythm     Ectopy: none     Conduction: normal   .Critical Care  Performed by: Ernest Ronal BRAVO, MD Authorized by: Ernest Ronal BRAVO, MD   Critical care provider statement:    Critical care time (minutes):  30   Critical care was necessary to treat or prevent imminent or life-threatening deterioration of the following conditions: osteo.   Critical care was time spent personally by me on the following activities:  Development of treatment plan with patient or surrogate, discussions with consultants, evaluation of patient's response to treatment, examination of patient, ordering and review of laboratory studies, ordering and review of radiographic studies, ordering and performing treatments and interventions, pulse oximetry, re-evaluation of patient's  condition and review of old charts    MEDICATIONS ORDERED IN ED: Medications  heparin  injection 5,000 Units (has no administration in time range)  acetaminophen  (TYLENOL ) tablet 650 mg (has no administration in time range)    Or  acetaminophen  (TYLENOL ) suppository 650 mg (has no administration in time range)  senna-docusate (Senokot-S) tablet 1 tablet (has no administration in time range)  oxyCODONE  (Oxy IR/ROXICODONE ) immediate release tablet 5 mg (has no administration in time range)  HYDROmorphone  (DILAUDID ) injection 0.5 mg (has no administration in time range)  piperacillin -tazobactam (ZOSYN ) IVPB 3.375 g (0 g Intravenous Stopped 12/04/23 1255)  vancomycin  (VANCOREADY) IVPB 1500 mg/300 mL (0 mg Intravenous Stopped 12/04/23 1504)     IMPRESSION / MDM / ASSESSMENT AND PLAN / ED COURSE  I reviewed the triage vital signs and the nursing notes.   Patient's presentation is most consistent with acute presentation with potential threat to life or bodily function.   Patient comes in with what looks like a gangrenous toe with possible abscess.  X-ray with concern for osteo.  Patient started on broad-spectrum antibiotics does not meet sepsis criteria does have elevated white count.  Discussed the case with podiatry who wanted MRI and she will see patient.  Given the above patient will require admission to the hospital.  Lactate is normal.  White count was elevated CMP reassuring.  No signs of acute limb ischemia at this time and recently had intervention for this.  The patient is on the cardiac monitor to evaluate for evidence of arrhythmia and/or significant heart rate changes.      FINAL CLINICAL IMPRESSION(S) / ED DIAGNOSES   Final diagnoses:  Osteomyelitis, unspecified site, unspecified type (HCC)  Gangrene of toe (HCC)     Rx / DC Orders   ED Discharge Orders     None        Note:  This document was prepared using Dragon voice recognition software and may include  unintentional dictation errors.   Ernest Ronal BRAVO, MD 12/04/23 [phone]

## 2023-12-04 NOTE — ED Notes (Signed)
 This RN noticed that there were no blood cultures in process on this patient at this time and the admitting doctor was asking if the cultures were drawn. After speaking to lab, they told this RN that they never received any blood cultures, so this RN gathered supplies in order to obtain blood cultures at this time.

## 2023-12-04 NOTE — H&P (Signed)
 History and Physical    [name] FMW:[id number] DOB: 09/18/1933 DOA: 12/04/2023  DOS: the patient was seen and examined on 12/04/2023  PCP: Rudolpho Norleen BIRCH, MD   Patient coming from: Home  I have personally briefly reviewed patient's old medical records in [facility] and CareEverywhere  HPI:   [name] is a 89 y.o. year old female with past medical history of hypertension, hyperlipidemia complicated by PAD, type 2 diabetes, history of foot wounds with complication of osteomyelitis presenting to the ED with left toe wound.  Pt not able to provide any history and states she lives with her son. She is currently not having pain or any other acute concerns.   On arrival to the ED patient was noted to be HDS stable.  Lab work showed leukocytosis to 16,000, CMP with elevated total protein and decreased albumin but otherwise normal renal function and electrolytes.  Lactic acid was within normal limits.  Given the appearance of her foot wound and x-ray was obtained that showed concern for osteomyelitis.  EDP contacted podiatry who will see the patient.   TRH contacted for admission.  Review of Systems: As mentioned in the history of present illness. All other systems reviewed and are negative.   Past Medical History:  Diagnosis Date   Carpal tunnel syndrome on both sides    CVA (cerebral vascular accident) (HCC) 11/14/2019   Diabetes (HCC)    Diabetic polyneuropathy (HCC)    Diverticulitis    Hyperlipidemia    Hypertension    Osteoarthritis    Sepsis (HCC) 03/22/2018   Suspected UTI 04/09/2023   UTI (urinary tract infection) 09/20/2019   Vaginal prolapse     Past Surgical History:  Procedure Laterality Date   AMPUTATION Left 05/01/2023   Procedure: AMPUTATION, FOOT, RAY;  Surgeon: Lennie Barter, DPM;  Location: [facility];  Service: Orthopedics/Podiatry;  Laterality: Left;  LEFT PARTIAL FIRST RAY   AMPUTATION TOE Left 08/13/2022   Procedure: EXCISION OF 5TH  RAY;  Surgeon: Ashley Soulier, DPM;  Location: [facility];  Service: Orthopedics/Podiatry;  Laterality: Left;   CESAREAN SECTION     IRRIGATION AND DEBRIDEMENT FOOT Left 01/09/2022   Procedure: IRRIGATION AND DEBRIDEMENT FOOT WITH BONE BIOPSY;  Surgeon: Ashley Soulier, DPM;  Location: [facility];  Service: Podiatry;  Laterality: Left;   LOWER EXTREMITY ANGIOGRAPHY Left 06/11/2021   Procedure: Lower Extremity Angiography;  Surgeon: Jama Cordella KANDICE, MD;  Location: [facility];  Service: Cardiovascular;  Laterality: Left;   LOWER EXTREMITY ANGIOGRAPHY Left 08/11/2022   Procedure: Lower Extremity Angiography;  Surgeon: Jama Cordella KANDICE, MD;  Location: [facility];  Service: Cardiovascular;  Laterality: Left;   LOWER EXTREMITY ANGIOGRAPHY Left 04/30/2023   Procedure: Lower Extremity Angiography;  Surgeon: Marea Selinda RAMAN, MD;  Location: [facility];  Service: Cardiovascular;  Laterality: Left;   LOWER EXTREMITY ANGIOGRAPHY Left 11/06/2023   Procedure: Lower Extremity Angiography;  Surgeon: Jama Cordella KANDICE, MD;  Location: [facility];  Service: Cardiovascular;  Laterality: Left;   pessary     REPLACEMENT TOTAL KNEE BILATERAL     VENTRAL HERNIA REPAIR     VENTRAL HERNIA REPAIR N/A 01/13/2016   Procedure: HERNIA REPAIR VENTRAL ADULT WITH SMALL BOWEL RESECTION, LYSIS OF ADHESIONS;  Surgeon: Dorothyann LITTIE Husk, MD;  Location: [facility];  Service: General;  Laterality: N/A;     Allergies  Allergen Reactions   Metformin  And Related Nausea And Vomiting    Family History  Problem Relation Age of Onset   Breast cancer Mother 86   Breast cancer Maternal Aunt     Prior to Admission medications   Medication Sig Start Date End Date Taking? Authorizing Provider  acetaminophen  (TYLENOL ) 325 MG tablet Take 2 tablets (650 mg total) by mouth every 6 (six) hours as needed for mild pain, moderate pain, fever or headache. 01/25/16  Yes Loflin, Dorothyann CROME, MD  ascorbic acid   (VITAMIN C ) 500 MG tablet Take 500 mg by mouth 2 (two) times daily.   Yes [provider]  aspirin  81 MG chewable tablet Chew 81 mg by mouth daily.   Yes [provider]  atorvastatin  (LIPITOR ) 80 MG tablet Take 1 tablet (80 mg total) by mouth daily. 09/26/19  Yes Regalado, Belkys A, MD  bisacodyl  (DULCOLAX) 10 MG suppository Place 1 suppository (10 mg total) rectally daily as needed for severe constipation. 05/04/23  Yes Alexander, Natalie, DO  cholecalciferol (VITAMIN D3) 25 MCG (1000 UNIT) tablet Take 5,000 Units by mouth every Monday.   Yes [provider]  clopidogrel  (PLAVIX ) 75 MG tablet Take 1 tablet (75 mg total) by mouth daily. 09/26/19  Yes Regalado, Belkys A, MD  cyanocobalamin  (VITAMIN B12) 1000 MCG tablet Take 1,000 mcg by mouth daily.   Yes [provider]  ferrous sulfate  220 (44 Fe) MG/5ML solution Take 220 mg by mouth every other day.   Yes [provider]  geriatric multivitamins-minerals (ELDERTONIC/GEVRABON) LIQD Take 15 mLs by mouth daily.   Yes [provider]  Glucagon , rDNA, (GLUCAGON  EMERGENCY) 1 MG KIT Inject 1 mg into the muscle once as needed (low blood sugar). 08/26/22  Yes [provider]  insulin  aspart (NOVOLOG ) 100 UNIT/ML injection CBG 70 - 120: 0 units CBG 121 - 150: 2 units CBG 151 - 200: 3 units CBG 201 - 250: 5 units CBG 251 - 300: 8 units CBG 301 - 350: 11 units CBG 351 - 400: 15 units CBG > 400: call MD 05/04/23  Yes Alexander, Natalie, DO  lisinopril  (ZESTRIL ) 20 MG tablet Take 20 mg by mouth daily. 08/01/22  Yes [provider]  magnesium  oxide (MAG-OX) 400 (240 Mg) MG tablet Take 400 mg by mouth daily.   Yes [provider]  naloxone (NARCAN) nasal spray 4 mg/0.1 mL Place 1 spray into the nose 3 (three) times daily as needed (opioid overodse).   Yes [provider]  oxyCODONE  (OXY IR/ROXICODONE ) 5 MG immediate release tablet Take 5 mg by mouth 3 (three) times daily as needed.    Yes [provider]  polyethylene glycol (MIRALAX  / GLYCOLAX ) 17 g packet Take 17 g by mouth daily.   Yes [provider]  SANTYL 250 UNIT/GM ointment Apply 1 Application topically daily. 11/11/23  Yes [provider]  Amino Acids -Protein Hydrolys (FEEDING SUPPLEMENT, PRO-STAT SUGAR FREE 64,) LIQD Take 30 mLs by mouth in the morning and at bedtime.    [provider]  feeding supplement, GLUCERNA SHAKE, (GLUCERNA SHAKE) LIQD Take 237 mLs by mouth 3 (three) times daily between meals. 05/04/23   Alexander, Natalie, DO      reports that she has never smoked. She has never used smokeless tobacco. She reports that she does not drink alcohol  and does not use drugs. Lives with son.  Tobacco- Denies use. EtOH- Denies use.  Illicit drug use- denies use.  IADLs/ADLs- needs help   Physical Exam: Vitals:   12/04/23 1136 12/04/23 1215 12/04/23 1721  BP: (!) 134/56  ROLLEN)  175/79  Pulse: 70  78  Resp: 16  15  Temp: 98 F (36.7 C)  97.6 F (36.4 C)  TempSrc: Oral  Oral  SpO2: 100% 100% 100%   Gen: NAD HENT: NCAT, dry MM CV: RR, good radial pulse Resp: CTAB Abd: No TTP, normal bowel sounds MSK: decrease muscle bulk diffusely Skin: left foot with toe wound and wound noted on side of foot that appears chronic Neuro: alert but oriented to slef Psych: pleasant mood   Labs on Admission: I have personally reviewed following labs and imaging studies  CBC: Recent Labs  Lab 12/04/23 1211  WBC 16.8*  NEUTROABS 14.1*  HGB 10.0*  HCT 33.6*  MCV 94.6  PLT 484*   Basic Metabolic Panel: Recent Labs  Lab 12/04/23 1211  NA 143  K 4.1  CL 102  CO2 27  GLUCOSE 192*  BUN 19  CREATININE 0.66  CALCIUM  9.1   GFR: CrCl cannot be calculated (Unknown ideal weight.). Liver Function Tests: Recent Labs  Lab 12/04/23 1211  AST 21  ALT 10  ALKPHOS 85  BILITOT 0.7  PROT 8.6*  ALBUMIN 2.9*   No results for input(s): LIPASE, AMYLASE in the last 168  hours. No results for input(s): AMMONIA in the last 168 hours. Coagulation Profile: No results for input(s): INR, PROTIME in the last 168 hours. Cardiac Enzymes: No results for input(s): CKTOTAL, CKMB, CKMBINDEX, TROPONINI, TROPONINIHS in the last 168 hours. BNP (last 3 results) No results for input(s): BNP in the last 8760 hours. HbA1C: No results for input(s): HGBA1C in the last 72 hours. CBG: No results for input(s): GLUCAP in the last 168 hours. Lipid Profile: No results for input(s): CHOL, HDL, LDLCALC, TRIG, CHOLHDL, LDLDIRECT in the last 72 hours. Thyroid  Function Tests: No results for input(s): TSH, T4TOTAL, FREET4, T3FREE, THYROIDAB in the last 72 hours. Anemia Panel: No results for input(s): VITAMINB12, FOLATE, FERRITIN, TIBC, IRON, RETICCTPCT in the last 72 hours. Urine analysis:    Component Value Date/Time   COLORURINE YELLOW (A) 05/03/2023 1329   APPEARANCEUR HAZY (A) 05/03/2023 1329   LABSPEC 1.020 05/03/2023 1329   PHURINE 5.0 05/03/2023 1329   GLUCOSEU NEGATIVE 05/03/2023 1329   HGBUR NEGATIVE 05/03/2023 1329   BILIRUBINUR NEGATIVE 05/03/2023 1329   BILIRUBINUR neg 08/08/2019 0922   KETONESUR NEGATIVE 05/03/2023 1329   PROTEINUR NEGATIVE 05/03/2023 1329   UROBILINOGEN 0.2 08/08/2019 0922   NITRITE NEGATIVE 05/03/2023 1329   LEUKOCYTESUR TRACE (A) 05/03/2023 1329    Radiological Exams on Admission: I have personally reviewed images CT FOOT LEFT W WO CONTRAST Result Date: 12/04/2023 CLINICAL DATA:  Provided history: Osteomyelitis suspected, foot, xray done EXAM: CT OF THE LOWER LEFT EXTREMITY WITHOUT CONTRAST TECHNIQUE: Multidetector CT imaging of the lower left extremity was performed following the standard protocol before and during bolus administration of intravenous contrast. RADIATION DOSE REDUCTION: This exam was performed according to the departmental dose-optimization program which includes automated  exposure control, adjustment of the mA and/or kV according to patient size and/or use of iterative reconstruction technique. CONTRAST:  100mL OMNIPAQUE  IOHEXOL  300 MG/ML  SOLN COMPARISON:  Radiograph earlier today FINDINGS: Patient with limited mobility, there is significant streak artifact from right knee replacement that is unable to be removed from the field of view. This significantly limits assessment. Bones/Joint/Cartilage The bones are diffusely under mineralized. Previous transmetatarsal amputation of the first ray. The fifth ray has been resected. There are destructive changes in the second toe distal and middle phalanx with air in  the distal phalanx and adjacent soft tissues. The distal phalanx is exposed to air. No other obvious bony destructive changes. No evidence of acute fracture. Pes planus is better demonstrated on radiograph. Ligaments Suboptimally assessed by CT. Muscles and Tendons Fatty atrophy of the intrinsic musculature of the foot. Soft tissues There is air in the soft tissues adjacent to the distal second digit, the distal digit is exposed to air. Circumferential soft tissue thickening of the toe. Fluid collection in the plantar foot subjacent to and distal to the resected first metatarsal measures approximately 2.7 x 1.5 x 1.8 cm, series 18, image 71. Soft tissue thickening extends distally with fluid surrounding the second toe into the plantar soft tissues, sterility indeterminate, series 18, image 54. Diffuse forefoot soft tissue thickening. Prominent vascular calcifications. IMPRESSION: 1. Osteomyelitis of the second toe distal and middle phalanx with air in the distal phalanx and adjacent soft tissues. The distal phalanx is exposed to air. 2. Fluid collection in the plantar foot subjacent to and distal to the resected first metatarsal measures approximately 2.7 x 1.5 x 1.8 cm, suspicious for abscess. Soft tissue thickening extends distally with fluid surrounding the second toe into the  plantar soft tissues, sterility indeterminate. 3. Previous transmetatarsal amputation of the first ray. The fifth ray has been resected. 4. Osteoporosis as well as streak artifact limits detailed CT assessment. Electronically Signed   By: Andrea Gasman M.D.   On: 12/04/2023 17:29   DG Foot Complete Left Result Date: 12/04/2023 EXAM: 3 or more VIEW(S) XRAY OF THE LEFT FOOT 12/04/2023 12:36:00 PM COMPARISON: 11/10/2023 CLINICAL HISTORY: infection. Pt was sent over from the wound center for wound check. Pt was there for a routine leg check and staff at wound care sent pt over because they are worried about gangrene and an abscess. Pt has dementia at baseline and hx of diabetes FINDINGS: BONES AND JOINTS: New erosion of the distal aspect of the second toe middle phalanx concerning for osteomyelitis. Erosion of the proximal aspect of the second toe distal phalanx. ossible erosive changes are also noted involving the istal aspect of the nd roximal phalanx. Prior transmetatarsal amputation of the first digit at the mid metatarsal. Status post previous resection of the 5th ray. . No joint dislocation. SOFT TISSUES: Extensive vascular calcification. Diffuse soft tissue swelling of the forefoot. IMPRESSION: 1. Findings concerning for osteomyelitis involving the second toe at the level of the proximal distal phalanx distal middle phalanx and distal aspect of the proximal phalanx. 2. Diffuse soft tissue edema and extensive vascular calcifications. 3. Diffuse osteopenia. Electronically signed by: Waddell Calk MD 12/04/2023 01:10 PM EDT RP Workstation: [id number]    EKG: My personal interpretation of EKG shows: normal sinus rhythm without any acute changes    Assessment/Plan Principal Problem:   Acute osteomyelitis of toe, left (HCC) Active Problems:   Type 2 diabetes mellitus with polyneuropathy (HCC)   Mixed hyperlipidemia   Essential hypertension   Atherosclerosis of native arteries of the extremities  with ulceration (HCC)   Osteomyelitis Patient with left foot infection that has appears to have progressed to osteomyelitis.  Initial imaging concerning for osteomyelitis. MRI contraindicated so CT foot ordered. ESR and CRP ordered. Podiatry consulted by EDP.  Appreciate recommendations.  Patient started zosyn  and vancomycin  for empiric coverage.  Will trend CBC, monitor fever curve.  Blood cultures ordered and pending. NPO after midnight order placed in case the needs surgical intervention tomorrow. Wound care consult placed. Will consult PT. Pain regimen ordered.  Chronic Problems:  HTN: restart home med HLD: restart statin, holding antiplatelet in case surgical intervention tomorrow. Left lower extremity with decreased perfusion which is at baseline.  T2DM: Start SSI   VTE prophylaxis:  SQ Heparin   Diet:HH Code Status:  DNR/DNI(Do NOT Intubate) Telemetry:  Admission status: Inpatient, Med-Surg Patient is from: Home Anticipated d/c is to: Home Anticipated d/c is in: 2-3 days   Family Communication: Updated at bedside  Consults called: None   Severity of Illness: The appropriate patient status for this patient is INPATIENT. Inpatient status is judged to be reasonable and necessary in order to provide the required intensity of service to ensure the patient's safety. The patient's presenting symptoms, physical exam findings, and initial radiographic and laboratory data in the context of their chronic comorbidities is felt to place them at high risk for further clinical deterioration. Furthermore, it is not anticipated that the patient will be medically stable for discharge from the hospital within 2 midnights of admission.   * I certify that at the point of admission it is my clinical judgment that the patient will require inpatient hospital care spanning beyond 2 midnights from the point of admission due to high intensity of service, high risk for further deterioration and high  frequency of surveillance required.DEWAINE Morene Bathe, MD [place] DEL. [facility]

## 2023-12-05 ENCOUNTER — Other Ambulatory Visit: Payer: Self-pay

## 2023-12-05 LAB — CBC
HCT: 27 % — ABNORMAL LOW (ref 36.0–46.0)
Hemoglobin: 8.3 g/dL — ABNORMAL LOW (ref 12.0–15.0)
MCH: 28.3 pg (ref 26.0–34.0)
MCHC: 30.7 g/dL (ref 30.0–36.0)
MCV: 92.2 fL (ref 80.0–100.0)
Platelets: 434 K/uL — ABNORMAL HIGH (ref 150–400)
RBC: 2.93 MIL/uL — ABNORMAL LOW (ref 3.87–5.11)
RDW: 15.9 % — ABNORMAL HIGH (ref 11.5–15.5)
WBC: 16.6 K/uL — ABNORMAL HIGH (ref 4.0–10.5)
nRBC: 0 % (ref 0.0–0.2)

## 2023-12-05 LAB — GLUCOSE, CAPILLARY
Glucose-Capillary: 105 mg/dL — ABNORMAL HIGH (ref 70–99)
Glucose-Capillary: 136 mg/dL — ABNORMAL HIGH (ref 70–99)
Glucose-Capillary: 90 mg/dL (ref 70–99)
Glucose-Capillary: 87 mg/dL (ref 70–99)

## 2023-12-05 LAB — BASIC METABOLIC PANEL WITH GFR
Anion gap: 15 (ref 5–15)
BUN: 19 mg/dL (ref 8–23)
CO2: 25 mmol/L (ref 22–32)
Calcium: 8.5 mg/dL — ABNORMAL LOW (ref 8.9–10.3)
Chloride: 103 mmol/L (ref 98–111)
Creatinine, Ser: 0.56 mg/dL (ref 0.44–1.00)
GFR, Estimated: >60 mL/min (ref >60)
Glucose, Bld: 117 mg/dL — ABNORMAL HIGH (ref 70–99)
Potassium: 3.4 mmol/L — ABNORMAL LOW (ref 3.5–5.1)
Sodium: 143 mmol/L (ref 135–145)

## 2023-12-05 LAB — C-REACTIVE PROTEIN: CRP: 16.6 mg/dL — ABNORMAL HIGH (ref <1.0)

## 2023-12-05 MED ORDER — ASPIRIN 81 MG PO TBEC
81.0000 mg | DELAYED_RELEASE_TABLET | Freq: Every day | ORAL | Status: DC
  Administered 2023-12-05 – 2023-12-12 (×8): 81 mg via ORAL
  Filled 2023-12-05 (×8): qty 1

## 2023-12-05 MED ORDER — HYDROCERIN EX CREA
TOPICAL_CREAM | Freq: Two times a day (BID) | CUTANEOUS | Status: DC
  Filled 2023-12-05 (×3): qty 113

## 2023-12-05 MED ORDER — INFLUENZA VAC SPLIT HIGH-DOSE 0.5 ML IM SUSY
0.5000 mL | PREFILLED_SYRINGE | INTRAMUSCULAR | Status: AC
  Administered 2023-12-06: 0.5 mL via INTRAMUSCULAR
  Filled 2023-12-05: qty 0.5

## 2023-12-05 MED ORDER — POTASSIUM CHLORIDE 20 MEQ PO PACK
40.0000 meq | PACK | Freq: Once | ORAL | Status: AC
  Administered 2023-12-05: 40 meq via ORAL
  Filled 2023-12-05: qty 2

## 2023-12-05 NOTE — Progress Notes (Addendum)
 Progress Note    [name]  FMW:[id number] DOB: 1933-10-06  DOA: 12/04/2023 PCP: Rudolpho Norleen BIRCH, MD      Brief Narrative:    Medical records reviewed and are as summarized below:  [name] is a 89 y.o. female with medical history significant for dementia, hypertension, hyperlipidemia, type II DM, PAD, CAD, stroke with left hemiparesis, history of left foot wounds, hospitalized from 11/06/2023 through 11/11/2023 for critical left lower extremity ischemia s/p angioplasty of left lower extremity on 11/06/2023.  She was referred from the wound care clinic to the ED for further evaluation of left foot wound because of concern for gangrene/abscess.    She was found to have osteomyelitis of the left foot.      Assessment/Plan:   Principal Problem:   Acute osteomyelitis of toe, left (HCC) Active Problems:   Type 2 diabetes mellitus with polyneuropathy (HCC)   Mixed hyperlipidemia   Essential hypertension   Atherosclerosis of native arteries of the extremities with ulceration (HCC)   Body mass index is 20.78 kg/m.  Left foot osteomyelitis, leukocytosis: Continue IV vancomycin  and Zosyn .  She has been evaluated by Dr. Tanda, podiatrist, who has gotten in touch with DSS to obtain consent for surgical intervention.   Hypertension: Continue lisinopril    CAD, PVD s/p left lower extremity angioplasty on 11/06/2023, history of stroke: Resume low-dose aspirin .  Continue Lipitor    History of stroke with left hemiparesis.  Patient is nonambulatory at baseline.   Comorbidities include hyperlipidemia, dementia, chronic anemia   Case discussed with Dr. Serene, vascular surgeon and Dr. Tanda, podiatrist.  Based on options presented by Dr. Tanda to DSS legal guardian, [name], has opted for left AKA.  Vascular surgery will plan for left AKA early next week.    Diet Order             Diet Heart Room service appropriate? Yes; Fluid consistency: Thin   Diet effective now                                  Consultants: Podiatrist  Procedures: None    Medications:    atorvastatin   80 mg Oral Daily   cyanocobalamin   1,000 mcg Oral Daily   heparin   5,000 Units Subcutaneous Q8H   hydrocerin   Topical BID   [START ON 12/06/2023] Influenza vac split trivalent PF  0.5 mL Intramuscular Tomorrow-1000   insulin  aspart  0-5 Units Subcutaneous QHS   insulin  aspart  0-9 Units Subcutaneous TID WC   lisinopril   20 mg Oral Daily   Continuous Infusions:  piperacillin -tazobactam (ZOSYN )  IV 3.375 g (12/05/23 0503)   vancomycin        Anti-infectives (From admission, onward)    Start     Dose/Rate Route Frequency Ordered Stop   12/05/23 1300  vancomycin  (VANCOREADY) IVPB 750 mg/150 mL        750 mg 150 mL/hr over 60 Minutes Intravenous Every 24 hours 12/04/23 2108     12/04/23 2100  piperacillin -tazobactam (ZOSYN ) IVPB 3.375 g        3.375 g 12.5 mL/hr over 240 Minutes Intravenous Every 8 hours 12/04/23 2028     12/04/23 1200  piperacillin -tazobactam (ZOSYN ) IVPB 3.375 g        3.375 g 100 mL/hr over 30 Minutes Intravenous  Once 12/04/23 1148 12/04/23 1255   12/04/23 1200  vancomycin  (VANCOREADY) IVPB 1500 mg/300 mL  1,500 mg 150 mL/hr over 120 Minutes Intravenous  Once 12/04/23 1151 12/04/23 1504              Family Communication/Anticipated D/C date and plan/Code Status   DVT prophylaxis: heparin  injection 5,000 Units Start: 12/04/23 2200 SCDs Start: 12/04/23 1403     Code Status: Limited: Do not attempt resuscitation (DNR) -DNR-LIMITED -Do Not Intubate/DNI   Family Communication: None Disposition Plan: Plan to discharge to SNF   Status is: Inpatient Remains inpatient appropriate because: Left foot osteomyelitis       Subjective:   Interval events noted.  She complains of pain in the left foot.  No other complaints.  She is confused and cannot provide an adequate  history.  Objective:    Vitals:   12/04/23 2101 12/04/23 2355 12/05/23 0458 12/05/23 0819  BP:  (!) 158/82 (!) 139/56 (!) 131/56  Pulse:  92 75 73  Resp:    16  Temp:  98.2 F (36.8 C) 98.1 F (36.7 C) 98.8 F (37.1 C)  TempSrc:      SpO2:  98% 100% 99%  Weight: 54.9 kg      No data found.   Intake/Output Summary (Last 24 hours) at 12/05/2023 1122 Last data filed at 12/05/2023 1100 Gross per 24 hour  Intake 350 ml  Output --  Net 350 ml   Filed Weights   12/04/23 2101  Weight: 54.9 kg    Exam:  GEN: NAD SKIN: Warm and dry EYES: No pallor or icterus ENT: MMM CV: RRR PULM: CTA B ABD: soft, ND, NT, +BS CNS: AAO x 1 (person), left hemiparesis with contracture of left upper and lower extremities EXT: Left foot tenderness.  Wounds on the toe of the left foot and left heel     Data Reviewed:   I have personally reviewed following labs and imaging studies:  Labs: Labs show the following:   Basic Metabolic Panel: Recent Labs  Lab 12/04/23 1211 12/05/23 0516  NA 143 143  K 4.1 3.4*  CL 102 103  CO2 27 25  GLUCOSE 192* 117*  BUN 19 19  CREATININE 0.66 0.56  CALCIUM  9.1 8.5*   GFR Estimated Creatinine Clearance: 42.8 mL/min (by C-G formula based on SCr of 0.56 mg/dL). Liver Function Tests: Recent Labs  Lab 12/04/23 1211  AST 21  ALT 10  ALKPHOS 85  BILITOT 0.7  PROT 8.6*  ALBUMIN 2.9*   No results for input(s): LIPASE, AMYLASE in the last 168 hours. No results for input(s): AMMONIA in the last 168 hours. Coagulation profile No results for input(s): INR, PROTIME in the last 168 hours.  CBC: Recent Labs  Lab 12/04/23 1211 12/05/23 0516  WBC 16.8* 16.6*  NEUTROABS 14.1*  --   HGB 10.0* 8.3*  HCT 33.6* 27.0*  MCV 94.6 92.2  PLT 484* 434*   Cardiac Enzymes: No results for input(s): CKTOTAL, CKMB, CKMBINDEX, TROPONINI in the last 168 hours. BNP (last 3 results) No results for input(s): PROBNP in the last 8760  hours. CBG: Recent Labs  Lab 12/04/23 2245 12/05/23 0821  GLUCAP 118* 105*   D-Dimer: No results for input(s): DDIMER in the last 72 hours. Hgb A1c: No results for input(s): HGBA1C in the last 72 hours. Lipid Profile: No results for input(s): CHOL, HDL, LDLCALC, TRIG, CHOLHDL, LDLDIRECT in the last 72 hours. Thyroid  function studies: No results for input(s): TSH, T4TOTAL, T3FREE, THYROIDAB in the last 72 hours.  Invalid input(s): FREET3 Anemia work up: No results for  input(s): VITAMINB12, FOLATE, FERRITIN, TIBC, IRON, RETICCTPCT in the last 72 hours. Sepsis Labs: Recent Labs  Lab 12/04/23 1211 12/05/23 0516  WBC 16.8* 16.6*  LATICACIDVEN 1.6  --     Microbiology Recent Results (from the past 240 hours)  Aerobic/Anaerobic Culture w Gram Stain (surgical/deep wound)     Status: None (Preliminary result)   Collection Time: 12/04/23  7:54 PM   Specimen: Foot; Wound  Result Value Ref Range Status   Specimen Description   Final    FOOT Performed at [facility], [address], [place], [place] [address]    Special Requests   Final    Immunocompromised Performed at [facility], [address] [place], [place] [address]    Gram Stain   Final    FEW WBC PRESENT,BOTH PMN AND MONONUCLEAR FEW GRAM POSITIVE COCCI Performed at [facility], [address], [place], [place] [address]    Culture PENDING  Incomplete   Report Status PENDING  Incomplete  Blood culture (routine x 2)     Status: None (Preliminary result)   Collection Time: 12/04/23  8:06 PM   Specimen: BLOOD  Result Value Ref Range Status   Specimen Description BLOOD BLOOD RIGHT FOREARM  Final   Special Requests   Final    BOTTLES DRAWN AEROBIC AND ANAEROBIC Blood Culture results may not be optimal due to an inadequate volume of blood received in culture bottles   Culture   Final    NO GROWTH < 12 HOURS Performed at [facility], [address] [place], [place] [address]    Report Status PENDING  Incomplete  Blood culture (routine x 2)     Status: None (Preliminary result)   Collection Time: 12/04/23  8:07 PM   Specimen: BLOOD  Result Value Ref Range Status   Specimen Description BLOOD BLOOD LEFT FOREARM  Final   Special Requests   Final    BOTTLES DRAWN AEROBIC AND ANAEROBIC Blood Culture results may not be optimal due to an inadequate volume of blood received in culture bottles   Culture   Final    NO GROWTH < 12 HOURS Performed at [facility], [address] [place], [place] [address]    Report Status PENDING  Incomplete    Procedures and diagnostic studies:  CT FOOT LEFT W WO CONTRAST Result Date: 12/04/2023 CLINICAL DATA:  Provided history: Osteomyelitis suspected, foot, xray done EXAM: CT OF THE LOWER LEFT EXTREMITY WITHOUT CONTRAST TECHNIQUE: Multidetector CT imaging of the lower left extremity was performed following the standard protocol before and during bolus administration of intravenous contrast. RADIATION DOSE REDUCTION: This exam was performed according to the departmental dose-optimization program which includes automated exposure control, adjustment of the mA and/or kV according to patient size and/or use of iterative reconstruction technique. CONTRAST:  OMNIPAQUE  IOHEXOL  300 MG/ML  SOLN COMPARISON:  Radiograph earlier today FINDINGS: Patient with limited mobility, there is significant streak artifact from right knee replacement that is unable to be removed from the field of view. This significantly limits assessment. Bones/Joint/Cartilage The bones are diffusely under mineralized. Previous transmetatarsal amputation of the first ray. The fifth ray has been resected. There are destructive changes in the second toe distal and middle phalanx with air in the distal phalanx and adjacent soft tissues. The distal phalanx is exposed to air. No other obvious bony destructive changes. No evidence  of acute fracture. Pes planus is better demonstrated on radiograph. Ligaments Suboptimally assessed by  CT. Muscles and Tendons Fatty atrophy of the intrinsic musculature of the foot. Soft tissues There is air in the soft tissues adjacent to the distal second digit, the distal digit is exposed to air. Circumferential soft tissue thickening of the toe. Fluid collection in the plantar foot subjacent to and distal to the resected first metatarsal measures approximately 2.7 x 1.5 x 1.8 cm, series 18, image 71. Soft tissue thickening extends distally with fluid surrounding the second toe into the plantar soft tissues, sterility indeterminate, series 18, image 54. Diffuse forefoot soft tissue thickening. Prominent vascular calcifications. IMPRESSION: 1. Osteomyelitis of the second toe distal and middle phalanx with air in the distal phalanx and adjacent soft tissues. The distal phalanx is exposed to air. 2. Fluid collection in the plantar foot subjacent to and distal to the resected first metatarsal measures approximately 2.7 x 1.5 x 1.8 cm, suspicious for abscess. Soft tissue thickening extends distally with fluid surrounding the second toe into the plantar soft tissues, sterility indeterminate. 3. Previous transmetatarsal amputation of the first ray. The fifth ray has been resected. 4. Osteoporosis as well as streak artifact limits detailed CT assessment. Electronically Signed   By: Andrea Gasman M.D.   On: 12/04/2023 17:29   DG Foot Complete Left Result Date: 12/04/2023 EXAM: 3 or more VIEW(S) XRAY OF THE LEFT FOOT 12/04/2023 12:36:00 PM COMPARISON: 11/10/2023 CLINICAL HISTORY: infection. Pt was sent over from the wound center for wound check. Pt was there for a routine leg check and staff at wound care sent pt over because they are worried about gangrene and an abscess. Pt has dementia at baseline and hx of diabetes FINDINGS: BONES AND JOINTS: New erosion of the distal aspect of the second toe middle phalanx  concerning for osteomyelitis. Erosion of the proximal aspect of the second toe distal phalanx. ossible erosive changes are also noted involving the istal aspect of the nd roximal phalanx. Prior transmetatarsal amputation of the first digit at the mid metatarsal. Status post previous resection of the 5th ray. . No joint dislocation. SOFT TISSUES: Extensive vascular calcification. Diffuse soft tissue swelling of the forefoot. IMPRESSION: 1. Findings concerning for osteomyelitis involving the second toe at the level of the proximal distal phalanx distal middle phalanx and distal aspect of the proximal phalanx. 2. Diffuse soft tissue edema and extensive vascular calcifications. 3. Diffuse osteopenia. Electronically signed by: Waddell Calk MD 12/04/2023 01:10 PM EDT RP Workstation: [id number]               LOS: 1 day   Donnald Tabar  [facility]   Pager on [email or web address]. If 7PM-7AM, please contact night-coverage at [email or web address]     12/05/2023, 11:22 AM

## 2023-12-05 NOTE — TOC Initial Note (Addendum)
 Transition of Care [facility]) - Initial/Assessment Note    Patient Details  Name: [name] MRN: [id number] Date of Birth: [birth year]  Transition of Care [facility]) CM/SW Contact:    Evelyn Aguinaldo L Sebastian Lurz, LCSW Phone Number: 12/05/2023, 11:32 AM  Clinical Narrative:                  [facility] consult received and unclear. Comments state that patient has multiple wounds but there is no context. Per chart review, patient has legal guardians with DSS.   DSS was contacted by [facility] for consents. DSS made aware that patient is in the hospital and current medical needs.        Patient Goals and CMS Choice            Expected Discharge Plan and Services                                              Prior Living Arrangements/Services                       Activities of Daily Living   ADL Screening (condition at time of admission) Independently performs ADLs?: No Does the patient have a NEW difficulty with bathing/dressing/toileting/self-feeding that is expected to last >3 days?: Yes (Initiates electronic notice to provider for possible OT consult) Does the patient have a NEW difficulty with getting in/out of bed, walking, or climbing stairs that is expected to last >3 days?: Yes (Initiates electronic notice to provider for possible PT consult) Does the patient have a NEW difficulty with communication that is expected to last >3 days?: Yes (Initiates electronic notice to provider for possible SLP consult) Is the patient deaf or have difficulty hearing?: No Does the patient have difficulty seeing, even when wearing glasses/contacts?: No Does the patient have difficulty concentrating, remembering, or making decisions?: Yes  Permission Sought/Granted                  Emotional Assessment              Admission diagnosis:  Gangrene of toe (HCC) [I96] Acute osteomyelitis of toe, left (HCC) [M86.172] Osteomyelitis, unspecified site, unspecified type [facility])  [M86.9] Patient Active Problem List   Diagnosis Date Noted   Acute osteomyelitis of toe, left (HCC) 12/04/2023   Critical limb ischemia of left lower extremity (HCC) 11/06/2023   Ischemic foot ulcer due to atherosclerosis of native artery of limb (HCC) 11/06/2023   Protein-calorie malnutrition, severe 04/30/2023   Toe gangrene (HCC) 04/28/2023   Dementia (HCC) 04/09/2023   DJD (degenerative joint disease) 01/21/2023   Ischemia of left lower extremity 08/27/2022   Osteomyelitis of left foot (HCC) 08/27/2022   Gangrene of toe of left foot (HCC) 08/26/2022   Acute osteomyelitis of left foot (HCC) 08/08/2022   Diabetic foot infection (HCC) 08/07/2022   Hemiplegia affecting left side in left-dominant patient as late effect of cerebrovascular disease (HCC) 01/10/2022   Vaginal candidiasis 01/10/2022   Leg wound, left 01/08/2022   Hypoglycemia 01/07/2022   Leukocytosis 01/07/2022   MRI contraindicated due to metal implant 01/07/2022   Open wound of left foot 01/07/2022   Atherosclerosis of native arteries of the extremities with ulceration (HCC) 06/06/2021   Pressure injury of skin 06/01/2021   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   PAD (peripheral artery disease) 05/17/2021   Right hemiparesis (HCC) 05/16/2021  Cellulitis of left foot 05/15/2021   Hypokalemia 05/15/2021   History of stroke 10/07/2019   Altered mental status 10/06/2019   Carotid artery disease 09/28/2019   Left hemiparesis (HCC) 09/28/2019   Hemiplegia and hemiparesis following cerebral infarction affecting right dominant side (HCC) 09/27/2019   Primary osteoarthritis of both hips 11/06/2017   Microalbuminuria 11/02/2017   Cystocele, midline 09/04/2017   Incomplete uterine prolapse 09/04/2017   Ankle edema 04/27/2017   Malnutrition of moderate degree 01/15/2016   Dehydration    Incarcerated ventral hernia    Diabetic ketoacidosis without coma associated with type 2 diabetes mellitus (HCC)    Type 2 diabetes mellitus  with polyneuropathy (HCC) 08/06/2015   Carpal tunnel syndrome, bilateral 07/10/2015   Osteopenia of multiple sites 07/10/2015   Arthralgia of multiple sites 06/27/2015   Mixed hyperlipidemia 11/06/2014   Hypomagnesemia 11/02/2013   Essential hypertension 11/02/2013   PCP:  Rudolpho Norleen BIRCH, MD Pharmacy:   [facility]. - [place], [facility] - [address] [place] [address] Phone: [phone] Fax: [phone]     Social Drivers of Health (SDOH) Social History: SDOH Screenings   Food Insecurity: Patient Unable To Answer (12/05/2023)  Housing: Unknown (12/05/2023)  Transportation Needs: Patient Unable To Answer (12/05/2023)  Utilities: Patient Unable To Answer (12/05/2023)  Social Connections: Patient Unable To Answer (12/05/2023)  Tobacco Use: Low Risk  (12/04/2023)   SDOH Interventions:     Readmission Risk Interventions    05/01/2023    4:26 PM  Readmission Risk Prevention Plan  Transportation Screening Complete  HRI or Home Care Consult Complete  Social Work Consult for Recovery Care Planning/Counseling Complete  Medication Review Oceanographer) Complete

## 2023-12-05 NOTE — Consult Note (Addendum)
 WOC Nurse Consult Note: Reason for Consult: Requested to assess multiple injuries on L foot. Wound type: DFU: plantar ulcer (neuropathic), 2nd toe and heel. Pressure Injury POA: NA Measurement: see flowsheet Wound bed: non granulate tissue, 100% yellow slough. Drainage (amount, consistency, odor) Moderate amount. Note: last podiatry assessment 10/24 shows stable gangrene and no signs of osteo on x-ray imaging, no acute infection. Periwound: intact, partial callus on heel. Dressing procedure/placement/frequency: Cleanse with Vashe [id number], not rinse, pat dry. Moist the skin with Eurecin. Apply Aquacel [phone] to the wound beds. Cover with foam dressing or gauze and wrap with Kerlix and ACE wrap. Change daily.  WOC team will not plan to follow further. Please reconsult if further assistance is needed. Thank-you,  Lela Holm RN, CNS, [employer], MSN.  (Phone [phone])

## 2023-12-05 NOTE — Plan of Care (Signed)
  Problem: Education: Goal: Ability to describe self-care measures that may prevent or decrease complications (Diabetes Survival Skills Education) will improve Outcome: Not Progressing Goal: Individualized Educational Video(s) Outcome: Not Progressing   Problem: Coping: Goal: Ability to adjust to condition or change in health will improve Outcome: Progressing   Problem: Fluid Volume: Goal: Ability to maintain a balanced intake and output will improve Outcome: Not Progressing   Problem: Health Behavior/Discharge Planning: Goal: Ability to identify and utilize available resources and services will improve Outcome: Not Progressing Goal: Ability to manage health-related needs will improve Outcome: Not Progressing   Problem: Metabolic: Goal: Ability to maintain appropriate glucose levels will improve Outcome: Not Progressing   Problem: Nutritional: Goal: Maintenance of adequate nutrition will improve Outcome: Not Progressing Goal: Progress toward achieving an optimal weight will improve Outcome: Not Progressing   Problem: Skin Integrity: Goal: Risk for impaired skin integrity will decrease Outcome: Not Progressing

## 2023-12-05 NOTE — Progress Notes (Addendum)
 PODIATRY: PROGRESS NOTE     O/N: [name] ; admitted ; patient remained NPO.   Subjective:  Patient resting comfortably in bed.  Denies F/C/N/V/SOB/CP.   I spoke with the DSS supervisor, Ms. [name] who helped shed light on patient current condition.  She relays that the patient is currently nonambulatory and transports in wheelchair at current facility.  She also relays difficulty with wound care adherence and follow-up between facility and wound care center or podiatry office.  When DSS services have been by to check on patient at facility, she she reports that at times no dressings or offloading measures are applied to the patient foot.  This has been documented on their end.  Son of patient will intermittently visit the patient, and relay what he believes the patient's preference or his preference would be for medical care, but according to DSS, consent and treatment as directed by their services.   PHYSICAL EXAMINATION: BP (!) 131/56 (BP Location: Right Arm)   Pulse 73   Temp 98.8 F (37.1 C)   Resp 16   Wt 54.9 kg   SpO2 99%   BMI 20.78 kg/m ? GEN: NAD. Disoriented, can answer questions intermittently but is not oriented to time/ place/ situation.  RESP: Non-labored breathing on RA.? ABD: NT/ND of all four quadrants.? NEURO: Moving all four extremities spontaneously. ? ? FOCUSED LOWER EXTREMITY EXAMINATION:    Neurological:  - Protective sensation diminished / absent - Gross protective sensation diminished bilaterally.  - No focal motor or sensory deficits identified bilaterally.    Vascular:  - Dorsalis Pedis artery on palpation: Unable to palpate - Posterior Tibial artery on palpation: Unable to palpate - Capillary Filling Times: Sluggish - Peripheral edema: Localized to left foot, mild     Musculoskeletal:  Significantly contracted LEFT LE with hyperflexion of the knee.  Ankle Joint ROM: decreased, equinus noted Global foot - TTP: 2nd digit no TTP, TTP to plantar  forefoot.  TTP to posterior heel wound.   Dermatological:  - Skin quality: atrophic      #WOUND 1    Type: Full-thickness, arterial ischemic ulceration Location: Distal second digit, left foot --- Wound base:100% % Necrotic  Adjacent tissue/ Wound borders: Rolled, macerated Undermining noted (Pre-debridement): Yes; Direction: Tracking proximally PTB: Yes, bone exposed Malodor: Yes Active Drainage: Yes; If yes, consistency: Purulence to the plantar subsecond met /plantar first intermetatarsal space     #WOUND 2  Type: Full-thickness, arterial ischemic ulceration /pressure ulcer stage IV Location: Posterior lateral heel, left foot --- Wound base:20% Necrotic 60% fibrotic 20% granular Adjacent tissue/ Wound borders: Rolled, macerated Undermining noted (Pre-debridement): No PTB: Yes Malodor: Yes Active Drainage: No; If yes, consistency: N/A    Results for orders placed or performed during the hospital encounter of 12/04/23  Aerobic/Anaerobic Culture w Gram Stain (surgical/deep wound)     Status: None (Preliminary result)   Collection Time: 12/04/23  7:54 PM   Specimen: Foot; Wound  Result Value Ref Range Status   Specimen Description   Final    FOOT Performed at [facility], [address], [place], [place] [address]    Special Requests   Final    Immunocompromised Performed at [facility], [address] [place], [place] [address]    Gram Stain   Final    FEW WBC PRESENT,BOTH PMN AND MONONUCLEAR FEW GRAM POSITIVE COCCI Performed at [facility], [address], [place], [place] [address]    Culture PENDING  Incomplete  Report Status PENDING  Incomplete  Blood culture (routine x 2)     Status: None (Preliminary result)   Collection Time: 12/04/23  8:06 PM   Specimen: BLOOD  Result Value Ref Range Status   Specimen Description BLOOD BLOOD RIGHT FOREARM  Final   Special Requests   Final    BOTTLES DRAWN AEROBIC AND ANAEROBIC Blood  Culture results may not be optimal due to an inadequate volume of blood received in culture bottles   Culture   Final    NO GROWTH < 12 HOURS Performed at [facility], [address] [place], [place] [address]    Report Status PENDING  Incomplete  Blood culture (routine x 2)     Status: None (Preliminary result)   Collection Time: 12/04/23  8:07 PM   Specimen: BLOOD  Result Value Ref Range Status   Specimen Description BLOOD BLOOD LEFT FOREARM  Final   Special Requests   Final    BOTTLES DRAWN AEROBIC AND ANAEROBIC Blood Culture results may not be optimal due to an inadequate volume of blood received in culture bottles   Culture   Final    NO GROWTH < 12 HOURS Performed at [facility], [address] [place], [place] [address]    Report Status PENDING  Incomplete    Assessment   -Diabetic foot infection, left foot -Acute osteomyelitis, left foot -Pressure ulcer with suspected necrosis to bone, left foot     Patient was evaluated in bed this morning.  She appears to be able to participate in conversation a little bit more with the provider.  We discussed treatment goals and patient preference.  We discussed potential interventions involving myself or other services (i.e. wound care, vascular), patient's preferences just 'to be able to go home' and to 'stop having problems on this left leg/foot.'  She is able to respond yes and no to questions and is amiable however provider is not confident that patient fully understands her situation or implications of specific treatments.   I discussed with the DSS supervisor this morning, Ms. [name], regarding patient case.  I relayed that there are 3 options available for infection control measures at this current time.  1) podiatry to conduct second partial ray resection with I&D, and patient will require local wound care measures continued for incision site and remaining heel wound. 2) podiatry to conduct TMA, and  patient will require local wound care measures continued for incision site and remaining heel wound. 3) vascular service to be consulted for possible BKA versus AKA.   I believe the 2nd or 3rd option are the most viable forms of more definitive treatment -patient will have difficulty with potential open wound and compliance with dressing changes, healing the distal site.I relayed my concern with compliance with wound care and ability to dress/offload the foot and remaining heel wound -given the depth of the heel wound and places there is to be to bone.   Should DSS consensus be for TMA + heel wound care, I advised that they have to be extremely cautious with local wound care measures and consistent with the heel wound given its appearance (though it does not look acutely infected, solely large and deep).     Plan   - ABX: Appreciate Medicine / ID / Pharmacy assist with antibiotic stewardship and appropriate coverage.  -Follow-up wound culture; obtained 12/04/2023, order placed - WBC 16.6 ; ESR >140 ;CRP 16/6 - Activity: NWB to left lower extremity ; Heel  offloading and pressure offloading measures in place. (Patient must be in Prevalon heel offloading boot while in bed on the left lower extremity versus heel offloading measures on pillows ----given patient contracted state this might be easier) - Wound Care: Daily betadine  moistened gauze to all open wound surfaces with overlying dry gauze, kerlix (loose), + tape in interim of definitive intervention.    Anti-infectives (From admission, onward)    Start     Dose/Rate Route Frequency Ordered Stop   12/05/23 1300  vancomycin  (VANCOREADY) IVPB 750 mg/150 mL        750 mg 150 mL/hr over 60 Minutes Intravenous Every 24 hours 12/04/23 2108     12/04/23 2100  piperacillin -tazobactam (ZOSYN ) IVPB 3.375 g        3.375 g 12.5 mL/hr over 240 Minutes Intravenous Every 8 hours 12/04/23 2028     12/04/23 1200  piperacillin -tazobactam (ZOSYN ) IVPB 3.375 g         3.375 g 100 mL/hr over 30 Minutes Intravenous  Once 12/04/23 1148 12/04/23 1255   12/04/23 1200  vancomycin  (VANCOREADY) IVPB 1500 mg/300 mL        1,500 mg 150 mL/hr over 120 Minutes Intravenous  Once 12/04/23 1151 12/04/23 1504       - Dispo: pending intervention choice + resolution of leukocytosis.    Addendum: Plan *Conversation with DSS at 1:50 PM, relayed their consensus for intervention of more proximal amputation.  Finalize consent will need to be obtained from legal guardian, interior and spatial designer of DSS Candace Goble -number in chart.  - Vascular services are aware. - Podiatry to sign off at this time.

## 2023-12-05 NOTE — Progress Notes (Signed)
 PT Cancellation Note  Patient Details Name: [name] MRN: [id number] DOB: 10-17-1933   Cancelled Treatment:    Reason Eval/Treat Not Completed: PT screened, no needs identified, will sign off Orders received, chart reviewed. Per PT/OT evaluation in 10/2023, patient has been non-ambulatory for years and facility uses hoyer lift to lift patient to/from w/c. Patient is also dependent with ADLs by staff at facility. No skilled PT needs warranted at this time. PT will screen and sign off.   Maryanne Finder, PT, DPT Physical Therapist - [place]  [facility]   [name] [name] 12/05/2023, 8:08 AM

## 2023-12-06 DIAGNOSIS — S91302A Unspecified open wound, left foot, initial encounter: Secondary | ICD-10-CM

## 2023-12-06 DIAGNOSIS — F0390 Unspecified dementia without behavioral disturbance: Secondary | ICD-10-CM

## 2023-12-06 DIAGNOSIS — Z7409 Other reduced mobility: Secondary | ICD-10-CM

## 2023-12-06 LAB — GLUCOSE, CAPILLARY
Glucose-Capillary: 64 mg/dL — ABNORMAL LOW (ref 70–99)
Glucose-Capillary: 93 mg/dL (ref 70–99)
Glucose-Capillary: 141 mg/dL — ABNORMAL HIGH (ref 70–99)
Glucose-Capillary: 109 mg/dL — ABNORMAL HIGH (ref 70–99)
Glucose-Capillary: 103 mg/dL — ABNORMAL HIGH (ref 70–99)

## 2023-12-06 LAB — RENAL FUNCTION PANEL
Albumin: 2.1 g/dL — ABNORMAL LOW (ref 3.5–5.0)
Anion gap: 11 (ref 5–15)
BUN: 18 mg/dL (ref 8–23)
CO2: 23 mmol/L (ref 22–32)
Calcium: 8.1 mg/dL — ABNORMAL LOW (ref 8.9–10.3)
Chloride: 108 mmol/L (ref 98–111)
Creatinine, Ser: 0.55 mg/dL (ref 0.44–1.00)
GFR, Estimated: >60 mL/min (ref >60)
Glucose, Bld: 69 mg/dL — ABNORMAL LOW (ref 70–99)
Phosphorus: 3.4 mg/dL (ref 2.5–4.6)
Potassium: 4 mmol/L (ref 3.5–5.1)
Sodium: 142 mmol/L (ref 135–145)

## 2023-12-06 LAB — CBC
HCT: 26.3 % — ABNORMAL LOW (ref 36.0–46.0)
Hemoglobin: 7.8 g/dL — ABNORMAL LOW (ref 12.0–15.0)
MCH: 28.3 pg (ref 26.0–34.0)
MCHC: 29.7 g/dL — ABNORMAL LOW (ref 30.0–36.0)
MCV: 95.3 fL (ref 80.0–100.0)
Platelets: 388 K/uL (ref 150–400)
RBC: 2.76 MIL/uL — ABNORMAL LOW (ref 3.87–5.11)
RDW: 16 % — ABNORMAL HIGH (ref 11.5–15.5)
WBC: 14.5 K/uL — ABNORMAL HIGH (ref 4.0–10.5)
nRBC: 0 % (ref 0.0–0.2)

## 2023-12-06 LAB — MAGNESIUM: Magnesium: 2.1 mg/dL (ref 1.7–2.4)

## 2023-12-06 NOTE — Consult Note (Signed)
 [facility] [place]  Patient name: [name] MRN: [id number] DOB: 11-16-1933 Sex: female   REQUESTING PROVIDER:   Hospital service   REASON FOR CONSULT:    Left foot osteomyelitis  HISTORY OF PRESENT ILLNESS:   [name] is a 89 y.o. female, who was admitted on 12/04/2023 with a left toe wound.  She had a white blood cell count of 16,000.  X-rays were concerning for osteomyelitis.  She was seen by podiatry.  Request has been made for more proximal amputation.  The patient lives with her son.  She suffers from dementia.  She is not ambulatory.  She denies any pain.  PAST MEDICAL HISTORY    Past Medical History:  Diagnosis Date   Carpal tunnel syndrome on both sides    CVA (cerebral vascular accident) (HCC) 11/14/2019   Diabetes (HCC)    Diabetic polyneuropathy (HCC)    Diverticulitis    Hyperlipidemia    Hypertension    Osteoarthritis    Sepsis (HCC) 03/22/2018   Suspected UTI 04/09/2023   UTI (urinary tract infection) 09/20/2019   Vaginal prolapse      FAMILY HISTORY   Family History  Problem Relation Age of Onset   Breast cancer Mother 33   Breast cancer Maternal Aunt     SOCIAL HISTORY:   Social History   Socioeconomic History   Marital status: Widowed    Spouse name: Not on file   Number of children: Not on file   Years of education: Not on file   Highest education level: Not on file  Occupational History   Not on file  Tobacco Use   Smoking status: Never   Smokeless tobacco: Never  Vaping Use   Vaping status: Never Used  Substance and Sexual Activity   Alcohol  use: No   Drug use: No   Sexual activity: Not Currently  Other Topics Concern   Not on file  Social History Narrative   Lives at home by herself. Ambulates with a cane.   Social Drivers of Corporate Investment Banker Strain: Not on file  Food Insecurity: Patient Unable To Answer (12/05/2023)   Hunger Vital  Sign    Worried About Running Out of Food in the Last Year: Patient unable to answer    Ran Out of Food in the Last Year: Patient unable to answer  Transportation Needs: Patient Unable To Answer (12/05/2023)   PRAPARE - Transportation    Lack of Transportation (Medical): Patient unable to answer    Lack of Transportation (Non-Medical): Patient unable to answer  Physical Activity: Not on file  Stress: Not on file  Social Connections: Patient Unable To Answer (12/05/2023)   Social Connection and Isolation Panel    Frequency of Communication with Friends and Family: Patient unable to answer    Frequency of Social Gatherings with Friends and Family: Patient unable to answer    Attends Religious Services: Patient unable to answer    Active Member of Clubs or Organizations: Patient unable to answer    Attends Banker Meetings: Patient unable to answer    Marital Status: Patient unable to answer  Intimate Partner Violence: Patient Unable To Answer (12/05/2023)   Humiliation, Afraid, Rape, and Kick questionnaire    Fear of Current or Ex-Partner: Patient unable to answer    Emotionally Abused: Patient unable to answer    Physically Abused: Patient unable to answer    Sexually Abused: Patient unable to answer  ALLERGIES:    Allergies  Allergen Reactions   Metformin  And Related Nausea And Vomiting    CURRENT MEDICATIONS:    Current Facility-Administered Medications  Medication Dose Route Frequency Provider Last Rate Last Admin   acetaminophen  (TYLENOL ) tablet 650 mg  650 mg Oral Q6H PRN Fernand Prost, MD       Or   acetaminophen  (TYLENOL ) suppository 650 mg  650 mg Rectal Q6H PRN Fernand Prost, MD       aspirin  EC tablet 81 mg  81 mg Oral Daily Jens Durand, MD   81 mg at 12/06/23 9144   atorvastatin  (LIPITOR ) tablet 80 mg  80 mg Oral Daily Fernand Prost, MD   80 mg at 12/06/23 9081   cyanocobalamin  (VITAMIN B12) tablet 1,000 mcg  1,000 mcg Oral Daily Fernand Prost, MD    1,000 mcg at 12/06/23 0855   heparin  injection 5,000 Units  5,000 Units Subcutaneous Q8H Fernand Prost, MD   5,000 Units at 12/06/23 0500   hydrocerin (EUCERIN) cream   Topical BID Jens Durand, MD   Given at 12/06/23 0919   HYDROmorphone  (DILAUDID ) injection 0.5 mg  0.5 mg Intravenous Q4H PRN Fernand Prost, MD       insulin  aspart (novoLOG ) injection 0-5 Units  0-5 Units Subcutaneous QHS Fernand Prost, MD       insulin  aspart (novoLOG ) injection 0-9 Units  0-9 Units Subcutaneous TID WC Fernand Prost, MD   1 Units at 12/05/23 1319   lisinopril  (ZESTRIL ) tablet 20 mg  20 mg Oral Daily Fernand Prost, MD   20 mg at 12/06/23 9081   oxyCODONE  (Oxy IR/ROXICODONE ) immediate release tablet 5 mg  5 mg Oral Q4H PRN Fernand Prost, MD       piperacillin -tazobactam (ZOSYN ) IVPB 3.375 g  3.375 g Intravenous Q8H Niels Kayla FALCON, RPH 12.5 mL/hr at 12/06/23 0500 3.375 g at 12/06/23 0500   senna-docusate (Senokot-S) tablet 1 tablet  1 tablet Oral QHS PRN Fernand Prost, MD       vancomycin  LUCRECIA) IVPB 750 mg/150 mL  750 mg Intravenous Q24H Niels Kayla FALCON, RPH 150 mL/hr at 12/05/23 1322 750 mg at 12/05/23 1322    REVIEW OF SYSTEMS:   [X]  denotes positive finding, [ ]  denotes negative finding Cardiac  Comments:  Chest pain or chest pressure:    Shortness of breath upon exertion:    Short of breath when lying flat:    Irregular heart rhythm:        Vascular    Pain in calf, thigh, or hip brought on by ambulation:    Pain in feet at night that wakes you up from your sleep:     Blood clot in your veins:    Leg swelling:         Pulmonary    Oxygen at home:    Productive cough:     Wheezing:         Neurologic    Sudden weakness in arms or legs:     Sudden numbness in arms or legs:     Sudden onset of difficulty speaking or slurred speech:    Temporary loss of vision in one eye:     Problems with dizziness:         Gastrointestinal    Blood in stool:      Vomited blood:          Genitourinary    Burning when urinating:     Blood in urine:  Psychiatric    Major depression:         Hematologic    Bleeding problems:    Problems with blood clotting too easily:        Skin    Rashes or ulcers:        Constitutional    Fever or chills:     PHYSICAL EXAM:   Vitals:   12/05/23 1538 12/05/23 1952 12/06/23 0401 12/06/23 0852  BP: (!) 140/58 (!) 151/52 (!) 152/64 (!) 147/53  Pulse: 69 73 72 64  Resp: 16 17 18 16   Temp: 97.8 F (36.6 C) 97.8 F (36.6 C) (!) 97.4 F (36.3 C) 97.8 F (36.6 C)  TempSrc:      SpO2: 99% 100% 100% 100%  Weight:        GENERAL: The patient is a well-nourished female, in no acute distress. The vital signs are documented above. CARDIAC: There is a regular rate and rhythm.  PULMONARY: Nonlabored respirations ABDOMEN: Soft and non-tender MUSCULOSKELETAL: Chronic contracture of the left knee  NEUROLOGIC: No focal weakness or paresthesias are detected. SKIN: See photos below PSYCHIATRIC: The patient has a normal affect.     STUDIES:   I have reviewed the following CT: 1. Osteomyelitis of the second toe distal and middle phalanx with air in the distal phalanx and adjacent soft tissues. The distal phalanx is exposed to air. 2. Fluid collection in the plantar foot subjacent to and distal to the resected first metatarsal measures approximately 2.7 x 1.5 x 1.8 cm, suspicious for abscess. Soft tissue thickening extends distally with fluid surrounding the second toe into the plantar soft tissues, sterility indeterminate. 3. Previous transmetatarsal amputation of the first ray. The fifth ray has been resected. 4. Osteoporosis as well as streak artifact limits detailed CT assessment.  ASSESSMENT and PLAN   Osteomyelitis with chronic wounds to the left foot: The patient suffers from dementia and has a chronic contracture in her left leg and is nonambulatory.  With osteomyelitis, the options would the palliative care,  long-term antibiotics or above-knee amputation.  It sounds like social services and the family are electing to proceed with above-knee amputation.  We will continue to confirm there wishes and if amputation is desired, this can be done early next week   Malvina New, IV, MD, FACS [facility]  Pager [phone]

## 2023-12-06 NOTE — Progress Notes (Signed)
 Progress Note    [name]  FMW:[id number] DOB: 1933/09/13  DOA: 12/04/2023 PCP: Rudolpho Norleen BIRCH, MD      Brief Narrative:    Medical records reviewed and are as summarized below:  [name] is a 89 y.o. female with medical history significant for dementia, hypertension, hyperlipidemia, type II DM, PAD, CAD, stroke with left hemiparesis, history of left foot wounds, hospitalized from 11/06/2023 through 11/11/2023 for critical left lower extremity ischemia s/p angioplasty of left lower extremity on 11/06/2023.  She was referred from the wound care clinic to the ED for further evaluation of left foot wound because of concern for gangrene/abscess.    She was found to have osteomyelitis of the left foot.      Assessment/Plan:   Principal Problem:   Acute osteomyelitis of toe, left (HCC) Active Problems:   Type 2 diabetes mellitus with polyneuropathy (HCC)   Mixed hyperlipidemia   Essential hypertension   Atherosclerosis of native arteries of the extremities with ulceration (HCC)   Body mass index is 20.78 kg/m.  Left foot osteomyelitis, leukocytosis: Continue IV Zosyn  and vancomycin .  Vascular surgeon plans to perform left AKA early this week.   Hypertension: Continue lisinopril    Mild hypoglycemia: Asymptomatic.  Encourage oral intake.  Monitor glucose levels closely and treat accordingly.   CAD, PVD s/p left lower extremity angioplasty on 11/06/2023, history of stroke: Continue aspirin  and Lipitor    History of stroke with left hemiparesis.  Patient is nonambulatory at baseline.   Comorbidities include hyperlipidemia, dementia, chronic anemia     Diet Order             Diet Heart Room service appropriate? Yes; Fluid consistency: Thin  Diet effective now                                  Consultants: Podiatrist Vascular surgeon  Procedures: None    Medications:    aspirin  EC  81 mg Oral Daily   atorvastatin    80 mg Oral Daily   cyanocobalamin   1,000 mcg Oral Daily   heparin   5,000 Units Subcutaneous Q8H   hydrocerin   Topical BID   Influenza vac split trivalent PF  0.5 mL Intramuscular Tomorrow-1000   insulin  aspart  0-5 Units Subcutaneous QHS   insulin  aspart  0-9 Units Subcutaneous TID WC   lisinopril   20 mg Oral Daily   Continuous Infusions:  piperacillin -tazobactam (ZOSYN )  IV 3.375 g (12/06/23 0500)   vancomycin  750 mg (12/05/23 1322)     Anti-infectives (From admission, onward)    Start     Dose/Rate Route Frequency Ordered Stop   12/05/23 1300  vancomycin  (VANCOREADY) IVPB 750 mg/150 mL        750 mg 150 mL/hr over 60 Minutes Intravenous Every 24 hours 12/04/23 2108     12/04/23 2100  piperacillin -tazobactam (ZOSYN ) IVPB 3.375 g        3.375 g 12.5 mL/hr over 240 Minutes Intravenous Every 8 hours 12/04/23 2028     12/04/23 1200  piperacillin -tazobactam (ZOSYN ) IVPB 3.375 g        3.375 g 100 mL/hr over 30 Minutes Intravenous  Once 12/04/23 1148 12/04/23 1255   12/04/23 1200  vancomycin  (VANCOREADY) IVPB 1500 mg/300 mL        1,500 mg 150 mL/hr over 120 Minutes Intravenous  Once 12/04/23 1151 12/04/23 1504  Family Communication/Anticipated D/C date and plan/Code Status   DVT prophylaxis: heparin  injection 5,000 Units Start: 12/04/23 2200 SCDs Start: 12/04/23 1403     Code Status: Limited: Do not attempt resuscitation (DNR) -DNR-LIMITED -Do Not Intubate/DNI   Family Communication: None Disposition Plan: Plan to discharge to SNF   Status is: Inpatient Remains inpatient appropriate because: Left foot osteomyelitis       Subjective:   Interval events noted.  Glucose went down to 64 this morning.  No complaints.  She is confused at baseline.  Objective:    Vitals:   12/05/23 1538 12/05/23 1952 12/06/23 0401 12/06/23 0852  BP: (!) 140/58 (!) 151/52 (!) 152/64 (!) 147/53  Pulse: 69 73 72 64  Resp: 16 17 18 16   Temp: 97.8 F (36.6 C) 97.8  F (36.6 C) (!) 97.4 F (36.3 C) 97.8 F (36.6 C)  TempSrc:      SpO2: 99% 100% 100% 100%  Weight:       No data found.   Intake/Output Summary (Last 24 hours) at 12/06/2023 1038 Last data filed at 12/06/2023 0900 Gross per 24 hour  Intake 120 ml  Output --  Net 120 ml   Filed Weights   12/04/23 2101  Weight: 54.9 kg    Exam:  GEN: NAD SKIN: Warm and dry EYES: No pallor or icterus ENT: MMM CV: RRR PULM: CTA B ABD: soft, ND, NT, +BS CNS: AAO x 1 (person), left hemiparesis with contractures with left upper and lower extremities EXT: Dressing on left foot is clean, dry and intact.  Left foot tenderness.    Data Reviewed:   I have personally reviewed following labs and imaging studies:  Labs: Labs show the following:   Basic Metabolic Panel: Recent Labs  Lab 12/04/23 1211 12/05/23 0516 12/06/23 0338  NA 143 143 142  K 4.1 3.4* 4.0  CL 102 103 108  CO2 27 25 23   GLUCOSE 192* 117* 69*  BUN 19 19 18   CREATININE 0.66 0.56 0.55  CALCIUM  9.1 8.5* 8.1*  MG  --   --  2.1  PHOS  --   --  3.4   GFR Estimated Creatinine Clearance: 42.8 mL/min (by C-G formula based on SCr of 0.55 mg/dL). Liver Function Tests: Recent Labs  Lab 12/04/23 1211 12/06/23 0338  AST 21  --   ALT 10  --   ALKPHOS 85  --   BILITOT 0.7  --   PROT 8.6*  --   ALBUMIN 2.9* 2.1*   No results for input(s): LIPASE, AMYLASE in the last 168 hours. No results for input(s): AMMONIA in the last 168 hours. Coagulation profile No results for input(s): INR, PROTIME in the last 168 hours.  CBC: Recent Labs  Lab 12/04/23 1211 12/05/23 0516 12/06/23 0338  WBC 16.8* 16.6* 14.5*  NEUTROABS 14.1*  --   --   HGB 10.0* 8.3* 7.8*  HCT 33.6* 27.0* 26.3*  MCV 94.6 92.2 95.3  PLT 484* 434* 388   Cardiac Enzymes: No results for input(s): CKTOTAL, CKMB, CKMBINDEX, TROPONINI in the last 168 hours. BNP (last 3 results) No results for input(s): PROBNP in the last 8760  hours. CBG: Recent Labs  Lab 12/05/23 1157 12/05/23 1539 12/05/23 2122 12/06/23 0856 12/06/23 0930  GLUCAP 136* 90 87 64* 93   D-Dimer: No results for input(s): DDIMER in the last 72 hours. Hgb A1c: No results for input(s): HGBA1C in the last 72 hours. Lipid Profile: No results for input(s): CHOL, HDL, LDLCALC, TRIG,  CHOLHDL, LDLDIRECT in the last 72 hours. Thyroid  function studies: No results for input(s): TSH, T4TOTAL, T3FREE, THYROIDAB in the last 72 hours.  Invalid input(s): FREET3 Anemia work up: No results for input(s): VITAMINB12, FOLATE, FERRITIN, TIBC, IRON, RETICCTPCT in the last 72 hours. Sepsis Labs: Recent Labs  Lab 12/04/23 1211 12/05/23 0516 12/06/23 0338  WBC 16.8* 16.6* 14.5*  LATICACIDVEN 1.6  --   --     Microbiology Recent Results (from the past 240 hours)  Aerobic/Anaerobic Culture w Gram Stain (surgical/deep wound)     Status: None (Preliminary result)   Collection Time: 12/04/23  7:54 PM   Specimen: Foot; Wound  Result Value Ref Range Status   Specimen Description   Final    FOOT Performed at [facility], [address] [place], [place] [address]    Special Requests   Final    Immunocompromised Performed at [facility], [address] [place], [place] [address]    Gram Stain   Final    FEW WBC PRESENT,BOTH PMN AND MONONUCLEAR FEW GRAM POSITIVE COCCI    Culture   Final    CULTURE REINCUBATED FOR BETTER GROWTH Performed at [facility], [address] [place], [place] [address]    Report Status PENDING  Incomplete  Blood culture (routine x 2)     Status: None (Preliminary result)   Collection Time: 12/04/23  8:06 PM   Specimen: BLOOD  Result Value Ref Range Status   Specimen Description BLOOD BLOOD RIGHT FOREARM  Final   Special Requests   Final    BOTTLES DRAWN AEROBIC AND ANAEROBIC Blood Culture results may not be optimal due to an inadequate volume of blood  received in culture bottles   Culture   Final    NO GROWTH 2 DAYS Performed at [facility], [address] [place], [place] [address]    Report Status PENDING  Incomplete  Blood culture (routine x 2)     Status: None (Preliminary result)   Collection Time: 12/04/23  8:07 PM   Specimen: BLOOD  Result Value Ref Range Status   Specimen Description BLOOD BLOOD LEFT FOREARM  Final   Special Requests   Final    BOTTLES DRAWN AEROBIC AND ANAEROBIC Blood Culture results may not be optimal due to an inadequate volume of blood received in culture bottles   Culture   Final    NO GROWTH 2 DAYS Performed at [facility], [address], [place], [place] [address]    Report Status PENDING  Incomplete    Procedures and diagnostic studies:  CT FOOT LEFT W WO CONTRAST Result Date: 12/04/2023 CLINICAL DATA:  Provided history: Osteomyelitis suspected, foot, xray done EXAM: CT OF THE LOWER LEFT EXTREMITY WITHOUT CONTRAST TECHNIQUE: Multidetector CT imaging of the lower left extremity was performed following the standard protocol before and during bolus administration of intravenous contrast. RADIATION DOSE REDUCTION: This exam was performed according to the departmental dose-optimization program which includes automated exposure control, adjustment of the mA and/or kV according to patient size and/or use of iterative reconstruction technique. CONTRAST:  OMNIPAQUE  IOHEXOL  300 MG/ML  SOLN COMPARISON:  Radiograph earlier today FINDINGS: Patient with limited mobility, there is significant streak artifact from right knee replacement that is unable to be removed from the field of view. This significantly limits assessment. Bones/Joint/Cartilage The bones are diffusely under mineralized. Previous transmetatarsal amputation of the first ray. The fifth ray has been resected. There are destructive changes in the  second toe distal and middle phalanx with air in the distal phalanx and adjacent  soft tissues. The distal phalanx is exposed to air. No other obvious bony destructive changes. No evidence of acute fracture. Pes planus is better demonstrated on radiograph. Ligaments Suboptimally assessed by CT. Muscles and Tendons Fatty atrophy of the intrinsic musculature of the foot. Soft tissues There is air in the soft tissues adjacent to the distal second digit, the distal digit is exposed to air. Circumferential soft tissue thickening of the toe. Fluid collection in the plantar foot subjacent to and distal to the resected first metatarsal measures approximately 2.7 x 1.5 x 1.8 cm, series 18, image 71. Soft tissue thickening extends distally with fluid surrounding the second toe into the plantar soft tissues, sterility indeterminate, series 18, image 54. Diffuse forefoot soft tissue thickening. Prominent vascular calcifications. IMPRESSION: 1. Osteomyelitis of the second toe distal and middle phalanx with air in the distal phalanx and adjacent soft tissues. The distal phalanx is exposed to air. 2. Fluid collection in the plantar foot subjacent to and distal to the resected first metatarsal measures approximately 2.7 x 1.5 x 1.8 cm, suspicious for abscess. Soft tissue thickening extends distally with fluid surrounding the second toe into the plantar soft tissues, sterility indeterminate. 3. Previous transmetatarsal amputation of the first ray. The fifth ray has been resected. 4. Osteoporosis as well as streak artifact limits detailed CT assessment. Electronically Signed   By: Andrea Gasman M.D.   On: 12/04/2023 17:29   DG Foot Complete Left Result Date: 12/04/2023 EXAM: 3 or more VIEW(S) XRAY OF THE LEFT FOOT 12/04/2023 12:36:00 PM COMPARISON: 11/10/2023 CLINICAL HISTORY: infection. Pt was sent over from the wound center for wound check. Pt was there for a routine leg check and staff at wound care sent pt over because they are worried about gangrene and an abscess. Pt has dementia at baseline and hx of  diabetes FINDINGS: BONES AND JOINTS: New erosion of the distal aspect of the second toe middle phalanx concerning for osteomyelitis. Erosion of the proximal aspect of the second toe distal phalanx. ossible erosive changes are also noted involving the istal aspect of the nd roximal phalanx. Prior transmetatarsal amputation of the first digit at the mid metatarsal. Status post previous resection of the 5th ray. . No joint dislocation. SOFT TISSUES: Extensive vascular calcification. Diffuse soft tissue swelling of the forefoot. IMPRESSION: 1. Findings concerning for osteomyelitis involving the second toe at the level of the proximal distal phalanx distal middle phalanx and distal aspect of the proximal phalanx. 2. Diffuse soft tissue edema and extensive vascular calcifications. 3. Diffuse osteopenia. Electronically signed by: Waddell Calk MD 12/04/2023 01:10 PM EDT RP Workstation: [id number]               LOS: 2 days   AIDA CHO  [facility]   Pager on [email or web address]. If 7PM-7AM, please contact night-coverage at [email or web address]     12/06/2023, 10:38 AM

## 2023-12-07 DIAGNOSIS — I96 Gangrene, not elsewhere classified: Secondary | ICD-10-CM | POA: Diagnosis not present

## 2023-12-07 DIAGNOSIS — M86172 Other acute osteomyelitis, left ankle and foot: Secondary | ICD-10-CM | POA: Diagnosis not present

## 2023-12-07 LAB — GLUCOSE, CAPILLARY
Glucose-Capillary: 146 mg/dL — ABNORMAL HIGH (ref 70–99)
Glucose-Capillary: 109 mg/dL — ABNORMAL HIGH (ref 70–99)
Glucose-Capillary: 227 mg/dL — ABNORMAL HIGH (ref 70–99)
Glucose-Capillary: 227 mg/dL — ABNORMAL HIGH (ref 70–99)

## 2023-12-07 MED ORDER — ENSURE PLUS HIGH PROTEIN PO LIQD
237.0000 mL | Freq: Two times a day (BID) | ORAL | Status: DC
  Administered 2023-12-07 – 2023-12-12 (×9): 237 mL via ORAL

## 2023-12-07 MED ORDER — ADULT MULTIVITAMIN W/MINERALS CH
1.0000 | ORAL_TABLET | Freq: Every day | ORAL | Status: DC
  Administered 2023-12-07 – 2023-12-12 (×6): 1 via ORAL
  Filled 2023-12-07 (×6): qty 1

## 2023-12-07 MED ORDER — PROSOURCE PLUS PO LIQD
30.0000 mL | Freq: Three times a day (TID) | ORAL | Status: DC
  Administered 2023-12-07 – 2023-12-12 (×13): 30 mL via ORAL

## 2023-12-07 MED ORDER — VITAMIN C 500 MG PO TABS
500.0000 mg | ORAL_TABLET | Freq: Two times a day (BID) | ORAL | Status: DC
  Administered 2023-12-07 – 2023-12-12 (×12): 500 mg via ORAL
  Filled 2023-12-07 (×9): qty 1

## 2023-12-07 MED ORDER — ZINC SULFATE 220 (50 ZN) MG PO CAPS
220.0000 mg | ORAL_CAPSULE | Freq: Every day | ORAL | Status: DC
  Administered 2023-12-07 – 2023-12-12 (×6): 220 mg via ORAL
  Filled 2023-12-07 (×6): qty 1

## 2023-12-07 NOTE — Progress Notes (Signed)
 Initial Nutrition Assessment  DOCUMENTATION CODES:   Severe malnutrition in context of chronic illness  INTERVENTION:   -Feeding assistance with meals -Ensure Plus High Protein po BID, each supplement provides 350 kcal and 20 grams of protein  -30 ml Prosource Plus TID, each supplement provides 100 kcals and 15 grams protein -MVI with minerals daily -500 mg vitamin C  BID -220 mg zinc sulfate daily x 14 days -Magic cup TID with meals, each supplement provides 290 kcal and 9 grams of protein  -Liberalize diet to regular for widest variety of meal selections -RD will draw labs to assess for potential micronutrient deficiencies which may impede wound healing: vitamin A  NUTRITION DIAGNOSIS:   Severe Malnutrition related to chronic illness (PAD) as evidenced by percent weight loss, moderate fat depletion, severe fat depletion, moderate muscle depletion, severe muscle depletion.  GOAL:   Patient will meet greater than or equal to 90% of their needs  MONITOR:   PO intake, Supplement acceptance  REASON FOR ASSESSMENT:   Malnutrition Screening Tool    ASSESSMENT:   89 y.o. year old female with past medical history of hypertension, hyperlipidemia complicated by PAD, type 2 diabetes, history of foot wounds with complication of osteomyelitis presenting with left toe wound.  Patient admitted with acute osteomyelitis of left foot.   Reviewed I/O's: +379 ml x 24 hours and +729 ml since admission   Per [facility] notes, patient with neuropathic plantar ulcer to left heel and left second toe.   Spoke with Ms Mulhall today, who was pleasant and in good spirits today. She reports feeling better and shares with this RD that she is concerned about her non-healing wounds. She endorses being followed by the wound center, but has noticed her wounds worsening over the past few days.   Per patient, she does take some Prosource PTA (thick liquid gel in a cup that I have to lick). Per MAR, she  receives Prosource and Glucerna supplements. She shares her appetite comes and goes, but has not been able to eat much PTA, as she does not like the food at the facility (she is from [facility]). Noted she consumed a few bites of pancake this morning. Patient has poor dentition, but denies any difficulty chewing or swallowing.   Patient endorses weight loss, but unsure of UBW or how much weight she has lost. Reviewed weight history;patient has experienced a 20% weight loss over the past month, which is significant for time frame.   Discussed importance of good meal and supplement intake to promote healing. Patient amenable to supplements. Reviewed plan of care; she voices appreciation for visit.   Medications reviewed and include vitamin B-12.  Lab Results  Component Value Date   HGBA1C 5.9 (H) 11/07/2023   PTA DM medications are glucagon  and SSI insulin  aspart.   Labs reviewed: CBGS: 64-146 (inpatient orders for glycemic control are 0-5 units insulin  aspart daily at bedtime and 0-9 units insulin  aspart TID with meals).    NUTRITION - FOCUSED PHYSICAL EXAM:  Flowsheet Row Most Recent Value  Orbital Region Moderate depletion  Upper Arm Region Severe depletion  Thoracic and Lumbar Region Severe depletion  Buccal Region Moderate depletion  Temple Region Moderate depletion  Clavicle Bone Region Severe depletion  Clavicle and Acromion Bone Region Severe depletion  Scapular Bone Region Severe depletion  Dorsal Hand Severe depletion  Patellar Region Severe depletion  Anterior Thigh Region Severe depletion  Posterior Calf Region Severe depletion  Edema (RD Assessment) Mild  Hair Reviewed  Eyes Reviewed  Mouth Reviewed  Skin Reviewed  Nails Reviewed    Diet Order:   Diet Order             Diet NPO time specified Except for: Sips with Meds  Diet effective midnight           Diet regular Fluid consistency: Thin  Diet effective now                   EDUCATION NEEDS:    Education needs have been addressed  Skin:  Skin Assessment: Skin Integrity Issues: Skin Integrity Issues:: Diabetic Ulcer Diabetic Ulcer: left plantar foot, left second toe, left heel  Last BM:  12/05/23 (type 1)  Height:   Ht Readings from Last 1 Encounters:  11/06/23 5' 4 (1.626 m)    Weight:   Wt Readings from Last 1 Encounters:  12/04/23 54.9 kg    Ideal Body Weight:  54.5 kg  BMI:  Body mass index is 20.78 kg/m.  Estimated Nutritional Needs:   Kcal:  1650-1850  Protein:  85-100 grams  Fluid:  1.6-1.8 L    Margery ORN, RD, LDN, CDCES Registered Dietitian III Certified Diabetes Care and Education Specialist If unable to reach this RD, please use RD Inpatient group chat on secure chat between hours of 8am-4 pm daily

## 2023-12-07 NOTE — Progress Notes (Signed)
 Progress Note    [name]  FMW:[id number] DOB: [birth year]  DOA: 12/04/2023 PCP: Rudolpho Norleen BIRCH, MD      Brief Narrative:    Medical records reviewed and are as summarized below:  [name] is a 89 y.o. female with medical history significant for dementia, hypertension, hyperlipidemia, type II DM, PAD, CAD, stroke with left hemiparesis, history of left foot wounds, hospitalized from 11/06/2023 through 11/11/2023 for critical left lower extremity ischemia s/p angioplasty of left lower extremity on 11/06/2023.  She was referred from the wound care clinic to the ED for further evaluation of left foot wound because of concern for gangrene/abscess.    She was found to have osteomyelitis of the left foot.      Assessment/Plan:   Principal Problem:   Acute osteomyelitis of toe, left (HCC) Active Problems:   Type 2 diabetes mellitus with polyneuropathy (HCC)   Mixed hyperlipidemia   Essential hypertension   Atherosclerosis of native arteries of the extremities with ulceration (HCC)   Body mass index is 20.78 kg/m.  Left foot osteomyelitis, leukocytosis: Continue IV vancomycin  and Zosyn .  Analgesics as needed for pain.  Plan for left AKA on 12/09/2023. Follow-up with vascular surgeon.   Hypertension: Continue lisinopril    Mild hypoglycemia: Improved.  Subsequent glucose levels have been optimal.  Encourage adequate oral intake.    CAD, PVD s/p left lower extremity angioplasty on 11/06/2023, history of stroke: Continue aspirin  and Lipitor    History of stroke with left hemiparesis.  Patient is nonambulatory at baseline.   Comorbidities include hyperlipidemia, dementia, chronic anemia     Diet Order             Diet Heart Room service appropriate? Yes; Fluid consistency: Thin  Diet effective now                                  Consultants: Podiatrist Vascular surgeon  Procedures: None    Medications:    aspirin  EC   81 mg Oral Daily   atorvastatin   80 mg Oral Daily   cyanocobalamin   1,000 mcg Oral Daily   heparin   5,000 Units Subcutaneous Q8H   hydrocerin   Topical BID   insulin  aspart  0-5 Units Subcutaneous QHS   insulin  aspart  0-9 Units Subcutaneous TID WC   lisinopril   20 mg Oral Daily   Continuous Infusions:  piperacillin -tazobactam (ZOSYN )  IV 3.375 g (12/07/23 0519)   vancomycin  750 mg (12/06/23 1256)     Anti-infectives (From admission, onward)    Start     Dose/Rate Route Frequency Ordered Stop   12/05/23 1300  vancomycin  (VANCOREADY) IVPB 750 mg/150 mL        750 mg 150 mL/hr over 60 Minutes Intravenous Every 24 hours 12/04/23 2108     12/04/23 2100  piperacillin -tazobactam (ZOSYN ) IVPB 3.375 g        3.375 g 12.5 mL/hr over 240 Minutes Intravenous Every 8 hours 12/04/23 2028     12/04/23 1200  piperacillin -tazobactam (ZOSYN ) IVPB 3.375 g        3.375 g 100 mL/hr over 30 Minutes Intravenous  Once 12/04/23 1148 12/04/23 1255   12/04/23 1200  vancomycin  (VANCOREADY) IVPB 1500 mg/300 mL        1,500 mg 150 mL/hr over 120 Minutes Intravenous  Once 12/04/23 1151 12/04/23 1504  Family Communication/Anticipated D/C date and plan/Code Status   DVT prophylaxis: heparin  injection 5,000 Units Start: 12/04/23 2200 SCDs Start: 12/04/23 1403     Code Status: Limited: Do not attempt resuscitation (DNR) -DNR-LIMITED -Do Not Intubate/DNI   Family Communication: None Disposition Plan: Plan to discharge to SNF   Status is: Inpatient Remains inpatient appropriate because: Left foot osteomyelitis       Subjective:   Interval events noted.  She complains of pain in the left foot.  No other complaints.  Objective:    Vitals:   12/06/23 1709 12/06/23 1938 12/07/23 0422 12/07/23 0842  BP: 97/79 (!) 150/74 (!) 159/60 (!) 150/60  Pulse: 72 67 73 69  Resp: 16 16  17   Temp: 97.9 F (36.6 C) 97.8 F (36.6 C) 98.4 F (36.9 C) 98.5 F (36.9 C)  TempSrc:     Oral  SpO2:   100% 98%  Weight:       No data found.   Intake/Output Summary (Last 24 hours) at 12/07/2023 1005 Last data filed at 12/07/2023 9480 Gross per 24 hour  Intake 259.2 ml  Output --  Net 259.2 ml   Filed Weights   12/04/23 2101  Weight: 54.9 kg    Exam:  GEN: NAD SKIN: Warm and dry EYES: No pallor or icterus ENT: MMM CV: RRR PULM: CTA B ABD: soft, ND, NT, +BS CNS: AAO x 1 (person), chronic left hemiparesis with contractures. EXT: Chronic left foot wounds.  Tenderness of left foot.    Data Reviewed:   I have personally reviewed following labs and imaging studies:  Labs: Labs show the following:   Basic Metabolic Panel: Recent Labs  Lab 12/04/23 1211 12/05/23 0516 12/06/23 0338  NA 143 143 142  K 4.1 3.4* 4.0  CL 102 103 108  CO2 27 25 23   GLUCOSE 192* 117* 69*  BUN 19 19 18   CREATININE 0.66 0.56 0.55  CALCIUM  9.1 8.5* 8.1*  MG  --   --  2.1  PHOS  --   --  3.4   GFR Estimated Creatinine Clearance: 42.8 mL/min (by C-G formula based on SCr of 0.55 mg/dL). Liver Function Tests: Recent Labs  Lab 12/04/23 1211 12/06/23 0338  AST 21  --   ALT 10  --   ALKPHOS 85  --   BILITOT 0.7  --   PROT 8.6*  --   ALBUMIN 2.9* 2.1*   No results for input(s): LIPASE, AMYLASE in the last 168 hours. No results for input(s): AMMONIA in the last 168 hours. Coagulation profile No results for input(s): INR, PROTIME in the last 168 hours.  CBC: Recent Labs  Lab 12/04/23 1211 12/05/23 0516 12/06/23 0338  WBC 16.8* 16.6* 14.5*  NEUTROABS 14.1*  --   --   HGB 10.0* 8.3* 7.8*  HCT 33.6* 27.0* 26.3*  MCV 94.6 92.2 95.3  PLT 484* 434* 388   Cardiac Enzymes: No results for input(s): CKTOTAL, CKMB, CKMBINDEX, TROPONINI in the last 168 hours. BNP (last 3 results) No results for input(s): PROBNP in the last 8760 hours. CBG: Recent Labs  Lab 12/06/23 0930 12/06/23 1237 12/06/23 1715 12/06/23 2001 12/07/23 0841  GLUCAP 93  141* 109* 103* 146*   D-Dimer: No results for input(s): DDIMER in the last 72 hours. Hgb A1c: No results for input(s): HGBA1C in the last 72 hours. Lipid Profile: No results for input(s): CHOL, HDL, LDLCALC, TRIG, CHOLHDL, LDLDIRECT in the last 72 hours. Thyroid  function studies: No results for input(s): TSH,  T4TOTAL, T3FREE, THYROIDAB in the last 72 hours.  Invalid input(s): FREET3 Anemia work up: No results for input(s): VITAMINB12, FOLATE, FERRITIN, TIBC, IRON, RETICCTPCT in the last 72 hours. Sepsis Labs: Recent Labs  Lab 12/04/23 1211 12/05/23 0516 12/06/23 0338  WBC 16.8* 16.6* 14.5*  LATICACIDVEN 1.6  --   --     Microbiology Recent Results (from the past 240 hours)  Aerobic/Anaerobic Culture w Gram Stain (surgical/deep wound)     Status: None (Preliminary result)   Collection Time: 12/04/23  7:54 PM   Specimen: Foot; Wound  Result Value Ref Range Status   Specimen Description   Final    FOOT Performed at [facility], [address], [place], [place] [address]    Special Requests   Final    Immunocompromised Performed at [facility], [address] [place], [place] [address]    Gram Stain   Final    FEW WBC PRESENT,BOTH PMN AND MONONUCLEAR FEW GRAM POSITIVE COCCI Performed at [facility], [address] [place], [place] [address]    Culture   Final    MODERATE METHICILLIN RESISTANT STAPHYLOCOCCUS AUREUS   Report Status PENDING  Incomplete   Organism ID, Bacteria METHICILLIN RESISTANT STAPHYLOCOCCUS AUREUS  Final      Susceptibility   Methicillin resistant staphylococcus aureus - MIC*    CIPROFLOXACIN >=8 RESISTANT Resistant     ERYTHROMYCIN >=8 RESISTANT Resistant     GENTAMICIN <=0.5 SENSITIVE Sensitive     OXACILLIN >=4 RESISTANT Resistant     TETRACYCLINE <=1 SENSITIVE Sensitive     VANCOMYCIN  1 SENSITIVE Sensitive     TRIMETH/SULFA <=10 SENSITIVE Sensitive     CLINDAMYCIN <=0.25  SENSITIVE Sensitive     RIFAMPIN <=0.5 SENSITIVE Sensitive     Inducible Clindamycin NEGATIVE Sensitive     LINEZOLID 2 SENSITIVE Sensitive     * MODERATE METHICILLIN RESISTANT STAPHYLOCOCCUS AUREUS  Blood culture (routine x 2)     Status: None (Preliminary result)   Collection Time: 12/04/23  8:06 PM   Specimen: BLOOD  Result Value Ref Range Status   Specimen Description BLOOD BLOOD RIGHT FOREARM  Final   Special Requests   Final    BOTTLES DRAWN AEROBIC AND ANAEROBIC Blood Culture results may not be optimal due to an inadequate volume of blood received in culture bottles   Culture   Final    NO GROWTH 3 DAYS Performed at [facility], [address], [place], [place] [address]    Report Status PENDING  Incomplete  Blood culture (routine x 2)     Status: None (Preliminary result)   Collection Time: 12/04/23  8:07 PM   Specimen: BLOOD  Result Value Ref Range Status   Specimen Description BLOOD BLOOD LEFT FOREARM  Final   Special Requests   Final    BOTTLES DRAWN AEROBIC AND ANAEROBIC Blood Culture results may not be optimal due to an inadequate volume of blood received in culture bottles   Culture   Final    NO GROWTH 3 DAYS Performed at [facility], [address] [place], [place] [address]    Report Status PENDING  Incomplete    Procedures and diagnostic studies:  No results found.              LOS: 3 days   Yvonne Stopher  [facility]   Pager on [email or web address]. If 7PM-7AM, please contact night-coverage at [email or web address]     12/07/2023, 10:05 AM

## 2023-12-07 NOTE — Progress Notes (Signed)
 Progress Note    12/07/2023 10:16 AM * No surgery found *  Subjective:  [name] is an 89 yo female well known to vascular surgery. She has a past medical history of hypertension, hyperlipidemia, diabetes, CVA with left hemiparesis, dementia who comes in with concerns for infection of her toes. Reviewed the hospital note where patient was admitted on 9/26 and had recanalization of the left lower extremity there was no concern for osteo at the time and so patient was discharged. Upon patient underwent a CT scan of her left lower extremity.  There is noted osteomyelitis of the second distal toe middle phalanx and air in the distal phalanx adjacent to the soft tissues.  There is also fluid collection of the plantar foot subadjacent to the distal first metatarsal space suspicious for abscess.  There is also noted previous transmetatarsal amputation of the first ray and fifth ray.  There was also osteoporosis as well as streak artifact which limits to complete CT diagnostic assessment.  Due to the patient's current condition of dementia as well as being nonambulatory with elevated white count and osteomyelitis of her foot,.  Vascular surgery recommends a left lower extremity above-the-knee amputation at this time.   Vitals:   12/07/23 0422 12/07/23 0842  BP: (!) 159/60 (!) 150/60  Pulse: 73 69  Resp:  17  Temp: 98.4 F (36.9 C) 98.5 F (36.9 C)  SpO2: 100% 98%   Physical Exam: Cardiac:  RRR, normal S1 and S2.  No murmurs appreciated. Lungs: Lungs clear throughout on auscultation, nonlabored breathing, no rales rhonchi or wheezing noted but diminished in the bases. Incisions: None Extremities: Prior first toe and fifth toe amputation to her left lower extremity.  Left third toe osteomyelitis with open wound draining.  Left heel ulcer. Abdomen: Positive bowel sounds throughout, soft, nontender nondistended. Neurologic: Severe dementia.  Unable to answer questions or follow  commands.  CBC    Component Value Date/Time   WBC 14.5 (H) 12/06/2023 0338   RBC 2.76 (L) 12/06/2023 0338   HGB 7.8 (L) 12/06/2023 0338   HGB 8.6 (L) 09/25/2013 0410   HCT 26.3 (L) 12/06/2023 0338   HCT 27.0 (L) 09/24/2013 0409   PLT 388 12/06/2023 0338   PLT 192 09/24/2013 0409   MCV 95.3 12/06/2023 0338   MCV 96 09/24/2013 0409   MCH 28.3 12/06/2023 0338   MCHC 29.7 (L) 12/06/2023 0338   RDW 16.0 (H) 12/06/2023 0338   RDW 13.8 09/24/2013 0409   LYMPHSABS 1.7 12/04/2023 1211   LYMPHSABS 2.5 09/24/2013 0409   MONOABS 0.7 12/04/2023 1211   MONOABS 0.7 09/24/2013 0409   EOSABS 0.2 12/04/2023 1211   EOSABS 0.4 09/24/2013 0409   BASOSABS 0.0 12/04/2023 1211   BASOSABS 0.0 09/24/2013 0409    BMET    Component Value Date/Time   NA 142 12/06/2023 0338   NA 143 09/24/2013 0409   K 4.0 12/06/2023 0338   K 3.6 09/24/2013 0409   CL 108 12/06/2023 0338   CL 108 (H) 09/24/2013 0409   CO2 23 12/06/2023 0338   CO2 27 09/24/2013 0409   GLUCOSE 69 (L) 12/06/2023 0338   GLUCOSE 222 (H) 09/24/2013 0409   BUN 18 12/06/2023 0338   BUN 7 09/24/2013 0409   CREATININE 0.55 12/06/2023 0338   CREATININE 0.77 09/24/2013 0409   CALCIUM  8.1 (L) 12/06/2023 0338   CALCIUM  7.7 (L) 09/24/2013 0409   GFRNONAA >60 12/06/2023 0338   GFRNONAA >60 09/24/2013 0409   GFRAA >  60 09/26/2019 1952   GFRAA >60 09/24/2013 0409    INR    Component Value Date/Time   INR 1.1 11/06/2023 1126     Intake/Output Summary (Last 24 hours) at 12/07/2023 1016 Last data filed at 12/07/2023 0519 Gross per 24 hour  Intake 259.2 ml  Output --  Net 259.2 ml     Assessment/Plan:  89 y.o. female who presents to [facility] emergency department for left lower extremity open wounds and ulcerations.* No surgery found *   PLAN Vascular surgery recommends patient have a left above-the-knee amputation due to extensive vascular damage to her left lower extremity.  Patient is currently in the care of of [facility]  Department of Social Services.  They are making decisions for her medical care.  I have left a message and my phone number with Candace Goebel [phone]) to discuss her procedure and get consent.  I have also left a message and my phone number with [name] supervisor at [facility] of social services [phone]) to discuss her procedure and get consent.  I am awaiting someone to call me back at this time.  Currently I scheduled the patient for left above-the-knee amputation for Wednesday, 12/09/2023 to be completed by Dr. Selinda Gu MD if we get consent.  I discussed the case in detail with Dr. Selinda Gu MD and he agrees with the plan.  DVT prophylaxis: ASA 81 mg daily, heparin  5000 units subcu every 8 hours.   [name] [facility] 12/07/2023 10:16 AM

## 2023-12-07 NOTE — Care Management Important Message (Signed)
 Important Message  Patient Details  Name: [name] MRN: [id number] Date of Birth: Mar 05, 1933   Important Message Given:  Yes - Medicare IM     Kline Bulthuis W, CMA 12/07/2023, 2:42 PM

## 2023-12-07 NOTE — Plan of Care (Signed)

## 2023-12-07 NOTE — TOC Initial Note (Addendum)
 Transition of Care [facility]) - Initial/Assessment Note    Patient Details  Name: [name] MRN: [id number] Date of Birth: 20-May-1933  Transition of Care [facility]) CM/SW Contact:    Dalia GORMAN Fuse, RN Phone Number: 12/07/2023, 9:43 AM  Clinical Narrative:                 Patient presented with L foot osteomyelitis. Plan for L proximal amputation today. Patient is from LTC bed at [facility]. TOC spoke with Madelin Shoulder at the facility and they will accept her back.  TOC will cotinue to follow.  Expected Discharge Plan: Long Term Acute Care (LTAC) Barriers to Discharge: Continued Medical Work up   Patient Goals and CMS Choice            Expected Discharge Plan and Services       Living arrangements for the past 2 months: Skilled Nursing Facility                                      Prior Living Arrangements/Services Living arrangements for the past 2 months: Skilled Nursing Facility Lives with:: Facility Resident                   Activities of Daily Living   ADL Screening (condition at time of admission) Independently performs ADLs?: No Does the patient have a NEW difficulty with bathing/dressing/toileting/self-feeding that is expected to last >3 days?: Yes (Initiates electronic notice to provider for possible OT consult) Does the patient have a NEW difficulty with getting in/out of bed, walking, or climbing stairs that is expected to last >3 days?: Yes (Initiates electronic notice to provider for possible PT consult) Does the patient have a NEW difficulty with communication that is expected to last >3 days?: Yes (Initiates electronic notice to provider for possible SLP consult) Is the patient deaf or have difficulty hearing?: No Does the patient have difficulty seeing, even when wearing glasses/contacts?: No Does the patient have difficulty concentrating, remembering, or making decisions?: Yes  Permission Sought/Granted                   Emotional Assessment              Admission diagnosis:  Gangrene of toe (HCC) [I96] Acute osteomyelitis of toe, left (HCC) [M86.172] Osteomyelitis, unspecified site, unspecified type [facility]) [M86.9] Patient Active Problem List   Diagnosis Date Noted   Acute osteomyelitis of toe, left (HCC) 12/04/2023   Critical limb ischemia of left lower extremity (HCC) 11/06/2023   Ischemic foot ulcer due to atherosclerosis of native artery of limb (HCC) 11/06/2023   Protein-calorie malnutrition, severe 04/30/2023   Toe gangrene (HCC) 04/28/2023   Dementia (HCC) 04/09/2023   DJD (degenerative joint disease) 01/21/2023   Ischemia of left lower extremity 08/27/2022   Osteomyelitis of left foot (HCC) 08/27/2022   Gangrene of toe of left foot (HCC) 08/26/2022   Acute osteomyelitis of left foot (HCC) 08/08/2022   Diabetic foot infection (HCC) 08/07/2022   Hemiplegia affecting left side in left-dominant patient as late effect of cerebrovascular disease (HCC) 01/10/2022   Vaginal candidiasis 01/10/2022   Leg wound, left 01/08/2022   Hypoglycemia 01/07/2022   Leukocytosis 01/07/2022   MRI contraindicated due to metal implant 01/07/2022   Open wound of left foot 01/07/2022   Atherosclerosis of native arteries of the extremities with ulceration (HCC) 06/06/2021   Pressure injury of  skin 06/01/2021   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   PAD (peripheral artery disease) 05/17/2021   Right hemiparesis (HCC) 05/16/2021   Cellulitis of left foot 05/15/2021   Hypokalemia 05/15/2021   History of stroke 10/07/2019   Altered mental status 10/06/2019   Carotid artery disease 09/28/2019   Left hemiparesis (HCC) 09/28/2019   Hemiplegia and hemiparesis following cerebral infarction affecting right dominant side (HCC) 09/27/2019   Primary osteoarthritis of both hips 11/06/2017   Microalbuminuria 11/02/2017   Cystocele, midline 09/04/2017   Incomplete uterine prolapse 09/04/2017   Ankle edema 04/27/2017    Malnutrition of moderate degree 01/15/2016   Dehydration    Incarcerated ventral hernia    Diabetic ketoacidosis without coma associated with type 2 diabetes mellitus (HCC)    Type 2 diabetes mellitus with polyneuropathy (HCC) 08/06/2015   Carpal tunnel syndrome, bilateral 07/10/2015   Osteopenia of multiple sites 07/10/2015   Arthralgia of multiple sites 06/27/2015   Mixed hyperlipidemia 11/06/2014   Hypomagnesemia 11/02/2013   Essential hypertension 11/02/2013   PCP:  Rudolpho Norleen BIRCH, MD Pharmacy:   [facility] [place], [facility] - [address] [place] [address] Phone: [phone] Fax: [phone]     Social Drivers of Health (SDOH) Social History: SDOH Screenings   Food Insecurity: Patient Unable To Answer (12/05/2023)  Housing: Unknown (12/05/2023)  Transportation Needs: Patient Unable To Answer (12/05/2023)  Utilities: Patient Unable To Answer (12/05/2023)  Social Connections: Patient Unable To Answer (12/05/2023)  Tobacco Use: Low Risk  (12/04/2023)   SDOH Interventions:     Readmission Risk Interventions    05/01/2023    4:26 PM  Readmission Risk Prevention Plan  Transportation Screening Complete  HRI or Home Care Consult Complete  Social Work Consult for Recovery Care Planning/Counseling Complete  Medication Review Oceanographer) Complete

## 2023-12-08 DIAGNOSIS — M86172 Other acute osteomyelitis, left ankle and foot: Secondary | ICD-10-CM | POA: Diagnosis not present

## 2023-12-08 DIAGNOSIS — I96 Gangrene, not elsewhere classified: Secondary | ICD-10-CM | POA: Diagnosis not present

## 2023-12-08 LAB — GLUCOSE, CAPILLARY
Glucose-Capillary: 163 mg/dL — ABNORMAL HIGH (ref 70–99)
Glucose-Capillary: 279 mg/dL — ABNORMAL HIGH (ref 70–99)
Glucose-Capillary: 207 mg/dL — ABNORMAL HIGH (ref 70–99)
Glucose-Capillary: 202 mg/dL — ABNORMAL HIGH (ref 70–99)

## 2023-12-08 NOTE — TOC Progression Note (Signed)
 Transition of Care [facility]) - Progression Note    Patient Details  Name: [name] MRN: [id number] Date of Birth: 1933-07-08  [facility]) CM/SW Contact  Dalia GORMAN Fuse, RN Phone Number: 12/08/2023, 3:42 PM  Clinical Narrative:    Vascular now recommending she have a left above-the-knee amputation due to extensive vascular damage to her left lower extremity. Plan for L AKA on 10/29.   Expected Discharge Plan: Long Term Acute Care (LTAC) Barriers to Discharge: Continued Medical Work up    DSS Service county: [facility]          Expected Discharge Plan and Services       Living arrangements for the past 2 months: Skilled Nursing Facility                                       Social Drivers of Health (SDOH) Interventions SDOH Screenings   Food Insecurity: Patient Unable To Answer (12/05/2023)  Housing: Unknown (12/05/2023)  Transportation Needs: Patient Unable To Answer (12/05/2023)  Utilities: Patient Unable To Answer (12/05/2023)  Social Connections: Patient Unable To Answer (12/05/2023)  Tobacco Use: Low Risk  (12/04/2023)    Readmission Risk Interventions    05/01/2023    4:26 PM  Readmission Risk Prevention Plan  Transportation Screening Complete  HRI or Home Care Consult Complete  Social Work Consult for Recovery Care Planning/Counseling Complete  Medication Review Oceanographer) Complete

## 2023-12-08 NOTE — Plan of Care (Signed)
  Problem: Education: Goal: Ability to describe self-care measures that may prevent or decrease complications (Diabetes Survival Skills Education) will improve Outcome: Progressing Goal: Individualized Educational Video(s) Outcome: Progressing   Problem: Coping: Goal: Ability to adjust to condition or change in health will improve Outcome: Progressing   Problem: Fluid Volume: Goal: Ability to maintain a balanced intake and output will improve Outcome: Progressing   Problem: Health Behavior/Discharge Planning: Goal: Ability to identify and utilize available resources and services will improve Outcome: Progressing Goal: Ability to manage health-related needs will improve Outcome: Progressing   Problem: Metabolic: Goal: Ability to maintain appropriate glucose levels will improve Outcome: Progressing   Problem: Nutritional: Goal: Maintenance of adequate nutrition will improve Outcome: Progressing Goal: Progress toward achieving an optimal weight will improve Outcome: Progressing   Problem: Skin Integrity: Goal: Risk for impaired skin integrity will decrease Outcome: Progressing   Problem: Tissue Perfusion: Goal: Adequacy of tissue perfusion will improve Outcome: Progressing   Problem: Education: Goal: Knowledge of General Education information will improve Description: Including pain rating scale, medication(s)/side effects and non-pharmacologic comfort measures Outcome: Progressing   Problem: Health Behavior/Discharge Planning: Goal: Ability to manage health-related needs will improve Outcome: Progressing   Problem: Clinical Measurements: Goal: Ability to maintain clinical measurements within normal limits will improve Outcome: Progressing Goal: Will remain free from infection Outcome: Progressing Goal: Diagnostic test results will improve Outcome: Progressing Goal: Respiratory complications will improve Outcome: Progressing Goal: Cardiovascular complication will  be avoided Outcome: Progressing   Problem: Activity: Goal: Risk for activity intolerance will decrease Outcome: Progressing   Problem: Nutrition: Goal: Adequate nutrition will be maintained Outcome: Progressing   Problem: Elimination: Goal: Will not experience complications related to bowel motility Outcome: Progressing Goal: Will not experience complications related to urinary retention Outcome: Progressing   Problem: Pain Managment: Goal: General experience of comfort will improve and/or be controlled Outcome: Progressing

## 2023-12-08 NOTE — Progress Notes (Signed)
 Progress Note    12/08/2023 12:15 PM * No surgery found *  Subjective:  Jaimi Fusaro is an 89 yo female who is well-known to vein and vascular services.  Back on 11/06/2023 patient underwent the following procedure.  Procedure(s) Performed:             1.  Introduction catheter into left lower extremity 3rd order catheter placement               2.    Contrast injection left lower extremity for distal runoff             3.  Percutaneous transluminal angioplasty and stent placement left popliteal artery to 4 mm             4.  Percutaneous transluminal angioplasty and stent placement to the peroneal artery to 3.75 mm             5.  Mechanical thrombectomy of the popliteal artery occlusion and in-stent restenosis with the penumbra CAT 6 bolt catheter.               6.  Celt closure right common femoral arteriotomy  Post this angiogram procedure patient underwent transmetatarsal amputation of the first ray of the fifth ray.  Since that time her left lower extremity has deteriorated.  Patient presented to [facility] emergency department on 12/04/2023 with a left toe wound.  At that time she had increased white blood cell count of 16,000's and x-rays were concerning for osteomyelitis.  She was seen by Dr. Gaile New in consult regarding her left lower extremity.  She was seen and pictures were placed in the media files of her left lower extremity.  She has chronic wounds that she is nonambulatory.  Patient also suffers from dementia and has chronic contracture of her left leg.  On that consultation social services who is the guardian for this patient and the family are electing to proceed with an above-the-knee amputation.   Vitals:   12/08/23 0403 12/08/23 0752  BP: (!) 147/64 (!) 147/70  Pulse: 77 70  Resp:  16  Temp: (!) 97.3 F (36.3 C) 98.5 F (36.9 C)  SpO2: 97% 100%   Physical Exam: Cardiac:  RRR, normal S1 and S2.  No murmurs appreciated. Lungs: Lungs clear throughout on  auscultation, nonlabored breathing, no rales rhonchi or wheezing noted but diminished in the bases. Incisions: None Extremities: Prior first toe and fifth toe amputation to her left lower extremity.  Left third toe osteomyelitis with open wound draining.  Left heel ulcer. Abdomen: Positive bowel sounds throughout, soft, nontender nondistended. Neurologic: Severe dementia.  Unable to answer questions or follow commands.    CBC    Component Value Date/Time   WBC 14.5 (H) 12/06/2023 0338   RBC 2.76 (L) 12/06/2023 0338   HGB 7.8 (L) 12/06/2023 0338   HGB 8.6 (L) 09/25/2013 0410   HCT 26.3 (L) 12/06/2023 0338   HCT 27.0 (L) 09/24/2013 0409   PLT 388 12/06/2023 0338   PLT 192 09/24/2013 0409   MCV 95.3 12/06/2023 0338   MCV 96 09/24/2013 0409   MCH 28.3 12/06/2023 0338   MCHC 29.7 (L) 12/06/2023 0338   RDW 16.0 (H) 12/06/2023 0338   RDW 13.8 09/24/2013 0409   LYMPHSABS 1.7 12/04/2023 1211   LYMPHSABS 2.5 09/24/2013 0409   MONOABS 0.7 12/04/2023 1211   MONOABS 0.7 09/24/2013 0409   EOSABS 0.2 12/04/2023 1211   EOSABS 0.4 09/24/2013 0409   BASOSABS 0.0  12/04/2023 1211   BASOSABS 0.0 09/24/2013 0409    BMET    Component Value Date/Time   NA 142 12/06/2023 0338   NA 143 09/24/2013 0409   K 4.0 12/06/2023 0338   K 3.6 09/24/2013 0409   CL 108 12/06/2023 0338   CL 108 (H) 09/24/2013 0409   CO2 23 12/06/2023 0338   CO2 27 09/24/2013 0409   GLUCOSE 69 (L) 12/06/2023 0338   GLUCOSE 222 (H) 09/24/2013 0409   BUN 18 12/06/2023 0338   BUN 7 09/24/2013 0409   CREATININE 0.55 12/06/2023 0338   CREATININE 0.77 09/24/2013 0409   CALCIUM  8.1 (L) 12/06/2023 0338   CALCIUM  7.7 (L) 09/24/2013 0409   GFRNONAA >60 12/06/2023 0338   GFRNONAA >60 09/24/2013 0409   GFRAA >60 09/26/2019 1952   GFRAA >60 09/24/2013 0409    INR    Component Value Date/Time   INR 1.1 11/06/2023 1126     Intake/Output Summary (Last 24 hours) at 12/08/2023 1215 Last data filed at 12/08/2023 9074 Gross  per 24 hour  Intake 1135.87 ml  Output --  Net 1135.87 ml     Assessment/Plan:  89 y.o. female who presents to [facility] emergency department for left lower extremity open wounds and ulcerations  * No surgery found *   PLAN Vascular surgery recommends patient have a left above-the-knee amputation due to extensive vascular damage to her left lower extremity.  Patient is currently in the care of of [facility] Department of Social Services.  They are making decisions for her medical care.  I have spoke with both Candace gobble and [name] from the Department of social services of [place].  They both have given consent for the patient to have a left above-the-knee amputation as soon as possible.  Currently I scheduled the patient for left above-the-knee amputation for Wednesday, 12/09/2023 to be completed by Dr. Selinda Gu MD now that we have received consent.  I did speak with the patient's son who is sitting at the bedside today with her.  He was alerted by DSS of the patient's condition and that she will be going for a left lower extremity above-the-knee amputation tomorrow.  He verbalizes understanding and also wishes to proceed for his mother.   I discussed the case in detail with Dr. Selinda Gu MD and he agrees with the plan.   DVT prophylaxis: ASA 81 mg daily, heparin  5000 units subcu every 8 hours.  These will be held prior to surgery.   [name] [facility] 12/08/2023 12:15 PM

## 2023-12-08 NOTE — Progress Notes (Signed)
 Progress Note    [name]  FMW:[id number] DOB: 08-Mar-1933  DOA: 12/04/2023 PCP: Rudolpho Norleen BIRCH, MD      Brief Narrative:    Medical records reviewed and are as summarized below:  [name] is a 89 y.o. female with medical history significant for dementia, hypertension, hyperlipidemia, type II DM, PAD, CAD, stroke with left hemiparesis, history of left foot wounds, hospitalized from 11/06/2023 through 11/11/2023 for critical left lower extremity ischemia s/p angioplasty of left lower extremity on 11/06/2023.  She was referred from the wound care clinic to the ED for further evaluation of left foot wound because of concern for gangrene/abscess.    She was found to have osteomyelitis of the left foot.      Assessment/Plan:   Principal Problem:   Acute osteomyelitis of toe, left (HCC) Active Problems:   Type 2 diabetes mellitus with polyneuropathy (HCC)   Mixed hyperlipidemia   Essential hypertension   Atherosclerosis of native arteries of the extremities with ulceration (HCC)   Gangrene of toe (HCC)   Body mass index is 20.78 kg/m.  Left foot osteomyelitis, leukocytosis: Continue IV Zosyn  and vancomycin .  Analgesics as needed for pain.  Plan for left AKA tomorrow.  Follow-up with vascular surgeon.    Hypertension: Continue lisinopril    Mild hypoglycemia: Improved.   Type II DM with hyperglycemia: NovoLog  as needed for hyperglycemia.  Recent hemoglobin A1c on 11/07/2023 was 5.9. Hemoglobin A1c from 08/08/2022 was 8.1.     CAD, PVD s/p left lower extremity angioplasty on 11/06/2023, history of stroke: Continue aspirin  and Lipitor    History of stroke with left hemiparesis.  Patient is nonambulatory at baseline.   Comorbidities include hyperlipidemia, dementia, chronic anemia     Diet Order             Diet Heart Room service appropriate? Yes; Fluid consistency: Thin  Diet effective now                                   Consultants: Podiatrist Vascular surgeon  Procedures: None    Medications:    (feeding supplement) PROSource Plus  30 mL Oral TID BM   vitamin C   500 mg Oral BID   aspirin  EC  81 mg Oral Daily   atorvastatin   80 mg Oral Daily   cyanocobalamin   1,000 mcg Oral Daily   feeding supplement  237 mL Oral BID BM   heparin   5,000 Units Subcutaneous Q8H   hydrocerin   Topical BID   insulin  aspart  0-5 Units Subcutaneous QHS   insulin  aspart  0-9 Units Subcutaneous TID WC   lisinopril   20 mg Oral Daily   multivitamin with minerals  1 tablet Oral Daily   zinc sulfate (50mg  elemental zinc)  220 mg Oral Daily   Continuous Infusions:  piperacillin -tazobactam (ZOSYN )  IV 3.375 g (12/08/23 1321)   vancomycin  750 mg (12/08/23 1321)     Anti-infectives (From admission, onward)    Start     Dose/Rate Route Frequency Ordered Stop   12/05/23 1300  vancomycin  (VANCOREADY) IVPB 750 mg/150 mL        750 mg 150 mL/hr over 60 Minutes Intravenous Every 24 hours 12/04/23 2108     12/04/23 2100  piperacillin -tazobactam (ZOSYN ) IVPB 3.375 g        3.375 g 12.5 mL/hr over 240 Minutes Intravenous Every 8 hours 12/04/23 2028  12/04/23 1200  piperacillin -tazobactam (ZOSYN ) IVPB 3.375 g        3.375 g 100 mL/hr over 30 Minutes Intravenous  Once 12/04/23 1148 12/04/23 1255   12/04/23 1200  vancomycin  (VANCOREADY) IVPB 1500 mg/300 mL        1,500 mg 150 mL/hr over 120 Minutes Intravenous  Once 12/04/23 1151 12/04/23 1504              Family Communication/Anticipated D/C date and plan/Code Status   DVT prophylaxis: heparin  injection 5,000 Units Start: 12/04/23 2200 SCDs Start: 12/04/23 1403     Code Status: Limited: Do not attempt resuscitation (DNR) -DNR-LIMITED -Do Not Intubate/DNI   Family Communication: None Disposition Plan: Plan to discharge to SNF   Status is: Inpatient Remains inpatient appropriate because: Left foot  osteomyelitis       Subjective:   Interval events noted.  Pain in the left foot is better today.  She complained that her breakfast was too much for her.  Objective:    Vitals:   12/07/23 1533 12/07/23 1929 12/08/23 0403 12/08/23 0752  BP: (!) 145/65 (!) 106/50 (!) 147/64 (!) 147/70  Pulse: 85 79 77 70  Resp: 18 15  16   Temp: 100 F (37.8 C) 98.9 F (37.2 C) (!) 97.3 F (36.3 C) 98.5 F (36.9 C)  TempSrc:  Oral Oral   SpO2: 99% 100% 97% 100%  Weight:      Height:       No data found.   Intake/Output Summary (Last 24 hours) at 12/08/2023 1416 Last data filed at 12/08/2023 0925 Gross per 24 hour  Intake 895.87 ml  Output --  Net 895.87 ml   Filed Weights   12/04/23 2101  Weight: 54.9 kg    Exam:  GEN: NAD SKIN: Warm and dry EYES: No pallor or icterus ENT: MMM CV: RRR PULM: CTA B ABD: soft, ND, NT, +BS CNS: AAO x 1, left hemiparesis EXT: Left foot wound dressing is clean, dry and intact     Data Reviewed:   I have personally reviewed following labs and imaging studies:  Labs: Labs show the following:   Basic Metabolic Panel: Recent Labs  Lab 12/04/23 1211 12/05/23 0516 12/06/23 0338  NA 143 143 142  K 4.1 3.4* 4.0  CL 102 103 108  CO2 27 25 23   GLUCOSE 192* 117* 69*  BUN 19 19 18   CREATININE 0.66 0.56 0.55  CALCIUM  9.1 8.5* 8.1*  MG  --   --  2.1  PHOS  --   --  3.4   GFR Estimated Creatinine Clearance: 42.8 mL/min (by C-G formula based on SCr of 0.55 mg/dL). Liver Function Tests: Recent Labs  Lab 12/04/23 1211 12/06/23 0338  AST 21  --   ALT 10  --   ALKPHOS 85  --   BILITOT 0.7  --   PROT 8.6*  --   ALBUMIN 2.9* 2.1*   No results for input(s): LIPASE, AMYLASE in the last 168 hours. No results for input(s): AMMONIA in the last 168 hours. Coagulation profile No results for input(s): INR, PROTIME in the last 168 hours.  CBC: Recent Labs  Lab 12/04/23 1211 12/05/23 0516 12/06/23 0338  WBC 16.8* 16.6*  14.5*  NEUTROABS 14.1*  --   --   HGB 10.0* 8.3* 7.8*  HCT 33.6* 27.0* 26.3*  MCV 94.6 92.2 95.3  PLT 484* 434* 388   Cardiac Enzymes: No results for input(s): CKTOTAL, CKMB, CKMBINDEX, TROPONINI in the last 168 hours.  BNP (last 3 results) No results for input(s): PROBNP in the last 8760 hours. CBG: Recent Labs  Lab 12/07/23 1135 12/07/23 1632 12/07/23 2016 12/08/23 0823 12/08/23 1143  GLUCAP 109* 227* 227* 163* 279*   D-Dimer: No results for input(s): DDIMER in the last 72 hours. Hgb A1c: No results for input(s): HGBA1C in the last 72 hours. Lipid Profile: No results for input(s): CHOL, HDL, LDLCALC, TRIG, CHOLHDL, LDLDIRECT in the last 72 hours. Thyroid  function studies: No results for input(s): TSH, T4TOTAL, T3FREE, THYROIDAB in the last 72 hours.  Invalid input(s): FREET3 Anemia work up: No results for input(s): VITAMINB12, FOLATE, FERRITIN, TIBC, IRON, RETICCTPCT in the last 72 hours. Sepsis Labs: Recent Labs  Lab 12/04/23 1211 12/05/23 0516 12/06/23 0338  WBC 16.8* 16.6* 14.5*  LATICACIDVEN 1.6  --   --     Microbiology Recent Results (from the past 240 hours)  Aerobic/Anaerobic Culture w Gram Stain (surgical/deep wound)     Status: None (Preliminary result)   Collection Time: 12/04/23  7:54 PM   Specimen: Foot; Wound  Result Value Ref Range Status   Specimen Description   Final    FOOT Performed at [facility], [address] [place], [place] [address]    Special Requests   Final    Immunocompromised Performed at [facility], [address] [place], [place] [address]    Gram Stain   Final    FEW WBC PRESENT,BOTH PMN AND MONONUCLEAR FEW GRAM POSITIVE COCCI Performed at [facility], [address] [place], [place] [address]    Culture   Final    MODERATE METHICILLIN RESISTANT STAPHYLOCOCCUS AUREUS NO ANAEROBES ISOLATED; CULTURE IN PROGRESS FOR 5 DAYS    Report Status  PENDING  Incomplete   Organism ID, Bacteria METHICILLIN RESISTANT STAPHYLOCOCCUS AUREUS  Final      Susceptibility   Methicillin resistant staphylococcus aureus - MIC*    CIPROFLOXACIN >=8 RESISTANT Resistant     ERYTHROMYCIN >=8 RESISTANT Resistant     GENTAMICIN <=0.5 SENSITIVE Sensitive     OXACILLIN >=4 RESISTANT Resistant     TETRACYCLINE <=1 SENSITIVE Sensitive     VANCOMYCIN  1 SENSITIVE Sensitive     TRIMETH/SULFA <=10 SENSITIVE Sensitive     CLINDAMYCIN <=0.25 SENSITIVE Sensitive     RIFAMPIN <=0.5 SENSITIVE Sensitive     Inducible Clindamycin NEGATIVE Sensitive     LINEZOLID 2 SENSITIVE Sensitive     * MODERATE METHICILLIN RESISTANT STAPHYLOCOCCUS AUREUS  Blood culture (routine x 2)     Status: None (Preliminary result)   Collection Time: 12/04/23  8:06 PM   Specimen: BLOOD  Result Value Ref Range Status   Specimen Description BLOOD BLOOD RIGHT FOREARM  Final   Special Requests   Final    BOTTLES DRAWN AEROBIC AND ANAEROBIC Blood Culture results may not be optimal due to an inadequate volume of blood received in culture bottles   Culture   Final    NO GROWTH 4 DAYS Performed at [facility], [address] [place], [place] [address]    Report Status PENDING  Incomplete  Blood culture (routine x 2)     Status: None (Preliminary result)   Collection Time: 12/04/23  8:07 PM   Specimen: BLOOD  Result Value Ref Range Status   Specimen Description BLOOD BLOOD LEFT FOREARM  Final   Special Requests   Final    BOTTLES DRAWN AEROBIC AND ANAEROBIC Blood Culture results may not be optimal due to an inadequate  volume of blood received in culture bottles   Culture   Final    NO GROWTH 4 DAYS Performed at [facility], [address] [place], [place] [address]    Report Status PENDING  Incomplete    Procedures and diagnostic studies:  No results found.              LOS: 4 days   Tieler Cournoyer  [facility]   Pager on [email or web address].  If 7PM-7AM, please contact night-coverage at [email or web address]     12/08/2023, 2:16 PM

## 2023-12-08 NOTE — H&P (View-Only) (Signed)
 Progress Note    12/08/2023 12:15 PM * No surgery found *  Subjective:  [name] is an 89 yo female who is well-known to vein and vascular services.  Back on 11/06/2023 patient underwent the following procedure.  Procedure(s) Performed:             1.  Introduction catheter into left lower extremity 3rd order catheter placement               2.    Contrast injection left lower extremity for distal runoff             3.  Percutaneous transluminal angioplasty and stent placement left popliteal artery to 4 mm             4.  Percutaneous transluminal angioplasty and stent placement to the peroneal artery to 3.75 mm             5.  Mechanical thrombectomy of the popliteal artery occlusion and in-stent restenosis with the penumbra CAT 6 bolt catheter.               6.  Celt closure right common femoral arteriotomy  Post this angiogram procedure patient underwent transmetatarsal amputation of the first ray of the fifth ray.  Since that time her left lower extremity has deteriorated.  Patient presented to [facility] emergency department on 12/04/2023 with a left toe wound.  At that time she had increased white blood cell count of 16,000's and x-rays were concerning for osteomyelitis.  She was seen by Dr. Gaile [name] in consult regarding her left lower extremity.  She was seen and pictures were placed in the media files of her left lower extremity.  She has chronic wounds that she is nonambulatory.  Patient also suffers from dementia and has chronic contracture of her left leg.  On that consultation social services who is the guardian for this patient and the family are electing to proceed with an above-the-knee amputation.   Vitals:   12/08/23 0403 12/08/23 0752  BP: (!) 147/64 (!) 147/70  Pulse: 77 70  Resp:  16  Temp: (!) 97.3 F (36.3 C) 98.5 F (36.9 C)  SpO2: 97% 100%   Physical Exam: Cardiac:  RRR, normal S1 and S2.  No murmurs appreciated. Lungs: Lungs clear throughout on  auscultation, nonlabored breathing, no rales rhonchi or wheezing noted but diminished in the bases. Incisions: None Extremities: Prior first toe and fifth toe amputation to her left lower extremity.  Left third toe osteomyelitis with open wound draining.  Left heel ulcer. Abdomen: Positive bowel sounds throughout, soft, nontender nondistended. Neurologic: Severe dementia.  Unable to answer questions or follow commands.    CBC    Component Value Date/Time   WBC 14.5 (H) 12/06/2023 0338   RBC 2.76 (L) 12/06/2023 0338   HGB 7.8 (L) 12/06/2023 0338   HGB 8.6 (L) 09/25/2013 0410   HCT 26.3 (L) 12/06/2023 0338   HCT 27.0 (L) 09/24/2013 0409   PLT 388 12/06/2023 0338   PLT 192 09/24/2013 0409   MCV 95.3 12/06/2023 0338   MCV 96 09/24/2013 0409   MCH 28.3 12/06/2023 0338   MCHC 29.7 (L) 12/06/2023 0338   RDW 16.0 (H) 12/06/2023 0338   RDW 13.8 09/24/2013 0409   LYMPHSABS 1.7 12/04/2023 1211   LYMPHSABS 2.5 09/24/2013 0409   MONOABS 0.7 12/04/2023 1211   MONOABS 0.7 09/24/2013 0409   EOSABS 0.2 12/04/2023 1211   EOSABS 0.4 09/24/2013 0409   BASOSABS 0.0  12/04/2023 1211   BASOSABS 0.0 09/24/2013 0409    BMET    Component Value Date/Time   NA 142 12/06/2023 0338   NA 143 09/24/2013 0409   K 4.0 12/06/2023 0338   K 3.6 09/24/2013 0409   CL 108 12/06/2023 0338   CL 108 (H) 09/24/2013 0409   CO2 23 12/06/2023 0338   CO2 27 09/24/2013 0409   GLUCOSE 69 (L) 12/06/2023 0338   GLUCOSE 222 (H) 09/24/2013 0409   BUN 18 12/06/2023 0338   BUN 7 09/24/2013 0409   CREATININE 0.55 12/06/2023 0338   CREATININE 0.77 09/24/2013 0409   CALCIUM  8.1 (L) 12/06/2023 0338   CALCIUM  7.7 (L) 09/24/2013 0409   GFRNONAA >60 12/06/2023 0338   GFRNONAA >60 09/24/2013 0409   GFRAA >60 09/26/2019 1952   GFRAA >60 09/24/2013 0409    INR    Component Value Date/Time   INR 1.1 11/06/2023 1126     Intake/Output Summary (Last 24 hours) at 12/08/2023 1215 Last data filed at 12/08/2023 9074 Gross  per 24 hour  Intake 1135.87 ml  Output --  Net 1135.87 ml     Assessment/Plan:  89 y.o. female who presents to [facility] emergency department for left lower extremity open wounds and ulcerations  * No surgery found *   PLAN Vascular surgery recommends patient have a left above-the-knee amputation due to extensive vascular damage to her left lower extremity.  Patient is currently in the care of of [facility] Department of Social Services.  They are making decisions for her medical care.  I have spoke with both [name] and [name] from the Department of social services of [place].  They both have given consent for the patient to have a left above-the-knee amputation as soon as possible.  Currently I scheduled the patient for left above-the-knee amputation for Wednesday, 12/09/2023 to be completed by Dr. Selinda Gu MD now that we have received consent.  I did speak with the patient's son who is sitting at the bedside today with her.  He was alerted by DSS of the patient's condition and that she will be going for a left lower extremity above-the-knee amputation tomorrow.  He verbalizes understanding and also wishes to proceed for his mother.   I discussed the case in detail with Dr. Selinda Gu MD and he agrees with the plan.   DVT prophylaxis: ASA 81 mg daily, heparin  5000 units subcu every 8 hours.  These will be held prior to surgery.   [name] [facility] 12/08/2023 12:15 PM

## 2023-12-09 ENCOUNTER — Encounter
Admission: EM | Disposition: A | Discharge status: Skilled Nursing Facility | Payer: Self-pay | Source: Home / Self Care | Attending: Internal Medicine

## 2023-12-09 ENCOUNTER — Inpatient Hospital Stay: Payer: Self-pay | Admitting: Anesthesiology

## 2023-12-09 ENCOUNTER — Encounter: Payer: Self-pay | Admitting: Internal Medicine

## 2023-12-09 DIAGNOSIS — M24562 Contracture, left knee: Secondary | ICD-10-CM

## 2023-12-09 DIAGNOSIS — M869 Osteomyelitis, unspecified: Secondary | ICD-10-CM | POA: Diagnosis not present

## 2023-12-09 HISTORY — PX: AMPUTATION: SHX166

## 2023-12-09 LAB — GLUCOSE, CAPILLARY
Glucose-Capillary: 151 mg/dL — ABNORMAL HIGH (ref 70–99)
Glucose-Capillary: 167 mg/dL — ABNORMAL HIGH (ref 70–99)
Glucose-Capillary: 206 mg/dL — ABNORMAL HIGH (ref 70–99)
Glucose-Capillary: 250 mg/dL — ABNORMAL HIGH (ref 70–99)
Glucose-Capillary: 285 mg/dL — ABNORMAL HIGH (ref 70–99)

## 2023-12-09 LAB — CULTURE, BLOOD (ROUTINE X 2)
Culture: NO GROWTH
Culture: NO GROWTH

## 2023-12-09 MED ORDER — FENTANYL CITRATE (PF) 100 MCG/2ML IJ SOLN
INTRAMUSCULAR | Status: AC
  Filled 2023-12-09: qty 2

## 2023-12-09 MED ORDER — ALBUTEROL SULFATE HFA 108 (90 BASE) MCG/ACT IN AERS
INHALATION_SPRAY | RESPIRATORY_TRACT | Status: AC
  Filled 2023-12-09: qty 6.7

## 2023-12-09 MED ORDER — LIDOCAINE HCL (PF) 2 % IJ SOLN
INTRAMUSCULAR | Status: AC
Start: 2023-12-09 — End: 2023-12-09
  Filled 2023-12-09: qty 5

## 2023-12-09 MED ORDER — OXYCODONE HCL 5 MG PO TABS: 5.0000 mg | ORAL_TABLET | Freq: Once | ORAL | Status: DC | PRN

## 2023-12-09 MED ORDER — VASHE WOUND IRRIGATION OPTIME
TOPICAL | Status: DC | PRN
  Administered 2023-12-09: 34 [oz_av] via TOPICAL

## 2023-12-09 MED ORDER — PROPOFOL 1000 MG/100ML IV EMUL
INTRAVENOUS | Status: AC
  Filled 2023-12-09: qty 100

## 2023-12-09 MED ORDER — PROPOFOL 10 MG/ML IV BOLUS
INTRAVENOUS | Status: AC
  Filled 2023-12-09: qty 20

## 2023-12-09 MED ORDER — FENTANYL CITRATE (PF) 100 MCG/2ML IJ SOLN: 25.0000 ug | INTRAMUSCULAR | Status: DC | PRN

## 2023-12-09 MED ORDER — PROPOFOL 10 MG/ML IV BOLUS
INTRAVENOUS | Status: DC | PRN
  Administered 2023-12-09: 50 mg via INTRAVENOUS
  Administered 2023-12-09: 90 ug/kg/min via INTRAVENOUS

## 2023-12-09 MED ORDER — ONDANSETRON HCL 4 MG/2ML IJ SOLN
INTRAMUSCULAR | Status: AC
  Filled 2023-12-09: qty 2

## 2023-12-09 MED ORDER — ACETAMINOPHEN 10 MG/ML IV SOLN: 1000.0000 mg | Freq: Once | INTRAVENOUS | Status: DC | PRN

## 2023-12-09 MED ORDER — FENTANYL CITRATE (PF) 100 MCG/2ML IJ SOLN
INTRAMUSCULAR | Status: DC | PRN
  Administered 2023-12-09: 25 ug via INTRAVENOUS
  Administered 2023-12-09: 50 ug via INTRAVENOUS

## 2023-12-09 MED ORDER — LIDOCAINE HCL (CARDIAC) PF 100 MG/5ML IV SOSY
PREFILLED_SYRINGE | INTRAVENOUS | Status: DC | PRN
  Administered 2023-12-09: 60 mg via INTRAVENOUS

## 2023-12-09 MED ORDER — CEFAZOLIN SODIUM-DEXTROSE 2-4 GM/100ML-% IV SOLN
2.0000 g | INTRAVENOUS | Status: AC
  Administered 2023-12-09: 2 g via INTRAVENOUS

## 2023-12-09 MED ORDER — ACETAMINOPHEN 10 MG/ML IV SOLN
INTRAVENOUS | Status: AC
  Filled 2023-12-09: qty 100

## 2023-12-09 MED ORDER — ONDANSETRON HCL 4 MG/2ML IJ SOLN
INTRAMUSCULAR | Status: DC | PRN
  Administered 2023-12-09: 4 mg via INTRAVENOUS

## 2023-12-09 MED ORDER — ACETAMINOPHEN 10 MG/ML IV SOLN
INTRAVENOUS | Status: DC | PRN
  Administered 2023-12-09: 1000 mg via INTRAVENOUS

## 2023-12-09 MED ORDER — SODIUM CHLORIDE 0.9 % IV SOLN: INTRAVENOUS | Status: DC

## 2023-12-09 MED ORDER — LACTATED RINGERS IV SOLN: INTRAVENOUS | Status: DC | PRN

## 2023-12-09 MED ORDER — LIDOCAINE HCL (PF) 2 % IJ SOLN
INTRAMUSCULAR | Status: AC
  Filled 2023-12-09: qty 5

## 2023-12-09 MED ORDER — KETAMINE HCL 50 MG/5ML IJ SOSY
PREFILLED_SYRINGE | INTRAMUSCULAR | Status: AC
  Filled 2023-12-09: qty 5

## 2023-12-09 MED ORDER — OXYCODONE HCL 5 MG/5ML PO SOLN: 5.0000 mg | Freq: Once | ORAL | Status: DC | PRN

## 2023-12-09 MED ORDER — PHENYLEPHRINE 80 MCG/ML (10ML) SYRINGE FOR IV PUSH (FOR BLOOD PRESSURE SUPPORT)
PREFILLED_SYRINGE | INTRAVENOUS | Status: DC | PRN
  Administered 2023-12-09: 80 ug via INTRAVENOUS

## 2023-12-09 MED ORDER — DEXAMETHASONE SOD PHOSPHATE PF 10 MG/ML IJ SOLN
INTRAMUSCULAR | Status: DC | PRN
Start: 2023-12-09 — End: 2023-12-09
  Administered 2023-12-09: 5 mg via INTRAVENOUS

## 2023-12-09 MED ORDER — DROPERIDOL 2.5 MG/ML IJ SOLN: 0.6250 mg | Freq: Once | INTRAMUSCULAR | Status: DC | PRN

## 2023-12-09 MED ORDER — ALBUTEROL SULFATE HFA 108 (90 BASE) MCG/ACT IN AERS
INHALATION_SPRAY | RESPIRATORY_TRACT | Status: DC | PRN
  Administered 2023-12-09: 3 via RESPIRATORY_TRACT

## 2023-12-09 MED ORDER — EPHEDRINE 5 MG/ML INJ
INTRAVENOUS | Status: AC
  Filled 2023-12-09: qty 5

## 2023-12-09 MED ORDER — SUGAMMADEX SODIUM 500 MG/5ML IV SOLN
INTRAVENOUS | Status: DC | PRN
  Administered 2023-12-09: 200 mg via INTRAVENOUS

## 2023-12-09 MED ORDER — PHENYLEPHRINE HCL-NACL 20-0.9 MG/250ML-% IV SOLN
INTRAVENOUS | Status: AC
  Filled 2023-12-09: qty 250

## 2023-12-09 MED ORDER — 0.9 % SODIUM CHLORIDE (POUR BTL) OPTIME
TOPICAL | Status: DC | PRN
  Administered 2023-12-09: 500 mL

## 2023-12-09 MED ORDER — ROCURONIUM BROMIDE 100 MG/10ML IV SOLN
INTRAVENOUS | Status: DC | PRN
Start: 2023-12-09 — End: 2023-12-09
  Administered 2023-12-09: 25 mg via INTRAVENOUS

## 2023-12-09 MED ORDER — KETAMINE HCL 50 MG/5ML IJ SOSY
PREFILLED_SYRINGE | INTRAMUSCULAR | Status: DC | PRN
  Administered 2023-12-09: 10 mg via INTRAVENOUS

## 2023-12-09 MED ORDER — CHLORHEXIDINE GLUCONATE 4 % EX SOLN: 60.0000 mL | Freq: Once | CUTANEOUS | Status: DC

## 2023-12-09 MED ORDER — HEMOSTATIC AGENTS (NO CHARGE) OPTIME
TOPICAL | Status: DC | PRN
  Administered 2023-12-09: 1 via TOPICAL

## 2023-12-09 MED ORDER — CEFAZOLIN SODIUM-DEXTROSE 2-4 GM/100ML-% IV SOLN
INTRAVENOUS | Status: AC
  Filled 2023-12-09: qty 100

## 2023-12-09 MED ORDER — ALBUMIN HUMAN 5 % IV SOLN
INTRAVENOUS | Status: AC
  Filled 2023-12-09: qty 250

## 2023-12-09 NOTE — Anesthesia Preprocedure Evaluation (Signed)
 Anesthesia Evaluation  Patient identified by MRN, date of birth, ID band Patient awake    Reviewed: Allergy & Precautions, H&P , NPO status , Patient's Chart, lab work & pertinent test results, reviewed documented beta blocker date and time   Airway Mallampati: II  TM Distance: >3 FB Neck ROM: full    Dental  (+) Teeth Intact   Pulmonary neg pulmonary ROS   Pulmonary exam normal        Cardiovascular Exercise Tolerance: Poor hypertension, On Medications + Peripheral Vascular Disease  Normal cardiovascular exam Rhythm:regular Rate:Normal     Neuro/Psych  PSYCHIATRIC DISORDERS     Dementia  Neuromuscular disease CVA    GI/Hepatic negative GI ROS, Neg liver ROS,,,  Endo/Other  negative endocrine ROSdiabetes, Poorly Controlled    Renal/GU negative Renal ROS  negative genitourinary   Musculoskeletal   Abdominal   Peds  Hematology negative hematology ROS (+)   Anesthesia Other Findings Past Medical History: No date: Carpal tunnel syndrome on both sides 11/14/2019: CVA (cerebral vascular accident) (HCC) No date: Diabetes (HCC) No date: Diabetic polyneuropathy (HCC) No date: Diverticulitis No date: Hyperlipidemia No date: Hypertension No date: Osteoarthritis 03/22/2018: Sepsis (HCC) 04/09/2023: Suspected UTI 09/20/2019: UTI (urinary tract infection) No date: Vaginal prolapse Past Surgical History: 05/01/2023: AMPUTATION; Left     Comment:  Procedure: AMPUTATION, FOOT, RAY;  Surgeon: Lennie Barter, DPM;  Location: [facility] ORS;  Service:               Orthopedics/Podiatry;  Laterality: Left;  LEFT PARTIAL               FIRST RAY 08/13/2022: AMPUTATION TOE; Left     Comment:  Procedure: EXCISION OF 5TH RAY;  Surgeon: Ashley Soulier, DPM;  Location: [facility] ORS;  Service:               Orthopedics/Podiatry;  Laterality: Left; No date: CESAREAN SECTION 01/09/2022: IRRIGATION AND DEBRIDEMENT FOOT;  Left     Comment:  Procedure: IRRIGATION AND DEBRIDEMENT FOOT WITH BONE               BIOPSY;  Surgeon: Ashley Soulier, DPM;  Location: [facility]               ORS;  Service: Podiatry;  Laterality: Left; 06/11/2021: LOWER EXTREMITY ANGIOGRAPHY; Left     Comment:  Procedure: Lower Extremity Angiography;  Surgeon:               Jama Cordella MATSU, MD;  Location: [facility];               Service: Cardiovascular;  Laterality: Left; 08/11/2022: LOWER EXTREMITY ANGIOGRAPHY; Left     Comment:  Procedure: Lower Extremity Angiography;  Surgeon:               Jama Cordella MATSU, MD;  Location: [facility];               Service: Cardiovascular;  Laterality: Left; 04/30/2023: LOWER EXTREMITY ANGIOGRAPHY; Left     Comment:  Procedure: Lower Extremity Angiography;  Surgeon: Marea Selinda RAMAN, MD;  Location: [facility];  Service:               Cardiovascular;  Laterality: Left;  11/06/2023: LOWER EXTREMITY ANGIOGRAPHY; Left     Comment:  Procedure: Lower Extremity Angiography;  Surgeon:               Jama Cordella MATSU, MD;  Location: [facility];               Service: Cardiovascular;  Laterality: Left; No date: pessary No date: REPLACEMENT TOTAL KNEE BILATERAL No date: VENTRAL HERNIA REPAIR 01/13/2016: VENTRAL HERNIA REPAIR; N/A     Comment:  Procedure: HERNIA REPAIR VENTRAL ADULT WITH SMALL BOWEL               RESECTION, LYSIS OF ADHESIONS;  Surgeon: Dorothyann LITTIE Husk, MD;  Location: [facility] ORS;  Service: General;                Laterality: N/A; BMI    Body Mass Index: 20.78 kg/m     Reproductive/Obstetrics negative OB ROS                              Anesthesia Physical Anesthesia Plan  ASA: 3  Anesthesia Plan: General ETT   Post-op Pain Management:    Induction:   PONV Risk Score and Plan: 4 or greater  Airway Management Planned:   Additional Equipment:   Intra-op Plan:   Post-operative Plan:    Informed Consent: I have reviewed the patients History and Physical, chart, labs and discussed the procedure including the risks, benefits and alternatives for the proposed anesthesia with the patient or authorized representative who has indicated his/her understanding and acceptance.     Dental Advisory Given  Plan Discussed with: CRNA  Anesthesia Plan Comments:         Anesthesia Quick Evaluation

## 2023-12-09 NOTE — Progress Notes (Signed)
 Progress Note    [name]  FMW:[id number] DOB: [birth year]  DOA: 12/04/2023 PCP: Rudolpho Norleen BIRCH, MD      Brief Narrative:    Medical records reviewed and are as summarized below:  [name] is a 89 y.o. female with medical history significant for dementia, hypertension, hyperlipidemia, type II DM, PAD, CAD, stroke with left hemiparesis, history of left foot wounds, hospitalized from 11/06/2023 through 11/11/2023 for critical left lower extremity ischemia s/p angioplasty of left lower extremity on 11/06/2023.  She was referred from the wound care clinic to the ED for further evaluation of left foot wound because of concern for gangrene/abscess.    She was found to have osteomyelitis of the left foot.      Assessment/Plan:   Principal Problem:   Acute osteomyelitis of toe, left (HCC) Active Problems:   Type 2 diabetes mellitus with polyneuropathy (HCC)   Mixed hyperlipidemia   Essential hypertension   Atherosclerosis of native arteries of the extremities with ulceration (HCC)   Gangrene of toe (HCC)   Body mass index is 20.78 kg/m.  Left foot osteomyelitis, leukocytosis: S/p left above-the-knee amputation on 12/09/2023.  Analgesics as needed for pain. Discontinue IV Zosyn  and vancomycin .  Follow-up with vascular surgeon.   Hypertension: Continue lisinopril    Mild hypoglycemia: Improved.   Type II DM with hyperglycemia: NovoLog  as needed for hyperglycemia.  Recent hemoglobin A1c on 11/07/2023 was 5.9. Hemoglobin A1c from 08/08/2022 was 8.1.     CAD, PVD s/p left lower extremity angioplasty on 11/06/2023, history of stroke: Continue aspirin  and Lipitor    History of stroke with left hemiparesis.  Patient is nonambulatory at baseline.   Comorbidities include hyperlipidemia, dementia, chronic anemia     Diet Order             Diet Carb Modified Fluid consistency: Thin; Room service appropriate? Yes with Assist  Diet effective now                                   Consultants: Podiatrist Vascular surgeon  Procedures: S/p left above-the-knee amputation on 12/09/2023    Medications:    (feeding supplement) PROSource Plus  30 mL Oral TID BM   vitamin C   500 mg Oral BID   aspirin  EC  81 mg Oral Daily   atorvastatin   80 mg Oral Daily   cyanocobalamin   1,000 mcg Oral Daily   feeding supplement  237 mL Oral BID BM   heparin   5,000 Units Subcutaneous Q8H   hydrocerin   Topical BID   insulin  aspart  0-5 Units Subcutaneous QHS   insulin  aspart  0-9 Units Subcutaneous TID WC   lisinopril   20 mg Oral Daily   multivitamin with minerals  1 tablet Oral Daily   zinc sulfate (50mg  elemental zinc)  220 mg Oral Daily   Continuous Infusions:     Anti-infectives (From admission, onward)    Start     Dose/Rate Route Frequency Ordered Stop   12/09/23 0659  ceFAZolin  (ANCEF ) IVPB 2g/100 mL premix        2 g 200 mL/hr over 30 Minutes Intravenous 30 min pre-op 12/09/23 0700 12/09/23 0759   12/05/23 1300  vancomycin  (VANCOREADY) IVPB 750 mg/150 mL  Status:  Discontinued        750 mg 150 mL/hr over 60 Minutes Intravenous Every 24 hours 12/04/23 2108 12/09/23 1238   12/04/23 2100  piperacillin -tazobactam (ZOSYN ) IVPB 3.375 g  Status:  Discontinued        3.375 g 12.5 mL/hr over 240 Minutes Intravenous Every 8 hours 12/04/23 2028 12/09/23 1238   12/04/23 1200  piperacillin -tazobactam (ZOSYN ) IVPB 3.375 g        3.375 g 100 mL/hr over 30 Minutes Intravenous  Once 12/04/23 1148 12/04/23 1255   12/04/23 1200  vancomycin  (VANCOREADY) IVPB 1500 mg/300 mL        1,500 mg 150 mL/hr over 120 Minutes Intravenous  Once 12/04/23 1151 12/04/23 1504              Family Communication/Anticipated D/C date and plan/Code Status   DVT prophylaxis: heparin  injection 5,000 Units Start: 12/04/23 2200 SCDs Start: 12/04/23 1403     Code Status: Limited: Do not attempt resuscitation (DNR) -DNR-LIMITED -Do Not  Intubate/DNI   Family Communication: None Disposition Plan: Plan to discharge to SNF   Status is: Inpatient Remains inpatient appropriate because: Left foot osteomyelitis       Subjective:   Interval events noted.  She complains of pain at the left AKA stump site  Objective:    Vitals:   12/09/23 0900 12/09/23 0915 12/09/23 0925 12/09/23 1005  BP: (!) 164/73 (!) 166/70 (!) 169/72 (!) 159/65  Pulse: 66 66 69 64  Resp: 15 16 17    Temp:   (!) 97 F (36.1 C) (!) 97.4 F (36.3 C)  TempSrc:    Oral  SpO2: 100% 100% 100% 100%  Weight:      Height:       No data found.   Intake/Output Summary (Last 24 hours) at 12/09/2023 1238 Last data filed at 12/09/2023 0924 Gross per 24 hour  Intake 1100 ml  Output 100 ml  Net 1000 ml   Filed Weights   12/04/23 2101  Weight: 54.9 kg    Exam:  GEN: NAD SKIN: Warm and dry EYES: No pallor or icterus ENT: MMM CV: RRR PULM: No wheezing or rales head ABD: soft, ND, NT, +BS CNS: AAO x 3, non focal EXT: No edema or tenderness    Data Reviewed:   I have personally reviewed following labs and imaging studies:  Labs: Labs show the following:   Basic Metabolic Panel: Recent Labs  Lab 12/04/23 1211 12/05/23 0516 12/06/23 0338  NA 143 143 142  K 4.1 3.4* 4.0  CL 102 103 108  CO2 27 25 23   GLUCOSE 192* 117* 69*  BUN 19 19 18   CREATININE 0.66 0.56 0.55  CALCIUM  9.1 8.5* 8.1*  MG  --   --  2.1  PHOS  --   --  3.4   GFR Estimated Creatinine Clearance: 42.8 mL/min (by C-G formula based on SCr of 0.55 mg/dL). Liver Function Tests: Recent Labs  Lab 12/04/23 1211 12/06/23 0338  AST 21  --   ALT 10  --   ALKPHOS 85  --   BILITOT 0.7  --   PROT 8.6*  --   ALBUMIN 2.9* 2.1*   No results for input(s): LIPASE, AMYLASE in the last 168 hours. No results for input(s): AMMONIA in the last 168 hours. Coagulation profile No results for input(s): INR, PROTIME in the last 168 hours.  CBC: Recent Labs  Lab  12/04/23 1211 12/05/23 0516 12/06/23 0338  WBC 16.8* 16.6* 14.5*  NEUTROABS 14.1*  --   --   HGB 10.0* 8.3* 7.8*  HCT 33.6* 27.0* 26.3*  MCV 94.6 92.2 95.3  PLT 484* 434* 388  Cardiac Enzymes: No results for input(s): CKTOTAL, CKMB, CKMBINDEX, TROPONINI in the last 168 hours. BNP (last 3 results) No results for input(s): PROBNP in the last 8760 hours. CBG: Recent Labs  Lab 12/08/23 1143 12/08/23 1543 12/08/23 2017 12/09/23 0650 12/09/23 0856  GLUCAP 279* 207* 202* 151* 167*   D-Dimer: No results for input(s): DDIMER in the last 72 hours. Hgb A1c: No results for input(s): HGBA1C in the last 72 hours. Lipid Profile: No results for input(s): CHOL, HDL, LDLCALC, TRIG, CHOLHDL, LDLDIRECT in the last 72 hours. Thyroid  function studies: No results for input(s): TSH, T4TOTAL, T3FREE, THYROIDAB in the last 72 hours.  Invalid input(s): FREET3 Anemia work up: No results for input(s): VITAMINB12, FOLATE, FERRITIN, TIBC, IRON, RETICCTPCT in the last 72 hours. Sepsis Labs: Recent Labs  Lab 12/04/23 1211 12/05/23 0516 12/06/23 0338  WBC 16.8* 16.6* 14.5*  LATICACIDVEN 1.6  --   --     Microbiology Recent Results (from the past 240 hours)  Aerobic/Anaerobic Culture w Gram Stain (surgical/deep wound)     Status: None (Preliminary result)   Collection Time: 12/04/23  7:54 PM   Specimen: Foot; Wound  Result Value Ref Range Status   Specimen Description   Final    FOOT Performed at [facility], [address] [place], [place] [address]    Special Requests   Final    Immunocompromised Performed at [facility], [address] [place], [place] [address]    Gram Stain   Final    FEW WBC PRESENT,BOTH PMN AND MONONUCLEAR FEW GRAM POSITIVE COCCI Performed at [facility], [address] [place], [place] [address]    Culture   Final    MODERATE METHICILLIN RESISTANT STAPHYLOCOCCUS AUREUS NO  ANAEROBES ISOLATED; CULTURE IN PROGRESS FOR 5 DAYS    Report Status PENDING  Incomplete   Organism ID, Bacteria METHICILLIN RESISTANT STAPHYLOCOCCUS AUREUS  Final      Susceptibility   Methicillin resistant staphylococcus aureus - MIC*    CIPROFLOXACIN >=8 RESISTANT Resistant     ERYTHROMYCIN >=8 RESISTANT Resistant     GENTAMICIN <=0.5 SENSITIVE Sensitive     OXACILLIN >=4 RESISTANT Resistant     TETRACYCLINE <=1 SENSITIVE Sensitive     VANCOMYCIN  1 SENSITIVE Sensitive     TRIMETH/SULFA <=10 SENSITIVE Sensitive     CLINDAMYCIN <=0.25 SENSITIVE Sensitive     RIFAMPIN <=0.5 SENSITIVE Sensitive     Inducible Clindamycin NEGATIVE Sensitive     LINEZOLID 2 SENSITIVE Sensitive     * MODERATE METHICILLIN RESISTANT STAPHYLOCOCCUS AUREUS  Blood culture (routine x 2)     Status: None   Collection Time: 12/04/23  8:06 PM   Specimen: BLOOD  Result Value Ref Range Status   Specimen Description BLOOD BLOOD RIGHT FOREARM  Final   Special Requests   Final    BOTTLES DRAWN AEROBIC AND ANAEROBIC Blood Culture results may not be optimal due to an inadequate volume of blood received in culture bottles   Culture   Final    NO GROWTH 5 DAYS Performed at [facility], [address] [place], [place] [address]    Report Status 12/09/2023 FINAL  Final  Blood culture (routine x 2)     Status: None   Collection Time: 12/04/23  8:07 PM   Specimen: BLOOD  Result Value Ref Range Status   Specimen Description BLOOD BLOOD LEFT FOREARM  Final   Special Requests   Final    BOTTLES DRAWN AEROBIC AND  ANAEROBIC Blood Culture results may not be optimal due to an inadequate volume of blood received in culture bottles   Culture   Final    NO GROWTH 5 DAYS Performed at [facility], [address] [place], [place] [address]    Report Status 12/09/2023 FINAL  Final    Procedures and diagnostic studies:  No results found.              LOS: 5 days   Evelen Vazguez  [facility]   Pager on [email or web address]. If 7PM-7AM, please contact night-coverage at [email or web address]     12/09/2023, 12:38 PM

## 2023-12-09 NOTE — Transfer of Care (Signed)
 Immediate Anesthesia Transfer of Care Note  Patient: [name]  Procedure(s) Performed: AMPUTATION, ABOVE KNEE (Left: Knee)  Patient Location: PACU  Anesthesia Type:General  Level of Consciousness: drowsy, patient cooperative, and responds to stimulation  Airway & Oxygen Therapy: Patient Spontanous Breathing and Patient connected to face mask oxygen  Post-op Assessment: Report given to RN and Post -op Vital signs reviewed and stable  Post vital signs: stable  Last Vitals:  Vitals Value Taken Time  BP 144/65 12/09/23 08:50  Temp 36.2 C 12/09/23 08:50  Pulse 65 12/09/23 08:55  Resp 29 12/09/23 08:55  SpO2 100 % 12/09/23 08:55  Vitals shown include unfiled device data.  Last Pain:  Vitals:   12/09/23 0850  TempSrc:   PainSc: Asleep      Patients Stated Pain Goal: 0 (12/07/23 1125)  Complications: No notable events documented.

## 2023-12-09 NOTE — Op Note (Signed)
  [facility]   OPERATIVE NOTE   PROCEDURE:  Left above-the-knee amputation  PRE-OPERATIVE DIAGNOSIS: Left foot osteomyelitis, left leg contracture  POST-OPERATIVE DIAGNOSIS: same as above  SURGEON:  Selinda Gu, MD  ASSISTANT(S): Gwendlyn Shank, NP  ANESTHESIA: general  ESTIMATED BLOOD LOSS: 100 cc  FINDING(S): none  SPECIMEN(S):  Left above-the-knee amputation  INDICATIONS:   [name] is a 89 y.o. female who presents with left foot osteomyelitis and a flexion contracture of the left knee and leg.  This is clearly not a salvageable situation and anything more proximal than an AKA would not heal due to the contracture.The patient is scheduled for a left above-the-knee amputation.  I discussed in depth with the patient the risks, benefits, and alternatives to this procedure.  The patient is aware that the risk of this operation included but are not limited to:  bleeding, infection, myocardial infarction, stroke, death, failure to heal amputation wound, and possible need for more proximal amputation.  The patient is aware of the risks and agrees proceed forward with the procedure.  DESCRIPTION: After full informed written consent was obtained from the patient, the patient was taken to the operating room, and placed supine upon the operating table.  Prior to induction, the patient received IV antibiotics.  The patient was then prepped and draped in the standard fashion for a left above-the-knee amputation.  After obtaining adequate anesthesia, the patient was prepped and draped in the standard fashion for a above-the-knee amputation.  I marked out the anterior and posterior flaps for a fish-mouth type of amputation.  I made the incisions for these flaps, and then dissected through the subcutaneous tissue, fascia, and muscles circumferentially.  I elevated  the periosteal tissue 4-5 cm more proximal than the anterior skin flap.  I then transected the femur with a  power saw at this level.  Then I smoothed out the rough edges of the bone.  At this point, the specimen was passed off the field as the above-the-knee amputation.  At this point, I clamped all visibly bleeding arteries and veins using a combination of suture ligation with silk suture and electrocautery.   Bleeding continued to be controlled with electrocautery and suture ligature.  The stump was washed off with sterile normal saline and no further active bleeding was noted.  I reapproximated the anterior and posterior fascia  with interrupted stitches of 0 Vicryl.  This was completed along the entire length of anterior and posterior fascia until there were no more loose space in the fascial line. The subcutaneous tissue was then approximated with 2-0 vicryl sutures. The skin was then  reapproximated with staples.  The stump was washed off and dried.  The incision was dressed with Xeroform and ABD pads, and  then fluffs were applied.  Kerlix was wrapped around the leg and then gently an ACE wrap was applied.  A large Ioban was then placed over the ACE wrap to secure the dressing. The patient was then awakened and take to the recovery room in stable condition.   COMPLICATIONS: none  CONDITION: stable  Selinda Gu  12/09/2023, 8:40 AM   This note was created with Dragon Medical transcription system. Any errors in dictation are purely unintentional.

## 2023-12-09 NOTE — Anesthesia Procedure Notes (Signed)
 Procedure Name: Intubation Date/Time: 12/09/2023 7:44 AM  Performed by: Gillermo Spruce I, CRNAPre-anesthesia Checklist: Patient identified, Emergency Drugs available, Suction available and Patient being monitored Patient Re-evaluated:Patient Re-evaluated prior to induction Oxygen Delivery Method: Circle system utilized Preoxygenation: Pre-oxygenation with 100% oxygen Induction Type: IV induction Ventilation: Mask ventilation without difficulty Laryngoscope Size: McGrath and 3 Grade View: Grade I Tube type: Oral Tube size: 6.5 mm Number of attempts: 1 Airway Equipment and Method: Stylet and Oral airway Placement Confirmation: ETT inserted through vocal cords under direct vision, positive ETCO2 and breath sounds checked- equal and bilateral Secured at: 20 cm Tube secured with: Tape Dental Injury: Teeth and Oropharynx as per pre-operative assessment

## 2023-12-09 NOTE — TOC Progression Note (Signed)
 Transition of Care [facility]) - Progression Note    Patient Details  Name: [name] MRN: [id number] Date of Birth: 1934-01-09  [facility] CM/SW Contact  Corean ONEIDA Haddock, RN Phone Number: 12/09/2023, 1:44 PM  Clinical Narrative:     VM left for [facility] with DSS, per bedside request   Expected Discharge Plan: Long Term Acute Care (LTAC) Barriers to Discharge: Continued Medical Work up    DSS Service county: [facility]          Expected Discharge Plan and Services       Living arrangements for the past 2 months: Skilled Nursing Facility                                       Social Drivers of Health (SDOH) Interventions SDOH Screenings   Food Insecurity: Patient Unable To Answer (12/05/2023)  Housing: Unknown (12/05/2023)  Transportation Needs: Patient Unable To Answer (12/05/2023)  Utilities: Patient Unable To Answer (12/05/2023)  Social Connections: Patient Unable To Answer (12/05/2023)  Tobacco Use: Low Risk  (12/09/2023)    Readmission Risk Interventions    05/01/2023    4:26 PM  Readmission Risk Prevention Plan  Transportation Screening Complete  HRI or Home Care Consult Complete  Social Work Consult for Recovery Care Planning/Counseling Complete  Medication Review Oceanographer) Complete

## 2023-12-09 NOTE — Plan of Care (Signed)

## 2023-12-09 NOTE — Interval H&P Note (Signed)
 History and Physical Interval Note:  12/09/2023 7:16 AM  [name]  has presented today for surgery, with the diagnosis of PAD.  The various methods of treatment have been discussed with the patient and family. After consideration of risks, benefits and other options for treatment, the patient has consented to  Procedure(s): AMPUTATION, ABOVE KNEE (Left) as a surgical intervention.  The patient's history has been reviewed, patient examined, no change in status, stable for surgery.  I have reviewed the patient's chart and labs.  Questions were answered to the patient's satisfaction.     [name]

## 2023-12-09 NOTE — Plan of Care (Signed)
  Problem: Education: Goal: Individualized Educational Video(s) Outcome: Progressing   Problem: Fluid Volume: Goal: Ability to maintain a balanced intake and output will improve Outcome: Progressing   Problem: Health Behavior/Discharge Planning: Goal: Ability to manage health-related needs will improve Outcome: Progressing   Problem: Nutritional: Goal: Progress toward achieving an optimal weight will improve Outcome: Progressing

## 2023-12-10 ENCOUNTER — Encounter: Payer: Self-pay | Admitting: Vascular Surgery

## 2023-12-10 DIAGNOSIS — M86172 Other acute osteomyelitis, left ankle and foot: Secondary | ICD-10-CM | POA: Diagnosis not present

## 2023-12-10 DIAGNOSIS — Z89612 Acquired absence of left leg above knee: Secondary | ICD-10-CM

## 2023-12-10 DIAGNOSIS — Z9889 Other specified postprocedural states: Secondary | ICD-10-CM

## 2023-12-10 LAB — CBC
HCT: 23.2 % — ABNORMAL LOW (ref 36.0–46.0)
Hemoglobin: 7 g/dL — ABNORMAL LOW (ref 12.0–15.0)
MCH: 28.2 pg (ref 26.0–34.0)
MCHC: 30.2 g/dL (ref 30.0–36.0)
MCV: 93.5 fL (ref 80.0–100.0)
Platelets: 442 K/uL — ABNORMAL HIGH (ref 150–400)
RBC: 2.48 MIL/uL — ABNORMAL LOW (ref 3.87–5.11)
RDW: 16.3 % — ABNORMAL HIGH (ref 11.5–15.5)
WBC: 18 K/uL — ABNORMAL HIGH (ref 4.0–10.5)
nRBC: 0 % (ref 0.0–0.2)

## 2023-12-10 LAB — PREPARE RBC (CROSSMATCH)

## 2023-12-10 LAB — GLUCOSE, CAPILLARY
Glucose-Capillary: 175 mg/dL — ABNORMAL HIGH (ref 70–99)
Glucose-Capillary: 218 mg/dL — ABNORMAL HIGH (ref 70–99)
Glucose-Capillary: 172 mg/dL — ABNORMAL HIGH (ref 70–99)
Glucose-Capillary: 146 mg/dL — ABNORMAL HIGH (ref 70–99)

## 2023-12-10 LAB — AEROBIC/ANAEROBIC CULTURE W GRAM STAIN (SURGICAL/DEEP WOUND)

## 2023-12-10 LAB — COPPER, SERUM: Copper: 180 ug/dL — ABNORMAL HIGH (ref 80–158)

## 2023-12-10 LAB — SURGICAL PATHOLOGY

## 2023-12-10 MED ORDER — SODIUM CHLORIDE 0.9% IV SOLUTION: Freq: Once | INTRAVENOUS | Status: AC

## 2023-12-10 NOTE — [place] FL2 (Signed)
 [place]  MEDICAID FL2 LEVEL OF CARE FORM     IDENTIFICATION  Patient Name: [name] Birthdate: [birth year] Sex: female Admission Date (Current Location): 12/04/2023  [facility] and [place] Number:  Chiropodist and Address:         Provider Number: [id number]  Attending Physician Name and Address:  Jens Durand, MD  Relative Name and Phone Number:       Current Level of Care: Hospital Recommended Level of Care: Nursing Facility Prior Approval Number:    Date Approved/Denied:   PASRR Number:    Discharge Plan: SNF    Current Diagnoses: Patient Active Problem List   Diagnosis Date Noted   S/P AKA (above knee amputation) unilateral, left (HCC) 12/10/2023   Acute osteomyelitis of toe, left (HCC) 12/04/2023   Critical limb ischemia of left lower extremity (HCC) 11/06/2023   Ischemic foot ulcer due to atherosclerosis of native artery of limb (HCC) 11/06/2023   Protein-calorie malnutrition, severe 04/30/2023   Gangrene of toe (HCC) 04/28/2023   Dementia (HCC) 04/09/2023   DJD (degenerative joint disease) 01/21/2023   Ischemia of left lower extremity 08/27/2022   Osteomyelitis of left foot (HCC) 08/27/2022   Gangrene of toe of left foot (HCC) 08/26/2022   Acute osteomyelitis of left foot (HCC) 08/08/2022   Diabetic foot infection (HCC) 08/07/2022   Hemiplegia affecting left side in left-dominant patient as late effect of cerebrovascular disease (HCC) 01/10/2022   Vaginal candidiasis 01/10/2022   Leg wound, left 01/08/2022   Hypoglycemia 01/07/2022   Leukocytosis 01/07/2022   MRI contraindicated due to metal implant 01/07/2022   Open wound of left foot 01/07/2022   Atherosclerosis of native arteries of the extremities with ulceration (HCC) 06/06/2021   Pressure injury of skin 06/01/2021   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   PAD (peripheral artery disease) 05/17/2021   Right hemiparesis (HCC) 05/16/2021   Cellulitis of left foot 05/15/2021    Hypokalemia 05/15/2021   History of stroke 10/07/2019   Altered mental status 10/06/2019   Carotid artery disease 09/28/2019   Left hemiparesis (HCC) 09/28/2019   Hemiplegia and hemiparesis following cerebral infarction affecting right dominant side (HCC) 09/27/2019   Primary osteoarthritis of both hips 11/06/2017   Microalbuminuria 11/02/2017   Cystocele, midline 09/04/2017   Incomplete uterine prolapse 09/04/2017   Ankle edema 04/27/2017   Malnutrition of moderate degree 01/15/2016   Dehydration    Incarcerated ventral hernia    Diabetic ketoacidosis without coma associated with type 2 diabetes mellitus (HCC)    Type 2 diabetes mellitus with polyneuropathy (HCC) 08/06/2015   Carpal tunnel syndrome, bilateral 07/10/2015   Osteopenia of multiple sites 07/10/2015   Arthralgia of multiple sites 06/27/2015   Mixed hyperlipidemia 11/06/2014   Hypomagnesemia 11/02/2013   Essential hypertension 11/02/2013    Orientation RESPIRATION BLADDER Height & Weight     Self  Normal Incontinent Weight: 47.8 kg Height:  5' 4 (162.6 cm)  BEHAVIORAL SYMPTOMS/MOOD NEUROLOGICAL BOWEL NUTRITION STATUS      Continent Diet (Carb modified)  AMBULATORY STATUS COMMUNICATION OF NEEDS Skin   Total Care Verbally Surgical wounds                       Personal Care Assistance Level of Assistance              Functional Limitations Info  Hearing, Sight Sight Info: Impaired Hearing Info: Impaired      SPECIAL CARE FACTORS FREQUENCY  Contractures Contractures Info: Not present    Additional Factors Info  Code Status, Allergies, Isolation Precautions Code Status Info: DNR Allergies Info: Metformin  and related     Isolation Precautions Info: contact MRSA     Current Medications (12/10/2023):  This is the current hospital active medication list Current Facility-Administered Medications  Medication Dose Route Frequency Provider Last Rate Last Admin    (feeding supplement) PROSource Plus liquid 30 mL  30 mL Oral TID BM Dew, Jason S, MD   30 mL at 12/10/23 9041   acetaminophen  (TYLENOL ) tablet 650 mg  650 mg Oral Q6H PRN Dew, Jason S, MD       Or   acetaminophen  (TYLENOL ) suppository 650 mg  650 mg Rectal Q6H PRN Marea Selinda RAMAN, MD       ascorbic acid  (VITAMIN C ) tablet 500 mg  500 mg Oral BID Dew, Jason S, MD   500 mg at 12/10/23 9043   aspirin  EC tablet 81 mg  81 mg Oral Daily Dew, Jason S, MD   81 mg at 12/10/23 9043   atorvastatin  (LIPITOR ) tablet 80 mg  80 mg Oral Daily Dew, Jason S, MD   80 mg at 12/10/23 9044   cyanocobalamin  (VITAMIN B12) tablet 1,000 mcg  1,000 mcg Oral Daily Dew, Jason S, MD   1,000 mcg at 12/10/23 0956   feeding supplement (ENSURE PLUS HIGH PROTEIN) liquid 237 mL  237 mL Oral BID BM Dew, Jason S, MD   237 mL at 12/09/23 1500   heparin  injection 5,000 Units  5,000 Units Subcutaneous Q8H Dew, Jason S, MD   5,000 Units at 12/10/23 0531   hydrocerin (EUCERIN) cream   Topical BID Dew, Jason S, MD   Given at 12/10/23 9043   HYDROmorphone  (DILAUDID ) injection 0.5 mg  0.5 mg Intravenous Q4H PRN Dew, Jason S, MD   0.5 mg at 12/10/23 1205   insulin  aspart (novoLOG ) injection 0-5 Units  0-5 Units Subcutaneous QHS Dew, Jason S, MD   3 Units at 12/09/23 2145   insulin  aspart (novoLOG ) injection 0-9 Units  0-9 Units Subcutaneous TID WC Dew, Jason S, MD   3 Units at 12/10/23 1231   lisinopril  (ZESTRIL ) tablet 20 mg  20 mg Oral Daily Dew, Jason S, MD   20 mg at 12/10/23 0956   multivitamin with minerals tablet 1 tablet  1 tablet Oral Daily Dew, Jason S, MD   1 tablet at 12/10/23 9043   oxyCODONE  (Oxy IR/ROXICODONE ) immediate release tablet 5 mg  5 mg Oral Q4H PRN Dew, Jason S, MD       senna-docusate (Senokot-S) tablet 1 tablet  1 tablet Oral QHS PRN Dew, Jason S, MD       zinc sulfate (50mg  elemental zinc) capsule 220 mg  220 mg Oral Daily Dew, Jason S, MD   220 mg at 12/10/23 9043     Discharge Medications: Please see discharge  summary for a list of discharge medications.  Relevant Imaging Results:  Relevant Lab Results:   Additional Information SSN: [id number]  Corean ONEIDA Haddock, RN

## 2023-12-10 NOTE — Evaluation (Signed)
 Occupational Therapy Evaluation Patient Details Name: [name] MRN: [id number] DOB: [birth year] Today's Date: 12/10/2023   History of Present Illness   89 y/o female presented to ED on 12/04/23 from wound care for wound check. Found to have acute osteomyelitis of L foot. Denies surgical intervention. S/p L AKA on 10/29. PMH: dementia, diabetes, CVA with L hemiparesis, HTN     Clinical Impressions Upon entering the room, pt supine in bed with son present in room feeding pt from breakfast tray. Pt is cooperative and pleasant throughout session. Pt lives at LTC facility and is non ambulatory at baseline. Pt is hoyer lift transfer and staff assist with all ADLs at bed level. Pt is at current baseline level after surgery and session spent educating family on positioning and pain management education in regards to residual limb. Son verbalized understanding. +2 assistance to reposition pt in bed. Call bell and all needed items within reach. Pt with no skilled OT needs at this time.OT to complete order.       Functional Status Assessment   Patient has not had a recent decline in their functional status     Equipment Recommendations   None recommended by OT      Precautions/Restrictions   Precautions Precautions: Fall Recall of Precautions/Restrictions: Impaired Precaution/Restrictions Comments: new L AKA Restrictions Weight Bearing Restrictions Per Provider Order: No     Mobility Bed Mobility Overal bed mobility: Needs Assistance             General bed mobility comments: totalA+2 for repositioning in bed    Transfers                   General transfer comment: dependent care at baseline -hoyer lift transfer          ADL either performed or assessed with clinical judgement   ADL Overall ADL's : At baseline                                       General ADL Comments: total A     Vision Patient Visual Report: No change from  baseline              Pertinent Vitals/Pain Pain Assessment Pain Assessment: Faces Faces Pain Scale: Hurts even more Pain Location: L AKA with movement Pain Descriptors / Indicators: Grimacing     Extremity/Trunk Assessment Upper Extremity Assessment Upper Extremity Assessment: Generalized weakness   Lower Extremity Assessment Lower Extremity Assessment: Generalized weakness LLE Deficits / Details: L hip flexion contracture       Communication Communication Communication: No apparent difficulties   Cognition Arousal: Alert Behavior During Therapy: WFL for tasks assessed/performed Cognition: History of cognitive impairments                               Following commands: Impaired Following commands impaired: Follows one step commands inconsistently     Cueing  General Comments   Cueing Techniques: Verbal cues              Home Living Family/patient expects to be discharged to:: Skilled nursing facility                                 Additional Comments: From [facility] LTC  Prior Functioning/Environment Prior Level of Function : Needs assist             Mobility Comments: Per son in room, pt is non ambulatory and has been for multiple years. She is hoyer lifted to wheelchair. ADLs Comments: PEr son in room, staff assist with all self care needs from bed level. She is inconsistent with feeding herself.     AM-PAC OT 6 Clicks Daily Activity     Outcome Measure Help from another person eating meals?: A Lot Help from another person taking care of personal grooming?: A Lot Help from another person toileting, which includes using toliet, bedpan, or urinal?: Total Help from another person bathing (including washing, rinsing, drying)?: Total Help from another person to put on and taking off regular upper body clothing?: Total Help from another person to put on and taking off regular lower body clothing?: Total 6  Click Score: 8   End of Session    Activity Tolerance: Patient tolerated treatment well Patient left: in bed;with call bell/phone within reach;with bed alarm set;with family/visitor present                   Time: 9061-9048 OT Time Calculation (min): 13 min Charges:  OT General Charges $OT Visit: 1 Visit OT Evaluation $OT Eval Moderate Complexity: 1 [address] [place], OTR/L , CBIS ascom [phone]  12/10/23, 1:15 PM

## 2023-12-10 NOTE — Evaluation (Signed)
 Physical Therapy Evaluation Patient Details Name: [name] MRN: [id number] DOB: [birth year] Today's Date: 12/10/2023  History of Present Illness  89 y/o female presented to ED on 12/04/23 from wound care for wound check. Found to have acute osteomyelitis of L foot. Denies surgical intervention. S/p L AKA on 10/29. PMH: dementia, diabetes, CVA with L hemiparesis, HTN  Clinical Impression  Patient admitted with the above and seen following L AKA. PTA, patient is resident for LTC at [facility] where she is mostly bedbound and non ambulatory for >4 years. Staff utilizes [employer] lift for OOB into w/c if able. Dependent for all ADL/iADLs by staff. Son present during session. Oriented to self. Education provided to son about phantom limb pain, desensitization techniques, and limb positioning (although patient has L hip flexion contracture at baseline), son verbalized understanding. Required totalA+2 for repositioning in bed and placed in modified chair position. No further skilled PT needs identified acutely. Recommend return to facility with no PT follow up.         If plan is discharge home, recommend the following: Two people to help with walking and/or transfers;Two people to help with bathing/dressing/bathroom;Assistance with cooking/housework;Assistance with feeding;Direct supervision/assist for medications management;Direct supervision/assist for financial management;Supervision due to cognitive status   Can travel by private vehicle   No    Equipment Recommendations None recommended by PT  Recommendations for Other Services       Functional Status Assessment Patient has not had a recent decline in their functional status     Precautions / Restrictions Precautions Precautions: Fall Recall of Precautions/Restrictions: Impaired Precaution/Restrictions Comments: new L AKA Restrictions Weight Bearing Restrictions Per Provider Order: No      Mobility  Bed Mobility Overal bed  mobility: Needs Assistance             General bed mobility comments: totalA+2 for repositioning in bed    Transfers                        Ambulation/Gait                  Stairs            Wheelchair Mobility     Tilt Bed    Modified Rankin (Stroke Patients Only)       Balance                                             Pertinent Vitals/Pain Pain Assessment Pain Assessment: Faces Faces Pain Scale: Hurts even more Pain Location: L AKA with movement Pain Descriptors / Indicators: Grimacing Pain Intervention(s): Monitored during session, Repositioned    Home Living Family/patient expects to be discharged to:: Skilled nursing facility                   Additional Comments: From [facility]    Prior Function Prior Level of Function : Needs assist             Mobility Comments: Pt unable to report PLOF d/t poor cognition. Per previous admission: Non ambulatory for multiple years per her guardian, reports facility staff use lift or pick her up to place her in wheelchair. ADLs Comments: Pt unable to report PLOF d/t poor cognition. Per previous admission: Able to feed herself after her food has been cut up and brought close to  her. Dependent for asssitance for all ADL/IADLs at baseline     Extremity/Trunk Assessment   Upper Extremity Assessment Upper Extremity Assessment: Defer to OT evaluation    Lower Extremity Assessment Lower Extremity Assessment: LLE deficits/detail LLE Deficits / Details: L hip flexion contracture       Communication   Communication Communication: No apparent difficulties    Cognition Arousal: Alert Behavior During Therapy: WFL for tasks assessed/performed   PT - Cognitive impairments: History of cognitive impairments                       PT - Cognition Comments: oriented to self. Son present and states this is baseline Following commands: Impaired Following  commands impaired: Follows one step commands inconsistently     Cueing       General Comments      Exercises Other Exercises Other Exercises: Education provided to son about phantom limb pain and desensitization techniques as well as providing support to leg if painful. Patient maintains hip flexed on L which makes it difficult for appropriate positioning   Assessment/Plan    PT Assessment Patient does not need any further PT services  PT Problem List         PT Treatment Interventions      PT Goals (Current goals can be found in the Care Plan section)  Acute Rehab PT Goals Patient Stated Goal: did not state PT Goal Formulation: All assessment and education complete, DC therapy    Frequency       Co-evaluation               AM-PAC PT 6 Clicks Mobility  Outcome Measure Help needed turning from your back to your side while in a flat bed without using bedrails?: Total Help needed moving from lying on your back to sitting on the side of a flat bed without using bedrails?: Total Help needed moving to and from a bed to a chair (including a wheelchair)?: Total Help needed standing up from a chair using your arms (e.g., wheelchair or bedside chair)?: Total Help needed to walk in hospital room?: Total Help needed climbing 3-5 steps with a railing? : Total 6 Click Score: 6    End of Session   Activity Tolerance: Patient tolerated treatment well Patient left: in bed;with call bell/phone within reach;with bed alarm set;with family/visitor present Nurse Communication: Mobility status PT Visit Diagnosis: Muscle weakness (generalized) (M62.81)    Time: 9061-9048 PT Time Calculation (min) (ACUTE ONLY): 13 min   Charges:   PT Evaluation $PT Eval Moderate Complexity: 1 Mod   PT General Charges $$ ACUTE PT VISIT: 1 Visit         Maryanne Finder, PT, DPT Physical Therapist - [facility]  [facility]   Delaila Nand A Demitri Kucinski 12/10/2023, 11:48  AM

## 2023-12-10 NOTE — Progress Notes (Addendum)
 Progress Note    [name]  FMW:[id number] DOB: September 17, 1933  DOA: 12/04/2023 PCP: Rudolpho Norleen BIRCH, MD      Brief Narrative:    Medical records reviewed and are as summarized below:  MARITZA HOSTERMAN is a 89 y.o. female with medical history significant for dementia, hypertension, hyperlipidemia, type II DM, PAD, CAD, stroke with left hemiparesis, history of left foot wounds, hospitalized from 11/06/2023 through 11/11/2023 for critical left lower extremity ischemia s/p angioplasty of left lower extremity on 11/06/2023.  She was referred from the wound care clinic to the ED for further evaluation of left foot wound because of concern for gangrene/abscess.    She was found to have osteomyelitis of the left foot.      Assessment/Plan:   Principal Problem:   Acute osteomyelitis of toe, left (HCC) Active Problems:   Type 2 diabetes mellitus with polyneuropathy (HCC)   Mixed hyperlipidemia   Essential hypertension   Atherosclerosis of native arteries of the extremities with ulceration (HCC)   Gangrene of toe (HCC)   Body mass index is 18.09 kg/m.  Left foot osteomyelitis, leukocytosis: S/p left above-the-knee amputation on 12/09/2023.  S/p 3 days of IV Zosyn  and vancomycin  Follow-up with vascular surgeon for wound care   Acute postoperative blood loss anemia, anemia of chronic disease: Hemoglobin down to 7.  Discussed risks and benefits of blood transfusion with Ms. Alberta Rolling (legal guardian with DSS) over the phone.  She is agreeable to blood transfusion.  Transfuse 1 unit of PRBCs because of underlying CAD.  Hemoglobin was 10 on admission.   Hypertension: Continue lisinopril    Mild hypoglycemia: Improved.   Type II DM with hyperglycemia: NovoLog  as needed for hyperglycemia.  Recent hemoglobin A1c on 11/07/2023 was 5.9. Hemoglobin A1c from 08/08/2022 was 8.1.     CAD, PVD s/p left lower extremity angioplasty on 11/06/2023, history of stroke: Continue aspirin   and Lipitor    History of stroke with left hemiparesis.  Patient is nonambulatory at baseline.   Comorbidities include hyperlipidemia, dementia, chronic anemia     Diet Order             Diet Carb Modified Fluid consistency: Thin; Room service appropriate? Yes with Assist  Diet effective now                                  Consultants: Podiatrist Vascular surgeon  Procedures: S/p left above-the-knee amputation on 12/09/2023    Medications:    (feeding supplement) PROSource Plus  30 mL Oral TID BM   sodium chloride    Intravenous Once   vitamin C   500 mg Oral BID   aspirin  EC  81 mg Oral Daily   atorvastatin   80 mg Oral Daily   cyanocobalamin   1,000 mcg Oral Daily   feeding supplement  237 mL Oral BID BM   heparin   5,000 Units Subcutaneous Q8H   hydrocerin   Topical BID   insulin  aspart  0-5 Units Subcutaneous QHS   insulin  aspart  0-9 Units Subcutaneous TID WC   lisinopril   20 mg Oral Daily   multivitamin with minerals  1 tablet Oral Daily   zinc sulfate (50mg  elemental zinc)  220 mg Oral Daily   Continuous Infusions:     Anti-infectives (From admission, onward)    Start     Dose/Rate Route Frequency Ordered Stop   12/09/23 0659  ceFAZolin  (ANCEF ) IVPB 2g/100  mL premix        2 g 200 mL/hr over 30 Minutes Intravenous 30 min pre-op 12/09/23 0700 12/09/23 0759   12/05/23 1300  vancomycin  (VANCOREADY) IVPB 750 mg/150 mL  Status:  Discontinued        750 mg 150 mL/hr over 60 Minutes Intravenous Every 24 hours 12/04/23 2108 12/09/23 1238   12/04/23 2100  piperacillin -tazobactam (ZOSYN ) IVPB 3.375 g  Status:  Discontinued        3.375 g 12.5 mL/hr over 240 Minutes Intravenous Every 8 hours 12/04/23 2028 12/09/23 1238   12/04/23 1200  piperacillin -tazobactam (ZOSYN ) IVPB 3.375 g        3.375 g 100 mL/hr over 30 Minutes Intravenous  Once 12/04/23 1148 12/04/23 1255   12/04/23 1200  vancomycin  (VANCOREADY) IVPB 1500 mg/300 mL        1,500  mg 150 mL/hr over 120 Minutes Intravenous  Once 12/04/23 1151 12/04/23 1504              Family Communication/Anticipated D/C date and plan/Code Status   DVT prophylaxis: heparin  injection 5,000 Units Start: 12/04/23 2200 SCDs Start: 12/04/23 1403     Code Status: Limited: Do not attempt resuscitation (DNR) -DNR-LIMITED -Do Not Intubate/DNI   Family Communication: Plan discussed with Debby, son at the bedside.  Plan discussed with Candace, legal guardian with DSS, over the phone. Disposition Plan: Plan to discharge to SNF   Status is: Inpatient Remains inpatient appropriate because: Left foot osteomyelitis       Subjective:   Total events noted.  No complaints.  Debby, son, at the bedside  Objective:    Vitals:   12/09/23 2048 12/10/23 0500 12/10/23 0534 12/10/23 0849  BP: 139/60  (!) 137/50 (!) 141/61  Pulse: 76  86 81  Resp: 16  16 19   Temp: 97.6 F (36.4 C)  98.4 F (36.9 C) 98.7 F (37.1 C)  TempSrc: Axillary  Oral Oral  SpO2: 98%  98% 99%  Weight:  47.8 kg    Height:       No data found.   Intake/Output Summary (Last 24 hours) at 12/10/2023 1052 Last data filed at 12/09/2023 1900 Gross per 24 hour  Intake 180 ml  Output --  Net 180 ml   Filed Weights   12/04/23 2101 12/10/23 0500  Weight: 54.9 kg 47.8 kg    Exam:   GEN: NAD SKIN: Warm and dry EYES: Pale, anicteric ENT: MMM CV: RRR PULM: CTA B ABD: soft, ND, NT, +BS CNS: AAO x 1 (person), left hemiparesis EXT: Dressing on left AKA stump is clean, dry and intact     Data Reviewed:   I have personally reviewed following labs and imaging studies:  Labs: Labs show the following:   Basic Metabolic Panel: Recent Labs  Lab 12/04/23 1211 12/05/23 0516 12/06/23 0338  NA 143 143 142  K 4.1 3.4* 4.0  CL 102 103 108  CO2 27 25 23   GLUCOSE 192* 117* 69*  BUN 19 19 18   CREATININE 0.66 0.56 0.55  CALCIUM  9.1 8.5* 8.1*  MG  --   --  2.1  PHOS  --   --  3.4    GFR Estimated Creatinine Clearance: 37.4 mL/min (by C-G formula based on SCr of 0.55 mg/dL). Liver Function Tests: Recent Labs  Lab 12/04/23 1211 12/06/23 0338  AST 21  --   ALT 10  --   ALKPHOS 85  --   BILITOT 0.7  --   PROT  8.6*  --   ALBUMIN 2.9* 2.1*   No results for input(s): LIPASE, AMYLASE in the last 168 hours. No results for input(s): AMMONIA in the last 168 hours. Coagulation profile No results for input(s): INR, PROTIME in the last 168 hours.  CBC: Recent Labs  Lab 12/04/23 1211 12/05/23 0516 12/06/23 0338 12/10/23 0413  WBC 16.8* 16.6* 14.5* 18.0*  NEUTROABS 14.1*  --   --   --   HGB 10.0* 8.3* 7.8* 7.0*  HCT 33.6* 27.0* 26.3* 23.2*  MCV 94.6 92.2 95.3 93.5  PLT 484* 434* 388 442*   Cardiac Enzymes: No results for input(s): CKTOTAL, CKMB, CKMBINDEX, TROPONINI in the last 168 hours. BNP (last 3 results) No results for input(s): PROBNP in the last 8760 hours. CBG: Recent Labs  Lab 12/09/23 0856 12/09/23 1302 12/09/23 1738 12/09/23 2043 12/10/23 0850  GLUCAP 167* 206* 250* 285* 175*   D-Dimer: No results for input(s): DDIMER in the last 72 hours. Hgb A1c: No results for input(s): HGBA1C in the last 72 hours. Lipid Profile: No results for input(s): CHOL, HDL, LDLCALC, TRIG, CHOLHDL, LDLDIRECT in the last 72 hours. Thyroid  function studies: No results for input(s): TSH, T4TOTAL, T3FREE, THYROIDAB in the last 72 hours.  Invalid input(s): FREET3 Anemia work up: No results for input(s): VITAMINB12, FOLATE, FERRITIN, TIBC, IRON, RETICCTPCT in the last 72 hours. Sepsis Labs: Recent Labs  Lab 12/04/23 1211 12/05/23 0516 12/06/23 0338 12/10/23 0413  WBC 16.8* 16.6* 14.5* 18.0*  LATICACIDVEN 1.6  --   --   --     Microbiology Recent Results (from the past 240 hours)  Aerobic/Anaerobic Culture w Gram Stain (surgical/deep wound)     Status: None (Preliminary result)   Collection Time:  12/04/23  7:54 PM   Specimen: Foot; Wound  Result Value Ref Range Status   Specimen Description   Final    FOOT Performed at [facility], [address] [place], [place] [address]    Special Requests   Final    Immunocompromised Performed at [facility], [address] [place], [place] [address]    Gram Stain   Final    FEW WBC PRESENT,BOTH PMN AND MONONUCLEAR FEW GRAM POSITIVE COCCI Performed at [facility], [address], [place], [place] [address]    Culture   Final    MODERATE METHICILLIN RESISTANT STAPHYLOCOCCUS AUREUS NO ANAEROBES ISOLATED; CULTURE IN PROGRESS FOR 5 DAYS    Report Status PENDING  Incomplete   Organism ID, Bacteria METHICILLIN RESISTANT STAPHYLOCOCCUS AUREUS  Final      Susceptibility   Methicillin resistant staphylococcus aureus - MIC*    CIPROFLOXACIN >=8 RESISTANT Resistant     ERYTHROMYCIN >=8 RESISTANT Resistant     GENTAMICIN <=0.5 SENSITIVE Sensitive     OXACILLIN >=4 RESISTANT Resistant     TETRACYCLINE <=1 SENSITIVE Sensitive     VANCOMYCIN  1 SENSITIVE Sensitive     TRIMETH/SULFA <=10 SENSITIVE Sensitive     CLINDAMYCIN <=0.25 SENSITIVE Sensitive     RIFAMPIN <=0.5 SENSITIVE Sensitive     Inducible Clindamycin NEGATIVE Sensitive     LINEZOLID 2 SENSITIVE Sensitive     * MODERATE METHICILLIN RESISTANT STAPHYLOCOCCUS AUREUS  Blood culture (routine x 2)     Status: None   Collection Time: 12/04/23  8:06 PM   Specimen: BLOOD  Result Value Ref Range Status   Specimen Description BLOOD BLOOD RIGHT FOREARM  Final   Special Requests   Final    BOTTLES DRAWN AEROBIC  AND ANAEROBIC Blood Culture results may not be optimal due to an inadequate volume of blood received in culture bottles   Culture   Final    NO GROWTH 5 DAYS Performed at [facility], [address] [place], [place] [address]    Report Status 12/09/2023 FINAL  Final  Blood culture (routine x 2)     Status: None   Collection Time: 12/04/23   8:07 PM   Specimen: BLOOD  Result Value Ref Range Status   Specimen Description BLOOD BLOOD LEFT FOREARM  Final   Special Requests   Final    BOTTLES DRAWN AEROBIC AND ANAEROBIC Blood Culture results may not be optimal due to an inadequate volume of blood received in culture bottles   Culture   Final    NO GROWTH 5 DAYS Performed at [facility], [address] [place], [place] [address]    Report Status 12/09/2023 FINAL  Final    Procedures and diagnostic studies:  No results found.              LOS: 6 days   Nyzier Boivin  [facility]   Pager on [email or web address]. If 7PM-7AM, please contact night-coverage at [email or web address]     12/10/2023, 10:52 AM

## 2023-12-10 NOTE — TOC Progression Note (Signed)
 Transition of Care [facility]) - Progression Note    Patient Details  Name: [name] MRN: [id number] Date of Birth: 1933/04/04  [facility] of [facility]) CM/SW Contact  Corean ONEIDA Haddock, RN Phone Number: 12/10/2023, 1:57 PM  Clinical Narrative:     Received return call from [facility] DSS guardian [phone] She request alternative LTC placement rather then patient return to [facility]. I notified Lewanna that MD anticipates patient will medically be ready for DC on Saturday. I let her know a bed search would be completed but that if no other bed offers were found patient would have to return to [facility], and work with their SW to purse alternative placement.  She is in agreement  Extensive bed search completed    Expected Discharge Plan: Long Term Acute Care (LTAC) Barriers to Discharge: Continued Medical Work up    DSS Service county: [facility]          Expected Discharge Plan and Services       Living arrangements for the past 2 months: Skilled Nursing Facility                                       Social Drivers of Health (SDOH) Interventions SDOH Screenings   Food Insecurity: Patient Unable To Answer (12/05/2023)  Housing: Unknown (12/05/2023)  Transportation Needs: Patient Unable To Answer (12/05/2023)  Utilities: Patient Unable To Answer (12/05/2023)  Social Connections: Patient Unable To Answer (12/05/2023)  Tobacco Use: Low Risk  (12/09/2023)    Readmission Risk Interventions    05/01/2023    4:26 PM  Readmission Risk Prevention Plan  Transportation Screening Complete  HRI or Home Care Consult Complete  Social Work Consult for Recovery Care Planning/Counseling Complete  Medication Review Oceanographer) Complete

## 2023-12-10 NOTE — Progress Notes (Signed)
 Progress Note    12/10/2023 1:10 PM 1 Day Post-Op  Subjective:   [name] is an 89 yo female who is well-known to vein and vascular services.  Back on 11/06/2023 patient underwent the following procedure.   Procedure(s) Performed:             1.  Introduction catheter into left lower extremity 3rd order catheter placement               2.    Contrast injection left lower extremity for distal runoff             3.  Percutaneous transluminal angioplasty and stent placement left popliteal artery to 4 mm             4.  Percutaneous transluminal angioplasty and stent placement to the peroneal artery to 3.75 mm             5.  Mechanical thrombectomy of the popliteal artery occlusion and in-stent restenosis with the penumbra CAT 6 bolt catheter.               6.  Celt closure right common femoral arteriotomy   Post this angiogram procedure patient underwent transmetatarsal amputation of the first ray of the fifth ray.  Since that time her left lower extremity has deteriorated.   Patient is now POD #1 from:  PROCEDURE:12/09/2023  Left above-the-knee amputation  Patient has a long history of severe dementia.  She appears to be resting comfortably in bed today with no complications to note.  Recovering as expected.  Vitals all remained stable.   Vitals:   12/10/23 1248 12/10/23 1257  BP: (!) 135/51 (!) 135/51  Pulse: 80 80  Resp: 18 18  Temp: 98.8 F (37.1 C) 98.8 F (37.1 C)  SpO2: 100%    Physical Exam: Cardiac:  RRR, normal S1 and S2.  No murmurs appreciated. Lungs: Lungs clear throughout on auscultation, nonlabored breathing, no rales rhonchi or wheezing noted but diminished in the bases. Incisions: Left Lower Extremity AKA with bandage in place.  No drainage noted. Extremities: Prior first toe and fifth toe amputation to her left lower extremity.  Left third toe osteomyelitis with open wound draining.  Left heel ulcer. Abdomen: Positive bowel sounds throughout, soft,  nontender nondistended. Neurologic: Severe dementia.  Unable to answer questions or follow commands.  CBC    Component Value Date/Time   WBC 18.0 (H) 12/10/2023 0413   RBC 2.48 (L) 12/10/2023 0413   HGB 7.0 (L) 12/10/2023 0413   HGB 8.6 (L) 09/25/2013 0410   HCT 23.2 (L) 12/10/2023 0413   HCT 27.0 (L) 09/24/2013 0409   PLT 442 (H) 12/10/2023 0413   PLT 192 09/24/2013 0409   MCV 93.5 12/10/2023 0413   MCV 96 09/24/2013 0409   MCH 28.2 12/10/2023 0413   MCHC 30.2 12/10/2023 0413   RDW 16.3 (H) 12/10/2023 0413   RDW 13.8 09/24/2013 0409   LYMPHSABS 1.7 12/04/2023 1211   LYMPHSABS 2.5 09/24/2013 0409   MONOABS 0.7 12/04/2023 1211   MONOABS 0.7 09/24/2013 0409   EOSABS 0.2 12/04/2023 1211   EOSABS 0.4 09/24/2013 0409   BASOSABS 0.0 12/04/2023 1211   BASOSABS 0.0 09/24/2013 0409    BMET    Component Value Date/Time   NA 142 12/06/2023 0338   NA 143 09/24/2013 0409   K 4.0 12/06/2023 0338   K 3.6 09/24/2013 0409   CL 108 12/06/2023 0338   CL 108 (H) 09/24/2013 0409  CO2 23 12/06/2023 0338   CO2 27 09/24/2013 0409   GLUCOSE 69 (L) 12/06/2023 0338   GLUCOSE 222 (H) 09/24/2013 0409   BUN 18 12/06/2023 0338   BUN 7 09/24/2013 0409   CREATININE 0.55 12/06/2023 0338   CREATININE 0.77 09/24/2013 0409   CALCIUM  8.1 (L) 12/06/2023 0338   CALCIUM  7.7 (L) 09/24/2013 0409   GFRNONAA >60 12/06/2023 0338   GFRNONAA >60 09/24/2013 0409   GFRAA >60 09/26/2019 1952   GFRAA >60 09/24/2013 0409    INR    Component Value Date/Time   INR 1.1 11/06/2023 1126     Intake/Output Summary (Last 24 hours) at 12/10/2023 1310 Last data filed at 12/09/2023 1900 Gross per 24 hour  Intake 180 ml  Output --  Net 180 ml     Assessment/Plan:  89 y.o. female is s/p left lower extremity above-the-knee amputation.  1 Day Post-Op   PLAN Patient to have left lower extremity AKA dressing changed on Saturday.  Patient will then will be okay if no complications noted to be discharged to SNF  as planned.  Patient is currently under guidance of [place] Department of social services.  They are aware of the patient's situation.  DVT prophylaxis: Heparin  5000 units subcu every 8 hours   [name] [facility] 12/10/2023 1:10 PM

## 2023-12-10 NOTE — Plan of Care (Signed)

## 2023-12-11 ENCOUNTER — Encounter: Admitting: Physician Assistant

## 2023-12-11 DIAGNOSIS — I7025 Atherosclerosis of native arteries of other extremities with ulceration: Secondary | ICD-10-CM | POA: Diagnosis not present

## 2023-12-11 DIAGNOSIS — Z89612 Acquired absence of left leg above knee: Secondary | ICD-10-CM | POA: Diagnosis not present

## 2023-12-11 DIAGNOSIS — I10 Essential (primary) hypertension: Secondary | ICD-10-CM | POA: Diagnosis not present

## 2023-12-11 DIAGNOSIS — E782 Mixed hyperlipidemia: Secondary | ICD-10-CM | POA: Diagnosis not present

## 2023-12-11 DIAGNOSIS — E1142 Type 2 diabetes mellitus with diabetic polyneuropathy: Secondary | ICD-10-CM

## 2023-12-11 LAB — GLUCOSE, CAPILLARY
Glucose-Capillary: 159 mg/dL — ABNORMAL HIGH (ref 70–99)
Glucose-Capillary: 233 mg/dL — ABNORMAL HIGH (ref 70–99)
Glucose-Capillary: 242 mg/dL — ABNORMAL HIGH (ref 70–99)
Glucose-Capillary: 179 mg/dL — ABNORMAL HIGH (ref 70–99)

## 2023-12-11 LAB — CBC
HCT: 25.8 % — ABNORMAL LOW (ref 36.0–46.0)
Hemoglobin: 8.3 g/dL — ABNORMAL LOW (ref 12.0–15.0)
MCH: 28.7 pg (ref 26.0–34.0)
MCHC: 32.2 g/dL (ref 30.0–36.0)
MCV: 89.3 fL (ref 80.0–100.0)
Platelets: 362 K/uL (ref 150–400)
RBC: 2.89 MIL/uL — ABNORMAL LOW (ref 3.87–5.11)
RDW: 16.4 % — ABNORMAL HIGH (ref 11.5–15.5)
WBC: 14.5 K/uL — ABNORMAL HIGH (ref 4.0–10.5)
nRBC: 0 % (ref 0.0–0.2)

## 2023-12-11 LAB — TYPE AND SCREEN
ABO/RH(D): A POS
Antibody Screen: NEGATIVE
Unit division: 0

## 2023-12-11 LAB — BPAM RBC
Blood Product Expiration Date: 202511272359
ISSUE DATE / TIME: 202510301249
Unit Type and Rh: 6200

## 2023-12-11 MED ORDER — MAGNESIUM OXIDE -MG SUPPLEMENT 400 (240 MG) MG PO TABS
400.0000 mg | ORAL_TABLET | Freq: Every day | ORAL | Status: DC
Start: 2023-12-11 — End: 2023-12-13
  Administered 2023-12-11 – 2023-12-12 (×2): 400 mg via ORAL
  Filled 2023-12-11 (×2): qty 1

## 2023-12-11 MED ORDER — FERROUS SULFATE 220 (44 FE) MG/5ML PO SOLN
220.0000 mg | ORAL | Status: DC
  Administered 2023-12-11: 220 mg via ORAL
  Filled 2023-12-11 (×2): qty 5

## 2023-12-11 MED ORDER — VITAMIN D 25 MCG (1000 UNIT) PO TABS: 5000.0000 [IU] | ORAL_TABLET | ORAL | Status: DC

## 2023-12-11 MED ORDER — BISACODYL 10 MG RE SUPP
10.0000 mg | Freq: Every day | RECTAL | Status: DC | PRN
Start: 2023-12-11 — End: 2023-12-13
  Administered 2023-12-12: 10 mg via RECTAL
  Filled 2023-12-11: qty 1

## 2023-12-11 MED ORDER — CLOPIDOGREL BISULFATE 75 MG PO TABS
75.0000 mg | ORAL_TABLET | Freq: Every day | ORAL | Status: DC
  Administered 2023-12-11 – 2023-12-12 (×2): 75 mg via ORAL
  Filled 2023-12-11 (×2): qty 1

## 2023-12-11 NOTE — Plan of Care (Signed)

## 2023-12-11 NOTE — Progress Notes (Signed)
 [facility]  - [place] [facility]   PATIENT NAME: [name]    MR#:  [id number]  DATE OF BIRTH:  [birth year]  SUBJECTIVE:  dementia at baseline. No issues per RN. Tolerating PO diet. No family at bedside.    VITALS:  Blood pressure (!) 162/65, pulse 82, temperature 98.6 F (37 C), resp. rate 16, height 5' 4 (1.626 m), weight 47.8 kg, SpO2 100%.  PHYSICAL EXAMINATION:   GENERAL:  89 y.o.-year-old patient with no acute distress. Frail cachectic LUNGS: Normal breath sounds bilaterally, no wheezing CARDIOVASCULAR: S1, S2 normal. No murmur   ABDOMEN: Soft, nontender, nondistended. Bowel sounds present.  EXTREMITIES: left below knee amputation NEUROLOGIC: unable to assessment details due to mental status and patient not following instructions due to dementia SKIN: per RN  LABORATORY PANEL:  CBC Recent Labs  Lab 12/11/23 0527  WBC 14.5*  HGB 8.3*  HCT 25.8*  PLT 362    Chemistries  Recent Labs  Lab 12/06/23 0338  NA 142  K 4.0  CL 108  CO2 23  GLUCOSE 69*  BUN 18  CREATININE 0.55  CALCIUM  8.1*  MG 2.1    Assessment and Plan [name] is a 89 y.o. female with medical history significant for dementia, hypertension, hyperlipidemia, type II DM, PAD, CAD, stroke with left hemiparesis, history of left foot wounds, hospitalized from 11/06/2023 through 11/11/2023 for critical left lower extremity ischemia s/p angioplasty of left lower extremity on 11/06/2023.  She was referred from the wound care clinic to the ED for further evaluation of left foot wound because of concern for gangrene/abscess.     She was found to have osteomyelitis of the left foot.  Left foot osteomyelitis, leukocytosis: S/p left above-the-knee amputation on 12/09/2023.  -- S/p 3 days of IV Zosyn  and vancomycin --now d/ced --Follow-up with vascular surgeon for wound care--plans to changed post-op dressing on Saturday   Acute postoperative blood loss anemia, anemia of  chronic disease: -- Hemoglobin down to 7.   --Discussed risks and benefits of blood transfusion with Ms. Alberta Rolling (legal guardian with DSS) over the phone.  She is agreeable to blood transfusion.  Transfuse 1 unit of PRBCs because of underlying CAD.  Hemoglobin was 10 on admission. --hgb stable at 8.3    Hypertension: Continue lisinopril    Type II DM with hyperglycemia: NovoLog  as needed for hyperglycemia.  Recent hemoglobin A1c on 11/07/2023 was 5.9. Hemoglobin A1c from 08/08/2022 was 8.1.     CAD, PVD s/p left lower extremity angioplasty on 11/06/2023, history of stroke: --Continue aspirin  and Lipitor    History of stroke with left hemiparesis.  Patient is nonambulatory at baseline.   Co-morbidities include hyperlipidemia, dementia, chronic anemia   Procedures: Left BKA Family communication : DSS Legal guardian Candace Goble Consults :Vascular sx CODE STATUS:  DVT Prophylaxis : Level of care: Med-Surg Status is: Inpatient Remains inpatient appropriate because: vascular surgery plans to change postop dressing tomorrow. If remains stable she will go back to facility tomorrow this was related to DSS legal guardian    TOTAL TIME TAKING CARE OF THIS PATIENT: 35 minutes.  >50% time spent on counselling and coordination of care  Note: This dictation was prepared with Dragon dictation along with smaller phrase technology. Any transcriptional errors that result from this process are unintentional.  Leita Blanch M.D    [facility]   CC: Primary care physician; Rudolpho Norleen BIRCH, MD

## 2023-12-11 NOTE — TOC Progression Note (Addendum)
 Transition of Care [facility]) - Progression Note    Patient Details  Name: [name] MRN: [id number] Date of Birth: [birth year]  Transition [facility]) CM/SW Contact  Marinda Cooks, RN Phone Number: 12/11/2023, 9:21 AM  Clinical Narrative:    This CM called and spoke with pt's assigned DSS Legal Guardian Rubie Moats for [facility] and discussed in great detail pt's dc plan and confirmed pt will return tp [facility] as a LTC resident . This CM confirmed with medial team pt is not medically cleared to dc today. This CM informed pt must have next wound care assessment to new Left  AKA on Sat and if medically cleared can dc to [facility] at that time. This CM updated admission laision [name] . TOC will cont to follow dc planning / care coordination and update as applicable.    Expected Discharge Plan: Long Term Acute Care (LTAC) Barriers to Discharge: Continued Medical Work up    DSS Service county: [facility]          Expected Discharge Plan and Services       Living arrangements for the past 2 months: Skilled Nursing Facility                                       Social Drivers of Health (SDOH) Interventions SDOH Screenings   Food Insecurity: Patient Unable To Answer (12/05/2023)  Housing: Unknown (12/05/2023)  Transportation Needs: Patient Unable To Answer (12/05/2023)  Utilities: Patient Unable To Answer (12/05/2023)  Social Connections: Patient Unable To Answer (12/05/2023)  Tobacco Use: Low Risk  (12/09/2023)    Readmission Risk Interventions    05/01/2023    4:26 PM  Readmission Risk Prevention Plan  Transportation Screening Complete  HRI or Home Care Consult Complete  Social Work Consult for Recovery Care Planning/Counseling Complete  Medication Review Oceanographer) Complete

## 2023-12-11 NOTE — Progress Notes (Signed)
 Progress Note    12/11/2023 10:20 AM 2 Days Post-Op  Subjective:   [name] is an 89 yo female who is well-known to vein and vascular services.  Back on 11/06/2023 patient underwent the following procedure.   Procedure(s) Performed:             1.  Introduction catheter into left lower extremity 3rd order catheter placement               2.    Contrast injection left lower extremity for distal runoff             3.  Percutaneous transluminal angioplasty and stent placement left popliteal artery to 4 mm             4.  Percutaneous transluminal angioplasty and stent placement to the peroneal artery to 3.75 mm             5.  Mechanical thrombectomy of the popliteal artery occlusion and in-stent restenosis with the penumbra CAT 6 bolt catheter.               6.  Celt closure right common femoral arteriotomy   Post this angiogram procedure patient underwent transmetatarsal amputation of the first ray of the fifth ray.  Since that time her left lower extremity has deteriorated.    Patient is now POD #2 from:   PROCEDURE:12/09/2023  Left above-the-knee amputation   Patient has a long history of severe dementia.  She appears to be resting comfortably in bed today with no complications to note.  Recovering as expected.  Vitals all remained stable.   Vitals:   12/11/23 0501 12/11/23 0835  BP: (!) 167/61 (!) 162/65  Pulse: 83 82  Resp: 16 16  Temp: (!) 100.5 F (38.1 C) 98.6 F (37 C)  SpO2: 100% 100%   Physical Exam: Cardiac:  RRR, normal S1 and S2.  No murmurs appreciated. Lungs: Lungs clear throughout on auscultation, nonlabored breathing, no rales rhonchi or wheezing noted but diminished in the bases. Incisions: Left Lower Extremity AKA with bandage in place.  No drainage noted. Extremities: Prior first toe and fifth toe amputation to her left lower extremity.  Left third toe osteomyelitis with open wound draining.  Left heel ulcer. Abdomen: Positive bowel sounds  throughout, soft, nontender nondistended. Neurologic: Severe dementia.  Unable to answer questions or follow commands.    CBC    Component Value Date/Time   WBC 14.5 (H) 12/11/2023 0527   RBC 2.89 (L) 12/11/2023 0527   HGB 8.3 (L) 12/11/2023 0527   HGB 8.6 (L) 09/25/2013 0410   HCT 25.8 (L) 12/11/2023 0527   HCT 27.0 (L) 09/24/2013 0409   PLT 362 12/11/2023 0527   PLT 192 09/24/2013 0409   MCV 89.3 12/11/2023 0527   MCV 96 09/24/2013 0409   MCH 28.7 12/11/2023 0527   MCHC 32.2 12/11/2023 0527   RDW 16.4 (H) 12/11/2023 0527   RDW 13.8 09/24/2013 0409   LYMPHSABS 1.7 12/04/2023 1211   LYMPHSABS 2.5 09/24/2013 0409   MONOABS 0.7 12/04/2023 1211   MONOABS 0.7 09/24/2013 0409   EOSABS 0.2 12/04/2023 1211   EOSABS 0.4 09/24/2013 0409   BASOSABS 0.0 12/04/2023 1211   BASOSABS 0.0 09/24/2013 0409    BMET    Component Value Date/Time   NA 142 12/06/2023 0338   NA 143 09/24/2013 0409   K 4.0 12/06/2023 0338   K 3.6 09/24/2013 0409   CL 108 12/06/2023 0338   CL  108 (H) 09/24/2013 0409   CO2 23 12/06/2023 0338   CO2 27 09/24/2013 0409   GLUCOSE 69 (L) 12/06/2023 0338   GLUCOSE 222 (H) 09/24/2013 0409   BUN 18 12/06/2023 0338   BUN 7 09/24/2013 0409   CREATININE 0.55 12/06/2023 0338   CREATININE 0.77 09/24/2013 0409   CALCIUM  8.1 (L) 12/06/2023 0338   CALCIUM  7.7 (L) 09/24/2013 0409   GFRNONAA >60 12/06/2023 0338   GFRNONAA >60 09/24/2013 0409   GFRAA >60 09/26/2019 1952   GFRAA >60 09/24/2013 0409    INR    Component Value Date/Time   INR 1.1 11/06/2023 1126     Intake/Output Summary (Last 24 hours) at 12/11/2023 1020 Last data filed at 12/11/2023 0900 Gross per 24 hour  Intake 596 ml  Output 500 ml  Net 96 ml     Assessment/Plan:  89 y.o. female is s/p  left lower extremity above-the-knee amputation  2 Days Post-Op   PLAN Patient to have left lower extremity AKA dressing changed on Saturday.  Patient will then will be okay if no complications noted to  be discharged to SNF as planned.  Patient is currently under guidance of [place] Department of social services.  They are aware of the patient's situation.   DVT prophylaxis: Heparin  5000 units subcu every 8 hours   [name] [facility] 12/11/2023 10:20 AM

## 2023-12-12 DIAGNOSIS — I10 Essential (primary) hypertension: Secondary | ICD-10-CM | POA: Diagnosis not present

## 2023-12-12 DIAGNOSIS — M86172 Other acute osteomyelitis, left ankle and foot: Secondary | ICD-10-CM | POA: Diagnosis not present

## 2023-12-12 DIAGNOSIS — I7025 Atherosclerosis of native arteries of other extremities with ulceration: Secondary | ICD-10-CM | POA: Diagnosis not present

## 2023-12-12 DIAGNOSIS — E1142 Type 2 diabetes mellitus with diabetic polyneuropathy: Secondary | ICD-10-CM | POA: Diagnosis not present

## 2023-12-12 LAB — GLUCOSE, CAPILLARY
Glucose-Capillary: 155 mg/dL — ABNORMAL HIGH (ref 70–99)
Glucose-Capillary: 221 mg/dL — ABNORMAL HIGH (ref 70–99)
Glucose-Capillary: 336 mg/dL — ABNORMAL HIGH (ref 70–99)
Glucose-Capillary: 225 mg/dL — ABNORMAL HIGH (ref 70–99)

## 2023-12-12 MED ORDER — HYDRALAZINE HCL 25 MG PO TABS: 25.0000 mg | ORAL_TABLET | Freq: Three times a day (TID) | ORAL | 1 refills | Status: DC

## 2023-12-12 MED ORDER — OXYCODONE HCL 5 MG PO TABS: 5.0000 mg | ORAL_TABLET | Freq: Three times a day (TID) | ORAL | 0 refills | Status: DC | PRN

## 2023-12-12 MED ORDER — HYDRALAZINE HCL 25 MG PO TABS
25.0000 mg | ORAL_TABLET | Freq: Three times a day (TID) | ORAL | Status: DC
  Administered 2023-12-12 (×2): 25 mg via ORAL
  Filled 2023-12-12 (×2): qty 1

## 2023-12-12 NOTE — TOC Transition Note (Signed)
 Transition of Care [facility]) - Discharge Note   Patient Details  Name: [name] MRN: [id number] Date of Birth: February 27, 1933  [facility] CM/SW Contact:  Marinda Cooks, RN Phone Number: 12/12/2023, 3:59 PM   Clinical Narrative:    This CM updated by covering MD pt medically cleared to dc today and has active DC order . This CM spoke with  Admission liaison [name] @ [facility] and confirmed LTC  Bed . This CM also spoke with on call Legal Guardian Scott and confirmed pt was cleared to return to  [facility].  DC transportation confirmed for pt with [facility]  Medical team updated . No additional DC needs requested by medical team or identified by CM at this time .     Final next level of care: Skilled Nursing Facility Barriers to Discharge: No Barriers Identified    Discharge Placement  SNF            Patient chooses bed at: [facility] [place]   Name of family member notified: Legal Guardian on call for the weekend Scott with [facility] DSS Patient and family notified of of transfer: 12/12/23  Discharge Plan and Services Additional resources added to the After Visit Summary for                                       Social Drivers of Health (SDOH) Interventions SDOH Screenings   Food Insecurity: Patient Unable To Answer (12/05/2023)  Housing: Unknown (12/05/2023)  Transportation Needs: Patient Unable To Answer (12/05/2023)  Utilities: Patient Unable To Answer (12/05/2023)  Social Connections: Patient Unable To Answer (12/05/2023)  Tobacco Use: Low Risk  (12/09/2023)     Readmission Risk Interventions    05/01/2023    4:26 PM  Readmission Risk Prevention Plan  Transportation Screening Complete  HRI or Home Care Consult Complete  Social Work Consult for Recovery Care Planning/Counseling Complete  Medication Review Oceanographer) Complete

## 2023-12-12 NOTE — Progress Notes (Signed)
 [facility]  Daily Progress Note   Subjective  -   No complaints.  Feels well.  Mild discomfort and some phantom pain reported.  Objective Vitals:   12/11/23 2121 12/12/23 0418 12/12/23 0755 12/12/23 0755  BP: (!) 142/71 (!) 152/78 (!) 181/74   Pulse: 77 74 68 67  Resp: 16 17 20    Temp: 98.8 F (37.1 C) 98.8 F (37.1 C) 98.6 F (37 C)   TempSrc:   Oral   SpO2: 100% 100% 96% 100%  Weight:      Height:        Intake/Output Summary (Last 24 hours) at 12/12/2023 1140 Last data filed at 12/12/2023 0900 Gross per 24 hour  Intake 360 ml  Output --  Net 360 ml    PULM  CTAB CV  RRR VASC  Left AKA dressing removed.  Some dried blood at staple line removed.  Staples intact.  Skin edges viable.  Chloroprep dressed and Mepilex placed followed by Ace wrap compression.  Laboratory CBC    Component Value Date/Time   WBC 14.5 (H) 12/11/2023 0527   HGB 8.3 (L) 12/11/2023 0527   HGB 8.6 (L) 09/25/2013 0410   HCT 25.8 (L) 12/11/2023 0527   HCT 27.0 (L) 09/24/2013 0409   PLT 362 12/11/2023 0527   PLT 192 09/24/2013 0409    BMET    Component Value Date/Time   NA 142 12/06/2023 0338   NA 143 09/24/2013 0409   K 4.0 12/06/2023 0338   K 3.6 09/24/2013 0409   CL 108 12/06/2023 0338   CL 108 (H) 09/24/2013 0409   CO2 23 12/06/2023 0338   CO2 27 09/24/2013 0409   GLUCOSE 69 (L) 12/06/2023 0338   GLUCOSE 222 (H) 09/24/2013 0409   BUN 18 12/06/2023 0338   BUN 7 09/24/2013 0409   CREATININE 0.55 12/06/2023 0338   CREATININE 0.77 09/24/2013 0409   CALCIUM  8.1 (L) 12/06/2023 0338   CALCIUM  7.7 (L) 09/24/2013 0409   GFRNONAA >60 12/06/2023 0338   GFRNONAA >60 09/24/2013 0409   GFRAA >60 09/26/2019 1952   GFRAA >60 09/24/2013 0409    Assessment/Planning: POD #4 s/p L AKA  Change Mepilex if soaked but can wait 4 days with maximum of 10.  Keep light Ace wrap on.  Office f/u in 2 weeks.  Discussed directly with Dr. Tobie.  Plan is discharge to facility  today.   [name]  12/12/2023, 11:40 AM

## 2023-12-12 NOTE — Discharge Instructions (Signed)
 Per vascular surgery ---Change Mepilex if soaked but can wait 4 days with maximum of 10. Place dry guaze wrap with bandage and Keep light Ace wrap on.

## 2023-12-12 NOTE — Discharge Summary (Signed)
 Physician Discharge Summary   Patient: [name] MRN: [id number] DOB: [birth year]  Admit date:     12/04/2023  Discharge date: 12/12/23  Discharge Physician: Leita Blanch   PCP: Rudolpho Norleen BIRCH, MD   Recommendations at discharge:    F/u Dr Marea in 2 weeks post amputation wound check F/u PCP in 1-2 weeks  Discharge Diagnoses: Principal Problem:   Acute osteomyelitis of toe, left (HCC) Active Problems:   Type 2 diabetes mellitus with polyneuropathy (HCC)   Mixed hyperlipidemia   Essential hypertension   Atherosclerosis of native arteries of the extremities with ulceration (HCC)   Gangrene of toe (HCC)   S/P AKA (above knee amputation) unilateral, left (HCC)   [name] is a 89 y.o. female with medical history significant for dementia, hypertension, hyperlipidemia, type II DM, PAD, CAD, stroke with left hemiparesis, history of left foot wounds, hospitalized from 11/06/2023 through 11/11/2023 for critical left lower extremity ischemia s/p angioplasty of left lower extremity on 11/06/2023.  She was referred from the wound care clinic to the ED for further evaluation of left foot wound because of concern for gangrene/abscess.     She was found to have osteomyelitis of the left foot.   Left foot osteomyelitis, leukocytosis: S/p left above-the-knee amputation on 12/09/2023.  -- S/p 3 days of IV Zosyn  and vancomycin --now d/ced --Follow-up with vascular surgeon for wound care--Dr Elnor  changed post-op dressing on Saturday and ok to d/c today   Acute postoperative blood loss anemia, anemia of chronic disease: -- Hemoglobin down to 7.   --Discussed risks and benefits of blood transfusion with Ms. Alberta Rolling (legal guardian with DSS) over the phone.  She is agreeable to blood transfusion.  Transfuse 1 unit of PRBCs because of underlying CAD.  Hemoglobin was 10 on admission. --hgb stable at 8.3    Hypertension: Continue lisinopril    Type II DM with hyperglycemia: NovoLog  as  needed for hyperglycemia.  Recent hemoglobin A1c on 11/07/2023 was 5.9. Hemoglobin A1c from 08/08/2022 was 8.1.     CAD, PVD s/p left lower extremity angioplasty on 11/06/2023, history of stroke: --Continue aspirin  and Lipitor    History of stroke with left hemiparesis.  Patient is nonambulatory at baseline.   Co-morbidities include hyperlipidemia, dementia, chronic anemia     Procedures: Left BKA Family communication : DSS Legal guardian Alberta Estrin on 10/31 Consults :Vascular sx CODE STATUS:  DVT Prophylaxis : Level of care: Med-Surg     Pain control - [place]  Controlled Substance Reporting System database was reviewed. and patient was instructed, not to drive, operate heavy machinery, perform activities at heights, swimming or participation in water activities or provide baby-sitting services while on Pain, Sleep and Anxiety Medications; until their outpatient Physician has advised to do so again. Also recommended to not to take more than prescribed Pain, Sleep and Anxiety Medications.  Disposition: Long term care facility Diet recommendation:  Discharge Diet Orders (From admission, onward)     Start     Ordered   12/12/23 0000  Diet - low sodium heart healthy        12/12/23 1203           Cardiac diet DISCHARGE MEDICATION: Allergies as of 12/12/2023       Reactions   Metformin  And Related Nausea And Vomiting        Medication List     TAKE these medications    acetaminophen  325 MG tablet Commonly known as: TYLENOL  Take 2 tablets (650 mg  total) by mouth every 6 (six) hours as needed for mild pain, moderate pain, fever or headache.   ascorbic acid  500 MG tablet Commonly known as: VITAMIN C  Take 500 mg by mouth 2 (two) times daily.   aspirin  81 MG chewable tablet Chew 81 mg by mouth daily.   atorvastatin  80 MG tablet Commonly known as: LIPITOR  Take 1 tablet (80 mg total) by mouth daily.   bisacodyl  10 MG suppository Commonly known as:  DULCOLAX Place 1 suppository (10 mg total) rectally daily as needed for severe constipation.   cholecalciferol 25 MCG (1000 UNIT) tablet Commonly known as: VITAMIN D3 Take 5,000 Units by mouth every Monday.   clopidogrel  75 MG tablet Commonly known as: PLAVIX  Take 1 tablet (75 mg total) by mouth daily.   cyanocobalamin  1000 MCG tablet Commonly known as: VITAMIN B12 Take 1,000 mcg by mouth daily.   feeding supplement (GLUCERNA SHAKE) Liqd Take 237 mLs by mouth 3 (three) times daily between meals.   feeding supplement (PRO-STAT SUGAR FREE 64) Liqd Take 30 mLs by mouth in the morning and at bedtime.   ferrous sulfate  220 (44 Fe) MG/5ML solution Take 220 mg by mouth every other day.   geriatric multivitamins-minerals Liqd Take 15 mLs by mouth daily.   Glucagon  Emergency 1 MG Kit Inject 1 mg into the muscle once as needed (low blood sugar).   hydrALAZINE  25 MG tablet Commonly known as: APRESOLINE  Take 1 tablet (25 mg total) by mouth 3 (three) times daily.   insulin  aspart 100 UNIT/ML injection Commonly known as: novoLOG  CBG 70 - 120: 0 units CBG 121 - 150: 2 units CBG 151 - 200: 3 units CBG 201 - 250: 5 units CBG 251 - 300: 8 units CBG 301 - 350: 11 units CBG 351 - 400: 15 units CBG > 400: call MD   lisinopril  20 MG tablet Commonly known as: ZESTRIL  Take 20 mg by mouth daily.   magnesium  oxide 400 (240 Mg) MG tablet Commonly known as: MAG-OX Take 400 mg by mouth daily.   naloxone 4 MG/0.1ML Liqd nasal spray kit Commonly known as: NARCAN Place 1 spray into the nose 3 (three) times daily as needed (opioid overodse).   oxyCODONE  5 MG immediate release tablet Commonly known as: Oxy IR/ROXICODONE  Take 1 tablet (5 mg total) by mouth 3 (three) times daily as needed.   polyethylene glycol 17 g packet Commonly known as: MIRALAX  / GLYCOLAX  Take 17 g by mouth daily.   Santyl 250 UNIT/GM ointment Generic drug: collagenase Apply 1 Application topically daily.                Discharge Care Instructions  (From admission, onward)           Start     Ordered   12/12/23 0000  Discharge wound care:       Comments: Change Amputation stump dressing if soiled and place dry gauze and wrap with bandage--as per Vascular surgeon   12/12/23 1203            Contact information for follow-up providers     Rudolpho Norleen BIRCH, MD Follow up.   Specialty: Internal Medicine Why: hospital follow up Contact information: [address] [place] [address] [phone]         Marea Selinda RAMAN, MD. Schedule an appointment as soon as possible for a visit in 2 week(s).   Specialties: Vascular Surgery, Radiology, Interventional Cardiology Why: for post amputation f/u Contact information: [address] [place] [address] [phone]              Contact information for after-discharge care     Destination     [facility] [place], [place]. [place]   Service: Skilled Nursing Contact information: [address] [place]  [address] [phone]                    Discharge Exam: Filed Weights   12/04/23 2101 12/10/23 0500  Weight: 54.9 kg 47.8 kg    GENERAL:  89 y.o.-year-old patient with no acute distress. Frail cachectic LUNGS: Normal breath sounds bilaterally, no wheezing CARDIOVASCULAR: S1, S2 normal. No murmur   ABDOMEN: Soft, nontender, nondistended. Bowel sounds present.  EXTREMITIES: left below knee amputation dressing + NEUROLOGIC: alert and awake. Non focal SKIN: per RN Condition at discharge: fair  The results of significant diagnostics from this hospitalization (including imaging, microbiology, ancillary and laboratory) are listed below for reference.   Imaging Studies: CT FOOT LEFT W WO CONTRAST Result Date: 12/04/2023 CLINICAL DATA:  Provided history: Osteomyelitis suspected, foot, xray done EXAM: CT OF THE LOWER LEFT EXTREMITY WITHOUT CONTRAST TECHNIQUE:  Multidetector CT imaging of the lower left extremity was performed following the standard protocol before and during bolus administration of intravenous contrast. RADIATION DOSE REDUCTION: This exam was performed according to the departmental dose-optimization program which includes automated exposure control, adjustment of the mA and/or kV according to patient size and/or use of iterative reconstruction technique. CONTRAST:  OMNIPAQUE  IOHEXOL  300 MG/ML  SOLN COMPARISON:  Radiograph earlier today FINDINGS: Patient with limited mobility, there is significant streak artifact from right knee replacement that is unable to be removed from the field of view. This significantly limits assessment. Bones/Joint/Cartilage The bones are diffusely under mineralized. Previous transmetatarsal amputation of the first ray. The fifth ray has been resected. There are destructive changes in the second toe distal and middle phalanx with air in the distal phalanx and adjacent soft tissues. The distal phalanx is exposed to air. No other obvious bony destructive changes. No evidence of acute fracture. Pes planus is better demonstrated on radiograph. Ligaments Suboptimally assessed by CT. Muscles and Tendons Fatty atrophy of the intrinsic musculature of the foot. Soft tissues There is air in the soft tissues adjacent to the distal second digit, the distal digit is exposed to air. Circumferential soft tissue thickening of the toe. Fluid collection in the plantar foot subjacent to and distal to the resected first metatarsal measures approximately 2.7 x 1.5 x 1.8 cm, series 18, image 71. Soft tissue thickening extends distally with fluid surrounding the second toe into the plantar soft tissues, sterility indeterminate, series 18, image 54. Diffuse forefoot soft tissue thickening. Prominent vascular calcifications. IMPRESSION: 1. Osteomyelitis of the second toe distal and middle phalanx with air in the distal phalanx and adjacent soft  tissues. The distal phalanx is exposed to air. 2. Fluid collection in the plantar foot subjacent to and distal to the resected first metatarsal measures approximately 2.7 x 1.5 x 1.8 cm, suspicious for abscess. Soft tissue thickening extends distally with fluid surrounding the second toe into the plantar soft tissues, sterility indeterminate. 3. Previous transmetatarsal amputation of the first ray. The fifth ray has been resected. 4. Osteoporosis as well as streak artifact limits detailed CT assessment. Electronically Signed   By: Andrea Gasman M.D.   On: 12/04/2023 17:29   DG Foot Complete Left Result Date: 12/04/2023 EXAM: 3 or more VIEW(S) XRAY OF THE  LEFT FOOT 12/04/2023 12:36:00 PM COMPARISON: 11/10/2023 CLINICAL HISTORY: infection. Pt was sent over from the wound center for wound check. Pt was there for a routine leg check and staff at wound care sent pt over because they are worried about gangrene and an abscess. Pt has dementia at baseline and hx of diabetes FINDINGS: BONES AND JOINTS: New erosion of the distal aspect of the second toe middle phalanx concerning for osteomyelitis. Erosion of the proximal aspect of the second toe distal phalanx. ossible erosive changes are also noted involving the istal aspect of the nd roximal phalanx. Prior transmetatarsal amputation of the first digit at the mid metatarsal. Status post previous resection of the 5th ray. . No joint dislocation. SOFT TISSUES: Extensive vascular calcification. Diffuse soft tissue swelling of the forefoot. IMPRESSION: 1. Findings concerning for osteomyelitis involving the second toe at the level of the proximal distal phalanx distal middle phalanx and distal aspect of the proximal phalanx. 2. Diffuse soft tissue edema and extensive vascular calcifications. 3. Diffuse osteopenia. Electronically signed by: Waddell Calk MD 12/04/2023 01:10 PM EDT RP Workstation: [id number]    Microbiology: Results for orders placed or performed during  the hospital encounter of 12/04/23  Aerobic/Anaerobic Culture w Gram Stain (surgical/deep wound)     Status: None   Collection Time: 12/04/23  7:54 PM   Specimen: Foot; Wound  Result Value Ref Range Status   Specimen Description   Final    FOOT Performed at [facility], [address], [place], [place] [address]    Special Requests   Final    Immunocompromised Performed at [facility], [address] [place], [place] [address]    Gram Stain   Final    FEW WBC PRESENT,BOTH PMN AND MONONUCLEAR FEW GRAM POSITIVE COCCI    Culture   Final    MODERATE METHICILLIN RESISTANT STAPHYLOCOCCUS AUREUS NO ANAEROBES ISOLATED Performed at [facility], [address] [place], [place] [address]    Report Status 12/10/2023 FINAL  Final   Organism ID, Bacteria METHICILLIN RESISTANT STAPHYLOCOCCUS AUREUS  Final      Susceptibility   Methicillin resistant staphylococcus aureus - MIC*    CIPROFLOXACIN >=8 RESISTANT Resistant     ERYTHROMYCIN >=8 RESISTANT Resistant     GENTAMICIN <=0.5 SENSITIVE Sensitive     OXACILLIN >=4 RESISTANT Resistant     TETRACYCLINE <=1 SENSITIVE Sensitive     VANCOMYCIN  1 SENSITIVE Sensitive     TRIMETH/SULFA <=10 SENSITIVE Sensitive     CLINDAMYCIN <=0.25 SENSITIVE Sensitive     RIFAMPIN <=0.5 SENSITIVE Sensitive     Inducible Clindamycin NEGATIVE Sensitive     LINEZOLID 2 SENSITIVE Sensitive     * MODERATE METHICILLIN RESISTANT STAPHYLOCOCCUS AUREUS  Blood culture (routine x 2)     Status: None   Collection Time: 12/04/23  8:06 PM   Specimen: BLOOD  Result Value Ref Range Status   Specimen Description BLOOD BLOOD RIGHT FOREARM  Final   Special Requests   Final    BOTTLES DRAWN AEROBIC AND ANAEROBIC Blood Culture results may not be optimal due to an inadequate volume of blood received in culture bottles   Culture   Final    NO GROWTH 5 DAYS Performed at [facility], [address], [place], [place] [address]    Report  Status 12/09/2023 FINAL  Final  Blood culture (routine x 2)     Status: None   Collection Time: 12/04/23  8:07 PM   Specimen: BLOOD  Result Value Ref Range Status   Specimen Description BLOOD BLOOD LEFT FOREARM  Final   Special Requests   Final    BOTTLES DRAWN AEROBIC AND ANAEROBIC Blood Culture results may not be optimal due to an inadequate volume of blood received in culture bottles   Culture   Final    NO GROWTH 5 DAYS Performed at [facility], [address] [place], [place] [address]    Report Status 12/09/2023 FINAL  Final    Labs: CBC: Recent Labs  Lab 12/06/23 0338 12/10/23 0413 12/11/23 0527  WBC 14.5* 18.0* 14.5*  HGB 7.8* 7.0* 8.3*  HCT 26.3* 23.2* 25.8*  MCV 95.3 93.5 89.3  PLT 388 442* 362   Basic Metabolic Panel: Recent Labs  Lab 12/06/23 0338  NA 142  K 4.0  CL 108  CO2 23  GLUCOSE 69*  BUN 18  CREATININE 0.55  CALCIUM  8.1*  MG 2.1  PHOS 3.4   Liver Function Tests: Recent Labs  Lab 12/06/23 0338  ALBUMIN 2.1*   CBG: Recent Labs  Lab 12/11/23 0854 12/11/23 1151 12/11/23 1655 12/11/23 2140 12/12/23 0750  GLUCAP 159* 233* 242* 179* 155*    Discharge time spent: greater than 30 minutes.  Signed: Leita Blanch, MD [facility] 12/12/2023

## 2023-12-12 NOTE — Plan of Care (Signed)

## 2023-12-12 NOTE — TOC Progression Note (Addendum)
 Transition of Care [facility]) - Progression Note    Patient Details  Name: [name] MRN: [id number] Date of Birth: 06-27-1933  Transition of Care [facility]) CM/SW Contact  Marinda Cooks, RN Phone Number: 12/12/2023, 12:46 PM  Clinical Narrative:    This CM updated by MD pt medically cleared to dc back to [facility]. This CM placed call to pt's assigned Legal Guardian Rubie Moats to update  and was sent to VM . This CM called main DSS number and placed request to speak to on call DSS point of contact awaiting call back. TOC will cont to follow dc planning / care coordination and update as applicable   13:42pm- This CM rec'd call back from on call DSS / weekend legal guardian Lauraine   pt is not  to return to [facility] at this time , informing pt's assigned Legal Guardian will return on Monday and /u with TOC regarding DC plan.  Expected Discharge Plan: Long Term Acute Care (LTAC) Barriers to Discharge: Continued Medical Work up    DSS Service [facility]: [facility]          Expected Discharge Plan and Services       Living arrangements for the past 2 months: Skilled Nursing Facility Expected Discharge Date: 12/12/23                                     Social Drivers of Health (SDOH) Interventions SDOH Screenings   Food Insecurity: Patient Unable To Answer (12/05/2023)  Housing: Unknown (12/05/2023)  Transportation Needs: Patient Unable To Answer (12/05/2023)  Utilities: Patient Unable To Answer (12/05/2023)  Social Connections: Patient Unable To Answer (12/05/2023)  Tobacco Use: Low Risk  (12/09/2023)    Readmission Risk Interventions    05/01/2023    4:26 PM  Readmission Risk Prevention Plan  Transportation Screening Complete  HRI or Home Care Consult Complete  Social Work Consult for Recovery Care Planning/Counseling Complete  Medication Review Oceanographer) Complete

## 2023-12-12 NOTE — Progress Notes (Signed)
 Report called to [facility] and spoke with Alan (Nurse receiving pt)  AVS reviewed and questions answered.  Waiting on transportation

## 2023-12-15 LAB — VITAMIN A: Vitamin A (Retinoic Acid): 20.6 ug/dL — ABNORMAL LOW (ref 22.0–69.5)

## 2023-12-15 NOTE — Anesthesia Postprocedure Evaluation (Signed)
 Anesthesia Post Note  Patient: Jadine G Reidinger  Procedure(s) Performed: AMPUTATION, ABOVE KNEE (Left: Knee)  Patient location during evaluation: PACU Anesthesia Type: General Level of consciousness: awake and alert Pain management: pain level controlled Vital Signs Assessment: post-procedure vital signs reviewed and stable Respiratory status: spontaneous breathing, nonlabored ventilation, respiratory function stable and patient connected to nasal cannula oxygen Cardiovascular status: blood pressure returned to baseline and stable Postop Assessment: no apparent nausea or vomiting Anesthetic complications: no   No notable events documented.   Last Vitals:  Vitals:   12/12/23 1757 12/12/23 2012  BP: (!) 153/75 126/66  Pulse: 78 82  Resp: 20 17  Temp: 37.2 C 37.2 C  SpO2: 100% 94%    Last Pain:  Vitals:   12/12/23 1927  TempSrc:   PainSc: 0-No pain                 Lynwood KANDICE Clause

## 2023-12-25 ENCOUNTER — Encounter: Attending: Physician Assistant | Admitting: Physician Assistant

## 2023-12-28 ENCOUNTER — Encounter (INDEPENDENT_AMBULATORY_CARE_PROVIDER_SITE_OTHER): Payer: Self-pay | Admitting: Nurse Practitioner

## 2023-12-28 ENCOUNTER — Ambulatory Visit (INDEPENDENT_AMBULATORY_CARE_PROVIDER_SITE_OTHER): Admitting: Nurse Practitioner

## 2023-12-28 VITALS — BP 135/77 | HR 76 | Resp 18

## 2023-12-28 DIAGNOSIS — S78112A Complete traumatic amputation at level between left hip and knee, initial encounter: Secondary | ICD-10-CM

## 2023-12-28 NOTE — Progress Notes (Signed)
 SUBJECTIVE:  Patient ID: [name], female    DOB: 10/17/1933, 89 y.o.   MRN: [id number] Chief Complaint  Patient presents with   Follow-up    2 week follow up post amputation L AKA    Discussed the use of AI scribe software for clinical note transcription with the patient, who gave verbal consent to proceed.  History of Present Illness [name] is an 89 year old female who presents with leg pain and staple removal post-surgery.She underwent Left AKA on 12/09/2023  She experiences persistent aching in her leg, specifically noting that the pain is localized to her leg rather than her foot. The pain is described as a general ache without specific timing or triggers.She is given pain medication at her facility   She underwent a left AKA. that required staples. She did not feel any discomfort from the staples prior to her removal.     Results Procedure: Staple removal   Description: Staples removed from incision. Incision is clean, dry, intact, without dehiscence or signs of infection.  Past Medical History:  Diagnosis Date   Carpal tunnel syndrome on both sides    CVA (cerebral vascular accident) (HCC) 11/14/2019   Diabetes (HCC)    Diabetic polyneuropathy (HCC)    Diverticulitis    Hyperlipidemia    Hypertension    Osteoarthritis    Sepsis (HCC) 03/22/2018   Suspected UTI 04/09/2023   UTI (urinary tract infection) 09/20/2019   Vaginal prolapse     Past Surgical History:  Procedure Laterality Date   AMPUTATION Left 05/01/2023   Procedure: AMPUTATION, FOOT, RAY;  Surgeon: Lennie Barter, DPM;  Location: [facility];  Service: Orthopedics/Podiatry;  Laterality: Left;  LEFT PARTIAL FIRST RAY   AMPUTATION Left 12/09/2023   Procedure: AMPUTATION, ABOVE KNEE;  Surgeon: Marea Selinda RAMAN, MD;  Location: [facility];  Service: Vascular;  Laterality: Left;   AMPUTATION TOE Left 08/13/2022   Procedure: EXCISION OF 5TH RAY;  Surgeon: Ashley Soulier, DPM;  Location: [facility];   Service: Orthopedics/Podiatry;  Laterality: Left;   CESAREAN SECTION     IRRIGATION AND DEBRIDEMENT FOOT Left 01/09/2022   Procedure: IRRIGATION AND DEBRIDEMENT FOOT WITH BONE BIOPSY;  Surgeon: Ashley Soulier, DPM;  Location: [facility];  Service: Podiatry;  Laterality: Left;   LOWER EXTREMITY ANGIOGRAPHY Left 06/11/2021   Procedure: Lower Extremity Angiography;  Surgeon: Jama Cordella KANDICE, MD;  Location: [facility];  Service: Cardiovascular;  Laterality: Left;   LOWER EXTREMITY ANGIOGRAPHY Left 08/11/2022   Procedure: Lower Extremity Angiography;  Surgeon: Jama Cordella KANDICE, MD;  Location: [facility];  Service: Cardiovascular;  Laterality: Left;   LOWER EXTREMITY ANGIOGRAPHY Left 04/30/2023   Procedure: Lower Extremity Angiography;  Surgeon: Marea Selinda RAMAN, MD;  Location: [facility];  Service: Cardiovascular;  Laterality: Left;   LOWER EXTREMITY ANGIOGRAPHY Left 11/06/2023   Procedure: Lower Extremity Angiography;  Surgeon: Jama Cordella KANDICE, MD;  Location: [facility];  Service: Cardiovascular;  Laterality: Left;   pessary     REPLACEMENT TOTAL KNEE BILATERAL     VENTRAL HERNIA REPAIR     VENTRAL HERNIA REPAIR N/A 01/13/2016   Procedure: HERNIA REPAIR VENTRAL ADULT WITH SMALL BOWEL RESECTION, LYSIS OF ADHESIONS;  Surgeon: Dorothyann LITTIE Husk, MD;  Location: [facility];  Service: General;  Laterality: N/A;    Social History   Socioeconomic History   Marital status: Widowed    Spouse name: Not on file   Number of children: Not  on file   Years of education: Not on file   Highest education level: Not on file  Occupational History   Not on file  Tobacco Use   Smoking status: Never   Smokeless tobacco: Never  Vaping Use   Vaping status: Never Used  Substance and Sexual Activity   Alcohol  use: No   Drug use: No   Sexual activity: Not Currently  Other Topics Concern   Not on file  Social History Narrative   Lives at home by herself. Ambulates with a  cane.   Social Drivers of Corporate Investment Banker Strain: Not on file  Food Insecurity: Patient Unable To Answer (12/05/2023)   Hunger Vital Sign    Worried About Running Out of Food in the Last Year: Patient unable to answer    Ran Out of Food in the Last Year: Patient unable to answer  Transportation Needs: Patient Unable To Answer (12/05/2023)   PRAPARE - Transportation    Lack of Transportation (Medical): Patient unable to answer    Lack of Transportation (Non-Medical): Patient unable to answer  Physical Activity: Not on file  Stress: Not on file  Social Connections: Patient Unable To Answer (12/05/2023)   Social Connection and Isolation Panel    Frequency of Communication with Friends and Family: Patient unable to answer    Frequency of Social Gatherings with Friends and Family: Patient unable to answer    Attends Religious Services: Patient unable to answer    Active Member of Clubs or Organizations: Patient unable to answer    Attends Banker Meetings: Patient unable to answer    Marital Status: Patient unable to answer  Intimate Partner Violence: Patient Unable To Answer (12/05/2023)   Humiliation, Afraid, Rape, and Kick questionnaire    Fear of Current or Ex-Partner: Patient unable to answer    Emotionally Abused: Patient unable to answer    Physically Abused: Patient unable to answer    Sexually Abused: Patient unable to answer    Family History  Problem Relation Age of Onset   Breast cancer Mother 41   Breast cancer Maternal Aunt     Allergies  Allergen Reactions   Metformin  And Related Nausea And Vomiting     Review of Systems   Review of Systems: Negative Unless Checked Constitutional: [] Weight loss  [] Fever  [] Chills Cardiac: [] Chest pain   []  Atrial Fibrillation  [] Palpitations   [] Shortness of breath when laying flat   [] Shortness of breath with exertion. [] Shortness of breath at rest Vascular:  [] Pain in legs with walking   [] Pain in  legs with standing [] Pain in legs when laying flat   [] Claudication    [] Pain in feet when laying flat    [] History of DVT   [] Phlebitis   [] Swelling in legs   [] Varicose veins   [] Non-healing ulcers Pulmonary:   [] Uses home oxygen   [] Productive cough   [] Hemoptysis   [] Wheeze  [] COPD   [] Asthma Neurologic:  [] Dizziness   [] Seizures  [] Blackouts [] History of stroke   [] History of TIA  [] Aphasia   [] Temporary Blindness   [] Weakness or numbness in arm   [] Weakness or numbness in leg Musculoskeletal:   [] Joint swelling   [] Joint pain   [] Low back pain  []  History of Knee Replacement [] Arthritis [] back Surgeries  []  Spinal Stenosis    Hematologic:  [] Easy bruising  [] Easy bleeding   [] Hypercoagulable state   [] Anemic Gastrointestinal:  [] Diarrhea   [] Vomiting  [] Gastroesophageal reflux/heartburn   []   Difficulty swallowing. [] Abdominal pain Genitourinary:  [] Chronic kidney disease   [] Difficult urination  [] Anuric   [] Blood in urine [] Frequent urination  [] Burning with urination   [] Hematuria Skin:  [] Rashes   [] Ulcers [x] Wounds Psychological:  [] History of anxiety   []  History of major depression  []  Memory Difficulties      OBJECTIVE:     BP 135/77 (BP Location: Right Arm, Patient Position: Sitting, Cuff Size: Normal)   Pulse 76   Resp 18   Physical Exam Vitals reviewed.  HENT:     Head: Normocephalic.  Cardiovascular:     Rate and Rhythm: Normal rate.  Pulmonary:     Effort: Pulmonary effort is normal.  Musculoskeletal:     Left Lower Extremity: Left leg is amputated above knee.  Skin:    General: Skin is warm and dry.  Neurological:     Mental Status: She is alert and oriented to person, place, and time.     Motor: Weakness present.  Psychiatric:        Mood and Affect: Mood normal.        Behavior: Behavior normal.        Thought Content: Thought content normal.    Physical Exam EXTREMITIES: Incision clean, dry, intact. Healing well with no signs of infection.   CMP      Component Value Date/Time   NA 142 12/06/2023 0338   NA 143 09/24/2013 0409   K 4.0 12/06/2023 0338   K 3.6 09/24/2013 0409   CL 108 12/06/2023 0338   CL 108 (H) 09/24/2013 0409   CO2 23 12/06/2023 0338   CO2 27 09/24/2013 0409   GLUCOSE 69 (L) 12/06/2023 0338   GLUCOSE 222 (H) 09/24/2013 0409   BUN 18 12/06/2023 0338   BUN 7 09/24/2013 0409   CREATININE 0.55 12/06/2023 0338   CREATININE 0.77 09/24/2013 0409   CALCIUM  8.1 (L) 12/06/2023 0338   CALCIUM  7.7 (L) 09/24/2013 0409   PROT 8.6 (H) 12/04/2023 1211   PROT 5.4 (L) 09/22/2013 0507   ALBUMIN 2.1 (L) 12/06/2023 0338   ALBUMIN 2.3 (L) 09/22/2013 0507   AST 21 12/04/2023 1211   AST 19 09/22/2013 0507   ALT 10 12/04/2023 1211   ALT 13 (L) 09/22/2013 0507   ALKPHOS 85 12/04/2023 1211   ALKPHOS 75 09/22/2013 0507   BILITOT 0.7 12/04/2023 1211   BILITOT 0.3 09/22/2013 0507   GFRNONAA >60 12/06/2023 0338   GFRNONAA >60 09/24/2013 0409    No results found.     ASSESSMENT AND PLAN:  1. Unilateral AKA, left (HCC) (Primary) Incision clean, dry, intact. No dehiscence or infection. Staples removed without complications. - Removed half of the staples today. - Scheduled follow-up in one week for removal of remaining staples. - Applied steri-strips and wrapped the wound.  Leg pain Persistent aching leg pain. Ongoing pain management with prescribed medication.   Current Outpatient Medications on File Prior to Visit  Medication Sig Dispense Refill   acetaminophen  (TYLENOL ) 325 MG tablet Take 2 tablets (650 mg total) by mouth every 6 (six) hours as needed for mild pain, moderate pain, fever or headache. 60 tablet 0   Amino Acids -Protein Hydrolys (FEEDING SUPPLEMENT, PRO-STAT SUGAR FREE 64,) LIQD Take 30 mLs by mouth in the morning and at bedtime.     ascorbic acid  (VITAMIN C ) 500 MG tablet Take 500 mg by mouth 2 (two) times daily.     aspirin  81 MG chewable tablet Chew 81 mg by mouth daily.  atorvastatin  (LIPITOR ) 80 MG  tablet Take 1 tablet (80 mg total) by mouth daily. 30 tablet 0   bisacodyl  (DULCOLAX) 10 MG suppository Place 1 suppository (10 mg total) rectally daily as needed for severe constipation.     cholecalciferol (VITAMIN D3) 25 MCG (1000 UNIT) tablet Take 5,000 Units by mouth every Monday.     clopidogrel  (PLAVIX ) 75 MG tablet Take 1 tablet (75 mg total) by mouth daily. 30 tablet 3   cyanocobalamin  (VITAMIN B12) 1000 MCG tablet Take 1,000 mcg by mouth daily.     feeding supplement, GLUCERNA SHAKE, (GLUCERNA SHAKE) LIQD Take 237 mLs by mouth 3 (three) times daily between meals.     ferrous sulfate  220 (44 Fe) MG/5ML solution Take 220 mg by mouth every other day.     geriatric multivitamins-minerals (ELDERTONIC/GEVRABON) LIQD Take 15 mLs by mouth daily.     Glucagon , rDNA, (GLUCAGON  EMERGENCY) 1 MG KIT Inject 1 mg into the muscle once as needed (low blood sugar).     hydrALAZINE  (APRESOLINE ) 25 MG tablet Take 1 tablet (25 mg total) by mouth 3 (three) times daily. 90 tablet 1   insulin  aspart (NOVOLOG ) 100 UNIT/ML injection CBG 70 - 120: 0 units CBG 121 - 150: 2 units CBG 151 - 200: 3 units CBG 201 - 250: 5 units CBG 251 - 300: 8 units CBG 301 - 350: 11 units CBG 351 - 400: 15 units CBG > 400: call MD     lisinopril  (ZESTRIL ) 20 MG tablet Take 20 mg by mouth daily.     magnesium  oxide (MAG-OX) 400 (240 Mg) MG tablet Take 400 mg by mouth daily.     naloxone (NARCAN) nasal spray 4 mg/0.1 mL Place 1 spray into the nose 3 (three) times daily as needed (opioid overodse).     oxyCODONE  (OXY IR/ROXICODONE ) 5 MG immediate release tablet Take 1 tablet (5 mg total) by mouth 3 (three) times daily as needed. 10 tablet 0   polyethylene glycol (MIRALAX  / GLYCOLAX ) 17 g packet Take 17 g by mouth daily.     SANTYL 250 UNIT/GM ointment Apply 1 Application topically daily.     No current facility-administered medications on file prior to visit.    There are no Patient Instructions on file for this visit. Return in  about 1 week (around 01/04/2024) for staple removal.   Orvin FORBES Daring, NP  This note was completed with Office Manager.  Any errors are purely unintentional.

## 2024-01-04 ENCOUNTER — Encounter (INDEPENDENT_AMBULATORY_CARE_PROVIDER_SITE_OTHER): Payer: Self-pay | Admitting: Nurse Practitioner

## 2024-01-04 ENCOUNTER — Ambulatory Visit (INDEPENDENT_AMBULATORY_CARE_PROVIDER_SITE_OTHER): Admitting: Nurse Practitioner

## 2024-01-04 VITALS — BP 134/72 | HR 70 | Resp 17

## 2024-01-04 DIAGNOSIS — S78112A Complete traumatic amputation at level between left hip and knee, initial encounter: Secondary | ICD-10-CM

## 2024-01-11 ENCOUNTER — Encounter (INDEPENDENT_AMBULATORY_CARE_PROVIDER_SITE_OTHER): Payer: Self-pay | Admitting: Nurse Practitioner

## 2024-01-11 NOTE — Progress Notes (Signed)
 Subjective:    Patient ID: [name], female    DOB: 1933-12-12, 89 y.o.   MRN: [id number] Chief Complaint  Patient presents with   Follow-up    staple removal.    HPI  Discussed the use of AI scribe software for clinical note transcription with the patient, who gave verbal consent to proceed.  History of Present Illness An 89 year old female who presents for staple removal and wound evaluation following a leg amputation.  She feels okay overall but was unaware of the left leg amputation until after the procedure. She expresses surprise and a lack of prior knowledge about the surgery, stating, 'I didn't know that I was, it was taken away.'    Results Procedure: Staple removal   Description: Staples removed from the wound. Incision intact without signs of infection. Concern noted at the distal end of the incision.   Review of Systems  Skin:  Positive for wound.  All other systems reviewed and are negative.      Objective:   Physical Exam Vitals reviewed.  HENT:     Head: Normocephalic.  Cardiovascular:     Rate and Rhythm: Normal rate.  Pulmonary:     Effort: Pulmonary effort is normal.  Skin:    General: Skin is warm.  Neurological:     Mental Status: She is alert and oriented to person, place, and time.  Psychiatric:        Mood and Affect: Mood normal.        Behavior: Behavior normal.        Thought Content: Thought content normal.        Judgment: Judgment normal.     Physical Exam EXTREMITIES: Incision on leg fairly well intact. Distal part of incision slightly concerning. No signs of infection in leg incision.  BP 134/72   Pulse 70   Resp 17   Past Medical History:  Diagnosis Date   Carpal tunnel syndrome on both sides    CVA (cerebral vascular accident) (HCC) 11/14/2019   Diabetes (HCC)    Diabetic polyneuropathy (HCC)    Diverticulitis    Hyperlipidemia    Hypertension    Osteoarthritis    Sepsis (HCC) 03/22/2018   Suspected UTI  04/09/2023   UTI (urinary tract infection) 09/20/2019   Vaginal prolapse     Social History   Socioeconomic History   Marital status: Widowed    Spouse name: Not on file   Number of children: Not on file   Years of education: Not on file   Highest education level: Not on file  Occupational History   Not on file  Tobacco Use   Smoking status: Never   Smokeless tobacco: Never  Vaping Use   Vaping status: Never Used  Substance and Sexual Activity   Alcohol  use: No   Drug use: No   Sexual activity: Not Currently  Other Topics Concern   Not on file  Social History Narrative   Lives at home by herself. Ambulates with a cane.   Social Drivers of Corporate Investment Banker Strain: Not on file  Food Insecurity: Patient Unable To Answer (12/05/2023)   Hunger Vital Sign    Worried About Running Out of Food in the Last Year: Patient unable to answer    Ran Out of Food in the Last Year: Patient unable to answer  Transportation Needs: Patient Unable To Answer (12/05/2023)   PRAPARE - Administrator, Civil Service (Medical): Patient  unable to answer    Lack of Transportation (Non-Medical): Patient unable to answer  Physical Activity: Not on file  Stress: Not on file  Social Connections: Patient Unable To Answer (12/05/2023)   Social Connection and Isolation Panel    Frequency of Communication with Friends and Family: Patient unable to answer    Frequency of Social Gatherings with Friends and Family: Patient unable to answer    Attends Religious Services: Patient unable to answer    Active Member of Clubs or Organizations: Patient unable to answer    Attends Banker Meetings: Patient unable to answer    Marital Status: Patient unable to answer  Intimate Partner Violence: Patient Unable To Answer (12/05/2023)   Humiliation, Afraid, Rape, and Kick questionnaire    Fear of Current or Ex-Partner: Patient unable to answer    Emotionally Abused: Patient unable  to answer    Physically Abused: Patient unable to answer    Sexually Abused: Patient unable to answer    Past Surgical History:  Procedure Laterality Date   AMPUTATION Left 05/01/2023   Procedure: AMPUTATION, FOOT, RAY;  Surgeon: Lennie Barter, DPM;  Location: [facility];  Service: Orthopedics/Podiatry;  Laterality: Left;  LEFT PARTIAL FIRST RAY   AMPUTATION Left 12/09/2023   Procedure: AMPUTATION, ABOVE KNEE;  Surgeon: Marea Selinda RAMAN, MD;  Location: [facility];  Service: Vascular;  Laterality: Left;   AMPUTATION TOE Left 08/13/2022   Procedure: EXCISION OF 5TH RAY;  Surgeon: Ashley Soulier, DPM;  Location: [facility];  Service: Orthopedics/Podiatry;  Laterality: Left;   CESAREAN SECTION     IRRIGATION AND DEBRIDEMENT FOOT Left 01/09/2022   Procedure: IRRIGATION AND DEBRIDEMENT FOOT WITH BONE BIOPSY;  Surgeon: Ashley Soulier, DPM;  Location: [facility];  Service: Podiatry;  Laterality: Left;   LOWER EXTREMITY ANGIOGRAPHY Left 06/11/2021   Procedure: Lower Extremity Angiography;  Surgeon: Jama Cordella MATSU, MD;  Location: [facility];  Service: Cardiovascular;  Laterality: Left;   LOWER EXTREMITY ANGIOGRAPHY Left 08/11/2022   Procedure: Lower Extremity Angiography;  Surgeon: Jama Cordella MATSU, MD;  Location: [facility];  Service: Cardiovascular;  Laterality: Left;   LOWER EXTREMITY ANGIOGRAPHY Left 04/30/2023   Procedure: Lower Extremity Angiography;  Surgeon: Marea Selinda RAMAN, MD;  Location: [facility];  Service: Cardiovascular;  Laterality: Left;   LOWER EXTREMITY ANGIOGRAPHY Left 11/06/2023   Procedure: Lower Extremity Angiography;  Surgeon: Jama Cordella MATSU, MD;  Location: [facility];  Service: Cardiovascular;  Laterality: Left;   pessary     REPLACEMENT TOTAL KNEE BILATERAL     VENTRAL HERNIA REPAIR     VENTRAL HERNIA REPAIR N/A 01/13/2016   Procedure: HERNIA REPAIR VENTRAL ADULT WITH SMALL BOWEL RESECTION, LYSIS OF ADHESIONS;  Surgeon: Dorothyann LITTIE Husk, MD;   Location: [facility];  Service: General;  Laterality: N/A;    Family History  Problem Relation Age of Onset   Breast cancer Mother 36   Breast cancer Maternal Aunt     Allergies  Allergen Reactions   Metformin  And Related Nausea And Vomiting       Latest Ref Rng & Units 12/11/2023    5:27 AM 12/10/2023    4:13 AM 12/06/2023    3:38 AM  CBC  WBC 4.0 - 10.5 K/uL 14.5  18.0  14.5   Hemoglobin 12.0 - 15.0 g/dL 8.3  7.0  7.8   Hematocrit 36.0 - 46.0 % 25.8  23.2  26.3   Platelets 150 - 400 K/uL 362  442  388       CMP     Component Value Date/Time   NA 142 12/06/2023 0338   NA 143 09/24/2013 0409   K 4.0 12/06/2023 0338   K 3.6 09/24/2013 0409   CL 108 12/06/2023 0338   CL 108 (H) 09/24/2013 0409   CO2 23 12/06/2023 0338   CO2 27 09/24/2013 0409   GLUCOSE 69 (L) 12/06/2023 0338   GLUCOSE 222 (H) 09/24/2013 0409   BUN 18 12/06/2023 0338   BUN 7 09/24/2013 0409   CREATININE 0.55 12/06/2023 0338   CREATININE 0.77 09/24/2013 0409   CALCIUM  8.1 (L) 12/06/2023 0338   CALCIUM  7.7 (L) 09/24/2013 0409   PROT 8.6 (H) 12/04/2023 1211   PROT 5.4 (L) 09/22/2013 0507   ALBUMIN  2.1 (L) 12/06/2023 0338   ALBUMIN  2.3 (L) 09/22/2013 0507   AST 21 12/04/2023 1211   AST 19 09/22/2013 0507   ALT 10 12/04/2023 1211   ALT 13 (L) 09/22/2013 0507   ALKPHOS 85 12/04/2023 1211   ALKPHOS 75 09/22/2013 0507   BILITOT 0.7 12/04/2023 1211   BILITOT 0.3 09/22/2013 0507   GFRNONAA >60 12/06/2023 0338   GFRNONAA >60 09/24/2013 0409     No results found.     Assessment & Plan:   1. Unilateral AKA, left (HCC) (Primary)    Assessment and Plan Assessment & Plan Healing postoperative wound of left above-knee amputation Incision intact, no infection, minor concern at incision end, overall healing well. - Technical Brewer. - Scheduled follow-up in a few weeks to assess healing.     Current Outpatient Medications on File Prior to Visit  Medication Sig Dispense Refill    acetaminophen  (TYLENOL ) 325 MG tablet Take 2 tablets (650 mg total) by mouth every 6 (six) hours as needed for mild pain, moderate pain, fever or headache. 60 tablet 0   Amino Acids -Protein Hydrolys (FEEDING SUPPLEMENT, PRO-STAT SUGAR FREE 64,) LIQD Take 30 mLs by mouth in the morning and at bedtime.     ascorbic acid  (VITAMIN C ) 500 MG tablet Take 500 mg by mouth 2 (two) times daily.     aspirin  81 MG chewable tablet Chew 81 mg by mouth daily.     atorvastatin  (LIPITOR ) 80 MG tablet Take 1 tablet (80 mg total) by mouth daily. 30 tablet 0   bisacodyl  (DULCOLAX) 10 MG suppository Place 1 suppository (10 mg total) rectally daily as needed for severe constipation.     cholecalciferol (VITAMIN D3) 25 MCG (1000 UNIT) tablet Take 5,000 Units by mouth every Monday.     clopidogrel  (PLAVIX ) 75 MG tablet Take 1 tablet (75 mg total) by mouth daily. 30 tablet 3   cyanocobalamin  (VITAMIN B12) 1000 MCG tablet Take 1,000 mcg by mouth daily.     feeding supplement, GLUCERNA SHAKE, (GLUCERNA SHAKE) LIQD Take 237 mLs by mouth 3 (three) times daily between meals.     ferrous sulfate  220 (44 Fe) MG/5ML solution Take 220 mg by mouth every other day.     geriatric multivitamins-minerals (ELDERTONIC/GEVRABON) LIQD Take 15 mLs by mouth daily.     Glucagon , rDNA, (GLUCAGON  EMERGENCY) 1 MG KIT Inject 1 mg into the muscle once as needed (low blood sugar).     hydrALAZINE  (APRESOLINE ) 25 MG tablet Take 1 tablet (25 mg total) by mouth 3 (three) times daily. 90 tablet 1   insulin  aspart (NOVOLOG ) 100 UNIT/ML injection CBG 70 - 120: 0 units CBG 121 - 150: 2 units CBG 151 - 200: 3  units CBG 201 - 250: 5 units CBG 251 - 300: 8 units CBG 301 - 350: 11 units CBG 351 - 400: 15 units CBG > 400: call MD     lisinopril  (ZESTRIL ) 20 MG tablet Take 20 mg by mouth daily.     magnesium  oxide (MAG-OX) 400 (240 Mg) MG tablet Take 400 mg by mouth daily.     naloxone (NARCAN) nasal spray 4 mg/0.1 mL Place 1 spray into the nose 3 (three) times  daily as needed (opioid overodse).     oxyCODONE  (OXY IR/ROXICODONE ) 5 MG immediate release tablet Take 1 tablet (5 mg total) by mouth 3 (three) times daily as needed. 10 tablet 0   polyethylene glycol (MIRALAX  / GLYCOLAX ) 17 g packet Take 17 g by mouth daily.     SANTYL  250 UNIT/GM ointment Apply 1 Application topically daily.     No current facility-administered medications on file prior to visit.    There are no Patient Instructions on file for this visit. No follow-ups on file.   Maurya Nethery E Jihan Mellette, NP

## 2024-01-15 ENCOUNTER — Encounter: Admitting: Physician Assistant

## 2024-01-22 ENCOUNTER — Encounter: Admitting: Internal Medicine

## 2024-01-29 ENCOUNTER — Encounter: Attending: Internal Medicine | Admitting: Internal Medicine

## 2024-01-29 DIAGNOSIS — I7389 Other specified peripheral vascular diseases: Secondary | ICD-10-CM | POA: Insufficient documentation

## 2024-01-29 DIAGNOSIS — Y838 Other surgical procedures as the cause of abnormal reaction of the patient, or of later complication, without mention of misadventure at the time of the procedure: Secondary | ICD-10-CM | POA: Diagnosis not present

## 2024-01-29 DIAGNOSIS — E11621 Type 2 diabetes mellitus with foot ulcer: Secondary | ICD-10-CM | POA: Diagnosis not present

## 2024-01-29 DIAGNOSIS — Z09 Encounter for follow-up examination after completed treatment for conditions other than malignant neoplasm: Secondary | ICD-10-CM | POA: Insufficient documentation

## 2024-01-29 DIAGNOSIS — L97128 Non-pressure chronic ulcer of left thigh with other specified severity: Secondary | ICD-10-CM | POA: Diagnosis not present

## 2024-01-29 DIAGNOSIS — F0150 Vascular dementia without behavioral disturbance: Secondary | ICD-10-CM | POA: Diagnosis not present

## 2024-01-29 DIAGNOSIS — T8131XA Disruption of external operation (surgical) wound, not elsewhere classified, initial encounter: Secondary | ICD-10-CM | POA: Insufficient documentation

## 2024-02-01 ENCOUNTER — Ambulatory Visit (INDEPENDENT_AMBULATORY_CARE_PROVIDER_SITE_OTHER): Admitting: Nurse Practitioner

## 2024-02-08 ENCOUNTER — Encounter (INDEPENDENT_AMBULATORY_CARE_PROVIDER_SITE_OTHER): Payer: Self-pay | Admitting: Nurse Practitioner

## 2024-02-08 ENCOUNTER — Ambulatory Visit (INDEPENDENT_AMBULATORY_CARE_PROVIDER_SITE_OTHER): Admitting: Nurse Practitioner

## 2024-02-08 VITALS — BP 136/81 | HR 70 | Resp 18

## 2024-02-08 DIAGNOSIS — I7025 Atherosclerosis of native arteries of other extremities with ulceration: Secondary | ICD-10-CM

## 2024-02-08 DIAGNOSIS — Z89612 Acquired absence of left leg above knee: Secondary | ICD-10-CM

## 2024-02-08 NOTE — Progress Notes (Signed)
 "  Subjective:    Patient ID: [name], female    DOB: 14-May-1933, 89 y.o.   MRN: [id number] Chief Complaint  Patient presents with   Follow-up    4 week follow up no studies    HPI  Discussed the use of AI scribe software for clinical note transcription with the patient, who gave verbal consent to proceed.  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  She completed wound care last week following successful healing of the left above-knee amputation site, performed in late September 2025 for stable gangrene and infection. The surgical wound is now closed, and she has not experienced pain since the procedure, describing her current status as significantly improved compared to prior to surgery.  Prior to amputation, she was wheelchair-bound due to left lower extremity contracture following stroke and was unable to ambulate. She underwent left second toe amputation in March 2025 for necrosis and gangrene, with subsequent wound care for the heel and foot. At her final wound care appointment in September 2025, the wound was noted to be infected, prompting emergency above-knee amputation.  She remains wheelchair-dependent and does not ambulate. There is uncertainty regarding her strength for prosthetic use, and she continues to rely on a wheelchair for mobility. No new symptoms or pain have developed at the amputation site, and no further wound care or surgical intervention has been required since the procedure.   Review of Systems  Neurological:  Positive for weakness.  All other systems reviewed and are negative.      Objective:   Physical Exam Vitals reviewed.  HENT:     Head: Normocephalic.  Cardiovascular:     Rate and Rhythm: Normal rate.  Pulmonary:     Effort: Pulmonary effort is  normal.  Musculoskeletal:     Left Lower Extremity: Left leg is amputated above knee.  Skin:    General: Skin is warm and dry.  Neurological:     Mental Status: She is alert and oriented to person, place, and time.  Psychiatric:        Mood and Affect: Mood normal.        Behavior: Behavior normal.        Thought Content: Thought content normal.    BP 136/81 (BP Location: Right Arm, Patient Position: Sitting, Cuff Size: Normal)   Pulse 70   Resp 18   Past Medical History:  Diagnosis Date   Carpal tunnel syndrome on both sides    CVA (cerebral vascular accident) (HCC) 11/14/2019   Diabetes (HCC)    Diabetic polyneuropathy (HCC)    Diverticulitis    Hyperlipidemia    Hypertension    Osteoarthritis    Sepsis (HCC) 03/22/2018   Suspected UTI 04/09/2023   UTI (urinary tract infection) 09/20/2019   Vaginal prolapse     Social History   Socioeconomic History   Marital status: Widowed    Spouse name: Not on file   Number of children: Not on file   Years of education: Not on file   Highest education level: Not on file  Occupational History   Not on file  Tobacco Use   Smoking status: Never   Smokeless tobacco: Never  Vaping Use   Vaping status: Never Used  Substance and Sexual Activity   Alcohol  use: No   Drug use: No  Sexual activity: Not Currently  Other Topics Concern   Not on file  Social History Narrative   Lives at home by herself. Ambulates with a cane.   Social Drivers of Health   Tobacco Use: Low Risk (02/08/2024)   Patient History    Smoking Tobacco Use: Never    Smokeless Tobacco Use: Never    Passive Exposure: Not on file  Financial Resource Strain: Not on file  Food Insecurity: Patient Unable To Answer (12/05/2023)   Epic    Worried About Programme Researcher, Broadcasting/film/video in the Last Year: Patient unable to answer    Ran Out of Food in the Last Year: Patient unable to answer  Transportation Needs: Patient Unable To Answer (12/05/2023)   Epic    Lack of  Transportation (Medical): Patient unable to answer    Lack of Transportation (Non-Medical): Patient unable to answer  Physical Activity: Not on file  Stress: Not on file  Social Connections: Patient Unable To Answer (12/05/2023)   Social Connection and Isolation Panel    Frequency of Communication with Friends and Family: Patient unable to answer    Frequency of Social Gatherings with Friends and Family: Patient unable to answer    Attends Religious Services: Patient unable to answer    Active Member of Clubs or Organizations: Patient unable to answer    Attends Banker Meetings: Patient unable to answer    Marital Status: Patient unable to answer  Intimate Partner Violence: Patient Unable To Answer (12/05/2023)   Epic    Fear of Current or Ex-Partner: Patient unable to answer    Emotionally Abused: Patient unable to answer    Physically Abused: Patient unable to answer    Sexually Abused: Patient unable to answer  Depression (PHQ2-9): Not on file  Alcohol  Screen: Not on file  Housing: Unknown (12/05/2023)   Epic    Unable to Pay for Housing in the Last Year: Patient unable to answer    Number of Times Moved in the Last Year: 0    Homeless in the Last Year: Patient unable to answer  Utilities: Patient Unable To Answer (12/05/2023)   Epic    Threatened with loss of utilities: Patient unable to answer  Health Literacy: Not on file    Past Surgical History:  Procedure Laterality Date   AMPUTATION Left 05/01/2023   Procedure: AMPUTATION, FOOT, RAY;  Surgeon: Lennie Barter, DPM;  Location: [facility];  Service: Orthopedics/Podiatry;  Laterality: Left;  LEFT PARTIAL FIRST RAY   AMPUTATION Left 12/09/2023   Procedure: AMPUTATION, ABOVE KNEE;  Surgeon: Marea Selinda RAMAN, MD;  Location: [facility];  Service: Vascular;  Laterality: Left;   AMPUTATION TOE Left 08/13/2022   Procedure: EXCISION OF 5TH RAY;  Surgeon: Ashley Soulier, DPM;  Location: [facility];  Service: Orthopedics/Podiatry;   Laterality: Left;   CESAREAN SECTION     IRRIGATION AND DEBRIDEMENT FOOT Left 01/09/2022   Procedure: IRRIGATION AND DEBRIDEMENT FOOT WITH BONE BIOPSY;  Surgeon: Ashley Soulier, DPM;  Location: [facility];  Service: Podiatry;  Laterality: Left;   LOWER EXTREMITY ANGIOGRAPHY Left 06/11/2021   Procedure: Lower Extremity Angiography;  Surgeon: Jama Cordella MATSU, MD;  Location: [facility];  Service: Cardiovascular;  Laterality: Left;   LOWER EXTREMITY ANGIOGRAPHY Left 08/11/2022   Procedure: Lower Extremity Angiography;  Surgeon: Jama Cordella MATSU, MD;  Location: [facility];  Service: Cardiovascular;  Laterality: Left;   LOWER EXTREMITY ANGIOGRAPHY Left 04/30/2023   Procedure: Lower Extremity Angiography;  Surgeon: Marea Selinda RAMAN, MD;  Location: [facility];  Service: Cardiovascular;  Laterality: Left;   LOWER EXTREMITY ANGIOGRAPHY Left 11/06/2023   Procedure: Lower Extremity Angiography;  Surgeon: Jama Cordella MATSU, MD;  Location: [facility];  Service: Cardiovascular;  Laterality: Left;   pessary     REPLACEMENT TOTAL KNEE BILATERAL     VENTRAL HERNIA REPAIR     VENTRAL HERNIA REPAIR N/A 01/13/2016   Procedure: HERNIA REPAIR VENTRAL ADULT WITH SMALL BOWEL RESECTION, LYSIS OF ADHESIONS;  Surgeon: Dorothyann LITTIE Husk, MD;  Location: [facility];  Service: General;  Laterality: N/A;    Family History  Problem Relation Age of Onset   Breast cancer Mother 54   Breast cancer Maternal Aunt     Allergies[1]     Latest Ref Rng & Units 12/11/2023    5:27 AM 12/10/2023    4:13 AM 12/06/2023    3:38 AM  CBC  WBC 4.0 - 10.5 K/uL 14.5  18.0  14.5   Hemoglobin 12.0 - 15.0 g/dL 8.3  7.0  7.8   Hematocrit 36.0 - 46.0 % 25.8  23.2  26.3   Platelets 150 - 400 K/uL 362  442  388       CMP     Component Value Date/Time   NA 142 12/06/2023 0338   NA 143 09/24/2013 0409   K 4.0 12/06/2023 0338   K 3.6 09/24/2013 0409   CL 108 12/06/2023 0338   CL 108 (H) 09/24/2013  0409   CO2 23 12/06/2023 0338   CO2 27 09/24/2013 0409   GLUCOSE 69 (L) 12/06/2023 0338   GLUCOSE 222 (H) 09/24/2013 0409   BUN 18 12/06/2023 0338   BUN 7 09/24/2013 0409   CREATININE 0.55 12/06/2023 0338   CREATININE 0.77 09/24/2013 0409   CALCIUM  8.1 (L) 12/06/2023 0338   CALCIUM  7.7 (L) 09/24/2013 0409   PROT 8.6 (H) 12/04/2023 1211   PROT 5.4 (L) 09/22/2013 0507   ALBUMIN  2.1 (L) 12/06/2023 0338   ALBUMIN  2.3 (L) 09/22/2013 0507   AST 21 12/04/2023 1211   AST 19 09/22/2013 0507   ALT 10 12/04/2023 1211   ALT 13 (L) 09/22/2013 0507   ALKPHOS 85 12/04/2023 1211   ALKPHOS 75 09/22/2013 0507   BILITOT 0.7 12/04/2023 1211   BILITOT 0.3 09/22/2013 0507   GFRNONAA >60 12/06/2023 0338   GFRNONAA >60 09/24/2013 0409     No results found.     Assessment & Plan:   1. Atherosclerosis of native arteries of the extremities with ulceration (HCC) (Primary) Atherosclerosis of right lower extremity Atherosclerosis of the right lower extremity. Ongoing surveillance warranted due to prior complications with the left leg. Currently asymptomatic. - Scheduled follow-up in three months to assess right lower extremity perfusion, including repeat ankle-brachial index. - VAS US  ABI WITH/WO TBI; Future   2. Hx of AKA (above knee amputation), left (HCC) Atherosclerosis of lower extremities with gangrene and above-knee amputation, left Status post left above-knee amputation for gangrene, infection, and contracture. Surgical site well-healed. Wheelchair-dependent, not a prosthesis candidate due to limited strength and mobility. - No further surgical intervention required. - No additional wound care needed.   Assessment and Plan Assessment & Plan        Medications Ordered Prior to Encounter[2]  There are no Patient Instructions on file for this visit. Return in about 3 months (around 05/08/2024) for with ABIs, see GS/APP.   Jolyssa Oplinger E Drequan Ironside, NP      [  1]  Allergies Allergen  Reactions   Metformin  And Related Nausea And Vomiting  [2]  Current Outpatient Medications on File Prior to Visit  Medication Sig Dispense Refill   acetaminophen  (TYLENOL ) 325 MG tablet Take 2 tablets (650 mg total) by mouth every 6 (six) hours as needed for mild pain, moderate pain, fever or headache. 60 tablet 0   Amino Acids -Protein Hydrolys (FEEDING SUPPLEMENT, PRO-STAT SUGAR FREE 64,) LIQD Take 30 mLs by mouth in the morning and at bedtime.     ascorbic acid  (VITAMIN C ) 500 MG tablet Take 500 mg by mouth 2 (two) times daily.     aspirin  81 MG chewable tablet Chew 81 mg by mouth daily.     atorvastatin  (LIPITOR ) 80 MG tablet Take 1 tablet (80 mg total) by mouth daily. 30 tablet 0   bisacodyl  (DULCOLAX) 10 MG suppository Place 1 suppository (10 mg total) rectally daily as needed for severe constipation.     cholecalciferol (VITAMIN D3) 25 MCG (1000 UNIT) tablet Take 5,000 Units by mouth every Monday.     clopidogrel  (PLAVIX ) 75 MG tablet Take 1 tablet (75 mg total) by mouth daily. 30 tablet 3   cyanocobalamin  (VITAMIN B12) 1000 MCG tablet Take 1,000 mcg by mouth daily.     feeding supplement, GLUCERNA SHAKE, (GLUCERNA SHAKE) LIQD Take 237 mLs by mouth 3 (three) times daily between meals.     ferrous sulfate  220 (44 Fe) MG/5ML solution Take 220 mg by mouth every other day.     geriatric multivitamins-minerals (ELDERTONIC/GEVRABON) LIQD Take 15 mLs by mouth daily.     Glucagon , rDNA, (GLUCAGON  EMERGENCY) 1 MG KIT Inject 1 mg into the muscle once as needed (low blood sugar).     hydrALAZINE  (APRESOLINE ) 25 MG tablet Take 1 tablet (25 mg total) by mouth 3 (three) times daily. 90 tablet 1   insulin  aspart (NOVOLOG ) 100 UNIT/ML injection CBG 70 - 120: 0 units CBG 121 - 150: 2 units CBG 151 - 200: 3 units CBG 201 - 250: 5 units CBG 251 - 300: 8 units CBG 301 - 350: 11 units CBG 351 - 400: 15 units CBG > 400: call MD     lisinopril  (ZESTRIL ) 20 MG tablet Take 20 mg by mouth daily.     magnesium  oxide  (MAG-OX) 400 (240 Mg) MG tablet Take 400 mg by mouth daily.     naloxone (NARCAN) nasal spray 4 mg/0.1 mL Place 1 spray into the nose 3 (three) times daily as needed (opioid overodse).     oxyCODONE  (OXY IR/ROXICODONE ) 5 MG immediate release tablet Take 1 tablet (5 mg total) by mouth 3 (three) times daily as needed. 10 tablet 0   polyethylene glycol (MIRALAX  / GLYCOLAX ) 17 g packet Take 17 g by mouth daily.     SANTYL  250 UNIT/GM ointment Apply 1 Application topically daily.     No current facility-administered medications on file prior to visit.   "

## 2024-03-01 ENCOUNTER — Inpatient Hospital Stay: Admission: EM | Admit: 2024-03-01 | Discharge: 2024-03-05 | DRG: 871 | Disposition: A | Source: Skilled Nursing Facility

## 2024-03-01 ENCOUNTER — Other Ambulatory Visit: Payer: Self-pay

## 2024-03-01 ENCOUNTER — Emergency Department

## 2024-03-01 DIAGNOSIS — N3000 Acute cystitis without hematuria: Secondary | ICD-10-CM

## 2024-03-01 DIAGNOSIS — Z794 Long term (current) use of insulin: Secondary | ICD-10-CM

## 2024-03-01 DIAGNOSIS — D62 Acute posthemorrhagic anemia: Secondary | ICD-10-CM | POA: Diagnosis not present

## 2024-03-01 DIAGNOSIS — R7881 Bacteremia: Secondary | ICD-10-CM | POA: Diagnosis not present

## 2024-03-01 DIAGNOSIS — Z681 Body mass index (BMI) 19 or less, adult: Secondary | ICD-10-CM

## 2024-03-01 DIAGNOSIS — E860 Dehydration: Secondary | ICD-10-CM | POA: Diagnosis not present

## 2024-03-01 DIAGNOSIS — Z888 Allergy status to other drugs, medicaments and biological substances status: Secondary | ICD-10-CM

## 2024-03-01 DIAGNOSIS — F0390 Unspecified dementia without behavioral disturbance: Secondary | ICD-10-CM | POA: Diagnosis present

## 2024-03-01 DIAGNOSIS — I739 Peripheral vascular disease, unspecified: Secondary | ICD-10-CM | POA: Diagnosis present

## 2024-03-01 DIAGNOSIS — A414 Sepsis due to anaerobes: Principal | ICD-10-CM | POA: Diagnosis present

## 2024-03-01 DIAGNOSIS — E1142 Type 2 diabetes mellitus with diabetic polyneuropathy: Secondary | ICD-10-CM | POA: Diagnosis present

## 2024-03-01 DIAGNOSIS — Z66 Do not resuscitate: Secondary | ICD-10-CM | POA: Diagnosis present

## 2024-03-01 DIAGNOSIS — K651 Peritoneal abscess: Secondary | ICD-10-CM | POA: Diagnosis present

## 2024-03-01 DIAGNOSIS — E872 Acidosis, unspecified: Secondary | ICD-10-CM | POA: Diagnosis present

## 2024-03-01 DIAGNOSIS — Z89612 Acquired absence of left leg above knee: Secondary | ICD-10-CM | POA: Diagnosis not present

## 2024-03-01 DIAGNOSIS — E43 Unspecified severe protein-calorie malnutrition: Secondary | ICD-10-CM | POA: Diagnosis present

## 2024-03-01 DIAGNOSIS — Z803 Family history of malignant neoplasm of breast: Secondary | ICD-10-CM

## 2024-03-01 DIAGNOSIS — K8309 Other cholangitis: Secondary | ICD-10-CM | POA: Diagnosis present

## 2024-03-01 DIAGNOSIS — N179 Acute kidney failure, unspecified: Secondary | ICD-10-CM | POA: Diagnosis present

## 2024-03-01 DIAGNOSIS — E876 Hypokalemia: Secondary | ICD-10-CM | POA: Diagnosis not present

## 2024-03-01 DIAGNOSIS — Z515 Encounter for palliative care: Secondary | ICD-10-CM

## 2024-03-01 DIAGNOSIS — N390 Urinary tract infection, site not specified: Secondary | ICD-10-CM | POA: Diagnosis present

## 2024-03-01 DIAGNOSIS — K5720 Diverticulitis of large intestine with perforation and abscess without bleeding: Secondary | ICD-10-CM | POA: Diagnosis present

## 2024-03-01 DIAGNOSIS — Z7902 Long term (current) use of antithrombotics/antiplatelets: Secondary | ICD-10-CM

## 2024-03-01 DIAGNOSIS — E870 Hyperosmolality and hypernatremia: Secondary | ICD-10-CM | POA: Diagnosis present

## 2024-03-01 DIAGNOSIS — I10 Essential (primary) hypertension: Secondary | ICD-10-CM | POA: Diagnosis present

## 2024-03-01 DIAGNOSIS — G9341 Metabolic encephalopathy: Secondary | ICD-10-CM | POA: Diagnosis present

## 2024-03-01 DIAGNOSIS — E1151 Type 2 diabetes mellitus with diabetic peripheral angiopathy without gangrene: Secondary | ICD-10-CM | POA: Diagnosis present

## 2024-03-01 DIAGNOSIS — K751 Phlebitis of portal vein: Secondary | ICD-10-CM | POA: Diagnosis not present

## 2024-03-01 DIAGNOSIS — Z79899 Other long term (current) drug therapy: Secondary | ICD-10-CM

## 2024-03-01 DIAGNOSIS — R6520 Severe sepsis without septic shock: Secondary | ICD-10-CM | POA: Diagnosis present

## 2024-03-01 DIAGNOSIS — R627 Adult failure to thrive: Secondary | ICD-10-CM | POA: Diagnosis not present

## 2024-03-01 DIAGNOSIS — Z7982 Long term (current) use of aspirin: Secondary | ICD-10-CM

## 2024-03-01 DIAGNOSIS — I81 Portal vein thrombosis: Secondary | ICD-10-CM | POA: Diagnosis present

## 2024-03-01 DIAGNOSIS — I2510 Atherosclerotic heart disease of native coronary artery without angina pectoris: Secondary | ICD-10-CM | POA: Diagnosis present

## 2024-03-01 DIAGNOSIS — I809 Phlebitis and thrombophlebitis of unspecified site: Secondary | ICD-10-CM

## 2024-03-01 DIAGNOSIS — I808 Phlebitis and thrombophlebitis of other sites: Secondary | ICD-10-CM | POA: Diagnosis not present

## 2024-03-01 DIAGNOSIS — E782 Mixed hyperlipidemia: Secondary | ICD-10-CM | POA: Diagnosis present

## 2024-03-01 DIAGNOSIS — Z96651 Presence of right artificial knee joint: Secondary | ICD-10-CM | POA: Diagnosis present

## 2024-03-01 DIAGNOSIS — B966 Bacteroides fragilis [B. fragilis] as the cause of diseases classified elsewhere: Secondary | ICD-10-CM | POA: Diagnosis present

## 2024-03-01 DIAGNOSIS — Z8673 Personal history of transient ischemic attack (TIA), and cerebral infarction without residual deficits: Secondary | ICD-10-CM

## 2024-03-01 LAB — URINALYSIS, ROUTINE W REFLEX MICROSCOPIC
Bilirubin Urine: NEGATIVE
Glucose, UA: NEGATIVE mg/dL
Hgb urine dipstick: NEGATIVE
Ketones, ur: NEGATIVE mg/dL
Nitrite: NEGATIVE
Protein, ur: 100 mg/dL — AB
Specific Gravity, Urine: 1.023 (ref 1.005–1.030)
Squamous Epithelial / HPF: 0 /HPF (ref 0–5)
pH: 5 (ref 5.0–8.0)

## 2024-03-01 LAB — CBC
HCT: 28.3 % — ABNORMAL LOW (ref 36.0–46.0)
Hemoglobin: 8.2 g/dL — ABNORMAL LOW (ref 12.0–15.0)
MCH: 29.4 pg (ref 26.0–34.0)
MCHC: 29 g/dL — ABNORMAL LOW (ref 30.0–36.0)
MCV: 101.4 fL — ABNORMAL HIGH (ref 80.0–100.0)
Platelets: 413 K/uL — ABNORMAL HIGH (ref 150–400)
RBC: 2.79 MIL/uL — ABNORMAL LOW (ref 3.87–5.11)
RDW: 16.7 % — ABNORMAL HIGH (ref 11.5–15.5)
WBC: 16.5 K/uL — ABNORMAL HIGH (ref 4.0–10.5)
nRBC: 0 % (ref 0.0–0.2)

## 2024-03-01 LAB — BASIC METABOLIC PANEL WITH GFR
Anion gap: 16 — ABNORMAL HIGH (ref 5–15)
BUN: 65 mg/dL — ABNORMAL HIGH (ref 8–23)
CO2: 25 mmol/L (ref 22–32)
Calcium: 9.1 mg/dL (ref 8.9–10.3)
Chloride: 119 mmol/L — ABNORMAL HIGH (ref 98–111)
Creatinine, Ser: 1.36 mg/dL — ABNORMAL HIGH (ref 0.44–1.00)
GFR, Estimated: 37 mL/min — ABNORMAL LOW
Glucose, Bld: 175 mg/dL — ABNORMAL HIGH (ref 70–99)
Potassium: 4.2 mmol/L (ref 3.5–5.1)
Sodium: 161 mmol/L (ref 135–145)

## 2024-03-01 LAB — CBG MONITORING, ED: Glucose-Capillary: 131 mg/dL — ABNORMAL HIGH (ref 70–99)

## 2024-03-01 LAB — MAGNESIUM: Magnesium: 3.5 mg/dL — ABNORMAL HIGH (ref 1.7–2.4)

## 2024-03-01 MED ORDER — LACTATED RINGERS IV BOLUS
1000.0000 mL | Freq: Once | INTRAVENOUS | Status: AC
  Administered 2024-03-01: 1000 mL via INTRAVENOUS

## 2024-03-01 MED ORDER — ACETAMINOPHEN 325 MG PO TABS: 650.0000 mg | ORAL_TABLET | Freq: Four times a day (QID) | ORAL | Status: DC | PRN

## 2024-03-01 MED ORDER — SODIUM CHLORIDE 0.9 % IV BOLUS
500.0000 mL | Freq: Once | INTRAVENOUS | Status: AC
  Administered 2024-03-01: 500 mL via INTRAVENOUS

## 2024-03-01 MED ORDER — ONDANSETRON HCL 4 MG/2ML IJ SOLN: 4.0000 mg | Freq: Four times a day (QID) | INTRAMUSCULAR | Status: DC | PRN

## 2024-03-01 MED ORDER — HEPARIN SODIUM (PORCINE) 5000 UNIT/ML IJ SOLN
5000.0000 [IU] | Freq: Three times a day (TID) | INTRAMUSCULAR | Status: DC
  Administered 2024-03-01 – 2024-03-02 (×2): 5000 [IU] via SUBCUTANEOUS
  Filled 2024-03-01 (×2): qty 1

## 2024-03-01 MED ORDER — LACTATED RINGERS IV SOLN: INTRAVENOUS | Status: DC

## 2024-03-01 MED ORDER — SENNOSIDES-DOCUSATE SODIUM 8.6-50 MG PO TABS: 1.0000 | ORAL_TABLET | Freq: Every evening | ORAL | Status: DC | PRN

## 2024-03-01 MED ORDER — INSULIN ASPART 100 UNIT/ML IJ SOLN
0.0000 [IU] | Freq: Three times a day (TID) | INTRAMUSCULAR | Status: DC
  Administered 2024-03-02 (×3): 1 [IU] via SUBCUTANEOUS
  Administered 2024-03-03 – 2024-03-04 (×4): 3 [IU] via SUBCUTANEOUS
  Administered 2024-03-04: 1 [IU] via SUBCUTANEOUS
  Administered 2024-03-04: 3 [IU] via SUBCUTANEOUS
  Filled 2024-03-01: qty 3
  Filled 2024-03-01: qty 1
  Filled 2024-03-01 (×2): qty 3
  Filled 2024-03-01: qty 1
  Filled 2024-03-01: qty 3
  Filled 2024-03-01: qty 1

## 2024-03-01 MED ORDER — SODIUM CHLORIDE 0.9% FLUSH
3.0000 mL | Freq: Two times a day (BID) | INTRAVENOUS | Status: DC
  Administered 2024-03-01 – 2024-03-05 (×7): 3 mL via INTRAVENOUS

## 2024-03-01 MED ORDER — SODIUM CHLORIDE 0.9 % IV SOLN
1.0000 g | INTRAVENOUS | Status: DC
  Administered 2024-03-02 – 2024-03-03 (×2): 1 g via INTRAVENOUS
  Filled 2024-03-01 (×2): qty 10

## 2024-03-01 MED ORDER — ACETAMINOPHEN 650 MG RE SUPP
650.0000 mg | Freq: Four times a day (QID) | RECTAL | Status: DC | PRN
  Administered 2024-03-03: 650 mg via RECTAL
  Filled 2024-03-01: qty 1

## 2024-03-01 MED ORDER — SODIUM CHLORIDE 0.9 % IV SOLN
1.0000 g | Freq: Once | INTRAVENOUS | Status: AC
  Administered 2024-03-01: 1 g via INTRAVENOUS
  Filled 2024-03-01: qty 10

## 2024-03-01 MED ORDER — ONDANSETRON HCL 4 MG PO TABS: 4.0000 mg | ORAL_TABLET | Freq: Four times a day (QID) | ORAL | Status: DC | PRN

## 2024-03-01 NOTE — ED Notes (Signed)
 This RN attempted to reach patients legal guardian ([name]) x2 with no success. Voicemail left with instructions to call back at earliest convenience.

## 2024-03-01 NOTE — ED Triage Notes (Signed)
 Arrived by [facility] from [facility]. Staff called EMS due to unresponsive. EMS reports upon arrival patient is alert.  Staff reports last ate yesterday but EMS reports seeing food in her mouth.   EMS vitals: 229CBG 116/37 b/p 98% RA 100HR 98.7oral  History dementia   DSS is patients legal guardian. Staff reported to EMS that no family is allowed to know medical information

## 2024-03-01 NOTE — ED Notes (Signed)
 IV team at the bedside.

## 2024-03-01 NOTE — ED Provider Notes (Signed)
 "  [facility] Provider Note    Event Date/Time   First MD Initiated Contact with Patient 03/01/24 1456     (approximate)   History   evaluation   HPI  [name] is a 89 y.o. female with a past medical history of dementia, diabetes, status post left AKA, CVA, PAD, presenting to the emergency department via EMS from [facility] for a reported episode of unresponsiveness.  When EMS arrived they reported that the patient was awake and alert staff had reported to EMS that the patient had last ate yesterday but EMS reported that they saw food in the patient's mouth.  Patient denies any complaints at this time.SABRA    Physical Exam   Triage Vital Signs: ED Triage Vitals  Encounter Vitals Group     BP 03/01/24 1238 114/68     Girls Systolic BP Percentile --      Girls Diastolic BP Percentile --      Boys Systolic BP Percentile --      Boys Diastolic BP Percentile --      Pulse Rate 03/01/24 1238 (!) 112     Resp 03/01/24 1238 18     Temp 03/01/24 1238 98.5 F (36.9 C)     Temp src --      SpO2 03/01/24 1238 98 %     Weight 03/01/24 1230 120 lb (54.4 kg)     Height 03/01/24 1230 5' 4 (1.626 m)     Head Circumference --      Peak Flow --      Pain Score --      Pain Loc --      Pain Education --      Exclude from Growth Chart --     Most recent vital signs: Vitals:   03/01/24 1238  BP: 114/68  Pulse: (!) 112  Resp: 18  Temp: 98.5 F (36.9 C)  SpO2: 98%     General: Awake, no distress.  CV:  Good peripheral perfusion.  Resp:  Normal effort.  Abd:  No distention.  Other:  Oriented to person but not to place or time   ED Results / Procedures / Treatments   Labs (all labs ordered are listed, but only abnormal results are displayed) Labs Reviewed  CBC  BASIC METABOLIC PANEL WITH GFR  MAGNESIUM   URINALYSIS, ROUTINE W REFLEX MICROSCOPIC     EKG     RADIOLOGY I, Reche CHRISTELLA Leventhal, personally viewed and evaluated these  images (plain radiographs) as part of my medical decision making, as well as reviewing the written report by the radiologist.  No acute pathology on chest x-ray.    PROCEDURES:  Critical Care performed:   CRITICAL CARE Performed by: Reche CHRISTELLA Leventhal   Total critical care time: 30 minutes  Critical care time was exclusive of separately billable procedures and treating other patients.  Critical care was necessary to treat or prevent imminent or life-threatening deterioration.  Critical care was time spent personally by me on the following activities: development of treatment plan with patient and/or surrogate as well as nursing, discussions with consultants, evaluation of patient's response to treatment, examination of patient, obtaining history from patient or surrogate, ordering and performing treatments and interventions, ordering and review of laboratory studies, ordering and review of radiographic studies, pulse oximetry and re-evaluation of patient's condition.   Procedures   MEDICATIONS ORDERED IN ED: Medications - No data to display   IMPRESSION / MDM / ASSESSMENT AND  PLAN / ED COURSE  I reviewed the triage vital signs and the nursing notes.                              Differential diagnosis includes, but is not limited to, alcohol , illicit or prescription medications, or other toxic ingestion; intracranial pathology such as stroke or intracerebral hemorrhage; fever or infectious causes including sepsis; hypoxemia and/or hypercarbia; uremia; trauma; endocrine related disorders such as diabetes, hypoglycemia, and thyroid -related diseases; hypertensive encephalopathy; etc.   Patient's presentation is most consistent with acute presentation with potential threat to life or bodily function.  Patient is an 89 year old female with a past medical history of dementia, diabetes, status post left AKA, CVA, PAD, presenting to the emergency department via EMS from [facility]  for a reported episode of unresponsiveness.  EMS states that when they arrived at the facility the patient was awake and alert.  Patient is drowsy but alert at this time and denies any complaints.  She is oriented to person but not to place or time.  Plan IV fluids, chest x-ray, lab work for further evaluation.  Urinalysis shows findings concerning for UTI.  IV Rocephin  ordered.  CBC shows an elevated white blood cell count.  Her hemoglobin is also noted to be 8.5 which does appear to be around her baseline.  On BMP the patient's sodium level is 161.  Also noted to have an AKI.  I feel that the patient's elevated sodium is likely secondary to dehydration.  I discussed the patient's case with the hospitalist who is in agreement with plan for admission at this time.  They are aware of the patient's DNR status and limited interventions.     FINAL CLINICAL IMPRESSION(S) / ED DIAGNOSES   Final diagnoses:  Acute hypernatremia  Acute cystitis without hematuria     Rx / DC Orders   ED Discharge Orders     None        Note:  This document was prepared using Dragon voice recognition software and may include unintentional dictation errors.   [place] Reche HERO, MD 03/01/24 1801  "

## 2024-03-01 NOTE — ED Notes (Signed)
 Called lab to obtain CBC and BMP

## 2024-03-01 NOTE — H&P (Addendum)
 " History and Physical    [name] FMW:[id number] DOB: 1933/10/10 DOA: 03/01/2024  DOS: the patient was seen and examined on 03/01/2024  PCP: Rudolpho Norleen BIRCH, MD   Patient coming from: SNF  I have personally briefly reviewed patient's old medical records in [facility] and CareEverywhere  HPI:   [name] is a 89 y.o. year old female with medical history of hypertension, hyperlipidemia, type 2 diabetes, history of osteomyelitis status post left AKA, dementia presenting to the ED after having worsening confusion at the facility.  On my evaluation patient opens eyes to sternal rub but otherwise somnolent.  She does not answer to voice.  Per EDP, patient has legal guardian at DSS and per report her family should not be contacted.  I attempted to call the facility [facility] but was unsuccessful. On arrival to the ED patient was noted to be HDS stable.  Lab work and imaging obtained.  CBC with mild leukocytosis that is chronic and stable.  Mild anemia that is chronic and stable.  There is a slight microcytosis.  BMP with moderate hypernatremia at 161, AKI, non-anion gap metabolic acidosis.  Magnesium  checked and elevated mildly.  UA ordered which shows signs concerning for UTI.  Chest x-ray without any acute findings.  Given need for further care, [facility] contacted for admission.  Review of Systems: unable to review all systems due to the inability of the patient to answer questions.   Past Medical History:  Diagnosis Date   Carpal tunnel syndrome on both sides    CVA (cerebral vascular accident) (HCC) 11/14/2019   Diabetes (HCC)    Diabetic polyneuropathy (HCC)    Diverticulitis    Hyperlipidemia    Hypertension    Osteoarthritis    Sepsis (HCC) 03/22/2018   Suspected UTI 04/09/2023   UTI (urinary tract infection) 09/20/2019   Vaginal prolapse     Past Surgical History:  Procedure Laterality Date   AMPUTATION Left 05/01/2023   Procedure: AMPUTATION, FOOT, RAY;   Surgeon: Lennie Barter, DPM;  Location: [facility];  Service: Orthopedics/Podiatry;  Laterality: Left;  LEFT PARTIAL FIRST RAY   AMPUTATION Left 12/09/2023   Procedure: AMPUTATION, ABOVE KNEE;  Surgeon: Marea Selinda RAMAN, MD;  Location: [facility];  Service: Vascular;  Laterality: Left;   AMPUTATION TOE Left 08/13/2022   Procedure: EXCISION OF 5TH RAY;  Surgeon: Ashley Soulier, DPM;  Location: [facility];  Service: Orthopedics/Podiatry;  Laterality: Left;   CESAREAN SECTION     IRRIGATION AND DEBRIDEMENT FOOT Left 01/09/2022   Procedure: IRRIGATION AND DEBRIDEMENT FOOT WITH BONE BIOPSY;  Surgeon: Ashley Soulier, DPM;  Location: [facility];  Service: Podiatry;  Laterality: Left;   LOWER EXTREMITY ANGIOGRAPHY Left 06/11/2021   Procedure: Lower Extremity Angiography;  Surgeon: Jama Cordella KANDICE, MD;  Location: [facility];  Service: Cardiovascular;  Laterality: Left;   LOWER EXTREMITY ANGIOGRAPHY Left 08/11/2022   Procedure: Lower Extremity Angiography;  Surgeon: Jama Cordella KANDICE, MD;  Location: [facility];  Service: Cardiovascular;  Laterality: Left;   LOWER EXTREMITY ANGIOGRAPHY Left 04/30/2023   Procedure: Lower Extremity Angiography;  Surgeon: Marea Selinda RAMAN, MD;  Location: [facility];  Service: Cardiovascular;  Laterality: Left;   LOWER EXTREMITY ANGIOGRAPHY Left 11/06/2023   Procedure: Lower Extremity Angiography;  Surgeon: Jama Cordella KANDICE, MD;  Location: [facility];  Service: Cardiovascular;  Laterality: Left;   pessary     REPLACEMENT TOTAL KNEE BILATERAL     VENTRAL  HERNIA REPAIR     VENTRAL HERNIA REPAIR N/A 01/13/2016   Procedure: HERNIA REPAIR VENTRAL ADULT WITH SMALL BOWEL RESECTION, LYSIS OF ADHESIONS;  Surgeon: Dorothyann LITTIE Husk, MD;  Location: [facility];  Service: General;  Laterality: N/A;     Allergies[1]  Family History  Problem Relation Age of Onset   Breast cancer Mother 46   Breast cancer Maternal Aunt     Prior to Admission medications   Medication Sig Start Date End Date Taking? Authorizing Provider  acetaminophen  (TYLENOL ) 325 MG tablet Take 2 tablets (650 mg total) by mouth every 6 (six) hours as needed for mild pain, moderate pain, fever or headache. 01/25/16  Yes Loflin, Dorothyann LITTIE, MD  Amino Acids -Protein Hydrolys (FEEDING SUPPLEMENT, PRO-STAT SUGAR FREE 64,) LIQD Take 30 mLs by mouth in the morning and at bedtime.   Yes [provider]  ascorbic acid  (VITAMIN C ) 500 MG tablet Take 500 mg by mouth 2 (two) times daily.   Yes [provider]  aspirin  81 MG chewable tablet Chew 81 mg by mouth daily.   Yes [provider]  atorvastatin  (LIPITOR ) 80 MG tablet Take 1 tablet (80 mg total) by mouth daily. 09/26/19  Yes Regalado, Belkys A, MD  bisacodyl  (DULCOLAX) 10 MG suppository Place 1 suppository (10 mg total) rectally daily as needed for severe constipation. 05/04/23  Yes Alexander, Natalie, DO  cholecalciferol (VITAMIN D3) 25 MCG (1000 UNIT) tablet Take 5,000 Units by mouth daily.   Yes [provider]  clopidogrel  (PLAVIX ) 75 MG tablet Take 1 tablet (75 mg total) by mouth daily. 09/26/19  Yes Regalado, Belkys A, MD  cyanocobalamin  (VITAMIN B12) 1000 MCG tablet Take 1,000 mcg by mouth daily.   Yes [provider]  ferrous sulfate  220 (44 Fe) MG/5ML solution Take 220 mg by mouth every other day.   Yes [provider]  geriatric multivitamins-minerals (ELDERTONIC/GEVRABON) LIQD Take 15 mLs by mouth daily.   Yes [provider]  Glucagon , rDNA, (GLUCAGON  EMERGENCY) 1 MG KIT Inject 1 mg into the muscle once as needed (low blood sugar). 08/26/22  Yes [provider]  hydrALAZINE  (APRESOLINE ) 25 MG tablet Take 1 tablet (25 mg total) by mouth 3 (three) times daily. 12/12/23  Yes Patel, Sona, MD  insulin  aspart (NOVOLOG ) 100 UNIT/ML injection CBG 70 - 120: 0 units CBG 121 - 150: 2 units CBG 151 - 200: 3 units CBG 201 - 250: 5 units CBG 251 - 300: 8 units CBG 301 - 350:  11 units CBG 351 - 400: 15 units CBG > 400: call MD 05/04/23  Yes Alexander, Natalie, DO  lisinopril  (ZESTRIL ) 20 MG tablet Take 20 mg by mouth daily. 08/01/22  Yes [provider]  magnesium  oxide (MAG-OX) 400 (240 Mg) MG tablet Take 400 mg by mouth daily.   Yes [provider]  naloxone (NARCAN) nasal spray 4 mg/0.1 mL Place 1 spray into the nose 3 (three) times daily as needed (opioid overodse).   Yes [provider]  oxyCODONE  (OXY IR/ROXICODONE ) 5 MG immediate release tablet Take 1 tablet (5 mg total) by mouth 3 (three) times daily as needed. 12/12/23  Yes Patel, Sona, MD  polyethylene glycol (MIRALAX  / GLYCOLAX ) 17 g packet Take 17 g by mouth daily.   Yes [provider]  feeding supplement, GLUCERNA SHAKE, (GLUCERNA SHAKE) LIQD Take 237 mLs by mouth 3 (three) times daily between meals. 05/04/23   Alexander, Natalie, DO  SANTYL  250 UNIT/GM ointment Apply 1 Application topically  daily. Patient not taking: Reported on 03/01/2024 11/11/23   [provider]    Social History:  reports that she has never smoked. She has never used smokeless tobacco. She reports that she does not drink alcohol  and does not use drugs.    Physical Exam: Vitals:   03/01/24 1830 03/01/24 1900 03/01/24 2041 03/01/24 2221  BP: (!) 122/52 (!) 103/48  131/61  Pulse: 91 86  83  Resp: (!) 22 17  14   Temp:   98.3 F (36.8 C)   TempSrc:   Oral   SpO2: 100% 99%  100%  Weight:      Height:        Gen: NAD HENT: NCAT CV: normal heart sounds Lung: CTAB Abd: No TTP, normal bowel sounds MSK: No asymmetry, good bulk and tone Neuro: alert and oriented   Labs on Admission: I have personally reviewed following labs and imaging studies  CBC: Recent Labs  Lab 03/01/24 1654  WBC 16.5*  HGB 8.2*  HCT 28.3*  MCV 101.4*  PLT 413*   Basic Metabolic Panel: Recent Labs  Lab 03/01/24 1654  NA 161*  K 4.2  CL 119*  CO2 25  GLUCOSE 175*  BUN 65*  CREATININE 1.36*   CALCIUM  9.1  MG 3.5*   GFR: Estimated Creatinine Clearance: 24.6 mL/min (A) (by C-G formula based on SCr of 1.36 mg/dL (H)). Liver Function Tests: No results for input(s): AST, ALT, ALKPHOS, BILITOT, PROT, ALBUMIN  in the last 168 hours. No results for input(s): LIPASE, AMYLASE in the last 168 hours. No results for input(s): AMMONIA in the last 168 hours. Coagulation Profile: No results for input(s): INR, PROTIME in the last 168 hours. Cardiac Enzymes: No results for input(s): CKTOTAL, CKMB, CKMBINDEX, TROPONINI, TROPONINIHS in the last 168 hours. BNP (last 3 results) No results for input(s): BNP in the last 8760 hours. HbA1C: No results for input(s): HGBA1C in the last 72 hours. CBG: Recent Labs  Lab 03/01/24 2110  GLUCAP 131*   Lipid Profile: No results for input(s): CHOL, HDL, LDLCALC, TRIG, CHOLHDL, LDLDIRECT in the last 72 hours. Thyroid  Function Tests: No results for input(s): TSH, T4TOTAL, FREET4, T3FREE, THYROIDAB in the last 72 hours. Anemia Panel: No results for input(s): VITAMINB12, FOLATE, FERRITIN, TIBC, IRON, RETICCTPCT in the last 72 hours. Urine analysis:    Component Value Date/Time   COLORURINE AMBER (A) 03/01/2024 1604   APPEARANCEUR CLOUDY (A) 03/01/2024 1604   LABSPEC 1.023 03/01/2024 1604   PHURINE 5.0 03/01/2024 1604   GLUCOSEU NEGATIVE 03/01/2024 1604   HGBUR NEGATIVE 03/01/2024 1604   BILIRUBINUR NEGATIVE 03/01/2024 1604   BILIRUBINUR neg 08/08/2019 0922   KETONESUR NEGATIVE 03/01/2024 1604   PROTEINUR 100 (A) 03/01/2024 1604   UROBILINOGEN 0.2 08/08/2019 0922   NITRITE NEGATIVE 03/01/2024 1604   LEUKOCYTESUR TRACE (A) 03/01/2024 1604    Radiological Exams on Admission: I have personally reviewed images DG Chest Portable 1 View Result Date: 03/01/2024 CLINICAL DATA:  Weakness EXAM: PORTABLE CHEST 1 VIEW COMPARISON:  11/09/2023 FINDINGS: Aortic atherosclerosis. No focal  opacity, pleural effusion or pneumothorax. Normal cardiac size IMPRESSION: No active disease. Electronically Signed   By: Luke Bun M.D.   On: 03/01/2024 17:44    EKG: My personal interpretation of EKG shows: Pending  Assessment/Plan Principal Problem:   Failure to thrive in adult Active Problems:   Type 2 diabetes mellitus with polyneuropathy (HCC)   Mixed hyperlipidemia   Essential hypertension   PAD (peripheral artery disease)   Dementia (HCC)  Protein-calorie malnutrition, severe   S/P AKA (above knee amputation) unilateral, left (HCC)   Patient with dementia and worsening mental status found to have moderate hypernatremia concerning for dehydration and decreased oral intake.  She had recent admission for acute osteomyelitis and underwent amputation.  Her multiple admission along with significant hypernatremia and dementia suggest failure to thrive.  Patient is status post normal saline by EDP and further lactated Ringer 's.  Will continue with LR as patient is dehydrated and needs well-balanced fluids.  Once patient is hydrated then we can correct her free water deficit.  Current free water deficit is 1.8 L.  Will order Q8 sodium checks.  AKI: Pt with AKI. Baseline creatinine is 0.5-0.6. Suspect pre-renal etiology.  Likely in setting of dehydration given hypernatremia.  Will give IVF and trend creatinine.   Acute encephalopathy: Likely multifactorial in setting of UTI and hypernatremia.  Will need to reach out to [facility] to obtain collateral.  Will treat with ceftriaxone .  Urine cultures ordered.  LFTs and ammonia level ordered.  Less concern for stroke as no focal deficits appreciated on exam.  If not improving, can order MRI.  Will need PT OT evaluation once mental status improves.  Chronic Problems: Restart home meds once med reconciliation is completed and patient is able to tolerate p.o.  HTN: Holding home meds currently and can use IV labetalol  as needed.  Home regimen  per chart review appears to be lisinopril , hydralazine . HLD: Holding home regimen, she is on Lipitor  80 mg  T2DM: hold home meds, start on SSI and titrate Dementia: Does not appear to be on any pharmacotherapy for this.  Exact baseline unknown.  Will continue to call [facility] to obtain collateral.   VTE prophylaxis:  SQ Heparin   Diet: N.p.o. Code Status:  DNR/DNI(Do NOT Intubate) Telemetry:  Admission status: Inpatient, Progressive Patient is from: Home Anticipated d/c is to: Home Anticipated d/c is in: 2-3 days   Consults called: None   Severity of Illness: The appropriate patient status for this patient is INPATIENT. Inpatient status is judged to be reasonable and necessary in order to provide the required intensity of service to ensure the patient's safety. The patient's presenting symptoms, physical exam findings, and initial radiographic and laboratory data in the context of their chronic comorbidities is felt to place them at high risk for further clinical deterioration. Furthermore, it is not anticipated that the patient will be medically stable for discharge from the hospital within 2 midnights of admission.   * I certify that at the point of admission it is my clinical judgment that the patient will require inpatient hospital care spanning beyond 2 midnights from the point of admission due to high intensity of service, high risk for further deterioration and high frequency of surveillance required.DEWAINE Morene Bathe, MD Jolynn DEL. [facility]     [1]  Allergies Allergen Reactions   Metformin  And Related Nausea And Vomiting   "

## 2024-03-01 NOTE — ED Notes (Signed)
 CCMD called to admit patient to monitor

## 2024-03-01 NOTE — ED Triage Notes (Signed)
 See first nurse note. Pt alert on assessment. Denies complaints. Oriented to person only with hx dementia. NAD noted. Attempted to contact legal guardian [employer] Candace Gobble with no answer.

## 2024-03-01 NOTE — ED Notes (Signed)
 Called CCMD to initiate cardiac monitoring.

## 2024-03-02 ENCOUNTER — Inpatient Hospital Stay

## 2024-03-02 DIAGNOSIS — R627 Adult failure to thrive: Secondary | ICD-10-CM | POA: Diagnosis not present

## 2024-03-02 LAB — CBG MONITORING, ED
Glucose-Capillary: 147 mg/dL — ABNORMAL HIGH (ref 70–99)
Glucose-Capillary: 134 mg/dL — ABNORMAL HIGH (ref 70–99)
Glucose-Capillary: 130 mg/dL — ABNORMAL HIGH (ref 70–99)

## 2024-03-02 LAB — COMPREHENSIVE METABOLIC PANEL WITH GFR
ALT: 19 U/L (ref 0–44)
AST: 33 U/L (ref 15–41)
Albumin: 2.7 g/dL — ABNORMAL LOW (ref 3.5–5.0)
Alkaline Phosphatase: 132 U/L — ABNORMAL HIGH (ref 38–126)
Anion gap: 11 (ref 5–15)
BUN: 60 mg/dL — ABNORMAL HIGH (ref 8–23)
CO2: 25 mmol/L (ref 22–32)
Calcium: 8.8 mg/dL — ABNORMAL LOW (ref 8.9–10.3)
Chloride: 122 mmol/L — ABNORMAL HIGH (ref 98–111)
Creatinine, Ser: 1.19 mg/dL — ABNORMAL HIGH (ref 0.44–1.00)
GFR, Estimated: 44 mL/min — ABNORMAL LOW
Glucose, Bld: 154 mg/dL — ABNORMAL HIGH (ref 70–99)
Potassium: 4 mmol/L (ref 3.5–5.1)
Sodium: 158 mmol/L — ABNORMAL HIGH (ref 135–145)
Total Bilirubin: 0.5 mg/dL (ref 0.0–1.2)
Total Protein: 6.7 g/dL (ref 6.5–8.1)

## 2024-03-02 LAB — CBC
HCT: 22.6 % — ABNORMAL LOW (ref 36.0–46.0)
Hemoglobin: 6.6 g/dL — ABNORMAL LOW (ref 12.0–15.0)
MCH: 29.5 pg (ref 26.0–34.0)
MCHC: 29.2 g/dL — ABNORMAL LOW (ref 30.0–36.0)
MCV: 100.9 fL — ABNORMAL HIGH (ref 80.0–100.0)
Platelets: 313 K/uL (ref 150–400)
RBC: 2.24 MIL/uL — ABNORMAL LOW (ref 3.87–5.11)
RDW: 17.2 % — ABNORMAL HIGH (ref 11.5–15.5)
WBC: 14.3 K/uL — ABNORMAL HIGH (ref 4.0–10.5)
nRBC: 0 % (ref 0.0–0.2)

## 2024-03-02 LAB — HEMOGLOBIN
Hemoglobin: 7 g/dL — ABNORMAL LOW (ref 12.0–15.0)
Hemoglobin: 7.5 g/dL — ABNORMAL LOW (ref 12.0–15.0)

## 2024-03-02 LAB — BASIC METABOLIC PANEL WITH GFR
Anion gap: 12 (ref 5–15)
Anion gap: 13 (ref 5–15)
BUN: 59 mg/dL — ABNORMAL HIGH (ref 8–23)
BUN: 53 mg/dL — ABNORMAL HIGH (ref 8–23)
CO2: 25 mmol/L (ref 22–32)
CO2: 24 mmol/L (ref 22–32)
Calcium: 8.7 mg/dL — ABNORMAL LOW (ref 8.9–10.3)
Calcium: 8.6 mg/dL — ABNORMAL LOW (ref 8.9–10.3)
Chloride: 122 mmol/L — ABNORMAL HIGH (ref 98–111)
Chloride: 123 mmol/L — ABNORMAL HIGH (ref 98–111)
Creatinine, Ser: 1.06 mg/dL — ABNORMAL HIGH (ref 0.44–1.00)
Creatinine, Ser: 0.94 mg/dL (ref 0.44–1.00)
GFR, Estimated: 50 mL/min — ABNORMAL LOW
GFR, Estimated: 58 mL/min — ABNORMAL LOW
Glucose, Bld: 157 mg/dL — ABNORMAL HIGH (ref 70–99)
Glucose, Bld: 137 mg/dL — ABNORMAL HIGH (ref 70–99)
Potassium: 4 mmol/L (ref 3.5–5.1)
Potassium: 4 mmol/L (ref 3.5–5.1)
Sodium: 159 mmol/L — ABNORMAL HIGH (ref 135–145)
Sodium: 161 mmol/L (ref 135–145)

## 2024-03-02 LAB — AMMONIA: Ammonia: <13 umol/L (ref 9–35)

## 2024-03-02 LAB — GLUCOSE, CAPILLARY: Glucose-Capillary: 126 mg/dL — ABNORMAL HIGH (ref 70–99)

## 2024-03-02 MED ORDER — DEXTROSE 5 % IV SOLN: INTRAVENOUS | Status: DC

## 2024-03-02 MED ORDER — SODIUM CHLORIDE 0.45 % IV SOLN: INTRAVENOUS | Status: DC

## 2024-03-02 NOTE — ED Notes (Signed)
 Attempted to call [name], pt's legal guardian with [employer], and had no answer. Pt needing blood transfusion consent.

## 2024-03-02 NOTE — ED Notes (Signed)
 Dr. Jerelene notified of pt's abnormal HBG

## 2024-03-02 NOTE — ED Notes (Signed)
 Called lab to check status of pt's labs that was sent down at 1720. They advised they would process now.

## 2024-03-02 NOTE — ED Notes (Signed)
 MD Tan asked this RN to repeat EKG in about 30 minutes.

## 2024-03-02 NOTE — ED Notes (Signed)
 Pink, purple, and lt green top sent to lab

## 2024-03-02 NOTE — Progress Notes (Addendum)
 " PROGRESS NOTE    [name]  FMW:[id number] DOB: [birth year] DOA: 03/01/2024 PCP: Rudolpho Norleen BIRCH, MD  Chief Complaint  Patient presents with   evaluation    Hospital Course:  [name] is a 89 y.o. year old female with medical history of hypertension, hyperlipidemia, type 2 diabetes, history of osteomyelitis status post left AKA, dementia presenting to the ED after having worsening confusion at the facility. Per EDP, patient has legal guardian at DSS and per report her family should not be contacted.  Patient was admitted with altered mental status, suspected due to UTI and hypernatremia, AKI. Hospital course as below  Subjective: Patient was examined at the bedside, new to me today Patient is able to open her eyes, is able to tell me her name, upon questioning she says she is not in pain Not able to answer any further questions Tried calling legal guardian [place], went to voicemail.  Reached out to [name] and also talked with her supervisor Patton Avers, who gave verbal consent for blood transfusion   Objective: Vitals:   03/02/24 0619 03/02/24 0710 03/02/24 0900 03/02/24 1024  BP: (!) 116/50  (!) 164/80   Pulse: 84  (!) 103   Resp: 18  (!) 22   Temp: 98.5 F (36.9 C)   (!) 97.2 F (36.2 C)  TempSrc: Axillary   Oral  SpO2: 98% 100% 100%   Weight:      Height:        Intake/Output Summary (Last 24 hours) at 03/02/2024 1037 Last data filed at 03/01/2024 2102 Gross per 24 hour  Intake 3 ml  Output --  Net 3 ml   Filed Weights   03/01/24 1230  Weight: 54.4 kg    Examination: Gen: NAD CV: normal heart sounds Lung: CTAB Abd: No TTP, normal bowel sounds MSK: No asymmetry, good bulk and tone Neuro: alert and oriented  Assessment & Plan:  Principal Problem:   Failure to thrive in adult Active Problems:   Type 2 diabetes mellitus with polyneuropathy (HCC)   Mixed hyperlipidemia   Essential hypertension   PAD (peripheral artery disease)    Dementia (HCC)   Protein-calorie malnutrition, severe   S/P AKA (above knee amputation) unilateral, left (HCC)  Acute toxic/metabolic encephalopathy Dementia Failure to thrive Suspected multifactorial, likely due to dementia, hypernatremia, UTI Tried calling [facility] to obtain collateral information, unknown baseline CT head no acute abnormality.  Asymmetric left mesial temporal lobe atrophy, appears progressed since 2023 Ammonia normal, no focal deficits recent admission for acute osteomyelitis and underwent amputation Palliative care evaluation for GOC if no improvement NPO due to AMS, IV fluids  Hypernatremia On presentation Na 161 -> 159 Change LR to 0.45% NS at 75cc/hr Monitor sodium q6, goal correction 6-8 in 24 hrs  Addendum: Na inc to 161, change to D5W and repeat BMP in 4 hrs  UTI Mild leukocytosis, chronically elevated Continue IV ceftriaxone  Follow-up blood cultures, urine culture  AKI Cr improving, baseline ~0.5-0.6 Likely prerenal etiology, continue IV fluids Monitor creatinine  Chronic anemia Hb dropped 8.2 to 6.6, s/p IV fluids, repeat Hb 7.5 Monitor Hb and transfuse if < 7  HTN Hold lisinopril , p.o. hydralazine  Monitor BP  Type II DM SSI  HLD Hold statin   CAD, PVD s/p left lower extremity angioplasty on 11/06/2023, history of stroke: Continue aspirin , plavix  and Lipitor  - once able to tolerate po intake   --PT/OT eval when mental status improves  DVT prophylaxis: SCD  Code Status: Limited: Do not attempt resuscitation (DNR) -DNR-LIMITED -Do Not Intubate/DNI  Disposition:  TBD  Consultants:  None  Procedures:  None  Antimicrobials:  Anti-infectives (From admission, onward)    Start     Dose/Rate Route Frequency Ordered Stop   03/02/24 1700  cefTRIAXone  (ROCEPHIN ) 1 g in sodium chloride  0.9 % 100 mL IVPB        1 g 200 mL/hr over 30 Minutes Intravenous Every 24 hours 03/01/24 2337     03/01/24 1730  cefTRIAXone  (ROCEPHIN ) 1 g  in sodium chloride  0.9 % 100 mL IVPB        1 g 200 mL/hr over 30 Minutes Intravenous  Once 03/01/24 1728 03/01/24 1845       Data Reviewed: I have personally reviewed following labs and imaging studies CBC: Recent Labs  Lab 03/01/24 1654 03/02/24 0348  WBC 16.5* 14.3*  HGB 8.2* 6.6*  HCT 28.3* 22.6*  MCV 101.4* 100.9*  PLT 413* 313   Basic Metabolic Panel: Recent Labs  Lab 03/01/24 1654 03/01/24 2330 03/02/24 0348  NA 161* 158* 159*  K 4.2 4.0 4.0  CL 119* 122* 122*  CO2 25 25 25   GLUCOSE 175* 154* 157*  BUN 65* 60* 59*  CREATININE 1.36* 1.19* 1.06*  CALCIUM  9.1 8.8* 8.7*  MG 3.5*  --   --    GFR: Estimated Creatinine Clearance: 31.5 mL/min (A) (by C-G formula based on SCr of 1.06 mg/dL (H)). Liver Function Tests: Recent Labs  Lab 03/01/24 2330  AST 33  ALT 19  ALKPHOS 132*  BILITOT 0.5  PROT 6.7  ALBUMIN  2.7*   CBG: Recent Labs  Lab 03/01/24 2110 03/02/24 0738  GLUCAP 131* 147*    No results found for this or any previous visit (from the past 240 hours).   Radiology Studies: DG Chest Portable 1 View Result Date: 03/01/2024 CLINICAL DATA:  Weakness EXAM: PORTABLE CHEST 1 VIEW COMPARISON:  11/09/2023 FINDINGS: Aortic atherosclerosis. No focal opacity, pleural effusion or pneumothorax. Normal cardiac size IMPRESSION: No active disease. Electronically Signed   By: Luke Bun M.D.   On: 03/01/2024 17:44    Scheduled Meds:  heparin   5,000 Units Subcutaneous Q8H   insulin  aspart  0-9 Units Subcutaneous TID WC   sodium chloride  flush  3 mL Intravenous Q12H   Continuous Infusions:  cefTRIAXone  (ROCEPHIN )  IV     lactated ringers  125 mL/hr at 03/02/24 0037     LOS: 1 day  MDM: Patient is high risk for one or more organ failure.  They necessitate ongoing hospitalization for continued IV therapies and subsequent lab monitoring. Total time spent interpreting labs and vitals, reviewing the medical record, coordinating care amongst consultants and care  team members, directly assessing and discussing care with the patient and/or family: 55 min Laree Lock, MD [facility]  To contact the attending physician between 7A-7P please use Epic Chat. To contact the covering physician during after hours 7P-7A, please review Amion.  03/02/2024, 10:37 AM   *This document has been created with the assistance of dictation software. Please excuse typographical errors. *   "

## 2024-03-02 NOTE — ED Notes (Signed)
 Messaged provider about pt's due SQ heparin  due to concern for pt's HBG level

## 2024-03-02 NOTE — Progress Notes (Signed)
 OT Cancellation Note  Patient Details Name: [name] MRN: [id number] DOB: Jun 16, 1933   Cancelled Treatment:    Reason Eval/Treat Not Completed: Patient not medically ready per RN, hold therapy. Patients HGB has dropped 2 points in <24 hours. OT to follow up when patietn medically ready.  [name] Clause 03/02/2024, 9:47 AM

## 2024-03-02 NOTE — Progress Notes (Signed)
 PT Cancellation Note  Patient Details Name: [name] MRN: [id number] DOB: 17-Aug-1933   Cancelled Treatment:    Reason Eval/Treat Not Completed: Patient not medically ready Chart reviewed, spoke with nurse. Pt's Hgb dropped to 6.6 overnight - suggested we hold PT this date.     Carmin JONELLE Deed, DPT 03/02/2024, 9:56 AM

## 2024-03-03 DIAGNOSIS — R627 Adult failure to thrive: Secondary | ICD-10-CM | POA: Diagnosis not present

## 2024-03-03 LAB — RETICULOCYTES
Immature Retic Fract: 12.8 % (ref 2.3–15.9)
RBC.: 2.62 MIL/uL — ABNORMAL LOW (ref 3.87–5.11)
Retic Count, Absolute: 22.8 K/uL (ref 19.0–186.0)
Retic Ct Pct: 0.9 % (ref 0.4–3.1)

## 2024-03-03 LAB — BASIC METABOLIC PANEL WITH GFR
Anion gap: 10 (ref 5–15)
Anion gap: 9 (ref 5–15)
Anion gap: 9 (ref 5–15)
Anion gap: 11 (ref 5–15)
BUN: 51 mg/dL — ABNORMAL HIGH (ref 8–23)
BUN: 50 mg/dL — ABNORMAL HIGH (ref 8–23)
BUN: 45 mg/dL — ABNORMAL HIGH (ref 8–23)
BUN: 42 mg/dL — ABNORMAL HIGH (ref 8–23)
CO2: 25 mmol/L (ref 22–32)
CO2: 25 mmol/L (ref 22–32)
CO2: 26 mmol/L (ref 22–32)
CO2: 23 mmol/L (ref 22–32)
Calcium: 8.2 mg/dL — ABNORMAL LOW (ref 8.9–10.3)
Calcium: 8.3 mg/dL — ABNORMAL LOW (ref 8.9–10.3)
Calcium: 8.5 mg/dL — ABNORMAL LOW (ref 8.9–10.3)
Calcium: 8.3 mg/dL — ABNORMAL LOW (ref 8.9–10.3)
Chloride: 124 mmol/L — ABNORMAL HIGH (ref 98–111)
Chloride: 124 mmol/L — ABNORMAL HIGH (ref 98–111)
Chloride: 121 mmol/L — ABNORMAL HIGH (ref 98–111)
Chloride: 120 mmol/L — ABNORMAL HIGH (ref 98–111)
Creatinine, Ser: 0.85 mg/dL (ref 0.44–1.00)
Creatinine, Ser: 0.85 mg/dL (ref 0.44–1.00)
Creatinine, Ser: 0.81 mg/dL (ref 0.44–1.00)
Creatinine, Ser: 0.78 mg/dL (ref 0.44–1.00)
GFR, Estimated: >60 mL/min
GFR, Estimated: >60 mL/min
GFR, Estimated: >60 mL/min
GFR, Estimated: >60 mL/min
Glucose, Bld: 171 mg/dL — ABNORMAL HIGH (ref 70–99)
Glucose, Bld: 188 mg/dL — ABNORMAL HIGH (ref 70–99)
Glucose, Bld: 253 mg/dL — ABNORMAL HIGH (ref 70–99)
Glucose, Bld: 223 mg/dL — ABNORMAL HIGH (ref 70–99)
Potassium: 3.7 mmol/L (ref 3.5–5.1)
Potassium: 3.7 mmol/L (ref 3.5–5.1)
Potassium: 3.5 mmol/L (ref 3.5–5.1)
Potassium: 3.2 mmol/L — ABNORMAL LOW (ref 3.5–5.1)
Sodium: 160 mmol/L — ABNORMAL HIGH (ref 135–145)
Sodium: 158 mmol/L — ABNORMAL HIGH (ref 135–145)
Sodium: 156 mmol/L — ABNORMAL HIGH (ref 135–145)
Sodium: 154 mmol/L — ABNORMAL HIGH (ref 135–145)

## 2024-03-03 LAB — FOLATE: Folate: 10.7 ng/mL

## 2024-03-03 LAB — BLOOD CULTURE ID PANEL (REFLEXED) - BCID2

## 2024-03-03 LAB — CBC
HCT: 21.3 % — ABNORMAL LOW (ref 36.0–46.0)
Hemoglobin: 6.1 g/dL — ABNORMAL LOW (ref 12.0–15.0)
MCH: 29 pg (ref 26.0–34.0)
MCHC: 28.6 g/dL — ABNORMAL LOW (ref 30.0–36.0)
MCV: 101.4 fL — ABNORMAL HIGH (ref 80.0–100.0)
Platelets: 267 K/uL (ref 150–400)
RBC: 2.1 MIL/uL — ABNORMAL LOW (ref 3.87–5.11)
RDW: 17.2 % — ABNORMAL HIGH (ref 11.5–15.5)
WBC: 10.2 K/uL (ref 4.0–10.5)
nRBC: 0 % (ref 0.0–0.2)

## 2024-03-03 LAB — HEMOGLOBIN: Hemoglobin: 8.1 g/dL — ABNORMAL LOW (ref 12.0–15.0)

## 2024-03-03 LAB — GLUCOSE, CAPILLARY
Glucose-Capillary: 210 mg/dL — ABNORMAL HIGH (ref 70–99)
Glucose-Capillary: 214 mg/dL — ABNORMAL HIGH (ref 70–99)
Glucose-Capillary: 206 mg/dL — ABNORMAL HIGH (ref 70–99)
Glucose-Capillary: 179 mg/dL — ABNORMAL HIGH (ref 70–99)

## 2024-03-03 LAB — IRON AND TIBC
Iron: 64 ug/dL (ref 28–170)
Saturation Ratios: 42 % — ABNORMAL HIGH (ref 10.4–31.8)
TIBC: 153 ug/dL — ABNORMAL LOW (ref 250–450)
UIBC: 88 ug/dL

## 2024-03-03 LAB — MRSA NEXT GEN BY PCR, NASAL: MRSA by PCR Next Gen: NOT DETECTED

## 2024-03-03 LAB — FERRITIN: Ferritin: 1017 ng/mL — ABNORMAL HIGH (ref 11–307)

## 2024-03-03 LAB — TSH: TSH: 1.08 u[IU]/mL (ref 0.350–4.500)

## 2024-03-03 LAB — VITAMIN B12: Vitamin B-12: >4000 pg/mL — ABNORMAL HIGH (ref 180–914)

## 2024-03-03 LAB — URINE CULTURE: Culture: NO GROWTH

## 2024-03-03 LAB — HEMOGLOBIN AND HEMATOCRIT, BLOOD
HCT: 20.9 % — ABNORMAL LOW (ref 36.0–46.0)
Hemoglobin: 6 g/dL — ABNORMAL LOW (ref 12.0–15.0)

## 2024-03-03 LAB — PREPARE RBC (CROSSMATCH)

## 2024-03-03 LAB — MAGNESIUM: Magnesium: 2.8 mg/dL — ABNORMAL HIGH (ref 1.7–2.4)

## 2024-03-03 MED ORDER — ORAL CARE MOUTH RINSE: 15.0000 mL | OROMUCOSAL | Status: DC | PRN

## 2024-03-03 MED ORDER — ORAL CARE MOUTH RINSE
15.0000 mL | OROMUCOSAL | Status: DC
  Administered 2024-03-03 – 2024-03-05 (×6): 15 mL via OROMUCOSAL

## 2024-03-03 MED ORDER — SODIUM CHLORIDE 0.9% IV SOLUTION: Freq: Once | INTRAVENOUS | Status: AC

## 2024-03-03 MED ORDER — POTASSIUM CHLORIDE 10 MEQ/100ML IV SOLN
10.0000 meq | INTRAVENOUS | Status: AC
  Administered 2024-03-03 (×5): 10 meq via INTRAVENOUS
  Filled 2024-03-03 (×5): qty 100

## 2024-03-03 MED ORDER — PIPERACILLIN-TAZOBACTAM 3.375 G IVPB
3.3750 g | Freq: Three times a day (TID) | INTRAVENOUS | Status: DC
  Administered 2024-03-03 – 2024-03-04 (×3): 3.375 g via INTRAVENOUS
  Filled 2024-03-03 (×3): qty 50

## 2024-03-03 NOTE — Progress Notes (Signed)
 " PROGRESS NOTE    [name]  FMW:[id number] DOB: 10-05-1933 DOA: 03/01/2024 PCP: Rudolpho Norleen BIRCH, MD  Chief Complaint  Patient presents with   evaluation    Hospital Course:  [name] is a 89 y.o. year old female with medical history of hypertension, hyperlipidemia, type 2 diabetes, history of osteomyelitis status post left AKA, dementia presenting to the ED after having worsening confusion at the facility. Per EDP, patient has legal guardian at [employer] and per report her family should not be contacted.  Patient was admitted with altered mental status, suspected due to UTI and hypernatremia, AKI. Hospital course as below  Subjective: Patient was examined at the bedside, opening eyes, unable to answer any questions [facility] for collateral information Reports patient is AO x 1 at baseline, no known history of bleeding prior to presentation Spoke with [facility] at [facility]   Objective: Vitals:   03/03/24 0727 03/03/24 1112 03/03/24 1541 03/03/24 1544  BP: (!) 147/68 (!) 151/62 (!) 111/58 (!) 104/48  Pulse: 80 83 88 86  Resp: 17 15 18    Temp: 97.6 F (36.4 C) 98.2 F (36.8 C)  99.1 F (37.3 C)  TempSrc:      SpO2: 100% 100% 98% 98%  Weight:      Height:        Intake/Output Summary (Last 24 hours) at 03/03/2024 1736 Last data filed at 03/03/2024 9373 Gross per 24 hour  Intake 1249.08 ml  Output 150 ml  Net 1099.08 ml   Filed Weights   03/01/24 1230 03/02/24 2014 03/03/24 0500  Weight: 54.4 kg 45.9 kg 50.3 kg    Examination: Gen: NAD CV: normal heart sounds Lung: CTAB Abd: No TTP, normal bowel sounds MSK: No asymmetry, good bulk and tone Neuro: alert and oriented  Assessment & Plan:  Principal Problem:   Failure to thrive in adult Active Problems:   Type 2 diabetes mellitus with polyneuropathy (HCC)   Mixed hyperlipidemia   Essential hypertension   PAD (peripheral artery disease)   Dementia (HCC)   Protein-calorie malnutrition, severe   S/P  AKA (above knee amputation) unilateral, left (HCC)  Acute toxic/metabolic encephalopathy Dementia Failure to thrive Suspected multifactorial, likely due to dementia, hypernatremia, UTI AO x 1 at baseline, bed bound per [facility] head no acute abnormality.  Asymmetric left mesial temporal lobe atrophy, appears progressed since 2023 Ammonia normal, no focal deficits TSH, B12, VBG pending Will initiate tube feeds tomorrow if no improvement Palliative care evaluation for GOC if no improvement NPO due to AMS, IV fluids  Hypernatremia Sodium improved from 161 overnight to 156 (corrected sodium) On D5W 100 cc/h BMP every 6 hr, goal correction 6-8 in 24 hr  UTI Mild leukocytosis, chronically elevated Continue IV ceftriaxone  Follow-up blood cultures, urine culture  AKI - resolved Likely prerenal etiology, continue IV fluids Monitor creatinine  Hypokalemia Check magnesium  Monitor and replete as needed  Acute on chronic anemia Hb dropped to 6.0, s/p 1 unit PRBC improved to 8.1 No evidence of bleeding/melena. Anemia panel sent, Haptoglobin, FOBT. UA with no hematuria Monitor Hb and transfuse if < 7  HTN Hold lisinopril , p.o. hydralazine  Monitor BP  Type II DM SSI  HLD Hold statin   CAD, PVD s/p left lower extremity angioplasty on 11/06/2023, history of stroke: Continue aspirin , plavix  and Lipitor  - once able to tolerate po intake   --PT/OT eval - no needs, bedbound at baseline  DVT prophylaxis: SCD   Code Status: Limited: Do not  attempt resuscitation (DNR) -DNR-LIMITED -Do Not Intubate/DNI  Disposition:  TBD  Consultants:  None  Procedures:  None  Antimicrobials:  Anti-infectives (From admission, onward)    Start     Dose/Rate Route Frequency Ordered Stop   03/02/24 1700  cefTRIAXone  (ROCEPHIN ) 1 g in sodium chloride  0.9 % 100 mL IVPB        1 g 200 mL/hr over 30 Minutes Intravenous Every 24 hours 03/01/24 2337     03/01/24 1730  cefTRIAXone  (ROCEPHIN ) 1 g in  sodium chloride  0.9 % 100 mL IVPB        1 g 200 mL/hr over 30 Minutes Intravenous  Once 03/01/24 1728 03/01/24 1845       Data Reviewed: I have personally reviewed following labs and imaging studies CBC: Recent Labs  Lab 03/01/24 1654 03/02/24 0348 03/02/24 1109 03/02/24 1708 03/03/24 0151 03/03/24 0313 03/03/24 1459  WBC 16.5* 14.3*  --   --  10.2  --   --   HGB 8.2* 6.6* 7.0* 7.5* 6.1* 6.0* 8.1*  HCT 28.3* 22.6*  --   --  21.3* 20.9*  --   MCV 101.4* 100.9*  --   --  101.4*  --   --   PLT 413* 313  --   --  267  --   --    Basic Metabolic Panel: Recent Labs  Lab 03/01/24 1654 03/01/24 2330 03/02/24 2058 03/03/24 0151 03/03/24 0313 03/03/24 0911 03/03/24 1459  NA 161*   < > 161* 160* 158* 156* 154*  K 4.2   < > 4.0 3.7 3.7 3.5 3.2*  CL 119*   < > 123* 124* 124* 121* 120*  CO2 25   < > 24 25 25 26 23   GLUCOSE 175*   < > 137* 171* 188* 253* 223*  BUN 65*   < > 53* 51* 50* 45* 42*  CREATININE 1.36*   < > 0.94 0.85 0.85 0.81 0.78  CALCIUM  9.1   < > 8.6* 8.2* 8.3* 8.5* 8.3*  MG 3.5*  --   --   --   --   --   --    < > = values in this interval not displayed.   GFR: Estimated Creatinine Clearance: 38.6 mL/min (by C-G formula based on SCr of 0.78 mg/dL). Liver Function Tests: Recent Labs  Lab 03/01/24 2330  AST 33  ALT 19  ALKPHOS 132*  BILITOT 0.5  PROT 6.7  ALBUMIN  2.7*   CBG: Recent Labs  Lab 03/02/24 1654 03/02/24 2015 03/03/24 0728 03/03/24 1113 03/03/24 1635  GLUCAP 130* 126* 210* 214* 206*    Recent Results (from the past 240 hours)  Urine Culture (for pregnant, neutropenic or urologic patients or patients with an indwelling urinary catheter)     Status: None   Collection Time: 03/01/24  4:04 PM   Specimen: Urine, Clean Catch  Result Value Ref Range Status   Specimen Description   Final    URINE, CLEAN CATCH Performed at [facility], [address] [place], [place] [address]    Special Requests   Final    NONE Performed at  [facility], [address] [place], [place] [address]    Culture   Final    NO GROWTH Performed at [facility], [address] [place], [place] [address]    Report Status 03/03/2024 FINAL  Final  CULTURE, BLOOD (ROUTINE X 2) w Reflex to ID Panel     Status: None (  Preliminary result)   Collection Time: 03/02/24 11:09 AM   Specimen: BLOOD  Result Value Ref Range Status   Specimen Description BLOOD BLOOD LEFT ARM  Final   Special Requests   Final    BOTTLES DRAWN AEROBIC AND ANAEROBIC Blood Culture adequate volume   Culture   Final    NO GROWTH < 24 HOURS Performed at [facility], [address] [place], [place] [address]    Report Status PENDING  Incomplete  MRSA Next Gen by PCR, Nasal     Status: None   Collection Time: 03/03/24 12:40 PM   Specimen: Nasal Mucosa; Nasal Swab  Result Value Ref Range Status   MRSA by PCR Next Gen NOT DETECTED NOT DETECTED Final    Comment: (NOTE) The GeneXpert MRSA Assay (FDA approved for NASAL specimens only), is one component of a comprehensive MRSA colonization surveillance program. It is not intended to diagnose MRSA infection nor to guide or monitor treatment for MRSA infections. Test performance is not FDA approved in patients less than 77 years old. Performed at [facility], [address], [place], [place] [address]      Radiology Studies: CT HEAD WO CONTRAST ( ) Result Date: 03/02/2024 EXAM: CT HEAD WITHOUT CONTRAST 03/02/2024 12:35:14 PM TECHNIQUE: CT of the head was performed without the administration of intravenous contrast. Automated exposure control, iterative reconstruction, and/or weight based adjustment of the mA/kV was utilized to reduce the radiation dose to as low as reasonably achievable. COMPARISON: Head CT 01/07/2022. CLINICAL HISTORY: 89 year old female was unresponsive. FINDINGS: BRAIN AND VENTRICLES: No acute hemorrhage. No evidence of acute infarct. No hydrocephalus. No  extra-axial collection. No mass effect or midline shift. Brain volume not significantly changed since 2023. Asymmetric left mesial temporal lobe atrophy (coronal image 19) seems chronic but progressed. Chronic encephalomalacia in the left inferior frontal gyrus is stable and nonspecific (coronal image 15). Patchy and confluent cerebral white matter hypodensity is not significantly changed. Advanced calcified atherosclerosis at the skull base. No suspicious intracranial vascular hyperdensity. ORBITS: Chronic round metal retained foreign body along the superior right orbit preseptal space is unchanged. SINUSES: Paranasal sinuses remain well aerated. SOFT TISSUES AND SKULL: Partially visible bulky and severe chronic degeneration at the craniocervical junction, advanced chronic ligamentous hypertrophy at C1-C2 better seen previously. And chronic cervicomedullary junction spinal stenosis only partially visible today (series 5 image 17). No acute soft tissue abnormality. No skull fracture. Tympanic cavities and mastoids remain well aerated. IMPRESSION: 1. No acute or new intracranial abnormality. Cerebral white matter disease, left inferior frontal gyrus encephalomalacia. 2. Asymmetric left mesial temporal lobe atrophy appears progressed since 2023. 3. 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 Electronically signed by: Helayne Hurst MD 03/02/2024 12:43 PM EST RP Workstation: [id number]   DG Chest Portable 1 View Result Date: 03/01/2024 CLINICAL DATA:  Weakness EXAM: PORTABLE CHEST 1 VIEW COMPARISON:  11/09/2023 FINDINGS: Aortic atherosclerosis. No focal opacity, pleural effusion or pneumothorax. Normal cardiac size IMPRESSION: No active disease. Electronically Signed   By: Luke Bun M.D.   On: 03/01/2024 17:44    Scheduled Meds:  insulin  aspart  0-9 Units Subcutaneous TID WC   sodium chloride  flush  3 mL Intravenous Q12H   Continuous Infusions:  cefTRIAXone  (ROCEPHIN )  IV  Stopped (03/02/24 1654)   dextrose  100 mL/hr at 03/03/24 1001     LOS: 2 days  MDM: Patient is high risk for one or more organ failure.  They necessitate ongoing hospitalization for continued IV therapies and subsequent  lab monitoring. Total time spent interpreting labs and vitals, reviewing the medical record, coordinating care amongst consultants and care team members, directly assessing and discussing care with the patient and/or family: 55 min Laree Lock, MD [facility]  To contact the attending physician between 7A-7P please use Epic Chat. To contact the covering physician during after hours 7P-7A, please review Amion.  03/03/2024, 5:36 PM   *This document has been created with the assistance of dictation software. Please excuse typographical errors. *   "

## 2024-03-03 NOTE — Progress Notes (Signed)
 PT Cancellation Note  Patient Details Name: [name] MRN: [id number] DOB: 12-Jan-1934   Cancelled Treatment:    Reason Eval/Treat Not Completed: PT screened, no needs identified, will sign off Per chart review, Pt is LTC resident and is non ambulatory at baseline, fully bed level. Pt is hoyer lift transfer and staff assist with all ADLs.  Pt at/near her functional baseline and does not need skilled PT intervention at this time. Will complete PT orders.   Carmin JONELLE Deed, DPT 03/03/2024, 1:26 PM

## 2024-03-03 NOTE — Progress Notes (Signed)
 PHARMACY - PHYSICIAN COMMUNICATION CRITICAL VALUE ALERT - BLOOD CULTURE IDENTIFICATION (BCID)  [name] is an 89 y.o. female who presented to [facility] on 03/01/2024 with a chief complaint of AMS / suspected UTI  Assessment: 1/2 bottles (1-set collected) GNR. BCID detected Bacteroides fragilis. Suspected source would be GI.  Name of physician (or Provider) Contacted: Laneta Boston  Current antibiotics: Ceftriaxone   Changes to prescribed antibiotics recommended:  Switch to Zosyn   Results for orders placed or performed during the hospital encounter of 03/01/24  Blood Culture ID Panel (Reflexed) (Collected: 03/02/2024 11:09 AM)  Result Value Ref Range   Enterococcus faecalis NOT DETECTED NOT DETECTED   Enterococcus Faecium NOT DETECTED NOT DETECTED   Listeria monocytogenes NOT DETECTED NOT DETECTED   Staphylococcus species NOT DETECTED NOT DETECTED   Staphylococcus aureus (BCID) NOT DETECTED NOT DETECTED   Staphylococcus epidermidis NOT DETECTED NOT DETECTED   Staphylococcus lugdunensis NOT DETECTED NOT DETECTED   Streptococcus species NOT DETECTED NOT DETECTED   Streptococcus agalactiae NOT DETECTED NOT DETECTED   Streptococcus pneumoniae NOT DETECTED NOT DETECTED   Streptococcus pyogenes NOT DETECTED NOT DETECTED   A.calcoaceticus-baumannii NOT DETECTED NOT DETECTED   Bacteroides fragilis DETECTED (A) NOT DETECTED   Enterobacterales NOT DETECTED NOT DETECTED   Enterobacter cloacae complex NOT DETECTED NOT DETECTED   Escherichia coli NOT DETECTED NOT DETECTED   Klebsiella aerogenes NOT DETECTED NOT DETECTED   Klebsiella oxytoca NOT DETECTED NOT DETECTED   Klebsiella pneumoniae NOT DETECTED NOT DETECTED   Proteus species NOT DETECTED NOT DETECTED   Salmonella species NOT DETECTED NOT DETECTED   Serratia marcescens NOT DETECTED NOT DETECTED   Haemophilus influenzae NOT DETECTED NOT DETECTED   Neisseria meningitidis NOT DETECTED NOT DETECTED   Pseudomonas  aeruginosa NOT DETECTED NOT DETECTED   Stenotrophomonas maltophilia NOT DETECTED NOT DETECTED   Candida albicans NOT DETECTED NOT DETECTED   Candida auris NOT DETECTED NOT DETECTED   Candida glabrata NOT DETECTED NOT DETECTED   Candida krusei NOT DETECTED NOT DETECTED   Candida parapsilosis NOT DETECTED NOT DETECTED   Candida tropicalis NOT DETECTED NOT DETECTED   Cryptococcus neoformans/gattii NOT DETECTED NOT DETECTED    Marolyn KATHEE Mare 03/03/2024  9:23 PM

## 2024-03-03 NOTE — TOC Initial Note (Signed)
 Transition of Care [facility]) - Initial/Assessment Note    Patient Details  Name: [name] MRN: [id number] Date of Birth: 18-Apr-1933  Transition of Care [facility]) CM/SW Contact:    Shasta DELENA Daring, RN Phone Number: 03/03/2024, 7:13 PM  Clinical Narrative:                 Patient has DSS guardian and is a long term resident of [facility]. Chart reviewed. Notes indicate family is not to be contacted.  Left VM for DSS guardian Alberta Estrin, requesting return call.  Readmit risk assessment complete  Expected Discharge Plan: Skilled Nursing Facility Barriers to Discharge: Continued Medical Work up   Patient Goals and CMS Choice            Expected Discharge Plan and Services                                              Prior Living Arrangements/Services   Lives with:: Facility Resident Patient language and need for interpreter reviewed:: Yes Do you feel safe going back to the place where you live?: Yes      Need for Family Participation in Patient Care: Yes (Comment) Care giver support system in place?: Yes (comment)   Criminal Activity/Legal Involvement Pertinent to Current Situation/Hospitalization: No - Comment as needed  Activities of Daily Living   ADL Screening (condition at time of admission) Independently performs ADLs?: No Does the patient have a NEW difficulty with bathing/dressing/toileting/self-feeding that is expected to last >3 days?: No Does the patient have a NEW difficulty with getting in/out of bed, walking, or climbing stairs that is expected to last >3 days?: No Does the patient have a NEW difficulty with communication that is expected to last >3 days?: No Is the patient deaf or have difficulty hearing?: No Does the patient have difficulty seeing, even when wearing glasses/contacts?: No Does the patient have difficulty concentrating, remembering, or making decisions?: Yes  Permission Sought/Granted                  Emotional  Assessment       Orientation: : Oriented to Self Alcohol  / Substance Use: Not Applicable Psych Involvement: No (comment)  Admission diagnosis:  Acute hypernatremia [E87.0] Failure to thrive in adult [R62.7] Acute cystitis without hematuria [N30.00] Patient Active Problem List   Diagnosis Date Noted   Failure to thrive in adult 03/01/2024   S/P AKA (above knee amputation) unilateral, left (HCC) 12/10/2023   Acute osteomyelitis of toe, left (HCC) 12/04/2023   Critical limb ischemia of left lower extremity (HCC) 11/06/2023   Ischemic foot ulcer due to atherosclerosis of native artery of limb (HCC) 11/06/2023   Protein-calorie malnutrition, severe 04/30/2023   Gangrene of toe (HCC) 04/28/2023   Dementia (HCC) 04/09/2023   DJD (degenerative joint disease) 01/21/2023   Ischemia of left lower extremity 08/27/2022   Osteomyelitis of left foot (HCC) 08/27/2022   Gangrene of toe of left foot (HCC) 08/26/2022   Acute osteomyelitis of left foot (HCC) 08/08/2022   Diabetic foot infection (HCC) 08/07/2022   Hemiplegia affecting left side in left-dominant patient as late effect of cerebrovascular disease (HCC) 01/10/2022   Vaginal candidiasis 01/10/2022   Leg wound, left 01/08/2022   Hypoglycemia 01/07/2022   Leukocytosis 01/07/2022   MRI contraindicated due to metal implant 01/07/2022   Open wound of left foot 01/07/2022  Atherosclerosis of native arteries of the extremities with ulceration (HCC) 06/06/2021   Pressure injury of skin 06/01/2021   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   PAD (peripheral artery disease) 05/17/2021   Right hemiparesis (HCC) 05/16/2021   Cellulitis of left foot 05/15/2021   Hypokalemia 05/15/2021   History of stroke 10/07/2019   Altered mental status 10/06/2019   Carotid artery disease 09/28/2019   Left hemiparesis (HCC) 09/28/2019   Hemiplegia and hemiparesis following cerebral infarction affecting right dominant side (HCC) 09/27/2019   Primary osteoarthritis  of both hips 11/06/2017   Microalbuminuria 11/02/2017   Cystocele, midline 09/04/2017   Incomplete uterine prolapse 09/04/2017   Ankle edema 04/27/2017   Malnutrition of moderate degree 01/15/2016   Dehydration    Incarcerated ventral hernia    Diabetic ketoacidosis without coma associated with type 2 diabetes mellitus (HCC)    Type 2 diabetes mellitus with polyneuropathy (HCC) 08/06/2015   Carpal tunnel syndrome, bilateral 07/10/2015   Osteopenia of multiple sites 07/10/2015   Arthralgia of multiple sites 06/27/2015   Mixed hyperlipidemia 11/06/2014   Hypomagnesemia 11/02/2013   Essential hypertension 11/02/2013   PCP:  Rudolpho Norleen BIRCH, MD Pharmacy:   [facility]. - [place], [facility] - [address] [place] [address] Phone: [phone] Fax: [phone]     Social Drivers of Health (SDOH) Social History: SDOH Screenings   Food Insecurity: Patient Unable To Answer (12/05/2023)  Housing: Unknown (12/05/2023)  Transportation Needs: Patient Unable To Answer (12/05/2023)  Utilities: Patient Unable To Answer (12/05/2023)  Social Connections: Patient Unable To Answer (12/05/2023)  Tobacco Use: Low Risk (03/01/2024)   SDOH Interventions:     Readmission Risk Interventions    03/03/2024    7:10 PM 05/01/2023    4:26 PM  Readmission Risk Prevention Plan  Transportation Screening Complete Complete  PCP or Specialist Appt within 3-5 Days --   HRI or Home Care Consult Complete Complete  Social Work Consult for Recovery Care Planning/Counseling  Complete  Palliative Care Screening Not Applicable   Medication Review Oceanographer) Complete Complete

## 2024-03-03 NOTE — Plan of Care (Signed)
  Problem: Clinical Measurements: Goal: Will remain free from infection Outcome: Progressing   Problem: Activity: Goal: Risk for activity intolerance will decrease Outcome: Progressing   Problem: Elimination: Goal: Will not experience complications related to urinary retention Outcome: Progressing   Problem: Pain Managment: Goal: General experience of comfort will improve and/or be controlled Outcome: Progressing   Problem: Safety: Goal: Ability to remain free from injury will improve Outcome: Progressing   Problem: Skin Integrity: Goal: Risk for impaired skin integrity will decrease Outcome: Progressing

## 2024-03-03 NOTE — Progress Notes (Signed)
 OT Cancellation Note  Patient Details Name: [name] MRN: [id number] DOB: 01-29-1934   Cancelled Treatment:    Reason Eval/Treat Not Completed: OT screened, no needs identified, will sign off, Per chart review, Pt is LTC resident and is non ambulatory at baseline. Pt is hoyer lift transfer and staff assist with all ADLs at bed level. Family reported last admission she is inconsistent with self feeding and needs assistance with that as well at time. Pt does not need skilled OT intervention at this time. OT to complete orders.   Izetta Claude, MS, OTR/L , CBIS ascom [phone]  03/03/24, 11:34 AM

## 2024-03-04 ENCOUNTER — Inpatient Hospital Stay

## 2024-03-04 DIAGNOSIS — R7881 Bacteremia: Secondary | ICD-10-CM | POA: Diagnosis not present

## 2024-03-04 DIAGNOSIS — K751 Phlebitis of portal vein: Secondary | ICD-10-CM

## 2024-03-04 DIAGNOSIS — R627 Adult failure to thrive: Secondary | ICD-10-CM | POA: Diagnosis not present

## 2024-03-04 DIAGNOSIS — B966 Bacteroides fragilis [B. fragilis] as the cause of diseases classified elsewhere: Secondary | ICD-10-CM | POA: Diagnosis not present

## 2024-03-04 DIAGNOSIS — N179 Acute kidney failure, unspecified: Secondary | ICD-10-CM

## 2024-03-04 DIAGNOSIS — K5720 Diverticulitis of large intestine with perforation and abscess without bleeding: Secondary | ICD-10-CM

## 2024-03-04 DIAGNOSIS — I809 Phlebitis and thrombophlebitis of unspecified site: Secondary | ICD-10-CM

## 2024-03-04 DIAGNOSIS — I808 Phlebitis and thrombophlebitis of other sites: Secondary | ICD-10-CM | POA: Diagnosis not present

## 2024-03-04 DIAGNOSIS — G9341 Metabolic encephalopathy: Secondary | ICD-10-CM | POA: Diagnosis not present

## 2024-03-04 DIAGNOSIS — E860 Dehydration: Secondary | ICD-10-CM

## 2024-03-04 LAB — BASIC METABOLIC PANEL WITH GFR
Anion gap: 7 (ref 5–15)
Anion gap: 7 (ref 5–15)
Anion gap: 9 (ref 5–15)
BUN: 29 mg/dL — ABNORMAL HIGH (ref 8–23)
BUN: 32 mg/dL — ABNORMAL HIGH (ref 8–23)
BUN: 35 mg/dL — ABNORMAL HIGH (ref 8–23)
CO2: 24 mmol/L (ref 22–32)
CO2: 24 mmol/L (ref 22–32)
CO2: 25 mmol/L (ref 22–32)
Calcium: 7.7 mg/dL — ABNORMAL LOW (ref 8.9–10.3)
Calcium: 8.2 mg/dL — ABNORMAL LOW (ref 8.9–10.3)
Calcium: 8.2 mg/dL — ABNORMAL LOW (ref 8.9–10.3)
Chloride: 113 mmol/L — ABNORMAL HIGH (ref 98–111)
Chloride: 116 mmol/L — ABNORMAL HIGH (ref 98–111)
Chloride: 118 mmol/L — ABNORMAL HIGH (ref 98–111)
Creatinine, Ser: 0.67 mg/dL (ref 0.44–1.00)
Creatinine, Ser: 0.69 mg/dL (ref 0.44–1.00)
Creatinine, Ser: 0.73 mg/dL (ref 0.44–1.00)
GFR, Estimated: >60 mL/min
GFR, Estimated: >60 mL/min
GFR, Estimated: >60 mL/min
Glucose, Bld: 210 mg/dL — ABNORMAL HIGH (ref 70–99)
Glucose, Bld: 213 mg/dL — ABNORMAL HIGH (ref 70–99)
Glucose, Bld: 263 mg/dL — ABNORMAL HIGH (ref 70–99)
Potassium: 3.9 mmol/L (ref 3.5–5.1)
Potassium: 4 mmol/L (ref 3.5–5.1)
Potassium: 4.3 mmol/L (ref 3.5–5.1)
Sodium: 146 mmol/L — ABNORMAL HIGH (ref 135–145)
Sodium: 147 mmol/L — ABNORMAL HIGH (ref 135–145)
Sodium: 150 mmol/L — ABNORMAL HIGH (ref 135–145)

## 2024-03-04 LAB — TYPE AND SCREEN
ABO/RH(D): A POS
Antibody Screen: NEGATIVE
Unit division: 0

## 2024-03-04 LAB — BPAM RBC
Blood Product Expiration Date: 202601292359
ISSUE DATE / TIME: 202601220434
Unit Type and Rh: 600

## 2024-03-04 LAB — GLUCOSE, CAPILLARY
Glucose-Capillary: 234 mg/dL — ABNORMAL HIGH (ref 70–99)
Glucose-Capillary: 201 mg/dL — ABNORMAL HIGH (ref 70–99)
Glucose-Capillary: 123 mg/dL — ABNORMAL HIGH (ref 70–99)

## 2024-03-04 LAB — CBC
HCT: 24.8 % — ABNORMAL LOW (ref 36.0–46.0)
Hemoglobin: 7.7 g/dL — ABNORMAL LOW (ref 12.0–15.0)
MCH: 29.2 pg (ref 26.0–34.0)
MCHC: 31 g/dL (ref 30.0–36.0)
MCV: 93.9 fL (ref 80.0–100.0)
Platelets: 224 K/uL (ref 150–400)
RBC: 2.64 MIL/uL — ABNORMAL LOW (ref 3.87–5.11)
RDW: 19.7 % — ABNORMAL HIGH (ref 11.5–15.5)
WBC: 12.3 K/uL — ABNORMAL HIGH (ref 4.0–10.5)
nRBC: 0.2 % (ref 0.0–0.2)

## 2024-03-04 LAB — BLOOD GAS, VENOUS

## 2024-03-04 MED ORDER — GLYCOPYRROLATE 0.2 MG/ML IJ SOLN
0.2000 mg | INTRAMUSCULAR | Status: DC | PRN
  Filled 2024-03-04: qty 1

## 2024-03-04 MED ORDER — DEXTROSE 5 % IV SOLN: INTRAVENOUS | Status: DC

## 2024-03-04 MED ORDER — POLYVINYL ALCOHOL 1.4 % OP SOLN
1.0000 [drp] | Freq: Four times a day (QID) | OPHTHALMIC | Status: DC | PRN
  Filled 2024-03-04: qty 15

## 2024-03-04 MED ORDER — ACETAMINOPHEN 325 MG PO TABS: 650.0000 mg | ORAL_TABLET | Freq: Four times a day (QID) | ORAL | Status: DC | PRN

## 2024-03-04 MED ORDER — ACETAMINOPHEN 650 MG RE SUPP: 650.0000 mg | Freq: Four times a day (QID) | RECTAL | Status: DC | PRN

## 2024-03-04 MED ORDER — IOHEXOL 300 MG/ML  SOLN
100.0000 mL | Freq: Once | INTRAMUSCULAR | Status: AC | PRN
  Administered 2024-03-04: 100 mL via INTRAVENOUS

## 2024-03-04 MED ORDER — GLYCOPYRROLATE 1 MG PO TABS
1.0000 mg | ORAL_TABLET | ORAL | Status: DC | PRN
  Filled 2024-03-04: qty 1

## 2024-03-04 NOTE — Plan of Care (Signed)
  Problem: Activity: Goal: Risk for activity intolerance will decrease Outcome: Progressing   Problem: Elimination: Goal: Will not experience complications related to urinary retention Outcome: Progressing   Problem: Pain Managment: Goal: General experience of comfort will improve and/or be controlled Outcome: Progressing   Problem: Safety: Goal: Ability to remain free from injury will improve Outcome: Progressing   Problem: Skin Integrity: Goal: Risk for impaired skin integrity will decrease Outcome: Progressing

## 2024-03-04 NOTE — NC FL2 (Signed)
 " [place]  MEDICAID FL2 LEVEL OF CARE FORM     IDENTIFICATION  Patient Name: [name] Birthdate: 22-Apr-1933 Sex: female Admission Date (Current Location): 03/01/2024  [facility] and [place] Number:  Chiropodist and Address:  [facility], [address] [place], [place] [address]      Provider Number: [id number]  Attending Physician Name and Address:  Jerelene Critchley, MD  Relative Name and Phone Number:       Current Level of Care: SNF Recommended Level of Care: Skilled Nursing Facility Prior Approval Number:    Date Approved/Denied:   PASRR Number: [id number]  Discharge Plan: SNF    Current Diagnoses: Patient Active Problem List   Diagnosis Date Noted   Failure to thrive in adult 03/01/2024   S/P AKA (above knee amputation) unilateral, left (HCC) 12/10/2023   Acute osteomyelitis of toe, left (HCC) 12/04/2023   Critical limb ischemia of left lower extremity (HCC) 11/06/2023   Ischemic foot ulcer due to atherosclerosis of native artery of limb (HCC) 11/06/2023   Protein-calorie malnutrition, severe 04/30/2023   Gangrene of toe (HCC) 04/28/2023   Dementia (HCC) 04/09/2023   DJD (degenerative joint disease) 01/21/2023   Ischemia of left lower extremity 08/27/2022   Osteomyelitis of left foot (HCC) 08/27/2022   Gangrene of toe of left foot (HCC) 08/26/2022   Acute osteomyelitis of left foot (HCC) 08/08/2022   Diabetic foot infection (HCC) 08/07/2022   Hemiplegia affecting left side in left-dominant patient as late effect of cerebrovascular disease (HCC) 01/10/2022   Vaginal candidiasis 01/10/2022   Leg wound, left 01/08/2022   Hypoglycemia 01/07/2022   Leukocytosis 01/07/2022   MRI contraindicated due to metal implant 01/07/2022   Open wound of left foot 01/07/2022   Atherosclerosis of native arteries of the extremities with ulceration (HCC) 06/06/2021   Pressure injury of skin 06/01/2021   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    PAD (peripheral artery disease) 05/17/2021   Right hemiparesis (HCC) 05/16/2021   Cellulitis of left foot 05/15/2021   Hypokalemia 05/15/2021   History of stroke 10/07/2019   Altered mental status 10/06/2019   Carotid artery disease 09/28/2019   Left hemiparesis (HCC) 09/28/2019   Hemiplegia and hemiparesis following cerebral infarction affecting right dominant side (HCC) 09/27/2019   Primary osteoarthritis of both hips 11/06/2017   Microalbuminuria 11/02/2017   Cystocele, midline 09/04/2017   Incomplete uterine prolapse 09/04/2017   Ankle edema 04/27/2017   Malnutrition of moderate degree 01/15/2016   Dehydration    Incarcerated ventral hernia    Diabetic ketoacidosis without coma associated with type 2 diabetes mellitus (HCC)    Type 2 diabetes mellitus with polyneuropathy (HCC) 08/06/2015   Carpal tunnel syndrome, bilateral 07/10/2015   Osteopenia of multiple sites 07/10/2015   Arthralgia of multiple sites 06/27/2015   Mixed hyperlipidemia 11/06/2014   Hypomagnesemia 11/02/2013   Essential hypertension 11/02/2013    Orientation RESPIRATION BLADDER Height & Weight     Self  Normal Incontinent Weight: 52.4 kg Height:  5' 4 (162.6 cm)  BEHAVIORAL SYMPTOMS/MOOD NEUROLOGICAL BOWEL NUTRITION STATUS      Incontinent    AMBULATORY STATUS COMMUNICATION OF NEEDS Skin   Total Care Does not communicate PU Stage and Appropriate Care (Pressure in jury sacrum stage 2 - partial thickness loss of dermis. Foam dressing)   PU Stage 2 Dressing: Daily                   Personal Care Assistance Level of Assistance  Total care  Total Care Assistance: Maximum assistance   Functional Limitations Info  Sight, Hearing, Speech Sight Info: Impaired Hearing Info: Impaired Speech Info: Impaired    SPECIAL CARE FACTORS FREQUENCY                       Contractures Contractures Info: Present (lower left extremity)    Additional Factors Info  Code Status, Allergies Code  Status Info: DNR Allergies Info: Metformin  And Related           Current Medications (03/04/2024):  This is the current hospital active medication list Current Facility-Administered Medications  Medication Dose Route Frequency Provider Last Rate Last Admin   acetaminophen  (TYLENOL ) tablet 650 mg  650 mg Oral Q6H PRN Fernand Prost, MD       Or   acetaminophen  (TYLENOL ) suppository 650 mg  650 mg Rectal Q6H PRN Khan, Ghalib, MD   650 mg at 03/03/24 1002   dextrose  5 % solution   Intravenous Continuous Ponnala, Shruthi, MD 70 mL/hr at 03/04/24 1458 Restarted at 03/04/24 1458   insulin  aspart (novoLOG ) injection 0-9 Units  0-9 Units Subcutaneous TID WC Khan, Ghalib, MD   3 Units at 03/04/24 1212   ondansetron  (ZOFRAN ) tablet 4 mg  4 mg Oral Q6H PRN Fernand Prost, MD       Or   ondansetron  (ZOFRAN ) injection 4 mg  4 mg Intravenous Q6H PRN Khan, Ghalib, MD       Oral care mouth rinse  15 mL Mouth Rinse 4 times per day Jerelene Critchley, MD   15 mL at 03/04/24 1213   Oral care mouth rinse  15 mL Mouth Rinse PRN Ponnala, Shruthi, MD       piperacillin -tazobactam (ZOSYN ) IVPB 3.375 g  3.375 g Intravenous Q8H Donati-Garmon, Natalie M, NP 12.5 mL/hr at 03/04/24 1458 3.375 g at 03/04/24 1458   senna-docusate (Senokot-S) tablet 1 tablet  1 tablet Oral QHS PRN Fernand Prost, MD       sodium chloride  flush (NS) 0.9 % injection 3 mL  3 mL Intravenous Q12H Khan, Ghalib, MD   3 mL at 03/04/24 9193     Discharge Medications: Please see discharge summary for a list of discharge medications.  Relevant Imaging Results:  Relevant Lab Results:   Additional Information SSN: [id number]  Shasta DELENA Daring, RN     "

## 2024-03-04 NOTE — Progress Notes (Signed)
 SLP Follow up Note  Patient Details Name: LINDAY RHODES MRN: [id number] DOB: 05-28-1933  Per chart, decision made to initial comfort care measures.   ST will sign off.   Nazim Kadlec B. Rubbie, M.S., CCC-SLP, Tree Surgeon Certified Brain Injury Specialist [facility]  [facility] Services Office [phone] Ascom [phone] Fax [phone]

## 2024-03-04 NOTE — Evaluation (Signed)
 Clinical/Bedside Swallow Evaluation Patient Details  Name: [name] MRN: [id number] Date of Birth: 24-Jan-1934  Today's Date: 03/04/2024 Time: SLP Start Time (ACUTE ONLY): 1340 SLP Stop Time (ACUTE ONLY): 1355 SLP Time Calculation (min) (ACUTE ONLY): 15 min  Past Medical History:  Past Medical History:  Diagnosis Date   Carpal tunnel syndrome on both sides    CVA (cerebral vascular accident) (HCC) 11/14/2019   Diabetes (HCC)    Diabetic polyneuropathy (HCC)    Diverticulitis    Hyperlipidemia    Hypertension    Osteoarthritis    Sepsis (HCC) 03/22/2018   Suspected UTI 04/09/2023   UTI (urinary tract infection) 09/20/2019   Vaginal prolapse    Past Surgical History:  Past Surgical History:  Procedure Laterality Date   AMPUTATION Left 05/01/2023   Procedure: AMPUTATION, FOOT, RAY;  Surgeon: Lennie Barter, DPM;  Location: [facility] ORS;  Service: Orthopedics/Podiatry;  Laterality: Left;  LEFT PARTIAL FIRST RAY   AMPUTATION Left 12/09/2023   Procedure: AMPUTATION, ABOVE KNEE;  Surgeon: Marea Selinda RAMAN, MD;  Location: [facility] OR[facility];  Service: Vascular;  Laterality: Left;   AMPUTATION TOE Left 08/13/2022   Procedure: EXCISION OF 5TH RAY;  Surgeon: Ashley Soulier, DPM;  Location: [facility] OR[facility];  Service: Orthopedics/Podiatry;  Laterality: Left;   CESAREAN SECTION     IRRIGATION AND DEBRIDEMENT FOOT Left 01/09/2022   Procedure: IRRIGATION AND DEBRIDEMENT FOOT WITH BONE BIOPSY;  Surgeon: Ashley Soulier, DPM;  Location: [facility] OR[facility];  Service: Podiatry;  Laterality: Left;   LOWER EXTREMITY ANGIOGRAPHY Left 06/11/2021   Procedure: Lower Extremity Angiography;  Surgeon: Jama Cordella KANDICE, MD;  Location: [facility] INVASIVE [facility];  Service: Cardiovascular;  Laterality: Left;   LOWER EXTREMITY ANGIOGRAPHY Left 08/11/2022   Procedure: Lower Extremity Angiography;  Surgeon: Jama Cordella KANDICE, MD;  Location: [facility];  Service: Cardiovascular;  Laterality: Left;   LOWER EXTREMITY ANGIOGRAPHY Left 04/30/2023    Procedure: Lower Extremity Angiography;  Surgeon: Marea Selinda RAMAN, MD;  Location: [facility];  Service: Cardiovascular;  Laterality: Left;   LOWER EXTREMITY ANGIOGRAPHY Left 11/06/2023   Procedure: Lower Extremity Angiography;  Surgeon: Jama Cordella KANDICE, MD;  Location: [facility];  Service: Cardiovascular;  Laterality: Left;   pessary     REPLACEMENT TOTAL KNEE BILATERAL     VENTRAL HERNIA REPAIR     VENTRAL HERNIA REPAIR N/A 01/13/2016   Procedure: HERNIA REPAIR VENTRAL ADULT WITH SMALL BOWEL RESECTION, LYSIS OF ADHESIONS;  Surgeon: Dorothyann LITTIE Husk, MD;  Location: [facility] OR[facility];  Service: General;  Laterality: N/A;   HPI:  [name] is a 89 y.o. year old female with medical history of hypertension, hyperlipidemia, type 2 diabetes, history of osteomyelitis status post left AKA, dementia presenting to the ED on 03/01/2024 after having worsening confusion at the facility. Per EDP, patient has legal guardian at DSS and per report her family should not be contacted.  Patient was admitted with altered mental status, suspected due to UTI and hypernatremia, AKI.       CT Head 03/02/2024 revealed 1. No acute or new intracranial abnormality. Cerebral white matter disease,  left inferior frontal gyrus encephalomalacia.  2. Asymmetric left mesial temporal lobe atrophy appears progressed since 2023.  3. 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      DG Chest on 03/01/2024 was negative for active disease.    Assessment / Plan / Recommendation  Clinical Impression  ST services consulted for a bedside swallow evaluation d/t improved mentation.  Pt was sleeping upon SLP arrived to easily aroused to her name and touch. She was able to maintain alertness and give me her name.       Of note, chart review reveals note on 03/01/2024 which states When EMS arrived they reported that the patient was awake and alert staff had reported to EMS that the patient had  last ate yesterday but EMS reported that they saw food in the patient's mouth.      During this evaluation, pt presents with what appears to be a cognition based profound oral phase dysphagia c/b almost inattention to oral boluses. Suspect this is related to her late stage dementia and given EMS report, suspect this is her new baseline prior to this admission. Pt opened her mouth when presented with spoon but didn't demonstrate any oral movement to retrieve bolus from spoon. Puree bolus and nectar thick liquids bolus provided with very minimal oral response from pt beyond closing her mouth. This clinical research associate scooped out puree bolus and with maximal encouragement and extended amount of time pt eventually swallowed bolus of nectar thick liquids.       At this time, pt's risk of aspiration, malnutrition and dehydration with PO intake is very high. As such a safe diet cannot be recommended. ST to follow for PO readiness but would recommend consult to Palliative Care to determine goals of care given pt's age and advanced dementia. SLP Visit Diagnosis: Dysphagia, oral phase (R13.11)    Aspiration Risk  Severe aspiration risk    Diet Recommendation    NPO       Other Recommendations Oral Care Recommendations: Oral care QID     Swallow Evaluation Recommendations Recommendations: NPO Medication Administration: Via alternative means Oral care recommendations: Oral care QID (4x/day) Recommended consults: Consider Palliative care      Functional Status Assessment Patient has had a recent decline in their functional status and/or demonstrates limited ability to make significant improvements in function in a reasonable and predictable amount of time  Frequency and Duration min 2x/week  2 weeks       Prognosis Prognosis for improved oropharyngeal function: Guarded Barriers to Reach Goals: Cognitive deficits;Severity of deficits;Time post onset      Swallow Study   General Date of Onset: 03/01/24 HPI:  [name] is a 89 y.o. year old female with medical history of hypertension, hyperlipidemia, type 2 diabetes, history of osteomyelitis status post left AKA, dementia presenting to the ED on 03/01/2024 after having worsening confusion at the facility. Per EDP, patient has legal guardian at DSS and per report her family should not be contacted.  Patient was admitted with altered mental status, suspected due to UTI and hypernatremia, AKI.     CT Head 03/02/2024 revealed 1. No acute or new intracranial abnormality. Cerebral white matter disease,  left inferior frontal gyrus encephalomalacia.  2. Asymmetric left mesial temporal lobe atrophy appears progressed since 2023.  3. 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    DG Chest on 03/01/2024 was negative for active disease. Type of Study: Bedside Swallow Evaluation Previous Swallow Assessment: 09/2019 Diet Prior to this Study: NPO Temperature Spikes Noted: No Respiratory Status: Room air History of Recent Intubation: No Behavior/Cognition: Alert;Doesn't follow directions;Confused;Pleasant mood Oral Cavity Assessment: Within Functional Limits Oral Care Completed by SLP: Recent completion by staff Oral Cavity - Dentition: Edentulous Self-Feeding Abilities: Total assist Patient Positioning: Upright in bed Baseline Vocal Quality: Normal Volitional Cough: Cognitively unable to elicit Volitional Swallow: Unable to  elicit    Oral/Motor/Sensory Function Overall Oral Motor/Sensory Function:  (unable to follow directions, no focal deficits observed when consuming PO trials)   Ice Chips Ice chips: Not tested   Thin Liquid Thin Liquid: Not tested    Nectar Thick Nectar Thick Liquid: Impaired Presentation: Spoon Oral Phase Impairments: Reduced labial seal;Reduced lingual movement/coordination;Poor awareness of bolus Oral phase functional implications: Oral holding Pharyngeal Phase Impairments: Suspected delayed  Swallow   Honey Thick Honey Thick Liquid: Not tested   Puree Puree: Impaired Presentation: Spoon Oral Phase Impairments: Reduced labial seal;Reduced lingual movement/coordination;Poor awareness of bolus Oral Phase Functional Implications: Oral holding Other Comments: SLP scooped puree boluse of pt's mouth   Solid     Solid: Not tested     Lorenz Donley B. Rubbie, M.S., CCC-SLP, Tree Surgeon Certified Brain Injury Specialist [facility]  [facility] Rehabilitation Services Office [phone] Ascom [phone] Fax [phone]

## 2024-03-04 NOTE — Consult Note (Signed)
 NAME: [name]  DOB: 1933/07/30  MRN: [id number]  Date/Time: 03/04/2024 2:03 PM  REQUESTING PROVIDER: Dr.Ponnala Subjective:  REASON FOR CONSULT: Bacteroides bacteremia ?No history from patient , chart reviewed [name] is a 89 y.o. with a history of DM, HTN, HLD, PAD, CAD, CVA left hemiparesis recently in hospital 12/04/23 -12/12/23 left AKA on 12/09/23 for wounds secondary to PAD, post op blood loss anemia and received PRBC  [facility] to [facility] and unresponsiveness   03/01/24 12:38  BP 114/68  Temp 98.5 F (36.9 C)  Pulse Rate 112 !  Resp 18  SpO2 98 %     Latest Reference Range & Units 03/01/24 16:54  WBC 4.0 - 10.5 K/uL 16.5 (H)  Hemoglobin 12.0 - 15.0 g/dL 8.2 (L)  HCT 63.9 - 53.9 % 28.3 (L)  Platelets 150 - 400 K/uL 413 (H)  Creatinine 0.44 - 1.00 mg/dL 8.63 (H)  BC was sent on 1/21 CT head no acute findings Old changes UA 15-20 WBC No acute or new intracranial abnormality. Cerebral white matter disease, left inferior frontal gyrus encephalomalacia. 2. Asymmetric left mesial temporal lobe atrophy appears progressed since 2023.  I am seeing the patient for bacteroides fragilis bacteremia Past Medical History:  Diagnosis Date   Carpal tunnel syndrome on both sides    CVA (cerebral vascular accident) (HCC) 11/14/2019   Diabetes (HCC)    Diabetic polyneuropathy (HCC)    Diverticulitis    Hyperlipidemia    Hypertension    Osteoarthritis    Sepsis (HCC) 03/22/2018   Suspected UTI 04/09/2023   UTI (urinary tract infection) 09/20/2019   Vaginal prolapse     Past Surgical History:  Procedure Laterality Date   AMPUTATION Left 05/01/2023   Procedure: AMPUTATION, FOOT, RAY;  Surgeon: Lennie Barter, DPM;  Location: [facility];  Service: Orthopedics/Podiatry;  Laterality: Left;  LEFT PARTIAL FIRST RAY   AMPUTATION Left 12/09/2023   Procedure: AMPUTATION, ABOVE KNEE;  Surgeon: Marea Selinda RAMAN, MD;  Location: [facility];   Service: Vascular;  Laterality: Left;   AMPUTATION TOE Left 08/13/2022   Procedure: EXCISION OF 5TH RAY;  Surgeon: Ashley Soulier, DPM;  Location: [facility];  Service: Orthopedics/Podiatry;  Laterality: Left;   CESAREAN SECTION     IRRIGATION AND DEBRIDEMENT FOOT Left 01/09/2022   Procedure: IRRIGATION AND DEBRIDEMENT FOOT WITH BONE BIOPSY;  Surgeon: Ashley Soulier, DPM;  Location: [facility];  Service: Podiatry;  Laterality: Left;   LOWER EXTREMITY ANGIOGRAPHY Left 06/11/2021   Procedure: Lower Extremity Angiography;  Surgeon: Jama Cordella KANDICE, MD;  Location: [facility];  Service: Cardiovascular;  Laterality: Left;   LOWER EXTREMITY ANGIOGRAPHY Left 08/11/2022   Procedure: Lower Extremity Angiography;  Surgeon: Jama Cordella KANDICE, MD;  Location: [facility];  Service: Cardiovascular;  Laterality: Left;   LOWER EXTREMITY ANGIOGRAPHY Left 04/30/2023   Procedure: Lower Extremity Angiography;  Surgeon: Marea Selinda RAMAN, MD;  Location: [facility];  Service: Cardiovascular;  Laterality: Left;   LOWER EXTREMITY ANGIOGRAPHY Left 11/06/2023   Procedure: Lower Extremity Angiography;  Surgeon: Jama Cordella KANDICE, MD;  Location: [facility];  Service: Cardiovascular;  Laterality: Left;   pessary     REPLACEMENT TOTAL KNEE BILATERAL     VENTRAL HERNIA REPAIR     VENTRAL HERNIA REPAIR N/A 01/13/2016   Procedure: HERNIA REPAIR VENTRAL ADULT WITH SMALL BOWEL RESECTION, LYSIS OF ADHESIONS;  Surgeon: Dorothyann LITTIE Husk, MD;  Location: [facility];  Service: General;  Laterality: N/A;    Social History   Socioeconomic History   Marital status: Widowed    Spouse name: Not on file   Number of children: Not on file   Years of education: Not on file   Highest education level: Not on file  Occupational History   Not on file  Tobacco Use   Smoking status: Never   Smokeless tobacco: Never  Vaping Use   Vaping status: Never Used  Substance and Sexual Activity   Alcohol  use: No   Drug  use: No   Sexual activity: Not Currently  Other Topics Concern   Not on file  Social History Narrative   Lives at home by herself. Ambulates with a cane.   Social Drivers of Health   Tobacco Use: Low Risk (03/01/2024)   Patient History    Smoking Tobacco Use: Never    Smokeless Tobacco Use: Never    Passive Exposure: Not on file  Financial Resource Strain: Not on file  Food Insecurity: Patient Unable To Answer (12/05/2023)   Epic    Worried About Programme Researcher, Broadcasting/film/video in the Last Year: Patient unable to answer    Ran Out of Food in the Last Year: Patient unable to answer  Transportation Needs: Patient Unable To Answer (12/05/2023)   Epic    Lack of Transportation (Medical): Patient unable to answer    Lack of Transportation (Non-Medical): Patient unable to answer  Physical Activity: Not on file  Stress: Not on file  Social Connections: Patient Unable To Answer (12/05/2023)   Social Connection and Isolation Panel    Frequency of Communication with Friends and Family: Patient unable to answer    Frequency of Social Gatherings with Friends and Family: Patient unable to answer    Attends Religious Services: Patient unable to answer    Active Member of Clubs or Organizations: Patient unable to answer    Attends Banker Meetings: Patient unable to answer    Marital Status: Patient unable to answer  Intimate Partner Violence: Patient Unable To Answer (12/05/2023)   Epic    Fear of Current or Ex-Partner: Patient unable to answer    Emotionally Abused: Patient unable to answer    Physically Abused: Patient unable to answer    Sexually Abused: Patient unable to answer  Depression (PHQ2-9): Not on file  Alcohol  Screen: Not on file  Housing: Unknown (12/05/2023)   Epic    Unable to Pay for Housing in the Last Year: Patient unable to answer    Number of Times Moved in the Last Year: 0    Homeless in the Last Year: Patient unable to answer  Utilities: Patient Unable To  Answer (12/05/2023)   Epic    Threatened with loss of utilities: Patient unable to answer  Health Literacy: Not on file    Family History  Problem Relation Age of Onset   Breast cancer Mother 49   Breast cancer Maternal Aunt    Allergies[1] I? Current Facility-Administered Medications  Medication Dose Route Frequency Provider Last Rate Last Admin   acetaminophen  (TYLENOL ) tablet 650 mg  650 mg Oral Q6H PRN Fernand Prost, MD       Or   acetaminophen  (TYLENOL ) suppository 650 mg  650 mg Rectal Q6H PRN Khan, Ghalib, MD   650 mg at 03/03/24 1002   dextrose  5 % solution   Intravenous Continuous Ponnala, Shruthi, MD       insulin  aspart (novoLOG ) injection 0-9 Units  0-9 Units Subcutaneous TID WC Khan, Ghalib, MD   3 Units at 03/04/24 1212   ondansetron  (ZOFRAN ) tablet 4 mg  4 mg Oral Q6H PRN Fernand Prost, MD       Or   ondansetron  (ZOFRAN ) injection 4 mg  4 mg Intravenous Q6H PRN Khan, Ghalib, MD       Oral care mouth rinse  15 mL Mouth Rinse 4 times per day Jerelene Critchley, MD   15 mL at 03/04/24 1213   Oral care mouth rinse  15 mL Mouth Rinse PRN Ponnala, Shruthi, MD       piperacillin -tazobactam (ZOSYN ) IVPB 3.375 g  3.375 g Intravenous Q8H Donati-Garmon, Natalie M, NP   Stopped at 03/04/24 9061   senna-docusate (Senokot-S) tablet 1 tablet  1 tablet Oral QHS PRN Fernand Prost, MD       sodium chloride  flush (NS) 0.9 % injection 3 mL  3 mL Intravenous Q12H Khan, Ghalib, MD   3 mL at 03/04/24 0806     Abtx:  Anti-infectives (From admission, onward)    Start     Dose/Rate Route Frequency Ordered Stop   03/03/24 2215  piperacillin -tazobactam (ZOSYN ) IVPB 3.375 g        3.375 g 12.5 mL/hr over 240 Minutes Intravenous Every 8 hours 03/03/24 2123     03/02/24 1700  cefTRIAXone  (ROCEPHIN ) 1 g in sodium chloride  0.9 % 100 mL IVPB  Status:  Discontinued        1 g 200 mL/hr over 30 Minutes Intravenous Every 24 hours 03/01/24 2337 03/03/24 2123   03/01/24 1730  cefTRIAXone  (ROCEPHIN ) 1 g in  sodium chloride  0.9 % 100 mL IVPB        1 g 200 mL/hr over 30 Minutes Intravenous  Once 03/01/24 1728 03/01/24 1845       REVIEW OF SYSTEMS:  NA Objective:  VITALS:  BP (!) 119/57 (BP Location: Left Arm)   Pulse 76   Temp 97.9 F (36.6 C)   Resp 16   Ht 5' 4 (1.626 m)   Wt 52.4 kg   SpO2 100%   BMI 19.83 kg/m   PHYSICAL EXAM:  General: Awake, but oriented only in person- trying to say some wors Head: Normocephalic, without obvious abnormality, atraumatic. Eyes: Conjunctivae clear, anicteric sclerae. Pupils are equal ENT Nares normal. No drainage or sinus tenderness. Lips, mucosa, and tongue normal. No Thrush Neck: Supple, symmetrical, no adenopathy, thyroid : non tender no carotid bruit and no JVD. Lungs: b/l air entry Heart: Regular rate and rhythm, no murmur, rub or gallop. Abdomen: Soft,tender left side . Bowel sounds normal. No masses Extremities: left AKA - stump fine Skin: No rashes or lesions. Or bruising Lymph: Cervical, supraclavicular normal. Neurologic: cannot assess Pertinent Labs Lab Results CBC    Component Value Date/Time   WBC 12.3 (H) 03/04/2024 0508   RBC 2.64 (L) 03/04/2024 0508   HGB 7.7 (L) 03/04/2024 0508   HGB 8.6 (L) 09/25/2013 0410   HCT 24.8 (L) 03/04/2024 0508   HCT 27.0 (L) 09/24/2013 0409   PLT 224 03/04/2024 0508   PLT 192 09/24/2013 0409   MCV 93.9 03/04/2024 0508   MCV 96 09/24/2013 0409   MCH 29.2 03/04/2024 0508   MCHC 31.0 03/04/2024 0508   RDW 19.7 (H) 03/04/2024 0508   RDW 13.8 09/24/2013 0409   LYMPHSABS 1.7 12/04/2023 1211   LYMPHSABS 2.5 09/24/2013 0409   MONOABS 0.7 12/04/2023 1211   MONOABS 0.7 09/24/2013 0409   EOSABS 0.2 12/04/2023  1211   EOSABS 0.4 09/24/2013 0409   BASOSABS 0.0 12/04/2023 1211   BASOSABS 0.0 09/24/2013 0409       Latest Ref Rng & Units 03/04/2024   11:59 AM 03/04/2024    5:08 AM 03/03/2024   11:30 PM  CMP  Glucose 70 - 99 mg/dL 786  736  789   BUN 8 - 23 mg/dL 29  32  35    Creatinine 0.44 - 1.00 mg/dL 9.26  9.30  9.32   Sodium 135 - 145 mmol/L 146  147  150   Potassium 3.5 - 5.1 mmol/L 3.9  4.0  4.3   Chloride 98 - 111 mmol/L 113  116  118   CO2 22 - 32 mmol/L 24  24  25    Calcium  8.9 - 10.3 mg/dL 7.7  8.2  8.2       Microbiology: Recent Results (from the past 240 hours)  Urine Culture (for pregnant, neutropenic or urologic patients or patients with an indwelling urinary catheter)     Status: None   Collection Time: 03/01/24  4:04 PM   Specimen: Urine, Clean Catch  Result Value Ref Range Status   Specimen Description   Final    URINE, CLEAN CATCH Performed at [facility] [address], [place], [place] [address]    Special Requests   Final    NONE Performed at [facility], [address] [place], [place] [address]    Culture   Final    NO GROWTH Performed at [facility], [address], [place], [place] [address]    Report Status 03/03/2024 FINAL  Final  CULTURE, BLOOD (ROUTINE X 2) w Reflex to ID Panel     Status: None (Preliminary result)   Collection Time: 03/02/24 11:09 AM   Specimen: BLOOD LEFT ARM  Result Value Ref Range Status   Specimen Description   Final    BLOOD LEFT ARM Performed at [facility], [address], [place], [place] [address]    Special Requests   Final    BOTTLES DRAWN AEROBIC AND ANAEROBIC Blood Culture adequate volume Performed at [facility], [address] [place], [place] [address]    Culture  Setup Time   Final    GRAM NEGATIVE RODS ANAEROBIC BOTTLE ONLY CRITICAL RESULT CALLED TO, READ BACK BY AND VERIFIED WITH: ALEX CHAPPELL @2103  ON 03/03/24 SKL    Culture   Final    GRAM NEGATIVE RODS CULTURE REINCUBATED FOR BETTER GROWTH Performed at [name], [address] [place], [place] [address]    Report Status PENDING  Incomplete  Blood Culture ID Panel (Reflexed)     Status: Abnormal   Collection Time: 03/02/24 11:09 AM  Result Value Ref Range  Status   Enterococcus faecalis NOT DETECTED NOT DETECTED Final   Enterococcus Faecium NOT DETECTED NOT DETECTED Final   Listeria monocytogenes NOT DETECTED NOT DETECTED Final   Staphylococcus species NOT DETECTED NOT DETECTED Final   Staphylococcus aureus (BCID) NOT DETECTED NOT DETECTED Final   Staphylococcus epidermidis NOT DETECTED NOT DETECTED Final   Staphylococcus lugdunensis NOT DETECTED NOT DETECTED Final   Streptococcus species NOT DETECTED NOT DETECTED Final   Streptococcus agalactiae NOT DETECTED NOT DETECTED Final   Streptococcus pneumoniae NOT DETECTED NOT DETECTED Final   Streptococcus pyogenes NOT DETECTED NOT DETECTED Final   A.calcoaceticus-baumannii NOT DETECTED NOT DETECTED Final   Bacteroides fragilis DETECTED (A) NOT DETECTED Final    Comment:  CRITICAL RESULT CALLED TO, READ BACK BY AND VERIFIED WITH: ALEX CHAPPELL @2103  ON 03/03/24 SKL    Enterobacterales NOT DETECTED NOT DETECTED Final   Enterobacter cloacae complex NOT DETECTED NOT DETECTED Final   Escherichia coli NOT DETECTED NOT DETECTED Final   Klebsiella aerogenes NOT DETECTED NOT DETECTED Final   Klebsiella oxytoca NOT DETECTED NOT DETECTED Final   Klebsiella pneumoniae NOT DETECTED NOT DETECTED Final   Proteus species NOT DETECTED NOT DETECTED Final   Salmonella species NOT DETECTED NOT DETECTED Final   Serratia marcescens NOT DETECTED NOT DETECTED Final   Haemophilus influenzae NOT DETECTED NOT DETECTED Final   Neisseria meningitidis NOT DETECTED NOT DETECTED Final   Pseudomonas aeruginosa NOT DETECTED NOT DETECTED Final   Stenotrophomonas maltophilia NOT DETECTED NOT DETECTED Final   Candida albicans NOT DETECTED NOT DETECTED Final   Candida auris NOT DETECTED NOT DETECTED Final   Candida glabrata NOT DETECTED NOT DETECTED Final   Candida krusei NOT DETECTED NOT DETECTED Final   Candida parapsilosis NOT DETECTED NOT DETECTED Final   Candida tropicalis NOT DETECTED NOT DETECTED Final   Cryptococcus  neoformans/gattii NOT DETECTED NOT DETECTED Final    Comment: Performed at [facility], [address] [place], [place] [address]  MRSA Next Gen by PCR, Nasal     Status: None   Collection Time: 03/03/24 12:40 PM   Specimen: Nasal Mucosa; Nasal Swab  Result Value Ref Range Status   MRSA by PCR Next Gen NOT DETECTED NOT DETECTED Final    Comment: (NOTE) The GeneXpert MRSA Assay (FDA approved for NASAL specimens only), is one component of a comprehensive MRSA colonization surveillance program. It is not intended to diagnose MRSA infection nor to guide or monitor treatment for MRSA infections. Test performance is not FDA approved in patients less than 24 years old. Performed at [facility], [address] [place], [place] [address]      Patient has: []  acute illness w/systemic sxs  [mod] []  illness posing risk to life or function  [high]  I reviewed:  (3+) []  primary team note []  consultant note(s) []  procedure/op note(s) []  micro result(s)   []  CBC results []  chemistry results []  radiology report(s) []  nursing note(s)  I independently visualized:  (any)   []  cxs/plates in lab []  plain film images []  CT images []  PET images   []  path slide(s) []  ECG tracing []  MRI images []  nuclear scan  I discussed: (any) []  micro and/or path w/lab personnel []  drug options and/or interactions w/ID pharmD   []  procedure/OR findings w/other MD(s) []  echo and/or imaging w/other MD(s)   []  mgm't w/attending(s) involved in case []  setting up home abx w/OPAT team  Mgm't requires: []  prescription drug(s)  [mod] []  intensive toxicity monitoring  [high]   IMAGING RESULTS: CT abdomen reviewed with Radiologist in person Inferior mesenteric vein thrombosis -  Near the Proximal segment of the sigmoid colon is multilobular fluid densitiy concerning for peridiverticular abscess possibly due to acute diverticuliitis Portal vein non occlusive thrombosis       I have personally reviewed  the films   EKG- T wave inversion in anterolateral and inferior leads ?     Patient has: []  acute illness w/systemic sxs  [mod] [x]  illness posing risk to life or function  [high]  I reviewed:  (3+) [x]  primary team note []  consultant note(s) []  procedure/op note(s) []  micro result(s)   [x]  CBC results [x]  chemistry results [x]  radiology report(s) []  nursing note(s)  I independently visualized:  (any)   []  cxs/plates in lab []  plain film images [x]  CT images []  PET images   []  path slide(s) []  ECG tracing []  MRI images []  nuclear scan  I discussed: (any) []  micro and/or path w/lab personnel [x]  drug options and/or interactions w/ID pharmD   [x]  procedure/OR findings w/other MD(s) []  echo and/or imaging w/other MD(s)   [x]  mgm't w/attending(s) involved in case []  setting up home abx w/OPAT team  Mgm't requires: []  prescription drug(s)  [mod] [x]  intensive toxicity monitoring  [high]   Impression/Recommendation ?[name] is a 89 y.o. with a history of DM, HTN, HLD, PAD, CAD, CVA left hemiparesis recently in hospital 12/04/23 -12/12/23 left AKA on 12/09/23 for wounds secondary to PAD, post op blood loss anemia and received PRBC  [facility] and unresponsiveness   Sepsis secondary to Bacteroides Fragilis bacteremia due to diverticular abscess and septic thrombophlebitis of Inferior mesenteric vein, and portal vein  Pt is on zosyn  - can deescalate to unasyn She needs Anticoagulation Discussed with IR Dr.Henn , aspiration is not feasible-   Encephalopathy- metabolic Sepsis Hypernatremia  AKI due to infection, dehydration   Anemia Recent blood transfusion during last hospitalization  CAD  PAD  H/o CVA  This consult involved complex infectious pathology and antimicrobial management   ________________________________________________ Discussed with  Guardian at bed side  Discussed with Dr.Ponnala in detail      [1]  Allergies Allergen Reactions   Metformin  And Related Nausea And Vomiting

## 2024-03-04 NOTE — Plan of Care (Signed)
 Discussed with guardian [name], about prognosis CT with findings worrisome for cholangitis, no CBD dilation or obstructive choledocholithiasis Nonocclusive portal vein thrombosis Also found to have thrombosed vessel in the left abdomen -thrombosed IMV Has multilobular fluid density structure measuring 2.1 x 3.2 cm worrisome for small intra-abdominal abscess, possibly due to acute diverticulitis  Patient at baseline has dementia, AO x 1 with poor quality of life Guardian [name], also discussed with son and decided to go ahead with comfort care and does not want to prolong poor quality of life

## 2024-03-04 NOTE — Care Management Important Message (Signed)
 Important Message  Patient Details  Name: [name] MRN: [id number] Date of Birth: [birth year]   Important Message Given:  Yes - Medicare IM     Laylah Riga W, CMA 03/04/2024, 2:05 PM

## 2024-03-04 NOTE — Progress Notes (Addendum)
 " PROGRESS NOTE    [name]  FMW:[id number] DOB: [birth year] DOA: 03/01/2024 PCP: Rudolpho Norleen BIRCH, MD  Chief Complaint  Patient presents with   evaluation    Hospital Course:  [name] is a 89 y.o. year old female with medical history of hypertension, hyperlipidemia, type 2 diabetes, history of osteomyelitis status post left AKA, dementia presenting to the ED after having worsening confusion at the facility. Per EDP, patient has legal guardian at DSS and per report her family should not be contacted.  Patient was admitted with altered mental status, suspected due to UTI and hypernatremia, AKI. Hospital course as below  Subjective: Patient was examined at the bedside, mental status improved today, was able to tell me her name Seen by SLP, n.p.o. -high risk of aspiration and recommend palliative care eval Seen by ID, appreciate recs CT with findings of cholangitis, portal vein thrombosis, thrombosed IMV, small intra-abdominal abscess likely due to acute diverticulitis Discussed with [name], guardian -decided to go ahead with comfort care   Objective: Vitals:   03/04/24 0500 03/04/24 0743 03/04/24 1235 03/04/24 1548  BP:  (!) 116/57 (!) 119/57 (!) 120/58  Pulse:  80 76 81  Resp:  17 16 17   Temp:  98.3 F (36.8 C) 97.9 F (36.6 C) 98.7 F (37.1 C)  TempSrc:      SpO2:  100% 100% 100%  Weight: 52.4 kg     Height:        Intake/Output Summary (Last 24 hours) at 03/04/2024 1747 Last data filed at 03/04/2024 1726 Gross per 24 hour  Intake 3660.55 ml  Output 1050 ml  Net 2610.55 ml   Filed Weights   03/02/24 2014 03/03/24 0500 03/04/24 0500  Weight: 45.9 kg 50.3 kg 52.4 kg    Examination: Gen: NAD CV: normal heart sounds Lung: CTAB Abd: No TTP, normal bowel sounds MSK: No asymmetry, good bulk and tone Neuro: alert and oriented  Assessment & Plan:  [name] is a 89 y.o. year old female with medical history of hypertension, hyperlipidemia, type  2 diabetes, history of osteomyelitis s/p left AKA, dementia presenting to the ED after having worsening confusion at the facility. Per EDP, patient has legal guardian at DSS and per report her family should not be contacted.  Patient was admitted with altered mental status, suspected due to UTI and hypernatremia, AKI Patient was admitted with acute metabolic encephalopathy, likely due to infectious etiology and worsening dementia, workup revealed hypernatremia which improved with hypotonic fluids, was on IV antibiotics for UTI, AKI resolved with IV fluids, found to have acute blood loss anemia hemoglobin improved s/p 1 unit PRBC transfusion, blood cultures growing Bacteroides, CT abdomen pelvis done for GI source -which showed findings worrisome for cholangitis, no CBD dilation/obstructive choledocholithiasis.  Also found to have nonocclusive portal vein thrombosis, thrombosed IMV, multilobular fluid density structure worrisome for small intra-abdominal abscess, possibly due to acute diverticulitis. Patient at baseline has dementia, AO x 1 with poor quality of life.  Spoke with guardian [name], who discussed with son and decided to go ahead with comfort care and does not want to prolong poor quality of life  Acute metabolic encephalopathy -likely due to infectious etiology UTI, cholangitis, hypernatremia, dementia, multifactorial Hypernatremia Acute cholangitis Nonocclusive portal vein thrombosis Thrombosed IMV Intra-abdominal abscess, possible acute diverticulitis UTI Acute blood loss anemia  - unclear source Hypokalemia AKI Failure to thrive Dementia HTN Type II DM HLD CAD, PVD s/p left lower extremity angioplasty on  11/06/2023, history of stroke:   - Transition to comfort care  DVT prophylaxis: SCD   Code Status: Do not attempt resuscitation (DNR) - Comfort care Disposition:  TBD  Consultants:  Infectious disease  Procedures:  None  Antimicrobials:  Anti-infectives (From  admission, onward)    Start     Dose/Rate Route Frequency Ordered Stop   03/03/24 2215  piperacillin -tazobactam (ZOSYN ) IVPB 3.375 g  Status:  Discontinued        3.375 g 12.5 mL/hr over 240 Minutes Intravenous Every 8 hours 03/03/24 2123 03/04/24 1706   03/02/24 1700  cefTRIAXone  (ROCEPHIN ) 1 g in sodium chloride  0.9 % 100 mL IVPB  Status:  Discontinued        1 g 200 mL/hr over 30 Minutes Intravenous Every 24 hours 03/01/24 2337 03/03/24 2123   03/01/24 1730  cefTRIAXone  (ROCEPHIN ) 1 g in sodium chloride  0.9 % 100 mL IVPB        1 g 200 mL/hr over 30 Minutes Intravenous  Once 03/01/24 1728 03/01/24 1845       Data Reviewed: I have personally reviewed following labs and imaging studies CBC: Recent Labs  Lab 03/01/24 1654 03/02/24 0348 03/02/24 1109 03/02/24 1708 03/03/24 0151 03/03/24 0313 03/03/24 1459 03/04/24 0508  WBC 16.5* 14.3*  --   --  10.2  --   --  12.3*  HGB 8.2* 6.6*   < > 7.5* 6.1* 6.0* 8.1* 7.7*  HCT 28.3* 22.6*  --   --  21.3* 20.9*  --  24.8*  MCV 101.4* 100.9*  --   --  101.4*  --   --  93.9  PLT 413* 313  --   --  267  --   --  224   < > = values in this interval not displayed.   Basic Metabolic Panel: Recent Labs  Lab 03/01/24 1654 03/01/24 2330 03/03/24 0911 03/03/24 1459 03/03/24 2330 03/04/24 0508 03/04/24 1159  NA 161*   < > 156* 154* 150* 147* 146*  K 4.2   < > 3.5 3.2* 4.3 4.0 3.9  CL 119*   < > 121* 120* 118* 116* 113*  CO2 25   < > 26 23 25 24 24   GLUCOSE 175*   < > 253* 223* 210* 263* 213*  BUN 65*   < > 45* 42* 35* 32* 29*  CREATININE 1.36*   < > 0.81 0.78 0.67 0.69 0.73  CALCIUM  9.1   < > 8.5* 8.3* 8.2* 8.2* 7.7*  MG 3.5*  --   --  2.8*  --   --   --    < > = values in this interval not displayed.   GFR: Estimated Creatinine Clearance: 40.2 mL/min (by C-G formula based on SCr of 0.73 mg/dL). Liver Function Tests: Recent Labs  Lab 03/01/24 2330  AST 33  ALT 19  ALKPHOS 132*  BILITOT 0.5  PROT 6.7  ALBUMIN  2.7*    CBG: Recent Labs  Lab 03/03/24 1635 03/03/24 2051 03/04/24 0745 03/04/24 1159 03/04/24 1620  GLUCAP 206* 179* 234* 201* 123*    Recent Results (from the past 240 hours)  Urine Culture (for pregnant, neutropenic or urologic patients or patients with an indwelling urinary catheter)     Status: None   Collection Time: 03/01/24  4:04 PM   Specimen: Urine, Clean Catch  Result Value Ref Range Status   Specimen Description   Final    URINE, CLEAN CATCH Performed at [facility], [address] [place], [place] [address]    Special Requests   Final    NONE Performed at [facility], [address] [place], [place] [address]    Culture   Final    NO GROWTH Performed at [facility], [address], [place], [place] [address]    Report Status 03/03/2024 FINAL  Final  CULTURE, BLOOD (ROUTINE X 2) w Reflex to ID Panel     Status: None (Preliminary result)   Collection Time: 03/02/24 11:09 AM   Specimen: BLOOD LEFT ARM  Result Value Ref Range Status   Specimen Description   Final    BLOOD LEFT ARM Performed at [facility], [address] [place], [place] [address]    Special Requests   Final    BOTTLES DRAWN AEROBIC AND ANAEROBIC Blood Culture adequate volume Performed at [facility], [address] [place], [place] [address]    Culture  Setup Time   Final    GRAM NEGATIVE RODS ANAEROBIC BOTTLE ONLY CRITICAL RESULT CALLED TO, READ BACK BY AND VERIFIED WITH: ALEX CHAPPELL @2103  ON 03/03/24 SKL    Culture   Final    GRAM NEGATIVE RODS CULTURE REINCUBATED FOR BETTER GROWTH Performed at [facility], [address] [place], [place] [address]    Report Status PENDING  Incomplete  Blood Culture ID Panel (Reflexed)     Status: Abnormal   Collection Time: 03/02/24 11:09 AM  Result Value Ref Range Status   Enterococcus faecalis NOT DETECTED NOT DETECTED Final   Enterococcus Faecium NOT DETECTED NOT DETECTED Final   Listeria  monocytogenes NOT DETECTED NOT DETECTED Final   Staphylococcus species NOT DETECTED NOT DETECTED Final   Staphylococcus aureus (BCID) NOT DETECTED NOT DETECTED Final   Staphylococcus epidermidis NOT DETECTED NOT DETECTED Final   Staphylococcus lugdunensis NOT DETECTED NOT DETECTED Final   Streptococcus species NOT DETECTED NOT DETECTED Final   Streptococcus agalactiae NOT DETECTED NOT DETECTED Final   Streptococcus pneumoniae NOT DETECTED NOT DETECTED Final   Streptococcus pyogenes NOT DETECTED NOT DETECTED Final   A.calcoaceticus-baumannii NOT DETECTED NOT DETECTED Final   Bacteroides fragilis DETECTED (A) NOT DETECTED Final    Comment: CRITICAL RESULT CALLED TO, READ BACK BY AND VERIFIED WITH: ALEX CHAPPELL @2103  ON 03/03/24 SKL    Enterobacterales NOT DETECTED NOT DETECTED Final   Enterobacter cloacae complex NOT DETECTED NOT DETECTED Final   Escherichia coli NOT DETECTED NOT DETECTED Final   Klebsiella aerogenes NOT DETECTED NOT DETECTED Final   Klebsiella oxytoca NOT DETECTED NOT DETECTED Final   Klebsiella pneumoniae NOT DETECTED NOT DETECTED Final   Proteus species NOT DETECTED NOT DETECTED Final   Salmonella species NOT DETECTED NOT DETECTED Final   Serratia marcescens NOT DETECTED NOT DETECTED Final   Haemophilus influenzae NOT DETECTED NOT DETECTED Final   Neisseria meningitidis NOT DETECTED NOT DETECTED Final   Pseudomonas aeruginosa NOT DETECTED NOT DETECTED Final   Stenotrophomonas maltophilia NOT DETECTED NOT DETECTED Final   Candida albicans NOT DETECTED NOT DETECTED Final   Candida auris NOT DETECTED NOT DETECTED Final   Candida glabrata NOT DETECTED NOT DETECTED Final   Candida krusei NOT DETECTED NOT DETECTED Final   Candida parapsilosis NOT DETECTED NOT DETECTED Final   Candida tropicalis NOT DETECTED NOT DETECTED Final   Cryptococcus neoformans/gattii NOT DETECTED NOT DETECTED Final    Comment: Performed at [facility], [address] [place],  [place] [address]  MRSA Next Gen by PCR, Nasal  Status: None   Collection Time: 03/03/24 12:40 PM   Specimen: Nasal Mucosa; Nasal Swab  Result Value Ref Range Status   MRSA by PCR Next Gen NOT DETECTED NOT DETECTED Final    Comment: (NOTE) The GeneXpert MRSA Assay (FDA approved for NASAL specimens only), is one component of a comprehensive MRSA colonization surveillance program. It is not intended to diagnose MRSA infection nor to guide or monitor treatment for MRSA infections. Test performance is not FDA approved in patients less than 32 years old. Performed at [facility], [address] [place], [place] [address]      Radiology Studies:   Scheduled Meds:  mouth rinse  15 mL Mouth Rinse 4 times per day   sodium chloride  flush  3 mL Intravenous Q12H   Continuous Infusions:     LOS: 3 days  MDM: Patient is high risk for one or more organ failure.  They necessitate ongoing hospitalization for continued IV therapies and subsequent lab monitoring. Total time spent interpreting labs and vitals, reviewing the medical record, coordinating care amongst consultants and care team members, directly assessing and discussing care with the patient and/or family: 55 min Laree Lock, MD [facility]  To contact the attending physician between 7A-7P please use Epic Chat. To contact the covering physician during after hours 7P-7A, please review Amion.  03/04/2024, 5:47 PM   *This document has been created with the assistance of dictation software. Please excuse typographical errors. *   "

## 2024-03-05 ENCOUNTER — Other Ambulatory Visit: Payer: Self-pay

## 2024-03-05 DIAGNOSIS — R627 Adult failure to thrive: Secondary | ICD-10-CM | POA: Diagnosis not present

## 2024-03-05 LAB — CULTURE, BLOOD (ROUTINE X 2): Special Requests: ADEQUATE

## 2024-03-05 LAB — HAPTOGLOBIN: Haptoglobin: 361 mg/dL — ABNORMAL HIGH (ref 41–333)

## 2024-03-05 MED ORDER — GENTEAL TEARS 0.1-0.2-0.3 % OP SOLN
1.0000 [drp] | Freq: Four times a day (QID) | OPHTHALMIC | 0 refills | Status: AC | PRN
  Filled 2024-03-05: qty 15, 30d supply, fill #0

## 2024-03-05 MED ORDER — DIPHENHYDRAMINE HCL 50 MG/ML IJ SOLN: 25.0000 mg | INTRAMUSCULAR | Status: DC | PRN

## 2024-03-05 MED ORDER — MORPHINE SULFATE (CONCENTRATE) 20 MG/ML PO SOLN
20.0000 mg | ORAL | 0 refills | Status: AC | PRN
  Filled 2024-03-05: qty 15, 3d supply, fill #0

## 2024-03-05 MED ORDER — LORAZEPAM 2 MG/ML PO CONC: 1.0000 mg | ORAL | Status: DC | PRN

## 2024-03-05 MED ORDER — OXYCODONE HCL 20 MG/ML PO CONC: 5.0000 mg | ORAL | Status: DC | PRN

## 2024-03-05 MED ORDER — MORPHINE SULFATE (PF) 2 MG/ML IV SOLN: 1.0000 mg | INTRAVENOUS | Status: DC | PRN

## 2024-03-05 MED ORDER — GLYCOPYRROLATE 1 MG PO TABS
1.0000 mg | ORAL_TABLET | ORAL | 0 refills | Status: AC | PRN
  Filled 2024-03-05: qty 10, 2d supply, fill #0

## 2024-03-05 MED ORDER — HALOPERIDOL LACTATE 2 MG/ML PO CONC: 2.0000 mg | Freq: Four times a day (QID) | ORAL | Status: DC | PRN

## 2024-03-05 MED ORDER — LORAZEPAM 1 MG PO TABS: 1.0000 mg | ORAL_TABLET | ORAL | Status: DC | PRN

## 2024-03-05 MED ORDER — LORAZEPAM 2 MG/ML IJ SOLN: 1.0000 mg | INTRAMUSCULAR | Status: DC | PRN

## 2024-03-05 MED ORDER — ACETAMINOPHEN 325 MG PO TABS
650.0000 mg | ORAL_TABLET | Freq: Four times a day (QID) | ORAL | 0 refills | Status: AC | PRN
  Filled 2024-03-05: qty 20, 3d supply, fill #0

## 2024-03-05 MED ORDER — BIOTENE DRY MOUTH MT LIQD: 15.0000 mL | Freq: Two times a day (BID) | OROMUCOSAL | Status: DC

## 2024-03-05 MED ORDER — HALOPERIDOL LACTATE 5 MG/ML IJ SOLN: 2.0000 mg | Freq: Four times a day (QID) | INTRAMUSCULAR | Status: DC | PRN

## 2024-03-05 MED ORDER — HALOPERIDOL 0.5 MG PO TABS: 2.0000 mg | ORAL_TABLET | Freq: Four times a day (QID) | ORAL | Status: DC | PRN

## 2024-03-05 MED ORDER — LORAZEPAM 2 MG/ML PO CONC
1.0000 mg | ORAL | 0 refills | Status: AC | PRN
  Filled 2024-03-05: qty 30, 10d supply, fill #0

## 2024-03-05 NOTE — Progress Notes (Signed)
 Report called and given to Amanda (Nurse) at [facility]. [facility] to facility.

## 2024-03-05 NOTE — Discharge Summary (Signed)
 " Physician Discharge Summary   Patient: [name] MRN: [id number] DOB: 02/04/1934  Admit date:     03/01/2024  Discharge date: 03/05/24  Discharge Physician: [name]   PCP: [name] Norleen BIRCH, MD   Recommendations at discharge:   Follow up with Hospice provider - Comfort care  Discharge Diagnoses: Principal Problem:   Failure to thrive in adult Active Problems:   Type 2 diabetes mellitus with polyneuropathy (HCC)   Mixed hyperlipidemia   Essential hypertension   PAD (peripheral artery disease)   Dementia (HCC)   Protein-calorie malnutrition, severe   S/P AKA (above knee amputation) unilateral, left (HCC)   Anaerobic bacteremia   Septic thrombophlebitis   Colonic diverticular abscess  Hospital Course: [name] is a 89 y.o. year old female with medical history of hypertension, hyperlipidemia, type 2 diabetes, history of osteomyelitis s/p left AKA, dementia presenting to the ED after having worsening confusion at the facility. Per EDP, patient has legal guardian at DSS and per report her family should not be contacted.  Patient was admitted with altered mental status, suspected due to UTI and hypernatremia, AKI Patient was admitted with acute metabolic encephalopathy, likely due to infectious etiology, hypernatremia and worsening dementia, workup revealed hypernatremia which improved with hypotonic fluids, was on IV antibiotics for UTI, AKI resolved with IV fluids, found to have acute blood loss anemia hemoglobin improved s/p 1 unit PRBC transfusion, blood cultures growing Bacteroides, CT abdomen pelvis done for GI source -which showed findings worrisome for cholangitis, no CBD dilation/obstructive choledocholithiasis.  Also found to have nonocclusive portal vein thrombosis, thrombosed IMV, multilobular fluid density structure worrisome for small intra-abdominal abscess, possibly due to acute diverticulitis. Seen by ID.  Patient at baseline has dementia, AO x 1 with  poor quality of life.  Spoke with guardian [name], who discussed with son and decided to go ahead with comfort care and does not want to prolong poor quality of life   Acute metabolic encephalopathy -likely due to infectious etiology UTI, cholangitis, hypernatremia, dementia, multifactorial Hypernatremia Acute cholangitis Nonocclusive portal vein thrombosis Thrombosed IMV Intra-abdominal abscess, possible acute diverticulitis UTI Acute blood loss anemia  - unclear source Hypokalemia AKI Failure to thrive Dementia HTN Type II DM HLD CAD, PVD s/p left lower extremity angioplasty on 11/06/2023, history of stroke:     - Transitioned to comfort care 01/23 and discharged to [facility] Met with son [name] who was at the bedside    Pain control - [place]  Controlled Substance Reporting System database was reviewed. and patient was instructed, not to drive, operate heavy machinery, perform activities at heights, swimming or participation in water activities or provide baby-sitting services while on Pain, Sleep and Anxiety Medications; until their outpatient Physician has advised to do so again. Also recommended to not to take more than prescribed Pain, Sleep and Anxiety Medications.  Consultants: Infectious disease Procedures performed: None  Disposition: [facility] with hospice/comfort care Diet recommendation:  Discharge Diet Orders (From admission, onward)     Start     Ordered   03/05/24 0000  Diet general        03/05/24 1213            DISCHARGE MEDICATION: Allergies as of 03/05/2024       Reactions   Metformin  And Related Nausea And Vomiting        Medication List     STOP taking these medications    ascorbic acid  500 MG tablet Commonly known as: VITAMIN  C   aspirin  81 MG chewable tablet   atorvastatin  80 MG tablet Commonly known as: LIPITOR    bisacodyl  10 MG suppository Commonly known as: DULCOLAX   cholecalciferol 25 MCG (1000 UNIT)  tablet Commonly known as: VITAMIN D3   clopidogrel  75 MG tablet Commonly known as: PLAVIX    cyanocobalamin  1000 MCG tablet Commonly known as: VITAMIN B12   feeding supplement (GLUCERNA SHAKE) Liqd   feeding supplement (PRO-STAT SUGAR FREE 64) Liqd   ferrous sulfate  220 (44 Fe) MG/5ML solution   geriatric multivitamins-minerals Liqd   Glucagon  Emergency 1 MG Kit   hydrALAZINE  25 MG tablet Commonly known as: APRESOLINE    insulin  aspart 100 UNIT/ML injection Commonly known as: novoLOG    lisinopril  20 MG tablet Commonly known as: ZESTRIL    magnesium  oxide 400 (240 Mg) MG tablet Commonly known as: MAG-OX   naloxone 4 MG/0.1ML Liqd nasal spray kit Commonly known as: NARCAN   oxyCODONE  5 MG immediate release tablet Commonly known as: Oxy IR/ROXICODONE    polyethylene glycol 17 g packet Commonly known as: MIRALAX  / GLYCOLAX    Santyl  250 UNIT/GM ointment Generic drug: collagenase        TAKE these medications    acetaminophen  325 MG tablet Commonly known as: TYLENOL  Take 2 tablets (650 mg total) by mouth every 6 (six) hours as needed for mild pain (pain score 1-3), moderate pain (pain score 4-6), fever or headache.   artificial tears ophthalmic solution Place 1 drop into both eyes 4 (four) times daily as needed for dry eyes.   glycopyrrolate  1 MG tablet Commonly known as: ROBINUL  Take 1 tablet (1 mg total) by mouth every 4 (four) hours as needed (excessive secretions).   LORazepam  2 MG/ML concentrated solution Commonly known as: ATIVAN  Place 0.5 mLs (1 mg total) under the tongue every 4 (four) hours as needed for anxiety.   morphine  20 MG/ML concentrated solution Commonly known as: ROXANOL Take 1 mL (20 mg total) by mouth every 4 (four) hours as needed for severe pain (pain score 7-10) or shortness of breath.        Discharge Exam: Filed Weights   03/02/24 2014 03/03/24 0500 03/04/24 0500  Weight: 45.9 kg 50.3 kg 52.4 kg   Gen: NAD CV: normal heart  sounds Lung: CTAB Neuro: somnolent  Condition at discharge: poor  The results of significant diagnostics from this hospitalization (including imaging, microbiology, ancillary and laboratory) are listed below for reference.   Imaging Studies:   Microbiology: Results for orders placed or performed during the hospital encounter of 03/01/24  Urine Culture (for pregnant, neutropenic or urologic patients or patients with an indwelling urinary catheter)     Status: None   Collection Time: 03/01/24  4:04 PM   Specimen: Urine, Clean Catch  Result Value Ref Range Status   Specimen Description   Final    URINE, CLEAN CATCH Performed at [facility], [address], [place], [place] [address]    Special Requests   Final    NONE Performed at [facility], [address] [place], [place] [address]    Culture   Final    NO GROWTH Performed at [facility], [address] [place], [place] [address]    Report Status 03/03/2024 FINAL  Final  CULTURE, BLOOD (ROUTINE X 2) w Reflex to ID Panel     Status: None (Preliminary result)   Collection Time: 03/02/24 11:09 AM   Specimen: BLOOD LEFT ARM  Result Value Ref Range Status   Specimen  Description   Final    BLOOD LEFT ARM Performed at [facility], [address] [place], [place] [address]    Special Requests   Final    BOTTLES DRAWN AEROBIC AND ANAEROBIC Blood Culture adequate volume Performed at [facility], [address] [place], [place] [address]    Culture  Setup Time   Final    GRAM NEGATIVE RODS ANAEROBIC BOTTLE ONLY CRITICAL RESULT CALLED TO, READ BACK BY AND VERIFIED WITH: ALEX CHAPPELL @2103  ON 03/03/24 SKL    Culture   Final    GRAM NEGATIVE RODS CULTURE REINCUBATED FOR BETTER GROWTH Performed at [facility], [address], [place], [place] [address]    Report Status PENDING  Incomplete  Blood Culture ID Panel (Reflexed)     Status: Abnormal   Collection Time: 03/02/24  11:09 AM  Result Value Ref Range Status   Enterococcus faecalis NOT DETECTED NOT DETECTED Final   Enterococcus Faecium NOT DETECTED NOT DETECTED Final   Listeria monocytogenes NOT DETECTED NOT DETECTED Final   Staphylococcus species NOT DETECTED NOT DETECTED Final   Staphylococcus aureus (BCID) NOT DETECTED NOT DETECTED Final   Staphylococcus epidermidis NOT DETECTED NOT DETECTED Final   Staphylococcus lugdunensis NOT DETECTED NOT DETECTED Final   Streptococcus species NOT DETECTED NOT DETECTED Final   Streptococcus agalactiae NOT DETECTED NOT DETECTED Final   Streptococcus pneumoniae NOT DETECTED NOT DETECTED Final   Streptococcus pyogenes NOT DETECTED NOT DETECTED Final   A.calcoaceticus-baumannii NOT DETECTED NOT DETECTED Final   Bacteroides fragilis DETECTED (A) NOT DETECTED Final    Comment: CRITICAL RESULT CALLED TO, READ BACK BY AND VERIFIED WITH: ALEX CHAPPELL @2103  ON 03/03/24 SKL    Enterobacterales NOT DETECTED NOT DETECTED Final   Enterobacter cloacae complex NOT DETECTED NOT DETECTED Final   Escherichia coli NOT DETECTED NOT DETECTED Final   Klebsiella aerogenes NOT DETECTED NOT DETECTED Final   Klebsiella oxytoca NOT DETECTED NOT DETECTED Final   Klebsiella pneumoniae NOT DETECTED NOT DETECTED Final   Proteus species NOT DETECTED NOT DETECTED Final   Salmonella species NOT DETECTED NOT DETECTED Final   Serratia marcescens NOT DETECTED NOT DETECTED Final   Haemophilus influenzae NOT DETECTED NOT DETECTED Final   Neisseria meningitidis NOT DETECTED NOT DETECTED Final   Pseudomonas aeruginosa NOT DETECTED NOT DETECTED Final   Stenotrophomonas maltophilia NOT DETECTED NOT DETECTED Final   Candida albicans NOT DETECTED NOT DETECTED Final   Candida auris NOT DETECTED NOT DETECTED Final   Candida glabrata NOT DETECTED NOT DETECTED Final   Candida krusei NOT DETECTED NOT DETECTED Final   Candida parapsilosis NOT DETECTED NOT DETECTED Final   Candida tropicalis NOT DETECTED  NOT DETECTED Final   Cryptococcus neoformans/gattii NOT DETECTED NOT DETECTED Final    Comment: Performed at [facility], [address] [place], [place] [address]  MRSA Next Gen by PCR, Nasal     Status: None   Collection Time: 03/03/24 12:40 PM   Specimen: Nasal Mucosa; Nasal Swab  Result Value Ref Range Status   MRSA by PCR Next Gen NOT DETECTED NOT DETECTED Final    Comment: (NOTE) The GeneXpert MRSA Assay (FDA approved for NASAL specimens only), is one component of a comprehensive MRSA colonization surveillance program. It is not intended to diagnose MRSA infection nor to guide or monitor treatment for MRSA infections. Test performance is not FDA approved in patients less than 50 years old. Performed at [facility], [address] [place],  [place] [address]     Labs: CBC: Recent Labs  Lab 03/01/24 1654 03/02/24 0348 03/02/24 1109 03/02/24 1708 03/03/24 0151 03/03/24 0313 03/03/24 1459 03/04/24 0508  WBC 16.5* 14.3*  --   --  10.2  --   --  12.3*  HGB 8.2* 6.6*   < > 7.5* 6.1* 6.0* 8.1* 7.7*  HCT 28.3* 22.6*  --   --  21.3* 20.9*  --  24.8*  MCV 101.4* 100.9*  --   --  101.4*  --   --  93.9  PLT 413* 313  --   --  267  --   --  224   < > = values in this interval not displayed.   Basic Metabolic Panel: Recent Labs  Lab 03/01/24 1654 03/01/24 2330 03/03/24 0911 03/03/24 1459 03/03/24 2330 03/04/24 0508 03/04/24 1159  NA 161*   < > 156* 154* 150* 147* 146*  K 4.2   < > 3.5 3.2* 4.3 4.0 3.9  CL 119*   < > 121* 120* 118* 116* 113*  CO2 25   < > 26 23 25 24 24   GLUCOSE 175*   < > 253* 223* 210* 263* 213*  BUN 65*   < > 45* 42* 35* 32* 29*  CREATININE 1.36*   < > 0.81 0.78 0.67 0.69 0.73  CALCIUM  9.1   < > 8.5* 8.3* 8.2* 8.2* 7.7*  MG 3.5*  --   --  2.8*  --   --   --    < > = values in this interval not displayed.   Liver Function Tests: Recent Labs  Lab 03/01/24 2330  AST 33  ALT 19  ALKPHOS 132*  BILITOT 0.5  PROT 6.7   ALBUMIN  2.7*   CBG: Recent Labs  Lab 03/03/24 1635 03/03/24 2051 03/04/24 0745 03/04/24 1159 03/04/24 1620  GLUCAP 206* 179* 234* 201* 123*    Discharge time spent: less than 30 minutes.  Signed: Laree Lock, MD [facility] 03/05/2024 "

## 2024-03-05 NOTE — Progress Notes (Signed)
 [facility]) HOSPITAL LIAISON NOTE  Received request from Racheal Schimke, Case Manager (CM), for hospice services at LTC ([facility]) after discharge or DSS guardian is also okay with hospice [facility]   Per Racheal Schimke, [facility] can take patient back to their facility today.  Per discussion, the plan is for discharge to [facility] today with hospice referral. DME needs discussed.   Patient has the following equipment in the home:  Has what she needs at LTC facility                Please send signed and completed DNR home with patient/family if applicable.   Please provide prescriptions at discharge as needed to ensure ongoing symptom management.  Requested meds to bed of hospital medical team due to weekend and inclement weather forecasted. . Above information shared with Racheal Schimke, CM and hospital medical care team.  Please call with any hospice related questions or concerns.  Thank you for the opportunity to participate in this patient's care.  Saddie HILARIO Na, MA, BSN, RN, FNE Nurse Liaison [phone]

## 2024-03-05 NOTE — Consult Note (Signed)
 "                                                                                   Consultation Note Date: 03/05/2024 at 0850 Reason for consultation: GOC    Patient Name: [name]  DOB: [birth year]  MRN: [id number]  Age / Sex: 89 y.o., female  PCP: Rudolpho Norleen BIRCH, MD Referring Physician: Jerelene Critchley, MD  HPI/Patient Profile: 89 y.o. female  with past medical history significant for dementia, DM II, s/p L AKA, CVA and PAD. Patient presented to ED [facility] 03/01/2024 c/o unresponsiveness. EMS reported that patient was awake and alert on scene with food in her mouth. Staff reported to EMS, patient had not eaten since the day prior.    EMS labs significant for leukocytosis, Hgb 8.2. Na 161, BUN 65, creatinine 1.36, GFR 37, albumin  2.7.   ED vitals 114/68, HR 112, RR 18, SpO2 98%, 98.78F  UA with trace leukocytes, proteinuria and rare bacteria.   CXR negative  TRH was consulted for admission and management of FTT, AKI, dementia, acute encephalopathy and UTI.   Palliative medicine team consulted for assistance with goals of care conversations.   [name] is well-known to PMT after previous consults and visits dated 7/2-08/16/2022 and 9/27-9/29/2025.   Patient has DSS legal guardian who makes medical decisions on her behalf.    Summary of counseling/coordination of care Chart reviewed:      Latest Ref Rng & Units 03/04/2024    5:08 AM 03/03/2024    2:59 PM 03/03/2024    3:13 AM  CBC  WBC 4.0 - 10.5 K/uL 12.3     Hemoglobin 12.0 - 15.0 g/dL 7.7  8.1  6.0   Hematocrit 36.0 - 46.0 % 24.8   20.9   Platelets 150 - 400 K/uL 224         Latest Ref Rng & Units 03/04/2024   11:59 AM 03/04/2024    5:08 AM 03/03/2024   11:30 PM  CMP  Glucose 70 - 99 mg/dL 786  736  789   BUN 8 - 23 mg/dL 29  32  35   Creatinine 0.44 - 1.00 mg/dL 9.26  9.30  9.32   Sodium 135 - 145 mmol/L 146  147  150   Potassium 3.5 - 5.1 mmol/L 3.9  4.0  4.3   Chloride 98 - 111 mmol/L 113   116  118   CO2 22 - 32 mmol/L 24  24  25    Calcium  8.9 - 10.3 mg/dL 7.7  8.2  8.2      Blood pressure (!) 101/53, pulse 73, temperature 97.7 F (36.5 C), temperature source Oral, resp. rate 19, height 5' 4 (1.626 m), weight 52.4 kg, SpO2 96%.  Patient Active Problem List   Diagnosis Date Noted   Anaerobic bacteremia 03/04/2024   Septic thrombophlebitis 03/04/2024   Colonic diverticular abscess 03/04/2024   Failure to thrive in adult 03/01/2024   S/P AKA (above knee amputation) unilateral, left (HCC) 12/10/2023   Acute osteomyelitis of toe, left (HCC) 12/04/2023   Critical limb ischemia of left lower extremity (HCC) 11/06/2023   Ischemic foot ulcer due to  atherosclerosis of native artery of limb (HCC) 11/06/2023   Protein-calorie malnutrition, severe 04/30/2023   Gangrene of toe (HCC) 04/28/2023   Dementia (HCC) 04/09/2023   DJD (degenerative joint disease) 01/21/2023   Ischemia of left lower extremity 08/27/2022   Osteomyelitis of left foot (HCC) 08/27/2022   Gangrene of toe of left foot (HCC) 08/26/2022   Acute osteomyelitis of left foot (HCC) 08/08/2022   Diabetic foot infection (HCC) 08/07/2022   Hemiplegia affecting left side in left-dominant patient as late effect of cerebrovascular disease (HCC) 01/10/2022   Vaginal candidiasis 01/10/2022   Leg wound, left 01/08/2022   Hypoglycemia 01/07/2022   Leukocytosis 01/07/2022   MRI contraindicated due to metal implant 01/07/2022   Open wound of left foot 01/07/2022   Atherosclerosis of native arteries of the extremities with ulceration (HCC) 06/06/2021   Pressure injury of skin 06/01/2021   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   PAD (peripheral artery disease) 05/17/2021   Right hemiparesis (HCC) 05/16/2021   Cellulitis of left foot 05/15/2021   Hypokalemia 05/15/2021   History of stroke 10/07/2019   Altered mental status 10/06/2019   Carotid artery disease 09/28/2019   Left hemiparesis (HCC) 09/28/2019   Hemiplegia and  hemiparesis following cerebral infarction affecting right dominant side (HCC) 09/27/2019   Primary osteoarthritis of both hips 11/06/2017   Microalbuminuria 11/02/2017   Cystocele, midline 09/04/2017   Incomplete uterine prolapse 09/04/2017   Ankle edema 04/27/2017   Malnutrition of moderate degree 01/15/2016   Dehydration    Incarcerated ventral hernia    Diabetic ketoacidosis without coma associated with type 2 diabetes mellitus (HCC)    Type 2 diabetes mellitus with polyneuropathy (HCC) 08/06/2015   Carpal tunnel syndrome, bilateral 07/10/2015   Osteopenia of multiple sites 07/10/2015   Arthralgia of multiple sites 06/27/2015   Mixed hyperlipidemia 11/06/2014   Hypomagnesemia 11/02/2013   Essential hypertension 11/02/2013     Progress notes: Reviewed progress notes from TRH, TOC, SLP, PT/OT, ED provider/staff and nursing notes.   Imaging: CT AP 02/27/24 1. Findings worrisome for cholangitis involving right hepatic lobe segments 5, 7, and 8. No common bile duct dilation or obstructive choledocholithiasis. 2. Nonocclusive portal vein thrombosis at the portosplenic confluence. Left ovarian vein thrombosis also present. 3. Small bilateral pleural effusions with bibasilar compressive atelectasis. Anasarca. 4. Descending and sigmoid colonic diverticulosis. No changes of acute diverticulitis. 5. Small right ovarian cyst measuring 2.8 cm. A follow-up pelvic ultrasound in 6 to 12 months could be considered to document stability.  ADDENDUM:   On second review, the thrombosed vessel in the left abdomen is actually the thrombosed IMV as the ovarian vein is more medially located and widely patent. Along the distal aspect of the IMV, and just medial to the descending colon, there is a multilobular fluid density structure measuring 2.1x3.2 cm (axial 49) worrisome for a small intraabdominal abscess, possibly due to acute diverticulitis. Given the clot in the portal vein and IMV, the  branching hypodense structures in the liver are more likely to be regions of intrahepatic portal vein thrombosis in segments 5, 7, and 8 rather than biliary ductal dilation.   Additionally, the small cystic structure along the right perirectal space could represent a small, developing fluid collection or small ovarian cyst. Attention on follow up imaging recommended.     MAR: Changes made to [facility] for comfort measures only  ACP documents: DNR on file   Ill-appearing, frail, elderly female lying in bed. She awakens to verbal and tactile stimuli and with quick instance  of unintelligible speech and immediately falls back to sleep. Respirations are even and unlabored. She is in no distress.  Debby, son, at bedside and primary RN.   I introduced Palliative Medicine as specialized medical care for people living with serious illness. It focuses on providing relief from the symptoms and stress of a serious illness. The goal is to improve quality of life for both the patient and the family.  Advance directives specific to code status was addressed. Debby shares that he is aware his mother's legal guardian has transitioned patient to comfort measures only. He shares his agreement with this decision.   Education offered regarding concept specific to human mortality and the limitations of medical interventions to prolong life when the body begins to fail to thrive. Debby states he has witnessed a significant decline since his mother was hospitalized a few months ago. He shares she has not eaten in over a week. We discussed the possibility of hospice home. He states he is in agreement with what legal guardian decides is best.   Questions and concerns were addressed. The family was encouraged to call with questions or concerns.   After speaking with Candace Goble, DSS LG, via phone, she is in agreement to having patient evaluated for hospice home. Notified TOC and [facility] liaison of request. TOC states plan  in place for her to transport back to her facility today with [facility] providing hospice care.   Contacted Candace Goble and left voice mail for plan to transfer back to facility with hospice care. Spoke with son to update plan for transfer back to facility with hospice. He is appreciative of update.   Primary Decision Maker LEGAL GUARDIAN Alberta Rolling, DSS LG  Physical Exam Vitals reviewed.  Constitutional:      General: She is not in acute distress.    Appearance: She is ill-appearing.     Comments: Frail  HENT:     Mouth/Throat:     Mouth: Mucous membranes are dry.  Pulmonary:     Effort: Pulmonary effort is normal. No respiratory distress.  Musculoskeletal:     Right lower leg: No edema.     Left lower leg: No edema.  Skin:    General: Skin is warm and dry.  Neurological:     Motor: Weakness present.    Recommendations/Plan:         Focus of care is comfort and dignity allowing for natural death Utilize ordered medications to maintain comfort Plan to transfer to facility with [facility] hospice care  Palliative Assessment/Data: 10%     Thank you for this consult. Palliative medicine will continue to follow and assist holistically.   I personally spent a total of 50 minutes in the care of the patient today including preparing to see the patient, getting/reviewing separately obtained history, performing a medically appropriate exam/evaluation, counseling and educating, placing orders, referring and communicating with other health care professionals, documenting clinical information in the EHR, independently interpreting results, communicating results, and coordinating care.     Devere Sacks, AMANDA [facility] Palliative Medicine Team  03/05/2024 8:51 AM  Office [phone]  Pager [phone]     Please contact Palliative Medicine Team providers via AMION for questions and concerns.   "

## 2024-03-05 NOTE — Plan of Care (Signed)
  Problem: Education: Goal: Ability to describe self-care measures that may prevent or decrease complications (Diabetes Survival Skills Education) will improve Outcome: Progressing   Problem: Coping: Goal: Ability to adjust to condition or change in health will improve Outcome: Progressing   Problem: Fluid Volume: Goal: Ability to maintain a balanced intake and output will improve Outcome: Progressing   Problem: Metabolic: Goal: Ability to maintain appropriate glucose levels will improve Outcome: Progressing   Problem: Skin Integrity: Goal: Risk for impaired skin integrity will decrease Outcome: Progressing   

## 2024-03-05 NOTE — TOC Transition Note (Signed)
 Transition of Care [facility]) - Discharge Note   Patient Details  Name: ALEXYIA GUARINO MRN: [id number] Date of Birth: October 31, 1933  [facility] CM/SW Contact:  Racheal LITTIE Schimke, RN Phone Number: 03/05/2024, 11:45 AM    Final next level of care: Long Term Acute Care (LTAC) Barriers to Discharge: Barriers Resolved   Patient Goals and CMS Choice            Discharge Placement                    Patient and family notified of of transfer: 03/05/24  Discharge Plan and Services Additional resources added to the After Visit Summary for                  DME Arranged: N/A DME Agency: NA       HH Arranged: NA HH Agency: NA        Social Drivers of Health (SDOH) Interventions SDOH Screenings   Food Insecurity: Patient Unable To Answer (12/05/2023)  Housing: Unknown (12/05/2023)  Transportation Needs: Patient Unable To Answer (12/05/2023)  Utilities: Patient Unable To Answer (12/05/2023)  Social Connections: Patient Unable To Answer (12/05/2023)  Tobacco Use: Low Risk (03/01/2024)     Readmission Risk Interventions    03/03/2024    7:10 PM 05/01/2023    4:26 PM  Readmission Risk Prevention Plan  Transportation Screening Complete Complete  PCP or Specialist Appt within 3-5 Days --   HRI or Home Care Consult Complete Complete  Social Work Consult for Recovery Care Planning/Counseling  Complete  Palliative Care Screening Not Applicable   Medication Review Oceanographer) Complete Complete

## 2024-03-06 LAB — BLOOD GAS, VENOUS
Acid-Base Excess: 1.1 mmol/L (ref 0.0–2.0)
Bicarbonate: 25.1 mmol/L (ref 20.0–28.0)
Patient temperature: 37
pCO2, Ven: 37 mmHg — ABNORMAL LOW (ref 44–60)
pH, Ven: 7.44 — ABNORMAL HIGH (ref 7.25–7.43)
pO2, Ven: 72 mmHg — ABNORMAL HIGH (ref 32–45)

## 2024-04-10 DEATH — deceased

## 2024-05-09 ENCOUNTER — Ambulatory Visit (INDEPENDENT_AMBULATORY_CARE_PROVIDER_SITE_OTHER): Admitting: Nurse Practitioner

## 2024-05-09 ENCOUNTER — Encounter (INDEPENDENT_AMBULATORY_CARE_PROVIDER_SITE_OTHER)
